# Patient Record
Sex: Male | Born: 1940 | Race: Black or African American | Hispanic: No | Marital: Married | Smoking: Former smoker
Health system: Southern US, Community
[De-identification: ages and names within clinical notes are randomized; demographics above are authoritative.]

## PROBLEM LIST (undated history)

## (undated) DIAGNOSIS — G629 Polyneuropathy, unspecified: Secondary | ICD-10-CM

## (undated) DIAGNOSIS — K253 Acute gastric ulcer without hemorrhage or perforation: Secondary | ICD-10-CM

## (undated) DIAGNOSIS — I214 Non-ST elevation (NSTEMI) myocardial infarction: Secondary | ICD-10-CM

## (undated) DIAGNOSIS — I471 Supraventricular tachycardia, unspecified: Secondary | ICD-10-CM

## (undated) DIAGNOSIS — I428 Other cardiomyopathies: Secondary | ICD-10-CM

## (undated) DIAGNOSIS — I639 Cerebral infarction, unspecified: Secondary | ICD-10-CM

## (undated) DIAGNOSIS — K2960 Other gastritis without bleeding: Secondary | ICD-10-CM

## (undated) DIAGNOSIS — B1920 Unspecified viral hepatitis C without hepatic coma: Secondary | ICD-10-CM

## (undated) DIAGNOSIS — I10 Essential (primary) hypertension: Secondary | ICD-10-CM

## (undated) DIAGNOSIS — R7989 Other specified abnormal findings of blood chemistry: Secondary | ICD-10-CM

## (undated) DIAGNOSIS — K922 Gastrointestinal hemorrhage, unspecified: Secondary | ICD-10-CM

## (undated) DIAGNOSIS — K219 Gastro-esophageal reflux disease without esophagitis: Secondary | ICD-10-CM

## (undated) DIAGNOSIS — F1020 Alcohol dependence, uncomplicated: Secondary | ICD-10-CM

## (undated) DIAGNOSIS — R42 Dizziness and giddiness: Secondary | ICD-10-CM

## (undated) DIAGNOSIS — B370 Candidal stomatitis: Secondary | ICD-10-CM

## (undated) DIAGNOSIS — F329 Major depressive disorder, single episode, unspecified: Secondary | ICD-10-CM

## (undated) DIAGNOSIS — D649 Anemia, unspecified: Secondary | ICD-10-CM

## (undated) DIAGNOSIS — F1110 Opioid abuse, uncomplicated: Secondary | ICD-10-CM

## (undated) DIAGNOSIS — D696 Thrombocytopenia, unspecified: Secondary | ICD-10-CM

## (undated) DIAGNOSIS — C220 Liver cell carcinoma: Secondary | ICD-10-CM

## (undated) DIAGNOSIS — K760 Fatty (change of) liver, not elsewhere classified: Secondary | ICD-10-CM

## (undated) DIAGNOSIS — I452 Bifascicular block: Secondary | ICD-10-CM

## (undated) DIAGNOSIS — K7460 Unspecified cirrhosis of liver: Secondary | ICD-10-CM

## (undated) DIAGNOSIS — T401X1A Poisoning by heroin, accidental (unintentional), initial encounter: Secondary | ICD-10-CM

## (undated) DIAGNOSIS — K2970 Gastritis, unspecified, without bleeding: Secondary | ICD-10-CM

## (undated) DIAGNOSIS — I82409 Acute embolism and thrombosis of unspecified deep veins of unspecified lower extremity: Secondary | ICD-10-CM

## (undated) DIAGNOSIS — R319 Hematuria, unspecified: Secondary | ICD-10-CM

## (undated) DIAGNOSIS — Z09 Encounter for follow-up examination after completed treatment for conditions other than malignant neoplasm: Secondary | ICD-10-CM

## (undated) DIAGNOSIS — R9431 Abnormal electrocardiogram [ECG] [EKG]: Secondary | ICD-10-CM

## (undated) DIAGNOSIS — R634 Abnormal weight loss: Secondary | ICD-10-CM

## (undated) DIAGNOSIS — M25561 Pain in right knee: Secondary | ICD-10-CM

## (undated) DIAGNOSIS — F1010 Alcohol abuse, uncomplicated: Secondary | ICD-10-CM

## (undated) DIAGNOSIS — K2901 Acute gastritis with bleeding: Secondary | ICD-10-CM

## (undated) DIAGNOSIS — T39395A Adverse effect of other nonsteroidal anti-inflammatory drugs [NSAID], initial encounter: Secondary | ICD-10-CM

## (undated) DIAGNOSIS — F1910 Other psychoactive substance abuse, uncomplicated: Secondary | ICD-10-CM

## (undated) DIAGNOSIS — K8020 Calculus of gallbladder without cholecystitis without obstruction: Secondary | ICD-10-CM

## (undated) HISTORY — DX: Calculus of gallbladder without cholecystitis without obstruction: K80.20

## (undated) HISTORY — PX: TONSILLECTOMY: SUR1361

## (undated) HISTORY — DX: Acute gastric ulcer without hemorrhage or perforation: K25.3

## (undated) HISTORY — DX: Poisoning by heroin, accidental (unintentional), initial encounter: T40.1X1A

## (undated) HISTORY — DX: Abnormal weight loss: R63.4

## (undated) HISTORY — DX: Opioid abuse, uncomplicated: F11.10

## (undated) HISTORY — DX: Gastritis, unspecified, without bleeding: K29.70

## (undated) HISTORY — DX: Candidal stomatitis: B37.0

## (undated) HISTORY — DX: Other cardiomyopathies: I42.8

## (undated) HISTORY — DX: Pain in right knee: M25.561

## (undated) HISTORY — DX: Gastrointestinal hemorrhage, unspecified: K92.2

## (undated) HISTORY — DX: Acute gastritis with bleeding: K29.01

## (undated) HISTORY — DX: Major depressive disorder, single episode, unspecified: F32.9

## (undated) HISTORY — DX: Cerebral infarction, unspecified: I63.9

## (undated) HISTORY — PX: CIRCUMCISION: SUR203

## (undated) HISTORY — DX: Hematuria, unspecified: R31.9

## (undated) HISTORY — DX: Bifascicular block: I45.2

## (undated) HISTORY — DX: Encounter for follow-up examination after completed treatment for conditions other than malignant neoplasm: Z09

## (undated) HISTORY — DX: Dizziness and giddiness: R42

## (undated) HISTORY — DX: Fatty (change of) liver, not elsewhere classified: K76.0

## (undated) HISTORY — DX: Other gastritis without bleeding: K29.60

## (undated) HISTORY — DX: Alcohol dependence, uncomplicated: F10.20

## (undated) HISTORY — DX: Acute embolism and thrombosis of unspecified deep veins of unspecified lower extremity: I82.409

---

## 2008-11-02 ENCOUNTER — Emergency Department (HOSPITAL_COMMUNITY): Admission: EM | Admit: 2008-11-02 | Discharge: 2008-11-02 | Payer: Self-pay | Admitting: Emergency Medicine

## 2011-10-20 ENCOUNTER — Emergency Department (HOSPITAL_COMMUNITY): Payer: Medicare Other

## 2011-10-20 ENCOUNTER — Encounter (HOSPITAL_COMMUNITY): Payer: Self-pay | Admitting: *Deleted

## 2011-10-20 ENCOUNTER — Emergency Department (HOSPITAL_COMMUNITY)
Admission: EM | Admit: 2011-10-20 | Discharge: 2011-10-20 | Disposition: A | Discharge status: Home / Self Care | Payer: Medicare Other | Attending: Emergency Medicine | Admitting: Emergency Medicine

## 2011-10-20 DIAGNOSIS — S0990XA Unspecified injury of head, initial encounter: Secondary | ICD-10-CM

## 2011-10-20 DIAGNOSIS — Y998 Other external cause status: Secondary | ICD-10-CM | POA: Insufficient documentation

## 2011-10-20 DIAGNOSIS — Y92009 Unspecified place in unspecified non-institutional (private) residence as the place of occurrence of the external cause: Secondary | ICD-10-CM | POA: Insufficient documentation

## 2011-10-20 DIAGNOSIS — S0180XA Unspecified open wound of other part of head, initial encounter: Secondary | ICD-10-CM | POA: Insufficient documentation

## 2011-10-20 DIAGNOSIS — S62609A Fracture of unspecified phalanx of unspecified finger, initial encounter for closed fracture: Secondary | ICD-10-CM

## 2011-10-20 DIAGNOSIS — R930 Abnormal findings on diagnostic imaging of skull and head, not elsewhere classified: Secondary | ICD-10-CM | POA: Insufficient documentation

## 2011-10-20 DIAGNOSIS — S0181XA Laceration without foreign body of other part of head, initial encounter: Secondary | ICD-10-CM

## 2011-10-20 DIAGNOSIS — S62639A Displaced fracture of distal phalanx of unspecified finger, initial encounter for closed fracture: Secondary | ICD-10-CM | POA: Insufficient documentation

## 2011-10-20 DIAGNOSIS — I10 Essential (primary) hypertension: Secondary | ICD-10-CM | POA: Insufficient documentation

## 2011-10-20 DIAGNOSIS — R079 Chest pain, unspecified: Secondary | ICD-10-CM | POA: Insufficient documentation

## 2011-10-20 DIAGNOSIS — W010XXA Fall on same level from slipping, tripping and stumbling without subsequent striking against object, initial encounter: Secondary | ICD-10-CM | POA: Insufficient documentation

## 2011-10-20 DIAGNOSIS — W19XXXA Unspecified fall, initial encounter: Secondary | ICD-10-CM

## 2011-10-20 DIAGNOSIS — G589 Mononeuropathy, unspecified: Secondary | ICD-10-CM | POA: Insufficient documentation

## 2011-10-20 DIAGNOSIS — IMO0002 Reserved for concepts with insufficient information to code with codable children: Secondary | ICD-10-CM | POA: Insufficient documentation

## 2011-10-20 DIAGNOSIS — T07XXXA Unspecified multiple injuries, initial encounter: Secondary | ICD-10-CM

## 2011-10-20 HISTORY — DX: Polyneuropathy, unspecified: G62.9

## 2011-10-20 MED ORDER — OXYCODONE HCL 5 MG PO TABS: 5.0000 mg | ORAL_TABLET | ORAL | Status: AC | PRN

## 2011-10-20 NOTE — ED Provider Notes (Signed)
71 year old male states that he lost his balance at home and fell hitting his head. He is also complaining of pain in his left thumb. He is neurologically intact, but CT does show evidence of facial fractures and a small subdural hematoma. As we discussed with neurosurgery, but I suspect that he will be able to be followed as an outpatient with repeat scan to ensure no expansion of bleed.  Dione Booze, MD 10/20/11 1356

## 2011-10-20 NOTE — ED Notes (Signed)
GNF:[id number]<YQ> Expected date:10/20/11<BR> Expected time:11:53 AM<BR> Means of arrival:Ambulance<BR> Comments:<BR> EMS 11 GC, 88 yom fall w lac to head Hemorrhage controlled

## 2011-10-20 NOTE — ED Notes (Signed)
etoh on board per ems.

## 2011-10-20 NOTE — ED Notes (Signed)
Res was noted to have ETOH on board and states he had one beer.  He was taken to Xray for films and the CT for Head CT

## 2011-10-20 NOTE — ED Provider Notes (Signed)
Medical screening examination/treatment/procedure(s) were conducted as a shared visit with non-physician practitioner(s) and myself.  I personally evaluated the patient during the encounter   Dione Booze, MD 10/20/11 [phone]

## 2011-10-20 NOTE — ED Notes (Signed)
Pt arrived fully immobilized by ems. Unable to perform ekg at this time due to being strapped on LSB. Will perform when removed from immobilization.

## 2011-10-20 NOTE — ED Provider Notes (Addendum)
History     CSN: [id number]  Arrival date & time 10/20/11  1204   First MD Initiated Contact with Patient 10/20/11 1219      Chief Complaint  Patient presents with  . Fall  . Head Laceration    (Consider location/radiation/quality/duration/timing/severity/associated sxs/prior treatment) HPI Comments: Patient with fall from standing while he was opening a door at home today.  Patient fell and hit left frontal part of forehead. He sustained a laceration to that area. Patient denies feeling dizzy, having chest pain/palpitations prior to falling. He denies loss of consciousness. Patient admits to drinking one beer earlier today. Denies blurred vision, vomiting, weakness in the extremities. Patient also fell onto left side and had some pain over left lateral ribs. No shortness of breath or difficulty breathing. Has abrasions to bilateral palms sustained trying to break his fall. Denies blood thinners.   Patient is a 71 y.o. male presenting with fall and scalp laceration. The history is provided by the patient.  Fall The accident occurred 1 to 2 hours ago. The fall occurred while walking. He landed on concrete. The volume of blood lost was minimal. The point of impact was the head. There was alcohol use involved in the accident. Pertinent negatives include no numbness, no nausea, no vomiting and no headaches. Treatment on scene includes a c-collar and a backboard. He has tried nothing for the symptoms.  Head Laceration Pertinent negatives include no arthralgias, headaches, myalgias, nausea, neck pain, numbness, rash, sore throat or vomiting.    Past Medical History  Diagnosis Date  . Neuropathy   . Hypertension     History reviewed. No pertinent past surgical history.  No family history on file.  History  Substance Use Topics  . Smoking status: Never Smoker   . Smokeless tobacco: Not on file  . Alcohol Use: Yes     "couple beers a day"      Review of Systems  Constitutional:  Negative for activity change.  HENT: Negative for hearing loss, nosebleeds, sore throat, rhinorrhea and neck pain.   Eyes: Negative for photophobia, pain, redness and visual disturbance.  Respiratory: Negative for shortness of breath.   Cardiovascular: Negative for palpitations.       Positive for rib pain  Gastrointestinal: Negative for nausea and vomiting.  Genitourinary: Negative for dysuria.  Musculoskeletal: Negative for myalgias, back pain and arthralgias.  Skin: Positive for wound. Negative for rash.  Neurological: Negative for dizziness, syncope, numbness and headaches.  Psychiatric/Behavioral: Negative for confusion.    Allergies  Review of patient's allergies indicates no known allergies.  Home Medications  No current outpatient prescriptions on file.  BP 125/74  Pulse 70  Temp(Src) 97.7 F (36.5 C) (Oral)  Resp 20  SpO2 95%  Physical Exam  Nursing note and vitals reviewed. Constitutional: He is oriented to person, place, and time. He appears well-developed and well-nourished.  HENT:  Head: Normocephalic. Head is with abrasion, with contusion and with laceration. Head is without raccoon's eyes and without Battle's sign.    Right Ear: Tympanic membrane, external ear and ear canal normal. No hemotympanum.  Left Ear: Tympanic membrane, external ear and ear canal normal. No hemotympanum.  Nose: No nasal deformity, septal deviation or nasal septal hematoma.  Mouth/Throat: Uvula is midline, oropharynx is clear and moist and mucous membranes are normal.       No point tenderness with palpation over the face or cheeks except in the area of left temple where there is an abrasion, no  depressions.  Eyes: Conjunctivae and EOM are normal. Pupils are equal, round, and reactive to light. Right eye exhibits no discharge. Left eye exhibits no discharge.       The proptosis, no hyphema. Full range of motion of both eyes in all directions. No pain with movement of eyes.  Neck: Normal  range of motion. Neck supple.       No cervical spine tenderness.  Cardiovascular: Normal rate, regular rhythm and normal heart sounds.   Pulmonary/Chest: Effort normal and breath sounds normal. He exhibits tenderness.       Left lateral rib tenderness to palpation. No skin signs of trauma.   Abdominal: Soft. Bowel sounds are normal. There is no tenderness. There is no rebound and no guarding.  Musculoskeletal: He exhibits no edema.       Bilateral abrasions to palms. No lower back or middle back tenderness to palpation.  Neurological: He is alert and oriented to person, place, and time. He has normal strength and normal reflexes. He displays no tremor. No cranial nerve deficit or sensory deficit. He exhibits normal muscle tone. Coordination normal. GCS eye subscore is 4. GCS verbal subscore is 5. GCS motor subscore is 6.  Skin: Skin is warm and dry.  Psychiatric: He has a normal mood and affect.    ED Course  Procedures (including critical care time)  Labs Reviewed - No data to display Dg Ribs Unilateral W/chest Left  10/20/2011  *RADIOLOGY REPORT*  Clinical Data: Fall today with pain on left  LEFT RIBS AND CHEST - 3+ VIEW  Comparison: None.  Findings: The cardiac silhouette, mediastinum, pulmonary vasculature are within normal limits.  Both lungs are clear.  There is also a subacute fracture of the posterior left eighth rib. No pneumothorax.  IMPRESSION: There is no evidence of acute rib fracture or pneumothorax.  Original Report Authenticated By: Brandon Melnick, M.D.   Dg Cervical Spine Complete  10/20/2011  *RADIOLOGY REPORT*  Clinical Data: Pain after fall  CERVICAL SPINE - COMPLETE 4+ VIEW  Comparison: None.  Findings: The odontoid is intact and the lateral masses are well- aligned.  The AP and lateral cervical alignment are normal. Prevertebral soft tissue stripe is within normal limits.  There is no evidence of fracture.  The oblique views reveal no evidence of facet joint dislocation  bilaterally.  IMPRESSION: There is no evidence of cervical spine fracture or dislocation.  Original Report Authenticated By: Brandon Melnick, M.D.   Ct Head Wo Contrast  10/20/2011  *RADIOLOGY REPORT*  Clinical Data: Head trauma and scalp laceration secondary to a fall.  CT HEAD WITHOUT CONTRAST  Technique:  Contiguous axial images were obtained from the base of the skull through the vertex without contrast.  Comparison: 11/02/2008  Findings: There is a tiny amount of subdural blood adjacent to the anterior inferior aspect of the left frontal lobe. Brain parenchyma is normal.  No mass lesion or acute infarction.  There is an air-fluid level in the left maxillary sinus and I suspect there are fractures of the lateral and anterior walls of the left maxillary sinus although the sinus is not completely visualized.  Suggestion of a hairline fracture of the left zygomatic arch.  The remainder of the skull is intact.   IMPRESSION: 1.  Small acute subdural hemorrhage adjacent to the anterior inferior aspect of the left frontal lobe. 2.  Probable fractures of the left maxillary sinus with an air- fluid level.  Probable hairline fracture of the left zygomatic  arch.  Original Report Authenticated By: Gwynn Burly, M.D.   Dg Hand Complete Left  10/20/2011  *RADIOLOGY REPORT*  Clinical Data: Pain, fall  LEFT HAND - COMPLETE 3+ VIEW  Comparison: None  Findings: Osseous demineralization. Joint spaces preserved. Minimally displaced fracture distal phalanx left middle finger with extension into DIP joint. Nondisplaced fracture mid aspect of distal phalanx left ring finger. No additional fracture, dislocation or bone destruction.  IMPRESSION: Fractures of the distal phalanges of the left ring and middle fingers as above.  Original Report Authenticated By: Lollie Marrow, M.D.   Dg Hand Complete Right  10/20/2011  *RADIOLOGY REPORT*  Clinical Data: Fall, injury  RIGHT HAND - COMPLETE 3+ VIEW  Comparison: None  Findings:  Osseous demineralization. Deformity distal fifth metacarpal likely old healed fracture. Joint spaces preserved. Mildly displaced fracture through mid aspect of distal phalanx right thumb. No articular extension. No additional fracture, dislocation or bone destruction.  IMPRESSION: Minimally displaced fracture through the mid aspect of distal phalanx right thumb. Likely old post-traumatic deformity of distal right fifth metacarpal.  Original Report Authenticated By: Lollie Marrow, M.D.     1. Head injury   2. Multiple closed fractures of fingers   3. Abrasions of multiple sites   4. Laceration of face   5. Fall     1:49 PM Patient seen and examine, removed from LSB with help of nurses.   1:49 PM Patient was discussed with Dione Booze, MD  4:17 PM patient is stable and is appropriate for discharge to home at this time. Patient's exam has remained unchanged during this time. Neurologically he continues to be intact without any deficits or changes.  C-collar removed, no neck pain. Full ROM in neck.   During the visit I spoke with Dr. Venetia Maxon of neurosurgery who stated no followup would be needed for patient's possible subdural bleed. I informed patient that he should be rechecked by his primary care doctor in 2 days and may need a repeat CT scan. I told the patient that if he could not see his primary care physician in 2 days he should return to the emergency department for repeat scan. Patient verbalized understanding and agreed with plan.  Patient was counseled on head injury precautions and symptoms that should indicate their return to the ED.  These include severe worsening headache, vision changes, confusion, loss of consciousness, trouble walking, nausea & vomiting, or weakness/tingling in extremities.    Patient counseled on use of narcotic pain medications. Counseled not to combine these medications with others containing tylenol. Urged not to drink alcohol, drive, or perform any other  activities that requires focus while taking these medications. The patient verbalizes understanding and agrees with the plan.  Patient was informed of possible facial fractures. ENT followup was given.  Patient was informed of finger fractures and splinting and buddy tape was performed orthopedic. Orthopedic hand followup given.  Patient counseled on wound care. Small laceration to left temporal area was prepared with tissue adhesive. Patient tolerated procedure well.  LACERATION REPAIR Performed by: Carolee Rota Authorized by: Carolee Rota Consent: Verbal consent obtained. Risks and benefits: risks, benefits and alternatives were discussed Consent given by: patient Patient identity confirmed: provided demographic data Wound explored, superficial and shallow.   Laceration Location: left temple  Laceration Length: 0.5 cm  No Foreign Bodies seen or palpated  Anesthesia: none   Amount of cleaning: standard using skin cleanser and gauze scrub.   Skin closure: tissue adhesive  Patient tolerance:  Patient tolerated the procedure well with no immediate complications.   MDM  Patient with mechanical fall. No concern for cardiogenic etiology.  CT scan shows possible subdural hematoma. Patient given instructions to return to the emergency department or followup with primary care physician in the next 48 hours. Patient is neurologically intact and has remained stable throughout his stay in the emergency department.  The patient is possible facial fractures. There is no orbital blowout or proptosis/vision change. There is no hyphema. Pain control and ENT followup indicated.  Patient with multiple finger fractures. Fingers immobilized. Orthopedic followup given. Pain control given.  Laceration repaired.  Patient counseled on signs and symptoms to return. Patient verbalizes understanding and agrees with plan.  Patient had been drinking alcohol but has not been clinically  intoxicated in any time.        Carolee Rota, PA 10/20/11 1627  Carolee Rota, PA 10/20/11 1641  Carolee Rota, [place] 10/20/11 1642

## 2011-10-20 NOTE — ED Notes (Signed)
ED PA in room and assessed patient and removed spine board with neck precautions and three other people one holding his head.  His back was checked and no obvious inj noted.  His head was cleaned as much as he tolerated and wet rags were applied to try and soften the blood on his head.

## 2011-10-20 NOTE — ED Provider Notes (Signed)
Medical screening examination/treatment/procedure(s) were conducted as a shared visit with non-physician practitioner(s) and myself.  I personally evaluated the patient during the encounter   Dione Booze, MD 10/20/11 [phone]

## 2011-10-20 NOTE — ED Notes (Signed)
Dermabond placed at the bedside. 

## 2011-10-20 NOTE — ED Notes (Addendum)
Pt was walking out of house, tripped and fell approx 2.5 feet onto concrete carport. Lac noted to L eyebrow. Possible lac to L head, unable to visualize at this time due to blood. Reported by ems that pt lost approx 50cc blood. Son witnessed and both son and pt denies LOC. ems cbg 78.

## 2012-06-04 ENCOUNTER — Encounter (HOSPITAL_COMMUNITY): Payer: Self-pay | Admitting: *Deleted

## 2012-06-04 ENCOUNTER — Inpatient Hospital Stay (HOSPITAL_COMMUNITY)
Admission: EM | Admit: 2012-06-04 | Discharge: 2012-06-11 | DRG: 602 | Disposition: A | Discharge status: Skilled Nursing Facility | Payer: Medicare Other | Attending: Internal Medicine | Admitting: Internal Medicine

## 2012-06-04 DIAGNOSIS — R3129 Other microscopic hematuria: Secondary | ICD-10-CM | POA: Diagnosis present

## 2012-06-04 DIAGNOSIS — J90 Pleural effusion, not elsewhere classified: Secondary | ICD-10-CM | POA: Diagnosis not present

## 2012-06-04 DIAGNOSIS — L0390 Cellulitis, unspecified: Secondary | ICD-10-CM | POA: Diagnosis present

## 2012-06-04 DIAGNOSIS — K769 Liver disease, unspecified: Secondary | ICD-10-CM

## 2012-06-04 DIAGNOSIS — K7460 Unspecified cirrhosis of liver: Secondary | ICD-10-CM

## 2012-06-04 DIAGNOSIS — B370 Candidal stomatitis: Secondary | ICD-10-CM | POA: Diagnosis present

## 2012-06-04 DIAGNOSIS — R319 Hematuria, unspecified: Secondary | ICD-10-CM

## 2012-06-04 DIAGNOSIS — I82409 Acute embolism and thrombosis of unspecified deep veins of unspecified lower extremity: Secondary | ICD-10-CM | POA: Diagnosis present

## 2012-06-04 DIAGNOSIS — K8020 Calculus of gallbladder without cholecystitis without obstruction: Secondary | ICD-10-CM | POA: Diagnosis present

## 2012-06-04 DIAGNOSIS — E43 Unspecified severe protein-calorie malnutrition: Secondary | ICD-10-CM | POA: Diagnosis present

## 2012-06-04 DIAGNOSIS — R933 Abnormal findings on diagnostic imaging of other parts of digestive tract: Secondary | ICD-10-CM | POA: Diagnosis present

## 2012-06-04 DIAGNOSIS — K7030 Alcoholic cirrhosis of liver without ascites: Secondary | ICD-10-CM | POA: Diagnosis present

## 2012-06-04 DIAGNOSIS — K2970 Gastritis, unspecified, without bleeding: Secondary | ICD-10-CM | POA: Diagnosis not present

## 2012-06-04 DIAGNOSIS — R634 Abnormal weight loss: Secondary | ICD-10-CM

## 2012-06-04 DIAGNOSIS — L02419 Cutaneous abscess of limb, unspecified: Principal | ICD-10-CM | POA: Diagnosis present

## 2012-06-04 DIAGNOSIS — B1920 Unspecified viral hepatitis C without hepatic coma: Secondary | ICD-10-CM

## 2012-06-04 DIAGNOSIS — Z79899 Other long term (current) drug therapy: Secondary | ICD-10-CM

## 2012-06-04 DIAGNOSIS — G589 Mononeuropathy, unspecified: Secondary | ICD-10-CM | POA: Diagnosis present

## 2012-06-04 DIAGNOSIS — J189 Pneumonia, unspecified organism: Secondary | ICD-10-CM | POA: Diagnosis not present

## 2012-06-04 DIAGNOSIS — D696 Thrombocytopenia, unspecified: Secondary | ICD-10-CM | POA: Diagnosis not present

## 2012-06-04 DIAGNOSIS — B1710 Acute hepatitis C without hepatic coma: Secondary | ICD-10-CM | POA: Diagnosis present

## 2012-06-04 DIAGNOSIS — L0291 Cutaneous abscess, unspecified: Secondary | ICD-10-CM

## 2012-06-04 DIAGNOSIS — I824Y9 Acute embolism and thrombosis of unspecified deep veins of unspecified proximal lower extremity: Secondary | ICD-10-CM | POA: Diagnosis not present

## 2012-06-04 DIAGNOSIS — D649 Anemia, unspecified: Secondary | ICD-10-CM | POA: Diagnosis not present

## 2012-06-04 DIAGNOSIS — F1010 Alcohol abuse, uncomplicated: Secondary | ICD-10-CM

## 2012-06-04 DIAGNOSIS — E876 Hypokalemia: Secondary | ICD-10-CM | POA: Diagnosis not present

## 2012-06-04 DIAGNOSIS — I10 Essential (primary) hypertension: Secondary | ICD-10-CM | POA: Diagnosis present

## 2012-06-04 DIAGNOSIS — K2990 Gastroduodenitis, unspecified, without bleeding: Secondary | ICD-10-CM | POA: Diagnosis not present

## 2012-06-04 DIAGNOSIS — F1020 Alcohol dependence, uncomplicated: Secondary | ICD-10-CM | POA: Diagnosis present

## 2012-06-04 DIAGNOSIS — D684 Acquired coagulation factor deficiency: Secondary | ICD-10-CM | POA: Diagnosis not present

## 2012-06-04 HISTORY — DX: Unspecified cirrhosis of liver: K74.60

## 2012-06-04 HISTORY — DX: Hematuria, unspecified: R31.9

## 2012-06-04 HISTORY — DX: Unspecified viral hepatitis C without hepatic coma: B19.20

## 2012-06-04 HISTORY — DX: Abnormal weight loss: R63.4

## 2012-06-04 LAB — CBC WITH DIFFERENTIAL/PLATELET
Basophils Absolute: 0.1 10*3/uL (ref 0.0–0.1)
Eosinophils Relative: 2 % (ref 0–5)
Lymphocytes Relative: 19 % (ref 12–46)
Lymphs Abs: 1.5 10*3/uL (ref 0.7–4.0)
MCV: 88.6 fL (ref 78.0–100.0)
Neutro Abs: 5.8 10*3/uL (ref 1.7–7.7)
Neutrophils Relative %: 72 % (ref 43–77)
Platelets: 152 10*3/uL (ref 150–400)
RBC: 4.3 MIL/uL (ref 4.22–5.81)
RDW: 14.5 % (ref 11.5–15.5)
WBC: 8.1 10*3/uL (ref 4.0–10.5)

## 2012-06-04 LAB — URINALYSIS, ROUTINE W REFLEX MICROSCOPIC
Ketones, ur: 15 mg/dL — AB
Specific Gravity, Urine: 1.022 (ref 1.005–1.030)
pH: 5.5 (ref 5.0–8.0)

## 2012-06-04 LAB — COMPREHENSIVE METABOLIC PANEL
ALT: 15 U/L (ref 0–53)
AST: 37 U/L (ref 0–37)
Alkaline Phosphatase: 61 U/L (ref 39–117)
CO2: 19 mEq/L (ref 19–32)
Calcium: 8.5 mg/dL (ref 8.4–10.5)
GFR calc non Af Amer: >90 mL/min (ref >90)
Potassium: 3.7 mEq/L (ref 3.5–5.1)
Sodium: 133 mEq/L — ABNORMAL LOW (ref 135–145)

## 2012-06-04 LAB — PROTIME-INR
INR: 1.37 (ref 0.00–1.49)
Prothrombin Time: 17.1 seconds — ABNORMAL HIGH (ref 11.6–15.2)

## 2012-06-04 MED ORDER — VANCOMYCIN HCL IN DEXTROSE 1-5 GM/200ML-% IV SOLN
1000.0000 mg | Freq: Once | INTRAVENOUS | Status: AC
  Administered 2012-06-04: 1000 mg via INTRAVENOUS
  Filled 2012-06-04: qty 200

## 2012-06-04 NOTE — ED Notes (Signed)
Patient transported to CT 

## 2012-06-04 NOTE — ED Provider Notes (Signed)
History     CSN: [id number]  Arrival date & time 06/04/12  1353   First MD Initiated Contact with Patient 06/04/12 2045      Chief Complaint  Patient presents with  . Leg Swelling    (Consider location/radiation/quality/duration/timing/severity/associated sxs/prior treatment) The history is provided by the patient and a friend.   patient here with worsening right lower extremity swelling and redness without fever or chills at home. Patient notes difficulty and bleeding secondary to the side of his leg. Notes decreased ability to take care of himself. Was discharged from the [facility] a week ago after admission for weakness without diagnosis. He denies any cough or shortness of breath. Denies any orthopnea. Symptoms are worse today relating and better with remaining still. No medications used for this prior to arrival  Past Medical History  Diagnosis Date  . Neuropathy   . Hypertension     History reviewed. No pertinent past surgical history.  No family history on file.  History  Substance Use Topics  . Smoking status: Never Smoker   . Smokeless tobacco: Not on file  . Alcohol Use: Yes     "couple beers a day"      Review of Systems  All other systems reviewed and are negative.    Allergies  Review of patient's allergies indicates no known allergies.  Home Medications   Current Outpatient Rx  Name Route Sig Dispense Refill  . FUROSEMIDE 20 MG PO TABS Oral Take 20 mg by mouth daily.    Marland Kitchen GABAPENTIN PO Oral Take 1 capsule by mouth 3 (three) times daily. Pt does not know the dosage of medication. Patient uses the [place] [facility]. Cant confirm dosage.    Marland Kitchen SYNTHROID PO Oral Take 1 tablet by mouth daily. Pt doesn't know dose of medication. He uses the [place] pharmacy so pharmacy cant confirm dosage.    Marland Kitchen OMEPRAZOLE 20 MG PO CPDR Oral Take 20 mg by mouth daily.    . TRAMADOL HCL 50 MG PO TABS Oral Take 50 mg by mouth every 6 (six) hours as needed. pain    . TRAZODONE HCL 100  MG PO TABS Oral Take 100 mg by mouth at bedtime.      BP 113/73  Pulse 92  Temp 97.5 F (36.4 C) (Oral)  Resp 16  SpO2 100%  Physical Exam  Nursing note and vitals reviewed. Constitutional: He is oriented to person, place, and time. He appears well-developed and well-nourished.  Non-toxic appearance. No distress.  HENT:  Head: Normocephalic and atraumatic.  Eyes: Conjunctivae, EOM and lids are normal. Pupils are equal, round, and reactive to light.  Neck: Normal range of motion. Neck supple. No tracheal deviation present. No mass present.  Cardiovascular: Normal rate, regular rhythm and normal heart sounds.  Exam reveals no gallop.   No murmur heard. Pulmonary/Chest: Effort normal and breath sounds normal. No stridor. No respiratory distress. He has no decreased breath sounds. He has no wheezes. He has no rhonchi. He has no rales.  Abdominal: Soft. Normal appearance and bowel sounds are normal. He exhibits no distension. There is no tenderness. There is no rebound and no CVA tenderness.  Musculoskeletal: Normal range of motion. He exhibits no edema and no tenderness.       Right lower extremity with 3+ edema with erythema from the knee down to the ankle. Some weeping noted. Warm to the touch. No crepitus. Dorsalis pedis pulse palpable.  Neurological: He is alert and oriented to person,  place, and time. He has normal strength. No cranial nerve deficit or sensory deficit. GCS eye subscore is 4. GCS verbal subscore is 5. GCS motor subscore is 6.  Skin: Skin is warm and dry. No abrasion and no rash noted.  Psychiatric: He has a normal mood and affect. His speech is normal and behavior is normal.    ED Course  Procedures (including critical care time)  Labs Reviewed  CBC WITH DIFFERENTIAL - Abnormal; Notable for the following:    HCT 38.1 (*)     All other components within normal limits  COMPREHENSIVE METABOLIC PANEL - Abnormal; Notable for the following:    Sodium 133 (*)      Glucose, Bld 110 (*)     BUN 4 (*)     Albumin 2.4 (*)     All other components within normal limits  URINALYSIS, ROUTINE W REFLEX MICROSCOPIC   No results found.   No diagnosis found.    MDM  Patient's right lower extremity with cellulitis. Will start patient on vancomycin and will be admitted to the hospital        Toy Baker, MD 06/04/12 2138

## 2012-06-04 NOTE — H&P (Signed)
PCP:  Nathan Frank   Chief Complaint:   Leg swelling  HPI: Nathan Frank is a 71 y.o. male   has a past medical history of Neuropathy; Hypertension; and Hepatitis C.   Presented with history of hepatitis C. with liver disease as well as alcohol abuse followed by Nathan Frank For the past 2 days his right leg has been swollen severely with skin broken and leaking fluid. NO fever or chills.NO shortness of breath. He has hx of hep C and has been an alcoholic for decades. HE wants to quit drinking denies hx of DT's. He notes easy bruising. NO blood in stools no hematemesis.  HE had EGD in the past negative for varices per patient. He have had leg swelling off and on for long time. Reports at some point he used to be on spironolactone but not sure if he is now. HE has been losing a lot of weight and has no appetite. His last servalence [place] has been a year ago.   Review of Systems:    Pertinent positives include: weight loss  loss of appetite,  Bilateral lower extremity swelling   Constitutional:  No weight loss, night sweats, Fevers, chills, fatigue, HEENT:  No headaches, Difficulty swallowing,Tooth/dental problems,Sore throat,  No sneezing, itching, ear ache, nasal congestion, post nasal drip,  Cardio-vascular:  No chest pain, Orthopnea, PND, anasarca, dizziness, palpitations.no GI:  No heartburn, indigestion, abdominal pain, nausea, vomiting, diarrhea, change in bowel habits,melena, blood in stool, hematemesis Resp:  no shortness of breath at rest. No dyspnea on exertion, No excess mucus, no productive cough, No non-productive cough, No coughing up of blood.No change in color of mucus.No wheezing. Skin:  no rash or lesions. No jaundice GU:  no dysuria, change in color of urine, no urgency or frequency. No straining to urinate.  No flank pain.  Musculoskeletal:  No joint pain or no joint swelling. No decreased range of motion. No back pain.  Psych:  No change in mood or  affect. No depression or anxiety. No memory loss.  Neuro: no localizing neurological complaints, no tingling, no weakness, no double vision, no gait abnormality, no slurred speech, no confusion  Otherwise ROS are negative except for above, 10 systems were reviewed  Past Medical History: Past Medical History  Diagnosis Date  . Neuropathy   . Hypertension   . Hepatitis C    History reviewed. No pertinent past surgical history.   Medications: Prior to Admission medications   Medication Sig Start Date End Date Taking? Authorizing Provider  furosemide (LASIX) 20 MG tablet Take 20 mg by mouth daily.   Yes Historical Provider, MD  GABAPENTIN PO Take 1 capsule by mouth 3 (three) times daily. Pt does not know the dosage of medication. Patient uses the [place] pharmacy. Cant confirm dosage.   Yes Historical Provider, MD  Levothyroxine Sodium (SYNTHROID PO) Take 1 tablet by mouth daily. Pt doesn't know dose of medication. He uses the [place] pharmacy so pharmacy cant confirm dosage.   Yes Historical Provider, MD  omeprazole (PRILOSEC) 20 MG capsule Take 20 mg by mouth daily.   Yes Historical Provider, MD  traMADol (ULTRAM) 50 MG tablet Take 50 mg by mouth every 6 (six) hours as needed. pain   Yes Historical Provider, MD  traZODone (DESYREL) 100 MG tablet Take 100 mg by mouth at bedtime.   Yes Historical Provider, MD    Allergies:  No Known Allergies  Social History:  Ambulatory walker cane Lives at   [facility]  alone   reports that he has never smoked. He does not have any smokeless tobacco history on file. He reports that he drinks alcohol. He reports that he does not use illicit drugs.   Family History: family history includes Prostate cancer in his brother and Stroke in his father.    Physical Exam: Patient Vitals for the past 24 hrs:  BP Temp Temp src Pulse Resp SpO2  06/04/12 2120 119/75 mmHg 98.3 F (36.8 C) Oral - 16  100 %  06/04/12 1923 113/73 mmHg 97.5 F (36.4 C) Oral 92  16  100 %    06/04/12 1400 108/82 mmHg 98.1 F (36.7 C) Oral 100  18  100 %    1. General:  in No Acute distress cachectic appearing 2. Psychological: Alert and  Oriented 3. Head/ENT:    Dry Mucous Membranes                          Head Non traumatic, neck supple                         Poor Dentition 4. SKIN: decreased Skin turgor,  Skin clean Dry over right tibial area skin is broken due to tense edema, warmth present serosanguineous discharge present 5. Heart: Regular rate and rhythm no Murmur, Rub or gallop 6. Lungs: Clear to auscultation bilaterally, no wheezes or crackles   7. Abdomen: Soft, non-tender, slightly distended 8. Lower extremities: no clubbing, cyanosis, right 3+ left 1+ edema 9. Neurologically Grossly intact, moving all 4 extremities equally very mild tremor no asterixis 10. MSK: Normal range of motion  body mass index is unknown because there is no height or weight on file.   Labs on Admission:   [facility] 06/04/12 1455  NA 133*  K 3.7  CL 102  CO2 19  GLUCOSE 110*  BUN 4*  CREATININE 0.61  CALCIUM 8.5  MG --  PHOS --    Basename 06/04/12 1455  AST 37  ALT 15  ALKPHOS 61  BILITOT 0.8  PROT 7.1  ALBUMIN 2.4*   No results found for this basename: LIPASE:2,AMYLASE:2 in the last 72 hours  Basename 06/04/12 1455  WBC 8.1  NEUTROABS 5.8  HGB 13.1  HCT 38.1*  MCV 88.6  PLT 152   No results found for this basename: CKTOTAL:3,CKMB:3,CKMBINDEX:3,TROPONINI:3 in the last 72 hours No results found for this basename: TSH,T4TOTAL,FREET3,T3FREE,THYROIDAB in the last 72 hours No results found for this basename: VITAMINB12:2,FOLATE:2,FERRITIN:2,TIBC:2,IRON:2,RETICCTPCT:2 in the last 72 hours No results found for this basename: HGBA1C    CrCl is unknown because there is no height on file for the current visit. ABG No results found for this basename: phart, pco2, po2, hco3, tco2, acidbasedef, o2sat     No results found for this basename: DDIMER     ST&T Change:    UA no evidence of infection, hematuria present   Cultures: No results found for this basename: sdes, specrequest, cult, reptstatus       Radiological Exams on Admission: No results found.  Chart has been reviewed  Assessment/Plan  This is 71 year old gentleman a history of liver disease due to hepatitis C and alcohol abuse here for progressive low extremity edema with right lower extremity likely affected by cellulitis  Present on Admission:  .Cellulitis - in the setting of severe low extremity edema likely secondary to hypoalbuminemia. But will treat with vancomycin given his likelihood of secondary infection. Given  that there is a disparity in size of low extremities to obtain Dopplers to rule out DVT  .Alcohol abuse patient is interested in quitting the rectocele protocol  -  .Weight loss   -  patient is at high risk for malignancy we'll obtain ultrasound of the liver to evaluate for hepatocellular cancer and carcinoma given history of hepatitis C, also he has hematurea should be further evaluated .Hepatitis C - patient is currently not being treated second alcohol abuse  .Liver disease - MELD score of 4, is reassuring, have spoken to him about importance of quitting drinking.  .Hematuria - will repeat UA if persists he may benefit from and and urology work up, ordered US of the abdomen  to start with but further imaging may be needed    Prophylaxis: hep [place], Protonix  CODE STATUS: FULL CODE  Other plan as per orders.  I have spent a total of 60 min on this admission  Ranesha Val 06/04/2012, 10:36 PM

## 2012-06-04 NOTE — ED Notes (Signed)
[facility] with bilateral leg swelling and worse to right LEG with redness and draining.  Pt is pale and inner eyelids pale, denies blood loss anywhere.  Pt knows year but not month

## 2012-06-05 ENCOUNTER — Inpatient Hospital Stay (HOSPITAL_COMMUNITY): Payer: Medicare Other

## 2012-06-05 DIAGNOSIS — M79609 Pain in unspecified limb: Secondary | ICD-10-CM

## 2012-06-05 DIAGNOSIS — B370 Candidal stomatitis: Secondary | ICD-10-CM

## 2012-06-05 DIAGNOSIS — M7989 Other specified soft tissue disorders: Secondary | ICD-10-CM

## 2012-06-05 HISTORY — DX: Candidal stomatitis: B37.0

## 2012-06-05 LAB — CREATININE, URINE, RANDOM: Creatinine, Urine: 197.98 mg/dL

## 2012-06-05 LAB — HIV ANTIBODY (ROUTINE TESTING W REFLEX): HIV: NONREACTIVE

## 2012-06-05 LAB — COMPREHENSIVE METABOLIC PANEL
BUN: 4 mg/dL — ABNORMAL LOW (ref 6–23)
CO2: 23 mEq/L (ref 19–32)
Calcium: 8.1 mg/dL — ABNORMAL LOW (ref 8.4–10.5)
Creatinine, Ser: 0.57 mg/dL (ref 0.50–1.35)
GFR calc Af Amer: >90 mL/min (ref >90)
GFR calc non Af Amer: >90 mL/min (ref >90)
Glucose, Bld: 91 mg/dL (ref 70–99)
Sodium: 137 mEq/L (ref 135–145)
Total Protein: 5.7 g/dL — ABNORMAL LOW (ref 6.0–8.3)

## 2012-06-05 LAB — CBC
HCT: 23.1 % — ABNORMAL LOW (ref 39.0–52.0)
Hemoglobin: 8.2 g/dL — ABNORMAL LOW (ref 13.0–17.0)
WBC: 3.9 10*3/uL — ABNORMAL LOW (ref 4.0–10.5)

## 2012-06-05 LAB — MAGNESIUM: Magnesium: 1.1 mg/dL — ABNORMAL LOW (ref 1.5–2.5)

## 2012-06-05 LAB — PREALBUMIN: Prealbumin: 4.9 mg/dL — ABNORMAL LOW (ref 17.0–34.0)

## 2012-06-05 LAB — AFP TUMOR MARKER: AFP-Tumor Marker: 3.5 ng/mL (ref 0.0–8.0)

## 2012-06-05 LAB — NA AND K (SODIUM & POTASSIUM), RAND UR: Sodium, Ur: <10 mEq/L

## 2012-06-05 MED ORDER — IOHEXOL 300 MG/ML  SOLN
80.0000 mL | Freq: Once | INTRAMUSCULAR | Status: AC | PRN
  Administered 2012-06-05: 80 mL via INTRAVENOUS

## 2012-06-05 MED ORDER — LEVOTHYROXINE SODIUM 50 MCG PO TABS
50.0000 ug | ORAL_TABLET | Freq: Every day | ORAL | Status: DC
  Administered 2012-06-05 – 2012-06-11 (×7): 50 ug via ORAL
  Filled 2012-06-05 (×9): qty 1

## 2012-06-05 MED ORDER — SODIUM CHLORIDE 0.9 % IJ SOLN
3.0000 mL | Freq: Two times a day (BID) | INTRAMUSCULAR | Status: DC
  Administered 2012-06-07 – 2012-06-10 (×5): 3 mL via INTRAVENOUS

## 2012-06-05 MED ORDER — MORPHINE SULFATE 2 MG/ML IJ SOLN
2.0000 mg | INTRAMUSCULAR | Status: DC | PRN
  Administered 2012-06-09: 2 mg via INTRAVENOUS
  Filled 2012-06-05: qty 1

## 2012-06-05 MED ORDER — SODIUM CHLORIDE 0.9 % IV SOLN: 250.0000 mL | INTRAVENOUS | Status: DC | PRN

## 2012-06-05 MED ORDER — LORAZEPAM 1 MG PO TABS
1.0000 mg | ORAL_TABLET | Freq: Four times a day (QID) | ORAL | Status: AC | PRN
  Administered 2012-06-05 – 2012-06-07 (×2): 1 mg via ORAL
  Filled 2012-06-05 (×4): qty 1

## 2012-06-05 MED ORDER — TRAZODONE HCL 100 MG PO TABS
100.0000 mg | ORAL_TABLET | Freq: Every day | ORAL | Status: DC
Start: 2012-06-05 — End: 2012-06-11
  Administered 2012-06-05 – 2012-06-10 (×7): 100 mg via ORAL
  Filled 2012-06-05 (×8): qty 1

## 2012-06-05 MED ORDER — HEPARIN BOLUS VIA INFUSION
2500.0000 [IU] | Freq: Once | INTRAVENOUS | Status: DC
  Filled 2012-06-05: qty 2500

## 2012-06-05 MED ORDER — FUROSEMIDE 20 MG PO TABS
20.0000 mg | ORAL_TABLET | Freq: Two times a day (BID) | ORAL | Status: DC
  Administered 2012-06-05 – 2012-06-10 (×3): 20 mg via ORAL
  Filled 2012-06-05 (×16): qty 1

## 2012-06-05 MED ORDER — PIPERACILLIN-TAZOBACTAM 3.375 G IVPB
3.3750 g | Freq: Three times a day (TID) | INTRAVENOUS | Status: DC
  Administered 2012-06-05 – 2012-06-09 (×12): 3.375 g via INTRAVENOUS
  Filled 2012-06-05 (×15): qty 50

## 2012-06-05 MED ORDER — ONDANSETRON HCL 4 MG PO TABS: 4.0000 mg | ORAL_TABLET | Freq: Four times a day (QID) | ORAL | Status: DC | PRN

## 2012-06-05 MED ORDER — HEPARIN BOLUS VIA INFUSION
3000.0000 [IU] | Freq: Once | INTRAVENOUS | Status: DC
  Filled 2012-06-05: qty 3000

## 2012-06-05 MED ORDER — THIAMINE HCL 100 MG/ML IJ SOLN
100.0000 mg | Freq: Every day | INTRAMUSCULAR | Status: DC
  Filled 2012-06-05 (×7): qty 1

## 2012-06-05 MED ORDER — LORAZEPAM 1 MG PO TABS
0.0000 mg | ORAL_TABLET | Freq: Four times a day (QID) | ORAL | Status: AC
  Administered 2012-06-05: 1 mg via ORAL

## 2012-06-05 MED ORDER — FOLIC ACID 1 MG PO TABS
1.0000 mg | ORAL_TABLET | Freq: Every day | ORAL | Status: DC
  Administered 2012-06-05 – 2012-06-11 (×7): 1 mg via ORAL
  Filled 2012-06-05 (×8): qty 1

## 2012-06-05 MED ORDER — HEPARIN (PORCINE) IN NACL 100-0.45 UNIT/ML-% IJ SOLN
1250.0000 [IU]/h | INTRAMUSCULAR | Status: DC
  Administered 2012-06-05: 1000 [IU]/h via INTRAVENOUS
  Administered 2012-06-06: 1250 [IU]/h via INTRAVENOUS
  Filled 2012-06-05 (×3): qty 250

## 2012-06-05 MED ORDER — VITAMIN B-1 100 MG PO TABS
100.0000 mg | ORAL_TABLET | Freq: Every day | ORAL | Status: DC
  Administered 2012-06-05 – 2012-06-11 (×7): 100 mg via ORAL
  Filled 2012-06-05 (×7): qty 1

## 2012-06-05 MED ORDER — GUAIFENESIN-DM 100-10 MG/5ML PO SYRP: 5.0000 mL | ORAL_SOLUTION | ORAL | Status: DC | PRN

## 2012-06-05 MED ORDER — VANCOMYCIN HCL 1000 MG IV SOLR
750.0000 mg | Freq: Two times a day (BID) | INTRAVENOUS | Status: DC
  Administered 2012-06-05 – 2012-06-08 (×8): 750 mg via INTRAVENOUS
  Filled 2012-06-05 (×10): qty 750

## 2012-06-05 MED ORDER — SODIUM CHLORIDE 0.9 % IJ SOLN: 3.0000 mL | INTRAMUSCULAR | Status: DC | PRN

## 2012-06-05 MED ORDER — DOCUSATE SODIUM 100 MG PO CAPS
100.0000 mg | ORAL_CAPSULE | Freq: Two times a day (BID) | ORAL | Status: DC
  Administered 2012-06-05 – 2012-06-11 (×14): 100 mg via ORAL
  Filled 2012-06-05 (×14): qty 1

## 2012-06-05 MED ORDER — HEPARIN SODIUM (PORCINE) 5000 UNIT/ML IJ SOLN
5000.0000 [IU] | Freq: Three times a day (TID) | INTRAMUSCULAR | Status: DC
  Administered 2012-06-05 (×2): 5000 [IU] via SUBCUTANEOUS
  Filled 2012-06-05 (×4): qty 1

## 2012-06-05 MED ORDER — PANTOPRAZOLE SODIUM 40 MG PO TBEC
40.0000 mg | DELAYED_RELEASE_TABLET | Freq: Every day | ORAL | Status: DC
  Administered 2012-06-05 – 2012-06-11 (×7): 40 mg via ORAL
  Filled 2012-06-05 (×7): qty 1

## 2012-06-05 MED ORDER — ADULT MULTIVITAMIN W/MINERALS CH
1.0000 | ORAL_TABLET | Freq: Every day | ORAL | Status: DC
Start: 2012-06-05 — End: 2012-06-11
  Administered 2012-06-05 – 2012-06-11 (×7): 1 via ORAL
  Filled 2012-06-05 (×7): qty 1

## 2012-06-05 MED ORDER — ALBUTEROL SULFATE (5 MG/ML) 0.5% IN NEBU: 2.5000 mg | INHALATION_SOLUTION | RESPIRATORY_TRACT | Status: DC | PRN

## 2012-06-05 MED ORDER — FLUCONAZOLE 100 MG PO TABS
100.0000 mg | ORAL_TABLET | Freq: Every day | ORAL | Status: AC
  Administered 2012-06-05 – 2012-06-11 (×7): 100 mg via ORAL
  Filled 2012-06-05 (×8): qty 1

## 2012-06-05 MED ORDER — IOHEXOL 300 MG/ML  SOLN
20.0000 mL | INTRAMUSCULAR | Status: AC
  Administered 2012-06-05 (×2): 20 mL via ORAL

## 2012-06-05 MED ORDER — LORAZEPAM 1 MG PO TABS: 0.0000 mg | ORAL_TABLET | Freq: Two times a day (BID) | ORAL | Status: AC

## 2012-06-05 MED ORDER — LORAZEPAM 2 MG/ML IJ SOLN: 1.0000 mg | Freq: Four times a day (QID) | INTRAMUSCULAR | Status: AC | PRN

## 2012-06-05 MED ORDER — SPIRONOLACTONE 12.5 MG HALF TABLET
12.5000 mg | ORAL_TABLET | Freq: Every day | ORAL | Status: DC
  Administered 2012-06-05 – 2012-06-11 (×7): 12.5 mg via ORAL
  Filled 2012-06-05 (×7): qty 1

## 2012-06-05 MED ORDER — ALUM & MAG HYDROXIDE-SIMETH 200-200-20 MG/5ML PO SUSP: 30.0000 mL | Freq: Four times a day (QID) | ORAL | Status: DC | PRN

## 2012-06-05 MED ORDER — ONDANSETRON HCL 4 MG/2ML IJ SOLN: 4.0000 mg | Freq: Four times a day (QID) | INTRAMUSCULAR | Status: DC | PRN

## 2012-06-05 MED ORDER — TRAMADOL HCL 50 MG PO TABS
50.0000 mg | ORAL_TABLET | Freq: Four times a day (QID) | ORAL | Status: DC | PRN
  Administered 2012-06-06 – 2012-06-11 (×6): 50 mg via ORAL
  Filled 2012-06-05 (×6): qty 1

## 2012-06-05 NOTE — Progress Notes (Signed)
ANTIBIOTIC CONSULT NOTE - INITIAL  Pharmacy Consult for vancomycin Indication: cellulitis  No Known Allergies  Patient Measurements: Weight: 156 lb (70.761 kg)  Vital Signs: Temp: 98.7 F (37.1 C) (09/01 0010) Temp src: Oral (09/01 0010) BP: 117/70 mmHg (09/01 0010) Pulse Rate: 90  (09/01 0010)  Labs:  Basename 06/04/12 1455  WBC 8.1  HGB 13.1  PLT 152  LABCREA --  CREATININE 0.61    Microbiology: No results found for this or any previous visit (from the past 720 hour(s)).  Medical History: Past Medical History  Diagnosis Date  . Neuropathy   . Hypertension   . Hepatitis C     Medications:  Prescriptions prior to admission  Medication Sig Dispense Refill  . furosemide (LASIX) 20 MG tablet Take 20 mg by mouth daily.      Marland Kitchen GABAPENTIN PO Take 1 capsule by mouth 3 (three) times daily. Pt does not know the dosage of medication. Patient uses the [place] pharmacy. Cant confirm dosage.      . Levothyroxine Sodium (SYNTHROID PO) Take 1 tablet by mouth daily. Pt doesn't know dose of medication. He uses the [place] pharmacy so pharmacy cant confirm dosage.      Marland Kitchen omeprazole (PRILOSEC) 20 MG capsule Take 20 mg by mouth daily.      . traMADol (ULTRAM) 50 MG tablet Take 50 mg by mouth every 6 (six) hours as needed. pain      . traZODone (DESYREL) 100 MG tablet Take 100 mg by mouth at bedtime.       Scheduled:    . docusate sodium  100 mg Oral BID  . folic acid  1 mg Oral Daily  . furosemide  20 mg Oral BID  . heparin  5,000 Units Subcutaneous Q8H  . levothyroxine  50 mcg Oral QAC breakfast  . LORazepam  0-4 mg Oral Q6H   Followed by  . LORazepam  0-4 mg Oral Q12H  . multivitamin with minerals  1 tablet Oral Daily  . pantoprazole  40 mg Oral Q1200  . sodium chloride  3 mL Intravenous Q12H  . sodium chloride  3 mL Intravenous Q12H  . spironolactone  12.5 mg Oral Daily  . thiamine  100 mg Oral Daily   Or  . thiamine  100 mg Intravenous Daily  . traZODone  100 mg Oral QHS  .  vancomycin  750 mg Intravenous Q12H  . vancomycin  1,000 mg Intravenous Once    Assessment: 71yo male c/o LE swelling likely due to hypoabuminemia, to be tx'd w/ ABX in case infection present.  Goal of Therapy:  Vancomycin trough level 10-15 mcg/ml  Plan:  Rec'd vanc 1g in ED; will continue with vancomycin 750mg  IV Q12H and monitor CBC, Cx, levels prn.  Colleen Can PharmD BCPS 06/05/2012,12:32 AM

## 2012-06-05 NOTE — Progress Notes (Signed)
[facility] PROGRESS NOTE  Nathan Frank ZOX:[id number] DOB: 06-21-1941 DOA: 06/04/2012 PCP: Pcp Not In System  Assessment/Plan: Active Problems:  Cellulitis  Alcohol abuse  Weight loss  Hepatitis C  Liver disease  Hematuria    1. RLE Cellulitis: Patient presented with increased swelling, pain and redness of RLE, in the setting of severe low extremity edema, likely secondary to hypoalbuminemia. Commenced on iv Vancomycin, now day#2. Given disparity in ciorcumference of lower extremities, have ordered venous doppler to rule out DVT. For broader coverage, have added iv Zosyn to antibiotic regimen.   2. Alcohol abuse: Patient is a chronic alcoholic, and has expressed interest in quitting. He has been placed on CIWA protocol, vitamin supplements and counseled.  3. Weight loss: Patient has lost about 20 pounds in the last one month. He has a known history of chronic hepatitis C/chronic liver disease. At this point, it is not clear whether he is actually cirrhotic. He is certainly at risk for malignancy this will be evaluated with ultrasound of the liver and AFP. Tumor marker. Will likely benefit from abdominal CT scan. Per patient, he has had EGD in the past, which was negative for varices.  4. Hepatitis C: Patient is currently not being treated secondary to alcohol abuse.  5. Hematuria: Urinalysis revealed microhematuria. Will repeat urinalysis, and if persists he may benefit from urology work up. Abdominal ultrasound has been ordered.  6. Oral thrush: This was an incidental finding on physical examination. Will treat with a 7-day course of Diflucan.  7. Severe protein-calorie malnutrition: Patient appears underweight, and has albumin of 2.4, as well as peripheral edema, consistent with malnutrition. Will request nutritionist consultation.    Code Status: Full Code.  Family Communication:  Disposition Plan: To be determined.    Brief narrative:  71 y.o. male with history of  Neuropathy, Hypertension, Hepatitis C and chronic alcoholism, presenting with intermittent bilateral lower extremity swelling, much worse in right leg over the past 2 days with broken skin and oozing fluid, but no fever, chills or dyspnea. He has lost a lot of weight and has no appetite.  Consultants:  N/A.  Procedures:  Pending.   Antibiotics:  Vancomycin started 06/04/12.   Zosyn started 06/05/12.  HPI/Subjective: No new issues.   Objective: Vital signs in last 24 hours: Temp:  [97.5 F (36.4 C)-98.7 F (37.1 C)] 98.2 F (36.8 C) (09/01 0414) Pulse Rate:  [90-100] 91  (09/01 0414) Resp:  [15-18] 15  (09/01 0414) BP: (93-119)/(58-82) 93/58 mmHg (09/01 0414) SpO2:  [100 %] 100 % (09/01 0414) Weight:  [70.761 kg (156 lb)] 70.761 kg (156 lb) (09/01 0010) Weight change:  Last BM Date: 06/03/12  Intake/Output from previous day:   Total I/O In: -  Out: 200 [Urine:200]   Physical Exam: General: Comfortable, alert, communicative, fully oriented, not short of breath at rest.  HEENT:  No clinical pallor, no jaundice, no conjunctival injection or discharge. Hydration appear fair. Patient has oral thrush.  NECK:  Supple, JVP not seen, no carotid bruits, no palpable lymphadenopathy, no palpable goiter. CHEST:  Clinically clear to auscultation, no wheezes, no crackles. HEART:  Sounds 1 and 2 heard, normal, regular, no murmurs. ABDOMEN:  Full, soft, no scars, non-tender, no palpable organomegaly, no palpable masses, normal bowel sounds. No clinical ascites.  GENITALIA:  Not examined. LOWER EXTREMITIES:  Moderate pitting edema, palpable peripheral pulses, RLE is more swollen than left, has excoriated/denuded skin, with oozing fluid and erythema from just below knee to just above  ankle. MUSCULOSKELETAL SYSTEM:  Generalized osteoarthritic changes, otherwise, normal. CENTRAL NERVOUS SYSTEM:  No focal neurologic deficit on gross examination.  Lab Results:  Basename 06/04/12 1455  WBC  8.1  HGB 13.1  HCT 38.1*  PLT 152    Basename 06/04/12 1455  NA 133*  K 3.7  CL 102  CO2 19  GLUCOSE 110*  BUN 4*  CREATININE 0.61  CALCIUM 8.5   No results found for this or any previous visit (from the past 240 hour(s)).   Studies/Results: No results found.  Medications: Scheduled Meds:   . docusate sodium  100 mg Oral BID  . folic acid  1 mg Oral Daily  . furosemide  20 mg Oral BID  . heparin  5,000 Units Subcutaneous Q8H  . levothyroxine  50 mcg Oral QAC breakfast  . LORazepam  0-4 mg Oral Q6H   Followed by  . LORazepam  0-4 mg Oral Q12H  . multivitamin with minerals  1 tablet Oral Daily  . pantoprazole  40 mg Oral Q1200  . sodium chloride  3 mL Intravenous Q12H  . sodium chloride  3 mL Intravenous Q12H  . spironolactone  12.5 mg Oral Daily  . thiamine  100 mg Oral Daily   Or  . thiamine  100 mg Intravenous Daily  . traZODone  100 mg Oral QHS  . vancomycin  750 mg Intravenous Q12H  . vancomycin  1,000 mg Intravenous Once   Continuous Infusions:  PRN Meds:.sodium chloride, albuterol, alum & mag hydroxide-simeth, guaiFENesin-dextromethorphan, LORazepam, LORazepam, morphine injection, ondansetron (ZOFRAN) IV, ondansetron, sodium chloride, traMADol    LOS: 1 day   Nathan Frank,CHRISTOPHER  [facility] Pager [phone]. If 8PM-8AM, please contact night-coverage at [email or web address], password [facility] 06/05/2012, 8:59 AM  LOS: 1 day

## 2012-06-05 NOTE — Progress Notes (Signed)
Pharmacy: To add zosyn to vancomycin for RLE cellulitis.  Wt 70.8 kg. Creat cl 85 ml/min (from admission labs. Today's not yet drawn).  Plan 1. Zosyn 3.375 gm IV q8h infuse each dose over 4 hours 2. Continue vancomycin 750 mg IV q 12h 3. Monitor CBC. Creat, cx, clinical response Herby Abraham, Pharm.D. [phone] 06/05/2012 10:09 AM

## 2012-06-05 NOTE — Progress Notes (Addendum)
ANTICOAGULATION CONSULT NOTE - Initial Consult  Pharmacy Consult for Heparin IV Indication: DVT RLE  No Known Allergies  Patient Measurements: Height: 6' (182.9 cm) Weight: 156 lb (70.761 kg) IBW/kg (Calculated) : 77.6  Heparin Dosing Weight: 70.8 kg  Vital Signs: Temp: 98.8 F (37.1 C) (09/01 1449) BP: 99/64 mmHg (09/01 1449) Pulse Rate: 99  (09/01 1449)  Labs:  Basename 06/05/12 0936 06/04/12 2251 06/04/12 1455  HGB 8.2* -- 13.1  HCT 23.1* -- 38.1*  PLT 140* -- 152  APTT -- -- --  LABPROT -- 17.1* --  INR -- 1.37 --  HEPARINUNFRC -- -- --  CREATININE 0.57 -- 0.61  CKTOTAL -- -- --  CKMB -- -- --  TROPONINI -- -- --    Estimated Creatinine Clearance: 84.8 ml/min (by C-G formula based on Cr of 0.57).   Medical History: Past Medical History  Diagnosis Date  . Neuropathy   . Hypertension   . Hepatitis C     Medications:  Prescriptions prior to admission  Medication Sig Dispense Refill  . furosemide (LASIX) 20 MG tablet Take 20 mg by mouth daily.      Marland Kitchen GABAPENTIN PO Take 1 capsule by mouth 3 (three) times daily. Pt does not know the dosage of medication. Patient uses the [place] pharmacy. Cant confirm dosage.      . Levothyroxine Sodium (SYNTHROID PO) Take 1 tablet by mouth daily. Pt doesn't know dose of medication. He uses the [place] pharmacy so pharmacy cant confirm dosage.      Marland Kitchen omeprazole (PRILOSEC) 20 MG capsule Take 20 mg by mouth daily.      . traMADol (ULTRAM) 50 MG tablet Take 50 mg by mouth every 6 (six) hours as needed. pain      . traZODone (DESYREL) 100 MG tablet Take 100 mg by mouth at bedtime.       Scheduled:    . docusate sodium  100 mg Oral BID  . fluconazole  100 mg Oral Daily  . folic acid  1 mg Oral Daily  . furosemide  20 mg Oral BID  . heparin  5,000 Units Subcutaneous Q8H  . iohexol  20 mL Oral Q1 Hr x 2  . levothyroxine  50 mcg Oral QAC breakfast  . LORazepam  0-4 mg Oral Q6H   Followed by  . LORazepam  0-4 mg Oral Q12H  .  multivitamin with minerals  1 tablet Oral Daily  . pantoprazole  40 mg Oral Q1200  . piperacillin-tazobactam (ZOSYN)  IV  3.375 g Intravenous Q8H  . sodium chloride  3 mL Intravenous Q12H  . sodium chloride  3 mL Intravenous Q12H  . spironolactone  12.5 mg Oral Daily  . thiamine  100 mg Oral Daily   Or  . thiamine  100 mg Intravenous Daily  . traZODone  100 mg Oral QHS  . vancomycin  750 mg Intravenous Q12H  . vancomycin  1,000 mg Intravenous Once    Assessment: 71 yo male with RLE cellulits. Bilateral lower extremity venous Dopplers + RLE DVT. Past medical history of hepatitis C with liver disease and alcohol abuse. Abdominal/pelvic CT showed moderate cirrhosis. Baseline INR 1.37,  H/H 8.2/23.1, PLTC 140K;  AST/ALT within normal.  SQ Heparin 5000 units given @14 :58 today.   Goal of Therapy:  Heparin level 0.3-0.7 units/ml Monitor platelets by anticoagulation protocol: Yes   Plan:  Heparin 3000 units IV bolus x 1  Heparin IV drip 1000 units/hr Heparin level in 6 hours Daily  heparin level and CBC.     Noah Delaine, RPh Clinical Pharmacist Pager: [phone] 06/05/2012,4:54 PM  Addendum:  Canceled heparin bolus. Will start heparin drip without bolus due to Hgb dropped to 8.2, hematuria in UA, h/o alcohol abuse, moderate cirrhosis per CT, INR 1.37.  Urine amber colored; No blood seen in urine per RN.  Noah Delaine, RPh Clinical Pharmacist Pager: [phone] 06/05/2012 18:29 PM

## 2012-06-05 NOTE — Progress Notes (Signed)
[place] will be performed in the AM related to patient having contrast for CT scan.  Dr. Brien Few notified.

## 2012-06-05 NOTE — Progress Notes (Signed)
  Comment: LE venous dopplers on 06/05/12, demonstrated an acute DVT noted in the right femoral and popliteal veins. 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  Plan: Anticoagulation will be instituted, but will favor iv Heparin at this time, given imaging study results, confirming cirrhosis and portal HTN. Perhaps endoscopic evaluation by GI will be appropriate, before proceeding with Lovenox/Coumadin therapy.   C. Tyrell Seifer. MD, FACP.

## 2012-06-05 NOTE — Progress Notes (Signed)
Only one set of blood cultures obtained related to patient refusing to have anymore labs drawn.  Patient was instructed the importance of having labs drawn, however, continued to refuse labs at this time.   Nathan Frank

## 2012-06-05 NOTE — Progress Notes (Signed)
VASCULAR LAB PRELIMINARY  PRELIMINARY  PRELIMINARY  PRELIMINARY  Bilateral lower extremity venous Dopplers completed.    Preliminary report:  There is acute DVT noted in the right femoral and popliteal veins.  All other veins appear thrombus free.  Nathan Frank, 06/05/2012, 4:15 PM

## 2012-06-06 ENCOUNTER — Inpatient Hospital Stay (HOSPITAL_COMMUNITY): Payer: Medicare Other

## 2012-06-06 ENCOUNTER — Encounter (HOSPITAL_COMMUNITY): Payer: Self-pay | Admitting: Internal Medicine

## 2012-06-06 DIAGNOSIS — I82409 Acute embolism and thrombosis of unspecified deep veins of unspecified lower extremity: Secondary | ICD-10-CM

## 2012-06-06 DIAGNOSIS — D649 Anemia, unspecified: Secondary | ICD-10-CM

## 2012-06-06 DIAGNOSIS — B1920 Unspecified viral hepatitis C without hepatic coma: Secondary | ICD-10-CM

## 2012-06-06 DIAGNOSIS — K7460 Unspecified cirrhosis of liver: Secondary | ICD-10-CM

## 2012-06-06 HISTORY — DX: Acute embolism and thrombosis of unspecified deep veins of unspecified lower extremity: I82.409

## 2012-06-06 LAB — CBC
HCT: 20.9 % — ABNORMAL LOW (ref 39.0–52.0)
HCT: 22.4 % — ABNORMAL LOW (ref 39.0–52.0)
Platelets: 120 10*3/uL — ABNORMAL LOW (ref 150–400)
Platelets: 133 10*3/uL — ABNORMAL LOW (ref 150–400)
RDW: 14.2 % (ref 11.5–15.5)
RDW: 14.5 % (ref 11.5–15.5)
WBC: 5.9 10*3/uL (ref 4.0–10.5)
WBC: 4.3 10*3/uL (ref 4.0–10.5)

## 2012-06-06 LAB — COMPREHENSIVE METABOLIC PANEL
ALT: 10 U/L (ref 0–53)
AST: 25 U/L (ref 0–37)
Albumin: 1.7 g/dL — ABNORMAL LOW (ref 3.5–5.2)
Alkaline Phosphatase: 58 U/L (ref 39–117)
BUN: 5 mg/dL — ABNORMAL LOW (ref 6–23)
Chloride: 105 mEq/L (ref 96–112)
Potassium: 3.2 mEq/L — ABNORMAL LOW (ref 3.5–5.1)
Sodium: 135 mEq/L (ref 135–145)
Total Bilirubin: 0.9 mg/dL (ref 0.3–1.2)

## 2012-06-06 LAB — URINE CULTURE

## 2012-06-06 LAB — HEPARIN LEVEL (UNFRACTIONATED): Heparin Unfractionated: 0.21 IU/mL — ABNORMAL LOW (ref 0.30–0.70)

## 2012-06-06 LAB — ABO/RH: ABO/RH(D): O POS

## 2012-06-06 LAB — PREPARE RBC (CROSSMATCH)

## 2012-06-06 MED ORDER — SODIUM CHLORIDE 0.9 % IJ SOLN
10.0000 mL | INTRAMUSCULAR | Status: DC | PRN
  Administered 2012-06-07 – 2012-06-09 (×6): 10 mL

## 2012-06-06 MED ORDER — FUROSEMIDE 10 MG/ML IJ SOLN
20.0000 mg | Freq: Once | INTRAMUSCULAR | Status: AC
  Administered 2012-06-07: 20 mg via INTRAVENOUS
  Filled 2012-06-06: qty 2

## 2012-06-06 MED ORDER — POTASSIUM CHLORIDE CRYS ER 20 MEQ PO TBCR
40.0000 meq | EXTENDED_RELEASE_TABLET | Freq: Every day | ORAL | Status: DC
  Administered 2012-06-07 – 2012-06-11 (×5): 40 meq via ORAL
  Filled 2012-06-06 (×7): qty 2

## 2012-06-06 MED ORDER — ENSURE COMPLETE PO LIQD
237.0000 mL | Freq: Two times a day (BID) | ORAL | Status: DC
  Administered 2012-06-06 – 2012-06-11 (×10): 237 mL via ORAL

## 2012-06-06 MED ORDER — HEPARIN (PORCINE) IN NACL 100-0.45 UNIT/ML-% IJ SOLN
1250.0000 [IU]/h | INTRAMUSCULAR | Status: DC
  Administered 2012-06-07: 1250 [IU]/h via INTRAVENOUS
  Filled 2012-06-06: qty 250

## 2012-06-06 MED ORDER — AZITHROMYCIN 250 MG PO TABS
250.0000 mg | ORAL_TABLET | Freq: Every day | ORAL | Status: DC
Start: 2012-06-07 — End: 2012-06-09
  Administered 2012-06-07 – 2012-06-08 (×2): 250 mg via ORAL
  Filled 2012-06-06 (×3): qty 1

## 2012-06-06 MED ORDER — AZITHROMYCIN 500 MG PO TABS
500.0000 mg | ORAL_TABLET | Freq: Every day | ORAL | Status: AC
  Administered 2012-06-06: 500 mg via ORAL
  Filled 2012-06-06 (×2): qty 1

## 2012-06-06 MED ORDER — POTASSIUM CHLORIDE CRYS ER 20 MEQ PO TBCR
40.0000 meq | EXTENDED_RELEASE_TABLET | Freq: Once | ORAL | Status: AC
  Administered 2012-06-06: 40 meq via ORAL
  Filled 2012-06-06: qty 2

## 2012-06-06 MED ORDER — MAGNESIUM SULFATE 40 MG/ML IJ SOLN
2.0000 g | Freq: Once | INTRAMUSCULAR | Status: AC
  Administered 2012-06-06: 2 g via INTRAVENOUS
  Filled 2012-06-06: qty 50

## 2012-06-06 NOTE — Progress Notes (Addendum)
[facility] PROGRESS NOTE  Cayson Kalb ZOX:[id number] DOB: [birth year] DOA: 06/04/2012 PCP: Pcp Not In System  Assessment/Plan: Active Problems:  Cellulitis  Alcohol abuse  Weight loss  Hepatitis C  Liver disease  Hematuria  Oral thrush    1. RLE Cellulitis: Patient presented with increased swelling, pain and redness of RLE, in the setting of severe lower extremity edema, likely secondary to hypoalbuminemia. On iv Vancomycin, day#3/Zosyn, day#2. Inflammatory phenomena have improved, wcc is normal, and he has had no pyrexia so far.  2. RLE DVT: Given disparity in circumference of lower extremities, Venous doppler was done on 06/05/12, and revealed an acute DVT noted in the right femoral and popliteal veins. He has been commenced on iv Heparin infusion.  3. Alcohol abuse: Patient is a chronic alcoholic, and has expressed interest in quitting. He has been placed on CIWA protocol, vitamin supplements and counseled. So far, he has no clinical features of alcohol withdrawal.  4. Weight loss/Liver cirrhosis: Patient has lost about 20 pounds in the last one month. He has a known history of chronic hepatitis C/chronic liver disease. 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  AFP tumor marker is negative at 3.5. Per patient, he has had EGD about a year ago, which was negative for varices. Have consulted Dr Stan Head, gastroenterologist, as it appears endoscopic evaluation is indicated, particularly as patient is anemic and this may also, have implications for on-going anticoagulation.  5. Hepatitis C: Patient is currently not being treated secondary to alcohol abuse.  6. Anemia: Patient had a hemoglobin of 13.1 at presentation, with MCV of 88.5. His hemoglobin has since dropped to 7.5 on 06/06/12. He has no hematemesis or melena, stool guaiac is negative (Done by GI today), and abdominal imaging did not show retroperitoneal bleed. Will transfuse 2 unite PRBC.  6. Hematuria: Urinalysis  revealed microhematuria. Will repeat urinalysis, and if persists he may benefit from urology work up. Abdominal ultrasound has been done, and report is pending.  6. Oral thrush: This was an incidental finding on physical examination. Treating with a 7-day course of Diflucan. Now day#2.  7. Severe protein-calorie malnutrition: Patient appears underweight, and has albumin of 2.4, as well as peripheral edema, consistent with malnutrition. Have requested nutritionist consultation.  8. Hypokalemia/Hypomagnesemia: Repleting as indicated.  9. CAP: CXR of 06/05/12, demonstrated left pleural effusion and airspace disease is compatible with Pneumonia. Patient has no cough, and is already on broad spectrum antibiotics, as described above. Will add oral Azithromycin, to cover atypicals.      Code Status: Full Code.  Family Communication:  Disposition Plan: To be determined.    Brief narrative:  71 y.o. male with history of Neuropathy, Hypertension, Hepatitis C and chronic alcoholism, presenting with intermittent bilateral lower extremity swelling, much worse in right leg over the past 2 days with broken skin and oozing fluid, but no fever, chills or dyspnea. He has lost a lot of weight and has no appetite.  Consultants:  N/A.  Procedures:  Pending.   Antibiotics:  Vancomycin started 06/04/12.   Zosyn started 06/05/12.  Azithromycin 06/06/12>>>  HPI/Subjective: No new issues.   Objective: Vital signs in last 24 hours: Temp:  [98.4 F (36.9 C)-98.8 F (37.1 C)] 98.4 F (36.9 C) (09/01 2137) Pulse Rate:  [99-126] 115  (09/01 2137) Resp:  [18] 18  (09/01 2137) BP: (95-99)/(49-64) 95/49 mmHg (09/01 2137) SpO2:  [100 %] 100 % (09/01 2137) Weight change:  Last BM Date: 06/05/12  Intake/Output from previous  day: 09/01 0701 - 09/02 0700 In: 448.3 [I.V.:48.3; IV Piggyback:400] Out: 500 [Urine:500]     Physical Exam: General: Comfortable, alert, communicative, fully oriented, not short  of breath at rest.  HEENT:  No clinical pallor, no jaundice, no conjunctival injection or discharge. Hydration appear fair. Patient has oral thrush.  NECK:  Supple, JVP not seen, no carotid bruits, no palpable lymphadenopathy, no palpable goiter. CHEST:  Clinically clear to auscultation, no wheezes, no crackles. HEART:  Sounds 1 and 2 heard, normal, regular, no murmurs. ABDOMEN:  Full, soft, no scars, non-tender, no palpable organomegaly, no palpable masses, normal bowel sounds. No clinical ascites.  GENITALIA:  Not examined. LOWER EXTREMITIES:  Moderate pitting edema, palpable peripheral pulses, RLE is more swollen than left, has excoriated/denuded skin, with oozing fluid and erythema from just below knee to just above ankle. MUSCULOSKELETAL SYSTEM:  Generalized osteoarthritic changes, otherwise, normal. CENTRAL NERVOUS SYSTEM:  No focal neurologic deficit on gross examination.  Lab Results:  [facility] 06/06/12 0547 06/05/12 0936  WBC 5.9 3.9*  HGB 7.5* 8.2*  HCT 20.9* 23.1*  PLT 120* 140*    Basename 06/06/12 0547 06/05/12 0936  NA 135 137  K 3.2* 3.9  CL 105 106  CO2 23 23  GLUCOSE 104* 91  BUN 5* 4*  CREATININE 0.70 0.57  CALCIUM 7.5* 8.1*   Recent Results (from the past 240 hour(s))  CULTURE, BLOOD (ROUTINE X 2)     Status: Normal (Preliminary result)   Collection Time   06/05/12  9:50 AM      Component Value Range Status Comment   Specimen Description BLOOD ARM LEFT   Final    Special Requests BOTTLES DRAWN AEROBIC ONLY 5CC   Final    Culture  Setup Time 06/05/2012 15:02   Final    Culture     Final    Value:        BLOOD CULTURE RECEIVED NO GROWTH TO DATE CULTURE WILL BE HELD FOR 5 DAYS BEFORE ISSUING A FINAL NEGATIVE REPORT   Report Status PENDING   Incomplete      Studies/Results: Dg Chest 2 View  06/05/2012  *RADIOLOGY REPORT*  Clinical Data: Weight loss.  Weakness.  CHEST - 2 VIEW  Comparison: Chest and rib films 10/20/2011.  Findings: The heart is mildly enlarged.  No significant edema is present to suggest failure.  Emphysematous changes are present. Left lower lobe airspace disease is concerning for pneumonia.  A left pleural effusion is present. Minimal airspace disease is present at the right lung base.  The visualized soft tissues and bony thorax are unremarkable.  IMPRESSION:  1.  Left pleural effusion and airspace disease is compatible with pneumonia. 2.  Mild cardiomegaly without failure. 3.  Minimal atelectasis at the right lung base.   Original Report Authenticated By: Jamesetta Orleans. MATTERN, M.D.    Ct Abdomen Pelvis W Contrast  06/05/2012  *RADIOLOGY REPORT*  Clinical Data: Chronic liver disease.  Weight loss in the past month.  Generalized abdominal pain.  Hepatitis C.  Hypertension. Alcohol abuse.  Hematuria.  CT ABDOMEN AND PELVIS WITH CONTRAST  Technique:  Multidetector CT imaging of the abdomen and pelvis was performed following the standard protocol during bolus administration of intravenous contrast.  Contrast: 80mL OMNIPAQUE IOHEXOL 300 MG/ML  SOLN, 1 OMNIPAQUE IOHEXOL 300 MG/ML  SOLN  Comparison: None.  Findings: Mild subsegmental atelectasis at the right lung base. Patchy left base air space disease.  Mild cardiomegaly.  Bilateral gynecomastia.  Small bilateral pleural  effusions.  Right cardiophrenic angle adenopathy is mild and likely reactive at 9 mm on image 12.  Moderate cirrhosis.  A lateral segment left liver lobe 9 mm lesion is well-circumscribed and likely a small cyst.  Portal vein and branches patent.  Hepatic veins not well evaluated secondary to technique and bolus timing.  Normal spleen.  There are paraesophageal collaterals suspicious for varices on image 13.  The proximal stomach is underdistended.  Mild pancreatic atrophy.  The gallbladder is mildly prominent.  Suspicion of at least one stone within.  Wall thickness of up to 7 mm.  No gross surrounding inflammatory changes.  The common duct measures 1.0 cm on coronal image 54, slightly  dilated for patient age.  There is artifact in the region of the distal common duct on images 32 and 33.  This makes evaluation for choledocholithiasis difficult.  No splenic artery aneurysm.  Normal adrenal glands.  Mild renal cortical thinning bilaterally.  No retroperitoneal or retrocrural adenopathy.  Normal colon and terminal ileum.  The appendix is likely identified on image 59 and within normal limits.  Normal caliber of small bowel loops. No pneumatosis or free intraperitoneal air.  Trace perihepatic and perisplenic ascites.  No pelvic adenopathy.    Normal urinary bladder and prostate. Probable injection induced gas within the anterior abdominal wall. No acute osseous abnormality.  Marked lower lumbar spondylosis.  IMPRESSION:  1.  Moderate cirrhosis and portal venous hypertension. 2.  Gallbladder wall thickening and probable stone or stones. Nonspecific in the setting of cirrhosis/portal venous hypertension. Could be secondary.  However, acute cholecystitis cannot be excluded.  If there are right upper quadrant symptoms, consider dedicated ultrasound. 3.  Small bilateral pleural effusions with left base pneumonia. 4.  Bilateral gynecomastia. 5.  Mild biliary ductal dilatation.  Artifact degradation in the region of the distal common duct.  Cannot exclude choledocholithiasis. If further imaging characterization is desired, consider MRCP.   Original Report Authenticated By: Consuello Bossier, M.D.     Medications: Scheduled Meds:    . docusate sodium  100 mg Oral BID  . fluconazole  100 mg Oral Daily  . folic acid  1 mg Oral Daily  . furosemide  20 mg Oral BID  . iohexol  20 mL Oral Q1 Hr x 2  . levothyroxine  50 mcg Oral QAC breakfast  . LORazepam  0-4 mg Oral Q6H   Followed by  . LORazepam  0-4 mg Oral Q12H  . multivitamin with minerals  1 tablet Oral Daily  . pantoprazole  40 mg Oral Q1200  . piperacillin-tazobactam (ZOSYN)  IV  3.375 g Intravenous Q8H  . sodium chloride  3 mL Intravenous  Q12H  . sodium chloride  3 mL Intravenous Q12H  . spironolactone  12.5 mg Oral Daily  . thiamine  100 mg Oral Daily   Or  . thiamine  100 mg Intravenous Daily  . traZODone  100 mg Oral QHS  . vancomycin  750 mg Intravenous Q12H  . DISCONTD: heparin  2,500 Units Intravenous Once  . DISCONTD: heparin  3,000 Units Intravenous Once  . DISCONTD: heparin  5,000 Units Subcutaneous Q8H   Continuous Infusions:    . heparin 1,250 Units/hr (06/06/12 0140)   PRN Meds:.sodium chloride, albuterol, alum & mag hydroxide-simeth, guaiFENesin-dextromethorphan, iohexol, LORazepam, LORazepam, morphine injection, ondansetron (ZOFRAN) IV, ondansetron, sodium chloride, traMADol    LOS: 2 days   Maron Stanzione,CHRISTOPHER  [facility] Pager [phone]. If 8PM-8AM, please contact night-coverage at [email or web address], password [facility]  06/06/2012, 10:37 AM  LOS: 2 days

## 2012-06-06 NOTE — Consult Note (Addendum)
Referring Provider: Dr. Brien Few Primary Care Physician:  Pcp Not In System  thru [facility]   Primary Gastroenterologist:  none.  Reason for Consultation:  R/O esophageal varices  HPI: Nathan Frank is a 71 y.o. male with history of chronic alcohol abuse, and hepatitis C. He has received care through the [place] in [place], and outpt clinic in [place] (Dr. Marny Lowenstein). Patient was admitted here on 06/04/2012 with complaints of swelling in his right lower extremity and weeping of fluid. Is felt to have a cellulitis and has been started on vancomycin and Zosyn. He underwent lower extremity Dopplers which showed acute DVT in the right femoral and right popliteal veins. He is now on IV heparin.  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 Common duct is slightly dilated at 1 cm he has probably 1 gallstone. We are asked to evaluate to consider upper endoscopy to assess for esophageal varices and help in decision-making regarding safety of anticoagulation. Patient states he has [facility]. He is unaware of any findings in his esophagus says he was told that he had colon polyps Patient has been actively drinking for many years, says he has not had history of DT'Snd feels fine today. The patient had a normal hemoglobin of 13.1 on admission, hemoglobin 8.2 yesterday and 7.5 today. He has not had any evidence of GI bleeding has no complaints of abdominal discomfort.   Past Medical History  Diagnosis Date  . Neuropathy   . Hypertension   . Hepatitis C     History reviewed. No pertinent past surgical history.  Prior to Admission medications   Medication Sig Start Date End Date Taking? Authorizing Provider  furosemide (LASIX) 20 MG tablet Take 20 mg by mouth daily.   Yes Historical Provider, MD  GABAPENTIN  PO Take 1 capsule by mouth 3 (three) times daily. Pt does not know the dosage of medication. Patient uses the [place] pharmacy. Cant confirm dosage.   Yes Historical Provider, MD  Levothyroxine Sodium (SYNTHROID PO) Take 1 tablet by mouth daily. Pt doesn't know dose of medication. He uses the [place] pharmacy so pharmacy cant confirm dosage.   Yes Historical Provider, MD  omeprazole (PRILOSEC) 20 MG capsule Take 20 mg by mouth daily.   Yes Historical Provider, MD  traMADol (ULTRAM) 50 MG tablet Take 50 mg by mouth every 6 (six) hours as needed. pain   Yes Historical Provider, MD  traZODone (DESYREL) 100 MG tablet Take 100 mg by mouth at bedtime.   Yes Historical Provider, MD    Current Facility-Administered Medications  Medication Dose Route Frequency Provider Last Rate Last Dose  . 0.9 %  sodium chloride infusion  250 mL Intravenous PRN Therisa Doyne, MD      . albuterol (PROVENTIL) (5 MG/ML) 0.5% nebulizer solution 2.5 mg  2.5 mg Nebulization Q2H PRN Therisa Doyne, MD      . alum & mag hydroxide-simeth (MAALOX/MYLANTA) 200-200-20 MG/5ML suspension 30 mL  30 mL Oral Q6H PRN Therisa Doyne, MD      . docusate sodium (COLACE) capsule 100 mg  100 mg Oral BID Therisa Doyne, MD   100 mg at 06/05/12 2142  . fluconazole (DIFLUCAN) tablet 100 mg  100 mg Oral Daily Laveda Norman, MD   100 mg at 06/05/12  1458  . folic acid (FOLVITE) tablet 1 mg  1 mg Oral Daily Therisa Doyne, MD   1 mg at 06/05/12 1001  . furosemide (LASIX) tablet 20 mg  20 mg Oral BID Therisa Doyne, MD   20 mg at 06/05/12 1810  . guaiFENesin-dextromethorphan (ROBITUSSIN DM) 100-10 MG/5ML syrup 5 mL  5 mL Oral Q4H PRN Therisa Doyne, MD      . heparin ADULT infusion 100 units/mL (25000 units/250 mL)  1,250 Units/hr Intravenous Continuous Colleen Can, PHARMD 12.5 mL/hr at 06/06/12 0140 1,250 Units/hr at 06/06/12 0140  . iohexol (OMNIPAQUE) 300 MG/ML solution 20 mL  20 mL Oral Q1 Hr x 2 Medication Radiologist, MD    20 mL at 06/05/12 1130  . iohexol (OMNIPAQUE) 300 MG/ML solution 80 mL  80 mL Intravenous Once PRN Medication Radiologist, MD   80 mL at 06/05/12 1442  . levothyroxine (SYNTHROID, LEVOTHROID) tablet 50 mcg  50 mcg Oral QAC breakfast Therisa Doyne, MD   50 mcg at 06/05/12 1000  . LORazepam (ATIVAN) tablet 1 mg  1 mg Oral Q6H PRN Therisa Doyne, MD   1 mg at 06/05/12 1612   Or  . LORazepam (ATIVAN) injection 1 mg  1 mg Intravenous Q6H PRN Therisa Doyne, MD      . LORazepam (ATIVAN) tablet 0-4 mg  0-4 mg Oral Q6H Therisa Doyne, MD   1 mg at 06/05/12 1247   Followed by  . LORazepam (ATIVAN) tablet 0-4 mg  0-4 mg Oral Q12H Therisa Doyne, MD      . morphine 2 MG/ML injection 2 mg  2 mg Intravenous Q4H PRN Therisa Doyne, MD      . multivitamin with minerals tablet 1 tablet  1 tablet Oral Daily Therisa Doyne, MD   1 tablet at 06/05/12 1001  . ondansetron (ZOFRAN) tablet 4 mg  4 mg Oral Q6H PRN Therisa Doyne, MD       Or  . ondansetron (ZOFRAN) injection 4 mg  4 mg Intravenous Q6H PRN Therisa Doyne, MD      . pantoprazole (PROTONIX) EC tablet 40 mg  40 mg Oral Q1200 Therisa Doyne, MD   40 mg at 06/05/12 1247  . piperacillin-tazobactam (ZOSYN) IVPB 3.375 g  3.375 g Intravenous Q8H Herby Abraham, PHARMD   3.375 g at 06/06/12 0541  . sodium chloride 0.9 % injection 3 mL  3 mL Intravenous Q12H Anastassia Doutova, MD      . sodium chloride 0.9 % injection 3 mL  3 mL Intravenous Q12H Anastassia Doutova, MD      . sodium chloride 0.9 % injection 3 mL  3 mL Intravenous PRN Therisa Doyne, MD      . spironolactone (ALDACTONE) tablet 12.5 mg  12.5 mg Oral Daily Therisa Doyne, MD   12.5 mg at 06/05/12 1000  . thiamine (VITAMIN B-1) tablet 100 mg  100 mg Oral Daily Therisa Doyne, MD   100 mg at 06/05/12 1001   Or  . thiamine (B-1) injection 100 mg  100 mg Intravenous Daily Therisa Doyne, MD      . traMADol (ULTRAM) tablet 50 mg  50 mg Oral Q6H  PRN Therisa Doyne, MD      . traZODone (DESYREL) tablet 100 mg  100 mg Oral QHS Therisa Doyne, MD   100 mg at 06/05/12 2142  . vancomycin (VANCOCIN) 750 mg in sodium chloride 0.9 % 150 mL IVPB  750 mg Intravenous Q12H Colleen Can, PHARMD   750 mg at  06/05/12 2142  . DISCONTD: heparin bolus via infusion 2,500 Units  2,500 Units Intravenous Once Arman Filter, [facility]      . DISCONTD: heparin bolus via infusion 3,000 Units  3,000 Units Intravenous Once Arman Filter, [facility]      . DISCONTD: heparin injection 5,000 Units  5,000 Units Subcutaneous Q8H Therisa Doyne, MD   5,000 Units at 06/05/12 1458    Allergies as of 06/04/2012  . (No Known Allergies)    Family History  Problem Relation Age of Onset  . Stroke Father   . Prostate cancer Brother     History   Social History  . Marital Status: Single                       Social History Main Topics  . Smoking status: Never Smoker   . Smokeless tobacco: Not on file  . Alcohol Use: Yes     half a pint of vine a day  . Drug Use: No     Review of Systems: Pertinent positive and negative review of systems were noted in the above HPI section.  All other review of systems was otherwise negative.  Hx same Iva Boop, MD, [facility]  Physical Exam: Vital signs in last 24 hours: Temp:  [98.4 F (36.9 C)-98.8 F (37.1 C)] 98.4 F (36.9 C) (09/01 2137) Pulse Rate:  [99-126] 115  (09/01 2137) Resp:  [18] 18  (09/01 2137) BP: (95-99)/(49-64) 95/49 mmHg (09/01 2137) SpO2:  [100 %] 100 % (09/01 2137) Last BM Date: 06/05/12 General:   Alert,  Well-developed,thin, pleasant and cooperative in NAD Head:  Normocephalic and atraumatic. Eyes:  Sclera clear, no icterus.   Conjunctiva pale Ears:  Normal auditory acuity. Nose:  No deformity, discharge,  or lesions. Mouth:  No deformity or lesions,dentition poor   Neck:  Supple; no masses or thyromegaly. Breasts: : gynecomastia bilateral Lungs:  Clear  throughout to auscultation.   No wheezes, crackles, or rhonchi . Heart:  Regular rate and rhythm; no murmurs, clicks, rubs,  or gallops. Abdomen:  Soft,nontender, BS active,nonpalp mass or hsm.   Rectal: brown, hemocult negative stool Msk:  Symmetrical without gross deformities. . Pulses:  Normal pulses noted. Extremities: Right lower extremity edema, and cellulitis with some skin breakdown. Neurologic:  Alert and  oriented x4;  grossly normal neurologically.,no asterixis Skin:  Intact without significant lesions or rashes.. Psych:  Alert and cooperative. Normal mood and affect.   Exam same but rectal not repeated Iva Boop, MD, [facility]  Lab Results:  [facility] 06/06/12 1610 06/05/12 0936 06/04/12 1455  WBC 5.9 3.9* 8.1  HGB 7.5* 8.2* 13.1  HCT 20.9* 23.1* 38.1*  PLT 120* 140* 152   BMET  Basename 06/06/12 0547 06/05/12 0936 06/04/12 1455  NA 135 137 133*  K 3.2* 3.9 3.7  CL 105 106 102  CO2 23 23 19   GLUCOSE 104* 91 110*  BUN 5* 4* 4*  CREATININE 0.70 0.57 0.61  CALCIUM 7.5* 8.1* 8.5   LFT  Basename 06/06/12 0547  PROT 5.2*  ALBUMIN 1.7*  AST 25  ALT 10  ALKPHOS 58  BILITOT 0.9  BILIDIR --  IBILI --   PT/INR  Basename 06/04/12 2251  LABPROT 17.1*  INR 1.37       Studies/Results: Dg Chest 2 View  06/05/2012  *RADIOLOGY REPORT*  Clinical Data: Weight loss.  Weakness.  CHEST - 2 VIEW  Comparison: Chest and rib films 10/20/2011.  Findings: The heart is mildly enlarged. No significant edema is present to suggest failure.  Emphysematous changes are present. Left lower lobe airspace disease is concerning for pneumonia.  A left pleural effusion is present. Minimal airspace disease is present at the right lung base.  The visualized soft tissues and bony thorax are unremarkable.  IMPRESSION:  1.  Left pleural effusion and airspace disease is compatible with pneumonia. 2.  Mild cardiomegaly without failure. 3.  Minimal atelectasis at the right lung base.   Original  Report Authenticated By: Jamesetta Orleans. MATTERN, M.D.    Ct Abdomen Pelvis W Contrast  06/05/2012  *RADIOLOGY REPORT*  Clinical Data: Chronic liver disease.  Weight loss in the past month.  Generalized abdominal pain.  Hepatitis C.  Hypertension. Alcohol abuse.  Hematuria.  CT ABDOMEN AND PELVIS WITH CONTRAST  Technique:  Multidetector CT imaging of the abdomen and pelvis was performed following the standard protocol during bolus administration of intravenous contrast.  Contrast: 80mL OMNIPAQUE IOHEXOL 300 MG/ML  SOLN, 1 OMNIPAQUE IOHEXOL 300 MG/ML  SOLN  Comparison: None.  Findings: Mild subsegmental atelectasis at the right lung base. Patchy left base air space disease.  Mild cardiomegaly.  Bilateral gynecomastia.  Small bilateral pleural effusions.  Right cardiophrenic angle adenopathy is mild and likely reactive at 9 mm on image 12.  Moderate cirrhosis.  A lateral segment left liver lobe 9 mm lesion is well-circumscribed and likely a small cyst.  Portal vein and branches patent.  Hepatic veins not well evaluated secondary to technique and bolus timing.  Normal spleen.  There are paraesophageal collaterals suspicious for varices on image 13.  The proximal stomach is underdistended.  Mild pancreatic atrophy.  The gallbladder is mildly prominent.  Suspicion of at least one stone within.  Wall thickness of up to 7 mm.  No gross surrounding inflammatory changes.  The common duct measures 1.0 cm on coronal image 54, slightly dilated for patient age.  There is artifact in the region of the distal common duct on images 32 and 33.  This makes evaluation for choledocholithiasis difficult.  No splenic artery aneurysm.  Normal adrenal glands.  Mild renal cortical thinning bilaterally.  No retroperitoneal or retrocrural adenopathy.  Normal colon and terminal ileum.  The appendix is likely identified on image 59 and within normal limits.  Normal caliber of small bowel loops. No pneumatosis or free intraperitoneal air.   Trace perihepatic and perisplenic ascites.  No pelvic adenopathy.    Normal urinary bladder and prostate. Probable injection induced gas within the anterior abdominal wall. No acute osseous abnormality.  Marked lower lumbar spondylosis.  IMPRESSION:  1.  Moderate cirrhosis and portal venous hypertension. 2.  Gallbladder wall thickening and probable stone or stones. Nonspecific in the setting of cirrhosis/portal venous hypertension. Could be secondary.  However, acute cholecystitis cannot be excluded.  If there are right upper quadrant symptoms, consider dedicated ultrasound. 3.  Small bilateral pleural effusions with left base pneumonia. 4.  Bilateral gynecomastia. 5.  Mild biliary ductal dilatation.  Artifact degradation in the region of the distal common duct.  Cannot exclude choledocholithiasis. If further imaging characterization is desired, consider MRCP.   Original Report Authenticated By: Consuello Bossier, M.D.     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  Suspect he does have esophageal varices and will need further  evaluation with EGD. If he does have esophageal varices, will treat. He may benefit from IVC filter, rather than outpatient anticoagulation in this situation. #2 normocytic anemia. Hemoccult-negative today, with no evidence for active blood loss area suspect he was hemoconcentrated on admission and currnet values are closer to his baseline #3 mild biliary ductal dilation, gallstones - does not have clinical scenario consistent with CBD obstruction #4 left pleural effusion and "air space disease" by xray #5 mild thrombocytopenia and coagulopathy #6 hypokalemia #7 history of hepatitis C    PLAN: #1 patient is to be transfused today and #2 will schedule for upper endoscopy tomorrow with Dr.  Christella Hartigan, procedure discussed with the patient in detail and he is agreeable to proceed. Further recommendations pending findings on endoscopy #3 stop heparin 2 hours prior to procedure   Amy Esterwood  06/06/2012, 10:35 AM   [facility] Attending  I have also seen and assessed the patient and agree with the above note. I have made additions and changes above also.  Will ask for egd and colonoscopy records from [place] also - though still think EGD here is sensible to help sort out treatment course. The risks and benefits as well as alternatives of endoscopic procedure(s) have been discussed and reviewed. All questions answered. The patient agrees to proceed.  Getting blood so too late to check ferritin but I will check B12 level.  Iva Boop, MD, [facility] [phone] (pager) 06/06/2012 1:33 PM

## 2012-06-06 NOTE — Progress Notes (Signed)
ANTICOAGULATION CONSULT NOTE - Follow Up Consult  Pharmacy Consult for heparin Indication: DVT  Labs:  Basename 06/06/12 1942 06/06/12 0547 06/06/12 0038 06/05/12 0936 06/04/12 2251 06/04/12 1455  HGB -- 7.5* -- 8.2* -- --  HCT -- 20.9* -- 23.1* -- 38.1*  PLT -- 120* -- 140* -- 152  APTT -- -- -- -- -- --  LABPROT -- -- -- -- 17.1* --  INR -- -- -- -- 1.37 --  HEPARINUNFRC 0.46 0.27* 0.21* -- -- --  CREATININE -- 0.70 -- 0.57 -- 0.61  CKTOTAL -- -- -- -- -- --  CKMB -- -- -- -- -- --  TROPONINI -- -- -- -- -- --    Assessment: 71yo male on heparin for RLE DVT. Heparin level (0.46) is at-goal on 1250 units/hr.   Goal of Therapy:  Heparin level 0.3-0.7 units/ml   Plan:  1. Continue IV heparin at 1250 units/hr 2. Heparin level, CBC with AM labs on 06/07/2012 3. Stop heparin 2 hours prior to upper endoscopy on 06/07/12 per Dr. Leone Payor verbal order.   Lorre Munroe, PharmD 06/06/2012 8:52 PM

## 2012-06-06 NOTE — Progress Notes (Addendum)
ANTICOAGULATION CONSULT NOTE - Follow Up Consult  Pharmacy Consult for heparin Indication: DVT  Labs:  Basename 06/06/12 0547 06/06/12 0038 06/05/12 0936 06/04/12 2251 06/04/12 1455  HGB 7.5* -- 8.2* -- --  HCT 20.9* -- 23.1* -- 38.1*  PLT 120* -- 140* -- 152  APTT -- -- -- -- --  LABPROT -- -- -- 17.1* --  INR -- -- -- 1.37 --  HEPARINUNFRC 0.27* 0.21* -- -- --  CREATININE 0.70 -- 0.57 -- 0.61  CKTOTAL -- -- -- -- --  CKMB -- -- -- -- --  TROPONINI -- -- -- -- --    Assessment: 71yo male on heparin for RLE DVT.  Heparin level 4 hours after rate increased from 1000 units/hr to 1250 units/hr = 0.27.  This is slightly lower than 0.3 - 0.7 goal.  This is not steady state as level was ordered to be drawn 6 hours after rate change.  I am very concerned about Hg drop.  Admit Hg 13.1, yest 8.2, now 7.5.  No overt bleeding reported.  He did have hematuria in UA on admission. He also has cirrhosis and portal venous hypertension.    Goal of Therapy:  Heparin level 0.3-0.7 units/ml   Plan:  1. Keep heparin at 1250 units/hr 2. Repeat heparin level at steady-state and repeat CBC to f/u Hg. Herby Abraham, Pharm.D. [phone] 06/06/2012 8:33 AM

## 2012-06-06 NOTE — Progress Notes (Signed)
ANTICOAGULATION CONSULT NOTE - Follow Up Consult  Pharmacy Consult for heparin Indication: DVT  Labs:  Basename 06/06/12 0038 06/05/12 0936 06/04/12 2251 06/04/12 1455  HGB -- 8.2* -- 13.1  HCT -- 23.1* -- 38.1*  PLT -- 140* -- 152  APTT -- -- -- --  LABPROT -- -- 17.1* --  INR -- -- 1.37 --  HEPARINUNFRC 0.21* -- -- --  CREATININE -- 0.57 -- 0.61  CKTOTAL -- -- -- --  CKMB -- -- -- --  TROPONINI -- -- -- --    Assessment: 70yo male subtherapeutic on heparin with initial dosing for DVT.  Goal of Therapy:  Heparin level 0.3-0.7 units/ml   Plan:  Will increase heparin by ~3 units/kg/hr to 1250 units/hr and check level in 6hr.  Colleen Can PharmD BCPS 06/06/2012,1:40 AM

## 2012-06-06 NOTE — Progress Notes (Signed)
INITIAL ADULT NUTRITION ASSESSMENT Date: 06/06/2012   Time: 12:24 PM  Reason for Assessment: MD Consult  INTERVENTION: 1. Monitor magnesium, potassium, and phosphorus daily for at least 3 days, MD to replete as needed, as pt is at risk for refeeding syndrome given dx of acute severe malnutrition. 2. Downgrade diet to Dysphagia 3 given lack of teeth on upper portion of mouth and pt's report of difficulty chewing tougher cuts of meat 3. Ensure Complete po BID, each supplement provides 350 kcal and 13 grams of protein. 4. RD to continue to follow nutrition care plan   DOCUMENTATION CODES Per approved criteria  -Severe malnutrition in the context of acute illness or injury   ASSESSMENT: Male 71 y.o.  Dx: cellulitis  Hx:  Past Medical History  Diagnosis Date  . Neuropathy   . Hypertension   . Hepatitis C    History reviewed. No pertinent past surgical history.  Related Meds:     . azithromycin  500 mg Oral Daily   Followed by  . azithromycin  250 mg Oral Daily  . docusate sodium  100 mg Oral BID  . fluconazole  100 mg Oral Daily  . folic acid  1 mg Oral Daily  . furosemide  20 mg Oral BID  . levothyroxine  50 mcg Oral QAC breakfast  . LORazepam  0-4 mg Oral Q6H   Followed by  . LORazepam  0-4 mg Oral Q12H  . magnesium sulfate 1 - 4 g bolus IVPB  2 g Intravenous Once  . multivitamin with minerals  1 tablet Oral Daily  . pantoprazole  40 mg Oral Q1200  . piperacillin-tazobactam (ZOSYN)  IV  3.375 g Intravenous Q8H  . potassium chloride  40 mEq Oral Once  . potassium chloride  40 mEq Oral Daily  . sodium chloride  3 mL Intravenous Q12H  . sodium chloride  3 mL Intravenous Q12H  . spironolactone  12.5 mg Oral Daily  . thiamine  100 mg Oral Daily   Or  . thiamine  100 mg Intravenous Daily  . traZODone  100 mg Oral QHS  . vancomycin  750 mg Intravenous Q12H  . DISCONTD: heparin  2,500 Units Intravenous Once  . DISCONTD: heparin  3,000 Units Intravenous Once  . DISCONTD:  heparin  5,000 Units Subcutaneous Q8H   Ht: 6' (182.9 cm)  Wt: 156 lb (70.761 kg)  Ideal Wt: 80.9 kg % Ideal Wt: 88%  Wt Readings from Last 15 Encounters:  06/05/12 156 lb (70.761 kg)  Usual Wt: 175 lb - per patient % Usual Wt: 89%  Body mass index is 21.16 kg/(m^2). Weight is WNL  Food/Nutrition Related Hx: poor appetite x 1 month  Labs:  CMP     Component Value Date/Time   NA 135 06/06/2012 0547   K 3.2* 06/06/2012 0547   CL 105 06/06/2012 0547   CO2 23 06/06/2012 0547   GLUCOSE 104* 06/06/2012 0547   BUN 5* 06/06/2012 0547   CREATININE 0.70 06/06/2012 0547   CALCIUM 7.5* 06/06/2012 0547   PROT 5.2* 06/06/2012 0547   ALBUMIN 1.7* 06/06/2012 0547   AST 25 06/06/2012 0547   ALT 10 06/06/2012 0547   ALKPHOS 58 06/06/2012 0547   BILITOT 0.9 06/06/2012 0547   GFRNONAA >90 06/06/2012 0547   GFRAA >90 06/06/2012 0547   Sodium  Date/Time Value Range Status  06/06/2012  5:47 AM 135  135 - 145 mEq/L Final  06/05/2012  9:36 AM 137  135 - 145 mEq/L  Final  06/04/2012  2:55 PM 133* 135 - 145 mEq/L Final    Potassium  Date/Time Value Range Status  06/06/2012  5:47 AM 3.2* 3.5 - 5.1 mEq/L Final     DELTA CHECK NOTED  06/05/2012  9:36 AM 3.9  3.5 - 5.1 mEq/L Final  06/04/2012  2:55 PM 3.7  3.5 - 5.1 mEq/L Final    Phosphorus  Date/Time Value Range Status  06/05/2012  9:36 AM 3.7  2.3 - 4.6 mg/dL Final    Magnesium  Date/Time Value Range Status  06/05/2012  9:36 AM 1.1* 1.5 - 2.5 mg/dL Final    Intake/Output Summary (Last 24 hours) at 06/06/12 1226 Last data filed at 06/05/12 2300  Gross per 24 hour  Intake 298.33 ml  Output    300 ml  Net  -1.67 ml   Diet Order: General  Supplements/Tube Feeding: none  IVF:    heparin Last Rate: 1,250 Units/hr (06/06/12 0140)    Estimated Nutritional Needs:   Kcal: 1750 - 2000 kcal Protein: 70 - 84 grams Fluid: 1.7 - 2 liters daily  Pt with hx of Hep C and alcoholism. States intake has been very poor x 1 month. Says he hasn't had any appetite for solid foods  and has only been able to drink water, gingerale, juices. Pt states he tries to drink 2 Ensures daily at home. Would like to continue them while here.  Pt states usual weight is 175 lb and confirms at 20 lb wt loss x 1 month. This an 11% wt loss x 1 month. Pt meets criteria for severe malnutrition in the context of acute illness as evidenced by estimated energy intake of <50% of needs and wt loss x 11% x 1 month.  Pt without any teeth on top of mouth, states that he cannot afford a partial denture. Pt consumed approximately 50% of lunch today.  Reviewed labs. Pt with low magnesium and potassium - both currently being repleted at this time. Will continue to monitor these labs as pt is at risk for refeeding syndrome given dx of severe acute malnutrition.  NUTRITION DIAGNOSIS: -Inadequate oral intake (NI-2.1).  Status: Ongoing  RELATED TO: poor appetite  AS EVIDENCE BY: pt report of 1 month hx of very poor oral intake and 20 lb wt loss  MONITORING/EVALUATION(Goals): Goal: Pt to meet >/= 90% of their estimated nutrition needs Monitor: weight trends, lab trends, I/O's, PO intake, supplement tolerance  EDUCATION NEEDS: -Education not appropriate at this time  Jarold Motto MS, RD, LDN Pager: [phone] After-hours pager: [phone]

## 2012-06-06 NOTE — Progress Notes (Signed)
Peripherally Inserted Central Catheter/Midline Placement  The IV Nurse has discussed with the patient and/or persons authorized to consent for the patient, the purpose of this procedure and the potential benefits and risks involved with this procedure.  The benefits include less needle sticks, lab draws from the catheter and patient may be discharged home with the catheter.  Risks include, but not limited to, infection, bleeding, blood clot (thrombus formation), and puncture of an artery; nerve damage and irregular heat beat.  Alternatives to this procedure were also discussed.  PICC/Midline Placement Documentation        Nathan Frank 06/06/2012, 6:29 PM

## 2012-06-06 NOTE — Progress Notes (Signed)
Pt is refusing any further lab draws.  Lab tech has been by 3 times today to take blood work and Mr. Sapia is refusing lab sticks.  Dr. Brien Few was made aware, orders received for a picc line.  Nsg to continue to monitor.

## 2012-06-07 ENCOUNTER — Encounter (HOSPITAL_COMMUNITY)
Admission: EM | Disposition: A | Discharge status: Skilled Nursing Facility | Payer: Self-pay | Source: Home / Self Care | Attending: Internal Medicine

## 2012-06-07 DIAGNOSIS — R933 Abnormal findings on diagnostic imaging of other parts of digestive tract: Secondary | ICD-10-CM | POA: Diagnosis present

## 2012-06-07 DIAGNOSIS — K2970 Gastritis, unspecified, without bleeding: Secondary | ICD-10-CM

## 2012-06-07 DIAGNOSIS — K2990 Gastroduodenitis, unspecified, without bleeding: Secondary | ICD-10-CM

## 2012-06-07 DIAGNOSIS — J189 Pneumonia, unspecified organism: Secondary | ICD-10-CM

## 2012-06-07 HISTORY — PX: ESOPHAGOGASTRODUODENOSCOPY: SHX5428

## 2012-06-07 HISTORY — DX: Gastritis, unspecified, without bleeding: K29.70

## 2012-06-07 LAB — COMPREHENSIVE METABOLIC PANEL
AST: 22 U/L (ref 0–37)
Albumin: 1.8 g/dL — ABNORMAL LOW (ref 3.5–5.2)
Calcium: 7.6 mg/dL — ABNORMAL LOW (ref 8.4–10.5)
Creatinine, Ser: 0.7 mg/dL (ref 0.50–1.35)
GFR calc non Af Amer: >90 mL/min (ref >90)
Sodium: 138 mEq/L (ref 135–145)
Total Protein: 5.5 g/dL — ABNORMAL LOW (ref 6.0–8.3)

## 2012-06-07 LAB — PROTEIN S ACTIVITY: Protein S Activity: 72 % (ref 69–129)

## 2012-06-07 LAB — PROTEIN C ACTIVITY: Protein C Activity: 24 % — ABNORMAL LOW (ref 75–133)

## 2012-06-07 LAB — BETA-2-GLYCOPROTEIN I ABS, IGG/M/A
Beta-2 Glyco I IgG: 0 G Units (ref <20)
Beta-2-Glycoprotein I IgA: 8 A Units (ref <20)
Beta-2-Glycoprotein I IgM: 4 M Units (ref <20)

## 2012-06-07 LAB — POTASSIUM: Potassium: 3.9 mEq/L (ref 3.5–5.1)

## 2012-06-07 LAB — HEPARIN LEVEL (UNFRACTIONATED)
Heparin Unfractionated: 0.27 IU/mL — ABNORMAL LOW (ref 0.30–0.70)
Heparin Unfractionated: 0.17 IU/mL — ABNORMAL LOW (ref 0.30–0.70)

## 2012-06-07 LAB — CBC
MCHC: 35.6 g/dL (ref 30.0–36.0)
Platelets: 129 10*3/uL — ABNORMAL LOW (ref 150–400)
RDW: 14.6 % (ref 11.5–15.5)
WBC: 4.5 10*3/uL (ref 4.0–10.5)

## 2012-06-07 LAB — LUPUS ANTICOAGULANT PANEL
PTT Lupus Anticoagulant: 174.6 secs — ABNORMAL HIGH (ref 28.0–43.0)
PTTLA Confirmation: 7.8 secs (ref <8.0)

## 2012-06-07 LAB — CARDIOLIPIN ANTIBODIES, IGG, IGM, IGA: Anticardiolipin IgG: 5 GPL U/mL — ABNORMAL LOW (ref <23)

## 2012-06-07 LAB — MAGNESIUM: Magnesium: 1.2 mg/dL — ABNORMAL LOW (ref 1.5–2.5)

## 2012-06-07 SURGERY — EGD (ESOPHAGOGASTRODUODENOSCOPY)
Anesthesia: Moderate Sedation

## 2012-06-07 MED ORDER — FENTANYL CITRATE 0.05 MG/ML IJ SOLN
INTRAMUSCULAR | Status: AC
  Filled 2012-06-07: qty 2

## 2012-06-07 MED ORDER — POTASSIUM CHLORIDE CRYS ER 20 MEQ PO TBCR
40.0000 meq | EXTENDED_RELEASE_TABLET | Freq: Once | ORAL | Status: AC
  Administered 2012-06-07: 40 meq via ORAL

## 2012-06-07 MED ORDER — HEPARIN (PORCINE) IN NACL 100-0.45 UNIT/ML-% IJ SOLN
1450.0000 [IU]/h | INTRAMUSCULAR | Status: DC
  Administered 2012-06-07 – 2012-06-09 (×3): 1450 [IU]/h via INTRAVENOUS
  Filled 2012-06-07 (×4): qty 250

## 2012-06-07 MED ORDER — FENTANYL CITRATE 0.05 MG/ML IJ SOLN
INTRAMUSCULAR | Status: DC | PRN
  Administered 2012-06-07 (×2): 25 ug via INTRAVENOUS

## 2012-06-07 MED ORDER — MIDAZOLAM HCL 5 MG/ML IJ SOLN
INTRAMUSCULAR | Status: AC
  Filled 2012-06-07: qty 2

## 2012-06-07 MED ORDER — BUTAMBEN-TETRACAINE-BENZOCAINE 2-2-14 % EX AERO
INHALATION_SPRAY | CUTANEOUS | Status: DC | PRN
  Administered 2012-06-07: 2 via TOPICAL

## 2012-06-07 MED ORDER — DIPHENHYDRAMINE HCL 50 MG/ML IJ SOLN
INTRAMUSCULAR | Status: DC | PRN
  Administered 2012-06-07: 25 mg via INTRAVENOUS

## 2012-06-07 MED ORDER — DIPHENHYDRAMINE HCL 50 MG/ML IJ SOLN
INTRAMUSCULAR | Status: AC
  Filled 2012-06-07: qty 1

## 2012-06-07 MED ORDER — MIDAZOLAM HCL 10 MG/2ML IJ SOLN
INTRAMUSCULAR | Status: DC | PRN
  Administered 2012-06-07 (×2): 2 mg via INTRAVENOUS
  Administered 2012-06-07: 1 mg via INTRAVENOUS

## 2012-06-07 MED ORDER — MAGNESIUM SULFATE 40 MG/ML IJ SOLN
2.0000 g | Freq: Once | INTRAMUSCULAR | Status: AC
  Administered 2012-06-07: 2 g via INTRAVENOUS
  Filled 2012-06-07: qty 50

## 2012-06-07 NOTE — Interval H&P Note (Signed)
History and Physical Interval Note:  06/07/2012 9:51 AM  Nathan Frank  has presented today for surgery, with the diagnosis of cirrhosis-screen and treat for varices  The various methods of treatment have been discussed with the patient and family. After consideration of risks, benefits and other options for treatment, the patient has consented to  Procedure(s) (LRB): ESOPHAGOGASTRODUODENOSCOPY (EGD) (N/A) as a surgical intervention .  The patient's history has been reviewed, patient examined, no change in status, stable for surgery.  I have reviewed the patient's chart and labs.  Questions were answered to the patient's satisfaction.     Rob Bunting

## 2012-06-07 NOTE — Progress Notes (Signed)
[facility] Daily Rounding Note 06/07/2012, 8:42 AM  SUBJECTIVE:       Denies abd pain, n/v, sob, cough.  Sleeping well  OBJECTIVE:         Vital signs in last 24 hours:    Temp:  [98 F (36.7 C)-99.2 F (37.3 C)] 98.4 F (36.9 C) (09/03 0530) Pulse Rate:  [78-99] 88  (09/03 0530) Resp:  [18-20] 18  (09/03 0530) BP: (82-109)/(43-71) 91/61 mmHg (09/03 0530) SpO2:  [96 %-100 %] 99 % (09/03 0500) Last BM Date: 06/07/12 General: thin, not acutely ill-looking   Heart: RRR Chest: some soft ronchi in left base.  No dyspnea.   Abdomen: soft, NT, ND.  Active BS  Extremities: edema right > Left LE Neuro/Psych:  Pleasant, appropriate, no confusion.   Intake/Output from previous day: 09/02 0701 - 09/03 0700 In: 1280 [P.O.:480; Blood:350; IV Piggyback:450] Out: 2150 [Urine:2150]  Intake/Output this shift:    Lab Results:  Basename 06/06/12 2150 06/06/12 0547 06-18-2012 0936  WBC 4.3 5.9 3.9*  HGB 8.1* 7.5* 8.2*  HCT 22.4* 20.9* 23.1*  PLT 133* 120* 140*   BMET  Basename 06/06/12 0547 06-18-2012 0936 06/04/12 1455  NA 135 137 133*  K 3.2* 3.9 3.7  CL 105 106 102  CO2 23 23 19   GLUCOSE 104* 91 110*  BUN 5* 4* 4*  CREATININE 0.70 0.57 0.61  CALCIUM 7.5* 8.1* 8.5   LFT  Basename 06/06/12 0547 Jun 18, 2012 0936 06/04/12 1455  PROT 5.2* 5.7* 7.1  ALBUMIN 1.7* 2.0* 2.4*  AST 25 33 37  ALT 10 13 15   ALKPHOS 58 50 61  BILITOT 0.9 1.5* 0.8  BILIDIR -- -- --  IBILI -- -- --   PT/INR  Basename 06/04/12 2251  LABPROT 17.1*  INR 1.37   Hepatitis Panel No results found for this basename: HEPBSAG,HCVAB,HEPAIGM,HEPBIGM in the last 72 hours  Studies/Results: Dg Chest 2 View 18-Jun-2012  *RADIOLOGY REPORT*  Clinical Data: Weight loss.  Weakness.  CHEST - 2 VIEW  Comparison: Chest and rib films 10/20/2011.  Findings: The heart is mildly enlarged. No significant edema is present to suggest failure.  Emphysematous changes are present. Left lower lobe airspace disease is concerning for  pneumonia.  A left pleural effusion is present. Minimal airspace disease is present at the right lung base.  The visualized soft tissues and bony thorax are unremarkable.  IMPRESSION:  1.  Left pleural effusion and airspace disease is compatible with pneumonia. 2.  Mild cardiomegaly without failure. 3.  Minimal atelectasis at the right lung base.   Original Report Authenticated By: Jamesetta Orleans. MATTERN, M.D.    US Abdomen Complete 06/06/2012  *RADIOLOGY REPORT*  Clinical Data:  Abdominal pain  COMPLETE ABDOMINAL ULTRASOUND  Comparison:  CT 2012/06/18  Findings:  Gallbladder:  3 mm nonshadowing calculus.  No gallbladder wall thickening.  There is a small amount of pericholecystic fluid.  Common bile duct:  10 mm diameter without evident choledocholithiasis.  Liver:  Nodular contour without focal lesion or intrahepatic biliary ductal dilatation.  IVC:  Appears normal.  Pancreas:  Unremarkable, segments obscured by overlying bowel gas.  Spleen:  6.9 cm craniocaudal length, unremarkable.  Right Kidney:  10.2 cm. No hydronephrosis.  Well-preserved cortex. Normal size and parenchymal echotexture without focal abnormalities.  Left Kidney:  9.9 cm. No hydronephrosis.  Well-preserved cortex. Normal size and parenchymal echotexture without focal abnormalities.  Abdominal aorta:  Unremarkable, segments obscured by overlying bowel gas.  Note made of a  small amount of perihepatic and perisplenic ascites.  IMPRESSION:  1.  Cholelithiasis without other ultrasound evidence of cholecystitis. 2.  Small amount of perihepatic and perisplenic ascites. 3.  Nodular liver contour suggesting cirrhosis.   Original Report Authenticated By: Osa Craver, M.D.    Ct Abdomen Pelvis W Contrast 06/05/2012  *RADIOLOGY REPORT*  Clinical Data: Chronic liver disease.  Weight loss in the past month.  Generalized abdominal pain.  Hepatitis C.  Hypertension. Alcohol abuse.  Hematuria.  CT ABDOMEN AND PELVIS WITH CONTRAST  Technique:   Multidetector CT imaging of the abdomen and pelvis was performed following the standard protocol during bolus administration of intravenous contrast.  Contrast: 80mL OMNIPAQUE IOHEXOL 300 MG/ML  SOLN, 1 OMNIPAQUE IOHEXOL 300 MG/ML  SOLN  Comparison: None.  Findings: Mild subsegmental atelectasis at the right lung base. Patchy left base air space disease.  Mild cardiomegaly.  Bilateral gynecomastia.  Small bilateral pleural effusions.  Right cardiophrenic angle adenopathy is mild and likely reactive at 9 mm on image 12.  Moderate cirrhosis.  A lateral segment left liver lobe 9 mm lesion is well-circumscribed and likely a small cyst.  Portal vein and branches patent.  Hepatic veins not well evaluated secondary to technique and bolus timing.  Normal spleen.  There are paraesophageal collaterals suspicious for varices on image 13.  The proximal stomach is underdistended.  Mild pancreatic atrophy.  The gallbladder is mildly prominent.  Suspicion of at least one stone within.  Wall thickness of up to 7 mm.  No gross surrounding inflammatory changes.  The common duct measures 1.0 cm on coronal image 54, slightly dilated for patient age.  There is artifact in the region of the distal common duct on images 32 and 33.  This makes evaluation for choledocholithiasis difficult.  No splenic artery aneurysm.  Normal adrenal glands.  Mild renal cortical thinning bilaterally.  No retroperitoneal or retrocrural adenopathy.  Normal colon and terminal ileum.  The appendix is likely identified on image 59 and within normal limits.  Normal caliber of small bowel loops. No pneumatosis or free intraperitoneal air.  Trace perihepatic and perisplenic ascites.  No pelvic adenopathy.    Normal urinary bladder and prostate. Probable injection induced gas within the anterior abdominal wall. No acute osseous abnormality.  Marked lower lumbar spondylosis.  IMPRESSION:  1.  Moderate cirrhosis and portal venous hypertension. 2.  Gallbladder wall  thickening and probable stone or stones. Nonspecific in the setting of cirrhosis/portal venous hypertension. Could be secondary.  However, acute cholecystitis cannot be excluded.  If there are right upper quadrant symptoms, consider dedicated ultrasound. 3.  Small bilateral pleural effusions with left base pneumonia. 4.  Bilateral gynecomastia. 5.  Mild biliary ductal dilatation.  Artifact degradation in the region of the distal common duct.  Cannot exclude choledocholithiasis. If further imaging characterization is desired, consider MRCP.   Original Report Authenticated By: Consuello Bossier, M.D.    Dg Chest Port 1 View 06/06/2012  *RADIOLOGY REPORT*  Clinical Data: 71 year old male line placement.  PORTABLE CHEST - 1 VIEW  Comparison: 06/05/2012 and earlier.  Findings: Portable upright AP view 1854 hours.  Right side PICC line placed.  Tip at the level of the lower SVC.   Cardiac size and mediastinal contours are within normal limits. Visualized tracheal air column is within normal limits.  No pneumothorax or pulmonary edema.  Continued basilar predominant patchy opacity.  No large effusion.  Chronic left fourth rib fracture.  IMPRESSION: 1.  Right PICC  line, tip at the lower SVC level. 2.  Continued bibasilar opacity.   Original Report Authenticated By: Harley Hallmark, M.D.     ASSESMENT: *  Cirrhosis of liver.  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 Dr Marny Lowenstein at [facility] is the GI MD *  New DX DVT in setting of LE cellulitis, heparin turned off at 0800.  *  Alcoholism, active up to time of admission.  *  Hepatitis C  *  9 mm CBD with probable gallstone. *  Pneumonia and pleural effusions.  *  Anemia with FOB negative rectal exam 06/06/12. *  S/P PICC line *  Hypoalbuminemia.  *  Hypokalemia.    PLAN: *  EGD at 10 AM today.  *  Will let hospitalist address potassium supplementation     LOS: 3 days   Jennye Moccasin  06/07/2012, 8:42 AM Pager: [phone]

## 2012-06-07 NOTE — Progress Notes (Signed)
D/c'd Heparin drip at 0800 per MD order, 2 hours prior to Endo.  Spoke with Barth Kirks in Endo, patient will travel in bed with IV.  Patient may travel off telemetry per MD.  Will continue to monitor.

## 2012-06-07 NOTE — H&P (View-Only) (Signed)
   Nathan Frank Daily Rounding Note 06/07/2012, 8:42 AM  SUBJECTIVE:       Denies abd pain, n/v, sob, cough.  Sleeping well  OBJECTIVE:         Vital signs in last 24 hours:    Temp:  [98 F (36.7 C)-99.2 F (37.3 C)] 98.4 F (36.9 C) (09/03 0530) Pulse Rate:  [78-99] 88  (09/03 0530) Resp:  [18-20] 18  (09/03 0530) BP: (82-109)/(43-71) 91/61 mmHg (09/03 0530) SpO2:  [96 %-100 %] 99 % (09/03 0500) Last BM Date: 06/07/12 General: thin, not acutely ill-looking   Heart: RRR Chest: some soft ronchi in left base.  No dyspnea.   Abdomen: soft, NT, ND.  Active BS  Extremities: edema right > Left LE Neuro/Psych:  Pleasant, appropriate, no confusion.   Intake/Output from previous day: 09/02 0701 - 09/03 0700 In: 1280 [P.O.:480; Blood:350; IV Piggyback:450] Out: 2150 [Urine:2150]  Intake/Output this shift:    Lab Results:  Basename 06/06/12 2150 06/06/12 0547 06/05/12 0936  WBC 4.3 5.9 3.9*  HGB 8.1* 7.5* 8.2*  HCT 22.4* 20.9* 23.1*  PLT 133* 120* 140*   BMET  Basename 06/06/12 0547 06/05/12 0936 06/04/12 1455  NA 135 137 133*  K 3.2* 3.9 3.7  CL 105 106 102  CO2 23 23 19  GLUCOSE 104* 91 110*  BUN 5* 4* 4*  CREATININE 0.70 0.57 0.61  CALCIUM 7.5* 8.1* 8.5   LFT  Basename 06/06/12 0547 06/05/12 0936 06/04/12 1455  PROT 5.2* 5.7* 7.1  ALBUMIN 1.7* 2.0* 2.4*  AST 25 33 37  ALT 10 13 15  ALKPHOS 58 50 61  BILITOT 0.9 1.5* 0.8  BILIDIR -- -- --  IBILI -- -- --   PT/INR  Basename 06/04/12 2251  LABPROT 17.1*  INR 1.37   Hepatitis Panel No results found for this basename: HEPBSAG,HCVAB,HEPAIGM,HEPBIGM in the last 72 hours  Studies/Results: Dg Chest 2 View 06/05/2012  *RADIOLOGY REPORT*  Clinical Data: Weight loss.  Weakness.  CHEST - 2 VIEW  Comparison: Chest and rib films 10/20/2011.  Findings: The heart is mildly enlarged. No significant edema is present to suggest failure.  Emphysematous changes are present. Left lower lobe airspace disease is concerning for  pneumonia.  A left pleural effusion is present. Minimal airspace disease is present at the right lung base.  The visualized soft tissues and bony thorax are unremarkable.  IMPRESSION:  1.  Left pleural effusion and airspace disease is compatible with pneumonia. 2.  Mild cardiomegaly without failure. 3.  Minimal atelectasis at the right lung base.   Original Report Authenticated By: CHRISTOPHER W. MATTERN, M.D.    Us Abdomen Complete 06/06/2012  *RADIOLOGY REPORT*  Clinical Data:  Abdominal pain  COMPLETE ABDOMINAL ULTRASOUND  Comparison:  CT 06/05/2012  Findings:  Gallbladder:  3 mm nonshadowing calculus.  No gallbladder wall thickening.  There is a small amount of pericholecystic fluid.  Common bile duct:  10 mm diameter without evident choledocholithiasis.  Liver:  Nodular contour without focal lesion or intrahepatic biliary ductal dilatation.  IVC:  Appears normal.  Pancreas:  Unremarkable, segments obscured by overlying bowel gas.  Spleen:  6.9 cm craniocaudal length, unremarkable.  Right Kidney:  10.2 cm. No hydronephrosis.  Well-preserved cortex. Normal size and parenchymal echotexture without focal abnormalities.  Left Kidney:  9.9 cm. No hydronephrosis.  Well-preserved cortex. Normal size and parenchymal echotexture without focal abnormalities.  Abdominal aorta:  Unremarkable, segments obscured by overlying bowel gas.  Note made of a   small amount of perihepatic and perisplenic ascites.  IMPRESSION:  1.  Cholelithiasis without other ultrasound evidence of cholecystitis. 2.  Small amount of perihepatic and perisplenic ascites. 3.  Nodular liver contour suggesting cirrhosis.   Original Report Authenticated By: D. Benjamin Merrihew HASSELL III, M.D.    Ct Abdomen Pelvis W Contrast 06/05/2012  *RADIOLOGY REPORT*  Clinical Data: Chronic liver disease.  Weight loss in the past month.  Generalized abdominal pain.  Hepatitis C.  Hypertension. Alcohol abuse.  Hematuria.  CT ABDOMEN AND PELVIS WITH CONTRAST  Technique:   Multidetector CT imaging of the abdomen and pelvis was performed following the standard protocol during bolus administration of intravenous contrast.  Contrast: 80mL OMNIPAQUE IOHEXOL 300 MG/ML  SOLN, 1 OMNIPAQUE IOHEXOL 300 MG/ML  SOLN  Comparison: None.  Findings: Mild subsegmental atelectasis at the right lung base. Patchy left base air space disease.  Mild cardiomegaly.  Bilateral gynecomastia.  Small bilateral pleural effusions.  Right cardiophrenic angle adenopathy is mild and likely reactive at 9 mm on image 12.  Moderate cirrhosis.  A lateral segment left liver lobe 9 mm lesion is well-circumscribed and likely a small cyst.  Portal vein and branches patent.  Hepatic veins not well evaluated secondary to technique and bolus timing.  Normal spleen.  There are paraesophageal collaterals suspicious for varices on image 13.  The proximal stomach is underdistended.  Mild pancreatic atrophy.  The gallbladder is mildly prominent.  Suspicion of at least one stone within.  Wall thickness of up to 7 mm.  No gross surrounding inflammatory changes.  The common duct measures 1.0 cm on coronal image 54, slightly dilated for patient age.  There is artifact in the region of the distal common duct on images 32 and 33.  This makes evaluation for choledocholithiasis difficult.  No splenic artery aneurysm.  Normal adrenal glands.  Mild renal cortical thinning bilaterally.  No retroperitoneal or retrocrural adenopathy.  Normal colon and terminal ileum.  The appendix is likely identified on image 59 and within normal limits.  Normal caliber of small bowel loops. No pneumatosis or free intraperitoneal air.  Trace perihepatic and perisplenic ascites.  No pelvic adenopathy.    Normal urinary bladder and prostate. Probable injection induced gas within the anterior abdominal wall. No acute osseous abnormality.  Marked lower lumbar spondylosis.  IMPRESSION:  1.  Moderate cirrhosis and portal venous hypertension. 2.  Gallbladder wall  thickening and probable stone or stones. Nonspecific in the setting of cirrhosis/portal venous hypertension. Could be secondary.  However, acute cholecystitis cannot be excluded.  If there are right upper quadrant symptoms, consider dedicated ultrasound. 3.  Small bilateral pleural effusions with left base pneumonia. 4.  Bilateral gynecomastia. 5.  Mild biliary ductal dilatation.  Artifact degradation in the region of the distal common duct.  Cannot exclude choledocholithiasis. If further imaging characterization is desired, consider MRCP.   Original Report Authenticated By: KYLE D. TALBOT, M.D.    Dg Chest Port 1 View 06/06/2012  *RADIOLOGY REPORT*  Clinical Data: 70-year-old male line placement.  PORTABLE CHEST - 1 VIEW  Comparison: 06/05/2012 and earlier.  Findings: Portable upright AP view 1854 hours.  Right side PICC line placed.  Tip at the level of the lower SVC.   Cardiac size and mediastinal contours are within normal limits. Visualized tracheal air column is within normal limits.  No pneumothorax or pulmonary edema.  Continued basilar predominant patchy opacity.  No large effusion.  Chronic left fourth rib fracture.  IMPRESSION: 1.  Right PICC   line, tip at the lower SVC level. 2.  Continued bibasilar opacity.   Original Report Authenticated By: H.LEE HALL III, M.D.     ASSESMENT: *  Cirrhosis of liver.  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 Dr Ghandi at [facility] is the GI MD *  New DX DVT in setting of LE cellulitis, heparin turned off at 0800.  *  Alcoholism, active up to time of admission.  *  Hepatitis C  *  9 mm CBD with probable gallstone. *  Pneumonia and pleural effusions.  *  Anemia with FOB negative rectal exam 06/06/12. *  S/P PICC line *  Hypoalbuminemia.  *  Hypokalemia.    PLAN: *  EGD at 10 AM today.  *  Will let hospitalist address potassium supplementation     LOS: 3 days   Sarah Gribbin  06/07/2012, 8:42 AM Pager: [phone]    

## 2012-06-07 NOTE — Op Note (Signed)
[facility] [address] [place], [address]   ENDOSCOPY PROCEDURE REPORT  PATIENT: Nathan Frank, Nathan Frank  MR#: [id number] BIRTHDATE: January 26, 1941 , 70  yrs. old GENDER: Male ENDOSCOPIST: Rachael Fee, MD PROCEDURE DATE:  06/07/2012 PROCEDURE:  EGD, diagnostic ASA CLASS:     Class III INDICATIONS:  known EtOH cirrhosis, now with DVT and signs of portal hypertension on imaging; [facility]enterologist at [facility] [place] has done EGD and colonoscopy in past 1-2 years, current FOB negative and no overt bleeding. MEDICATIONS: Benadryl 25 mg IV, Fentanyl 50 mcg IV, and Versed 5 mg IV TOPICAL ANESTHETIC: Cetacaine Spray  DESCRIPTION OF PROCEDURE: After the risks benefits and alternatives of the procedure were thoroughly explained, informed consent was obtained.  The Pentax Gastroscope [id number] endoscope was introduced through the mouth and advanced to the second portion of the duodenum. Without limitations.  The instrument was slowly withdrawn as the mucosa was fully examined.    Mild gastritis (inflammation) was found in the entire examined stomach.   The remainder of the upper endoscopy exam was otherwise normal.  Retroflexed views revealed no abnormalities.     The scope was then withdrawn from the patient and the procedure completed. COMPLICATIONS: There were no complications.  ENDOSCOPIC IMPRESSION: 1.   Mild gastritis was found in the entire examined stomach, but there were no signs of portal hypertension 2.   The remainder of the upper endoscopy exam was otherwise normal  RECOMMENDATIONS: OK to anticoagulate however is needed from GI perspective.  He should follow up with his [facility] gastroenterologist as needed.  Obviously he should stop drinking alcohol and if he does not, his cirrhosis will likely worsen, leading to liver failure and this can potentially be fatal.    eSigned:  Rachael Fee, MD 06/07/2012 10:19 AM   XB:JYNW Leone Payor,  MD

## 2012-06-07 NOTE — Progress Notes (Signed)
ANTICOAGULATION CONSULT NOTE - Follow Up Consult  Pharmacy Consult for heparin Indication: DVT  Labs:  Basename 06/07/12 2129 06/07/12 0835 06/06/12 2150 06/06/12 1942 06/06/12 0547 06/05/12 0936 06/04/12 2251  HGB -- 10.4* 8.1* -- -- -- --  HCT -- 29.2* 22.4* -- 20.9* -- --  PLT -- 129* 133* -- 120* -- --  APTT -- -- -- -- -- -- --  LABPROT -- -- -- -- -- -- 17.1*  INR -- -- -- -- -- -- 1.37  HEPARINUNFRC 0.17* 0.27* -- 0.46 -- -- --  CREATININE -- 0.70 -- -- 0.70 0.57 --  CKTOTAL -- -- -- -- -- -- --  CKMB -- -- -- -- -- -- --  TROPONINI -- -- -- -- -- -- --    Assessment: 70yo male on heparin for RLE DVT. Heparin level is now subtherapeutic at 0.17. Noted previous hematuria but no further bleeding noted. Hemoglobin much improved s/p PRBC on 9/2.   Goal of Therapy:  Heparin level 0.3-0.7 units/ml   Plan:  1. Increase heparin to 1450 units/hr 2. Check an 8 hour heparin level  Lysle Pearl, PharmD, BCPS Pager # [phone] 06/07/2012 10:15 PM

## 2012-06-07 NOTE — Progress Notes (Signed)
[facility] PROGRESS NOTE  Nathan Frank ION:[id number] DOB: 1941/07/23 DOA: 06/04/2012 PCP: Pcp Not In System  Assessment/Plan: Active Problems:  Cellulitis  Alcohol abuse  Weight loss  Hepatitis C  Cirrhosis  Hematuria  Oral thrush  DVT of lower limb, acute  Anemia  Nonspecific (abnormal) findings on radiological and other examination of gastrointestinal tract  Gastritis    1. RLE Cellulitis: Patient presented with increased swelling, pain and redness of RLE, in the setting of severe lower extremity edema, likely secondary to hypoalbuminemia. On iv Vancomycin, day#4/Zosyn, day#3. Inflammatory phenomena have improved, wcc is normal, and he has had no pyrexia so far.  2. RLE DVT: Given disparity in circumference of lower extremities, Venous doppler was done on 06/05/12, and revealed an acute DVT noted in the right femoral and popliteal veins. He has been commenced on iv Heparin infusion, from 06/05/12.  3. Alcohol abuse: Patient is a chronic alcoholic, and has expressed interest in quitting. He has been placed on CIWA protocol, vitamin supplements and counseled. So far, he has no clinical features of alcohol withdrawal.  4. Weight loss/Liver cirrhosis: Patient has lost about 20 pounds in the last one month. He has a known history of chronic hepatitis C/chronic liver disease. 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  AFP tumor marker is negative at 3.5. Per patient, he has had EGD about a year ago, which was negative for varices. Dr Stan Head provided gastroenterologist consultation. Patient is s/p EGD on 06/07/12 bu Dr Rob Bunting, which revealed mild gastritis, but no evidence of portal HTN. Will await recommendation, with regards to possible colonoscopy, in view of anemia, and need for anticoagulation.  5. Hepatitis C: Patient is currently not being treated secondary to alcohol abuse.  6. Anemia: Patient had a hemoglobin of 13.1 at presentation, with MCV of 88.5. His  hemoglobin has since dropped to 7.5 on 06/06/12. He has no hematemesis or melena, stool guaiac is negative (Done by GI on 06/06/12), and abdominal imaging did not show retroperitoneal bleed. Transfused 2 units PRBC on 06/06/12, with satisfactory bump in hemoglobin to 10.4.  7. Hematuria: Urinalysis revealed microhematuria. Abdominal ultrasound has been done, and showed no urinary tract abnormalities. .  8. Oral thrush: This was an incidental finding on physical examination. Treating with a 7-day course of Diflucan. Now day#3.  9. Severe protein-calorie malnutrition: Patient appears underweight, and has albumin of 2.4, as well as peripheral edema, consistent with malnutrition. Have requested nutritionist consultation.  10. Hypokalemia/Hypomagnesemia: Repleting as indicated.  11. CAP: CXR of 06/05/12, demonstrated left pleural effusion and airspace disease is compatible with Pneumonia. Patient has no cough, and is already on broad spectrum antibiotics, as described above. Added oral Azithromycin on 06/06/12, to cover atypicals.      Code Status: Full Code.  Family Communication:  Disposition Plan: To be determined.    Brief narrative:  71 y.o. male with history of Neuropathy, Hypertension, Hepatitis C and chronic alcoholism, presenting with intermittent bilateral lower extremity swelling, much worse in right leg over the past 2 days with broken skin and oozing fluid, but no fever, chills or dyspnea. He has lost a lot of weight and has no appetite.  Consultants:  N/A.  Procedures:  Pending.   Antibiotics:  Vancomycin started 06/04/12.   Zosyn started 06/05/12.  Azithromycin 06/06/12>>>  HPI/Subjective: Feels better.  Objective: Vital signs in last 24 hours: Temp:  [98 F (36.7 C)-99.3 F (37.4 C)] 99.3 F (37.4 C) (09/03 0935) Pulse Rate:  [78-99]  94  (09/03 0935) Resp:  [11-25] 25  (09/03 1100) BP: (82-123)/(43-79) 111/72 mmHg (09/03 1100) SpO2:  [95 %-100 %] 95 % (09/03 1045) Weight  change:  Last BM Date: 06/07/12  Intake/Output from previous day: 09/02 0701 - 09/03 0700 In: 1280 [P.O.:480; Blood:350; IV Piggyback:450] Out: 2150 [Urine:2150]     Physical Exam: General: Comfortable, alert, communicative, fully oriented, not short of breath at rest.  HEENT:  No clinical pallor, no jaundice, no conjunctival injection or discharge. Hydration appear fair. Patient has oral thrush.  NECK:  Supple, JVP not seen, no carotid bruits, no palpable lymphadenopathy, no palpable goiter. CHEST:  Clinically clear to auscultation, no wheezes, no crackles. HEART:  Sounds 1 and 2 heard, normal, regular, no murmurs. ABDOMEN:  Full, soft, no scars, non-tender, no palpable organomegaly, no palpable masses, normal bowel sounds. No clinical ascites.  GENITALIA:  Not examined. LOWER EXTREMITIES:  Moderate pitting edema, palpable peripheral pulses, RLE swelling is less and excoriated/denuded skin, is less erythematous.  MUSCULOSKELETAL SYSTEM:  Generalized osteoarthritic changes, otherwise, normal. CENTRAL NERVOUS SYSTEM:  No focal neurologic deficit on gross examination.  Lab Results:  Basename 06/07/12 0835 06/06/12 2150  WBC 4.5 4.3  HGB 10.4* 8.1*  HCT 29.2* 22.4*  PLT 129* 133*    Basename 06/07/12 0835 06/06/12 0547  NA 138 135  K 3.0* 3.2*  CL 104 105  CO2 25 23  GLUCOSE 84 104*  BUN 5* 5*  CREATININE 0.70 0.70  CALCIUM 7.6* 7.5*   Recent Results (from the past 240 hour(s))  URINE CULTURE     Status: Normal   Collection Time   06/04/12  9:14 PM      Component Value Range Status Comment   Specimen Description URINE, CLEAN CATCH   Final    Special Requests NONE   Final    Culture  Setup Time 06/05/2012 03:24   Final    Colony Count 10,000 COLONIES/ML   Final    Culture     Final    Value: Multiple bacterial morphotypes present, none predominant. Suggest appropriate recollection if clinically indicated.   Report Status 06/06/2012 FINAL   Final   CULTURE, BLOOD  (ROUTINE X 2)     Status: Normal (Preliminary result)   Collection Time   06/05/12  9:50 AM      Component Value Range Status Comment   Specimen Description BLOOD ARM LEFT   Final    Special Requests BOTTLES DRAWN AEROBIC ONLY 5CC   Final    Culture  Setup Time 06/05/2012 15:02   Final    Culture     Final    Value:        BLOOD CULTURE RECEIVED NO GROWTH TO DATE CULTURE WILL BE HELD FOR 5 DAYS BEFORE ISSUING A FINAL NEGATIVE REPORT   Report Status PENDING   Incomplete      Studies/Results: Dg Chest 2 View  06/05/2012  *RADIOLOGY REPORT*  Clinical Data: Weight loss.  Weakness.  CHEST - 2 VIEW  Comparison: Chest and rib films 10/20/2011.  Findings: The heart is mildly enlarged. No significant edema is present to suggest failure.  Emphysematous changes are present. Left lower lobe airspace disease is concerning for pneumonia.  A left pleural effusion is present. Minimal airspace disease is present at the right lung base.  The visualized soft tissues and bony thorax are unremarkable.  IMPRESSION:  1.  Left pleural effusion and airspace disease is compatible with pneumonia. 2.  Mild cardiomegaly without failure. 3.  Minimal atelectasis at the right lung base.   Original Report Authenticated By: Jamesetta Orleans. MATTERN, M.D.    US Abdomen Complete  06/06/2012  *RADIOLOGY REPORT*  Clinical Data:  Abdominal pain  COMPLETE ABDOMINAL ULTRASOUND  Comparison:  CT 06/05/2012  Findings:  Gallbladder:  3 mm nonshadowing calculus.  No gallbladder wall thickening.  There is a small amount of pericholecystic fluid.  Common bile duct:  10 mm diameter without evident choledocholithiasis.  Liver:  Nodular contour without focal lesion or intrahepatic biliary ductal dilatation.  IVC:  Appears normal.  Pancreas:  Unremarkable, segments obscured by overlying bowel gas.  Spleen:  6.9 cm craniocaudal length, unremarkable.  Right Kidney:  10.2 cm. No hydronephrosis.  Well-preserved cortex. Normal size and parenchymal echotexture  without focal abnormalities.  Left Kidney:  9.9 cm. No hydronephrosis.  Well-preserved cortex. Normal size and parenchymal echotexture without focal abnormalities.  Abdominal aorta:  Unremarkable, segments obscured by overlying bowel gas.  Note made of a small amount of perihepatic and perisplenic ascites.  IMPRESSION:  1.  Cholelithiasis without other ultrasound evidence of cholecystitis. 2.  Small amount of perihepatic and perisplenic ascites. 3.  Nodular liver contour suggesting cirrhosis.   Original Report Authenticated By: Osa Craver, M.D.    Ct Abdomen Pelvis W Contrast  06/05/2012  *RADIOLOGY REPORT*  Clinical Data: Chronic liver disease.  Weight loss in the past month.  Generalized abdominal pain.  Hepatitis C.  Hypertension. Alcohol abuse.  Hematuria.  CT ABDOMEN AND PELVIS WITH CONTRAST  Technique:  Multidetector CT imaging of the abdomen and pelvis was performed following the standard protocol during bolus administration of intravenous contrast.  Contrast: 80mL OMNIPAQUE IOHEXOL 300 MG/ML  SOLN, 1 OMNIPAQUE IOHEXOL 300 MG/ML  SOLN  Comparison: None.  Findings: Mild subsegmental atelectasis at the right lung base. Patchy left base air space disease.  Mild cardiomegaly.  Bilateral gynecomastia.  Small bilateral pleural effusions.  Right cardiophrenic angle adenopathy is mild and likely reactive at 9 mm on image 12.  Moderate cirrhosis.  A lateral segment left liver lobe 9 mm lesion is well-circumscribed and likely a small cyst.  Portal vein and branches patent.  Hepatic veins not well evaluated secondary to technique and bolus timing.  Normal spleen.  There are paraesophageal collaterals suspicious for varices on image 13.  The proximal stomach is underdistended.  Mild pancreatic atrophy.  The gallbladder is mildly prominent.  Suspicion of at least one stone within.  Wall thickness of up to 7 mm.  No gross surrounding inflammatory changes.  The common duct measures 1.0 cm on coronal image 54,  slightly dilated for patient age.  There is artifact in the region of the distal common duct on images 32 and 33.  This makes evaluation for choledocholithiasis difficult.  No splenic artery aneurysm.  Normal adrenal glands.  Mild renal cortical thinning bilaterally.  No retroperitoneal or retrocrural adenopathy.  Normal colon and terminal ileum.  The appendix is likely identified on image 59 and within normal limits.  Normal caliber of small bowel loops. No pneumatosis or free intraperitoneal air.  Trace perihepatic and perisplenic ascites.  No pelvic adenopathy.    Normal urinary bladder and prostate. Probable injection induced gas within the anterior abdominal wall. No acute osseous abnormality.  Marked lower lumbar spondylosis.  IMPRESSION:  1.  Moderate cirrhosis and portal venous hypertension. 2.  Gallbladder wall thickening and probable stone or stones. Nonspecific in the setting of cirrhosis/portal venous hypertension. Could be secondary.  However, acute  cholecystitis cannot be excluded.  If there are right upper quadrant symptoms, consider dedicated ultrasound. 3.  Small bilateral pleural effusions with left base pneumonia. 4.  Bilateral gynecomastia. 5.  Mild biliary ductal dilatation.  Artifact degradation in the region of the distal common duct.  Cannot exclude choledocholithiasis. If further imaging characterization is desired, consider MRCP.   Original Report Authenticated By: Consuello Bossier, M.D.    Dg Chest Port 1 View  06/06/2012  *RADIOLOGY REPORT*  Clinical Data: 71 year old male line placement.  PORTABLE CHEST - 1 VIEW  Comparison: 06/05/2012 and earlier.  Findings: Portable upright AP view 1854 hours.  Right side PICC line placed.  Tip at the level of the lower SVC.   Cardiac size and mediastinal contours are within normal limits. Visualized tracheal air column is within normal limits.  No pneumothorax or pulmonary edema.  Continued basilar predominant patchy opacity.  No large effusion.   Chronic left fourth rib fracture.  IMPRESSION: 1.  Right PICC line, tip at the lower SVC level. 2.  Continued bibasilar opacity.   Original Report Authenticated By: Harley Hallmark, M.D.     Medications: Scheduled Meds:    . azithromycin  500 mg Oral Daily   Followed by  . azithromycin  250 mg Oral Daily  . docusate sodium  100 mg Oral BID  . feeding supplement  237 mL Oral BID BM  . fluconazole  100 mg Oral Daily  . folic acid  1 mg Oral Daily  . furosemide  20 mg Intravenous Once  . furosemide  20 mg Intravenous Once  . furosemide  20 mg Oral BID  . levothyroxine  50 mcg Oral QAC breakfast  . LORazepam  0-4 mg Oral Q6H   Followed by  . LORazepam  0-4 mg Oral Q12H  . magnesium sulfate 1 - 4 g bolus IVPB  2 g Intravenous Once  . multivitamin with minerals  1 tablet Oral Daily  . pantoprazole  40 mg Oral Q1200  . piperacillin-tazobactam (ZOSYN)  IV  3.375 g Intravenous Q8H  . potassium chloride  40 mEq Oral Once  . potassium chloride  40 mEq Oral Daily  . sodium chloride  3 mL Intravenous Q12H  . sodium chloride  3 mL Intravenous Q12H  . spironolactone  12.5 mg Oral Daily  . thiamine  100 mg Oral Daily   Or  . thiamine  100 mg Intravenous Daily  . traZODone  100 mg Oral QHS  . vancomycin  750 mg Intravenous Q12H   Continuous Infusions:    . heparin    . DISCONTD: heparin 1,250 Units/hr (06/06/12 1400)   PRN Meds:.sodium chloride, albuterol, alum & mag hydroxide-simeth, butamben-tetracaine-benzocaine, diphenhydrAMINE, fentaNYL, guaiFENesin-dextromethorphan, LORazepam, LORazepam, midazolam, morphine injection, ondansetron (ZOFRAN) IV, ondansetron, sodium chloride, sodium chloride, traMADol    LOS: 3 days   Jearldean Gutt,CHRISTOPHER  [facility] Pager [phone]. If 8PM-8AM, please contact night-coverage at [email or web address], password [facility] 06/07/2012, 11:28 AM  LOS: 3 days

## 2012-06-07 NOTE — Progress Notes (Addendum)
ANTICOAGULATION CONSULT NOTE - Follow Up Consult  Pharmacy Consult for heparin Indication: DVT  Labs:  Basename 06/07/12 0835 06/06/12 2150 06/06/12 1942 06/06/12 0547 06/05/12 0936 06/04/12 2251  HGB 10.4* 8.1* -- -- -- --  HCT 29.2* 22.4* -- 20.9* -- --  PLT 129* 133* -- 120* -- --  APTT -- -- -- -- -- --  LABPROT -- -- -- -- -- 17.1*  INR -- -- -- -- -- 1.37  HEPARINUNFRC 0.27* -- 0.46 0.27* -- --  CREATININE 0.70 -- -- 0.70 0.57 --  CKTOTAL -- -- -- -- -- --  CKMB -- -- -- -- -- --  TROPONINI -- -- -- -- -- --    Assessment: 71yo male on heparin for RLE DVT. Heparin level (0.46) was at-goal on 1250 units/hr.  Heparin to be resumed post endoscopy.  Endoscopy revealed gastritis otherwise normal.  Goal of Therapy:  Heparin level 0.3-0.7 units/ml   Plan:  1. Continue IV heparin at 1250 units/hr 2. Heparin level in 6 hours.   Talbert Cage, PharmD 06/07/2012 2:27 PM

## 2012-06-08 ENCOUNTER — Encounter (HOSPITAL_COMMUNITY): Payer: Self-pay

## 2012-06-08 ENCOUNTER — Encounter (HOSPITAL_COMMUNITY): Payer: Self-pay | Admitting: Gastroenterology

## 2012-06-08 LAB — TYPE AND SCREEN
ABO/RH(D): O POS
Antibody Screen: NEGATIVE
Unit division: 0
Unit division: 0

## 2012-06-08 LAB — MAGNESIUM: Magnesium: 1.6 mg/dL (ref 1.5–2.5)

## 2012-06-08 LAB — PROTEIN C, TOTAL: Protein C, Total: 22 % — ABNORMAL LOW (ref 72–160)

## 2012-06-08 LAB — CBC
HCT: 29.5 % — ABNORMAL LOW (ref 39.0–52.0)
MCH: 31.7 pg (ref 26.0–34.0)
MCV: 89.9 fL (ref 78.0–100.0)
RBC: 3.28 MIL/uL — ABNORMAL LOW (ref 4.22–5.81)
RDW: 15.3 % (ref 11.5–15.5)
WBC: 4.9 10*3/uL (ref 4.0–10.5)

## 2012-06-08 LAB — BASIC METABOLIC PANEL
BUN: 6 mg/dL (ref 6–23)
CO2: 25 mEq/L (ref 19–32)
Calcium: 7.8 mg/dL — ABNORMAL LOW (ref 8.4–10.5)
Chloride: 109 mEq/L (ref 96–112)
Creatinine, Ser: 0.68 mg/dL (ref 0.50–1.35)

## 2012-06-08 LAB — FACTOR 5 LEIDEN

## 2012-06-08 MED ORDER — GABAPENTIN 100 MG PO CAPS
100.0000 mg | ORAL_CAPSULE | Freq: Three times a day (TID) | ORAL | Status: DC
  Administered 2012-06-08 – 2012-06-11 (×9): 100 mg via ORAL
  Filled 2012-06-08 (×12): qty 1

## 2012-06-08 NOTE — Progress Notes (Signed)
ANTICOAGULATION CONSULT NOTE - Follow Up Consult  Pharmacy Consult for heparin Indication: DVT  Labs:  Basename 06/08/12 0600 06/08/12 0500 06/07/12 2129 06/07/12 0835 06/06/12 2150 06/06/12 0547 06/05/12 0936  HGB -- 10.4* -- 10.4* -- -- --  HCT -- 29.5* -- 29.2* 22.4* -- --  PLT -- 122* -- 129* 133* -- --  APTT -- -- -- -- -- -- --  LABPROT -- -- -- -- -- -- --  INR -- -- -- -- -- -- --  HEPARINUNFRC 0.41 -- 0.17* 0.27* -- -- --  CREATININE -- -- -- 0.70 -- 0.70 0.57  CKTOTAL -- -- -- -- -- -- --  CKMB -- -- -- -- -- -- --  TROPONINI -- -- -- -- -- -- --    Assessment/Plan:  71yo male now therapeutic on heparin for DVT.  Will continue gtt at current rate and confirm stable with additional level.  Colleen Can PharmD BCPS 06/08/2012,7:15 AM

## 2012-06-08 NOTE — Progress Notes (Addendum)
Nutrition Follow-up  Intervention:   1.  Brief education; provided.  Left handouts re:  "Low Sodium Nutrition Therapy" for pt to review.  RD contact information provided on handout.  Will continue to follow.  Assessment:   Pt resting comfortably at time of visit. Pt states he is eating well and his appetite is improving.  Pt states he would like more information on what to eat at home.  Pt is currently on Heart Healthy diet. RD returned to provide pt with printed materials and review Low Sodium eating principles, however pt had fallen soundly asleep.  RD left materials with contact information for pt to review.   RD will continue to follow along and assist with intake and education.  Consuming Ensure Complete BID.  Diet Order:  Heart healthy  Meds: Scheduled Meds:   . azithromycin  250 mg Oral Daily  . docusate sodium  100 mg Oral BID  . feeding supplement  237 mL Oral BID BM  . fluconazole  100 mg Oral Daily  . folic acid  1 mg Oral Daily  . furosemide  20 mg Oral BID  . levothyroxine  50 mcg Oral QAC breakfast  . LORazepam  0-4 mg Oral Q12H  . multivitamin with minerals  1 tablet Oral Daily  . pantoprazole  40 mg Oral Q1200  . piperacillin-tazobactam (ZOSYN)  IV  3.375 g Intravenous Q8H  . potassium chloride  40 mEq Oral Daily  . potassium chloride  40 mEq Oral Once  . sodium chloride  3 mL Intravenous Q12H  . sodium chloride  3 mL Intravenous Q12H  . spironolactone  12.5 mg Oral Daily  . thiamine  100 mg Oral Daily   Or  . thiamine  100 mg Intravenous Daily  . traZODone  100 mg Oral QHS  . vancomycin  750 mg Intravenous Q12H   Continuous Infusions:   . heparin 1,450 Units/hr (06/07/12 2245)  . DISCONTD: heparin 1,250 Units/hr (06/07/12 1259)   PRN Meds:.sodium chloride, albuterol, alum & mag hydroxide-simeth, guaiFENesin-dextromethorphan, LORazepam, LORazepam, morphine injection, ondansetron (ZOFRAN) IV, ondansetron, sodium chloride, sodium chloride, traMADol  Labs:   CMP     Component Value Date/Time   NA 140 06/08/2012 0500   K 4.3 06/08/2012 0500   CL 109 06/08/2012 0500   CO2 25 06/08/2012 0500   GLUCOSE 96 06/08/2012 0500   BUN 6 06/08/2012 0500   CREATININE 0.68 06/08/2012 0500   CALCIUM 7.8* 06/08/2012 0500   PROT 5.5* 06/07/2012 0835   ALBUMIN 1.8* 06/07/2012 0835   AST 22 06/07/2012 0835   ALT 10 06/07/2012 0835   ALKPHOS 60 06/07/2012 0835   BILITOT 0.8 06/07/2012 0835   GFRNONAA >90 06/08/2012 0500   GFRAA >90 06/08/2012 0500   Sodium  Date/Time Value Range Status  06/08/2012  5:00 AM 140  135 - 145 mEq/L Final  06/07/2012  8:35 AM 138  135 - 145 mEq/L Final  06/06/2012  5:47 AM 135  135 - 145 mEq/L Final    Potassium  Date/Time Value Range Status  06/08/2012  5:00 AM 4.3  3.5 - 5.1 mEq/L Final  06/07/2012  5:53 PM 3.9  3.5 - 5.1 mEq/L Final     DELTA CHECK NOTED  06/07/2012  8:35 AM 3.0* 3.5 - 5.1 mEq/L Final    Phosphorus  Date/Time Value Range Status  06/05/2012  9:36 AM 3.7  2.3 - 4.6 mg/dL Final    Magnesium  Date/Time Value Range Status  06/08/2012  5:00 AM  1.6  1.5 - 2.5 mg/dL Final  4/0/9811  9:14 AM 1.2* 1.5 - 2.5 mg/dL Final  04/12/2955  2:13 AM 1.1* 1.5 - 2.5 mg/dL Final    Intake/Output Summary (Last 24 hours) at 06/08/12 1458 Last data filed at 06/08/12 1300  Gross per 24 hour  Intake      0 ml  Output    200 ml  Net   -200 ml    Weight Status:  Now new wt  Re-estimated needs:  1750-2000 kcal, 70-84g protein  Nutrition Dx:  Inadequate oral intake, ongoing with marked improvement. New Nutrition Dx:  Nutrition-related knowledge deficit r/t Low sodium nutrition therapy AEB pt with questions.  Monitor:   1.  Food/Beverage; improvement in intake to >/=75% of meals, continued supplements.  Met, ongoing. 2.  Wt/wt change; deter loss. Not met, no new wt. Continue 3.  Nutrition-related labs; monitor for refeeding risk.  Ongoing.  Continued replacements. 4.  Knowledge; for questions.  Ongoing.   Loyce Dys, MS RD LDN Clinical Inpatient  Dietitian Pager: [phone] Weekend/After hours pager: [phone]

## 2012-06-08 NOTE — Progress Notes (Signed)
Received call from 4N monitor tech that patient had 30 second run of SVT. MD was notified and is aware.

## 2012-06-08 NOTE — Progress Notes (Signed)
0130 Monitor tech called to notify that patient was in Nonsustained V-tach. Then she called back and stated he was in [place]. Told the monitor tech that I was in the room with patient and he was sleeping with eyes closed. Denies SOB or chest pain. Looked at the script and verified with another RN, that the script was not true V-tach. Was showing some artifact. HR was stable at 84 unchanged. VS: 97.9, 84, 18, 103/72, 99% on RA. Will continue to monitor.

## 2012-06-08 NOTE — Progress Notes (Signed)
[facility] PROGRESS NOTE  Nathan Frank NFA:[id number] DOB: 1940-10-15 DOA: 06/04/2012 PCP: Pcp Not In System  Assessment/Plan: Active Problems:  Cellulitis  Alcohol abuse  Weight loss  Hepatitis C  Cirrhosis  Hematuria  Oral thrush  DVT of lower limb, acute  Anemia  Nonspecific (abnormal) findings on radiological and other examination of gastrointestinal tract  Gastritis  CAP (community acquired pneumonia)    1. RLE Cellulitis: Patient presented with increased swelling, pain and redness of RLE, in the setting of severe lower extremity edema, likely secondary to hypoalbuminemia. On iv Vancomycin, day#5/Zosyn, day#4. Inflammatory phenomena have improved, wbc is normal, and he has had no pyrexia so far.  2. RLE DVT: Given disparity in circumference of lower extremities, Venous doppler was done on 06/05/12, and revealed an acute DVT noted in the right femoral and popliteal veins. He has been commenced on iv Heparin infusion, from 06/05/12.  3. Alcohol abuse: Patient is a chronic alcoholic, and has expressed interest in quitting. He has been placed on CIWA protocol, vitamin supplements and counseled. So far, he has no clinical features of alcohol withdrawal.  4. Weight loss/Liver cirrhosis: Patient has lost about 20 pounds in the last one month. He has a known history of chronic hepatitis C/chronic liver disease. 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  AFP tumor marker is negative at 3.5. Per patient, he has had EGD about a year ago, which was negative for varices. Dr Stan Head provided gastroenterologist consultation. Patient is s/p EGD on 06/07/12 bu Dr Rob Bunting, which revealed mild gastritis, but no evidence of portal HTN. Will await recommendation, with regards to possible colonoscopy, in view of anemia, and need for anticoagulation.  5. Hepatitis C: Patient is currently not being treated secondary to alcohol abuse.  6. Anemia: Patient had a hemoglobin of 13.1 at  presentation, with MCV of 88.5. His hemoglobin has since dropped to 7.5 on 06/06/12. He has no hematemesis or melena, stool guaiac is negative (Done by GI on 06/06/12), and abdominal imaging did not show retroperitoneal bleed. Transfused 2 units PRBC on 06/06/12, with satisfactory bump in hemoglobin to 10.4.  7. Hematuria: Urinalysis revealed microhematuria. Abdominal ultrasound has been done, and showed no urinary tract abnormalities. .  8. Oral thrush: This was an incidental finding on physical examination. Treating with a 7-day course of Diflucan. Now day#4.  9. Severe protein-calorie malnutrition: Patient appears underweight, and has albumin of 2.4, as well as peripheral edema, consistent with malnutrition. Have requested nutritionist consultation.  10. Hypokalemia/Hypomagnesemia: Repleting as indicated.  11. CAP: CXR of 06/05/12, demonstrated left pleural effusion and airspace disease is compatible with Pneumonia. Patient has no cough, and is already on broad spectrum antibiotics, as described above. Added oral Azithromycin on 06/06/12, to cover atypicals.      Code Status: Full Code.  Family Communication:  Disposition Plan: To be determined.    Brief narrative:  71 y.o. male with history of Neuropathy, Hypertension, Hepatitis C and chronic alcoholism, presenting with intermittent bilateral lower extremity swelling, much worse in right leg over the past 2 days with broken skin and oozing fluid, but no fever, chills or dyspnea. He has lost a lot of weight and has no appetite.  Consultants:  N/A.  Procedures:  Venous dopplers  Antibiotics:  Vancomycin started 06/04/12.   Zosyn started 06/05/12.  Azithromycin 06/06/12>>>  HPI/Subjective: Feels better.  Objective: Vital signs in last 24 hours: Temp:  [97.9 F (36.6 C)-99.3 F (37.4 C)] 97.9 F (36.6 C) (09/04  0546) Pulse Rate:  [84-94] 90  (09/04 0546) Resp:  [11-25] 20  (09/04 0546) BP: (85-123)/(60-86) 110/70 mmHg (09/04  0546) SpO2:  [95 %-100 %] 99 % (09/04 0546) Weight change:  Last BM Date: 06/07/12  Intake/Output from previous day: 09/03 0701 - 09/04 0700 In: 240 [P.O.:240] Out: 250 [Urine:250]     Physical Exam: General: Comfortable, alert, communicative, fully oriented, not short of breath at rest.  HEENT:  No clinical pallor, no jaundice, no conjunctival injection or discharge. Hydration appear fair. Patient has oral thrush.  NECK:  Supple, JVP not seen, no carotid bruits, no palpable lymphadenopathy, no palpable goiter. CHEST:  Clinically clear to auscultation, no wheezes, no crackles. HEART:  Sounds 1 and 2 heard, normal, regular, no murmurs. ABDOMEN:  Full, soft, no scars, non-tender, no palpable organomegaly, no palpable masses, normal bowel sounds. No clinical ascites.  GENITALIA:  Not examined. LOWER EXTREMITIES:  Moderate pitting edema, palpable peripheral pulses, RLE swelling is less and excoriated/denuded skin, is less erythematous.  MUSCULOSKELETAL SYSTEM:  Generalized osteoarthritic changes, otherwise, normal. CENTRAL NERVOUS SYSTEM:  No focal neurologic deficit on gross examination.  Lab Results:  Basename 06/08/12 0500 06/07/12 0835  WBC 4.9 4.5  HGB 10.4* 10.4*  HCT 29.5* 29.2*  PLT 122* 129*    Basename 06/08/12 0500 06/07/12 1753 06/07/12 0835  NA 140 -- 138  K 4.3 3.9 --  CL 109 -- 104  CO2 25 -- 25  GLUCOSE 96 -- 84  BUN 6 -- 5*  CREATININE 0.68 -- 0.70  CALCIUM 7.8* -- 7.6*   Recent Results (from the past 240 hour(s))  URINE CULTURE     Status: Normal   Collection Time   06/04/12  9:14 PM      Component Value Range Status Comment   Specimen Description URINE, CLEAN CATCH   Final    Special Requests NONE   Final    Culture  Setup Time 06/05/2012 03:24   Final    Colony Count 10,000 COLONIES/ML   Final    Culture     Final    Value: Multiple bacterial morphotypes present, none predominant. Suggest appropriate recollection if clinically indicated.   Report  Status 06/06/2012 FINAL   Final   CULTURE, BLOOD (ROUTINE X 2)     Status: Normal (Preliminary result)   Collection Time   06/05/12  9:50 AM      Component Value Range Status Comment   Specimen Description BLOOD ARM LEFT   Final    Special Requests BOTTLES DRAWN AEROBIC ONLY 5CC   Final    Culture  Setup Time 06/05/2012 15:02   Final    Culture     Final    Value:        BLOOD CULTURE RECEIVED NO GROWTH TO DATE CULTURE WILL BE HELD FOR 5 DAYS BEFORE ISSUING A FINAL NEGATIVE REPORT   Report Status PENDING   Incomplete      Studies/Results: US Abdomen Complete  06/06/2012  *RADIOLOGY REPORT*  Clinical Data:  Abdominal pain  COMPLETE ABDOMINAL ULTRASOUND  Comparison:  CT 06/05/2012  Findings:  Gallbladder:  3 mm nonshadowing calculus.  No gallbladder wall thickening.  There is a small amount of pericholecystic fluid.  Common bile duct:  10 mm diameter without evident choledocholithiasis.  Liver:  Nodular contour without focal lesion or intrahepatic biliary ductal dilatation.  IVC:  Appears normal.  Pancreas:  Unremarkable, segments obscured by overlying bowel gas.  Spleen:  6.9 cm craniocaudal length, unremarkable.  Right Kidney:  10.2 cm. No hydronephrosis.  Well-preserved cortex. Normal size and parenchymal echotexture without focal abnormalities.  Left Kidney:  9.9 cm. No hydronephrosis.  Well-preserved cortex. Normal size and parenchymal echotexture without focal abnormalities.  Abdominal aorta:  Unremarkable, segments obscured by overlying bowel gas.  Note made of a small amount of perihepatic and perisplenic ascites.  IMPRESSION:  1.  Cholelithiasis without other ultrasound evidence of cholecystitis. 2.  Small amount of perihepatic and perisplenic ascites. 3.  Nodular liver contour suggesting cirrhosis.   Original Report Authenticated By: Osa Craver, M.D.    Dg Chest Port 1 View  06/06/2012  *RADIOLOGY REPORT*  Clinical Data: 71 year old male line placement.  PORTABLE CHEST - 1 VIEW   Comparison: 06/05/2012 and earlier.  Findings: Portable upright AP view 1854 hours.  Right side PICC line placed.  Tip at the level of the lower SVC.   Cardiac size and mediastinal contours are within normal limits. Visualized tracheal air column is within normal limits.  No pneumothorax or pulmonary edema.  Continued basilar predominant patchy opacity.  No large effusion.  Chronic left fourth rib fracture.  IMPRESSION: 1.  Right PICC line, tip at the lower SVC level. 2.  Continued bibasilar opacity.   Original Report Authenticated By: Harley Hallmark, M.D.     Medications: Scheduled Meds:    . azithromycin  250 mg Oral Daily  . docusate sodium  100 mg Oral BID  . feeding supplement  237 mL Oral BID BM  . fluconazole  100 mg Oral Daily  . folic acid  1 mg Oral Daily  . furosemide  20 mg Oral BID  . levothyroxine  50 mcg Oral QAC breakfast  . LORazepam  0-4 mg Oral Q12H  . magnesium sulfate 1 - 4 g bolus IVPB  2 g Intravenous Once  . multivitamin with minerals  1 tablet Oral Daily  . pantoprazole  40 mg Oral Q1200  . piperacillin-tazobactam (ZOSYN)  IV  3.375 g Intravenous Q8H  . potassium chloride  40 mEq Oral Daily  . potassium chloride  40 mEq Oral Once   Followed by  . potassium chloride  40 mEq Oral Once  . sodium chloride  3 mL Intravenous Q12H  . sodium chloride  3 mL Intravenous Q12H  . spironolactone  12.5 mg Oral Daily  . thiamine  100 mg Oral Daily   Or  . thiamine  100 mg Intravenous Daily  . traZODone  100 mg Oral QHS  . vancomycin  750 mg Intravenous Q12H   Continuous Infusions:    . heparin 1,450 Units/hr (06/07/12 2245)  . DISCONTD: heparin 1,250 Units/hr (06/07/12 1259)   PRN Meds:.sodium chloride, albuterol, alum & mag hydroxide-simeth, guaiFENesin-dextromethorphan, LORazepam, LORazepam, morphine injection, ondansetron (ZOFRAN) IV, ondansetron, sodium chloride, sodium chloride, traMADol, DISCONTD: butamben-tetracaine-benzocaine, DISCONTD: diphenhydrAMINE,  DISCONTD: fentaNYL, DISCONTD: midazolam    LOS: 4 days   [facility]  [facility] Pager [phone]. If 8PM-8AM, please contact night-coverage at [email or web address], password [facility] 06/08/2012, 8:53 AM  LOS: 4 days

## 2012-06-08 NOTE — Progress Notes (Signed)
CONSULT NOTE - Follow Up Consult  Pharmacy Consult for heparin, vancomycin, zosyn Indication: DVT, cellulitis  Labs:  Basename 06/08/12 1215 06/08/12 0600 06/08/12 0500 06/07/12 2129 06/07/12 0835 06/06/12 2150 06/06/12 0547  HGB -- -- 10.4* -- 10.4* -- --  HCT -- -- 29.5* -- 29.2* 22.4* --  PLT -- -- 122* -- 129* 133* --  APTT -- -- -- -- -- -- --  LABPROT -- -- -- -- -- -- --  INR -- -- -- -- -- -- --  HEPARINUNFRC 0.34 0.41 -- 0.17* -- -- --  CREATININE -- -- 0.68 -- 0.70 -- 0.70  CKTOTAL -- -- -- -- -- -- --  CKMB -- -- -- -- -- -- --  TROPONINI -- -- -- -- -- -- --    Assessment/Plan:  71yo male remains therapeutic on heparin for DVT.  Will continue gtt at current rate.  Continues on vanc and zosyn for cellulitis.  BC ngtd.  Will check vanc trough prior to dose tomorrow am.    Woodfin Ganja PharmD  06/08/2012,1:22 PM

## 2012-06-09 LAB — CBC
HCT: 28.7 % — ABNORMAL LOW (ref 39.0–52.0)
MCH: 32.2 pg (ref 26.0–34.0)
MCV: 91.4 fL (ref 78.0–100.0)
RBC: 3.14 MIL/uL — ABNORMAL LOW (ref 4.22–5.81)
WBC: 3.6 10*3/uL — ABNORMAL LOW (ref 4.0–10.5)

## 2012-06-09 MED ORDER — WARFARIN VIDEO
Freq: Once | Status: AC
  Administered 2012-06-09: 13:00:00

## 2012-06-09 MED ORDER — WARFARIN - PHARMACIST DOSING INPATIENT: Freq: Every day | Status: DC

## 2012-06-09 MED ORDER — MOXIFLOXACIN HCL 400 MG PO TABS
400.0000 mg | ORAL_TABLET | Freq: Every day | ORAL | Status: DC
  Administered 2012-06-09 – 2012-06-10 (×2): 400 mg via ORAL
  Filled 2012-06-09 (×3): qty 1

## 2012-06-09 MED ORDER — WARFARIN SODIUM 5 MG PO TABS
5.0000 mg | ORAL_TABLET | Freq: Once | ORAL | Status: AC
  Administered 2012-06-09: 5 mg via ORAL
  Filled 2012-06-09: qty 1

## 2012-06-09 MED ORDER — COUMADIN BOOK
Freq: Once | Status: AC
  Administered 2012-06-09: 13:00:00
  Filled 2012-06-09: qty 1

## 2012-06-09 NOTE — Progress Notes (Signed)
[facility] PROGRESS NOTE  Tyrrell Stephens NWG:[id number] DOB: December 23, 1940 DOA: 06/04/2012 PCP: Pcp Not In System  Assessment/Plan: Active Problems:  Cellulitis  Alcohol abuse  Weight loss  Hepatitis C  Cirrhosis  Hematuria  Oral thrush  DVT of lower limb, acute  Anemia  Nonspecific (abnormal) findings on radiological and other examination of gastrointestinal tract  Gastritis  CAP (community acquired pneumonia)    1. RLE Cellulitis: Patient presented with increased swelling, pain and redness of RLE, in the setting of severe lower extremity edema, likely secondary to hypoalbuminemia. Completed 5  Days of IV Vancomycin and IV zosyn.  Inflammatory phenomena have improved, wbc is normal, and he has had no pyrexia so far. We will change the antibiotics to po avelox to cover cellulitis, and the pneumonia.   2. RLE DVT: Given disparity in circumference of lower extremities, Venous doppler was done on 06/05/12, and revealed an acute DVT noted in the right femoral and popliteal veins. He has been commenced on iv Heparin infusion, from 06/05/12. As no contraindication to coumadin from GI perspective, will start him on coumadin, with assistance from the pharmacy.   3. Alcohol abuse: Patient is a chronic alcoholic, and has expressed interest in quitting. He has been placed on CIWA protocol, vitamin supplements and counseled. So far, he has no clinical features of alcohol withdrawal.   4. Weight loss/Liver cirrhosis: Patient has lost about 20 pounds in the last one month. He has a known history of chronic hepatitis C/chronic liver disease. 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  AFP tumor marker is negative at 3.5. Per patient, he has had EGD about a year ago, which was negative for varices. Dr Stan Head provided gastroenterologist consultation. Patient is s/p EGD on 06/07/12 bu Dr Rob Bunting, which revealed mild gastritis, but no evidence of portal HTN. Will start the patient on  coumadin as no contraindications to anticoagulation from GI perspective.   5. Hepatitis C: Patient is currently not being treated secondary to alcohol abuse.   6. Anemia: Patient had a hemoglobin of 13.1 at presentation, with MCV of 88.5. His hemoglobin has since dropped to 7.5 on 06/06/12. He has no hematemesis or melena, stool guaiac is negative (Done by GI on 06/06/12), and abdominal imaging did not show retroperitoneal bleed. Transfused 2 units PRBC on 06/06/12, with satisfactory bump in hemoglobin to 10.4. His hemoglobin has been stable so far around, without any evidence of bleeding despite being on IV heparin.   7. Hematuria: Urinalysis revealed microhematuria. Abdominal ultrasound has been done, and showed no urinary tract abnormalities.   8. Oral thrush: This was an incidental finding on physical examination. Treating with a 7-day course of Diflucan. Now day#4.   9. Severe protein-calorie malnutrition: Patient appears underweight, and has albumin of 2.4, as well as peripheral edema, consistent with malnutrition. Have requested nutritionist consultation with appropriate recommendations.   10. Hypokalemia/Hypomagnesemia: Repleting as indicated.   11. CAP: CXR of 06/05/12, demonstrated left pleural effusion and airspace disease is compatible with Pneumonia. Patient has no cough, and is already on broad spectrum antibiotics, as described above. We will down grade the antibiotics to avelox to cover pneumonia and cellulitis.    Code Status: Full Code.  Family Communication: none at bedside Disposition Plan: pending PT/OT evaluation.    Brief narrative:  71 y.o. male with history of Neuropathy, Hypertension, Hepatitis C and chronic alcoholism, presenting with intermittent bilateral lower extremity swelling, much worse in right leg over the past 2 days with  broken skin and oozing fluid, but no fever, chills or dyspnea. He has lost a lot of weight and has no  appetite.  Consultants:  N/A.  Procedures:  Venous dopplers: positve for DVT.   Antibiotics:  Vancomycin started 06/04/12. -06/09/12  Zosyn started 06/05/12. -06/09/12  Azithromycin 06/06/12>>>-06/09/12  AVELOX -06/09/12  HPI/Subjective: Feels better. NO NEW COMPLAINTS.   Objective: Vital signs in last 24 hours: Temp:  [98 F (36.7 C)-98.6 F (37 C)] 98.6 F (37 C) (09/05 0612) Pulse Rate:  [86-103] 86  (09/05 0612) Resp:  [18-20] 18  (09/05 0612) BP: (95-100)/(60-72) 95/66 mmHg (09/05 0612) SpO2:  [97 %-99 %] 98 % (09/05 0612) Weight change:  Last BM Date: 06/07/12  Intake/Output from previous day: 09/04 0701 - 09/05 0700 In: 450 [IV Piggyback:450] Out: 200 [Urine:200] Total I/O In: 240 [P.O.:240] Out: -    Physical Exam: General: Comfortable, alert, communicative, fully oriented, not short of breath at rest.  HEENT:  No clinical pallor, no jaundice, no conjunctival injection or discharge. Hydration appear fair. Patient has oral thrush.  NECK:  Supple, JVP not seen, no carotid bruits, no palpable lymphadenopathy, no palpable goiter. CHEST:  Clinically clear to auscultation, no wheezes, no crackles. HEART:  Sounds 1 and 2 heard, normal, regular, no murmurs. ABDOMEN:  Full, soft, no scars, non-tender, no palpable organomegaly, no palpable masses, normal bowel sounds. No clinical ascites.  GENITALIA:  Not examined. LOWER EXTREMITIES:  Moderate pitting edema, palpable peripheral pulses, RLE swelling is less and excoriated/denuded skin, is less erythematous.  MUSCULOSKELETAL SYSTEM:  Generalized osteoarthritic changes, otherwise, normal. CENTRAL NERVOUS SYSTEM:  No focal neurologic deficit on gross examination.  Lab Results:  Basename 06/09/12 0537 06/08/12 0500  WBC 3.6* 4.9  HGB 10.1* 10.4*  HCT 28.7* 29.5*  PLT 135* 122*    Basename 06/08/12 0500 06/07/12 1753 06/07/12 0835  NA 140 -- 138  K 4.3 3.9 --  CL 109 -- 104  CO2 25 -- 25  GLUCOSE 96 -- 84  BUN 6 --  5*  CREATININE 0.68 -- 0.70  CALCIUM 7.8* -- 7.6*   Recent Results (from the past 240 hour(s))  URINE CULTURE     Status: Normal   Collection Time   06/04/12  9:14 PM      Component Value Range Status Comment   Specimen Description URINE, CLEAN CATCH   Final    Special Requests NONE   Final    Culture  Setup Time 06/05/2012 03:24   Final    Colony Count 10,000 COLONIES/ML   Final    Culture     Final    Value: Multiple bacterial morphotypes present, none predominant. Suggest appropriate recollection if clinically indicated.   Report Status 06/06/2012 FINAL   Final   CULTURE, BLOOD (ROUTINE X 2)     Status: Normal (Preliminary result)   Collection Time   06/05/12  9:50 AM      Component Value Range Status Comment   Specimen Description BLOOD ARM LEFT   Final    Special Requests BOTTLES DRAWN AEROBIC ONLY 5CC   Final    Culture  Setup Time 06/05/2012 15:02   Final    Culture     Final    Value:        BLOOD CULTURE RECEIVED NO GROWTH TO DATE CULTURE WILL BE HELD FOR 5 DAYS BEFORE ISSUING A FINAL NEGATIVE REPORT   Report Status PENDING   Incomplete      Studies/Results: No results found.  Medications: Scheduled Meds:    . azithromycin  250 mg Oral Daily  . docusate sodium  100 mg Oral BID  . feeding supplement  237 mL Oral BID BM  . fluconazole  100 mg Oral Daily  . folic acid  1 mg Oral Daily  . furosemide  20 mg Oral BID  . gabapentin  100 mg Oral TID  . levothyroxine  50 mcg Oral QAC breakfast  . LORazepam  0-4 mg Oral Q12H  . multivitamin with minerals  1 tablet Oral Daily  . pantoprazole  40 mg Oral Q1200  . piperacillin-tazobactam (ZOSYN)  IV  3.375 g Intravenous Q8H  . potassium chloride  40 mEq Oral Daily  . sodium chloride  3 mL Intravenous Q12H  . sodium chloride  3 mL Intravenous Q12H  . spironolactone  12.5 mg Oral Daily  . thiamine  100 mg Oral Daily   Or  . thiamine  100 mg Intravenous Daily  . traZODone  100 mg Oral QHS  . vancomycin  750 mg Intravenous  Q12H   Continuous Infusions:    . heparin 1,450 Units/hr (06/09/12 0051)   PRN Meds:.sodium chloride, albuterol, alum & mag hydroxide-simeth, guaiFENesin-dextromethorphan, morphine injection, ondansetron (ZOFRAN) IV, ondansetron, sodium chloride, sodium chloride, traMADol    LOS: 5 days   [facility]  [facility] Pager [phone]. If 8PM-8AM, please contact night-coverage at [email or web address], password [facility] 06/09/2012, 10:25 AM  LOS: 5 days

## 2012-06-09 NOTE — Progress Notes (Signed)
CONSULT NOTE - Follow Up Consult  Pharmacy Consult for heparin, coumadin Indication: DVT  Labs:  Basename 06/09/12 0537 06/08/12 1215 06/08/12 0600 06/08/12 0500 06/07/12 0835  HGB 10.1* -- -- 10.4* --  HCT 28.7* -- -- 29.5* 29.2*  PLT 135* -- -- 122* 129*  APTT -- -- -- -- --  LABPROT -- -- -- -- --  INR -- -- -- -- --  HEPARINUNFRC 0.41 0.34 0.41 -- --  CREATININE -- -- -- 0.68 0.70  CKTOTAL -- -- -- -- --  CKMB -- -- -- -- --  TROPONINI -- -- -- -- --    Assessment/Plan:  71yo male remains therapeutic on heparin for DVT.  Will continue gtt at current rate.  Coumadin to start today.Day1/5 overlap  His baseline INR is elevated due to liver disease so will give 5 mg today and proceed cautiously.Vanc and zosyn changed to avelox for cellulitis.  BC ngtd.     Woodfin Ganja PharmD  06/09/2012,12:31 PM

## 2012-06-09 NOTE — Evaluation (Deleted)
Physical Therapy Evaluation Patient Details Name: Nathan Frank MRN: [id number] DOB: 1941-09-26 Today's Date: 06/09/2012 Time: 4098-1191 PT Time Calculation (min): 17 min  PT Assessment / Plan / Recommendation Clinical Impression  Pt is a 71 y/o male who lives alone and has been caring for his 96 y/o mother who is in a SNF.  Pt admitted for cellulitis in R LE. Pt requesting Home Health Aid for safe return to home.  Pt has no caregiver at present but did report that his 'wife" who lives in [place] may be able to come done and stay with him for a while.      PT Assessment       Follow Up Recommendations  Home health PT;Supervision for mobility/OOB    Barriers to Discharge        Equipment Recommendations  3 in 1 bedside comode    Recommendations for Other Services     Frequency      Precautions / Restrictions     Pertinent Vitals/Pain 8/10 pain in R LE.  Pt premedicated prior to session.       Mobility  Bed Mobility Bed Mobility: Supine to Sit Supine to Sit: HOB flat;6: Modified independent (Device/Increase time) Details for Bed Mobility Assistance: extra time to complete task no assistance or cueing required.  Transfers Transfers: Sit to Stand;Stand to Sit Sit to Stand: 5: Supervision Stand to Sit: 5: Supervision Details for Transfer Assistance: Supervision for safety.  Pt initially having difficulty standing.  Imprroved on 2nd and 3rd trials.  Ambulation/Gait Ambulation/Gait Assistance: 5: Supervision Ambulation Distance (Feet): 25 Feet Assistive device: Rolling walker Ambulation/Gait Assistance Details: Supervision for safety.  Pt able to tolerate WB on R LE  Gait Pattern: Step-to pattern;Decreased stride length;Decreased stance time - right Stairs: No Wheelchair Mobility Wheelchair Mobility: No    Exercises     PT Diagnosis:    PT Problem List:   PT Treatment Interventions:     PT Goals Acute Rehab PT Goals PT Goal Formulation: With patient Time For  Goal Achievement: 06/16/12 Potential to Achieve Goals: Good Pt will Transfer Bed to Chair/Chair to Bed: with modified independence PT Transfer Goal: Bed to Chair/Chair to Bed - Progress: Goal set today Pt will Ambulate: 51 - 150 feet;with modified independence;with least restrictive assistive device PT Goal: Ambulate - Progress: Goal set today Pt will Go Up / Down Stairs: 3-5 stairs;with modified independence;with rail(s) PT Goal: Up/Down Stairs - Progress: Goal set today  Visit Information  Last PT Received On: 06/09/12 Assistance Needed: +1    Subjective Data      Prior Functioning  Home Living Lives With: Alone Available Help at Discharge: Family (Wife possibly coming from [place]. ) Type of Home: House Home Access: Stairs to enter Secretary/administrator of Steps: 3 Entrance Stairs-Rails: Right;Left;Can reach both Home Layout: One level Bathroom Shower/Tub: Forensic scientist: Standard Bathroom Accessibility: Yes How Accessible: Accessible via wheelchair;Accessible via walker Home Adaptive Equipment: Dan Humphreys - four wheeled;Straight cane Prior Function Level of Independence: Independent with assistive device(s) Able to Take Stairs?: Yes Driving: Yes Communication Communication: No difficulties    Cognition  Overall Cognitive Status: Appears within functional limits for tasks assessed/performed Arousal/Alertness: Awake/alert Orientation Level: Oriented X4 / Intact Behavior During Session: [facility] for tasks performed    Extremity/Trunk Assessment Right Upper Extremity Assessment RUE ROM/Strength/Tone: Within functional levels Left Upper Extremity Assessment LUE ROM/Strength/Tone: Within functional levels Right Lower Extremity Assessment RLE ROM/Strength/Tone: Unable to fully assess;Due to pain Left Lower Extremity Assessment  LLE ROM/Strength/Tone: Within functional levels Trunk Assessment Trunk Assessment: Normal   Balance Balance Balance  Assessed: No  End of Session PT - End of Session Equipment Utilized During Treatment: Gait belt Activity Tolerance: Patient tolerated treatment well Patient left: in chair;with call bell/phone within reach Nurse Communication: Mobility status  GP     Effie Wahlert 06/09/2012, 11:08 AM Theron Arista L. Vihaan Gloss DPT [phone]

## 2012-06-09 NOTE — Evaluation (Signed)
Physical Therapy Evaluation Patient Details Name: Nathan Frank MRN: [id number] DOB: Jul 07, 1941 Today's Date: 06/09/2012 Time: 4098-1191 PT Time Calculation (min): 17 min  PT Assessment / Plan / Recommendation Clinical Impression  Pt is a 71 y/o male who lives alone and has been caring for his 22 y/o mother who is in a SNF.  Pt admitted for cellulitis in R LE. Pt requesting Home Health Aid for safe return to home.  Pt has no caregiver at present but did report that his 'wife" who lives in [place] may be able to come done and stay with him for a while.      PT Assessment       Follow Up Recommendations  Home health PT;Supervision for mobility/OOB;Other (comment) (Home health aid. )    Barriers to Discharge        Equipment Recommendations  3 in 1 bedside comode    Recommendations for Other Services OT consult;Other (comment)   Frequency      Precautions / Restrictions     Pertinent Vitals/Pain Pt reporting 8/10 pain in R LE.  Medicated prior to session.       Mobility  Bed Mobility Bed Mobility: Supine to Sit Supine to Sit: HOB flat;6: Modified independent (Device/Increase time) Details for Bed Mobility Assistance: extra time to complete task no assistance or cueing required.  Transfers Transfers: Sit to Stand;Stand to Sit Sit to Stand: 5: Supervision Stand to Sit: 5: Supervision Details for Transfer Assistance: Supervision for safety.  Pt initially having difficulty standing.  Imprroved on 2nd and 3rd trials.  Ambulation/Gait Ambulation/Gait Assistance: 5: Supervision Ambulation Distance (Feet): 25 Feet Assistive device: Rolling walker Ambulation/Gait Assistance Details: Supervision for safety.  Pt able to tolerate WB on R LE  Gait Pattern: Step-to pattern;Decreased stride length;Decreased stance time - right Stairs: No Wheelchair Mobility Wheelchair Mobility: No    Exercises     PT Diagnosis:    PT Problem List:   PT Treatment Interventions:     PT  Goals Acute Rehab PT Goals PT Goal Formulation: With patient Time For Goal Achievement: 06/16/12 Potential to Achieve Goals: Good Pt will Transfer Bed to Chair/Chair to Bed: with modified independence PT Transfer Goal: Bed to Chair/Chair to Bed - Progress: Goal set today Pt will Ambulate: 51 - 150 feet;with modified independence;with least restrictive assistive device PT Goal: Ambulate - Progress: Goal set today Pt will Go Up / Down Stairs: 3-5 stairs;with modified independence;with rail(s) PT Goal: Up/Down Stairs - Progress: Goal set today  Visit Information  Last PT Received On: 06/09/12 Assistance Needed: +1    Subjective Data      Prior Functioning  Home Living Lives With: Alone Available Help at Discharge: Family (Wife possibly coming from [place]. ) Type of Home: House Home Access: Stairs to enter Secretary/administrator of Steps: 3 Entrance Stairs-Rails: Right;Left;Can reach both Home Layout: One level Bathroom Shower/Tub: Forensic scientist: Standard Bathroom Accessibility: Yes How Accessible: Accessible via wheelchair;Accessible via walker Home Adaptive Equipment: Dan Humphreys - four wheeled;Straight cane Prior Function Level of Independence: Independent with assistive device(s) Able to Take Stairs?: Yes Driving: Yes Communication Communication: No difficulties    Cognition  Overall Cognitive Status: Appears within functional limits for tasks assessed/performed Arousal/Alertness: Awake/alert Orientation Level: Oriented X4 / Intact Behavior During Session: [facility] for tasks performed    Extremity/Trunk Assessment Right Upper Extremity Assessment RUE ROM/Strength/Tone: Within functional levels Left Upper Extremity Assessment LUE ROM/Strength/Tone: Within functional levels Right Lower Extremity Assessment RLE ROM/Strength/Tone: Unable to fully  assess;Due to pain Left Lower Extremity Assessment LLE ROM/Strength/Tone: Within functional  levels Trunk Assessment Trunk Assessment: Normal   Balance Balance Balance Assessed: No  End of Session PT - End of Session Equipment Utilized During Treatment: Gait belt Activity Tolerance: Patient tolerated treatment well Patient left: in chair;with call bell/phone within reach Nurse Communication: Mobility status  GP     Nathan Frank 06/09/2012, 11:11 AM Nathan Frank DPT [phone]

## 2012-06-10 LAB — CBC
HCT: 29.3 % — ABNORMAL LOW (ref 39.0–52.0)
Hemoglobin: 10.3 g/dL — ABNORMAL LOW (ref 13.0–17.0)
MCH: 32.2 pg (ref 26.0–34.0)
MCV: 91.6 fL (ref 78.0–100.0)
RBC: 3.2 MIL/uL — ABNORMAL LOW (ref 4.22–5.81)

## 2012-06-10 LAB — URINALYSIS, ROUTINE W REFLEX MICROSCOPIC
Bilirubin Urine: NEGATIVE
Nitrite: NEGATIVE
Protein, ur: NEGATIVE mg/dL
Urobilinogen, UA: 2 mg/dL — ABNORMAL HIGH (ref 0.0–1.0)

## 2012-06-10 MED ORDER — MOXIFLOXACIN HCL 400 MG PO TABS: 400.0000 mg | ORAL_TABLET | Freq: Every day | ORAL | Status: AC

## 2012-06-10 MED ORDER — ENSURE COMPLETE PO LIQD: 237.0000 mL | Freq: Two times a day (BID) | ORAL | Status: DC

## 2012-06-10 MED ORDER — ALUM & MAG HYDROXIDE-SIMETH 200-200-20 MG/5ML PO SUSP: 30.0000 mL | Freq: Four times a day (QID) | ORAL | Status: AC | PRN

## 2012-06-10 MED ORDER — SPIRONOLACTONE 12.5 MG HALF TABLET: 12.5000 mg | ORAL_TABLET | Freq: Every day | ORAL | Status: DC

## 2012-06-10 MED ORDER — WARFARIN SODIUM 5 MG PO TABS
5.0000 mg | ORAL_TABLET | Freq: Once | ORAL | Status: AC
  Administered 2012-06-10: 5 mg via ORAL
  Filled 2012-06-10: qty 1

## 2012-06-10 MED ORDER — THIAMINE HCL 100 MG PO TABS: 100.0000 mg | ORAL_TABLET | Freq: Every day | ORAL | Status: AC

## 2012-06-10 MED ORDER — POTASSIUM CHLORIDE CRYS ER 20 MEQ PO TBCR: 20.0000 meq | EXTENDED_RELEASE_TABLET | Freq: Every day | ORAL | Status: DC

## 2012-06-10 MED ORDER — FOLIC ACID 1 MG PO TABS: 1.0000 mg | ORAL_TABLET | Freq: Every day | ORAL | Status: AC

## 2012-06-10 MED ORDER — GABAPENTIN 100 MG PO CAPS: 100.0000 mg | ORAL_CAPSULE | Freq: Three times a day (TID) | ORAL | Status: DC

## 2012-06-10 MED ORDER — DSS 100 MG PO CAPS: 100.0000 mg | ORAL_CAPSULE | Freq: Two times a day (BID) | ORAL | Status: AC

## 2012-06-10 MED ORDER — ENOXAPARIN SODIUM 80 MG/0.8ML ~~LOC~~ SOLN
70.0000 mg | Freq: Two times a day (BID) | SUBCUTANEOUS | Status: DC
  Administered 2012-06-10: 14:00:00 via SUBCUTANEOUS
  Administered 2012-06-11 (×2): 70 mg via SUBCUTANEOUS
  Filled 2012-06-10 (×6): qty 0.8

## 2012-06-10 MED ORDER — ADULT MULTIVITAMIN W/MINERALS CH: 1.0000 | ORAL_TABLET | Freq: Every day | ORAL | Status: DC

## 2012-06-10 MED ORDER — WARFARIN SODIUM 5 MG PO TABS: 5.0000 mg | ORAL_TABLET | Freq: Once | ORAL | Status: DC

## 2012-06-10 MED ORDER — FLUCONAZOLE 100 MG PO TABS: 100.0000 mg | ORAL_TABLET | Freq: Every day | ORAL | Status: AC

## 2012-06-10 MED ORDER — ENOXAPARIN SODIUM 80 MG/0.8ML ~~LOC~~ SOLN: 70.0000 mg | Freq: Two times a day (BID) | SUBCUTANEOUS | Status: DC

## 2012-06-10 NOTE — Progress Notes (Signed)
Per Dr. Blake Divine, patient's d/c will be delayed today due to a bleeding issue from his penis. She will need to reevaluate in the a.m.  Notified SNF and they will accept this weekend if d/c is ordered. Will ask weekend CSW to folllow up. Patient and nursing are aware of d/c delay.  Lorri Frederick. West Pugh  [phone]

## 2012-06-10 NOTE — Progress Notes (Signed)
PT CONTACT NOTE:  06/10/2012  Pt would benefit from short term rehabilitation in SNF to maximize functional mobility and pt safety. Pt present with significant LE weakness and presents as significant falls risk.  Nathan Frank L. Grayce Budden DPT [phone]

## 2012-06-10 NOTE — Progress Notes (Addendum)
CONSULT NOTE - Follow Up Consult  Pharmacy Consult for heparin, coumadin Indication: DVT  Labs:  Basename 06/10/12 0550 06/09/12 0537 06/08/12 1215 06/08/12 0500  HGB 10.3* 10.1* -- --  HCT 29.3* 28.7* -- 29.5*  PLT 137* 135* -- 122*  APTT -- -- -- --  LABPROT 16.7* -- -- --  INR 1.33 -- -- --  HEPARINUNFRC 0.46 0.41 0.34 --  CREATININE -- -- -- 0.68  CKTOTAL -- -- -- --  CKMB -- -- -- --  TROPONINI -- -- -- --    Assessment/Plan:  71yo male remains therapeutic on heparin for DVT.  Will continue gtt at current rate.  Coumadin day 2/5 overlap  His baseline INR is elevated due to liver disease. INR today 1.33. Will repeat 5 mg coumadin today .    Woodfin Ganja PharmD  06/10/2012,10:36 AM  Addum:  Change heparin to lovenox 70 mg sq q12 hours.

## 2012-06-10 NOTE — Progress Notes (Signed)
Physical Therapy Treatment Patient Details Name: Jovin Fester MRN: [id number] DOB: 1941-03-04 Today's Date: 06/10/2012 Time: 4696-2952 PT Time Calculation (min): 46 min  PT Assessment / Plan / Recommendation Comments on Treatment Session  Pt continues to require assistance with mobility to prevent falls.  Spoke with pt about short term SNF placement for rehab to address generalized weakness and balance. Pt agreeable to discharge plan.  Provided pt with theraband s (yellow and Jash Wahlen) and HEP hand out.  Will continue to follow pt.      Follow Up Recommendations  Skilled nursing facility;Supervision/Assistance - 24 hour    Barriers to Discharge        Equipment Recommendations  Defer to next venue    Recommendations for Other Services OT consult;Other (comment)  Frequency Min 3X/week   Plan Discharge plan needs to be updated;Frequency remains appropriate    Precautions / Restrictions Precautions Precautions: None Restrictions Weight Bearing Restrictions: No   Pertinent Vitals/Pain Pt reporting pain at 7/10 in R LE. Pt medicated prior to session.  Pain worse in standing.      Mobility  Bed Mobility Bed Mobility: Supine to Sit Supine to Sit: 6: Modified independent (Device/Increase time) Transfers Transfers: Sit to Stand;Stand to Sit Sit to Stand: 5: Supervision Stand to Sit: 5: Supervision Details for Transfer Assistance: Supervision for safety.  Pt initially having difficulty standing.  Imprroved on 2nd and 3rd trials.  Ambulation/Gait Ambulation/Gait Assistance: 4: Min assist;4: Min Government social research officer (Feet): 100 Feet Assistive device: Rolling walker Ambulation/Gait Assistance Details: Supervision for safety, repeated cueing for proper use of RW.  Pt with 2 LOB episodes while changing direction. Required min assist to recover balance.   Gait Pattern: Step-to pattern;Decreased stride length;Decreased stance time - right Gait velocity: 1.4 ft/sec Stairs: No Wheelchair  Mobility Wheelchair Mobility: No    Exercises General Exercises - Upper Extremity Shoulder Horizontal ABduction: Both;5 reps;Seated;Theraband Theraband Level (Shoulder Horizontal Abduction): Level 1 (Yellow) General Exercises - Lower Extremity Mini-Sqauts: Strengthening;Both;10 reps;Standing Shoulder Exercises Shoulder Flexion: Strengthening;10 reps;Seated;Theraband Theraband Level (Shoulder Flexion): Level 1 (Yellow) Shoulder ABduction: 5 reps;Both;Strengthening;Seated;Theraband Elbow Flexion: 10 reps;Both;Strengthening;Seated;Theraband Theraband Level (Elbow Flexion): Level 1 (Yellow) Elbow Extension: 5 reps;Both;Seated;Theraband Theraband Level (Elbow Extension): Level 1 (Yellow)   PT Diagnosis:    PT Problem List:   PT Treatment Interventions:     PT Goals Acute Rehab PT Goals PT Goal Formulation: With patient Time For Goal Achievement: 06/16/12 Potential to Achieve Goals: Good Pt will Transfer Bed to Chair/Chair to Bed: with modified independence PT Transfer Goal: Bed to Chair/Chair to Bed - Progress: Progressing toward goal Pt will Ambulate: 51 - 150 feet;with modified independence;with least restrictive assistive device PT Goal: Ambulate - Progress: Progressing toward goal Pt will Go Up / Down Stairs: 3-5 stairs;with modified independence;with rail(s) Pt will Perform Home Exercise Program: Independently PT Goal: Perform Home Exercise Program - Progress: Goal set today Additional Goals Additional Goal #1: Perform standardized balance test within safe limits.  PT Goal: Additional Goal #1 - Progress: Goal set today  Visit Information  Last PT Received On: 06/10/12 Assistance Needed: +1    Subjective Data  Subjective: I feel better Patient Stated Goal: return to home   Cognition  Overall Cognitive Status: Appears within functional limits for tasks assessed/performed Arousal/Alertness: Awake/alert Orientation Level: Oriented X4 / Intact Behavior During Session: [facility]  for tasks performed    Balance  Balance Balance Assessed: Yes High Level Balance High Level Balance Activites: Side stepping;Backward walking;Direction changes;Turns High Level Balance Comments:  LOB requiring min assist to correct with side stepping, backward walking, and change in direction.    End of Session PT - End of Session Equipment Utilized During Treatment: Gait belt Activity Tolerance: Patient tolerated treatment well Patient left: in chair;with call bell/phone within reach Nurse Communication: Mobility status   GP     Shed Nixon 06/10/2012, 12:32 PM Nyazia Canevari L. Barbee Mamula DPT [phone]

## 2012-06-10 NOTE — Clinical Social Work Psychosocial (Addendum)
    Clinical Social Work Department BRIEF PSYCHOSOCIAL ASSESSMENT 06/10/2012  Patient:  Nathan Frank,Nathan Frank     Account Number:  [id number]     Admit date:  06/04/2012  Clinical Social Worker:  Burnard Hawthorne  Date/Time:  06/09/2012 02:30 PM  Referred by:  Physician  Date Referred:  06/09/2012 Referred for  SNF Placement   Other Referral:   Interview type:  Patient Other interview type:    PSYCHOSOCIAL DATA Living Status:  ALONE Admitted from facility:   Level of care:   Primary support name:  Trellis Moment Primary support relationship to patient:  SIBLING Degree of support available:   Very strong support    CURRENT CONCERNS Current Concerns  Post-Acute Placement   Other Concerns:    SOCIAL WORK ASSESSMENT / PLAN Met with paient- he had planned to return home but had to reconsider after MD related that he would require Lovenox injects for several weeks. Patient lives alone and states that he does not have anyone to help him with this. He also states he feels week and would benefit from the therapy. Bed search process discussed; patient stated that his mother is currently at [facility] and he woud like ot go there if possible.  If not-  he woudl accept whatever bed is available.   Assessment/plan status:  Psychosocial Support/Ongoing Assessment of Needs Other assessment/ plan:   Information/referral to community resources:   SNF Bed list    PATIENT'S/FAMILY'S RESPONSE TO PLAN OF CARE: Patient is alert, oriented and very pleasant. He is wanting to seek short term SNF.  He stated that he will return to his home after rehab and that his sister woudl be supportive.

## 2012-06-10 NOTE — Clinical Social Work Placement (Addendum)
    Clinical Social Work Department CLINICAL SOCIAL WORK PLACEMENT NOTE 06/10/2012  Patient:  Nathan Frank,Nathan Frank  Account Number:  [id number] Admit date:  06/04/2012  Clinical Social Worker:  Lupita Leash Zainah Steven, BSW  Date/time:  06/09/2012 02:30 PM  Clinical Social Work is seeking post-discharge placement for this patient at the following level of care:   SKILLED NURSING   (*CSW will update this form in Epic as items are completed)   06/09/2012  Patient/family provided with [facility] Department of Clinical Social Work's list of facilities offering this level of care within the geographic area requested by the patient (or if unable, by the patient's family).  06/09/2012  Patient/family informed of their freedom to choose among providers that offer the needed level of care, that participate in Medicare, Medicaid or managed care program needed by the patient, have an available bed and are willing to accept the patient.  06/09/2012  Patient/family informed of [facility]' ownership interest in [facility], as well as of the fact that they are under no obligation to receive care at this facility.  PASARR submitted to [employer] on 06/10/2012 PASARR number received from [employer] on 06/10/2012  FL2 transmitted to all facilities in geographic area requested by pt/family on  06/10/2012 FL2 transmitted to all facilities within larger geographic area on   Patient informed that his/her managed care company has contracts with or will negotiate with  certain facilities, including the following:   [name]     Patient/family informed of bed offers received:  06/10/12 Patient chooses bed at [facility] Physician recommends and patient chooses bed at    Patient to be transferred to [facility]  on  06/11/12 (JB) Patient to be transferred to facility by Ambulance  The following physician request were entered in Epic:   Additional Comments: 06/10/12. Patient was scheduled for d/c to SNF today but d/c delayed due to  bleeding issues.  MD will reconsider tomorrow. SNF will accept when medically stable.  Weekend CSW will follow up.

## 2012-06-10 NOTE — Discharge Summary (Addendum)
Physician Discharge Summary  Juwon Scripter ZOX:[id number] DOB: 1941/04/10 DOA: 06/04/2012  PCP: Pcp Not In System  Admit date: 06/04/2012 Discharge date: 06/10/2012  Recommendations for Outpatient Follow-up:  1. Follow up with P[facility]/ [facility] for INR check after he is discharged from the rehabilitation. 2. Will need referral to [facility] for INR CHECK  After he is discharged from rehab.  3. Follow up with Gastroenterology on discharge in 4 weeks  4. Check cbc on Monday and INR tomorrow and dose coumadin . 5. Please stop the lovenox after the INR is therapeutic and continue with coumadin to keep INR between 2 to 3.  Discharge Diagnoses:  Active Problems:  Cellulitis  Alcohol abuse  Weight loss  Hepatitis C  Cirrhosis  Hematuria  Oral thrush  DVT of lower limb, acute  Anemia  Nonspecific (abnormal) findings on radiological and other examination of gastrointestinal tract  Gastritis  CAP (community acquired pneumonia)   Discharge Condition: stable  Diet recommendation: low sodium diet  Filed Weights   06/05/12 0010  Weight: 70.761 kg (156 lb)    History of present illness:  Presented with history of hepatitis C. with liver disease as well as alcohol abuse followed by [facility]  For the past 2 days his right leg has been swollen severely with skin broken and leaking fluid. NO fever or chills.NO shortness of breath. He has hx of hep C and has been an alcoholic for decades. HE wants to quit drinking denies hx of DT's. He notes easy bruising. NO blood in stools no hematemesis. HE had EGD in the past negative for varices per patient. He have had leg swelling off and on for long time. Reports at some point he used to be on spironolactone but not sure if he is now.  HE has been losing a lot of weight and has no appetite. His last servalence [place] has been a year ago.    Hospital Course:  1. RLE Cellulitis: Patient presented with increased swelling, pain and redness of  RLE, in the setting of severe lower extremity edema, likely secondary to hypoalbuminemia. Completed 5 Days of IV Vancomycin and IV zosyn. Inflammatory phenomena have improved, wbc is normal, and he has had no pyrexia so far. We will change the antibiotics to po avelox to cover cellulitis, and the pneumonia.  2. RLE DVT: Given disparity in circumference of lower extremities, Venous doppler was done on 06/05/12, and revealed an acute DVT noted in the right femoral and popliteal veins. He has been commenced on iv Heparin infusion, from 06/05/12.  We have changed it to lovenox for few more days till the INR is therapeutic. As no contraindication to coumadin from GI perspective, will start him on coumadin, with assistance from the pharmacy.   3. Alcohol abuse: Patient is a chronic alcoholic, and has expressed interest in quitting. He has been placed on CIWA protocol, vitamin supplements and counseled. So far, he has no clinical features of alcohol withdrawal.   4. Weight loss/Liver cirrhosis: Patient has lost about 20 pounds in the last one month. He has a known history of chronic hepatitis C/chronic liver disease. 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 AFP tumor marker is negative at 3.5. Per patient, he has had EGD about a year ago, which was negative for varices. Dr Stan Head provided gastroenterologist consultation. Patient is s/p EGD on 06/07/12 bu Dr Rob Bunting, which revealed mild gastritis, but no evidence of portal HTN.  Will start  the patient on coumadin as no contraindications to anticoagulation from GI perspective.   5. Hepatitis C: Patient is currently not being treated secondary to alcohol abuse.   6. Anemia: Patient had a hemoglobin of 13.1 at presentation, with MCV of 88.5. His hemoglobin has since dropped to 7.5 on 06/06/12. He has no hematemesis or melena, stool guaiac is negative (Done by GI on 06/06/12), and abdominal imaging did not show retroperitoneal bleed. Transfused 2  units PRBC on 06/06/12, with satisfactory bump in hemoglobin to 10.4. His hemoglobin has been stable so far around, without any evidence of bleeding despite being on IV heparin and lovenox.  7. Hematuria: Urinalysis revealed microhematuria. Abdominal ultrasound has been done, and showed no urinary tract abnormalities.  8. Oral thrush: This was an incidental finding on physical examination. Treating with a 7-day course of Diflucan. Now day#5. 9. Severe protein-calorie malnutrition: Patient appears underweight, and has albumin of 2.4, as well as peripheral edema, consistent with malnutrition. Have requested nutritionist consultation with appropriate recommendations.   10. Hypokalemia/Hypomagnesemia: Repleting as indicated.  11. CAP: CXR of 06/05/12, demonstrated left pleural effusion and airspace disease is compatible with Pneumonia. Patient has no cough, and is already on broad spectrum antibiotics, as described above. We will down grade the antibiotics to avelox to cover pneumonia and cellulitis.      Procedures:  EGD  Consultations:  Gastroenterology consult  Discharge Exam: Filed Vitals:   06/10/12 0639  BP: 102/70  Pulse: 94  Temp: 98.1 F (36.7 C)  Resp: 16   Filed Vitals:   06/09/12 1300 06/09/12 2247 06/10/12 0035 06/10/12 0639  BP: 99/68 100/69 93/70 102/70  Pulse: 98 102 91 94  Temp: 98.1 F (36.7 C) 98 F (36.7 C) 97.7 F (36.5 C) 98.1 F (36.7 C)  TempSrc:      Resp: 20 18 18 16   Height:      Weight:      SpO2: 98% 98% 100% 96%    Physical Exam:  General: Comfortable, alert, communicative, fully oriented, not short of breath at rest.  HEENT: No clinical pallor, no jaundice, no conjunctival injection or discharge. Hydration appear fair. Patient has oral thrush.  NECK: Supple, JVP not seen, no carotid bruits, no palpable lymphadenopathy, no palpable goiter.  CHEST: Clinically clear to auscultation, no wheezes, no crackles.  HEART: Sounds 1 and 2 heard, normal,  regular, no murmurs.  ABDOMEN: Full, soft, no scars, non-tender, no palpable organomegaly, no palpable masses, normal bowel sounds. No clinical ascites.  GENITALIA: Not examined.  LOWER EXTREMITIES: Moderate pitting edema, palpable peripheral pulses, RLE swelling is less and excoriated/denuded skin, is less erythematous.  MUSCULOSKELETAL SYSTEM: Generalized osteoarthritic changes, otherwise, normal.  CENTRAL NERVOUS SYSTEM: No focal neurologic deficit on gross examination.   Discharge Instructions   Medication List  As of 06/10/2012 12:43 PM   ASK your doctor about these medications         furosemide 20 MG tablet   Commonly known as: LASIX   Take 20 mg by mouth daily.      GABAPENTIN PO   Take 1 capsule by mouth 3 (three) times daily. Pt does not know the dosage of medication. Patient uses the [place] pharmacy. Cant confirm dosage.      omeprazole 20 MG capsule   Commonly known as: PRILOSEC   Take 20 mg by mouth daily.      SYNTHROID PO   Take 1 tablet by mouth daily. Pt doesn't know dose of medication. [facility] cant confirm dosage.      traMADol 50 MG tablet   Commonly known as: ULTRAM   Take 50 mg by mouth every 6 (six) hours as needed. pain      traZODone 100 MG tablet   Commonly known as: DESYREL   Take 100 mg by mouth at bedtime.              The results of significant diagnostics from this hospitalization (including imaging, microbiology, ancillary and laboratory) are listed below for reference.    Significant Diagnostic Studies: Dg Chest 2 View  06/05/2012  *RADIOLOGY REPORT*  Clinical Data: Weight loss.  Weakness.  CHEST - 2 VIEW  Comparison: Chest and rib films 10/20/2011.  Findings: The heart is mildly enlarged. No significant edema is present to suggest failure.  Emphysematous changes are present. Left lower lobe airspace disease is concerning for pneumonia.  A left pleural effusion is present. Minimal airspace disease is present at the  right lung base.  The visualized soft tissues and bony thorax are unremarkable.  IMPRESSION:  1.  Left pleural effusion and airspace disease is compatible with pneumonia. 2.  Mild cardiomegaly without failure. 3.  Minimal atelectasis at the right lung base.   Original Report Authenticated By: Jamesetta Orleans. MATTERN, M.D.    US Abdomen Complete  06/06/2012  *RADIOLOGY REPORT*  Clinical Data:  Abdominal pain  COMPLETE ABDOMINAL ULTRASOUND  Comparison:  CT 06/05/2012  Findings:  Gallbladder:  3 mm nonshadowing calculus.  No gallbladder wall thickening.  There is a small amount of pericholecystic fluid.  Common bile duct:  10 mm diameter without evident choledocholithiasis.  Liver:  Nodular contour without focal lesion or intrahepatic biliary ductal dilatation.  IVC:  Appears normal.  Pancreas:  Unremarkable, segments obscured by overlying bowel gas.  Spleen:  6.9 cm craniocaudal length, unremarkable.  Right Kidney:  10.2 cm. No hydronephrosis.  Well-preserved cortex. Normal size and parenchymal echotexture without focal abnormalities.  Left Kidney:  9.9 cm. No hydronephrosis.  Well-preserved cortex. Normal size and parenchymal echotexture without focal abnormalities.  Abdominal aorta:  Unremarkable, segments obscured by overlying bowel gas.  Note made of a small amount of perihepatic and perisplenic ascites.  IMPRESSION:  1.  Cholelithiasis without other ultrasound evidence of cholecystitis. 2.  Small amount of perihepatic and perisplenic ascites. 3.  Nodular liver contour suggesting cirrhosis.   Original Report Authenticated By: Osa Craver, M.D.    Ct Abdomen Pelvis W Contrast  06/05/2012  *RADIOLOGY REPORT*  Clinical Data: Chronic liver disease.  Weight loss in the past month.  Generalized abdominal pain.  Hepatitis C.  Hypertension. Alcohol abuse.  Hematuria.  CT ABDOMEN AND PELVIS WITH CONTRAST  Technique:  Multidetector CT imaging of the abdomen and pelvis was performed following the standard  protocol during bolus administration of intravenous contrast.  Contrast: 80mL OMNIPAQUE IOHEXOL 300 MG/ML  SOLN, 1 OMNIPAQUE IOHEXOL 300 MG/ML  SOLN  Comparison: None.  Findings: Mild subsegmental atelectasis at the right lung base. Patchy left base air space disease.  Mild cardiomegaly.  Bilateral gynecomastia.  Small bilateral pleural effusions.  Right cardiophrenic angle adenopathy is mild and likely reactive at 9 mm on image 12.  Moderate cirrhosis.  A lateral segment left liver lobe 9 mm lesion is well-circumscribed and likely a small cyst.  Portal vein and branches patent.  Hepatic veins not well evaluated secondary to technique and bolus timing.  Normal spleen.  There are paraesophageal collaterals suspicious for varices  on image 13.  The proximal stomach is underdistended.  Mild pancreatic atrophy.  The gallbladder is mildly prominent.  Suspicion of at least one stone within.  Wall thickness of up to 7 mm.  No gross surrounding inflammatory changes.  The common duct measures 1.0 cm on coronal image 54, slightly dilated for patient age.  There is artifact in the region of the distal common duct on images 32 and 33.  This makes evaluation for choledocholithiasis difficult.  No splenic artery aneurysm.  Normal adrenal glands.  Mild renal cortical thinning bilaterally.  No retroperitoneal or retrocrural adenopathy.  Normal colon and terminal ileum.  The appendix is likely identified on image 59 and within normal limits.  Normal caliber of small bowel loops. No pneumatosis or free intraperitoneal air.  Trace perihepatic and perisplenic ascites.  No pelvic adenopathy.    Normal urinary bladder and prostate. Probable injection induced gas within the anterior abdominal wall. No acute osseous abnormality.  Marked lower lumbar spondylosis.  IMPRESSION:  1.  Moderate cirrhosis and portal venous hypertension. 2.  Gallbladder wall thickening and probable stone or stones. Nonspecific in the setting of cirrhosis/portal  venous hypertension. Could be secondary.  However, acute cholecystitis cannot be excluded.  If there are right upper quadrant symptoms, consider dedicated ultrasound. 3.  Small bilateral pleural effusions with left base pneumonia. 4.  Bilateral gynecomastia. 5.  Mild biliary ductal dilatation.  Artifact degradation in the region of the distal common duct.  Cannot exclude choledocholithiasis. If further imaging characterization is desired, consider MRCP.   Original Report Authenticated By: Consuello Bossier, M.D.    Dg Chest Port 1 View  06/06/2012  *RADIOLOGY REPORT*  Clinical Data: 71 year old male line placement.  PORTABLE CHEST - 1 VIEW  Comparison: 06/05/2012 and earlier.  Findings: Portable upright AP view 1854 hours.  Right side PICC line placed.  Tip at the level of the lower SVC.   Cardiac size and mediastinal contours are within normal limits. Visualized tracheal air column is within normal limits.  No pneumothorax or pulmonary edema.  Continued basilar predominant patchy opacity.  No large effusion.  Chronic left fourth rib fracture.  IMPRESSION: 1.  Right PICC line, tip at the lower SVC level. 2.  Continued bibasilar opacity.   Original Report Authenticated By: Harley Hallmark, M.D.     Microbiology: Recent Results (from the past 240 hour(s))  URINE CULTURE     Status: Normal   Collection Time   06/04/12  9:14 PM      Component Value Range Status Comment   Specimen Description URINE, CLEAN CATCH   Final    Special Requests NONE   Final    Culture  Setup Time 06/05/2012 03:24   Final    Colony Count 10,000 COLONIES/ML   Final    Culture     Final    Value: Multiple bacterial morphotypes present, none predominant. Suggest appropriate recollection if clinically indicated.   Report Status 06/06/2012 FINAL   Final   CULTURE, BLOOD (ROUTINE X 2)     Status: Normal (Preliminary result)   Collection Time   06/05/12  9:50 AM      Component Value Range Status Comment   Specimen Description BLOOD ARM  LEFT   Final    Special Requests BOTTLES DRAWN AEROBIC ONLY 5CC   Final    Culture  Setup Time 06/05/2012 15:02   Final    Culture     Final    Value:  BLOOD CULTURE RECEIVED NO GROWTH TO DATE CULTURE WILL BE HELD FOR 5 DAYS BEFORE ISSUING A FINAL NEGATIVE REPORT   Report Status PENDING   Incomplete      Labs: Basic Metabolic Panel:  Lab 06/08/12 1610 06/07/12 1753 06/07/12 0835 06/06/12 0547 06/05/12 0936 06/04/12 1455  NA 140 -- 138 135 137 133*  K 4.3 3.9 3.0* 3.2* 3.9 --  CL 109 -- 104 105 106 102  CO2 25 -- 25 23 23 19   GLUCOSE 96 -- 84 104* 91 110*  BUN 6 -- 5* 5* 4* 4*  CREATININE 0.68 -- 0.70 0.70 0.57 0.61  CALCIUM 7.8* -- 7.6* 7.5* 8.1* 8.5  MG 1.6 -- 1.2* -- 1.1* --  PHOS -- -- -- -- 3.7 --   Liver Function Tests:  Lab 06/07/12 0835 06/06/12 0547 06/05/12 0936 06/04/12 1455  AST 22 25 33 37  ALT 10 10 13 15   ALKPHOS 60 58 50 61  BILITOT 0.8 0.9 1.5* 0.8  PROT 5.5* 5.2* 5.7* 7.1  ALBUMIN 1.8* 1.7* 2.0* 2.4*   No results found for this basename: LIPASE:5,AMYLASE:5 in the last 168 hours No results found for this basename: AMMONIA:5 in the last 168 hours CBC:  Lab 06/10/12 0550 06/09/12 0537 06/08/12 0500 06/07/12 0835 06/06/12 2150 06/04/12 1455  WBC 3.8* 3.6* 4.9 4.5 4.3 --  NEUTROABS -- -- -- -- -- 5.8  HGB 10.3* 10.1* 10.4* 10.4* 8.1* --  HCT 29.3* 28.7* 29.5* 29.2* 22.4* --  MCV 91.6 91.4 89.9 88.5 92.2 --  PLT 137* 135* 122* 129* 133* --   Cardiac Enzymes: No results found for this basename: CKTOTAL:5,CKMB:5,CKMBINDEX:5,TROPONINI:5 in the last 168 hours BNP: BNP (last 3 results) No results found for this basename: PROBNP:3 in the last 8760 hours CBG: No results found for this basename: GLUCAP:5 in the last 168 hours  Time coordinating discharge: 55 minutes  Signed:  Nyree Yonker  [facility] 06/10/2012, 12:43 PM   Addendum: Patient's discharge was held yesterday as he had some pink tinged urine. His repeat UA cleared up, and his  hemoglobin has been stable so far. Continue with same treatment. He is stable for discharge to SNF.

## 2012-06-11 LAB — URINALYSIS, ROUTINE W REFLEX MICROSCOPIC
Glucose, UA: NEGATIVE mg/dL
Specific Gravity, Urine: 1.009 (ref 1.005–1.030)
Urobilinogen, UA: 2 mg/dL — ABNORMAL HIGH (ref 0.0–1.0)
pH: 8 (ref 5.0–8.0)

## 2012-06-11 LAB — CBC
HCT: 30.7 % — ABNORMAL LOW (ref 39.0–52.0)
Hemoglobin: 10.7 g/dL — ABNORMAL LOW (ref 13.0–17.0)
MCV: 93.9 fL (ref 78.0–100.0)
RBC: 3.27 MIL/uL — ABNORMAL LOW (ref 4.22–5.81)
WBC: 3.1 10*3/uL — ABNORMAL LOW (ref 4.0–10.5)

## 2012-06-11 LAB — PROTIME-INR: INR: 1.55 — ABNORMAL HIGH (ref 0.00–1.49)

## 2012-06-11 LAB — CULTURE, BLOOD (ROUTINE X 2): Culture: NO GROWTH

## 2012-06-11 LAB — URINE MICROSCOPIC-ADD ON

## 2012-06-11 NOTE — Progress Notes (Signed)
Pt d/c via EMS in stable condition to [place].  All belongings sent with patient.

## 2012-06-11 NOTE — Progress Notes (Signed)
Pt to transfer to [facility] today via [facility]. Per MD, medical issues have resolved and pt stable for d/c.  D/C packet complete with chart copy and signed FL2. Updated d/c summ faxed to SNF. CSW signing off as no other CSW needs identified.  Dellie Burns, MSW, LCSWA [phone] (Weekends 8:00am-4:30pm)

## 2012-06-15 NOTE — Progress Notes (Signed)
Utilization review completed. Elvan Ebron, RN, BSN. 

## 2013-08-26 ENCOUNTER — Emergency Department (HOSPITAL_COMMUNITY): Payer: Medicare Other

## 2013-08-26 ENCOUNTER — Emergency Department (INDEPENDENT_AMBULATORY_CARE_PROVIDER_SITE_OTHER)
Admission: EM | Admit: 2013-08-26 | Discharge: 2013-08-26 | Disposition: A | Discharge status: Nursing Home (Includes ICF) | Payer: Medicare Other | Source: Home / Self Care | Attending: Family Medicine | Admitting: Family Medicine

## 2013-08-26 ENCOUNTER — Encounter (HOSPITAL_COMMUNITY): Payer: Self-pay | Admitting: Emergency Medicine

## 2013-08-26 ENCOUNTER — Emergency Department (HOSPITAL_COMMUNITY)
Admission: EM | Admit: 2013-08-26 | Discharge: 2013-08-26 | Disposition: A | Discharge status: Home / Self Care | Payer: Medicare Other | Attending: Emergency Medicine | Admitting: Emergency Medicine

## 2013-08-26 DIAGNOSIS — Z79899 Other long term (current) drug therapy: Secondary | ICD-10-CM | POA: Insufficient documentation

## 2013-08-26 DIAGNOSIS — R29898 Other symptoms and signs involving the musculoskeletal system: Secondary | ICD-10-CM

## 2013-08-26 DIAGNOSIS — W19XXXA Unspecified fall, initial encounter: Secondary | ICD-10-CM

## 2013-08-26 DIAGNOSIS — W010XXA Fall on same level from slipping, tripping and stumbling without subsequent striking against object, initial encounter: Secondary | ICD-10-CM | POA: Insufficient documentation

## 2013-08-26 DIAGNOSIS — M6281 Muscle weakness (generalized): Secondary | ICD-10-CM | POA: Insufficient documentation

## 2013-08-26 DIAGNOSIS — Y9301 Activity, walking, marching and hiking: Secondary | ICD-10-CM | POA: Insufficient documentation

## 2013-08-26 DIAGNOSIS — M542 Cervicalgia: Secondary | ICD-10-CM

## 2013-08-26 DIAGNOSIS — S0993XA Unspecified injury of face, initial encounter: Secondary | ICD-10-CM | POA: Insufficient documentation

## 2013-08-26 DIAGNOSIS — W1809XA Striking against other object with subsequent fall, initial encounter: Secondary | ICD-10-CM | POA: Insufficient documentation

## 2013-08-26 DIAGNOSIS — Z8619 Personal history of other infectious and parasitic diseases: Secondary | ICD-10-CM | POA: Insufficient documentation

## 2013-08-26 DIAGNOSIS — G589 Mononeuropathy, unspecified: Secondary | ICD-10-CM | POA: Insufficient documentation

## 2013-08-26 DIAGNOSIS — Z8719 Personal history of other diseases of the digestive system: Secondary | ICD-10-CM | POA: Insufficient documentation

## 2013-08-26 DIAGNOSIS — I10 Essential (primary) hypertension: Secondary | ICD-10-CM | POA: Insufficient documentation

## 2013-08-26 DIAGNOSIS — S199XXA Unspecified injury of neck, initial encounter: Secondary | ICD-10-CM

## 2013-08-26 DIAGNOSIS — Z7901 Long term (current) use of anticoagulants: Secondary | ICD-10-CM | POA: Insufficient documentation

## 2013-08-26 DIAGNOSIS — S0990XA Unspecified injury of head, initial encounter: Secondary | ICD-10-CM | POA: Insufficient documentation

## 2013-08-26 DIAGNOSIS — Y929 Unspecified place or not applicable: Secondary | ICD-10-CM | POA: Insufficient documentation

## 2013-08-26 MED ORDER — HYDROMORPHONE HCL PF 1 MG/ML IJ SOLN
1.0000 mg | Freq: Once | INTRAMUSCULAR | Status: AC
  Administered 2013-08-26: 1 mg via INTRAMUSCULAR
  Filled 2013-08-26: qty 1

## 2013-08-26 MED ORDER — IBUPROFEN 800 MG PO TABS: 800.0000 mg | ORAL_TABLET | Freq: Three times a day (TID) | ORAL | Status: DC

## 2013-08-26 MED ORDER — DIAZEPAM 5 MG/ML IJ SOLN
5.0000 mg | Freq: Once | INTRAMUSCULAR | Status: AC
  Administered 2013-08-26: 5 mg via INTRAMUSCULAR
  Filled 2013-08-26: qty 2

## 2013-08-26 MED ORDER — OXYCODONE-ACETAMINOPHEN 5-325 MG PO TABS: 2.0000 | ORAL_TABLET | Freq: Four times a day (QID) | ORAL | Status: DC | PRN

## 2013-08-26 MED ORDER — DIAZEPAM 5 MG PO TABS: 5.0000 mg | ORAL_TABLET | Freq: Four times a day (QID) | ORAL | Status: DC | PRN

## 2013-08-26 NOTE — ED Notes (Addendum)
To room via [facility].  Onset yesterday pt tripped and fell and hit head and neck on side of bed.  Was found 4 hours later by friend in floor.  Went to [facility] today to be seen for right sided weakness, neck and head pain.

## 2013-08-26 NOTE — ED Provider Notes (Signed)
Nathan Frank is a 72 y.o. male who presents to Urgent Care today for neck pain and right upper extremity weakness. Patient fell yesterday evening landing against the side of his bed with his head and neck. He notes moderate to severe neck pain  and mild weakness of the right arm. He is right-handed. He has not tried any medications yet. He has a history of DVT and has orders for warfarin but is not currently taking any anticoagulation. He notes headache as well. He denies any numbness fevers or chills.   Past Medical History  Diagnosis Date  . Neuropathy   . Hypertension   . Hepatitis C   . Cirrhosis 06/04/2012   History  Substance Use Topics  . Smoking status: Never Smoker   . Smokeless tobacco: Not on file  . Alcohol Use: Yes     Comment: half a pint of vine a day   ROS as above Medications reviewed. No current facility-administered medications for this encounter.   Current Outpatient Prescriptions  Medication Sig Dispense Refill  . enoxaparin (LOVENOX) 80 MG/0.8ML injection Inject 0.7 mLs (70 mg total) into the skin every 12 (twelve) hours.  22.4 Syringe  0  . feeding supplement (ENSURE COMPLETE) LIQD Take 237 mLs by mouth 2 (two) times daily between meals.      . furosemide (LASIX) 20 MG tablet Take 20 mg by mouth daily.      Marland Kitchen gabapentin (NEURONTIN) 100 MG capsule Take 1 capsule (100 mg total) by mouth 3 (three) times daily.      . Levothyroxine Sodium (SYNTHROID PO) Take 1 tablet by mouth daily. Pt doesn't know dose of medication. He uses the [place] pharmacy so pharmacy cant confirm dosage.      . Multiple Vitamin (MULTIVITAMIN WITH MINERALS) TABS Take 1 tablet by mouth daily.      Marland Kitchen omeprazole (PRILOSEC) 20 MG capsule Take 20 mg by mouth daily.      . potassium chloride SA (K-DUR,KLOR-CON) 20 MEQ tablet Take 1 tablet (20 mEq total) by mouth daily.      Marland Kitchen spironolactone (ALDACTONE) 12.5 mg TABS Take 0.5 tablets (12.5 mg total) by mouth daily.      . traMADol (ULTRAM) 50 MG tablet  Take 50 mg by mouth every 6 (six) hours as needed. pain      . traZODone (DESYREL) 100 MG tablet Take 100 mg by mouth at bedtime.      Marland Kitchen warfarin (COUMADIN) 5 MG tablet Take 1 tablet (5 mg total) by mouth one time only at 6 PM.        Exam:  BP 154/100  Pulse 95  Temp(Src) 98.7 F (37.1 C) (Oral)  Resp 20  SpO2 98% Gen: Well NAD NECK: Mildly tender to the spinal midline of the C2/C3 C. area. More tender on the right cervical paraspinals. Range of motion was not tested Upper extremity strength testing: Grip strength is slightly weak on the right side compared to the left side. Sensation is intact throughout.  Alert and oriented x3.   Assessment and Plan: 72 y.o. male with neck pain, right upper extremity strength and headache following a fall. This is concerning for C-spine injury +/- intercranial bleed.  The medical team carefully place patient into a rigid c-collar in place him on to a backboard. Transfer to the emergency room via EMS.       Rodolph Bong, MD 08/26/13 [phone]

## 2013-08-26 NOTE — ED Notes (Signed)
Patient requesting sister Trellis Moment [phone]) be called and made aware of patient's condition and transfer.

## 2013-08-26 NOTE — ED Notes (Signed)
Pt is being discharged will try to call someone to pick him up since he received IM meds at around 5pm, otherwise pt will sit in lobby until 9pm per charge nurse.

## 2013-08-26 NOTE — ED Notes (Signed)
Pt fell yesterday in his bedroom landed on right side and hit the bed. Has weakness in his left side and neck is sensitive to ROM. Numbness and tingling in left side arm. Pt is alert and oriented.

## 2013-08-26 NOTE — ED Notes (Signed)
Patient has hat and jacket in patient belonging bag.

## 2013-08-26 NOTE — ED Notes (Signed)
Pt placed on spinal board resting comfortably.

## 2013-08-26 NOTE — ED Provider Notes (Signed)
CSN: [id number]     Arrival date & time 08/26/13  1456 History   First MD Initiated Contact with Patient 08/26/13 1457     Chief Complaint  Patient presents with  . Neck Pain  . Weakness   (Consider location/radiation/quality/duration/timing/severity/associated sxs/prior Treatment) The history is provided by the patient.  Nathan Frank is a 72 y.o. male history of hypertension, hepatitis C, neuropathy here presenting with fall and right-sided weakness. He was walking yesterday and fell and hit his head on the bed. He then had some right-sided neck pain as well as increasing weakness and headaches. Denies any other injuries. Not taking any anticoagulants. Went to the urgent care sent here for CT head and neck.    Past Medical History  Diagnosis Date  . Neuropathy   . Hypertension   . Hepatitis C   . Cirrhosis 06/04/2012   Past Surgical History  Procedure Laterality Date  . Esophagogastroduodenoscopy  06/07/2012    Procedure: ESOPHAGOGASTRODUODENOSCOPY (EGD);  Surgeon: Rachael Fee, MD;  Location: [facility] ENDOSCOPY;  Service: Endoscopy;  Laterality: N/A;  may need treatment of varices   Family History  Problem Relation Age of Onset  . Stroke Father   . Prostate cancer Brother    History  Substance Use Topics  . Smoking status: Never Smoker   . Smokeless tobacco: Not on file  . Alcohol Use: Yes     Comment: half a pint of vine a day    Review of Systems  Musculoskeletal: Positive for neck pain.  Neurological: Positive for weakness.  All other systems reviewed and are negative.    Allergies  Review of patient's allergies indicates no known allergies.  Home Medications   Current Outpatient Rx  Name  Route  Sig  Dispense  Refill  . Multiple Vitamin (MULTIVITAMIN WITH MINERALS) TABS   Oral   Take 1 tablet by mouth daily.         . traZODone (DESYREL) 100 MG tablet   Oral   Take 100 mg by mouth at bedtime.         Marland Kitchen EXPIRED: gabapentin (NEURONTIN) 100 MG capsule  Oral   Take 1 capsule (100 mg total) by mouth 3 (three) times daily.         Marland Kitchen EXPIRED: potassium chloride SA (K-DUR,KLOR-CON) 20 MEQ tablet   Oral   Take 1 tablet (20 mEq total) by mouth daily.         Marland Kitchen EXPIRED: warfarin (COUMADIN) 5 MG tablet   Oral   Take 1 tablet (5 mg total) by mouth one time only at 6 PM.           Check INR tomorrow and keep it between 2 to 3.    BP 147/89  Pulse 82  Temp(Src) 98.2 F (36.8 C) (Oral)  Resp 20  SpO2 96% Physical Exam  Nursing note and vitals reviewed. Constitutional: He is oriented to person, place, and time. He appears well-nourished.  Boarded and collared, uncomfortable   HENT:  Head: Normocephalic.  Mouth/Throat: Oropharynx is clear and moist.  Eyes: Conjunctivae are normal. Pupils are equal, round, and reactive to light.  Neck:  C collar in place   Cardiovascular: Normal rate, regular rhythm and normal heart sounds.   Pulmonary/Chest: Effort normal and breath sounds normal. No respiratory distress. He has no wheezes. He has no rales.  Abdominal: Soft. Bowel sounds are normal. He exhibits no distension. There is no tenderness. There is no rebound and no guarding.  Musculoskeletal:  Normal range of motion.  Extremity nl ROM. ? Midline cervical spine tenderness but otherwise no midline tenderness   Neurological: He is alert and oriented to person, place, and time.  Slightly dec R arm grip but nl R arm flexion, extension, and R leg flexion and extension compared to L side   Skin: Skin is warm and dry.  Psychiatric: He has a normal mood and affect. His behavior is normal. Judgment and thought content normal.    ED Course  Procedures (including critical care time) Labs Review Labs Reviewed - No data to display Imaging Review Ct Head Wo Contrast  08/26/2013   CLINICAL DATA:  Larey Seat and hit head yesterday with head and neck pain today, right arm weakness  EXAM: CT HEAD WITHOUT CONTRAST  CT CERVICAL SPINE WITHOUT CONTRAST   TECHNIQUE: Multidetector CT imaging of the head and cervical spine was performed following the standard protocol without intravenous contrast. Multiplanar CT image reconstructions of the cervical spine were also generated.  COMPARISON:  10/20/2011  FINDINGS: CT HEAD FINDINGS  Mild age-related atrophy and low attenuation in the deep white matter. No evidence of hemorrhage, infarct, or extra-axial fluid. No mass, hydrocephalus, or skull fracture.  CT CERVICAL SPINE FINDINGS  Normal alignment with no significant degenerative change. No fracture or prevertebral soft tissue swelling.  IMPRESSION: No acute traumatic injury involving the cervical spine. No acute intracranial abnormality.   Electronically Signed   By: Esperanza Heir M.D.   On: 08/26/2013 16:40   Ct Cervical Spine Wo Contrast  08/26/2013   CLINICAL DATA:  Larey Seat and hit head yesterday with head and neck pain today, right arm weakness  EXAM: CT HEAD WITHOUT CONTRAST  CT CERVICAL SPINE WITHOUT CONTRAST  TECHNIQUE: Multidetector CT imaging of the head and cervical spine was performed following the standard protocol without intravenous contrast. Multiplanar CT image reconstructions of the cervical spine were also generated.  COMPARISON:  10/20/2011  FINDINGS: CT HEAD FINDINGS  Mild age-related atrophy and low attenuation in the deep white matter. No evidence of hemorrhage, infarct, or extra-axial fluid. No mass, hydrocephalus, or skull fracture.  CT CERVICAL SPINE FINDINGS  Normal alignment with no significant degenerative change. No fracture or prevertebral soft tissue swelling.  IMPRESSION: No acute traumatic injury involving the cervical spine. No acute intracranial abnormality.   Electronically Signed   By: Esperanza Heir M.D.   On: 08/26/2013 16:40    EKG Interpretation   None       MDM  No diagnosis found. Nathan Frank is a 72 y.o. male here s/p fall. Will get CT head/neck to r/o bleed vs fracture. Will give pain meds.   5:16 PM CT  head/neck showed no bleed or fracture. He has R sided muscle spasms, likely contributing to his symptoms. Given dilaudid and valium, felt better. Stable for d/c.    Richardean Canal, MD 08/26/13 [phone]

## 2013-12-12 ENCOUNTER — Emergency Department (HOSPITAL_COMMUNITY): Payer: Medicare Other

## 2013-12-12 ENCOUNTER — Encounter (HOSPITAL_COMMUNITY): Payer: Self-pay | Admitting: Emergency Medicine

## 2013-12-12 ENCOUNTER — Emergency Department (HOSPITAL_COMMUNITY)
Admission: EM | Admit: 2013-12-12 | Discharge: 2013-12-12 | Disposition: A | Discharge status: Home / Self Care | Payer: Medicare Other | Attending: Emergency Medicine | Admitting: Emergency Medicine

## 2013-12-12 DIAGNOSIS — R079 Chest pain, unspecified: Secondary | ICD-10-CM | POA: Insufficient documentation

## 2013-12-12 DIAGNOSIS — Z8669 Personal history of other diseases of the nervous system and sense organs: Secondary | ICD-10-CM | POA: Insufficient documentation

## 2013-12-12 DIAGNOSIS — I10 Essential (primary) hypertension: Secondary | ICD-10-CM | POA: Insufficient documentation

## 2013-12-12 DIAGNOSIS — Z8619 Personal history of other infectious and parasitic diseases: Secondary | ICD-10-CM | POA: Insufficient documentation

## 2013-12-12 DIAGNOSIS — R109 Unspecified abdominal pain: Secondary | ICD-10-CM

## 2013-12-12 DIAGNOSIS — K8020 Calculus of gallbladder without cholecystitis without obstruction: Secondary | ICD-10-CM | POA: Insufficient documentation

## 2013-12-12 DIAGNOSIS — Z791 Long term (current) use of non-steroidal anti-inflammatories (NSAID): Secondary | ICD-10-CM | POA: Insufficient documentation

## 2013-12-12 LAB — LIPASE, BLOOD: Lipase: 42 U/L (ref 11–59)

## 2013-12-12 LAB — URINALYSIS, ROUTINE W REFLEX MICROSCOPIC
Glucose, UA: NEGATIVE mg/dL
HGB URINE DIPSTICK: NEGATIVE
Ketones, ur: 15 mg/dL — AB
NITRITE: POSITIVE — AB
Protein, ur: 30 mg/dL — AB
Urobilinogen, UA: 1 mg/dL (ref 0.0–1.0)
pH: 6 (ref 5.0–8.0)

## 2013-12-12 LAB — URINE MICROSCOPIC-ADD ON

## 2013-12-12 LAB — CBC
HCT: 43.3 % (ref 39.0–52.0)
HEMOGLOBIN: 15.8 g/dL (ref 13.0–17.0)
MCH: 33.5 pg (ref 26.0–34.0)
MCHC: 36.5 g/dL — AB (ref 30.0–36.0)
MCV: 91.9 fL (ref 78.0–100.0)
PLATELETS: 131 10*3/uL — AB (ref 150–400)
RBC: 4.71 MIL/uL (ref 4.22–5.81)
RDW: 14.8 % (ref 11.5–15.5)
WBC: 4.4 10*3/uL (ref 4.0–10.5)

## 2013-12-12 LAB — COMPREHENSIVE METABOLIC PANEL
ALBUMIN: 3.5 g/dL (ref 3.5–5.2)
ALK PHOS: 89 U/L (ref 39–117)
ALT: 45 U/L (ref 0–53)
AST: 97 U/L — ABNORMAL HIGH (ref 0–37)
BUN: 18 mg/dL (ref 6–23)
CALCIUM: 9.2 mg/dL (ref 8.4–10.5)
CO2: 24 mEq/L (ref 19–32)
CREATININE: 0.82 mg/dL (ref 0.50–1.35)
Chloride: 98 mEq/L (ref 96–112)
GFR calc non Af Amer: 86 mL/min — ABNORMAL LOW (ref >90)
GLUCOSE: 113 mg/dL — AB (ref 70–99)
POTASSIUM: 3.4 meq/L — AB (ref 3.7–5.3)
Sodium: 138 mEq/L (ref 137–147)
Total Bilirubin: 1.1 mg/dL (ref 0.3–1.2)
Total Protein: 7.5 g/dL (ref 6.0–8.3)

## 2013-12-12 LAB — I-STAT CG4 LACTIC ACID, ED: LACTIC ACID, VENOUS: 1.72 mmol/L (ref 0.5–2.2)

## 2013-12-12 LAB — PROTIME-INR
INR: 1.15 (ref 0.00–1.49)
PROTHROMBIN TIME: 14.5 s (ref 11.6–15.2)

## 2013-12-12 LAB — I-STAT TROPONIN, ED: TROPONIN I, POC: 0.04 ng/mL (ref 0.00–0.08)

## 2013-12-12 MED ORDER — HYDROCODONE-ACETAMINOPHEN 5-325 MG PO TABS: 2.0000 | ORAL_TABLET | ORAL | Status: DC | PRN

## 2013-12-12 MED ORDER — HYDROMORPHONE HCL PF 1 MG/ML IJ SOLN
1.0000 mg | Freq: Once | INTRAMUSCULAR | Status: AC
  Administered 2013-12-12: 1 mg via INTRAVENOUS
  Filled 2013-12-12: qty 1

## 2013-12-12 MED ORDER — ONDANSETRON HCL 4 MG/2ML IJ SOLN
4.0000 mg | Freq: Once | INTRAMUSCULAR | Status: AC
  Administered 2013-12-12: 4 mg via INTRAVENOUS
  Filled 2013-12-12: qty 2

## 2013-12-12 MED ORDER — SODIUM CHLORIDE 0.9 % IV SOLN
Freq: Once | INTRAVENOUS | Status: AC
  Administered 2013-12-12: 06:00:00 via INTRAVENOUS

## 2013-12-12 MED ORDER — IOHEXOL 300 MG/ML  SOLN
25.0000 mL | INTRAMUSCULAR | Status: AC
  Administered 2013-12-12: 25 mL via ORAL

## 2013-12-12 MED ORDER — IOHEXOL 300 MG/ML  SOLN
80.0000 mL | Freq: Once | INTRAMUSCULAR | Status: AC | PRN
  Administered 2013-12-12: 80 mL via INTRAVENOUS

## 2013-12-12 MED ORDER — ONDANSETRON 4 MG PO TBDP: 4.0000 mg | ORAL_TABLET | Freq: Three times a day (TID) | ORAL | Status: DC | PRN

## 2013-12-12 MED ORDER — OMEPRAZOLE 20 MG PO CPDR: 20.0000 mg | DELAYED_RELEASE_CAPSULE | Freq: Two times a day (BID) | ORAL | Status: DC

## 2013-12-12 NOTE — ED Provider Notes (Signed)
11:23:  I reexamined Mr. Bowland several times. He is in initial report to me was diffuse periumbilical and infraumbilical pain. Display no peritoneal irritation. He has not have reproducible tenderness in his right upper abdomen. CT scan suggests gallbladder wall thickening and pericholecystic fluid without diffuse ascites. Also multiple calcified stones. Ultrasound obtained. No reproducible Murphy's sign sonographically. His hepatobiliary or pancreatic enzymes are not elevated or concerning.   I think his graphic findings are likely related to his cirrhosis. His symptoms and findings are not consistent with acute cholecystitis. I discussed with him hospitalization and hydroscan. He states fairly adamantly that he does not want to stay in the hospital. He states he has pets at home and bills to pay. He states he feels well. He politely declines admission.  I do not think this is unreasonable at this time. The symptoms are controlled. I've given him carefully structures return if fever, nausea, vomiting, dark urine, light stools, blood in stool or emesis, or any worsening at home or intolerance of symptoms.  He expresses understanding and agrees to return if any changes. If he does return ask him to make arrangements to be able to stay i.e. to pay his bills and arrange for pet care.  I offered to have our care manager and social worker visit with him about some other arrangements for at home and he again politely declined declines. He is awake alert lucid and oriented and I feel he is making an informed decision.  Tanna Furry, MD 12/12/13 1126

## 2013-12-12 NOTE — ED Notes (Signed)
Patient presents stating has had some chest pain to the left chest but hurts mostly in his stomach and right side.  States he has cirrohsis and hep C

## 2013-12-12 NOTE — ED Notes (Signed)
Patient to room from xray.  Patient placed on cardiac monitor.

## 2013-12-12 NOTE — ED Provider Notes (Signed)
CSN: [id number]     Arrival date & time 12/12/13  0408 History   First MD Initiated Contact with Patient 12/12/13 [phone]     Chief Complaint  Patient presents with  . Chest Pain  . Abdominal Pain     (Consider location/radiation/quality/duration/timing/severity/associated sxs/prior Treatment) HPI Comments: 73 year old male with a history of hypertension, hepatitis C and cirrhosis who had an EGD performed in 2013 at which time showed no signs of portal hypertension or varices but did show mild gastritis. He states that he drinks daily, he usually drinks 2 glasses of wine a day but has not had any alcohol in 3 days. He reports abdominal pain for the last 2 days, located in the suprapubic and upper abdomen, right and left lower quadrants. This does not radiate, it is associated with multiple episodes of nausea and vomiting of yellowish material and diarrhea which has the appearance of mucousy yellow stool. He denies blood in his stools. He has had an unintentional weight loss which he has noticed because his pants are far too large on him and now.  There has been no fevers chills coughing shortness of breath or peripheral edema.  Patient is a 73 y.o. male presenting with chest pain and abdominal pain. The history is provided by the patient.  Chest Pain Associated symptoms: abdominal pain   Abdominal Pain Associated symptoms: chest pain     Past Medical History  Diagnosis Date  . Neuropathy   . Hypertension   . Hepatitis C   . Cirrhosis 06/04/2012   Past Surgical History  Procedure Laterality Date  . Esophagogastroduodenoscopy  06/07/2012    Procedure: ESOPHAGOGASTRODUODENOSCOPY (EGD);  Surgeon: Milus Banister, MD;  Location: [facility];  Service: Endoscopy;  Laterality: N/A;  may need treatment of varices   Family History  Problem Relation Age of Onset  . Stroke Father   . Prostate cancer Brother    History  Substance Use Topics  . Smoking status: Never Smoker   . Smokeless  tobacco: Not on file  . Alcohol Use: Yes     Comment: half a pint of vine a day    Review of Systems  Cardiovascular: Positive for chest pain.  Gastrointestinal: Positive for abdominal pain.  All other systems reviewed and are negative.      Allergies  Review of patient's allergies indicates no known allergies.  Home Medications   Current Outpatient Rx  Name  Route  Sig  Dispense  Refill  . diazepam (VALIUM) 5 MG tablet   Oral   Take 1 tablet (5 mg total) by mouth every 6 (six) hours as needed for muscle spasms (spasms).   10 tablet   0   . EXPIRED: gabapentin (NEURONTIN) 100 MG capsule   Oral   Take 1 capsule (100 mg total) by mouth 3 (three) times daily.         Marland Kitchen ibuprofen (ADVIL,MOTRIN) 800 MG tablet   Oral   Take 1 tablet (800 mg total) by mouth 3 (three) times daily.   21 tablet   0   . Multiple Vitamin (MULTIVITAMIN WITH MINERALS) TABS   Oral   Take 1 tablet by mouth daily.         Marland Kitchen oxyCODONE-acetaminophen (PERCOCET) 5-325 MG per tablet   Oral   Take 2 tablets by mouth every 6 (six) hours as needed.   10 tablet   0   . EXPIRED: potassium chloride SA (K-DUR,KLOR-CON) 20 MEQ tablet   Oral   Take  1 tablet (20 mEq total) by mouth daily.         . traZODone (DESYREL) 100 MG tablet   Oral   Take 100 mg by mouth at bedtime.         Marland Kitchen EXPIRED: warfarin (COUMADIN) 5 MG tablet   Oral   Take 1 tablet (5 mg total) by mouth one time only at 6 PM.           Check INR tomorrow and keep it between 2 to 3.    BP 141/90  Pulse 85  Temp(Src) 97.7 F (36.5 C) (Oral)  Resp 16  Ht 6' (1.829 m)  Wt 170 lb (77.111 kg)  BMI 23.05 kg/m2  SpO2 98% Physical Exam  Nursing note and vitals reviewed. Constitutional: He appears well-developed and well-nourished. No distress.  HENT:  Head: Normocephalic and atraumatic.  Mouth/Throat: Oropharynx is clear and moist. No oropharyngeal exudate.  Eyes: Conjunctivae and EOM are normal. Pupils are equal, round,  and reactive to light. Right eye exhibits no discharge. Left eye exhibits no discharge. No scleral icterus.  Neck: Normal range of motion. Neck supple. No JVD present. No thyromegaly present.  Cardiovascular: Normal rate, regular rhythm, normal heart sounds and intact distal pulses.  Exam reveals no gallop and no friction rub.   No murmur heard. Pulmonary/Chest: Effort normal and breath sounds normal. No respiratory distress. He has no wheezes. He has no rales.  Abdominal: Soft. Bowel sounds are normal. He exhibits no distension and no mass. There is tenderness ( Focal tenderness to palpation in the suprapubic and lower abdomen, this is nonfocal, nonspecific to the right or lower left quadrants. Epigastric pain present). There is no rebound and no guarding.  Musculoskeletal: Normal range of motion. He exhibits no edema and no tenderness.  Lymphadenopathy:    He has no cervical adenopathy.  Neurological: He is alert. Coordination normal.  Skin: Skin is warm and dry. No rash noted. No erythema.  Psychiatric: He has a normal mood and affect. His behavior is normal.    ED Course  Procedures (including critical care time) Labs Review Labs Reviewed  CBC - Abnormal; Notable for the following:    MCHC 36.5 (*)    Platelets 131 (*)    All other components within normal limits  COMPREHENSIVE METABOLIC PANEL  LIPASE, BLOOD  URINALYSIS, ROUTINE W REFLEX MICROSCOPIC  PROTIME-INR  I-STAT TROPOININ, ED  I-STAT CG4 LACTIC ACID, ED   Imaging Review Dg Chest 2 View  12/12/2013   CLINICAL DATA:  Chest and abdominal pain.  EXAM: CHEST  2 VIEW  COMPARISON:  DG CHEST 1V PORT dated 06/06/2012  FINDINGS: Cardiomediastinal silhouette is unremarkable. The lungs are clear without pleural effusions or focal consolidations. Trachea projects midline and there is no pneumothorax. Soft tissue planes and included osseous structures are non-suspicious. Interval removal of PICC line.  IMPRESSION: No acute cardiopulmonary  process.   Electronically Signed   By: Elon Alas   On: 12/12/2013 06:05     EKG Interpretation  Date/Time:  Tuesday December 12 2013 04:14:28 EDT Ventricular Rate:  114 PR Interval:  100 QRS Duration: 118 QT Interval:  364 QTC Calculation: 501 R Axis:   -86 Text Interpretation:  Sinus tachycardia with short PR with occasional Premature ventricular complexes and Fusion complexes Right bundle branch block Left anterior fascicular block Abnormal ECG No old tracing to compare Reconfirmed by Sharod Petsch  MD, Sabrinia Prien ([id number]) on 12/12/2013 7:40:34 AM        MDM  Final diagnoses:  None    The patient has no guarding, no peritoneal or surgical signs, definitely has abdominal pain and a history of weight loss which is concerning for cancer especially given his history of cirrhosis and hepatitis C. CT scan of the abdomen and pelvis is pending, pain medication and fluids given, will also add coag as he has a history of Coumadin.  Change of shift - care signed out to Dr. Blima Ledger, MD 12/12/13 [phone]

## 2013-12-12 NOTE — ED Notes (Signed)
EDP at the bedside.  ?

## 2013-12-12 NOTE — ED Notes (Signed)
Patient states he is depressed now after asking all the questions regarding depression and anxiety

## 2013-12-12 NOTE — Discharge Instructions (Signed)
Abdominal Pain, Adult °Many things can cause abdominal pain. Usually, abdominal pain is not caused by a disease and will improve without treatment. It can often be observed and treated at home. Your health care provider will do a physical exam and possibly order blood tests and X-rays to help determine the seriousness of your pain. However, in many cases, more time must pass before a clear cause of the pain can be found. Before that point, your health care provider may not know if you need more testing or further treatment. °HOME CARE INSTRUCTIONS  °Monitor your abdominal pain for any changes. The following actions may help to alleviate any discomfort you are experiencing: °· Only take over-the-counter or prescription medicines as directed by your health care provider. °· Do not take laxatives unless directed to do so by your health care provider. °· Try a clear liquid diet (broth, tea, or water) as directed by your health care provider. Slowly move to a bland diet as tolerated. °SEEK MEDICAL CARE IF: °· You have unexplained abdominal pain. °· You have abdominal pain associated with nausea or diarrhea. °· You have pain when you urinate or have a bowel movement. °· You experience abdominal pain that wakes you in the night. °· You have abdominal pain that is worsened or improved by eating food. °· You have abdominal pain that is worsened with eating fatty foods. °SEEK IMMEDIATE MEDICAL CARE IF:  °· Your pain does not go away within 2 hours. °· You have a fever. °· You keep throwing up (vomiting). °· Your pain is felt only in portions of the abdomen, such as the right side or the left lower portion of the abdomen. °· You pass bloody or black tarry stools. °MAKE SURE YOU: °· Understand these instructions.   °· Will watch your condition.   °· Will get help right away if you are not doing well or get worse.   °Document Released: 07/01/2005 Document Revised: 07/12/2013 Document Reviewed: 05/31/2013 °[employer]® Patient  Information ©2014 [employer]. ° °

## 2013-12-14 ENCOUNTER — Inpatient Hospital Stay (HOSPITAL_COMMUNITY): Payer: Medicare Other

## 2013-12-14 ENCOUNTER — Encounter (HOSPITAL_COMMUNITY): Payer: Self-pay | Admitting: Emergency Medicine

## 2013-12-14 ENCOUNTER — Observation Stay (HOSPITAL_COMMUNITY)
Admission: EM | Admit: 2013-12-14 | Discharge: 2013-12-15 | Disposition: A | Discharge status: Home / Self Care | Payer: Medicare Other | Attending: Internal Medicine | Admitting: Internal Medicine

## 2013-12-14 DIAGNOSIS — I10 Essential (primary) hypertension: Secondary | ICD-10-CM | POA: Insufficient documentation

## 2013-12-14 DIAGNOSIS — B1920 Unspecified viral hepatitis C without hepatic coma: Secondary | ICD-10-CM | POA: Insufficient documentation

## 2013-12-14 DIAGNOSIS — R109 Unspecified abdominal pain: Secondary | ICD-10-CM

## 2013-12-14 DIAGNOSIS — R000 Tachycardia, unspecified: Secondary | ICD-10-CM

## 2013-12-14 DIAGNOSIS — F1010 Alcohol abuse, uncomplicated: Secondary | ICD-10-CM | POA: Diagnosis present

## 2013-12-14 DIAGNOSIS — K8020 Calculus of gallbladder without cholecystitis without obstruction: Secondary | ICD-10-CM | POA: Diagnosis present

## 2013-12-14 DIAGNOSIS — K7460 Unspecified cirrhosis of liver: Secondary | ICD-10-CM | POA: Diagnosis present

## 2013-12-14 DIAGNOSIS — B1710 Acute hepatitis C without hepatic coma: Secondary | ICD-10-CM | POA: Diagnosis present

## 2013-12-14 DIAGNOSIS — Z79899 Other long term (current) drug therapy: Secondary | ICD-10-CM | POA: Insufficient documentation

## 2013-12-14 DIAGNOSIS — K2970 Gastritis, unspecified, without bleeding: Secondary | ICD-10-CM | POA: Diagnosis present

## 2013-12-14 DIAGNOSIS — K810 Acute cholecystitis: Secondary | ICD-10-CM | POA: Diagnosis present

## 2013-12-14 HISTORY — DX: Alcohol abuse, uncomplicated: F10.10

## 2013-12-14 LAB — CBC
HEMATOCRIT: 40 % (ref 39.0–52.0)
HEMOGLOBIN: 14.5 g/dL (ref 13.0–17.0)
MCH: 33.4 pg (ref 26.0–34.0)
MCHC: 36.3 g/dL — AB (ref 30.0–36.0)
MCV: 92.2 fL (ref 78.0–100.0)
Platelets: 126 10*3/uL — ABNORMAL LOW (ref 150–400)
RBC: 4.34 MIL/uL (ref 4.22–5.81)
RDW: 15 % (ref 11.5–15.5)
WBC: 3.5 10*3/uL — AB (ref 4.0–10.5)

## 2013-12-14 LAB — BASIC METABOLIC PANEL
BUN: 22 mg/dL (ref 6–23)
CO2: 22 mEq/L (ref 19–32)
Calcium: 8.4 mg/dL (ref 8.4–10.5)
Chloride: 103 mEq/L (ref 96–112)
Creatinine, Ser: 0.92 mg/dL (ref 0.50–1.35)
GFR, EST NON AFRICAN AMERICAN: 82 mL/min — AB (ref >90)
GLUCOSE: 113 mg/dL — AB (ref 70–99)
POTASSIUM: 3.3 meq/L — AB (ref 3.7–5.3)
SODIUM: 141 meq/L (ref 137–147)

## 2013-12-14 LAB — I-STAT CG4 LACTIC ACID, ED: Lactic Acid, Venous: 2.16 mmol/L (ref 0.5–2.2)

## 2013-12-14 LAB — PROTIME-INR
INR: 1.2 (ref 0.00–1.49)
Prothrombin Time: 14.9 seconds (ref 11.6–15.2)

## 2013-12-14 LAB — LIPASE, BLOOD: Lipase: 67 U/L — ABNORMAL HIGH (ref 11–59)

## 2013-12-14 LAB — HEPATIC FUNCTION PANEL
ALK PHOS: 115 U/L (ref 39–117)
ALT: 40 U/L (ref 0–53)
AST: 81 U/L — AB (ref 0–37)
Albumin: 3.2 g/dL — ABNORMAL LOW (ref 3.5–5.2)
BILIRUBIN TOTAL: 0.4 mg/dL (ref 0.3–1.2)
Bilirubin, Direct: <0.2 mg/dL (ref 0.0–0.3)
Total Protein: 6.5 g/dL (ref 6.0–8.3)

## 2013-12-14 LAB — I-STAT TROPONIN, ED: Troponin i, poc: 0.01 ng/mL (ref 0.00–0.08)

## 2013-12-14 MED ORDER — TECHNETIUM TC 99M MEBROFENIN IV KIT
5.0000 | PACK | Freq: Once | INTRAVENOUS | Status: AC | PRN
  Administered 2013-12-14: 5 via INTRAVENOUS

## 2013-12-14 MED ORDER — PIPERACILLIN-TAZOBACTAM 4.5 G IVPB: 4.5000 g | Freq: Once | INTRAVENOUS | Status: DC

## 2013-12-14 MED ORDER — LORAZEPAM 2 MG/ML IJ SOLN: 0.0000 mg | Freq: Two times a day (BID) | INTRAMUSCULAR | Status: DC

## 2013-12-14 MED ORDER — SODIUM CHLORIDE 0.9 % IV BOLUS (SEPSIS)
1000.0000 mL | Freq: Once | INTRAVENOUS | Status: AC
  Administered 2013-12-14: 1000 mL via INTRAVENOUS

## 2013-12-14 MED ORDER — VITAMIN B-1 100 MG PO TABS: 100.0000 mg | ORAL_TABLET | Freq: Every day | ORAL | Status: DC

## 2013-12-14 MED ORDER — LORAZEPAM 2 MG/ML IJ SOLN
1.0000 mg | Freq: Once | INTRAMUSCULAR | Status: AC
  Administered 2013-12-14: 1 mg via INTRAVENOUS
  Filled 2013-12-14: qty 1

## 2013-12-14 MED ORDER — PIPERACILLIN-TAZOBACTAM 3.375 G IVPB 30 MIN
3.3750 g | Freq: Once | INTRAVENOUS | Status: AC
Start: 2013-12-14 — End: 2013-12-14
  Administered 2013-12-14: 3.375 g via INTRAVENOUS
  Filled 2013-12-14: qty 50

## 2013-12-14 MED ORDER — LORAZEPAM 2 MG/ML IJ SOLN: 0.0000 mg | Freq: Four times a day (QID) | INTRAMUSCULAR | Status: DC

## 2013-12-14 MED ORDER — THIAMINE HCL 100 MG/ML IJ SOLN: 100.0000 mg | Freq: Every day | INTRAMUSCULAR | Status: DC

## 2013-12-14 MED ORDER — PIPERACILLIN-TAZOBACTAM 3.375 G IVPB 30 MIN
4.5000 g | Freq: Once | INTRAVENOUS | Status: DC
  Filled 2013-12-14: qty 100

## 2013-12-14 MED ORDER — LORAZEPAM 2 MG/ML IJ SOLN
0.0000 mg | Freq: Four times a day (QID) | INTRAMUSCULAR | Status: DC
  Filled 2013-12-14: qty 1

## 2013-12-14 MED ORDER — MORPHINE SULFATE 4 MG/ML IJ SOLN
4.0000 mg | INTRAMUSCULAR | Status: DC | PRN
  Administered 2013-12-14 – 2013-12-15 (×3): 4 mg via INTRAVENOUS
  Filled 2013-12-14 (×3): qty 1

## 2013-12-14 MED ORDER — LORAZEPAM 1 MG PO TABS
1.0000 mg | ORAL_TABLET | Freq: Four times a day (QID) | ORAL | Status: DC | PRN
  Administered 2013-12-15: 1 mg via ORAL
  Filled 2013-12-14: qty 1

## 2013-12-14 MED ORDER — POTASSIUM CHLORIDE 10 MEQ/100ML IV SOLN
10.0000 meq | INTRAVENOUS | Status: AC
  Administered 2013-12-14 (×4): 10 meq via INTRAVENOUS
  Filled 2013-12-14 (×4): qty 100

## 2013-12-14 MED ORDER — LORAZEPAM 1 MG PO TABS: 0.0000 mg | ORAL_TABLET | Freq: Four times a day (QID) | ORAL | Status: DC

## 2013-12-14 MED ORDER — HYDROMORPHONE HCL PF 1 MG/ML IJ SOLN
1.0000 mg | Freq: Once | INTRAMUSCULAR | Status: AC
  Administered 2013-12-14: 1 mg via INTRAVENOUS
  Filled 2013-12-14: qty 1

## 2013-12-14 MED ORDER — PIPERACILLIN-TAZOBACTAM 3.375 G IVPB: 3.3750 g | Freq: Once | INTRAVENOUS | Status: DC

## 2013-12-14 MED ORDER — VITAMIN B-1 100 MG PO TABS
100.0000 mg | ORAL_TABLET | Freq: Every day | ORAL | Status: DC
  Administered 2013-12-14 – 2013-12-15 (×2): 100 mg via ORAL
  Filled 2013-12-14 (×2): qty 1

## 2013-12-14 MED ORDER — LORAZEPAM 1 MG PO TABS: 0.0000 mg | ORAL_TABLET | Freq: Two times a day (BID) | ORAL | Status: DC

## 2013-12-14 MED ORDER — LORAZEPAM 2 MG/ML IJ SOLN
1.0000 mg | Freq: Four times a day (QID) | INTRAMUSCULAR | Status: DC | PRN
  Administered 2013-12-14 – 2013-12-15 (×2): 1 mg via INTRAVENOUS
  Filled 2013-12-14: qty 1

## 2013-12-14 MED ORDER — ONDANSETRON HCL 4 MG/2ML IJ SOLN
4.0000 mg | Freq: Once | INTRAMUSCULAR | Status: AC
  Administered 2013-12-14: 4 mg via INTRAVENOUS
  Filled 2013-12-14: qty 2

## 2013-12-14 MED ORDER — PANTOPRAZOLE SODIUM 40 MG PO TBEC
40.0000 mg | DELAYED_RELEASE_TABLET | Freq: Every day | ORAL | Status: DC
  Administered 2013-12-14 – 2013-12-15 (×2): 40 mg via ORAL
  Filled 2013-12-14 (×2): qty 1

## 2013-12-14 MED ORDER — THIAMINE HCL 100 MG/ML IJ SOLN
100.0000 mg | Freq: Every day | INTRAMUSCULAR | Status: DC
  Filled 2013-12-14 (×2): qty 1

## 2013-12-14 MED ORDER — ONDANSETRON HCL 4 MG/2ML IJ SOLN: 4.0000 mg | Freq: Three times a day (TID) | INTRAMUSCULAR | Status: AC | PRN

## 2013-12-14 MED ORDER — FOLIC ACID 1 MG PO TABS
1.0000 mg | ORAL_TABLET | Freq: Every day | ORAL | Status: DC
  Administered 2013-12-14 – 2013-12-15 (×2): 1 mg via ORAL
  Filled 2013-12-14 (×2): qty 1

## 2013-12-14 MED ORDER — DEXTROSE-NACL 5-0.9 % IV SOLN
INTRAVENOUS | Status: DC
  Administered 2013-12-14 (×2): via INTRAVENOUS

## 2013-12-14 MED ORDER — ADULT MULTIVITAMIN W/MINERALS CH
1.0000 | ORAL_TABLET | Freq: Every day | ORAL | Status: DC
  Administered 2013-12-14 – 2013-12-15 (×2): 1 via ORAL
  Filled 2013-12-14 (×2): qty 1

## 2013-12-14 NOTE — ED Notes (Addendum)
Spoke with nuclear med, cannot do the Hepatobiliary Liver Function study until six hours after the dilaudid was given.  She advised not to give him anymore opiate based pain meds until after the study.

## 2013-12-14 NOTE — ED Notes (Signed)
PT c/o mid epigastric pain, right upper quadrant pain and chest pain since last week.

## 2013-12-14 NOTE — H&P (Signed)
Internal Medicine Attending Admission Note Date: 12/14/2013  Patient name: Nathan Frank Medical record number: [id number] Date of birth: Aug 11, 1941 Age: 73 y.o. Gender: male  I saw and evaluated the patient. I reviewed the resident's note and I agree with the resident's findings and plan as documented in the resident's note, with the following additional comments.  Chief Complaint(s): Abdominal pain  History - key components related to admission: Patient is a 73 year old man with history of alcohol abuse, cirrhosis, hepatitis C, hypertension, and other problems as outlined in medical history, admitted with complaint of upper abdominal pain which started about 5 days ago.  He reports some associated nausea and vomiting which have now; he denies hematemesis.   Physical Exam - key components related to admission:  Filed Vitals:   12/14/13 0900 12/14/13 1049 12/14/13 1207 12/14/13 1411  BP: 114/77 106/71 118/79 117/75  Pulse: 82 75 72 69  Temp:    98 F (36.7 C)  TempSrc:    Oral  Resp: 13 15 22 18   SpO2: 97% 99% 98% 100%   General: Alert, oriented, no distress Lungs: Clear Back: No CVA tenderness Heart: Regular; no extra sounds or murmurs Abdomen: Bowel sounds present, soft; moderate upper abdominal tenderness, right upper quadrant and epigastric Extremities: No edema  Lab results:   Basic Metabolic Panel:  Recent Labs  12/12/13 0520 12/14/13 0645  NA 138 141  K 3.4* 3.3*  CL 98 103  CO2 24 22  GLUCOSE 113* 113*  BUN 18 22  CREATININE 0.82 0.92  CALCIUM 9.2 8.4    Liver Function Tests:  Recent Labs  12/12/13 0520 12/14/13 0645  AST 97* 81*  ALT 45 40  ALKPHOS 89 115  BILITOT 1.1 0.4  PROT 7.5 6.5  ALBUMIN 3.5 3.2*    Recent Labs  12/12/13 0520 12/14/13 0645  LIPASE 42 67*     CBC:  Recent Labs  12/12/13 0520 12/14/13 0645  WBC 4.4 3.5*  HGB 15.8 14.5  HCT 43.3 40.0  MCV 91.9 92.2  PLT 131* 126*    Coagulation:  Recent Labs   12/12/13 0520 12/14/13 0645  INR 1.15 1.20    Urinalysis    Component Value Date/Time   COLORURINE ORANGE* 12/12/2013 0848   APPEARANCEUR CLEAR 12/12/2013 0848   LABSPEC >1.046* 12/12/2013 0848   PHURINE 6.0 12/12/2013 0848   GLUCOSEU NEGATIVE 12/12/2013 0848   HGBUR NEGATIVE 12/12/2013 0848   BILIRUBINUR MODERATE* 12/12/2013 0848   KETONESUR 15* 12/12/2013 0848   PROTEINUR 30* 12/12/2013 0848   UROBILINOGEN 1.0 12/12/2013 0848   NITRITE POSITIVE* 12/12/2013 0848   LEUKOCYTESUR TRACE* 12/12/2013 0848    Urine microscopic:  Recent Labs  12/12/13 0848  EPIU RARE  WBCU 0-2  RBCU 3-6  BACTERIA RARE  LABCAST HYALINE CASTS*  OTHERU MUCOUS PRESENT    Imaging results:   Dg Chest 2 View  12/12/2013   CLINICAL DATA:  Chest and abdominal pain.  EXAM: CHEST  2 VIEW  COMPARISON:  DG CHEST 1V PORT dated 06/06/2012  FINDINGS: Cardiomediastinal silhouette is unremarkable. The lungs are clear without pleural effusions or focal consolidations. Trachea projects midline and there is no pneumothorax. Soft tissue planes and included osseous structures are non-suspicious. Interval removal of PICC line.  IMPRESSION: No acute cardiopulmonary process.   Electronically Signed   By: Elon Alas   On: 12/12/2013 06:05   US Abdomen Complete  12/12/2013   CLINICAL DATA:  Pain.  EXAM: ULTRASOUND ABDOMEN COMPLETE  COMPARISON:  CT ABD/PELVIS  W CM dated 12/12/2013  FINDINGS: Gallbladder:  Gallstones and sludge noted. Gallbladder wall is thickened to 4 mm. Cholecystitis cannot be excluded. Negative Murphy's sign.  Common bile duct:  Diameter: 6 mm.  Liver:  Liver is echodense. Fatty infiltration could present in this fashion. Hepatocellular disease could present in this fashion.  IVC:  No abnormality visualized.  Pancreas:  Visualized portion unremarkable.  Spleen:  Size and appearance within normal limits.  Right Kidney:  Length: 9.6 cm. Echogenicity within normal limits. No mass or hydronephrosis visualized.  Left  Kidney:  Length: 9.8 cm. Echogenicity within normal limits. No mass or hydronephrosis visualized.  Abdominal aorta:  No aneurysm visualized.  Other findings:  None.  IMPRESSION: 1. Gallstones and sludge. Gallbladder wall is thickened. Cholecystitis cannot be excluded. No biliary obstruction. 2. Echodense liver suggesting fatty infiltration and/or hepatocellular disease.   Electronically Signed   By: Marcello Moores  Register   On: 12/12/2013 10:35   Ct Abdomen Pelvis W Contrast  12/12/2013   CLINICAL DATA:  History of hepatitis-C and cirrhosis now complaining of right-sided abdominal pain.  EXAM: CT ABDOMEN AND PELVIS WITH CONTRAST  TECHNIQUE: Multidetector CT imaging of the abdomen and pelvis was performed using the standard protocol following bolus administration of intravenous contrast.  CONTRAST:  78mL OMNIPAQUE IOHEXOL 300 MG/ML SOLN intravenously ; the patient also received oral contrast material  COMPARISON:  US ABDOMEN COMPLETE dated 06/06/2012  FINDINGS: The liver demonstrates heterogeneous low density consistent with known cirrhosis and likely an element of fatty infiltration. Its surface is nodular. No intrahepatic ductal dilation is demonstrated. A subcentimeter hypodensity in the right lobe is most compatible with a cyst. Increased venous collaterals in the porta hepatis and splenic hilum are not demonstrated.  The gallbladder is adequately distended. There is mild wall thickening and pericholecystic fluid. Multiple calcified stones are present in the fundus. The pancreas exhibits no focal mass. There is no peripancreatic inflammatory change. The spleen is not enlarged. The stomach is nondistended. There is a small hiatal hernia. The duodenum exhibits no inflammatory changes. The adrenal glands and kidneys are normal in appearance. The abdominal aorta exhibits mild failure to taper but no evidence of an aneurysm. There is no periaortic or pericaval lymphadenopathy.  The orally administered contrast has  traversed the normal appearing small bowel and reached as far distally as the splenic flexure. There is no evidence of a small or large bowel obstruction. There are no findings to suggest colitis or enteritis. The appendix is not discretely demonstrated. There is no pericecal inflammatory change. Within the pelvis there is no free fluid. The urinary bladder, prostate gland, and seminal vesicles are normal in appearance. There is no inguinal nor significant umbilical hernia.  The lumbar vertebral bodies are preserved in height. Degenerative disc change is noted at multiple levels. The bony pelvis exhibits no acute abnormalities. The lung bases exhibit no acute abnormalities.  IMPRESSION: 1. The appearance of the gallbladder is worrisome for acute cholecystitis with cholelithiasis. Gallbladder ultrasound may be useful. 2. There are changes within the liver consistent with known cirrhosis. No suspicious masses are demonstrated. No portosystemic venous collaterals are demonstrated. 3. There is no acute bowel abnormality nor acute urinary tract abnormality.   Electronically Signed   By: David  Martinique   On: 12/12/2013 08:48    Nm Hepatobiliary Liver Func  12/14/2013   CLINICAL DATA:  Upper abdominal pain  EXAM: NUCLEAR MEDICINE HEPATOBILIARY IMAGING  Views: Anterior, right lateral right upper quadrant  Radionuclide: Technetium 74m Choletec  Dose:  5.0 mCi  Route of administration: Intravenous  COMPARISON:  None.  FINDINGS: Liver uptake of radiotracer is within normal limits. There is prompt visualization of gallbladder and small bowel, indicating patency of the cystic and common bile ducts.  IMPRESSION: Study within normal limits. Cystic and common bile ducts are patent.   Electronically Signed   By: Lowella Grip M.D.   On: 12/14/2013 16:20    Other results: EKG: Tachycardia, rate 153; right bundle branch block; left anterior fascicular block   Assessment & Plan by Problem:   1.  Upper abdominal pain.   Patient presents with five-day history of upper abdominal pain with some associated nausea and vomiting which have now resolved.  The cause of his pain is not clear.  He has gallstones by CT of abdomen and ultrasound, and the question of cholecystitis was raised by thickened gallbladder wall, and a general surgery consult was obtained.  However, his HIDA scan was normal which rules out acute cholecystitis.  Although he drinks alcohol on a daily basis by his report, his liver enzymes do not suggest acute alcoholic hepatitis.  His lipase is mildly elevated, and there may be a component of pancreatitis.  Other possibilities include gastritis or other upper GI problem.  Plan is start PPI; pain control; supportive care; if his pain persists, GI consult for possible EGD.    2.  Tachycardia.  Patient was tachycardic with rates in the 150s in the ED, and EKG raises a question of a supraventricular tachycardia or atrial flutter.  He is now in sinus rhythm.  The plan is monitor; try to obtain outside records; further workup if tachycardia recurs  3.  Alcohol abuse.  Patient reports daily alcohol consumption of at least one fifth of wine per day.  He reports that he has been in rehabilitation in the past, and he says that he understands that he needs to quit drinking.  Plan is thiamine/folate; CIWA protocol; counseled regarding the importance of abstinence from alcohol.  4.  Other problems and plans as per the resident physician's note.

## 2013-12-14 NOTE — Consult Note (Signed)
Willey Due 11/23/1940  [id number].   Primary Care MD: none Requesting MD: Dr. Marinda Elk Chief Complaint/Reason for Consult: abdominal pain HPI: This is a 73 yo black male who has a hx of Hep C and cirrhosis who began having nausea and vomiting on Friday, last week.  This stopped on Monday.  He has had no other nausea since then.  He then developed some diarrhea on Sunday.  He has had a couple of more episodes since then, but not consistently.  He started having LUQ abdominal pain on Friday and it eventually migrated throughout his entire abdomen.  It is worse now in the RUQ, but still diffusely hurts.  He has been eating and states eating does not make his pain worse.  He admits to reflux and fas pains.  He states he had some left sided chest pain yesterday.  He had palpitations in the ED today, at which time, he was noted to have a HR of 157 and a wide QRS complex and RBBB.  The patient did come to the ED several days ago with a work up suggestive of gallstones and possibly some gb wall thickening.  The patient had business to attend to and left.  He represented today.  He has a normal WBC and normal LFTs from 2 days ago.  LFTs are pending today, but WBC still normal.  We have been asked to see him for further evaluation.  ROS: Please see HPI, otherwise all other systems are negative  Family History  Problem Relation Age of Onset  . Stroke Father   . Prostate cancer Brother     Past Medical History  Diagnosis Date  . Neuropathy   . Hypertension   . Hepatitis C   . 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 Alcohol abuse     Past Surgical History  Procedure Laterality Date  . Esophagogastroduodenoscopy  06/07/2012    Procedure: ESOPHAGOGASTRODUODENOSCOPY (EGD);  Surgeon: Milus Banister, MD;  Location: [facility];  Service: Endoscopy;  Laterality: N/A;  may need treatment of varices    Social History:  reports that he has quit smoking. He does not have any smokeless tobacco history on file. He reports  that he drinks alcohol. He reports that he does not use illicit drugs.  Allergies: No Known Allergies  Medications Prior to Admission  Medication Sig Dispense Refill  . HYDROcodone-acetaminophen (NORCO/VICODIN) 5-325 MG per tablet Take 2 tablets by mouth every 4 (four) hours as needed.  10 tablet  0  . omeprazole (PRILOSEC) 20 MG capsule Take 1 capsule (20 mg total) by mouth 2 (two) times daily.  60 capsule  0  . ondansetron (ZOFRAN ODT) 4 MG disintegrating tablet Take 1 tablet (4 mg total) by mouth every 8 (eight) hours as needed for nausea.  6 tablet  0    Blood pressure 106/71, pulse 75, temperature 98 F (36.7 C), temperature source Oral, resp. rate 15, SpO2 99.00%. Physical Exam: General: WD, WN black male who is laying in bed in NAD HEENT: head is normocephalic, atraumatic.  Sclera are noninjected.  PERRL.  Ears and nose without any masses or lesions.  Mouth is pink and moist Heart: regular, rate, and rhythm.  Normal s1,s2. No obvious murmurs, gallops, or rubs noted.  Palpable radial and pedal pulses bilaterally Lungs: CTAB, no wheezes, rhonchi, or rales noted.  Respiratory effort nonlabored Abd: soft, diffusely tender, but more so in the RUQ, ND, +BS, no masses, hernias, or organomegaly MS: all 4 extremities are symmetrical with no  cyanosis, clubbing, or edema. Skin: warm and dry with no masses, lesions, or rashes Psych: A&Ox3 with an appropriate affect.    Results for orders placed during the hospital encounter of 12/14/13 (from the past 48 hour(s))  CBC     Status: Abnormal   Collection Time    12/14/13  6:45 AM      Result Value Ref Range   WBC 3.5 (*) 4.0 - 10.5 K/uL   RBC 4.34  4.22 - 5.81 MIL/uL   Hemoglobin 14.5  13.0 - 17.0 g/dL   HCT 40.0  39.0 - 52.0 %   MCV 92.2  78.0 - 100.0 fL   MCH 33.4  26.0 - 34.0 pg   MCHC 36.3 (*) 30.0 - 36.0 g/dL   RDW 15.0  11.5 - 15.5 %   Platelets 126 (*) 150 - 400 K/uL  BASIC METABOLIC PANEL     Status: Abnormal   Collection Time     12/14/13  6:45 AM      Result Value Ref Range   Sodium 141  137 - 147 mEq/L   Potassium 3.3 (*) 3.7 - 5.3 mEq/L   Chloride 103  96 - 112 mEq/L   CO2 22  19 - 32 mEq/L   Glucose, Bld 113 (*) 70 - 99 mg/dL   BUN 22  6 - 23 mg/dL   Creatinine, Ser 0.92  0.50 - 1.35 mg/dL   Calcium 8.4  8.4 - 10.5 mg/dL   GFR calc non Af Amer 82 (*) >90 mL/min   GFR calc Af Amer >90  >90 mL/min   Comment: (NOTE)     The eGFR has been calculated using the CKD EPI equation.     This calculation has not been validated in all clinical situations.     eGFR's persistently <90 mL/min signify possible Chronic Kidney     Disease.  PROTIME-INR     Status: None   Collection Time    12/14/13  6:45 AM      Result Value Ref Range   Prothrombin Time 14.9  11.6 - 15.2 seconds   INR 1.20  0.00 - 1.49  I-STAT TROPOININ, ED     Status: None   Collection Time    12/14/13  6:53 AM      Result Value Ref Range   Troponin i, poc 0.01  0.00 - 0.08 ng/mL   Comment 3            Comment: Due to the release kinetics of cTnI,     a negative result within the first hours     of the onset of symptoms does not rule out     myocardial infarction with certainty.     If myocardial infarction is still suspected,     repeat the test at appropriate intervals.  I-STAT CG4 LACTIC ACID, ED     Status: None   Collection Time    12/14/13  6:55 AM      Result Value Ref Range   Lactic Acid, Venous 2.16  0.5 - 2.2 mmol/L   No results found.     Assessment/Plan 1. Abdominal pain 2. Cholelithiasis 3. Hepatitis C 4. Cirrhosis 5. Tachycardia with wide complex QRS and RBBB, converted to NSR  Plan: 1. The patient's store is not 100% consistent with cholecystitis.  He has no WBC or fever.  He does have gallstones and mild wall thickening.  However, he does have cirrhosis which can sometimes this the appearance of cholecystitis  on CT and ultrasound.  To better determine whether his symptoms are truly biliary in nature we will obtain a  HIDA scan.  If this is negative, he may need a GI evaluation.  If this is positive, then we would consider a lap chole with the understanding he is at higher risk for complications and bleeding given his cirrhosis, but the patient tells [place] "ain't nobody cuttin' on me."  We will follow up on HIDA scan.  , E 12/14/2013, 11:53 AM Pager: [phone]

## 2013-12-14 NOTE — ED Notes (Signed)
Lactic Acid reported to [facility]

## 2013-12-14 NOTE — ED Notes (Signed)
Patient is resting comfortably. 

## 2013-12-14 NOTE — H&P (Signed)
Date: 12/14/2013               Patient Name:  Nathan Frank MRN: [id number]  DOB: [birth year] Age / Sex: 73 y.o., male   [facility]         Medical Service: Internal Medicine Teaching Service         Attending Physician: Dr. Axel Filler, MD    First Contact: Dr. Stann Mainland Pager: [phone]  Second Contact: Dr. Algis Liming Pager: [phone]       After Hours (After 5p/  First Contact Pager: [phone]  weekends / holidays): Second Contact Pager: [phone]   Chief Complaint: abdominal pain  History of Present Illness: The patient is a 73 yo man, history of cirrhosis, HCV, alcohol abuse, HTN, presenting with abdominal pain.  The patient notes a 5-day history of RUQ abdominal pain, described as a constant, sharp pain, without radiation, not worsened by eating.  6 days ago, the patient notes eating a bowel of soup that "tasted funny", followed by the development of nausea, non-bloody vomiting, and 3-4 loose stools/day with no BRB or Melena.  The nausea and vomiting resolved 4 days ago, though the patient continues to note 2-3 loose stools/day.  The patient notes no fevers, chills, abdominal distension, or chest pain, though he does note some palpitations on the morning of admission.  The patient was seen in the ED 2 days prior to admission, and CT abdomen showed cholelithiasis with suspicion for cholecystitis, though the patient refused admission at that time.  The patient presented today because the pain had not improved.  Of note, on arrival the patient's HR was in the 150's, then abruptly transitioned to the 80's after IV fluids, with resolution of the patient's palpitations.  Meds: Current Facility-Administered Medications  Medication Dose Route Frequency Provider Last Rate Last Dose  . LORazepam (ATIVAN) injection 0-4 mg  0-4 mg Intravenous 4 times per day Carlisle Cater, PA-C      . LORazepam (ATIVAN) injection 0-4 mg  0-4 mg Intravenous Q12H Carlisle Cater, PA-C      . LORazepam (ATIVAN) tablet 0-4  mg  0-4 mg Oral 4 times per day Carlisle Cater, PA-C      . LORazepam (ATIVAN) tablet 0-4 mg  0-4 mg Oral Q12H Carlisle Cater, PA-C      . thiamine (B-1) injection 100 mg  100 mg Intravenous Daily Carlisle Cater, PA-C      . thiamine (VITAMIN B-1) tablet 100 mg  100 mg Oral Daily Carlisle Cater, PA-C       Current Outpatient Prescriptions  Medication Sig Dispense Refill  . HYDROcodone-acetaminophen (NORCO/VICODIN) 5-325 MG per tablet Take 2 tablets by mouth every 4 (four) hours as needed.  10 tablet  0  . omeprazole (PRILOSEC) 20 MG capsule Take 1 capsule (20 mg total) by mouth 2 (two) times daily.  60 capsule  0  . ondansetron (ZOFRAN ODT) 4 MG disintegrating tablet Take 1 tablet (4 mg total) by mouth every 8 (eight) hours as needed for nausea.  6 tablet  0    Allergies: Allergies as of 12/14/2013  . (No Known Allergies)   Past Medical History  Diagnosis Date  . Neuropathy   . Hypertension   . Hepatitis C   . 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 Alcohol abuse    Past Surgical History  Procedure Laterality Date  . Esophagogastroduodenoscopy  06/07/2012    Procedure: ESOPHAGOGASTRODUODENOSCOPY (EGD);  Surgeon: Milus Banister, MD;  Location: [facility];  Service: Endoscopy;  Laterality: N/A;  may need treatment of varices   Family History  Problem Relation Age of Onset  . Stroke Father   . Prostate cancer Brother    History   Social History  . Marital Status: Single    Spouse Name: N/A    Number of Children: N/A  . Years of Education: N/A   Occupational History  . Not on file.   Social History Main Topics  . Smoking status: Never Smoker   . Smokeless tobacco: Not on file  . Alcohol Use: Yes     Comment: half a pint of vine a day  . Drug Use: No  . Sexual Activity:    Other Topics Concern  . Not on file   Social History Narrative  . No narrative on file    Review of Systems: General: no fevers, chills Skin: no rash HEENT: no blurry vision, hearing changes, sore  throat Pulm: no dyspnea, coughing, wheezing CV: no chest pain, shortness of breath Abd: no abdominal pain, nausea/vomiting, diarrhea/constipation GU: no dysuria, hematuria, polyuria Ext: no arthralgias, myalgias Neuro: no weakness, numbness, or tingling   Physical Exam: Blood pressure 114/77, pulse 82, temperature 98 F (36.7 C), temperature source Oral, resp. rate 13, SpO2 97.00%. General: alert, cooperative, and in no apparent distress HEENT: pupils equal round and reactive to light, vision grossly intact, oropharynx clear and non-erythematous  Neck: supple Lungs: clear to ascultation bilaterally, normal work of respiration, no wheezes, rales, ronchi Heart: regular rate and rhythm, no murmurs, gallops, or rubs Abdomen: soft, mild-moderate RUQ tenderness with +Murphey's sign, non-distended, no ascites appreciated, normoactive bowel sounds, no guarding or rebound tenderness Extremities: no cyanosis, clubbing, or edema Neurologic: alert & oriented X3, cranial nerves II-XII intact, strength grossly intact, sensation intact to light touch   Lab results: Basic Metabolic Panel:  Recent Labs  12/12/13 0520 12/14/13 0645  NA 138 141  K 3.4* 3.3*  CL 98 103  CO2 24 22  GLUCOSE 113* 113*  BUN 18 22  CREATININE 0.82 0.92  CALCIUM 9.2 8.4   Liver Function Tests:  Recent Labs  12/12/13 0520  AST 97*  ALT 45  ALKPHOS 89  BILITOT 1.1  PROT 7.5  ALBUMIN 3.5    Recent Labs  12/12/13 0520  LIPASE 42   CBC:  Recent Labs  12/12/13 0520 12/14/13 0645  WBC 4.4 3.5*  HGB 15.8 14.5  HCT 43.3 40.0  MCV 91.9 92.2  PLT 131* 126*   Coagulation:  Recent Labs  12/12/13 0520 12/14/13 0645  LABPROT 14.5 14.9  INR 1.15 1.20   Urinalysis:  Recent Labs  12/12/13 0848  COLORURINE ORANGE*  LABSPEC >1.046*  PHURINE 6.0  GLUCOSEU NEGATIVE  HGBUR NEGATIVE  BILIRUBINUR MODERATE*  KETONESUR 15*  PROTEINUR 30*  UROBILINOGEN 1.0  NITRITE POSITIVE*  LEUKOCYTESUR TRACE*     Imaging results:  US Abdomen Complete  12/12/2013   CLINICAL DATA:  Pain.  EXAM: ULTRASOUND ABDOMEN COMPLETE  COMPARISON:  CT ABD/PELVIS W CM dated 12/12/2013  FINDINGS: Gallbladder:  Gallstones and sludge noted. Gallbladder wall is thickened to 4 mm. Cholecystitis cannot be excluded. Negative Murphy's sign.  Common bile duct:  Diameter: 6 mm.  Liver:  Liver is echodense. Fatty infiltration could present in this fashion. Hepatocellular disease could present in this fashion.  IVC:  No abnormality visualized.  Pancreas:  Visualized portion unremarkable.  Spleen:  Size and appearance within normal limits.  Right Kidney:  Length: 9.6 cm. Echogenicity within normal limits. No  mass or hydronephrosis visualized.  Left Kidney:  Length: 9.8 cm. Echogenicity within normal limits. No mass or hydronephrosis visualized.  Abdominal aorta:  No aneurysm visualized.  Other findings:  None.  IMPRESSION: 1. Gallstones and sludge. Gallbladder wall is thickened. Cholecystitis cannot be excluded. No biliary obstruction. 2. Echodense liver suggesting fatty infiltration and/or hepatocellular disease.   Electronically Signed   By: Marcello Moores  Register   On: 12/12/2013 10:35    Other results: EKG: tachycardia, appears regular, borderline widened QRS, wandering baseline with ?mild ST depression in lead V2  Assessment & Plan by Problem:  # Abdominal pain - The patient presents with RUQ pain, with CT showing cholelithiasis and suspicion for cholecystitis, making cholecystitis vs symptomatic cholelithiasis the most likely cause of the patient's pain.  No evidence of pancreatitis (negative CT, negative lipase) vs SBP (no significant ascites on imaging or PEX, no fever or hypotension).  CT negative for other intra-abdominal pathology. -surgery consulted by EDP -per surgery recs, HIDA scan ordered (pt cannot have pain medications for 6 hours preceding HIDA) -restart morphine prn after HIDA -IVF -zofran prn  # Tachycardia - The  patient presented with tachycardia to the 150's, with abrupt transition to the 80's, along with symptoms of palpitations.  The patient notes similar palpitations 2 years ago, but none since that time.  The patient has no known history of afib. -repeat EKG now -monitor on telemetry  # Alcohol abuse - last drink 3/11.  The patient does note a history of withdrawal symptoms, but no seizures in the past. -CIWA  # Elevated AG without acidosis - The patient presents with an AG = 16, unchanged from BMET two days ago, with a normal bicarb = 22.  Lactic acid and glucose are normal, though ketones are seen on UA two days ago.  This likely represents alcoholic ketoacidosis. -D5NS at 100 cc/hr  # Prophy  - SCD's for now pending surgery.  Dispo: Disposition is deferred at this time, awaiting improvement of current medical problems. Anticipated discharge in approximately 1-2 day(s).   The patient does have a current PCP (Pcp Not In System) and does not need an [facility] hospital follow-up appointment after discharge.  Signed: Hester Mates, MD 12/14/2013, 9:57 AM

## 2013-12-14 NOTE — Consult Note (Signed)
I have seen and examined the patient and agree with the assessment and plans. Will check a HIDA scan, but he currently refuses any surgery.  Will try to reason with him later  [facility] A. Ninfa Linden  MD, FACS

## 2013-12-14 NOTE — ED Provider Notes (Signed)
CSN: [id number]     Arrival date & time 12/14/13  [id number] History   First MD Initiated Contact with Patient 12/14/13 [id number]     Chief Complaint  Patient presents with  . Abdominal Pain  . Chest Pain     (Consider location/radiation/quality/duration/timing/severity/associated sxs/prior Treatment) HPI Comments: Patient with history of hepatitis C and cirrhosis presents with complaint of right upper quadrant pain. This pain began 4-5 days ago, is worse with palpation and movement, is nonradiating. Patient had vomiting 3 days ago but has been able to tolerate fluids and orals since that time. Patient was seen in emergency department on 12/12/2013. He had CT and ultrasound which could not rule out cholecystitis. Unfortunately, the patient had business to attend to at home and refused admission. Patient returns today with symptoms that are not improved. He denies fever. The onset of this condition was acute. The course is constant. Alleviating factors: none.    Patient is a 73 y.o. male presenting with abdominal pain and chest pain. The history is provided by the patient and medical records.  Abdominal Pain Associated symptoms: no chest pain, no cough, no diarrhea, no dysuria, no fever, no nausea, no shortness of breath, no sore throat and no vomiting   Chest Pain Associated symptoms: abdominal pain   Associated symptoms: no cough, no fever, no headache, no nausea, no shortness of breath and not vomiting     Past Medical History  Diagnosis Date  . Neuropathy   . Hypertension   . Hepatitis C   . Cirrhosis 06/04/2012   Past Surgical History  Procedure Laterality Date  . Esophagogastroduodenoscopy  06/07/2012    Procedure: ESOPHAGOGASTRODUODENOSCOPY (EGD);  Surgeon: Milus Banister, MD;  Location: [facility];  Service: Endoscopy;  Laterality: N/A;  may need treatment of varices   Family History  Problem Relation Age of Onset  . Stroke Father   . Prostate cancer Brother    History  Substance  Use Topics  . Smoking status: Never Smoker   . Smokeless tobacco: Not on file  . Alcohol Use: Yes     Comment: half a pint of vine a day    Review of Systems  Constitutional: Negative for fever.  HENT: Negative for rhinorrhea and sore throat.   Eyes: Negative for redness.  Respiratory: Negative for cough and shortness of breath.   Cardiovascular: Negative for chest pain.  Gastrointestinal: Positive for abdominal pain. Negative for nausea, vomiting and diarrhea.  Genitourinary: Negative for dysuria.  Musculoskeletal: Negative for myalgias.  Skin: Negative for rash.  Neurological: Negative for headaches.   Allergies  Review of patient's allergies indicates no known allergies.  Home Medications   Current Outpatient Rx  Name  Route  Sig  Dispense  Refill  . diazepam (VALIUM) 5 MG tablet   Oral   Take 1 tablet (5 mg total) by mouth every 6 (six) hours as needed for muscle spasms (spasms).   10 tablet   0   . EXPIRED: gabapentin (NEURONTIN) 100 MG capsule   Oral   Take 1 capsule (100 mg total) by mouth 3 (three) times daily.         Marland Kitchen HYDROcodone-acetaminophen (NORCO/VICODIN) 5-325 MG per tablet   Oral   Take 2 tablets by mouth every 4 (four) hours as needed.   10 tablet   0   . ibuprofen (ADVIL,MOTRIN) 800 MG tablet   Oral   Take 1 tablet (800 mg total) by mouth 3 (three) times daily.   21  tablet   0   . Multiple Vitamin (MULTIVITAMIN WITH MINERALS) TABS   Oral   Take 1 tablet by mouth daily.         Marland Kitchen omeprazole (PRILOSEC) 20 MG capsule   Oral   Take 1 capsule (20 mg total) by mouth 2 (two) times daily.   60 capsule   0   . ondansetron (ZOFRAN ODT) 4 MG disintegrating tablet   Oral   Take 1 tablet (4 mg total) by mouth every 8 (eight) hours as needed for nausea.   6 tablet   0   . oxyCODONE-acetaminophen (PERCOCET) 5-325 MG per tablet   Oral   Take 2 tablets by mouth every 6 (six) hours as needed.   10 tablet   0   . EXPIRED: potassium chloride SA  (K-DUR,KLOR-CON) 20 MEQ tablet   Oral   Take 1 tablet (20 mEq total) by mouth daily.         . traZODone (DESYREL) 100 MG tablet   Oral   Take 100 mg by mouth at bedtime.         Marland Kitchen EXPIRED: warfarin (COUMADIN) 5 MG tablet   Oral   Take 1 tablet (5 mg total) by mouth one time only at 6 PM.           Check INR tomorrow and keep it between 2 to 3.    BP 126/89  Pulse 157  Temp(Src) 98 F (36.7 C) (Oral)  Resp 18  SpO2 100%  Physical Exam  Nursing note and vitals reviewed. Constitutional: He appears well-developed and well-nourished.  HENT:  Head: Normocephalic and atraumatic.  Eyes: Conjunctivae are normal. Right eye exhibits no discharge. Left eye exhibits no discharge.  Neck: Normal range of motion. Neck supple.  Cardiovascular: Regular rhythm and normal heart sounds.  Tachycardia present.   Pulses:      Radial pulses are 2+ on the right side, and 2+ on the left side.  Pulmonary/Chest: Effort normal and breath sounds normal.  Abdominal: Soft. Bowel sounds are decreased. There is tenderness in the right upper quadrant, epigastric area and periumbilical area. There is no rigidity, no rebound, no guarding, no CVA tenderness and no tenderness at McBurney's point.  Neurological: He is alert.  Skin: Skin is warm and dry.  Psychiatric: He has a normal mood and affect.    ED Course  Procedures (including critical care time) Labs Review Labs Reviewed  CBC - Abnormal; Notable for the following:    WBC 3.5 (*)    MCHC 36.3 (*)    Platelets 126 (*)    All other components within normal limits  BASIC METABOLIC PANEL - Abnormal; Notable for the following:    Potassium 3.3 (*)    Glucose, Bld 113 (*)    GFR calc non Af Amer 82 (*)    All other components within normal limits  PROTIME-INR  HEPATIC FUNCTION PANEL  LIPASE, BLOOD  I-STAT TROPOININ, ED  I-STAT CG4 LACTIC ACID, ED   Imaging Review US Abdomen Complete  12/12/2013   CLINICAL DATA:  Pain.  EXAM: ULTRASOUND  ABDOMEN COMPLETE  COMPARISON:  CT ABD/PELVIS W CM dated 12/12/2013  FINDINGS: Gallbladder:  Gallstones and sludge noted. Gallbladder wall is thickened to 4 mm. Cholecystitis cannot be excluded. Negative Murphy's sign.  Common bile duct:  Diameter: 6 mm.  Liver:  Liver is echodense. Fatty infiltration could present in this fashion. Hepatocellular disease could present in this fashion.  IVC:  No abnormality visualized.  Pancreas:  Visualized portion unremarkable.  Spleen:  Size and appearance within normal limits.  Right Kidney:  Length: 9.6 cm. Echogenicity within normal limits. No mass or hydronephrosis visualized.  Left Kidney:  Length: 9.8 cm. Echogenicity within normal limits. No mass or hydronephrosis visualized.  Abdominal aorta:  No aneurysm visualized.  Other findings:  None.  IMPRESSION: 1. Gallstones and sludge. Gallbladder wall is thickened. Cholecystitis cannot be excluded. No biliary obstruction. 2. Echodense liver suggesting fatty infiltration and/or hepatocellular disease.   Electronically Signed   By: Marcello Moores  Register   On: 12/12/2013 10:35   Ct Abdomen Pelvis W Contrast  12/12/2013   CLINICAL DATA:  History of hepatitis-C and cirrhosis now complaining of right-sided abdominal pain.  EXAM: CT ABDOMEN AND PELVIS WITH CONTRAST  TECHNIQUE: Multidetector CT imaging of the abdomen and pelvis was performed using the standard protocol following bolus administration of intravenous contrast.  CONTRAST:  73mL OMNIPAQUE IOHEXOL 300 MG/ML SOLN intravenously ; the patient also received oral contrast material  COMPARISON:  US ABDOMEN COMPLETE dated 06/06/2012  FINDINGS: The liver demonstrates heterogeneous low density consistent with known cirrhosis and likely an element of fatty infiltration. Its surface is nodular. No intrahepatic ductal dilation is demonstrated. A subcentimeter hypodensity in the right lobe is most compatible with a cyst. Increased venous collaterals in the porta hepatis and splenic hilum are  not demonstrated.  The gallbladder is adequately distended. There is mild wall thickening and pericholecystic fluid. Multiple calcified stones are present in the fundus. The pancreas exhibits no focal mass. There is no peripancreatic inflammatory change. The spleen is not enlarged. The stomach is nondistended. There is a small hiatal hernia. The duodenum exhibits no inflammatory changes. The adrenal glands and kidneys are normal in appearance. The abdominal aorta exhibits mild failure to taper but no evidence of an aneurysm. There is no periaortic or pericaval lymphadenopathy.  The orally administered contrast has traversed the normal appearing small bowel and reached as far distally as the splenic flexure. There is no evidence of a small or large bowel obstruction. There are no findings to suggest colitis or enteritis. The appendix is not discretely demonstrated. There is no pericecal inflammatory change. Within the pelvis there is no free fluid. The urinary bladder, prostate gland, and seminal vesicles are normal in appearance. There is no inguinal nor significant umbilical hernia.  The lumbar vertebral bodies are preserved in height. Degenerative disc change is noted at multiple levels. The bony pelvis exhibits no acute abnormalities. The lung bases exhibit no acute abnormalities.  IMPRESSION: 1. The appearance of the gallbladder is worrisome for acute cholecystitis with cholelithiasis. Gallbladder ultrasound may be useful. 2. There are changes within the liver consistent with known cirrhosis. No suspicious masses are demonstrated. No portosystemic venous collaterals are demonstrated. 3. There is no acute bowel abnormality nor acute urinary tract abnormality.   Electronically Signed   By: David  Martinique   On: 12/12/2013 08:48     EKG Interpretation   Date/Time:  Thursday December 14 2013 06:31:31 EDT Ventricular Rate:  153 PR Interval:    QRS Duration: 125 QT Interval:  350 QTC Calculation: 558 R Axis:    -42 Text Interpretation:  Wide-QRS tachycardia RBBB and LAFB Baseline wander  in lead(s) V3 No significant change since last tracing Confirmed by WARD,   DO, KRISTEN ([id number]) on 12/14/2013 8:39:10 AM      Patient seen and examined. Work-up initiated. Medications ordered. D/w Dr. Venora Maples. Regarding HR will check response to NS bolus and  pain medicine. Will give IV ativan as well 2/2 h/o alcohol abuse. No known history of afib.   Vital signs reviewed and are as follows: Filed Vitals:   12/14/13 0629  BP: 126/89  Pulse: 157  Temp: 98 F (36.7 C)  Resp: 18   7:53 AM Patient seen by Dr. Venora Maples.   8:19 AM HR now in 80's. EKG is now clearly NSR. ?Afib/aflutter. No previous diagnosis of this in our records. When asked about abnormal heartbeat, patient states he was hospitalized at the [facility] for something similar to this 2 years ago. He is unable to give further details.   8:51 AM HIDA will take > 6 hrs. D/w Dr. Leonides Schanz. Will admit.   9:24 AM IMTS to admit. Tele bed due to concern regarding HR.   10:51 AM Patient to floor. Pt refusing CXR.   MDM   Final diagnoses:  Abdominal pain  Tachycardia   Admit for deliniation of abd pain, heart rate monitoring. Surgery to consult.   Concern for underlying afib/aflutter due to abrupt change in HR from 150 to 80's.     Carlisle Cater, PA-C 12/14/13 [place], PA-C 12/14/13 1051

## 2013-12-15 DIAGNOSIS — K8020 Calculus of gallbladder without cholecystitis without obstruction: Secondary | ICD-10-CM

## 2013-12-15 HISTORY — DX: Calculus of gallbladder without cholecystitis without obstruction: K80.20

## 2013-12-15 LAB — BASIC METABOLIC PANEL
BUN: 21 mg/dL (ref 6–23)
CO2: 24 meq/L (ref 19–32)
Calcium: 8.2 mg/dL — ABNORMAL LOW (ref 8.4–10.5)
Chloride: 109 mEq/L (ref 96–112)
Creatinine, Ser: 0.79 mg/dL (ref 0.50–1.35)
GFR calc Af Amer: >90 mL/min (ref >90)
GFR, EST NON AFRICAN AMERICAN: 87 mL/min — AB (ref >90)
GLUCOSE: 94 mg/dL (ref 70–99)
Potassium: 3.8 mEq/L (ref 3.7–5.3)
SODIUM: 144 meq/L (ref 137–147)

## 2013-12-15 MED ORDER — OMEPRAZOLE 20 MG PO CPDR: 20.0000 mg | DELAYED_RELEASE_CAPSULE | Freq: Every day | ORAL | Status: DC

## 2013-12-15 MED ORDER — MORPHINE SULFATE 2 MG/ML IJ SOLN: 2.0000 mg | INTRAMUSCULAR | Status: DC | PRN

## 2013-12-15 NOTE — Progress Notes (Signed)
Nathan Frank to be D/C'd Home per MD order.  Discussed with the patient and all questions fully answered.    Medication List    STOP taking these medications       HYDROcodone-acetaminophen 5-325 MG per tablet  Commonly known as:  NORCO/VICODIN     ondansetron 4 MG disintegrating tablet  Commonly known as:  ZOFRAN ODT      TAKE these medications       omeprazole 20 MG capsule  Commonly known as:  PRILOSEC  Take 1 capsule (20 mg total) by mouth daily.        VVS, Skin clean, dry and intact without evidence of skin break down, no evidence of skin tears noted. IV catheter discontinued intact. Site without signs and symptoms of complications. Dressing and pressure applied.  An After Visit Summary was printed and given to the patient. Patient escorted via [place], and D/C home via private auto.  Driggers, Allene Pyo 12/15/2013 2:32 PM

## 2013-12-15 NOTE — Progress Notes (Signed)
Patient ID: Nathan Frank, male   DOB: 1941-06-17, 73 y.o.   MRN: [id number]    Subjective: Pt says he feels about the same.  Tolerating a diet with no issues  Objective: Vital signs in last 24 hours: Temp:  [98 F (36.7 C)-98.3 F (36.8 C)] 98.2 F (36.8 C) (03/13 0557) Pulse Rate:  [69-80] 73 (03/13 0557) Resp:  [15-22] 18 (03/13 0557) BP: (106-163)/(71-106) 163/106 mmHg (03/13 0557) SpO2:  [96 %-100 %] 96 % (03/13 0557) Last BM Date: 12/13/13  Intake/Output from previous day: 03/12 0701 - 03/13 0700 In: 533.3 [I.V.:533.3] Out: 100 [Urine:100] Intake/Output this shift:    PE: Abd: soft, seems less tender today, +BS, ND  Lab Results:   Recent Labs  12/14/13 0645  WBC 3.5*  HGB 14.5  HCT 40.0  PLT 126*   BMET  Recent Labs  12/14/13 0645 12/15/13 0724  NA 141 144  K 3.3* 3.8  CL 103 109  CO2 22 24  GLUCOSE 113* 94  BUN 22 21  CREATININE 0.92 0.79  CALCIUM 8.4 8.2*   PT/INR  Recent Labs  12/14/13 0645  LABPROT 14.9  INR 1.20   CMP     Component Value Date/Time   NA 144 12/15/2013 0724   K 3.8 12/15/2013 0724   CL 109 12/15/2013 0724   CO2 24 12/15/2013 0724   GLUCOSE 94 12/15/2013 0724   BUN 21 12/15/2013 0724   CREATININE 0.79 12/15/2013 0724   CALCIUM 8.2* 12/15/2013 0724   PROT 6.5 12/14/2013 0645   ALBUMIN 3.2* 12/14/2013 0645   AST 81* 12/14/2013 0645   ALT 40 12/14/2013 0645   ALKPHOS 115 12/14/2013 0645   BILITOT 0.4 12/14/2013 0645   GFRNONAA 87* 12/15/2013 0724   GFRAA >90 12/15/2013 0724   Lipase     Component Value Date/Time   LIPASE 67* 12/14/2013 0645       Studies/Results: Nm Hepatobiliary Liver Func  12/14/2013   CLINICAL DATA:  Upper abdominal pain  EXAM: NUCLEAR MEDICINE HEPATOBILIARY IMAGING  Views: Anterior, right lateral right upper quadrant  Radionuclide: Technetium 71m Choletec  Dose:  5.0 mCi  Route of administration: Intravenous  COMPARISON:  None.  FINDINGS: Liver uptake of radiotracer is within normal limits. There is  prompt visualization of gallbladder and small bowel, indicating patency of the cystic and common bile ducts.  IMPRESSION: Study within normal limits. Cystic and common bile ducts are patent.   Electronically Signed   By: Lowella Grip M.D.   On: 12/14/2013 16:20    Anti-infectives: Anti-infectives   Start     Dose/Rate Route Frequency Ordered Stop   12/14/13 0800  piperacillin-tazobactam (ZOSYN) IVPB 3.375 g  Status:  Discontinued     3.375 g 12.5 mL/hr over 240 Minutes Intravenous  Once 12/14/13 0747 12/14/13 0751   12/14/13 0800  piperacillin-tazobactam (ZOSYN) IVPB 4.5 g  Status:  Discontinued     4.5 g 200 mL/hr over 30 Minutes Intravenous  Once 12/14/13 0751 12/14/13 0751   12/14/13 0800  piperacillin-tazobactam (ZOSYN) IVPB 3.375 g     3.375 g 100 mL/hr over 30 Minutes Intravenous  Once 12/14/13 0752 12/14/13 1012   12/14/13 0700  piperacillin-tazobactam (ZOSYN) IVPB 4.5 g  Status:  Discontinued     4.5 g 133.3 mL/hr over 30 Minutes Intravenous  Once 12/14/13 0651 12/14/13 0746       Assessment/Plan  1. Abdominal pain 2. Hep C 3. Cirrhosis   Plan: 1. His HIDA scan was negative for  cholecystitis.  He does not need an immediate operation for his gallbladder.  He still has stones that could cause some problems, but his symptoms are not very consistent with this.   2. Agree with medicine's plan to treat with PPI etc and possible GI consult if patient does not improve.  We will sign off.  Call us as needed.   LOS: 1 day    Ranulfo Kall E 12/15/2013, 10:18 AM Pager: [phone]

## 2013-12-15 NOTE — Discharge Summary (Signed)
Name: Nathan Frank MRN: [id number] DOB: 1941/02/21 73 y.o. PCP: Pcp Not In System- Dr. Susie Cassette  Date of Admission: 12/14/2013  6:23 AM Date of Discharge: 12/15/2013 Attending Physician: Farley Ly, MD  Discharge Diagnosis: Principal Problem:   Abdominal pain Active Problems:   Alcohol abuse   Hepatitis C   Cirrhosis   Gastritis   Symptomatic cholelithiasis  Discharge Medications:   Medication List    STOP taking these medications       HYDROcodone-acetaminophen 5-325 MG per tablet  Commonly known as:  NORCO/VICODIN     ondansetron 4 MG disintegrating tablet  Commonly known as:  ZOFRAN ODT      TAKE these medications       omeprazole 20 MG capsule  Commonly known as:  PRILOSEC  Take 1 capsule (20 mg total) by mouth daily.        Disposition and follow-up:   Mr.Trask Yuill was discharged from [facility] in Stable condition.  At the hospital follow up visit please address:  1.  Resolution of abdominal pain, compliance with PPI; if abdominal pain still uncontrolled after 8 week trial of PPI, consider referring to GI (patient has seen Dr. Christella Hartigan in past)  2.  Labs / imaging needed at time of follow-up: EKG  3.  Pending labs/ test needing follow-up: none  Follow-up Appointments: Follow-up Information   Follow up with Richarda Overlie, MD On 12/22/2013. (12pm)    Specialty:  Internal Medicine   Contact information:   [address] [place] [address] [phone]       Discharge Instructions: Discharge Orders   Future Appointments Provider Department Dept Phone   12/22/2013 12:00 PM [facility] Covering Provider [facility] [phone]   Future Orders Complete By Expires   Call MD for:  persistant dizziness or light-headedness  As directed    Call MD for:  persistant nausea and vomiting  As directed    Diet - low sodium heart healthy  As directed    Increase activity slowly  As directed        Consultations:  none  Procedures Performed:  Dg Chest 2 View  12/12/2013   CLINICAL DATA:  Chest and abdominal pain.  EXAM: CHEST  2 VIEW  COMPARISON:  DG CHEST 1V PORT dated 06/06/2012  FINDINGS: Cardiomediastinal silhouette is unremarkable. The lungs are clear without pleural effusions or focal consolidations. Trachea projects midline and there is no pneumothorax. Soft tissue planes and included osseous structures are non-suspicious. Interval removal of PICC line.  IMPRESSION: No acute cardiopulmonary process.   Electronically Signed   By: Awilda Metro   On: 12/12/2013 06:05   Nm Hepatobiliary Liver Func  12/14/2013   CLINICAL DATA:  Upper abdominal pain  EXAM: NUCLEAR MEDICINE HEPATOBILIARY IMAGING  Views: Anterior, right lateral right upper quadrant  Radionuclide: Technetium 42m Choletec  Dose:  5.0 mCi  Route of administration: Intravenous  COMPARISON:  None.  FINDINGS: Liver uptake of radiotracer is within normal limits. There is prompt visualization of gallbladder and small bowel, indicating patency of the cystic and common bile ducts.  IMPRESSION: Study within normal limits. Cystic and common bile ducts are patent.   Electronically Signed   By: Bretta Bang M.D.   On: 12/14/2013 16:20   US Abdomen Complete  12/12/2013   CLINICAL DATA:  Pain.  EXAM: ULTRASOUND ABDOMEN COMPLETE  COMPARISON:  CT ABD/PELVIS W CM dated 12/12/2013  FINDINGS: Gallbladder:  Gallstones and sludge noted. Gallbladder  wall is thickened to 4 mm. Cholecystitis cannot be excluded. Negative Murphy's sign.  Common bile duct:  Diameter: 6 mm.  Liver:  Liver is echodense. Fatty infiltration could present in this fashion. Hepatocellular disease could present in this fashion.  IVC:  No abnormality visualized.  Pancreas:  Visualized portion unremarkable.  Spleen:  Size and appearance within normal limits.  Right Kidney:  Length: 9.6 cm. Echogenicity within normal limits. No mass or hydronephrosis visualized.  Left Kidney:   Length: 9.8 cm. Echogenicity within normal limits. No mass or hydronephrosis visualized.  Abdominal aorta:  No aneurysm visualized.  Other findings:  None.  IMPRESSION: 1. Gallstones and sludge. Gallbladder wall is thickened. Cholecystitis cannot be excluded. No biliary obstruction. 2. Echodense liver suggesting fatty infiltration and/or hepatocellular disease.   Electronically Signed   By: Marcello Moores  Register   On: 12/12/2013 10:35   Ct Abdomen Pelvis W Contrast  12/12/2013   CLINICAL DATA:  History of hepatitis-C and cirrhosis now complaining of right-sided abdominal pain.  EXAM: CT ABDOMEN AND PELVIS WITH CONTRAST  TECHNIQUE: Multidetector CT imaging of the abdomen and pelvis was performed using the standard protocol following bolus administration of intravenous contrast.  CONTRAST:  37mL OMNIPAQUE IOHEXOL 300 MG/ML SOLN intravenously ; the patient also received oral contrast material  COMPARISON:  US ABDOMEN COMPLETE dated 06/06/2012  FINDINGS: The liver demonstrates heterogeneous low density consistent with known cirrhosis and likely an element of fatty infiltration. Its surface is nodular. No intrahepatic ductal dilation is demonstrated. A subcentimeter hypodensity in the right lobe is most compatible with a cyst. Increased venous collaterals in the porta hepatis and splenic hilum are not demonstrated.  The gallbladder is adequately distended. There is mild wall thickening and pericholecystic fluid. Multiple calcified stones are present in the fundus. The pancreas exhibits no focal mass. There is no peripancreatic inflammatory change. The spleen is not enlarged. The stomach is nondistended. There is a small hiatal hernia. The duodenum exhibits no inflammatory changes. The adrenal glands and kidneys are normal in appearance. The abdominal aorta exhibits mild failure to taper but no evidence of an aneurysm. There is no periaortic or pericaval lymphadenopathy.  The orally administered contrast has traversed the  normal appearing small bowel and reached as far distally as the splenic flexure. There is no evidence of a small or large bowel obstruction. There are no findings to suggest colitis or enteritis. The appendix is not discretely demonstrated. There is no pericecal inflammatory change. Within the pelvis there is no free fluid. The urinary bladder, prostate gland, and seminal vesicles are normal in appearance. There is no inguinal nor significant umbilical hernia.  The lumbar vertebral bodies are preserved in height. Degenerative disc change is noted at multiple levels. The bony pelvis exhibits no acute abnormalities. The lung bases exhibit no acute abnormalities.  IMPRESSION: 1. The appearance of the gallbladder is worrisome for acute cholecystitis with cholelithiasis. Gallbladder ultrasound may be useful. 2. There are changes within the liver consistent with known cirrhosis. No suspicious masses are demonstrated. No portosystemic venous collaterals are demonstrated. 3. There is no acute bowel abnormality nor acute urinary tract abnormality.   Electronically Signed   By: David  Martinique   On: 12/12/2013 08:48    Admission HPI:  The patient is a 73 yo man, history of cirrhosis, HCV, alcohol abuse, HTN, presenting with abdominal pain. The patient notes a 5-day history of RUQ abdominal pain, described as a constant, sharp pain, without radiation, not worsened by eating. 6 days ago,  the patient notes eating a bowel of soup that "tasted funny", followed by the development of nausea, non-bloody vomiting, and 3-4 loose stools/day with no BRB or Melena. The nausea and vomiting resolved 4 days ago, though the patient continues to note 2-3 loose stools/day. The patient notes no fevers, chills, abdominal distension, or chest pain, though he does note some palpitations on the morning of admission. The patient was seen in the ED 2 days prior to admission, and CT abdomen showed cholelithiasis with suspicion for cholecystitis,  though the patient refused admission at that time. The patient presented today because the pain had not improved. Of note, on arrival the patient's HR was in the 150's, then abruptly transitioned to the 80's after IV fluids, with resolution of the patient's palpitations.   Hospital Course by problem list: 1. Abdominal pain, likely gastritis vs. symptomatic cholelithiasis- Patient presented with RUQ pain with abdominal CT two days prior showing cholelithiasis and suspicion for cholecystitis, making cholecystitis vs symptomatic cholelithiasis most likely diagnosis. Surgery was consulted in ED, ordered HIDA scan which was negative.  SBP also considered but no significant ascites on imaging or exam, no fever or hypotension.  CT negative for other intra-abdominal pathology.  Started pantoprazole 40 mg daily, continued PPI at discharge.  Morphine prn was restarted after HIDA, patient requested one dose on day of admission, one on day of discharge; did not feel comfortable discharging patient with narcotics given ongoing alcohol abuse.  Encouraged cessation of alcohol use.  Per patient request, set up appt with new local PCP.   2. Elevated lipase- Lipase on admission 67. Little to no concern for pancreatitis given no evidence on imaging, patient eating well without nausea/vomiting or abdominal pain.   3. Tachycardia, resolved- Patient presented with tachycardia to the 150s with palpitations (?atrial flutter on EKG), with abrupt transition to the 80's and resolution of palpitations after NS bolus. Patient has no known history of atrial fibrillation. No events overnight on telemetry.  Continue to monitor outpatient, consider repeat EKG at follow-up appointment.   4. Alcohol abuse- last drink 3/11. Patient does note a history of withdrawal symptoms, but no seizures in the past.  CIWA protocol initiated, patient required two doses of Ativan on day of admission, does not appear to be in withdrawal at this time.  5.  Elevated AG without acidosis, resolved- Likely due to alcoholic ketoacidosis. Patient presented with an AG = 16 (unchanged from BMP two days prior) with normal bicarb --> 11.  Lactic acid and glucose within normal limits.  Patient received D5NS at 100 cc/hr through day of discharge with resolution of anion gap per above.     Discharge Vitals:   BP 163/106  Pulse 73  Temp(Src) 98.2 F (36.8 C) (Oral)  Resp 18  Ht 6' (1.829 m)  Wt 168 lb 6.4 oz (76.386 kg)  BMI 22.83 kg/m2  SpO2 96%  Discharge Labs:  Results for orders placed during the hospital encounter of 12/14/13 (from the past 24 hour(s))  BASIC METABOLIC PANEL     Status: Abnormal   Collection Time    12/15/13  7:24 AM      Result Value Ref Range   Sodium 144  137 - 147 mEq/L   Potassium 3.8  3.7 - 5.3 mEq/L   Chloride 109  96 - 112 mEq/L   CO2 24  19 - 32 mEq/L   Glucose, Bld 94  70 - 99 mg/dL   BUN 21  6 - 23 mg/dL  Creatinine, Ser 0.79  0.50 - 1.35 mg/dL   Calcium 8.2 (*) 8.4 - 10.5 mg/dL   GFR calc non Af Amer 87 (*) >90 mL/min   GFR calc Af Amer >90  >90 mL/min    Signed: Ivin Poot, MD 12/15/2013, 2:05 PM   Time Spent on Discharge: 35 minutes Services Ordered on Discharge: none Equipment Ordered on Discharge: none

## 2013-12-15 NOTE — Progress Notes (Signed)
I have seen and examined the patient and agree with the assessment and plans. He also still refuses any surgery  Maneh Sieben A. Ninfa Linden  MD, FACS

## 2013-12-15 NOTE — Care Management Note (Signed)
    Page 1 of 1   12/15/2013     4:55:17 PM   CARE MANAGEMENT NOTE 12/15/2013  Patient:  Nathan Frank,Nathan Frank   Account Number:  [id number]  Date Initiated:  12/15/2013  Documentation initiated by:  Tomi Bamberger  Subjective/Objective Assessment:   dx abd pain  admit     Action/Plan:   Anticipated DC Date:  12/15/2013   Anticipated DC Plan:  [place]  CM consult      Choice offered to / List presented to:             Status of service:  Completed, signed off Medicare Important Message given?   (If response is "NO", the following Medicare IM given date fields will be blank) Date Medicare IM given:   Date Additional Medicare IM given:    Discharge Disposition:  HOME/SELF CARE  Per UR Regulation:    If discussed at Long Length of Stay Meetings, dates discussed:    Comments:

## 2013-12-15 NOTE — Progress Notes (Signed)
Internal Medicine Attending  Date: 12/15/2013  Patient name: Nathan Frank Medical record number: [id number] Date of birth: 1941-01-08 Age: 73 y.o. Gender: male  I saw and evaluated the patient on A.M rounds, and discussed his care with housestaff.  I reviewed the resident's note by Dr. Stann Mainland and I agree with the resident's findings and plans as documented in her note.

## 2013-12-15 NOTE — Progress Notes (Signed)
Subjective: Nathan Frank is doing better this morning.  Ate his dinner last night and entire breakfast this morning without pain, no nausea/vomiting.  He continues to complain of mild abdominal pain.  Requesting new PCP (used to follow at [facility]), willing to take PPI outpatient. Seems pre-complative about quitting drinking.   Objective: Vital signs in last 24 hours: Filed Vitals:   12/14/13 1411 12/14/13 2350 12/15/13 0557 12/15/13 0900  BP: 117/75 116/81 163/106   Pulse: 69 80 73   Temp: 98 F (36.7 C) 98.3 F (36.8 C) 98.2 F (36.8 C)   TempSrc: Oral Oral Oral   Resp: 18 18 18    Height:    6' (1.829 m)  Weight:    168 lb 6.4 oz (76.386 kg)  SpO2: 100% 97% 96%    Weight change:   Intake/Output Summary (Last 24 hours) at 12/15/13 1345 Last data filed at 12/15/13 0900  Gross per 24 hour  Intake 773.33 ml  Output    100 ml  Net 673.33 ml   PEX General: alert, cooperative, and in no apparent distress HEENT: NCAT, vision grossly intact, oropharynx clear and non-erythematous  Neck: supple Lungs: clear to ascultation bilaterally, normal work of respiration, no wheezes, rales, ronchi Heart: regular rate and rhythm, no murmurs, gallops, or rubs Abdomen: soft, very mild diffuse TTP, non-distended, no ascites appreciated, normoactive bowel sounds, no guarding or rebound Extremities: no cyanosis, clubbing, or edema Neurologic: alert & oriented X3, cranial nerves II-XII intact, strength grossly intact, sensation intact to light touch   Lab Results: Basic Metabolic Panel:  Recent Labs Lab 12/14/13 0645 12/15/13 0724  NA 141 144  K 3.3* 3.8  CL 103 109  CO2 22 24  GLUCOSE 113* 94  BUN 22 21  CREATININE 0.92 0.79  CALCIUM 8.4 8.2*   Liver Function Tests:  Recent Labs Lab 12/12/13 0520 12/14/13 0645  AST 97* 81*  ALT 45 40  ALKPHOS 89 115  BILITOT 1.1 0.4  PROT 7.5 6.5  ALBUMIN 3.5 3.2*    Recent Labs Lab 12/12/13 0520 12/14/13 0645  LIPASE 42 67*    CBC:  Recent Labs Lab 12/12/13 0520 12/14/13 0645  WBC 4.4 3.5*  HGB 15.8 14.5  HCT 43.3 40.0  MCV 91.9 92.2  PLT 131* 126*   Coagulation:  Recent Labs Lab 12/12/13 0520 12/14/13 0645  LABPROT 14.5 14.9  INR 1.15 1.20   Urinalysis:  Recent Labs Lab 12/12/13 0848  COLORURINE ORANGE*  LABSPEC >1.046*  PHURINE 6.0  GLUCOSEU NEGATIVE  HGBUR NEGATIVE  BILIRUBINUR MODERATE*  KETONESUR 15*  PROTEINUR 30*  UROBILINOGEN 1.0  NITRITE POSITIVE*  LEUKOCYTESUR TRACE*    Studies/Results: Nm Hepatobiliary Liver Func  12/14/2013   CLINICAL DATA:  Upper abdominal pain  EXAM: NUCLEAR MEDICINE HEPATOBILIARY IMAGING  Views: Anterior, right lateral right upper quadrant  Radionuclide: Technetium 36m Choletec  Dose:  5.0 mCi  Route of administration: Intravenous  COMPARISON:  None.  FINDINGS: Liver uptake of radiotracer is within normal limits. There is prompt visualization of gallbladder and small bowel, indicating patency of the cystic and common bile ducts.  IMPRESSION: Study within normal limits. Cystic and common bile ducts are patent.   Electronically Signed   By: Lowella Grip M.D.   On: 12/14/2013 16:20   Medications: I have reviewed the patient's current medications. Scheduled Meds: . folic acid  1 mg Oral Daily  . LORazepam  0-4 mg Intravenous Q6H   Followed by  . [START ON 12/16/2013]  LORazepam  0-4 mg Intravenous Q12H  . multivitamin with minerals  1 tablet Oral Daily  . pantoprazole  40 mg Oral Daily  . thiamine  100 mg Oral Daily   Or  . thiamine  100 mg Intravenous Daily   Continuous Infusions: . dextrose 5 % and 0.9% NaCl 100 mL/hr at 12/14/13 2146   PRN Meds:.LORazepam, LORazepam Assessment/Plan: #Abdominal pain, likely gastritis vs. symptomatic cholelithiasis- Patient presented with RUQ pain with abdominal CT two days prior showing cholelithiasis and suspicion for cholecystitis, making cholecystitis vs symptomatic cholelithiasis most likely diagnosis.   Surgery was consulted in ED, ordered HIDA scan which was negative.  SBP also considered but no significant ascites on imaging or exam, no fever or hypotension. CT negative for other intra-abdominal pathology.   -surgery consulted, signed off today given negative HIDA -morphine prn was restarted after HIDA, patient requested one dose yesterday, one today -pantoprazole 40 mg daily, will continue PPI at discharge -encouraged cessation of alcohol use -per patient request, have set up appt with new local PCP  #Elevated lipase- Lipase on admission 67.  Little to no concern for pancreatitis given no evidence on imaging, patient eating well without nausea/vomiting.   #Tachycardia, resolved- Patient presented with tachycardia to the 150s with palpitations (?atrial flutter on EKG), with abrupt transition to the 80's and resolution of palpitations after NS bolus.  Patient has no known history of afib.  No events overnight on telemetry.   -continue to monitor outpatient  #Alcohol abuse- last drink 3/11. Patient does note a history of withdrawal symptoms, but no seizures in the past.  -CIWA- patient required two doses of Ativan yesterday, does not appear to be in withdrawal at this time  #Elevated AG without acidosis, resolved- Likely due to alcoholic ketoacidosis.  Patient presented with an AG = 16 --> 11, unchanged from BMP two days prior, with a normal bicarb = 22.  Lactic acid and glucose within normal limits.  -D5NS at 100 cc/hr through discharge   Dispo:  Anticipated discharge today.   The patient does not have a current PCP (Pcp Not In System) and does need an [facility] hospital follow-up appointment after discharge.   .Services Needed at time of discharge: Y = Yes, Blank = No PT:   OT:   RN:   Equipment:   Other:     LOS: 1 day   Ivin Poot, MD 12/15/2013, 1:45 PM

## 2013-12-15 NOTE — Discharge Instructions (Signed)
Please take your stomach acid medicine called omeprazole every day.  This is very important to help prevent your abdominal pain.   Don't forget your internal medicine follow-up appointment!  Gastritis, Adult Gastritis is soreness and puffiness (inflammation) of the lining of the stomach. If you do not get help, gastritis can cause bleeding and sores (ulcers) in the stomach. HOME CARE   Only take medicine as told by your doctor.  If you were given antibiotic medicines, take them as told. Finish the medicines even if you start to feel better.  Drink enough fluids to keep your pee (urine) clear or pale yellow.  Avoid foods and drinks that make your problems worse. Foods you may want to avoid include:  Caffeine or alcohol.  Chocolate.  Mint.  Garlic and onions.  Spicy foods.  Citrus fruits, including oranges, lemons, or limes.  Food containing tomatoes, including sauce, chili, salsa, and pizza.  Fried and fatty foods.  Eat small meals throughout the day instead of large meals. GET HELP RIGHT AWAY IF:   You have black or dark red poop (stools).  You throw up (vomit) blood. It may look like coffee grounds.  You cannot keep fluids down.  Your belly (abdominal) pain gets worse.  You have a fever.  You do not feel better after 1 week.  You have any other questions or concerns. MAKE SURE YOU:   Understand these instructions.  Will watch your condition.  Will get help right away if you are not doing well or get worse. Document Released: 03/09/2008 Document Revised: 12/14/2011 Document Reviewed: 11/04/2011 [facility] Patient Information 2014 [place].   Alcohol and Nutrition Nutrition serves two purposes. It provides energy. It also maintains body structure and function. Food supplies energy. It also provides the building blocks needed to replace worn or damaged cells. Alcoholics often eat poorly. This limits their supply of essential nutrients. This affects  energy supply and structure maintenance. Alcohol also affects the body's nutrients in:  Digestion.  Storage.  Using and getting rid of waste products. IMPAIRMENT OF NUTRIENT DIGESTION AND UTILIZATION   Once ingested, food must be broken down into small components (digested). Then it is available for energy. It helps maintain body structure and function. Digestion begins in the mouth. It continues in the stomach and intestines, with help from the pancreas. The nutrients from digested food are absorbed from the intestines into the blood. Then they are carried to the liver. The liver prepares nutrients for:  Immediate use.  Storage and future use.  Alcohol inhibits the breakdown of nutrients into usable molecules.  It decreases secretion of digestive enzymes from the pancreas.  Alcohol impairs nutrient absorption by damaging the cells lining the stomach and intestines.  It also interferes with moving some nutrients into the blood.  In addition, nutritional deficiencies themselves may lead to further absorption problems.  For example, folate deficiency changes the cells that line the small intestine. This impairs how water is absorbed. It also affects absorbed nutrients. These include glucose, sodium, and additional folate.  Even if nutrients are digested and absorbed, alcohol can prevent them from being fully used. It changes their transport, storage, and excretion. Impaired utilization of nutrients by alcoholics is indicated by:  Decreased liver stores of vitamins, such as vitamin A.  Increased excretion of nutrients such as fat. ALCOHOL AND ENERGY SUPPLY   Three basic nutritional components found in food are:  Carbohydrates.  Proteins.  Fats.  These are used as energy. Some alcoholics take  in as much as 50% of their total daily calories from alcohol. They often neglect important foods.  Even when enough food is eaten, alcohol can impair the ways the body controls blood  sugar (glucose) levels. It may either increase or decrease blood sugar.  In non-diabetic alcoholics, increased blood sugar (hyperglycemia) is caused by poor insulin secretion. It is usually temporary.  Decreased blood sugar (hypoglycemia) can cause serious injury even if this condition is short-lived. Low blood sugar can happen when a fasting or malnourished person drinks alcohol. When there is no food to supply energy, stored sugar is used up. The products of alcohol inhibit forming glucose from other compounds such as amino acids. As a result, alcohol causes the brain and other body tissue to lack glucose. It is needed for energy and function.  Alcohol is an energy source. But how the body processes and uses the energy from alcohol is complex. Also, when alcohol is substituted for carbohydrates, subjects tend to lose weight. This indicates that they get less energy from alcohol than from food. ALCOHOL - MAINTAINING CELL STRUCTURE AND FUNCTION  Structure Cells are made mostly of protein. So an adequate protein diet is important for maintaining cell structure. This is especially true if cells are being damaged. Research indicates that alcohol affects protein nutrition by causing impaired:  Digestion of proteins to amino acids.  Processing of amino acids by the small intestine and liver.  Synthesis of proteins from amino acids.  Protein secretion by the liver. Function Nutrients are essential for the body to function well. They provide the tools that the body needs to work well:   Proteins.  Vitamins.  Minerals. Alcohol can disrupt body function. It may cause nutrient deficiencies. And it may interfere with the way nutrients are processed. Vitamins  Vitamins are essential to maintain growth and normal metabolism. They regulate many of the body`s processes. Chronic heavy drinking causes deficiencies in many vitamins. This is caused by eating less. And, in some cases, vitamins may be poorly  absorbed. For example, alcohol inhibits fat absorption. It impairs how the vitamins A, E, and D are normally absorbed along with dietary fats. Not enough vitamin A may cause night blindness. Not enough vitamin D may cause softening of the bones.  Some alcoholics lack vitamins A, C, D, E, K, and the B vitamins. These are all involved in wound healing and cell maintenance. In particular, because vitamin K is necessary for blood clotting, lacking that vitamin can cause delayed clotting. The result is excess bleeding. Lacking other vitamins involved in brain function may cause severe neurological damage. Minerals Deficiencies of minerals such as calcium, magnesium, iron, and zinc are common in alcoholics. The alcohol itself does not seem to affect how these minerals are absorbed. Rather, they seem to occur secondary to other alcohol-related problems, such as:  Less calcium absorbed.  Not enough magnesium.  More urinary excretion.  Vomiting.  Diarrhea.  Not enough iron due to gastrointestinal bleeding.  Not enough zinc or losses related to other nutrient deficiencies.  Mineral deficiencies can cause a variety of medical consequences. These range from calcium-related bone disease to zinc-related night blindness and skin lesions. ALCOHOL, MALNUTRITION, AND MEDICAL COMPLICATIONS  Liver Disease   Alcoholic liver damage is caused primarily by alcohol itself. But poor nutrition may increase the risk of alcohol-related liver damage. For example, nutrients normally found in the liver are known to be affected by drinking alcohol. These include carotenoids, which are the major sources of vitamin  A, and vitamin E compounds. Decreases in such nutrients may play some role in alcohol-related liver damage. Pancreatitis  Research suggests that malnutrition may increase the risk of developing alcoholic pancreatitis. Research suggests that a diet lacking in protein may increase alcohol's damaging effect on the  pancreas. Brain  Nutritional deficiencies may have severe effects on brain function. These may be permanent. Specifically, thiamine deficiencies are often seen in alcoholics. They can cause severe neurological problems. These include:  Impaired movement.  Memory loss seen in Wernicke-Korsakoff syndrome. Pregnancy  Alcohol has toxic effects on fetal development. It causes alcohol-related birth defects. They include fetal alcohol syndrome. Alcohol itself is toxic to the fetus. Also, the nutritional deficiency can affect how the fetus develops. That may compound the risk of developmental damage.  Nutritional needs during pregnancy are 10% to 30% greater than normal. Food intake can increase by as much as 140% to cover the needs of both mother and fetus. An alcoholic mother`s nutritional problems may adversely affect the nutrition of the fetus. And alcohol itself can also restrict nutrition flow to the fetus. NUTRITIONAL STATUS OF ALCOHOLICS  Techniques for assessing nutritional status include:  Taking body measurements to estimate fat reserves. They include:  Weight.  Height.  Mass.  Skin fold thickness.  Performing blood analysis to provide measurements of circulating:  Proteins.  Vitamins.  Minerals.  These techniques tend to be imprecise. For many nutrients, there is no clear "cut-off" point that would allow an accurate definition of deficiency. So assessing the nutritional status of alcoholics is limited by these techniques. Dietary status may provide information about the risk of developing nutritional problems. Dietary status is assessed by:  Taking patients' dietary histories.  Evaluating the amount and types of food they are eating.  It is difficult to determine what exact amount of alcohol begins to have damaging effects on nutrition. In general, moderate drinkers have 2 drinks or less per day. They seem to be at little risk for nutritional problems. Various medical  disorders begin to appear at greater levels.  Research indicates that the majority of even the heaviest drinkers have few obvious nutritional deficiencies. Many alcoholics who are hospitalized for medical complications of their disease do have severe malnutrition. Alcoholics tend to eat poorly. Often they eat less than the amounts of food necessary to provide enough:  Carbohydrates.  Protein.  Fat.  Vitamins A and C.  B vitamins.  Minerals like calcium and iron. Of major concern is alcohol's effect on digesting food and use of nutrients. It may shift a mildly malnourished person toward severe malnutrition. Document Released: 07/16/2005 Document Revised: 12/14/2011 Document Reviewed: 12/30/2005 [facility] Patient Information 2014 [place].   Alcohol Intoxication Alcohol intoxication occurs when you drink enough alcohol that it affects your ability to function. It can be mild or very severe. Drinking a lot of alcohol in a short time is called binge drinking. This can be very harmful. Drinking alcohol can also be more dangerous if you are taking medicines or other drugs. Some of the effects caused by alcohol may include:  Loss of coordination.  Changes in mood and behavior.  Unclear thinking.  Trouble talking (slurred speech).  Throwing up (vomiting).  Confusion.  Slowed breathing.  Twitching and shaking (seizures).  Loss of consciousness. HOME CARE  Do not drive after drinking alcohol.  Drink enough water and fluids to keep your pee (urine) clear or pale yellow. Avoid caffeine.  Only take medicine as told by your doctor. GET HELP IF:  You throw up (vomit) many times.  You do not feel better after a few days.  You frequently have alcohol intoxication. Your doctor can help decide if you should see a substance use treatment counselor. GET HELP RIGHT AWAY IF:  You become shaky when you stop drinking.  You have twitching and shaking.  You throw up blood. It  may look bright red or like coffee grounds.  You notice blood in your poop (bowel movements).  You become lightheaded or pass out (faint). MAKE SURE YOU:   Understand these instructions.  Will watch your condition.  Will get help right away if you are not doing well or get worse. Document Released: 03/09/2008 Document Revised: 05/24/2013 Document Reviewed: 02/24/2013 [facility] Patient Information 2014 [place].

## 2013-12-18 NOTE — ED Provider Notes (Signed)
Medical screening examination/treatment/procedure(s) were conducted as a shared visit with non-physician practitioner(s) and myself.  I personally evaluated the patient during the encounter.   EKG Interpretation   Date/Time:  Thursday December 14 2013 06:31:31 EDT Ventricular Rate:  153 PR Interval:    QRS Duration: 125 QT Interval:  350 QTC Calculation: 558 R Axis:   -42 Text Interpretation:  Wide-QRS tachycardia RBBB and LAFB Baseline wander  in lead(s) V3 No significant change since last tracing Confirmed by WARD,   DO, KRISTEN ([id number]) on 12/14/2013 8:39:10 AM      Concerning for developing cholecystitis with RUQ tenderness. Recent US with stones and sludge and thickened GB wall at that time. IV zosyn now. Surgery consultation. Labs. May benefit from HIDA.   Hoy Morn, MD 12/18/13 [phone]

## 2013-12-22 ENCOUNTER — Inpatient Hospital Stay: Payer: Medicare Other

## 2014-01-31 ENCOUNTER — Emergency Department (HOSPITAL_COMMUNITY)
Admission: EM | Admit: 2014-01-31 | Discharge: 2014-02-01 | Disposition: A | Discharge status: Other Acute Inpatient Hospital | Payer: Medicare Other | Attending: Emergency Medicine | Admitting: Emergency Medicine

## 2014-01-31 ENCOUNTER — Encounter (HOSPITAL_COMMUNITY): Payer: Self-pay | Admitting: Emergency Medicine

## 2014-01-31 DIAGNOSIS — F329 Major depressive disorder, single episode, unspecified: Secondary | ICD-10-CM | POA: Diagnosis present

## 2014-01-31 DIAGNOSIS — F1020 Alcohol dependence, uncomplicated: Secondary | ICD-10-CM

## 2014-01-31 DIAGNOSIS — Z79899 Other long term (current) drug therapy: Secondary | ICD-10-CM | POA: Insufficient documentation

## 2014-01-31 DIAGNOSIS — Z8669 Personal history of other diseases of the nervous system and sense organs: Secondary | ICD-10-CM | POA: Insufficient documentation

## 2014-01-31 DIAGNOSIS — F32A Depression, unspecified: Secondary | ICD-10-CM | POA: Diagnosis present

## 2014-01-31 DIAGNOSIS — F1010 Alcohol abuse, uncomplicated: Secondary | ICD-10-CM | POA: Insufficient documentation

## 2014-01-31 DIAGNOSIS — F1910 Other psychoactive substance abuse, uncomplicated: Secondary | ICD-10-CM

## 2014-01-31 DIAGNOSIS — Z87891 Personal history of nicotine dependence: Secondary | ICD-10-CM | POA: Insufficient documentation

## 2014-01-31 DIAGNOSIS — M549 Dorsalgia, unspecified: Secondary | ICD-10-CM | POA: Insufficient documentation

## 2014-01-31 DIAGNOSIS — Z8619 Personal history of other infectious and parasitic diseases: Secondary | ICD-10-CM | POA: Insufficient documentation

## 2014-01-31 DIAGNOSIS — Z8719 Personal history of other diseases of the digestive system: Secondary | ICD-10-CM | POA: Insufficient documentation

## 2014-01-31 DIAGNOSIS — I10 Essential (primary) hypertension: Secondary | ICD-10-CM | POA: Insufficient documentation

## 2014-01-31 DIAGNOSIS — R42 Dizziness and giddiness: Secondary | ICD-10-CM | POA: Insufficient documentation

## 2014-01-31 DIAGNOSIS — F1110 Opioid abuse, uncomplicated: Secondary | ICD-10-CM | POA: Insufficient documentation

## 2014-01-31 LAB — COMPREHENSIVE METABOLIC PANEL
ALK PHOS: 86 U/L (ref 39–117)
ALT: 51 U/L (ref 0–53)
AST: 125 U/L — AB (ref 0–37)
Albumin: 3.2 g/dL — ABNORMAL LOW (ref 3.5–5.2)
BUN: 7 mg/dL (ref 6–23)
CHLORIDE: 103 meq/L (ref 96–112)
CO2: 20 mEq/L (ref 19–32)
Calcium: 8.4 mg/dL (ref 8.4–10.5)
Creatinine, Ser: 0.66 mg/dL (ref 0.50–1.35)
GFR calc Af Amer: >90 mL/min (ref >90)
GFR calc non Af Amer: >90 mL/min (ref >90)
Glucose, Bld: 120 mg/dL — ABNORMAL HIGH (ref 70–99)
POTASSIUM: 3.2 meq/L — AB (ref 3.7–5.3)
SODIUM: 141 meq/L (ref 137–147)
TOTAL PROTEIN: 7.3 g/dL (ref 6.0–8.3)
Total Bilirubin: 0.9 mg/dL (ref 0.3–1.2)

## 2014-01-31 LAB — SALICYLATE LEVEL: Salicylate Lvl: <2 mg/dL — ABNORMAL LOW (ref 2.8–20.0)

## 2014-01-31 LAB — URINALYSIS, ROUTINE W REFLEX MICROSCOPIC
BILIRUBIN URINE: NEGATIVE
Glucose, UA: NEGATIVE mg/dL
Hgb urine dipstick: NEGATIVE
KETONES UR: NEGATIVE mg/dL
Leukocytes, UA: NEGATIVE
Nitrite: NEGATIVE
PH: 6 (ref 5.0–8.0)
Protein, ur: NEGATIVE mg/dL
Specific Gravity, Urine: 1.018 (ref 1.005–1.030)
Urobilinogen, UA: 1 mg/dL (ref 0.0–1.0)

## 2014-01-31 LAB — CBC
HCT: 36.7 % — ABNORMAL LOW (ref 39.0–52.0)
Hemoglobin: 13 g/dL (ref 13.0–17.0)
MCH: 34.9 pg — ABNORMAL HIGH (ref 26.0–34.0)
MCHC: 35.4 g/dL (ref 30.0–36.0)
MCV: 98.7 fL (ref 78.0–100.0)
Platelets: 120 10*3/uL — ABNORMAL LOW (ref 150–400)
RBC: 3.72 MIL/uL — ABNORMAL LOW (ref 4.22–5.81)
RDW: 16.5 % — AB (ref 11.5–15.5)
WBC: 3.5 10*3/uL — AB (ref 4.0–10.5)

## 2014-01-31 LAB — ACETAMINOPHEN LEVEL

## 2014-01-31 LAB — RAPID URINE DRUG SCREEN, HOSP PERFORMED
Amphetamines: NOT DETECTED
Barbiturates: NOT DETECTED
Benzodiazepines: NOT DETECTED
COCAINE: NOT DETECTED
Opiates: NOT DETECTED
TETRAHYDROCANNABINOL: NOT DETECTED

## 2014-01-31 LAB — ETHANOL: ALCOHOL ETHYL (B): 300 mg/dL — AB (ref 0–11)

## 2014-01-31 MED ORDER — ONDANSETRON HCL 4 MG PO TABS: 4.0000 mg | ORAL_TABLET | Freq: Three times a day (TID) | ORAL | Status: DC | PRN

## 2014-01-31 MED ORDER — VITAMIN B-1 100 MG PO TABS
100.0000 mg | ORAL_TABLET | Freq: Every day | ORAL | Status: DC
  Administered 2014-02-01: 100 mg via ORAL
  Filled 2014-01-31: qty 1

## 2014-01-31 MED ORDER — THIAMINE HCL 100 MG/ML IJ SOLN: 100.0000 mg | Freq: Every day | INTRAMUSCULAR | Status: DC

## 2014-01-31 MED ORDER — IBUPROFEN 200 MG PO TABS
600.0000 mg | ORAL_TABLET | Freq: Three times a day (TID) | ORAL | Status: DC | PRN
  Administered 2014-02-01: 600 mg via ORAL
  Filled 2014-01-31: qty 3

## 2014-01-31 MED ORDER — ALUM & MAG HYDROXIDE-SIMETH 200-200-20 MG/5ML PO SUSP: 30.0000 mL | ORAL | Status: DC | PRN

## 2014-01-31 MED ORDER — NICOTINE 21 MG/24HR TD PT24: 21.0000 mg | MEDICATED_PATCH | Freq: Every day | TRANSDERMAL | Status: DC

## 2014-01-31 MED ORDER — LORAZEPAM 1 MG PO TABS
0.0000 mg | ORAL_TABLET | Freq: Four times a day (QID) | ORAL | Status: DC
  Administered 2014-02-01: 1 mg via ORAL
  Filled 2014-01-31: qty 1

## 2014-01-31 MED ORDER — LORAZEPAM 1 MG PO TABS: 0.0000 mg | ORAL_TABLET | Freq: Two times a day (BID) | ORAL | Status: DC

## 2014-01-31 NOTE — ED Notes (Signed)
Pt states he is here for detox from alcohol and drugs  Pt states he drinks 2 fifths of wine a day  Last drank today  Pt states he does heroin  Last time he used was last week  Pt states he is tired of living this way and needs to change  Pt states he is just sick and tired  Pt states he has been having dizziness and fainted yesterday and today  Pt states he is having back pain  Pt states it is his lower back that hurts him  Pt states his urine has a bad odor to it

## 2014-01-31 NOTE — ED Provider Notes (Signed)
CSN: [id number]     Arrival date & time 01/31/14  1856 History  This chart was scribed for non-physician practitioner, Remer Macho, PA-C, working with Kathalene Frames, MD, by Sydell Axon, ED Scribe. This patient was seen in room WTR2/WLPT2 and the patient's care was started at 9:56 PM.  The history is provided by the patient. No language interpreter was used.   HPI Comments: Nathan Frank is a 73 y.o. male who presents to the Emergency Department for medical clearance. Patient reports he is here for detox from alcohol and drugs. Patient reports he drinks approximately 2-3 fifths of wine per day and last drank today PTA. He also confirms a history of heroin use. He states that he has not used heroin today. Patient reports he usually injects heroin intramuscularly. He denies any pain in the injection sites near the abdomen and arms. Patient reports "he is tired of living this away" and that he would like to change his behaviors but has not known about a good way of doing so. He states he has never tried this process through [employer]. He denies any HI or SI. Patient reports he has had shakes consistently and DTs in the past. Patient states he has been hospitalized for heroin but not for DTs.   Patient reports that has been feeling dizzy lately and nearly fainted yesterday and today. He reports that he was walking to the restroom when he had a pre-syncopal episode and fell down to the floor. He states that this is a new problem. Patient states these were un witnessed falls. He characterizes his dizziness as a light-headedness. Patient denies any complete LOC during any of the dizzy episodes. He denies any injuries from the falls.  Additionally, he states he has been having chronic lower back pain that has been ongoing for several years.  Past Medical History  Diagnosis Date  . Neuropathy   . Hypertension   . Hepatitis C   . 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 Alcohol abuse    Past Surgical History  Procedure  Laterality Date  . Esophagogastroduodenoscopy  06/07/2012    Procedure: ESOPHAGOGASTRODUODENOSCOPY (EGD);  Surgeon: Milus Banister, MD;  Location: [facility];  Service: Endoscopy;  Laterality: N/A;  may need treatment of varices   Family History  Problem Relation Age of Onset  . Stroke Father   . Prostate cancer Brother    History  Substance Use Topics  . Smoking status: Former Research scientist (life sciences)  . Smokeless tobacco: Not on file  . Alcohol Use: Yes     Comment:  two fifths of wine per day    Review of Systems  Musculoskeletal: Positive for back pain.  Neurological: Positive for dizziness and light-headedness. Negative for syncope, weakness and headaches.  Psychiatric/Behavioral: Negative for suicidal ideas and self-injury.  All other systems reviewed and are negative.  Allergies  Review of patient's allergies indicates no known allergies.  Home Medications   Prior to Admission medications   Medication Sig Start Date End Date Taking? Authorizing Provider  omeprazole (PRILOSEC) 20 MG capsule Take 1 capsule (20 mg total) by mouth daily. 12/15/13   Ivin Poot, MD   Triage Vitals: BP 121/85  Pulse 85  Temp(Src) 97.5 F (36.4 C) (Oral)  Resp 16  SpO2 97%  Physical Exam  Nursing note and vitals reviewed. Constitutional: He is oriented to person, place, and time. He appears well-developed and well-nourished. No distress.  HENT:  Head: Normocephalic and atraumatic.  Mouth/Throat: Oropharynx is clear and moist.  Eyes: Pupils are equal, round, and reactive to light.  Normal appearance  Neck: Normal range of motion.  Pulmonary/Chest: Effort normal.  Musculoskeletal: Normal range of motion.  Entire spine non-tender.  Ambulates w/out difficulty.  Neurological: He is alert and oriented to person, place, and time.  No tremors  Psychiatric: He has a normal mood and affect. His behavior is normal.    ED Course  Procedures (including critical care time)  COORDINATION OF CARE: 10:02  PM-Will be having psychiatric team f/u with patient to see if there is a bed [facility]. Treatment plan discussed with patient and patient agrees.  Labs Review Labs Reviewed  CBC - Abnormal; Notable for the following:    WBC 3.5 (*)    RBC 3.72 (*)    HCT 36.7 (*)    MCH 34.9 (*)    RDW 16.5 (*)    Platelets 120 (*)    All other components within normal limits  COMPREHENSIVE METABOLIC PANEL - Abnormal; Notable for the following:    Potassium 3.2 (*)    Glucose, Bld 120 (*)    Albumin 3.2 (*)    AST 125 (*)    All other components within normal limits  ETHANOL - Abnormal; Notable for the following:    Alcohol, Ethyl (B) 300 (*)    All other components within normal limits  SALICYLATE LEVEL - Abnormal; Notable for the following:    Salicylate Lvl <9.7 (*)    All other components within normal limits  ACETAMINOPHEN LEVEL  URINE RAPID DRUG SCREEN (HOSP PERFORMED)  URINALYSIS, ROUTINE W REFLEX MICROSCOPIC    Imaging Review No results found.   EKG Interpretation None      MDM   Final diagnoses:  Polysubstance abuse    73yo M presents w/ request for heroine and alcohol detox.  Denies SI/HI.  Also c/o presyncope yesterday.  Pt is walking around hallway outside of his room yelling that he is lightheaded, I didn't address his lightheadedness and he hasn't even seen a doctor.  When I recommended that he sit down to avoid a fall, he responded by saying, "you haven't even treated my back pain yet".  Back pain chronic x several years and currently stable.  Medically clear; VS w/in nml range, EKG and labs non-contributory. CIWA and psych holding orders written.    I personally performed the services described in this documentation, which was scribed in my presence. The recorded information has been reviewed and is accurate.    Remer Macho, PA-C 02/01/14 [place], PA-C 02/01/14 [phone]

## 2014-02-01 ENCOUNTER — Encounter (HOSPITAL_COMMUNITY): Payer: Self-pay | Admitting: Registered Nurse

## 2014-02-01 DIAGNOSIS — F1020 Alcohol dependence, uncomplicated: Secondary | ICD-10-CM

## 2014-02-01 DIAGNOSIS — F32A Depression, unspecified: Secondary | ICD-10-CM | POA: Diagnosis present

## 2014-02-01 DIAGNOSIS — F10239 Alcohol dependence with withdrawal, unspecified: Secondary | ICD-10-CM

## 2014-02-01 DIAGNOSIS — F329 Major depressive disorder, single episode, unspecified: Secondary | ICD-10-CM

## 2014-02-01 DIAGNOSIS — F10939 Alcohol use, unspecified with withdrawal, unspecified: Secondary | ICD-10-CM

## 2014-02-01 HISTORY — DX: Depression, unspecified: F32.A

## 2014-02-01 HISTORY — DX: Major depressive disorder, single episode, unspecified: F32.9

## 2014-02-01 MED ORDER — CHLORDIAZEPOXIDE HCL 25 MG PO CAPS
25.0000 mg | ORAL_CAPSULE | Freq: Four times a day (QID) | ORAL | Status: DC
  Administered 2014-02-01 (×3): 25 mg via ORAL
  Filled 2014-02-01 (×3): qty 1

## 2014-02-01 MED ORDER — CHLORDIAZEPOXIDE HCL 25 MG PO CAPS
25.0000 mg | ORAL_CAPSULE | Freq: Once | ORAL | Status: DC
  Administered 2014-02-01: 25 mg via ORAL
  Filled 2014-02-01: qty 1

## 2014-02-01 MED ORDER — ONDANSETRON 4 MG PO TBDP: 4.0000 mg | ORAL_TABLET | Freq: Four times a day (QID) | ORAL | Status: DC | PRN

## 2014-02-01 MED ORDER — CHLORDIAZEPOXIDE HCL 25 MG PO CAPS: 25.0000 mg | ORAL_CAPSULE | Freq: Three times a day (TID) | ORAL | Status: DC

## 2014-02-01 MED ORDER — TRAZODONE HCL 100 MG PO TABS
100.0000 mg | ORAL_TABLET | Freq: Every evening | ORAL | Status: DC | PRN
  Administered 2014-02-01: 100 mg via ORAL
  Filled 2014-02-01: qty 1

## 2014-02-01 MED ORDER — LOPERAMIDE HCL 2 MG PO CAPS
2.0000 mg | ORAL_CAPSULE | ORAL | Status: DC | PRN
  Administered 2014-02-01: 2 mg via ORAL
  Filled 2014-02-01: qty 1

## 2014-02-01 MED ORDER — CHLORDIAZEPOXIDE HCL 25 MG PO CAPS: 25.0000 mg | ORAL_CAPSULE | ORAL | Status: DC

## 2014-02-01 MED ORDER — CLONIDINE HCL 0.1 MG PO TABS
0.1000 mg | ORAL_TABLET | Freq: Once | ORAL | Status: AC
  Administered 2014-02-01: 0.1 mg via ORAL
  Filled 2014-02-01: qty 1

## 2014-02-01 MED ORDER — ADULT MULTIVITAMIN W/MINERALS CH
1.0000 | ORAL_TABLET | Freq: Every day | ORAL | Status: DC
  Administered 2014-02-01: 1 via ORAL
  Filled 2014-02-01: qty 1

## 2014-02-01 MED ORDER — TRAMADOL HCL 50 MG PO TABS
50.0000 mg | ORAL_TABLET | Freq: Three times a day (TID) | ORAL | Status: DC | PRN
  Administered 2014-02-01 (×3): 50 mg via ORAL
  Filled 2014-02-01 (×3): qty 1

## 2014-02-01 MED ORDER — PANTOPRAZOLE SODIUM 40 MG PO TBEC
40.0000 mg | DELAYED_RELEASE_TABLET | Freq: Every day | ORAL | Status: DC
  Administered 2014-02-01: 40 mg via ORAL
  Filled 2014-02-01: qty 1

## 2014-02-01 MED ORDER — POTASSIUM CHLORIDE CRYS ER 20 MEQ PO TBCR
20.0000 meq | EXTENDED_RELEASE_TABLET | Freq: Every day | ORAL | Status: DC
Start: 2014-02-01 — End: 2014-02-02
  Administered 2014-02-01: 20 meq via ORAL
  Filled 2014-02-01: qty 1

## 2014-02-01 MED ORDER — HYDROXYZINE HCL 25 MG PO TABS: 25.0000 mg | ORAL_TABLET | Freq: Four times a day (QID) | ORAL | Status: DC | PRN

## 2014-02-01 MED ORDER — CHLORDIAZEPOXIDE HCL 25 MG PO CAPS: 25.0000 mg | ORAL_CAPSULE | Freq: Every day | ORAL | Status: DC

## 2014-02-01 MED ORDER — CHLORDIAZEPOXIDE HCL 25 MG PO CAPS: 25.0000 mg | ORAL_CAPSULE | Freq: Four times a day (QID) | ORAL | Status: DC | PRN

## 2014-02-01 NOTE — BH Assessment (Signed)
Referral faxed to [facility].  Boyce Medici, MA  Disposition MHT

## 2014-02-01 NOTE — BH Assessment (Signed)
02/01/2014  Referrals Faxed:  [facility]  At capacity:  [facility] Per [facility] Per [facility] No ans   [facility] per [facility]ry has requested referral be faxed over and will be reviewed this am after d/c's. Sande Rives Disposition MHT/NS

## 2014-02-01 NOTE — ED Notes (Signed)
Previous 2 notes on wrong patient

## 2014-02-01 NOTE — ED Notes (Signed)
Patient reports that he trying to get himself together because his wife is moving back with him. She is currently residing in [place] waiting on their son to get out of prison on June 5. 2015. Patient reports that he is tired of drinking he ready to give it a rest.

## 2014-02-01 NOTE — Consult Note (Signed)
  Patient states over the past several months his alcohol consumption has increased to where he is drinking two fifths of wine daily.  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  Patient states that his longest period sober was 12 yrs.  Patient states that he did use heroin 2 weeks ago "and I'm upset with myself for that. I don't want to start that again."     Will Start Librium Protocol for alcohol withdrawal.  Will also give K-Dur 20 MEq for 2 dose.    Agree with TTS assessment for inpatient detox treatment.  Patient has been accepted to [facility].  Letitia Libra, RN [facility]) will call with room/bed number when ready for transfer.  Ginnette Gates B. Marcele Kosta FNP-BC

## 2014-02-01 NOTE — BH Assessment (Signed)
Tele Assessment Note   Nathan Frank is an 73 y.o. male.  -Clinician talked to Gertha Calkin, [place] about need for TTS.  Pt is requesting detox from ETOH.  Drinking two 5th bottles daily of wine.  Some recent heroin use and marijuana use.  Patient is requesting detox from ETOH.  Currently drinking two fifths of wine daily for the last 6 months.  Patient last drank about 17:00 on 04/29.  Pt has been to detox at [facility] in [place] 5 months ago for his heroin abuse.  Pt does admit to using heroin two weeks ago.  First time since that detox and he says, "I'm really disgusted with myself for doing it."  Patient says that he uses marijuana if someone has it, does not go out looking for it.  Patient denies any SI, HI or A/V hallucinations.  Patient has no psychiatrist or therapist.  Some stressors include being tired of drinking all the time.  Also he is anticipating his wife coming home from [place] after their son is released from prison in June.  Pt also cites his mother and his uncle passing away about 18 months ago.  -clinician talked with Patriciaann Clan, [place] who accepted patient to a 300 hall bed once one is open today.  Referrals were also sent out to [facility] [place], [place].  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 Neuropathy   . Hypertension   . Hepatitis C   . 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 Alcohol abuse    Axis IV: occupational problems and other psychosocial or environmental problems Axis V: 31-40 impairment in reality testing  Past Medical History:  Past Medical History  Diagnosis Date  . Neuropathy   . Hypertension   . Hepatitis C   . 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 Alcohol abuse     Past Surgical History  Procedure Laterality Date  . Esophagogastroduodenoscopy  06/07/2012    Procedure: ESOPHAGOGASTRODUODENOSCOPY (EGD);  Surgeon: Milus Banister, MD;  Location: [facility];  Service: Endoscopy;  Laterality:  N/A;  may need treatment of varices    Family History:  Family History  Problem Relation Age of Onset  . Stroke Father   . Prostate cancer Brother     Social History:  reports that he has quit smoking. He does not have any smokeless tobacco history on file. He reports that he drinks alcohol. He reports that he uses illicit drugs (Marijuana).  Additional Social History:  Alcohol / Drug Use Pain Medications: None Prescriptions: Trazadone (needs to get prescription refilled) Over the Counter: N/A History of alcohol / drug use?: Yes Longest period of sobriety (when/how long): "For a few years in the '70's Negative Consequences of Use: Personal relationships Withdrawal Symptoms: Diarrhea;Weakness;Sweats Substance #1 Name of Substance 1: ETOH 1 - Age of First Use: "Before I was even a teenager." 1 - Amount (size/oz): Two fifths of wine daily. 1 - Frequency: Daily use 1 - Duration: Last 6 months at that rate 1 - Last Use / Amount: 04/29 around 17:00  CIWA: CIWA-Ar BP: 166/97 mmHg Pulse Rate: 78 Nausea and Vomiting: no nausea and no vomiting Tactile Disturbances: none Tremor: no tremor Auditory Disturbances: very mild harshness or ability to frighten Paroxysmal Sweats: no sweat visible Visual Disturbances: mild sensitivity Anxiety: no anxiety, at ease Headache, Fullness in Head: mild Agitation: normal activity Orientation and Clouding of Sensorium: oriented and can do serial additions CIWA-Ar Total:  5 COWS:    Allergies: No Known Allergies  Home Medications:  (Not in a hospital admission)  OB/GYN Status:  No LMP for male patient.  General Assessment Data [facility] of Assessment: [facility] Is this a Tele or Face-to-Face Assessment?: Tele Assessment Is this an Initial Assessment or a Re-assessment for this encounter?: Initial Assessment Living Arrangements: Alone Can pt return to current living arrangement?: Yes Admission Status: Voluntary Is patient capable of signing  voluntary admission?: Yes Transfer from: [facility] Referral Source: Self/Family/Friend     [place] Living Arrangements: Alone Name of Psychiatrist: None Name of Therapist: None     Risk to self Suicidal Ideation: No Suicidal Intent: No Is patient at risk for suicide?: No Suicidal Plan?: No Access to Means: No What has been your use of drugs/alcohol within the last 12 months?: None Previous Attempts/Gestures: No How many times?: 0 Other Self Harm Risks: None Triggers for Past Attempts: None known Intentional Self Injurious Behavior: None Family Suicide History: No Recent stressful life event(s): Loss (Comment);Other (Comment) (Tired of drinking; Mother & an uncle died 17 mo ago) Persecutory voices/beliefs?: No Depression: Yes Depression Symptoms: Despondent;Isolating;Insomnia;Guilt;Loss of interest in usual pleasures Substance abuse history and/or treatment for substance abuse?: Yes Suicide prevention information given to non-admitted patients: Not applicable  Risk to Others Homicidal Ideation: No Thoughts of Harm to Others: No Current Homicidal Intent: No Current Homicidal Plan: No Access to Homicidal Means: No Identified Victim: No one History of harm to others?: No Assessment of Violence: None Noted Violent Behavior Description: None reported Does patient have access to weapons?: No Criminal Charges Pending?: No Does patient have a court date: No  Psychosis Hallucinations: None noted Delusions: None noted  Mental Status Report Appear/Hygiene: Disheveled Eye Contact: Good Motor Activity: Freedom of movement;Restlessness Speech: Logical/coherent Level of Consciousness: Quiet/awake Mood: Sad Affect: Depressed Anxiety Level: Minimal Thought Processes: Coherent;Relevant Judgement: Impaired Orientation: Person;Place;Time;Situation Obsessive Compulsive Thoughts/Behaviors: None  Cognitive Functioning Concentration: Normal Memory: Recent  Impaired;Remote Intact IQ: Average Insight: Fair Impulse Control: Poor Appetite: Fair Weight Loss: 0 Weight Gain: 0 Sleep: Decreased Total Hours of Sleep:  (<5H/D) Vegetative Symptoms: None  ADLScreening [facility] Assessment Services) Patient's cognitive ability adequate to safely complete daily activities?: Yes Patient able to express need for assistance with ADLs?: No Independently performs ADLs?: Yes (appropriate for developmental age)  Prior Inpatient Therapy Prior Inpatient Therapy: Yes Prior Therapy Dates: 5 months ago [facility] [place] Reason for Treatment: [facility]  Prior Outpatient Therapy Prior Outpatient Therapy: No Prior Therapy Dates: None Prior Therapy Facilty/Provider(s): None Reason for Treatment: None  ADL Screening (condition at time of admission) Patient's cognitive ability adequate to safely complete daily activities?: Yes Is the patient deaf or have difficulty hearing?: No Does the patient have difficulty seeing, even when wearing glasses/contacts?: No Does the patient have difficulty concentrating, remembering, or making decisions?: No Patient able to express need for assistance with ADLs?: No Does the patient have difficulty dressing or bathing?: No Independently performs ADLs?: Yes (appropriate for developmental age) Does the patient have difficulty walking or climbing stairs?: No Weakness of Legs: None Weakness of Arms/Hands: None       Abuse/Neglect Assessment (Assessment to be complete while patient is alone) Physical Abuse: Denies Verbal Abuse: Denies Sexual Abuse: Denies Exploitation of patient/patient's resources: Denies Self-Neglect: Denies Values / Beliefs Cultural Requests During Hospitalization: None Spiritual Requests During Hospitalization: None   Advance Directives (For Healthcare) Advance Directive: Patient does not have advance directive;Patient would  not like information Pre-existing out of  facility DNR order (yellow form or pink MOST form): No    Additional Information 1:1 In Past 12 Months?: No CIRT Risk: No Elopement Risk: No Does patient have medical clearance?: Yes     Disposition:  Disposition Initial Assessment Completed for this Encounter: Yes Disposition of Patient: Inpatient treatment program;Referred to Type of inpatient treatment program: Adult Patient referred to:  [facility] accepted pending a 300 hall bed.  Refer out also.)  [name] 02/01/2014 6:29 AM

## 2014-02-01 NOTE — Consult Note (Signed)
Face to face evaluation and I agree with this note 

## 2014-02-01 NOTE — ED Notes (Signed)
12 step meeting schedule given and medication education completed.

## 2014-02-01 NOTE — Progress Notes (Signed)
Patient has been accepted to [facility] Bed 306-1.  Support paperwork has been faxed to [facility].

## 2014-02-01 NOTE — Progress Notes (Addendum)
Accepted to a [place] at [facility], Nathan Frank will call with a bed assignment.  Per, dr. Lovena Le patient meets criteria for inpatient hospitalization.

## 2014-02-02 ENCOUNTER — Inpatient Hospital Stay (HOSPITAL_COMMUNITY)
Admission: AD | Admit: 2014-02-02 | Discharge: 2014-02-05 | DRG: 897 | Disposition: A | Discharge status: Home / Self Care | Payer: 59 | Source: Intra-hospital | Attending: Psychiatry | Admitting: Psychiatry

## 2014-02-02 ENCOUNTER — Encounter (HOSPITAL_COMMUNITY): Payer: Self-pay

## 2014-02-02 DIAGNOSIS — Z09 Encounter for follow-up examination after completed treatment for conditions other than malignant neoplasm: Secondary | ICD-10-CM

## 2014-02-02 DIAGNOSIS — G4700 Insomnia, unspecified: Secondary | ICD-10-CM | POA: Diagnosis present

## 2014-02-02 DIAGNOSIS — F411 Generalized anxiety disorder: Secondary | ICD-10-CM | POA: Diagnosis present

## 2014-02-02 DIAGNOSIS — I10 Essential (primary) hypertension: Secondary | ICD-10-CM | POA: Diagnosis present

## 2014-02-02 DIAGNOSIS — F1020 Alcohol dependence, uncomplicated: Secondary | ICD-10-CM

## 2014-02-02 DIAGNOSIS — Z823 Family history of stroke: Secondary | ICD-10-CM

## 2014-02-02 DIAGNOSIS — F332 Major depressive disorder, recurrent severe without psychotic features: Secondary | ICD-10-CM | POA: Diagnosis present

## 2014-02-02 DIAGNOSIS — F1994 Other psychoactive substance use, unspecified with psychoactive substance-induced mood disorder: Secondary | ICD-10-CM | POA: Diagnosis present

## 2014-02-02 DIAGNOSIS — Z9289 Personal history of other medical treatment: Secondary | ICD-10-CM

## 2014-02-02 DIAGNOSIS — F1210 Cannabis abuse, uncomplicated: Secondary | ICD-10-CM | POA: Diagnosis present

## 2014-02-02 DIAGNOSIS — K7460 Unspecified cirrhosis of liver: Secondary | ICD-10-CM | POA: Diagnosis present

## 2014-02-02 DIAGNOSIS — F32A Depression, unspecified: Secondary | ICD-10-CM

## 2014-02-02 DIAGNOSIS — Z8042 Family history of malignant neoplasm of prostate: Secondary | ICD-10-CM

## 2014-02-02 DIAGNOSIS — F329 Major depressive disorder, single episode, unspecified: Secondary | ICD-10-CM

## 2014-02-02 DIAGNOSIS — K219 Gastro-esophageal reflux disease without esophagitis: Secondary | ICD-10-CM | POA: Diagnosis present

## 2014-02-02 DIAGNOSIS — Z87891 Personal history of nicotine dependence: Secondary | ICD-10-CM

## 2014-02-02 HISTORY — DX: Alcohol dependence, uncomplicated: F10.20

## 2014-02-02 HISTORY — DX: Personal history of other medical treatment: Z92.89

## 2014-02-02 MED ORDER — IBUPROFEN 600 MG PO TABS
600.0000 mg | ORAL_TABLET | Freq: Four times a day (QID) | ORAL | Status: DC | PRN
  Administered 2014-02-02 – 2014-02-03 (×3): 600 mg via ORAL
  Filled 2014-02-02 (×3): qty 1

## 2014-02-02 MED ORDER — LOPERAMIDE HCL 2 MG PO CAPS: 2.0000 mg | ORAL_CAPSULE | ORAL | Status: AC | PRN

## 2014-02-02 MED ORDER — ACETAMINOPHEN 325 MG PO TABS: 650.0000 mg | ORAL_TABLET | Freq: Four times a day (QID) | ORAL | Status: DC | PRN

## 2014-02-02 MED ORDER — TRAZODONE HCL 100 MG PO TABS
100.0000 mg | ORAL_TABLET | Freq: Every evening | ORAL | Status: DC | PRN
  Administered 2014-02-02 – 2014-02-03 (×3): 100 mg via ORAL
  Filled 2014-02-02: qty 4
  Filled 2014-02-02 (×4): qty 1

## 2014-02-02 MED ORDER — CHLORDIAZEPOXIDE HCL 25 MG PO CAPS
25.0000 mg | ORAL_CAPSULE | Freq: Once | ORAL | Status: AC
  Administered 2014-02-02: 25 mg via ORAL
  Filled 2014-02-02: qty 1

## 2014-02-02 MED ORDER — CHLORDIAZEPOXIDE HCL 25 MG PO CAPS
25.0000 mg | ORAL_CAPSULE | Freq: Three times a day (TID) | ORAL | Status: AC
  Administered 2014-02-03 (×3): 25 mg via ORAL
  Filled 2014-02-02 (×3): qty 1

## 2014-02-02 MED ORDER — CHLORDIAZEPOXIDE HCL 25 MG PO CAPS
25.0000 mg | ORAL_CAPSULE | Freq: Four times a day (QID) | ORAL | Status: AC | PRN
  Administered 2014-02-03 – 2014-02-04 (×2): 25 mg via ORAL
  Filled 2014-02-02: qty 1

## 2014-02-02 MED ORDER — CHLORDIAZEPOXIDE HCL 25 MG PO CAPS
25.0000 mg | ORAL_CAPSULE | Freq: Every day | ORAL | Status: AC
  Administered 2014-02-05: 25 mg via ORAL
  Filled 2014-02-02: qty 1

## 2014-02-02 MED ORDER — ONDANSETRON 4 MG PO TBDP: 4.0000 mg | ORAL_TABLET | Freq: Four times a day (QID) | ORAL | Status: AC | PRN

## 2014-02-02 MED ORDER — CHLORDIAZEPOXIDE HCL 25 MG PO CAPS
25.0000 mg | ORAL_CAPSULE | ORAL | Status: AC
  Filled 2014-02-02 (×2): qty 1

## 2014-02-02 MED ORDER — MAGNESIUM HYDROXIDE 400 MG/5ML PO SUSP: 30.0000 mL | Freq: Every day | ORAL | Status: DC | PRN

## 2014-02-02 MED ORDER — CHLORDIAZEPOXIDE HCL 25 MG PO CAPS
25.0000 mg | ORAL_CAPSULE | Freq: Four times a day (QID) | ORAL | Status: AC
  Administered 2014-02-02 (×4): 25 mg via ORAL
  Filled 2014-02-02 (×4): qty 1

## 2014-02-02 MED ORDER — PANTOPRAZOLE SODIUM 40 MG PO TBEC
40.0000 mg | DELAYED_RELEASE_TABLET | Freq: Every day | ORAL | Status: DC
  Administered 2014-02-02 – 2014-02-05 (×3): 40 mg via ORAL
  Filled 2014-02-02 (×7): qty 1

## 2014-02-02 MED ORDER — ADULT MULTIVITAMIN W/MINERALS CH
1.0000 | ORAL_TABLET | Freq: Every day | ORAL | Status: DC
  Administered 2014-02-02 – 2014-02-05 (×3): 1 via ORAL
  Filled 2014-02-02 (×7): qty 1

## 2014-02-02 MED ORDER — TRAMADOL HCL 50 MG PO TABS
50.0000 mg | ORAL_TABLET | Freq: Three times a day (TID) | ORAL | Status: DC | PRN
  Administered 2014-02-02 – 2014-02-05 (×6): 50 mg via ORAL
  Filled 2014-02-02 (×6): qty 1

## 2014-02-02 MED ORDER — POTASSIUM CHLORIDE CRYS ER 20 MEQ PO TBCR
20.0000 meq | EXTENDED_RELEASE_TABLET | Freq: Every day | ORAL | Status: AC
  Administered 2014-02-02 – 2014-02-03 (×2): 20 meq via ORAL
  Filled 2014-02-02: qty 1
  Filled 2014-02-02: qty 2
  Filled 2014-02-02: qty 1

## 2014-02-02 MED ORDER — HYDROXYZINE HCL 25 MG PO TABS
25.0000 mg | ORAL_TABLET | Freq: Four times a day (QID) | ORAL | Status: AC | PRN
  Administered 2014-02-02 – 2014-02-04 (×3): 25 mg via ORAL
  Filled 2014-02-02 (×4): qty 1

## 2014-02-02 NOTE — BHH Suicide Risk Assessment (Signed)
[facility] INPATIENT: Family/Significant Other Suicide Prevention Education  Suicide Prevention Education:  Education Completed; No one has been identified by the patient as the family member/significant other with whom the patient will be residing, and identified as the person(s) who will aid the patient in the event of a mental health crisis (suicidal ideations/suicide attempt).   Pt did not c/o SI at admission, nor have they endorsed SI during their stay here. SPE not required. Pt provided with SPI pamphlet and encouraged to share with support network, ask questions, and talk about any concerns relating to SPE.  [place], [place] 02/02/2014 3:22 PM

## 2014-02-02 NOTE — H&P (Signed)
Psychiatric Admission Assessment Adult  Patient Identification:  Nathan Frank Date of Evaluation:  02/02/2014 Chief Complaint:  ALCOHOL USE DISORDER,SEVERE History of Present Illness:: Nathan Frank is an 73 y.o. male. -Clinician talked to Gertha Calkin, PA about need for TTS. Pt is requesting detox from ETOH. Drinking two 5th bottles daily of wine. Some recent heroin use and marijuana use. Patient is requesting detox from ETOH. Currently drinking two fifths of wine daily for the last 6 months. Patient last drank about 17:00 on 04/29. Pt has been to detox at [facility] in [place] 5 months ago for his heroin abuse. Pt does admit to using heroin two weeks ago. First time since that detox and he says, "I'm really disgusted with myself for doing it." Patient says that he uses marijuana if someone has it, does not go out looking for it. Patient denies any SI, HI or A/V hallucinations. Patient has no psychiatrist or therapist. Some stressors include being tired of drinking all the time. Also he is anticipating his wife coming home from [place] after their son is released from prison in June. Pt also cites his mother and his uncle passing away about 18 months ago.    Subjective: Pt seen and chart reviewed. During admission assessment, pt denies SI, HI, and AVH, contracts for safety. Pt rates anxiety and depression as minimal. 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 Pt also reports that he was an IV drug user "for about 40 years". Pt reports that he drinks approximately "1-2 5ths per day of the 14% wine, not the stronger stuff" and that he has been doing this for years and wants to get clean. Pt reports that he moved from [place], where his wife lives now (she is moving here soon) so that he could take care of his mother, who recently passed away. Pt does report smoking marijuana, but "only about once a month of my buddy comes over for [place] to watch the game".      Elements:  Location:   Generalized, Inpatient BHH. Quality:  Stable. Severity:  Severe. Timing:  Constant. Duration:  Chronic x years. Associated Signs/Synptoms: Depression Symptoms:  depressed mood, anhedonia, insomnia, psychomotor retardation, fatigue, feelings of worthlessness/guilt, hopelessness, insomnia, loss of energy/fatigue, disturbed sleep, (Hypo) Manic Symptoms:  Irritable Mood, Anxiety Symptoms:  Excessive Worry, Psychotic Symptoms:  Denies PTSD Symptoms: Denies Total Time spent with patient: Greater than 30 minutes  Psychiatric Specialty Exam: Physical Exam Full Physical Exam performed in ED; reviewed, stable, and I concur with this assessment.   Review of Systems  Constitutional: Negative.   HENT: Negative.   Eyes: Negative.   Respiratory: Negative.   Cardiovascular: Negative.   Gastrointestinal: Negative.   Genitourinary: Negative.   Musculoskeletal: Negative.   Skin: Negative.   Neurological: Negative.   Endo/Heme/Allergies: Negative.   Psychiatric/Behavioral: Positive for depression and substance abuse. The patient is nervous/anxious.     Blood pressure 156/95, pulse 96, temperature 98.3 F (36.8 C), temperature source Oral, resp. rate 18, height 6' (1.829 m), weight 80.74 kg (178 lb).Body mass index is 24.14 kg/(m^2).  General Appearance: Fairly Groomed  Engineer, water::  Good  Speech:  Clear and Coherent  Volume:  Normal  Mood:  Depressed  Affect:  Depressed  Thought Process:  Coherent and Goal Directed  Orientation:  Full (Time, Place, and Person)  Thought Content:  WDL  Suicidal Thoughts:  No  Homicidal Thoughts:  No  Memory:  Immediate;   Good Recent;  Good Remote;   Good  Judgement:  Fair  Insight:  Fair  Psychomotor Activity:  Normal  Concentration:  Good  Recall:  [place] of Knowledge:Good  Language: Fair  Akathisia:  NA  Handed:    AIMS (if indicated):     Assets:  Communication Skills Desire for Improvement Resilience  Sleep:  Number of Hours:  3.75    Musculoskeletal: Strength & Muscle Tone: within normal limits Gait & Station: normal Patient leans: N/A  Past Psychiatric History: Diagnosis: MDD, Alcohol Abuse, Substance Abuse, Substance induced mood disorder  Hospitalizations: Denies  Outpatient Care: Denies  Substance Abuse Care: In [place]  Self-Mutilation: Denies  Suicidal Attempts: Denies  Violent Behaviors: Denies   Past Medical History:   Past Medical History  Diagnosis Date  . Neuropathy   . Hypertension   . Hepatitis C   . 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 Alcohol abuse    None. Allergies:  No Known Allergies PTA Medications: Prescriptions prior to admission  Medication Sig Dispense Refill  . omeprazole (PRILOSEC) 20 MG capsule Take 1 capsule (20 mg total) by mouth daily.  60 capsule  0  . traMADol (ULTRAM) 50 MG tablet Take 50 mg by mouth every 8 (eight) hours as needed for moderate pain.      . traZODone (DESYREL) 100 MG tablet Take 100 mg by mouth at bedtime as needed for sleep.        Previous Psychotropic Medications:  Medication/Dose  SEE MAR               Substance Abuse History in the last 12 months:  yes  Consequences of Substance Abuse: Medical Consequences:  Hospitalization  Social History:  reports that he has quit smoking. He does not have any smokeless tobacco history on file. He reports that he drinks alcohol. He reports that he uses illicit drugs (Marijuana). Additional Social History:                      Current Place of Residence:  [place] of Birth:  [place] Family Members: Wife Marital Status:  Married Children:  Sons: 1  Daughters: 2 Relationships: Married Education:  1 yr grad school Educational Problems/Performance: Denies Religious Beliefs/Practices: History of Abuse (Emotional/Phsycial/Sexual) Denies Pensions consultant; Social Music therapist History:  [employer] Legal History: Denies Hobbies/Interests: Reading, TV  Family History:   Family History  Problem  Relation Age of Onset  . Stroke Father   . Prostate cancer Brother     Results for orders placed during the hospital encounter of 01/31/14 (from the past 72 hour(s))  ACETAMINOPHEN LEVEL     Status: None   Collection Time    01/31/14  8:02 PM      Result Value Ref Range   Acetaminophen (Tylenol), Serum <15.0  10 - 30 ug/mL   Comment:            THERAPEUTIC CONCENTRATIONS VARY     SIGNIFICANTLY. A RANGE OF 10-30     ug/mL MAY BE AN EFFECTIVE     CONCENTRATION FOR MANY PATIENTS.     HOWEVER, SOME ARE BEST TREATED     AT CONCENTRATIONS OUTSIDE THIS     RANGE.     ACETAMINOPHEN CONCENTRATIONS     >150 ug/mL AT 4 HOURS AFTER     INGESTION AND >50 ug/mL AT 12     HOURS AFTER INGESTION ARE     OFTEN ASSOCIATED WITH TOXIC     REACTIONS.  CBC  Status: Abnormal   Collection Time    01/31/14  8:02 PM      Result Value Ref Range   WBC 3.5 (*) 4.0 - 10.5 K/uL   RBC 3.72 (*) 4.22 - 5.81 MIL/uL   Hemoglobin 13.0  13.0 - 17.0 g/dL   HCT 36.7 (*) 39.0 - 52.0 %   MCV 98.7  78.0 - 100.0 fL   MCH 34.9 (*) 26.0 - 34.0 pg   MCHC 35.4  30.0 - 36.0 g/dL   RDW 16.5 (*) 11.5 - 15.5 %   Platelets 120 (*) 150 - 400 K/uL  COMPREHENSIVE METABOLIC PANEL     Status: Abnormal   Collection Time    01/31/14  8:02 PM      Result Value Ref Range   Sodium 141  137 - 147 mEq/L   Potassium 3.2 (*) 3.7 - 5.3 mEq/L   Chloride 103  96 - 112 mEq/L   CO2 20  19 - 32 mEq/L   Glucose, Bld 120 (*) 70 - 99 mg/dL   BUN 7  6 - 23 mg/dL   Creatinine, Ser 0.66  0.50 - 1.35 mg/dL   Calcium 8.4  8.4 - 10.5 mg/dL   Total Protein 7.3  6.0 - 8.3 g/dL   Albumin 3.2 (*) 3.5 - 5.2 g/dL   AST 125 (*) 0 - 37 U/L   ALT 51  0 - 53 U/L   Alkaline Phosphatase 86  39 - 117 U/L   Total Bilirubin 0.9  0.3 - 1.2 mg/dL   GFR calc non Af Amer >90  >90 mL/min   GFR calc Af Amer >90  >90 mL/min   Comment: (NOTE)     The eGFR has been calculated using the CKD EPI equation.     This calculation has not been validated in all  clinical situations.     eGFR's persistently <90 mL/min signify possible Chronic Kidney     Disease.  ETHANOL     Status: Abnormal   Collection Time    01/31/14  8:02 PM      Result Value Ref Range   Alcohol, Ethyl (B) 300 (*) 0 - 11 mg/dL   Comment:            LOWEST DETECTABLE LIMIT FOR     SERUM ALCOHOL IS 11 mg/dL     FOR MEDICAL PURPOSES ONLY  SALICYLATE LEVEL     Status: Abnormal   Collection Time    01/31/14  8:02 PM      Result Value Ref Range   Salicylate Lvl <5.6 (*) 2.8 - 20.0 mg/dL  URINE RAPID DRUG SCREEN (HOSP PERFORMED)     Status: None   Collection Time    01/31/14  8:09 PM      Result Value Ref Range   Opiates NONE DETECTED  NONE DETECTED   Cocaine NONE DETECTED  NONE DETECTED   Benzodiazepines NONE DETECTED  NONE DETECTED   Amphetamines NONE DETECTED  NONE DETECTED   Tetrahydrocannabinol NONE DETECTED  NONE DETECTED   Barbiturates NONE DETECTED  NONE DETECTED   Comment:            DRUG SCREEN FOR MEDICAL PURPOSES     ONLY.  IF CONFIRMATION IS NEEDED     FOR ANY PURPOSE, NOTIFY LAB     WITHIN 5 DAYS.                LOWEST DETECTABLE LIMITS     FOR URINE DRUG SCREEN  Drug Class       Cutoff (ng/mL)     Amphetamine      1000     Barbiturate      200     Benzodiazepine   299     Tricyclics       371     Opiates          300     Cocaine          300     THC              50  URINALYSIS, ROUTINE W REFLEX MICROSCOPIC     Status: None   Collection Time    01/31/14  8:09 PM      Result Value Ref Range   Color, Urine YELLOW  YELLOW   APPearance CLEAR  CLEAR   Specific Gravity, Urine 1.018  1.005 - 1.030   pH 6.0  5.0 - 8.0   Glucose, UA NEGATIVE  NEGATIVE mg/dL   Hgb urine dipstick NEGATIVE  NEGATIVE   Bilirubin Urine NEGATIVE  NEGATIVE   Ketones, ur NEGATIVE  NEGATIVE mg/dL   Protein, ur NEGATIVE  NEGATIVE mg/dL   Urobilinogen, UA 1.0  0.0 - 1.0 mg/dL   Nitrite NEGATIVE  NEGATIVE   Leukocytes, UA NEGATIVE  NEGATIVE   Comment: MICROSCOPIC NOT DONE  ON URINES WITH NEGATIVE PROTEIN, BLOOD, LEUKOCYTES, NITRITE, OR GLUCOSE <1000 mg/dL.   Psychological Evaluations:  Assessment:   DSM5:  Substance/Addictive Disorders:  Alcohol Related Disorder - Severe (303.90) Depressive Disorders:  Major Depressive Disorder - Severe (296.23)  AXIS I:  Alcohol Abuse, Major Depression, Recurrent severe, Substance Abuse and Substance Induced Mood Disorder AXIS II:  Deferred AXIS III:   Past Medical History  Diagnosis Date  . Neuropathy   . Hypertension   . Hepatitis C   . 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 Alcohol abuse    AXIS IV:  other psychosocial or environmental problems and problems related to social environment AXIS V:  41-50 serious symptoms  Treatment Plan/Recommendations:   Review of chart, vital signs, medications, and notes.  1-Individual and group therapy  2-Medication management for depression and anxiety: Medications reviewed with the patient and she stated no untoward effects, unchanged. 3-Coping skills for depression, anxiety  4-Continue crisis stabilization and management  5-Address health issues--monitoring vital signs, stable  6-Treatment plan in progress to prevent relapse of depression and anxiety  Treatment Plan Summary: Daily contact with patient to assess and evaluate symptoms and progress in treatment Medication management Current Medications:  Current Facility-Administered Medications  Medication Dose Route Frequency Provider Last Rate Last Dose  . acetaminophen (TYLENOL) tablet 650 mg  650 mg Oral Q6H PRN Shuvon Rankin, NP      . chlordiazePOXIDE (LIBRIUM) capsule 25 mg  25 mg Oral Q6H PRN Shuvon Rankin, NP      . chlordiazePOXIDE (LIBRIUM) capsule 25 mg  25 mg Oral QID Shuvon Rankin, NP   25 mg at 02/02/14 1158   Followed by  . [START ON 02/03/2014] chlordiazePOXIDE (LIBRIUM) capsule 25 mg  25 mg Oral TID Shuvon Rankin, NP       Followed by  . [START ON 02/04/2014] chlordiazePOXIDE (LIBRIUM) capsule 25 mg  25 mg Oral BH-qamhs  Shuvon Rankin, NP       Followed by  . [START ON 02/05/2014] chlordiazePOXIDE (LIBRIUM) capsule 25 mg  25 mg Oral Daily Shuvon Rankin, NP      . hydrOXYzine (ATARAX/VISTARIL) tablet 25 mg  25  mg Oral Q6H PRN Shuvon Rankin, NP      . loperamide (IMODIUM) capsule 2-4 mg  2-4 mg Oral PRN Shuvon Rankin, NP      . magnesium hydroxide (MILK OF MAGNESIA) suspension 30 mL  30 mL Oral Daily PRN Shuvon Rankin, NP      . multivitamin with minerals tablet 1 tablet  1 tablet Oral Daily Shuvon Rankin, NP   1 tablet at 02/02/14 0842  . ondansetron (ZOFRAN-ODT) disintegrating tablet 4 mg  4 mg Oral Q6H PRN Shuvon Rankin, NP      . pantoprazole (PROTONIX) EC tablet 40 mg  40 mg Oral Daily Shuvon Rankin, NP   40 mg at 02/02/14 0841  . potassium chloride SA (K-DUR,KLOR-CON) CR tablet 20 mEq  20 mEq Oral Daily Shuvon Rankin, NP   20 mEq at 02/02/14 0843  . traMADol (ULTRAM) tablet 50 mg  50 mg Oral Q8H PRN Shuvon Rankin, NP   50 mg at 02/02/14 0841  . traZODone (DESYREL) tablet 100 mg  100 mg Oral QHS PRN Shuvon Rankin, NP   100 mg at 02/02/14 0117    Observation Level/Precautions:  15 minute checks  Laboratory:  Labs resulted, reviewed, and stable at this time.   Psychotherapy:  Group therapy, individual therapy, psychoeducation  Medications:  See MAR above  Consultations: None    Discharge Concerns: None    Estimated LOS: 5-7 days  Other:  N/A    I certify that inpatient services furnished can reasonably be expected to improve the patient's condition.   Benjamine Mola, FNP-BC 5/1/201512:10 PM Personally evaluated the patient, reviewed the physical exam and labs, formulated the treatment plan Geralyn Flash A. Lawrance Wiedemann,M.D.

## 2014-02-02 NOTE — Progress Notes (Signed)
D: Patient denies SI/HI and A/V hallucinations; patient reports sleep is fair; reports appetite is improving; reports energy level is low ; reports ability to pay attention is improving; patient reports being fatigued and having tremors  A: Monitored q 15 minutes; patient encouraged to attend groups; patient educated about medications; patient given medications per physician orders; patient encouraged to express feelings and/or concerns  R: Patient is appropriate to circumstances and cooperative; patient has been in the bed most of the day; patient did go to lunch and ambulating with his cane and is steady with the cane; patient has not had any complaints this evening; patient's interaction with staff and peers is appropriate; patient was able to set goal to talk with staff 1:1 when having feelings of SI; patient is taking medications as prescribed and tolerating medications

## 2014-02-02 NOTE — ED Provider Notes (Signed)
Medical screening examination/treatment/procedure(s) were performed by non-physician practitioner and as supervising physician I was immediately available for consultation/collaboration.   EKG Interpretation   Date/Time:  Wednesday January 31 2014 19:52:18 EDT Ventricular Rate:  76 PR Interval:  139 QRS Duration: 139 QT Interval:  444 QTC Calculation: 499 R Axis:   87 Text Interpretation:  Sinus rhythm Right bundle branch block Baseline  wander Since last tracing rate slower Reconfirmed by Koury Roddy  MD-J, Adaisha Campise  ([id number]) on 02/02/2014 3:45:24 PM      Kathalene Frames, MD 02/02/14 [phone]

## 2014-02-02 NOTE — Clinical Social Work Note (Signed)
CSW attempted to meet with pt both this morning after d/c planning group and after afternoon therapy group to complete PSA. Pt was sleeping heavily during both of these attempts. CSW unable to complete PSA today. Per MD, pt is wanting to return home and does not want inpatient treatment referral. CSW will meet with pt on Monday morning to discuss aftercare plan (outpatient options).   [place], [place]  02/02/2014 3:21 PM

## 2014-02-02 NOTE — BHH Group Notes (Signed)
Adult Psychoeducational Group Note  Date:  02/02/2014 Time:  10:51 PM  Group Topic/Focus:  AA Meeting  Participation Level:  Active  Participation Quality:  Appropriate and Attentive  Affect:  Flat  Cognitive:  Appropriate  Insight: Appropriate  Engagement in Group:  Engaged  Modes of Intervention:  Discussion and Education  Additional Comments:  Jabarri attended group.  Jessia Kief A Ria Comment 02/02/2014, 10:51 PM

## 2014-02-02 NOTE — BHH Group Notes (Signed)
[facility] LCSW Group Therapy  02/02/2014 3:08 PM  Type of Therapy:  Group Therapy  Participation Level:  Did Not Attend-pt sleeping in room during afternoon therapy group.   Avabella Wailes Smart LCSWA  02/02/2014, 3:08 PM

## 2014-02-02 NOTE — BHH Suicide Risk Assessment (Signed)
Suicide Risk Assessment  Admission Assessment     Nursing information obtained from:    Demographic factors:    Current Mental Status:    Loss Factors:    Historical Factors:    Risk Reduction Factors:    Total Time spent with patient: 45 minutes  CLINICAL FACTORS:   Alcohol/Substance Abuse/Dependencies  Psychiatric Specialty Exam:     Blood pressure 135/90, pulse 102, temperature 98.3 F (36.8 C), temperature source Oral, resp. rate 18, height 6' (1.829 m), weight 80.74 kg (178 lb).Body mass index is 24.14 kg/(m^2).  General Appearance: Disheveled  Eye Sport and exercise psychologist::  Fair  Speech:  Clear and Coherent  Volume:  fluctuates  Mood:  Anxious, Depressed and tired  Affect:  Restricted  Thought Process:  Coherent and Goal Directed  Orientation:  Full (Time, Place, and Person)  Thought Content:  symptoms, worries, concerns  Suicidal Thoughts:  No  Homicidal Thoughts:  No  Memory:  Immediate;   Fair Recent;   Fair Remote;   Fair  Judgement:  Fair  Insight:  Present  Psychomotor Activity:  Decreased  Concentration:  Fair  Recall:  Poor  Fund of Knowledge:NA  Language: Poor  Akathisia:  No  Handed:    AIMS (if indicated):     Assets:  Desire for Improvement Housing  Sleep:  Number of Hours: 3.75   Musculoskeletal: Strength & Muscle Tone: decreased Gait & Station: unsteady, uses cane Patient leans: N/A  COGNITIVE FEATURES THAT CONTRIBUTE TO RISK:  Closed-mindedness Polarized thinking Thought constriction (tunnel vision)    SUICIDE RISK:   Moderate:  Frequent suicidal ideation with limited intensity, and duration, some specificity in terms of plans, no associated intent, good self-control, limited dysphoria/symptomatology, some risk factors present, and identifiable protective factors, including available and accessible social support.  PLAN OF CARE: Supportive approach/coping skills/relapse prevention                               [facility] detox protocol               Reassess and address the co morbidities  I certify that inpatient services furnished can reasonably be expected to improve the patient's condition.  Nathan Frank 02/02/2014, 4:15 PM

## 2014-02-02 NOTE — BHH Group Notes (Signed)
[facility] LCSW Aftercare Discharge Planning Group Note   02/02/2014 10:46 AM  Participation Quality:  DID NOT ATTEND-pt sleeping in room during d/c planning group.   White

## 2014-02-02 NOTE — Tx Team (Signed)
Interdisciplinary Treatment Plan Update (Adult)  Date: 02/02/2014   Time Reviewed: 11:45 AM  Progress in Treatment:  Attending groups: No.  Participating in groups:  No.  Taking medication as prescribed: Yes  Tolerating medication: Yes  Family/Significant othe contact made: No. SPE not required for this pt.   Patient understands diagnosis: Yes, AEB seeking treatment for ETOH detox and mood stabilization.  Discussing patient identified problems/goals with staff: Yes  Medical problems stabilized or resolved: Yes  Denies suicidal/homicidal ideation: Yes during admission/self report.  Patient has not harmed self or Others: Yes  New problem(s) identified:  Discharge Plan or Barriers: Pt not attending d/c planning group at this time. CSW assessing for appropriate referrals.  Additional comments: :73 yr male who presents VC in no acute distress for the treatment of ETOH Detox. Pt appears flat and depressed. Pt was calm and cooperative with admission process. Pt stated he's been drinking about 1/5 a day . Pt has a son and 2 daughters. The other son was killed 58 yrs ago. Pt wife lives in [place] now, because pt moved back to [place] to take care of his mother. Pt other son is in jail, but gets released June 4th, and pt wife "still babies him even though he is 79 yrs old, he's a grown man"   Reason for Continuation of Hospitalization: [facility] taper-withdrawals Mood stabilization  Estimated length of stay: 3-5 days  For review of initial/current patient goals, please see plan of care.  Attendees:  Patient:    Family:    Physician: Carlton Adam MD 02/02/2014 11:44 AM   Nursing: Rolena Infante RN  02/02/2014 11:44 AM   Clinical Social Worker [place], [place]  02/02/2014 11:44 AM   Other: Karsten Fells RN 02/02/2014 11:44 AM   Other: Gerline Legacy Nurse CM 02/02/2014 11:44 AM   Other:    Other:    Scribe for Treatment Team:  [place] LCSWA 02/02/2014 11:44 AM

## 2014-02-02 NOTE — Progress Notes (Signed)
Pt was noted to be a little un-steady on admission, so order for cane and pt to wear shoes with shoestrings was obtained from NP on call.   Admission Note:  D:72 yr male who presents VC in no acute distress for the treatment of ETOH Detox. Pt appears flat and depressed. Pt was calm and cooperative with admission process. Pt stated he's been drinking about 1/5 a day . Pt has a son and 2 daughters. The other son was killed 85 yrs ago. Pt wife lives in [place] now, because pt moved back to [place] to take care of his mother. Pt other son is in jail, but gets released June 4th, and pt wife "still babies him even though he is 56 yrs old, he's a grown man"   A:Skin was assessed and found to be clear of any abnormal marks apart from a scars on L-abdomen, L-hip/buttocks, bruises on L knee, pt has scratches over whole body from scratching self. POC and unit policies explained and understanding verbalized. Consents obtained. Food and fluids offered, and  Accepted.  R: Pt had no additional questions or concerns.

## 2014-02-02 NOTE — Tx Team (Signed)
Initial Interdisciplinary Treatment Plan  PATIENT STRENGTHS: (choose at least two) Capable of independent living General fund of knowledge Supportive family/friends  PATIENT STRESSORS: Health problems Marital or family conflict Substance abuse   PROBLEM LIST: Problem List/Patient Goals Date to be addressed Date deferred Reason deferred Estimated date of resolution  SA 02/02/14     Marital/ Family Problems 02/02/14                                                DISCHARGE CRITERIA:  Improved stabilization in mood, thinking, and/or behavior Verbal commitment to aftercare and medication compliance Withdrawal symptoms are absent or subacute and managed without 24-hour nursing intervention  PRELIMINARY DISCHARGE PLAN: Attend aftercare/continuing care group Attend PHP/IOP  PATIENT/FAMIILY INVOLVEMENT: This treatment plan has been presented to and reviewed with the patient, Nathan Frank.  The patient and family have been given the opportunity to ask questions and make suggestions.  Providence Crosby 02/02/2014, 1:46 AM

## 2014-02-02 NOTE — Progress Notes (Signed)
Adult Psychoeducational Group Note  Date:  02/02/2014 Time:  1:43 PM  Group Topic/Focus:  Early Warning Signs:   The focus of this group is to help patients identify signs or symptoms they exhibit before slipping into an unhealthy state or crisis.  Participation Level:  Did Not Attend  Additional Comments:  Pt was in bed not feeling well at the time of group.  Clint Bolder 02/02/2014, 1:43 PM

## 2014-02-03 DIAGNOSIS — F332 Major depressive disorder, recurrent severe without psychotic features: Secondary | ICD-10-CM

## 2014-02-03 DIAGNOSIS — F1910 Other psychoactive substance abuse, uncomplicated: Secondary | ICD-10-CM

## 2014-02-03 DIAGNOSIS — F1994 Other psychoactive substance use, unspecified with psychoactive substance-induced mood disorder: Secondary | ICD-10-CM

## 2014-02-03 DIAGNOSIS — F1010 Alcohol abuse, uncomplicated: Secondary | ICD-10-CM

## 2014-02-03 NOTE — Progress Notes (Signed)
Adult Psychoeducational Group Note  Date:  02/03/2014 Time:  5:30 PM  Group Topic/Focus:  Therapeutic Activity   Participation Level:  Did Not Attend   Elisha Headland 02/03/2014, 5:30 PM

## 2014-02-03 NOTE — BHH Group Notes (Signed)
[facility] Group Notes:  (Nursing/MHT/Case Management/Adjunct)  Date:  02/03/2014  Time: 0900 am  Type of Therapy:  Self Inventory Group  Participation Level:  Minimal  Participation Quality:  Inattentive  Affect:  Blunted and Flat  Cognitive:  Lacking  Insight:  Lacking  Engagement in Group:  Lacking  Modes of Intervention:  Socialization  Summary of Progress/Problems: Nathan Frank states he is "coming around.  He plans on attending AA/NA groups.  "I plan to meet new friends."  [name] [facility] 02/03/2014, 11:24 AM

## 2014-02-03 NOTE — Progress Notes (Signed)
D: Pt denies SI/HI/AVH. Pt is pleasant and cooperative. Pt felt better today he got to alk to his wife. Pt says he felt better because she knows he is safe now.   A: Pt was offered support and encouragement. Pt was given scheduled medications. Pt was encourage to attend groups. Q 15 minute checks were done for safety.   R:Pt attends groups and interacts well with peers and staff. Pt is taking medication. Pt has no complaints at this time.Pt receptive to treatment and safety maintained on unit.

## 2014-02-03 NOTE — BHH Group Notes (Signed)
[facility] LCSW Group Therapy  02/03/2014 12:25 PM  Type of Therapy:  Group Therapy  Participation Level:  Active  Participation Quality:  Appropriate, Sharing and Supportive  Affect:  Appropriate  Cognitive:  Alert, Appropriate and Oriented  Insight:  Developing/Improving, Engaged and Supportive  Engagement in Therapy:  Developing/Improving, Engaged and Supportive  Modes of Intervention:  Discussion, Education, Exploration, Rapport Building and Support  Summary of Progress/Problems: Pt was engaged in group discussion and was able to share unhealthy and healthy supports.  Pt was also able to identify an effective coping skills.  Pt was supportive of other's and engaged in other's sharing their experiences.   Katha Hamming 02/03/2014, 12:25 PM

## 2014-02-03 NOTE — Progress Notes (Signed)
[facility] MD Progress Note  02/03/2014 8:02 PM Nathan Frank  MRN:  [id number] Subjective:  Pt seen and chart reviewed. Pt denies SI, HI, and AVH, contracts for safety. Pt reports that he is doing much better and that his appetite and sleep are also doing well. 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 Pt reports pain in his left leg while trying to sleep. Ibuprofen ordered.     Diagnosis:   DSM5:  Substance/Addictive Disorders:  Alcohol Related Disorder - Severe (303.90) Depressive Disorders:  Major Depressive Disorder - Severe (296.23) Total Time spent with patient: 30 minutes  Axis I: Alcohol Abuse, Major Depression, Recurrent severe, Substance Abuse and Substance Induced Mood Disorder Axis II: Deferred Axis III:  Past Medical History  Diagnosis Date  . Neuropathy   . Hypertension   . Hepatitis C   . 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 Alcohol abuse    Axis IV: other psychosocial or environmental problems and problems related to social environment Axis V: 41-50 serious symptoms  ADL's:  Intact  Sleep: Good  Appetite:  Good  Suicidal Ideation:  Denies Homicidal Ideation:  Denies AEB (as evidenced by):  Psychiatric Specialty Exam: Physical Exam  Review of Systems  Constitutional: Negative.   HENT: Negative.   Eyes: Negative.   Respiratory: Negative.   Cardiovascular: Negative.   Gastrointestinal: Negative.   Genitourinary: Negative.   Musculoskeletal: Positive for joint pain and myalgias.  Skin: Negative.   Neurological: Negative.   Endo/Heme/Allergies: Negative.   Psychiatric/Behavioral: Positive for depression and substance abuse. The patient is nervous/anxious and has insomnia.     Blood pressure 122/82, pulse 96, temperature 98.2 F (36.8 C), temperature source Oral, resp. rate 19, height 6' (1.829 m), weight 80.74 kg (178 lb).Body mass index is 24.14 kg/(m^2).  General Appearance: Casual  Eye Contact::  Good  Speech:   Clear and Coherent  Volume:  Normal  Mood:  Anxious and Depressed  Affect:  Appropriate and Depressed  Thought Process:  Coherent and Goal Directed  Orientation:  Full (Time, Place, and Person)  Thought Content:  WDL  Suicidal Thoughts:  No  Homicidal Thoughts:  No  Memory:  Immediate;   Good Recent;   Good Remote;   Good  Judgement:  Fair  Insight:  Fair  Psychomotor Activity:  Normal  Concentration:  Good  Recall:  [place] of Knowledge:Good  Language: Good  Akathisia:  NA  Handed:  Right  AIMS (if indicated):     Assets:  Communication Skills Desire for Improvement Resilience  Sleep:  Number of Hours: 6.75   Musculoskeletal: Strength & Muscle Tone: within normal limits Gait & Station: normal Patient leans: N/A  Current Medications: Current Facility-Administered Medications  Medication Dose Route Frequency Provider Last Rate Last Dose  . acetaminophen (TYLENOL) tablet 650 mg  650 mg Oral Q6H PRN Shuvon Rankin, NP      . chlordiazePOXIDE (LIBRIUM) capsule 25 mg  25 mg Oral Q6H PRN Shuvon Rankin, NP      . [START ON 02/04/2014] chlordiazePOXIDE (LIBRIUM) capsule 25 mg  25 mg Oral BH-qamhs Shuvon Rankin, NP       Followed by  . [START ON 02/05/2014] chlordiazePOXIDE (LIBRIUM) capsule 25 mg  25 mg Oral Daily Shuvon Rankin, NP      . hydrOXYzine (ATARAX/VISTARIL) tablet 25 mg  25 mg Oral Q6H PRN Shuvon Rankin, NP   25 mg at 02/02/14 2125  . ibuprofen (  ADVIL,MOTRIN) tablet 600 mg  600 mg Oral Q6H PRN Lurena Nida, NP   600 mg at 02/03/14 1322  . loperamide (IMODIUM) capsule 2-4 mg  2-4 mg Oral PRN Shuvon Rankin, NP      . magnesium hydroxide (MILK OF MAGNESIA) suspension 30 mL  30 mL Oral Daily PRN Shuvon Rankin, NP      . multivitamin with minerals tablet 1 tablet  1 tablet Oral Daily Shuvon Rankin, NP   1 tablet at 02/03/14 0837  . ondansetron (ZOFRAN-ODT) disintegrating tablet 4 mg  4 mg Oral Q6H PRN Shuvon Rankin, NP      . pantoprazole (PROTONIX) EC tablet 40 mg  40 mg  Oral Daily Shuvon Rankin, NP   40 mg at 02/03/14 0837  . traMADol (ULTRAM) tablet 50 mg  50 mg Oral Q8H PRN Shuvon Rankin, NP   50 mg at 02/03/14 1617  . traZODone (DESYREL) tablet 100 mg  100 mg Oral QHS PRN Shuvon Rankin, NP   100 mg at 02/02/14 2125    Lab Results: No results found for this or any previous visit (from the past 48 hour(s)).  Physical Findings: AIMS: Facial and Oral Movements Muscles of Facial Expression: None, normal Lips and Perioral Area: None, normal Jaw: None, normal Tongue: None, normal,Extremity Movements Upper (arms, wrists, hands, fingers): None, normal Lower (legs, knees, ankles, toes): None, normal, Trunk Movements Neck, shoulders, hips: None, normal, Overall Severity Severity of abnormal movements (highest score from questions above): None, normal Incapacitation due to abnormal movements: None, normal Patient's awareness of abnormal movements (rate only patient's report): No Awareness, Dental Status Current problems with teeth and/or dentures?: Yes Does patient usually wear dentures?: Yes (top/ partial bottoms)  CIWA:  CIWA-Ar Total: 4 COWS:  COWS Total Score: 1  Treatment Plan Summary: Daily contact with patient to assess and evaluate symptoms and progress in treatment Medication management  Plan: Review of chart, vital signs, medications, and notes.  1-Individual and group therapy  2-Medication management for depression and anxiety: Medications reviewed with the patient and she stated no untoward effects, unchanged. 3-Coping skills for depression, anxiety  4-Continue crisis stabilization and management  5-Address health issues--monitoring vital signs, stable  6-Treatment plan in progress to prevent relapse of depression and anxiety  Medical Decision Making Problem Points:  Established problem, stable/improving (1), Review of last therapy session (1) and Review of psycho-social stressors (1) Data Points:  Review or order clinical lab tests  (1) Review or order medicine tests (1) Review of medication regiment & side effects (2) Review of new medications or change in dosage (2)  I certify that inpatient services furnished can reasonably be expected to improve the patient's condition.   Elyse Jarvis Withrow,FNP-BC 02/03/2014, 8:02 PM  Patient seen, evaluated and I agree with notes by Nurse Practitioner. Corena Pilgrim, MD

## 2014-02-03 NOTE — Progress Notes (Signed)
D: pt is calm and cooperative this morning. Pt c/o of knee pain. Pt stated he slept well last night. Pt rates pain 8/10. Pt no c/o any withdrawal symptoms.  A: tramadol and Advil given for pain. Pt stated he had no relief.PA notified of no change in pain. q 15 min safety checks. Support and encouragement offered R: pt remains safe on unit.no signs of distress noted

## 2014-02-03 NOTE — Progress Notes (Signed)
Pt did not attend group this evening.  

## 2014-02-03 NOTE — BHH Counselor (Signed)
Adult Comprehensive Assessment  Patient ID: Nathan Frank, male   DOB: Jun 04, 1941, 73 y.o.   MRN: [id number]  Information Source: Information source: Patient  Current Stressors:  Social relationships: Pt wife is currently in [place] awaiting sons release from prison in June.  She is currently living in [place] taking care of grandson. Substance abuse: ETOH abuse pt reports that he relapsed 2 years ago after being clean for 12 years  Living/Environment/Situation:  Living Arrangements: Non-relatives/Friends Living conditions (as described by patient or guardian): Pt reports that he lives in home alone How long has patient lived in current situation?: Pt has been living in his home for 5 years What is atmosphere in current home: Comfortable  Family History:  Marital status: Married Number of Years Married: 74 What types of issues is patient dealing with in the relationship?: Pt maintains loving and supportive relationship with wife Does patient have children?: Yes How many children?: 3 How is patient's relationship with their children?: Pt reports that he maintains loving and supportive relationships with all of his children  Childhood History:  By whom was/is the patient raised?: Both parents Description of patient's relationship with caregiver when they were a child: Pt reports that he maintained loving and supportive relationships with his parents during his childhood Patient's description of current relationship with people who raised him/her: Both parents are deceased Does patient have siblings?: Yes Number of Siblings: 2 Description of patient's current relationship with siblings: Pt report that he maintains a close relationship with one of his sisters and a distant relationship with the other. Did patient suffer any verbal/emotional/physical/sexual abuse as a child?: No Did patient suffer from severe childhood neglect?: No Has patient ever been sexually  abused/assaulted/raped as an adolescent or adult?: No Was the patient ever a victim of a crime or a disaster?: No Witnessed domestic violence?: No Has patient been effected by domestic violence as an adult?: No  Education:  Highest grade of school patient has completed: Pt he a Haematologist in Social Work Currently a Ship broker?: No Learning disability?: No  Employment/Work Situation:   Employment situation: Product manager job has been impacted by current illness: No What is the longest time patient has a held a job?: 6 years  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  Pt reports that he has not used heroin in 1 month. Pt reports that prior to this month he was using 1 bag daily. If attempted suicide, did drugs/alcohol play a role in this?: No Alcohol/Substance Abuse Treatment Hx: Past Tx, Inpatient If yes, describe treatment: Pt had inpatient treatment in the [facility]  September 2014 for 2 weeks. Has alcohol/substance abuse ever caused legal problems?: Yes  Social Support System:   Patient's Community Support System: Fair Describe Community Support System: Pt has positive familial supports in his siblings, children, and spouse. Type of faith/religion: Pt reports that he practices both Islam and Christianity How does patient's faith help to cope with current illness?: Prayer.  Leisure/Recreation:   Leisure and Hobbies: Pt enjoys listening to jazz, watching television, and reading  Strengths/Needs:  What things does the patient do well?: "I  try to tell the truth and I can easily make friends" In what areas does patient struggle / problems for patient: "Getting up and cooking"  Discharge Plan:   Does patient have access to transportation?: No Plan for no access to transportation at discharge: Pt does not drive. He will be assisted with transportation at DC. Will patient be returning to same living situation after discharge?: Yes Currently receiving community mental health services: No If no, would patient like referral for services when discharged?: Yes (What county[place] Sports coach) Does patient have financial barriers related to discharge medications?: No  Summary/Recommendations:   Summary and Recommendations (to be completed by the evaluator): Nathan Frank is an 73 y.o. male.  Patient is requesting detox from ETOH.  Currently drinking two fifths of wine daily for the last 6 months.  Patient last drank about 17:00 on 04/29.  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  Pt does admit to using heroin two weeks ago.  First time since that detox and he says, "I'm really disgusted with myself for doing it."  Patient says that he uses marijuana if someone has it, does not go out looking for it.  Recommendations for pt include: crisis stabilization, therapeutic milieu, encourage group attendance and participation, medication management for mood stabilization, and development of comprehensive mental wellness/sobriety plan.  Nathan Frank. 02/03/2014

## 2014-02-03 NOTE — Progress Notes (Signed)
D: Pt denies SI/HI/AVH. Pt is pleasant and cooperative. Pt stated everything ok. Pt only complaint is pain in legs, probably due to "a lot of walking".   A: Pt was offered support and encouragement. Pt was given scheduled medications. Pt was encourage to attend groups. Q 15 minute checks were done for safety.   R:Pt attends groups and interacts well with peers and staff. Pt is taking medication. Pt has no complaints.Pt receptive to treatment and safety maintained on unit.

## 2014-02-04 NOTE — Progress Notes (Signed)
D: Pt denies SI/HI/AVH. Pt is pleasant and cooperative. Pt stated he was feeling tired today, but felt better later. Pt said his wife will be moving down with him in [place] soon, so he was happy about that.  A: Pt was offered support and encouragement. Pt was given scheduled medications. Pt was encourage to attend groups. Q 15 minute checks were done for safety.   R:Pt attends groups and interacts well with peers and staff. Pt is taking medication. Pt has no complaints at this time.Pt receptive to treatment and safety maintained on unit.

## 2014-02-04 NOTE — BHH Group Notes (Signed)
[place] Group Notes: (Clinical Social Work)   02/04/2014      Type of Therapy:  Group Therapy   Participation Level:  Did Not Attend    Selmer Dominion, LCSW 02/04/2014, 12:50 PM

## 2014-02-04 NOTE — Progress Notes (Signed)
Patient ID: Nathan Frank, male   DOB: 29-Nov-1940, 73 y.o.   MRN: [id number]  D: Pt has been very flat and depressed on the unit today, he has been very isolative and has been in the bed all day. Pt did not attend any groups and has not engaged in treatment. Pt reported being negative SI/HI, no AH/VH noted. A: 15 min checks continued for patient safety. R: Pt safety maintained.

## 2014-02-04 NOTE — BHH Group Notes (Signed)
[facility] [place] [facility] Group Notes:  (Nursing/MHT/Case Management/Adjunct)  Date:  02/04/2014  Time:  11:32 AM  Type of Therapy:  Psychoeducational Skills  Participation Level:  Did Not Attend  Nathan Frank 02/04/2014, 11:32 AM

## 2014-02-04 NOTE — Progress Notes (Signed)
Patient ID: Nathan Frank, male   DOB: 14-Sep-1941, 73 y.o.   MRN: [id number] [facility] MD Progress Note  02/04/2014 6:32 PM Nathan Frank  MRN:  [id number] Subjective:  Pt seen and chart reviewed. Pt denies SI, HI, and AVH, contracts for safety. Pt reports that his left leg pain has improved, his sleep has improved, and that his appetite is doing well. Pt continues to be active in group sessions and is an advocate for others as well. Pt appears to be more positive today and states that he feels he has improved emotionally. Pt denies any physical and emotional complaints at this time.     Diagnosis:   DSM5:  Substance/Addictive Disorders:  Alcohol Related Disorder - Severe (303.90) Depressive Disorders:  Major Depressive Disorder - Severe (296.23) Total Time spent with patient: 30 minutes  Axis I: Alcohol Abuse, Major Depression, Recurrent severe, Substance Abuse and Substance Induced Mood Disorder Axis II: Deferred Axis III:  Past Medical History  Diagnosis Date  . Neuropathy   . Hypertension   . Hepatitis C   . 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 Alcohol abuse    Axis IV: other psychosocial or environmental problems and problems related to social environment Axis V: 41-50 serious symptoms  ADL's:  Intact  Sleep: Good  Appetite:  Good  Suicidal Ideation:  Denies Homicidal Ideation:  Denies AEB (as evidenced by):  Psychiatric Specialty Exam: Physical Exam  Review of Systems  Constitutional: Negative.   HENT: Negative.   Eyes: Negative.   Respiratory: Negative.   Cardiovascular: Negative.   Gastrointestinal: Negative.   Genitourinary: Negative.   Musculoskeletal: Positive for joint pain and myalgias.  Skin: Negative.   Neurological: Negative.   Endo/Heme/Allergies: Negative.   Psychiatric/Behavioral: Positive for depression and substance abuse. The patient is nervous/anxious and has insomnia.     Blood pressure 135/85, pulse 74, temperature 97.3 F (36.3 C), temperature source Oral,  resp. rate 18, height 6' (1.829 m), weight 80.74 kg (178 lb).Body mass index is 24.14 kg/(m^2).  General Appearance: Casual  Eye Contact::  Good  Speech:  Clear and Coherent  Volume:  Normal  Mood:  Anxious and Depressed  Affect:  Appropriate and Depressed  Thought Process:  Coherent and Goal Directed  Orientation:  Full (Time, Place, and Person)  Thought Content:  WDL  Suicidal Thoughts:  No  Homicidal Thoughts:  No  Memory:  Immediate;   Good Recent;   Good Remote;   Good  Judgement:  Fair  Insight:  Fair  Psychomotor Activity:  Normal  Concentration:  Good  Recall:  [place] of Knowledge:Good  Language: Good  Akathisia:  NA  Handed:  Right  AIMS (if indicated):     Assets:  Communication Skills Desire for Improvement Resilience  Sleep:  Number of Hours: 6.75   Musculoskeletal: Strength & Muscle Tone: within normal limits Gait & Station: normal Patient leans: N/A  Current Medications: Current Facility-Administered Medications  Medication Dose Route Frequency Provider Last Rate Last Dose  . acetaminophen (TYLENOL) tablet 650 mg  650 mg Oral Q6H PRN Shuvon Rankin, NP      . chlordiazePOXIDE (LIBRIUM) capsule 25 mg  25 mg Oral Q6H PRN Shuvon Rankin, NP   25 mg at 02/04/14 1118  . chlordiazePOXIDE (LIBRIUM) capsule 25 mg  25 mg Oral BH-qamhs Shuvon Rankin, NP       Followed by  . [START ON 02/05/2014] chlordiazePOXIDE (LIBRIUM) capsule 25 mg  25 mg Oral Daily Shuvon Rankin, NP      .  hydrOXYzine (ATARAX/VISTARIL) tablet 25 mg  25 mg Oral Q6H PRN Shuvon Rankin, NP   25 mg at 02/04/14 1117  . ibuprofen (ADVIL,MOTRIN) tablet 600 mg  600 mg Oral Q6H PRN Lurena Nida, NP   600 mg at 02/03/14 2126  . loperamide (IMODIUM) capsule 2-4 mg  2-4 mg Oral PRN Shuvon Rankin, NP      . magnesium hydroxide (MILK OF MAGNESIA) suspension 30 mL  30 mL Oral Daily PRN Shuvon Rankin, NP      . multivitamin with minerals tablet 1 tablet  1 tablet Oral Daily Shuvon Rankin, NP   1 tablet at  02/03/14 0837  . ondansetron (ZOFRAN-ODT) disintegrating tablet 4 mg  4 mg Oral Q6H PRN Shuvon Rankin, NP      . pantoprazole (PROTONIX) EC tablet 40 mg  40 mg Oral Daily Shuvon Rankin, NP   40 mg at 02/03/14 0837  . traMADol (ULTRAM) tablet 50 mg  50 mg Oral Q8H PRN Shuvon Rankin, NP   50 mg at 02/04/14 1117  . traZODone (DESYREL) tablet 100 mg  100 mg Oral QHS PRN Shuvon Rankin, NP   100 mg at 02/03/14 2127    Lab Results: No results found for this or any previous visit (from the past 48 hour(s)).  Physical Findings: AIMS: Facial and Oral Movements Muscles of Facial Expression: None, normal Lips and Perioral Area: None, normal Jaw: None, normal Tongue: None, normal,Extremity Movements Upper (arms, wrists, hands, fingers): None, normal Lower (legs, knees, ankles, toes): None, normal, Trunk Movements Neck, shoulders, hips: None, normal, Overall Severity Severity of abnormal movements (highest score from questions above): None, normal Incapacitation due to abnormal movements: None, normal Patient's awareness of abnormal movements (rate only patient's report): No Awareness, Dental Status Current problems with teeth and/or dentures?: Yes Does patient usually wear dentures?: Yes (top/ partial bottoms)  CIWA:  CIWA-Ar Total: 1 COWS:  COWS Total Score: 1  Treatment Plan Summary: Daily contact with patient to assess and evaluate symptoms and progress in treatment Medication management  Plan: Review of chart, vital signs, medications, and notes.  1-Individual and group therapy  2-Medication management for depression and anxiety: Medications reviewed with the patient and she stated no untoward effects, unchanged. 3-Coping skills for depression, anxiety  4-Continue crisis stabilization and management  5-Address health issues--monitoring vital signs, stable  6-Treatment plan in progress to prevent relapse of depression and anxiety  Medical Decision Making Problem Points:  Established  problem, stable/improving (1), Review of last therapy session (1) and Review of psycho-social stressors (1) Data Points:  Review or order clinical lab tests (1) Review or order medicine tests (1) Review of medication regiment & side effects (2) Review of new medications or change in dosage (2)  I certify that inpatient services furnished can reasonably be expected to improve the patient's condition.   Elyse Jarvis [place],FNP-BC 02/04/2014, 6:32 PM   Patient seen, evaluated and I agree with notes by Nurse Practitioner. Corena Pilgrim, MD

## 2014-02-04 NOTE — BHH Group Notes (Signed)
[facility] Group Notes:  (Nursing/MHT/Case Management/Adjunct)  Date:  02/04/2014  Time:  3:24 PM  Type of Therapy:  Psychoeducational Skills  Participation Level:  Did Not Attend  Quentin Angst Nathan Frank 02/04/2014, 3:24 PM

## 2014-02-05 DIAGNOSIS — F1020 Alcohol dependence, uncomplicated: Principal | ICD-10-CM

## 2014-02-05 MED ORDER — TRAMADOL HCL 50 MG PO TABS: 50.0000 mg | ORAL_TABLET | Freq: Three times a day (TID) | ORAL | Status: DC | PRN

## 2014-02-05 MED ORDER — TRAZODONE HCL 100 MG PO TABS: 100.0000 mg | ORAL_TABLET | Freq: Every evening | ORAL | Status: DC | PRN

## 2014-02-05 MED ORDER — OMEPRAZOLE 20 MG PO CPDR: 20.0000 mg | DELAYED_RELEASE_CAPSULE | Freq: Every day | ORAL | Status: DC

## 2014-02-05 NOTE — Progress Notes (Signed)
D:Pt presents with a mild tremor and anxiety. Pt c/o lt knee pain that he relates to arthritis. He is using a can on the unit. Pt has been out of his room interacting on the unit. A:Offered support. Encouraged fluids and gave prn medication for pain as ordered. Monitored q 15 minute checks.  R:Pt denies si and hi. Safety maintained on the unit.

## 2014-02-05 NOTE — Progress Notes (Signed)
Pt d/c from the hospital with a friend. All items returned. D/C instructions given, prescription given and samples given. Pt denies si and hi.

## 2014-02-05 NOTE — Tx Team (Signed)
Interdisciplinary Treatment Plan Update (Adult)  Date: 02/05/2014  Time Reviewed:  9:45 AM  Progress in Treatment: Attending groups: Yes Participating in groups:  Yes Taking medication as prescribed:  Yes Tolerating medication:  Yes Family/Significant othe contact made: No, N/A Patient understands diagnosis:  Yes Discussing patient identified problems/goals with staff:  Yes Medical problems stabilized or resolved:  Yes Denies suicidal/homicidal ideation: Yes Issues/concerns per patient self-inventory:  Yes Other:  New problem(s) identified: N/A  Discharge Plan or Barriers: Pt will follow up at the [facility] for medication management.    Reason for Continuation of Hospitalization: Stable to d/c today  Comments: N/A  Estimated length of stay: D/C today   For review of initial/current patient goals, please see plan of care.  Attendees: Patient:     Family:     Physician:  Dr. Sabra Heck 02/05/2014 10:13 AM   Nursing:   Maureen Chatters, RN 02/05/2014 10:13 AM   Clinical Social Worker:  Regan Lemming, LCSW 02/05/2014 10:13 AM   Other: Lars Pinks, RN case manager 02/05/2014 10:13 AM   Other:  Maxie Better, [place] 02/05/2014 10:13 AM   Other:  Desma Paganini, RN 02/05/2014 10:13 AM   Other:     Other:    Other:    Other:    Other:    Other:    Other:     Scribe for Treatment Team:   Ane Payment, 02/05/2014 , 10:13 AM

## 2014-02-05 NOTE — BHH Group Notes (Signed)
[place] LCSW Group Therapy  02/05/2014 1:15 PM   Type of Therapy:  Group Therapy  Participation Level:  Did Not Attend - pt getting ready in his room to d/c  Regan Lemming, LCSW 02/05/2014 2:45 PM

## 2014-02-05 NOTE — Progress Notes (Signed)
Adult Psychoeducational Group Note  Date:  02/05/2014 Time:  10:00 am  Group Topic/Focus:  Wellness Toolbox:   The focus of this group is to discuss various aspects of wellness, balancing those aspects and exploring ways to increase the ability to experience wellness.  Patients will create a wellness toolbox for use upon discharge.  Participation Level:  Did Not Attend   Nathan Frank 02/05/2014, 11:10 AM

## 2014-02-05 NOTE — Progress Notes (Signed)
[facility] Adult Case Management Discharge Plan :  Will you be returning to the same living situation after discharge: Yes,  returning home At discharge, do you have transportation home?:Yes,  friend will pick pt up Do you have the ability to pay for your medications:Yes,  provided pt with prescriptions and pt verbalizes ability to afford meds  Release of information consent forms completed and in the chart;  Patient's signature needed at discharge.  Patient to Follow up at: Follow-up Information   [facility] (The nurse will call you on this date between 8 am - 4 pm for a wellness check and to schedule your follow up appointment.)    Contact information:   [place], [place] [address] Phone: [phone] Fax: [phone]      Patient denies SI/HI:   Yes,  denies SI/HI    Safety Planning and Suicide Prevention discussed:  Yes,  discussed with pt.  N/A to contact family/friend due to no SI on admission. See suicide prevention education note.   Nathan Frank N Nathan Frank 02/05/2014, 11:01 AM

## 2014-02-05 NOTE — Discharge Summary (Signed)
Physician Discharge Summary Note  Patient:  Nathan Frank is an 73 y.o., male MRN:  [id number] DOB:  1941-01-25 Patient phone:  [phone] (home)  Patient address:   [address] [address],   Total Time spent with patient: Greater than 30 minutes  Date of Admission:  02/02/2014 Date of Discharge: 02/05/14  Reason for Admission:  Alcohol Frank  Discharge Diagnoses: Active Problems:   S/P alcohol detoxification   Alcohol dependence   Psychiatric Specialty Exam: Physical Exam  Psychiatric: His speech is normal and behavior is normal. Judgment and thought content normal. His mood appears not anxious. His affect is not angry, not blunt, not labile and not inappropriate. Cognition and memory are normal. He does not exhibit a depressed mood.    Review of Systems  Constitutional: Negative.   HENT: Negative.   Eyes: Negative.   Respiratory: Negative.   Cardiovascular: Negative.   Gastrointestinal: Negative.   Genitourinary: Negative.   Musculoskeletal: Negative.   Skin: Negative.   Neurological: Negative.   Endo/Heme/Allergies: Negative.   Psychiatric/Behavioral: Positive for substance abuse (Alcoholism, chronic). Negative for depression, suicidal ideas, hallucinations and memory loss. The patient has insomnia (Stabilized with medication prior to discharge). The patient is not nervous/anxious.     Blood pressure 123/80, pulse 89, temperature 98.4 F (36.9 C), temperature source Oral, resp. rate 18, height 6' (1.829 m), weight 80.74 kg (178 lb).Body mass index is 24.14 kg/(m^2).   General Appearance: Fairly Groomed   Engineer, water:: Fair   Speech: Clear and Coherent   Volume: Normal   Mood: Euthymic and ready to be D/C   Affect: Appropriate   Thought Process: Coherent and Goal Directed   Orientation: Full (Time, Place, and Person)   Thought Content: Relapse prevention plan   Suicidal Thoughts: No   Homicidal Thoughts: No   Memory: Immediate; Fair  Recent;  Fair  Remote; Fair   Judgement: Fair   Insight: Present   Psychomotor Activity: Restlessness   Concentration: Fair   Recall: Nathan Frank:NA   Language: Fair   Akathisia: No   Handed:   AIMS (if indicated):   Assets: Desire for Improvement  Social Support   Sleep: Number of Hours: 6.75    Past Psychiatric History: Diagnosis: Alcohol dependence  [facility]  [facility]  [facility]  Self-Mutilation: NA  Suicidal Attempts: NA  Violent Behaviors: NA   Musculoskeletal: Strength & Muscle Tone: within normal limits Gait & Station: unsteady Patient leans: unsteady balance, uses a cane  DSM5: Schizophrenia Disorders:  NA Obsessive-Compulsive Disorders:  NA Trauma-Stressor Disorders:  NA Substance/Addictive Disorders:  Alcohol Related Disorder - Severe (303.90) Depressive Disorders:  NA  Axis Diagnosis:  AXIS I:  Alcohol dependence AXIS II:  Deferred AXIS III:   Past Medical History  Diagnosis Date  . Neuropathy   . Hypertension   . Hepatitis C   . 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 Alcohol abuse    AXIS IV:  other psychosocial or environmental problems and Alcoholism, chronic AXIS V:  62  Level of Care:  OP  Hospital Course:  Pt is requesting Frank from ETOH. Drinking two 5th bottles daily of wine. Some recent heroin use and marijuana use.   Nathan Frank was admitted to the hospital with his toxicology reports showing blood alcohol level of 300. He was intoxicated requiring detoxification treatment. To achieve this, Nathan Frank was ordered and received Librium Frank protocols. He was also  medicated with Trazodone 100 mg Q bedtime for sleep. He was enrolled in the group counseling sessions and AA/NA meetings being offered on this unit. He learned coping skills. Nathan Frank was resumed in all his other pertinent home medications for his other medical issues. He tolerated his treatment regimen without any  significant adverse effects and or reactions reported.  Nathan Frank and his mood is stable. He is currently being discharged to continue substance abuse treatment/routine psychiatric care at the [facility] in [place], [place]. He is provided with all the pertinent information required to make this appointment without problems. Upon discharge, Nathan Frank adamantly denies any SIHI, AVH, delusional thoughts, paranoia and or withdrawal symptoms. He was provided with a 4 days worth, supply samples of his Nathan Frank discharge medications. He left Nathan Frank with all personal belongings in no apparent distress. Transportation per friend.  Consults:  psychiatry  Significant Diagnostic Studies:  labs: CBC with diff, CMP, UDS, toxicology tests, U/A  Discharge Vitals:   Blood pressure 123/80, pulse 89, temperature 98.4 F (36.9 C), temperature source Oral, resp. rate 18, height 6' (1.829 m), weight 80.74 kg (178 lb). Body mass index is 24.14 kg/(m^2). Lab Results:   No results found for this or any previous visit (from the past 72 hour(s)).  Physical Findings: AIMS: Facial and Oral Movements Muscles of Facial Expression: None, normal Lips and Perioral Area: None, normal Jaw: None, normal Tongue: None, normal,Extremity Movements Upper (arms, wrists, hands, fingers): None, normal Lower (legs, knees, ankles, toes): None, normal, Trunk Movements Neck, shoulders, hips: None, normal, Overall Severity Severity of abnormal movements (highest score from questions above): None, normal Incapacitation due to abnormal movements: None, normal Patient's awareness of abnormal movements (rate only patient's report): No Awareness, Dental Status Current problems with teeth and/or dentures?: Yes Does patient usually wear dentures?: Yes (top/ partial bottoms)  CIWA:  CIWA-Ar Total: 2 COWS:  COWS Total Score: 1  Psychiatric Specialty Exam: See Psychiatric Specialty Exam and Suicide Risk Assessment completed by  Attending Physician prior to discharge.  Discharge destination:  Home  Is patient on multiple antipsychotic therapies at discharge:  No   Has Patient had three or more failed trials of antipsychotic monotherapy by history:  No  Recommended Plan for Multiple Antipsychotic Therapies: NA     Medication List       Indication   omeprazole 20 MG capsule  Commonly known as:  PRILOSEC  Take 1 capsule (20 mg total) by mouth daily. For acid reflux   Indication:  Gastroesophageal Reflux Disease with Current Symptoms     traMADol 50 MG tablet  Commonly known as:  ULTRAM  Take 1 tablet (50 mg total) by mouth every 8 (eight) hours as needed for moderate pain.   Indication:  Moderate to Moderately Severe Pain     traZODone 100 MG tablet  Commonly known as:  DESYREL  Take 1 tablet (100 mg total) by mouth at bedtime as needed for sleep.   Indication:  Trouble Sleeping       Follow-up Information   [facility] (The nurse will call you on this date between 8 am - 4 pm for a wellness check and to schedule your follow up appointment.)    Contact information:   [place], [place] [address] Phone: [phone] Fax: [phone]     Follow-up recommendations:  Activity:  As tolerated Diet: As recommended by your primary care doctor. Keep all scheduled follow-up appointments as recommended.  Comments:  Take all your medications as prescribed by your mental healthcare provider. Report any adverse effects and or reactions from your medicines to your outpatient provider promptly. Patient is instructed and cautioned to not engage in alcohol and or illegal drug use while on prescription medicines. In the event of worsening symptoms, patient is instructed to call the crisis hotline, 911 and or go to the nearest ED for appropriate evaluation and treatment of symptoms. Follow-up with your primary care provider for your other medical issues, concerns and or health  care needs.   Total Discharge Time:  Greater than 30 minutes.  Signed: Encarnacion Slates, PMHNP, FNP-BC 02/05/2014, 3:39 PM Personally evaluated the patient and agree with assessment and plan Geralyn Flash A. [place].D.

## 2014-02-05 NOTE — BHH Suicide Risk Assessment (Signed)
Suicide Risk Assessment  Discharge Assessment     Demographic Factors:  Male and Age 74 or older  Total Time spent with patient: 45 minutes  Psychiatric Specialty Exam:     Blood pressure 123/80, pulse 89, temperature 98.4 F (36.9 C), temperature source Oral, resp. rate 18, height 6' (1.829 m), weight 80.74 kg (178 lb).Body mass index is 24.14 kg/(m^2).  General Appearance: Fairly Groomed  Engineer, water::  Fair  Speech:  Clear and Coherent  Volume:  Normal  Mood:  Euthymic and ready to be D/C  Affect:  Appropriate  Thought Process:  Coherent and Goal Directed  Orientation:  Full (Time, Place, and Person)  Thought Content:  Relapse prevention plan  Suicidal Thoughts:  No  Homicidal Thoughts:  No  Memory:  Immediate;   Fair Recent;   Fair Remote;   Fair  Judgement:  Fair  Insight:  Present  Psychomotor Activity:  Restlessness  Concentration:  Fair  Recall:  [employer] of Knowledge:NA  Language: Fair  Akathisia:  No  Handed:    AIMS (if indicated):     Assets:  Desire for Improvement Social Support  Sleep:  Number of Hours: 6.75    Musculoskeletal: Strength & Muscle Tone: within normal limits Gait & Station: uses cane Patient leans: N/A   Mental Status Per Nursing Assessment::   On Admission:     Current Mental Status by Physician: In full contact with reality. There are no active S/S of withdrawal. There are no active SI plans or intent   Loss Factors: Decline in physical health  Historical Factors: NA  Risk Reduction Factors:   Sense of responsibility to family, Living with another person, especially a relative and Positive social support  Continued Clinical Symptoms:  Alcohol/Substance Abuse/Dependencies  Cognitive Features That Contribute To Risk:  Closed-mindedness Polarized thinking Thought constriction (tunnel vision)    Suicide Risk:  Minimal: No identifiable suicidal ideation.  Patients presenting with no risk factors but with morbid  ruminations; may be classified as minimal risk based on the severity of the depressive symptoms  Discharge Diagnoses:   AXIS I:  Alcohol Dependence, Substance Induced Mood Disorder AXIS II:  No diagnosis AXIS III:   Past Medical History  Diagnosis Date  . Neuropathy   . Hypertension   . Hepatitis C   . 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 Alcohol abuse    AXIS IV:  other psychosocial or environmental problems AXIS V:  61-70 mild symptoms  Plan Of Care/Follow-up recommendations:  Activity:  as tolerated Diet:  regular Follow up outpatient basis Is patient on multiple antipsychotic therapies at discharge:  No   Has Patient had three or more failed trials of antipsychotic monotherapy by history:  No  Recommended Plan for Multiple Antipsychotic Therapies: NA    Nicholaus Bloom 02/05/2014, 1:48 PM

## 2014-02-05 NOTE — BHH Group Notes (Signed)
[facility] LCSW Aftercare Discharge Planning Group Note   02/05/2014  8:45 AM  Participation Quality:  Did Not Attend - pt sleeping in his room  Regan Lemming, [place] 02/05/2014 9:40 AM

## 2014-02-09 NOTE — Progress Notes (Signed)
Patient Discharge Instructions:  After Visit Summary (AVS):   Faxed to:  02/09/14 Discharge Summary Note:   Faxed to:  02/09/14 Psychiatric Admission Assessment Note:   Faxed to:  02/09/14 Suicide Risk Assessment - Discharge Assessment:   Faxed to:  02/09/14 Faxed/Sent to the Next Level Care provider:  02/09/14 Faxed to [facility] @ [phone]  Patsey Berthold, 02/09/2014, 2:04 PM

## 2014-02-13 ENCOUNTER — Emergency Department (HOSPITAL_COMMUNITY): Payer: Medicare Other

## 2014-02-13 ENCOUNTER — Emergency Department (HOSPITAL_COMMUNITY)
Admission: EM | Admit: 2014-02-13 | Discharge: 2014-02-13 | Disposition: A | Discharge status: Home / Self Care | Payer: Medicare Other | Attending: Emergency Medicine | Admitting: Emergency Medicine

## 2014-02-13 ENCOUNTER — Encounter (HOSPITAL_COMMUNITY): Payer: Self-pay | Admitting: Emergency Medicine

## 2014-02-13 DIAGNOSIS — R1110 Vomiting, unspecified: Secondary | ICD-10-CM | POA: Insufficient documentation

## 2014-02-13 DIAGNOSIS — F1020 Alcohol dependence, uncomplicated: Secondary | ICD-10-CM

## 2014-02-13 DIAGNOSIS — Z8669 Personal history of other diseases of the nervous system and sense organs: Secondary | ICD-10-CM | POA: Insufficient documentation

## 2014-02-13 DIAGNOSIS — M542 Cervicalgia: Secondary | ICD-10-CM | POA: Insufficient documentation

## 2014-02-13 DIAGNOSIS — Z79899 Other long term (current) drug therapy: Secondary | ICD-10-CM | POA: Insufficient documentation

## 2014-02-13 DIAGNOSIS — R5381 Other malaise: Secondary | ICD-10-CM | POA: Insufficient documentation

## 2014-02-13 DIAGNOSIS — Z8619 Personal history of other infectious and parasitic diseases: Secondary | ICD-10-CM | POA: Insufficient documentation

## 2014-02-13 DIAGNOSIS — I10 Essential (primary) hypertension: Secondary | ICD-10-CM | POA: Insufficient documentation

## 2014-02-13 DIAGNOSIS — R5383 Other fatigue: Secondary | ICD-10-CM

## 2014-02-13 DIAGNOSIS — Z8719 Personal history of other diseases of the digestive system: Secondary | ICD-10-CM | POA: Insufficient documentation

## 2014-02-13 DIAGNOSIS — Z87891 Personal history of nicotine dependence: Secondary | ICD-10-CM | POA: Insufficient documentation

## 2014-02-13 LAB — CBC WITH DIFFERENTIAL/PLATELET
Basophils Absolute: 0 10*3/uL (ref 0.0–0.1)
Basophils Relative: 0 % (ref 0–1)
Eosinophils Absolute: 0 10*3/uL (ref 0.0–0.7)
Eosinophils Relative: 0 % (ref 0–5)
HEMATOCRIT: 32.5 % — AB (ref 39.0–52.0)
HEMOGLOBIN: 11.3 g/dL — AB (ref 13.0–17.0)
Lymphocytes Relative: 28 % (ref 12–46)
Lymphs Abs: 1 10*3/uL (ref 0.7–4.0)
MCH: 34 pg (ref 26.0–34.0)
MCHC: 34.8 g/dL (ref 30.0–36.0)
MCV: 97.9 fL (ref 78.0–100.0)
MONO ABS: 0.5 10*3/uL (ref 0.1–1.0)
MONOS PCT: 16 % — AB (ref 3–12)
NEUTROS ABS: 1.9 10*3/uL (ref 1.7–7.7)
Neutrophils Relative %: 55 % (ref 43–77)
Platelets: 123 10*3/uL — ABNORMAL LOW (ref 150–400)
RBC: 3.32 MIL/uL — ABNORMAL LOW (ref 4.22–5.81)
RDW: 16.3 % — ABNORMAL HIGH (ref 11.5–15.5)
WBC: 3.4 10*3/uL — ABNORMAL LOW (ref 4.0–10.5)

## 2014-02-13 LAB — COMPREHENSIVE METABOLIC PANEL
ALT: 17 U/L (ref 0–53)
AST: 45 U/L — ABNORMAL HIGH (ref 0–37)
Albumin: 2.7 g/dL — ABNORMAL LOW (ref 3.5–5.2)
Alkaline Phosphatase: 46 U/L (ref 39–117)
BUN: 5 mg/dL — AB (ref 6–23)
CHLORIDE: 107 meq/L (ref 96–112)
CO2: 21 mEq/L (ref 19–32)
CREATININE: 0.58 mg/dL (ref 0.50–1.35)
Calcium: 7.4 mg/dL — ABNORMAL LOW (ref 8.4–10.5)
GFR calc Af Amer: >90 mL/min (ref >90)
GFR calc non Af Amer: >90 mL/min (ref >90)
Glucose, Bld: 100 mg/dL — ABNORMAL HIGH (ref 70–99)
Potassium: 3.5 mEq/L — ABNORMAL LOW (ref 3.7–5.3)
Sodium: 141 mEq/L (ref 137–147)
Total Bilirubin: 0.8 mg/dL (ref 0.3–1.2)
Total Protein: 6.2 g/dL (ref 6.0–8.3)

## 2014-02-13 LAB — URINALYSIS, ROUTINE W REFLEX MICROSCOPIC
BILIRUBIN URINE: NEGATIVE
Glucose, UA: NEGATIVE mg/dL
HGB URINE DIPSTICK: NEGATIVE
Ketones, ur: NEGATIVE mg/dL
Leukocytes, UA: NEGATIVE
Nitrite: NEGATIVE
PH: 7 (ref 5.0–8.0)
Protein, ur: NEGATIVE mg/dL
Specific Gravity, Urine: 1.015 (ref 1.005–1.030)
Urobilinogen, UA: 0.2 mg/dL (ref 0.0–1.0)

## 2014-02-13 LAB — ETHANOL: Alcohol, Ethyl (B): <11 mg/dL (ref 0–11)

## 2014-02-13 MED ORDER — ONDANSETRON HCL 4 MG/2ML IJ SOLN
4.0000 mg | Freq: Once | INTRAMUSCULAR | Status: AC
  Administered 2014-02-13: 4 mg via INTRAVENOUS
  Filled 2014-02-13: qty 2

## 2014-02-13 MED ORDER — CHLORDIAZEPOXIDE HCL 10 MG PO CAPS: 10.0000 mg | ORAL_CAPSULE | Freq: Three times a day (TID) | ORAL | Status: DC | PRN

## 2014-02-13 MED ORDER — SODIUM CHLORIDE 0.9 % IV SOLN
INTRAVENOUS | Status: DC
  Administered 2014-02-13: 18:00:00 via INTRAVENOUS

## 2014-02-13 MED ORDER — SODIUM CHLORIDE 0.9 % IV BOLUS (SEPSIS)
1000.0000 mL | Freq: Once | INTRAVENOUS | Status: AC
Start: 2014-02-13 — End: 2014-02-13
  Administered 2014-02-13: 1000 mL via INTRAVENOUS

## 2014-02-13 MED ORDER — SODIUM CHLORIDE 0.9 % IV SOLN: INTRAVENOUS | Status: DC

## 2014-02-13 NOTE — ED Notes (Addendum)
Per patient-c/o HA, neck pain, and generalized right side pain x2 days. Has fallen x5 in the last 6 months but denies that these falls are related to alcohol use.  Emesis x3 today-denies blood. Reports diarrhea today x3. Denies blood. Endorses chills but no fever. Per patient-nothing makes pain better or worse. Reports being here for alcohol detox last week. Drank last drink yesterday. Averages a pint of wine a day. Tried to drink juice this morning and resulted in vomiting. Hasn't been eating or drinking like normal. No other complaints at this time. In NAD. Awaiting MD.

## 2014-02-13 NOTE — ED Provider Notes (Signed)
CSN: [id number]     Arrival date & time 02/13/14  1624 History   First MD Initiated Contact with Patient 02/13/14 1656     Chief Complaint  Patient presents with  . Headache     (Consider location/radiation/quality/duration/timing/severity/associated sxs/prior Treatment) Patient is a 73 y.o. male presenting with headaches. The history is provided by the patient.  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 Neuropathy   . Hypertension   . Hepatitis C   . 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 Alcohol abuse    Past Surgical History  Procedure Laterality Date  . Esophagogastroduodenoscopy  06/07/2012    Procedure: ESOPHAGOGASTRODUODENOSCOPY (EGD);  Surgeon: Milus Banister, MD;  Location: [place];  Service: Endoscopy;  Laterality: N/A;  may need treatment of varices   Family History  Problem Relation Age of Onset  . Stroke Father   . Prostate cancer Brother    History  Substance Use Topics  . Smoking status: Former Research scientist (life sciences)  . Smokeless tobacco: Not on file  . Alcohol Use: Yes     Comment:  two fifths of wine per day    Review of Systems  Neurological: Positive for headaches.  All other systems reviewed and are negative.     Allergies  Review of patient's allergies indicates no known allergies.  Home Medications   Prior to Admission medications   Medication Sig Start Date End Date Taking? Authorizing Provider  omeprazole (PRILOSEC) 20 MG capsule Take 1 capsule (20 mg total) by mouth daily. For acid reflux 02/05/14   Encarnacion Slates, NP  traMADol (ULTRAM) 50 MG tablet Take 1 tablet (50 mg total) by  mouth every 8 (eight) hours as needed for moderate pain. 02/05/14   Encarnacion Slates, NP  traZODone (DESYREL) 100 MG tablet Take 1 tablet (100 mg total) by mouth at bedtime as needed for sleep. 02/05/14   Encarnacion Slates, NP   BP 163/69  Pulse 72  Temp(Src) 98.9 F (37.2 C) (Oral)  Resp 16  SpO2 99% Physical Exam  Nursing note and vitals reviewed. Constitutional: He is oriented to person, place, and time. He appears well-developed and well-nourished.  Non-toxic appearance. No distress.  HENT:  Head: Normocephalic and atraumatic.  Eyes: Conjunctivae, EOM and lids are normal. Pupils are equal, round, and reactive to light.  Neck: Normal range of motion. Neck supple. No tracheal deviation present. No mass present.    Cardiovascular: Normal rate, regular rhythm and normal heart sounds.  Exam reveals no gallop.   No murmur heard. Pulmonary/Chest: Effort normal and breath sounds normal. No stridor. No respiratory distress. He has no decreased breath sounds. He has no wheezes. He has no rhonchi. He has no rales.    Abdominal: Soft. Normal appearance and bowel sounds are normal. He exhibits no distension. There is no tenderness. There is no rebound and no CVA tenderness.  Musculoskeletal: Normal range of motion. He exhibits no edema and no tenderness.  Neurological: He is alert and oriented to person, place, and time. He has normal strength. No cranial nerve deficit or sensory deficit. GCS eye subscore is 4. GCS verbal subscore is  5. GCS motor subscore is 6.  Skin: Skin is warm and dry. No abrasion and no rash noted.  Psychiatric: He has a normal mood and affect. His speech is normal and behavior is normal.    ED Course  Procedures (including critical care time) Labs Review Labs Reviewed  CBC WITH DIFFERENTIAL  COMPREHENSIVE METABOLIC PANEL    Imaging Review No results found.   EKG Interpretation   Date/Time:  Tuesday Feb 13 2014 17:41:04 EDT Ventricular Rate:  70 PR Interval:  125 QRS  Duration: 150 QT Interval:  480 QTC Calculation: 518 R Axis:   61 Text Interpretation:  Sinus rhythm Atrial premature complexes in couplets  Right bundle branch block No significant change since last tracing  Confirmed by [facility]  MD, Maryem Shuffler ([id number]) on 02/13/2014 6:56:35 PM      MDM   Final diagnoses:  None   Patient without evidence of acute injury here. He has refused his rib x-rays. He has no crepitus on exam. No bruising noted. Patient be given 2 days with medications for alcohol detox. He'll followup with AA     Leota Jacobsen, MD 02/13/14 1905

## 2014-02-13 NOTE — ED Notes (Signed)
Per EMS: Pt c/o headache x 2 days.  Noncompliant w/ htn meds x several months.  NV this morning.  Not currently nauseated or vomiting.  Now c/o rt sided pain d/t a fall several days ago.  Also having neck pain from same fall.

## 2014-02-13 NOTE — ED Notes (Signed)
Pt not in room to obtain blood 

## 2014-02-13 NOTE — ED Notes (Signed)
Bed: WA17 Expected date:  Expected time:  Means of arrival:  Comments: headache

## 2014-02-13 NOTE — Discharge Instructions (Signed)
Alcohol Problems °Most adults who drink alcohol drink in moderation (not a lot) are at low risk for developing problems related to their drinking. However, all drinkers, including low-risk drinkers, should know about the health risks connected with drinking alcohol. °RECOMMENDATIONS FOR LOW-RISK DRINKING  °Drink in moderation. Moderate drinking is defined as follows:  °· Men - no more than 2 drinks per day. °· Nonpregnant women - no more than 1 drink per day. °· Over age 65 - no more than 1 drink per day. °A standard drink is 12 grams of pure alcohol, which is equal to a 12 ounce bottle of beer or wine cooler, a 5 ounce glass of wine, or 1.5 ounces of distilled spirits (such as whiskey, brandy, vodka, or rum).  °ABSTAIN FROM (DO NOT DRINK) ALCOHOL: °· When pregnant or considering pregnancy. °· When taking a medication that interacts with alcohol. °· If you are alcohol dependent. °· A medical condition that prohibits drinking alcohol (such as ulcer, liver disease, or heart disease). °DISCUSS WITH YOUR CAREGIVER: °· If you are at risk for coronary heart disease, discuss the potential benefits and risks of alcohol use: Light to moderate drinking is associated with lower rates of coronary heart disease in certain populations (for example, men over age 45 and postmenopausal women). Infrequent or nondrinkers are advised not to begin light to moderate drinking to reduce the risk of coronary heart disease so as to avoid creating an alcohol-related problem. Similar protective effects can likely be gained through proper diet and exercise. °· Women and the elderly have smaller amounts of body water than men. As a result women and the elderly achieve a higher blood alcohol concentration after drinking the same amount of alcohol. °· Exposing a fetus to alcohol can cause a broad range of birth defects referred to as Fetal Alcohol Syndrome (FAS) or Alcohol-Related Birth Defects (ARBD). Although FAS/ARBD is connected with excessive  alcohol consumption during pregnancy, studies also have reported neurobehavioral problems in infants born to mothers reporting drinking an average of 1 drink per day during pregnancy. °· Heavier drinking (the consumption of more than 4 drinks per occasion by men and more than 3 drinks per occasion by women) impairs learning (cognitive) and psychomotor functions and increases the risk of alcohol-related problems, including accidents and injuries. °CAGE QUESTIONS:  °· Have you ever felt that you should Cut down on your drinking? °· Have people Annoyed you by criticizing your drinking? °· Have you ever felt bad or Guilty about your drinking? °· Have you ever had a drink first thing in the morning to steady your nerves or get rid of a hangover (Eye opener)? °If you answered positively to any of these questions: You may be at risk for alcohol-related problems if alcohol consumption is:  °· Men: Greater than 14 drinks per week or more than 4 drinks per occasion. °· Women: Greater than 7 drinks per week or more than 3 drinks per occasion. °Do you or your family have a medical history of alcohol-related problems, such as: °· Blackouts. °· Sexual dysfunction. °· Depression. °· Trauma. °· Liver dysfunction. °· Sleep disorders. °· Hypertension. °· Chronic abdominal pain. °· Has your drinking ever caused you problems, such as problems with your family, problems with your work (or school) performance, or accidents/injuries? °· Do you have a compulsion to drink or a preoccupation with drinking? °· Do you have poor control or are you unable to stop drinking once you have started? °· Do you have to drink to   avoid withdrawal symptoms? °· Do you have problems with withdrawal such as tremors, nausea, sweats, or mood disturbances? °· Does it take more alcohol than in the past to get you high? °· Do you feel a strong urge to drink? °· Do you change your plans so that you can have a drink? °· Do you ever drink in the morning to relieve  the shakes or a hangover? °If you have answered a number of the previous questions positively, it may be time for you to talk to your caregivers, family, and friends and see if they think you have a problem. Alcoholism is a chemical dependency that keeps getting worse and will eventually destroy your health and relationships. Many alcoholics end up dead, impoverished, or in prison. This is often the end result of all chemical dependency. °· Do not be discouraged if you are not ready to take action immediately. °· Decisions to change behavior often involve up and down desires to change and feeling like you cannot decide. °· Try to think more seriously about your drinking behavior. °· Think of the reasons to quit. °WHERE TO GO FOR ADDITIONAL INFORMATION  °· The National Institute on Alcohol Abuse and Alcoholism (NIAAA) °www.niaaa.nih.gov °· National Council on Alcoholism and Drug Dependence (NCADD) °www.ncadd.org °· American Society of Addiction Medicine (ASAM) °www.asam.org  °Document Released: 09/21/2005 Document Revised: 12/14/2011 Document Reviewed: 05/09/2008 °ExitCare® Patient Information ©2014 [employer]. ° °

## 2014-02-20 ENCOUNTER — Inpatient Hospital Stay (HOSPITAL_COMMUNITY)
Admission: EM | Admit: 2014-02-20 | Discharge: 2014-02-22 | DRG: 918 | Disposition: A | Discharge status: Home / Self Care | Payer: Medicare Other | Attending: Internal Medicine | Admitting: Internal Medicine

## 2014-02-20 ENCOUNTER — Emergency Department (HOSPITAL_COMMUNITY): Payer: Medicare Other

## 2014-02-20 ENCOUNTER — Encounter (HOSPITAL_COMMUNITY): Payer: Self-pay | Admitting: Emergency Medicine

## 2014-02-20 DIAGNOSIS — B370 Candidal stomatitis: Secondary | ICD-10-CM

## 2014-02-20 DIAGNOSIS — Z9289 Personal history of other medical treatment: Secondary | ICD-10-CM

## 2014-02-20 DIAGNOSIS — R634 Abnormal weight loss: Secondary | ICD-10-CM

## 2014-02-20 DIAGNOSIS — T401X1A Poisoning by heroin, accidental (unintentional), initial encounter: Secondary | ICD-10-CM | POA: Diagnosis present

## 2014-02-20 DIAGNOSIS — K8020 Calculus of gallbladder without cholecystitis without obstruction: Secondary | ICD-10-CM

## 2014-02-20 DIAGNOSIS — R109 Unspecified abdominal pain: Secondary | ICD-10-CM

## 2014-02-20 DIAGNOSIS — F1210 Cannabis abuse, uncomplicated: Secondary | ICD-10-CM | POA: Diagnosis present

## 2014-02-20 DIAGNOSIS — I82409 Acute embolism and thrombosis of unspecified deep veins of unspecified lower extremity: Secondary | ICD-10-CM

## 2014-02-20 DIAGNOSIS — F329 Major depressive disorder, single episode, unspecified: Secondary | ICD-10-CM

## 2014-02-20 DIAGNOSIS — F1020 Alcohol dependence, uncomplicated: Secondary | ICD-10-CM

## 2014-02-20 DIAGNOSIS — K7030 Alcoholic cirrhosis of liver without ascites: Secondary | ICD-10-CM | POA: Diagnosis present

## 2014-02-20 DIAGNOSIS — I4891 Unspecified atrial fibrillation: Secondary | ICD-10-CM | POA: Diagnosis present

## 2014-02-20 DIAGNOSIS — I472 Ventricular tachycardia, unspecified: Secondary | ICD-10-CM | POA: Diagnosis present

## 2014-02-20 DIAGNOSIS — I10 Essential (primary) hypertension: Secondary | ICD-10-CM | POA: Diagnosis present

## 2014-02-20 DIAGNOSIS — B1920 Unspecified viral hepatitis C without hepatic coma: Secondary | ICD-10-CM

## 2014-02-20 DIAGNOSIS — K2970 Gastritis, unspecified, without bleeding: Secondary | ICD-10-CM

## 2014-02-20 DIAGNOSIS — F1010 Alcohol abuse, uncomplicated: Secondary | ICD-10-CM | POA: Diagnosis present

## 2014-02-20 DIAGNOSIS — D649 Anemia, unspecified: Secondary | ICD-10-CM

## 2014-02-20 DIAGNOSIS — R319 Hematuria, unspecified: Secondary | ICD-10-CM

## 2014-02-20 DIAGNOSIS — F32A Depression, unspecified: Secondary | ICD-10-CM

## 2014-02-20 DIAGNOSIS — Z09 Encounter for follow-up examination after completed treatment for conditions other than malignant neoplasm: Secondary | ICD-10-CM

## 2014-02-20 DIAGNOSIS — T401X4A Poisoning by heroin, undetermined, initial encounter: Principal | ICD-10-CM | POA: Diagnosis present

## 2014-02-20 DIAGNOSIS — I4729 Other ventricular tachycardia: Secondary | ICD-10-CM | POA: Diagnosis present

## 2014-02-20 DIAGNOSIS — K7460 Unspecified cirrhosis of liver: Secondary | ICD-10-CM

## 2014-02-20 HISTORY — DX: Poisoning by heroin, accidental (unintentional), initial encounter: T40.1X1A

## 2014-02-20 LAB — CBC WITH DIFFERENTIAL/PLATELET
BASOS ABS: 0 10*3/uL (ref 0.0–0.1)
Basophils Relative: 1 % (ref 0–1)
Eosinophils Absolute: 0 10*3/uL (ref 0.0–0.7)
Eosinophils Relative: 1 % (ref 0–5)
HEMATOCRIT: 40.2 % (ref 39.0–52.0)
Hemoglobin: 13.8 g/dL (ref 13.0–17.0)
Lymphocytes Relative: 36 % (ref 12–46)
Lymphs Abs: 1.3 10*3/uL (ref 0.7–4.0)
MCH: 34.3 pg — ABNORMAL HIGH (ref 26.0–34.0)
MCHC: 34.3 g/dL (ref 30.0–36.0)
MCV: 100 fL (ref 78.0–100.0)
MONOS PCT: 15 % — AB (ref 3–12)
Monocytes Absolute: 0.6 10*3/uL (ref 0.1–1.0)
NEUTROS ABS: 1.7 10*3/uL (ref 1.7–7.7)
Neutrophils Relative %: 47 % (ref 43–77)
Platelets: 146 10*3/uL — ABNORMAL LOW (ref 150–400)
RBC: 4.02 MIL/uL — AB (ref 4.22–5.81)
RDW: 16.1 % — AB (ref 11.5–15.5)
WBC: 3.6 10*3/uL — ABNORMAL LOW (ref 4.0–10.5)

## 2014-02-20 LAB — COMPREHENSIVE METABOLIC PANEL
ALT: 17 U/L (ref 0–53)
AST: 55 U/L — AB (ref 0–37)
Albumin: 3.3 g/dL — ABNORMAL LOW (ref 3.5–5.2)
Alkaline Phosphatase: 68 U/L (ref 39–117)
BILIRUBIN TOTAL: 0.6 mg/dL (ref 0.3–1.2)
BUN: 10 mg/dL (ref 6–23)
CHLORIDE: 105 meq/L (ref 96–112)
CO2: 24 meq/L (ref 19–32)
CREATININE: 0.99 mg/dL (ref 0.50–1.35)
Calcium: 8.8 mg/dL (ref 8.4–10.5)
GFR, EST NON AFRICAN AMERICAN: 79 mL/min — AB (ref >90)
Glucose, Bld: 109 mg/dL — ABNORMAL HIGH (ref 70–99)
Potassium: 3.8 mEq/L (ref 3.7–5.3)
Sodium: 142 mEq/L (ref 137–147)
Total Protein: 7.5 g/dL (ref 6.0–8.3)

## 2014-02-20 LAB — URINALYSIS, ROUTINE W REFLEX MICROSCOPIC
Glucose, UA: NEGATIVE mg/dL
Ketones, ur: NEGATIVE mg/dL
Leukocytes, UA: NEGATIVE
Nitrite: NEGATIVE
PROTEIN: 30 mg/dL — AB
Specific Gravity, Urine: 1.022 (ref 1.005–1.030)
UROBILINOGEN UA: 0.2 mg/dL (ref 0.0–1.0)
pH: 5.5 (ref 5.0–8.0)

## 2014-02-20 LAB — URINE MICROSCOPIC-ADD ON

## 2014-02-20 LAB — RAPID URINE DRUG SCREEN, HOSP PERFORMED
Amphetamines: NOT DETECTED
Barbiturates: NOT DETECTED
Benzodiazepines: POSITIVE — AB
Cocaine: NOT DETECTED
Opiates: POSITIVE — AB
TETRAHYDROCANNABINOL: NOT DETECTED

## 2014-02-20 LAB — ACETAMINOPHEN LEVEL: Acetaminophen (Tylenol), Serum: <15 ug/mL (ref 10–30)

## 2014-02-20 LAB — CBG MONITORING, ED: GLUCOSE-CAPILLARY: 103 mg/dL — AB (ref 70–99)

## 2014-02-20 LAB — ETHANOL

## 2014-02-20 MED ORDER — THIAMINE HCL 100 MG/ML IJ SOLN
Freq: Once | INTRAVENOUS | Status: AC
  Administered 2014-02-20: via INTRAVENOUS
  Filled 2014-02-20: qty 1000

## 2014-02-20 MED ORDER — CHLORDIAZEPOXIDE HCL 5 MG PO CAPS: 10.0000 mg | ORAL_CAPSULE | Freq: Every day | ORAL | Status: DC

## 2014-02-20 MED ORDER — PANTOPRAZOLE SODIUM 40 MG PO TBEC
80.0000 mg | DELAYED_RELEASE_TABLET | Freq: Every day | ORAL | Status: DC
  Administered 2014-02-20 – 2014-02-21 (×2): 80 mg via ORAL
  Filled 2014-02-20 (×2): qty 2

## 2014-02-20 MED ORDER — NALOXONE HCL 1 MG/ML IJ SOLN
0.2500 mg/h | INTRAVENOUS | Status: DC
  Filled 2014-02-20: qty 4

## 2014-02-20 MED ORDER — NALOXONE HCL 0.4 MG/ML IJ SOLN
0.4000 mg | Freq: Once | INTRAMUSCULAR | Status: AC
  Administered 2014-02-20: 0.4 mg via INTRAVENOUS
  Filled 2014-02-20: qty 1

## 2014-02-20 MED ORDER — ONDANSETRON HCL 4 MG/2ML IJ SOLN: 4.0000 mg | Freq: Four times a day (QID) | INTRAMUSCULAR | Status: DC | PRN

## 2014-02-20 MED ORDER — ONDANSETRON HCL 4 MG PO TABS: 4.0000 mg | ORAL_TABLET | Freq: Four times a day (QID) | ORAL | Status: DC | PRN

## 2014-02-20 MED ORDER — SODIUM CHLORIDE 0.9 % IJ SOLN
3.0000 mL | Freq: Two times a day (BID) | INTRAMUSCULAR | Status: DC
  Administered 2014-02-21 (×2): 3 mL via INTRAVENOUS

## 2014-02-20 MED ORDER — TRAZODONE HCL 100 MG PO TABS
100.0000 mg | ORAL_TABLET | Freq: Every evening | ORAL | Status: DC | PRN
  Filled 2014-02-20: qty 1

## 2014-02-20 NOTE — H&P (Signed)
Hospitalist Admission History and Physical  Patient name: Nathan Frank Medical record number: [id number] Date of birth: 1941-06-12 Age: 73 y.o. Gender: male  Primary Care Provider: Pcp Not In System  Chief Complaint: heroin overdose  History of Present Illness:This is a 73 y.o. year old male with prior hx/o ETOH, polysubstance abuse, alcoholic cirrhosis, hepatitis C presenting with heroin overdose. Pt reports that he uses heroin about once a month. States that he usually snorts a small bag of heroin. Patient to start it today to inject the back of heroin subcutaneously in his abdomen. Patient is from his family unresponsive at home. Was brought to the ER for further evaluation. Was minimally responsive on arrival. Was given IV Narcan x1 week resolution of somnolence. Patient then became progressively more somnolent over the course of one to 2 hours. Start on a Narcan drip. Workup including ETOH, acetaminophen levels within normal limits. CBC and CMET essentially WNL. UDS pending. BPs [id number] systolic. No hypoxia. Tmax 99.2. Asked for admission for persistent somnolence and need for narcan gtt.  Pt repors that he drinks 1/5th of wine daily.    Patient Active Problem List   Diagnosis Date Noted  . Heroin overdose 02/20/2014  . S/P alcohol detoxification 02/02/2014  . Alcohol dependence 02/02/2014  . Depressive disorder 02/01/2014  . Symptomatic cholelithiasis 12/15/2013  . Abdominal pain 12/14/2013  . Gastritis 06/07/2012  . DVT of lower limb, acute 06/06/2012  . Anemia 06/06/2012  . Oral thrush 06/05/2012  . Alcohol abuse 06/04/2012  . Weight loss 06/04/2012  . Hepatitis C 06/04/2012  . 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 Hematuria 06/04/2012   Past Medical History: Past Medical History  Diagnosis Date  . Neuropathy   . Hypertension   . Hepatitis C   . 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 Alcohol abuse     Past Surgical History: Past Surgical History  Procedure Laterality Date  .  Esophagogastroduodenoscopy  06/07/2012    Procedure: ESOPHAGOGASTRODUODENOSCOPY (EGD);  Surgeon: Milus Banister, MD;  Location: [place];  Service: Endoscopy;  Laterality: N/A;  may need treatment of varices    Social History: History   Social History  . Marital Status: Single    Spouse Name: N/A    Number of Children: N/A  . Years of Education: N/A   Social History Main Topics  . Smoking status: Former Research scientist (life sciences)  . Smokeless tobacco: None  . Alcohol Use: Yes     Comment:  two fifths of wine per day  . Drug Use: Yes    Special: Marijuana     Comment: heroin  . Sexual Activity: Yes    Birth Control/ Protection: Condom   Other Topics Concern  . None   Social History Narrative  . None    Family History: Family History  Problem Relation Age of Onset  . Stroke Father   . Prostate cancer Brother     Allergies: No Known Allergies  Current Facility-Administered Medications  Medication Dose Route Frequency Provider Last Rate Last Dose  . chlordiazePOXIDE (LIBRIUM) capsule 10 mg  10 mg Oral Daily Shanda Howells, MD      . naloxone [facility]) 4 mg in dextrose 5 % 250 mL infusion  0.25 mg/hr Intravenous Continuous Leo Grosser, MD      . naloxone [facility]) 4 mg in dextrose 5 % 250 mL infusion  0.25 mg/hr Intravenous Continuous Shanda Howells, MD      . ondansetron [facility]) tablet 4 mg  4 mg Oral Q6H PRN Shanda Howells, MD  Or  . ondansetron (ZOFRAN) injection 4 mg  4 mg Intravenous Q6H PRN Shanda Howells, MD      . pantoprazole (PROTONIX) EC tablet 80 mg  80 mg Oral Daily Shanda Howells, MD      . sodium chloride 0.9 % 1,000 mL with thiamine 242 mg, folic acid 1 mg, multivitamins adult 10 mL infusion   Intravenous Once Shanda Howells, MD      . sodium chloride 0.9 % injection 3 mL  3 mL Intravenous Q12H Shanda Howells, MD      . traZODone (DESYREL) tablet 100 mg  100 mg Oral QHS PRN Shanda Howells, MD       Current Outpatient Prescriptions  Medication Sig Dispense Refill  .  chlordiazePOXIDE (LIBRIUM) 10 MG capsule Take 10 mg by mouth daily.      Marland Kitchen omeprazole (PRILOSEC) 20 MG capsule Take 1 capsule (20 mg total) by mouth daily. For acid reflux  60 capsule  0  . PRESCRIPTION MEDICATION Take 1 tablet by mouth. Blood pressure medication      . traMADol (ULTRAM) 50 MG tablet Take 1 tablet (50 mg total) by mouth every 8 (eight) hours as needed for moderate pain.  30 tablet    . traZODone (DESYREL) 100 MG tablet Take 1 tablet (100 mg total) by mouth at bedtime as needed for sleep.  30 tablet  0   Review Of Systems: 12 point ROS negative except as noted above in HPI.  Physical Exam: Filed Vitals:   02/20/14 2031  BP: 97/69  Pulse: 89  Temp:   Resp:     General: alert and cooperative HEENT: PERRLA, extra ocular movement intact and poor dentition  Heart: S1, S2 normal, no murmur, rub or gallop, regular rate and rhythm Lungs: clear to auscultation and no wheezes or rales Abdomen: abdomen is soft without significant tenderness, masses, organomegaly or guarding Extremities: extremities normal, atraumatic, no cyanosis or edema Skin:no rashes, no jaundice Neurology: normal without focal findings and no asterixis   Labs and Imaging: Lab Results  Component Value Date/Time   NA 142 02/20/2014  6:00 PM   K 3.8 02/20/2014  6:00 PM   CL 105 02/20/2014  6:00 PM   CO2 24 02/20/2014  6:00 PM   BUN 10 02/20/2014  6:00 PM   CREATININE 0.99 02/20/2014  6:00 PM   GLUCOSE 109* 02/20/2014  6:00 PM   Lab Results  Component Value Date   WBC 3.6* 02/20/2014   HGB 13.8 02/20/2014   HCT 40.2 02/20/2014   MCV 100.0 02/20/2014   PLT 146* 02/20/2014    Dg Chest Port 1 View  02/20/2014   CLINICAL DATA:  Altered mental status, reported heroin use, history hypertension, cirrhosis, hepatitis-C  EXAM: PORTABLE CHEST - 1 VIEW  COMPARISON:  Portable exam 1825 hr compared to 12/12/2013  FINDINGS: Minimal enlargement of cardiac silhouette.  Mediastinal contours and pulmonary vascularity normal.   Lungs clear.  No pleural effusion or pneumothorax.  No acute osseous findings.  IMPRESSION: Minimal enlargement of cardiac silhouette.  No acute abnormalities.   Electronically Signed   By: Lavonia Dana M.D.   On: 02/20/2014 18:57     Assessment and Plan: Auren Valdes is a 73 y.o. year old male presenting with heroin overdose   Heroin Overdose: Continue with narcan gtt. Stepdown bed. CIWA protocol in setting of polysubstance abuse. Check ammonia level. Continue outpt librium. If pt spikes fever, will need sepsis work up including TEE and broad spectrum abx in  setting of IH/subQ/? IV heroin use. Continue to follow closely.   Hep C/Cirrhosis: no jaundice, reports of hematemesis. Has had some intermittent mild diarrhea. Check C diff and hemoccult. Continue outpt regimen.   FEN/GI: heart healthy diet. High dose PPI. Banana bag. Multivitamin. Social work consult.   Prophylaxis: SCDs. Check INR.  Disposition: pending further evaluation  Code Status:Full Code.        Shanda Howells MD  Pager: [phone]

## 2014-02-20 NOTE — ED Notes (Signed)
Pt to ED via [facility] who st's pt has been using heroin.  St's pt's nephew called EMS due to pt yelling and not acting appropriately.  St's this is how he acts after using heroin.  On arrival pt. Hollering and kicking his legs then will become quiet.

## 2014-02-20 NOTE — ED Notes (Signed)
Pt remains alert and oriented x's 3, watching television

## 2014-02-20 NOTE — ED Provider Notes (Signed)
CSN: [id number]     Arrival date & time 02/20/14  1733 History   First MD Initiated Contact with Patient 02/20/14 1752     Chief Complaint  Patient presents with  . Drug Overdose     (Consider location/radiation/quality/duration/timing/severity/associated sxs/prior Treatment) Patient is a 73 y.o. male presenting with altered mental status. The history is provided by the patient.  Altered Mental Status Presenting symptoms: behavior changes, lethargy and partial responsiveness   Severity:  Severe Most recent episode:  Today Episode history:  Single Duration:  30 minutes Timing:  Constant Progression:  Unchanged Chronicity:  New Context: alcohol use and drug use   Associated symptoms: slurred speech and weakness   Associated symptoms: no difficulty breathing, no fever and no vomiting     Past Medical History  Diagnosis Date  . Neuropathy   . Hypertension   . Hepatitis C   . 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 Alcohol abuse    Past Surgical History  Procedure Laterality Date  . Esophagogastroduodenoscopy  06/07/2012    Procedure: ESOPHAGOGASTRODUODENOSCOPY (EGD);  Surgeon: Milus Banister, MD;  Location: [facility];  Service: Endoscopy;  Laterality: N/A;  may need treatment of varices   Family History  Problem Relation Age of Onset  . Stroke Father   . Prostate cancer Brother    History  Substance Use Topics  . Smoking status: Former Research scientist (life sciences)  . Smokeless tobacco: Not on file  . Alcohol Use: Yes     Comment:  two fifths of wine per day    Review of Systems  Constitutional: Negative for fever.  HENT: Positive for drooling.   Eyes: Negative for visual disturbance.  Gastrointestinal: Negative for vomiting.  Neurological: Positive for weakness.  Psychiatric/Behavioral: Negative for self-injury (not purposefully intending to harm himself).  All other systems reviewed and are negative.     Allergies  Review of patient's allergies indicates no known allergies.  Home  Medications   Prior to Admission medications   Medication Sig Start Date End Date Taking? Authorizing Provider  chlordiazePOXIDE (LIBRIUM) 10 MG capsule Take 10 mg by mouth daily.   Yes Historical Provider, MD  omeprazole (PRILOSEC) 20 MG capsule Take 1 capsule (20 mg total) by mouth daily. For acid reflux 02/05/14  Yes Encarnacion Slates, NP  PRESCRIPTION MEDICATION Take 1 tablet by mouth. Blood pressure medication   Yes Historical Provider, MD  traMADol (ULTRAM) 50 MG tablet Take 1 tablet (50 mg total) by mouth every 8 (eight) hours as needed for moderate pain. 02/05/14  Yes Encarnacion Slates, NP  traZODone (DESYREL) 100 MG tablet Take 1 tablet (100 mg total) by mouth at bedtime as needed for sleep. 02/05/14  Yes Encarnacion Slates, NP   BP 123/78  Pulse 104  Temp(Src) 99.2 F (37.3 C) (Oral)  Resp 18  SpO2 93% Physical Exam  Constitutional: He appears well-developed and well-nourished. He appears lethargic. No distress.  HENT:  Head: Normocephalic and atraumatic.  Eyes: Conjunctivae are normal.  Pinpoint pupils bilaterally  Neck: Neck supple. No tracheal deviation present.  Cardiovascular: Regular rhythm.   No murmur heard. Pulmonary/Chest: Effort normal. No respiratory distress. He has no wheezes. He has no rales.  Upper airway congestion sounds relieved by suctioning  Abdominal: Soft. He exhibits no distension. There is no tenderness.  Neurological: He appears lethargic. He is disoriented. No cranial nerve deficit. GCS eye subscore is 4. GCS verbal subscore is 2. GCS motor subscore is 4.  Skin: Skin is warm and dry.  Psychiatric: His affect is labile. His speech is delayed and slurred. He is slowed.    ED Course  Procedures (including critical care time) Labs Review Labs Reviewed  CBC WITH DIFFERENTIAL - Abnormal; Notable for the following:    WBC 3.6 (*)    RBC 4.02 (*)    MCH 34.3 (*)    RDW 16.1 (*)    Platelets 146 (*)    Monocytes Relative 15 (*)    All other components within  normal limits  COMPREHENSIVE METABOLIC PANEL - Abnormal; Notable for the following:    Glucose, Bld 109 (*)    Albumin 3.3 (*)    AST 55 (*)    GFR calc non Af Amer 79 (*)    All other components within normal limits  URINALYSIS, ROUTINE W REFLEX MICROSCOPIC - Abnormal; Notable for the following:    Color, Urine AMBER (*)    APPearance CLOUDY (*)    Hgb urine dipstick MODERATE (*)    Bilirubin Urine SMALL (*)    Protein, ur 30 (*)    All other components within normal limits  URINE RAPID DRUG SCREEN (HOSP PERFORMED) - Abnormal; Notable for the following:    Opiates POSITIVE (*)    Benzodiazepines POSITIVE (*)    All other components within normal limits  URINE MICROSCOPIC-ADD ON - Abnormal; Notable for the following:    Casts HYALINE CASTS (*)    Crystals CA OXALATE CRYSTALS (*)    All other components within normal limits  CBG MONITORING, ED - Abnormal; Notable for the following:    Glucose-Capillary 103 (*)    All other components within normal limits  ACETAMINOPHEN LEVEL  ETHANOL  COMPREHENSIVE METABOLIC PANEL  CBC WITH DIFFERENTIAL    Imaging Review Dg Chest Port 1 View  02/20/2014   CLINICAL DATA:  Altered mental status, reported heroin use, history hypertension, cirrhosis, hepatitis-C  EXAM: PORTABLE CHEST - 1 VIEW  COMPARISON:  Portable exam 1825 hr compared to 12/12/2013  FINDINGS: Minimal enlargement of cardiac silhouette.  Mediastinal contours and pulmonary vascularity normal.  Lungs clear.  No pleural effusion or pneumothorax.  No acute osseous findings.  IMPRESSION: Minimal enlargement of cardiac silhouette.  No acute abnormalities.   Electronically Signed   By: Lavonia Dana M.D.   On: 02/20/2014 18:57     EKG Interpretation None      MDM   Final diagnoses:  Heroin overdose   73 y.o. male presents with acute intoxication on her when as reported by his family who was on the scene with EMS. He is wailing and gargling secretions and is slowed, unable to respond  to staff on arrival with pinpoint pupils. Labs were drawn and the patient was suctioned with improved breathing but was still minimally responsive so was given 0.4 mg of Narcan and had resolution of his symptoms. He was able to speak at that point and admitted to skin popping a bag of heroin today into the right portion of his abdomen. He normally snorts the baggy of heroin when he gets it but decided on a different route today. He normally drinks a pint of wine daily as well and he admits to doing that today. Initially head CT was ordered but since the patient's symptoms resolved with narcotic reversal we will defer that imaging. There is no evidence of aspiration on the chest x-ray and his metabolic profile appears to be at baseline. No evidence of co-ingestion. We discussed the dangers of drug abuse and alcohol abuse  to his immediate well-being and long-term health.   3 hours after arrival the patient had recurrent symptoms of euphoria, altered mental status, and depressed breathing so was given a second dose of Narcan and placed on an infusion. He will need admission to high level of care in the hospital for monitoring but remained hemodynamically stable throughout his emergency department course.   Leo Grosser, MD 02/21/14 [phone]

## 2014-02-20 NOTE — ED Notes (Signed)
Pt eating [place] sandwich and drinking a drink at this time.

## 2014-02-20 NOTE — ED Notes (Signed)
Pt admits to 1 pint of wine and 1 bag of heroin.  Pt alert and oriented after Narcan 0.4mg  IV.

## 2014-02-20 NOTE — ED Notes (Signed)
Paged admitting regarding bed request, patient needs to be upgraded to ICU due to Narcan drip

## 2014-02-21 DIAGNOSIS — F1020 Alcohol dependence, uncomplicated: Secondary | ICD-10-CM

## 2014-02-21 DIAGNOSIS — F1010 Alcohol abuse, uncomplicated: Secondary | ICD-10-CM

## 2014-02-21 DIAGNOSIS — T401X4A Poisoning by heroin, undetermined, initial encounter: Principal | ICD-10-CM

## 2014-02-21 LAB — CBC WITH DIFFERENTIAL/PLATELET
BASOS ABS: 0 10*3/uL (ref 0.0–0.1)
Basophils Relative: 0 % (ref 0–1)
EOS PCT: 1 % (ref 0–5)
Eosinophils Absolute: 0 10*3/uL (ref 0.0–0.7)
HCT: 34.5 % — ABNORMAL LOW (ref 39.0–52.0)
Hemoglobin: 11.8 g/dL — ABNORMAL LOW (ref 13.0–17.0)
Lymphocytes Relative: 22 % (ref 12–46)
Lymphs Abs: 1 10*3/uL (ref 0.7–4.0)
MCH: 34 pg (ref 26.0–34.0)
MCHC: 34.2 g/dL (ref 30.0–36.0)
MCV: 99.4 fL (ref 78.0–100.0)
MONO ABS: 0.5 10*3/uL (ref 0.1–1.0)
Monocytes Relative: 13 % — ABNORMAL HIGH (ref 3–12)
Neutro Abs: 2.7 10*3/uL (ref 1.7–7.7)
Neutrophils Relative %: 64 % (ref 43–77)
Platelets: 125 10*3/uL — ABNORMAL LOW (ref 150–400)
RBC: 3.47 MIL/uL — ABNORMAL LOW (ref 4.22–5.81)
RDW: 16.3 % — AB (ref 11.5–15.5)
WBC: 4.3 10*3/uL (ref 4.0–10.5)

## 2014-02-21 LAB — COMPREHENSIVE METABOLIC PANEL
ALK PHOS: 54 U/L (ref 39–117)
ALT: 23 U/L (ref 0–53)
AST: 106 U/L — ABNORMAL HIGH (ref 0–37)
Albumin: 2.7 g/dL — ABNORMAL LOW (ref 3.5–5.2)
BUN: 11 mg/dL (ref 6–23)
CO2: 23 mEq/L (ref 19–32)
Calcium: 7.8 mg/dL — ABNORMAL LOW (ref 8.4–10.5)
Chloride: 107 mEq/L (ref 96–112)
Creatinine, Ser: 0.92 mg/dL (ref 0.50–1.35)
GFR calc Af Amer: >90 mL/min (ref >90)
GFR calc non Af Amer: 82 mL/min — ABNORMAL LOW (ref >90)
Glucose, Bld: 101 mg/dL — ABNORMAL HIGH (ref 70–99)
Potassium: 3.4 mEq/L — ABNORMAL LOW (ref 3.7–5.3)
SODIUM: 141 meq/L (ref 137–147)
Total Bilirubin: 0.6 mg/dL (ref 0.3–1.2)
Total Protein: 6.3 g/dL (ref 6.0–8.3)

## 2014-02-21 LAB — MAGNESIUM: Magnesium: 1.3 mg/dL — ABNORMAL LOW (ref 1.5–2.5)

## 2014-02-21 LAB — MRSA PCR SCREENING: MRSA BY PCR: NEGATIVE

## 2014-02-21 MED ORDER — CLONIDINE HCL 0.1 MG PO TABS
0.1000 mg | ORAL_TABLET | Freq: Three times a day (TID) | ORAL | Status: DC
  Administered 2014-02-21 – 2014-02-22 (×3): 0.1 mg via ORAL
  Filled 2014-02-21 (×8): qty 1

## 2014-02-21 MED ORDER — SENNOSIDES-DOCUSATE SODIUM 8.6-50 MG PO TABS
1.0000 | ORAL_TABLET | Freq: Two times a day (BID) | ORAL | Status: DC | PRN
  Administered 2014-02-21: 1 via ORAL
  Filled 2014-02-21: qty 2

## 2014-02-21 MED ORDER — PNEUMOCOCCAL VAC POLYVALENT 25 MCG/0.5ML IJ INJ: 0.5000 mL | INJECTION | INTRAMUSCULAR | Status: DC | PRN

## 2014-02-21 MED ORDER — VITAMIN B-1 100 MG PO TABS
100.0000 mg | ORAL_TABLET | Freq: Every day | ORAL | Status: DC
  Administered 2014-02-21: 100 mg via ORAL
  Filled 2014-02-21 (×3): qty 1

## 2014-02-21 MED ORDER — POTASSIUM CHLORIDE CRYS ER 20 MEQ PO TBCR
40.0000 meq | EXTENDED_RELEASE_TABLET | Freq: Once | ORAL | Status: AC
  Administered 2014-02-21: 40 meq via ORAL
  Filled 2014-02-21: qty 2

## 2014-02-21 MED ORDER — MAGNESIUM SULFATE 40 MG/ML IJ SOLN
2.0000 g | Freq: Once | INTRAMUSCULAR | Status: AC
  Administered 2014-02-21: 2 g via INTRAVENOUS
  Filled 2014-02-21: qty 50

## 2014-02-21 MED ORDER — FOLIC ACID 1 MG PO TABS
1.0000 mg | ORAL_TABLET | Freq: Every day | ORAL | Status: DC
  Administered 2014-02-21: 1 mg via ORAL
  Filled 2014-02-21 (×3): qty 1

## 2014-02-21 MED ORDER — NALOXONE HCL 0.4 MG/ML IJ SOLN: 0.4000 mg | INTRAMUSCULAR | Status: DC | PRN

## 2014-02-21 NOTE — ED Notes (Signed)
Pt resting quietly at this time watching televison

## 2014-02-21 NOTE — ED Provider Notes (Signed)
I saw and evaluated the patient, reviewed the resident's note and I agree with the findings and plan.   EKG Interpretation None      Nathan Frank is a 73 y.o. male hx of drug use here with heroin intoxication. Drank wine and injected heroin today. He was noted to be altered with gargling secretions so EMS was called and he was given narcan en route. He is awake on my exam and very verbose. Observed for 3 hrs, then started becoming tired with depressed breathing. Given narcan and started narcan drip for heroin overdose. Will admit to stepdown.   CRITICAL CARE Performed by: Wandra Arthurs   Total critical care time: 30 min   Critical care time was exclusive of separately billable procedures and treating other patients.  Critical care was necessary to treat or prevent imminent or life-threatening deterioration.  Critical care was time spent personally by me on the following activities: development of treatment plan with patient and/or surrogate as well as nursing, discussions with consultants, evaluation of patient's response to treatment, examination of patient, obtaining history from patient or surrogate, ordering and performing treatments and interventions, ordering and review of laboratory studies, ordering and review of radiographic studies, pulse oximetry and re-evaluation of patient's condition.   Level V caveat- AMS    Wandra Arthurs, MD 02/21/14 [phone]

## 2014-02-21 NOTE — Progress Notes (Addendum)
Pt with HR in 140's pt states he has a history of afib with RVR. Pt states he used to be on "heart medication", MD notified.

## 2014-02-21 NOTE — Progress Notes (Signed)
Pt transferred to 2W35. Report called to RN and bedside report given to receiving RN. Pt stable, AAOX3, Vitals WNL, GCS 15. Pt belongings at bedside, family updated. Pt safety maintained.

## 2014-02-21 NOTE — ED Notes (Addendum)
Consulting provider [place] NP) at bedside

## 2014-02-21 NOTE — Progress Notes (Signed)
Utilization Review Completed.  

## 2014-02-21 NOTE — ED Notes (Signed)
Pt eating [place] sandwich and watching television.  Pt remains alert and oriented x's 3.

## 2014-02-21 NOTE — Consult Note (Signed)
Name: Nathan Frank MRN: [id number] DOB: 03/07/1941    ADMISSION DATE:  02/20/2014 CONSULTATION DATE:  02/20/2014  REFERRING MD : [facility] PRIMARY SERVICE:  [facility]  CHIEF COMPLAINT:  opiate overdose  BRIEF PATIENT DESCRIPTION: 73 year old male with history of hep C, alcoholic cirrhosis, and HTN presented to ED via EMS 5/19 after herion overdose. Started on narcan gtt in ED. PCCM asked to see.   SIGNIFICANT EVENTS / STUDIES:  5/19 admitted  LINES / TUBES: PIV  CULTURES: none  ANTIBIOTICS: none  HISTORY OF PRESENT ILLNESS:  73 year old male with history of hep C, alcoholic cirrhosis, and HTN presented to ED via EMS 5/19 after herion overdose. Was given narcan and he responded well. Level of consciousness began to deteriorate again, so he was started on narcan drip. Currently he voices no complaints. States that he uses heroin once every couple months. Usually snorts it, but today injected into abdomen, "I should've only taken half as much as I did". Denies suicidal ideation. Denies use of other drugs. One glass of wine 5/19. PCCM asked to see.    PAST MEDICAL HISTORY :  Past Medical History  Diagnosis Date  . Neuropathy   . Hypertension   . Hepatitis C   . 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 Alcohol abuse    Past Surgical History  Procedure Laterality Date  . Esophagogastroduodenoscopy  06/07/2012    Procedure: ESOPHAGOGASTRODUODENOSCOPY (EGD);  Surgeon: Milus Banister, MD;  Location: [facility];  Service: Endoscopy;  Laterality: N/A;  may need treatment of varices   Prior to Admission medications   Medication Sig Start Date End Date Taking? Authorizing Provider  chlordiazePOXIDE (LIBRIUM) 10 MG capsule Take 10 mg by mouth daily.   Yes Historical Provider, MD  omeprazole (PRILOSEC) 20 MG capsule Take 1 capsule (20 mg total) by mouth daily. For acid reflux 02/05/14  Yes Encarnacion Slates, NP  PRESCRIPTION MEDICATION Take 1 tablet by mouth. Blood pressure medication   Yes Historical Provider, MD    traMADol (ULTRAM) 50 MG tablet Take 1 tablet (50 mg total) by mouth every 8 (eight) hours as needed for moderate pain. 02/05/14  Yes Encarnacion Slates, NP  traZODone (DESYREL) 100 MG tablet Take 1 tablet (100 mg total) by mouth at bedtime as needed for sleep. 02/05/14  Yes Encarnacion Slates, NP   No Known Allergies  FAMILY HISTORY:  Family History  Problem Relation Age of Onset  . Stroke Father   . Prostate cancer Brother    SOCIAL HISTORY:  reports that he has quit smoking. He does not have any smokeless tobacco history on file. He reports that he drinks alcohol. He reports that he uses illicit drugs (Marijuana).  REVIEW OF SYSTEMS:   Bolds are positive  Constitutional: weight loss, gain, night sweats, fevers, chills, fatigue .  HEENT: headaches, Sore throat, sneezing, nasal congestion, post nasal drip, Difficulty swallowing, Tooth/dental problems, visual complaints visual changes, ear ache CV:  chest pain, radiates: ,Orthopnea, PND, swelling in lower extremities, dizziness, palpitations, syncope.  GI  heartburn, indigestion, abdominal pain, nausea, vomiting, diarrhea, change in bowel habits, loss of appetite, bloody stools.  Resp: cough, productive: , hemoptysis, dyspnea, chest pain, pleuritic.  Skin: rash or itching or icterus GU: dysuria, change in color of urine, urgency or frequency. flank pain, hematuria  MS: joint pain or swelling. decreased range of motion  Psych: change in mood or affect. depression or anxiety.  Neuro: difficulty with speech, weakness, numbness, ataxia  SUBJECTIVE:   VITAL SIGNS: Temp:  [99.2 F (37.3 C)] 99.2 F (37.3 C) (05/19 1744) Pulse Rate:  [72-154] 77 (05/20 0210) Resp:  [13-29] 19 (05/20 0210) BP: (93-147)/(42-110) 134/75 mmHg (05/20 0210) SpO2:  [93 %-100 %] 100 % (05/20 0210)  PHYSICAL EXAMINATION: General: Male of normal body habitus in no acute distress. Neuro:  Awake, alert, oriented. No deficits HEENT:  [place]/AT, PERRL Neck:  Supple, No  JVD Cardiovascular:  RRR, no peripheral edema Lungs:  Clear anterior, Resp's even unlabored.  Abdomen:  Soft, non-tender Musculoskeletal:  No acute deformity or ROM limitation Skin:  Intact   Recent Labs Lab 02/20/14 1800 02/21/14 0158  NA 142 141  K 3.8 3.4*  CL 105 107  CO2 24 23  BUN 10 11  CREATININE 0.99 0.92  GLUCOSE 109* 101*    Recent Labs Lab 02/20/14 1800 02/21/14 0158  HGB 13.8 11.8*  HCT 40.2 34.5*  WBC 3.6* 4.3  PLT 146* 125*   Dg Chest Port 1 View  02/20/2014   CLINICAL DATA:  Altered mental status, reported heroin use, history hypertension, cirrhosis, hepatitis-C  EXAM: PORTABLE CHEST - 1 VIEW  COMPARISON:  Portable exam 1825 hr compared to 12/12/2013  FINDINGS: Minimal enlargement of cardiac silhouette.  Mediastinal contours and pulmonary vascularity normal.  Lungs clear.  No pleural effusion or pneumothorax.  No acute osseous findings.  IMPRESSION: Minimal enlargement of cardiac silhouette.  No acute abnormalities.   Electronically Signed   By: Lavonia Dana M.D.   On: 02/20/2014 18:57    ASSESSMENT / PLAN:   Heroin Overdose testing for other concontaminants has been negative Alcoholic Cirrhosis Hepatitis C  Rec's Supplemental O2 as needed to keep SpO2 greater than 92% Continue Narcan gtt overnight, can likely d/c in am.  Hold sedating meds tramadol and trazadone  Primary team to admit to ICU for management of Narcan gtt.  Management of hepatitis and cirrhosis per primary team.   Georgann Housekeeper, ACNP [facility]/Critical Care Pager [phone] or [phone]  Patient used an entire bag of heroin for the first time, requires a narcan drip for overdose.  Appears much more alert and interactive now.  Will d/c narcan, continue banana bag, transfer to med-surg, PCCM will sign off, please call back if neeeded.  Patient seen and examined, agree with above note.  I dictated the care and orders written for this patient under my direction.  Rush Farmer, MD [phone]

## 2014-02-21 NOTE — Progress Notes (Signed)
[facility] TEAM 1 - Stepdown/ICU TEAM Progress Note  Nathan Frank KGU:[id number] DOB: [birth year] DOA: 02/20/2014 PCP: Pcp Not In System  Admit HPI / Brief Narrative: 73 y.o. year old male with prior hx/o ETOH, polysubstance abuse, alcoholic cirrhosis, and hepatitis C who presented with a heroin overdose. Pt reported that he uses heroin about once a month. States that he usually snorts a "small bag" of heroin. This time however the patient decided instead to inject the bag of heroin subcutaneously in his abdomen. Patient was discovered by his family unresponsive at home, and was brought to the ER for further evaluation. Was minimally responsive on arrival. Was given IV Narcan with improvement therefore was subsequently placed on a Narcan drip. Workup included ETOH and acetaminophen levels within normal limits. CBC and CMET essentially WNL. No hypoxia. Tmax 99.2.  HPI/Subjective: Pt is alert and conversant.  He has not complaints at this time.  He expresses remorse for his drug and EtOH abuse and states he is motivated to stop both.  He denies cp, sob, f/c, or abdom pain.    Assessment/Plan:  Narcotic abuse with OD Appears stable at this time - monitor over night on tele - no evidence of severe withdrawal at this time  EtOH abuse  Counseled on the absolute need to stop drinking - CSW to assist with community resource identification - no evidence of signif withdrawal sx at this time   Tachycardia Pt having bouts of tachycardia c/w afib at times, and some v tach - is clinically stable and asymptomatic - likely related to narcotic withdrawal - watch on tele - add clonidine - could use BB if continues as UDS negative for cocaine    Cirrhosis  LFTs elevated in pattern c/w EtOH use - albumin low but now severely so yet - check coags in AM - no evidence clinicaly of encephalopathy  Hepatitis C Would not be a candidate for tx w/ ongoing drug/EtOH abuse   Code Status: FULL Family Communication: no  family present at time of exam Disposition Plan: transfer to tele bed - possible d/c in AM if remains stable over night   Consultants: PCCM  Procedures: none  Antibiotics: none  DVT prophylaxis: SCDs  Objective: Blood pressure 122/79, pulse 81, temperature 98.2 F (36.8 C), temperature source Oral, resp. rate 19, height 6' (1.829 m), weight 82.6 kg (182 lb 1.6 oz), SpO2 97.00%.  Intake/Output Summary (Last 24 hours) at 02/21/14 1128 Last data filed at 02/21/14 1000  Gross per 24 hour  Intake   96.8 ml  Output    100 ml  Net   -3.2 ml   Exam: General: No acute respiratory distress - alert and oriented and quite pleasant  Lungs: Clear to auscultation bilaterally without wheezes or crackles Cardiovascular: Regular rate and rhythm without murmur gallop or rub normal S1 and S2 Abdomen: Nontender, nondistended, soft, bowel sounds positive, no rebound, no ascites, no appreciable mass Extremities: No significant cyanosis, clubbing, or edema bilateral lower extremities  Data Reviewed: Basic Metabolic Panel:  Recent Labs Lab 02/20/14 1800 02/21/14 0158  NA 142 141  K 3.8 3.4*  CL 105 107  CO2 24 23  GLUCOSE 109* 101*  BUN 10 11  CREATININE 0.99 0.92  CALCIUM 8.8 7.8*   Liver Function Tests:  Recent Labs Lab 02/20/14 1800 02/21/14 0158  AST 55* 106*  ALT 17 23  ALKPHOS 68 54  BILITOT 0.6 0.6  PROT 7.5 6.3  ALBUMIN 3.3* 2.7*   CBC:  Recent  Labs Lab 02/20/14 1800 02/21/14 0158  WBC 3.6* 4.3  NEUTROABS 1.7 2.7  HGB 13.8 11.8*  HCT 40.2 34.5*  MCV 100.0 99.4  PLT 146* 125*   CBG:  Recent Labs Lab 02/20/14 2032  GLUCAP 103*    Recent Results (from the past 240 hour(s))  MRSA PCR SCREENING     Status: None   Collection Time    02/21/14  4:54 AM      Result Value Ref Range Status   MRSA by PCR NEGATIVE  NEGATIVE Final   Comment:            The GeneXpert MRSA Assay (FDA     approved for NASAL specimens     only), is one component of a      comprehensive MRSA colonization     surveillance program. It is not     intended to diagnose MRSA     infection nor to guide or     monitor treatment for     MRSA infections.    Studies:  Recent x-ray studies have been reviewed in detail by the Attending Physician  Scheduled Meds:  Scheduled Meds: . chlordiazePOXIDE  10 mg Oral Daily  . pantoprazole  80 mg Oral Daily  . sodium chloride  3 mL Intravenous Q12H    Time spent on care of this patient: 35 mins  Cherene Altes, MD [facility] For Consults/Admissions - Flow Manager - [phone] Office  [phone] Pager [phone]  On-Call/Text Page:      [email or web address]      password [facility]  02/21/2014, 11:28 AM   LOS: 1 day

## 2014-02-22 LAB — COMPREHENSIVE METABOLIC PANEL
ALBUMIN: 2.5 g/dL — AB (ref 3.5–5.2)
ALT: 22 U/L (ref 0–53)
AST: 95 U/L — ABNORMAL HIGH (ref 0–37)
Alkaline Phosphatase: 79 U/L (ref 39–117)
BUN: 14 mg/dL (ref 6–23)
CALCIUM: 8.1 mg/dL — AB (ref 8.4–10.5)
CO2: 20 mEq/L (ref 19–32)
CREATININE: 0.8 mg/dL (ref 0.50–1.35)
Chloride: 109 mEq/L (ref 96–112)
GFR calc non Af Amer: 87 mL/min — ABNORMAL LOW (ref >90)
GLUCOSE: 100 mg/dL — AB (ref 70–99)
POTASSIUM: 4.7 meq/L (ref 3.7–5.3)
Sodium: 141 mEq/L (ref 137–147)
Total Bilirubin: 0.4 mg/dL (ref 0.3–1.2)
Total Protein: 6.1 g/dL (ref 6.0–8.3)

## 2014-02-22 LAB — CBC
HEMATOCRIT: 32.9 % — AB (ref 39.0–52.0)
HEMOGLOBIN: 11.1 g/dL — AB (ref 13.0–17.0)
MCH: 33.8 pg (ref 26.0–34.0)
MCHC: 33.7 g/dL (ref 30.0–36.0)
MCV: 100.3 fL — AB (ref 78.0–100.0)
Platelets: 123 10*3/uL — ABNORMAL LOW (ref 150–400)
RBC: 3.28 MIL/uL — AB (ref 4.22–5.81)
RDW: 16.1 % — AB (ref 11.5–15.5)
WBC: 2.8 10*3/uL — ABNORMAL LOW (ref 4.0–10.5)

## 2014-02-22 LAB — PROTIME-INR
INR: 1.17 (ref 0.00–1.49)
Prothrombin Time: 14.7 seconds (ref 11.6–15.2)

## 2014-02-22 LAB — APTT: APTT: 24 s (ref 24–37)

## 2014-02-22 LAB — PHOSPHORUS: Phosphorus: 3.2 mg/dL (ref 2.3–4.6)

## 2014-02-22 NOTE — Progress Notes (Signed)
CSW met with patient to discuss current drug use.Patient states he is going to stop. Patient stated it was months ago since he's done heroin and only did it because him and his wife were having problems. Patient states he is going to follow up with outpatient treatment programs and states he needs a bus pass to go home. CSW provided substance use list and bus pass. CSW signing off at this time. Please re consult if social work needs arise.  Jeanette Caprice, MSW, [place]

## 2014-02-22 NOTE — Discharge Summary (Signed)
Physician Discharge Summary  Nathan Frank HCW:[id number] DOB: 1941-02-11 DOA: 02/20/2014  PCP: Pcp Not In System  Admit date: 02/20/2014 Discharge date: 02/22/2014  Time spent: greater than 30 minutes   Discharge Diagnoses:  Active Problems:   Heroin overdose alcohol abuse  Discharge Condition: stable  Filed Weights   02/21/14 0445  Weight: 82.6 kg (182 lb 1.6 oz)    History of present illness:   73 y.o. year old male with prior hx/o ETOH, polysubstance abuse, alcoholic cirrhosis, hepatitis C presenting with heroin overdose. Pt reports that he uses heroin about once a month. States that he usually snorts a small bag of heroin. Patient to start it today to inject the back of heroin subcutaneously in his abdomen. Patient is from his family unresponsive at home. Was brought to the ER for further evaluation. Was minimally responsive on arrival. Was given IV Narcan x1 week resolution of somnolence. Patient then became progressively more somnolent over the course of one to 2 hours. Start on a Narcan drip. Workup including ETOH, acetaminophen levels within normal limits. CBC and CMET essentially WNL. UDS pending. BPs [id number] systolic. No hypoxia. Tmax 99.2. Asked for admission for persistent somnolence and need for narcan gtt. Pt repors that he drinks 1/5th of wine daily.    Hospital Course:  Admitted to ICU on narcan drip. PCCM consulted. Narcan stopped. Patient wishes to quit using drugs and alcohol. Consulted social work. Stable for discharge  Procedures:  none  Consultations:  PCCM  Discharge Exam: Filed Vitals:   02/22/14 0619  BP: 142/87  Pulse: 64  Temp: 98.4 F (36.9 C)  Resp: 18    General: alert, oriented. Calm and cooperative Cardiovascular: RRR without MGR Respiratory: CTA without WRR  Discharge Instructions You were cared for by a hospitalist during your hospital stay. If you have any questions about your discharge medications or the care you received  while you were in the hospital after you are discharged, you can call the unit and asked to speak with the hospitalist on call if the hospitalist that took care of you is not available. Once you are discharged, your primary care physician will handle any further medical issues. Please note that NO REFILLS for any discharge medications will be authorized once you are discharged, as it is imperative that you return to your primary care physician (or establish a relationship with a primary care physician if you do not have one) for your aftercare needs so that they can reassess your need for medications and monitor your lab values.  Discharge Instructions   Activity as tolerated - No restrictions    Complete by:  As directed      Diet - low sodium heart healthy    Complete by:  As directed             Medication List    STOP taking these medications       traMADol 50 MG tablet  Commonly known as:  ULTRAM      TAKE these medications       chlordiazePOXIDE 10 MG capsule  Commonly known as:  LIBRIUM  Take 10 mg by mouth daily.     omeprazole 20 MG capsule  Commonly known as:  PRILOSEC  Take 1 capsule (20 mg total) by mouth daily. For acid reflux     PRESCRIPTION MEDICATION  Take 1 tablet by mouth. Blood pressure medication     traZODone 100 MG tablet  Commonly known as:  DESYREL  Take 1  tablet (100 mg total) by mouth at bedtime as needed for sleep.       No Known Allergies     Follow-up Information   Follow up with primary care provider of choice.       The results of significant diagnostics from this hospitalization (including imaging, microbiology, ancillary and laboratory) are listed below for reference.    Significant Diagnostic Studies: Ct Head Wo Contrast  02/13/2014   CLINICAL DATA:  pain head pain. Fall in 5 times in the last 6 months. Right-sided head pain.  EXAM: CT HEAD WITHOUT CONTRAST  CT CERVICAL SPINE WITHOUT CONTRAST  TECHNIQUE: Multidetector CT imaging of  the head and cervical spine was performed following the standard protocol without intravenous contrast. Multiplanar CT image reconstructions of the cervical spine were also generated.  COMPARISON:  CT HEAD W/O CM dated 08/26/2013  FINDINGS: CT HEAD FINDINGS  No mass lesion, mass effect, midline shift, hydrocephalus, hemorrhage. No acute territorial cortical ischemia/infarct. Atrophy and chronic ischemic white matter disease is present. Calvarium intact. Paranasal sinuses appear within normal limits.  CT CERVICAL SPINE FINDINGS  Mild cervical degenerative disc and facet disease is present. 1 mm anterolisthesis of C4 on C5 and C5 on C6 appears degenerative and probably is facet mediated. Prevertebral soft tissues are normal. Lung apices appear normal. Odontoid day and occipital condyles appear intact.  IMPRESSION: 1. Atrophy and chronic ischemic white matter disease without acute intracranial abnormality. 2. Mild cervical spine degenerative disease. No cervical spine fracture or dislocation.   Electronically Signed   By: Dereck Ligas M.D.   On: 02/13/2014 17:54   Ct Cervical Spine Wo Contrast  02/13/2014   CLINICAL DATA:  pain head pain. Fall in 5 times in the last 6 months. Right-sided head pain.  EXAM: CT HEAD WITHOUT CONTRAST  CT CERVICAL SPINE WITHOUT CONTRAST  TECHNIQUE: Multidetector CT imaging of the head and cervical spine was performed following the standard protocol without intravenous contrast. Multiplanar CT image reconstructions of the cervical spine were also generated.  COMPARISON:  CT HEAD W/O CM dated 08/26/2013  FINDINGS: CT HEAD FINDINGS  No mass lesion, mass effect, midline shift, hydrocephalus, hemorrhage. No acute territorial cortical ischemia/infarct. Atrophy and chronic ischemic white matter disease is present. Calvarium intact. Paranasal sinuses appear within normal limits.  CT CERVICAL SPINE FINDINGS  Mild cervical degenerative disc and facet disease is present. 1 mm anterolisthesis of  C4 on C5 and C5 on C6 appears degenerative and probably is facet mediated. Prevertebral soft tissues are normal. Lung apices appear normal. Odontoid day and occipital condyles appear intact.  IMPRESSION: 1. Atrophy and chronic ischemic white matter disease without acute intracranial abnormality. 2. Mild cervical spine degenerative disease. No cervical spine fracture or dislocation.   Electronically Signed   By: Dereck Ligas M.D.   On: 02/13/2014 17:54   Dg Chest Port 1 View  02/20/2014   CLINICAL DATA:  Altered mental status, reported heroin use, history hypertension, cirrhosis, hepatitis-C  EXAM: PORTABLE CHEST - 1 VIEW  COMPARISON:  Portable exam 1825 hr compared to 12/12/2013  FINDINGS: Minimal enlargement of cardiac silhouette.  Mediastinal contours and pulmonary vascularity normal.  Lungs clear.  No pleural effusion or pneumothorax.  No acute osseous findings.  IMPRESSION: Minimal enlargement of cardiac silhouette.  No acute abnormalities.   Electronically Signed   By: Lavonia Dana M.D.   On: 02/20/2014 18:57    Microbiology: Recent Results (from the past 240 hour(s))  MRSA PCR SCREENING  Status: None   Collection Time    02/21/14  4:54 AM      Result Value Ref Range Status   MRSA by PCR NEGATIVE  NEGATIVE Final   Comment:            The GeneXpert MRSA Assay (FDA     approved for NASAL specimens     only), is one component of a     comprehensive MRSA colonization     surveillance program. It is not     intended to diagnose MRSA     infection nor to guide or     monitor treatment for     MRSA infections.     Labs: Basic Metabolic Panel:  Recent Labs Lab 02/20/14 1800 02/21/14 0158 02/22/14 0310  NA 142 141 141  K 3.8 3.4* 4.7  CL 105 107 109  CO2 24 23 20   GLUCOSE 109* 101* 100*  BUN 10 11 14   CREATININE 0.99 0.92 0.80  CALCIUM 8.8 7.8* 8.1*  MG  --  1.3*  --   PHOS  --   --  3.2   Liver Function Tests:  Recent Labs Lab 02/20/14 1800 02/21/14 0158  02/22/14 0310  AST 55* 106* 95*  ALT 17 23 22   ALKPHOS 68 54 79  BILITOT 0.6 0.6 0.4  PROT 7.5 6.3 6.1  ALBUMIN 3.3* 2.7* 2.5*   No results found for this basename: LIPASE, AMYLASE,  in the last 168 hours No results found for this basename: AMMONIA,  in the last 168 hours CBC:  Recent Labs Lab 02/20/14 1800 02/21/14 0158 02/22/14 0310  WBC 3.6* 4.3 2.8*  NEUTROABS 1.7 2.7  --   HGB 13.8 11.8* 11.1*  HCT 40.2 34.5* 32.9*  MCV 100.0 99.4 100.3*  PLT 146* 125* 123*   Cardiac Enzymes: No results found for this basename: CKTOTAL, CKMB, CKMBINDEX, TROPONINI,  in the last 168 hours BNP: BNP (last 3 results) No results found for this basename: PROBNP,  in the last 8760 hours CBG:  Recent Labs Lab 02/20/14 2032  GLUCAP 103*       Signed:  Delfina Redwood  [facility] 02/22/2014, 10:17 AM

## 2014-12-05 DIAGNOSIS — E785 Hyperlipidemia, unspecified: Secondary | ICD-10-CM | POA: Diagnosis not present

## 2014-12-05 DIAGNOSIS — Z23 Encounter for immunization: Secondary | ICD-10-CM | POA: Diagnosis not present

## 2014-12-05 DIAGNOSIS — Z125 Encounter for screening for malignant neoplasm of prostate: Secondary | ICD-10-CM | POA: Diagnosis not present

## 2014-12-05 DIAGNOSIS — Z0000 Encounter for general adult medical examination without abnormal findings: Secondary | ICD-10-CM | POA: Diagnosis not present

## 2014-12-13 ENCOUNTER — Encounter (HOSPITAL_COMMUNITY): Payer: Self-pay | Admitting: Emergency Medicine

## 2014-12-13 ENCOUNTER — Other Ambulatory Visit (HOSPITAL_COMMUNITY): Payer: Self-pay

## 2014-12-13 ENCOUNTER — Emergency Department (HOSPITAL_COMMUNITY): Payer: Medicare Other

## 2014-12-13 ENCOUNTER — Observation Stay (HOSPITAL_COMMUNITY)
Admission: EM | Admit: 2014-12-13 | Discharge: 2014-12-15 | Disposition: A | Discharge status: Home / Self Care | Payer: Medicare Other | Attending: Internal Medicine | Admitting: Internal Medicine

## 2014-12-13 DIAGNOSIS — R42 Dizziness and giddiness: Secondary | ICD-10-CM

## 2014-12-13 DIAGNOSIS — K7460 Unspecified cirrhosis of liver: Secondary | ICD-10-CM | POA: Diagnosis present

## 2014-12-13 DIAGNOSIS — K2970 Gastritis, unspecified, without bleeding: Secondary | ICD-10-CM | POA: Diagnosis present

## 2014-12-13 DIAGNOSIS — K7030 Alcoholic cirrhosis of liver without ascites: Secondary | ICD-10-CM | POA: Diagnosis not present

## 2014-12-13 DIAGNOSIS — M25561 Pain in right knee: Secondary | ICD-10-CM

## 2014-12-13 DIAGNOSIS — Z79899 Other long term (current) drug therapy: Secondary | ICD-10-CM | POA: Diagnosis not present

## 2014-12-13 DIAGNOSIS — Z87891 Personal history of nicotine dependence: Secondary | ICD-10-CM | POA: Diagnosis not present

## 2014-12-13 DIAGNOSIS — K253 Acute gastric ulcer without hemorrhage or perforation: Secondary | ICD-10-CM | POA: Insufficient documentation

## 2014-12-13 DIAGNOSIS — E860 Dehydration: Secondary | ICD-10-CM | POA: Insufficient documentation

## 2014-12-13 DIAGNOSIS — I517 Cardiomegaly: Secondary | ICD-10-CM

## 2014-12-13 DIAGNOSIS — R404 Transient alteration of awareness: Secondary | ICD-10-CM | POA: Diagnosis not present

## 2014-12-13 DIAGNOSIS — F1020 Alcohol dependence, uncomplicated: Secondary | ICD-10-CM | POA: Diagnosis not present

## 2014-12-13 DIAGNOSIS — K922 Gastrointestinal hemorrhage, unspecified: Secondary | ICD-10-CM

## 2014-12-13 DIAGNOSIS — K219 Gastro-esophageal reflux disease without esophagitis: Secondary | ICD-10-CM | POA: Insufficient documentation

## 2014-12-13 DIAGNOSIS — M25461 Effusion, right knee: Secondary | ICD-10-CM | POA: Diagnosis not present

## 2014-12-13 DIAGNOSIS — T39395A Adverse effect of other nonsteroidal anti-inflammatory drugs [NSAID], initial encounter: Secondary | ICD-10-CM | POA: Diagnosis not present

## 2014-12-13 DIAGNOSIS — D696 Thrombocytopenia, unspecified: Secondary | ICD-10-CM | POA: Insufficient documentation

## 2014-12-13 DIAGNOSIS — B1710 Acute hepatitis C without hepatic coma: Secondary | ICD-10-CM | POA: Diagnosis present

## 2014-12-13 DIAGNOSIS — F1010 Alcohol abuse, uncomplicated: Secondary | ICD-10-CM | POA: Diagnosis not present

## 2014-12-13 DIAGNOSIS — D72819 Decreased white blood cell count, unspecified: Secondary | ICD-10-CM | POA: Diagnosis not present

## 2014-12-13 DIAGNOSIS — I471 Supraventricular tachycardia: Secondary | ICD-10-CM | POA: Diagnosis not present

## 2014-12-13 DIAGNOSIS — S8991XA Unspecified injury of right lower leg, initial encounter: Secondary | ICD-10-CM | POA: Diagnosis not present

## 2014-12-13 DIAGNOSIS — B1920 Unspecified viral hepatitis C without hepatic coma: Secondary | ICD-10-CM | POA: Insufficient documentation

## 2014-12-13 DIAGNOSIS — G629 Polyneuropathy, unspecified: Secondary | ICD-10-CM | POA: Diagnosis not present

## 2014-12-13 DIAGNOSIS — X58XXXA Exposure to other specified factors, initial encounter: Secondary | ICD-10-CM | POA: Insufficient documentation

## 2014-12-13 DIAGNOSIS — I10 Essential (primary) hypertension: Secondary | ICD-10-CM | POA: Insufficient documentation

## 2014-12-13 DIAGNOSIS — K2901 Acute gastritis with bleeding: Secondary | ICD-10-CM | POA: Diagnosis not present

## 2014-12-13 DIAGNOSIS — I4581 Long QT syndrome: Secondary | ICD-10-CM | POA: Insufficient documentation

## 2014-12-13 DIAGNOSIS — M25569 Pain in unspecified knee: Secondary | ICD-10-CM

## 2014-12-13 DIAGNOSIS — B182 Chronic viral hepatitis C: Secondary | ICD-10-CM

## 2014-12-13 DIAGNOSIS — R531 Weakness: Secondary | ICD-10-CM | POA: Diagnosis not present

## 2014-12-13 HISTORY — DX: Pain in right knee: M25.561

## 2014-12-13 HISTORY — DX: Dizziness and giddiness: R42

## 2014-12-13 HISTORY — DX: Gastrointestinal hemorrhage, unspecified: K92.2

## 2014-12-13 HISTORY — DX: Gastro-esophageal reflux disease without esophagitis: K21.9

## 2014-12-13 HISTORY — DX: Adverse effect of other nonsteroidal anti-inflammatory drugs (NSAID), initial encounter: T39.395A

## 2014-12-13 HISTORY — DX: Essential (primary) hypertension: I10

## 2014-12-13 LAB — COMPREHENSIVE METABOLIC PANEL
ALK PHOS: 106 U/L (ref 39–117)
ALT: 44 U/L (ref 0–53)
AST: 139 U/L — AB (ref 0–37)
Albumin: 3.4 g/dL — ABNORMAL LOW (ref 3.5–5.2)
Anion gap: 12 (ref 5–15)
BUN: 9 mg/dL (ref 6–23)
CALCIUM: 9.1 mg/dL (ref 8.4–10.5)
CHLORIDE: 106 mmol/L (ref 96–112)
CO2: 22 mmol/L (ref 19–32)
CREATININE: 0.8 mg/dL (ref 0.50–1.35)
GFR, EST NON AFRICAN AMERICAN: 86 mL/min — AB (ref >90)
Glucose, Bld: 126 mg/dL — ABNORMAL HIGH (ref 70–99)
Potassium: 4.1 mmol/L (ref 3.5–5.1)
SODIUM: 140 mmol/L (ref 135–145)
Total Bilirubin: 1.9 mg/dL — ABNORMAL HIGH (ref 0.3–1.2)
Total Protein: 7.5 g/dL (ref 6.0–8.3)

## 2014-12-13 LAB — CBC WITH DIFFERENTIAL/PLATELET
BASOS PCT: 1 % (ref 0–1)
Basophils Absolute: 0 10*3/uL (ref 0.0–0.1)
Eosinophils Absolute: 0 10*3/uL (ref 0.0–0.7)
Eosinophils Relative: 0 % (ref 0–5)
HEMATOCRIT: 40.1 % (ref 39.0–52.0)
Hemoglobin: 14.2 g/dL (ref 13.0–17.0)
LYMPHS PCT: 21 % (ref 12–46)
Lymphs Abs: 0.6 10*3/uL — ABNORMAL LOW (ref 0.7–4.0)
MCH: 34.5 pg — ABNORMAL HIGH (ref 26.0–34.0)
MCHC: 35.4 g/dL (ref 30.0–36.0)
MCV: 97.3 fL (ref 78.0–100.0)
Monocytes Absolute: 0.5 10*3/uL (ref 0.1–1.0)
Monocytes Relative: 18 % — ABNORMAL HIGH (ref 3–12)
NEUTROS PCT: 60 % (ref 43–77)
Neutro Abs: 1.7 10*3/uL (ref 1.7–7.7)
Platelets: DECREASED 10*3/uL (ref 150–400)
RBC: 4.12 MIL/uL — AB (ref 4.22–5.81)
RDW: 16.6 % — ABNORMAL HIGH (ref 11.5–15.5)
Smear Review: DECREASED
WBC: 2.8 10*3/uL — ABNORMAL LOW (ref 4.0–10.5)

## 2014-12-13 LAB — TYPE AND SCREEN
ABO/RH(D): O POS
Antibody Screen: NEGATIVE

## 2014-12-13 LAB — URINALYSIS, ROUTINE W REFLEX MICROSCOPIC
GLUCOSE, UA: NEGATIVE mg/dL
KETONES UR: 15 mg/dL — AB
Nitrite: NEGATIVE
Protein, ur: 30 mg/dL — AB
Specific Gravity, Urine: 1.022 (ref 1.005–1.030)
Urobilinogen, UA: 2 mg/dL — ABNORMAL HIGH (ref 0.0–1.0)
pH: 7.5 (ref 5.0–8.0)

## 2014-12-13 LAB — I-STAT TROPONIN, ED: TROPONIN I, POC: 0.02 ng/mL (ref 0.00–0.08)

## 2014-12-13 LAB — CBC
HEMATOCRIT: 37.1 % — AB (ref 39.0–52.0)
HEMOGLOBIN: 13.5 g/dL (ref 13.0–17.0)
MCH: 35.2 pg — AB (ref 26.0–34.0)
MCHC: 36.4 g/dL — AB (ref 30.0–36.0)
MCV: 96.9 fL (ref 78.0–100.0)
Platelets: 77 10*3/uL — ABNORMAL LOW (ref 150–400)
RBC: 3.83 MIL/uL — AB (ref 4.22–5.81)
RDW: 16.5 % — ABNORMAL HIGH (ref 11.5–15.5)
WBC: 2.3 10*3/uL — ABNORMAL LOW (ref 4.0–10.5)

## 2014-12-13 LAB — URINE MICROSCOPIC-ADD ON

## 2014-12-13 LAB — POC OCCULT BLOOD, ED: Fecal Occult Bld: POSITIVE — AB

## 2014-12-13 LAB — I-STAT CG4 LACTIC ACID, ED
LACTIC ACID, VENOUS: 2.67 mmol/L — AB (ref 0.5–2.0)
Lactic Acid, Venous: 1.82 mmol/L (ref 0.5–2.0)

## 2014-12-13 LAB — PROTIME-INR
INR: 1.2 (ref 0.00–1.49)
Prothrombin Time: 15.3 seconds — ABNORMAL HIGH (ref 11.6–15.2)

## 2014-12-13 MED ORDER — LORAZEPAM 1 MG PO TABS: 1.0000 mg | ORAL_TABLET | Freq: Four times a day (QID) | ORAL | Status: DC | PRN

## 2014-12-13 MED ORDER — SODIUM CHLORIDE 0.9 % IV BOLUS (SEPSIS)
1000.0000 mL | INTRAVENOUS | Status: AC
  Administered 2014-12-13: 1000 mL via INTRAVENOUS

## 2014-12-13 MED ORDER — BOOST / RESOURCE BREEZE PO LIQD
1.0000 | Freq: Three times a day (TID) | ORAL | Status: DC
  Administered 2014-12-13 – 2014-12-15 (×4): 1 via ORAL

## 2014-12-13 MED ORDER — FOLIC ACID 1 MG PO TABS
1.0000 mg | ORAL_TABLET | Freq: Every day | ORAL | Status: DC
  Administered 2014-12-13 – 2014-12-15 (×2): 1 mg via ORAL
  Filled 2014-12-13 (×4): qty 1

## 2014-12-13 MED ORDER — SODIUM CHLORIDE 0.9 % IV SOLN
INTRAVENOUS | Status: AC
  Administered 2014-12-13 – 2014-12-14 (×2): via INTRAVENOUS
  Administered 2014-12-14: 1000 mL via INTRAVENOUS

## 2014-12-13 MED ORDER — PANTOPRAZOLE SODIUM 40 MG IV SOLR
40.0000 mg | Freq: Once | INTRAVENOUS | Status: AC
  Administered 2014-12-13: 40 mg via INTRAVENOUS
  Filled 2014-12-13: qty 40

## 2014-12-13 MED ORDER — ACETAMINOPHEN 325 MG PO TABS
650.0000 mg | ORAL_TABLET | Freq: Four times a day (QID) | ORAL | Status: DC | PRN
Start: 2014-12-13 — End: 2014-12-15

## 2014-12-13 MED ORDER — LORAZEPAM 2 MG/ML IJ SOLN
0.5000 mg | Freq: Once | INTRAMUSCULAR | Status: AC
  Administered 2014-12-13: 0.5 mg via INTRAVENOUS
  Filled 2014-12-13: qty 1

## 2014-12-13 MED ORDER — ONDANSETRON HCL 4 MG PO TABS: 4.0000 mg | ORAL_TABLET | Freq: Four times a day (QID) | ORAL | Status: DC | PRN

## 2014-12-13 MED ORDER — PANTOPRAZOLE SODIUM 40 MG IV SOLR
40.0000 mg | Freq: Two times a day (BID) | INTRAVENOUS | Status: DC
  Filled 2014-12-13 (×2): qty 40

## 2014-12-13 MED ORDER — ADENOSINE 6 MG/2ML IV SOLN
6.0000 mg | Freq: Once | INTRAVENOUS | Status: DC
  Filled 2014-12-13: qty 2

## 2014-12-13 MED ORDER — LORAZEPAM 1 MG PO TABS: 0.0000 mg | ORAL_TABLET | Freq: Four times a day (QID) | ORAL | Status: DC

## 2014-12-13 MED ORDER — ACETAMINOPHEN 650 MG RE SUPP: 650.0000 mg | Freq: Four times a day (QID) | RECTAL | Status: DC | PRN

## 2014-12-13 MED ORDER — ALBUTEROL SULFATE (2.5 MG/3ML) 0.083% IN NEBU: 2.5000 mg | INHALATION_SOLUTION | RESPIRATORY_TRACT | Status: DC | PRN

## 2014-12-13 MED ORDER — LORAZEPAM 1 MG PO TABS: 0.0000 mg | ORAL_TABLET | Freq: Two times a day (BID) | ORAL | Status: DC

## 2014-12-13 MED ORDER — PANTOPRAZOLE SODIUM 40 MG PO TBEC
40.0000 mg | DELAYED_RELEASE_TABLET | Freq: Two times a day (BID) | ORAL | Status: DC
  Administered 2014-12-13 – 2014-12-15 (×4): 40 mg via ORAL
  Filled 2014-12-13 (×4): qty 1

## 2014-12-13 MED ORDER — LORAZEPAM 2 MG/ML IJ SOLN: 1.0000 mg | Freq: Four times a day (QID) | INTRAMUSCULAR | Status: DC | PRN

## 2014-12-13 MED ORDER — ADULT MULTIVITAMIN W/MINERALS CH
1.0000 | ORAL_TABLET | Freq: Every day | ORAL | Status: DC
  Administered 2014-12-13 – 2014-12-15 (×2): 1 via ORAL
  Filled 2014-12-13 (×3): qty 1

## 2014-12-13 MED ORDER — VITAMIN B-1 100 MG PO TABS
100.0000 mg | ORAL_TABLET | Freq: Every day | ORAL | Status: DC
  Administered 2014-12-13 – 2014-12-15 (×2): 100 mg via ORAL
  Filled 2014-12-13 (×3): qty 1

## 2014-12-13 MED ORDER — ONDANSETRON HCL 4 MG/2ML IJ SOLN: 4.0000 mg | Freq: Four times a day (QID) | INTRAMUSCULAR | Status: DC | PRN

## 2014-12-13 MED ORDER — THIAMINE HCL 100 MG/ML IJ SOLN
100.0000 mg | Freq: Every day | INTRAMUSCULAR | Status: DC
  Filled 2014-12-13 (×3): qty 1

## 2014-12-13 MED ORDER — OXYCODONE HCL 5 MG PO TABS
5.0000 mg | ORAL_TABLET | ORAL | Status: DC | PRN
  Administered 2014-12-14 – 2014-12-15 (×2): 5 mg via ORAL
  Filled 2014-12-13 (×2): qty 1

## 2014-12-13 MED ORDER — ZOLPIDEM TARTRATE 5 MG PO TABS
5.0000 mg | ORAL_TABLET | Freq: Every evening | ORAL | Status: DC | PRN
  Administered 2014-12-13 – 2014-12-14 (×2): 5 mg via ORAL
  Filled 2014-12-13 (×2): qty 1

## 2014-12-13 MED ORDER — SODIUM CHLORIDE 0.9 % IJ SOLN: 3.0000 mL | Freq: Two times a day (BID) | INTRAMUSCULAR | Status: DC

## 2014-12-13 NOTE — ED Provider Notes (Signed)
CSN: [id number]     Arrival date & time 12/13/14  1020 History   First MD Initiated Contact with Patient 12/13/14 1105     Chief Complaint  Patient presents with  . Dizziness  . Fatigue  . Rectal Bleeding     (Consider location/radiation/quality/duration/timing/severity/associated sxs/prior Treatment) HPI   74 year old male with history of alcohol abuse, hepatitis C, liver cirrhosis who is brought here via EMS for evaluation of generalized weakness and dizziness. Patient reports he may have bumped his right knee over a week ago and has had pain to his right knee since. Describe pain as a sharp aching sensation worsening with movement. He has been taken Aleve and Motrin for his pain which has not helped. He has not noticed having abdominal pain or back pain but does report increased bowel movement. States he has noticed 5-6 bouts of dark black stools daily, and ongoing for the past week. Reports feeling weak, tired, with lightheadedness sensation. He denies having headache, nausea, vomiting, chest pain, shortness of breath, productive cough, hemoptysis, hematemesis, hematochezia, dysuria. He does admits to being easily bruised. Report prior history of IV drug use. Also reported his last alcohol use was a week ago.    Past Medical History  Diagnosis Date  . Neuropathy   . Hypertension   . Hepatitis C   . 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 Alcohol abuse    Past Surgical History  Procedure Laterality Date  . Esophagogastroduodenoscopy  06/07/2012    Procedure: ESOPHAGOGASTRODUODENOSCOPY (EGD);  Surgeon: Milus Banister, MD;  Location: [place];  Service: Endoscopy;  Laterality: N/A;  may need treatment of varices   Family History  Problem Relation Age of Onset  . Stroke Father   . Prostate cancer Brother    History  Substance Use Topics  . Smoking status: Former Research scientist (life sciences)  . Smokeless tobacco: Not on file  . Alcohol Use: No     Comment: per pt he quit drinking 1 month ago    Review of  Systems  All other systems reviewed and are negative.     Allergies  Review of patient's allergies indicates no known allergies.  Home Medications   Prior to Admission medications   Medication Sig Start Date End Date Taking? Authorizing Provider  chlordiazePOXIDE (LIBRIUM) 10 MG capsule Take 10 mg by mouth daily.    Historical Provider, MD  omeprazole (PRILOSEC) 20 MG capsule Take 1 capsule (20 mg total) by mouth daily. For acid reflux 02/05/14   Encarnacion Slates, NP  PRESCRIPTION MEDICATION Take 1 tablet by mouth. Blood pressure medication    Historical Provider, MD  traZODone (DESYREL) 100 MG tablet Take 1 tablet (100 mg total) by mouth at bedtime as needed for sleep. 02/05/14   Encarnacion Slates, NP   BP 150/99 mmHg  Pulse 129  Resp 18  SpO2 97% Physical Exam  Constitutional: He is oriented to person, place, and time. He appears well-developed and well-nourished.  Chronically ill-appearing African-American male laying in bed.  HENT:  Head: Atraumatic.  Patient is edentulous  Eyes: Conjunctivae are normal.  Neck: Normal range of motion. Neck supple.  Cardiovascular:  Tachycardia without murmurs rubs or gallops  Pulmonary/Chest: Effort normal and breath sounds normal. He exhibits no tenderness.  Abdominal: Soft. Bowel sounds are normal. He exhibits no distension. There is no tenderness. There is no rebound and no guarding.  Genitourinary:  Chaperone present during exam. Normal rectal tone, no obvious mass, brown color stool, hemoccult positive.  Musculoskeletal: He exhibits  tenderness (right knee: Knee is mildly swollen without warmth or erythema. Tenderness noted to lateral joint line. Negative anterior posterior drawer test. Mild pain with valgus maneuver.).  Neurological: He is alert and oriented to person, place, and time.  Skin: No rash noted.  Psychiatric: He has a normal mood and affect.  Nursing note and vitals reviewed.   ED Course  Procedures (including critical care  time)  Patient here with rectal bleeding concerning for upper GI bleed. Report generalized fatigue and lightheadedness. He is tachycardic, hemodynamically stable. Suspect upper GI bleed likely secondary to NSAID use. He has a benign abdominal exam. He is not actively bleeding. He has had a prior EGD without any significant evidence of esophageal varices.  He reports stopping drinking alcohol a week ago.  Work up initiated.  Care discussed with Dr. Aline Brochure.   1:39 PM Pt has brown color stool, but hemoccult positive.  He was tachycardic but improves with IVF.  Hgb 14.2.  Elevated lactic acid of 2.67. Elevated AST of 139, total bili 1.9.  Does not take iron supplementation.  Xray of R knee without acute finding.    Appreciated consultation with [facility], Dr. Algis Liming who agrees to admits to obs, tele for further care.    2:24 PM Nurse notified me that pt is tachycardic in the 150s on monitor.  ECG obtained showing wide complex tachycardia with RBBB.  Pt is not symptomatic at this time.  We performed vasovagal maneuver without relief. Pt sts if he can talk to his wife it will get better.  Wife did call and as pt talk on the phone his HR improves to the 110s.  Suspect transient SVT.  Dr. Algis Liming made aware.  Will repeat ECG.   BP 147/89 mmHg  Pulse 100  Temp(Src) 98.5 F (36.9 C) (Oral)  Resp 23  SpO2 99%   Labs Review Labs Reviewed  CBC WITH DIFFERENTIAL/PLATELET - Abnormal; Notable for the following:    WBC 2.8 (*)    RBC 4.12 (*)    MCH 34.5 (*)    RDW 16.6 (*)    Monocytes Relative 18 (*)    Lymphs Abs 0.6 (*)    All other components within normal limits  COMPREHENSIVE METABOLIC PANEL - Abnormal; Notable for the following:    Glucose, Bld 126 (*)    Albumin 3.4 (*)    AST 139 (*)    Total Bilirubin 1.9 (*)    GFR calc non Af Amer 86 (*)    All other components within normal limits  PROTIME-INR - Abnormal; Notable for the following:    Prothrombin Time 15.3 (*)    All  other components within normal limits  I-STAT CG4 LACTIC ACID, ED - Abnormal; Notable for the following:    Lactic Acid, Venous 2.67 (*)    All other components within normal limits  POC OCCULT BLOOD, ED - Abnormal; Notable for the following:    Fecal Occult Bld POSITIVE (*)    All other components within normal limits  URINALYSIS, ROUTINE W REFLEX MICROSCOPIC  TYPE AND SCREEN    Imaging Review Dg Knee Complete 4 Views Right  12/13/2014   CLINICAL DATA:  Fall 1 week ago, right knee pain  EXAM: RIGHT KNEE - COMPLETE 4+ VIEW  COMPARISON:  None.  FINDINGS: Four views of the right knee submitted. No acute fracture or subluxation. There is diffuse osteopenia. Minimal narrowing of medial joint compartment. Mild narrowing of patellofemoral joint space. Small joint effusion.  IMPRESSION:  No acute fracture or subluxation. Mild degenerate changes. Diffuse osteopenia.   Electronically Signed   By: Lahoma Crocker M.D.   On: 12/13/2014 13:16     EKG Interpretation   Date/Time:  Thursday December 13 2014 10:42:47 EST Ventricular Rate:  129 PR Interval:  114 QRS Duration: 115 QT Interval:  339 QTC Calculation: 497 R Axis:   -157 Text Interpretation:  Sinus tachycardia Incomplete right bundle branch  block No significant change since last tracing Confirmed by HARRISON  MD,  FORREST ([id number]) on 12/13/2014 11:04:03 AM      MDM   Final diagnoses:  Knee pain  Dehydration  GI bleed due to NSAIDs   BP 147/89 mmHg  Pulse 100  Temp(Src) 98.5 F (36.9 C) (Oral)  Resp 23  SpO2 99%  I have reviewed nursing notes and vital signs. I personally reviewed the imaging tests through PACS system  I reviewed available ER/hospitalization records thought the EMR   BP 147/89 mmHg  Pulse 100  Temp(Src) 98.5 F (36.9 C) (Oral)  Resp 23  SpO2 99%     Domenic Moras, PA-C 12/13/14 [place], PA-C 12/13/14 1720  Pamella Pert, MD 12/13/14 1723

## 2014-12-13 NOTE — H&P (Signed)
History and Physical  Nathan Frank VQM:[id number] DOB: 1941/01/16 DOA: 12/13/2014  Referring physician: Domenic Moras, ED PA-C PCP: Pcp Not In System  Outpatient Specialists:  1. None  Chief Complaint: Dizziness, loose dark stools and right knee pain.  HPI: Nathan Frank is a 74 y.o. male with history of alcohol dependence, polysubstance abuse/history of heroin OD, alcoholic and hepatitis C cirrhosis, lacks PCP, presented to ED with above complaints. He gets one week history of right knee pain. He indicates that he might have fallen while under the influence of alcohol but cannot be sure if he sustained injury to the right knee. Pain is intermittent, severe, nonradiating, worse with weightbearing and movements, mild swelling but no redness, fever or chills. Patient has been using liberal ibuprofen/Naprosyn for pain with some relief. He also gives 4 days history of dark loose stools up to 4-5 times daily but no bright red blood. He states that this has started to clear up in the ED. Denies nausea, vomiting or abdominal pain. Appetite has decreased. He also complains of feeling dizzy and lightheaded but denies passing out. He is unable to sleep at night because "I think I'm having withdrawal". In the ED, a rectal exam by ED PA showed brown stools and FOBT +. Hemoglobin 14.2 and WBC 2.8. As per ED PA, patient transiently was in SVT 150s which was asymptomatic and he reverted spontaneously to sinus tachycardia in the 110s. Hospitalist admission requested.   Review of Systems: All systems reviewed and apart from history of presenting illness, are negative.  Past Medical History  Diagnosis Date  . Neuropathy   . Hypertension   . Hepatitis C   . 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 Alcohol abuse    Past Surgical History  Procedure Laterality Date  . Esophagogastroduodenoscopy  06/07/2012    Procedure: ESOPHAGOGASTRODUODENOSCOPY (EGD);  Surgeon: Milus Banister, MD;  Location: [place];  Service: Endoscopy;   Laterality: N/A;  may need treatment of varices   Social History:  reports that he has quit smoking. He does not have any smokeless tobacco history on file. He reports that he does not drink alcohol or use illicit drugs. Married. Independent of activities of daily living.  No Known Allergies  Family History  Problem Relation Age of Onset  . Stroke Father   . Prostate cancer Brother     Prior to Admission medications   Medication Sig Start Date End Date Taking? Authorizing Provider  chlordiazePOXIDE (LIBRIUM) 5 MG capsule Take 5 mg by mouth daily as needed for anxiety.  09/20/14  Yes Historical Provider, MD  ibuprofen (ADVIL,MOTRIN) 200 MG tablet Take 200 mg by mouth every 6 (six) hours as needed for mild pain or moderate pain.   Yes Historical Provider, MD  Multiple Vitamin (ONE-A-DAY MENS PO) Take 1 tablet by mouth daily.   Yes Historical Provider, MD  naproxen sodium (ANAPROX) 220 MG tablet Take 440 mg by mouth daily as needed (pain).   Yes Historical Provider, MD  omeprazole (PRILOSEC) 20 MG capsule Take 1 capsule (20 mg total) by mouth daily. For acid reflux Patient taking differently: Take 20 mg by mouth daily as needed (acid reflux). For acid reflux 02/05/14  Yes Encarnacion Slates, NP  traZODone (DESYREL) 100 MG tablet Take 1 tablet (100 mg total) by mouth at bedtime as needed for sleep. 02/05/14  Yes Encarnacion Slates, NP   Physical Exam: Filed Vitals:   12/13/14 1315 12/13/14 1330 12/13/14 1345 12/13/14 1430  BP: 160/89 153/86  150/96 145/88  Pulse: 96 96 101 107  Temp:      TempSrc:      Resp: 24 17 24 14   SpO2: 96% 96% 100% 99%   temperature 98.21F.   General exam: Moderately built and nourished pleasant elderly male patient, lying comfortably supine on the gurney in no obvious distress.  Head, eyes and ENT: Nontraumatic and normocephalic. Pupils equally reacting to light and accommodation. Oral mucosa moist.  Neck: Supple. No JVD, carotid bruit or thyromegaly.  Lymphatics: No  lymphadenopathy.  Respiratory system: Clear to auscultation. No increased work of breathing.  Cardiovascular system: S1 and S2 heard, RRR. No JVD, murmurs, gallops, clicks or pedal edema.  Gastrointestinal system: Abdomen is nondistended, soft and nontender. Normal bowel sounds heard. No organomegaly or masses appreciated.  Central nervous system: Alert and oriented. No focal neurological deficits.  Extremities: Symmetric 5 x 5 power. Peripheral pulses symmetrically felt. Right knee mildly swollen and warm without erythema. Mild tenderness and painful range of movements. No tremors.  Skin: No rashes or acute findings.  Musculoskeletal system: Negative exam.  Psychiatry: Pleasant and cooperative.   Labs on Admission:  Basic Metabolic Panel:  Recent Labs Lab 12/13/14 1126  NA 140  K 4.1  CL 106  CO2 22  GLUCOSE 126*  BUN 9  CREATININE 0.80  CALCIUM 9.1   Liver Function Tests:  Recent Labs Lab 12/13/14 1126  AST 139*  ALT 44  ALKPHOS 106  BILITOT 1.9*  PROT 7.5  ALBUMIN 3.4*   No results for input(s): LIPASE, AMYLASE in the last 168 hours. No results for input(s): AMMONIA in the last 168 hours. CBC:  Recent Labs Lab 12/13/14 1126  WBC 2.8*  NEUTROABS 1.7  HGB 14.2  HCT 40.1  MCV 97.3  PLT PLATELETS APPEAR DECREASED   Cardiac Enzymes: No results for input(s): CKTOTAL, CKMB, CKMBINDEX, TROPONINI in the last 168 hours.  BNP (last 3 results) No results for input(s): PROBNP in the last 8760 hours. CBG: No results for input(s): GLUCAP in the last 168 hours.  Radiological Exams on Admission: Dg Knee Complete 4 Views Right  12/13/2014   CLINICAL DATA:  Fall 1 week ago, right knee pain  EXAM: RIGHT KNEE - COMPLETE 4+ VIEW  COMPARISON:  None.  FINDINGS: Four views of the right knee submitted. No acute fracture or subluxation. There is diffuse osteopenia. Minimal narrowing of medial joint compartment. Mild narrowing of patellofemoral joint space. Small joint  effusion.  IMPRESSION: No acute fracture or subluxation. Mild degenerate changes. Diffuse osteopenia.   Electronically Signed   By: Lahoma Crocker M.D.   On: 12/13/2014 13:16    EKG: Independently reviewed. Wide complex tachycardia at 155 bpm, RBBB (old) and prolonged QTC 551 ms.  Assessment/Plan Principal Problem:   GI bleed due to NSAIDs Active Problems:   Alcohol abuse   Hepatitis C   Cirrhosis   Gastritis   Alcohol dependence   Right knee pain   Dizziness and giddiness   Upper GI bleeding   1. Upper GI bleed: Reported history of black loose stools in the context of alcohol dependence and NSAID use. Rectal exam positive for brown stools but FOBT +. Hemoglobin normal but may be hemoconcentrated from some dehydration. Avoid NSAIDs. IV PPI. Clear liquid diet. EGD 06/07/12 showed mild gastritis without evidence of portal hypertension. [facility] consulted. Monitor CBCs closely. 2. Alcohol dependence: Possible early withdrawal. Place on Ativan protocol and monitor closely. 3. Right knee pain, acute: No definitive history of  trauma. X-ray without acute findings. DD-OA, gout, pseudogout. Treat symptomatically/supportively and monitor. 4. Transient SVT: Asymptomatic. Monitor on telemetry. Check TSH and 2-D echo given history of alcohol abuse. Check magnesium. Potassium normal. 5. History of hepatitis C: Outpatient follow-up. Currently not on treatment. 6. Leukopenia and thrombocytopenia: Likely secondary to alcohol dependence. Follow CBCs. 7. Dizziness and giddiness:? Related to problem #1. Orthostatic blood pressures. IV fluids and monitor. 8. Prolonged QTC: Monitor on telemetry. Check and replace magnesium as needed. Check 2-D echocardiogram. 9. History of hepatitis C and alcoholic cirrhosis: Alcohol cessation counseled. Outpatient follow-up regarding hepatitis C.     Code Status: Full  Family Communication: None at bedside  Disposition Plan: Home when medically stable   Time spent: 65  minutes.  Nathan Leep, MD, FACP, FHM. [facility] Pager [phone]  If 7PM-7AM, please contact night-coverage [email or web address] Password [email or web address] 12/13/2014, 2:40 PM

## 2014-12-13 NOTE — ED Notes (Signed)
Pt brought here to Ed via EMS for dizziness and weakness that has been occurring for 1 week. Pt reports 5-6 dark black stools for 1 week also. Hr at 127 at this time. A/Ox4. No neuro deficits noted.

## 2014-12-13 NOTE — Evaluation (Signed)
Physical Therapy Evaluation Patient Details Name: Nathan Frank MRN: [id number] DOB: [birth year] Today's Date: 12/13/2014   History of Present Illness  73 y.o. male admitted to [facility] on 12/13/14 for dizziness, loose dark stools, and right knee pain.  Pt dx with upper GIB, right knee pain, and transient SVT.  Pt with significant PMHx of neuropathy, HTN, Hepatitis C, cirrhosis, alcohol abuse, and poly substance abuse.  Clinical Impression  Pt is mobilizing close to his baseline with gait deviations due to right knee pain.  We simulated cane use and pt is able to safely maneuver with very little assist from the PT. HEP provided for LE strength and mobility training.  We will follow acutely for further assessment of gait with an actual cane, exercise progression, modalities for pain, but he will not need f/u PT or any equipment after d/c from the acute hospital setting.  PT to follow acutely for deficits listed below.       Follow Up Recommendations No PT follow up    Equipment Recommendations  None recommended by PT    Recommendations for Other Services   NA    Precautions / Restrictions Precautions Precautions: Fall Precaution Comments: due to right knee pain, antalgic gait pattern.       Mobility  Bed Mobility Overal bed mobility: Modified Independent             General bed mobility comments: pt needs extra time and uses railing for leverage to get to sitting EOB.   Transfers Overall transfer level: Needs assistance Equipment used: 1 person hand held assist Transfers: Sit to/from Stand Sit to Stand: Min guard         General transfer comment: min guard assist to steady pt for balance during transitions.   Ambulation/Gait Ambulation/Gait assistance: Min guard Ambulation Distance (Feet): 150 Feet Assistive device: 1 person hand held assist Gait Pattern/deviations: Step-through pattern;Antalgic Gait velocity: decreased Gait velocity interpretation: Below normal speed for  age/gender General Gait Details: min guard assist simulating cane on his right side (per pt report this is where he carries his cane at home-he is right handed).  At times pt reaching for railing, but generally slow, steady gait with minimal assist.          Balance Overall balance assessment: Needs assistance Sitting-balance support: No upper extremity supported;Feet supported Sitting balance-Leahy Scale: Good     Standing balance support: Single extremity supported;Bilateral upper extremity supported;No upper extremity supported Standing balance-Leahy Scale: Good                               Pertinent Vitals/Pain Pain Assessment: 0-10 Pain Score: 8  Pain Location: right knee Pain Descriptors / Indicators: Aching;Burning Pain Intervention(s): Limited activity within patient's tolerance;Monitored during session;Repositioned (pain mostly with mobility and gait, improved with rest)    Home Living Family/patient expects to be discharged to:: Private residence Living Arrangements: Spouse/significant other Available Help at Discharge: Friend(s);Available 24 hours/day Type of Home: House Home Access: Stairs to enter   [employer] of Steps: 3 Home Layout: One level Home Equipment: Cane - single point      Prior Function Level of Independence: Independent with assistive device(s)         Comments: Pt ambulated with cane PTA        Extremity/Trunk Assessment   Upper Extremity Assessment: Overall WFL for tasks assessed           Lower Extremity Assessment:  RLE deficits/detail RLE Deficits / Details: right leg with decreased full knee extension by ~8 degrees and decreased strength functionally 4/5 with visble muscle fatigue during seated exercises on right>left    Cervical / Trunk Assessment: Normal  Communication   Communication: No difficulties;Other (comment) (doesn't say  much. )  Cognition Arousal/Alertness: Awake/alert Behavior During  Therapy: Flat affect Overall Cognitive Status: Within Functional Limits for tasks assessed                         Exercises General Exercises - Lower Extremity Long Arc Quad: AROM;Both;10 reps;Seated Hip ABduction/ADduction: AROM;Both;10 reps;Seated (adduction only against pillow for resistance) Hip Flexion/Marching: AROM;Both;10 reps;Seated Toe Raises: AROM;Both;10 reps;Seated Heel Raises: AROM;Both;10 reps;Seated      Assessment/Plan    PT Assessment Patient needs continued PT services  PT Diagnosis Difficulty walking;Generalized weakness;Abnormality of gait;Acute pain   PT Problem List Decreased strength;Decreased range of motion;Decreased activity tolerance;Decreased balance;Decreased mobility;Decreased knowledge of use of DME;Pain  PT Treatment Interventions DME instruction;Gait training;Stair training;Functional mobility training;Therapeutic activities;Therapeutic exercise;Neuromuscular re-education;Balance training;Patient/family education;Modalities   PT Goals (Current goals can be found in the Care Plan section) Acute Rehab PT Goals Patient Stated Goal: to go home, decrease right knee pain PT Goal Formulation: With patient Time For Goal Achievement: 12/20/14 Potential to Achieve Goals: Good    Frequency Min 3X/week           End of Session   Activity Tolerance: Patient limited by pain;Patient limited by fatigue Patient left: in bed;with bed alarm set;with call bell/phone within reach Nurse Communication: Mobility status;Other (comment) (orthostatics taken, O2 left off due to good sats)    Functional Assessment Tool Used: assist level Functional Limitation: Mobility: Walking and moving around Mobility: Walking and Moving Around Current Status (Z5638): At least 1 percent but less than 20 percent impaired, limited or restricted Mobility: Walking and Moving Around Goal Status [phone]): 0 percent impaired, limited or restricted    Time: 1630-1700 PT Time  Calculation (min) (ACUTE ONLY): 30 min   Charges:   PT Evaluation $Initial PT Evaluation Tier I: 1 Procedure PT Treatments $Gait Training: 8-22 mins   PT G Codes:   PT G-Codes **NOT FOR INPATIENT CLASS** Functional Assessment Tool Used: assist level Functional Limitation: Mobility: Walking and moving around Mobility: Walking and Moving Around Current Status (P2951): At least 1 percent but less than 20 percent impaired, limited or restricted Mobility: Walking and Moving Around Goal Status [phone]): 0 percent impaired, limited or restricted    Kristinia Leavy B. [place], [place], DPT [phone]   12/13/2014, 5:23 PM

## 2014-12-13 NOTE — Consult Note (Signed)
[facility] Consult: 2:34 PM 12/13/2014     Referring Provider: Dr Algis Liming  Primary Care Physician:  Has been seen at [facility] in [place] and [place].  It is difficult for him to get to the [place] because he doesn't have transportation. More recently he has been followed by Dr. Thressa Sheller.   Primary Gastroenterologist:  Althia Forts.     Reason for Consultation:  Black stools.   HPI: Nathan Frank is a 74 y.o. male.  Patient has a history of alcoholism, cirrhosis, hepatitis C. In 06/2012 he underwent inpatient EGD for variceal screening in light of an acute DVT which would require future anticoagulation.Marland Kitchen He was not actively bleeding and he was FOBT negative at the time but did have a drop in his hemoglobin to 7.5.  EGD confirmed mild gastritis, no evidence for portal hypertension or varices. During that 06/2012 admission he received 2 units of PRBC. Severe protein calorie malnutrition was also noted. In around 2012 the patient had undergone EGD and colonoscopy through the [place]. Agent no longer uses anticoagulants. On an admission in 12/2013 with abdominal pain. He had reconfirmation of gallstones on imaging. However his HIDA scan was normal.  Psych notes from before of 2015 note that he was drinking 2 bottles of wine daily, had recently used heroin. He had and are gone detox at the [place] in late 2014.  He was admitted for 3 days in May 2015 to [facility] for alcohol detox.  Within a couple of weeks he was admitted to [facility] with heroin overdose.  This was treated with Narcan. Labs in 02/2014 showed a hemoglobin of about 11-1/2. Platelets were reduced to the 120s.  AST was up to 106.  Patient presents to the ED today.  He's been having weakness and dizziness for about one week. He's had 5 or 6  black stools daily during the past week. He has been taking Aleve 4 times daily for the last 2 days. In addition he took 2 ibuprofen yesterday. These meds are for right knee pain, which occurred after he bumped it.  X-rays in the ER were negative for any trauma. Hemoglobin is 14.2, MCV 97. Platelets are clumped but appeared decreased.  AST/ALT 139/44. Total bilirubin 1.9. Alkaline phosphatase 106. PT 15.3, INR 1.2. On past assays coags have been normal.  FOBT positive on a brown stool. He says that his most recent bowel movement has been brown and is no longer dark..   As far as alcohol intake goes, the patient drinks 1-2 pints of fortified wine daily. He stopped drinking about a week ago, says he just got tired of it. He hasn't had any nausea or vomiting. No abdominal pain. No increase reflux disease. No dysphagia.  Appetite has been decreased for about a week and he may have lost a couple of pounds but overall he feels like his weight has gone up within the last year, previously he was quite thin and malnourished.     Past Medical History  Diagnosis Date  . Neuropathy   . Hypertension   .  Hepatitis C   . 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 Alcohol abuse     Past Surgical History  Procedure Laterality Date  . Esophagogastroduodenoscopy  06/07/2012    Procedure: ESOPHAGOGASTRODUODENOSCOPY (EGD);  Surgeon: Milus Banister, MD;  Location: [facility];  Service: Endoscopy;  Laterality: N/A;  may need treatment of varices    Prior to Admission medications   Medication Sig Start Date End Date Taking? Authorizing Provider  chlordiazePOXIDE (LIBRIUM) 5 MG capsule Take 5 mg by mouth daily as needed for anxiety.  09/20/14  Yes Historical Provider, MD  ibuprofen (ADVIL,MOTRIN) 200 MG tablet Take 200 mg by mouth every 6 (six) hours as needed for mild pain or moderate pain.   Yes Historical Provider, MD  Multiple Vitamin (ONE-A-DAY MENS PO) Take 1 tablet by mouth daily.   Yes Historical Provider, MD  naproxen  sodium (ANAPROX) 220 MG tablet Take 440 mg by mouth daily as needed (pain).   Yes Historical Provider, MD  omeprazole (PRILOSEC) 20 MG capsule Take 1 capsule (20 mg total) by mouth daily. For acid reflux Patient taking differently: Take 20 mg by mouth daily as needed (acid reflux). For acid reflux 02/05/14  Yes Encarnacion Slates, NP  traZODone (DESYREL) 100 MG tablet Take 1 tablet (100 mg total) by mouth at bedtime as needed for sleep. 02/05/14  Yes Encarnacion Slates, NP    Scheduled Meds:  Infusions:  PRN Meds:    Allergies as of 12/13/2014  . (No Known Allergies)    Family History  Problem Relation Age of Onset  . Stroke Father   . Prostate cancer Brother     History   Social History  . Marital Status: Single    Spouse Name: N/A  . Number of Children: N/A  . Years of Education: N/A   Occupational History  . Not on file.   Social History Main Topics  . Smoking status: Former Research scientist (life sciences)  . Smokeless tobacco: Not on file  . Alcohol Use: No     Comment: per pt he quit drinking 1 month ago  . Drug Use: No     Comment: heroin  . Sexual Activity: Yes    Birth Control/ Protection: Condom   Other Topics Concern  . Not on file   Social History Narrative    REVIEW OF SYSTEMS: Constitutional:  Some weakness, no significant fatigue. ENT:  No nose bleeds Pulm:  No cough, no DOE. CV:  No palpitations, no LE edema. No chest pain  GU:  No hematuria, no frequency GI:  Per HPI Heme:  Per HPI.  No unusual bruising or bleeding.   Transfusions:  Per HPI  Neuro:  No headaches, no peripheral tingling or numbness.  No seizures. No blackout Derm:  No itching, no rash or sores.  Endocrine:  No sweats or chills.  No polyuria or dysuria Immunization:  Not queried. Travel:  None beyond local counties in last few months.    PHYSICAL EXAM: Vital signs in last 24 hours: Filed Vitals:   12/13/14 1430  BP: 145/88  Pulse: 107  Temp:   Resp: 14   Wt Readings from Last 3 Encounters:    02/21/14 182 lb 1.6 oz (82.6 kg)  02/02/14 178 lb (80.74 kg)  12/15/13 168 lb 6.4 oz (76.386 kg)    General: Pleasant, comfortable, AAM. He looks his age. He doesn't look acutely ill Head:  No signs of head trauma. No facial asymmetry or edema.  Eyes:  No scleral icterus. No conjunctival  pallor. Ears:  No obvious hearing loss.  Nose:  No congestion or discharge. Mouth:  Only lower front incisors remaining. No dentures. Gums are pink, clear and moist. Neck:  No JVD, no TMG. No masses. Lungs:  CTA bil.  No cough, no dyspnea. Heart: RRR. No MRG. S1/S2 audible. Abdomen:  Soft,NT, ND.  No masses. No HSM. No bruits. No hernias. Bowel sounds active..   Rectal: Stool was brown but FOBT positive in the emergency room. Did not repeat study.   Musc/Skeltl: No joint swelling or redness. No contracture deformities. Extremities:  No CCE. Feet are warm  Neurologic:  Oriented 3. Appropriate. Alert.  Limb strength full. Skin:  No telangiectasia, no rashes, no sores Tattoos:  None seen Nodes:  No cervical adenopathy.   Psych:  Not agitated. Does not appear depressed. Pleasant and cooperative.  Intake/Output from previous day:   Intake/Output this shift: Total I/O In: 250 [I.V.:250] Out: -   LAB RESULTS:  Recent Labs  12/13/14 1126  WBC 2.8*  HGB 14.2  HCT 40.1  PLT PLATELETS APPEAR DECREASED   BMET Lab Results  Component Value Date   NA 140 12/13/2014   NA 141 02/22/2014   NA 141 02/21/2014   K 4.1 12/13/2014   K 4.7 02/22/2014   K 3.4* 02/21/2014   CL 106 12/13/2014   CL 109 02/22/2014   CL 107 02/21/2014   CO2 22 12/13/2014   CO2 20 02/22/2014   CO2 23 02/21/2014   GLUCOSE 126* 12/13/2014   GLUCOSE 100* 02/22/2014   GLUCOSE 101* 02/21/2014   BUN 9 12/13/2014   BUN 14 02/22/2014   BUN 11 02/21/2014   CREATININE 0.80 12/13/2014   CREATININE 0.80 02/22/2014   CREATININE 0.92 02/21/2014   CALCIUM 9.1 12/13/2014   CALCIUM 8.1* 02/22/2014   CALCIUM 7.8* 02/21/2014    LFT  Recent Labs  12/13/14 1126  PROT 7.5  ALBUMIN 3.4*  AST 139*  ALT 44  ALKPHOS 106  BILITOT 1.9*   PT/INR Lab Results  Component Value Date   INR 1.20 12/13/2014   INR 1.17 02/22/2014   INR 1.20 12/14/2013   Hepatitis Panel No results for input(s): HEPBSAG, HCVAB, HEPAIGM, HEPBIGM in the last 72 hours. C-Diff No components found for: CDIFF Lipase     Component Value Date/Time   LIPASE 67* 12/14/2013 0645    Drugs of Abuse     Component Value Date/Time   LABOPIA POSITIVE* 02/20/2014 2227   COCAINSCRNUR NONE DETECTED 02/20/2014 2227   LABBENZ POSITIVE* 02/20/2014 2227   AMPHETMU NONE DETECTED 02/20/2014 2227   THCU NONE DETECTED 02/20/2014 2227   LABBARB NONE DETECTED 02/20/2014 2227     RADIOLOGY STUDIES: Dg Knee Complete 4 Views Right  12/13/2014   CLINICAL DATA:  Fall 1 week ago, right knee pain  EXAM: RIGHT KNEE - COMPLETE 4+ VIEW  COMPARISON:  None.  FINDINGS: Four views of the right knee submitted. No acute fracture or subluxation. There is diffuse osteopenia. Minimal narrowing of medial joint compartment. Mild narrowing of patellofemoral joint space. Small joint effusion.  IMPRESSION: No acute fracture or subluxation. Mild degenerate changes. Diffuse osteopenia.   Electronically Signed   By: Lahoma Crocker M.D.   On: 12/13/2014 13:16    ENDOSCOPIC STUDIES: Per HPI  IMPRESSION:   *  GI bleed.  Rule out NSAID ulcer.  R/o varicieal/portal hypertensive bleeding.   *  Alcoholism and heroin abuse.  Relapsed post detoxing in 2014 and 2015. Heroin OD in 02/2014.  Patient claims no opiate/heroin use for several months. Normally he drinks one to 2 pints of fortified wine daily but none for the last week.  *  Right knee pain post banging injury.  Using NSAIDs.    *  Thrombocytopenia.   No coagulopathy.   *  Cirrhosis.  Transaminases with ETOH type pattern. T bili slightly elevated.  Active alcohol consumption.  Hepatitis C, has never been treated.  *   Elevation Lipase, may have element of mild ETOH pancreatitis.   *  Leukopenia.     PLAN:     *  Urine tox screen  *  PPI. BID oral is sufficient.  Clears ok. Follow CBC.  *  Can eat today, ordered full liquid diet. Keep him nothing by mouth after midnight for possible EGD tomorrow at 1330.   Need Dr Hilarie Fredrickson to confirm that he is actually going to have an EGD.    Azucena Freed  12/13/2014, 2:34 PM Pager: [phone]

## 2014-12-13 NOTE — ED Notes (Signed)
Pt's heart rate at 150-- sinus tach-- ER dr notified. EKG done

## 2014-12-13 NOTE — Progress Notes (Signed)
NURSING PROGRESS NOTE  Nathan Frank [id number] Admission Data: 12/13/2014 7:37 PM Attending Provider: Modena Jansky, MD PCP:Pcp Not In System Code Status: Full  Nathan Frank is a 74 y.o. male patient admitted from ED:  -No acute distress noted.  -No complaints of shortness of breath.  -No complaints of chest pain.   Cardiac Monitoring: Box # 26 in place. Cardiac monitor yields:normal sinus rhythm, sinus tachycardia.  Blood pressure 145/88, pulse 107, temperature 98.5 F (36.9 C), temperature source Oral, resp. rate 14, height 6' (1.829 m), weight 87.2 kg (192 lb 3.9 oz), SpO2 97 %.   IV Fluids:  IV in place, occlusive dsg intact without redness, IV cath wrist right, condition patent and no redness normal saline.   Allergies:  Review of patient's allergies indicates no known allergies.  Past Medical History:   has a past medical history of Neuropathy; Hypertension; Hepatitis C; Cirrhosis (06/04/2012); Alcohol abuse; GERD (gastroesophageal reflux disease); and GI bleed due to NSAIDs (12/13/2014).  Past Surgical History:   has past surgical history that includes Esophagogastroduodenoscopy (06/07/2012) and Tonsillectomy.  Social History:   reports that he has quit smoking. His smoking use included Cigarettes. He has a 2.5 pack-year smoking history. He has never used smokeless tobacco. He reports that he drinks about 16.8 oz of alcohol per week. He reports that he uses illicit drugs (Heroin).  Skin: Intact  Patient/Family orientated to room. Information packet given to patient/family. Admission inpatient armband information verified with patient/family to include name and date of birth and placed on patient arm. Side rails up x 2, fall assessment and education completed with patient/family. Patient/family able to verbalize understanding of risk associated with falls and verbalized understanding to call for assistance before getting out of bed. Call light within reach. Patient/family able  to voice and demonstrate understanding of unit orientation instructions.    Will continue to evaluate and treat per MD orders.

## 2014-12-13 NOTE — ED Notes (Signed)
NOTIFIED DR. HARRISON FOR PATIENTS LAB RESULTS OF CG4+LACTIC ACID ,@11 :53AM, 12/13/2014.

## 2014-12-14 ENCOUNTER — Encounter (HOSPITAL_COMMUNITY)
Admission: EM | Disposition: A | Discharge status: Home / Self Care | Payer: Self-pay | Source: Home / Self Care | Attending: Emergency Medicine

## 2014-12-14 ENCOUNTER — Observation Stay (HOSPITAL_COMMUNITY): Payer: Medicare Other | Admitting: Anesthesiology

## 2014-12-14 ENCOUNTER — Encounter (HOSPITAL_COMMUNITY): Payer: Self-pay | Admitting: *Deleted

## 2014-12-14 DIAGNOSIS — K2960 Other gastritis without bleeding: Secondary | ICD-10-CM | POA: Diagnosis not present

## 2014-12-14 DIAGNOSIS — K209 Esophagitis, unspecified: Secondary | ICD-10-CM | POA: Diagnosis not present

## 2014-12-14 DIAGNOSIS — D72819 Decreased white blood cell count, unspecified: Secondary | ICD-10-CM | POA: Diagnosis not present

## 2014-12-14 DIAGNOSIS — M1711 Unilateral primary osteoarthritis, right knee: Secondary | ICD-10-CM | POA: Diagnosis not present

## 2014-12-14 DIAGNOSIS — D696 Thrombocytopenia, unspecified: Secondary | ICD-10-CM | POA: Diagnosis not present

## 2014-12-14 DIAGNOSIS — K7030 Alcoholic cirrhosis of liver without ascites: Secondary | ICD-10-CM | POA: Diagnosis not present

## 2014-12-14 DIAGNOSIS — K922 Gastrointestinal hemorrhage, unspecified: Secondary | ICD-10-CM | POA: Diagnosis not present

## 2014-12-14 DIAGNOSIS — K253 Acute gastric ulcer without hemorrhage or perforation: Secondary | ICD-10-CM

## 2014-12-14 DIAGNOSIS — K2901 Acute gastritis with bleeding: Secondary | ICD-10-CM | POA: Insufficient documentation

## 2014-12-14 DIAGNOSIS — M25561 Pain in right knee: Secondary | ICD-10-CM | POA: Diagnosis not present

## 2014-12-14 DIAGNOSIS — K921 Melena: Secondary | ICD-10-CM | POA: Diagnosis not present

## 2014-12-14 DIAGNOSIS — T39395A Adverse effect of other nonsteroidal anti-inflammatory drugs [NSAID], initial encounter: Secondary | ICD-10-CM | POA: Diagnosis not present

## 2014-12-14 HISTORY — PX: ESOPHAGOGASTRODUODENOSCOPY: SHX5428

## 2014-12-14 LAB — COMPREHENSIVE METABOLIC PANEL
ALT: 34 U/L (ref 0–53)
AST: 91 U/L — AB (ref 0–37)
Albumin: 2.9 g/dL — ABNORMAL LOW (ref 3.5–5.2)
Alkaline Phosphatase: 71 U/L (ref 39–117)
Anion gap: 9 (ref 5–15)
BUN: 7 mg/dL (ref 6–23)
CALCIUM: 8.1 mg/dL — AB (ref 8.4–10.5)
CO2: 23 mmol/L (ref 19–32)
Chloride: 107 mmol/L (ref 96–112)
Creatinine, Ser: 0.66 mg/dL (ref 0.50–1.35)
GFR calc non Af Amer: >90 mL/min (ref >90)
Glucose, Bld: 101 mg/dL — ABNORMAL HIGH (ref 70–99)
Potassium: 2.9 mmol/L — ABNORMAL LOW (ref 3.5–5.1)
SODIUM: 139 mmol/L (ref 135–145)
TOTAL PROTEIN: 6.4 g/dL (ref 6.0–8.3)
Total Bilirubin: 1.8 mg/dL — ABNORMAL HIGH (ref 0.3–1.2)

## 2014-12-14 LAB — URIC ACID: Uric Acid, Serum: 4.9 mg/dL (ref 4.0–7.8)

## 2014-12-14 LAB — CBC
HCT: 36.1 % — ABNORMAL LOW (ref 39.0–52.0)
HEMATOCRIT: 35.4 % — AB (ref 39.0–52.0)
HEMOGLOBIN: 12.4 g/dL — AB (ref 13.0–17.0)
Hemoglobin: 12.6 g/dL — ABNORMAL LOW (ref 13.0–17.0)
MCH: 34.3 pg — AB (ref 26.0–34.0)
MCH: 33.7 pg (ref 26.0–34.0)
MCHC: 35 g/dL (ref 30.0–36.0)
MCHC: 34.9 g/dL (ref 30.0–36.0)
MCV: 97.8 fL (ref 78.0–100.0)
MCV: 96.5 fL (ref 78.0–100.0)
PLATELETS: 68 10*3/uL — AB (ref 150–400)
Platelets: 55 10*3/uL — ABNORMAL LOW (ref 150–400)
RBC: 3.62 MIL/uL — AB (ref 4.22–5.81)
RBC: 3.74 MIL/uL — ABNORMAL LOW (ref 4.22–5.81)
RDW: 16.4 % — ABNORMAL HIGH (ref 11.5–15.5)
RDW: 16.3 % — AB (ref 11.5–15.5)
WBC: 1.8 10*3/uL — ABNORMAL LOW (ref 4.0–10.5)
WBC: 2.6 10*3/uL — AB (ref 4.0–10.5)

## 2014-12-14 LAB — MAGNESIUM: MAGNESIUM: 1.3 mg/dL — AB (ref 1.5–2.5)

## 2014-12-14 LAB — TSH: TSH: 5.41 u[IU]/mL — ABNORMAL HIGH (ref 0.350–4.500)

## 2014-12-14 SURGERY — EGD (ESOPHAGOGASTRODUODENOSCOPY)
Anesthesia: Monitor Anesthesia Care

## 2014-12-14 MED ORDER — SODIUM CHLORIDE 0.9 % IV SOLN: INTRAVENOUS | Status: DC

## 2014-12-14 MED ORDER — ADENOSINE 6 MG/2ML IV SOLN
INTRAVENOUS | Status: AC
  Filled 2014-12-14: qty 2

## 2014-12-14 MED ORDER — POTASSIUM CHLORIDE CRYS ER 20 MEQ PO TBCR
40.0000 meq | EXTENDED_RELEASE_TABLET | ORAL | Status: AC
  Administered 2014-12-14: 40 meq via ORAL
  Filled 2014-12-14: qty 2

## 2014-12-14 MED ORDER — SODIUM CHLORIDE 0.9 % IV BOLUS (SEPSIS)
500.0000 mL | Freq: Once | INTRAVENOUS | Status: DC
Start: 2014-12-14 — End: 2014-12-15

## 2014-12-14 MED ORDER — CHLORDIAZEPOXIDE HCL 5 MG PO CAPS
10.0000 mg | ORAL_CAPSULE | Freq: Three times a day (TID) | ORAL | Status: DC
  Administered 2014-12-14 – 2014-12-15 (×4): 10 mg via ORAL
  Filled 2014-12-14 (×4): qty 2

## 2014-12-14 MED ORDER — POTASSIUM CHLORIDE IN NACL 40-0.9 MEQ/L-% IV SOLN
INTRAVENOUS | Status: DC
  Administered 2014-12-14 – 2014-12-15 (×2): 75 mL/h via INTRAVENOUS
  Filled 2014-12-14 (×2): qty 1000

## 2014-12-14 MED ORDER — DILTIAZEM HCL 25 MG/5ML IV SOLN
10.0000 mg | Freq: Once | INTRAVENOUS | Status: AC
  Administered 2014-12-14: 10 mg via INTRAVENOUS
  Filled 2014-12-14: qty 5

## 2014-12-14 MED ORDER — LACTATED RINGERS IV SOLN
INTRAVENOUS | Status: DC | PRN
  Administered 2014-12-14: 13:00:00 via INTRAVENOUS

## 2014-12-14 MED ORDER — PROPOFOL INFUSION 10 MG/ML OPTIME
INTRAVENOUS | Status: DC | PRN
  Administered 2014-12-14: 75 ug/kg/min via INTRAVENOUS

## 2014-12-14 MED ORDER — POTASSIUM CHLORIDE 10 MEQ/100ML IV SOLN
10.0000 meq | INTRAVENOUS | Status: AC
  Administered 2014-12-14 (×4): 10 meq via INTRAVENOUS
  Filled 2014-12-14 (×4): qty 100

## 2014-12-14 MED ORDER — DILTIAZEM HCL 25 MG/5ML IV SOLN
20.0000 mg | Freq: Once | INTRAVENOUS | Status: AC
  Administered 2014-12-14: 10 mg via INTRAVENOUS
  Filled 2014-12-14: qty 5

## 2014-12-14 MED ORDER — METOPROLOL TARTRATE 1 MG/ML IV SOLN
INTRAVENOUS | Status: AC
  Filled 2014-12-14: qty 5

## 2014-12-14 MED ORDER — ADENOSINE 6 MG/2ML IV SOLN
6.0000 mg | Freq: Once | INTRAVENOUS | Status: AC
  Administered 2014-12-14: 6 mg via INTRAVENOUS

## 2014-12-14 MED ORDER — MAGNESIUM SULFATE IN D5W 10-5 MG/ML-% IV SOLN
1.0000 g | Freq: Once | INTRAVENOUS | Status: AC
  Administered 2014-12-14: 1 g via INTRAVENOUS
  Filled 2014-12-14: qty 100

## 2014-12-14 MED ORDER — POTASSIUM CHLORIDE CRYS ER 20 MEQ PO TBCR
40.0000 meq | EXTENDED_RELEASE_TABLET | ORAL | Status: DC
  Administered 2014-12-14: 40 meq via ORAL
  Filled 2014-12-14: qty 2

## 2014-12-14 MED ORDER — METOPROLOL TARTRATE 50 MG PO TABS
50.0000 mg | ORAL_TABLET | Freq: Two times a day (BID) | ORAL | Status: DC
  Administered 2014-12-14 (×2): 50 mg via ORAL
  Filled 2014-12-14 (×4): qty 1

## 2014-12-14 MED ORDER — LACTATED RINGERS IV SOLN: INTRAVENOUS | Status: DC

## 2014-12-14 NOTE — Anesthesia Preprocedure Evaluation (Addendum)
Anesthesia Evaluation  Patient identified by MRN, date of birth, ID band Patient awake    Reviewed: Allergy & Precautions, NPO status , Patient's Chart, lab work & pertinent test results  Airway Mallampati: III  TM Distance: >3 FB Neck ROM: Full    Dental   Pulmonary former smoker,  breath sounds clear to auscultation        Cardiovascular hypertension, Pt. on medications + Peripheral Vascular Disease Rhythm:Regular Rate:Normal     Neuro/Psych Depression negative neurological ROS     GI/Hepatic GERD-  ,(+) Cirrhosis -      , Hepatitis -, C  Endo/Other  negative endocrine ROS  Renal/GU negative Renal ROS     Musculoskeletal   Abdominal   Peds  Hematology  (+) anemia , Thrombocytopenia   Anesthesia Other Findings   Reproductive/Obstetrics                            Anesthesia Physical Anesthesia Plan  ASA: III  Anesthesia Plan: MAC   Post-op Pain Management:    Induction: Intravenous  Airway Management Planned: Natural Airway and Nasal Cannula  Additional Equipment:   Intra-op Plan:   Post-operative Plan:   Informed Consent: I have reviewed the patients History and Physical, chart, labs and discussed the procedure including the risks, benefits and alternatives for the proposed anesthesia with the patient or authorized representative who has indicated his/her understanding and acceptance.     Plan Discussed with: CRNA  Anesthesia Plan Comments:         Anesthesia Quick Evaluation

## 2014-12-14 NOTE — Progress Notes (Signed)
Patient Demographics  Nathan Frank, is a 74 y.o. male, DOB - 1941/03/03, ONG:[id number]  Admit date - 12/13/2014   Admitting Physician Modena Jansky, MD  Outpatient Primary MD for the patient is Pcp Not In System  LOS -    Chief Complaint  Patient presents with  . Dizziness  . Fatigue  . Rectal Bleeding        Subjective:   Reynold Mantell today has, No headache, No chest pain, No abdominal pain - No Nausea, No new weakness tingling or numbness, No Cough - SOB. Mild R Knee pain.  Assessment & Plan    1. Upper GI bleed: Reported history of black loose stools in the context of alcohol dependence and NSAID use. He was positive for occult blood, continue PPI, H&H monitor, GI consulted.   2. Alcohol dependence: Possible early withdrawal. Placed on scheduled Librium plus CIWA protocol.   3. Right knee pain, acute: Gives history of fall a few days ago. Exam not consistent with septic arthritis, question fall related injury, x-ray stable uric acid stable, supportive care, requested or throat evaluate one time likely will require knee brace along with supportive care..   4. Transient SVT: Asymptomatic. Monitor on telemetry. Table TSH, pending 2-D echo, replace low potassium. He is on low-dose oral beta blocker and monitor on telemetry.   5. History of hepatitis C: Outpatient follow-up. Currently not on treatment.   6. Leukopenia and thrombocytopenia: Likely secondary to alcohol dependence. Follow CBCs.   7. Dizziness and giddiness:? Related to problem #1. Orthostatic blood pressures. IV fluids and m continue onitor.   8. Prolonged QTC: Monitor on telemetry. Check and replace magnesium as needed. Check 2-D echocardiogram. Placed on low-dose beta blocker.   9. History of hepatitis C and  alcoholic cirrhosis: Alcohol cessation counseled. Outpatient follow-up with ID and GI regarding hepatitis C     Code Status: Full  Family Communication: none  Disposition Plan: Home   Procedures     Consults  Ortho, GI   Medications  Scheduled Meds: . chlordiazePOXIDE  10 mg Oral TID  . feeding supplement (RESOURCE BREEZE)  1 Container Oral TID BM  . folic acid  1 mg Oral Daily  . LORazepam  0-4 mg Oral Q6H   Followed by  . [START ON 12/15/2014] LORazepam  0-4 mg Oral Q12H  . magnesium sulfate 1 - 4 g bolus IVPB  1 g Intravenous Once  . multivitamin with minerals  1 tablet Oral Daily  . pantoprazole  40 mg Oral BID  . potassium chloride  10 mEq Intravenous Q1 Hr x 4  . potassium chloride  40 mEq Oral Q4H  . sodium chloride  3 mL Intravenous Q12H  . thiamine  100 mg Oral Daily   Or  . thiamine  100 mg Intravenous Daily   Continuous Infusions: . sodium chloride     PRN Meds:.acetaminophen **OR** acetaminophen, albuterol, LORazepam **OR** LORazepam, ondansetron **OR** ondansetron (ZOFRAN) IV, oxyCODONE, zolpidem  DVT Prophylaxis    SCDs    Lab Results  Component Value Date   PLT 55* 12/14/2014    Antibiotics     Anti-infectives    None          Objective:   Filed  Vitals:   12/13/14 1900 12/13/14 2233 12/13/14 2349 12/14/14 0529  BP:  158/93 155/90   Pulse:  107 130   Temp:  98.9 F (37.2 C)  98.4 F (36.9 C)  TempSrc:  Oral  Oral  Resp:  18    Height: 6' (1.829 m)     Weight: 87.2 kg (192 lb 3.9 oz)     SpO2:  94% 98% 98%    Wt Readings from Last 3 Encounters:  12/13/14 87.2 kg (192 lb 3.9 oz)  02/21/14 82.6 kg (182 lb 1.6 oz)  02/02/14 80.74 kg (178 lb)     Intake/Output Summary (Last 24 hours) at 12/14/14 1123 Last data filed at 12/14/14 1041  Gross per 24 hour  Intake 1954.17 ml  Output   1176 ml  Net 778.17 ml     Physical Exam  Awake Alert, Oriented X 3, No new F.N deficits, Normal affect [place].AT,PERRAL Supple Neck,No JVD,  No cervical lymphadenopathy appriciated.  Symmetrical Chest wall movement, Good air movement bilaterally, CTAB RRR,No Gallops,Rubs or new Murmurs, No Parasternal Heave +ve B.Sounds, Abd Soft, No tenderness, No organomegaly appriciated, No rebound - guarding or rigidity. 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  Radiology Reports Dg Knee Complete 4 Views Right  12/13/2014   CLINICAL DATA:  Fall 1 week ago, right knee pain  EXAM: RIGHT KNEE - COMPLETE 4+ VIEW  COMPARISON:  None.  FINDINGS: Four views of the right knee submitted. No acute fracture or subluxation. There is diffuse osteopenia. Minimal narrowing of medial joint compartment. Mild narrowing of patellofemoral joint space. Small joint effusion.  IMPRESSION: No acute fracture or subluxation. Mild degenerate changes. Diffuse osteopenia.   Electronically Signed   By: Lahoma Crocker M.D.   On: 12/13/2014 13:16     CBC  Recent Labs Lab 12/13/14 1126 12/13/14 1839 12/14/14 0552  WBC 2.8* 2.3* 1.8*  HGB 14.2 13.5 12.4*  HCT 40.1 37.1* 35.4*  PLT PLATELETS APPEAR DECREASED 77* 55*  MCV 97.3 96.9 97.8  MCH 34.5* 35.2* 34.3*  MCHC 35.4 36.4* 35.0  RDW 16.6* 16.5* 16.4*  LYMPHSABS 0.6*  --   --   MONOABS 0.5  --   --   EOSABS 0.0  --   --   BASOSABS 0.0  --   --     Chemistries   Recent Labs Lab 12/13/14 1126 12/13/14 1145 12/14/14 0552  NA 140  --  139  K 4.1  --  2.9*  CL 106  --  107  CO2 22  --  23  GLUCOSE 126*  --  101*  BUN 9  --  7  CREATININE 0.80  --  0.66  CALCIUM 9.1  --  8.1*  MG  --  1.3*  --   AST 139*  --  91*  ALT 44  --  34  ALKPHOS 106  --  71  BILITOT 1.9*  --  1.8*   ------------------------------------------------------------------------------------------------------------------ estimated creatinine clearance is  88.9 mL/min (by C-G formula based on Cr of 0.66). ------------------------------------------------------------------------------------------------------------------ No results for input(s): HGBA1C in the last 72 hours. ------------------------------------------------------------------------------------------------------------------ No results for input(s): CHOL, HDL, LDLCALC, TRIG, CHOLHDL, LDLDIRECT in the last 72 hours. ------------------------------------------------------------------------------------------------------------------  Recent Labs  12/14/14 0552  TSH 5.410*   ------------------------------------------------------------------------------------------------------------------ No results for  input(s): VITAMINB12, FOLATE, FERRITIN, TIBC, IRON, RETICCTPCT in the last 72 hours.  Coagulation profile  Recent Labs Lab 12/13/14 1126  INR 1.20    No results for input(s): DDIMER in the last 72 hours.  Cardiac Enzymes No results for input(s): CKMB, TROPONINI, MYOGLOBIN in the last 168 hours.  Invalid input(s): CK ------------------------------------------------------------------------------------------------------------------ Invalid input(s): POCBNP     Time Spent in minutes   35   Lala Lund K M.D on 12/14/2014 at 11:23 AM  Between 7am to 7pm - Pager - [phone]  After 7pm go to [email or web address] - password [facility]  [facility]   Office  [phone]

## 2014-12-14 NOTE — Progress Notes (Signed)
OT Cancellation Note  Patient Details Name: Nathan Frank MRN: [id number] DOB: 12-12-1940   Cancelled Treatment:    Reason Eval/Treat Not Completed: Patient at procedure or test/ unavailable  Juluis Rainier 12/14/2014, 2:00 PM

## 2014-12-14 NOTE — Progress Notes (Signed)
INITIAL NUTRITION ASSESSMENT  DOCUMENTATION CODES Per approved criteria  -Not Applicable   INTERVENTION: -RD to follow for diet advancement -Supplement diet as appropriate  NUTRITION DIAGNOSIS: Inadequate oral intake related to altered GI function as evidenced by NPO.   Goal: Pt will meet >90% of estimated nutritional needs  Monitor:  Diet advancement, PO intake, labs, weight changes, I/O's  Reason for Assessment: MST=3  74 y.o. male  Admitting Dx: GI bleed due to NSAIDs  Nathan Frank is a 74 y.o. male with history of alcohol dependence, polysubstance abuse/history of heroin OD, alcoholic and hepatitis C cirrhosis, lacks PCP, presented to ED with above complaints. He gets one week history of right knee pain  ASSESSMENT: Pt admitted with GI bleed. EKG being performed at time of visit. Nutrition-focused physical exam deferred at this time. Pt is currently NPO. Plan is for upper endoscopy later today.  Wt hx reveals wt gain trend over the past year. Pt is at risk for malnutrition given hx of polysubstance abuse and ETOH dependence.  Labs reviewed. K: 2.9 (on supplement), Calcium: 8.1, Mg: 1.3, Glucose: 101.   Height: Ht Readings from Last 1 Encounters:  12/13/14 6' (1.829 m)    Weight: Wt Readings from Last 1 Encounters:  12/13/14 192 lb 3.9 oz (87.2 kg)    Ideal Body Weight: 178#  % Ideal Body Weight: 108%  Wt Readings from Last 10 Encounters:  12/13/14 192 lb 3.9 oz (87.2 kg)  02/21/14 182 lb 1.6 oz (82.6 kg)  02/02/14 178 lb (80.74 kg)  12/15/13 168 lb 6.4 oz (76.386 kg)  12/12/13 170 lb (77.111 kg)  06/05/12 156 lb (70.761 kg)    Usual Body Weight: 156#  % Usual Body Weight: 123%  BMI:  Body mass index is 26.07 kg/(m^2). Overweight  Estimated Nutritional Needs: Kcal: 1900-2100 Protein: 90-100 grams Fluid: 1.9-2.1 L  Skin: ecchymosis  Diet Order: Diet NPO time specified Except for: Sips with Meds  EDUCATION NEEDS: -Education not appropriate  at this time   Intake/Output Summary (Last 24 hours) at 12/14/14 1149 Last data filed at 12/14/14 1041  Gross per 24 hour  Intake 1954.17 ml  Output   1176 ml  Net 778.17 ml    Last BM: 12/13/14  Labs:   Recent Labs Lab 12/13/14 1126 12/13/14 1145 12/14/14 0552  NA 140  --  139  K 4.1  --  2.9*  CL 106  --  107  CO2 22  --  23  BUN 9  --  7  CREATININE 0.80  --  0.66  CALCIUM 9.1  --  8.1*  MG  --  1.3*  --   GLUCOSE 126*  --  101*    CBG (last 3)  No results for input(s): GLUCAP in the last 72 hours.  Scheduled Meds: . chlordiazePOXIDE  10 mg Oral TID  . feeding supplement (RESOURCE BREEZE)  1 Container Oral TID BM  . folic acid  1 mg Oral Daily  . LORazepam  0-4 mg Oral Q6H   Followed by  . [START ON 12/15/2014] LORazepam  0-4 mg Oral Q12H  . magnesium sulfate 1 - 4 g bolus IVPB  1 g Intravenous Once  . metoprolol tartrate  50 mg Oral BID  . multivitamin with minerals  1 tablet Oral Daily  . pantoprazole  40 mg Oral BID  . potassium chloride  10 mEq Intravenous Q1 Hr x 4  . potassium chloride  40 mEq Oral Q4H  . sodium chloride  3 mL Intravenous Q12H  . thiamine  100 mg Oral Daily   Or  . thiamine  100 mg Intravenous Daily    Continuous Infusions: . sodium chloride 1,000 mL (12/14/14 1124)    Past Medical History  Diagnosis Date  . Neuropathy   . Hypertension   . Hepatitis C   . 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 Alcohol abuse   . GERD (gastroesophageal reflux disease)   . GI bleed due to NSAIDs 12/13/2014    Past Surgical History  Procedure Laterality Date  . Esophagogastroduodenoscopy  06/07/2012    Procedure: ESOPHAGOGASTRODUODENOSCOPY (EGD);  Surgeon: Milus Banister, MD;  Location: [place];  Service: Endoscopy;  Laterality: N/A;  may need treatment of varices  . Tonsillectomy      Nathan Frank, RD, LDN, CDE Pager: [phone] After hours Pager: [phone]

## 2014-12-14 NOTE — Consult Note (Signed)
ORTHOPAEDIC CONSULTATION  REQUESTING PHYSICIAN: Thurnell Lose, MD  Chief Complaint: right knee pain  HPI: Nathan Frank is a 74 y.o. male who complains of right knee pain since admission.  Denies f/c or constitutional sxs.  Patient denies recent illnesses, trauma, infections.  Ortho consulted for right knee pain.  Past Medical History  Diagnosis Date  . Neuropathy   . Hypertension   . Hepatitis C   . 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 Alcohol abuse   . GERD (gastroesophageal reflux disease)   . GI bleed due to NSAIDs 12/13/2014   Past Surgical History  Procedure Laterality Date  . Esophagogastroduodenoscopy  06/07/2012    Procedure: ESOPHAGOGASTRODUODENOSCOPY (EGD);  Surgeon: Milus Banister, MD;  Location: [place];  Service: Endoscopy;  Laterality: N/A;  may need treatment of varices  . Tonsillectomy     History   Social History  . Marital Status: Married    Spouse Name: N/A  . Number of Children: N/A  . Years of Education: N/A   Social History Main Topics  . Smoking status: Former Smoker -- 0.50 packs/day for 5 years    Types: Cigarettes  . Smokeless tobacco: Never Used     Comment: "quit smoking cigarettes in the 1970's"  . Alcohol Use: 16.8 oz/week    28 Glasses of wine per week     Comment: 12/13/2014 "drank 1 pint of wine/day til I stopped last Saturday"  . Drug Use: Yes    Special: Heroin     Comment: 12/13/2014 "been clean for over a year"  . Sexual Activity: Not Currently    Birth Control/ Protection: Condom   Other Topics Concern  . None   Social History Narrative   Family History  Problem Relation Age of Onset  . Stroke Father   . Prostate cancer Brother    No Known Allergies Prior to Admission medications   Medication Sig Start Date End Date Taking? Authorizing Provider  chlordiazePOXIDE (LIBRIUM) 5 MG capsule Take 5 mg by mouth daily as needed for anxiety.  09/20/14  Yes Historical Provider, MD  ibuprofen (ADVIL,MOTRIN) 200 MG tablet Take 200 mg by  mouth every 6 (six) hours as needed for mild pain or moderate pain.   Yes Historical Provider, MD  Multiple Vitamin (ONE-A-DAY MENS PO) Take 1 tablet by mouth daily.   Yes Historical Provider, MD  naproxen sodium (ANAPROX) 220 MG tablet Take 440 mg by mouth daily as needed (pain).   Yes Historical Provider, MD  omeprazole (PRILOSEC) 20 MG capsule Take 1 capsule (20 mg total) by mouth daily. For acid reflux Patient taking differently: Take 20 mg by mouth daily as needed (acid reflux). For acid reflux 02/05/14  Yes Encarnacion Slates, NP  traZODone (DESYREL) 100 MG tablet Take 1 tablet (100 mg total) by mouth at bedtime as needed for sleep. 02/05/14  Yes Encarnacion Slates, NP   Dg Knee Complete 4 Views Right  12/13/2014   CLINICAL DATA:  Fall 1 week ago, right knee pain  EXAM: RIGHT KNEE - COMPLETE 4+ VIEW  COMPARISON:  None.  FINDINGS: Four views of the right knee submitted. No acute fracture or subluxation. There is diffuse osteopenia. Minimal narrowing of medial joint compartment. Mild narrowing of patellofemoral joint space. Small joint effusion.  IMPRESSION: No acute fracture or subluxation. Mild degenerate changes. Diffuse osteopenia.   Electronically Signed   By: Lahoma Crocker M.D.   On: 12/13/2014 13:16    Positive ROS: All other systems have been  reviewed and were otherwise negative with the exception of those mentioned in the HPI and as above.  Physical Exam: General: Alert, no acute distress Cardiovascular: No pedal edema Respiratory: No cyanosis, no use of accessory musculature GI: No organomegaly, abdomen is soft and non-tender Skin: No lesions in the area of chief complaint Neurologic: Sensation intact distally Psychiatric: Patient is competent for consent with normal mood and affect Lymphatic: No axillary or cervical lymphadenopathy  MUSCULOSKELETAL:  - mild pain with full ROM of knee - trace effusion - no warmth, erythema, cellulitis, open lesions  Assessment: Right knee OA flare  up  Plan: - recommend outpt follow up for steroid injection - WBAT - activity as tolerated - avoid NSAIDs 2/2 GI bleed - f/u in office in 1-2 weeks  Thank you for the consult and the opportunity to see Nathan Frank Eduard Roux, MD [place] 3:22 PM

## 2014-12-14 NOTE — Op Note (Signed)
[facility] [place], [address]   ENDOSCOPY PROCEDURE REPORT  PATIENT: Nathan Frank, Nathan Frank  MR#: [id number] BIRTHDATE: [birth year] , 2  yrs. old GENDER: male ENDOSCOPIST: Jerene Bears, MD REFERRED BY:  [facility] PROCEDURE DATE:  12/14/2014 PROCEDURE:  EGD w/ biopsy ASA CLASS:     Class III INDICATIONS:  melena. MEDICATIONS: Monitored anesthesia care and Per Anesthesia TOPICAL ANESTHETIC: none  DESCRIPTION OF PROCEDURE: After the risks benefits and alternatives of the procedure were thoroughly explained, informed consent was obtained.  The Pentax Gastroscope [id number] endoscope was introduced through the mouth and advanced to the second portion of the duodenum , Without limitations.  The instrument was slowly withdrawn as the mucosa was fully examined.  ESOPHAGUS: The mucosa of the esophagus appeared normal.   No evidence of varices.  STOMACH: Ulcerated and severe erosive gastritis (inflammation) was found in the gastric antrum and at the incisura.  Multiple biopsies were performed from gastric body, antrum and incisura using cold forceps.  DUODENUM: The duodenal mucosa showed no abnormalities in the bulb and 2nd part of the duodenum.  Retroflexed views revealed no abnormalities.     The scope was then withdrawn from the patient and the procedure completed.  COMPLICATIONS: There were no immediate complications.  ENDOSCOPIC IMPRESSION: 1.   The mucosa of the esophagus appeared normal 2.   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  This is felt to explain melena. 3.   The duodenal mucosa showed no abnormalities in the bulb and 2nd part of the duodenum  RECOMMENDATIONS: 1.  Await biopsy results 2.  Follow-up of helicobacter pylori status, treat if indicated 3.  BID PPI for at least 8 weeks 4.  No NSAIDs or alcohol 5.  Monitor Hgb, replace  iron as needed  eSigned:  Jerene Bears, MD 12/14/2014 2:01 PM  CC: the patient

## 2014-12-14 NOTE — Progress Notes (Signed)
CRITICAL VALUE ALERT  Critical value received:  K=2.9   Date of notification:  12/14/2014  Time of notification:  2957  Critical value read back:Yes.    Nurse who received alert:  Alphonsus Sias  MD notified (1st page):  Dr. Candiss Norse  Time of first page:  [phone]  MD notified (2nd page):  Time of second page:  Responding MD: Dr. Candiss Norse  Time MD responded:  [phone]

## 2014-12-14 NOTE — Progress Notes (Signed)
UR completed 

## 2014-12-14 NOTE — Progress Notes (Signed)
Patient went in to a tachy rhythm in the 150s.  Patient was instructed in vagal maneuvers without results.  Anesthesiologist was notified.  EKG was ordered and MD arrived.  Patient's primary MD arrived and adenosine was given with no result.  Cardizem was then order and given once with no result.  Additional dose of cardizem was given and patient gradually dropped his rate back to a sinus rhythm in the 90s.  Patient was monitored and then transported on telemetry to his room and his nurse was given report.

## 2014-12-14 NOTE — Progress Notes (Signed)
EKG CRITICAL VALUE     12 lead EKG performed.  Critical value noted DONNA, PICKERING, RN notified.   Yehuda Mao, [place] 12/14/2014 2:51 PM

## 2014-12-14 NOTE — Transfer of Care (Signed)
Immediate Anesthesia Transfer of Care Note  Patient: Nathan Frank  Procedure(s) Performed: Procedure(s): ESOPHAGOGASTRODUODENOSCOPY (EGD) (N/A)  Patient Location: PACU  Anesthesia Type:MAC  Level of Consciousness: awake and alert   Airway & Oxygen Therapy: Patient Spontanous Breathing  Post-op Assessment: Report given to RN  Post vital signs: Reviewed  Last Vitals:  Filed Vitals:   12/14/14 1400  BP: 132/96  Pulse: 96  Temp: 36.9 C  Resp: 19    Complications: No apparent anesthesia complications

## 2014-12-14 NOTE — Progress Notes (Signed)
  Echocardiogram 2D Echocardiogram has been performed.  Nathan Frank 12/14/2014, 12:06 PM

## 2014-12-15 ENCOUNTER — Encounter (HOSPITAL_COMMUNITY): Payer: Self-pay | Admitting: Cardiology

## 2014-12-15 DIAGNOSIS — F1020 Alcohol dependence, uncomplicated: Secondary | ICD-10-CM | POA: Diagnosis not present

## 2014-12-15 DIAGNOSIS — I471 Supraventricular tachycardia: Secondary | ICD-10-CM

## 2014-12-15 DIAGNOSIS — T39395A Adverse effect of other nonsteroidal anti-inflammatory drugs [NSAID], initial encounter: Secondary | ICD-10-CM | POA: Diagnosis not present

## 2014-12-15 DIAGNOSIS — K922 Gastrointestinal hemorrhage, unspecified: Secondary | ICD-10-CM | POA: Diagnosis not present

## 2014-12-15 LAB — CBC
HEMATOCRIT: 34.1 % — AB (ref 39.0–52.0)
Hemoglobin: 12 g/dL — ABNORMAL LOW (ref 13.0–17.0)
MCH: 34.2 pg — ABNORMAL HIGH (ref 26.0–34.0)
MCHC: 35.2 g/dL (ref 30.0–36.0)
MCV: 97.2 fL (ref 78.0–100.0)
Platelets: 55 10*3/uL — ABNORMAL LOW (ref 150–400)
RBC: 3.51 MIL/uL — ABNORMAL LOW (ref 4.22–5.81)
RDW: 16.7 % — AB (ref 11.5–15.5)
WBC: 3.8 10*3/uL — ABNORMAL LOW (ref 4.0–10.5)

## 2014-12-15 LAB — BASIC METABOLIC PANEL
Anion gap: 6 (ref 5–15)
BUN: 10 mg/dL (ref 6–23)
CO2: 22 mmol/L (ref 19–32)
Calcium: 8 mg/dL — ABNORMAL LOW (ref 8.4–10.5)
Chloride: 112 mmol/L (ref 96–112)
Creatinine, Ser: 0.71 mg/dL (ref 0.50–1.35)
GFR calc Af Amer: >90 mL/min (ref >90)
Glucose, Bld: 104 mg/dL — ABNORMAL HIGH (ref 70–99)
Potassium: 4.3 mmol/L (ref 3.5–5.1)
SODIUM: 140 mmol/L (ref 135–145)

## 2014-12-15 LAB — DRUGS OF ABUSE SCREEN W/O ALC, ROUTINE URINE
Amphetamine Screen, Ur: NEGATIVE
Barbiturate Quant, Ur: NEGATIVE
Benzodiazepines.: POSITIVE — AB
CREATININE, U: 75.6 mg/dL
Cocaine Metabolites: NEGATIVE
Marijuana Metabolite: NEGATIVE
Methadone: NEGATIVE
Opiate Screen, Urine: NEGATIVE
Phencyclidine (PCP): NEGATIVE
Propoxyphene: NEGATIVE

## 2014-12-15 LAB — MAGNESIUM: Magnesium: 1.3 mg/dL — ABNORMAL LOW (ref 1.5–2.5)

## 2014-12-15 MED ORDER — THIAMINE HCL 100 MG PO TABS: 100.0000 mg | ORAL_TABLET | Freq: Every day | ORAL | Status: DC

## 2014-12-15 MED ORDER — OMEPRAZOLE 40 MG PO CPDR: 40.0000 mg | DELAYED_RELEASE_CAPSULE | Freq: Every day | ORAL | Status: DC

## 2014-12-15 MED ORDER — MAGNESIUM SULFATE 2 GM/50ML IV SOLN
2.0000 g | Freq: Once | INTRAVENOUS | Status: AC
  Administered 2014-12-15: 2 g via INTRAVENOUS
  Filled 2014-12-15: qty 50

## 2014-12-15 MED ORDER — METOPROLOL TARTRATE 25 MG PO TABS: 75.0000 mg | ORAL_TABLET | Freq: Two times a day (BID) | ORAL | Status: DC

## 2014-12-15 MED ORDER — FOLIC ACID 1 MG PO TABS: 1.0000 mg | ORAL_TABLET | Freq: Every day | ORAL | Status: DC

## 2014-12-15 MED ORDER — METOPROLOL TARTRATE 25 MG PO TABS
75.0000 mg | ORAL_TABLET | Freq: Two times a day (BID) | ORAL | Status: DC
  Administered 2014-12-15: 75 mg via ORAL
  Filled 2014-12-15 (×2): qty 1

## 2014-12-15 NOTE — Consult Note (Signed)
Consulting cardiologist: Dr. Satira Sark  Reason for consultation: PSVT  Clinical Summary [name] is a 74 y.o.male with past medical history outlined below, currently admitted to the hospital with an upper GI bleed in the context of alcohol abuse and regular NSAID use. During hospitalization he has had episodic SVT (wide complex with right bundle branch block at baseline). Episode reported yesterday, chart reviewed, although not able to review all telemetry strips. Heart rate was in the 150s, reportedly did not respond to vagal maneuvers or adenosine, however ultimately slowed with Cardizem. He states that he thinks he may have had an episode like this in the past, although does not describe frequent sense of palpitations or chest pain. Echocardiogram outlined below reveals LVEF approximately 50%. He has been placed on Lopressor. Currently in sinus rhythm today.  At baseline ECG shows normal sinus rhythm with right bundle branch block. Episode of tachycardia does not look to be 2:1 atrial flutter necessarily, more likely an atrial tachycardia or reentrant mechanism.  No Known Allergies  Medications Scheduled Medications: . chlordiazePOXIDE  10 mg Oral TID  . feeding supplement (RESOURCE BREEZE)  1 Container Oral TID BM  . folic acid  1 mg Oral Daily  . LORazepam  0-4 mg Oral Q6H   Followed by  . LORazepam  0-4 mg Oral Q12H  . magnesium sulfate 1 - 4 g bolus IVPB  2 g Intravenous Once  . metoprolol tartrate  50 mg Oral BID  . multivitamin with minerals  1 tablet Oral Daily  . pantoprazole  40 mg Oral BID  . sodium chloride  500 mL Intravenous Once  . sodium chloride  3 mL Intravenous Q12H  . thiamine  100 mg Oral Daily   Or  . thiamine  100 mg Intravenous Daily    Infusions: . lactated ringers      PRN Medications: acetaminophen **OR** acetaminophen, albuterol, LORazepam **OR** LORazepam, ondansetron **OR** ondansetron (ZOFRAN) IV, oxyCODONE, zolpidem   Past  Medical History  Diagnosis Date  . Neuropathy   . Essential hypertension   . Hepatitis C   . 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 Alcohol abuse   . GERD (gastroesophageal reflux disease)   . GI bleed due to NSAIDs 12/13/2014    Past Surgical History  Procedure Laterality Date  . Esophagogastroduodenoscopy  06/07/2012    Procedure: ESOPHAGOGASTRODUODENOSCOPY (EGD);  Surgeon: Milus Banister, MD;  Location: [facility];  Service: Endoscopy;  Laterality: N/A;  may need treatment of varices  . Tonsillectomy      Family History  Problem Relation Age of Onset  . Stroke Father   . Prostate cancer Brother     Social History [name] reports that he has quit smoking. His smoking use included Cigarettes. He has a 2.5 pack-year smoking history. He has never used smokeless tobacco. [name] reports that he drinks about 16.8 oz of alcohol per week.  Review of Systems Complete review of systems negative except as otherwise outlined in the clinical summary and also the following. Recently problems with fatigue and dizziness in association with GI bleed. Notes chronic arthritic pains, right knee pain most recently.  Physical Examination Blood pressure 133/79, pulse 96, temperature 98.4 F (36.9 C), temperature source Oral, resp. rate 20, height 6' (1.829 m), weight 192 lb 3.9 oz (87.2 kg), SpO2 96 %.  Intake/Output Summary (Last 24 hours) at 12/15/14 0941 Last data filed at 12/15/14 0653  Gross per 24 hour  Intake 3246.25 ml  Output  1050 ml  Net 2196.25 ml   Telemetry: Currently normal sinus rhythm.  Chronically ill-appearing male, appears comfortable at rest. HEENT: Conjunctiva and lids normal, oropharynx clear with poor dentition. Neck: Supple, no elevated JVP or carotid bruits, no thyromegaly. Lungs: Clear to auscultation, nonlabored breathing at rest. Cardiac: Regular rate and rhythm with occasional ectopic beat, no S3 or significant systolic murmur, no pericardial rub. Abdomen: Soft,  nontender, bowel sounds present, no guarding or rebound. Extremities: Trace ankle edema, distal pulses 2+. Skin: Warm and dry. Musculoskeletal: No kyphosis. Neuropsychiatric: Alert and oriented x3, affect grossly appropriate.   Lab Results  Basic Metabolic Panel:  Recent Labs Lab 12/13/14 1126 12/13/14 1145 12/14/14 0552 12/15/14 0528  NA 140  --  139 140  K 4.1  --  2.9* 4.3  CL 106  --  107 112  CO2 22  --  23 22  GLUCOSE 126*  --  101* 104*  BUN 9  --  7 10  CREATININE 0.80  --  0.66 0.71  CALCIUM 9.1  --  8.1* 8.0*  MG  --  1.3*  --  1.3*    Liver Function Tests:  Recent Labs Lab 12/13/14 1126 12/14/14 0552  AST 139* 91*  ALT 44 34  ALKPHOS 106 71  BILITOT 1.9* 1.8*  PROT 7.5 6.4  ALBUMIN 3.4* 2.9*    CBC:  Recent Labs Lab 12/13/14 1126 12/13/14 1839 12/14/14 0552 12/14/14 1834 12/15/14 0528  WBC 2.8* 2.3* 1.8* 2.6* 3.8*  NEUTROABS 1.7  --   --   --   --   HGB 14.2 13.5 12.4* 12.6* 12.0*  HCT 40.1 37.1* 35.4* 36.1* 34.1*  MCV 97.3 96.9 97.8 96.5 97.2  PLT PLATELETS APPEAR DECREASED 77* 55* 68* 55*    Imaging  Echocardiogram 12/14/2014: Study Conclusions  - Left ventricle: The cavity size was normal. Wall thickness was normal. The estimated ejection fraction was 50%. Diffuse hypokinesis. Features are consistent with a pseudonormal left ventricular filling pattern, with concomitant abnormal relaxation and increased filling pressure (grade 2 diastolic dysfunction). - Aortic valve: There was no stenosis. - Mitral valve: There was trivial regurgitation. - Left atrium: The atrium was mildly to moderately dilated. - Right ventricle: The cavity size was normal. Systolic function was normal. - Tricuspid valve: Peak RV-RA gradient (S): 32 mm Hg. - Pulmonary arteries: PA peak pressure: 35 mm Hg (S). - Inferior vena cava: The vessel was normal in size. The respirophasic diameter changes were in the normal range (>= 50%), consistent  with normal central venous pressure.  Impressions:  - Technically difficult study with poor acoustic windows. Normal LV size with EF 50%, diffuse mild hypokinesis. Moderate diastolic dysfunction. Normal RV size and systolic function. No significant valvular abnormalities.   Impression  1. 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 No clear flutter waves to suspect 2:1 atrial flutter although heart rate in the 150s would be consistent. Currently in sinus rhythm. He is been placed on Lopressor. LVEF 50% by echocardiogram without major valvular abnormalities.  2. Upper GI bleed, gastroenterology evaluating, status post endoscopy. He did have severe ulcerative gastritis noted. Has had regular NSAID use.  3. 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   Recommendations  Agree with current treatment, continue Lopressor although increase to 75 mg twice daily. Follow on telemetry.  Satira Sark, M.D., F.A.C.C.

## 2014-12-15 NOTE — Progress Notes (Signed)
Utilization Review completed.  

## 2014-12-15 NOTE — Discharge Instructions (Signed)

## 2014-12-15 NOTE — Discharge Summary (Signed)
Nathan Frank, is a 74 y.o. male  DOB [birth year]  MRN [id number].  Admission date:  12/13/2014  Admitting Physician  Modena Jansky, MD  Discharge Date:  12/15/2014   Primary MD  Pcp Not In System  Recommendations for primary care physician for things to follow:   Check TSH, CBC and CMP in a week. Needs close outpatient follow-up with cardiology-GI and orthopedic.   Admission Diagnosis  Dehydration [E86.0] Knee pain [M25.569] GI bleed due to NSAIDs [K92.2, T39.395A]   Discharge Diagnosis  Dehydration [E86.0] Knee pain [M25.569] GI bleed due to NSAIDs [K92.2, T39.395A]    Principal Problem:   GI bleed due to NSAIDs Active Problems:   Alcohol abuse   Hepatitis C   Cirrhosis   Gastritis   Alcohol dependence   Right knee pain   Dizziness and giddiness   Upper GI bleeding   Acute gastric ulcer   Acute gastritis with hemorrhage      Past Medical History  Diagnosis Date  . Neuropathy   . Essential hypertension   . Hepatitis C   . 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 Alcohol abuse   . GERD (gastroesophageal reflux disease)   . GI bleed due to NSAIDs 12/13/2014    Past Surgical History  Procedure Laterality Date  . Esophagogastroduodenoscopy  06/07/2012    Procedure: ESOPHAGOGASTRODUODENOSCOPY (EGD);  Surgeon: Milus Banister, MD;  Location: [facility];  Service: Endoscopy;  Laterality: N/A;  may need treatment of varices  . Tonsillectomy         History of present illness and  Hospital Course:     Kindly see H&P for history of present illness and admission details, please review complete Labs, Consult reports and Test reports for all details in brief  HPI  from the history and physical done on the day of admission  Nathan Frank is a 74 y.o. male with history of alcohol dependence, polysubstance  abuse/history of heroin OD, alcoholic and hepatitis C cirrhosis, lacks PCP, presented to ED with above complaints. He gets one week history of right knee pain. He indicates that he might have fallen while under the influence of alcohol but cannot be sure if he sustained injury to the right knee. Pain is intermittent, severe, nonradiating, worse with weightbearing and movements, mild swelling but no redness, fever or chills. Patient has been using liberal ibuprofen/Naprosyn for pain with some relief. He also gives 4 days history of dark loose stools up to 4-5 times daily but no bright red blood. He states that this has started to clear up in the ED. Denies nausea, vomiting or abdominal pain. Appetite has decreased. He also complains of feeling dizzy and lightheaded but denies passing out. He is unable to sleep at night because "I think I'm having withdrawal". In the ED, a rectal exam by ED PA showed brown stools and FOBT +. Hemoglobin 14.2 and WBC 2.8. As per ED PA, patient transiently was in SVT 150s which was asymptomatic and he reverted spontaneously to sinus tachycardia in the 110s.  Hospitalist admission requested.    Hospital Course      1. Upper GI bleed: Reported history of black loose stools in the context of alcohol dependence and NSAID use. He was positive for occult blood, seen by GI underwent EGD showing gastritis, he be placed on PPI requested to stop taking NSAIDs, H&H stable did not require transfusion will follow with GI physician Dr. Elmo Putt.   2. Alcohol dependence: A to quit remained stable under CIWA protocol, she will be discharged on home dose Librium.   3. Right knee pain, acute: Gives history of fall a few days ago. Exam not consistent with septic arthritis, question fall related injury, x-ray stable uric acid stable, supportive care, was seen by orthopedic surgeon Dr. Erlinda Hong supportive care, will follow with him in the office for a steroid injection likely has flareup of  osteoarthritis.   4. Transient SVT: Asymptomatic. Seen by cardiology, TSH was slightly high, echogram stable with EF 50%, placed on beta blocker, to follow with cardiology in the office.   5. History of hepatitis C: Outpatient follow-up. With ID and GI recommended.   6. Leukopenia and thrombocytopenia: Likely secondary to alcohol dependence. Could have underlying cirrhosis will follow with GI outpatient.   7. Dizziness and giddiness: Due to dehydration resolved with hydration.   8. Prolonged QTC: 2 g of IV magnesium given, placed on Lopressor, EF over 50%, no symptoms.      Discharge Condition: Stable   Follow UP  Follow-up Information    Follow up with Marianna Payment, MD In 1 week.   Specialty:  Orthopedic Surgery   Why:  As needed   Contact information:   [place] [address] [phone]       Follow up with [facility]. Schedule an appointment as soon as possible for a visit in 1 week.   Why:  SVT   Contact information:   [place] [phone]       Follow up with Jerene Bears, MD. Schedule an appointment as soon as possible for a visit in 1 week.   Specialty:  Gastroenterology   Why:  GI Bleed   Contact information:   [address] [place] [address] [phone]       Follow up with [facility]    . Schedule an appointment as soon as possible for a visit in 1 week.   Contact information:   [address] [place] [address] [phone]      Follow up with [facility]             . Schedule an appointment as soon as possible for a visit in 1 week.   Why:  Hep C   Contact information:   [address] [place] [address] [place] [address]         Discharge Instructions  and  Discharge Medications          Discharge Instructions    Diet - low sodium heart  healthy    Complete by:  As directed      Discharge instructions    Complete by:  As directed   Follow with Primary MD  in 7 days   Get CBC, CMP, 2 view Chest X ray checked  by Primary MD next visit.    Activity: As tolerated with Full fall precautions use walker/cane & assistance as  needed   Disposition Home     Diet: Heart Healthy    For Heart failure patients - Check your Weight same time everyday, if you gain over 2 pounds, or you develop in leg swelling, experience more shortness of breath or chest pain, call your Primary MD immediately. Follow Cardiac Low Salt Diet and 1.5 lit/day fluid restriction.   On your next visit with your primary care physician please Get Medicines reviewed and adjusted.   Please request your Prim.MD to go over all Hospital Tests and Procedure/Radiological results at the follow up, please get all Hospital records sent to your Prim MD by signing hospital release before you go home.   If you experience worsening of your admission symptoms, develop shortness of breath, life threatening emergency, suicidal or homicidal thoughts you must seek medical attention immediately by calling 911 or calling your MD immediately  if symptoms less severe.  You Must read complete instructions/literature along with all the possible adverse reactions/side effects for all the Medicines you take and that have been prescribed to you. Take any new Medicines after you have completely understood and accpet all the possible adverse reactions/side effects.   Do not drive, operating heavy machinery, perform activities at heights, swimming or participation in water activities or provide baby sitting services if your were admitted for syncope or siezures until you have seen by Primary MD or a Neurologist and advised to do so again.  Do not drive when taking Pain medications.    Do not take more than prescribed Pain, Sleep and Anxiety Medications  Special Instructions: If you have  smoked or chewed Tobacco  in the last 2 yrs please stop smoking, stop any regular Alcohol  and or any Recreational drug use.  Wear Seat belts while driving.   Please note  You were cared for by a hospitalist during your hospital stay. If you have any questions about your discharge medications or the care you received while you were in the hospital after you are discharged, you can call the unit and asked to speak with the hospitalist on call if the hospitalist that took care of you is not available. Once you are discharged, your primary care physician will handle any further medical issues. Please note that NO REFILLS for any discharge medications will be authorized once you are discharged, as it is imperative that you return to your primary care physician (or establish a relationship with a primary care physician if you do not have one) for your aftercare needs so that they can reassess your need for medications and monitor your lab values.     Increase activity slowly    Complete by:  As directed             Medication List    STOP taking these medications        ibuprofen 200 MG tablet  Commonly known as:  ADVIL,MOTRIN     naproxen sodium 220 MG tablet  Commonly known as:  ANAPROX      TAKE these medications        chlordiazePOXIDE 5 MG capsule  Commonly known as:  LIBRIUM  Take 5 mg by mouth daily as needed for anxiety.     folic acid 1 MG tablet  Commonly known as:  FOLVITE  Take 1 tablet (1 mg total) by mouth daily.     metoprolol tartrate 25 MG tablet  Commonly known as:  LOPRESSOR  Take 3 tablets (75 mg total) by mouth 2 (two)  times daily.     omeprazole 40 MG capsule  Commonly known as:  PRILOSEC  Take 1 capsule (40 mg total) by mouth daily. For acid reflux     ONE-A-DAY MENS PO  Take 1 tablet by mouth daily.     thiamine 100 MG tablet  Take 1 tablet (100 mg total) by mouth daily.     traZODone 100 MG tablet  Commonly known as:  DESYREL  Take 1 tablet (100  mg total) by mouth at bedtime as needed for sleep.          Diet and Activity recommendation: See Discharge Instructions above   Consults obtained - Cards, GI, Ortho   Major procedures and Radiology Reports - PLEASE review detailed and final reports for all details, in brief -    TTE  Technically difficult study with poor acoustic windows. Normal LV size with EF 50%, diffuse mild hypokinesis. Moderate diastolic dysfunction. Normal RV size and systolic function. No significant valvular abnormalities.   Dg Knee Complete 4 Views Right  12/13/2014   CLINICAL DATA:  Fall 1 week ago, right knee pain  EXAM: RIGHT KNEE - COMPLETE 4+ VIEW  COMPARISON:  None.  FINDINGS: Four views of the right knee submitted. No acute fracture or subluxation. There is diffuse osteopenia. Minimal narrowing of medial joint compartment. Mild narrowing of patellofemoral joint space. Small joint effusion.  IMPRESSION: No acute fracture or subluxation. Mild degenerate changes. Diffuse osteopenia.   Electronically Signed   By: Lahoma Crocker M.D.   On: 12/13/2014 13:16    Micro Results      No results found for this or any previous visit (from the past 240 hour(s)).     Today   Subjective:   Nathan Frank today has no headache,no chest abdominal pain,no new weakness tingling or numbness, feels much better wants to go home today. Improved knee pain.  Objective:   Blood pressure 133/79, pulse 96, temperature 98.4 F (36.9 C), temperature source Oral, resp. rate 20, height 6' (1.829 m), weight 87.2 kg (192 lb 3.9 oz), SpO2 96 %.   Intake/Output Summary (Last 24 hours) at 12/15/14 1014 Last data filed at 12/15/14 0653  Gross per 24 hour  Intake 3246.25 ml  Output   1050 ml  Net 2196.25 ml    Exam Awake Alert, Oriented x 3, No new F.N deficits, Normal affect [place].AT,PERRAL Supple Neck,No JVD, No cervical lymphadenopathy appriciated.  Symmetrical Chest wall movement, Good air movement  bilaterally, CTAB RRR,No Gallops,Rubs or new Murmurs, No Parasternal Heave +ve B.Sounds, Abd Soft, Non tender, No organomegaly appriciated, No rebound -guarding or rigidity. No Cyanosis, Clubbing or edema, No new Rash or bruise  Data Review   CBC w Diff:  Lab Results  Component Value Date   WBC 3.8* 12/15/2014   HGB 12.0* 12/15/2014   HCT 34.1* 12/15/2014   PLT 55* 12/15/2014   LYMPHOPCT 21 12/13/2014   MONOPCT 18* 12/13/2014   EOSPCT 0 12/13/2014   BASOPCT 1 12/13/2014    CMP:  Lab Results  Component Value Date   NA 140 12/15/2014   K 4.3 12/15/2014   CL 112 12/15/2014   CO2 22 12/15/2014   BUN 10 12/15/2014   CREATININE 0.71 12/15/2014   PROT 6.4 12/14/2014   ALBUMIN 2.9* 12/14/2014   BILITOT 1.8* 12/14/2014   ALKPHOS 71 12/14/2014   AST 91* 12/14/2014   ALT 34 12/14/2014  .   Total Time in preparing paper work, data evaluation and todays exam -  35 minutes  Thurnell Lose M.D on 12/15/2014 at 10:14 AM  [facility]   Office  [phone]

## 2014-12-17 ENCOUNTER — Encounter (HOSPITAL_COMMUNITY): Payer: Self-pay | Admitting: Internal Medicine

## 2014-12-17 NOTE — Anesthesia Postprocedure Evaluation (Signed)
  Anesthesia Post-op Note  Patient: Nathan Frank  Procedure(s) Performed: Procedure(s): ESOPHAGOGASTRODUODENOSCOPY (EGD) (N/A)  Patient Location: PACU  Anesthesia Type:MAC  Level of Consciousness: awake and alert   Airway and Oxygen Therapy: Patient Spontanous Breathing  Post-op Pain: none  Post-op Assessment: Patient's Cardiovascular Status Stable  Post-op Vital Signs: Reviewed  Last Vitals:  Filed Vitals:   12/15/14 0616  BP:   Pulse:   Temp: 36.9 C  Resp: 20    Complications: No apparent anesthesia complications

## 2014-12-21 LAB — BENZODIAZEPINE, QUANTITATIVE, URINE
Alprazolam metabolite (GC/LC/MS), ur confirm: NEGATIVE ng/mL (ref <25)
CLONAZEPAU: NEGATIVE ng/mL (ref <25)
FLURAZEPAM GC/MS CONF: NEGATIVE ng/mL (ref <50)
Lorazepam (GC/LC/MS), ur confirm: 87 ng/mL — ABNORMAL HIGH (ref <50)
Midazolam (GC/LC/MS), ur confirm: NEGATIVE ng/mL (ref <50)
Nordiazepam GC/MS Conf: NEGATIVE ng/mL (ref <50)
Oxazepam GC/MS Conf: NEGATIVE ng/mL (ref <50)
TEMAZEPAM GC/MS CONF: NEGATIVE ng/mL (ref <50)
Triazolam metabolite (GC/LC/MS), ur confirm: NEGATIVE ng/mL (ref <50)

## 2015-01-31 ENCOUNTER — Encounter: Payer: Self-pay | Admitting: *Deleted

## 2015-02-15 ENCOUNTER — Emergency Department (HOSPITAL_COMMUNITY)
Admission: EM | Admit: 2015-02-15 | Discharge: 2015-02-15 | Disposition: A | Discharge status: Home / Self Care | Payer: Medicare Other | Attending: Emergency Medicine | Admitting: Emergency Medicine

## 2015-02-15 ENCOUNTER — Encounter (HOSPITAL_COMMUNITY): Payer: Self-pay | Admitting: Emergency Medicine

## 2015-02-15 DIAGNOSIS — F1010 Alcohol abuse, uncomplicated: Secondary | ICD-10-CM | POA: Diagnosis not present

## 2015-02-15 DIAGNOSIS — K219 Gastro-esophageal reflux disease without esophagitis: Secondary | ICD-10-CM | POA: Insufficient documentation

## 2015-02-15 DIAGNOSIS — Z8669 Personal history of other diseases of the nervous system and sense organs: Secondary | ICD-10-CM | POA: Insufficient documentation

## 2015-02-15 DIAGNOSIS — Z87891 Personal history of nicotine dependence: Secondary | ICD-10-CM | POA: Insufficient documentation

## 2015-02-15 DIAGNOSIS — Z8619 Personal history of other infectious and parasitic diseases: Secondary | ICD-10-CM | POA: Diagnosis not present

## 2015-02-15 DIAGNOSIS — F1020 Alcohol dependence, uncomplicated: Secondary | ICD-10-CM | POA: Diagnosis not present

## 2015-02-15 DIAGNOSIS — Z79899 Other long term (current) drug therapy: Secondary | ICD-10-CM | POA: Insufficient documentation

## 2015-02-15 DIAGNOSIS — I10 Essential (primary) hypertension: Secondary | ICD-10-CM | POA: Insufficient documentation

## 2015-02-15 HISTORY — DX: Other psychoactive substance abuse, uncomplicated: F19.10

## 2015-02-15 NOTE — ED Notes (Signed)
MD requesting to hold labs at this time.

## 2015-02-15 NOTE — Discharge Instructions (Signed)
°Emergency Department Resource Guide °1) Find a Doctor and Pay Out of Pocket °Although you won't have to find out who is covered by your insurance plan, it is a good idea to ask around and get recommendations. You will then need to call the office and see if the doctor you have chosen will accept you as a new patient and what types of options they offer for patients who are self-pay. Some doctors offer discounts or will set up payment plans for their patients who do not have insurance, but you will need to ask so you aren't surprised when you get to your appointment. ° °2) Contact Your Local Health Department °Not all health departments have doctors that can see patients for sick visits, but many do, so it is worth a call to see if yours does. If you don't know where your local health department is, you can check in your phone book. The CDC also has a tool to help you locate your state's health department, and many state websites also have listings of all of their local health departments. ° °3) Find a Walk-in Clinic °If your illness is not likely to be very severe or complicated, you may want to try a walk in clinic. These are popping up all over the country in pharmacies, drugstores, and shopping centers. They're usually staffed by nurse practitioners or physician assistants that have been trained to treat common illnesses and complaints. They're usually fairly quick and inexpensive. However, if you have serious medical issues or chronic medical problems, these are probably not your best option. ° °No Primary Care Doctor: °- Call [facility] at  832-8000 - they can help you locate a primary care doctor that  accepts your insurance, provides certain services, etc. °- Physician Referral Service- 1-800-533-3463 ° °Chronic Pain Problems: °Organization         Address  Phone   Notes  ° [facility]  (336) 297-2271 Patients need to be referred by their primary care doctor.  ° °Medication  Assistance: °Organization         Address  Phone   Notes  °[place] [facility] [address] °[place], [place] [address] (336) 641-8030 --Must be a resident of [place] °-- Must have NO insurance coverage whatsoever (no Medicaid/ Medicare, etc.) °-- The pt. MUST have a primary care doctor that directs their care regularly and follows them in the community °  °[facility]  (866) 331-1348   °[employer]  (888) 892-1162   ° °Agencies that provide inexpensive medical care: °Organization         Address  Phone   Notes  °[facility]  (336) 832-8035   °[facility]    (336) 832-7272   °[facility] [address] °[place], [place] [address] (336) 832-4777   °[facility] [address], °[place] (336) 271-4999   °[facility]    (336) 373-0678   °[facility]    (336) 272-1050   °[facility] ° [address], [place] Phone:  (336) 832-4444, Fax:  (336) 832-4440 Hours of Operation:  9 am - 6 pm, M-F.  Also accepts Medicaid/Medicare and self-pay.  °[facility] ° [address],  Phone: (336) 832-3150, Fax: (336) 832-3151. Hours of Operation:  8:30 am - 5:30 pm, M-F.  Also accepts Medicaid and self-pay.  °[facility] [place] [address], [place] Phone: (336) 878-6027   °[facility] [address], [place], [place] (336)723-1848, Ext. 123 Mondays & Thursdays: 7-9 AM.  First 15 patients are seen on a first come, first serve basis. °  ° °Medicaid-accepting [place] Providers: ° °Organization         Address  Phone   Notes  °[facility] [address], [place] (336) 641-2100 Also accepts self-pay patients.  °[facility] [address], [place] ° (336) 856-9996   °[facility] [address], [place]  (336) 288-8857   °[facility] [address] [place] [address], [place] (336) 299-7000   °Veita Bland [address], [place]  ° (336) 373-1557 Only accepts [place] Access Medicaid patients after they have their name applied to their card.  ° °Self-Pay (no insurance) in [place]: ° °Organization         Address  Phone   Notes  °Sickle Cell Patients, [facility] [address], [place] (336) 832-1970   °[facility] [address], [place] (336) 832-4400   ° [facility] ° [address], [place] (336) 992-4800   °[facility]/Dr. Osei-Bonsu ° [address] [place] [address], [place] or [address], [place] (336) 841-8500 Phone number for both [place] and [place] locations is the same.  °[facility] [address] [place] [address], [place] (336) 299-0000   °[facility] [address] [place] [address], [place] or [address] (336) 852-7530 °(336) 878-2260   °[facility] [address], [place] (336) 350-1642, phone; (336) 294-5005, fax Sees patients 1st and 3rd Saturday of every month.  Must not qualify for public or private insurance (i.e. Medicaid, Medicare, [place] Health Choice, Veterans' Benefits) • Household income should be no more than 200% of the poverty level •The clinic cannot treat you if you are pregnant or think you are pregnant • Sexually transmitted diseases are not treated at the clinic.  ° ° °Dental Care: °Organization         Address  Phone  Notes  °[place] [facility] [address], [place] (336) 641-6152 Accepts children up to age 21 who are enrolled in Medicaid or [place] Health Choice; pregnant women with a Medicaid card; and children who have applied for Medicaid or [place] Health Choice, but were declined, whose parents can pay a reduced fee at time of service.  °[place]  [facility] [place]  [address], [place] (336) 641-7733 Accepts children up to age 21 who are enrolled in Medicaid or [place] Health Choice; pregnant women with a Medicaid card; and children who have applied for Medicaid or [place] Health Choice, but were declined, whose parents can pay a reduced fee at time of service.  °[facility] ° [address],  (336) 641-4533 Patients are seen by appointment only. Walk-ins are not accepted. [facility] will see patients 18 years of age and older. °Monday - Tuesday (8am-5pm) °Most Wednesdays (8:30-5pm) °$30 per visit, cash only  °[facility] ° [address], [place] (336) 641-4533 Patients are seen by appointment only. Walk-ins are not accepted. [facility] will see patients 18 years of age and older. °One   Wednesday Evening (Monthly: Volunteer Based).  $30 per visit, cash only  °[facility]  (919) 537-3737 for adults; Children under age 4, call Graduate Pediatric Dentistry at (919) [phone]. Children aged 4-14, please call (919) [phone] to request a pediatric application. ° Dental services are provided in all areas of dental care including fillings, crowns and bridges, complete and partial dentures, implants, gum treatment, root canals, and extractions. Preventive care is also provided. Treatment is provided to both adults and children. °Patients are selected via a lottery and there is often a waiting list. °  °[facility] [address], °[place] ° (336) 763-8833 [email or web address].[email or web address] °  °[facility] [address], [place], [place] (336)723-1848, Ext. 123 Second and Fourth Thursday of each month, opens at 6:30 AM; Clinic ends at 9 AM.  Patients are seen on a first-come first-served basis, and a limited number are seen during each clinic.  ° °[facility] ° [address], [place], [place] (336) 723-7904    Eligibility Requirements °You must have lived in [place], [place], or [place] counties for at least the last three months. °  You cannot be eligible for state or federal sponsored healthcare insurance, including Veterans Administration, Medicaid, or Medicare. °  You generally cannot be eligible for healthcare insurance through your employer.  °  How to apply: °Eligibility screenings are held every Tuesday and Wednesday afternoon from 1:00 pm until 4:00 pm. You do not need an appointment for the interview!  °[facility] [address], [place], [place] 336-631-2330   °[facility]  336-342-8273   °[facility]  336-703-3100   °[facility]  336-570-6415   ° °Behavioral Health Resources in the Community: °Intensive Outpatient Programs °Organization         Address  Phone  Notes  °[facility] [address], [place], [place] 336-878-6098   °[facility] [address], [place], [place] 336-832-9800   °[facility] [address], [place], [place] ° 336-882-2125   °[facility] [address],  °[place], [place] 1-800-853-5163 or 336-641-4981   °Substance Abuse Resources °Organization         Address  Phone  Notes  °[facility]  336-882-2125   °[facility]  336-784-9470   °[facility]  336-285-9073   °[facility]  336-845-3988   °Residential & Outpatient Substance Abuse Program  1-800-659-3381   °Psychological Services °Organization         Address  Phone  Notes  °[facility]  336- 832-9600   °[facility]  336- 378-7881   °[facility] [address], [place] 1-800-853-5163 or 336-641-4981   ° °Mobile Crisis Teams °Organization         Address  Phone  Notes  °[facility], Mobile Crisis Care Unit  1-877-626-1772   °Assertive °[facility] ° [address]  [place], [place] 336-834-9664   °Sharon DeEsch [address] °[place] 336-554-5454   ° °Self-Help/Support Groups °Organization         Address  Phone             Notes  °[facility] - variety of support groups  336- 373-1402 Call for more information  °Narcotics Anonymous (NA), [facility] [address], °[place]  2 meetings at this location  ° °  Residential Treatment Programs Organization         Address  Phone  Notes  [facility] [address]    [place]  1-3867627644   [facility]  [address], [place] [phone], [place], [place]   [place], [place] [phone] Admissions: 8am-3pm M-F  [facility] [place] [address]    [place], [place] [phone]   [facility] [address] [place], [place], [place]   [facility] [address]  [place], [place]   [facility] - Intensive Outpatient [address], [place], [place]   [facility] [address]  [place], [place] 1-(562)267-5412 or 539-523-5725   [facility] [address] [place], [place] Accepts Medicaid  [facility] [address],  [place] 1-(229)381-3197 Substance Abuse/Addiction Treatment   [facility] Organization         Address  Phone  Notes  [facility]  551-715-7476   Domenic Schwab, PhD [address] [place] [place], [place]   732-035-7835 or 573-062-3876   [place], [place] [phone]   [facility] [address] [place], [place] [phone] Insurance/Medicaid/sponsorship through [facility] [address] [place]                                    [place], [place] [phone] [facility] [address], [place] [phone]    Dr. Adele Schilder  [phone]   [facility] [place] Dept. 1) [address] [place] 2) [place] 3)  [address], [place] [phone]  [phone]   [place] (203)649-5187 or [phone] (After Hours)      Take your usual prescriptions as previously directed.  Call your regular medical doctor today to schedule a follow up appointment within the next 2 days. Call the detox resources given to you today to assist you with your alcohol detox.  Return to the Emergency Department immediately sooner if worsening.

## 2015-02-15 NOTE — Progress Notes (Signed)
CSW met with Dr. McManus who has agreed to cancel this TTS order.  We met with the patient at bedside to present with outpatient resources.      , MSW, LCSW, LCAS Clinical Social Worker [phone] 

## 2015-02-15 NOTE — ED Provider Notes (Signed)
CSN: [id number]     Arrival date & time 02/15/15  1115 History   First MD Initiated Contact with Patient 02/15/15 [phone]     Chief Complaint  Patient presents with  . ETOH Detox       HPI Pt was seen at 0945. Per pt, c/o gradual onset and persistence of constant etoh abuse for "a long time now." Pt states his last detox was last year. Pt states he "wants to go back again." Pt states [facility], and was then sent to the ED "to get my labs checked so I can go back there for 5 days." Pt denies any complaints currently. Denies withdrawal symptoms, with LD etoh this morning approximately 1 hour PTA. Denies SI, no SA, no HI, no hallucinations.    Past Medical History  Diagnosis Date  . Neuropathy   . Essential hypertension   . Hepatitis C   . 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 Alcohol abuse   . GERD (gastroesophageal reflux disease)   . GI bleed due to NSAIDs 12/13/2014  . Gallstones   . Fatty liver   . Heroin abuse   . Granulomatous gastritis   . Polysubstance abuse     etoh, heroin, marijuana   Past Surgical History  Procedure Laterality Date  . Esophagogastroduodenoscopy  06/07/2012    Procedure: ESOPHAGOGASTRODUODENOSCOPY (EGD);  Surgeon: Milus Banister, MD;  Location: [facility];  Service: Endoscopy;  Laterality: N/A;  may need treatment of varices  . Tonsillectomy    . Esophagogastroduodenoscopy N/A 12/14/2014    Procedure: ESOPHAGOGASTRODUODENOSCOPY (EGD);  Surgeon: Jerene Bears, MD;  Location[facility] END[facility]COPY;  Service: Endoscopy;  Laterality: N/A;   Family History  Problem Relation Age of Onset  . Stroke Father   . Prostate cancer Brother    History  Substance Use Topics  . Smoking status: Former Smoker -- 0.50 packs/day for 5 years    Types: Cigarettes  . Smokeless tobacco: Never Used     Comment: "quit smoking cigarettes in the 1970's"  . Alcohol Use: 16.8 oz/week    28 Glasses of wine per week     Comment: 12/13/2014 "drank 1 pint of wine/day til I stopped last Saturday"     Review of Systems ROS: Statement: All systems negative except as marked or noted in the HPI; Constitutional: Negative for fever and chills. ; ; Eyes: Negative for eye pain, redness and discharge. ; ; ENMT: Negative for ear pain, hoarseness, nasal congestion, sinus pressure and sore throat. ; ; Cardiovascular: Negative for chest pain, palpitations, diaphoresis, dyspnea and peripheral edema. ; ; Respiratory: Negative for cough, wheezing and stridor. ; ; Gastrointestinal: Negative for nausea, vomiting, diarrhea, abdominal pain, blood in stool, hematemesis, jaundice and rectal bleeding. . ; ; Genitourinary: Negative for dysuria, flank pain and hematuria. ; ; Musculoskeletal: Negative for back pain and neck pain. Negative for swelling and trauma.; ; Skin: Negative for pruritus, rash, abrasions, blisters, bruising and skin lesion.; ; Neuro: Negative for headache, lightheadedness and neck stiffness. Negative for weakness, altered level of consciousness , altered mental status, extremity weakness, paresthesias, involuntary movement, seizure and syncope.; Psych:  No SI, no SA, no HI, no hallucinations.    Allergies  Review of patient's allergies indicates no known allergies.  Home Medications   Prior to Admission medications   Medication Sig Start Date End Date Taking? Authorizing Provider  chlordiazePOXIDE (LIBRIUM) 5 MG capsule Take 5 mg by mouth daily as needed for anxiety.  09/20/14  Yes Historical  Provider, MD  folic acid (FOLVITE) 1 MG tablet Take 1 tablet (1 mg total) by mouth daily. 12/15/14  Yes Thurnell Lose, MD  metoprolol tartrate (LOPRESSOR) 25 MG tablet Take 3 tablets (75 mg total) by mouth 2 (two) times daily. 12/15/14  Yes Thurnell Lose, MD  Multiple Vitamin (ONE-A-DAY MENS PO) Take 1 tablet by mouth daily.   Yes Historical Provider, MD  omeprazole (PRILOSEC) 40 MG capsule Take 1 capsule (40 mg total) by mouth daily. For acid reflux 12/15/14  Yes Thurnell Lose, MD  traZODone  (DESYREL) 100 MG tablet Take 1 tablet (100 mg total) by mouth at bedtime as needed for sleep. 02/05/14  Yes Encarnacion Slates, NP  thiamine 100 MG tablet Take 1 tablet (100 mg total) by mouth daily. Patient not taking: Reported on 02/15/2015 12/15/14   Thurnell Lose, MD   BP 142/81 mmHg  Pulse 109  Temp(Src) 98 F (36.7 C) (Oral)  Resp 18  SpO2 100% Physical Exam  0950: Physical examination:  Nursing notes reviewed; Vital signs and O2 SAT reviewed;  Constitutional: Well developed, Well nourished, Well hydrated, In no acute distress; Head:  Normocephalic, atraumatic; Eyes: EOMI, PERRL, No scleral icterus; ENMT: Mouth and pharynx normal, Mucous membranes moist; Neck: Supple, Full range of motion, No lymphadenopathy; Cardiovascular: Regular rate and rhythm, No gallop; Respiratory: Breath sounds clear & equal bilaterally, No wheezes.  Speaking full sentences with ease, Normal respiratory effort/excursion; Chest: Nontender, Movement normal; Abdomen: Soft, Nontender, Nondistended, Normal bowel sounds; Genitourinary: No CVA tenderness; Extremities: Pulses normal, No tenderness, No edema. No obvious tremor.; Neuro: AA&Ox3, Major CN grossly intact.  Speech clear. Moves all extremities spontaneously. Climbs on and off stretcher easily by himself. Gait steady.; Skin: Color normal, Warm, Dry.; Psych:  Full affect, denies SI, no hallucinations.     ED Course  Procedures     EKG Interpretation None      MDM  MDM Reviewed: nursing note, previous chart and vitals Reviewed previous: labs     1015:  Social Worker called [facility]: she has confirmed that pt/pt's daughter was not instructed to come to the ED for labs to go back to [facility] for 5 days of detox, but rather pt's daughter was given a packet of outpatient resources. Pt given outpt resources now by Education officer, museum. Pt appears clinically sober, walking around ED exam room with steady upright and gait, resps easy, awake/alert, NAD. Pt is agreeable with this plan  and is calling his family to come pick him up.   1030:  Pt was seen walking out of the ED before receiving his d/c papers.       Francine Graven, DO 02/17/15 1555

## 2015-02-15 NOTE — ED Notes (Signed)
TTS called [facility] and verified protocol for detox. Myself, TTS, and the MD instructed patient on resources, inpatient and outpatient, in the community for him to contact after discharge. MD went to get discharge papers. Pt called his daughter. After coming out of another room a few minutes later patient had left with detox referral sheet. Did not sign for discharge.

## 2015-02-15 NOTE — ED Notes (Signed)
Pt requests ETOH detox from wine, pt states last drink was 1 pint of wine this morning. Pt states he normally drinks approximately 1.5 "fifths" of wine per day.

## 2015-02-20 ENCOUNTER — Ambulatory Visit: Payer: Self-pay | Admitting: Internal Medicine

## 2015-03-25 DIAGNOSIS — H40023 Open angle with borderline findings, high risk, bilateral: Secondary | ICD-10-CM | POA: Diagnosis not present

## 2015-03-25 DIAGNOSIS — H18413 Arcus senilis, bilateral: Secondary | ICD-10-CM | POA: Diagnosis not present

## 2015-03-25 DIAGNOSIS — H5212 Myopia, left eye: Secondary | ICD-10-CM | POA: Diagnosis not present

## 2015-03-26 ENCOUNTER — Other Ambulatory Visit: Payer: Self-pay | Admitting: Internal Medicine

## 2015-04-09 DIAGNOSIS — R350 Frequency of micturition: Secondary | ICD-10-CM | POA: Diagnosis not present

## 2015-04-09 DIAGNOSIS — E785 Hyperlipidemia, unspecified: Secondary | ICD-10-CM | POA: Diagnosis not present

## 2015-04-09 DIAGNOSIS — E039 Hypothyroidism, unspecified: Secondary | ICD-10-CM | POA: Diagnosis not present

## 2015-04-12 ENCOUNTER — Emergency Department (HOSPITAL_COMMUNITY): Payer: Medicare Other

## 2015-04-12 ENCOUNTER — Encounter (HOSPITAL_COMMUNITY): Payer: Self-pay

## 2015-04-12 ENCOUNTER — Inpatient Hospital Stay (HOSPITAL_COMMUNITY)
Admission: EM | Admit: 2015-04-12 | Discharge: 2015-04-16 | DRG: 282 | Disposition: A | Discharge status: Home / Self Care | Payer: Medicare Other | Attending: Cardiology | Admitting: Cardiology

## 2015-04-12 DIAGNOSIS — K7460 Unspecified cirrhosis of liver: Secondary | ICD-10-CM | POA: Diagnosis not present

## 2015-04-12 DIAGNOSIS — J449 Chronic obstructive pulmonary disease, unspecified: Secondary | ICD-10-CM | POA: Diagnosis not present

## 2015-04-12 DIAGNOSIS — G629 Polyneuropathy, unspecified: Secondary | ICD-10-CM | POA: Diagnosis not present

## 2015-04-12 DIAGNOSIS — B1920 Unspecified viral hepatitis C without hepatic coma: Secondary | ICD-10-CM | POA: Diagnosis present

## 2015-04-12 DIAGNOSIS — D649 Anemia, unspecified: Secondary | ICD-10-CM | POA: Diagnosis not present

## 2015-04-12 DIAGNOSIS — K760 Fatty (change of) liver, not elsewhere classified: Secondary | ICD-10-CM | POA: Diagnosis present

## 2015-04-12 DIAGNOSIS — B1710 Acute hepatitis C without hepatic coma: Secondary | ICD-10-CM | POA: Diagnosis present

## 2015-04-12 DIAGNOSIS — I214 Non-ST elevation (NSTEMI) myocardial infarction: Principal | ICD-10-CM | POA: Diagnosis present

## 2015-04-12 DIAGNOSIS — Z8249 Family history of ischemic heart disease and other diseases of the circulatory system: Secondary | ICD-10-CM | POA: Diagnosis not present

## 2015-04-12 DIAGNOSIS — Z79899 Other long term (current) drug therapy: Secondary | ICD-10-CM

## 2015-04-12 DIAGNOSIS — B182 Chronic viral hepatitis C: Secondary | ICD-10-CM | POA: Diagnosis not present

## 2015-04-12 DIAGNOSIS — R079 Chest pain, unspecified: Secondary | ICD-10-CM | POA: Diagnosis not present

## 2015-04-12 DIAGNOSIS — I10 Essential (primary) hypertension: Secondary | ICD-10-CM | POA: Diagnosis not present

## 2015-04-12 DIAGNOSIS — Z87891 Personal history of nicotine dependence: Secondary | ICD-10-CM

## 2015-04-12 DIAGNOSIS — R7989 Other specified abnormal findings of blood chemistry: Secondary | ICD-10-CM

## 2015-04-12 DIAGNOSIS — D696 Thrombocytopenia, unspecified: Secondary | ICD-10-CM | POA: Diagnosis not present

## 2015-04-12 DIAGNOSIS — I200 Unstable angina: Secondary | ICD-10-CM | POA: Diagnosis not present

## 2015-04-12 DIAGNOSIS — K219 Gastro-esophageal reflux disease without esophagitis: Secondary | ICD-10-CM | POA: Diagnosis present

## 2015-04-12 HISTORY — DX: Other specified abnormal findings of blood chemistry: R79.89

## 2015-04-12 HISTORY — DX: Anemia, unspecified: D64.9

## 2015-04-12 HISTORY — DX: Supraventricular tachycardia, unspecified: I47.10

## 2015-04-12 HISTORY — DX: Non-ST elevation (NSTEMI) myocardial infarction: I21.4

## 2015-04-12 HISTORY — DX: Abnormal electrocardiogram (ECG) (EKG): R94.31

## 2015-04-12 HISTORY — DX: Supraventricular tachycardia: I47.1

## 2015-04-12 HISTORY — DX: Thrombocytopenia, unspecified: D69.6

## 2015-04-12 MED ORDER — NITROGLYCERIN 0.4 MG SL SUBL: 0.4000 mg | SUBLINGUAL_TABLET | SUBLINGUAL | Status: DC | PRN

## 2015-04-12 NOTE — ED Provider Notes (Signed)
CSN: [id number]     Arrival date & time 04/12/15  2244 History   This chart was scribed for Varney Biles, MD by Evelene Croon, ED Scribe. This patient was seen in room B16C/B16C and the patient's care was started 11:41 PM.    Chief Complaint  Patient presents with  . Chest Pain    The history is provided by the patient and medical records. No language interpreter was used.     HPI Comments:  Nathan Frank is a 74 y.o. male brought in by ambulance, who presents to the Emergency Department complaining of dull constant central CP onset ~2100 while watching TV. He reports associated LUE pain. Pt was given nitro PTA without relief. He also complains of dark stools today; reports  4-5 BMs with dark stools. He denies nausea, SOB, dizziness and diaphoresis. He also denies h/o similar CP, h/o MI and illicit drug use. He is a former smoker. No ETOH in about  2 months.  In March 2016 he was admitted for black tarry stools and weakness, had an upper Gi which found stomach gastritis and stomach ulcers PCP: Noah Delaine  Past Medical History  Diagnosis Date  . Neuropathy   . Essential hypertension   . Hepatitis C   . 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 Alcohol abuse   . GERD (gastroesophageal reflux disease)   . GI bleed due to NSAIDs 12/13/2014  . Gallstones   . Fatty liver   . Heroin abuse     "I haven't done that since I don't know when."  . Granulomatous gastritis   . Polysubstance abuse     Rare marijuana.  No EtOH x 2 months.    Past Surgical History  Procedure Laterality Date  . Esophagogastroduodenoscopy  06/07/2012    Procedure: ESOPHAGOGASTRODUODENOSCOPY (EGD);  Surgeon: Milus Banister, MD;  Location: [place];  Service: Endoscopy;  Laterality: N/A;  may need treatment of varices  . Tonsillectomy    . Esophagogastroduodenoscopy N/A 12/14/2014    Procedure: ESOPHAGOGASTRODUODENOSCOPY (EGD);  Surgeon: Jerene Bears, MD;  Location: [facility] ENDOSCOPY;  Service: Endoscopy;  Laterality: N/A;  .  Circumcision     Family History  Problem Relation Age of Onset  . Stroke Father   . Prostate cancer Brother   . Heart disease Mother     Pacemaker   History  Substance Use Topics  . Smoking status: Former Smoker -- 0.50 packs/day for 5 years    Types: Cigarettes  . Smokeless tobacco: Never Used     Comment: "quit smoking cigarettes in the 1970's"  . Alcohol Use: 16.8 oz/week    28 Glasses of wine per week     Comment: 12/13/2014 "drank 1 pint of wine/day til I stopped last Saturday"    Review of Systems  Constitutional: Negative for fever, chills and diaphoresis.  Respiratory: Negative for shortness of breath.   Cardiovascular: Positive for chest pain.  Gastrointestinal: Negative for nausea.       + Dark Stools  Neurological: Negative for dizziness.  All other systems reviewed and are negative.     Allergies  Review of patient's allergies indicates no known allergies.  Home Medications   Prior to Admission medications   Medication Sig Start Date End Date Taking? Authorizing Provider  metoprolol tartrate (LOPRESSOR) 25 MG tablet Take 3 tablets (75 mg total) by mouth 2 (two) times daily. 12/15/14  Yes Thurnell Lose, MD  Multiple Vitamin (ONE-A-DAY MENS PO) Take 1 tablet by mouth daily.   Yes Historical  Provider, MD  omeprazole (PRILOSEC) 40 MG capsule Take 1 capsule (40 mg total) by mouth daily. For acid reflux 12/15/14  Yes Thurnell Lose, MD  traMADol (ULTRAM) 50 MG tablet Take 50 mg by mouth every 6 (six) hours as needed for moderate pain.   Yes Historical Provider, MD  traZODone (DESYREL) 100 MG tablet Take 1 tablet (100 mg total) by mouth at bedtime as needed for sleep. 02/05/14  Yes Encarnacion Slates, NP   BP 166/99 mmHg  Pulse 64  Temp(Src) 98.6 F (37 C) (Oral)  Resp 22  Ht 6' (1.829 m)  Wt 185 lb (83.915 kg)  BMI 25.08 kg/m2  SpO2 97% Physical Exam  Constitutional: He is oriented to person, place, and time. He appears well-developed and well-nourished. No  distress.  HENT:  Head: Normocephalic and atraumatic.  Eyes: Conjunctivae are normal.  Neck: Normal range of motion. JVD present.  Cardiovascular: Normal rate and regular rhythm.   Murmur (Diastolic) heard. Radial pulses equal bilaterally  Pulmonary/Chest: Effort normal and breath sounds normal. No respiratory distress.  Abdominal: There is tenderness. There is no rebound and no guarding.  Diffuse upper qradrant and RLQ tenderness without peritoneal signs  Genitourinary: Guaiac negative stool.  No external hemorrhoids seen No bloody stool appreciated Guaiac negative  Musculoskeletal: Normal range of motion.  Neurological: He is alert and oriented to person, place, and time.  Skin: Skin is warm and dry.  Psychiatric: He has a normal mood and affect.  Nursing note and vitals reviewed.   ED Course  Procedures   DIAGNOSTIC STUDIES:  Oxygen Saturation is 98% on RA, normal by my interpretation.    COORDINATION OF CARE:  11:55 PM Discussed treatment plan with pt at bedside and pt agreed to plan.  Labs Review Labs Reviewed  CBC - Abnormal; Notable for the following:    RBC 4.21 (*)    HCT 38.2 (*)    RDW 15.8 (*)    Platelets 149 (*)    All other components within normal limits  BASIC METABOLIC PANEL - Abnormal; Notable for the following:    CO2 21 (*)    Glucose, Bld 123 (*)    Calcium 8.5 (*)    All other components within normal limits  TROPONIN I - Abnormal; Notable for the following:    Troponin I 0.06 (*)    All other components within normal limits  PROTIME-INR - Abnormal; Notable for the following:    Prothrombin Time 17.4 (*)    All other components within normal limits  APTT  URINE RAPID DRUG SCREEN, HOSP PERFORMED  POC OCCULT BLOOD, ED  TYPE AND SCREEN    Imaging Review Dg Chest 2 View  04/12/2015   CLINICAL DATA:  Left-sided chest pain radiating down the left arm  EXAM: CHEST  2 VIEW  COMPARISON:  02/20/2014  FINDINGS: Trace bilateral pleural effusion.  No edema or pneumonia. Hyperinflation.  Chronic mild cardiomegaly.  Stable aortic and hilar contours.  IMPRESSION: 1. Trace pleural effusions. 2. Hyperinflation, likely from COPD.   Electronically Signed   By: Monte Fantasia M.D.   On: 04/12/2015 23:45     EKG Interpretation   Date/Time:  Friday April 12 2015 22:50:48 EDT Ventricular Rate:  66 PR Interval:  150 QRS Duration: 143 QT Interval:  493 QTC Calculation: 517 R Axis:   -128 Text Interpretation:  Sinus or ectopic atrial rhythm No significant change  since last tracing Confirmed by Kathrynn Humble, MD, Thelma Comp [phone]) on 04/12/2015  11:09:09 PM      MDM   Final diagnoses:  Chest pain  NSTEMI (non-ST elevated myocardial infarction)    I personally performed the services described in this documentation, which was scribed in my presence. The recorded information has been reviewed and is accurate.  Pt comes in with cc of chest pain. Differential diagnosis includes: ACS syndrome CHF exacerbation Valvular disorder Myocarditis Pericarditis Pericardial effusion Pneumonia Pleural effusion Pulmonary edema PE Musculoskeletal pain  Pt's chest pain has typical features - described as heaviness, moderately severe with L arm radiation. Pt has hx of PUD/Gastritis as well, and complained of dark tarry stools as well - but he has no gross melena on rectal exam, and his DRE is neg for bloody stools. He has slight abd pain as well. Will give him protonic bolus for possible gastritis.  Pt has no CAD. He has HTN, and substance abuse hx - but he reports no cocaine use and he doesn't drink alcohol anymore either. Troponin came in slightly elevated, and with some typical feature of this chest pain that i can't explain to be from any other source besides heart, i consulted Cards and Dr. Jenkins Rouge will see the patient.  ASA given. Holding anticoagulants. Nitro paste ordered. Vascular exam is normal - and we dont think pt has dissection.  CRITICAL  CARE Performed by: Varney Biles   Total critical care time: 35 min  Critical care time was exclusive of separately billable procedures and treating other patients.  Critical care was necessary to treat or prevent imminent or life-threatening deterioration.  Critical care was time spent personally by me on the following activities: development of treatment plan with patient and/or surrogate as well as nursing, discussions with consultants, evaluation of patient's response to treatment, examination of patient, obtaining history from patient or surrogate, ordering and performing treatments and interventions, ordering and review of laboratory studies, ordering and review of radiographic studies, pulse oximetry and re-evaluation of patient's condition.    Varney Biles, MD 04/13/15 0201

## 2015-04-12 NOTE — ED Notes (Signed)
Pt arrived via EMS from home c/o centralized chest pain starting around 2130 and radiating down left arm.  EMS gave 2 nitro in route pain came down to 5/10.  Pt c/o black tarry stools x1 day, no aspirin given.

## 2015-04-13 ENCOUNTER — Encounter (HOSPITAL_COMMUNITY): Payer: Self-pay | Admitting: Cardiology

## 2015-04-13 DIAGNOSIS — B1920 Unspecified viral hepatitis C without hepatic coma: Secondary | ICD-10-CM | POA: Diagnosis present

## 2015-04-13 DIAGNOSIS — Z8249 Family history of ischemic heart disease and other diseases of the circulatory system: Secondary | ICD-10-CM | POA: Diagnosis not present

## 2015-04-13 DIAGNOSIS — I214 Non-ST elevation (NSTEMI) myocardial infarction: Secondary | ICD-10-CM | POA: Diagnosis not present

## 2015-04-13 DIAGNOSIS — I10 Essential (primary) hypertension: Secondary | ICD-10-CM | POA: Diagnosis not present

## 2015-04-13 DIAGNOSIS — K219 Gastro-esophageal reflux disease without esophagitis: Secondary | ICD-10-CM | POA: Diagnosis present

## 2015-04-13 DIAGNOSIS — K760 Fatty (change of) liver, not elsewhere classified: Secondary | ICD-10-CM | POA: Diagnosis present

## 2015-04-13 DIAGNOSIS — I200 Unstable angina: Secondary | ICD-10-CM | POA: Diagnosis not present

## 2015-04-13 DIAGNOSIS — B182 Chronic viral hepatitis C: Secondary | ICD-10-CM | POA: Diagnosis not present

## 2015-04-13 DIAGNOSIS — D696 Thrombocytopenia, unspecified: Secondary | ICD-10-CM | POA: Diagnosis not present

## 2015-04-13 DIAGNOSIS — Z79899 Other long term (current) drug therapy: Secondary | ICD-10-CM | POA: Diagnosis not present

## 2015-04-13 DIAGNOSIS — K7460 Unspecified cirrhosis of liver: Secondary | ICD-10-CM | POA: Diagnosis not present

## 2015-04-13 DIAGNOSIS — G629 Polyneuropathy, unspecified: Secondary | ICD-10-CM | POA: Diagnosis not present

## 2015-04-13 DIAGNOSIS — Z87891 Personal history of nicotine dependence: Secondary | ICD-10-CM | POA: Diagnosis not present

## 2015-04-13 DIAGNOSIS — D649 Anemia, unspecified: Secondary | ICD-10-CM | POA: Diagnosis present

## 2015-04-13 LAB — BASIC METABOLIC PANEL
Anion gap: 10 (ref 5–15)
Anion gap: 9 (ref 5–15)
BUN: 9 mg/dL (ref 6–20)
BUN: 11 mg/dL (ref 6–20)
CALCIUM: 8.2 mg/dL — AB (ref 8.9–10.3)
CO2: 21 mmol/L — ABNORMAL LOW (ref 22–32)
CO2: 24 mmol/L (ref 22–32)
CREATININE: 1.06 mg/dL (ref 0.61–1.24)
CREATININE: 0.94 mg/dL (ref 0.61–1.24)
Calcium: 8.5 mg/dL — ABNORMAL LOW (ref 8.9–10.3)
Chloride: 107 mmol/L (ref 101–111)
Chloride: 110 mmol/L (ref 101–111)
GFR calc Af Amer: >60 mL/min (ref >60)
GFR calc Af Amer: >60 mL/min (ref >60)
GFR calc non Af Amer: >60 mL/min (ref >60)
GFR calc non Af Amer: >60 mL/min (ref >60)
GLUCOSE: 114 mg/dL — AB (ref 65–99)
Glucose, Bld: 123 mg/dL — ABNORMAL HIGH (ref 65–99)
Potassium: 3.6 mmol/L (ref 3.5–5.1)
Potassium: 3.9 mmol/L (ref 3.5–5.1)
SODIUM: 140 mmol/L (ref 135–145)
Sodium: 141 mmol/L (ref 135–145)

## 2015-04-13 LAB — TYPE AND SCREEN
ABO/RH(D): O POS
Antibody Screen: NEGATIVE

## 2015-04-13 LAB — CBC
HCT: 38.2 % — ABNORMAL LOW (ref 39.0–52.0)
HEMATOCRIT: 37.4 % — AB (ref 39.0–52.0)
HEMOGLOBIN: 13 g/dL (ref 13.0–17.0)
Hemoglobin: 13.3 g/dL (ref 13.0–17.0)
MCH: 31.6 pg (ref 26.0–34.0)
MCH: 31.6 pg (ref 26.0–34.0)
MCHC: 34.8 g/dL (ref 30.0–36.0)
MCHC: 34.8 g/dL (ref 30.0–36.0)
MCV: 91 fL (ref 78.0–100.0)
MCV: 90.7 fL (ref 78.0–100.0)
PLATELETS: 149 10*3/uL — AB (ref 150–400)
Platelets: 159 10*3/uL (ref 150–400)
RBC: 4.11 MIL/uL — ABNORMAL LOW (ref 4.22–5.81)
RBC: 4.21 MIL/uL — ABNORMAL LOW (ref 4.22–5.81)
RDW: 15.9 % — AB (ref 11.5–15.5)
RDW: 15.8 % — AB (ref 11.5–15.5)
WBC: 4.7 10*3/uL (ref 4.0–10.5)
WBC: 5.5 10*3/uL (ref 4.0–10.5)

## 2015-04-13 LAB — PROTIME-INR
INR: 1.41 (ref 0.00–1.49)
PROTHROMBIN TIME: 17.4 s — AB (ref 11.6–15.2)

## 2015-04-13 LAB — BRAIN NATRIURETIC PEPTIDE: B NATRIURETIC PEPTIDE 5: 1029.2 pg/mL — AB (ref 0.0–100.0)

## 2015-04-13 LAB — RAPID URINE DRUG SCREEN, HOSP PERFORMED
AMPHETAMINES: NOT DETECTED
BARBITURATES: NOT DETECTED
BENZODIAZEPINES: NOT DETECTED
Cocaine: NOT DETECTED
Opiates: NOT DETECTED
Tetrahydrocannabinol: NOT DETECTED

## 2015-04-13 LAB — HEPATIC FUNCTION PANEL
ALT: 29 U/L (ref 17–63)
AST: 71 U/L — ABNORMAL HIGH (ref 15–41)
Albumin: 2.8 g/dL — ABNORMAL LOW (ref 3.5–5.0)
Alkaline Phosphatase: 74 U/L (ref 38–126)
Bilirubin, Direct: 0.3 mg/dL (ref 0.1–0.5)
Indirect Bilirubin: 0.7 mg/dL (ref 0.3–0.9)
Total Bilirubin: 1 mg/dL (ref 0.3–1.2)
Total Protein: 6.7 g/dL (ref 6.5–8.1)

## 2015-04-13 LAB — TROPONIN I
TROPONIN I: 2.79 ng/mL — AB (ref <0.031)
TROPONIN I: 0.06 ng/mL — AB (ref <0.031)
Troponin I: 3.98 ng/mL (ref <0.031)
Troponin I: 7.72 ng/mL (ref <0.031)
Troponin I: 3.97 ng/mL (ref <0.031)

## 2015-04-13 LAB — TSH: TSH: 6.051 u[IU]/mL — AB (ref 0.350–4.500)

## 2015-04-13 LAB — HEPARIN LEVEL (UNFRACTIONATED)
Heparin Unfractionated: 0.13 IU/mL — ABNORMAL LOW (ref 0.30–0.70)
Heparin Unfractionated: 0.13 IU/mL — ABNORMAL LOW (ref 0.30–0.70)

## 2015-04-13 LAB — POC OCCULT BLOOD, ED: Fecal Occult Bld: NEGATIVE

## 2015-04-13 LAB — APTT: APTT: 30 s (ref 24–37)

## 2015-04-13 MED ORDER — LISINOPRIL 5 MG PO TABS
5.0000 mg | ORAL_TABLET | Freq: Every day | ORAL | Status: DC
  Administered 2015-04-13 – 2015-04-16 (×4): 5 mg via ORAL
  Filled 2015-04-13 (×4): qty 1

## 2015-04-13 MED ORDER — ACETAMINOPHEN 325 MG PO TABS: 650.0000 mg | ORAL_TABLET | ORAL | Status: DC | PRN

## 2015-04-13 MED ORDER — CLOPIDOGREL BISULFATE 75 MG PO TABS
75.0000 mg | ORAL_TABLET | Freq: Every day | ORAL | Status: DC
  Administered 2015-04-14 – 2015-04-15 (×2): 75 mg via ORAL
  Filled 2015-04-13 (×3): qty 1

## 2015-04-13 MED ORDER — CLOPIDOGREL BISULFATE 300 MG PO TABS: 600.0000 mg | ORAL_TABLET | Freq: Every day | ORAL | Status: DC

## 2015-04-13 MED ORDER — TRAMADOL HCL 50 MG PO TABS: 50.0000 mg | ORAL_TABLET | Freq: Four times a day (QID) | ORAL | Status: DC | PRN

## 2015-04-13 MED ORDER — NITROGLYCERIN 2 % TD OINT
1.0000 [in_us] | TOPICAL_OINTMENT | Freq: Four times a day (QID) | TRANSDERMAL | Status: DC
  Filled 2015-04-13: qty 30

## 2015-04-13 MED ORDER — MORPHINE SULFATE 4 MG/ML IJ SOLN
4.0000 mg | Freq: Once | INTRAMUSCULAR | Status: AC
  Administered 2015-04-13: 4 mg via INTRAVENOUS
  Filled 2015-04-13: qty 1

## 2015-04-13 MED ORDER — ASPIRIN 81 MG PO CHEW: 324.0000 mg | CHEWABLE_TABLET | Freq: Once | ORAL | Status: DC

## 2015-04-13 MED ORDER — ASPIRIN 300 MG RE SUPP: 300.0000 mg | RECTAL | Status: DC

## 2015-04-13 MED ORDER — NITROGLYCERIN 2 % TD OINT
1.0000 [in_us] | TOPICAL_OINTMENT | Freq: Once | TRANSDERMAL | Status: AC
  Administered 2015-04-13: 1 [in_us] via TOPICAL
  Filled 2015-04-13: qty 1

## 2015-04-13 MED ORDER — ASPIRIN 81 MG PO CHEW
324.0000 mg | CHEWABLE_TABLET | Freq: Once | ORAL | Status: AC
  Administered 2015-04-13: 324 mg via ORAL
  Filled 2015-04-13: qty 4

## 2015-04-13 MED ORDER — ASPIRIN 81 MG PO CHEW: 324.0000 mg | CHEWABLE_TABLET | ORAL | Status: DC

## 2015-04-13 MED ORDER — MORPHINE SULFATE 2 MG/ML IJ SOLN
INTRAMUSCULAR | Status: AC
  Filled 2015-04-13: qty 1

## 2015-04-13 MED ORDER — CLOPIDOGREL BISULFATE 300 MG PO TABS
600.0000 mg | ORAL_TABLET | ORAL | Status: AC
  Administered 2015-04-13: 600 mg via ORAL
  Filled 2015-04-13: qty 2

## 2015-04-13 MED ORDER — FENTANYL CITRATE (PF) 100 MCG/2ML IJ SOLN
50.0000 ug | Freq: Once | INTRAMUSCULAR | Status: AC
  Administered 2015-04-13: 50 ug via INTRAVENOUS
  Filled 2015-04-13: qty 2

## 2015-04-13 MED ORDER — ATORVASTATIN CALCIUM 80 MG PO TABS
80.0000 mg | ORAL_TABLET | Freq: Every day | ORAL | Status: DC
  Administered 2015-04-13 – 2015-04-15 (×3): 80 mg via ORAL
  Filled 2015-04-13 (×4): qty 1

## 2015-04-13 MED ORDER — NITROGLYCERIN IN D5W 200-5 MCG/ML-% IV SOLN
0.0000 ug/min | INTRAVENOUS | Status: DC
  Administered 2015-04-13: 10 ug/min via INTRAVENOUS
  Administered 2015-04-13: 5 ug/min via INTRAVENOUS
  Administered 2015-04-14: 20 ug/min via INTRAVENOUS
  Administered 2015-04-14: 15 ug/min via INTRAVENOUS
  Administered 2015-04-15: 20 ug/min via INTRAVENOUS
  Filled 2015-04-13 (×2): qty 250

## 2015-04-13 MED ORDER — ONDANSETRON HCL 4 MG/2ML IJ SOLN: 4.0000 mg | Freq: Four times a day (QID) | INTRAMUSCULAR | Status: DC | PRN

## 2015-04-13 MED ORDER — MORPHINE SULFATE 2 MG/ML IJ SOLN
2.0000 mg | Freq: Once | INTRAMUSCULAR | Status: AC
  Administered 2015-04-13: 2 mg via INTRAVENOUS

## 2015-04-13 MED ORDER — TRAZODONE HCL 100 MG PO TABS
100.0000 mg | ORAL_TABLET | Freq: Every evening | ORAL | Status: DC | PRN
  Administered 2015-04-13 – 2015-04-16 (×4): 100 mg via ORAL
  Filled 2015-04-13 (×6): qty 1

## 2015-04-13 MED ORDER — PANTOPRAZOLE SODIUM 40 MG PO TBEC
40.0000 mg | DELAYED_RELEASE_TABLET | Freq: Every day | ORAL | Status: DC
  Administered 2015-04-13 – 2015-04-16 (×4): 40 mg via ORAL
  Filled 2015-04-13 (×4): qty 1

## 2015-04-13 MED ORDER — MORPHINE SULFATE 2 MG/ML IJ SOLN
2.0000 mg | INTRAMUSCULAR | Status: DC | PRN
  Administered 2015-04-13: 2 mg via INTRAVENOUS
  Filled 2015-04-13: qty 1

## 2015-04-13 MED ORDER — METOPROLOL TARTRATE 50 MG PO TABS
75.0000 mg | ORAL_TABLET | Freq: Two times a day (BID) | ORAL | Status: DC
  Administered 2015-04-13 – 2015-04-16 (×7): 75 mg via ORAL
  Filled 2015-04-13 (×8): qty 1

## 2015-04-13 MED ORDER — ASPIRIN EC 81 MG PO TBEC
81.0000 mg | DELAYED_RELEASE_TABLET | Freq: Every day | ORAL | Status: DC
  Administered 2015-04-14 – 2015-04-15 (×2): 81 mg via ORAL
  Filled 2015-04-13 (×3): qty 1

## 2015-04-13 MED ORDER — HEPARIN (PORCINE) IN NACL 100-0.45 UNIT/ML-% IJ SOLN
1600.0000 [IU]/h | INTRAMUSCULAR | Status: DC
  Administered 2015-04-13: 1100 [IU]/h via INTRAVENOUS
  Administered 2015-04-14 – 2015-04-15 (×2): 1600 [IU]/h via INTRAVENOUS
  Filled 2015-04-13 (×5): qty 250

## 2015-04-13 NOTE — Plan of Care (Signed)
Problem: Phase III Progression Outcomes Goal: No anginal pain Outcome: Progressing Patient states his pain is 3 out of 10 after starting Nitroglycerin.

## 2015-04-13 NOTE — Plan of Care (Signed)
Cardiology On Call  Patient is a 74 yo man with history of ETOH, polysubstance abuse, alcoholic cirrhosis, hepatitis C, melena in 12/2014 and EGD showed gastritis felt to be due to NSAIDS, admitted last night with NSTEMI. Being treated with with ASA, high potency statin, beta blocker, heparin and NTG infusions. Has received Morphine 4 mg at 125 am, Fentanyl 50 mcg IV at 0237 am, Morphine 2 mg at 6 am, Morphine 2 mg at 1748. At this time of my evaluation, CP resolved. No distress. Awake and alert. Appears comfortable in bed. BP stable. No signs or symptoms to suggest HF. No murmur on exam. Extremities warm and perfused with strong pulses.   P:  Will start Plavix (600 mg po x1 now and then 75 mg po qd from tomorrow) Stop all narcotics to better assess angina.  Continue other meds Already on protonix given history of gastritis  Repeat trop x1  Check hepatic function and BNP  Will closely monitor for recurrence of CP, vitals signs and signs and symptoms of HF.    Wandra Mannan, MD

## 2015-04-13 NOTE — Progress Notes (Signed)
ANTICOAGULATION CONSULT NOTE - Initial Consult  Pharmacy Consult for Heparin  Indication: chest pain/ACS, rising troponin  No Known Allergies  Patient Measurements: Height: 6' (182.9 cm) Weight: 199 lb 4.8 oz (90.402 kg) IBW/kg (Calculated) : 77.6  Vital Signs: Temp: 98.7 F (37.1 C) (07/09 0319) Temp Source: Oral (07/09 0319) BP: 150/84 mmHg (07/09 0545) Pulse Rate: 58 (07/09 0545)  Labs:  Recent Labs  04/13/15 0012 04/13/15 0355  HGB 13.3 13.0  HCT 38.2* 37.4*  PLT 149* 159  APTT 30  --   LABPROT 17.4*  --   INR 1.41  --   CREATININE 0.94 1.06  TROPONINI 0.06* 3.98*    Estimated Creatinine Clearance: 67.1 mL/min (by C-G formula based on Cr of 1.06).   Medical History: Past Medical History  Diagnosis Date  . Neuropathy   . Essential hypertension   . Hepatitis C   . 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 Alcohol abuse   . GERD (gastroesophageal reflux disease)   . GI bleed due to NSAIDs 12/13/2014  . Gallstones   . Fatty liver   . Heroin abuse     "I haven't done that since I don't know when."  . Granulomatous gastritis   . Polysubstance abuse     Rare marijuana.  No EtOH x 2 months.    Assessment: Starting heparin per pharmacy for rising troponin. Hgb stable, renal function good. Noted that patient has recent history of GI bleed.   Goal of Therapy:  Heparin level 0.3-0.7 units/ml Monitor platelets by anticoagulation protocol: Yes   Plan:  -Will avoid bolus with recent GIB -Start heparin at 1100 units/hr -1400 HL -Daily CBC/HL -Monitor for bleeding  Narda Bonds 04/13/2015,6:21 AM

## 2015-04-13 NOTE — Progress Notes (Signed)
ANTICOAGULATION CONSULT NOTE - Follow Up Consult  Pharmacy Consult for Heparin Indication: chest pain/ACS  No Known Allergies  Patient Measurements: Height: 6' (182.9 cm) Weight: 199 lb 4.8 oz (90.402 kg) IBW/kg (Calculated) : 77.6 Heparin Dosing Weight:  90.4 kg  Vital Signs: Temp: 97.8 F (36.6 C) (07/09 1604) Temp Source: Oral (07/09 1604) BP: 121/51 mmHg (07/09 1604) Pulse Rate: 82 (07/09 1604)  Labs:  Recent Labs  04/13/15 0012 04/13/15 0355 04/13/15 1053 04/13/15 1530  HGB 13.3 13.0  --   --   HCT 38.2* 37.4*  --   --   PLT 149* 159  --   --   APTT 30  --   --   --   LABPROT 17.4*  --   --   --   INR 1.41  --   --   --   HEPARINUNFRC  --   --   --  0.13*  CREATININE 0.94 1.06  --   --   TROPONINI 0.06* 3.98* 7.72*  --     Estimated Creatinine Clearance: 67.1 mL/min (by C-G formula based on Cr of 1.06).   Assessment: Starting heparin per pharmacy for rising troponin. Hgb stable, renal function good. Noted that patient has recent history of GI bleed.   Anticoagulation: NSTEMI. CBC WNL. Heparin level 0.13 less than goal.  Goal of Therapy:  Heparin level 0.3-0.7 units/ml Monitor platelets by anticoagulation protocol: Yes   Plan:  Increase IV heparin to 1350 units/hr. Recheck level 6 hrs after rate is changed.   Daden Mahany S. Alford Highland, PharmD, BCPS Clinical Staff Pharmacist Pager [phone]  Eilene Ghazi Stillinger 04/13/2015,4:25 PM

## 2015-04-13 NOTE — Progress Notes (Signed)
The patient has had some continued pain.  EKG without acute changes.  I will start heparin.  His Hgb this AM was unchanged from the previous.

## 2015-04-13 NOTE — Progress Notes (Signed)
Third troponin level resulted at 7.72.  Rhonda Barrett, PA-C has been notified.

## 2015-04-13 NOTE — Progress Notes (Signed)
Report given to oncoming RN and she is aware of pt's condition. Pt rated pain 3/10. Pt is resting watching television. No further complaints.   Alexia Freestone , RN

## 2015-04-13 NOTE — Progress Notes (Signed)
CRITICAL VALUE ALERT  Critical value received:  Troponin 3.98   Date of notification:  04/13/2015  Time of notification:  0526   Critical value read back:Yes.    Nurse who received alert:  Alexia Freestone  MD notified (1st page):  M. Lynch  Time of first page:  0533  MD notified (2nd page):   Time of second page:  Responding MD:  M. Donnal Debar  Time MD responded:  [phone]

## 2015-04-13 NOTE — Progress Notes (Signed)
Heparin drip and IV nitro is infusing. Pt rates chest pain 4/10. Will continue to monitor.   Alexia Freestone, RN

## 2015-04-13 NOTE — H&P (Signed)
CARDIOLOGY ADMISSION NOTE  Patient ID: Nathan Frank MRN: [id number] DOB/AGE: 1941-04-02 74 y.o.  Admit date: 04/12/2015 Primary Physician   Thressa Sheller, MD Primary Cardiologist   None Chief Complaint    Chest pain  HPI:  The patient has a history of SVT.  We last saw him with this in 3/16 when he was admitted with GI bleed related to ETOH, NSAIDs and gastritis.  He has a history of polysubstance abuse and hep C cirrhosis.  He was managed medically for his transient SVT.  He did at that time have a prolonged QT that was treated with mag.  Echo demonstrated an EF of 50% without other acute abnormalities.   He presents now with chest pain.  He reports that this started at rest about 3 hours ago.  It is between sharp and dull.  It reached 8/10 in intensity.  He did not have associated symptoms such as N/V or diaphoresis.  He had no neck pain but he did have left arm discomfort. There has been no new shortness of breath, PND or orthopnea. There has reported presyncope or syncope.  In the ED he had no acute EKG changes.  However, he did have a very slight troponin elevation.   Of note the patient does report recent diarrhea that has been black.   Past Medical History  Diagnosis Date  . Neuropathy   . Essential hypertension   . Hepatitis C   . 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 Alcohol abuse   . GERD (gastroesophageal reflux disease)   . GI bleed due to NSAIDs 12/13/2014  . Gallstones   . Fatty liver   . Heroin abuse     "I haven't done that since I don't know when."  . Granulomatous gastritis   . Polysubstance abuse     Rare marijuana.  No EtOH x 2 months.     Past Surgical History  Procedure Laterality Date  . Esophagogastroduodenoscopy  06/07/2012    Procedure: ESOPHAGOGASTRODUODENOSCOPY (EGD);  Surgeon: Milus Banister, MD;  Location: [place];  Service: Endoscopy;  Laterality: N/A;  may need treatment of varices  . Tonsillectomy    . Esophagogastroduodenoscopy N/A 12/14/2014   Procedure: ESOPHAGOGASTRODUODENOSCOPY (EGD);  Surgeon: Jerene Bears, MD;  Location: [facility] ENDOSCOPY;  Service: Endoscopy;  Laterality: N/A;  . Circumcision      No Known Allergies No current facility-administered medications on file prior to encounter.   Current Outpatient Prescriptions on File Prior to Encounter  Medication Sig Dispense Refill  . metoprolol tartrate (LOPRESSOR) 25 MG tablet Take 3 tablets (75 mg total) by mouth 2 (two) times daily. 180 tablet 2  . Multiple Vitamin (ONE-A-DAY MENS PO) Take 1 tablet by mouth daily.    Marland Kitchen omeprazole (PRILOSEC) 40 MG capsule Take 1 capsule (40 mg total) by mouth daily. For acid reflux 60 capsule 2  . traZODone (DESYREL) 100 MG tablet Take 1 tablet (100 mg total) by mouth at bedtime as needed for sleep. 30 tablet 0   History   Social History  . Marital Status: Married    Spouse Name: N/A  . Number of Children: 3  . Years of Education: N/A   Occupational History  . Social worker    Social History Main Topics  . Smoking status: Former Smoker -- 0.50 packs/day for 5 years    Types: Cigarettes  . Smokeless tobacco: Never Used     Comment: "quit smoking cigarettes in the 1970's"  . Alcohol Use: 16.8 oz/week  28 Glasses of wine per week     Comment: 12/13/2014 "drank 1 pint of wine/day til I stopped last Saturday"  . Drug Use: Yes    Special: Heroin  . Sexual Activity: Not Currently    Birth Control/ Protection: Condom   Other Topics Concern  . Not on file   Social History Narrative   Lost one son to a gunshot.  Lives with wife.      Family History  Problem Relation Age of Onset  . Stroke Father   . Prostate cancer Brother   . Heart disease Mother     Pacemaker     ROS:  As stated in the HPI and negative for all other systems.   Physical Exam: Blood pressure 166/99, pulse 64, temperature 98.6 F (37 C), temperature source Oral, resp. rate 22, height 6' (1.829 m), weight 185 lb (83.915 kg), SpO2 97 %.  GENERAL:  Well  appearing HEENT:  Pupils equal round and reactive, fundi not visualized, oral mucosa unremarkable NECK:  No jugular venous distention, waveform within normal limits, carotid upstroke brisk and symmetric, no bruits, no thyromegaly LYMPHATICS:  No cervical, inguinal adenopathy LUNGS:  Clear to auscultation bilaterally BACK:  No CVA tenderness CHEST:  Unremarkable HEART:  PMI not displaced or sustained,S1 and S2 within normal limits, no S3, no S4, no clicks, no rubs, no murmurs ABD:  Flat, positive bowel sounds normal in frequency in pitch, no bruits, no rebound, no guarding, no midline pulsatile mass, no hepatomegaly, no splenomegaly EXT:  2 plus pulses throughout, no edema, no cyanosis no clubbing SKIN:  No rashes no nodules NEURO:  Cranial nerves II through XII grossly intact, motor grossly intact throughout PSYCH:  Cognitively intact, oriented to person place and time  Labs: Lab Results  Component Value Date   BUN 11 04/13/2015   Lab Results  Component Value Date   CREATININE 0.94 04/13/2015   Lab Results  Component Value Date   NA 141 04/13/2015   K 3.6 04/13/2015   CL 110 04/13/2015   CO2 21* 04/13/2015   Lab Results  Component Value Date   TROPONINI 0.06* 04/13/2015   Lab Results  Component Value Date   WBC 4.7 04/13/2015   HGB 13.3 04/13/2015   HCT 38.2* 04/13/2015   MCV 90.7 04/13/2015   PLT 149* 04/13/2015     Radiology:  CXR:  IMPRESSION: 1. Trace pleural effusions. 2. Hyperinflation, likely from COPD.  EKG:  NSR, rate 64, RBBB, QTc prolonged.  No acute ST T wave changes.  04/13/2015   ASSESSMENT AND PLAN:    CHEST PAIN:   Pain is suspicious for unstable angina.  However, the case is compounded by possible GI bleed.  For now he will remain on NTG paste.  I will hold off on heparin pending stool guaiac and follow up Hbg.  If he has no further pain and enzymes do not trend upward I would favor Lexiscan Myoview.  Otherwise, he would need cath if he is guaiac  negative.  HTN:  I will add ACE inhibitor to the beta blocker.   SUBSTANCE ABUSE:  He is no longer drinking or using other substances.  HISTORY OF GI BLEED:  As above.   SignedMinus Breeding 04/13/2015, 2:02 AM

## 2015-04-13 NOTE — Progress Notes (Signed)
ANTICOAGULATION CONSULT NOTE   Pharmacy Consult for Heparin  Indication: chest pain/ACS, rising troponin  No Known Allergies  Patient Measurements: Height: 6' (182.9 cm) Weight: 199 lb 4.8 oz (90.402 kg) IBW/kg (Calculated) : 77.6  Vital Signs: Temp: 98.5 F (36.9 C) (07/09 2023) Temp Source: Oral (07/09 2023) BP: 145/85 mmHg (07/09 2023) Pulse Rate: 63 (07/09 2023)  Labs:  Recent Labs  04/13/15 0012 04/13/15 0355 04/13/15 1053 04/13/15 1530 04/13/15 2034 04/13/15 2252  HGB 13.3 13.0  --   --   --   --   HCT 38.2* 37.4*  --   --   --   --   PLT 149* 159  --   --   --   --   APTT 30  --   --   --   --   --   LABPROT 17.4*  --   --   --   --   --   INR 1.41  --   --   --   --   --   HEPARINUNFRC  --   --   --  0.13*  --  0.13*  CREATININE 0.94 1.06  --   --   --   --   TROPONINI 0.06* 3.98* 7.72* 3.97* 2.79*  --     Estimated Creatinine Clearance: 67.1 mL/min (by C-G formula based on Cr of 1.06).   Medical History: Past Medical History  Diagnosis Date  . Neuropathy   . Essential hypertension   . Hepatitis C   . 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 Alcohol abuse   . GERD (gastroesophageal reflux disease)   . GI bleed due to NSAIDs 12/13/2014  . Gallstones   . Fatty liver   . Heroin abuse     "I haven't done that since I don't know when."  . Granulomatous gastritis   . Polysubstance abuse     Rare marijuana.  No EtOH x 2 months.    Assessment: Heparin per pharmacy for elevated troponin. Hgb stable, renal function good. Noted that patient has recent history of GI bleed.   Goal of Therapy:  Heparin level 0.3-0.7 units/ml Monitor platelets by anticoagulation protocol: Yes   Plan:  -Will avoid bolus with recent GIB -Increase heparin to 1600 units/hr -0800 HL -Daily CBC/HL -Monitor for bleeding  Narda Bonds 04/13/2015,11:35 PM

## 2015-04-13 NOTE — Progress Notes (Signed)
Pt complained of chest pain rate of 6/10. Nasal cannula oxygen 2L was placed and EKG was done. Pt already had a 1 in nitro paste on the upper right chest that was put on in ED prior to arrival to the unit. Paged MD M. Lynch and was advised to page MD Hochrein and notify him of the critical troponin lab value and chest pain. MD Hochrein was paged at [phone] and I received a call at [phone]. Hochrein made aware of pt's chest pain, troponin lab value and EKG results.  Hochrein ordered 2mg  Morphine and IV nitro. Awaiting for meds to give to patient.

## 2015-04-13 NOTE — Progress Notes (Signed)
Second troponin reviewed and was elevated at 3.98.   Patient is currently pain-free after being started on IV nitroglycerin and heparin.  Have ordered morphine when necessary.  Continue current care, since he is pain-free, he can eat now.  Have written precath orders for Monday, will put on board.  Rosaria Ferries, PA-C 04/13/2015 11:12 AM Beeper [phone]

## 2015-04-13 NOTE — Progress Notes (Signed)
Nitro paste removed and cleaned from pt's chest and 2mg  morphine given IV. Pt does not complain of worsening chest pain and still rate pain 6/10. Will continue to monitor.   Alexia Freestone, RN

## 2015-04-14 DIAGNOSIS — I214 Non-ST elevation (NSTEMI) myocardial infarction: Secondary | ICD-10-CM

## 2015-04-14 LAB — HEPARIN LEVEL (UNFRACTIONATED)
HEPARIN UNFRACTIONATED: 0.55 [IU]/mL (ref 0.30–0.70)
Heparin Unfractionated: 0.67 IU/mL (ref 0.30–0.70)

## 2015-04-14 LAB — T4, FREE: FREE T4: 0.94 ng/dL (ref 0.61–1.12)

## 2015-04-14 MED ORDER — SODIUM CHLORIDE 0.9 % WEIGHT BASED INFUSION
1.0000 mL/kg/h | INTRAVENOUS | Status: DC
  Administered 2015-04-15: 1 mL/kg/h via INTRAVENOUS

## 2015-04-14 MED ORDER — SODIUM CHLORIDE 0.9 % IV SOLN: 250.0000 mL | INTRAVENOUS | Status: DC | PRN

## 2015-04-14 MED ORDER — SODIUM CHLORIDE 0.9 % IJ SOLN: 3.0000 mL | Freq: Two times a day (BID) | INTRAMUSCULAR | Status: DC

## 2015-04-14 MED ORDER — SODIUM CHLORIDE 0.9 % IJ SOLN: 3.0000 mL | INTRAMUSCULAR | Status: DC | PRN

## 2015-04-14 MED ORDER — ASPIRIN 81 MG PO CHEW
81.0000 mg | CHEWABLE_TABLET | ORAL | Status: AC
  Administered 2015-04-15: 81 mg via ORAL
  Filled 2015-04-14 (×2): qty 1

## 2015-04-14 MED ORDER — MORPHINE SULFATE 2 MG/ML IJ SOLN
2.0000 mg | INTRAMUSCULAR | Status: DC | PRN
  Administered 2015-04-14 (×4): 2 mg via INTRAVENOUS
  Administered 2015-04-15 (×3): 4 mg via INTRAVENOUS
  Administered 2015-04-15: 2 mg via INTRAVENOUS
  Administered 2015-04-15: 4 mg via INTRAVENOUS
  Filled 2015-04-14: qty 1
  Filled 2015-04-14 (×2): qty 2
  Filled 2015-04-14: qty 1
  Filled 2015-04-14: qty 2
  Filled 2015-04-14 (×3): qty 1
  Filled 2015-04-14: qty 2

## 2015-04-14 MED ORDER — SODIUM CHLORIDE 0.9 % WEIGHT BASED INFUSION
3.0000 mL/kg/h | INTRAVENOUS | Status: DC
  Administered 2015-04-15: 3 mL/kg/h via INTRAVENOUS

## 2015-04-14 MED ORDER — ASPIRIN 81 MG PO CHEW: 81.0000 mg | CHEWABLE_TABLET | ORAL | Status: DC

## 2015-04-14 NOTE — Progress Notes (Addendum)
ANTICOAGULATION CONSULT NOTE - Follow Up Consult  Pharmacy Consult for heparin Indication: chest pain/ACS  No Known Allergies  Patient Measurements: Height: 6' (182.9 cm) Weight: 199 lb 4.8 oz (90.402 kg) IBW/kg (Calculated) : 77.6 Heparin Dosing Weight:   Vital Signs: Temp: 98.5 F (36.9 C) (07/10 1428) Temp Source: Oral (07/10 1428) BP: 145/88 mmHg (07/10 1428) Pulse Rate: 64 (07/10 1428)  Labs:  Recent Labs  04/13/15 0012 04/13/15 0355 04/13/15 1053  04/13/15 1530 04/13/15 2034 04/13/15 2252 04/14/15 0800 04/14/15 1601  HGB 13.3 13.0  --   --   --   --   --   --   --   HCT 38.2* 37.4*  --   --   --   --   --   --   --   PLT 149* 159  --   --   --   --   --   --   --   APTT 30  --   --   --   --   --   --   --   --   LABPROT 17.4*  --   --   --   --   --   --   --   --   INR 1.41  --   --   --   --   --   --   --   --   HEPARINUNFRC  --   --   --   < > 0.13*  --  0.13* 0.67 0.55  CREATININE 0.94 1.06  --   --   --   --   --   --   --   TROPONINI 0.06* 3.98* 7.72*  --  3.97* 2.79*  --   --   --   < > = values in this interval not displayed.  Estimated Creatinine Clearance: 67.1 mL/min (by C-G formula based on Cr of 1.06).   Medications:  Scheduled:  . aspirin EC  81 mg Oral Daily  . atorvastatin  80 mg Oral q1800  . clopidogrel  75 mg Oral Daily  . lisinopril  5 mg Oral Daily  . metoprolol tartrate  75 mg Oral BID  . pantoprazole  40 mg Oral Daily   Infusions:  . heparin 1,600 Units/hr (04/14/15 0542)  . nitroGLYCERIN 20 mcg/min (04/14/15 1458)    Assessment: 74 yo male with NSTEMI is currently on therapeutic heparin. Heparin level is 0.55 Goal of Therapy:  Heparin level 0.3-0.7 units/ml Monitor platelets by anticoagulation protocol: Yes   Plan:  - continue heparin at 1600 units/hr - heparin level in am  Jakyia Gaccione, Tsz-Yin 04/14/2015,4:26 PM

## 2015-04-14 NOTE — Progress Notes (Signed)
Patient ID: Nathan Frank, male   DOB: [birth year], 74 y.o.   MRN: [id number]    Patient Name: Nathan Frank Date of Encounter: 04/14/2015     Active Problems:   Unstable angina    SUBJECTIVE  No chest pain or sob.   CURRENT MEDS . aspirin EC  81 mg Oral Daily  . atorvastatin  80 mg Oral q1800  . clopidogrel  75 mg Oral Daily  . lisinopril  5 mg Oral Daily  . metoprolol tartrate  75 mg Oral BID  . pantoprazole  40 mg Oral Daily    OBJECTIVE  Filed Vitals:   04/13/15 1604 04/13/15 1825 04/13/15 2023 04/14/15 0420  BP: 121/51 120/51 145/85 126/85  Pulse: 82 89 63 59  Temp: 97.8 F (36.6 C) 98.3 F (36.8 C) 98.5 F (36.9 C) 98.2 F (36.8 C)  TempSrc: Oral Oral Oral Oral  Resp: 16 16 18 18   Height:      Weight:      SpO2: 100% 100% 100% 100%    Intake/Output Summary (Last 24 hours) at 04/14/15 1137 Last data filed at 04/14/15 0754  Gross per 24 hour  Intake    360 ml  Output    225 ml  Net    135 ml   Filed Weights   04/12/15 2306 04/13/15 0319  Weight: 185 lb (83.915 kg) 199 lb 4.8 oz (90.402 kg)    PHYSICAL EXAM  General: Pleasant, 74 yo man, NAD. Neuro: Alert and oriented X 3. Moves all extremities spontaneously. Psych: Normal affect. HEENT:  Normal  Neck: Supple without bruits or JVD. Lungs:  Resp regular and unlabored, CTA. Heart: RRR no s3, s4, or murmurs. Abdomen: Soft, non-tender, non-distended, BS + x 4.  Extremities: No clubbing, cyanosis or edema. DP/PT/Radials 2+ and equal bilaterally.  Accessory Clinical Findings  CBC  Recent Labs  04/13/15 0012 04/13/15 0355  WBC 4.7 5.5  HGB 13.3 13.0  HCT 38.2* 37.4*  MCV 90.7 91.0  PLT 149* 756   Basic Metabolic Panel  Recent Labs  04/13/15 0012 04/13/15 0355  NA 141 140  K 3.6 3.9  CL 110 107  CO2 21* 24  GLUCOSE 123* 114*  BUN 11 9  CREATININE 0.94 1.06  CALCIUM 8.5* 8.2*   Liver Function Tests  Recent Labs  04/13/15 2034  AST 71*  ALT 29  ALKPHOS 74  BILITOT 1.0    PROT 6.7  ALBUMIN 2.8*   No results for input(s): LIPASE, AMYLASE in the last 72 hours. Cardiac Enzymes  Recent Labs  04/13/15 1053 04/13/15 1530 04/13/15 2034  TROPONINI 7.72* 3.97* 2.79*   BNP Invalid input(s): POCBNP D-Dimer No results for input(s): DDIMER in the last 72 hours. Hemoglobin A1C No results for input(s): HGBA1C in the last 72 hours. Fasting Lipid Panel No results for input(s): CHOL, HDL, LDLCALC, TRIG, CHOLHDL, LDLDIRECT in the last 72 hours. Thyroid Function Tests  Recent Labs  04/13/15 0355  TSH 6.051*    TELE  nsr  Radiology/Studies  Dg Chest 2 View  04/12/2015   CLINICAL DATA:  Left-sided chest pain radiating down the left arm  EXAM: CHEST  2 VIEW  COMPARISON:  02/20/2014  FINDINGS: Trace bilateral pleural effusion. No edema or pneumonia. Hyperinflation.  Chronic mild cardiomegaly.  Stable aortic and hilar contours.  IMPRESSION: 1. Trace pleural effusions. 2. Hyperinflation, likely from COPD.   Electronically Signed   By: Monte Fantasia M.D.   On: 04/12/2015 23:45    ASSESSMENT AND  PLAN  1. NSTEMI 2. HTN Rec: will plan left heart cath tomorrow. Orders written  Gregg Taylor,M.D.  04/14/2015 11:37 AM

## 2015-04-14 NOTE — Progress Notes (Signed)
ANTICOAGULATION CONSULT NOTE - Follow Up Consult  Pharmacy Consult for heparin Indication: chest pain/ACS  No Known Allergies  Patient Measurements: Height: 6' (182.9 cm) Weight: 199 lb 4.8 oz (90.402 kg) IBW/kg (Calculated) : 77.6   Vital Signs: Temp: 98.2 F (36.8 C) (07/10 0420) Temp Source: Oral (07/10 0420) BP: 126/85 mmHg (07/10 0420) Pulse Rate: 59 (07/10 0420)  Labs:  Recent Labs  04/13/15 0012 04/13/15 0355 04/13/15 1053 04/13/15 1530 04/13/15 2034 04/13/15 2252 04/14/15 0800  HGB 13.3 13.0  --   --   --   --   --   HCT 38.2* 37.4*  --   --   --   --   --   PLT 149* 159  --   --   --   --   --   APTT 30  --   --   --   --   --   --   LABPROT 17.4*  --   --   --   --   --   --   INR 1.41  --   --   --   --   --   --   HEPARINUNFRC  --   --   --  0.13*  --  0.13* 0.67  CREATININE 0.94 1.06  --   --   --   --   --   TROPONINI 0.06* 3.98* 7.72* 3.97* 2.79*  --   --     Estimated Creatinine Clearance: 67.1 mL/min (by C-G formula based on Cr of 1.06).   Medications:  Scheduled:  . aspirin EC  81 mg Oral Daily  . atorvastatin  80 mg Oral q1800  . clopidogrel  75 mg Oral Daily  . lisinopril  5 mg Oral Daily  . metoprolol tartrate  75 mg Oral BID  . pantoprazole  40 mg Oral Daily   Infusions:  . heparin 1,600 Units/hr (04/14/15 0542)  . nitroGLYCERIN 10 mcg/min (04/13/15 1749)    Assessment: 74 yo male with NSTEMI on heparin and noted at goal (HL= 0.67) after increase to 1350 units/hr. Plans noted for cath in am.  Goal of Therapy:  Heparin level 0.3-0.7 units/ml Monitor platelets by anticoagulation protocol: Yes   Plan:  -No heparin changes needed -Will recheck a heparin level later today -Daily heparin level and CBC  Hildred Laser, Pharm D 04/14/2015 9:26 AM

## 2015-04-14 NOTE — Progress Notes (Addendum)
Change in patient status.  Patient is having chest pain at a 4 out of 10 pain level without activity.  Pain level is increasing.  No additional signs and symptoms.  Nitro drip was increased to 4.5 and vitals taken.  BP elevated 145/88 and all other vitals within normal range.  Normal sinus rhythm on the monitor and a HR of 64.  Rosaria Ferries, PA-C notified.  Orders placed to  Increase nitro to 6.0, check EKG and give morphine.

## 2015-04-15 ENCOUNTER — Encounter (HOSPITAL_COMMUNITY)
Admission: EM | Disposition: A | Discharge status: Home / Self Care | Payer: Medicare Other | Source: Home / Self Care | Attending: Cardiology

## 2015-04-15 HISTORY — PX: CARDIAC CATHETERIZATION: SHX172

## 2015-04-15 LAB — CBC
HCT: 34.6 % — ABNORMAL LOW (ref 39.0–52.0)
HEMATOCRIT: 35 % — AB (ref 39.0–52.0)
Hemoglobin: 11.8 g/dL — ABNORMAL LOW (ref 13.0–17.0)
Hemoglobin: 12.1 g/dL — ABNORMAL LOW (ref 13.0–17.0)
MCH: 31.4 pg (ref 26.0–34.0)
MCH: 32.1 pg (ref 26.0–34.0)
MCHC: 33.7 g/dL (ref 30.0–36.0)
MCHC: 35 g/dL (ref 30.0–36.0)
MCV: 93.1 fL (ref 78.0–100.0)
MCV: 91.8 fL (ref 78.0–100.0)
PLATELETS: 95 10*3/uL — AB (ref 150–400)
PLATELETS: 99 10*3/uL — AB (ref 150–400)
RBC: 3.76 MIL/uL — ABNORMAL LOW (ref 4.22–5.81)
RBC: 3.77 MIL/uL — ABNORMAL LOW (ref 4.22–5.81)
RDW: 16.2 % — ABNORMAL HIGH (ref 11.5–15.5)
RDW: 16.2 % — ABNORMAL HIGH (ref 11.5–15.5)
WBC: 3.5 10*3/uL — ABNORMAL LOW (ref 4.0–10.5)
WBC: 3.6 10*3/uL — AB (ref 4.0–10.5)

## 2015-04-15 LAB — CREATININE, SERUM
CREATININE: 0.82 mg/dL (ref 0.61–1.24)
GFR calc Af Amer: >60 mL/min (ref >60)
GFR calc non Af Amer: >60 mL/min (ref >60)

## 2015-04-15 SURGERY — LEFT HEART CATH AND CORONARY ANGIOGRAPHY
Anesthesia: LOCAL

## 2015-04-15 MED ORDER — LIDOCAINE HCL (PF) 1 % IJ SOLN
INTRAMUSCULAR | Status: AC
  Filled 2015-04-15: qty 30

## 2015-04-15 MED ORDER — VERAPAMIL HCL 2.5 MG/ML IV SOLN
INTRAVENOUS | Status: AC
  Filled 2015-04-15: qty 2

## 2015-04-15 MED ORDER — MIDAZOLAM HCL 2 MG/2ML IJ SOLN
INTRAMUSCULAR | Status: AC
  Filled 2015-04-15: qty 2

## 2015-04-15 MED ORDER — LIDOCAINE HCL (PF) 1 % IJ SOLN
INTRAMUSCULAR | Status: DC | PRN
  Administered 2015-04-15: 1 mL

## 2015-04-15 MED ORDER — SODIUM CHLORIDE 0.9 % IJ SOLN
3.0000 mL | INTRAMUSCULAR | Status: DC | PRN
  Administered 2015-04-16 (×2): 3 mL via INTRAVENOUS
  Filled 2015-04-15 (×2): qty 3

## 2015-04-15 MED ORDER — HEPARIN (PORCINE) IN NACL 2-0.9 UNIT/ML-% IJ SOLN
INTRAMUSCULAR | Status: AC
  Filled 2015-04-15: qty 1500

## 2015-04-15 MED ORDER — SODIUM CHLORIDE 0.9 % IV SOLN: 250.0000 mL | INTRAVENOUS | Status: DC | PRN

## 2015-04-15 MED ORDER — HEPARIN SODIUM (PORCINE) 5000 UNIT/ML IJ SOLN
5000.0000 [IU] | Freq: Three times a day (TID) | INTRAMUSCULAR | Status: DC
  Administered 2015-04-15 – 2015-04-16 (×2): 5000 [IU] via SUBCUTANEOUS
  Filled 2015-04-15 (×3): qty 1

## 2015-04-15 MED ORDER — SODIUM CHLORIDE 0.9 % WEIGHT BASED INFUSION
1.0000 mL/kg/h | INTRAVENOUS | Status: AC
Start: 2015-04-15 — End: 2015-04-15
  Administered 2015-04-15: 1 mL/kg/h via INTRAVENOUS

## 2015-04-15 MED ORDER — MIDAZOLAM HCL 2 MG/2ML IJ SOLN
INTRAMUSCULAR | Status: DC | PRN
  Administered 2015-04-15: 1 mg via INTRAVENOUS

## 2015-04-15 MED ORDER — FENTANYL CITRATE (PF) 100 MCG/2ML IJ SOLN
INTRAMUSCULAR | Status: DC | PRN
  Administered 2015-04-15: 50 ug via INTRAVENOUS

## 2015-04-15 MED ORDER — HEPARIN SODIUM (PORCINE) 1000 UNIT/ML IJ SOLN
INTRAMUSCULAR | Status: AC
  Filled 2015-04-15: qty 1

## 2015-04-15 MED ORDER — HEPARIN SODIUM (PORCINE) 1000 UNIT/ML IJ SOLN
INTRAMUSCULAR | Status: DC | PRN
  Administered 2015-04-15: 4500 [IU] via INTRAVENOUS

## 2015-04-15 MED ORDER — FENTANYL CITRATE (PF) 100 MCG/2ML IJ SOLN
INTRAMUSCULAR | Status: AC
  Filled 2015-04-15: qty 2

## 2015-04-15 MED ORDER — IOHEXOL 350 MG/ML SOLN
INTRAVENOUS | Status: DC | PRN
  Administered 2015-04-15: 100 mL via INTRAVENOUS

## 2015-04-15 MED ORDER — SODIUM CHLORIDE 0.9 % IJ SOLN: 3.0000 mL | Freq: Two times a day (BID) | INTRAMUSCULAR | Status: DC

## 2015-04-15 MED ORDER — NITROGLYCERIN 1 MG/10 ML FOR IR/CATH LAB
INTRA_ARTERIAL | Status: AC
  Filled 2015-04-15: qty 10

## 2015-04-15 MED ORDER — OXYCODONE-ACETAMINOPHEN 5-325 MG PO TABS: 1.0000 | ORAL_TABLET | ORAL | Status: DC | PRN

## 2015-04-15 MED ORDER — VERAPAMIL HCL 2.5 MG/ML IV SOLN
INTRAVENOUS | Status: DC | PRN
  Administered 2015-04-15: 14:00:00 via INTRA_ARTERIAL

## 2015-04-15 SURGICAL SUPPLY — 13 items
CATH INFINITI 5 FR JL3.5 (CATHETERS) ×2 IMPLANT
CATH INFINITI 5FR ANG PIGTAIL (CATHETERS) ×2 IMPLANT
CATH INFINITI JR4 5F (CATHETERS) ×2 IMPLANT
CATH SITESEER 5F MULTI A 2 (CATHETERS) IMPLANT
DEVICE RAD COMP TR BAND LRG (VASCULAR PRODUCTS) ×2 IMPLANT
GLIDESHEATH SLEND A-KIT 6F 22G (SHEATH) ×2 IMPLANT
KIT HEART LEFT (KITS) ×2 IMPLANT
PACK CARDIAC CATHETERIZATION (CUSTOM PROCEDURE TRAY) ×2 IMPLANT
SHEATH PINNACLE 5F 10CM (SHEATH) IMPLANT
TRANSDUCER W/STOPCOCK (MISCELLANEOUS) ×2 IMPLANT
TUBING CIL FLEX 10 FLL-RA (TUBING) ×2 IMPLANT
WIRE EMERALD 3MM-J .035X150CM (WIRE) IMPLANT
WIRE SAFE-T 1.5MM-J .035X260CM (WIRE) ×2 IMPLANT

## 2015-04-15 NOTE — Progress Notes (Addendum)
ANTICOAGULATION CONSULT NOTE   Pharmacy Consult for heparin Indication: chest pain/ACS  No Known Allergies  Patient Measurements: Height: 6' (182.9 cm) Weight: 199 lb 4.8 oz (90.402 kg) IBW/kg (Calculated) : 77.6 Heparin Dosing Weight: 90kg  Vital Signs: Temp: 98.2 F (36.8 C) (07/11 0450) Temp Source: Oral (07/11 0450) BP: 140/81 mmHg (07/11 0450) Pulse Rate: 63 (07/11 0450)  Labs:  Recent Labs  04/13/15 0012 04/13/15 0355 04/13/15 1053  04/13/15 1530 04/13/15 2034 04/13/15 2252 04/14/15 0800 04/14/15 1601 04/15/15 0407  HGB 13.3 13.0  --   --   --   --   --   --   --  11.8*  HCT 38.2* 37.4*  --   --   --   --   --   --   --  35.0*  PLT 149* 159  --   --   --   --   --   --   --  95*  APTT 30  --   --   --   --   --   --   --   --   --   LABPROT 17.4*  --   --   --   --   --   --   --   --   --   INR 1.41  --   --   --   --   --   --   --   --   --   HEPARINUNFRC  --   --   --   < > 0.13*  --  0.13* 0.67 0.55  --   CREATININE 0.94 1.06  --   --   --   --   --   --   --   --   TROPONINI 0.06* 3.98* 7.72*  --  3.97* 2.79*  --   --   --   --   < > = values in this interval not displayed.  Estimated Creatinine Clearance: 67.1 mL/min (by C-G formula based on Cr of 1.06).   Assessment: 74 yo male with NSTEMI is currently on therapeutic heparin. Heparin level missed this am but at goal yesterday, for cath this afternoon. No bleeding issues noted but hgb and pltc are down this morning  Goal of Therapy:  Heparin level 0.3-0.7 units/ml Monitor platelets by anticoagulation protocol: Yes   Plan:  - continue heparin at 1600 units/hr - Follow up after cath  Erin Hearing PharmD., BCPS Clinical Pharmacist Pager [phone] 04/15/2015 12:00 PM

## 2015-04-15 NOTE — Interval H&P Note (Signed)
Cath Lab Visit (complete for each Cath Lab visit)  Clinical Evaluation Leading to the Procedure:   ACS: Yes.    Non-ACS:    Anginal Classification: CCS III  Anti-ischemic medical therapy: Maximal Therapy (2 or more classes of medications)  Non-Invasive Test Results: No non-invasive testing performed  Prior CABG: No previous CABG      History and Physical Interval Note:  04/15/2015 7:22 AM  Nathan Frank  has presented today for surgery, with the diagnosis of NSTEMI  The various methods of treatment have been discussed with the patient and family. After consideration of risks, benefits and other options for treatment, the patient has consented to  Procedure(s): Left Heart Cath and Coronary Angiography (N/A) as a surgical intervention .  The patient's history has been reviewed, patient examined, no change in status, stable for surgery.  I have reviewed the patient's chart and labs.  Questions were answered to the patient's satisfaction.     Nathan Frank

## 2015-04-15 NOTE — H&P (View-Only) (Signed)
Patient ID: Nathan Frank, male   DOB: 1940/12/16, 74 y.o.   MRN: [id number]    Patient Name: Nathan Frank Date of Encounter: 04/14/2015     Active Problems:   Unstable angina    SUBJECTIVE  No chest pain or sob.   CURRENT MEDS . aspirin EC  81 mg Oral Daily  . atorvastatin  80 mg Oral q1800  . clopidogrel  75 mg Oral Daily  . lisinopril  5 mg Oral Daily  . metoprolol tartrate  75 mg Oral BID  . pantoprazole  40 mg Oral Daily    OBJECTIVE  Filed Vitals:   04/13/15 1604 04/13/15 1825 04/13/15 2023 04/14/15 0420  BP: 121/51 120/51 145/85 126/85  Pulse: 82 89 63 59  Temp: 97.8 F (36.6 C) 98.3 F (36.8 C) 98.5 F (36.9 C) 98.2 F (36.8 C)  TempSrc: Oral Oral Oral Oral  Resp: 16 16 18 18   Height:      Weight:      SpO2: 100% 100% 100% 100%    Intake/Output Summary (Last 24 hours) at 04/14/15 1137 Last data filed at 04/14/15 0754  Gross per 24 hour  Intake    360 ml  Output    225 ml  Net    135 ml   Filed Weights   04/12/15 2306 04/13/15 0319  Weight: 185 lb (83.915 kg) 199 lb 4.8 oz (90.402 kg)    PHYSICAL EXAM  General: Pleasant, 74 yo man, NAD. Neuro: Alert and oriented X 3. Moves all extremities spontaneously. Psych: Normal affect. HEENT:  Normal  Neck: Supple without bruits or JVD. Lungs:  Resp regular and unlabored, CTA. Heart: RRR no s3, s4, or murmurs. Abdomen: Soft, non-tender, non-distended, BS + x 4.  Extremities: No clubbing, cyanosis or edema. DP/PT/Radials 2+ and equal bilaterally.  Accessory Clinical Findings  CBC  Recent Labs  04/13/15 0012 04/13/15 0355  WBC 4.7 5.5  HGB 13.3 13.0  HCT 38.2* 37.4*  MCV 90.7 91.0  PLT 149* 376   Basic Metabolic Panel  Recent Labs  04/13/15 0012 04/13/15 0355  NA 141 140  K 3.6 3.9  CL 110 107  CO2 21* 24  GLUCOSE 123* 114*  BUN 11 9  CREATININE 0.94 1.06  CALCIUM 8.5* 8.2*   Liver Function Tests  Recent Labs  04/13/15 2034  AST 71*  ALT 29  ALKPHOS 74  BILITOT 1.0    PROT 6.7  ALBUMIN 2.8*   No results for input(s): LIPASE, AMYLASE in the last 72 hours. Cardiac Enzymes  Recent Labs  04/13/15 1053 04/13/15 1530 04/13/15 2034  TROPONINI 7.72* 3.97* 2.79*   BNP Invalid input(s): POCBNP D-Dimer No results for input(s): DDIMER in the last 72 hours. Hemoglobin A1C No results for input(s): HGBA1C in the last 72 hours. Fasting Lipid Panel No results for input(s): CHOL, HDL, LDLCALC, TRIG, CHOLHDL, LDLDIRECT in the last 72 hours. Thyroid Function Tests  Recent Labs  04/13/15 0355  TSH 6.051*    TELE  nsr  Radiology/Studies  Dg Chest 2 View  04/12/2015   CLINICAL DATA:  Left-sided chest pain radiating down the left arm  EXAM: CHEST  2 VIEW  COMPARISON:  02/20/2014  FINDINGS: Trace bilateral pleural effusion. No edema or pneumonia. Hyperinflation.  Chronic mild cardiomegaly.  Stable aortic and hilar contours.  IMPRESSION: 1. Trace pleural effusions. 2. Hyperinflation, likely from COPD.   Electronically Signed   By: Monte Fantasia M.D.   On: 04/12/2015 23:45    ASSESSMENT AND  PLAN  1. NSTEMI 2. HTN Rec: will plan left heart cath tomorrow. Orders written  Bronx Brogden,M.D.  04/14/2015 11:37 AM

## 2015-04-15 NOTE — Care Management (Signed)
Important Message  Patient Details  Name: Nathan Frank MRN: [id number] Date of Birth: 1941-08-26   Medicare Important Message Given:  Yes-second notification given    [name] May 04/15/2015, 2:08 PM

## 2015-04-15 NOTE — Progress Notes (Signed)
UR Completed. Naika Noto, RN, BSN.  [phone] 

## 2015-04-15 NOTE — Progress Notes (Signed)
Pt back from cathlab; no c/o pain at this time.  Frequent vs initiated

## 2015-04-16 ENCOUNTER — Telehealth: Payer: Self-pay | Admitting: Interventional Cardiology

## 2015-04-16 ENCOUNTER — Encounter (HOSPITAL_COMMUNITY): Payer: Self-pay | Admitting: Interventional Cardiology

## 2015-04-16 DIAGNOSIS — B182 Chronic viral hepatitis C: Secondary | ICD-10-CM

## 2015-04-16 DIAGNOSIS — D696 Thrombocytopenia, unspecified: Secondary | ICD-10-CM

## 2015-04-16 DIAGNOSIS — I10 Essential (primary) hypertension: Secondary | ICD-10-CM

## 2015-04-16 DIAGNOSIS — R7989 Other specified abnormal findings of blood chemistry: Secondary | ICD-10-CM

## 2015-04-16 MED ORDER — AMLODIPINE BESYLATE 2.5 MG PO TABS
2.5000 mg | ORAL_TABLET | Freq: Every day | ORAL | Status: DC
  Administered 2015-04-16: 2.5 mg via ORAL
  Filled 2015-04-16: qty 1

## 2015-04-16 MED ORDER — LISINOPRIL 5 MG PO TABS
5.0000 mg | ORAL_TABLET | Freq: Every day | ORAL | Status: DC
Start: 2015-04-16 — End: 2015-05-16

## 2015-04-16 MED ORDER — NITROGLYCERIN 0.4 MG SL SUBL: 0.4000 mg | SUBLINGUAL_TABLET | SUBLINGUAL | Status: DC | PRN

## 2015-04-16 MED ORDER — AMLODIPINE BESYLATE 2.5 MG PO TABS
2.5000 mg | ORAL_TABLET | Freq: Every day | ORAL | Status: DC
Start: 2015-04-16 — End: 2015-05-16

## 2015-04-16 MED FILL — Nitroglycerin IV Soln 100 MCG/ML in D5W: INTRA_ARTERIAL | Qty: 10 | Status: AC

## 2015-04-16 MED FILL — Heparin Sodium (Porcine) 2 Unit/ML in Sodium Chloride 0.9%: INTRAMUSCULAR | Qty: 1500 | Status: AC

## 2015-04-16 NOTE — Telephone Encounter (Signed)
TCM per Melina Copa  Appt scheduled with Truitt Merle on 04/24/2015 @ 2p

## 2015-04-16 NOTE — Discharge Summary (Signed)
Discharge Summary   Patient ID: Nathan Frank MRN: [id number], DOB/AGE: 12-15-1940 74 y.o. Admit date: 04/12/2015 D/C date:     04/16/2015  Primary Care Provider: Thressa Sheller, MD Primary Cardiologist: Hochrein  Primary Discharge Diagnoses:  1. NSTEMI possibly due to coronary vasospasm versus embolus or aborted infarct with lysis 2. HTN 3. Hepatitis C with cirrhosis and GIB history 4. H/o polysubstance abuse (h/o EtOH, heroin, THC) 5. Anemia/thrombocytopenia 6. 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 Neuropathy   . Essential hypertension   . Hepatitis C   . 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 Alcohol abuse   . GERD (gastroesophageal reflux disease)   . GI bleed due to NSAIDs 12/13/2014  . Gallstones   . Fatty liver   . Heroin abuse     "I haven't done that since I don't know when."  . Granulomatous gastritis   . Polysubstance abuse     Rare marijuana.  No EtOH x 2 months.   . SVT (supraventricular tachycardia)     a. 12/2014 in setting of GIB, ETOH, NSAIDS, gastritis.  . Prolonged Q-T interval on ECG     a. 12/2014 - treated with magnesium.  . NSTEMI (non-ST elevated myocardial infarction)     a. 04/2015 - patent coronaries. Etiology possibly due to coronary spasm versus embolus, stress cardiomyopathy (atypical), and aborted infarction related to plaque rupture with thrombosis and dissolution. Amlodipine started. Not on antiplatelets due to GIB/cirrhosis history.  . Abnormal TSH   . LV dysfunction     a. 04/2015: EF 45-50% by cath.  . Anemia   . Thrombocytopenia      Hospital Course: Nathan Frank is a 74 y/o M with history of SVT, HTN, hepatitis C, cirrhosis, GIB, alcohol abuse, heroin abuse, marijuana use who presented to [facility] with chest pain. We last saw him in 3/16 when he was admitted with GI bleed related to ETOH, NSAIDs and gastritis and had SVT which was managed medically. He did at that time have  a prolonged QT that was treated with mag. Echo demonstrated an EF of 50% without other acute abnormalities. He presented this admission with chest pain, starting at rest, with both sharp and dull components. EKG in the ER did not show any acute changes but his initial troponin was slightly elevated. The patient was also reporting black diarrhea thus he was initially not started on heparin. However, enzymes began to trend up and he had continued chest pain therefore he was started on IV heparin and NTG gtt. Lisinopril was started for BP control. The plan was for cath on Monday. Peak troponin 7.72. UDS was negative. He also required intermittent morphine and fentanyl. On 7/9 the cardiology fellow loaded the patient on Plavix. He remained CP free through the end of the weekend. He underwent cath 04/15/15 showing widely patent coronaries. The right coronary, LAD, ramus, and circumflex are very tortuous. He had low normal to mildly depressed LV systolic function with an estimated EF of 45-50% with mildly elevated LVEDP. It was felt that his NSTEMI was possibly due to coronary spasm versus embolus, stress cardiomyopathy (atypical), and aborted infarction related to plaque rupture with thrombosis and dissolution. Dr. Harrington Challenger started low dose amlodipine to help with possible spasm, which should also help with somewhat elevated BP this AM. (NTG gave him a headache). BP was 179/94 before AM meds - has since gotten amlodipine, Lasix, and lisinopril so we  anticipate further reduction in BP. Have asked him to follow BP at home and call if running >130/80 (appears to have been labile in the hospital, but yesterday was generally running [id number] systolic). Dr. Harrington Challenger recommended no ASA or Plavix due to cirrhosis and GIB history. His statin was also stopped due to lack of CAD on cath and risks given underlying liver disease. During this admission Hgb went from the 13 range to 11.8, platelets 150s->95. These values were stable on  recheck with Hgb 12.1 and platelets 99. (Prior values in 12/2014 showed Hgb 12.0, platelets 55.) Note baseline PT is 17.4 and INR 1.4. Also relevant this admission was TSH 6.051 with normal free T4. He was instructed to f/u PCP for blood count monitoring and GI issues as well as thyroid dysfunction. He is hemodynamically stable today and is feeling better. Dr. Harrington Challenger has seen and examined the patient today and feels he is stable for discharge.  Discharge Vitals: Blood pressure 158/84, pulse 63, temperature 98.4 F (36.9 C), temperature source Oral, resp. rate 18, height 6' (1.829 m), weight 199 lb 4.8 oz (90.402 kg), SpO2 99 %.  Labs: Lab Results  Component Value Date   WBC 3.6* 04/15/2015   HGB 12.1* 04/15/2015   HCT 34.6* 04/15/2015   MCV 91.8 04/15/2015   PLT 99* 04/15/2015    Recent Labs Lab 04/13/15 0355 04/13/15 2034 04/15/15 1513  NA 140  --   --   K 3.9  --   --   CL 107  --   --   CO2 24  --   --   BUN 9  --   --   CREATININE 1.06  --  0.82  CALCIUM 8.2*  --   --   PROT  --  6.7  --   BILITOT  --  1.0  --   ALKPHOS  --  74  --   ALT  --  29  --   AST  --  71*  --   GLUCOSE 114*  --   --     Recent Labs  04/13/15 1053 04/13/15 1530 04/13/15 2034  TROPONINI 7.72* 3.97* 2.79*    Diagnostic Studies/Procedures   Dg Chest 2 View  04/12/2015   CLINICAL DATA:  Left-sided chest pain radiating down the left arm  EXAM: CHEST  2 VIEW  COMPARISON:  02/20/2014  FINDINGS: Trace bilateral pleural effusion. No edema or pneumonia. Hyperinflation.  Chronic mild cardiomegaly.  Stable aortic and hilar contours.  IMPRESSION: 1. Trace pleural effusions. 2. Hyperinflation, likely from COPD.   Electronically Signed   By: Monte Fantasia M.D.   On: 04/12/2015 23:45    Cardiac catheterization this admission, please see full report and above for summary.   Discharge Medications   Current Discharge Medication List    START taking these medications   Details  amLODipine (NORVASC) 2.5  MG tablet Take 1 tablet (2.5 mg total) by mouth daily. Qty: 30 tablet, Refills: 3    lisinopril (PRINIVIL,ZESTRIL) 5 MG tablet Take 1 tablet (5 mg total) by mouth daily. Qty: 30 tablet, Refills: 3    nitroGLYCERIN (NITROSTAT) 0.4 MG SL tablet Place 1 tablet (0.4 mg total) under the tongue every 5 (five) minutes as needed for chest pain (up to 3 doses). Qty: 25 tablet, Refills: 3      CONTINUE these medications which have NOT CHANGED   Details  metoprolol tartrate (LOPRESSOR) 25 MG tablet Take 3 tablets (75 mg total) by mouth 2 (  two) times daily.     Multiple Vitamin (ONE-A-DAY MENS PO) Take 1 tablet by mouth daily.    omeprazole (PRILOSEC) 40 MG capsule Take 1 capsule (40 mg total) by mouth daily. For acid reflux     traMADol (ULTRAM) 50 MG tablet Take 50 mg by mouth every 6 (six) hours as needed for moderate pain.    traZODone (DESYREL) 100 MG tablet Take 1 tablet (100 mg total) by mouth at bedtime as needed for sleep.         Disposition   The patient will be discharged in stable condition to home. Discharge Instructions    Diet - low sodium heart healthy    Complete by:  As directed      Increase activity slowly    Complete by:  As directed   No driving for 1 week. No lifting over 10 lbs for 2 weeks. No sexual activity for 2 weeks. Keep procedure site clean & dry. If you notice increased pain, swelling, bleeding or pus, call/return!  You may shower, but no soaking baths/hot tubs/pools for 1 week.  Please monitor your blood pressure occasionally at home. Call your doctor if you tend to get readings of greater than 130 on the top number or 80 on the bottom number.          Follow-up Information    Follow up with Thressa Sheller, MD.   Specialty:  Internal Medicine   Why:  Follow up with your primary doctor 1) to discuss further monitoring of your blood and platelet counts and 2) to monitor your thyroid function which was slightly abnormal.   Contact information:   [facility], [place] [address] [phone]       Follow up with Truitt Merle, NP.   Specialties:  Nurse Practitioner, Interventional Cardiology, Cardiology, Radiology   Why:  [place] Office - 04/24/15 at 2pm for cardiology follow-up appointment   Contact information:   [place] [place] [address] [phone]         Duration of Discharge Encounter: Greater than 30 minutes including physician and PA time.  Raechel Ache PA-C 04/16/2015, 9:37 AM

## 2015-04-16 NOTE — Progress Notes (Signed)
   Subjective: Minimal chest discomfort  No SOB   Objective: Filed Vitals:   04/15/15 1700 04/15/15 1838 04/15/15 2145 04/16/15 0626  BP: 144/85  160/90 158/84  Pulse: 64 129 73 63  Temp:   99.5 F (37.5 C) 98.4 F (36.9 C)  TempSrc:   Oral Oral  Resp:   18 18  Height:      Weight:      SpO2: 98%  94% 99%   Weight change:   Intake/Output Summary (Last 24 hours) at 04/16/15 0758 Last data filed at 04/15/15 2151  Gross per 24 hour  Intake 888.95 ml  Output    775 ml  Net 113.95 ml    General: Alert, awake, oriented x3, in no acute distress Neck:  JVP is normal Heart: Regular rate and rhythm, without murmurs, rubs, gallops.  Lungs: Clear to auscultation.  No rales or wheezes. Exemities:  No edema.   Neuro: Grossly intact, nonfocal.  Tele:  SR   Lab Results: Results for orders placed or performed during the hospital encounter of 04/12/15 (from the past 24 hour(s))  CBC     Status: Abnormal   Collection Time: 04/15/15  3:13 PM  Result Value Ref Range   WBC 3.6 (L) 4.0 - 10.5 K/uL   RBC 3.77 (L) 4.22 - 5.81 MIL/uL   Hemoglobin 12.1 (L) 13.0 - 17.0 g/dL   HCT 34.6 (L) 39.0 - 52.0 %   MCV 91.8 78.0 - 100.0 fL   MCH 32.1 26.0 - 34.0 pg   MCHC 35.0 30.0 - 36.0 g/dL   RDW 16.2 (H) 11.5 - 15.5 %   Platelets 99 (L) 150 - 400 K/uL  Creatinine, serum     Status: None   Collection Time: 04/15/15  3:13 PM  Result Value Ref Range   Creatinine, Ser 0.82 0.61 - 1.24 mg/dL   GFR calc non Af Amer >60 >60 mL/min   GFR calc Af Amer >60 >60 mL/min    Studies/Results: No results found.  Medications: Reivewed  @PROBHOSP @  1  NSTEMI  Cath yesterday showed normal coronary arteries  LVEF 45 to 59%  Mild elevated EDP  May be due to spasm, embolus, aborted infarct with lysis   Minimal chest discomfot this am  Will add amlodipine to regimen  2.5 mg   (NTG gave him HA)  This should help with BP as well.  NO ASA with cirrhosis/gi bleed hx.    Ambulate  Poss D/C later this AM with  outpt f/u  .    2  HTN  As noted above  3.  Hep C  Follow counts    LOS: 3 days   [name] 04/16/2015, 7:58 AM

## 2015-04-16 NOTE — Progress Notes (Signed)
Pt ambulating independently in hall. Denies chest pain, dyspnea or dizziness.

## 2015-04-17 ENCOUNTER — Telehealth: Payer: Self-pay | Admitting: *Deleted

## 2015-04-17 NOTE — Telephone Encounter (Signed)
Patient contacted regarding discharge from [facility] on 04/16/2015.  Patient understands to follow up with provider Truitt Merle, PA on 04/24/2015 at 2pm at [facility]. Patient understands discharge instructions?  yes Patient understands medications and regiment? yes Patient understands to bring all medications to this visit? yes

## 2015-04-17 NOTE — Telephone Encounter (Signed)
DR. Hochrein's pt. TCM for 7/13

## 2015-04-17 NOTE — Telephone Encounter (Signed)
Patient contacted regarding discharge from [facility] on 04/16/2015.  Patient understands to follow up with provider Truitt Merle, PA on 04/24/2015 at 2pm at [facility]. Patient understands discharge instructions?  yes Patient understands medications and regiment? yes Patient understands to bring all medications to this visit? yes

## 2015-04-18 DIAGNOSIS — G4700 Insomnia, unspecified: Secondary | ICD-10-CM | POA: Diagnosis not present

## 2015-04-18 DIAGNOSIS — D709 Neutropenia, unspecified: Secondary | ICD-10-CM | POA: Diagnosis not present

## 2015-04-18 DIAGNOSIS — B182 Chronic viral hepatitis C: Secondary | ICD-10-CM | POA: Diagnosis not present

## 2015-04-18 DIAGNOSIS — F1021 Alcohol dependence, in remission: Secondary | ICD-10-CM | POA: Diagnosis not present

## 2015-04-24 ENCOUNTER — Encounter: Payer: Self-pay | Admitting: Nurse Practitioner

## 2015-04-24 ENCOUNTER — Ambulatory Visit (INDEPENDENT_AMBULATORY_CARE_PROVIDER_SITE_OTHER): Payer: Medicare Other | Admitting: Nurse Practitioner

## 2015-04-24 VITALS — BP 134/90 | HR 66 | Ht 72.0 in | Wt 197.1 lb

## 2015-04-24 DIAGNOSIS — I214 Non-ST elevation (NSTEMI) myocardial infarction: Secondary | ICD-10-CM

## 2015-04-24 MED ORDER — COLCHICINE 0.6 MG PO TABS: 0.6000 mg | ORAL_TABLET | Freq: Two times a day (BID) | ORAL | Status: DC | PRN

## 2015-04-24 NOTE — Progress Notes (Signed)
CARDIOLOGY OFFICE NOTE  Date:  04/24/2015    [name] Date of Birth: 01-05-1941 Medical Record #[id number]  PCP:  Thressa Sheller, MD  Cardiologist:  Discharge summary notes Hochrein - but he has never seen - patient identifies Dr. Harrington Challenger as his doctor.   Chief Complaint  Patient presents with  . Coronary Artery Disease    Post hospital - seen for Dr. Percival Spanish    History of Present Illness: Nathan Frank is a 74 y.o. male who presents today for a post hospital visit. Seen for Dr. Percival Spanish. He has a history of SVT, HTN, hepatitis C, cirrhosis, GIB, alcohol abuse, heroin abuse, & marijuana use.   He presented to [facility] earlier this month with chest pain. We last saw him in 3/16 when he was admitted with GI bleed related to ETOH, NSAIDs and gastritis and had SVT which was managed medically. He did at that time have a prolonged QT that was treated with mag. Echo demonstrated an EF of 50% without other acute abnormalities. He presented this admission with chest pain, starting at rest, with both sharp and dull components. EKG in the ER did not show any acute changes but his initial troponin was slightly elevated. The patient was also reporting black diarrhea thus he was initially not started on heparin. However, enzymes began to trend up and he had continued chest pain therefore he was started on IV heparin and NTG gtt. Lisinopril was started for BP control.  Peak troponin 7.72. UDS was negative. He also required intermittent morphine and fentanyl. On 7/9 the cardiology fellow loaded the patient on Plavix. He remained CP free through the end of the weekend. He underwent cath 04/15/15 showing widely patent coronaries. The right coronary, LAD, ramus, and circumflex are very tortuous. He had low normal to mildly depressed LV systolic function with an estimated EF of 45-50% with mildly elevated LVEDP. It was felt that his NSTEMI was possibly due to coronary spasm versus embolus,  stress cardiomyopathy (atypical), and aborted infarction related to plaque rupture with thrombosis and dissolution. He was started low dose amlodipine to help with possible spasm.  Dr. Harrington Challenger recommended no ASA or Plavix due to cirrhosis and GIB history. His statin was also stopped due to lack of CAD on cath and risks given underlying liver disease.   Comes in today. Here alone. Says he is doing good but "the gout is killing me". Can hardly walk because of it. Wanting colchicine. No more chest pain. Breathing is good. Has some swelling - seems to go down overnight - has support stockings - he is thinking about wearing them. Tries to watch his salt. He is happy with how he is doing. Says he "wants to live".   Past Medical History  Diagnosis Date  . Neuropathy   . Essential hypertension   . Hepatitis C   . 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 Alcohol abuse   . GERD (gastroesophageal reflux disease)   . GI bleed due to NSAIDs 12/13/2014  . Gallstones   . Fatty liver   . Heroin abuse     "I haven't done that since I don't know when."  . Granulomatous gastritis   . Polysubstance abuse     Rare marijuana.  No EtOH x 2 months.   . SVT (supraventricular tachycardia)     a. 12/2014 in setting of GIB, ETOH, NSAIDS, gastritis.  . Prolonged Q-T interval on ECG     a. 12/2014 - treated with magnesium.  Marland Kitchen  NSTEMI (non-ST elevated myocardial infarction)     a. 04/2015 - patent coronaries. Etiology possibly due to coronary spasm versus embolus, stress cardiomyopathy (atypical), and aborted infarction related to plaque rupture with thrombosis and dissolution. Amlodipine started. Not on antiplatelets due to GIB/cirrhosis history.  . Abnormal TSH   . LV dysfunction     a. 04/2015: EF 45-50% by cath.  . Anemia   . Thrombocytopenia     Past Surgical History  Procedure Laterality Date  . Esophagogastroduodenoscopy  06/07/2012    Procedure: ESOPHAGOGASTRODUODENOSCOPY (EGD);  Surgeon: Milus Banister, MD;  Location: [place];  Service: Endoscopy;  Laterality: N/A;  may need treatment of varices  . Tonsillectomy    . Esophagogastroduodenoscopy N/A 12/14/2014    Procedure: ESOPHAGOGASTRODUODENOSCOPY (EGD);  Surgeon: Jerene Bears, MD;  Location: [facility] ENDOSCOPY;  Service: Endoscopy;  Laterality: N/A;  . Circumcision    . Cardiac catheterization N/A 04/15/2015    Procedure: Left Heart Cath and Coronary Angiography;  Surgeon: Belva Crome, MD;  Location: [place] CV LAB;  Service: Cardiovascular;  Laterality: N/A;     Medications: Current Outpatient Prescriptions  Medication Sig Dispense Refill  . amLODipine (NORVASC) 2.5 MG tablet Take 1 tablet (2.5 mg total) by mouth daily. 30 tablet 3  . furosemide (LASIX) 20 MG tablet Take 20 mg by mouth daily.  1  . lisinopril (PRINIVIL,ZESTRIL) 5 MG tablet Take 1 tablet (5 mg total) by mouth daily. 30 tablet 3  . metoprolol tartrate (LOPRESSOR) 25 MG tablet Take 3 tablets (75 mg total) by mouth 2 (two) times daily. 180 tablet 2  . Multiple Vitamin (ONE-A-DAY MENS PO) Take 1 tablet by mouth daily.    . nitroGLYCERIN (NITROSTAT) 0.4 MG SL tablet Place 1 tablet (0.4 mg total) under the tongue every 5 (five) minutes as needed for chest pain (up to 3 doses). 25 tablet 3  . omeprazole (PRILOSEC) 40 MG capsule Take 1 capsule (40 mg total) by mouth daily. For acid reflux 60 capsule 2  . traMADol (ULTRAM) 50 MG tablet Take 50 mg by mouth every 6 (six) hours as needed for moderate pain.    Marland Kitchen zolpidem (AMBIEN) 10 MG tablet Take 10 mg by mouth at bedtime.  0   No current facility-administered medications for this visit.    Allergies: No Known Allergies  Social History: The patient  reports that he has quit smoking. His smoking use included Cigarettes. He has a 2.5 pack-year smoking history. He has never used smokeless tobacco. He reports that he drinks about 16.8 oz of alcohol per week. He reports that he uses illicit drugs (Heroin).   Family History: The patient's family  history includes Heart disease in his mother; Prostate cancer in his brother; Stroke in his father.   Review of Systems: Please see the history of present illness.   Otherwise, the review of systems is positive for none.   All other systems are reviewed and negative.   Physical Exam: VS:  BP 134/90 mmHg  Pulse 66  Ht 6' (1.829 m)  Wt 197 lb 1.9 oz (89.413 kg)  BMI 26.73 kg/m2  SpO2 97% .  BMI Body mass index is 26.73 kg/(m^2).  Wt Readings from Last 3 Encounters:  04/24/15 197 lb 1.9 oz (89.413 kg)  04/13/15 199 lb 4.8 oz (90.402 kg)  12/13/14 192 lb 3.9 oz (87.2 kg)   BP is 120/80 by me.   General: Pleasant. Looks chronically ill but in no acute distress.  HEENT:  Normal. Neck: Supple, no JVD, carotid bruits, or masses noted.  Cardiac: Regular rate and rhythm. No murmurs, rubs, or gallops. +1 edema.  Respiratory:  Lungs are clear to auscultation bilaterally with normal work of breathing.  GI: Soft and nontender.  MS: No deformity or atrophy. Gait and ROM intact. Skin: Warm and dry. Color is normal. Lots of bruising.  Neuro:  Strength and sensation are intact and no gross focal deficits noted.  Psych: Alert, appropriate and with normal affect.   LABORATORY DATA:  EKG:  EKG is not ordered today.   Lab Results  Component Value Date   WBC 3.6* 04/15/2015   HGB 12.1* 04/15/2015   HCT 34.6* 04/15/2015   PLT 99* 04/15/2015   GLUCOSE 114* 04/13/2015   ALT 29 04/13/2015   AST 71* 04/13/2015   NA 140 04/13/2015   K 3.9 04/13/2015   CL 107 04/13/2015   CREATININE 0.82 04/15/2015   BUN 9 04/13/2015   CO2 24 04/13/2015   TSH 6.051* 04/13/2015   INR 1.41 04/13/2015    BNP (last 3 results)  Recent Labs  04/13/15 2034  BNP 1029.2*    ProBNP (last 3 results) No results for input(s): PROBNP in the last 8760 hours.   Other Studies Reviewed Today: Cardiac Cath Conclusion     Coronary arteries are widely patent. The right coronary, LAD, ramus, and circumflex are very  tortuous.  Low normal to mildly depressed LV systolic function with an estimated EF of 45-50%. No regional wall motion abnormalities are noted.  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  Elevated myocardial markers for injury raises the possibility of coronary spasm, embolus, stress cardiomyopathy (atypical), and aborted infarction related to plaque rupture with thrombosis and dissolution.  RECOMMENDATION:  Would discontinue IV nitroglycerin.  Further management per primary team.     Assessment/Plan: 1. NSTEMI from vasospasm - would keep on his current regimen.  2. HTN - BP ok by me on current regimen.  3. History of multisubstance abuse  4. Elevated TSH - tells me he used to be on thyroid medicine - followed by PCP  5. Swelling - he will use his support stockings.   Current medicines are reviewed with the patient today.  The patient does not have concerns regarding medicines other than what has been noted above.  The following changes have been made:  See above.  Labs/ tests ordered today include:   No orders of the defined types were placed in this encounter.     Disposition:   FU with Dr. Harrington Challenger in 3 months.   Patient is agreeable to this plan and will call if any problems develop in the interim.   Signed: Burtis Junes, RN, ANP-C 04/24/2015 2:22 PM  [facility] [address] [place], [place]  [address] Phone: [phone] Fax: [phone]

## 2015-04-24 NOTE — Patient Instructions (Addendum)
We will be checking the following labs today - NONE   Medication Instructions:    Continue with your current medicines.   I sent in a RX for Colchicine for you to use twice a day as needed for the gout    Testing/Procedures To Be Arranged:  N/A  Follow-Up:   See Dr. Harrington Challenger in 3 months    Other Special Instructions:   Wear your support stockings.   Call the [place] office at [phone] if you have any questions, problems or concerns.

## 2015-05-16 ENCOUNTER — Other Ambulatory Visit: Payer: Self-pay | Admitting: Physician Assistant

## 2015-05-16 MED ORDER — LISINOPRIL 5 MG PO TABS: 5.0000 mg | ORAL_TABLET | Freq: Every day | ORAL | Status: DC

## 2015-05-16 MED ORDER — AMLODIPINE BESYLATE 2.5 MG PO TABS: 2.5000 mg | ORAL_TABLET | Freq: Every day | ORAL | Status: DC

## 2015-08-26 DIAGNOSIS — I130 Hypertensive heart and chronic kidney disease with heart failure and stage 1 through stage 4 chronic kidney disease, or unspecified chronic kidney disease: Secondary | ICD-10-CM | POA: Diagnosis not present

## 2015-08-26 DIAGNOSIS — E785 Hyperlipidemia, unspecified: Secondary | ICD-10-CM | POA: Diagnosis not present

## 2015-08-26 DIAGNOSIS — F3340 Major depressive disorder, recurrent, in remission, unspecified: Secondary | ICD-10-CM | POA: Diagnosis not present

## 2015-08-26 DIAGNOSIS — Z23 Encounter for immunization: Secondary | ICD-10-CM | POA: Diagnosis not present

## 2015-08-26 DIAGNOSIS — F17211 Nicotine dependence, cigarettes, in remission: Secondary | ICD-10-CM | POA: Diagnosis not present

## 2015-08-26 DIAGNOSIS — E039 Hypothyroidism, unspecified: Secondary | ICD-10-CM | POA: Diagnosis not present

## 2015-09-06 DIAGNOSIS — R9431 Abnormal electrocardiogram [ECG] [EKG]: Secondary | ICD-10-CM | POA: Insufficient documentation

## 2015-09-06 DIAGNOSIS — G629 Polyneuropathy, unspecified: Secondary | ICD-10-CM | POA: Insufficient documentation

## 2015-09-06 DIAGNOSIS — I10 Essential (primary) hypertension: Secondary | ICD-10-CM | POA: Insufficient documentation

## 2015-09-09 ENCOUNTER — Ambulatory Visit: Payer: Medicare Other | Admitting: Internal Medicine

## 2015-09-11 DIAGNOSIS — B182 Chronic viral hepatitis C: Secondary | ICD-10-CM | POA: Diagnosis not present

## 2015-11-24 NOTE — Progress Notes (Signed)
error    This encounter was created in error - please disregard.

## 2015-11-25 ENCOUNTER — Encounter: Payer: Medicare Other | Admitting: Internal Medicine

## 2015-12-02 ENCOUNTER — Encounter (HOSPITAL_COMMUNITY): Payer: Self-pay | Admitting: *Deleted

## 2015-12-02 ENCOUNTER — Emergency Department (HOSPITAL_COMMUNITY): Payer: Medicare Other

## 2015-12-02 ENCOUNTER — Emergency Department (HOSPITAL_COMMUNITY)
Admission: EM | Admit: 2015-12-02 | Discharge: 2015-12-02 | Disposition: A | Discharge status: Home / Self Care | Payer: Medicare Other | Attending: Emergency Medicine | Admitting: Emergency Medicine

## 2015-12-02 DIAGNOSIS — R9431 Abnormal electrocardiogram [ECG] [EKG]: Secondary | ICD-10-CM | POA: Diagnosis present

## 2015-12-02 DIAGNOSIS — F1010 Alcohol abuse, uncomplicated: Secondary | ICD-10-CM | POA: Diagnosis present

## 2015-12-02 DIAGNOSIS — K219 Gastro-esophageal reflux disease without esophagitis: Secondary | ICD-10-CM | POA: Insufficient documentation

## 2015-12-02 DIAGNOSIS — Z87891 Personal history of nicotine dependence: Secondary | ICD-10-CM | POA: Diagnosis not present

## 2015-12-02 DIAGNOSIS — R7989 Other specified abnormal findings of blood chemistry: Secondary | ICD-10-CM | POA: Diagnosis present

## 2015-12-02 DIAGNOSIS — Z862 Personal history of diseases of the blood and blood-forming organs and certain disorders involving the immune mechanism: Secondary | ICD-10-CM | POA: Diagnosis not present

## 2015-12-02 DIAGNOSIS — R000 Tachycardia, unspecified: Secondary | ICD-10-CM | POA: Diagnosis present

## 2015-12-02 DIAGNOSIS — I471 Supraventricular tachycardia, unspecified: Secondary | ICD-10-CM | POA: Diagnosis present

## 2015-12-02 DIAGNOSIS — Z8619 Personal history of other infectious and parasitic diseases: Secondary | ICD-10-CM | POA: Insufficient documentation

## 2015-12-02 DIAGNOSIS — I252 Old myocardial infarction: Secondary | ICD-10-CM | POA: Diagnosis not present

## 2015-12-02 DIAGNOSIS — I10 Essential (primary) hypertension: Secondary | ICD-10-CM | POA: Insufficient documentation

## 2015-12-02 DIAGNOSIS — I4891 Unspecified atrial fibrillation: Secondary | ICD-10-CM | POA: Diagnosis not present

## 2015-12-02 DIAGNOSIS — K7460 Unspecified cirrhosis of liver: Secondary | ICD-10-CM | POA: Diagnosis not present

## 2015-12-02 DIAGNOSIS — Z86718 Personal history of other venous thrombosis and embolism: Secondary | ICD-10-CM | POA: Insufficient documentation

## 2015-12-02 LAB — COMPREHENSIVE METABOLIC PANEL
ALBUMIN: 2.9 g/dL — AB (ref 3.5–5.0)
ALT: 33 U/L (ref 17–63)
AST: 76 U/L — AB (ref 15–41)
Alkaline Phosphatase: 76 U/L (ref 38–126)
Anion gap: 11 (ref 5–15)
BUN: 9 mg/dL (ref 6–20)
CO2: 20 mmol/L — ABNORMAL LOW (ref 22–32)
CREATININE: 0.85 mg/dL (ref 0.61–1.24)
Calcium: 8.2 mg/dL — ABNORMAL LOW (ref 8.9–10.3)
Chloride: 113 mmol/L — ABNORMAL HIGH (ref 101–111)
GFR calc Af Amer: >60 mL/min (ref >60)
Glucose, Bld: 121 mg/dL — ABNORMAL HIGH (ref 65–99)
Potassium: 3.6 mmol/L (ref 3.5–5.1)
Sodium: 144 mmol/L (ref 135–145)
Total Bilirubin: 1.5 mg/dL — ABNORMAL HIGH (ref 0.3–1.2)
Total Protein: 6.8 g/dL (ref 6.5–8.1)

## 2015-12-02 LAB — PROTIME-INR
INR: 1.34 (ref 0.00–1.49)
PROTHROMBIN TIME: 16.7 s — AB (ref 11.6–15.2)

## 2015-12-02 LAB — CBC
HEMATOCRIT: 39.5 % (ref 39.0–52.0)
Hemoglobin: 13.7 g/dL (ref 13.0–17.0)
MCH: 34.6 pg — ABNORMAL HIGH (ref 26.0–34.0)
MCHC: 34.7 g/dL (ref 30.0–36.0)
MCV: 99.7 fL (ref 78.0–100.0)
PLATELETS: 106 10*3/uL — AB (ref 150–400)
RBC: 3.96 MIL/uL — AB (ref 4.22–5.81)
RDW: 16.5 % — ABNORMAL HIGH (ref 11.5–15.5)
WBC: 4.3 10*3/uL (ref 4.0–10.5)

## 2015-12-02 LAB — TSH: TSH: 4.385 u[IU]/mL (ref 0.350–4.500)

## 2015-12-02 LAB — APTT: aPTT: 26 seconds (ref 24–37)

## 2015-12-02 LAB — I-STAT TROPONIN, ED: Troponin i, poc: 0.02 ng/mL (ref 0.00–0.08)

## 2015-12-02 LAB — T4, FREE: FREE T4: 0.69 ng/dL (ref 0.61–1.12)

## 2015-12-02 LAB — MAGNESIUM: Magnesium: 1.3 mg/dL — ABNORMAL LOW (ref 1.7–2.4)

## 2015-12-02 MED ORDER — METOPROLOL TARTRATE 25 MG PO TABS: 75.0000 mg | ORAL_TABLET | Freq: Two times a day (BID) | ORAL | Status: DC

## 2015-12-02 MED ORDER — METOPROLOL TARTRATE 75 MG PO TABS: 75.0000 mg | ORAL_TABLET | Freq: Two times a day (BID) | ORAL | Status: DC

## 2015-12-02 MED ORDER — ADENOSINE 6 MG/2ML IV SOLN
INTRAVENOUS | Status: AC | PRN
  Administered 2015-12-02: 12 mg via INTRAVENOUS
  Administered 2015-12-02: 6 mg via INTRAVENOUS
  Administered 2015-12-02 (×2): 12 mg via INTRAVENOUS

## 2015-12-02 MED ORDER — ADENOSINE 6 MG/2ML IV SOLN
INTRAVENOUS | Status: AC
  Filled 2015-12-02: qty 8

## 2015-12-02 MED ORDER — MAGNESIUM SULFATE 2 GM/50ML IV SOLN
2.0000 g | Freq: Once | INTRAVENOUS | Status: AC
  Administered 2015-12-02: 2 g via INTRAVENOUS
  Filled 2015-12-02: qty 50

## 2015-12-02 MED ORDER — METOPROLOL TARTRATE 1 MG/ML IV SOLN
5.0000 mg | Freq: Once | INTRAVENOUS | Status: AC
  Administered 2015-12-02: 5 mg via INTRAVENOUS
  Filled 2015-12-02: qty 5

## 2015-12-02 MED ORDER — ADENOSINE 6 MG/2ML IV SOLN
INTRAVENOUS | Status: AC
  Filled 2015-12-02: qty 4

## 2015-12-02 MED ORDER — ADENOSINE 6 MG/2ML IV SOLN
INTRAVENOUS | Status: AC
  Filled 2015-12-02: qty 2

## 2015-12-02 MED ORDER — DILTIAZEM HCL 25 MG/5ML IV SOLN
5.0000 mg | INTRAVENOUS | Status: DC | PRN
  Administered 2015-12-02 (×2): 5 mg via INTRAVENOUS
  Filled 2015-12-02: qty 5

## 2015-12-02 MED ORDER — METOPROLOL TARTRATE 1 MG/ML IV SOLN: 2.5000 mg | INTRAVENOUS | Status: DC | PRN

## 2015-12-02 NOTE — ED Provider Notes (Signed)
CSN: [id number]     Arrival date & time 12/02/15  1149 History   First MD Initiated Contact with Patient 12/02/15 1158     Chief Complaint  Patient presents with  . Atrial Fibrillation   Patient is a 75 y.o. male presenting with general illness. The history is provided by the patient. No language interpreter was used.  Illness Quality:  Tachycardia Severity:  Severe Onset quality:  Unable to specify Timing:  Constant Progression:  Unchanged Chronicity:  New Context:  Sent from GI office Relieved by:  None Worsened by:  None Ineffective treatments:  None Associated symptoms: no abdominal pain, no chest pain, no congestion, no cough, no diarrhea, no ear pain, no fever, no headaches, no nausea, no rash, no rhinorrhea, no shortness of breath, no sore throat, no vomiting and no wheezing     Past Medical History  Diagnosis Date  . Neuropathy ([place])   . Essential hypertension   . Hepatitis C   . Cirrhosis ([place]) 06/04/2012  . Alcohol abuse   . GERD (gastroesophageal reflux disease)   . GI bleed due to NSAIDs 12/13/2014  . Gallstones   . Fatty liver   . Heroin abuse     "I haven't done that since I don't know when."  . Granulomatous gastritis   . Polysubstance abuse     Rare marijuana.  No EtOH x 2 months.   . SVT (supraventricular tachycardia) ([place])     a. 12/2014 in setting of GIB, ETOH, NSAIDS, gastritis.  . Prolonged Q-T interval on ECG     a. 12/2014 - treated with magnesium.  . NSTEMI (non-ST elevated myocardial infarction) ([place])     a. 04/2015 - patent coronaries. Etiology possibly due to coronary spasm versus embolus, stress cardiomyopathy (atypical), and aborted infarction related to plaque rupture with thrombosis and dissolution. Amlodipine started. Not on antiplatelets due to GIB/cirrhosis history.  . Abnormal TSH   . LV dysfunction     a. 04/2015: EF 45-50% by cath.  . Anemia   . Thrombocytopenia ([place])   . DVT of lower limb, acute ([place]) 06/06/2012  . Dizziness and giddiness  12/13/2014  . Acute gastric ulcer   . Acute gastritis with hemorrhage   . Alcohol dependence ([place]) 02/02/2014  . Depressive disorder 02/01/2014  . Gastritis 06/07/2012  . Hematuria 06/04/2012  . Heroin overdose 02/20/2014  . Oral thrush 06/05/2012  . Right knee pain 12/13/2014  . S/P alcohol detoxification 02/02/2014  . Symptomatic cholelithiasis 12/15/2013  . Upper GI bleeding 12/13/2014  . Weight loss 06/04/2012   Past Surgical History  Procedure Laterality Date  . Esophagogastroduodenoscopy  06/07/2012    Procedure: ESOPHAGOGASTRODUODENOSCOPY (EGD);  Surgeon: Milus Banister, MD;  Location: [place];  Service: Endoscopy;  Laterality: N/A;  may need treatment of varices  . Tonsillectomy    . Esophagogastroduodenoscopy N/A 12/14/2014    Procedure: ESOPHAGOGASTRODUODENOSCOPY (EGD);  Surgeon: Jerene Bears, MD;  Location: [facility] ENDOSCOPY;  Service: Endoscopy;  Laterality: N/A;  . Circumcision    . Cardiac catheterization N/A 04/15/2015    Procedure: Left Heart Cath and Coronary Angiography;  Surgeon: Belva Crome, MD;  Location: [place] CV LAB;  Service: Cardiovascular;  Laterality: N/A;   Family History  Problem Relation Age of Onset  . Stroke Father   . Prostate cancer Brother   . Heart disease Mother     Pacemaker   Social History  Substance Use Topics  . Smoking status: Former Smoker -- 0.50 packs/day for  5 years    Types: Cigarettes  . Smokeless tobacco: Never Used     Comment: "quit smoking cigarettes in the 1970's"  . Alcohol Use: 16.8 oz/week    28 Glasses of wine per week     Comment: drinks 1 pint of wine/day     Review of Systems  Constitutional: Negative for fever, chills, activity change and appetite change.  HENT: Negative for congestion, dental problem, ear pain, facial swelling, hearing loss, rhinorrhea, sneezing, sore throat, trouble swallowing and voice change.   Eyes: Negative for photophobia, pain, redness and visual disturbance.  Respiratory: Negative for apnea,  cough, chest tightness, shortness of breath, wheezing and stridor.   Cardiovascular: Negative for chest pain, palpitations and leg swelling.  Gastrointestinal: Negative for nausea, vomiting, abdominal pain, diarrhea, constipation, blood in stool and abdominal distention.  Endocrine: Negative for polydipsia and polyuria.  Genitourinary: Negative for frequency, hematuria, flank pain, decreased urine volume and difficulty urinating.  Musculoskeletal: Negative for back pain, joint swelling, gait problem, neck pain and neck stiffness.  Skin: Negative for rash and wound.  Allergic/Immunologic: Negative for immunocompromised state.  Neurological: Negative for dizziness, syncope, facial asymmetry, speech difficulty, weakness, light-headedness, numbness and headaches.  Hematological: Negative for adenopathy.  Psychiatric/Behavioral: Negative for suicidal ideas, behavioral problems, confusion, sleep disturbance and agitation. The patient is not nervous/anxious.   All other systems reviewed and are negative.     Allergies  Review of patient's allergies indicates no known allergies.  Home Medications   Prior to Admission medications   Medication Sig Start Date End Date Taking? Authorizing Provider  nitroGLYCERIN (NITROSTAT) 0.4 MG SL tablet Place 1 tablet (0.4 mg total) under the tongue every 5 (five) minutes as needed for chest pain (up to 3 doses). 04/16/15  Yes Dayna N Dunn, PA-C  amLODipine (NORVASC) 2.5 MG tablet Take 1 tablet (2.5 mg total) by mouth daily. Patient not taking: Reported on 12/02/2015 05/16/15   Dayna N Dunn, PA-C  colchicine 0.6 MG tablet Take 1 tablet (0.6 mg total) by mouth 2 (two) times daily as needed (for gout). Patient not taking: Reported on 12/02/2015 04/24/15   Burtis Junes, NP  lisinopril (PRINIVIL,ZESTRIL) 5 MG tablet Take 1 tablet (5 mg total) by mouth daily. Patient not taking: Reported on 12/02/2015 05/16/15   Dayna N Dunn, PA-C  metoprolol tartrate 75 MG TABS Take 75  mg by mouth 2 (two) times daily. 12/02/15   Vira Blanco, MD  omeprazole (PRILOSEC) 40 MG capsule Take 1 capsule (40 mg total) by mouth daily. For acid reflux Patient not taking: Reported on 12/02/2015 12/15/14   Thurnell Lose, MD   BP 124/82 mmHg  Pulse 81  Temp(Src) 98.4 F (36.9 C) (Oral)  Resp 15  SpO2 99% Physical Exam  Constitutional: He is oriented to person, place, and time. He appears well-developed and well-nourished. No distress.  HENT:  Head: Normocephalic and atraumatic.  Right Ear: External ear normal.  Left Ear: External ear normal.  Eyes: Pupils are equal, round, and reactive to light. Right eye exhibits no discharge. Left eye exhibits no discharge.  Neck: Normal range of motion. No JVD present. No tracheal deviation present.  Cardiovascular: Regular rhythm and normal heart sounds.  Tachycardia present.  Exam reveals no friction rub.   No murmur heard. Pulmonary/Chest: Effort normal and breath sounds normal. No stridor. No respiratory distress. He has no wheezes.  Abdominal: Soft. Bowel sounds are normal. He exhibits no distension. There is no rebound and no guarding.  Musculoskeletal: Normal range of motion. He exhibits no edema or tenderness.  Lymphadenopathy:    He has no cervical adenopathy.  Neurological: He is alert and oriented to person, place, and time. No cranial nerve deficit. Coordination normal.  Skin: Skin is warm and dry. No rash noted. No pallor.  Psychiatric: He has a normal mood and affect. His behavior is normal. Judgment and thought content normal.  Nursing note and vitals reviewed.   ED Course  Procedures (including critical care time) Labs Review Labs Reviewed  CBC - Abnormal; Notable for the following:    RBC 3.96 (*)    MCH 34.6 (*)    RDW 16.5 (*)    Platelets 106 (*)    All other components within normal limits  COMPREHENSIVE METABOLIC PANEL - Abnormal; Notable for the following:    Chloride 113 (*)    CO2 20 (*)    Glucose, Bld  121 (*)    Calcium 8.2 (*)    Albumin 2.9 (*)    AST 76 (*)    Total Bilirubin 1.5 (*)    All other components within normal limits  PROTIME-INR - Abnormal; Notable for the following:    Prothrombin Time 16.7 (*)    All other components within normal limits  MAGNESIUM - Abnormal; Notable for the following:    Magnesium 1.3 (*)    All other components within normal limits  APTT  T4, FREE  TSH  I-STAT TROPOININ, ED  I-STAT TROPOININ, ED    Imaging Review Dg Chest Portable 1 View  12/02/2015  CLINICAL DATA:  Atrial fibrillation, high heart rate EXAM: PORTABLE CHEST 1 VIEW COMPARISON:  Portable exam 1234 hours compared to 04/12/2015 FINDINGS: External pacing leads in EKG leads present. Normal heart size, mediastinal contours and pulmonary vascularity for technique. Lungs clear. No pleural effusion or pneumothorax. Bones unremarkable. IMPRESSION: No acute abnormalities. Electronically Signed   By: Lavonia Dana M.D.   On: 12/02/2015 13:10   I have personally reviewed and evaluated these images and lab results as part of my medical decision-making.   EKG Interpretation   Date/Time:  Monday December 02 2015 14:15:32 EST Ventricular Rate:  82 PR Interval:  114 QRS Duration: 136 QT Interval:  413 QTC Calculation: 482 R Axis:   -5 Text Interpretation:  Sinus rhythm Borderline short PR interval Right  bundle branch block ST elevation, consider inferior injury Since last  tracing rate slower svt resolved Confirmed by KNAPP  MD-J, JON [id number]) on  12/02/2015 2:19:38 PM      MDM   Final diagnoses:  SVT (supraventricular tachycardia) ([place])    Patient with history of hepatitis C with cirrhosis, sent from GI office for evaluation of tachycardia. Patient 170-180 upon arrival. EKG and monitor consistent with supraventricular tachycardia. Mental status normal, no chest pain, no hypotension.  Attempted vagal maneuvers without success. Adenosine 6, 12, 12 given. IV found to be infiltrated  after last dose given.  I inserted a new IV under ultrasound guidance and attempted adenosine 12 mg again. No change to rate. Lopressor 5 mg given.  Chest x-ray normal, troponin 0.02, CBC unremarkable, CMP unremarkable. Magnesium low 1.3, replaced in ED.  1 L normal saline bolus given, cardiology consulted for evaluation recommendations for SVT resistant to maneuvers, adenosine. No emergent indication for cardioversion prior to cardiology consultation.  Patient given diltiazem by cardiology with improvement in heart rate into the 80s. Repeat EKG shows normal sinus rhythm.  4:21 PM: Discuss with cardiology attending Dr. Tamala Julian  who review case and saw patient. Recommends restarting 75 mg metoprolol twice a day and follow-up with cardiology in early March. Patient has an appointment with Dr. Harrington Challenger on 12/13/15.    Discussed with Dr. Tomi Bamberger.    Vira Blanco, MD 12/02/15 [id number]  Dorie Rank, MD 12/02/15 2127

## 2015-12-02 NOTE — ED Notes (Signed)
Pt reports going to dr office today for chronic hep c, pts only complaint is fatigue but pt was sent here today due to tachycardia.

## 2015-12-02 NOTE — Discharge Instructions (Signed)
It was our pleasure to provide your ER care today - we hope that you feel better.  Take your metoprolol as prescribed by your cardiologist.  Follow up with your cardiologist as planned.  Return to ER if worse, new symptoms, persistent fast heart beat, weak/fainting, fevers, chest pain, trouble breathing, other concern.          Paroxysmal Supraventricular Tachycardia  Paroxysmal supraventricular tachycardia (PSVT) is a type of abnormal heart rhythm. It causes your heart to beat very quickly and then suddenly stop beating so quickly. A normal heart rate is 60-100 beats per minute. During an episode of PSVT, your heart rate may be 150-250 beats per minute. This can make you feel light-headed and short of breath. An episode of PSVT can be frightening. It is usually not dangerous.    The heart has four chambers. All chambers need to work together for the heart to beat effectively. A normal heartbeat usually starts in the right upper chamber of the heart (atrium) when an area (sinoatrial node) puts out an electrical signal that spreads to the other chambers. People with PSVT may have abnormal electrical pathways, or they may have other areas in the upper chambers that send out electrical signals. The result is a very rapid heartbeat.  When your heart beats very quickly, it does not have time to fill completely with blood. When PSVT happens often or it lasts for long periods, it can lead to heart weakness and failure. Most people with PSVT do not have any other heart disease.  CAUSES  Abnormal electrical activity in the heart causes PSVT. It is not known why some people get PSVT and others do not.  RISK FACTORS  You may be more likely to have PSVT if:  You are 42-20 years old.  You are a woman. Other factors that may increase your chances of an attack include:  Stress.  Being tired.  Smoking.  Stimulant drugs.  Alcoholic drinks.  Caffeine.  Pregnancy. SIGNS AND SYMPTOMS  A mild episode  of PSVT may cause no symptoms. If you do have signs and symptoms, they may include:  A pounding heart.  Feeling of skipped heartbeats (palpitations).  Weakness.  Shortness of breath.  Tightness or pain in your chest.  Light-headedness.  Anxiety.  Dizziness.  Sweating.  Nausea.  A fainting spell. DIAGNOSIS  Your health care provider may suspect PSVT if you have symptoms that come and go. The health care provider will do a physical exam. If you are having an episode during the exam, the health care provider may be able to diagnose PSVT by listening to your heart and feeling your pulse. Tests may also be done, including:  An electrical study of your heart (electrocardiogram, or ECG).  A test in which you wear a portable ECG monitor all day (Holter monitor) or for several days (event monitor).  A test that involves taking an image of your heart using sound waves (echocardiogram) to rule out other causes of a fast heart rate. TREATMENT  You may not need treatment if episodes of PSVT do not happen often or if they do not cause symptoms. If PSVT episodes do cause symptoms, your health care provider may first suggest trying a self-treatment called vagus nerve stimulation. The vagus nerve extends down from the brain. It regulates certain body functions. Stimulating this nerve can slow down the heart. Your health care provider can teach you ways to do this. You may need to try a few ways to  find what works best for you. Options include:  Holding your breath and pushing, as though you are having a bowel movement.  Massaging an area on one side of your neck below your jaw.  Bending forward with your head between your legs.  Bending forward with your head between your legs and coughing.  Massaging your eyeballs with your eyes closed. If vagus nerve stimulation does not work, other treatment options include:  Medicines to prevent an attack.  Being treated in the hospital with medicine or electric shock  to stop an attack (cardioversion). This treatment can include:  Getting medicine through an IV line.  Having a small electric shock delivered to your heart. You will be given medicine to make you sleep through this procedure. If you have frequent episodes with symptoms, you may need a procedure to get rid of the faulty areas of your heart (radiofrequency ablation) and end the episodes of PSVT. In this procedure:  A long, thin tube (catheter) is passed through one of your veins into your heart.  Energy directed through the catheter eliminates the areas of your heart that are causing abnormal electric stimulation. HOME CARE INSTRUCTIONS  Take medicines only as directed by your health care provider.  Do not use caffeine in any form if caffeine triggers episodes of PSVT. Otherwise, consume caffeine in moderation. This means no more than a few cups of coffee or the equivalent each day.  Do not drink alcohol if alcohol triggers episodes of PSVT. Otherwise, limit alcohol intake to no more than 1 drink per day for nonpregnant women and 2 drinks per day for men. One drink equals 12 ounces of beer, 5 ounces of wine, or 1 ounces of hard liquor.  Do not use any tobacco products, including cigarettes, chewing tobacco, or electronic cigarettes. If you need help quitting, ask your health care provider.  Try to get at least 7 hours of sleep each night.  Find healthy ways to manage stress.  Perform vagus nerve stimulation as directed by your health care provider.  Maintain a healthy weight.  Get some exercise on most days. Ask your health care provider to suggest some good activities for you. SEEK MEDICAL CARE IF:  You are having episodes of PSVT more often, or they are lasting longer.  Vagus nerve stimulation is no longer helping.  You have new symptoms during an episode. SEEK IMMEDIATE MEDICAL CARE IF:  You have chest pain or trouble breathing.  You have an episode of PSVT that has lasted longer than 20  minutes.  You have passed out from an episode of PSVT. These symptoms may represent a serious problem that is an emergency. Do not wait to see if the symptoms will go away. Get medical help right away. Call your local emergency services (911 in the [place] Do not drive yourself to the hospital.  This information is not intended to replace advice given to you by your health care provider. Make sure you discuss any questions you have with your health care provider.  Document Released: 09/21/2005 Document Revised: 10/12/2014 Document Reviewed: 03/01/2014  Elsevier Interactive Patient Education [employer].

## 2015-12-02 NOTE — Code Documentation (Signed)
Suanne Marker, cardiology PA at bedside.

## 2015-12-02 NOTE — ED Provider Notes (Signed)
Pt presented to the ED for evaluation of tachycardia incidentally noted on a visit to the doctor for his chronic hep c.  Pt denies chest pain or shortness of breath Physical Exam  BP 126/89 mmHg  Pulse 77  Temp(Src) 98.4 F (36.9 C) (Oral)  Resp 22  SpO2 98%  Physical Exam  Constitutional: He appears well-developed and well-nourished. No distress.  HENT:  Head: Normocephalic and atraumatic.  Right Ear: External ear normal.  Left Ear: External ear normal.  Eyes: Conjunctivae are normal. Right eye exhibits no discharge. Left eye exhibits no discharge. No scleral icterus.  Neck: Neck supple. No tracheal deviation present.  Cardiovascular: Tachycardia present.   Pulmonary/Chest: Effort normal. No stridor. No respiratory distress. He exhibits no tenderness.  Bruising noted on the anterior chest wall  Musculoskeletal: He exhibits edema.  Neurological: He is alert. Cranial nerve deficit: no gross deficits.  Skin: Skin is warm and dry. No rash noted.  Psychiatric: He has a normal mood and affect.  Nursing note and vitals reviewed.   ED Course  Procedures CRITICAL CARE Performed by: [id number] Total critical care time: 35 minutes Critical care time was exclusive of separately billable procedures and treating other patients. Critical care was necessary to treat or prevent imminent or life-threatening deterioration. Critical care was time spent personally by me on the following activities: development of treatment plan with patient and/or surrogate as well as nursing, discussions with consultants, evaluation of patient's response to treatment, examination of patient, obtaining history from patient or surrogate, ordering and performing treatments and interventions, ordering and review of laboratory studies, ordering and review of radiographic studies, pulse oximetry and re-evaluation of patient's condition.   Pt was given several doses of adenosine after vagal manuevers with no resolution of  his narrow complex tachycardia.  Remained stable with normal BP and no complaints of CP or SOB.  Given a does of metoprolol and rate decreased to the 130.  Cardiology consulted.  MDM Pt was given a dose of cardizem by cardiology.  Converted back to NSR.   Pt preferred to go home.  Stable for dc by cardiology.  Pt encouraged to take his beta blocker as prescribed.      Dorie Rank, MD 12/02/15 2127

## 2015-12-02 NOTE — ED Provider Notes (Signed)
Signed out by Dr Tomi Bamberger that cardiology was coming to see, and that pt dispo per cardiology team.  Cardiology indicates pt ready for d/c to home - they have arranged follow up plan.  Pt alert, content, vitals normal. No pain, no increased wob.     See cardiology note below:  Patient Information    Patient Name Sex DOB SSN   Nathan Frank, Nathan Frank Male 29-Nov-1940 [id number]    H&P by Lonn Georgia, PA-C at 12/02/2015 1:05 PM    Author: Lonn Georgia, PA-C Service: Cardiology Author Type: Physician Assistant Certified   Filed: [id number] 3:31 PM Note Time: 12/02/2015 1:05 PM Status: Attested   Editor: Lonn Georgia, PA-C (Physician Assistant Certified)     Related Notes: Original Note by Lonn Georgia, PA-C (Physician Assistant Certified) filed at [id number] 1:40 PM   Cosigner: Belva Crome, MD at 12/02/2015 4:10 PM       Attestation signed by Belva Crome, MD at 12/02/2015 4:10 PM  The patient has been seen in conjunction with Rosaria Ferries, PA-C. All aspects of care have been considered and discussed. The patient has been personally interviewed, examined, and all clinical data has been reviewed.   75 year old gentleman with history of SVT and recent coronary angiography demonstrating widely patent vessels. He has mildly depressed LV systolic function with an EF in the 40-50% range. He came to the ER today after being found to have elevated heart rate at another appointment for evaluation of his liver. Other than feeling somewhat sluggish, he states there were no other symptoms despite a heart rate of 170. Should be on 75 mg of metoprolol twice a day for arrhythmia suppression but states that he is not using the medication currently.  Other medical problems include alcohol abuse, prolonged QT syndrome, hepatitis C, cirrhosis, DVT, depressive disorder, prior history of heroin use, and essential hypertension.  Laboratory data base today is unremarkable with creatinine of  1.34, chest x-ray without acute congestion, and postconversion EKG demonstrating right bundle branch block as previously documented in July 2016.  Episode of prolonged SVT in a patient without history of coronary disease. He is converted in the emergency room. IV beta blocker therapy and metoprolol were used to facilitate conversion.  Plan resume metoprolol tartrate 75 mg twice a day and follow-up with Dr. [name] at an already scheduled appointment in early March.       Lajean Saver, MD 12/02/15 [phone]

## 2015-12-02 NOTE — H&P (Signed)
History and Physical   Patient ID: Nathan Frank MRN: [id number], DOB/AGE: July 03, 1941 75 y.o. Date of Encounter: 12/02/2015  Primary Physician: Thressa Sheller, MD Primary Cardiologist: Dr Harrington Challenger  Chief Complaint:  Tachycardia, fatigue  HPI: Nathan Frank is a 75 y.o. male with a history of HTN, cirrhosis, Hep C, SVT, prolonged QT, GERD, GIB 2nd NSAIDs, NSTEMI 2016 w/ cors [place], ?spasm vs embolus. EF 45-50%.  Pt has been tired lately, says he has been lazy. Some increased DOE but cannot say how long that has been going on. No palpitations or awareness of rapid HR. No presyncope or syncope. [place] about a week ago but is clear this was a mechanical fall. No chest pain. Occasional daytime LE edema, but socks help. Is not taking medications consistently.   Today had a medical appointment with the [facility]. They detected a rapid HR and told him to come directly to the ER. In the ER, his HR was 170 at times. He was given Adenosine 6>12>6 with no response. HR did not change at all. Concern for technical problems>> new IV and pt given another 12 mg Adenosine with no response. He has had 5 mg Lopressor. HR had dropped from 168>>130s, gradual decrease.  With HR 130s, pt feels a little better, not as tired. He drinks a pint of wine daily, denies tobacco or drug use.    Past Medical History  Diagnosis Date  . Neuropathy ([place])   . Essential hypertension   . Hepatitis C   . Cirrhosis ([place]) 06/04/2012  . Alcohol abuse   . GERD (gastroesophageal reflux disease)   . GI bleed due to NSAIDs 12/13/2014  . Gallstones   . Fatty liver   . Heroin abuse     "I haven't done that since I don't know when."  . Granulomatous gastritis   . Polysubstance abuse     Rare marijuana.  No EtOH x 2 months.   . SVT (supraventricular tachycardia) ([place])     a. 12/2014 in setting of GIB, ETOH, NSAIDS, gastritis.  . Prolonged Q-T interval on ECG     a. 12/2014 - treated with magnesium.  .  NSTEMI (non-ST elevated myocardial infarction) ([place])     a. 04/2015 - patent coronaries. Etiology possibly due to coronary spasm versus embolus, stress cardiomyopathy (atypical), and aborted infarction related to plaque rupture with thrombosis and dissolution. Amlodipine started. Not on antiplatelets due to GIB/cirrhosis history.  . Abnormal TSH   . LV dysfunction     a. 04/2015: EF 45-50% by cath.  . Anemia   . Thrombocytopenia ([place])   . DVT of lower limb, acute ([place]) 06/06/2012  . Dizziness and giddiness 12/13/2014  . Acute gastric ulcer   . Acute gastritis with hemorrhage   . Alcohol dependence ([place]) 02/02/2014  . Depressive disorder 02/01/2014  . Gastritis 06/07/2012  . Hematuria 06/04/2012  . Heroin overdose 02/20/2014  . Oral thrush 06/05/2012  . Right knee pain 12/13/2014  . S/P alcohol detoxification 02/02/2014  . Symptomatic cholelithiasis 12/15/2013  . Upper GI bleeding 12/13/2014  . Weight loss 06/04/2012    Surgical History:  Past Surgical History  Procedure Laterality Date  . Esophagogastroduodenoscopy  06/07/2012    Procedure: ESOPHAGOGASTRODUODENOSCOPY (EGD);  Surgeon: Milus Banister, MD;  Location: [facility];  Service: Endoscopy;  Laterality: N/A;  may need treatment of varices  . Tonsillectomy    . Esophagogastroduodenoscopy N/A 12/14/2014    Procedure: ESOPHAGOGASTRODUODENOSCOPY (EGD);  Surgeon: Ulice Dash  Everitt Amber, MD;  Location: [facility];  Service: Endoscopy;  Laterality: N/A;  . Circumcision    . Cardiac catheterization N/A 04/15/2015    Procedure: Left Heart Cath and Coronary Angiography;  Surgeon: Belva Crome, MD;  Location: [facility];  Service: Cardiovascular;  Laterality: N/A;     I have reviewed the patient's current medications.  HE IS NOT TAKING ANY RX OR OTC MEDS RIGHT NOW Prior to Admission medications   Medication Sig Start Date End Date Taking? Authorizing Provider  amLODipine (NORVASC) 2.5 MG tablet Take 1 tablet (2.5 mg total) by mouth daily. 05/16/15    Dayna N Dunn, PA-C  colchicine 0.6 MG tablet Take 1 tablet (0.6 mg total) by mouth 2 (two) times daily as needed (for gout). 04/24/15   Burtis Junes, NP  furosemide (LASIX) 20 MG tablet Take 20 mg by mouth daily. 04/18/15   Historical Provider, MD  lisinopril (PRINIVIL,ZESTRIL) 5 MG tablet Take 1 tablet (5 mg total) by mouth daily. 05/16/15   Dayna N Dunn, PA-C  metoprolol tartrate (LOPRESSOR) 25 MG tablet Take 3 tablets (75 mg total) by mouth 2 (two) times daily. 12/15/14   Thurnell Lose, MD  Multiple Vitamin (ONE-A-DAY MENS PO) Take 1 tablet by mouth daily.    Historical Provider, MD  nitroGLYCERIN (NITROSTAT) 0.4 MG SL tablet Place 1 tablet (0.4 mg total) under the tongue every 5 (five) minutes as needed for chest pain (up to 3 doses). 04/16/15   Dayna N Dunn, PA-C  omeprazole (PRILOSEC) 40 MG capsule Take 1 capsule (40 mg total) by mouth daily. For acid reflux 12/15/14   Thurnell Lose, MD  traMADol (ULTRAM) 50 MG tablet Take 50 mg by mouth every 6 (six) hours as needed for moderate pain.    Historical Provider, MD  zolpidem (AMBIEN) 10 MG tablet Take 10 mg by mouth at bedtime. 04/18/15   Historical Provider, MD   Scheduled Meds: . adenosine      . adenosine       Continuous Infusions:   PRN Meds:.  Allergies: No Known Allergies  Social History   Social History  . Marital Status: Married    Spouse Name: N/A  . Number of Children: 3  . Years of Education: N/A   Occupational History  . Retired Education officer, museum    Social History Main Topics  . Smoking status: Former Smoker -- 0.50 packs/day for 5 years    Types: Cigarettes  . Smokeless tobacco: Never Used     Comment: "quit smoking cigarettes in the 1970's"  . Alcohol Use: 16.8 oz/week    28 Glasses of wine per week     Comment: drinks 1 pint of wine/day   . Drug Use: Yes    Special: Heroin  . Sexual Activity: Not Currently    Birth Control/ Protection: Condom   Other Topics Concern  . Not on file   Social History  Narrative   Lost one son to a gunshot.  Lives with wife.      Family History  Problem Relation Age of Onset  . Stroke Father   . Prostate cancer Brother   . Heart disease Mother     Pacemaker   Family Status  Relation Status Death Age  . Father Deceased   . Brother Deceased   . Maternal Grandfather Deceased   . Maternal Grandmother Deceased   . Paternal Grandmother Deceased   . Paternal Grandfather Deceased     Review of  Systems:   Full 14-point review of systems otherwise negative except as noted above.  Physical Exam: Blood pressure 118/86, pulse 138, temperature 98.4 F (36.9 C), temperature source Oral, resp. rate 16, SpO2 97 %. General: Well developed, well nourished,male in no acute distress. Head: Normocephalic, atraumatic, sclera non-icteric, no xanthomas, nares are without discharge. Dentition: poor Neck: No carotid bruits. JVD not elevated. No thyromegally Lungs: Good expansion bilaterally. without wheezes or rhonchi.  Heart: Rapid Regular rate and rhythm with S1 S2.  No S3 or S4.  No murmur, no rubs, or gallops appreciated. Abdomen: Soft, non-tender, non-distended with normoactive bowel sounds. + hepatomegaly. No rebound/guarding. No obvious abdominal masses. Msk:  Strength and tone appear normal for age. No joint deformities or effusions, no spine or costo-vertebral angle tenderness. Extremities: No clubbing or cyanosis. No edema.  Distal pedal pulses are 2+ in 4 extrem Neuro: Alert and oriented X 3. Moves all extremities spontaneously. No focal deficits noted. Psych:  Responds to questions appropriately with a normal affect. Skin: No rashes or lesions noted  Labs:   Lab Results  Component Value Date   WBC 4.3 12/02/2015   HGB 13.7 12/02/2015   HCT 39.5 12/02/2015   MCV 99.7 12/02/2015   PLT 106* 12/02/2015    Recent Labs  12/02/15 1250  INR 1.34     Recent Labs Lab 12/02/15 1250  NA 144  K 3.6  CL 113*  CO2 20*  BUN 9  CREATININE 0.85   CALCIUM 8.2*  PROT 6.8  BILITOT 1.5*  ALKPHOS 76  ALT 33  AST 76*  GLUCOSE 121*   MAGNESIUM  Date Value Ref Range Status  12/02/2015 1.3* 1.7 - 2.4 mg/dL Final    Recent Labs  12/02/15 1301  TROPIPOC 0.02   Lab Results  Component Value Date   TSH 4.385 12/02/2015     Radiology/Studies: Dg Chest Portable 1 View  12/02/2015  CLINICAL DATA:  Atrial fibrillation, high heart rate EXAM: PORTABLE CHEST 1 VIEW COMPARISON:  Portable exam 1234 hours compared to 04/12/2015 FINDINGS: External pacing leads in EKG leads present. Normal heart size, mediastinal contours and pulmonary vascularity for technique. Lungs clear. No pleural effusion or pneumothorax. Bones unremarkable. IMPRESSION: No acute abnormalities. Electronically Signed   By: Lavonia Dana M.D.   On: 12/02/2015 13:10     Cardiac Cath: 04/15/2015  Coronary arteries are widely patent. The right coronary, LAD, ramus, and circumflex are very tortuous.  Low normal to mildly depressed LV systolic function with an estimated EF of 45-50%. No regional wall motion abnormalities are noted.  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  Elevated myocardial markers for injury raises the possibility of coronary spasm, embolus, stress cardiomyopathy (atypical), and aborted infarction related to plaque rupture with thrombosis and dissolution. RECOMMENDATION:  Would discontinue IV nitroglycerin.  Further management per primary team  Echo: 12/14/2014 - Left ventricle: The cavity size was normal. Wall thickness was normal. The estimated ejection fraction was 50%. Diffuse hypokinesis. Features are consistent with a pseudonormal left ventricular filling pattern, with concomitant abnormal relaxation and increased filling pressure (grade 2 diastolic dysfunction). - Aortic valve: There was no stenosis. - Mitral valve: There was trivial  regurgitation. - Left atrium: The atrium was mildly to moderately dilated. - Right ventricle: The cavity size was normal. Systolic function was normal. - Tricuspid valve: Peak RV-RA gradient (S): 32 mm Hg. - Pulmonary arteries: PA peak pressure: 35 mm Hg (S). -  Inferior vena cava: The vessel was normal in size. The respirophasic diameter changes were in the normal range (>= 50%), consistent with normal central venous pressure. Impressions: - Technically difficult study with poor acoustic windows. Normal LV size with EF 50%, diffuse mild hypokinesis. Moderate diastolic dysfunction. Normal RV size and systolic function. No significant valvular abnormalities.  ECG: 12/02/2015 SVT, HR 168 RBBB is old  ASSESSMENT AND PLAN:  Principal Problem:   SVT (supraventricular tachycardia) ([place]) - pt had gradual slowing with Lopressor - Adenosine did not work at all - possibly atrial tach - try Cardizem, which has helped before. - use Lopressor or Cardizem, whichever works the best  Active Problems:   Alcohol abuse - ck LFTs    Abnormal TSH -TSH has been up, with normal Free T4 in the past. - Recheck both    Prolonged Q-T interval on ECG - QTc was 525 ms 04/14/2015 - it is generally at/over 500   Signed, Rosaria Ferries, PA-C 12/02/2015 3:30 PM Beeper [id number]  ADDENDUM: Pt rec'd 5 mg IV Lopressor, HR not much slower. MgSO4 2 Gm started IV. Pt given Cardizem 5 mg x 2 doses. After the 2nd dose of Cardizem, pt converted to SR, HR 70s. Currently resting comfortably. Lenoard Aden 12/02/2015 3:31 PM

## 2015-12-02 NOTE — ED Notes (Signed)
CARDS at bedside

## 2015-12-02 NOTE — ED Notes (Signed)
Dr. Smith, Cardiology at bedside.  

## 2015-12-02 NOTE — ED Notes (Signed)
Adenosine given at 6mg , 12mg , and 12mg  with no response. IV site infiltrated during 3rd dose. Ultrasound IV started by MD and will attempted 12 mg adenosine again.

## 2015-12-06 ENCOUNTER — Other Ambulatory Visit (HOSPITAL_COMMUNITY): Payer: Self-pay | Admitting: Physician Assistant

## 2015-12-06 DIAGNOSIS — B182 Chronic viral hepatitis C: Secondary | ICD-10-CM

## 2015-12-13 ENCOUNTER — Ambulatory Visit (INDEPENDENT_AMBULATORY_CARE_PROVIDER_SITE_OTHER): Payer: Medicare Other | Admitting: Internal Medicine

## 2015-12-13 ENCOUNTER — Encounter: Payer: Self-pay | Admitting: Internal Medicine

## 2015-12-13 VITALS — BP 134/88 | HR 78 | Ht 72.0 in | Wt 214.7 lb

## 2015-12-13 DIAGNOSIS — I214 Non-ST elevation (NSTEMI) myocardial infarction: Secondary | ICD-10-CM

## 2015-12-13 DIAGNOSIS — I471 Supraventricular tachycardia: Secondary | ICD-10-CM

## 2015-12-13 DIAGNOSIS — I10 Essential (primary) hypertension: Secondary | ICD-10-CM | POA: Diagnosis not present

## 2015-12-13 MED ORDER — LISINOPRIL 2.5 MG PO TABS: 2.5000 mg | ORAL_TABLET | Freq: Every day | ORAL | Status: DC

## 2015-12-13 NOTE — Progress Notes (Signed)
Cardiology Office Note   Date:  12/13/2015   ID:  JONPAUL Frank, DOB [birth year], MRN [id number]  PCP:  Thressa Sheller, MD  Cardiologist:   [name], MD    F/U of HTN and SVT     History of Present Illness: Nathan Frank is a 75 y.o. male with a history of SVT, HTN, hep C, cirrhosis, substanse abuse.   He was seen in ER on Feb 27  Was in liver clinic an pulse noted to be fast 170 bpm  Was not taking hs metoprolol  He denied palpitations.  [place] a little tired that day  In ER given IV b blocker  And po restarted for SVT    Tired occasionally  Doesn't take pulse  Has new BP maching   Feeling pretty good  No CP  Breathing is OK    Outpatient Prescriptions Prior to Visit  Medication Sig Dispense Refill  . metoprolol tartrate 75 MG TABS Take 75 mg by mouth 2 (two) times daily. 28 tablet 0  . omeprazole (PRILOSEC) 40 MG capsule Take 1 capsule (40 mg total) by mouth daily. For acid reflux 60 capsule 2  . amLODipine (NORVASC) 2.5 MG tablet Take 1 tablet (2.5 mg total) by mouth daily. (Patient not taking: Reported on 12/02/2015) 90 tablet 0  . colchicine 0.6 MG tablet Take 1 tablet (0.6 mg total) by mouth 2 (two) times daily as needed (for gout). (Patient not taking: Reported on 12/02/2015) 30 tablet 0  . lisinopril (PRINIVIL,ZESTRIL) 5 MG tablet Take 1 tablet (5 mg total) by mouth daily. (Patient not taking: Reported on 12/02/2015) 90 tablet 0  . nitroGLYCERIN (NITROSTAT) 0.4 MG SL tablet Place 1 tablet (0.4 mg total) under the tongue every 5 (five) minutes as needed for chest pain (up to 3 doses). (Patient not taking: Reported on 12/13/2015) 25 tablet 3   No facility-administered medications prior to visit.     Allergies:   Review of patient's allergies indicates no known allergies.   Past Medical History  Diagnosis Date  . Neuropathy ([place])   . Essential hypertension   . Hepatitis C   . Cirrhosis ([place]) 06/04/2012  . Alcohol abuse   . GERD (gastroesophageal reflux disease)   .  GI bleed due to NSAIDs 12/13/2014  . Gallstones   . Fatty liver   . Heroin abuse     "I haven't done that since I don't know when."  . Granulomatous gastritis   . Polysubstance abuse     Rare marijuana.  No EtOH x 2 months.   . SVT (supraventricular tachycardia) ([place])     a. 12/2014 in setting of GIB, ETOH, NSAIDS, gastritis.  . Prolonged Q-T interval on ECG     a. 12/2014 - treated with magnesium.  . NSTEMI (non-ST elevated myocardial infarction) ([place])     a. 04/2015 - patent coronaries. Etiology possibly due to coronary spasm versus embolus, stress cardiomyopathy (atypical), and aborted infarction related to plaque rupture with thrombosis and dissolution. Amlodipine started. Not on antiplatelets due to GIB/cirrhosis history.  . Abnormal TSH   . LV dysfunction     a. 04/2015: EF 45-50% by cath.  . Anemia   . Thrombocytopenia ([place])   . DVT of lower limb, acute ([place]) 06/06/2012  . Dizziness and giddiness 12/13/2014  . Acute gastric ulcer   . Acute gastritis with hemorrhage   . Alcohol dependence ([place]) 02/02/2014  . Depressive disorder 02/01/2014  . Gastritis 06/07/2012  . Hematuria 06/04/2012  .  Heroin overdose 02/20/2014  . Oral thrush 06/05/2012  . Right knee pain 12/13/2014  . S/P alcohol detoxification 02/02/2014  . Symptomatic cholelithiasis 12/15/2013  . Upper GI bleeding 12/13/2014  . Weight loss 06/04/2012    Past Surgical History  Procedure Laterality Date  . Esophagogastroduodenoscopy  06/07/2012    Procedure: ESOPHAGOGASTRODUODENOSCOPY (EGD);  Surgeon: Milus Banister, MD;  Location: [facility];  Service: Endoscopy;  Laterality: N/A;  may need treatment of varices  . Tonsillectomy    . Esophagogastroduodenoscopy N/A 12/14/2014    Procedure: ESOPHAGOGASTRODUODENOSCOPY (EGD);  Surgeon: Jerene Bears, MD;  Location: [facility] ENDOSCOPY;  Service: Endoscopy;  Laterality: N/A;  . Circumcision    . Cardiac catheterization N/A 04/15/2015    Procedure: Left Heart Cath and Coronary Angiography;   Surgeon: Belva Crome, MD;  Location: [facility] CV LAB;  Service: Cardiovascular;  Laterality: N/A;     Social History:  The patient  reports that he has quit smoking. His smoking use included Cigarettes. He has a 2.5 pack-year smoking history. He has never used smokeless tobacco. He reports that he drinks about 16.8 oz of alcohol per week. He reports that he uses illicit drugs (Heroin).   Family History:  The patient's family history includes Heart disease in his mother; Prostate cancer in his brother; Stroke in his father.    ROS:  Please see the history of present illness. All other systems are reviewed and  Negative to the above problem except as noted.    PHYSICAL EXAM: VS:  BP 134/88 mmHg  Pulse 78  Ht 6' (1.829 m)  Wt 214 lb 11.2 oz (97.387 kg)  BMI 29.11 kg/m2  GEN: Well nourished, well developed, in no acute distress HEENT: normal Neck: Sl increase in JVP No, carotid bruits, or masses Cardiac: RRR; no murmurs, rubs, or gallops,no edema  Respiratory:  clear to auscultation bilaterally, normal work of breathing GI: Abdomen is slightly distended   MS: no deformity Moving all extremities   Skin: warm and dry, no rash Neuro:  Strength and sensation are intact Psych: euthymic mood, full affect   EKG:  EKG is not  ordered today.   Lipid Panel No results found for: CHOL, TRIG, HDL, CHOLHDL, VLDL, LDLCALC, LDLDIRECT    Wt Readings from Last 3 Encounters:  12/13/15 214 lb 11.2 oz (97.387 kg)  04/24/15 197 lb 1.9 oz (89.413 kg)  04/13/15 199 lb 4.8 oz (90.402 kg)      ASSESSMENT AND PLAN:  1. SVT  Amazing that pt does not sense.  Would keep on metoprolol  COntinue to follow  I owuld recomm an echo to eval LVEF given tachycardai    2.  Chornic systolic CHF  Cath in July 2016 showed normal coronary arteries  LVEF was 45 to 50%  I would restart ACE I 2.5 mg   Volume may be up sl  WIll check labs  May be related to hep C  Repeat echo in June    3.  Hep C     Signed, [name], MD  12/13/2015 6:19 PM    [address], [place], [place]  [id number] Phone: [phone]; Fax: [phone]

## 2015-12-13 NOTE — Patient Instructions (Addendum)
Your physician has recommended you make the following change in your medication:  1.) start lisinopril 2.5 mg once daily  Your physician recommends that you return for lab work today (BMET, BNP)  Your physician has requested that you have an echocardiogram. Echocardiography is a painless test that uses sound waves to create images of your heart. It provides your doctor with information about the size and shape of your heart and how well your heart's chambers and valves are working. This procedure takes approximately one hour. There are no restrictions for this procedure. (schedule same day as Dr. Harrington Challenger appointment)  Your physician recommends that you schedule a follow-up appointment in: June with Dr. Harrington Challenger.  Please have echo same day, before appointment.

## 2015-12-14 LAB — BRAIN NATRIURETIC PEPTIDE: Brain Natriuretic Peptide: 256.6 pg/mL — ABNORMAL HIGH (ref <100)

## 2015-12-14 LAB — BASIC METABOLIC PANEL
BUN: 11 mg/dL (ref 7–25)
CALCIUM: 8.2 mg/dL — AB (ref 8.6–10.3)
CO2: 18 mmol/L — AB (ref 20–31)
CREATININE: 0.72 mg/dL (ref 0.70–1.18)
Chloride: 110 mmol/L (ref 98–110)
Glucose, Bld: 96 mg/dL (ref 65–99)
Potassium: 3.8 mmol/L (ref 3.5–5.3)
Sodium: 141 mmol/L (ref 135–146)

## 2015-12-16 DIAGNOSIS — R188 Other ascites: Secondary | ICD-10-CM | POA: Diagnosis not present

## 2015-12-18 ENCOUNTER — Ambulatory Visit (HOSPITAL_COMMUNITY): Payer: Medicare Other

## 2015-12-24 ENCOUNTER — Telehealth: Payer: Self-pay

## 2015-12-24 MED ORDER — SPIRONOLACTONE 25 MG PO TABS: 25.0000 mg | ORAL_TABLET | Freq: Every day | ORAL | Status: DC

## 2015-12-24 NOTE — Telephone Encounter (Signed)
-----   Message from Fay Records, MD sent at 12/17/2015  2:25 PM EDT ----- Fluid is up mildly Could try 25 mg aldactone.  Check BMET and BNP in 1 week

## 2015-12-24 NOTE — Telephone Encounter (Signed)
Informed patient of results and verbal understanding expressed.  Aldactone 25 mg daily called in to [facility] [address] Patient to have BMET and BNP drawn at Dr. Doristine Devoid office 3/28 - order faxed to [phone]. Patient agrees with treatment plan.

## 2015-12-31 DIAGNOSIS — B182 Chronic viral hepatitis C: Secondary | ICD-10-CM | POA: Diagnosis not present

## 2015-12-31 DIAGNOSIS — K7011 Alcoholic hepatitis with ascites: Secondary | ICD-10-CM | POA: Diagnosis not present

## 2015-12-31 DIAGNOSIS — I509 Heart failure, unspecified: Secondary | ICD-10-CM | POA: Diagnosis not present

## 2015-12-31 DIAGNOSIS — N182 Chronic kidney disease, stage 2 (mild): Secondary | ICD-10-CM | POA: Diagnosis not present

## 2015-12-31 DIAGNOSIS — I2510 Atherosclerotic heart disease of native coronary artery without angina pectoris: Secondary | ICD-10-CM | POA: Diagnosis not present

## 2015-12-31 LAB — PROTIME-INR: INR: 1.1 (ref 0.9–1.1)

## 2016-01-01 ENCOUNTER — Encounter: Payer: Self-pay | Admitting: Internal Medicine

## 2016-01-07 ENCOUNTER — Ambulatory Visit (HOSPITAL_COMMUNITY)
Admission: RE | Admit: 2016-01-07 | Discharge: 2016-01-07 | Disposition: A | Discharge status: Home / Self Care | Payer: Medicare Other | Source: Ambulatory Visit | Attending: Physician Assistant | Admitting: Physician Assistant

## 2016-01-07 DIAGNOSIS — K8020 Calculus of gallbladder without cholecystitis without obstruction: Secondary | ICD-10-CM | POA: Insufficient documentation

## 2016-01-07 DIAGNOSIS — K7460 Unspecified cirrhosis of liver: Secondary | ICD-10-CM | POA: Diagnosis not present

## 2016-01-07 DIAGNOSIS — B182 Chronic viral hepatitis C: Secondary | ICD-10-CM | POA: Diagnosis not present

## 2016-01-22 DIAGNOSIS — K7011 Alcoholic hepatitis with ascites: Secondary | ICD-10-CM | POA: Diagnosis not present

## 2016-01-22 DIAGNOSIS — M545 Low back pain: Secondary | ICD-10-CM | POA: Diagnosis not present

## 2016-01-23 DIAGNOSIS — K7460 Unspecified cirrhosis of liver: Secondary | ICD-10-CM | POA: Diagnosis not present

## 2016-02-18 DIAGNOSIS — Z1389 Encounter for screening for other disorder: Secondary | ICD-10-CM | POA: Diagnosis not present

## 2016-02-18 DIAGNOSIS — Z0000 Encounter for general adult medical examination without abnormal findings: Secondary | ICD-10-CM | POA: Diagnosis not present

## 2016-02-18 DIAGNOSIS — Z23 Encounter for immunization: Secondary | ICD-10-CM | POA: Diagnosis not present

## 2016-02-20 ENCOUNTER — Encounter: Payer: Self-pay | Admitting: Gastroenterology

## 2016-02-25 DIAGNOSIS — F17211 Nicotine dependence, cigarettes, in remission: Secondary | ICD-10-CM | POA: Diagnosis not present

## 2016-02-25 DIAGNOSIS — Z125 Encounter for screening for malignant neoplasm of prostate: Secondary | ICD-10-CM | POA: Diagnosis not present

## 2016-02-25 DIAGNOSIS — I509 Heart failure, unspecified: Secondary | ICD-10-CM | POA: Diagnosis not present

## 2016-02-25 DIAGNOSIS — D709 Neutropenia, unspecified: Secondary | ICD-10-CM | POA: Diagnosis not present

## 2016-03-13 ENCOUNTER — Ambulatory Visit: Payer: Self-pay | Admitting: Internal Medicine

## 2016-03-13 ENCOUNTER — Other Ambulatory Visit: Payer: Self-pay | Admitting: Physician Assistant

## 2016-03-13 ENCOUNTER — Other Ambulatory Visit (HOSPITAL_COMMUNITY): Payer: Self-pay

## 2016-03-18 ENCOUNTER — Encounter: Payer: Self-pay | Admitting: Internal Medicine

## 2016-03-31 ENCOUNTER — Other Ambulatory Visit: Payer: Self-pay | Admitting: Physician Assistant

## 2016-04-18 ENCOUNTER — Encounter (HOSPITAL_COMMUNITY): Payer: Self-pay

## 2016-04-18 ENCOUNTER — Inpatient Hospital Stay (HOSPITAL_COMMUNITY)
Admission: EM | Admit: 2016-04-18 | Discharge: 2016-04-22 | DRG: 309 | Disposition: A | Discharge status: Home / Self Care | Payer: Medicare Other | Attending: Internal Medicine | Admitting: Internal Medicine

## 2016-04-18 ENCOUNTER — Emergency Department (HOSPITAL_COMMUNITY): Payer: Medicare Other

## 2016-04-18 ENCOUNTER — Inpatient Hospital Stay (HOSPITAL_COMMUNITY): Payer: Medicare Other

## 2016-04-18 DIAGNOSIS — Z8042 Family history of malignant neoplasm of prostate: Secondary | ICD-10-CM | POA: Diagnosis not present

## 2016-04-18 DIAGNOSIS — R079 Chest pain, unspecified: Secondary | ICD-10-CM

## 2016-04-18 DIAGNOSIS — I252 Old myocardial infarction: Secondary | ICD-10-CM

## 2016-04-18 DIAGNOSIS — E872 Acidosis, unspecified: Secondary | ICD-10-CM

## 2016-04-18 DIAGNOSIS — I471 Supraventricular tachycardia: Principal | ICD-10-CM | POA: Diagnosis present

## 2016-04-18 DIAGNOSIS — K7030 Alcoholic cirrhosis of liver without ascites: Secondary | ICD-10-CM | POA: Diagnosis present

## 2016-04-18 DIAGNOSIS — I4581 Long QT syndrome: Secondary | ICD-10-CM

## 2016-04-18 DIAGNOSIS — K766 Portal hypertension: Secondary | ICD-10-CM | POA: Diagnosis present

## 2016-04-18 DIAGNOSIS — B1920 Unspecified viral hepatitis C without hepatic coma: Secondary | ICD-10-CM | POA: Diagnosis present

## 2016-04-18 DIAGNOSIS — K219 Gastro-esophageal reflux disease without esophagitis: Secondary | ICD-10-CM | POA: Diagnosis not present

## 2016-04-18 DIAGNOSIS — Z8711 Personal history of peptic ulcer disease: Secondary | ICD-10-CM

## 2016-04-18 DIAGNOSIS — G629 Polyneuropathy, unspecified: Secondary | ICD-10-CM | POA: Diagnosis present

## 2016-04-18 DIAGNOSIS — I110 Hypertensive heart disease with heart failure: Secondary | ICD-10-CM | POA: Diagnosis not present

## 2016-04-18 DIAGNOSIS — K7460 Unspecified cirrhosis of liver: Secondary | ICD-10-CM | POA: Diagnosis present

## 2016-04-18 DIAGNOSIS — I482 Chronic atrial fibrillation, unspecified: Secondary | ICD-10-CM | POA: Diagnosis present

## 2016-04-18 DIAGNOSIS — D696 Thrombocytopenia, unspecified: Secondary | ICD-10-CM | POA: Diagnosis not present

## 2016-04-18 DIAGNOSIS — E876 Hypokalemia: Secondary | ICD-10-CM | POA: Diagnosis present

## 2016-04-18 DIAGNOSIS — I959 Hypotension, unspecified: Secondary | ICD-10-CM | POA: Diagnosis present

## 2016-04-18 DIAGNOSIS — F1020 Alcohol dependence, uncomplicated: Secondary | ICD-10-CM

## 2016-04-18 DIAGNOSIS — R1084 Generalized abdominal pain: Secondary | ICD-10-CM

## 2016-04-18 DIAGNOSIS — I4510 Unspecified right bundle-branch block: Secondary | ICD-10-CM | POA: Diagnosis present

## 2016-04-18 DIAGNOSIS — R946 Abnormal results of thyroid function studies: Secondary | ICD-10-CM | POA: Diagnosis present

## 2016-04-18 DIAGNOSIS — K8020 Calculus of gallbladder without cholecystitis without obstruction: Secondary | ICD-10-CM | POA: Diagnosis not present

## 2016-04-18 DIAGNOSIS — R000 Tachycardia, unspecified: Secondary | ICD-10-CM | POA: Diagnosis not present

## 2016-04-18 DIAGNOSIS — B182 Chronic viral hepatitis C: Secondary | ICD-10-CM

## 2016-04-18 DIAGNOSIS — K922 Gastrointestinal hemorrhage, unspecified: Secondary | ICD-10-CM | POA: Diagnosis not present

## 2016-04-18 DIAGNOSIS — Z86718 Personal history of other venous thrombosis and embolism: Secondary | ICD-10-CM | POA: Diagnosis not present

## 2016-04-18 DIAGNOSIS — I4892 Unspecified atrial flutter: Secondary | ICD-10-CM | POA: Diagnosis not present

## 2016-04-18 DIAGNOSIS — K760 Fatty (change of) liver, not elsewhere classified: Secondary | ICD-10-CM | POA: Diagnosis not present

## 2016-04-18 DIAGNOSIS — Z9114 Patient's other noncompliance with medication regimen: Secondary | ICD-10-CM | POA: Diagnosis not present

## 2016-04-18 DIAGNOSIS — R7989 Other specified abnormal findings of blood chemistry: Secondary | ICD-10-CM | POA: Diagnosis present

## 2016-04-18 DIAGNOSIS — I10 Essential (primary) hypertension: Secondary | ICD-10-CM | POA: Diagnosis present

## 2016-04-18 DIAGNOSIS — K253 Acute gastric ulcer without hemorrhage or perforation: Secondary | ICD-10-CM | POA: Diagnosis not present

## 2016-04-18 DIAGNOSIS — I5022 Chronic systolic (congestive) heart failure: Secondary | ICD-10-CM | POA: Diagnosis not present

## 2016-04-18 DIAGNOSIS — I9589 Other hypotension: Secondary | ICD-10-CM | POA: Diagnosis not present

## 2016-04-18 DIAGNOSIS — R6510 Systemic inflammatory response syndrome (SIRS) of non-infectious origin without acute organ dysfunction: Secondary | ICD-10-CM

## 2016-04-18 DIAGNOSIS — Z79899 Other long term (current) drug therapy: Secondary | ICD-10-CM

## 2016-04-18 DIAGNOSIS — B1710 Acute hepatitis C without hepatic coma: Secondary | ICD-10-CM | POA: Diagnosis present

## 2016-04-18 DIAGNOSIS — I4891 Unspecified atrial fibrillation: Secondary | ICD-10-CM | POA: Insufficient documentation

## 2016-04-18 DIAGNOSIS — R109 Unspecified abdominal pain: Secondary | ICD-10-CM | POA: Diagnosis present

## 2016-04-18 DIAGNOSIS — K559 Vascular disorder of intestine, unspecified: Secondary | ICD-10-CM

## 2016-04-18 DIAGNOSIS — R9431 Abnormal electrocardiogram [ECG] [EKG]: Secondary | ICD-10-CM | POA: Diagnosis present

## 2016-04-18 LAB — ETHANOL: Alcohol, Ethyl (B): 24 mg/dL — ABNORMAL HIGH (ref <5)

## 2016-04-18 LAB — POC OCCULT BLOOD, ED: FECAL OCCULT BLD: NEGATIVE

## 2016-04-18 LAB — COMPREHENSIVE METABOLIC PANEL
ALBUMIN: 2.1 g/dL — AB (ref 3.5–5.0)
ALT: 77 U/L — ABNORMAL HIGH (ref 17–63)
ANION GAP: 15 (ref 5–15)
AST: 252 U/L — ABNORMAL HIGH (ref 15–41)
Alkaline Phosphatase: 118 U/L (ref 38–126)
BUN: <5 mg/dL — ABNORMAL LOW (ref 6–20)
CO2: 20 mmol/L — AB (ref 22–32)
Calcium: 8.1 mg/dL — ABNORMAL LOW (ref 8.9–10.3)
Chloride: 102 mmol/L (ref 101–111)
Creatinine, Ser: 0.86 mg/dL (ref 0.61–1.24)
GFR calc non Af Amer: >60 mL/min (ref >60)
GLUCOSE: 114 mg/dL — AB (ref 65–99)
POTASSIUM: 4.3 mmol/L (ref 3.5–5.1)
SODIUM: 137 mmol/L (ref 135–145)
Total Bilirubin: 2.1 mg/dL — ABNORMAL HIGH (ref 0.3–1.2)
Total Protein: 6.2 g/dL — ABNORMAL LOW (ref 6.5–8.1)

## 2016-04-18 LAB — CBC WITH DIFFERENTIAL/PLATELET
BASOS ABS: 0 10*3/uL (ref 0.0–0.1)
Basophils Relative: 0 %
EOS PCT: 0 %
Eosinophils Absolute: 0 10*3/uL (ref 0.0–0.7)
HEMATOCRIT: 41.7 % (ref 39.0–52.0)
Hemoglobin: 14.5 g/dL (ref 13.0–17.0)
LYMPHS ABS: 1.3 10*3/uL (ref 0.7–4.0)
LYMPHS PCT: 22 %
MCH: 36 pg — AB (ref 26.0–34.0)
MCHC: 34.8 g/dL (ref 30.0–36.0)
MCV: 103.5 fL — AB (ref 78.0–100.0)
MONO ABS: 0.9 10*3/uL (ref 0.1–1.0)
Monocytes Relative: 15 %
NEUTROS ABS: 3.8 10*3/uL (ref 1.7–7.7)
Neutrophils Relative %: 63 %
PLATELETS: 142 10*3/uL — AB (ref 150–400)
RBC: 4.03 MIL/uL — AB (ref 4.22–5.81)
RDW: 17.9 % — AB (ref 11.5–15.5)
WBC: 6 10*3/uL (ref 4.0–10.5)

## 2016-04-18 LAB — I-STAT CG4 LACTIC ACID, ED
LACTIC ACID, VENOUS: 7.52 mmol/L — AB (ref 0.5–1.9)
Lactic Acid, Venous: 7.42 mmol/L (ref 0.5–1.9)

## 2016-04-18 LAB — I-STAT TROPONIN, ED: TROPONIN I, POC: 0.04 ng/mL (ref 0.00–0.08)

## 2016-04-18 LAB — LIPASE, BLOOD: LIPASE: 21 U/L (ref 11–51)

## 2016-04-18 MED ORDER — THIAMINE HCL 100 MG/ML IJ SOLN
100.0000 mg | Freq: Once | INTRAMUSCULAR | Status: AC
  Administered 2016-04-18: 100 mg via INTRAVENOUS
  Filled 2016-04-18: qty 2

## 2016-04-18 MED ORDER — SODIUM CHLORIDE 0.9 % IV BOLUS (SEPSIS)
1000.0000 mL | Freq: Once | INTRAVENOUS | Status: AC
  Administered 2016-04-18: 1000 mL via INTRAVENOUS

## 2016-04-18 MED ORDER — METOPROLOL TARTRATE 5 MG/5ML IV SOLN
2.5000 mg | Freq: Once | INTRAVENOUS | Status: AC
  Administered 2016-04-18: 2.5 mg via INTRAVENOUS
  Filled 2016-04-18: qty 5

## 2016-04-18 MED ORDER — DILTIAZEM HCL 100 MG IV SOLR
5.0000 mg/h | Freq: Once | INTRAVENOUS | Status: AC
  Administered 2016-04-18: 5 mg/h via INTRAVENOUS
  Filled 2016-04-18: qty 100

## 2016-04-18 MED ORDER — IOPAMIDOL (ISOVUE-370) INJECTION 76%
INTRAVENOUS | Status: AC
  Administered 2016-04-18: 100 mL
  Filled 2016-04-18: qty 100

## 2016-04-18 MED ORDER — ASPIRIN 325 MG PO TABS
325.0000 mg | ORAL_TABLET | Freq: Once | ORAL | Status: AC
  Administered 2016-04-18: 325 mg via ORAL
  Filled 2016-04-18: qty 1

## 2016-04-18 NOTE — H&P (Addendum)
History and Physical    Nathan Frank [id number] DOB: July 22, 1941 DOA: 04/18/2016  Referring MD/NP/PA:   PCP: Thressa Sheller, MD   Patient coming from:  The patient is coming from home.  At baseline, pt is independent for most of ADL.       Chief Complaint: Chest pain, nausea, vomiting, diarrhea, abdominal pain, black stool and generalized weakness  HPI: Nathan Frank is a 75 y.o. male with medical history significant of atrial fibrillation not on anticoagulation due to hx of GI bleeding, liver cirrhosis, HCV, alcohol abuse, heroin abuse, DVT, gastric ulcer, GI bleeding, CAD, medication noncompliance, who presents with chest pain, nausea, vomiting, diarrhea, abdominal pain, black stool and generalized weakness  Patient reports that he has been having generalized weeks for 3 days. He has chest pain. It is located in the substernal area, constant, 8 out of 10 in severity, nonradiating. It is not aggravated or deviated any known factors. He does not have cough, shortness of breath fever or chills.  He also reports nausea, vomiting, diarrhea, abdominal pain and black stool. He states that he vomited twice yesterday, no vomiting today. He has watery bowel movement, 5-6 times each day in the past 2 days. He is not taking laxatives. He has diffused and constant abdominal pain, which is 8 out of 10 in severity, nonradiating. It is not aggravated or alleviated by any known factors. He reports that his stool is black. He is not taking iron supplements.  Patient states that he has dysuria sometimes, no burning on urination or urinary frequency. He does not have unilateral weakness.  ED Course: pt was found to have elevated lactate of 7.42-->7.52, hypotension with blood pressure 84/68, which improved to 106/66 after 2 L normal saline bolus, tachycardia with heart rate 170, troponin negative, lipase 21, WBC 6.0, temperature normal, tachypnea, electrolytes and renal function okay, alcohol level 24,  negative FOBT. Pending UA.  CTA of abdomen/pelvis showed no evidence of bowel ischemia, advanced cirrhosis with its type chronic changes of the liver and fatty infiltration, evidence of portal hypertension with gastric and paraesophageal varices prone to upper GI bleed, cholelithiasis, no evidence of bowel obstruction or active inflammation. Abdominal ultrasound was ordered by EDP (pending). Pt is admitted to stepdown bed for further eval is in treatment.  Review of Systems:   General: no fevers, chills, no changes in body weight, has poor appetite, has fatigue HEENT: no blurry vision, hearing changes or sore throat Pulm: no dyspnea, coughing, wheezing CV: has chest pain, no palpitations Abd: has nausea, vomiting, abdominal pain, diarrhea, no constipation GU: has dysuria, no burning on urination, increased urinary frequency, hematuria  Ext: trace leg edema Neuro: no unilateral weakness, numbness, or tingling, no vision change or hearing loss Skin: no rash MSK: No muscle spasm, no deformity, no limitation of range of movement in spin Heme: No easy bruising.  Travel history: No recent long distant travel.  Allergy: No Known Allergies  Past Medical History  Diagnosis Date  . Neuropathy ([place])   . Essential hypertension   . Hepatitis C   . Cirrhosis ([place]) 06/04/2012  . Alcohol abuse   . GERD (gastroesophageal reflux disease)   . GI bleed due to NSAIDs 12/13/2014  . Gallstones   . Fatty liver   . Heroin abuse     "I haven't done that since I don't know when."  . Granulomatous gastritis   . Polysubstance abuse     Rare marijuana.  No EtOH x 2 months.   Marland Kitchen  SVT (supraventricular tachycardia) ([place])     a. 12/2014 in setting of GIB, ETOH, NSAIDS, gastritis.  . Prolonged Q-T interval on ECG     a. 12/2014 - treated with magnesium.  . NSTEMI (non-ST elevated myocardial infarction) ([place])     a. 04/2015 - patent coronaries. Etiology possibly due to coronary spasm versus embolus, stress  cardiomyopathy (atypical), and aborted infarction related to plaque rupture with thrombosis and dissolution. Amlodipine started. Not on antiplatelets due to GIB/cirrhosis history.  . Abnormal TSH   . LV dysfunction     a. 04/2015: EF 45-50% by cath.  . Anemia   . Thrombocytopenia ([place])   . DVT of lower limb, acute ([place]) 06/06/2012  . Dizziness and giddiness 12/13/2014  . Acute gastric ulcer   . Acute gastritis with hemorrhage   . Alcohol dependence ([place]) 02/02/2014  . Depressive disorder 02/01/2014  . Gastritis 06/07/2012  . Hematuria 06/04/2012  . Heroin overdose 02/20/2014  . Oral thrush 06/05/2012  . Right knee pain 12/13/2014  . S/P alcohol detoxification 02/02/2014  . Symptomatic cholelithiasis 12/15/2013  . Upper GI bleeding 12/13/2014  . Weight loss 06/04/2012    Past Surgical History  Procedure Laterality Date  . Esophagogastroduodenoscopy  06/07/2012    Procedure: ESOPHAGOGASTRODUODENOSCOPY (EGD);  Surgeon: Milus Banister, MD;  Location: [facility];  Service: Endoscopy;  Laterality: N/A;  may need treatment of varices  . Tonsillectomy    . Esophagogastroduodenoscopy N/A 12/14/2014    Procedure: ESOPHAGOGASTRODUODENOSCOPY (EGD);  Surgeon: Jerene Bears, MD;  Location: [facility] ENDOSCOPY;  Service: Endoscopy;  Laterality: N/A;  . Circumcision    . Cardiac catheterization N/A 04/15/2015    Procedure: Left Heart Cath and Coronary Angiography;  Surgeon: Belva Crome, MD;  Location: [facility] CV LAB;  Service: Cardiovascular;  Laterality: N/A;    Social History:  reports that he has quit smoking. His smoking use included Cigarettes. He has a 2.5 pack-year smoking history. He has never used smokeless tobacco. He reports that he drinks about 16.8 oz of alcohol per week. He reports that he uses illicit drugs (Heroin).  Family History:  Family History  Problem Relation Age of Onset  . Stroke Father   . Prostate cancer Brother   . Heart disease Mother     Pacemaker     Prior to Admission  medications   Medication Sig Start Date End Date Taking? Authorizing Provider  lisinopril (PRINIVIL,ZESTRIL) 2.5 MG tablet Take 1 tablet (2.5 mg total) by mouth daily. 12/13/15   Fay Records, MD  metoprolol tartrate (LOPRESSOR) 25 MG tablet TAKE 3 TABLETS BY MOUTH TWICE A DAY 03/31/16   Rhonda G Barrett, PA-C  metoprolol tartrate (LOPRESSOR) 25 MG tablet Take 75 mg by mouth 2 (two) times daily. 03/03/16   Historical Provider, MD  omeprazole (PRILOSEC) 40 MG capsule Take 1 capsule (40 mg total) by mouth daily. For acid reflux 12/15/14   Thurnell Lose, MD  spironolactone (ALDACTONE) 25 MG tablet Take 1 tablet (25 mg total) by mouth daily. 12/24/15   Fay Records, MD    Physical Exam: Filed Vitals:   04/18/16 2300 04/18/16 2315 04/18/16 2340 04/19/16 0000  BP: 116/76 114/72 107/69 103/64  Pulse: 84 88 85 84  Temp:      TempSrc:      Resp: 17   15  Height:      Weight:      SpO2: 100% 99% 100% 98%   General: Not in acute distress HEENT:  Eyes: PERRL, EOMI, no scleral icterus.       ENT: No discharge from the ears and nose, no pharynx injection, no tonsillar enlargement.        Neck: No JVD, no bruit, no mass felt. Heme: No neck lymph node enlargement. Cardiac: S1/S2, RRR, No murmurs, No gallops or rubs. Pulm: No rales, wheezing, rhonchi or rubs. Abd: Soft, nondistended, diffused tenderness, no rebound pain, no organomegaly, BS present. GU: No hematuria Ext: Trace leg edema bilaterally. 2+DP/PT pulse bilaterally. Musculoskeletal: No joint deformities, No joint redness or warmth, no limitation of ROM in spin. Skin: No rashes.  Neuro: Alert, oriented X3, cranial nerves II-XII grossly intact, moves all extremities normally.  Psych: Patient is not psychotic, no suicidal or hemocidal ideation.  Labs on Admission: I have personally reviewed following labs and imaging studies  CBC:  Recent Labs Lab 04/18/16 1855  WBC 6.0  NEUTROABS 3.8  HGB 14.5  HCT 41.7  MCV 103.5*  PLT  [id number]*   Basic Metabolic Panel:  Recent Labs Lab 04/18/16 1855  NA 137  K 4.3  CL 102  CO2 20*  GLUCOSE 114*  BUN <5*  CREATININE 0.86  CALCIUM 8.1*   GFR: Estimated Creatinine Clearance: 81.5 mL/min (by C-G formula based on Cr of 0.86). Liver Function Tests:  Recent Labs Lab 04/18/16 1855  AST 252*  ALT 77*  ALKPHOS 118  BILITOT 2.1*  PROT 6.2*  ALBUMIN 2.1*    Recent Labs Lab 04/18/16 1855  LIPASE 21   No results for input(s): AMMONIA in the last 168 hours. Coagulation Profile: No results for input(s): INR, PROTIME in the last 168 hours. Cardiac Enzymes: No results for input(s): CKTOTAL, CKMB, CKMBINDEX, TROPONINI in the last 168 hours. BNP (last 3 results) No results for input(s): PROBNP in the last 8760 hours. HbA1C: No results for input(s): HGBA1C in the last 72 hours. CBG: No results for input(s): GLUCAP in the last 168 hours. Lipid Profile: No results for input(s): CHOL, HDL, LDLCALC, TRIG, CHOLHDL, LDLDIRECT in the last 72 hours. Thyroid Function Tests: No results for input(s): TSH, T4TOTAL, FREET4, T3FREE, THYROIDAB in the last 72 hours. Anemia Panel: No results for input(s): VITAMINB12, FOLATE, FERRITIN, TIBC, IRON, RETICCTPCT in the last 72 hours. Urine analysis:    Component Value Date/Time   COLORURINE AMBER* 12/13/2014 1256   APPEARANCEUR HAZY* 12/13/2014 1256   LABSPEC 1.022 12/13/2014 1256   PHURINE 7.5 12/13/2014 1256   GLUCOSEU NEGATIVE 12/13/2014 1256   HGBUR TRACE* 12/13/2014 1256   BILIRUBINUR SMALL* 12/13/2014 1256   KETONESUR 15* 12/13/2014 1256   PROTEINUR 30* 12/13/2014 1256   UROBILINOGEN 2.0* 12/13/2014 1256   NITRITE NEGATIVE 12/13/2014 1256   LEUKOCYTESUR TRACE* 12/13/2014 1256   Sepsis Labs: @LABRCNTIP (procalcitonin:4,lacticidven:4) )No results found for this or any previous visit (from the past 240 hour(s)).   Radiological Exams on Admission: Dg Chest Port 1 View  04/18/2016  CLINICAL DATA:  75 year old male with  chest pain and vomiting for 3 days. EXAM: PORTABLE CHEST 1 VIEW COMPARISON:  12/02/2015 and prior radiographs FINDINGS: Cardiomegaly again noted. There is no evidence of focal airspace disease, pulmonary edema, suspicious pulmonary nodule/mass, pleural effusion, or pneumothorax. No acute bony abnormalities are identified. Remote left rib fractures are noted. IMPRESSION: Cardiomegaly without evidence of acute cardiopulmonary disease. Electronically Signed   By: Margarette Canada M.D.   On: 04/18/2016 19:55   Ct Angio Abd/pel W/ And/or W/o  04/18/2016  CLINICAL DATA:  75 year old male with upper GI bleed concern for  mesenteric ischemia. EXAM: CTA ABDOMEN AND PELVIS wITHOUT AND WITH CONTRAST TECHNIQUE: Multidetector CT imaging of the abdomen and pelvis was performed using the standard protocol during bolus administration of intravenous contrast. Multiplanar reconstructed images and MIPs were obtained and reviewed to evaluate the vascular anatomy. CONTRAST:  100 cc Isovue 370 COMPARISON:  Abdominal CT dated 12/12/2013 and ultrasound dated 12/12/2013 and 01/07/2016. FINDINGS: The visualized lung bases are clear. No intra-abdominal free air or free fluid. There is fatty infiltration of the liver with advanced cirrhosis and fibrotic changes. Focal lesion or focal enhancement identified. There multiple stones within the gallbladder. No pericholecystic fluid. Ultrasound may provide better evaluation of the gallbladder if clinically indicated. The pancreas, spleen, adrenal glands appear unremarkable. There is no hydronephrosis on either side. The visualized ureters and urinary bladder appear unremarkable. The prostate and seminal vesicles are grossly unremarkable. Evaluation of the bowel is limited in the absence of oral contrast. There is no evidence of bowel obstruction or active inflammation. There is minimal prominence of the tip of the appendix. The appendix is otherwise unremarkable without inflammatory changes. There is  mild aortoiliac atherosclerotic disease. There is no aortic aneurysm or evidence of dissection. The origins of the celiac axis, SMA, IMA as well as the origins of the renal arteries appear patent. There is a classic celiac axis branching anatomy. No portal venous gas identified. First the SMV is, splenic vein, and main portal vein are patent. 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 There is no adenopathy. The abdominal wall soft tissues appear unremarkable. There is degenerative changes of the spine. No acute fracture. Review of the MIP images confirms the above findings. IMPRESSION: No CT evidence of bowel ischemia as clinically concerned. Advanced cirrhosis with its type chronic changes of the liver and fatty infiltration. No discrete focal mass identified. 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 Cholelithiasis. No evidence of bowel obstruction or active inflammation. Normal appendix. Electronically Signed   By: Anner Crete M.D.   On: 04/18/2016 20:53     EKG: Independently reviewed. SVT vs. ? atrial flutter with RVR, prolonged QTC 560, tachycardia with heart rate 172, right bundle blockage.  Assessment/Plan Principal Problem:   Atrial flutter with rapid ventricular response (HCC) Active Problems:   Hepatitis C   Cirrhosis (HCC)   Abdominal pain   Alcohol dependence (HCC)   Upper GI bleeding   Acute gastric ulcer   Prolonged Q-T interval on ECG   Essential hypertension   Chest pain   Hypotension   Elevated lactic acid level   Chronic atrial fibrillation (HCC)   SIRS (systemic inflammatory response syndrome) (HCC)   Hx of A fib, SVT and CP: pt has tachycardia with heart rates up to 170, seems to be atrial flutter with 2:1 block. HR was slowed down to 148 with IV cardizem gtt. Pt had hypotension with Bp 84/68 which responded to IVF, and improved to 106/66 after 2L NS bolus. Pt has ongoing chest  pain, possibly due to demanding ischemia. Initial troponin is negative. Pt may have alcoholic gastritis, which may have contributed to his chest pain. Patient has prolonged QTC 560, cannot use pepcid. Due to diarrhea, need to rule out c diff colitis before starting protonix.  -will admit to SDU -continue IV cardizem gtt -check TSH -continue metoprolol - cycle CE q6 x3 - prn Nitroglycerin, oxycodone - pt received 325 mg of ASA in ED, will hold ASA since pt has high risk  of GI bleeding. CT scan showed evidence of portal hypertension with gastric and paraesophageal varices prone to upper GI bleed. - Risk factor stratification: will check FLP and A1C  - 2d echo - please call Card in AM  Nausea, vomiting, diarrhea, abdominal pain: CT abdomen/pelvis did not show ischemic bowel. No significant ascites. Patient does not have fever, leukocytosis, less likely to have SBP. Lipase is normal. Likely due to viral gastritis, but need to rule out C. difficile colitis. -check C diff prn and Gi path panel -When necessary hydroxyzine for nausea -IV fluid: 3L NS and then 75 cc/h  Elevated lactic acid level: Etiology is not clear. Differential diagnosis include SIRS vs.sepsis, hypoperfusion secondary to hypotension and starvation due to alcohol abuse. -will get Procalcitonin and trend lactic acid levels per sepsis protocol. -IVF: 3L of NS bolus in ED, followed by 75 cc/h of D5-1/2NS  -will get  Blood culture if pt develops fever (not ordered yet)  Hypotension: blood pressure 84/68, which improved to 106/66 after 2 L normal saline bolus. This is likely due to combination of hypovolemic volume status secondary to diarrhea and severe tachycardia. Currently hemodynamically stable. -IV fluid as above  Sepsis -re-Assessment   Performed at:    1:31 AM   Last Vitals:    Blood pressure 103/64, pulse 84, temperature 99.7 F (37.6 C), temperature source Rectal, resp. rate 15, height 6' (1.829 m), weight 81.647 kg  (180 lb), SpO2 98 %.  Heart:      Regular rhythm  Lungs:     Clear to auscultation  Capillary Refill:   <2 seconds    Peripheral Pulse (include location): Brachial pulse palpable   Skin (include color):              Normal   Hx of gastric ulcer and upper GI bleeding:  Pt reports dark stool, but FOBT is negative. Hgb is stable at 14.5. Doubt that pt has active GIB, but he is at high risk of bleeding -can not use pepecid or protonix now as stated above -will state Carafate  Hepatitis C and Cirrhosis: Mental status is normal. No sense of hepatic encephalopathy. No significant ascites on CT scan. -Avoid medications with liver toxicity, such as atenolol  Alcohol abuse: -Did counseling about the importance of quitting drinking -CIWA protocol   DVT ppx: SCD Code Status: Full code Family Communication: None at bed side.  Disposition Plan:  Anticipate discharge back to previous home environment Consults called:  none Admission status:  SDU/inpation       Date of Service 04/19/2016    Ivor Costa [facility] Pager [phone]  If 7PM-7AM, please contact night-coverage [email or web address] Password [email or web address] 04/19/2016, 12:12 AM

## 2016-04-18 NOTE — ED Provider Notes (Signed)
CSN: [id number]     Arrival date & time 04/18/16  [id number] History   First MD Initiated Contact with Patient 04/18/16 1825     Chief Complaint  Patient presents with  . Chest Pain  . Abdominal Pain     (Consider location/radiation/quality/duration/timing/severity/associated sxs/prior Treatment) Patient is a 75 y.o. male presenting with chest pain and abdominal pain.  Chest Pain Pain location:  Substernal area Pain quality: burning and pressure   Pain radiates to:  Does not radiate Pain severity:  Moderate Onset quality:  Gradual Duration:  1 day Timing:  Constant Progression:  Waxing and waning Chronicity:  Recurrent Associated symptoms: abdominal pain, anorexia, fatigue, vomiting and weakness   Associated symptoms: no back pain, no cough, no diaphoresis, no fever, no headache, no lower extremity edema, no nausea, no numbness, no orthopnea, no palpitations, no shortness of breath and no syncope   Abdominal Pain Pain location:  Epigastric and RLQ Pain quality: aching and gnawing   Pain radiates to:  Does not radiate Pain severity:  Moderate Duration:  1 week Context: alcohol use (most recently yesterday)   Associated symptoms: anorexia, belching, chest pain, fatigue, melena and vomiting   Associated symptoms: no chills, no constipation, no cough, no diarrhea, no dysuria, no fever, no hematemesis, no nausea and no shortness of breath  Vaginal discharge: x3 yesterday.     Past Medical History  Diagnosis Date  . Neuropathy ([place])   . Essential hypertension   . Hepatitis C   . 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 Alcohol abuse   . GERD (gastroesophageal reflux disease)   . GI bleed due to NSAIDs 12/13/2014  . Gallstones   . Fatty liver   . Heroin abuse     "I haven't done that since I don't know when."  . Granulomatous gastritis   . Polysubstance abuse     Rare marijuana.  No EtOH x 2 months.   . SVT (supraventricular tachycardia) ([place])     a. 12/2014 in setting of GIB, ETOH, NSAIDS,  gastritis.  . Prolonged Q-T interval on ECG     a. 12/2014 - treated with magnesium.  . NSTEMI (non-ST elevated myocardial infarction) ([place])     a. 04/2015 - patent coronaries. Etiology possibly due to coronary spasm versus embolus, stress cardiomyopathy (atypical), and aborted infarction related to plaque rupture with thrombosis and dissolution. Amlodipine started. Not on antiplatelets due to GIB/cirrhosis history.  . Abnormal TSH   . LV dysfunction     a. 04/2015: EF 45-50% by cath.  . Anemia   . Thrombocytopenia ([place])   . DVT of lower limb, acute ([place]) 06/06/2012  . Dizziness and giddiness 12/13/2014  . Acute gastric ulcer   . Acute gastritis with hemorrhage   . Alcohol dependence ([place]) 02/02/2014  . Depressive disorder 02/01/2014  . Gastritis 06/07/2012  . Hematuria 06/04/2012  . Heroin overdose 02/20/2014  . Oral thrush 06/05/2012  . Right knee pain 12/13/2014  . S/P alcohol detoxification 02/02/2014  . Symptomatic cholelithiasis 12/15/2013  . Upper GI bleeding 12/13/2014  . Weight loss 06/04/2012   Past Surgical History  Procedure Laterality Date  . Esophagogastroduodenoscopy  06/07/2012    Procedure: ESOPHAGOGASTRODUODENOSCOPY (EGD);  Surgeon: Milus Banister, MD;  Location: [place];  Service: Endoscopy;  Laterality: N/A;  may need treatment of varices  . Tonsillectomy    . Esophagogastroduodenoscopy N/A 12/14/2014    Procedure: ESOPHAGOGASTRODUODENOSCOPY (EGD);  Surgeon: Jerene Bears, MD;  Location[facility] ENDOSCOPY;  Service: Endoscopy;  Laterality:  N/A;  . Circumcision    . Cardiac catheterization N/A 04/15/2015    Procedure: Left Heart Cath and Coronary Angiography;  Surgeon: Belva Crome, MD;  Location: [place] CV LAB;  Service: Cardiovascular;  Laterality: N/A;   Family History  Problem Relation Age of Onset  . Stroke Father   . Prostate cancer Brother   . Heart disease Mother     Pacemaker   Social History  Substance Use Topics  . Smoking status: Former Smoker -- 0.50  packs/day for 5 years    Types: Cigarettes  . Smokeless tobacco: Never Used     Comment: "quit smoking cigarettes in the 1970's"  . Alcohol Use: 16.8 oz/week    28 Glasses of wine per week     Comment: drinks 1 pint of wine/day     Review of Systems  Constitutional: Positive for fatigue. Negative for fever, chills and diaphoresis.  HENT: Negative for congestion.   Eyes: Negative for visual disturbance.  Respiratory: Negative for cough, chest tightness and shortness of breath.   Cardiovascular: Positive for chest pain. Negative for palpitations, orthopnea and syncope.  Gastrointestinal: Positive for vomiting, abdominal pain, blood in stool (black stools), melena, abdominal distention and anorexia. Negative for nausea, diarrhea, constipation and hematemesis.  Genitourinary: Negative for dysuria and flank pain. Vaginal discharge: x3 yesterday.  Musculoskeletal: Negative for back pain and neck pain.  Skin: Negative for rash.  Neurological: Positive for weakness. Negative for numbness and headaches.  Psychiatric/Behavioral: Negative for confusion and agitation.      Allergies  Review of patient's allergies indicates no known allergies.  Home Medications   Prior to Admission medications   Medication Sig Start Date End Date Taking? Authorizing Provider  lisinopril (PRINIVIL,ZESTRIL) 2.5 MG tablet Take 1 tablet (2.5 mg total) by mouth daily. 12/13/15   Fay Records, MD  metoprolol tartrate (LOPRESSOR) 25 MG tablet TAKE 3 TABLETS BY MOUTH TWICE A DAY 03/31/16   Rhonda G Barrett, PA-C  metoprolol tartrate (LOPRESSOR) 25 MG tablet Take 75 mg by mouth 2 (two) times daily. 03/03/16   Historical Provider, MD  omeprazole (PRILOSEC) 40 MG capsule Take 1 capsule (40 mg total) by mouth daily. For acid reflux 12/15/14   Thurnell Lose, MD  spironolactone (ALDACTONE) 25 MG tablet Take 1 tablet (25 mg total) by mouth daily. 12/24/15   Fay Records, MD   BP 105/67 mmHg  Pulse 92  Temp(Src) 98.1 F  (36.7 C) (Oral)  Resp 16  Ht 6' (1.829 m)  Wt 93.4 kg  BMI 27.92 kg/m2  SpO2 98% Physical Exam  Constitutional: He is oriented to person, place, and time. He appears well-developed and well-nourished. No distress.  HENT:  Head: Normocephalic and atraumatic.  Eyes: Conjunctivae are normal.  Cardiovascular: Normal heart sounds and intact distal pulses.   No murmur heard. tachycardic  Pulmonary/Chest: Effort normal and breath sounds normal. No respiratory distress. He has no wheezes.  Abdominal: Soft. He exhibits no distension. There is tenderness (mild diffuse abdominal tenderness). There is no rebound and no guarding.  Musculoskeletal: He exhibits no edema.  Neurological: He is alert and oriented to person, place, and time. No cranial nerve deficit.  Skin: Skin is warm. No rash noted. He is not diaphoretic.  Psychiatric: He has a normal mood and affect. His behavior is normal.  Nursing note and vitals reviewed.   ED Course  Procedures (including critical care time) Labs Review Labs Reviewed  COMPREHENSIVE METABOLIC PANEL - Abnormal; Notable for  the following:    CO2 20 (*)    Glucose, Bld 114 (*)    BUN <5 (*)    Calcium 8.1 (*)    Total Protein 6.2 (*)    Albumin 2.1 (*)    AST 252 (*)    ALT 77 (*)    Total Bilirubin 2.1 (*)    All other components within normal limits  ETHANOL - Abnormal; Notable for the following:    Alcohol, Ethyl (B) 24 (*)    All other components within normal limits  CBC WITH DIFFERENTIAL/PLATELET - Abnormal; Notable for the following:    RBC 4.03 (*)    MCV 103.5 (*)    MCH 36.0 (*)    RDW 17.9 (*)    Platelets 142 (*)    All other components within normal limits  BRAIN NATRIURETIC PEPTIDE - Abnormal; Notable for the following:    B Natriuretic Peptide 375.8 (*)    All other components within normal limits  I-STAT CG4 LACTIC ACID, ED - Abnormal; Notable for the following:    Lactic Acid, Venous 7.42 (*)    All other components within  normal limits  I-STAT CG4 LACTIC ACID, ED - Abnormal; Notable for the following:    Lactic Acid, Venous 7.52 (*)    All other components within normal limits  C DIFFICILE QUICK SCREEN W PCR REFLEX  GASTROINTESTINAL PANEL BY PCR, STOOL (REPLACES STOOL CULTURE)  MRSA PCR SCREENING  LIPASE, BLOOD  URINALYSIS, ROUTINE W REFLEX MICROSCOPIC (NOT AT [facility])  TSH  LACTIC ACID, PLASMA  LACTIC ACID, PLASMA  PROCALCITONIN  PROTIME-INR  APTT  TROPONIN I  TROPONIN I  TROPONIN I  HEMOGLOBIN A1C  LIPID PANEL  URINE RAPID DRUG SCREEN, HOSP PERFORMED  I-STAT TROPOININ, ED  POC OCCULT BLOOD, ED  Randolm Idol, ED    Imaging Review Dg Chest Port 1 View  04/18/2016  CLINICAL DATA:  75 year old male with chest pain and vomiting for 3 days. EXAM: PORTABLE CHEST 1 VIEW COMPARISON:  12/02/2015 and prior radiographs FINDINGS: Cardiomegaly again noted. There is no evidence of focal airspace disease, pulmonary edema, suspicious pulmonary nodule/mass, pleural effusion, or pneumothorax. No acute bony abnormalities are identified. Remote left rib fractures are noted. IMPRESSION: Cardiomegaly without evidence of acute cardiopulmonary disease. Electronically Signed   By: Margarette Canada M.D.   On: 04/18/2016 19:55   Ct Angio Abd/pel W/ And/or W/o  04/18/2016  CLINICAL DATA:  75 year old male with upper GI bleed concern for mesenteric ischemia. EXAM: CTA ABDOMEN AND PELVIS wITHOUT AND WITH CONTRAST TECHNIQUE: Multidetector CT imaging of the abdomen and pelvis was performed using the standard protocol during bolus administration of intravenous contrast. Multiplanar reconstructed images and MIPs were obtained and reviewed to evaluate the vascular anatomy. CONTRAST:  100 cc Isovue 370 COMPARISON:  Abdominal CT dated 12/12/2013 and ultrasound dated 12/12/2013 and 01/07/2016. FINDINGS: The visualized lung bases are clear. No intra-abdominal free air or free fluid. There is fatty infiltration of the liver with advanced  cirrhosis and fibrotic changes. Focal lesion or focal enhancement identified. There multiple stones within the gallbladder. No pericholecystic fluid. Ultrasound may provide better evaluation of the gallbladder if clinically indicated. The pancreas, spleen, adrenal glands appear unremarkable. There is no hydronephrosis on either side. The visualized ureters and urinary bladder appear unremarkable. The prostate and seminal vesicles are grossly unremarkable. Evaluation of the bowel is limited in the absence of oral contrast. There is no evidence of bowel obstruction or active inflammation. There is minimal prominence  of the tip of the appendix. The appendix is otherwise unremarkable without inflammatory changes. There is mild aortoiliac atherosclerotic disease. There is no aortic aneurysm or evidence of dissection. The origins of the celiac axis, SMA, IMA as well as the origins of the renal arteries appear patent. There is a classic celiac axis branching anatomy. No portal venous gas identified. First the SMV is, splenic vein, and main portal vein are patent. 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 There is no adenopathy. The abdominal wall soft tissues appear unremarkable. There is degenerative changes of the spine. No acute fracture. Review of the MIP images confirms the above findings. IMPRESSION: No CT evidence of bowel ischemia as clinically concerned. Advanced cirrhosis with its type chronic changes of the liver and fatty infiltration. No discrete focal mass identified. 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 Cholelithiasis. No evidence of bowel obstruction or active inflammation. Normal appendix. Electronically Signed   By: Anner Crete M.D.   On: 04/18/2016 20:53   US Abdomen Limited Ruq  04/19/2016  CLINICAL DATA:  Initial evaluation for acute abdominal pain. Gallstones on prior CT. EXAM: US ABDOMEN LIMITED -  RIGHT UPPER QUADRANT COMPARISON:  Prior CT from 04/18/2016. FINDINGS: Gallbladder: Multiple echogenic stones present within the gallbladder lumen. Gallbladder wall measured within normal limits at 2.4 mm. No free pericholecystic fluid. Sonographic Percell Miller sign was elicited on exam. Common bile duct: Diameter: 4.4 mm Liver: Extremely limited evaluation on this exam. Diffuse hepatic steatosis suspected. Known cirrhotic changes present. No focal mass. IMPRESSION: 1. Cholelithiasis with positive sonographic Murphy's sign. No other sonographic features indicative of acute cholecystitis. No biliary dilatation. 2. Cirrhosis with hepatic steatosis, better evaluated on recent CT. Electronically Signed   By: Jeannine Boga M.D.   On: 04/19/2016 00:56   I have personally reviewed and evaluated these images and lab results as part of my medical decision-making.   EKG Interpretation   Date/Time:  Saturday April 18 2016 18:31:12 EDT Ventricular Rate:  172 PR Interval:    QRS Duration: 124 QT Interval:  331 QTC Calculation: 560 R Axis:   131 Text Interpretation:  Supraventricular tachycardia RBBB and LPFB Confirmed  by Ashok Cordia  MD, Lennette Bihari ([id number]) on 04/18/2016 6:50:50 PM      MDM   Final diagnoses:  Lactic acidosis  Atrial fibrillation, unspecified type (HCC)  Tachycardia  Alcohol use disorder, moderate, in controlled environment, dependence (HCC)  Generalized abdominal pain  Atrial fibrillation with RVR ([place])    Patient's a 75 year old male with history alcohol-induced cirrhosis, atrial fibrillation, SVT, gastritis with upper GI bleed, hepatitis C, heroin use, and depression presenting today for generalized malaise over the last 2 days with associated chest pain and abdominal pain. He also reported multiple episodes of black stools for the last 2 days..  On arrival he was witnessed to have an sudden increase in heart rate from the 90s to 160s. He denied any new or worsened chest pain or shortness  of breath and was mentating clearly. Blood pressure was 105/67. It was difficult to distinguish on the monitor whether this was A. fib with RVR or SVT. 2.5 mg of Lopressor slowed the rate to 150s. A modified Valsalva maneuver was performed briefly breaking the tachyarrhythmia. He showed both a sinus rhythm with P waves and an irregular rhythm with no P waves in short succession after the Valsalva maneuver.  He shortly thereafter went back into his tachyarrhythmia and was also given 20  mg of Cardizem with conversion to normal sinus rhythm. Approximately 45 minutes after the bolus he was started on an infusion of 5 mg diltiazem when his heart rate again rose to the 140s. He was titrated up to 10 mg of Cardizem and was stable throughout the rest of his stay in the emergency department.  EKG after conversion showed normal sinus rhythm with no evidence of ischemia, abnormal intervals, or dysrhythmia.  Initial lactate was 7.4. There was concern given his generalized abdominal pain and this lactic acidosis for mesenteric ischemia. CT angiogram of the abdomen showed no evidence of of bowel ischemia. There was visualized advanced cirrhosis with no evidence of ascites.  After 2 L fluid administration a lactate was not improved and was in fact slightly higher at 7.5. His labs demonstrate a total bilirubin of 2.1 and AST in the 200s. Hemoccult test was negative. Hemoglobin was 14.5 with BUN less than 5 giving low concern for upper GI bleed. Troponin was negative. He was given an aspirin for the chest pain with history of an STEMI last year.  Chest x-ray showed cardiomegaly without evidence of acute cardiac or pulmonary disease.  Unsure the etiology of the patient's elevated lactic acid. It's possible the patient was globally underperfused during his tachyarrhythmia with soft pressures. It is also possible the patient has difficulty clearing her lactic acid given his known hepatic cirrhosis. Thiamine deficiency could  also be playing a role in the lactic acidosis. The patient was given 100 mg of supplemental IV thiamine. I do not see a source or evidence for infection at this time. Spontaneous bacterial peritonitis is unlikely given no evidence of ascites on CT, normal white blood cell count, and no fever.   The patient was admitted to the step down unit on 10 mg infusion of Cardizem for further workup and care.    Allie Bossier, MD 04/19/16 [id number]  Lajean Saver, MD 04/19/16 [phone]

## 2016-04-18 NOTE — ED Notes (Addendum)
BIB with [facility] C/O weakness, chest pain of an 8  And a rate of 177 upon arrival, with stomach and flank pain. Pt. sts he has had multiple black stools over the last 3 days. History of A-fib and non compliance with medication and chirrosis of the liver. No ASA or nitro given in route.

## 2016-04-19 DIAGNOSIS — R000 Tachycardia, unspecified: Secondary | ICD-10-CM | POA: Insufficient documentation

## 2016-04-19 DIAGNOSIS — I471 Supraventricular tachycardia: Principal | ICD-10-CM

## 2016-04-19 DIAGNOSIS — K253 Acute gastric ulcer without hemorrhage or perforation: Secondary | ICD-10-CM

## 2016-04-19 DIAGNOSIS — F1020 Alcohol dependence, uncomplicated: Secondary | ICD-10-CM | POA: Insufficient documentation

## 2016-04-19 DIAGNOSIS — E872 Acidosis, unspecified: Secondary | ICD-10-CM | POA: Insufficient documentation

## 2016-04-19 DIAGNOSIS — K922 Gastrointestinal hemorrhage, unspecified: Secondary | ICD-10-CM

## 2016-04-19 DIAGNOSIS — A419 Sepsis, unspecified organism: Secondary | ICD-10-CM | POA: Insufficient documentation

## 2016-04-19 DIAGNOSIS — I959 Hypotension, unspecified: Secondary | ICD-10-CM

## 2016-04-19 LAB — TROPONIN I
TROPONIN I: 0.06 ng/mL — AB (ref <0.03)
Troponin I: 0.05 ng/mL (ref <0.03)
Troponin I: 0.08 ng/mL (ref <0.03)

## 2016-04-19 LAB — URINALYSIS, ROUTINE W REFLEX MICROSCOPIC
GLUCOSE, UA: NEGATIVE mg/dL
HGB URINE DIPSTICK: NEGATIVE
Ketones, ur: NEGATIVE mg/dL
LEUKOCYTES UA: NEGATIVE
Nitrite: NEGATIVE
PROTEIN: NEGATIVE mg/dL
pH: 6.5 (ref 5.0–8.0)

## 2016-04-19 LAB — GASTROINTESTINAL PANEL BY PCR, STOOL (REPLACES STOOL CULTURE)
ASTROVIRUS: NOT DETECTED
Adenovirus F40/41: NOT DETECTED
CAMPYLOBACTER SPECIES: NOT DETECTED
Cryptosporidium: NOT DETECTED
Cyclospora cayetanensis: NOT DETECTED
E. coli O157: NOT DETECTED
ENTAMOEBA HISTOLYTICA: NOT DETECTED
ENTEROAGGREGATIVE E COLI (EAEC): NOT DETECTED
ENTEROPATHOGENIC E COLI (EPEC): NOT DETECTED
ENTEROTOXIGENIC E COLI (ETEC): NOT DETECTED
GIARDIA LAMBLIA: NOT DETECTED
NOROVIRUS GI/GII: NOT DETECTED
PLESIMONAS SHIGELLOIDES: NOT DETECTED
Rotavirus A: NOT DETECTED
Salmonella species: NOT DETECTED
Sapovirus (I, II, IV, and V): NOT DETECTED
Shiga like toxin producing E coli (STEC): NOT DETECTED
Shigella/Enteroinvasive E coli (EIEC): NOT DETECTED
VIBRIO CHOLERAE: NOT DETECTED
Vibrio species: NOT DETECTED
Yersinia enterocolitica: NOT DETECTED

## 2016-04-19 LAB — LIPID PANEL
CHOL/HDL RATIO: 7 ratio
Cholesterol: 77 mg/dL (ref 0–200)
HDL: 11 mg/dL — AB (ref >40)
LDL CALC: 36 mg/dL (ref 0–99)
Triglycerides: 151 mg/dL — ABNORMAL HIGH (ref <150)
VLDL: 30 mg/dL (ref 0–40)

## 2016-04-19 LAB — PROTIME-INR
INR: 1.89 — AB (ref 0.00–1.49)
Prothrombin Time: 21.6 seconds — ABNORMAL HIGH (ref 11.6–15.2)

## 2016-04-19 LAB — C DIFFICILE QUICK SCREEN W PCR REFLEX
C DIFFICILE (CDIFF) INTERP: NOT DETECTED
C DIFFICILE (CDIFF) TOXIN: NEGATIVE
C Diff antigen: NEGATIVE

## 2016-04-19 LAB — RAPID URINE DRUG SCREEN, HOSP PERFORMED
AMPHETAMINES: NOT DETECTED
BARBITURATES: NOT DETECTED
Benzodiazepines: POSITIVE — AB
Cocaine: NOT DETECTED
OPIATES: NOT DETECTED
TETRAHYDROCANNABINOL: NOT DETECTED

## 2016-04-19 LAB — PROCALCITONIN: Procalcitonin: <0.1 ng/mL

## 2016-04-19 LAB — LACTIC ACID, PLASMA: LACTIC ACID, VENOUS: 3.7 mmol/L — AB (ref 0.5–1.9)

## 2016-04-19 LAB — APTT: aPTT: 33 seconds (ref 24–37)

## 2016-04-19 LAB — BRAIN NATRIURETIC PEPTIDE: B NATRIURETIC PEPTIDE 5: 375.8 pg/mL — AB (ref 0.0–100.0)

## 2016-04-19 LAB — MRSA PCR SCREENING: MRSA by PCR: NEGATIVE

## 2016-04-19 LAB — TSH: TSH: 4.63 u[IU]/mL — AB (ref 0.350–4.500)

## 2016-04-19 MED ORDER — THIAMINE HCL 100 MG/ML IJ SOLN: 100.0000 mg | Freq: Every day | INTRAMUSCULAR | Status: DC

## 2016-04-19 MED ORDER — LORAZEPAM 1 MG PO TABS: 1.0000 mg | ORAL_TABLET | Freq: Four times a day (QID) | ORAL | Status: AC | PRN

## 2016-04-19 MED ORDER — FOLIC ACID 1 MG PO TABS
1.0000 mg | ORAL_TABLET | Freq: Every day | ORAL | Status: DC
  Administered 2016-04-19 – 2016-04-22 (×4): 1 mg via ORAL
  Filled 2016-04-19 (×4): qty 1

## 2016-04-19 MED ORDER — ONDANSETRON HCL 4 MG/2ML IJ SOLN: 4.0000 mg | Freq: Four times a day (QID) | INTRAMUSCULAR | Status: DC | PRN

## 2016-04-19 MED ORDER — VITAMIN B-1 100 MG PO TABS
100.0000 mg | ORAL_TABLET | Freq: Every day | ORAL | Status: DC
  Administered 2016-04-19 – 2016-04-22 (×4): 100 mg via ORAL
  Filled 2016-04-19 (×4): qty 1

## 2016-04-19 MED ORDER — LOPERAMIDE HCL 2 MG PO CAPS
2.0000 mg | ORAL_CAPSULE | Freq: Once | ORAL | Status: AC
  Administered 2016-04-19: 2 mg via ORAL
  Filled 2016-04-19: qty 1

## 2016-04-19 MED ORDER — SODIUM CHLORIDE 0.9 % IV SOLN: INTRAVENOUS | Status: DC

## 2016-04-19 MED ORDER — FAMOTIDINE IN NACL 20-0.9 MG/50ML-% IV SOLN: 20.0000 mg | Freq: Two times a day (BID) | INTRAVENOUS | Status: DC

## 2016-04-19 MED ORDER — ADULT MULTIVITAMIN W/MINERALS CH
1.0000 | ORAL_TABLET | Freq: Every day | ORAL | Status: DC
  Administered 2016-04-19 – 2016-04-22 (×4): 1 via ORAL
  Filled 2016-04-19 (×4): qty 1

## 2016-04-19 MED ORDER — SUCRALFATE 1 GM/10ML PO SUSP
1.0000 g | Freq: Three times a day (TID) | ORAL | Status: DC
  Administered 2016-04-19 – 2016-04-22 (×13): 1 g via ORAL
  Filled 2016-04-19 (×15): qty 10

## 2016-04-19 MED ORDER — DEXTROSE-NACL 5-0.45 % IV SOLN
INTRAVENOUS | Status: DC
  Administered 2016-04-19: 09:00:00 via INTRAVENOUS
  Administered 2016-04-19 (×2): 75 mL via INTRAVENOUS
  Administered 2016-04-20 – 2016-04-22 (×3): via INTRAVENOUS

## 2016-04-19 MED ORDER — SODIUM CHLORIDE 0.9 % IV BOLUS (SEPSIS)
1000.0000 mL | Freq: Once | INTRAVENOUS | Status: AC
  Administered 2016-04-19: 1000 mL via INTRAVENOUS

## 2016-04-19 MED ORDER — DILTIAZEM HCL 100 MG IV SOLR
5.0000 mg/h | INTRAVENOUS | Status: DC
  Administered 2016-04-19 (×2): 10 mg/h via INTRAVENOUS
  Filled 2016-04-19: qty 100

## 2016-04-19 MED ORDER — METOPROLOL TARTRATE 25 MG PO TABS
25.0000 mg | ORAL_TABLET | Freq: Three times a day (TID) | ORAL | Status: DC
  Administered 2016-04-19 – 2016-04-22 (×10): 25 mg via ORAL
  Filled 2016-04-19 (×10): qty 1

## 2016-04-19 MED ORDER — LORAZEPAM 2 MG/ML IJ SOLN: 0.0000 mg | Freq: Two times a day (BID) | INTRAMUSCULAR | Status: DC

## 2016-04-19 MED ORDER — ZOLPIDEM TARTRATE 5 MG PO TABS
5.0000 mg | ORAL_TABLET | Freq: Every evening | ORAL | Status: DC | PRN
  Administered 2016-04-19 – 2016-04-21 (×4): 5 mg via ORAL
  Filled 2016-04-19 (×4): qty 1

## 2016-04-19 MED ORDER — ALUM & MAG HYDROXIDE-SIMETH 200-200-20 MG/5ML PO SUSP
30.0000 mL | Freq: Four times a day (QID) | ORAL | Status: DC | PRN
  Administered 2016-04-19: 30 mL via ORAL
  Filled 2016-04-19: qty 30

## 2016-04-19 MED ORDER — OXYCODONE HCL 5 MG PO TABS
5.0000 mg | ORAL_TABLET | Freq: Four times a day (QID) | ORAL | Status: DC | PRN
  Administered 2016-04-19 – 2016-04-22 (×7): 5 mg via ORAL
  Filled 2016-04-19 (×7): qty 1

## 2016-04-19 MED ORDER — LORAZEPAM 2 MG/ML IJ SOLN
1.0000 mg | Freq: Four times a day (QID) | INTRAMUSCULAR | Status: AC | PRN
  Administered 2016-04-19: 1 mg via INTRAVENOUS

## 2016-04-19 MED ORDER — HYDROXYZINE HCL 50 MG/ML IM SOLN
25.0000 mg | Freq: Four times a day (QID) | INTRAMUSCULAR | Status: DC | PRN
  Filled 2016-04-19: qty 0.5

## 2016-04-19 MED ORDER — LORAZEPAM 2 MG/ML IJ SOLN
0.0000 mg | Freq: Four times a day (QID) | INTRAMUSCULAR | Status: AC
  Administered 2016-04-20: 1 mg via INTRAVENOUS
  Filled 2016-04-19 (×2): qty 1

## 2016-04-19 MED ORDER — METOPROLOL TARTRATE 50 MG PO TABS
75.0000 mg | ORAL_TABLET | Freq: Two times a day (BID) | ORAL | Status: DC
  Administered 2016-04-19: 75 mg via ORAL
  Filled 2016-04-19: qty 1

## 2016-04-19 MED ORDER — NITROGLYCERIN 0.4 MG SL SUBL: 0.4000 mg | SUBLINGUAL_TABLET | SUBLINGUAL | Status: DC | PRN

## 2016-04-19 NOTE — Progress Notes (Signed)
Md made aware of Lactic acid 3.7, trop 0.05, PT/INR 21.6/1.89. UDS + Benzo. Will continue to monitor. Saunders Revel T

## 2016-04-19 NOTE — Consult Note (Addendum)
CARDIOLOGY CONSULT NOTE   Patient ID: Nathan Frank MRN: [id number] DOB/AGE: May 08, 1941 75 y.o.  Admit date: 04/18/2016  Primary Physician   Thressa Sheller, MD Primary Cardiologist   Dr. Harrington Challenger Reason for Consultation   SVT/Hypotension Requesting Physician  Dr. Ree Kida  HPI: Nathan Frank is a 74 y.o. male with a history of SVT, HTN, prolonged QT, RBBB, hepatitis C, cirrhosis, GIB, ulcerative gastritis, alcohol abuse, prior hx of heroin abuse and marijuana abuse and medication non compliance who presented to ER 04/18/16 for evaluation of nausea, vomiting and diarrhea along with chest pain.  12/2014 admitted with GI bleed related to ETOH, NSAIDs and gastritis and had SVT which was managed medically. He did at that time have a prolonged QT that was treated with mag. Echo demonstrated an EF of 50% without other acute abnormalities.  04/2015 admitted with chest pain/NSTEMI. Peak troponin 7.72. UDS was negative. He underwent cath 04/15/15 showing widely patent coronaries. The right coronary, LAD, ramus, and circumflex are very tortuous. He had low normal to mildly depressed LV systolic function with an estimated EF of 45-50% with mildly elevated LVEDP. It was felt that his NSTEMI was possibly due to coronary spasm versus embolus, stress cardiomyopathy (atypical), and aborted infarction related to plaque rupture with thrombosis and dissolution. He was started low dose amlodipine to help with possible spasm. Dr. Harrington Challenger recommended no ASA or Plavix due to cirrhosis and GIB history. His statin was also stopped due to lack of CAD on cath and risks given underlying liver disease.   12/02/15 seen in ER for SVT. No response with adenosine x 4. However converted on IV beta blocker therapy. He was not taking his metoprolol at that time.  He was presented to [facility] ER [facility]/18/69 with a 3 day history of diarrhea, black stool and abdominal pain. 2 day history of nausea and vomiting. He describes  vomiting as a brown color. Also admits to having generalized weakness for the past 3 days. Yesterday all day patient felt heart racing and had sharp substernal chest pain. No associated shortness of breath or diaphoresis. The patient denies orthopnea, PND, syncope, dizziness, lower extremity edema. He states that he is not compliant with his medication. Hasn't to beta blocker for many weeks.  In ER patient was found to have a lactic acid elevated to 7.52 with blood pressure of 84/68. Improved with 2 L normal saline. Stool occult blood negative. TSH 4.6. RUQ US showed cholelithiasis and cirrhosis with hepatic steatosis.  CT angiography abdomen showed no evidence of bile ischemia, and evidence of  cirrhosis and portal hypertension. EKG shows SVT/aflutter at rate of 172 bpm. He was started on IV Cardizem and IV Lopressor. Going in and out of sinus rhythm overnight. Maintaining sinus rhythm since midnight. Rate controlled to 60-70s. Prolonged QT.   Past Medical History  Diagnosis Date  . Neuropathy ([place])   . Essential hypertension   . Hepatitis C   . Cirrhosis ([place]) 06/04/2012  . Alcohol abuse   . GERD (gastroesophageal reflux disease)   . GI bleed due to NSAIDs 12/13/2014  . Gallstones   . Fatty liver   . Heroin abuse     "I haven't done that since I don't know when."  . Granulomatous gastritis   . Polysubstance abuse     Rare marijuana.  No EtOH x 2 months.   . SVT (supraventricular tachycardia) ([place])     a. 12/2014 in setting of GIB, ETOH, NSAIDS, gastritis.  . Prolonged  Q-T interval on ECG     a. 12/2014 - treated with magnesium.  . NSTEMI (non-ST elevated myocardial infarction) ([place])     a. 04/2015 - patent coronaries. Etiology possibly due to coronary spasm versus embolus, stress cardiomyopathy (atypical), and aborted infarction related to plaque rupture with thrombosis and dissolution. Amlodipine started. Not on antiplatelets due to GIB/cirrhosis history.  . Abnormal TSH   . LV dysfunction       a. 04/2015: EF 45-50% by cath.  . Anemia   . Thrombocytopenia ([place])   . DVT of lower limb, acute ([place]) 06/06/2012  . Dizziness and giddiness 12/13/2014  . Acute gastric ulcer   . Acute gastritis with hemorrhage   . Alcohol dependence ([place]) 02/02/2014  . Depressive disorder 02/01/2014  . Gastritis 06/07/2012  . Hematuria 06/04/2012  . Heroin overdose 02/20/2014  . Oral thrush 06/05/2012  . Right knee pain 12/13/2014  . S/P alcohol detoxification 02/02/2014  . Symptomatic cholelithiasis 12/15/2013  . Upper GI bleeding 12/13/2014  . Weight loss 06/04/2012     Past Surgical History  Procedure Laterality Date  . Esophagogastroduodenoscopy  06/07/2012    Procedure: ESOPHAGOGASTRODUODENOSCOPY (EGD);  Surgeon: Milus Banister, MD;  Location: [place];  Service: Endoscopy;  Laterality: N/A;  may need treatment of varices  . Tonsillectomy    . Esophagogastroduodenoscopy N/A 12/14/2014    Procedure: ESOPHAGOGASTRODUODENOSCOPY (EGD);  Surgeon: Jerene Bears, MD;  Location: [facility] ENDOSCOPY;  Service: Endoscopy;  Laterality: N/A;  . Circumcision    . Cardiac catheterization N/A 04/15/2015    Procedure: Left Heart Cath and Coronary Angiography;  Surgeon: Belva Crome, MD;  Location: [place] CV LAB;  Service: Cardiovascular;  Laterality: N/A;    No Known Allergies  I have reviewed the patient's current medications . folic acid  1 mg Oral Daily  . LORazepam  0-4 mg Intravenous Q6H   Followed by  . [START ON 04/21/2016] LORazepam  0-4 mg Intravenous Q12H  . metoprolol tartrate  75 mg Oral BID  . multivitamin with minerals  1 tablet Oral Daily  . sucralfate  1 g Oral TID WC & HS  . thiamine  100 mg Oral Daily   Or  . thiamine  100 mg Intravenous Daily   . dextrose 5 % and 0.45% NaCl 75 mL (04/19/16 0135)  . diltiazem (CARDIZEM) infusion 10 mg/hr (04/19/16 0136)   alum & mag hydroxide-simeth, hydrOXYzine, LORazepam **OR** LORazepam, nitroGLYCERIN, oxyCODONE, zolpidem  Prior to Admission medications    Medication Sig Start Date End Date Taking? Authorizing Provider  lisinopril (PRINIVIL,ZESTRIL) 2.5 MG tablet Take 1 tablet (2.5 mg total) by mouth daily. 12/13/15   Fay Records, MD  metoprolol tartrate (LOPRESSOR) 25 MG tablet TAKE 3 TABLETS BY MOUTH TWICE A DAY 03/31/16   Rhonda G Barrett, PA-C  metoprolol tartrate (LOPRESSOR) 25 MG tablet Take 75 mg by mouth 2 (two) times daily. 03/03/16   Historical Provider, MD  omeprazole (PRILOSEC) 40 MG capsule Take 1 capsule (40 mg total) by mouth daily. For acid reflux 12/15/14   Thurnell Lose, MD  spironolactone (ALDACTONE) 25 MG tablet Take 1 tablet (25 mg total) by mouth daily. 12/24/15   Fay Records, MD     Social History   Social History  . Marital Status: Married    Spouse Name: N/A  . Number of Children: 3  . Years of Education: N/A   Occupational History  . Retired Education officer, [employer]    Social History Main Topics  .  Smoking status: Former Smoker -- 0.50 packs/day for 5 years    Types: Cigarettes  . Smokeless tobacco: Never Used     Comment: "quit smoking cigarettes in the 1970's"  . Alcohol Use: 16.8 oz/week    28 Glasses of wine per week     Comment: drinks 1 pint of wine/day   . Drug Use: Yes    Special: Heroin  . Sexual Activity: Not Currently    Birth Control/ Protection: Condom   Other Topics Concern  . Not on file   Social History Narrative   Lost one son to a gunshot.  Lives with wife.      Family Status  Relation Status Death Age  . Father Deceased   . Brother Deceased   . Maternal Grandfather Deceased   . Maternal Grandmother Deceased   . Paternal Grandmother Deceased   . Paternal Grandfather Deceased    Family History  Problem Relation Age of Onset  . Stroke Father   . Prostate cancer Brother   . Heart disease Mother     Pacemaker     ROS:  Full 14 point review of systems complete and found to be negative unless listed above.  Physical Exam: Blood pressure 100/64, pulse 65, temperature 97.6 F (36.4  C), temperature source Oral, resp. rate 17, height 6' (1.829 m), weight 205 lb 14.6 oz (93.4 kg), SpO2 100 %.  General: Well developed, well nourished, male in no acute distress Head: Eyes PERRLA, No xanthomas. Normocephalic and atraumatic, oropharynx without edema or exudate.  Lungs: Resp regular and unlabored, CTA. Heart: RRR no s3, s4, or murmurs..   Neck: No carotid bruits. No lymphadenopathy. No  JVD. Abdomen: Bowel sounds present, abdomen soft and non-tender without masses or hernias noted. Msk:  No spine or cva tenderness. No weakness, no joint deformities or effusions. Extremities: No clubbing, cyanosis or edema. DP/PT/Radials 2+ and equal bilaterally. Neuro: Alert and oriented X 3. No focal deficits noted. Psych:  Good affect, responds appropriately Skin: No rashes or lesions noted.  Labs:   Lab Results  Component Value Date   WBC 6.0 04/18/2016   HGB 14.5 04/18/2016   HCT 41.7 04/18/2016   MCV 103.5* 04/18/2016   PLT 142* 04/18/2016    Recent Labs  04/19/16 0212  INR 1.89*    Recent Labs Lab 04/18/16 1855  NA 137  K 4.3  CL 102  CO2 20*  BUN <5*  CREATININE 0.86  CALCIUM 8.1*  PROT 6.2*  BILITOT 2.1*  ALKPHOS 118  ALT 77*  AST 252*  GLUCOSE 114*  ALBUMIN 2.1*    Recent Labs  04/19/16 0212 04/19/16 0548  TROPONINI 0.05* 0.06*    Recent Labs  04/18/16 1906  TROPIPOC 0.04   No results found for: PROBNP Lab Results  Component Value Date   CHOL 77 04/19/2016   HDL 11* 04/19/2016   LDLCALC 36 04/19/2016   TRIG 151* 04/19/2016   No results found for: [facility] LIPASE  Date/Time Value Ref Range Status  04/18/2016 06:55 PM 21 11 - 51 U/L Final   TSH  Date/Time Value Ref Range Status  04/19/2016 02:12 AM 4.630* 0.350 - 4.500 uIU/mL Final  12/14/2014 05:52 AM 5.410* 0.350 - 4.500 uIU/mL Final    Echo: 12/14/2014 LV EF: 50%  ------------------------------------------------------------------- History:  PMH: SVT. Risk factors: Alcohol  abuse. Cirrhosis. Heroin use. Hepatitis C.  ------------------------------------------------------------------- Study Conclusions  - Left ventricle: The cavity size was normal. Wall thickness was normal. The estimated ejection  fraction was 50%. Diffuse hypokinesis. Features are consistent with a pseudonormal left ventricular filling pattern, with concomitant abnormal relaxation and increased filling pressure (grade 2 diastolic dysfunction). - Aortic valve: There was no stenosis. - Mitral valve: There was trivial regurgitation. - Left atrium: The atrium was mildly to moderately dilated. - Right ventricle: The cavity size was normal. Systolic function was normal. - Tricuspid valve: Peak RV-RA gradient (S): 32 mm Hg. - Pulmonary arteries: PA peak pressure: 35 mm Hg (S). - Inferior vena cava: The vessel was normal in size. The respirophasic diameter changes were in the normal range (>= 50%), consistent with normal central venous pressure.  Impressions:  - Technically difficult study with poor acoustic windows. Normal LV size with EF 50%, diffuse mild hypokinesis. Moderate diastolic dysfunction. Normal RV size and systolic function. No significant valvular abnormalities.  ECG: Vent. rate 90 BPM PR interval 112 ms QRS duration 116 ms QT/QTc 414/506 ms P-R-T axes -31 -38 -33  Radiology:  Dg Chest Port 1 View  04/18/2016  CLINICAL DATA:  75 year old male with chest pain and vomiting for 3 days. EXAM: PORTABLE CHEST 1 VIEW COMPARISON:  12/02/2015 and prior radiographs FINDINGS: Cardiomegaly again noted. There is no evidence of focal airspace disease, pulmonary edema, suspicious pulmonary nodule/mass, pleural effusion, or pneumothorax. No acute bony abnormalities are identified. Remote left rib fractures are noted. IMPRESSION: Cardiomegaly without evidence of acute cardiopulmonary disease. Electronically Signed   By: Margarette Canada M.D.   On: 04/18/2016 19:55   Ct Angio  Abd/pel W/ And/or W/o  04/18/2016  CLINICAL DATA:  75 year old male with upper GI bleed concern for mesenteric ischemia. EXAM: CTA ABDOMEN AND PELVIS wITHOUT AND WITH CONTRAST TECHNIQUE: Multidetector CT imaging of the abdomen and pelvis was performed using the standard protocol during bolus administration of intravenous contrast. Multiplanar reconstructed images and MIPs were obtained and reviewed to evaluate the vascular anatomy. CONTRAST:  100 cc Isovue 370 COMPARISON:  Abdominal CT dated 12/12/2013 and ultrasound dated 12/12/2013 and 01/07/2016. FINDINGS: The visualized lung bases are clear. No intra-abdominal free air or free fluid. There is fatty infiltration of the liver with advanced cirrhosis and fibrotic changes. Focal lesion or focal enhancement identified. There multiple stones within the gallbladder. No pericholecystic fluid. Ultrasound may provide better evaluation of the gallbladder if clinically indicated. The pancreas, spleen, adrenal glands appear unremarkable. There is no hydronephrosis on either side. The visualized ureters and urinary bladder appear unremarkable. The prostate and seminal vesicles are grossly unremarkable. Evaluation of the bowel is limited in the absence of oral contrast. There is no evidence of bowel obstruction or active inflammation. There is minimal prominence of the tip of the appendix. The appendix is otherwise unremarkable without inflammatory changes. There is mild aortoiliac atherosclerotic disease. There is no aortic aneurysm or evidence of dissection. The origins of the celiac axis, SMA, IMA as well as the origins of the renal arteries appear patent. There is a classic celiac axis branching anatomy. No portal venous gas identified. First the SMV is, splenic vein, and main portal vein are patent. 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 There is no adenopathy. The abdominal wall soft tissues appear unremarkable. There is degenerative  changes of the spine. No acute fracture. Review of the MIP images confirms the above findings. IMPRESSION: No CT evidence of bowel ischemia as clinically concerned. Advanced cirrhosis with its type chronic changes of the liver and fatty infiltration. No discrete focal mass identified. Evidence of portal hypertension with gastric and paraesophageal  varices prone to upper GI bleed. Cholelithiasis. No evidence of bowel obstruction or active inflammation. Normal appendix. Electronically Signed   By: Anner Crete M.D.   On: 04/18/2016 20:53   US Abdomen Limited Ruq  04/19/2016  CLINICAL DATA:  Initial evaluation for acute abdominal pain. Gallstones on prior CT. EXAM: US ABDOMEN LIMITED - RIGHT UPPER QUADRANT COMPARISON:  Prior CT from 04/18/2016. FINDINGS: Gallbladder: Multiple echogenic stones present within the gallbladder lumen. Gallbladder wall measured within normal limits at 2.4 mm. No free pericholecystic fluid. Sonographic Percell Miller sign was elicited on exam. Common bile duct: Diameter: 4.4 mm Liver: Extremely limited evaluation on this exam. Diffuse hepatic steatosis suspected. Known cirrhotic changes present. No focal mass. IMPRESSION: 1. Cholelithiasis with positive sonographic Murphy's sign. No other sonographic features indicative of acute cholecystitis. No biliary dilatation. 2. Cirrhosis with hepatic steatosis, better evaluated on recent CT. Electronically Signed   By: Jeannine Boga M.D.   On: 04/19/2016 00:56    ASSESSMENT AND PLAN:     1. Sinus arrhythmia - Patient has a history of SVT. Noncompliant with beta blocker. Upon presentation his heart rate was 172 with hypotension. EKG shows SVT, however started on IV Cardizem and IV beta blocker. Going in and out of sinus rhythm overnight. Currently maintaining sinus rhythm at a controlled rate. Will  discontinuation of IV Cardizem. Continue by PO metoprolol --> change to 25mg  TID.  He is not a good candidate for anticoagulation given history  of GI bleed, ongoing alcohol abuse and liver cirrhosis with evidence of portal hypertension on CT angiogram during this admission.  2. [place] systolic CHF  - Cath in July 2016 showed normal coronary arteries,  LVEF was 45 to 50%. Pending echocardiogram this admission.  Likely tachycardia mediated cardiomyopathy. Continue BB. Resume lisinopril and spironolace once improve BP.   3. Chest pain/ minimally elevated troponin  - Likely tachycardia mediated. Avoid aspirin.  4. Ongoing tobacco abuse - Encouraged cessation.    SignedLeanor Kail, PA 04/19/2016, 8:17 AM Pager [phone]  Co-Sign MD  Patient seen with PA, agree with the above note.  He has history of SVT and HCV/ETOH cirrhosis.  He was admitted by the hospitalist service for diarrhea, nausea, vomiting, abdominal pain.  Workup is ongoing.  He was noted to be in SVT with HR in 170s and hypotensive initially.  He had stopped his home metoprolol a while back.  He was started on metoprolol again along with diltiazem gtt. He is currently back in NSR.  1. SVT: He has baseline RBBB, noted to have SVT with RBBB morphology with rate around 170.  It does not look atrial flutter.  Has had this in the past.  Likely triggered by medical illness this time.  Now back in what appears to be an ectopic atrial rhythm at good rate.   - Can taper off diltiazem gtt.  - Given soft BP, will start back on metoprolol 25 mg every 8 hrs for now.  Transition over to Toprol XL if BP remains stable.  - Would consider SVT ablation given concern that he will stop his meds in the future.  2. Elevated troponin: Minimal. Suspect demand ischemia with hypotension and SVT.  Would get echocardiogram.   Loralie Champagne 04/19/2016 9:07 AM

## 2016-04-19 NOTE — ED Notes (Signed)
Attempted report x1. 

## 2016-04-19 NOTE — Progress Notes (Addendum)
PROGRESS NOTE    Nathan Frank  [id number] DOB: 09/16/1941 DOA: 04/18/2016 PCP: Thressa Sheller, MD   Brief Narrative:  On 04/18/2016 by Dr. Ivor Costa Nathan Frank is a 74 y.o. male with medical history significant of atrial fibrillation not on anticoagulation due to hx of GI bleeding, liver cirrhosis, HCV, alcohol abuse, heroin abuse, DVT, gastric ulcer, GI bleeding, CAD, medication noncompliance, who presents with chest pain, nausea, vomiting, diarrhea, abdominal pain, black stool and generalized weakness Patient reports that he has been having generalized weeks for 3 days. He has chest pain. It is located in the substernal area, constant, 8 out of 10 in severity, nonradiating. It is not aggravated or deviated any known factors. He does not have cough, shortness of breath fever or chills. He also reports nausea, vomiting, diarrhea, abdominal pain and black stool. He states that he vomited twice yesterday, no vomiting today. He has watery bowel movement, 5-6 times each day in the past 2 days. He is not taking laxatives. He has diffused and constant abdominal pain, which is 8 out of 10 in severity, nonradiating. It is not aggravated or alleviated by any known factors. He reports that his stool is black. He is not taking iron supplements. Patient states that he has dysuria sometimes, no burning on urination or urinary frequency. He does not have unilateral weakness. Assessment & Plan   Sinus arrhythmia/SVT -Upon presentation, HR was in the 170s, ?Atrial flutter 2:1. Placed on IV cardizem and metoprolol -Appears to be in sinus rhythm at this time -Patient stopped taking his metoprolol approximately one week ago -Suspect this is secondary to nausea/vomiting/diarrhea -Cardiology consulted and appreciated -Wean off of cardizem, started on metoprolol 25mg  TID -May need ablation in the future as patient may stop taking his meds in the future -TSH 4.63, obtain FT4 -Patient not a candidate for  anticoagulation given history of GI bleed and ongoing alcohol use with liver cirrhosis  Possible sepsis -?secondary to GI source -Upon admission, patient was tachypneic, tachycardic, with hypotension. Lactic acid 7.52 -GI pathogen panel pending -C. difficile negative -CT abdomen/pelvis: no ischemic bowel, no asicites -CXR and UA unremarkable for for infection -Continue IV fluids -Currently patient is afebrile with no leukocytosis. Will hold off on starting antibiotics  Nausea, vomiting, diarrhea, abdominal pain -Treatment plan as above. Continue supportive care and antiemetics -Question whether this is due to alcohol withdrawal  Lactic acidosis  -Etiology is not clear. Differential diagnosis include SIRS vs.sepsis, hypoperfusion secondary to hypotension and starvation due to alcohol abuse. -Improving, continue IV fluids  Hypotension -blood pressure 84/68 upon admission. Improving with IV fluids -Continue to monitor closely  History of gastric ulcer and upper GI bleeding  -FOBT negative -Hemoglobin is stable at 14.5.  -Continue to monitor CBC -Suspect hemoglobin will drop given the patient was hypotensive and is currently on IV fluids  Hepatitis C and Cirrhosis -Mental status is normal.  -Avoid medications with liver toxicity, such as atenolol -Cirrhosis evident on CT abdomen pelvis  Alcohol abuse -Did counseling about the importance of quitting drinking -Patient quit drinking 2 days ago -CIWA protocol  Abnormal TSH -TSH 4.63, obtain FT4  DVT Prophylaxis  SCD  Code Status: Full  Family Communication: None at bedside  Disposition Plan: Admitted to stepdown.  Consultants Cardiology  Procedures  None  Antibiotics   Anti-infectives    None      Subjective:   Nathan Frank seen and examined today.  Complains of abdominal pain, nausea and vomiting with diarrhea. Does  feel hungry. Denies any current chest pain or shortness of breath.  Objective:   Filed  Vitals:   04/19/16 0742 04/19/16 0800 04/19/16 0826 04/19/16 0900  BP: 100/64 90/64 90/62  99/70  Pulse: 65 66 69 68  Temp: 97.6 F (36.4 C)     TempSrc: Oral     Resp: 17 12 21 13   Height:      Weight:      SpO2: 100% 99% 99% 99%    Intake/Output Summary (Last 24 hours) at 04/19/16 1037 Last data filed at 04/19/16 0900  Gross per 24 hour  Intake 1305.25 ml  Output    160 ml  Net 1145.25 ml   Filed Weights   04/18/16 1827 04/19/16 0139  Weight: 81.647 kg (180 lb) 93.4 kg (205 lb 14.6 oz)    Exam  General: Well developed, well nourished, NAD, appears stated age  HEENT: NCAT, , mucous membranes moist.   Neck: Supple, no JVD, no masses  Cardiovascular: S1 S2 auscultated, no rubs, murmurs or gallops. Regular rate and rhythm.  Respiratory: Clear to auscultation bilaterally with equal chest rise  Abdomen: Soft, nontender, nondistended, + bowel sounds  Extremities: warm dry without cyanosis clubbing or edema  Neuro: AAOx3, nonfocal  Psych: Normal affect and demeanor   Data Reviewed: I have personally reviewed following labs and imaging studies  CBC:  Recent Labs Lab 04/18/16 1855  WBC 6.0  NEUTROABS 3.8  HGB 14.5  HCT 41.7  MCV 103.5*  PLT [id number]*   Basic Metabolic Panel:  Recent Labs Lab 04/18/16 1855  NA 137  K 4.3  CL 102  CO2 20*  GLUCOSE 114*  BUN <5*  CREATININE 0.86  CALCIUM 8.1*   GFR: Estimated Creatinine Clearance: 88.1 mL/min (by C-G formula based on Cr of 0.86). Liver Function Tests:  Recent Labs Lab 04/18/16 1855  AST 252*  ALT 77*  ALKPHOS 118  BILITOT 2.1*  PROT 6.2*  ALBUMIN 2.1*    Recent Labs Lab 04/18/16 1855  LIPASE 21   No results for input(s): AMMONIA in the last 168 hours. Coagulation Profile:  Recent Labs Lab 04/19/16 0212  INR 1.89*   Cardiac Enzymes:  Recent Labs Lab 04/19/16 0212 04/19/16 0548  TROPONINI 0.05* 0.06*   BNP (last 3 results) No results for input(s): PROBNP in the last 8760  hours. HbA1C: No results for input(s): HGBA1C in the last 72 hours. CBG: No results for input(s): GLUCAP in the last 168 hours. Lipid Profile:  Recent Labs  04/19/16 0212  CHOL 77  HDL 11*  LDLCALC 36  TRIG 151*  CHOLHDL 7.0   Thyroid Function Tests:  Recent Labs  04/19/16 0212  TSH 4.630*   Anemia Panel: No results for input(s): VITAMINB12, FOLATE, FERRITIN, TIBC, IRON, RETICCTPCT in the last 72 hours. Urine analysis:    Component Value Date/Time   COLORURINE AMBER* 04/19/2016 0237   APPEARANCEUR CLEAR 04/19/2016 0237   LABSPEC <1.005* 04/19/2016 0237   PHURINE 6.5 04/19/2016 0237   GLUCOSEU NEGATIVE 04/19/2016 0237   HGBUR NEGATIVE 04/19/2016 0237   BILIRUBINUR SMALL* 04/19/2016 0237   KETONESUR NEGATIVE 04/19/2016 0237   PROTEINUR NEGATIVE 04/19/2016 0237   UROBILINOGEN 2.0* 12/13/2014 1256   NITRITE NEGATIVE 04/19/2016 0237   LEUKOCYTESUR NEGATIVE 04/19/2016 0237   Sepsis Labs: @LABRCNTIP (procalcitonin:4,lacticidven:4)  ) Recent Results (from the past 240 hour(s))  MRSA PCR Screening     Status: None   Collection Time: 04/19/16  1:25 AM  Result Value Ref Range Status  MRSA by PCR NEGATIVE NEGATIVE Final    Comment:        The GeneXpert MRSA Assay (FDA approved for NASAL specimens only), is one component of a comprehensive MRSA colonization surveillance program. It is not intended to diagnose MRSA infection nor to guide or monitor treatment for MRSA infections.   C difficile quick scan w PCR reflex     Status: None   Collection Time: 04/19/16  5:32 AM  Result Value Ref Range Status   C Diff antigen NEGATIVE NEGATIVE Final   C Diff toxin NEGATIVE NEGATIVE Final   C Diff interpretation No C. difficile detected.  Final      Radiology Studies: Dg Chest Port 1 View  04/18/2016  CLINICAL DATA:  75 year old male with chest pain and vomiting for 3 days. EXAM: PORTABLE CHEST 1 VIEW COMPARISON:  12/02/2015 and prior radiographs FINDINGS: Cardiomegaly  again noted. There is no evidence of focal airspace disease, pulmonary edema, suspicious pulmonary nodule/mass, pleural effusion, or pneumothorax. No acute bony abnormalities are identified. Remote left rib fractures are noted. IMPRESSION: Cardiomegaly without evidence of acute cardiopulmonary disease. Electronically Signed   By: Margarette Canada M.D.   On: 04/18/2016 19:55   Ct Angio Abd/pel W/ And/or W/o  04/18/2016  CLINICAL DATA:  75 year old male with upper GI bleed concern for mesenteric ischemia. EXAM: CTA ABDOMEN AND PELVIS wITHOUT AND WITH CONTRAST TECHNIQUE: Multidetector CT imaging of the abdomen and pelvis was performed using the standard protocol during bolus administration of intravenous contrast. Multiplanar reconstructed images and MIPs were obtained and reviewed to evaluate the vascular anatomy. CONTRAST:  100 cc Isovue 370 COMPARISON:  Abdominal CT dated 12/12/2013 and ultrasound dated 12/12/2013 and 01/07/2016. FINDINGS: The visualized lung bases are clear. No intra-abdominal free air or free fluid. There is fatty infiltration of the liver with advanced cirrhosis and fibrotic changes. Focal lesion or focal enhancement identified. There multiple stones within the gallbladder. No pericholecystic fluid. Ultrasound may provide better evaluation of the gallbladder if clinically indicated. The pancreas, spleen, adrenal glands appear unremarkable. There is no hydronephrosis on either side. The visualized ureters and urinary bladder appear unremarkable. The prostate and seminal vesicles are grossly unremarkable. Evaluation of the bowel is limited in the absence of oral contrast. There is no evidence of bowel obstruction or active inflammation. There is minimal prominence of the tip of the appendix. The appendix is otherwise unremarkable without inflammatory changes. There is mild aortoiliac atherosclerotic disease. There is no aortic aneurysm or evidence of dissection. The origins of the celiac axis, SMA,  IMA as well as the origins of the renal arteries appear patent. There is a classic celiac axis branching anatomy. No portal venous gas identified. First the SMV is, splenic vein, and main portal vein are patent. 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 There is no adenopathy. The abdominal wall soft tissues appear unremarkable. There is degenerative changes of the spine. No acute fracture. Review of the MIP images confirms the above findings. IMPRESSION: No CT evidence of bowel ischemia as clinically concerned. Advanced cirrhosis with its type chronic changes of the liver and fatty infiltration. No discrete focal mass identified. 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 Cholelithiasis. No evidence of bowel obstruction or active inflammation. Normal appendix. Electronically Signed   By: Anner Crete M.D.   On: 04/18/2016 20:53   US Abdomen Limited Ruq  04/19/2016  CLINICAL DATA:  Initial evaluation for acute abdominal pain. Gallstones on  prior CT. EXAM: US ABDOMEN LIMITED - RIGHT UPPER QUADRANT COMPARISON:  Prior CT from 04/18/2016. FINDINGS: Gallbladder: Multiple echogenic stones present within the gallbladder lumen. Gallbladder wall measured within normal limits at 2.4 mm. No free pericholecystic fluid. Sonographic Percell Miller sign was elicited on exam. Common bile duct: Diameter: 4.4 mm Liver: Extremely limited evaluation on this exam. Diffuse hepatic steatosis suspected. Known cirrhotic changes present. No focal mass. IMPRESSION: 1. Cholelithiasis with positive sonographic Murphy's sign. No other sonographic features indicative of acute cholecystitis. No biliary dilatation. 2. Cirrhosis with hepatic steatosis, better evaluated on recent CT. Electronically Signed   By: Jeannine Boga M.D.   On: 04/19/2016 00:56     Scheduled Meds: . folic acid  1 mg Oral Daily  . LORazepam  0-4 mg Intravenous Q6H   Followed by   . [START ON 04/21/2016] LORazepam  0-4 mg Intravenous Q12H  . metoprolol tartrate  25 mg Oral TID  . multivitamin with minerals  1 tablet Oral Daily  . sucralfate  1 g Oral TID WC & HS  . thiamine  100 mg Oral Daily   Or  . thiamine  100 mg Intravenous Daily   Continuous Infusions: . dextrose 5 % and 0.45% NaCl 75 mL/hr at 04/19/16 0830     LOS: 1 day   Time Spent in minutes   40 minutes  Deion Swift D.O. on 04/19/2016 at 10:37 AM  Between 7am to 7pm - Pager - [phone]  After 7pm go to [email or web address] - password [email or web address]  And look for the night coverage person covering for me after hours  [facility] Office  [phone]

## 2016-04-20 ENCOUNTER — Inpatient Hospital Stay (HOSPITAL_COMMUNITY): Payer: Medicare Other

## 2016-04-20 DIAGNOSIS — R000 Tachycardia, unspecified: Secondary | ICD-10-CM

## 2016-04-20 DIAGNOSIS — R079 Chest pain, unspecified: Secondary | ICD-10-CM

## 2016-04-20 LAB — BASIC METABOLIC PANEL
ANION GAP: 5 (ref 5–15)
BUN: 5 mg/dL — AB (ref 6–20)
CHLORIDE: 108 mmol/L (ref 101–111)
CO2: 21 mmol/L — ABNORMAL LOW (ref 22–32)
Calcium: 7.1 mg/dL — ABNORMAL LOW (ref 8.9–10.3)
Creatinine, Ser: 0.85 mg/dL (ref 0.61–1.24)
GFR calc Af Amer: >60 mL/min (ref >60)
Glucose, Bld: 106 mg/dL — ABNORMAL HIGH (ref 65–99)
POTASSIUM: 2.8 mmol/L — AB (ref 3.5–5.1)
SODIUM: 134 mmol/L — AB (ref 135–145)

## 2016-04-20 LAB — CBC
HCT: 36.2 % — ABNORMAL LOW (ref 39.0–52.0)
HEMOGLOBIN: 12.5 g/dL — AB (ref 13.0–17.0)
MCH: 36.4 pg — AB (ref 26.0–34.0)
MCHC: 34.5 g/dL (ref 30.0–36.0)
MCV: 105.5 fL — AB (ref 78.0–100.0)
PLATELETS: 100 10*3/uL — AB (ref 150–400)
RBC: 3.43 MIL/uL — AB (ref 4.22–5.81)
RDW: 18.1 % — ABNORMAL HIGH (ref 11.5–15.5)
WBC: 5.2 10*3/uL (ref 4.0–10.5)

## 2016-04-20 LAB — ECHOCARDIOGRAM COMPLETE
Height: 72 in
Weight: 3389.79 oz

## 2016-04-20 LAB — LACTIC ACID, PLASMA: Lactic Acid, Venous: 1.7 mmol/L (ref 0.5–1.9)

## 2016-04-20 LAB — HEMOGLOBIN A1C
Hgb A1c MFr Bld: 4.6 % — ABNORMAL LOW (ref 4.8–5.6)
Mean Plasma Glucose: 85 mg/dL

## 2016-04-20 LAB — MAGNESIUM: MAGNESIUM: 1.2 mg/dL — AB (ref 1.7–2.4)

## 2016-04-20 LAB — T4, FREE: FREE T4: 1.09 ng/dL (ref 0.61–1.12)

## 2016-04-20 MED ORDER — LOPERAMIDE HCL 2 MG PO CAPS
2.0000 mg | ORAL_CAPSULE | ORAL | Status: DC | PRN
  Administered 2016-04-21 – 2016-04-22 (×2): 2 mg via ORAL
  Filled 2016-04-20 (×2): qty 1

## 2016-04-20 MED ORDER — POTASSIUM CHLORIDE CRYS ER 20 MEQ PO TBCR
40.0000 meq | EXTENDED_RELEASE_TABLET | Freq: Two times a day (BID) | ORAL | Status: AC
  Administered 2016-04-20 (×2): 40 meq via ORAL
  Filled 2016-04-20 (×2): qty 2

## 2016-04-20 MED ORDER — MAGNESIUM SULFATE 2 GM/50ML IV SOLN
2.0000 g | Freq: Once | INTRAVENOUS | Status: AC
  Administered 2016-04-20: 2 g via INTRAVENOUS
  Filled 2016-04-20: qty 50

## 2016-04-20 NOTE — Progress Notes (Signed)
  Echocardiogram 2D Echocardiogram has been performed.  Nathan Frank 04/20/2016, 9:38 AM

## 2016-04-20 NOTE — Progress Notes (Signed)
Md notified of potassium of 2.8 Saunders Revel T

## 2016-04-20 NOTE — Progress Notes (Signed)
Subjective: Breathing is OK  No CP  Still with abdominal discomfort   Objective: Filed Vitals:   04/19/16 2300 04/20/16 0300 04/20/16 0513 04/20/16 0818  BP: 97/66 106/66  121/96  Pulse:  65 72 85  Temp: 98.2 F (36.8 C) 97.6 F (36.4 C)  98 F (36.7 C)  TempSrc: Oral Oral  Oral  Resp: 24 25  17   Height:      Weight:  211 lb 13.8 oz (96.1 kg)    SpO2: 98% 96%  100%   Weight change: 31 lb 13.8 oz (14.452 kg)  Intake/Output Summary (Last 24 hours) at 04/20/16 0858 Last data filed at 04/20/16 0600  Gross per 24 hour  Intake 1896.25 ml  Output    600 ml  Net 1296.25 ml   I/O  NEt 2.6 L    General: Alert, awake, oriented x3, in no acute distress Neck:  JVP is normal Heart: Regular rate and rhythm, without murmurs, rubs, gallops.  Lungs: Clear to auscultation.  No rales or wheezes. Abdomen  Mild diffuse tenderness  Exemities:  No edema.   Neuro: Grossly intact, nonfocal.  Tele:  SR   Lab Results: Results for orders placed or performed during the hospital encounter of 04/18/16 (from the past 24 hour(s))  Troponin I (q 6hr x 3)     Status: Abnormal   Collection Time: 04/19/16 12:16 PM  Result Value Ref Range   Troponin I 0.08 (HH) <0.03 ng/mL  CBC     Status: Abnormal   Collection Time: 04/20/16  3:53 AM  Result Value Ref Range   WBC 5.2 4.0 - 10.5 K/uL   RBC 3.43 (L) 4.22 - 5.81 MIL/uL   Hemoglobin 12.5 (L) 13.0 - 17.0 g/dL   HCT 36.2 (L) 39.0 - 52.0 %   MCV 105.5 (H) 78.0 - 100.0 fL   MCH 36.4 (H) 26.0 - 34.0 pg   MCHC 34.5 30.0 - 36.0 g/dL   RDW 18.1 (H) 11.5 - 15.5 %   Platelets 100 (L) 150 - 400 K/uL  Basic metabolic panel     Status: Abnormal   Collection Time: 04/20/16  3:53 AM  Result Value Ref Range   Sodium 134 (L) 135 - 145 mmol/L   Potassium 2.8 (L) 3.5 - 5.1 mmol/L   Chloride 108 101 - 111 mmol/L   CO2 21 (L) 22 - 32 mmol/L   Glucose, Bld 106 (H) 65 - 99 mg/dL   BUN 5 (L) 6 - 20 mg/dL   Creatinine, Ser 0.85 0.61 - 1.24 mg/dL   Calcium 7.1 (L)  8.9 - 10.3 mg/dL   GFR calc non Af Amer >60 >60 mL/min   GFR calc Af Amer >60 >60 mL/min   Anion gap 5 5 - 15  Magnesium     Status: Abnormal   Collection Time: 04/20/16  7:58 AM  Result Value Ref Range   Magnesium 1.2 (L) 1.7 - 2.4 mg/dL  Lactic acid, plasma     Status: None   Collection Time: 04/20/16  7:58 AM  Result Value Ref Range   Lactic Acid, Venous 1.7 0.5 - 1.9 mmol/L    Studies/Results: No results found.  Medications: Reviewed   @PROBHOSP @  1  SVT  Tele With short bursts of PAT but no sustatined recurrence  Would keep on metoprolol  IV fluids for now.  Replete K and MG  2  Chronic systolic CHF   Cath last July Normal arteries (in setting of peak trop 7.72)  LVEF 45 to 50%  Echo this am just being done  OVerall, think LV function looks a little better     No new recomm.  Keep on IV fluids for now until taking po.     LOS: 2 days   [name] 04/20/2016, 8:58 AM

## 2016-04-20 NOTE — Progress Notes (Signed)
Sent text page to Dr. Ree Kida with magnesium result - 1.2

## 2016-04-20 NOTE — Progress Notes (Addendum)
PROGRESS NOTE    [name]  [id number] DOB: [birth year] DOA: 04/18/2016 PCP: Thressa Sheller, MD   Brief Narrative:  On 04/18/2016 by Dr. Ivor Costa Nathan Frank is a 75 y.o. male with medical history significant of atrial fibrillation not on anticoagulation due to hx of GI bleeding, liver cirrhosis, HCV, alcohol abuse, heroin abuse, DVT, gastric ulcer, GI bleeding, CAD, medication noncompliance, who presents with chest pain, nausea, vomiting, diarrhea, abdominal pain, black stool and generalized weakness Patient reports that he has been having generalized weeks for 3 days. He has chest pain. It is located in the substernal area, constant, 8 out of 10 in severity, nonradiating. It is not aggravated or deviated any known factors. He does not have cough, shortness of breath fever or chills. He also reports nausea, vomiting, diarrhea, abdominal pain and black stool. He states that he vomited twice yesterday, no vomiting today. He has watery bowel movement, 5-6 times each day in the past 2 days. He is not taking laxatives. He has diffused and constant abdominal pain, which is 8 out of 10 in severity, nonradiating. It is not aggravated or alleviated by any known factors. He reports that his stool is black. He is not taking iron supplements. Patient states that he has dysuria sometimes, no burning on urination or urinary frequency. He does not have unilateral weakness. Assessment & Plan   Sinus arrhythmia/SVT -Upon presentation, HR was in the 170s, ?Atrial flutter 2:1. Placed on IV cardizem and metoprolol -Appears to be in sinus rhythm at this time -Patient stopped taking his metoprolol approximately one week ago -Suspect this is secondary to nausea/vomiting/diarrhea -Cardiology consulted and appreciated -Wean off of cardizem, started on metoprolol 25mg  TID -May need ablation in the future as patient may stop taking his meds in the future -TSH 4.63, obtain FT4 -Patient not a candidate for  anticoagulation given history of GI bleed and ongoing alcohol use with liver cirrhosis -Echocardiogram pending  Possible sepsis -?secondary to GI source -Upon admission, patient was tachypneic, tachycardic, with hypotension. Lactic acid 7.52 -GI pathogen and C. difficile negative -CT abdomen/pelvis: no ischemic bowel, no asicites -CXR and UA unremarkable for for infection -Continue IV fluids -Currently patient is afebrile with no leukocytosis. Will hold off on starting antibiotics  Nausea, vomiting, diarrhea, abdominal pain -Treatment plan as above. Continue supportive care and antiemetics -Question whether this is due to alcohol withdrawal -Per patient, improving mildly. Wishes to eat.  Lactic acidosis  -Resolved, Etiology is not clear. Differential diagnosis include SIRS vs.sepsis, hypoperfusion secondary to hypotension and starvation due to alcohol abuse.  Hypotension -blood pressure 84/68 upon admission. Improving with IV fluids -Continue to monitor closely  History of gastric ulcer and upper GI bleeding  -FOBT negative -Hemoglobin is stable at 12.5.  -Continue to monitor CBC -Suspect hemoglobin will drop given the patient was hypotensive and is currently on IV fluids  Hepatitis C and Cirrhosis -Mental status is normal.  -Avoid medications with liver toxicity, such as atenolol -Cirrhosis evident on CT abdomen pelvis  Alcohol abuse -Did counseling about the importance of quitting drinking -Patient quit drinking prior to admission -CIWA protocol  Abnormal TSH -TSH 4.63, FT4 pending  Hypokalemia and hypomagnesemia -Possibly due to GI losses -We will replace and continue to monitor  DVT Prophylaxis  SCD  Code Status: Full  Family Communication: None at bedside  Disposition Plan: Admitted to stepdown.  Consultants Cardiology  Procedures  None  Antibiotics   Anti-infectives    None  Subjective:   Nathan Frank seen and examined today.  He is to  have the abdominal discomfort. Feels his nausea and vomiting and diarrhea have improved. Patient wishes T food. Currently denies any chest pain feels her shortness of breath has improved as well.  Objective:   Filed Vitals:   04/20/16 0800 04/20/16 0818 04/20/16 0900 04/20/16 1000  BP:  121/96    Pulse: 81 85 78 75  Temp:  98 F (36.7 C)    TempSrc:  Oral    Resp: 25 17 17 20   Height:      Weight:      SpO2: 97% 100% 94% 100%    Intake/Output Summary (Last 24 hours) at 04/20/16 1210 Last data filed at 04/20/16 1000  Gross per 24 hour  Intake   2130 ml  Output    850 ml  Net   1280 ml   Filed Weights   04/18/16 1827 04/19/16 0139 04/20/16 0300  Weight: 81.647 kg (180 lb) 93.4 kg (205 lb 14.6 oz) 96.1 kg (211 lb 13.8 oz)    Exam  General: Well developed, well nourished, NAD  HEENT: NCAT, , mucous membranes moist.   Cardiovascular: S1 S2 auscultated, no rubs, murmurs or gallops. Regular rate and rhythm.  Respiratory: Clear to auscultation bilaterally with equal chest rise  Abdomen: Soft, nontender, nondistended, + bowel sounds  Extremities: warm dry without cyanosis clubbing or edema  Neuro: AAOx3, nonfocal  Psych: Propria-affect  Data Reviewed: I have personally reviewed following labs and imaging studies  CBC:  Recent Labs Lab 04/18/16 1855 04/20/16 0353  WBC 6.0 5.2  NEUTROABS 3.8  --   HGB 14.5 12.5*  HCT 41.7 36.2*  MCV 103.5* 105.5*  PLT 142* 123XX123*   Basic Metabolic Panel:  Recent Labs Lab 04/18/16 1855 04/20/16 0353 04/20/16 0758  NA 137 134*  --   K 4.3 2.8*  --   CL 102 108  --   CO2 20* 21*  --   GLUCOSE 114* 106*  --   BUN <5* 5*  --   CREATININE 0.86 0.85  --   CALCIUM 8.1* 7.1*  --   MG  --   --  1.2*   GFR: Estimated Creatinine Clearance: 90.3 mL/min (by C-G formula based on Cr of 0.85). Liver Function Tests:  Recent Labs Lab 04/18/16 1855  AST 252*  ALT 77*  ALKPHOS 118  BILITOT 2.1*  PROT 6.2*  ALBUMIN 2.1*     Recent Labs Lab 04/18/16 1855  LIPASE 21   No results for input(s): AMMONIA in the last 168 hours. Coagulation Profile:  Recent Labs Lab 04/19/16 0212  INR 1.89*   Cardiac Enzymes:  Recent Labs Lab 04/19/16 0212 04/19/16 0548 04/19/16 1216  TROPONINI 0.05* 0.06* 0.08*   BNP (last 3 results) No results for input(s): PROBNP in the last 8760 hours. HbA1C:  Recent Labs  04/19/16 0212  HGBA1C 4.6*   CBG: No results for input(s): GLUCAP in the last 168 hours. Lipid Profile:  Recent Labs  04/19/16 0212  CHOL 77  HDL 11*  LDLCALC 36  TRIG 151*  CHOLHDL 7.0   Thyroid Function Tests:  Recent Labs  04/19/16 0212  TSH 4.630*   Anemia Panel: No results for input(s): VITAMINB12, FOLATE, FERRITIN, TIBC, IRON, RETICCTPCT in the last 72 hours. Urine analysis:    Component Value Date/Time   COLORURINE AMBER* 04/19/2016 0237   APPEARANCEUR CLEAR 04/19/2016 0237   LABSPEC <1.005* 04/19/2016 0237   PHURINE 6.5  04/19/2016 [place] 04/19/2016 0237   HGBUR NEGATIVE 04/19/2016 0237   BILIRUBINUR SMALL* 04/19/2016 0237   KETONESUR NEGATIVE 04/19/2016 0237   PROTEINUR NEGATIVE 04/19/2016 0237   UROBILINOGEN 2.0* 12/13/2014 1256   NITRITE NEGATIVE 04/19/2016 0237   LEUKOCYTESUR NEGATIVE 04/19/2016 0237   Sepsis Labs: @LABRCNTIP (procalcitonin:4,lacticidven:4)  ) Recent Results (from the past 240 hour(s))  MRSA PCR Screening     Status: None   Collection Time: 04/19/16  1:25 AM  Result Value Ref Range Status   MRSA by PCR NEGATIVE NEGATIVE Final    Comment:        The GeneXpert MRSA Assay (FDA approved for NASAL specimens only), is one component of a comprehensive MRSA colonization surveillance program. It is not intended to diagnose MRSA infection nor to guide or monitor treatment for MRSA infections.   C difficile quick scan w PCR reflex     Status: None   Collection Time: 04/19/16  5:32 AM  Result Value Ref Range Status   C Diff  antigen NEGATIVE NEGATIVE Final   C Diff toxin NEGATIVE NEGATIVE Final   C Diff interpretation No C. difficile detected.  Final  Gastrointestinal Panel by PCR , Stool     Status: None   Collection Time: 04/19/16  5:32 AM  Result Value Ref Range Status   Campylobacter species NOT DETECTED NOT DETECTED Final   Plesimonas shigelloides NOT DETECTED NOT DETECTED Final   Salmonella species NOT DETECTED NOT DETECTED Final   Yersinia enterocolitica NOT DETECTED NOT DETECTED Final   Vibrio species NOT DETECTED NOT DETECTED Final   Vibrio cholerae NOT DETECTED NOT DETECTED Final   Enteroaggregative E coli (EAEC) NOT DETECTED NOT DETECTED Final   Enteropathogenic E coli (EPEC) NOT DETECTED NOT DETECTED Final   Enterotoxigenic E coli (ETEC) NOT DETECTED NOT DETECTED Final   Shiga like toxin producing E coli (STEC) NOT DETECTED NOT DETECTED Final   E. coli O157 NOT DETECTED NOT DETECTED Final   Shigella/Enteroinvasive E coli (EIEC) NOT DETECTED NOT DETECTED Final   Cryptosporidium NOT DETECTED NOT DETECTED Final   Cyclospora cayetanensis NOT DETECTED NOT DETECTED Final   Entamoeba histolytica NOT DETECTED NOT DETECTED Final   Giardia lamblia NOT DETECTED NOT DETECTED Final   Adenovirus F40/41 NOT DETECTED NOT DETECTED Final   Astrovirus NOT DETECTED NOT DETECTED Final   Norovirus GI/GII NOT DETECTED NOT DETECTED Final   Rotavirus A NOT DETECTED NOT DETECTED Final   Sapovirus (I, II, IV, and V) NOT DETECTED NOT DETECTED Final      Radiology Studies: Dg Chest Port 1 View  04/18/2016  CLINICAL DATA:  75 year old male with chest pain and vomiting for 3 days. EXAM: PORTABLE CHEST 1 VIEW COMPARISON:  12/02/2015 and prior radiographs FINDINGS: Cardiomegaly again noted. There is no evidence of focal airspace disease, pulmonary edema, suspicious pulmonary nodule/mass, pleural effusion, or pneumothorax. No acute bony abnormalities are identified. Remote left rib fractures are noted. IMPRESSION: Cardiomegaly  without evidence of acute cardiopulmonary disease. Electronically Signed   By: Margarette Canada M.D.   On: 04/18/2016 19:55   Ct Angio Abd/pel W/ And/or W/o  04/18/2016  CLINICAL DATA:  75 year old male with upper GI bleed concern for mesenteric ischemia. EXAM: CTA ABDOMEN AND PELVIS wITHOUT AND WITH CONTRAST TECHNIQUE: Multidetector CT imaging of the abdomen and pelvis was performed using the standard protocol during bolus administration of intravenous contrast. Multiplanar reconstructed images and MIPs were obtained and reviewed to evaluate the vascular anatomy. CONTRAST:  100 cc  Isovue 370 COMPARISON:  Abdominal CT dated 12/12/2013 and ultrasound dated 12/12/2013 and 01/07/2016. FINDINGS: The visualized lung bases are clear. No intra-abdominal free air or free fluid. There is fatty infiltration of the liver with advanced cirrhosis and fibrotic changes. Focal lesion or focal enhancement identified. There multiple stones within the gallbladder. No pericholecystic fluid. Ultrasound may provide better evaluation of the gallbladder if clinically indicated. The pancreas, spleen, adrenal glands appear unremarkable. There is no hydronephrosis on either side. The visualized ureters and urinary bladder appear unremarkable. The prostate and seminal vesicles are grossly unremarkable. Evaluation of the bowel is limited in the absence of oral contrast. There is no evidence of bowel obstruction or active inflammation. There is minimal prominence of the tip of the appendix. The appendix is otherwise unremarkable without inflammatory changes. There is mild aortoiliac atherosclerotic disease. There is no aortic aneurysm or evidence of dissection. The origins of the celiac axis, SMA, IMA as well as the origins of the renal arteries appear patent. There is a classic celiac axis branching anatomy. No portal venous gas identified. First the SMV is, splenic vein, and main portal vein are patent. Upper abdominal varices including gastric  and paraesophageal varices noted. There is no adenopathy. The abdominal wall soft tissues appear unremarkable. There is degenerative changes of the spine. No acute fracture. Review of the MIP images confirms the above findings. IMPRESSION: No CT evidence of bowel ischemia as clinically concerned. Advanced cirrhosis with its type chronic changes of the liver and fatty infiltration. No discrete focal mass identified. 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 Cholelithiasis. No evidence of bowel obstruction or active inflammation. Normal appendix. Electronically Signed   By: Anner Crete M.D.   On: 04/18/2016 20:53   US Abdomen Limited Ruq  04/19/2016  CLINICAL DATA:  Initial evaluation for acute abdominal pain. Gallstones on prior CT. EXAM: US ABDOMEN LIMITED - RIGHT UPPER QUADRANT COMPARISON:  Prior CT from 04/18/2016. FINDINGS: Gallbladder: Multiple echogenic stones present within the gallbladder lumen. Gallbladder wall measured within normal limits at 2.4 mm. No free pericholecystic fluid. Sonographic Percell Miller sign was elicited on exam. Common bile duct: Diameter: 4.4 mm Liver: Extremely limited evaluation on this exam. Diffuse hepatic steatosis suspected. Known cirrhotic changes present. No focal mass. IMPRESSION: 1. Cholelithiasis with positive sonographic Murphy's sign. No other sonographic features indicative of acute cholecystitis. No biliary dilatation. 2. Cirrhosis with hepatic steatosis, better evaluated on recent CT. Electronically Signed   By: Jeannine Boga M.D.   On: 04/19/2016 00:56     Scheduled Meds: . folic acid  1 mg Oral Daily  . LORazepam  0-4 mg Intravenous Q6H   Followed by  . [START ON 04/21/2016] LORazepam  0-4 mg Intravenous Q12H  . metoprolol tartrate  25 mg Oral TID  . multivitamin with minerals  1 tablet Oral Daily  . potassium chloride  40 mEq Oral BID  . sucralfate  1 g Oral TID WC & HS  . thiamine  100 mg  Oral Daily   Or  . thiamine  100 mg Intravenous Daily   Continuous Infusions: . dextrose 5 % and 0.45% NaCl 75 mL/hr at 04/20/16 1100     LOS: 2 days   Time Spent in minutes   40 minutes  Lesli Issa D.O. on 04/20/2016 at 12:10 PM  Between 7am to 7pm - Pager - [phone]  After 7pm go to [email or web address] - password [email or web address]  And look for the night coverage person  covering for me after hours  [facility] Office  [phone]

## 2016-04-20 NOTE — Care Management Important Message (Signed)
Important Message  Patient Details  Name: Nathan Frank MRN: [id number] Date of Birth: 03/05/1941   Medicare Important Message Given:  Yes    Loann Quill 04/20/2016, 2:52 PM

## 2016-04-21 DIAGNOSIS — I4892 Unspecified atrial flutter: Secondary | ICD-10-CM

## 2016-04-21 LAB — BASIC METABOLIC PANEL
ANION GAP: 9 (ref 5–15)
BUN: <5 mg/dL — ABNORMAL LOW (ref 6–20)
CO2: 18 mmol/L — AB (ref 22–32)
Calcium: 7.3 mg/dL — ABNORMAL LOW (ref 8.9–10.3)
Chloride: 107 mmol/L (ref 101–111)
Creatinine, Ser: 0.83 mg/dL (ref 0.61–1.24)
GFR calc Af Amer: >60 mL/min (ref >60)
GLUCOSE: 117 mg/dL — AB (ref 65–99)
POTASSIUM: 4.1 mmol/L (ref 3.5–5.1)
Sodium: 134 mmol/L — ABNORMAL LOW (ref 135–145)

## 2016-04-21 LAB — CBC
HEMATOCRIT: 36.9 % — AB (ref 39.0–52.0)
Hemoglobin: 12.9 g/dL — ABNORMAL LOW (ref 13.0–17.0)
MCH: 37.4 pg — ABNORMAL HIGH (ref 26.0–34.0)
MCHC: 35 g/dL (ref 30.0–36.0)
MCV: 107 fL — AB (ref 78.0–100.0)
Platelets: 119 10*3/uL — ABNORMAL LOW (ref 150–400)
RBC: 3.45 MIL/uL — AB (ref 4.22–5.81)
RDW: 18.5 % — ABNORMAL HIGH (ref 11.5–15.5)
WBC: 5.8 10*3/uL (ref 4.0–10.5)

## 2016-04-21 MED ORDER — THIAMINE HCL 100 MG PO TABS: 100.0000 mg | ORAL_TABLET | Freq: Every day | ORAL | Status: DC

## 2016-04-21 MED ORDER — ADULT MULTIVITAMIN W/MINERALS CH: 1.0000 | ORAL_TABLET | Freq: Every day | ORAL | Status: DC

## 2016-04-21 MED ORDER — FOLIC ACID 1 MG PO TABS: 1.0000 mg | ORAL_TABLET | Freq: Every day | ORAL | Status: DC

## 2016-04-21 MED ORDER — METOPROLOL TARTRATE 25 MG PO TABS: 25.0000 mg | ORAL_TABLET | Freq: Three times a day (TID) | ORAL | Status: DC

## 2016-04-21 MED ORDER — AMIODARONE HCL 200 MG PO TABS
200.0000 mg | ORAL_TABLET | Freq: Two times a day (BID) | ORAL | Status: DC
  Administered 2016-04-21 – 2016-04-22 (×3): 200 mg via ORAL
  Filled 2016-04-21 (×3): qty 1

## 2016-04-21 NOTE — Consult Note (Signed)
Reason for Consult: recurrent SVT  Referring Physician: Dr. Chevis Pretty is an 75 y.o. male.   HPI: the patient is a 75 yo man who was admitted with a diarrheal illness and found to have recurrent episodes of SVT. He does not have palpitations and cannot tell that he is in SVT. The episodes start and stop suddenly. He has not had syncope. He has been difficult to treat with AV nodal blocking drugs because he has chronically low blood pressure. Review of his tele strips demonstrates that he has both SVT and atrial tachy vs atrial fib. 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 At other times, he has an irregular tachycardia, c/w atrial fib. No anginal symptoms. He is not thought to be an anti-coagulation candidate.  PMH: Past Medical History  Diagnosis Date  . Neuropathy ([place])   . Essential hypertension   . Hepatitis C   . Cirrhosis ([place]) 06/04/2012  . Alcohol abuse   . GERD (gastroesophageal reflux disease)   . GI bleed due to NSAIDs 12/13/2014  . Gallstones   . Fatty liver   . Heroin abuse     "I haven't done that since I don't know when."  . Granulomatous gastritis   . Polysubstance abuse     Rare marijuana.  No EtOH x 2 months.   . SVT (supraventricular tachycardia) ([place])     a. 12/2014 in setting of GIB, ETOH, NSAIDS, gastritis.  . Prolonged Q-T interval on ECG     a. 12/2014 - treated with magnesium.  . NSTEMI (non-ST elevated myocardial infarction) ([place])     a. 04/2015 - patent coronaries. Etiology possibly due to coronary spasm versus embolus, stress cardiomyopathy (atypical), and aborted infarction related to plaque rupture with thrombosis and dissolution. Amlodipine started. Not on antiplatelets due to GIB/cirrhosis history.  . Abnormal TSH   . LV dysfunction     a. 04/2015: EF 45-50% by cath.  . Anemia   . Thrombocytopenia ([place])   . DVT of lower limb, acute ([place]) 06/06/2012  . Dizziness and giddiness 12/13/2014  . Acute gastric ulcer     . Acute gastritis with hemorrhage   . Alcohol dependence ([place]) 02/02/2014  . Depressive disorder 02/01/2014  . Gastritis 06/07/2012  . Hematuria 06/04/2012  . Heroin overdose 02/20/2014  . Oral thrush 06/05/2012  . Right knee pain 12/13/2014  . S/P alcohol detoxification 02/02/2014  . Symptomatic cholelithiasis 12/15/2013  . Upper GI bleeding 12/13/2014  . Weight loss 06/04/2012    PSHX: Past Surgical History  Procedure Laterality Date  . Esophagogastroduodenoscopy  06/07/2012    Procedure: ESOPHAGOGASTRODUODENOSCOPY (EGD);  Surgeon: Milus Banister, MD;  Location: [facility];  Service: Endoscopy;  Laterality: N/A;  may need treatment of varices  . Tonsillectomy    . Esophagogastroduodenoscopy N/A 12/14/2014    Procedure: ESOPHAGOGASTRODUODENOSCOPY (EGD);  Surgeon: Jerene Bears, MD;  Location: [facility] ENDOSCOPY;  Service: Endoscopy;  Laterality: N/A;  . Circumcision    . Cardiac catheterization N/A 04/15/2015    Procedure: Left Heart Cath and Coronary Angiography;  Surgeon: Belva Crome, MD;  Location: [facility] CV LAB;  Service: Cardiovascular;  Laterality: N/A;    FAMHX: Family History  Problem Relation Age of Onset  . Stroke Father   . Prostate cancer Brother   . Heart disease Mother     Pacemaker    Social History:  reports that he has quit smoking. His smoking use  included Cigarettes. He has a 2.5 pack-year smoking history. He has never used smokeless tobacco. He reports that he drinks about 16.8 oz of alcohol per week. He reports that he uses illicit drugs (Heroin).  Allergies: No Known Allergies  Medications: I have reviewed the patient's current medications.  No results found.  ROS  As stated in the HPI and negative for all other systems. No palpitations. No chest pain. Dyspnea is minimal.  Physical Exam  Vitals:Blood pressure 81/62, pulse 80, temperature 97.5 F (36.4 C), temperature source Oral, resp. rate 15, height 6' (1.829 m), weight 211 lb 13.8 oz (96.1 kg), SpO2 100  %.  diskempt appearing NAD HEENT: Unremarkable Neck:  6 cm JVD, no thyromegally Lymphatics:  No adenopathy Back:  No CVA tenderness Lungs:  Clear with no weezes HEART:  Regular rate rhythm, no murmurs, no rubs, no clicks Abd:  soft, positive bowel sounds, no organomegally, no rebound, no guarding Ext:  2 plus pulses, no edema, no cyanosis, no clubbing Skin:  No rashes no nodules Neuro:  CN II through XII intact, motor grossly intact  Tele - NSR alternating with SVT and atrial fib  cxr - reviewed  Assessment/Plan: 1. 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 Probable atrial fib - he is not a candidate for anti-coagulation.  3. Probable atrial tachycardia - with all of above, I think amiodarone, best option. He is non-compliant. Hopefully he will take the amio and followup in clinic. 4. Hypotension - hopefully this will improve once he is on amiodarone and HR better and diarrhea resolved.  Cristopher Peru, M.D.  Carleene Overlie TaylorMD 04/21/2016, 1:47 PM

## 2016-04-21 NOTE — Progress Notes (Signed)
PROGRESS NOTE    Nathan Frank  [id number] DOB: 1941/09/25 DOA: 04/18/2016 PCP: Thressa Sheller, MD   Brief Narrative:  On 04/18/2016 by Dr. Ivor Costa Nathan Frank is a 75 y.o. male with medical history significant of atrial fibrillation not on anticoagulation due to hx of GI bleeding, liver cirrhosis, HCV, alcohol abuse, heroin abuse, DVT, gastric ulcer, GI bleeding, CAD, medication noncompliance, who presents with chest pain, nausea, vomiting, diarrhea, abdominal pain, black stool and generalized weakness Patient reports that he has been having generalized weeks for 3 days. He has chest pain. It is located in the substernal area, constant, 8 out of 10 in severity, nonradiating. It is not aggravated or deviated any known factors. He does not have cough, shortness of breath fever or chills. He also reports nausea, vomiting, diarrhea, abdominal pain and black stool. He states that he vomited twice yesterday, no vomiting today. He has watery bowel movement, 5-6 times each day in the past 2 days. He is not taking laxatives. He has diffused and constant abdominal pain, which is 8 out of 10 in severity, nonradiating. It is not aggravated or alleviated by any known factors. He reports that his stool is black. He is not taking iron supplements. Patient states that he has dysuria sometimes, no burning on urination or urinary frequency. He does not have unilateral weakness.  Found to have SVT. Cardio/EP consulted. Has has very soft BP.   Assessment & Plan   Sinus arrhythmia/SVT -Upon presentation, HR was in the 170s, ?Atrial flutter 2:1. Placed on IV cardizem and metoprolol -Appears to be in sinus rhythm at this time -Patient stopped taking his metoprolol approximately one week ago -Suspect this is secondary to nausea/vomiting/diarrhea -Cardiology consulted and appreciated -Wean off of cardizem, started on metoprolol 25mg  TID -May need ablation in the future as patient may stop taking his meds  in the future -TSH 4.63, FT4 1.09 -Patient not a candidate for anticoagulation given history of GI bleed and ongoing alcohol use with liver cirrhosis -Echocardiogram 0000000, grade 2 diastolic dysfunction  Possible sepsis vs SIRS -?secondary to GI source -Upon admission, patient was tachypneic, tachycardic, with hypotension. Lactic acid 7.52, improved to 1.7 -GI pathogen and C. difficile negative -CT abdomen/pelvis: no ischemic bowel, no asicites -CXR and UA unremarkable for for infection -Continue IV fluids -Currently patient is afebrile with no leukocytosis. Will hold off on starting antibiotics  Nausea, vomiting, diarrhea, abdominal pain -Resolved -Treatment plan as above. Continue supportive care and antiemetics -Question whether this is due to alcohol withdrawal  Lactic acidosis  -Resolved, Etiology is not clear. Differential diagnosis include SIRS vs.sepsis, hypoperfusion secondary to hypotension and starvation due to alcohol abuse.  Hypotension -blood pressure 84/68 upon admission. Improving with IV fluids -Continue to monitor closely  History of gastric ulcer and upper GI bleeding  -FOBT negative -Hemoglobin is stable at 12.9.  -Continue to monitor CBC  Hepatitis C and Cirrhosis -Mental status is normal.  -Avoid medications with liver toxicity, such as atenolol -Cirrhosis evident on CT abdomen pelvis  Alcohol abuse -Did counseling about the importance of quitting drinking -Patient quit drinking prior to admission -CIWA protocol  Abnormal TSH -TSH 4.63, FT4 1.09  Hypokalemia and hypomagnesemia -Possibly due to GI losses -Replaced. Continue to monitor and replace as needed  DVT Prophylaxis  SCD  Code Status: Full  Family Communication: None at bedside  Disposition Plan: Admitted to stepdown. Pending EP consult  Consultants Cardiology/EP  Procedures  Echocardiogram  Antibiotics   Anti-infectives  None      Subjective:   Nathan Frank seen  and examined today.  Patient states abdominal pain is better, has less diarrhea.  Denies nausea or vomiting.  States he needs to go home as he has bills to pay. Currently denies any chest pain feels her shortness of breath has improved as well.  Objective:   Filed Vitals:   04/21/16 0738 04/21/16 0922 04/21/16 1133 04/21/16 1210  BP: 98/80 94/68 82/60  81/62  Pulse: 102 112 80 80  Temp: 98.3 F (36.8 C)  97.5 F (36.4 C)   TempSrc: Oral  Oral   Resp: 19  19 15   Height:      Weight:      SpO2: 100%  100% 100%    Intake/Output Summary (Last 24 hours) at 04/21/16 1232 Last data filed at 04/21/16 1148  Gross per 24 hour  Intake   2050 ml  Output    600 ml  Net   1450 ml   Filed Weights   04/18/16 1827 04/19/16 0139 04/20/16 0300  Weight: 81.647 kg (180 lb) 93.4 kg (205 lb 14.6 oz) 96.1 kg (211 lb 13.8 oz)    Exam  General: Well developed, well nourished, NAD  HEENT: NCAT, , mucous membranes moist.   Cardiovascular: S1 S2 auscultated, no murmurs, RRR  Respiratory: Clear to auscultation bilaterally with equal chest rise  Abdomen: Soft, nontender, nondistended, + bowel sounds  Extremities: warm dry without cyanosis clubbing. Trace LE edema.  Neuro: AAOx3, nonfocal  Psych: Appropriate mood and affect  Data Reviewed: I have personally reviewed following labs and imaging studies  CBC:  Recent Labs Lab 04/18/16 1855 04/20/16 0353 04/21/16 0320  WBC 6.0 5.2 5.8  NEUTROABS 3.8  --   --   HGB 14.5 12.5* 12.9*  HCT 41.7 36.2* 36.9*  MCV 103.5* 105.5* 107.0*  PLT 142* 100* 123456*   Basic Metabolic Panel:  Recent Labs Lab 04/18/16 1855 04/20/16 0353 04/20/16 0758 04/21/16 0320  NA 137 134*  --  134*  K 4.3 2.8*  --  4.1  CL 102 108  --  107  CO2 20* 21*  --  18*  GLUCOSE 114* 106*  --  117*  BUN <5* 5*  --  <5*  CREATININE 0.86 0.85  --  0.83  CALCIUM 8.1* 7.1*  --  7.3*  MG  --   --  1.2*  --    GFR: Estimated Creatinine Clearance: 92.5 mL/min (by C-G  formula based on Cr of 0.83). Liver Function Tests:  Recent Labs Lab 04/18/16 1855  AST 252*  ALT 77*  ALKPHOS 118  BILITOT 2.1*  PROT 6.2*  ALBUMIN 2.1*    Recent Labs Lab 04/18/16 1855  LIPASE 21   No results for input(s): AMMONIA in the last 168 hours. Coagulation Profile:  Recent Labs Lab 04/19/16 0212  INR 1.89*   Cardiac Enzymes:  Recent Labs Lab 04/19/16 0212 04/19/16 0548 04/19/16 1216  TROPONINI 0.05* 0.06* 0.08*   BNP (last 3 results) No results for input(s): PROBNP in the last 8760 hours. HbA1C:  Recent Labs  04/19/16 0212  HGBA1C 4.6*   CBG: No results for input(s): GLUCAP in the last 168 hours. Lipid Profile:  Recent Labs  04/19/16 0212  CHOL 77  HDL 11*  LDLCALC 36  TRIG 151*  CHOLHDL 7.0   Thyroid Function Tests:  Recent Labs  04/19/16 0212 04/20/16 1438  TSH 4.630*  --   FREET4  --  1.09  Anemia Panel: No results for input(s): VITAMINB12, FOLATE, FERRITIN, TIBC, IRON, RETICCTPCT in the last 72 hours. Urine analysis:    Component Value Date/Time   COLORURINE AMBER* 04/19/2016 0237   APPEARANCEUR CLEAR 04/19/2016 0237   LABSPEC <1.005* 04/19/2016 0237   PHURINE 6.5 04/19/2016 0237   GLUCOSEU NEGATIVE 04/19/2016 0237   HGBUR NEGATIVE 04/19/2016 0237   BILIRUBINUR SMALL* 04/19/2016 0237   KETONESUR NEGATIVE 04/19/2016 0237   PROTEINUR NEGATIVE 04/19/2016 0237   UROBILINOGEN 2.0* 12/13/2014 1256   NITRITE NEGATIVE 04/19/2016 0237   LEUKOCYTESUR NEGATIVE 04/19/2016 0237   Sepsis Labs: @LABRCNTIP (procalcitonin:4,lacticidven:4)  ) Recent Results (from the past 240 hour(s))  MRSA PCR Screening     Status: None   Collection Time: 04/19/16  1:25 AM  Result Value Ref Range Status   MRSA by PCR NEGATIVE NEGATIVE Final    Comment:        The GeneXpert MRSA Assay (FDA approved for NASAL specimens only), is one component of a comprehensive MRSA colonization surveillance program. It is not intended to diagnose  MRSA infection nor to guide or monitor treatment for MRSA infections.   C difficile quick scan w PCR reflex     Status: None   Collection Time: 04/19/16  5:32 AM  Result Value Ref Range Status   C Diff antigen NEGATIVE NEGATIVE Final   C Diff toxin NEGATIVE NEGATIVE Final   C Diff interpretation No C. difficile detected.  Final  Gastrointestinal Panel by PCR , Stool     Status: None   Collection Time: 04/19/16  5:32 AM  Result Value Ref Range Status   Campylobacter species NOT DETECTED NOT DETECTED Final   Plesimonas shigelloides NOT DETECTED NOT DETECTED Final   Salmonella species NOT DETECTED NOT DETECTED Final   Yersinia enterocolitica NOT DETECTED NOT DETECTED Final   Vibrio species NOT DETECTED NOT DETECTED Final   Vibrio cholerae NOT DETECTED NOT DETECTED Final   Enteroaggregative E coli (EAEC) NOT DETECTED NOT DETECTED Final   Enteropathogenic E coli (EPEC) NOT DETECTED NOT DETECTED Final   Enterotoxigenic E coli (ETEC) NOT DETECTED NOT DETECTED Final   Shiga like toxin producing E coli (STEC) NOT DETECTED NOT DETECTED Final   E. coli O157 NOT DETECTED NOT DETECTED Final   Shigella/Enteroinvasive E coli (EIEC) NOT DETECTED NOT DETECTED Final   Cryptosporidium NOT DETECTED NOT DETECTED Final   Cyclospora cayetanensis NOT DETECTED NOT DETECTED Final   Entamoeba histolytica NOT DETECTED NOT DETECTED Final   Giardia lamblia NOT DETECTED NOT DETECTED Final   Adenovirus F40/41 NOT DETECTED NOT DETECTED Final   Astrovirus NOT DETECTED NOT DETECTED Final   Norovirus GI/GII NOT DETECTED NOT DETECTED Final   Rotavirus A NOT DETECTED NOT DETECTED Final   Sapovirus (I, II, IV, and V) NOT DETECTED NOT DETECTED Final      Radiology Studies: No results found.   Scheduled Meds: . folic acid  1 mg Oral Daily  . LORazepam  0-4 mg Intravenous Q12H  . metoprolol tartrate  25 mg Oral TID  . multivitamin with minerals  1 tablet Oral Daily  . sucralfate  1 g Oral TID WC & HS  .  thiamine  100 mg Oral Daily   Continuous Infusions: . dextrose 5 % and 0.45% NaCl 75 mL/hr at 04/21/16 1100     LOS: 3 days   Time Spent in minutes   40 minutes  Tenna Lacko D.O. on 04/21/2016 at 12:32 PM  Between 7am to 7pm - Pager - [phone]  After 7pm go to [email or web address] - password [email or web address]  And look for the night coverage person covering for me after hours  [facility] Office  [phone]

## 2016-04-21 NOTE — Progress Notes (Signed)
   Subjective: Diarrhea improved  Denies dizzienss in bed  Breathiing is OK  " I need to pay my bills" Objective: Filed Vitals:   04/21/16 0412 04/21/16 0500 04/21/16 0738 04/21/16 0922  BP:   98/80 94/68  Pulse: 121 71 102 112  Temp: 98.2 F (36.8 C)  98.3 F (36.8 C)   TempSrc: Oral  Oral   Resp: 17 20 19    Height:      Weight:      SpO2: 100% 100% 100%    Weight change:   Intake/Output Summary (Last 24 hours) at 04/21/16 0938 Last data filed at 04/21/16 0600  Gross per 24 hour  Intake   1750 ml  Output    400 ml  Net   1350 ml    General: Alert, awake, oriented x3, in no acute distress Neck:  JVP is normal Heart: Regular rate and rhythm, without murmurs, rubs, gallops.  Lungs: Clear to auscultation.  No rales or wheezes. Exemities:  Tr edema.   Neuro: Grossly intact, nonfocal.  Tele:  SR and SVT  (120s ) Lab Results: Results for orders placed or performed during the hospital encounter of 04/18/16 (from the past 24 hour(s))  T4, free     Status: None   Collection Time: 04/20/16  2:38 PM  Result Value Ref Range   Free T4 1.09 0.61 - 1.12 ng/dL  CBC     Status: Abnormal   Collection Time: 04/21/16  3:20 AM  Result Value Ref Range   WBC 5.8 4.0 - 10.5 K/uL   RBC 3.45 (L) 4.22 - 5.81 MIL/uL   Hemoglobin 12.9 (L) 13.0 - 17.0 g/dL   HCT 36.9 (L) 39.0 - 52.0 %   MCV 107.0 (H) 78.0 - 100.0 fL   MCH 37.4 (H) 26.0 - 34.0 pg   MCHC 35.0 30.0 - 36.0 g/dL   RDW 18.5 (H) 11.5 - 15.5 %   Platelets 119 (L) 150 - 400 K/uL  Basic metabolic panel     Status: Abnormal   Collection Time: 04/21/16  3:20 AM  Result Value Ref Range   Sodium 134 (L) 135 - 145 mmol/L   Potassium 4.1 3.5 - 5.1 mmol/L   Chloride 107 101 - 111 mmol/L   CO2 18 (L) 22 - 32 mmol/L   Glucose, Bld 117 (H) 65 - 99 mg/dL   BUN <5 (L) 6 - 20 mg/dL   Creatinine, Ser 0.83 0.61 - 1.24 mg/dL   Calcium 7.3 (L) 8.9 - 10.3 mg/dL   GFR calc non Af Amer >60 >60 mL/min   GFR calc Af Amer >60 >60 mL/min   Anion  gap 9 5 - 15    Studies/Results: No results found.  Medications: REviewed    @PROBHOSP @  1  SVT  Pt has had a couple episodes of SVT that have lasted a few hours  BP is marginal   I have asked EP to see pt    2  Chronic systolic CHF  Echo yesterday LVEF normalized    LOS: 3 days   [name] 04/21/2016, 9:38 AM

## 2016-04-22 DIAGNOSIS — E872 Acidosis: Secondary | ICD-10-CM

## 2016-04-22 DIAGNOSIS — I9589 Other hypotension: Secondary | ICD-10-CM

## 2016-04-22 LAB — BASIC METABOLIC PANEL
ANION GAP: 6 (ref 5–15)
CO2: 20 mmol/L — AB (ref 22–32)
Calcium: 7.6 mg/dL — ABNORMAL LOW (ref 8.9–10.3)
Chloride: 110 mmol/L (ref 101–111)
Creatinine, Ser: 0.87 mg/dL (ref 0.61–1.24)
GFR calc Af Amer: >60 mL/min (ref >60)
GFR calc non Af Amer: >60 mL/min (ref >60)
GLUCOSE: 116 mg/dL — AB (ref 65–99)
POTASSIUM: 4.7 mmol/L (ref 3.5–5.1)
Sodium: 136 mmol/L (ref 135–145)

## 2016-04-22 LAB — CBC
HEMATOCRIT: 37.1 % — AB (ref 39.0–52.0)
Hemoglobin: 13 g/dL (ref 13.0–17.0)
MCH: 37.5 pg — AB (ref 26.0–34.0)
MCHC: 35 g/dL (ref 30.0–36.0)
MCV: 106.9 fL — AB (ref 78.0–100.0)
PLATELETS: 103 10*3/uL — AB (ref 150–400)
RBC: 3.47 MIL/uL — ABNORMAL LOW (ref 4.22–5.81)
RDW: 18.8 % — AB (ref 11.5–15.5)
WBC: 4.1 10*3/uL (ref 4.0–10.5)

## 2016-04-22 LAB — MAGNESIUM: Magnesium: 1.7 mg/dL (ref 1.7–2.4)

## 2016-04-22 MED ORDER — METOPROLOL TARTRATE 25 MG PO TABS: 25.0000 mg | ORAL_TABLET | Freq: Two times a day (BID) | ORAL | Status: DC

## 2016-04-22 MED ORDER — SUCRALFATE 1 G PO TABS: 1.0000 g | ORAL_TABLET | Freq: Three times a day (TID) | ORAL | Status: DC

## 2016-04-22 MED ORDER — AMIODARONE HCL 200 MG PO TABS: 200.0000 mg | ORAL_TABLET | Freq: Two times a day (BID) | ORAL | Status: DC

## 2016-04-22 NOTE — Care Management Important Message (Signed)
Important Message  Patient Details  Name: Nathan Frank MRN: [id number] Date of Birth: 09-13-1941   Medicare Important Message Given:  Yes    Loann Quill 04/22/2016, 10:02 AM

## 2016-04-22 NOTE — Progress Notes (Signed)
Patient has been discharged to home with family. All instructions have been reviewed and appointment has been highlighted and reviewed. Phone numbers are available for assistance. IV has been removed and patient was assisted in getting dressed. Will be transported to main entrance for grandson to pick up. All paperwork and home medications have been given to patient as well as cell phone. All belongings are accounted for.

## 2016-04-22 NOTE — Progress Notes (Signed)
Patient has been assigned a bed for transfer, awaiting call back from Dr. Harrington Challenger who has stated that she may discharge patient home instead. Have notified charge nurse.

## 2016-04-22 NOTE — Discharge Summary (Signed)
Physician Discharge Summary  LOMAX CENTOLA [id number] DOB: [birth year] DOA: 04/18/2016  PCP: Thressa Sheller, MD  Admit date: 04/18/2016 Discharge date: 04/22/2016  Time spent: 35 minutes  Recommendations for Outpatient Follow-up:  1. Please follow-up on blood pressures, he presented with issues with hypotension, metoprolol decreased to 25 mg by mouth twice a day rather than 3 times a day dosing 2. For SVT he was started on amiodarone 200 mg by mouth twice a day, will follow-up with cardiology on 05/05/2016    Discharge Diagnoses:  Principal Problem:   Atrial flutter with rapid ventricular response (HCC) Active Problems:   Hepatitis C   Cirrhosis (HCC)   Abdominal pain   Alcohol dependence (HCC)   Upper GI bleeding   Acute gastric ulcer   Prolonged Q-T interval on ECG   Essential hypertension   Chest pain   Hypotension   Elevated lactic acid level   Chronic atrial fibrillation ([place])   Discharge Condition: Stable/improved  Diet recommendation: Heart healthy  Filed Weights   04/18/16 1827 04/19/16 0139 04/20/16 0300  Weight: 81.647 kg (180 lb) 93.4 kg (205 lb 14.6 oz) 96.1 kg (211 lb 13.8 oz)    History of present illness:  Nathan Frank is a 75 y.o. male with medical history significant of atrial fibrillation not on anticoagulation due to hx of GI bleeding, liver cirrhosis, HCV, alcohol abuse, heroin abuse, DVT, gastric ulcer, GI bleeding, CAD, medication noncompliance, who presents with chest pain, nausea, vomiting, diarrhea, abdominal pain, black stool and generalized weakness  Patient reports that he has been having generalized weeks for 3 days. He has chest pain. It is located in the substernal area, constant, 8 out of 10 in severity, nonradiating. It is not aggravated or deviated any known factors. He does not have cough, shortness of breath fever or chills.  He also reports nausea, vomiting, diarrhea, abdominal pain and black stool. He states that he vomited  twice yesterday, no vomiting today. He has watery bowel movement, 5-6 times each day in the past 2 days. He is not taking laxatives. He has diffused and constant abdominal pain, which is 8 out of 10 in severity, nonradiating. It is not aggravated or alleviated by any known factors. He reports that his stool is black. He is not taking iron supplements. Patient states that he has dysuria sometimes, no burning on urination or urinary frequency. He does not have unilateral weakness.  Hospital Course:  [name] is a 75 year old gentleman with a past medical history of atrial fibrillation, history of alcohol abuse, cirrhosis, admitted to the medicine service on 04/18/2016 when he presented with complaints of nausea, vomiting, diarrhea, chest pain. On presentation he was found to be hypotensive with blood pressure of 84/68 improving with IV fluid challenge. He was admitted to the stepdown unit for close monitoring. Cardiology was consulted as he was found to be in supraventricular tachycardia. Initially he had been treated with IV Cardizem and metoprolol, converting to sinus rhythm. He admitted to not taking his metoprolol. On 04/22/2016 he reported feeling much better and asked to go home. Cardiology recommended amiodarone 200 mg by mouth twice a day with a decrease in his metoprolol dose to 25 mg by mouth twice a day. He was discharged in stable condition on 04/22/2016.  Sinus arrhythmia/SVT -Upon presentation, HR was in the 170s, ?Atrial flutter 2:1. Placed on IV cardizem and metoprolol -Appears to be in sinus rhythm at this time -Patient stopped taking his metoprolol approximately one week ago -Suspect this  is secondary to nausea/vomiting/diarrhea -Cardiology consulted and appreciated -TSH 4.63, FT4 1.09 -Patient not a candidate for anticoagulation given history of GI bleed and ongoing alcohol use with liver cirrhosis -Echocardiogram 0000000, grade 2 diastolic dysfunction -Cardiology recommended  amiodarone 200 mg by mouth twice a day with decrease in metoprolol to 25 mg twice a day dosing  Nausea, vomiting, diarrhea, abdominal pain -Resolved -Treatment plan as above. Continue supportive care and antiemetics -Question whether this is due to alcohol withdrawal  Lactic acidosis  -Resolved, Etiology is not clear. Differential diagnosis include SIRS vs.sepsis, hypoperfusion secondary to hypotension and starvation due to alcohol abuse.  Hypotension -blood pressure 84/68 upon admission. Improving with IV fluids  History of gastric ulcer and upper GI bleeding  -FOBT negative -Hemoglobin is stable at 12.9.  -Continue to monitor CBC  Procedures:  Transthoracic echocardiogram performed on 04/20/2016 impression: - Left ventricle: The cavity size was normal. Systolic function was  normal. The estimated ejection fraction was in the range of 55%  to 60%. Wall motion was normal; there were no regional wall  motion abnormalities. Features are consistent with a pseudonormal  left ventricular filling pattern, with concomitant abnormal  relaxation and increased filling pressure (grade 2 diastolic  dysfunction). Doppler parameters are consistent with elevated  ventricular end-diastolic filling pressure. - Aortic valve: Trileaflet; normal thickness leaflets. There was no  regurgitation. - Aortic root: The aortic root was normal in size. - Mitral valve: Structurally normal valve. There was mild  regurgitation. - Left atrium: The atrium was moderately dilated. - Right ventricle: The cavity size was normal. Wall thickness was  normal. Systolic function was normal. - Right atrium: The atrium was mildly dilated. - Tricuspid valve: There was moderate regurgitation. - Pulmonary arteries: Systolic pressure was mildly increased. PA  peak pressure: 33 mm Hg (S). - Inferior vena cava: The vessel was normal in size. - Pericardium, extracardiac: There was no pericardial  effusion  Consultations:  Cardiology  Discharge Exam: Filed Vitals:   04/22/16 0900 04/22/16 1000  BP:    Pulse: 124 118  Temp:    Resp: 14 18    General: Well developed, well nourished, NAD  HEENT: NCAT, , mucous membranes moist.   Cardiovascular: S1 S2 auscultated, no murmurs, RRR  Respiratory: Clear to auscultation bilaterally with equal chest rise  Abdomen: Soft, nontender, nondistended, + bowel sounds  Extremities: warm dry without cyanosis clubbing  Neuro: AAOx3, nonfocal  Psych: Appropriate mood and affect  Discharge Instructions   Discharge Instructions    Call MD for:  difficulty breathing, headache or visual disturbances    Complete by:  As directed      Call MD for:  extreme fatigue    Complete by:  As directed      Call MD for:  hives    Complete by:  As directed      Call MD for:  persistant dizziness or light-headedness    Complete by:  As directed      Call MD for:  persistant nausea and vomiting    Complete by:  As directed      Call MD for:  redness, tenderness, or signs of infection (pain, swelling, redness, odor or green/yellow discharge around incision site)    Complete by:  As directed      Call MD for:  severe uncontrolled pain    Complete by:  As directed      Call MD for:  temperature >100.4    Complete by:  As directed  Call MD for:    Complete by:  As directed      Diet - low sodium heart healthy    Complete by:  As directed      Increase activity slowly    Complete by:  As directed           Current Discharge Medication List    START taking these medications   Details  amiodarone (PACERONE) 200 MG tablet Take 1 tablet (200 mg total) by mouth 2 (two) times daily. Qty: 60 tablet, Refills: 1    folic acid (FOLVITE) 1 MG tablet Take 1 tablet (1 mg total) by mouth daily. Qty: 30 tablet, Refills: 0    Multiple Vitamin (MULTIVITAMIN WITH MINERALS) TABS tablet Take 1 tablet by mouth daily. Qty: 30 tablet, Refills: 0     sucralfate (CARAFATE) 1 g tablet Take 1 tablet (1 g total) by mouth 4 (four) times daily -  with meals and at bedtime. Qty: 30 tablet, Refills: 0    thiamine 100 MG tablet Take 1 tablet (100 mg total) by mouth daily. Qty: 30 tablet, Refills: 0      CONTINUE these medications which have CHANGED   Details  metoprolol tartrate (LOPRESSOR) 25 MG tablet Take 1 tablet (25 mg total) by mouth 2 (two) times daily. Qty: 60 tablet, Refills: 1      CONTINUE these medications which have NOT CHANGED   Details  omeprazole (PRILOSEC) 40 MG capsule Take 1 capsule (40 mg total) by mouth daily. For acid reflux Qty: 60 capsule, Refills: 2    traMADol (ULTRAM) 50 MG tablet Take 50 mg by mouth 3 (three) times daily. Refills: 0    traZODone (DESYREL) 100 MG tablet Take 100 mg by mouth at bedtime. Refills: 1      STOP taking these medications     amLODipine (NORVASC) 2.5 MG tablet      furosemide (LASIX) 40 MG tablet      spironolactone (ALDACTONE) 100 MG tablet        No Known Allergies Follow-up Information    Follow up with Thressa Sheller, MD In 1 week.   Specialty:  Internal Medicine   Contact information:   [address] [place], [place] [address] [phone]       Follow up with Baldwin Jamaica, PA-C On 05/05/2016.   Specialty:  Cardiology   Contact information:   [place] [address] [phone]        The results of significant diagnostics from this hospitalization (including imaging, microbiology, ancillary and laboratory) are listed below for reference.    Significant Diagnostic Studies: Dg Chest Port 1 View  04/18/2016  CLINICAL DATA:  75 year old male with chest pain and vomiting for 3 days. EXAM: PORTABLE CHEST 1 VIEW COMPARISON:  12/02/2015 and prior radiographs FINDINGS: Cardiomegaly again noted. There is no evidence of focal airspace disease, pulmonary edema, suspicious pulmonary nodule/mass, pleural effusion, or  pneumothorax. No acute bony abnormalities are identified. Remote left rib fractures are noted. IMPRESSION: Cardiomegaly without evidence of acute cardiopulmonary disease. Electronically Signed   By: Margarette Canada M.D.   On: 04/18/2016 19:55   Ct Angio Abd/pel W/ And/or W/o  04/18/2016  CLINICAL DATA:  75 year old male with upper GI bleed concern for mesenteric ischemia. EXAM: CTA ABDOMEN AND PELVIS wITHOUT AND WITH CONTRAST TECHNIQUE: Multidetector CT imaging of the abdomen and pelvis was performed using the standard protocol during bolus administration of intravenous contrast. Multiplanar reconstructed images and MIPs  were obtained and reviewed to evaluate the vascular anatomy. CONTRAST:  100 cc Isovue 370 COMPARISON:  Abdominal CT dated 12/12/2013 and ultrasound dated 12/12/2013 and 01/07/2016. FINDINGS: The visualized lung bases are clear. No intra-abdominal free air or free fluid. There is fatty infiltration of the liver with advanced cirrhosis and fibrotic changes. Focal lesion or focal enhancement identified. There multiple stones within the gallbladder. No pericholecystic fluid. Ultrasound may provide better evaluation of the gallbladder if clinically indicated. The pancreas, spleen, adrenal glands appear unremarkable. There is no hydronephrosis on either side. The visualized ureters and urinary bladder appear unremarkable. The prostate and seminal vesicles are grossly unremarkable. Evaluation of the bowel is limited in the absence of oral contrast. There is no evidence of bowel obstruction or active inflammation. There is minimal prominence of the tip of the appendix. The appendix is otherwise unremarkable without inflammatory changes. There is mild aortoiliac atherosclerotic disease. There is no aortic aneurysm or evidence of dissection. The origins of the celiac axis, SMA, IMA as well as the origins of the renal arteries appear patent. There is a classic celiac axis branching anatomy. No portal venous gas  identified. First the SMV is, splenic vein, and main portal vein are patent. 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 There is no adenopathy. The abdominal wall soft tissues appear unremarkable. There is degenerative changes of the spine. No acute fracture. Review of the MIP images confirms the above findings. IMPRESSION: No CT evidence of bowel ischemia as clinically concerned. Advanced cirrhosis with its type chronic changes of the liver and fatty infiltration. No discrete focal mass identified. 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 Cholelithiasis. No evidence of bowel obstruction or active inflammation. Normal appendix. Electronically Signed   By: Anner Crete M.D.   On: 04/18/2016 20:53   US Abdomen Limited Ruq  04/19/2016  CLINICAL DATA:  Initial evaluation for acute abdominal pain. Gallstones on prior CT. EXAM: US ABDOMEN LIMITED - RIGHT UPPER QUADRANT COMPARISON:  Prior CT from 04/18/2016. FINDINGS: Gallbladder: Multiple echogenic stones present within the gallbladder lumen. Gallbladder wall measured within normal limits at 2.4 mm. No free pericholecystic fluid. Sonographic Percell Miller sign was elicited on exam. Common bile duct: Diameter: 4.4 mm Liver: Extremely limited evaluation on this exam. Diffuse hepatic steatosis suspected. Known cirrhotic changes present. No focal mass. IMPRESSION: 1. Cholelithiasis with positive sonographic Murphy's sign. No other sonographic features indicative of acute cholecystitis. No biliary dilatation. 2. Cirrhosis with hepatic steatosis, better evaluated on recent CT. Electronically Signed   By: Jeannine Boga M.D.   On: 04/19/2016 00:56    Microbiology: Recent Results (from the past 240 hour(s))  MRSA PCR Screening     Status: None   Collection Time: 04/19/16  1:25 AM  Result Value Ref Range Status   MRSA by PCR NEGATIVE NEGATIVE Final    Comment:        The  GeneXpert MRSA Assay (FDA approved for NASAL specimens only), is one component of a comprehensive MRSA colonization surveillance program. It is not intended to diagnose MRSA infection nor to guide or monitor treatment for MRSA infections.   C difficile quick scan w PCR reflex     Status: None   Collection Time: 04/19/16  5:32 AM  Result Value Ref Range Status   C Diff antigen NEGATIVE NEGATIVE Final   C Diff toxin NEGATIVE NEGATIVE Final   C Diff interpretation No C. difficile detected.  Final  Gastrointestinal Panel  by PCR , Stool     Status: None   Collection Time: 04/19/16  5:32 AM  Result Value Ref Range Status   Campylobacter species NOT DETECTED NOT DETECTED Final   Plesimonas shigelloides NOT DETECTED NOT DETECTED Final   Salmonella species NOT DETECTED NOT DETECTED Final   Yersinia enterocolitica NOT DETECTED NOT DETECTED Final   Vibrio species NOT DETECTED NOT DETECTED Final   Vibrio cholerae NOT DETECTED NOT DETECTED Final   Enteroaggregative E coli (EAEC) NOT DETECTED NOT DETECTED Final   Enteropathogenic E coli (EPEC) NOT DETECTED NOT DETECTED Final   Enterotoxigenic E coli (ETEC) NOT DETECTED NOT DETECTED Final   Shiga like toxin producing E coli (STEC) NOT DETECTED NOT DETECTED Final   E. coli O157 NOT DETECTED NOT DETECTED Final   Shigella/Enteroinvasive E coli (EIEC) NOT DETECTED NOT DETECTED Final   Cryptosporidium NOT DETECTED NOT DETECTED Final   Cyclospora cayetanensis NOT DETECTED NOT DETECTED Final   Entamoeba histolytica NOT DETECTED NOT DETECTED Final   Giardia lamblia NOT DETECTED NOT DETECTED Final   Adenovirus F40/41 NOT DETECTED NOT DETECTED Final   Astrovirus NOT DETECTED NOT DETECTED Final   Norovirus GI/GII NOT DETECTED NOT DETECTED Final   Rotavirus A NOT DETECTED NOT DETECTED Final   Sapovirus (I, II, IV, and V) NOT DETECTED NOT DETECTED Final     Labs: Basic Metabolic Panel:  Recent Labs Lab 04/18/16 1855 04/20/16 0353 04/20/16 0758  04/21/16 0320 04/22/16 0230  NA 137 134*  --  134* 136  K 4.3 2.8*  --  4.1 4.7  CL 102 108  --  107 110  CO2 20* 21*  --  18* 20*  GLUCOSE 114* 106*  --  117* 116*  BUN <5* 5*  --  <5* <5*  CREATININE 0.86 0.85  --  0.83 0.87  CALCIUM 8.1* 7.1*  --  7.3* 7.6*  MG  --   --  1.2*  --  1.7   Liver Function Tests:  Recent Labs Lab 04/18/16 1855  AST 252*  ALT 77*  ALKPHOS 118  BILITOT 2.1*  PROT 6.2*  ALBUMIN 2.1*    Recent Labs Lab 04/18/16 1855  LIPASE 21   No results for input(s): AMMONIA in the last 168 hours. CBC:  Recent Labs Lab 04/18/16 1855 04/20/16 0353 04/21/16 0320 04/22/16 0230  WBC 6.0 5.2 5.8 4.1  NEUTROABS 3.8  --   --   --   HGB 14.5 12.5* 12.9* 13.0  HCT 41.7 36.2* 36.9* 37.1*  MCV 103.5* 105.5* 107.0* 106.9*  PLT 142* 100* 119* 103*   Cardiac Enzymes:  Recent Labs Lab 04/19/16 0212 04/19/16 0548 04/19/16 1216  TROPONINI 0.05* 0.06* 0.08*   BNP: BNP (last 3 results)  Recent Labs  12/13/15 1226 04/18/16 1855  BNP 256.6* 375.8*    ProBNP (last 3 results) No results for input(s): PROBNP in the last 8760 hours.  CBG: No results for input(s): GLUCAP in the last 168 hours.     Signed:  Kelvin Cellar MD.  [facility] 04/22/2016, 10:59 AM

## 2016-04-22 NOTE — Progress Notes (Signed)
Patient ambulated with assistance, now up to chair. BP remaining steady at 107/84, no complaint of dizzyness. Have paged Dr. Coralyn Pear to determine if he is to be discharged or transferred to another unit. Awaiting orders at this time.

## 2016-04-22 NOTE — Progress Notes (Signed)
   Subjective: [name]  Breathing is OK  Wants to go home   Objective: Filed Vitals:   04/22/16 0311 04/22/16 0400 04/22/16 0700 04/22/16 0753  BP: 92/73   109/80  Pulse: 115  72 115  Temp:  98.2 F (36.8 C)  97.8 F (36.6 C)  TempSrc:  Oral  Oral  Resp: 20  16 14   Height:      Weight:      SpO2: 100%  100% 100%   Weight change:   Intake/Output Summary (Last 24 hours) at 04/22/16 0845 Last data filed at 04/22/16 0600  Gross per 24 hour  Intake   2564 ml  Output    850 ml  Net   1714 ml    General: Alert, awake, oriented x3, in no acute distress Neck:  JVP is normal Heart: Regular rate and rhythm, without murmurs, rubs, gallops.  Lungs: Clear to auscultation.  No rales or wheezes. Exemities:  No edema.   Neuro: Grossly intact, nonfocal.  Tele:  SR and SVT (120s) Lab Results: Results for orders placed or performed during the hospital encounter of 04/18/16 (from the past 24 hour(s))  CBC     Status: Abnormal   Collection Time: 04/22/16  2:30 AM  Result Value Ref Range   WBC 4.1 4.0 - 10.5 K/uL   RBC 3.47 (L) 4.22 - 5.81 MIL/uL   Hemoglobin 13.0 13.0 - 17.0 g/dL   HCT 37.1 (L) 39.0 - 52.0 %   MCV 106.9 (H) 78.0 - 100.0 fL   MCH 37.5 (H) 26.0 - 34.0 pg   MCHC 35.0 30.0 - 36.0 g/dL   RDW 18.8 (H) 11.5 - 15.5 %   Platelets 103 (L) 150 - 400 K/uL  Basic metabolic panel     Status: Abnormal   Collection Time: 04/22/16  2:30 AM  Result Value Ref Range   Sodium 136 135 - 145 mmol/L   Potassium 4.7 3.5 - 5.1 mmol/L   Chloride 110 101 - 111 mmol/L   CO2 20 (L) 22 - 32 mmol/L   Glucose, Bld 116 (H) 65 - 99 mg/dL   BUN <5 (L) 6 - 20 mg/dL   Creatinine, Ser 0.87 0.61 - 1.24 mg/dL   Calcium 7.6 (L) 8.9 - 10.3 mg/dL   GFR calc non Af Amer >60 >60 mL/min   GFR calc Af Amer >60 >60 mL/min   Anion gap 6 5 - 15  Magnesium     Status: None   Collection Time: 04/22/16  2:30 AM  Result Value Ref Range   Magnesium 1.7 1.7 - 2.4 mg/dL    Studies/Results: No results  found.  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

## 2016-04-22 NOTE — Progress Notes (Signed)
spoke with Dr. Harrington Challenger, If patient is able to ambulate and has no dizzyness she feels that discharge would be appropriate. Will ambulate and evaluate for any dizzyness and then contact physician about discharge instructions if appropriate.

## 2016-04-28 ENCOUNTER — Observation Stay (HOSPITAL_COMMUNITY)
Admission: EM | Admit: 2016-04-28 | Discharge: 2016-05-01 | Disposition: A | Discharge status: Home / Self Care | Payer: Medicare Other | Attending: Internal Medicine | Admitting: Internal Medicine

## 2016-04-28 ENCOUNTER — Emergency Department (HOSPITAL_COMMUNITY): Payer: Medicare Other

## 2016-04-28 ENCOUNTER — Encounter (HOSPITAL_COMMUNITY): Payer: Self-pay | Admitting: Emergency Medicine

## 2016-04-28 ENCOUNTER — Ambulatory Visit: Payer: Self-pay | Admitting: Gastroenterology

## 2016-04-28 DIAGNOSIS — Z8719 Personal history of other diseases of the digestive system: Secondary | ICD-10-CM | POA: Insufficient documentation

## 2016-04-28 DIAGNOSIS — R740 Nonspecific elevation of levels of transaminase and lactic acid dehydrogenase [LDH]: Secondary | ICD-10-CM | POA: Insufficient documentation

## 2016-04-28 DIAGNOSIS — K7460 Unspecified cirrhosis of liver: Secondary | ICD-10-CM | POA: Diagnosis not present

## 2016-04-28 DIAGNOSIS — I471 Supraventricular tachycardia: Secondary | ICD-10-CM | POA: Insufficient documentation

## 2016-04-28 DIAGNOSIS — E876 Hypokalemia: Secondary | ICD-10-CM | POA: Insufficient documentation

## 2016-04-28 DIAGNOSIS — I252 Old myocardial infarction: Secondary | ICD-10-CM | POA: Diagnosis not present

## 2016-04-28 DIAGNOSIS — R52 Pain, unspecified: Secondary | ICD-10-CM

## 2016-04-28 DIAGNOSIS — R1030 Lower abdominal pain, unspecified: Secondary | ICD-10-CM | POA: Insufficient documentation

## 2016-04-28 DIAGNOSIS — I441 Atrioventricular block, second degree: Secondary | ICD-10-CM | POA: Insufficient documentation

## 2016-04-28 DIAGNOSIS — R109 Unspecified abdominal pain: Secondary | ICD-10-CM | POA: Diagnosis not present

## 2016-04-28 DIAGNOSIS — I517 Cardiomegaly: Secondary | ICD-10-CM | POA: Diagnosis not present

## 2016-04-28 DIAGNOSIS — I4891 Unspecified atrial fibrillation: Secondary | ICD-10-CM

## 2016-04-28 DIAGNOSIS — I119 Hypertensive heart disease without heart failure: Secondary | ICD-10-CM | POA: Insufficient documentation

## 2016-04-28 DIAGNOSIS — K760 Fatty (change of) liver, not elsewhere classified: Secondary | ICD-10-CM | POA: Insufficient documentation

## 2016-04-28 DIAGNOSIS — I959 Hypotension, unspecified: Secondary | ICD-10-CM | POA: Insufficient documentation

## 2016-04-28 DIAGNOSIS — R079 Chest pain, unspecified: Secondary | ICD-10-CM | POA: Diagnosis not present

## 2016-04-28 DIAGNOSIS — B1710 Acute hepatitis C without hepatic coma: Secondary | ICD-10-CM | POA: Diagnosis present

## 2016-04-28 DIAGNOSIS — I4510 Unspecified right bundle-branch block: Secondary | ICD-10-CM | POA: Diagnosis not present

## 2016-04-28 DIAGNOSIS — R7989 Other specified abnormal findings of blood chemistry: Secondary | ICD-10-CM | POA: Diagnosis present

## 2016-04-28 DIAGNOSIS — Z9114 Patient's other noncompliance with medication regimen: Secondary | ICD-10-CM | POA: Insufficient documentation

## 2016-04-28 DIAGNOSIS — K219 Gastro-esophageal reflux disease without esophagitis: Secondary | ICD-10-CM | POA: Insufficient documentation

## 2016-04-28 DIAGNOSIS — F1110 Opioid abuse, uncomplicated: Secondary | ICD-10-CM | POA: Diagnosis not present

## 2016-04-28 DIAGNOSIS — R6510 Systemic inflammatory response syndrome (SIRS) of non-infectious origin without acute organ dysfunction: Secondary | ICD-10-CM | POA: Diagnosis not present

## 2016-04-28 DIAGNOSIS — F1210 Cannabis abuse, uncomplicated: Secondary | ICD-10-CM | POA: Insufficient documentation

## 2016-04-28 DIAGNOSIS — R197 Diarrhea, unspecified: Secondary | ICD-10-CM | POA: Insufficient documentation

## 2016-04-28 DIAGNOSIS — I2510 Atherosclerotic heart disease of native coronary artery without angina pectoris: Secondary | ICD-10-CM | POA: Insufficient documentation

## 2016-04-28 DIAGNOSIS — K766 Portal hypertension: Secondary | ICD-10-CM | POA: Insufficient documentation

## 2016-04-28 DIAGNOSIS — A419 Sepsis, unspecified organism: Secondary | ICD-10-CM | POA: Diagnosis present

## 2016-04-28 DIAGNOSIS — I9589 Other hypotension: Secondary | ICD-10-CM | POA: Diagnosis not present

## 2016-04-28 DIAGNOSIS — B1920 Unspecified viral hepatitis C without hepatic coma: Secondary | ICD-10-CM | POA: Diagnosis not present

## 2016-04-28 DIAGNOSIS — F1010 Alcohol abuse, uncomplicated: Secondary | ICD-10-CM | POA: Insufficient documentation

## 2016-04-28 DIAGNOSIS — Z8711 Personal history of peptic ulcer disease: Secondary | ICD-10-CM | POA: Insufficient documentation

## 2016-04-28 DIAGNOSIS — I482 Chronic atrial fibrillation, unspecified: Secondary | ICD-10-CM | POA: Diagnosis present

## 2016-04-28 DIAGNOSIS — R1084 Generalized abdominal pain: Secondary | ICD-10-CM | POA: Diagnosis not present

## 2016-04-28 DIAGNOSIS — R112 Nausea with vomiting, unspecified: Secondary | ICD-10-CM | POA: Diagnosis not present

## 2016-04-28 DIAGNOSIS — I10 Essential (primary) hypertension: Secondary | ICD-10-CM | POA: Diagnosis present

## 2016-04-28 DIAGNOSIS — Z86718 Personal history of other venous thrombosis and embolism: Secondary | ICD-10-CM | POA: Insufficient documentation

## 2016-04-28 DIAGNOSIS — K922 Gastrointestinal hemorrhage, unspecified: Secondary | ICD-10-CM | POA: Diagnosis present

## 2016-04-28 LAB — COMPREHENSIVE METABOLIC PANEL
ALT: 64 U/L — AB (ref 17–63)
AST: 166 U/L — ABNORMAL HIGH (ref 15–41)
Albumin: 2.2 g/dL — ABNORMAL LOW (ref 3.5–5.0)
Alkaline Phosphatase: 100 U/L (ref 38–126)
Anion gap: 9 (ref 5–15)
BUN: <5 mg/dL — ABNORMAL LOW (ref 6–20)
CHLORIDE: 107 mmol/L (ref 101–111)
CO2: 24 mmol/L (ref 22–32)
CREATININE: 1.09 mg/dL (ref 0.61–1.24)
Calcium: 7.9 mg/dL — ABNORMAL LOW (ref 8.9–10.3)
Glucose, Bld: 110 mg/dL — ABNORMAL HIGH (ref 65–99)
POTASSIUM: 2.9 mmol/L — AB (ref 3.5–5.1)
SODIUM: 140 mmol/L (ref 135–145)
Total Bilirubin: 2.7 mg/dL — ABNORMAL HIGH (ref 0.3–1.2)
Total Protein: 6.2 g/dL — ABNORMAL LOW (ref 6.5–8.1)

## 2016-04-28 LAB — CBC WITH DIFFERENTIAL/PLATELET
BASOS ABS: 0 10*3/uL (ref 0.0–0.1)
Basophils Relative: 1 %
EOS ABS: 0 10*3/uL (ref 0.0–0.7)
EOS PCT: 1 %
HCT: 41.3 % (ref 39.0–52.0)
Hemoglobin: 14.4 g/dL (ref 13.0–17.0)
LYMPHS ABS: 1.1 10*3/uL (ref 0.7–4.0)
Lymphocytes Relative: 22 %
MCH: 36.7 pg — AB (ref 26.0–34.0)
MCHC: 34.9 g/dL (ref 30.0–36.0)
MCV: 105.4 fL — ABNORMAL HIGH (ref 78.0–100.0)
MONO ABS: 0.7 10*3/uL (ref 0.1–1.0)
Monocytes Relative: 13 %
Neutro Abs: 3.3 10*3/uL (ref 1.7–7.7)
Neutrophils Relative %: 63 %
PLATELETS: 154 10*3/uL (ref 150–400)
RBC: 3.92 MIL/uL — AB (ref 4.22–5.81)
RDW: 17.1 % — AB (ref 11.5–15.5)
WBC: 5.2 10*3/uL (ref 4.0–10.5)

## 2016-04-28 LAB — I-STAT CHEM 8, ED
BUN: <3 mg/dL — ABNORMAL LOW (ref 6–20)
CREATININE: 0.9 mg/dL (ref 0.61–1.24)
Calcium, Ion: 0.99 mmol/L — ABNORMAL LOW (ref 1.12–1.23)
Chloride: 103 mmol/L (ref 101–111)
GLUCOSE: 106 mg/dL — AB (ref 65–99)
HCT: 47 % (ref 39.0–52.0)
HEMOGLOBIN: 16 g/dL (ref 13.0–17.0)
POTASSIUM: 2.9 mmol/L — AB (ref 3.5–5.1)
Sodium: 145 mmol/L (ref 135–145)
TCO2: 23 mmol/L (ref 0–100)

## 2016-04-28 LAB — PROTIME-INR
INR: 1.65 — AB (ref 0.00–1.49)
PROTHROMBIN TIME: 19.5 s — AB (ref 11.6–15.2)

## 2016-04-28 LAB — I-STAT CG4 LACTIC ACID, ED
LACTIC ACID, VENOUS: 3.85 mmol/L — AB (ref 0.5–1.9)
LACTIC ACID, VENOUS: 3.46 mmol/L — AB (ref 0.5–1.9)

## 2016-04-28 LAB — AMMONIA: AMMONIA: 75 umol/L — AB (ref 9–35)

## 2016-04-28 LAB — LACTIC ACID, PLASMA
Lactic Acid, Venous: 2.8 mmol/L (ref 0.5–1.9)
Lactic Acid, Venous: 2 mmol/L (ref 0.5–1.9)

## 2016-04-28 LAB — LIPASE, BLOOD: LIPASE: 30 U/L (ref 11–51)

## 2016-04-28 LAB — TROPONIN I
TROPONIN I: 0.04 ng/mL — AB (ref <0.03)
Troponin I: 0.05 ng/mL (ref <0.03)

## 2016-04-28 LAB — MRSA PCR SCREENING: MRSA by PCR: NEGATIVE

## 2016-04-28 LAB — POC OCCULT BLOOD, ED: Fecal Occult Bld: NEGATIVE

## 2016-04-28 LAB — PROCALCITONIN

## 2016-04-28 LAB — PREPARE RBC (CROSSMATCH)

## 2016-04-28 LAB — I-STAT TROPONIN, ED: TROPONIN I, POC: 0.03 ng/mL (ref 0.00–0.08)

## 2016-04-28 LAB — APTT: aPTT: 31 seconds (ref 24–37)

## 2016-04-28 MED ORDER — ADULT MULTIVITAMIN W/MINERALS CH
1.0000 | ORAL_TABLET | Freq: Every day | ORAL | Status: DC
  Filled 2016-04-28 (×2): qty 1

## 2016-04-28 MED ORDER — LORAZEPAM 1 MG PO TABS
1.0000 mg | ORAL_TABLET | Freq: Four times a day (QID) | ORAL | Status: DC | PRN
  Administered 2016-04-29 (×2): 1 mg via ORAL
  Filled 2016-04-28 (×2): qty 1

## 2016-04-28 MED ORDER — DILTIAZEM LOAD VIA INFUSION: 20.0000 mg | Freq: Once | INTRAVENOUS | Status: DC

## 2016-04-28 MED ORDER — PANTOPRAZOLE SODIUM 40 MG PO TBEC
40.0000 mg | DELAYED_RELEASE_TABLET | Freq: Every day | ORAL | Status: DC
  Administered 2016-04-29 – 2016-05-01 (×3): 40 mg via ORAL
  Filled 2016-04-28 (×3): qty 1

## 2016-04-28 MED ORDER — LORAZEPAM 2 MG/ML IJ SOLN
0.0000 mg | Freq: Two times a day (BID) | INTRAMUSCULAR | Status: DC
  Administered 2016-04-30: 2 mg via INTRAVENOUS
  Filled 2016-04-28: qty 1

## 2016-04-28 MED ORDER — DEXTROSE-NACL 5-0.45 % IV SOLN
INTRAVENOUS | Status: DC
  Administered 2016-04-28 – 2016-04-29 (×2): via INTRAVENOUS

## 2016-04-28 MED ORDER — MAGNESIUM SULFATE 2 GM/50ML IV SOLN
2.0000 g | Freq: Once | INTRAVENOUS | Status: AC
  Administered 2016-04-28: 2 g via INTRAVENOUS
  Filled 2016-04-28: qty 50

## 2016-04-28 MED ORDER — DILTIAZEM HCL 100 MG IV SOLR: 5.0000 mg/h | INTRAVENOUS | Status: DC

## 2016-04-28 MED ORDER — FOLIC ACID 1 MG PO TABS
1.0000 mg | ORAL_TABLET | Freq: Every day | ORAL | Status: DC
  Administered 2016-04-29 – 2016-05-01 (×3): 1 mg via ORAL
  Filled 2016-04-28 (×3): qty 1

## 2016-04-28 MED ORDER — LORAZEPAM 2 MG/ML IJ SOLN: 1.0000 mg | Freq: Four times a day (QID) | INTRAMUSCULAR | Status: DC | PRN

## 2016-04-28 MED ORDER — OXYCODONE HCL 5 MG PO TABS
10.0000 mg | ORAL_TABLET | Freq: Four times a day (QID) | ORAL | Status: DC | PRN
  Administered 2016-04-30 (×2): 10 mg via ORAL
  Filled 2016-04-28 (×2): qty 2

## 2016-04-28 MED ORDER — POTASSIUM CHLORIDE 10 MEQ/100ML IV SOLN
10.0000 meq | Freq: Once | INTRAVENOUS | Status: AC
  Administered 2016-04-28: 10 meq via INTRAVENOUS
  Filled 2016-04-28: qty 100

## 2016-04-28 MED ORDER — THIAMINE HCL 100 MG/ML IJ SOLN
100.0000 mg | Freq: Once | INTRAMUSCULAR | Status: AC
  Administered 2016-04-28: 100 mg via INTRAVENOUS
  Filled 2016-04-28: qty 2

## 2016-04-28 MED ORDER — POTASSIUM CHLORIDE 20 MEQ/15ML (10%) PO SOLN
40.0000 meq | Freq: Once | ORAL | Status: AC
  Administered 2016-04-28: 40 meq via ORAL
  Filled 2016-04-28: qty 30

## 2016-04-28 MED ORDER — SUCRALFATE 1 G PO TABS
1.0000 g | ORAL_TABLET | Freq: Three times a day (TID) | ORAL | Status: DC
  Administered 2016-04-28 – 2016-05-01 (×10): 1 g via ORAL
  Filled 2016-04-28 (×10): qty 1

## 2016-04-28 MED ORDER — FAMOTIDINE 20 MG PO TABS: 20.0000 mg | ORAL_TABLET | Freq: Every day | ORAL | Status: DC

## 2016-04-28 MED ORDER — SODIUM CHLORIDE 0.9 % IV SOLN
10.0000 mL/h | Freq: Once | INTRAVENOUS | Status: AC
  Administered 2016-04-28: 10 mL/h via INTRAVENOUS

## 2016-04-28 MED ORDER — AMIODARONE HCL 200 MG PO TABS
200.0000 mg | ORAL_TABLET | Freq: Two times a day (BID) | ORAL | Status: DC
  Administered 2016-04-28 – 2016-05-01 (×6): 200 mg via ORAL
  Filled 2016-04-28 (×6): qty 1

## 2016-04-28 MED ORDER — LORAZEPAM 2 MG/ML IJ SOLN: 0.0000 mg | Freq: Four times a day (QID) | INTRAMUSCULAR | Status: AC

## 2016-04-28 MED ORDER — METOPROLOL TARTRATE 5 MG/5ML IV SOLN
2.5000 mg | Freq: Once | INTRAVENOUS | Status: AC
  Administered 2016-04-28: 2.5 mg via INTRAVENOUS
  Filled 2016-04-28: qty 5

## 2016-04-28 MED ORDER — SODIUM CHLORIDE 0.9 % IV SOLN: INTRAVENOUS | Status: DC

## 2016-04-28 MED ORDER — ADULT MULTIVITAMIN W/MINERALS CH
1.0000 | ORAL_TABLET | Freq: Every day | ORAL | Status: DC
  Administered 2016-04-29 – 2016-05-01 (×3): 1 via ORAL
  Filled 2016-04-28 (×3): qty 1

## 2016-04-28 MED ORDER — SODIUM CHLORIDE 0.9% FLUSH
3.0000 mL | Freq: Two times a day (BID) | INTRAVENOUS | Status: DC
  Administered 2016-04-28 – 2016-05-01 (×5): 3 mL via INTRAVENOUS

## 2016-04-28 MED ORDER — VITAMIN B-1 100 MG PO TABS
100.0000 mg | ORAL_TABLET | Freq: Every day | ORAL | Status: DC
  Administered 2016-04-29 – 2016-05-01 (×3): 100 mg via ORAL
  Filled 2016-04-28 (×3): qty 1

## 2016-04-28 MED ORDER — SODIUM CHLORIDE 0.9 % IV BOLUS (SEPSIS)
1000.0000 mL | Freq: Once | INTRAVENOUS | Status: AC
  Administered 2016-04-28: 1000 mL via INTRAVENOUS

## 2016-04-28 MED ORDER — SODIUM CHLORIDE 0.9 % IV BOLUS (SEPSIS)
500.0000 mL | Freq: Once | INTRAVENOUS | Status: AC
  Administered 2016-04-28: 500 mL via INTRAVENOUS

## 2016-04-28 MED ORDER — TRAZODONE HCL 100 MG PO TABS
100.0000 mg | ORAL_TABLET | Freq: Every day | ORAL | Status: DC
  Administered 2016-04-28 – 2016-04-30 (×3): 100 mg via ORAL
  Filled 2016-04-28 (×3): qty 1

## 2016-04-28 MED ORDER — NITROGLYCERIN 0.4 MG SL SUBL: 0.4000 mg | SUBLINGUAL_TABLET | SUBLINGUAL | Status: DC | PRN

## 2016-04-28 MED ORDER — SODIUM CHLORIDE 0.9 % IV SOLN
Freq: Once | INTRAVENOUS | Status: AC
  Administered 2016-04-28: 16:00:00 via INTRAVENOUS

## 2016-04-28 MED ORDER — METOPROLOL TARTRATE 25 MG PO TABS
25.0000 mg | ORAL_TABLET | Freq: Two times a day (BID) | ORAL | Status: DC
  Administered 2016-04-28 – 2016-04-29 (×3): 25 mg via ORAL
  Filled 2016-04-28 (×4): qty 1

## 2016-04-28 NOTE — ED Notes (Signed)
Dr. Doree Albee advised patient may eat. Patient provided sandwich and ginger ale per patient's request

## 2016-04-28 NOTE — ED Notes (Signed)
Upon this RN assessing patient's iv site in left arm, patient's iv had infiltrated with 0.9% normal saline that was running. Fluids stopped, IV removed with catheter intact and heat applied to patient's arm.

## 2016-04-28 NOTE — H&P (Signed)
History and Physical    Nathan Frank [id number] DOB: 1941-02-22 DOA: 04/28/2016  Referring MD/NP/PA:   PCP: Thressa Sheller, MD   Patient coming from:  The patient is coming from home.  At baseline, pt is independent for most of ADL.        Chief Complaint: diarrhea, abdominal pain, chest pain,   HPI: Nathan Frank is a 75 y.o. male with medical history significant of atrial fibrillation not on anticoagulation due to hx of GI bleeding, liver cirrhosis, HCV, alcohol abuse, heroin abuse, DVT, gastric ulcer, GI bleeding, CAD, medication noncompliance, who presents with diarrhea, abdominal pain, chest pain.  Patient reports that he has been having intermittent abdominal pain for more than one week. It is located in the lower abdomen, worse on the right lower quadrant, 8 out of 10 in severity, nonradiating. Patient states that he has diarrhea in the past 2 days. He has 5-6 watery bowel movement each day, with black colored stool. He denies nausea, vomiting, fever or chills. He also reports chest pain which has been going on intermittently for 3 days. His chest pain is located in the left chest, 6 out of 10 in severity, nonradiating, non-pleuritic. He does not have shortness of breath, cough. No tenderness over calf areas. He states that he has dysuria and burning on urination sometimes. No rashes or unilateral weakness. Pt was initially hypotensive with Bp 89/67 which improved to 103/83 after 2 L normal saline bolus in ED. He was in A fib with RVR with HR 123 in ED.  ED Course: pt was found to have hemoglobin 14.4, negative FOBT, WBC 5.2, temperature normal, oxygen saturation 97% on room air, potassium 2.9, creatinine 1.09, negative chest x-ray, lactate is 3.85, INR 1.65, troponin 0.05. Pending UA. Patient is admitted to stepdown bed as inpt for further eval and treatment.  Review of Systems:   General: no fevers, chills, no changes in body weight, has poor appetite, has fatigue HEENT: no  blurry vision, hearing changes or sore throat Pulm: no dyspnea, coughing, wheezing CV: has chest pain, no palpitations Abd: no nausea, vomiting, has abdominal pain, diarrhea, no constipation GU: has dysuria, burning on urination, no increased urinary frequency, hematuria  Ext: no leg edema Neuro: no unilateral weakness, numbness, or tingling, no vision change or hearing loss Skin: no rash MSK: No muscle spasm, no deformity, no limitation of range of movement in spin Heme: No easy bruising.  Travel history: No recent long distant travel.  Allergy: No Known Allergies  Past Medical History:  Diagnosis Date  . Abnormal TSH   . Acute gastric ulcer   . Acute gastritis with hemorrhage   . Alcohol abuse   . Alcohol dependence ([place]) 02/02/2014  . Anemia   . Cirrhosis ([place]) 06/04/2012  . Depressive disorder 02/01/2014  . Dizziness and giddiness 12/13/2014  . DVT of lower limb, acute ([place]) 06/06/2012  . Essential hypertension   . Fatty liver   . Gallstones   . Gastritis 06/07/2012  . GERD (gastroesophageal reflux disease)   . GI bleed due to NSAIDs 12/13/2014  . Granulomatous gastritis   . Hematuria 06/04/2012  . Hepatitis C   . Heroin abuse    "I haven't done that since I don't know when."  . Heroin overdose 02/20/2014  . LV dysfunction    a. 04/2015: EF 45-50% by cath.  . Neuropathy ([place])   . NSTEMI (non-ST elevated myocardial infarction) ([place])    a. 04/2015 - patent coronaries. Etiology possibly due  to coronary spasm versus embolus, stress cardiomyopathy (atypical), and aborted infarction related to plaque rupture with thrombosis and dissolution. Amlodipine started. Not on antiplatelets due to GIB/cirrhosis history.  . Oral thrush 06/05/2012  . Polysubstance abuse    Rare marijuana.  No EtOH x 2 months.   . Prolonged Q-T interval on ECG    a. 12/2014 - treated with magnesium.  . Right knee pain 12/13/2014  . S/P alcohol detoxification 02/02/2014  . SVT (supraventricular tachycardia) ([place])     a. 12/2014 in setting of GIB, ETOH, NSAIDS, gastritis.  . Symptomatic cholelithiasis 12/15/2013  . Thrombocytopenia ([place])   . Upper GI bleeding 12/13/2014  . Weight loss 06/04/2012    Past Surgical History:  Procedure Laterality Date  . CARDIAC CATHETERIZATION N/A 04/15/2015   Procedure: Left Heart Cath and Coronary Angiography;  Surgeon: Belva Crome, MD;  Location: [facility];  Service: Cardiovascular;  Laterality: N/A;  . CIRCUMCISION    . ESOPHAGOGASTRODUODENOSCOPY  06/07/2012   Procedure: ESOPHAGOGASTRODUODENOSCOPY (EGD);  Surgeon: Milus Banister, MD;  Location: [facility];  Service: Endoscopy;  Laterality: N/A;  may need treatment of varices  . ESOPHAGOGASTRODUODENOSCOPY N/A 12/14/2014   Procedure: ESOPHAGOGASTRODUODENOSCOPY (EGD);  Surgeon: Jerene Bears, MD;  Location: [facility];  Service: Endoscopy;  Laterality: N/A;  . TONSILLECTOMY      Social History:  reports that he has quit smoking. His smoking use included Cigarettes. He has a 2.50 pack-year smoking history. He has never used smokeless tobacco. He reports that he drinks about 16.8 oz of alcohol per week . He reports that he uses drugs, including Heroin.  Family History:  Family History  Problem Relation Age of Onset  . Stroke Father   . Prostate cancer Brother   . Heart disease Mother     Pacemaker     Prior to Admission medications   Medication Sig Start Date End Date Taking? Authorizing Provider  amiodarone (PACERONE) 200 MG tablet Take 1 tablet (200 mg total) by mouth 2 (two) times daily. 04/22/16  Yes Kelvin Cellar, MD  folic acid (FOLVITE) 1 MG tablet Take 1 tablet (1 mg total) by mouth daily. 04/21/16  Yes Maryann Mikhail, DO  furosemide (LASIX) 40 MG tablet Take 40 mg by mouth daily. 04/18/16  Yes Historical Provider, MD  metoprolol tartrate (LOPRESSOR) 25 MG tablet Take 1 tablet (25 mg total) by mouth 2 (two) times daily. 04/22/16  Yes Kelvin Cellar, MD  Multiple Vitamin (MULTIVITAMIN WITH MINERALS)  TABS tablet Take 1 tablet by mouth daily. 04/21/16  Yes Maryann Mikhail, DO  omeprazole (PRILOSEC) 40 MG capsule Take 1 capsule (40 mg total) by mouth daily. For acid reflux 12/15/14  Yes Thurnell Lose, MD  spironolactone (ALDACTONE) 25 MG tablet Take 1 tablet by mouth daily. 02/18/16  Yes Historical Provider, MD  sucralfate (CARAFATE) 1 g tablet Take 1 tablet (1 g total) by mouth 4 (four) times daily -  with meals and at bedtime. 04/22/16  Yes Kelvin Cellar, MD  thiamine 100 MG tablet Take 1 tablet (100 mg total) by mouth daily. 04/21/16  Yes Maryann Mikhail, DO  traMADol (ULTRAM) 50 MG tablet Take 50 mg by mouth 3 (three) times daily. 03/04/16  Yes Historical Provider, MD  traZODone (DESYREL) 100 MG tablet Take 100 mg by mouth at bedtime. 02/20/16  Yes Historical Provider, MD    Physical Exam: Vitals:   04/28/16 1900 04/28/16 1923 04/28/16 1930 04/28/16 2024  BP: 103/83  113/85 110/74  Pulse:  91  Resp: 20  25   Temp:      SpO2:  98%     General: Not in acute distress HEENT:       Eyes: PERRL, EOMI, no scleral icterus.       ENT: No discharge from the ears and nose, no pharynx injection, no tonsillar enlargement.        Neck: No JVD, no bruit, no mass felt. Heme: No neck lymph node enlargement. Cardiac: S1/S2, RRR, No murmurs, No gallops or rubs. Pulm: No rales, wheezing, rhonchi or rubs. Abd: Soft, nondistended, tenderness over lower abdomen, no rebound pain, no organomegaly, BS present. GU: No hematuria Ext: No pitting leg edema bilaterally. 2+DP/PT pulse bilaterally. Musculoskeletal: No joint deformities, No joint redness or warmth, no limitation of ROM in spin. Skin: No rashes.  Neuro: Alert, oriented X3, cranial nerves II-XII grossly intact, moves all extremities normally.  Psych: Patient is not psychotic, no suicidal or hemocidal ideation.  Labs on Admission: I have personally reviewed following labs and imaging studies  CBC:  Recent Labs Lab 04/22/16 0230  04/28/16 1536 04/28/16 1552  WBC 4.1 5.2  --   NEUTROABS  --  3.3  --   HGB 13.0 14.4 16.0  HCT 37.1* 41.3 47.0  MCV 106.9* 105.4*  --   PLT 103* 154  --    Basic Metabolic Panel:  Recent Labs Lab 04/22/16 0230 04/28/16 1536 04/28/16 1552  NA 136 140 145  K 4.7 2.9* 2.9*  CL 110 107 103  CO2 20* 24  --   GLUCOSE 116* 110* 106*  BUN <5* <5* <3*  CREATININE 0.87 1.09 0.90  CALCIUM 7.6* 7.9*  --   MG 1.7  --   --    GFR: Estimated Creatinine Clearance: 85.3 mL/min (by C-G formula based on SCr of 0.9 mg/dL). Liver Function Tests:  Recent Labs Lab 04/28/16 1536  AST 166*  ALT 64*  ALKPHOS 100  BILITOT 2.7*  PROT 6.2*  ALBUMIN 2.2*   No results for input(s): LIPASE, AMYLASE in the last 168 hours.  Recent Labs Lab 04/28/16 1536  AMMONIA 75*   Coagulation Profile:  Recent Labs Lab 04/28/16 1536  INR 1.65*   Cardiac Enzymes:  Recent Labs Lab 04/28/16 1536  TROPONINI 0.05*   BNP (last 3 results) No results for input(s): PROBNP in the last 8760 hours. HbA1C: No results for input(s): HGBA1C in the last 72 hours. CBG: No results for input(s): GLUCAP in the last 168 hours. Lipid Profile: No results for input(s): CHOL, HDL, LDLCALC, TRIG, CHOLHDL, LDLDIRECT in the last 72 hours. Thyroid Function Tests: No results for input(s): TSH, T4TOTAL, FREET4, T3FREE, THYROIDAB in the last 72 hours. Anemia Panel: No results for input(s): VITAMINB12, FOLATE, FERRITIN, TIBC, IRON, RETICCTPCT in the last 72 hours. Urine analysis:    Component Value Date/Time   COLORURINE AMBER (A) 04/19/2016 0237   APPEARANCEUR CLEAR 04/19/2016 0237   LABSPEC <1.005 (L) 04/19/2016 0237   PHURINE 6.5 04/19/2016 0237   GLUCOSEU NEGATIVE 04/19/2016 0237   HGBUR NEGATIVE 04/19/2016 0237   BILIRUBINUR SMALL (A) 04/19/2016 0237   KETONESUR NEGATIVE 04/19/2016 0237   PROTEINUR NEGATIVE 04/19/2016 0237   UROBILINOGEN 2.0 (H) 12/13/2014 1256   NITRITE NEGATIVE 04/19/2016 0237    LEUKOCYTESUR NEGATIVE 04/19/2016 0237   Sepsis Labs: @LABRCNTIP (procalcitonin:4,lacticidven:4) ) Recent Results (from the past 240 hour(s))  MRSA PCR Screening     Status: None   Collection Time: 04/19/16  1:25 AM  Result Value Ref  Range Status   MRSA by PCR NEGATIVE NEGATIVE Final    Comment:        The GeneXpert MRSA Assay (FDA approved for NASAL specimens only), is one component of a comprehensive MRSA colonization surveillance program. It is not intended to diagnose MRSA infection nor to guide or monitor treatment for MRSA infections.   C difficile quick scan w PCR reflex     Status: None   Collection Time: 04/19/16  5:32 AM  Result Value Ref Range Status   C Diff antigen NEGATIVE NEGATIVE Final   C Diff toxin NEGATIVE NEGATIVE Final   C Diff interpretation No C. difficile detected.  Final  Gastrointestinal Panel by PCR , Stool     Status: None   Collection Time: 04/19/16  5:32 AM  Result Value Ref Range Status   Campylobacter species NOT DETECTED NOT DETECTED Final   Plesimonas shigelloides NOT DETECTED NOT DETECTED Final   Salmonella species NOT DETECTED NOT DETECTED Final   Yersinia enterocolitica NOT DETECTED NOT DETECTED Final   Vibrio species NOT DETECTED NOT DETECTED Final   Vibrio cholerae NOT DETECTED NOT DETECTED Final   Enteroaggregative E coli (EAEC) NOT DETECTED NOT DETECTED Final   Enteropathogenic E coli (EPEC) NOT DETECTED NOT DETECTED Final   Enterotoxigenic E coli (ETEC) NOT DETECTED NOT DETECTED Final   Shiga like toxin producing E coli (STEC) NOT DETECTED NOT DETECTED Final   E. coli O157 NOT DETECTED NOT DETECTED Final   Shigella/Enteroinvasive E coli (EIEC) NOT DETECTED NOT DETECTED Final   Cryptosporidium NOT DETECTED NOT DETECTED Final   Cyclospora cayetanensis NOT DETECTED NOT DETECTED Final   Entamoeba histolytica NOT DETECTED NOT DETECTED Final   Giardia lamblia NOT DETECTED NOT DETECTED Final   Adenovirus F40/41 NOT DETECTED NOT DETECTED  Final   Astrovirus NOT DETECTED NOT DETECTED Final   Norovirus GI/GII NOT DETECTED NOT DETECTED Final   Rotavirus A NOT DETECTED NOT DETECTED Final   Sapovirus (I, II, IV, and V) NOT DETECTED NOT DETECTED Final     Radiological Exams on Admission: Dg Chest Portable 1 View  Result Date: 04/28/2016 CLINICAL DATA:  Midsternal on left-sided chest pain. Tightness and pressure. EXAM: PORTABLE CHEST 1 VIEW COMPARISON:  One-view chest x-ray 04/18/2016 FINDINGS: Heart size normal. Lungs are clear. The visualized soft tissues and bony thorax are unremarkable. IMPRESSION: Negative one-view chest x-ray Electronically Signed   By: San Morelle M.D.   On: 04/28/2016 15:58    EKG: Independently reviewed.  Sinus rhythm, QTC 517, right bundle blockage, T-wave inversion in V2-V4.  Assessment/Plan Principal Problem:   Chest pain Active Problems:   Alcohol abuse   Hepatitis C   Cirrhosis (HCC)   Abdominal pain   Upper GI bleeding   Essential hypertension   Atrial fibrillation with RVR (HCC)   Hypotension   Elevated lactic acid level   Chronic atrial fibrillation (HCC)   SIRS (systemic inflammatory response syndrome) (HCC)   Atrial fibrillation with rapid ventricular response (HCC)   Diarrhea  CP: pt initially had A fib with RVR, with heart rates up to 123. Trop is positive at 0.05. Pt had hypotension with Bp 89/67 which responded to IVF, and improved to 103/83 after 2L NS bolus. Pt has ongoing chest pain, possibly due to demanding ischemia.   - will admit to SDU - continue home metoprolol and amiodarone - if HR is not controled, will start IV cardizem gtt - cycle CE q6 x3 - prn Nitroglycerin, oxycodone - will hold  off ASA since pt has high risk of GI bleeding. Previous CT scan on 04/18/16 showed evidence of portal hypertension with gastric and paraesophageal varices prone to upper GI bleed. - Risk factor stratification: his recent FLP showed LDL 36  and A1C 4.6 on 04/19/16 - He had 2d  echo on 04/20/16, which showed EF of 55-60% with grade 2 diastolic dysfunction. - please call Card in AM  A fib with RVR: HR is up to 123 on admission, which lowered to 110s with IV fluid. recent tsh 4.63 and Free T4 normal at 1.09 on 7/171/7. CHA2DS2-VASc Score is 4. Pt is not a candidate for [facility] due to high risk of GI bleeding. -continue home metoprolol and amiodarone -if HR is not controled, will start IV cardizem gtt   Diarrhea and abdominal pain: unclear etiology. Pt had CTA of abdomen/pelvis on 04/18/16, which did not show ischemic bowel. No significant ascites. Patient does not have fever, leukocytosis, less likely to have SBP. Likely due to viral gastritis, but need to rule out C. difficile colitis.  -check C diff prn and Gi path panel -When necessary hydroxyzine for nausea -IV fluid: 2.5L NS and then 100 cc/h -check lipase  Elevated lactic acid level: Etiology is not clear. Differential diagnosis include SIRS vs.sepsis, hypoperfusion secondary to hypotension and starvation due to alcohol abuse. -will get Procalcitonin and trend lactic acid levels per sepsis protocol. -IVF: 2.5 L of NS bolus in ED, followed by 100 cc/h of D5-1/2NS  -will get  Blood culture if pt develops fever  Hypotension: This is likely due to combination of hypovolemic volume status secondary to diarrhea and severe tachycardia. Currently hemodynamically stable. -IV fluid as above  SIRS or Sepsis - Assessment   Performed at:    9:02 PM   Last Vitals:    Blood pressure 110/74, pulse 91, temperature 98.3 F (36.8 C), resp. rate 25, SpO2 98 %.  Heart:                  Regular rhythm Tachchycarida  Lungs:     Clear to auscultation  Capillary Refill:   < seconds    Peripheral Pulse (include location): Brachial pulse palpable   Skin (include color):              Normal   Hx of gastric ulcer and upper GI bleeding:  Pt reports dark stool, but FOBT is negative. Hgb is stable at 14.4. Doubt that pt has active  GIB, but he is at high risk of bleeding -continue protonix. If his c diff pcr comes back positive, will need to stop protonix  Hepatitis C and Cirrhosis: Mental status is normal. No signs of hepatic encephalopathy. No significant ascites on previous CT scan. -Avoid medications with liver toxicity, such as atenolol  Alcohol abuse: -Did counseling about the importance of quitting drinking -CIWA protocol  DVT ppx: SCD Code Status: Full code Family Communication: None at bed side.  Disposition Plan:  Anticipate discharge back to previous home environment Consults called:  none Admission status: SDU/inpation       Date of Service 04/28/2016    Ivor Costa [facility] Pager [phone]  If 7PM-7AM, please contact night-coverage [email or web address] Password [email or web address] 04/28/2016, 9:01 PM

## 2016-04-28 NOTE — ED Provider Notes (Signed)
[facility] DEPT Provider Note   CSN: [id number] Arrival date & time: 04/28/16  1508  First Provider Contact:  First MD Initiated Contact with Patient 04/28/16 1540        History   Chief Complaint Chief Complaint  Patient presents with  . Chest Pain  . Rectal Bleeding    HPI Nathan Frank is a 75 y.o. male.   Chest Pain   This is a recurrent problem. The current episode started more than 2 days ago. The problem occurs every several days. The problem has not changed since onset.The pain is present in the substernal region. The pain is at a severity of 6/10. The pain is moderate. The pain does not radiate. Pertinent negatives include no abdominal pain, no back pain, no cough, no diaphoresis, no fever, no nausea, no palpitations, no shortness of breath and no vomiting.  Rectal Bleeding  Quality:  Black and tarry Amount:  Moderate Duration:  6 days Timing:  Intermittent Chronicity:  Recurrent Similar prior episodes: yes   Relieved by:  None tried Associated symptoms: no abdominal pain, no epistaxis, no fever, no hematemesis, no light-headedness, no loss of consciousness, no recent illness and no vomiting   Risk factors: liver disease     Past Medical History:  Diagnosis Date  . Abnormal TSH   . Acute gastric ulcer   . Acute gastritis with hemorrhage   . Alcohol abuse   . Alcohol dependence ([place]) 02/02/2014  . Anemia   . Cirrhosis ([place]) 06/04/2012  . Depressive disorder 02/01/2014  . Dizziness and giddiness 12/13/2014  . DVT of lower limb, acute ([place]) 06/06/2012  . Essential hypertension   . Fatty liver   . Gallstones   . Gastritis 06/07/2012  . GERD (gastroesophageal reflux disease)   . GI bleed due to NSAIDs 12/13/2014  . Granulomatous gastritis   . Hematuria 06/04/2012  . Hepatitis C   . Heroin abuse    "I haven't done that since I don't know when."  . Heroin overdose 02/20/2014  . LV dysfunction    a. 04/2015: EF 45-50% by cath.  . Neuropathy ([place])   . NSTEMI  (non-ST elevated myocardial infarction) ([place])    a. 04/2015 - patent coronaries. Etiology possibly due to coronary spasm versus embolus, stress cardiomyopathy (atypical), and aborted infarction related to plaque rupture with thrombosis and dissolution. Amlodipine started. Not on antiplatelets due to GIB/cirrhosis history.  . Oral thrush 06/05/2012  . Polysubstance abuse    Rare marijuana.  No EtOH x 2 months.   . Prolonged Q-T interval on ECG    a. 12/2014 - treated with magnesium.  . Right knee pain 12/13/2014  . S/P alcohol detoxification 02/02/2014  . SVT (supraventricular tachycardia) ([place])    a. 12/2014 in setting of GIB, ETOH, NSAIDS, gastritis.  . Symptomatic cholelithiasis 12/15/2013  . Thrombocytopenia ([place])   . Upper GI bleeding 12/13/2014  . Weight loss 06/04/2012    Patient Active Problem List   Diagnosis Date Noted  . Atrial fibrillation with rapid ventricular response ([place]) 04/28/2016  . Diarrhea 04/28/2016  . SIRS (systemic inflammatory response syndrome) ([place]) 04/19/2016  . Alcohol use disorder, moderate, in controlled environment, dependence ([place])   . Lactic acidosis   . Tachycardia   . Atrial fibrillation with RVR ([place]) 04/18/2016  . Chest pain 04/18/2016  . Hypotension 04/18/2016  . Elevated lactic acid level 04/18/2016  . Atrial flutter with rapid ventricular response ([place]) 04/18/2016  . Chronic atrial fibrillation ([place]) 04/18/2016  .  SVT (supraventricular tachycardia) ([place])   . Prolonged Q-T interval on ECG   . Neuropathy ([place])   . Essential hypertension   . Thrombocytopenia ([place]) 04/16/2015  . Abnormal TSH 04/16/2015  . NSTEMI (non-ST elevated myocardial infarction) ([place])   . Acute gastric ulcer   . Acute gastritis with hemorrhage   . GI bleed due to NSAIDs 12/13/2014  . Right knee pain 12/13/2014  . Dizziness and giddiness 12/13/2014  . Upper GI bleeding 12/13/2014  . Heroin overdose 02/20/2014  . S/P alcohol detoxification 02/02/2014  . Alcohol dependence  ([place]) 02/02/2014  . Depressive disorder 02/01/2014  . Symptomatic cholelithiasis 12/15/2013  . Abdominal pain 12/14/2013  . Gastritis 06/07/2012  . DVT of lower limb, acute ([place]) 06/06/2012  . Anemia 06/06/2012  . Oral thrush 06/05/2012  . Alcohol abuse 06/04/2012  . Weight loss 06/04/2012  . Hepatitis C 06/04/2012  . Cirrhosis ([place]) 06/04/2012  . Hematuria 06/04/2012    Past Surgical History:  Procedure Laterality Date  . CARDIAC CATHETERIZATION N/A 04/15/2015   Procedure: Left Heart Cath and Coronary Angiography;  Surgeon: Belva Crome, MD;  Location: [facility];  Service: Cardiovascular;  Laterality: N/A;  . CIRCUMCISION    . ESOPHAGOGASTRODUODENOSCOPY  06/07/2012   Procedure: ESOPHAGOGASTRODUODENOSCOPY (EGD);  Surgeon: Milus Banister, MD;  Location: [facility];  Service: Endoscopy;  Laterality: N/A;  may need treatment of varices  . ESOPHAGOGASTRODUODENOSCOPY N/A 12/14/2014   Procedure: ESOPHAGOGASTRODUODENOSCOPY (EGD);  Surgeon: Jerene Bears, MD;  Location: [facility] ENDOSCOPY;  Service: Endoscopy;  Laterality: N/A;  . TONSILLECTOMY         Home Medications    Prior to Admission medications   Medication Sig Start Date End Date Taking? Authorizing Provider  amiodarone (PACERONE) 200 MG tablet Take 1 tablet (200 mg total) by mouth 2 (two) times daily. 04/22/16  Yes Kelvin Cellar, MD  folic acid (FOLVITE) 1 MG tablet Take 1 tablet (1 mg total) by mouth daily. 04/21/16  Yes Maryann Mikhail, DO  furosemide (LASIX) 40 MG tablet Take 40 mg by mouth daily. 04/18/16  Yes Historical Provider, MD  metoprolol tartrate (LOPRESSOR) 25 MG tablet Take 1 tablet (25 mg total) by mouth 2 (two) times daily. 04/22/16  Yes Kelvin Cellar, MD  Multiple Vitamin (MULTIVITAMIN WITH MINERALS) TABS tablet Take 1 tablet by mouth daily. 04/21/16  Yes Maryann Mikhail, DO  omeprazole (PRILOSEC) 40 MG capsule Take 1 capsule (40 mg total) by mouth daily. For acid reflux 12/15/14  Yes Thurnell Lose, MD    spironolactone (ALDACTONE) 25 MG tablet Take 1 tablet by mouth daily. 02/18/16  Yes Historical Provider, MD  sucralfate (CARAFATE) 1 g tablet Take 1 tablet (1 g total) by mouth 4 (four) times daily -  with meals and at bedtime. 04/22/16  Yes Kelvin Cellar, MD  thiamine 100 MG tablet Take 1 tablet (100 mg total) by mouth daily. 04/21/16  Yes Maryann Mikhail, DO  traMADol (ULTRAM) 50 MG tablet Take 50 mg by mouth 3 (three) times daily. 03/04/16  Yes Historical Provider, MD  traZODone (DESYREL) 100 MG tablet Take 100 mg by mouth at bedtime. 02/20/16  Yes Historical Provider, MD    Family History Family History  Problem Relation Age of Onset  . Stroke Father   . Prostate cancer Brother   . Heart disease Mother     Pacemaker    Social History Social History  Substance Use Topics  . Smoking status: Former Smoker    Packs/day: 0.50  Years: 5.00    Types: Cigarettes  . Smokeless tobacco: Never Used     Comment: "quit smoking cigarettes in the 1970's"  . Alcohol use 16.8 oz/week    28 Glasses of wine per week     Comment: drinks 1 pint of wine/day      Allergies   Review of patient's allergies indicates no known allergies.   Review of Systems Review of Systems  Constitutional: Positive for fatigue. Negative for chills, diaphoresis and fever.  HENT: Negative for nosebleeds and sore throat.   Eyes: Negative for visual disturbance.  Respiratory: Negative for cough and shortness of breath.   Cardiovascular: Positive for chest pain. Negative for palpitations.  Gastrointestinal: Positive for blood in stool, diarrhea and hematochezia. Negative for abdominal pain, constipation, hematemesis, nausea and vomiting.  Genitourinary: Negative for dysuria and hematuria.  Musculoskeletal: Negative for arthralgias and back pain.  Skin: Negative for color change and rash.  Neurological: Negative for loss of consciousness, syncope and light-headedness.  Psychiatric/Behavioral: Negative for  agitation and confusion.  All other systems reviewed and are negative.    Physical Exam Updated Vital Signs BP 105/74   Pulse (!) 108   Temp 97.9 F (36.6 C) (Oral)   Resp (!) 21   Ht 6' (1.829 m)   Wt 95.8 kg   SpO2 100%   BMI 28.64 kg/m   Physical Exam  Constitutional: He is oriented to person, place, and time. He appears well-developed and well-nourished.  HENT:  Head: Normocephalic and atraumatic.  Eyes: Conjunctivae are normal.  Neck: Neck supple.  Cardiovascular: Regular rhythm.   No murmur heard. tachycardic  Pulmonary/Chest: Effort normal and breath sounds normal. No respiratory distress.  Abdominal: Soft. He exhibits no mass. There is tenderness (tenderness is distractible). There is no rebound and no guarding.  Musculoskeletal: He exhibits no edema.  Neurological: He is alert and oriented to person, place, and time. No cranial nerve deficit.  Skin: Skin is warm and dry.  Psychiatric: He has a normal mood and affect. His behavior is normal.  Nursing note and vitals reviewed.    ED Treatments / Results  Labs (all labs ordered are listed, but only abnormal results are displayed) Labs Reviewed  CBC WITH DIFFERENTIAL/PLATELET - Abnormal; Notable for the following:       Result Value   RBC 3.92 (*)    MCV 105.4 (*)    MCH 36.7 (*)    RDW 17.1 (*)    All other components within normal limits  COMPREHENSIVE METABOLIC PANEL - Abnormal; Notable for the following:    Potassium 2.9 (*)    Glucose, Bld 110 (*)    BUN <5 (*)    Calcium 7.9 (*)    Total Protein 6.2 (*)    Albumin 2.2 (*)    AST 166 (*)    ALT 64 (*)    Total Bilirubin 2.7 (*)    All other components within normal limits  PROTIME-INR - Abnormal; Notable for the following:    Prothrombin Time 19.5 (*)    INR 1.65 (*)    All other components within normal limits  AMMONIA - Abnormal; Notable for the following:    Ammonia 75 (*)    All other components within normal limits  TROPONIN I -  Abnormal; Notable for the following:    Troponin I 0.05 (*)    All other components within normal limits  TROPONIN I - Abnormal; Notable for the following:    Troponin I 0.04 (*)  All other components within normal limits  LACTIC ACID, PLASMA - Abnormal; Notable for the following:    Lactic Acid, Venous 2.8 (*)    All other components within normal limits  LACTIC ACID, PLASMA - Abnormal; Notable for the following:    Lactic Acid, Venous 2.0 (*)    All other components within normal limits  I-STAT CG4 LACTIC ACID, ED - Abnormal; Notable for the following:    Lactic Acid, Venous 3.85 (*)    All other components within normal limits  I-STAT CHEM 8, ED - Abnormal; Notable for the following:    Potassium 2.9 (*)    BUN <3 (*)    Glucose, Bld 106 (*)    Calcium, Ion 0.99 (*)    All other components within normal limits  I-STAT CG4 LACTIC ACID, ED - Abnormal; Notable for the following:    Lactic Acid, Venous 3.46 (*)    All other components within normal limits  MRSA PCR SCREENING  C DIFFICILE QUICK SCREEN W PCR REFLEX  GASTROINTESTINAL PANEL BY PCR, STOOL (REPLACES STOOL CULTURE)  CULTURE, BLOOD (ROUTINE X 2)  CULTURE, BLOOD (ROUTINE X 2)  LIPASE, BLOOD  PROCALCITONIN  APTT  MAGNESIUM  BRAIN NATRIURETIC PEPTIDE  TROPONIN I  TROPONIN I  URINALYSIS, ROUTINE W REFLEX MICROSCOPIC (NOT AT [facility])  BASIC METABOLIC PANEL  CBC  I-STAT TROPOININ, ED  POC OCCULT BLOOD, ED  TYPE AND SCREEN  PREPARE RBC (CROSSMATCH)    EKG  EKG Interpretation  Date/Time:  Tuesday April 28 2016 15:13:31 EDT Ventricular Rate:  130 PR Interval:    QRS Duration: 130 QT Interval:  356 QTC Calculation: 524 R Axis:   -134 Text Interpretation:  Atrial fibrillation Right bundle branch block irregular tachycardia intermittent P waves Confirmed by Wyvonnia Dusky  MD, STEPHEN [phone]) on 04/28/2016 3:20:49 PM       Radiology Dg Chest Portable 1 View  Result Date: 04/28/2016 CLINICAL DATA:  Midsternal on  left-sided chest pain. Tightness and pressure. EXAM: PORTABLE CHEST 1 VIEW COMPARISON:  One-view chest x-ray 04/18/2016 FINDINGS: Heart size normal. Lungs are clear. The visualized soft tissues and bony thorax are unremarkable. IMPRESSION: Negative one-view chest x-ray Electronically Signed   By: San Morelle M.D.   On: 04/28/2016 15:58   Procedures Procedures (including critical care time)  Medications Ordered in ED Medications  amiodarone (PACERONE) tablet 200 mg (200 mg Oral Given [id number])  folic acid (FOLVITE) tablet 1 mg (0 mg Oral Duplicate [id number])  metoprolol tartrate (LOPRESSOR) tablet 25 mg (25 mg Oral Given 04/28/16 2257)  multivitamin with minerals tablet 1 tablet (0 tablets Oral Duplicate [id number] [id number])  sucralfate (CARAFATE) tablet 1 g (1 g Oral Given 04/28/16 2257)  thiamine (VITAMIN B-1) tablet 100 mg (0 mg Oral Duplicate [id number] [id number])  traZODone (DESYREL) tablet 100 mg (100 mg Oral Given 04/28/16 2257)  oxyCODONE (Oxy IR/ROXICODONE) immediate release tablet 10 mg (not administered)  sodium chloride flush (NS) 0.9 % injection 3 mL (3 mLs Intravenous Given 04/28/16 2200)  LORazepam (ATIVAN) tablet 1 mg (1 mg Oral Given 04/29/16 0026)    Or  LORazepam (ATIVAN) injection 1 mg ( Intravenous See Alternative 04/29/16 0026)  multivitamin with minerals tablet 1 tablet (0 tablets Oral Duplicate [id number] [id number])  LORazepam (ATIVAN) injection 0-4 mg (0 mg Intravenous Not Given 04/28/16 2030)    Followed by  LORazepam (ATIVAN) injection 0-4 mg (not administered)  pantoprazole (PROTONIX) EC tablet 40 mg (not administered)  nitroGLYCERIN (NITROSTAT) SL tablet 0.4 mg (not  administered)  dextrose 5 %-0.45 % sodium chloride infusion ( Intravenous New Bag/Given 04/28/16 2130)  sodium chloride 0.9 % bolus 1,000 mL (0 mLs Intravenous Stopped 04/28/16 1926)  0.9 %  sodium chloride infusion (0 mL/hr Intravenous Stopped 04/28/16 1925)  metoprolol (LOPRESSOR) injection 2.5 mg (2.5 mg  Intravenous Given 04/28/16 1639)  0.9 %  sodium chloride infusion ( Intravenous New Bag/Given 04/28/16 1628)  potassium chloride 10 mEq in 100 mL IVPB (0 mEq Intravenous Stopped 04/28/16 1815)  magnesium sulfate IVPB 2 g 50 mL (0 g Intravenous Stopped 04/28/16 1836)  thiamine (B-1) injection 100 mg (100 mg Intravenous Given 04/28/16 1704)  potassium chloride 20 MEQ/15ML (10%) solution 40 mEq (40 mEq Oral Given 04/28/16 2257)  sodium chloride 0.9 % bolus 500 mL (500 mLs Intravenous Given 04/28/16 2145)     Initial Impression / Assessment and Plan / ED Course  I have reviewed the triage vital signs and the nursing notes.  Pertinent labs & imaging results that were available during my care of the patient were reviewed by me and considered in my medical decision making (see chart for details).  Clinical Course   75 year old male presenting for generalized weakness with complaints of black tarry prescription stools over the last 3-5 days. He has a history of cirrhosis and was admitted recently for similar weakness and complaints of black tarry stools. Point care fecal occult negative. Abdominal pain is distractible.  When he arrived he was in atrial fibrillation with rapid ventricular response. His pressure was 80s over 50s with heart rate in the 130s. He is given a bolus of normal saline and 2.5 of Lopressor with improvement of heart rate to the 110s.  Patient was given 10 mEq of potassium and 2 g of magnesium in the emergency department. He was given IV thiamine.  Laboratory evaluation significant for hypokalemia, lactic acidosis. Blood pressure and heart rate improved with fluid resuscitation and Lopressor. Patient admitted to hospitalist service for further evaluation and care.  Final Clinical Impressions(s) / ED Diagnoses   Final diagnoses:  Atrial fibrillation with rapid ventricular response [facility])    New Prescriptions Current Discharge Medication List       Allie Bossier, MD 04/29/16  [id number]    Ezequiel Essex, MD 04/29/16 [phone]

## 2016-04-28 NOTE — ED Triage Notes (Signed)
Pt arrives via [facility] for c/o abdominal pain x2 weeks, pt has been seen by pcp and in ed with no diagnosis. Pt reports 6 days ago he began having dark tarry stools and chest pain that began 3 days ago. Ems reports pt is in afib at a rate of 130 with hx of same. Pt takes amiodarone for afib

## 2016-04-28 NOTE — ED Notes (Signed)
Dr. Doree Albee made aware that patient is now sinus tach on the monitor, ekg captured. MD advised patient may get another liter of normal saline with lopressor that he is ordering

## 2016-04-28 NOTE — ED Notes (Signed)
MD at bedside. 

## 2016-04-29 ENCOUNTER — Inpatient Hospital Stay (HOSPITAL_COMMUNITY): Payer: Medicare Other

## 2016-04-29 DIAGNOSIS — I10 Essential (primary) hypertension: Secondary | ICD-10-CM | POA: Diagnosis not present

## 2016-04-29 DIAGNOSIS — R079 Chest pain, unspecified: Secondary | ICD-10-CM | POA: Diagnosis not present

## 2016-04-29 DIAGNOSIS — I4891 Unspecified atrial fibrillation: Secondary | ICD-10-CM

## 2016-04-29 DIAGNOSIS — R1030 Lower abdominal pain, unspecified: Secondary | ICD-10-CM | POA: Diagnosis not present

## 2016-04-29 DIAGNOSIS — F1010 Alcohol abuse, uncomplicated: Secondary | ICD-10-CM | POA: Diagnosis not present

## 2016-04-29 DIAGNOSIS — I9589 Other hypotension: Secondary | ICD-10-CM

## 2016-04-29 DIAGNOSIS — R109 Unspecified abdominal pain: Secondary | ICD-10-CM | POA: Diagnosis not present

## 2016-04-29 LAB — URINALYSIS, ROUTINE W REFLEX MICROSCOPIC
Glucose, UA: NEGATIVE mg/dL
Hgb urine dipstick: NEGATIVE
KETONES UR: 15 mg/dL — AB
NITRITE: NEGATIVE
PROTEIN: NEGATIVE mg/dL
Specific Gravity, Urine: 1.023 (ref 1.005–1.030)
pH: 6 (ref 5.0–8.0)

## 2016-04-29 LAB — BASIC METABOLIC PANEL
Anion gap: 5 (ref 5–15)
BUN: <5 mg/dL — ABNORMAL LOW (ref 6–20)
CHLORIDE: 116 mmol/L — AB (ref 101–111)
CO2: 18 mmol/L — AB (ref 22–32)
CREATININE: 0.83 mg/dL (ref 0.61–1.24)
Calcium: 7.1 mg/dL — ABNORMAL LOW (ref 8.9–10.3)
GFR calc non Af Amer: >60 mL/min (ref >60)
GLUCOSE: 159 mg/dL — AB (ref 65–99)
Potassium: 3.1 mmol/L — ABNORMAL LOW (ref 3.5–5.1)
Sodium: 139 mmol/L (ref 135–145)

## 2016-04-29 LAB — CBC
HCT: 35.1 % — ABNORMAL LOW (ref 39.0–52.0)
HEMOGLOBIN: 12 g/dL — AB (ref 13.0–17.0)
MCH: 36.3 pg — AB (ref 26.0–34.0)
MCHC: 34.2 g/dL (ref 30.0–36.0)
MCV: 106 fL — AB (ref 78.0–100.0)
Platelets: 101 10*3/uL — ABNORMAL LOW (ref 150–400)
RBC: 3.31 MIL/uL — AB (ref 4.22–5.81)
RDW: 17 % — ABNORMAL HIGH (ref 11.5–15.5)
WBC: 4 10*3/uL (ref 4.0–10.5)

## 2016-04-29 LAB — GASTROINTESTINAL PANEL BY PCR, STOOL (REPLACES STOOL CULTURE)
ADENOVIRUS F40/41: NOT DETECTED
ASTROVIRUS: NOT DETECTED
CAMPYLOBACTER SPECIES: NOT DETECTED
Cryptosporidium: NOT DETECTED
Cyclospora cayetanensis: NOT DETECTED
E. COLI O157: NOT DETECTED
ENTEROPATHOGENIC E COLI (EPEC): NOT DETECTED
ENTEROTOXIGENIC E COLI (ETEC): NOT DETECTED
Entamoeba histolytica: NOT DETECTED
Enteroaggregative E coli (EAEC): NOT DETECTED
Giardia lamblia: NOT DETECTED
NOROVIRUS GI/GII: NOT DETECTED
PLESIMONAS SHIGELLOIDES: NOT DETECTED
ROTAVIRUS A: NOT DETECTED
SAPOVIRUS (I, II, IV, AND V): NOT DETECTED
SHIGA LIKE TOXIN PRODUCING E COLI (STEC): NOT DETECTED
Salmonella species: NOT DETECTED
Shigella/Enteroinvasive E coli (EIEC): NOT DETECTED
Vibrio cholerae: NOT DETECTED
Vibrio species: NOT DETECTED
Yersinia enterocolitica: NOT DETECTED

## 2016-04-29 LAB — URINE MICROSCOPIC-ADD ON: RBC / HPF: NONE SEEN RBC/hpf (ref 0–5)

## 2016-04-29 LAB — TROPONIN I
TROPONIN I: 0.04 ng/mL — AB (ref <0.03)
Troponin I: 0.05 ng/mL (ref <0.03)

## 2016-04-29 LAB — C DIFFICILE QUICK SCREEN W PCR REFLEX
C DIFFICILE (CDIFF) INTERP: NOT DETECTED
C DIFFICILE (CDIFF) TOXIN: NEGATIVE
C DIFFICLE (CDIFF) ANTIGEN: NEGATIVE

## 2016-04-29 LAB — MAGNESIUM: Magnesium: 1.6 mg/dL — ABNORMAL LOW (ref 1.7–2.4)

## 2016-04-29 LAB — BRAIN NATRIURETIC PEPTIDE: B NATRIURETIC PEPTIDE 5: 413.5 pg/mL — AB (ref 0.0–100.0)

## 2016-04-29 MED ORDER — SIMETHICONE 80 MG PO CHEW
160.0000 mg | CHEWABLE_TABLET | Freq: Four times a day (QID) | ORAL | Status: DC
  Administered 2016-04-29 – 2016-05-01 (×10): 160 mg via ORAL
  Filled 2016-04-29 (×10): qty 2

## 2016-04-29 MED ORDER — MAGNESIUM SULFATE 4 GM/100ML IV SOLN
4.0000 g | Freq: Once | INTRAVENOUS | Status: AC
  Administered 2016-04-29: 4 g via INTRAVENOUS
  Filled 2016-04-29: qty 100

## 2016-04-29 NOTE — Evaluation (Signed)
Physical Therapy Evaluation Patient Details Name: Nathan Frank MRN: [id number] DOB: 13-Dec-1940 Today's Date: 04/29/2016   History of Present Illness  75 y.o. male admitted for intermittent abdominal pain, dark tarry stool, and chest pain. Pt dx with A-fib with RVR rates in the 120s, N/V/D and abdominal pain GI pannel pending, on c-diff precautions, elevated lactic acid level, hypotension, and hypokalemia/hypomgnesemia.  PMH significant for HTN, MI, anemia, hepatitis C, poly substance abuse, neuropathy, upper GI bleeding, GERD, and hx of DVT.   Clinical Impression  Pt is likely generally weak from acute illness.  He was supervision to walk to the bathroom with RW to have a BM.  He reports that he does have a RW to use at home if he still feels weaker when he leaves the hospital vs his baseline device of a SPC.   PT to follow acutely for deficits listed below.      Follow Up Recommendations Supervision for mobility/OOB;No PT follow up    Equipment Recommendations  None recommended by PT    Recommendations for Other Services   NA    Precautions / Restrictions Precautions Precautions: Fall Restrictions Weight Bearing Restrictions: No      Mobility  Bed Mobility Overal bed mobility: Needs Assistance Bed Mobility: Rolling;Supine to Sit;Sit to Supine Rolling: Min assist   Supine to sit: Min assist Sit to supine: Min assist   General bed mobility comments: Min hand held assist to pull to sitting, min assist to help pt lift legs back into bed.    Transfers Overall transfer level: Needs assistance Equipment used: Rolling walker (2 wheeled) Transfers: Sit to/from Stand Sit to Stand: Min guard         General transfer comment: Min guard assist to support trunk   Ambulation/Gait Ambulation/Gait assistance: Supervision Ambulation Distance (Feet): 20 Feet Assistive device: Rolling walker (2 wheeled) Gait Pattern/deviations: Step-through pattern;Trunk flexed Gait velocity:  decreased   General Gait Details: pt with slow, shuffling gait pattern with increased trunk flexion (improved with adjustment of RW height up to his height).  Pt reports he does have a RW at home if needed at discharge if he feels too weak to be safe with the cane.          Balance Overall balance assessment: Needs assistance Sitting-balance support: Feet supported;No upper extremity supported Sitting balance-Leahy Scale: Good     Standing balance support: Bilateral upper extremity supported Standing balance-Leahy Scale: Fair                               Pertinent Vitals/Pain Pain Assessment: No/denies pain Faces Pain Scale: No hurt Pain Location: stomach Pain Descriptors / Indicators: Aching Pain Intervention(s): Monitored during session;Repositioned    Home Living Family/patient expects to be discharged to:: Private residence Living Arrangements: Spouse/significant other Available Help at Discharge: Family;Available 24 hours/day Type of Home: House Home Access: Stairs to enter Entrance Stairs-Rails: Right Entrance Stairs-Number of Steps: 4 Home Layout: One level Home Equipment: Cane - single point;Grab bars - tub/shower;Hand held shower head;Walker - 2 wheels      Prior Function Level of Independence: Independent with assistive device(s)         Comments: Uses SPC for community mobility     Hand Dominance   Dominant Hand: Right    Extremity/Trunk Assessment   Upper Extremity Assessment: Defer to OT evaluation           Lower Extremity Assessment: Generalized  weakness      Cervical / Trunk Assessment: Normal  Communication   Communication: No difficulties  Cognition Arousal/Alertness: Awake/alert Behavior During Therapy: Flat affect Overall Cognitive Status: Within Functional Limits for tasks assessed (not specifically tested)                      General Comments General comments (skin integrity, edema, etc.): HR stayed  in 80s-low 100s during gait          Assessment/Plan    PT Assessment Patient needs continued PT services  PT Diagnosis Difficulty walking;Abnormality of gait;Generalized weakness   PT Problem List Decreased strength;Decreased activity tolerance;Decreased balance;Decreased mobility;Decreased knowledge of use of DME;Cardiopulmonary status limiting activity  PT Treatment Interventions DME instruction;Stair training;Gait training;Functional mobility training;Therapeutic activities;Therapeutic exercise;Balance training;Patient/family education   PT Goals (Current goals can be found in the Care Plan section) Acute Rehab PT Goals Patient Stated Goal: to get some rest PT Goal Formulation: With patient Time For Goal Achievement: 05/13/16 Potential to Achieve Goals: Good    Frequency Min 3X/week           End of Session   Activity Tolerance: Patient limited by fatigue (pt reports no sleep in the last 24 hours) Patient left: in bed;with call bell/phone within reach           Time: 1356-1420 PT Time Calculation (min) (ACUTE ONLY): 24 min   Charges:   PT Evaluation $PT Eval Moderate Complexity: 1 Procedure PT Treatments $Therapeutic Activity: 8-22 mins        Izen Petz B. [place], [place], DPT [phone]   04/29/2016, 2:25 PM

## 2016-04-29 NOTE — Progress Notes (Signed)
Occupational Therapy Evaluation Patient Details Name: Nathan Frank MRN: [id number] DOB: 1941-04-18 Today's Date: 04/29/2016    History of Present Illness 75 y.o. male admitted for intermittent abdominal pain, dark tarry stool, and chest pain. PMH significant for HTN, MI, anemia, hepatitis C, poly substance abuse, neuropathy, upper GI bleeding, GERD, and hx of DVT.    Clinical Impression   PTA, pt reports he was independent with all ADLs and used [facility] for community mobility. Evaluation limited as pt refused to mobilize further than sitting EOB with no specific reason given. Pt able to complete bed mobility with min assist and seated ADLs with setup provided. Pt's HR increased to 123 sitting EOB, pt asymptomatic (see vitals section for more details). Pt plans to d/c home with 24/7 assistance from his wife. Pt will benefit from continued acute OT to increase independence and safety with ADLs and mobility to allow for safe discharge home. No OT follow up or DME recommendations at this time. Will continue to follow acutely.    Follow Up Recommendations  No OT follow up;Supervision/Assistance - 24 hour    Equipment Recommendations  None recommended by OT    Recommendations for Other Services       Precautions / Restrictions Precautions Precautions: Fall Restrictions Weight Bearing Restrictions: No      Mobility Bed Mobility Overal bed mobility: Needs Assistance Bed Mobility: Rolling;Supine to Sit;Sit to Supine Rolling: Min guard   Supine to sit: Min guard Sit to supine: Min assist   General bed mobility comments: Assist provided to pull up in bed, otherwise no physical assistance needed - min guard assist for safety.  Transfers                 General transfer comment: Pt refused to mobilize further than EOB with no specific reason given despite discussion on importance of mobilizing when in the hospital.    Balance Overall balance assessment: Needs  assistance Sitting-balance support: No upper extremity supported;Feet supported Sitting balance-Leahy Scale: Good                                      ADL Overall ADL's : Needs assistance/impaired     Grooming: Wash/dry hands;Wash/dry face;Set up;Sitting   Upper Body Bathing: Set up;Sitting   Lower Body Bathing: Set up;Sitting/lateral leans Lower Body Bathing Details (indicate cue type and reason): able to reach feet Upper Body Dressing : Set up;Sitting   Lower Body Dressing: Set up;Sitting/lateral leans Lower Body Dressing Details (indicate cue type and reason): able to reach feet               General ADL Comments: Pt refused to mobilize further than EOB with no specific reason given     Vision Vision Assessment?: No apparent visual deficits   Perception     Praxis      Pertinent Vitals/Pain Pain Assessment: Faces Faces Pain Scale: Hurts a little bit Pain Location: stomach Pain Descriptors / Indicators: Aching Pain Intervention(s): Monitored during session;Repositioned  Supine (beginning of session): HR 100, SpO2 100% on RA Sitting EOB: HR 110-123, SpO2 100% on RA Supine (end of session): HR 120; SpO2 100% on RA     Hand Dominance Right   Extremity/Trunk Assessment Upper Extremity Assessment Upper Extremity Assessment: Overall WFL for tasks assessed   Lower Extremity Assessment Lower Extremity Assessment: Defer to PT evaluation   Cervical / Trunk Assessment Cervical /  Trunk Assessment: Normal   Communication Communication Communication: No difficulties   Cognition Arousal/Alertness: Awake/alert Behavior During Therapy: Flat affect Overall Cognitive Status: Within Functional Limits for tasks assessed                     General Comments       Exercises       Shoulder Instructions      Home Living Family/patient expects to be discharged to:: Private residence Living Arrangements: Spouse/significant other Available  Help at Discharge: Family;Available 24 hours/day Type of Home: House Home Access: Stairs to enter [employer] of Steps: 4 Entrance Stairs-Rails: Right Home Layout: One level     Bathroom Shower/Tub: Occupational psychologist: Handicapped height     Home Equipment: [place] - single point;Grab bars - tub/shower;Hand held shower head          Prior Functioning/Environment Level of Independence: Independent with assistive device(s)        Comments: Uses [facility] for community mobility    OT Diagnosis: Generalized weakness;Acute pain   OT Problem List: Decreased strength;Decreased activity tolerance;Impaired balance (sitting and/or standing);Decreased safety awareness;Decreased knowledge of use of DME or AE;Pain   OT Treatment/Interventions: Self-care/ADL training;Therapeutic exercise;DME and/or AE instruction;Cognitive remediation/compensation;Patient/family education;Balance training    OT Goals(Current goals can be found in the care plan section) Acute Rehab OT Goals Patient Stated Goal: to be left alone OT Goal Formulation: With patient Time For Goal Achievement: 05/13/16 Potential to Achieve Goals: Good ADL Goals Pt Will Perform Grooming: (P) with modified independence;standing Pt Will Perform Upper Body Bathing: (P) with modified independence;sitting Pt Will Perform Lower Body Bathing: (P) with modified independence;sit to/from stand Pt Will Transfer to Toilet: (P) with modified independence;ambulating;regular height toilet Pt Will Perform Toileting - Clothing Manipulation and hygiene: (P) with modified independence;sit to/from stand  OT Frequency: Min 2X/week   Barriers to D/C:            Co-evaluation              End of Session Nurse Communication: Mobility status  Activity Tolerance: Patient tolerated treatment well Patient left: in bed;with call bell/phone within reach;with bed alarm set;with SCD's reapplied   Time: [id number] OT Time  Calculation (min): 18 min Charges:  OT General Charges $OT Visit: 1 Procedure OT Evaluation $OT Eval Moderate Complexity: 1 Procedure G-Codes:    Redmond Baseman, OTR/L [phone] 04/29/2016, 11:56 AM

## 2016-04-29 NOTE — Progress Notes (Signed)
Pt refusing morning labs. Pt educated on importance of labs. Will cont to monitor pt.

## 2016-04-29 NOTE — Progress Notes (Signed)
PROGRESS NOTE    Nathan Frank  [id number] DOB: 1941-08-21 DOA: 04/28/2016 PCP: Thressa Sheller, MD    Brief Narrative:  Nathan Frank is a 75 y.o. male with medical history significant of atrial fibrillation not on anticoagulation due to hx of GI bleeding, liver cirrhosis, HCV, alcohol abuse, heroin abuse, DVT, gastric ulcer, GI bleeding, CAD, medication noncompliance, who presents with diarrhea, abdominal pain, chest pain.  Patient reports that he has been having intermittent abdominal pain for more than one week. It is located in the lower abdomen, worse on the right lower quadrant, 8 out of 10 in severity, nonradiating. Patient states that he has diarrhea in the past 2 days. He has 5-6 watery bowel movement each day, with black colored stool. He denies nausea, vomiting, fever or chills. He also reports chest pain which has been going on intermittently for 3 days. His chest pain is located in the left chest, 6 out of 10 in severity, nonradiating, non-pleuritic. He does not have shortness of breath, cough. No tenderness over calf areas. He states that he has dysuria and burning on urination sometimes. No rashes or unilateral weakness. Pt was initially hypotensive with Bp 89/67 which improved to 103/83 after 2 L normal saline bolus in ED. He was in A fib with RVR with HR 123 in ED.  ED Course: pt was found to have hemoglobin 14.4, negative FOBT, WBC 5.2, temperature normal, oxygen saturation 97% on room air, potassium 2.9, creatinine 1.09, negative chest x-ray, lactate is 3.85, INR 1.65, troponin 0.05. Pending UA. Patient is admitted to stepdown bed as inpt for further eval and treatment.    Assessment & Plan:   Principal Problem:   Chest pain Active Problems:   Alcohol abuse   Hepatitis C   Cirrhosis (HCC)   Abdominal pain   Upper GI bleeding   Essential hypertension   Atrial fibrillation with RVR (HCC)   Hypotension   Elevated lactic acid level   Chronic atrial  fibrillation (HCC)   SIRS (systemic inflammatory response syndrome) (HCC)   Atrial fibrillation with rapid ventricular response (HCC)   Diarrhea   #1 chest pain Patient presented with chest pain noted to have A. fib with RVR with heart rates no 123 6 on admission and slightly elevated troponin of 0.05. Patient was also noted to be hypotensive with blood pressure of 89/67 which responded to IV fluids. Patient denies any ongoing chest pain. Cardiac enzymes minimally elevated. 2-D echo from 04/20/2016 with a EF of 55-60% with no wall motion abnormalities grade 2 diastolic dysfunction. Patient noted to have a previous non-STEMI 2016 were troponins were as high as 7.72. Patient with normal coronaries. Patient has been seen in consultation by cardiology who recommend resumption of amiodarone and replacement of electrolytes with no further cardiac workup needed at this time. Follow.  #2 nausea vomiting diarrhea/abdominal pain Questionable etiology. Patient underwent recent workup during last hospitalization. Patient complaining of black stools however FOBT is negative. Patient with prior history of cirrhosis. Patient with some lower abdominal pain to palpation. Urinalysis small leukocytes nitrite -0-5 WBCs. Will get a KUB. Replete electrolytes. GI pathogen panel pending. Place on scheduled simethicone. Follow.  #3 A. fib with RVR CHA2DS2VASC 4 Heart rate in the 123's on admission which improved to 110 with IV fluids. Recent TSH within normal limits as well as free T4. Amiodarone has been resumed per cardiology. Continue metoprolol. Patient not on anticoagulation due to high risk for GI bleed.  #4 elevated lactic acid level  Differential includes systemic inflammatory response versus sepsis versus hypoperfusion secondary to hypotension and starvation due to alcohol abuse.  #5 hypotension Likely secondary to hypovolemic hypovolemia secondary to diarrhea and severe tachycardia. Improved with hydration.  Follow.  #6 hypokalemia/hypomagnesemia Likely secondary to GI losses and chronic alcohol use. Patient refused labs this morning. Replete.  #7 history of gastric ulcer and upper GI bleed Vision complaining of black stools however FOBT is negative. H&H is stable. Continue PPI.  #8 hepatitis C and cirrhosis Stable. No signs of hepatic encephalopathy. Continue Lasix and spironolactone.  #9 alcohol abuse  Ativan CIWA protocol     DVT prophylaxis: SCDs Code Status: Full Family Communication: Updated patient and mother at bedside. Disposition Plan: Home when medically stable and diarrhea has improved with no further nausea or emesis.   Consultants:   Cardiology: Dr. Sallyanne Kuster 04/29/2016  Procedures:   Chest x-ray 04/28/2016    Antimicrobials:   None   Subjective: Patient denies any current chest pain. Patient denies any shortness of breath. Patient complaining of lower abdominal pain as well as black loose stools. Patient also complaining of belching.  Objective: Vitals:   04/29/16 0013 04/29/16 0100 04/29/16 0559 04/29/16 0739  BP: (!) 89/64 105/74 110/81 94/74  Pulse: (!) 119 (!) 108 69 91  Resp: (!) 21 (!) 21 17 20   Temp: 97.9 F (36.6 C)  97.6 F (36.4 C) 97.8 F (36.6 C)  TempSrc: Oral  Oral Oral  SpO2: 97% 100% 99% 98%  Weight:   96.8 kg (213 lb 8 oz)   Height:        Intake/Output Summary (Last 24 hours) at 04/29/16 1037 Last data filed at 04/29/16 0645  Gross per 24 hour  Intake             2970 ml  Output              170 ml  Net             2800 ml   Filed Weights   04/28/16 2100 04/29/16 0559  Weight: 95.8 kg (211 lb 3.2 oz) 96.8 kg (213 lb 8 oz)    Examination:  General exam: Appears calm and comfortable  Respiratory system: Clear to auscultation. Respiratory effort normal. Cardiovascular system: S1 & S2 heard, RRR. No JVD, murmurs, rubs, gallops or clicks. 1-2+ bilateral lower extremity edema Gastrointestinal system: Abdomen is  nondistended, soft and Tender to palpation in the suprapubic region and lower abdomen. No organomegaly or masses felt. Normal bowel sounds heard. Central nervous system: Alert and oriented. No focal neurological deficits. Extremities: Symmetric 5 x 5 power. Skin: No rashes, lesions or ulcers Psychiatry: Judgement and insight appear normal. Mood & affect appropriate.     Data Reviewed: I have personally reviewed following labs and imaging studies  CBC:  Recent Labs Lab 04/28/16 1536 04/28/16 1552  WBC 5.2  --   NEUTROABS 3.3  --   HGB 14.4 16.0  HCT 41.3 47.0  MCV 105.4*  --   PLT 154  --    Basic Metabolic Panel:  Recent Labs Lab 04/28/16 1536 04/28/16 1552 04/29/16 0305  NA 140 145  --   K 2.9* 2.9*  --   CL 107 103  --   CO2 24  --   --   GLUCOSE 110* 106*  --   BUN <5* <3*  --   CREATININE 1.09 0.90  --   CALCIUM 7.9*  --   --   MG  --   --  1.6*   GFR: Estimated Creatinine Clearance: 85.6 mL/min (by C-G formula based on SCr of 0.9 mg/dL). Liver Function Tests:  Recent Labs Lab 04/28/16 1536  AST 166*  ALT 64*  ALKPHOS 100  BILITOT 2.7*  PROT 6.2*  ALBUMIN 2.2*    Recent Labs Lab 04/28/16 2040  LIPASE 30    Recent Labs Lab 04/28/16 1536  AMMONIA 75*   Coagulation Profile:  Recent Labs Lab 04/28/16 1536  INR 1.65*   Cardiac Enzymes:  Recent Labs Lab 04/28/16 1536 04/28/16 2040 04/29/16 0305  TROPONINI 0.05* 0.04* 0.05*   BNP (last 3 results) No results for input(s): PROBNP in the last 8760 hours. HbA1C: No results for input(s): HGBA1C in the last 72 hours. CBG: No results for input(s): GLUCAP in the last 168 hours. Lipid Profile: No results for input(s): CHOL, HDL, LDLCALC, TRIG, CHOLHDL, LDLDIRECT in the last 72 hours. Thyroid Function Tests: No results for input(s): TSH, T4TOTAL, FREET4, T3FREE, THYROIDAB in the last 72 hours. Anemia Panel: No results for input(s): VITAMINB12, FOLATE, FERRITIN, TIBC, IRON, RETICCTPCT in  the last 72 hours. Sepsis Labs:  Recent Labs Lab 04/28/16 1552 04/28/16 1819 04/28/16 2040 04/28/16 2046 04/28/16 2258  PROCALCITON  --   --  <0.10  --   --   LATICACIDVEN 3.85* 3.46*  --  2.8* 2.0*    Recent Results (from the past 240 hour(s))  MRSA PCR Screening     Status: None   Collection Time: 04/28/16  9:16 PM  Result Value Ref Range Status   MRSA by PCR NEGATIVE NEGATIVE Final    Comment:        The GeneXpert MRSA Assay (FDA approved for NASAL specimens only), is one component of a comprehensive MRSA colonization surveillance program. It is not intended to diagnose MRSA infection nor to guide or monitor treatment for MRSA infections.   C difficile quick scan w PCR reflex     Status: None   Collection Time: 04/29/16  1:06 AM  Result Value Ref Range Status   C Diff antigen NEGATIVE NEGATIVE Final   C Diff toxin NEGATIVE NEGATIVE Final   C Diff interpretation No C. difficile detected.  Final         Radiology Studies: Dg Chest Portable 1 View  Result Date: 04/28/2016 CLINICAL DATA:  Midsternal on left-sided chest pain. Tightness and pressure. EXAM: PORTABLE CHEST 1 VIEW COMPARISON:  One-view chest x-ray 04/18/2016 FINDINGS: Heart size normal. Lungs are clear. The visualized soft tissues and bony thorax are unremarkable. IMPRESSION: Negative one-view chest x-ray Electronically Signed   By: San Morelle M.D.   On: 04/28/2016 15:58       Scheduled Meds: . amiodarone  200 mg Oral BID  . folic acid  1 mg Oral Daily  . LORazepam  0-4 mg Intravenous Q6H   Followed by  . [START ON 04/30/2016] LORazepam  0-4 mg Intravenous Q12H  . magnesium sulfate 1 - 4 g bolus IVPB  4 g Intravenous Once  . metoprolol tartrate  25 mg Oral BID  . multivitamin with minerals  1 tablet Oral Daily  . multivitamin with minerals  1 tablet Oral Daily  . pantoprazole  40 mg Oral Q1200  . sodium chloride flush  3 mL Intravenous Q12H  . sucralfate  1 g Oral TID WC & HS  .  thiamine  100 mg Oral Daily  . traZODone  100 mg Oral QHS   Continuous Infusions: . dextrose 5 % and 0.45% NaCl 100  mL/hr at 04/28/16 2130     LOS: 1 day    Time spent: 21 minutes    Talan Gildner, MD [facility] Pager [phone]  If 7PM-7AM, please contact night-coverage [email or web address] Password [facility] 04/29/2016, 10:37 AM

## 2016-04-29 NOTE — Consult Note (Signed)
CARDIOLOGY CONSULT NOTE   Patient ID: Nathan Frank MRN: [id number] DOB/AGE: [birth year] 76 y.o.  Admit date: 04/28/2016  Primary Physician   Thressa Sheller, MD Primary Cardiologist   Dr Harrington Challenger, Dr Lovena Le Reason for Consultation   Chest pain Requesting MD: Blaine Hamper  Nathan Frank is a 75 y.o. year old male with a history of atrial fibrillation not on anticoagulation due to hx of GI bleeding, RBBB, SVT, liver cirrhosis, HCV, alcohol abuse, heroin abuse, marijuana abuse, DVT, gastric ulcer, medication noncompliance.   NSTEMI 04/2015 w/ peak troponin 7.72, nl cors at cath with Dr Tamala Julian suggesting "Elevated myocardial markers for injury raises the possibility of coronary spasm, embolus, stress cardiomyopathy (atypical), and aborted infarction related to plaque rupture with thrombosis and dissolution." EF 45-50% w/ diffuse HK not involving the apex and mildly elevated LVEDP.   Admit 07/15-07/19 for hypotension, diarrhea. Had SVT, paroxysmal atrial fib/flutter and atrial tachycardia, started on amiodarone by Dr Lovena Le. F/u arranged 08/01 w/ EP.   Admitted 07/25 with diarrhea, abdominal pain and chest pain, cards asked to see.   Nathan Frank has been having chest pain for the last few months. He may get it every day one week, but not that often the next week.  It is a pressure, reaching a 7/10. It does not wake him. He says it is not exertional but if he got on a treadmill, he would probably get it. It is not related to meals. It will last hours. He has not tried any medications at home for this pain, has never had SL NTG. He did not mention that it reminded him of his heart attack.   He was having this pain in the ER last night. He got MgSO4, Lopressor 2.5 mg IV, KCl IV, and Carafate, but does not know of anything that helped, except maybe the Carafate. He did not get any pain medications or nitro. The pain finally resolved. It has not returned.   He has occasional palpitations, but cannot  say when or how long they lasted. He does not mention having palpitations during the chest pain. He remembers coming to the hospital because his HR was so high, but the notes at that time state he did not feel the elevated HR. He is not aware that his heart rate is irregular now.   He has not had ETOH since d/c from the hospital 07/19. No drug use. He says a medication from the last d/c made him urinate all the time, but was no aware he was supposed to stop taking the Lasix or the spiro. He says he got his medicine filled, is not clear on what he was taking (may not have been taking the amiodarone). He has been having multiple black and tarry stools daily with abdominal pain. No fevers.    Past Medical History:  Diagnosis Date  . Abnormal TSH   . Acute gastric ulcer   . Acute gastritis with hemorrhage   . Alcohol abuse   . Alcohol dependence ([place]) 02/02/2014  . Anemia   . Cirrhosis ([place]) 06/04/2012  . Depressive disorder 02/01/2014  . Dizziness and giddiness 12/13/2014  . DVT of lower limb, acute ([place]) 06/06/2012  . Essential hypertension   . Fatty liver   . Gallstones   . Gastritis 06/07/2012  . GERD (gastroesophageal reflux disease)   . GI bleed due to NSAIDs 12/13/2014  . Granulomatous gastritis   . Hematuria 06/04/2012  . Hepatitis C   . Heroin abuse    "  I haven't done that since I don't know when."  . Heroin overdose 02/20/2014  . LV dysfunction    a. 04/2015: EF 45-50% by cath.  . Neuropathy ([place])   . NSTEMI (non-ST elevated myocardial infarction) ([place])    a. 04/2015 - patent coronaries. Etiology possibly due to coronary spasm versus embolus, stress cardiomyopathy (atypical), and aborted infarction related to plaque rupture with thrombosis and dissolution. Amlodipine started. Not on antiplatelets due to GIB/cirrhosis history.  . Oral thrush 06/05/2012  . Polysubstance abuse    Rare marijuana.  No EtOH x 2 months.   . Prolonged Q-T interval on ECG    a. 12/2014 - treated with magnesium.  .  Right knee pain 12/13/2014  . S/P alcohol detoxification 02/02/2014  . SVT (supraventricular tachycardia) ([place])    a. 12/2014 in setting of GIB, ETOH, NSAIDS, gastritis.  . Symptomatic cholelithiasis 12/15/2013  . Thrombocytopenia ([place])   . Upper GI bleeding 12/13/2014  . Weight loss 06/04/2012     Past Surgical History:  Procedure Laterality Date  . CARDIAC CATHETERIZATION N/A 04/15/2015   Procedure: Left Heart Cath and Coronary Angiography;  Surgeon: Belva Crome, MD;  Location: [facility];  Service: Cardiovascular;  Laterality: N/A;  . CIRCUMCISION    . ESOPHAGOGASTRODUODENOSCOPY  06/07/2012   Procedure: ESOPHAGOGASTRODUODENOSCOPY (EGD);  Surgeon: Milus Banister, MD;  Location: [facility];  Service: Endoscopy;  Laterality: N/A;  may need treatment of varices  . ESOPHAGOGASTRODUODENOSCOPY N/A 12/14/2014   Procedure: ESOPHAGOGASTRODUODENOSCOPY (EGD);  Surgeon: Jerene Bears, MD;  Location[facility] ENDOSCOPY;  Service: Endoscopy;  Laterality: N/A;  . TONSILLECTOMY      No Known Allergies  I have reviewed the patient's current medications . amiodarone  200 mg Oral BID  . folic acid  1 mg Oral Daily  . LORazepam  0-4 mg Intravenous Q6H   Followed by  . [START ON 04/30/2016] LORazepam  0-4 mg Intravenous Q12H  . metoprolol tartrate  25 mg Oral BID  . multivitamin with minerals  1 tablet Oral Daily  . multivitamin with minerals  1 tablet Oral Daily  . pantoprazole  40 mg Oral Q1200  . sodium chloride flush  3 mL Intravenous Q12H  . sucralfate  1 g Oral TID WC & HS  . thiamine  100 mg Oral Daily  . traZODone  100 mg Oral QHS   . dextrose 5 % and 0.45% NaCl 100 mL/hr at 04/28/16 2130   LORazepam **OR** LORazepam, nitroGLYCERIN, oxyCODONE  Prior to Admission medications   Medication Sig Start Date End Date Taking? Authorizing Provider  amiodarone (PACERONE) 200 MG tablet Take 1 tablet (200 mg total) by mouth 2 (two) times daily. 04/22/16  Yes Kelvin Cellar, MD  folic acid (FOLVITE)  1 MG tablet Take 1 tablet (1 mg total) by mouth daily. 04/21/16  Yes Maryann Mikhail, DO  furosemide (LASIX) 40 MG tablet Take 40 mg by mouth daily. 04/18/16  Yes Historical Provider, MD  metoprolol tartrate (LOPRESSOR) 25 MG tablet Take 1 tablet (25 mg total) by mouth 2 (two) times daily. 04/22/16  Yes Kelvin Cellar, MD  Multiple Vitamin (MULTIVITAMIN WITH MINERALS) TABS tablet Take 1 tablet by mouth daily. 04/21/16  Yes Maryann Mikhail, DO  omeprazole (PRILOSEC) 40 MG capsule Take 1 capsule (40 mg total) by mouth daily. For acid reflux 12/15/14  Yes Thurnell Lose, MD  spironolactone (ALDACTONE) 25 MG tablet Take 1 tablet by mouth daily. 02/18/16  Yes Historical Provider, MD  sucralfate ([place])  1 g tablet Take 1 tablet (1 g total) by mouth 4 (four) times daily -  with meals and at bedtime. 04/22/16  Yes Kelvin Cellar, MD  thiamine 100 MG tablet Take 1 tablet (100 mg total) by mouth daily. 04/21/16  Yes Maryann Mikhail, DO  traMADol (ULTRAM) 50 MG tablet Take 50 mg by mouth 3 (three) times daily. 03/04/16  Yes Historical Provider, MD  traZODone (DESYREL) 100 MG tablet Take 100 mg by mouth at bedtime. 02/20/16  Yes Historical Provider, MD     Social History   Social History  . Marital status: Married    Spouse name: N/A  . Number of children: 3  . Years of education: N/A   Occupational History  . Retired Education officer, [employer]    Social History Main Topics  . Smoking status: Former Smoker    Packs/day: 0.50    Years: 5.00    Types: Cigarettes  . Smokeless tobacco: Never Used     Comment: "quit smoking cigarettes in the 1970's"  . Alcohol use 16.8 oz/week    28 Glasses of wine per week     Comment: drinks 1 pint of wine/day   . Drug use:     Types: Heroin  . Sexual activity: Not Currently    Birth control/ protection: Condom   Other Topics Concern  . Not on file   Social History Narrative   Lost one son to a gunshot.  Lives with wife.      Family Status  Relation Status  . Father  Deceased  . Brother Deceased  . Maternal Grandfather Deceased  . Maternal Grandmother Deceased  . Paternal Grandmother Deceased  . Paternal Grandfather Deceased  . Mother    Family History  Problem Relation Age of Onset  . Stroke Father   . Prostate cancer Brother   . Heart disease Mother     Pacemaker     ROS:  Full 14 point review of systems complete and found to be negative unless listed above.  Physical Exam: Blood pressure 110/81, pulse 69, temperature 97.6 F (36.4 C), temperature source Oral, resp. rate 17, height 6' (1.829 m), weight 213 lb 8 oz (96.8 kg), SpO2 99 %.  General: Well developed, well nourished, male in no acute distress Head: Eyes PERRLA, No xanthomas.   Normocephalic and atraumatic, oropharynx without edema or exudate. Dentition: poor Lungs: clear bilaterally; chest wall is very tender upper chest just L of sternum. Heart: Heart irregular rate and rhythm with S1, S2  murmur. pulses are 2+ all 4 extrem.   Neck: No carotid bruits. No lymphadenopathy.  JVD not elevated Abdomen: Bowel sounds present, abdomen soft and non-tender without masses or hernias noted. Msk:  No spine or cva tenderness. No weakness, no joint deformities or effusions. Extremities: No clubbing or cyanosis. No edema.  Neuro: Alert and oriented X 3. No focal deficits noted. Psych:  Good affect, responds appropriately Skin: No rashes or lesions noted.  Labs:   Lab Results  Component Value Date   WBC 5.2 04/28/2016   HGB 16.0 04/28/2016   HCT 47.0 04/28/2016   MCV 105.4 (H) 04/28/2016   PLT 154 04/28/2016    Recent Labs  04/28/16 1536  INR 1.65*    Recent Labs Lab 04/28/16 1536 04/28/16 1552  NA 140 145  K 2.9* 2.9*  CL 107 103  CO2 24  --   BUN <5* <3*  CREATININE 1.09 0.90  CALCIUM 7.9*  --   PROT 6.2*  --  BILITOT 2.7*  --   ALKPHOS 100  --   ALT 64*  --   AST 166*  --   GLUCOSE 110* 106*  ALBUMIN 2.2*  --    Magnesium  Date Value Ref Range Status    04/29/2016 1.6 (L) 1.7 - 2.4 mg/dL Final    Recent Labs  04/28/16 1536 04/28/16 2040 04/29/16 0305  TROPONINI 0.05* 0.04* 0.05*    Recent Labs  04/28/16 1550  TROPIPOC 0.03   B Natriuretic Peptide  Date/Time Value Ref Range Status  04/29/2016 03:05 AM 413.5 (H) 0.0 - 100.0 pg/mL Final  04/18/2016 06:55 PM 375.8 (H) 0.0 - 100.0 pg/mL Final   Lab Results  Component Value Date   CHOL 77 04/19/2016   HDL 11 (L) 04/19/2016   LDLCALC 36 04/19/2016   TRIG 151 (H) 04/19/2016   Lipase  Date/Time Value Ref Range Status  04/28/2016 08:40 PM 30 11 - 51 U/L Final   TSH  Date/Time Value Ref Range Status  04/19/2016 02:12 AM 4.630 (H) 0.350 - 4.500 uIU/mL Final  12/14/2014 05:52 AM 5.410 (H) 0.350 - 4.500 uIU/mL Final   Echo: 04/20/2016 - Left ventricle: The cavity size was normal. Systolic function was   normal. The estimated ejection fraction was in the range of 55%   to 60%. Wall motion was normal; there were no regional wall   motion abnormalities. Features are consistent with a pseudonormal   left ventricular filling pattern, with concomitant abnormal   relaxation and increased filling pressure (grade 2 diastolic   dysfunction). Doppler parameters are consistent with elevated   ventricular end-diastolic filling pressure. - Aortic valve: Trileaflet; normal thickness leaflets. There was no   regurgitation. - Aortic root: The aortic root was normal in size. - Mitral valve: Structurally normal valve. There was mild   regurgitation. - Left atrium: The atrium was moderately dilated. - Right ventricle: The cavity size was normal. Wall thickness was   normal. Systolic function was normal. - Right atrium: The atrium was mildly dilated. - Tricuspid valve: There was moderate regurgitation. - Pulmonary arteries: Systolic pressure was mildly increased. PA   peak pressure: 33 mm Hg (S). - Inferior vena cava: The vessel was normal in size. - Pericardium, extracardiac: There was no  pericardial effusion.  ECG:  07/25 @ 15:13 is atrial fib 07/25 16:13 is ST RBBB is old  Cath: 04/15/2015  Coronary arteries are widely patent. The right coronary, LAD, ramus, and circumflex are very tortuous.  Low normal to mildly depressed LV systolic function with an estimated EF of 45-50%. No regional wall motion abnormalities are noted.  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  Elevated myocardial markers for injury raises the possibility of coronary spasm, embolus, stress cardiomyopathy (atypical), and aborted infarction related to plaque rupture with thrombosis and dissolution.  RECOMMENDATION:  Would discontinue IV nitroglycerin.  Further management per primary team.     Radiology:  Dg Chest Portable 1 View  Result Date: 04/28/2016 CLINICAL DATA:  Midsternal on left-sided chest pain. Tightness and pressure. EXAM: PORTABLE CHEST 1 VIEW COMPARISON:  One-view chest x-ray 04/18/2016 FINDINGS: Heart size normal. Lungs are clear. The visualized soft tissues and bony thorax are unremarkable. IMPRESSION: Negative one-view chest x-ray Electronically Signed   By: San Morelle M.D.   On: 04/28/2016 15:58   ASSESSMENT AND PLAN:   The patient was seen today by Dr Sallyanne Kuster, the patient evaluated and the data reviewed.  Principal Problem:   Chest pain - minimal elevation in troponin in the setting of abnl LFTs and electrolyte imbalance is not clearly cardiac. - There is a pleuritic component, possibly MS pain. - at previous NSTEMI in 2016, troponin was as high as 7.72, nl cors - MD advise on further evaluation. EF nl on recent echo    Chronic atrial fibrillation ([place]) - he was in SR at d/c - he also had SVT and atrial tachycardia - was prescribed amio, do not believe he was taking it after d/c - with abnl LFTs, will have to watch closely although AST and ALT are lower than previous  values, TSH only mildly elevated - follow, MD advise on increasing dose for a load - This patients CHA2DS2-VASc Score and unadjusted Ischemic Stroke Rate (% per year) is equal to 4.8 % stroke rate/year from a score of 4 Above score calculated as 1 point each if present [HTN, Vascular=MI], 2 points each if present [Age > 75]  Otherwise, per IM  Active Problems:   Alcohol abuse   Hepatitis C   Cirrhosis (HCC)   Abdominal pain   Upper GI bleeding   Essential hypertension   Atrial fibrillation with RVR (H[facility])   Hypotension   Elevated lactic acid level   SIRS (systemic inflammatory response syndrome) ([place])   Atrial fibrillation with rapid ventricular response ([place])   Diarrhea   Signed: Lenoard Aden 04/29/2016 6:38 AM Bee[place] [id number]  Co-Sign MD  I have seen and examined the patient along with Barrett, Rhonda, PA-C.  I have reviewed the chart, notes and new data.  I agree with PA's note.  Key new complaints: chest pain is highly atypical Key examination changes: no overt CHF, currently irregular rhythm with borderline rate control at rest Key new findings / data: marginal abnormality in troponin, without rise and fall pattern, is not consistent with a coronary event. The ED ECG shows sinus rhythm with incessant bursts of ectopic atrial tachycardia or atrial flutter, no clear atrial fibrillation. Severe hypokalemia. Spontaneously elevate PT/INR, low albumin, undetectable BUN all suggest severe parenchymal liver insufficiency.  PLAN: Not a candidate for anticoagulation. Restart amiodarone as per Dr. Tanna Furry consult note (despite liver toxicity potential, there are no other good rate control or antiarrhythmic options). Replace K. No plans for additional CV workup on this admission.  Sanda Klein, MD, [place] [phone] 04/29/2016, 8:46 AM

## 2016-04-30 DIAGNOSIS — B182 Chronic viral hepatitis C: Secondary | ICD-10-CM

## 2016-04-30 DIAGNOSIS — R079 Chest pain, unspecified: Secondary | ICD-10-CM | POA: Diagnosis not present

## 2016-04-30 DIAGNOSIS — K7030 Alcoholic cirrhosis of liver without ascites: Secondary | ICD-10-CM

## 2016-04-30 DIAGNOSIS — F1010 Alcohol abuse, uncomplicated: Secondary | ICD-10-CM | POA: Diagnosis not present

## 2016-04-30 DIAGNOSIS — R1030 Lower abdominal pain, unspecified: Secondary | ICD-10-CM | POA: Diagnosis not present

## 2016-04-30 DIAGNOSIS — I4891 Unspecified atrial fibrillation: Secondary | ICD-10-CM | POA: Diagnosis not present

## 2016-04-30 DIAGNOSIS — I10 Essential (primary) hypertension: Secondary | ICD-10-CM | POA: Diagnosis not present

## 2016-04-30 LAB — COMPREHENSIVE METABOLIC PANEL
ALBUMIN: 1.8 g/dL — AB (ref 3.5–5.0)
ALT: 47 U/L (ref 17–63)
AST: 130 U/L — AB (ref 15–41)
Alkaline Phosphatase: 97 U/L (ref 38–126)
Anion gap: 6 (ref 5–15)
CHLORIDE: 112 mmol/L — AB (ref 101–111)
CO2: 21 mmol/L — AB (ref 22–32)
CREATININE: 0.8 mg/dL (ref 0.61–1.24)
Calcium: 7.3 mg/dL — ABNORMAL LOW (ref 8.9–10.3)
GFR calc non Af Amer: >60 mL/min (ref >60)
GLUCOSE: 123 mg/dL — AB (ref 65–99)
Potassium: 3.6 mmol/L (ref 3.5–5.1)
SODIUM: 139 mmol/L (ref 135–145)
Total Bilirubin: 1.6 mg/dL — ABNORMAL HIGH (ref 0.3–1.2)
Total Protein: 5.3 g/dL — ABNORMAL LOW (ref 6.5–8.1)

## 2016-04-30 LAB — CBC
HCT: 35.1 % — ABNORMAL LOW (ref 39.0–52.0)
Hemoglobin: 12.2 g/dL — ABNORMAL LOW (ref 13.0–17.0)
MCH: 36.7 pg — AB (ref 26.0–34.0)
MCHC: 34.8 g/dL (ref 30.0–36.0)
MCV: 105.7 fL — AB (ref 78.0–100.0)
Platelets: 118 10*3/uL — ABNORMAL LOW (ref 150–400)
RBC: 3.32 MIL/uL — AB (ref 4.22–5.81)
RDW: 17.2 % — AB (ref 11.5–15.5)
WBC: 4 10*3/uL (ref 4.0–10.5)

## 2016-04-30 LAB — MAGNESIUM: MAGNESIUM: 1.8 mg/dL (ref 1.7–2.4)

## 2016-04-30 MED ORDER — CHOLESTYRAMINE LIGHT 4 G PO PACK
4.0000 g | PACK | Freq: Every day | ORAL | Status: DC
  Administered 2016-04-30: 4 g via ORAL
  Filled 2016-04-30 (×3): qty 1

## 2016-04-30 MED ORDER — POTASSIUM CHLORIDE CRYS ER 20 MEQ PO TBCR
20.0000 meq | EXTENDED_RELEASE_TABLET | Freq: Once | ORAL | Status: AC
  Administered 2016-04-30: 20 meq via ORAL
  Filled 2016-04-30: qty 1

## 2016-04-30 MED ORDER — MAGNESIUM SULFATE 2 GM/50ML IV SOLN
2.0000 g | Freq: Once | INTRAVENOUS | Status: AC
  Administered 2016-04-30: 2 g via INTRAVENOUS
  Filled 2016-04-30: qty 50

## 2016-04-30 MED ORDER — METOPROLOL TARTRATE 12.5 MG HALF TABLET
12.5000 mg | ORAL_TABLET | Freq: Two times a day (BID) | ORAL | Status: DC
  Administered 2016-04-30 – 2016-05-01 (×2): 12.5 mg via ORAL
  Filled 2016-04-30 (×2): qty 1

## 2016-04-30 NOTE — Progress Notes (Signed)
PROGRESS NOTE    Nathan Frank  [id number] DOB: Apr 04, 1941 DOA: 04/28/2016 PCP: Thressa Sheller, MD    Brief Narrative:  Nathan Frank is a 75 y.o. male with medical history significant of atrial fibrillation not on anticoagulation due to hx of GI bleeding, liver cirrhosis, HCV, alcohol abuse, heroin abuse, DVT, gastric ulcer, GI bleeding, CAD, medication noncompliance, who presents with diarrhea, abdominal pain, chest pain.  Patient reports that he has been having intermittent abdominal pain for more than one week. It is located in the lower abdomen, worse on the right lower quadrant, 8 out of 10 in severity, nonradiating. Patient states that he has diarrhea in the past 2 days. He has 5-6 watery bowel movement each day, with black colored stool. He denies nausea, vomiting, fever or chills. He also reports chest pain which has been going on intermittently for 3 days. His chest pain is located in the left chest, 6 out of 10 in severity, nonradiating, non-pleuritic. He does not have shortness of breath, cough. No tenderness over calf areas. He states that he has dysuria and burning on urination sometimes. No rashes or unilateral weakness. Pt was initially hypotensive with Bp 89/67 which improved to 103/83 after 2 L normal saline bolus in ED. He was in A fib with RVR with HR 123 in ED.  ED Course: pt was found to have hemoglobin 14.4, negative FOBT, WBC 5.2, temperature normal, oxygen saturation 97% on room air, potassium 2.9, creatinine 1.09, negative chest x-ray, lactate is 3.85, INR 1.65, troponin 0.05. Pending UA. Patient is admitted to stepdown bed as inpt for further eval and treatment.    Assessment & Plan:   Principal Problem:   Chest pain Active Problems:   Alcohol abuse   Hepatitis C   Cirrhosis (HCC)   Abdominal pain   Upper GI bleeding   Essential hypertension   Hypotension   Elevated lactic acid level   Chronic atrial fibrillation (HCC)   SIRS (systemic inflammatory  response syndrome) (HCC)   Atrial fibrillation with rapid ventricular response (HCC)   Diarrhea   #1 chest pain Patient presented with chest pain noted to have A. fib with RVR with heart rates no 123 6 on admission and slightly elevated troponin of 0.05. Patient was also noted to be hypotensive with blood pressure of 89/67 which responded to IV fluids. Patient denies any ongoing chest pain. Cardiac enzymes minimally elevated. 2-D echo from 04/20/2016 with a EF of 55-60% with no wall motion abnormalities grade 2 diastolic dysfunction. Patient noted to have a previous non-STEMI 2016 were troponins were as high as 7.72. Patient with normal coronaries. Patient has been seen in consultation by cardiology who recommend resumption of amiodarone and replacement of electrolytes with no further cardiac workup needed at this time. Follow.  #2 nausea vomiting diarrhea/abdominal pain Questionable etiology. Patient underwent recent workup during last hospitalization. Patient complaining of black stools however FOBT is negative. Patient with prior history of cirrhosis. Patient with some lower abdominal pain to palpation which has improved. Urinalysis small leukocytes nitrite -0-5 WBCs. KUB unremarkable. Replete electrolytes. GI pathogen panel negative. Continue scheduled simethicone. Place on cholestyramine daily. Follow.  #3 A. fib with RVR CHA2DS2VASC 4 Heart rate in the 123's on admission which improved to 110 with IV fluids. Recent TSH within normal limits as well as free T4. Amiodarone has been resumed per cardiology. Metoprolol dose has been decreased per cardiology. Patient not on anticoagulation due to high risk for GI bleed.  #4 elevated lactic  acid level Differential includes systemic inflammatory response versus sepsis versus hypoperfusion secondary to hypotension and starvation due to alcohol abuse.  #5 hypotension Likely secondary to hypovolemic hypovolemia secondary to diarrhea and severe  tachycardia. Improved with hydration. Beta blocker dose has been decreased per cardiology for better perfusion and blood pressure. Follow.  #6 hypokalemia/hypomagnesemia Likely secondary to GI losses and chronic alcohol use. Patient's potassium was repleted and currently at 3.6. Magnesium at 1.8.   #7 history of gastric ulcer and upper GI bleed Patient complaining of black stools however FOBT is negative. H&H is stable. Continue PPI.  #8 hepatitis C and cirrhosis Stable. No signs of hepatic encephalopathy. Lasix and spironolactone on hold secondary to borderline blood pressure.   #9 alcohol abuse  Ativan CIWA protocol     DVT prophylaxis: SCDs Code Status: Full Family Communication: Updated patient. No family at bedside. Disposition Plan: Home when medically stable and diarrhea has improved with no further nausea or emesis.   Consultants:   Cardiology: Dr. Sallyanne Kuster 04/29/2016  Procedures:   Chest x-ray 04/28/2016  Abdominal x-ray 04/29/2016  Antimicrobials:   None   Subjective: Patient denies any current chest pain. Patient denies any shortness of breath. Patient states some improvement with lower abdominal pain. Patient still with loose stools, of which he had 3 yesterday.  Objective: Vitals:   04/29/16 1741 04/29/16 2112 04/30/16 0417 04/30/16 0745  BP: 122/86 109/83 (!) 82/61 96/80  Pulse: 99 (!) 104 88 89  Resp: 19 17 19 18   Temp: 98 F (36.7 C) 98.1 F (36.7 C) 98.7 F (37.1 C) 98 F (36.7 C)  TempSrc: Oral Oral Oral Oral  SpO2:  98% 97% 100%  Weight:   101.7 kg (224 lb 1.6 oz)   Height:        Intake/Output Summary (Last 24 hours) at 04/30/16 0958 Last data filed at 04/30/16 0900  Gross per 24 hour  Intake             1960 ml  Output              500 ml  Net             1460 ml   Filed Weights   04/28/16 2100 04/29/16 0559 04/30/16 0417  Weight: 95.8 kg (211 lb 3.2 oz) 96.8 kg (213 lb 8 oz) 101.7 kg (224 lb 1.6 oz)    Examination:  General  exam: Appears calm and comfortable  Respiratory system: Clear to auscultation. Respiratory effort normal. Cardiovascular system: S1 & S2 heard, RRR. No JVD, murmurs, rubs, gallops or clicks. 1-2+ bilateral lower extremity edema Gastrointestinal system: Abdomen is nondistended, soft and Tender to palpation in the suprapubic region and lower abdomen. No organomegaly or masses felt. Normal bowel sounds heard. Central nervous system: Alert and oriented. No focal neurological deficits. Extremities: Symmetric 5 x 5 power. Skin: No rashes, lesions or ulcers Psychiatry: Judgement and insight appear normal. Mood & affect appropriate.     Data Reviewed: I have personally reviewed following labs and imaging studies  CBC:  Recent Labs Lab 04/28/16 1536 04/28/16 1552 04/29/16 1248  WBC 5.2  --  4.0  NEUTROABS 3.3  --   --   HGB 14.4 16.0 12.0*  HCT 41.3 47.0 35.1*  MCV 105.4*  --  106.0*  PLT 154  --  99991111*   Basic Metabolic Panel:  Recent Labs Lab 04/28/16 1536 04/28/16 1552 04/29/16 0305 04/29/16 1248  NA 140 145  --  139  K  2.9* 2.9*  --  3.1*  CL 107 103  --  116*  CO2 24  --   --  18*  GLUCOSE 110* 106*  --  159*  BUN <5* <3*  --  <5*  CREATININE 1.09 0.90  --  0.83  CALCIUM 7.9*  --   --  7.1*  MG  --   --  1.6*  --    GFR: Estimated Creatinine Clearance: 94.8 mL/min (by C-G formula based on SCr of 0.83 mg/dL). Liver Function Tests:  Recent Labs Lab 04/28/16 1536  AST 166*  ALT 64*  ALKPHOS 100  BILITOT 2.7*  PROT 6.2*  ALBUMIN 2.2*    Recent Labs Lab 04/28/16 2040  LIPASE 30    Recent Labs Lab 04/28/16 1536  AMMONIA 75*   Coagulation Profile:  Recent Labs Lab 04/28/16 1536  INR 1.65*   Cardiac Enzymes:  Recent Labs Lab 04/28/16 1536 04/28/16 2040 04/29/16 0305 04/29/16 1248  TROPONINI 0.05* 0.04* 0.05* 0.04*   BNP (last 3 results) No results for input(s): PROBNP in the last 8760 hours. HbA1C: No results for input(s): HGBA1C in the  last 72 hours. CBG: No results for input(s): GLUCAP in the last 168 hours. Lipid Profile: No results for input(s): CHOL, HDL, LDLCALC, TRIG, CHOLHDL, LDLDIRECT in the last 72 hours. Thyroid Function Tests: No results for input(s): TSH, T4TOTAL, FREET4, T3FREE, THYROIDAB in the last 72 hours. Anemia Panel: No results for input(s): VITAMINB12, FOLATE, FERRITIN, TIBC, IRON, RETICCTPCT in the last 72 hours. Sepsis Labs:  Recent Labs Lab 04/28/16 1552 04/28/16 1819 04/28/16 2040 04/28/16 2046 04/28/16 2258  PROCALCITON  --   --  <0.10  --   --   LATICACIDVEN 3.85* 3.46*  --  2.8* 2.0*    Recent Results (from the past 240 hour(s))  Culture, blood (x 2)     Status: None (Preliminary result)   Collection Time: 04/28/16  8:15 PM  Result Value Ref Range Status   Specimen Description BLOOD LEFT ARM  Final   Special Requests IN PEDIATRIC BOTTLE 3CC  Final   Culture NO GROWTH < 24 HOURS  Final   Report Status PENDING  Incomplete  Culture, blood (x 2)     Status: None (Preliminary result)   Collection Time: 04/28/16  8:40 PM  Result Value Ref Range Status   Specimen Description BLOOD RIGHT ARM  Final   Special Requests BOTTLES DRAWN AEROBIC ONLY 5CC  Final   Culture NO GROWTH < 24 HOURS  Final   Report Status PENDING  Incomplete  MRSA PCR Screening     Status: None   Collection Time: 04/28/16  9:16 PM  Result Value Ref Range Status   MRSA by PCR NEGATIVE NEGATIVE Final    Comment:        The GeneXpert MRSA Assay (FDA approved for NASAL specimens only), is one component of a comprehensive MRSA colonization surveillance program. It is not intended to diagnose MRSA infection nor to guide or monitor treatment for MRSA infections.   C difficile quick scan w PCR reflex     Status: None   Collection Time: 04/29/16  1:06 AM  Result Value Ref Range Status   C Diff antigen NEGATIVE NEGATIVE Final   C Diff toxin NEGATIVE NEGATIVE Final   C Diff interpretation No C. difficile detected.   Final  Gastrointestinal Panel by PCR , Stool     Status: None   Collection Time: 04/29/16  1:06 AM  Result Value Ref  Range Status   Campylobacter species NOT DETECTED NOT DETECTED Final   Plesimonas shigelloides NOT DETECTED NOT DETECTED Final   Salmonella species NOT DETECTED NOT DETECTED Final   Yersinia enterocolitica NOT DETECTED NOT DETECTED Final   Vibrio species NOT DETECTED NOT DETECTED Final   Vibrio cholerae NOT DETECTED NOT DETECTED Final   Enteroaggregative E coli (EAEC) NOT DETECTED NOT DETECTED Final   Enteropathogenic E coli (EPEC) NOT DETECTED NOT DETECTED Final   Enterotoxigenic E coli (ETEC) NOT DETECTED NOT DETECTED Final   Shiga like toxin producing E coli (STEC) NOT DETECTED NOT DETECTED Final   E. coli O157 NOT DETECTED NOT DETECTED Final   Shigella/Enteroinvasive E coli (EIEC) NOT DETECTED NOT DETECTED Final   Cryptosporidium NOT DETECTED NOT DETECTED Final   Cyclospora cayetanensis NOT DETECTED NOT DETECTED Final   Entamoeba histolytica NOT DETECTED NOT DETECTED Final   Giardia lamblia NOT DETECTED NOT DETECTED Final   Adenovirus F40/41 NOT DETECTED NOT DETECTED Final   Astrovirus NOT DETECTED NOT DETECTED Final   Norovirus GI/GII NOT DETECTED NOT DETECTED Final   Rotavirus A NOT DETECTED NOT DETECTED Final   Sapovirus (I, II, IV, and V) NOT DETECTED NOT DETECTED Final         Radiology Studies: Dg Chest Portable 1 View  Result Date: 04/28/2016 CLINICAL DATA:  Midsternal on left-sided chest pain. Tightness and pressure. EXAM: PORTABLE CHEST 1 VIEW COMPARISON:  One-view chest x-ray 04/18/2016 FINDINGS: Heart size normal. Lungs are clear. The visualized soft tissues and bony thorax are unremarkable. IMPRESSION: Negative one-view chest x-ray Electronically Signed   By: San Morelle M.D.   On: 04/28/2016 15:58  Dg Abd 2 Views  Result Date: 04/29/2016 CLINICAL DATA:  Pt c/o general abdominal pain. Hx of acute gastric ulcer, gastritis with hemorrhage,  cirrhosis, gallstones, AND GERD. EXAM: ABDOMEN - 2 VIEW COMPARISON:  CT, 04/18/2016 FINDINGS: Normal bowel gas pattern.  No free air. No convincing renal or ureteral stones. IMPRESSION: 1. No free air. No evidence of bowel obstruction or generalized adynamic ileus. No acute findings. Electronically Signed   By: Lajean Manes M.D.   On: 04/29/2016 15:20       Scheduled Meds: . amiodarone  200 mg Oral BID  . cholestyramine light  4 g Oral Daily  . folic acid  1 mg Oral Daily  . LORazepam  0-4 mg Intravenous Q6H   Followed by  . LORazepam  0-4 mg Intravenous Q12H  . metoprolol tartrate  25 mg Oral BID  . multivitamin with minerals  1 tablet Oral Daily  . multivitamin with minerals  1 tablet Oral Daily  . pantoprazole  40 mg Oral Q1200  . simethicone  160 mg Oral QID  . sodium chloride flush  3 mL Intravenous Q12H  . sucralfate  1 g Oral TID WC & HS  . thiamine  100 mg Oral Daily  . traZODone  100 mg Oral QHS   Continuous Infusions: . dextrose 5 % and 0.45% NaCl Stopped (04/30/16 0738)     LOS: 2 days    Time spent: 59 minutes    THOMPSON,DANIEL, MD [facility] Pager [phone]  If 7PM-7AM, please contact night-coverage [email or web address] Password [facility] 04/30/2016, 9:58 AM

## 2016-04-30 NOTE — Progress Notes (Signed)
Patient Name: Nathan Frank Date of Encounter: 04/30/2016  Principal Problem:   Chest pain Active Problems:   Alcohol abuse   Hepatitis C   Cirrhosis (HCC)   Abdominal pain   Upper GI bleeding   Essential hypertension   Hypotension   Elevated lactic acid level   Chronic atrial fibrillation (HCC)   SIRS (systemic inflammatory response syndrome) (HCC)   Atrial fibrillation with rapid ventricular response ([place])   Diarrhea   Length of Stay: 2  SUBJECTIVE  Sleepy, no complaints.  CURRENT MEDS . amiodarone  200 mg Oral BID  . cholestyramine light  4 g Oral Daily  . folic acid  1 mg Oral Daily  . LORazepam  0-4 mg Intravenous Q6H   Followed by  . LORazepam  0-4 mg Intravenous Q12H  . metoprolol tartrate  12.5 mg Oral BID  . multivitamin with minerals  1 tablet Oral Daily  . multivitamin with minerals  1 tablet Oral Daily  . pantoprazole  40 mg Oral Q1200  . simethicone  160 mg Oral QID  . sodium chloride flush  3 mL Intravenous Q12H  . sucralfate  1 g Oral TID WC & HS  . thiamine  100 mg Oral Daily  . traZODone  100 mg Oral QHS    OBJECTIVE   Intake/Output Summary (Last 24 hours) at 04/30/16 1046 Last data filed at 04/30/16 0900  Gross per 24 hour  Intake             1960 ml  Output              500 ml  Net             1460 ml   Filed Weights   04/28/16 2100 04/29/16 0559 04/30/16 0417  Weight: 95.8 kg (211 lb 3.2 oz) 96.8 kg (213 lb 8 oz) 101.7 kg (224 lb 1.6 oz)    PHYSICAL EXAM Vitals:   04/29/16 1741 04/29/16 2112 04/30/16 0417 04/30/16 0745  BP: 122/86 109/83 (!) 82/61 96/80  Pulse: 99 (!) 104 88 89  Resp: 19 17 19 18   Temp: 98 F (36.7 C) 98.1 F (36.7 C) 98.7 F (37.1 C) 98 F (36.7 C)  TempSrc: Oral Oral Oral Oral  SpO2:  98% 97% 100%  Weight:   101.7 kg (224 lb 1.6 oz)   Height:       General: sleepy, no distress Head: no evidence of trauma, PERRL, EOMI, no exophtalmos or lid lag, no myxedema, no xanthelasma; normal ears, nose and  oropharynx Neck: normal jugular venous pulsations and no hepatojugular reflux; brisk carotid pulses without delay and no carotid bruits Chest: clear to auscultation, no signs of consolidation by percussion or palpation, normal fremitus, symmetrical and full respiratory excursions Cardiovascular: normal position and quality of the apical impulse, irregular rhythm, normal first and second heart sounds, no rubs or gallops, no murmur Abdomen: no tenderness or distention, no masses by palpation, no abnormal pulsatility or arterial bruits, normal bowel sounds, no hepatosplenomegaly Extremities: no clubbing, cyanosis or edema; 2+ radial, ulnar and brachial pulses bilaterally; 2+ right femoral, posterior tibial and dorsalis pedis pulses; 2+ left femoral, posterior tibial and dorsalis pedis pulses; no subclavian or femoral bruits Neurological: grossly nonfocal  LABS  CBC  Recent Labs  04/28/16 1536 04/28/16 1552 04/29/16 1248  WBC 5.2  --  4.0  NEUTROABS 3.3  --   --   HGB 14.4 16.0 12.0*  HCT 41.3 47.0 35.1*  MCV 105.4*  --  106.0*  PLT 154  --  99991111*   Basic Metabolic Panel  Recent Labs  04/29/16 0305 04/29/16 1248 04/30/16 0915  NA  --  139 139  K  --  3.1* 3.6  CL  --  116* 112*  CO2  --  18* 21*  GLUCOSE  --  159* 123*  BUN  --  <5* <5*  CREATININE  --  0.83 0.80  CALCIUM  --  7.1* 7.3*  MG 1.6*  --  1.8   Liver Function Tests  Recent Labs  04/28/16 1536 04/30/16 0915  AST 166* 130*  ALT 64* 47  ALKPHOS 100 97  BILITOT 2.7* 1.6*  PROT 6.2* 5.3*  ALBUMIN 2.2* 1.8*    Recent Labs  04/28/16 2040  LIPASE 30   Cardiac Enzymes  Recent Labs  04/28/16 2040 04/29/16 0305 04/29/16 1248  TROPONINI 0.04* 0.05* 0.04*   BNP Radiology Studies Imaging results have been reviewed and Dg Chest Portable 1 View  Result Date: 04/28/2016 CLINICAL DATA:  Midsternal on left-sided chest pain. Tightness and pressure. EXAM: PORTABLE CHEST 1 VIEW COMPARISON:  One-view chest  x-ray 04/18/2016 FINDINGS: Heart size normal. Lungs are clear. The visualized soft tissues and bony thorax are unremarkable. IMPRESSION: Negative one-view chest x-ray Electronically Signed   By: San Morelle M.D.   On: 04/28/2016 15:58  Dg Abd 2 Views  Result Date: 04/29/2016 CLINICAL DATA:  Pt c/o general abdominal pain. Hx of acute gastric ulcer, gastritis with hemorrhage, cirrhosis, gallstones, AND GERD. EXAM: ABDOMEN - 2 VIEW COMPARISON:  CT, 04/18/2016 FINDINGS: Normal bowel gas pattern.  No free air. No convincing renal or ureteral stones. IMPRESSION: 1. No free air. No evidence of bowel obstruction or generalized adynamic ileus. No acute findings. Electronically Signed   By: Lajean Manes M.D.   On: 04/29/2016 15:20   TELE Afib, ventricular rates 70s-90s   ASSESSMENT AND PLAN No cardiac symptoms. K is now in normal range. Good heart rate control. BP relatively low. Continue amiodarone and reduce metoprolol dose. He may yet convert to normal rhythm. Not a candidate for anticoagulation.   Sanda Klein, MD, [facility] [phone] office [phone] pager 04/30/2016 10:46 AM

## 2016-04-30 NOTE — Care Management Obs Status (Signed)
[place] NOTIFICATION   Patient Details  Name: YOEL GILCHREST MRN: [id number] Date of Birth: [birth year]   Medicare Observation Status Notification Given:  Yes    Bethena Roys, RN 04/30/2016, 11:06 AM

## 2016-04-30 NOTE — Progress Notes (Signed)
PT Cancellation Note  Patient Details Name: Nathan Frank MRN: [id number] DOB: [birth year]   Cancelled Treatment:    Reason Eval/Treat Not Completed: Fatigue/lethargy limiting ability to participate. Pt would not stay awake for RN or PT. Pt not alert enough to participate with therapy. Will check back another day.    Weston Anna, MPT Pager: [phone]

## 2016-04-30 NOTE — Progress Notes (Signed)
Patient Name: Nathan Frank Date of Encounter: 04/30/2016  Principal Problem:   Chest pain Active Problems:   Alcohol abuse   Hepatitis C   Cirrhosis (HCC)   Abdominal pain   Upper GI bleeding   Essential hypertension   Atrial fibrillation with RVR (HCC)   Hypotension   Elevated lactic acid level   Chronic atrial fibrillation (HCC)   SIRS (systemic inflammatory response syndrome) (HCC)   Atrial fibrillation with rapid ventricular response ([place])   Diarrhea   Primary Cardiologist: Dr Harrington Challenger, Dr Lovena Le Patient Profile: 75 y.o. year old male with a history of atrial fibrillation not on anticoagulation due to hx of GI bleeding, RBBB, SVT, liver cirrhosis, HCV, alcohol abuse, heroin abuse, marijuana abuse, DVT, gastric ulcer, medication noncompliance. D/c 07/19 on amio for atrial arrhythmia (did not take). Admitted 07/25 w/ diarrhea, abd pain and rapid afib.  SUBJECTIVE: Still w/ diarrhea (food goes right through me), able to eat, not aware of irregular HR, denies chest pain or SOB  OBJECTIVE Vitals:   04/29/16 1741 04/29/16 2112 04/30/16 0417 04/30/16 0745  BP: 122/86 109/83 (!) 82/61 96/80  Pulse: 99 (!) 104 88 89  Resp: 19 17 19 18   Temp: 98 F (36.7 C) 98.1 F (36.7 C) 98.7 F (37.1 C) 98 F (36.7 C)  TempSrc: Oral Oral Oral Oral  SpO2:  98% 97% 100%  Weight:   224 lb 1.6 oz (101.7 kg)   Height:        Intake/Output Summary (Last 24 hours) at 04/30/16 0912 Last data filed at 04/30/16 0422  Gross per 24 hour  Intake             1720 ml  Output              500 ml  Net             1220 ml   Filed Weights   04/28/16 2100 04/29/16 0559 04/30/16 0417  Weight: 211 lb 3.2 oz (95.8 kg) 213 lb 8 oz (96.8 kg) 224 lb 1.6 oz (101.7 kg)    PHYSICAL EXAM General: Well developed, well nourished, male in no acute distress. Head: Normocephalic, atraumatic.  Neck: Supple without bruits, JVD not elevated. Lungs:  Resp regular and unlabored, CTA. Heart: Irreg R&R, S1, S2,  no S3, S4, or murmur; no rub. Abdomen: Soft, tender, non-distended, BS + x 4.  Extremities: No clubbing, cyanosis, 1+ edema.  Neuro: Alert and oriented X 3. Moves all extremities spontaneously. Psych: Normal affect.  LABS: CBC: Recent Labs  04/28/16 1536 04/28/16 1552 04/29/16 1248  WBC 5.2  --  4.0  NEUTROABS 3.3  --   --   HGB 14.4 16.0 12.0*  HCT 41.3 47.0 35.1*  MCV 105.4*  --  106.0*  PLT 154  --  101*   INR: Recent Labs  04/28/16 1536  INR [id number]*   Basic Metabolic Panel: Recent Labs  04/28/16 1536 04/28/16 1552 04/29/16 0305 04/29/16 1248  NA 140 145  --  139  K 2.9* 2.9*  --  3.1*  CL 107 103  --  116*  CO2 24  --   --  18*  GLUCOSE 110* 106*  --  159*  BUN <5* <3*  --  <5*  CREATININE 1.09 0.90  --  0.83  CALCIUM 7.9*  --   --  7.1*  MG  --   --  1.6*  --    Liver Function Tests: Recent Labs  04/28/16 1536  AST 166*  ALT 64*  ALKPHOS 100  BILITOT 2.7*  PROT 6.2*  ALBUMIN 2.2*   Cardiac Enzymes: Recent Labs  04/28/16 2040 04/29/16 0305 04/29/16 1248  TROPONINI 0.04* 0.05* 0.04*    Recent Labs  04/28/16 1550  TROPIPOC 0.03   BNP:  B Natriuretic Peptide  Date/Time Value Ref Range Status  04/29/2016 03:05 AM 413.5 (H) 0.0 - 100.0 pg/mL Final  04/18/2016 06:55 PM 375.8 (H) 0.0 - 100.0 pg/mL Final   TELE:   Atrial fib, RVR at times, occ PVCs     Radiology/Studies: Dg Chest Portable 1 View Result Date: 04/28/2016 CLINICAL DATA:  Midsternal on left-sided chest pain. Tightness and pressure. EXAM: PORTABLE CHEST 1 VIEW COMPARISON:  One-view chest x-ray 04/18/2016 FINDINGS: Heart size normal. Lungs are clear. The visualized soft tissues and bony thorax are unremarkable. IMPRESSION: Negative one-view chest x-ray Electronically Signed   By: San Morelle M.D.   On: 04/28/2016 15:58  Dg Abd 2 Views Result Date: 04/29/2016 CLINICAL DATA:  Pt c/o general abdominal pain. Hx of acute gastric ulcer, gastritis with hemorrhage, cirrhosis,  gallstones, AND GERD. EXAM: ABDOMEN - 2 VIEW COMPARISON:  CT, 04/18/2016 FINDINGS: Normal bowel gas pattern.  No free air. No convincing renal or ureteral stones. IMPRESSION: 1. No free air. No evidence of bowel obstruction or generalized adynamic ileus. No acute findings. Electronically Signed   By: Lajean Manes M.D.   On: 04/29/2016 15:20    Current Medications:  . amiodarone  200 mg Oral BID  . cholestyramine light  4 g Oral Daily  . folic acid  1 mg Oral Daily  . LORazepam  0-4 mg Intravenous Q6H   Followed by  . LORazepam  0-4 mg Intravenous Q12H  . metoprolol tartrate  25 mg Oral BID  . multivitamin with minerals  1 tablet Oral Daily  . multivitamin with minerals  1 tablet Oral Daily  . pantoprazole  40 mg Oral Q1200  . simethicone  160 mg Oral QID  . sodium chloride flush  3 mL Intravenous Q12H  . sucralfate  1 g Oral TID WC & HS  . thiamine  100 mg Oral Daily  . traZODone  100 mg Oral QHS   . dextrose 5 % and 0.45% NaCl Stopped (04/30/16 0738)    ASSESSMENT AND PLAN: Principal Problem:   Chest pain - atypical and ez not consistent w/ ACS - no further workup    Chronic atrial fibrillation (HCC)   Atrial fibrillation with rapid ventricular response (HCC) - rate a little better on BB but BP is borderline - amio restarted, will have to watch LFTs but no other options for rhythm/rate control   Otherwise, per IM, Dr Grandville Silos to f/u on today's labs to decide on K+ supplement and management of his issues. Active Problems:   Alcohol abuse   Hepatitis C   Cirrhosis (HCC)   Abdominal pain   Upper GI bleeding   Essential hypertension   Hypotension   Elevated lactic acid level   SIRS (systemic inflammatory response syndrome) (HCC)    Diarrhea   Signed, Rosaria Ferries , PA-C 9:12 AM 04/30/2016  Agree. See also my note. Will reduce metoprolol dose. Sanda Klein, MD, [facility] [phone] office [phone] pager

## 2016-04-30 NOTE — Care Management Note (Addendum)
Case Management Note  Patient Details  Name: Nathan Frank MRN: [id number] Date of Birth: 05/19/1941  Subjective/Objective: Pt in for Chest pain with n/v abdominal pain. Pt is from home with wife.                     Action/Plan: CM did speak with pt in regards to disposition needs. Pt states he will not need any HH Services once stable for d/c. CM did suggest HHRN for Medication and  Disease management-pt refusing. Pt states he uses dme: cane at home occasionally. No further needs from CM at this time.    Expected Discharge Date:                  Expected Discharge Plan:     In-House Referral:  NA  Discharge planning Services  CM Consult  Post Acute Care Choice: Home with [place] Choice offered to: Patient  DME Arranged:  N/A DME Agency:  NA  HH Arranged:  RN,PT, OT HH Agency: [facility] Status of Service:  Completed, signed off  If discussed at [place] of Stay Meetings, dates discussed:    Additional Comments: 1556 05-01-16 Jacqlyn Krauss, RN,BSN [phone] CM did discuss with pt in reference to disposition needs. Pt is agreeable to [facility] if no co pay. CM did make referral with [facility] for Services. SOC to begin within 24-48 hours post d/c. No further needs from CM at this time.  Bethena Roys, RN 04/30/2016, 11:10 AM

## 2016-04-30 NOTE — Care Management CC44 (Signed)
Condition Code 44 Documentation Completed  Patient Details  Name: Nathan Frank MRN: [id number] Date of Birth: [birth year]   Condition Code 44 given:  Yes Patient signature on Condition Code 44 notice:  Yes Documentation of 2 MD's agreement:  Yes Code 44 added to claim:  Yes    Bethena Roys, RN 04/30/2016, 11:07 AM

## 2016-05-01 DIAGNOSIS — R1030 Lower abdominal pain, unspecified: Secondary | ICD-10-CM | POA: Diagnosis not present

## 2016-05-01 DIAGNOSIS — I4891 Unspecified atrial fibrillation: Secondary | ICD-10-CM | POA: Diagnosis not present

## 2016-05-01 DIAGNOSIS — I10 Essential (primary) hypertension: Secondary | ICD-10-CM | POA: Diagnosis not present

## 2016-05-01 DIAGNOSIS — R079 Chest pain, unspecified: Secondary | ICD-10-CM | POA: Diagnosis not present

## 2016-05-01 DIAGNOSIS — F1010 Alcohol abuse, uncomplicated: Secondary | ICD-10-CM | POA: Diagnosis not present

## 2016-05-01 LAB — COMPREHENSIVE METABOLIC PANEL
ALT: 51 U/L (ref 17–63)
ANION GAP: 5 (ref 5–15)
AST: 127 U/L — ABNORMAL HIGH (ref 15–41)
Albumin: 2.2 g/dL — ABNORMAL LOW (ref 3.5–5.0)
Alkaline Phosphatase: 117 U/L (ref 38–126)
BUN: <5 mg/dL — ABNORMAL LOW (ref 6–20)
CHLORIDE: 113 mmol/L — AB (ref 101–111)
CO2: 21 mmol/L — AB (ref 22–32)
Calcium: 8.2 mg/dL — ABNORMAL LOW (ref 8.9–10.3)
Creatinine, Ser: 0.96 mg/dL (ref 0.61–1.24)
Glucose, Bld: 111 mg/dL — ABNORMAL HIGH (ref 65–99)
POTASSIUM: 3.7 mmol/L (ref 3.5–5.1)
SODIUM: 139 mmol/L (ref 135–145)
Total Bilirubin: 1.7 mg/dL — ABNORMAL HIGH (ref 0.3–1.2)
Total Protein: 6.7 g/dL (ref 6.5–8.1)

## 2016-05-01 LAB — CBC
HCT: 42.9 % (ref 39.0–52.0)
Hemoglobin: 14.6 g/dL (ref 13.0–17.0)
MCH: 36.7 pg — AB (ref 26.0–34.0)
MCHC: 34 g/dL (ref 30.0–36.0)
MCV: 107.8 fL — AB (ref 78.0–100.0)
PLATELETS: 135 10*3/uL — AB (ref 150–400)
RBC: 3.98 MIL/uL — AB (ref 4.22–5.81)
RDW: 17.6 % — AB (ref 11.5–15.5)
WBC: 6.1 10*3/uL (ref 4.0–10.5)

## 2016-05-01 LAB — MAGNESIUM: MAGNESIUM: 1.9 mg/dL (ref 1.7–2.4)

## 2016-05-01 MED ORDER — SPIRONOLACTONE 25 MG PO TABS: 12.5000 mg | ORAL_TABLET | Freq: Every day | ORAL | 11 refills | Status: DC

## 2016-05-01 MED ORDER — FUROSEMIDE 40 MG PO TABS: 20.0000 mg | ORAL_TABLET | Freq: Every day | ORAL | 0 refills | Status: DC

## 2016-05-01 MED ORDER — SIMETHICONE 80 MG PO CHEW: 160.0000 mg | CHEWABLE_TABLET | Freq: Four times a day (QID) | ORAL | 0 refills | Status: DC | PRN

## 2016-05-01 MED ORDER — METOPROLOL TARTRATE 25 MG PO TABS: 12.5000 mg | ORAL_TABLET | Freq: Two times a day (BID) | ORAL | 0 refills | Status: DC

## 2016-05-01 NOTE — Progress Notes (Signed)
Patient Name: Nathan Frank Date of Encounter: 05/01/2016  Principal Problem:   Chest pain Active Problems:   Alcohol abuse   Hepatitis C   Cirrhosis (HCC)   Abdominal pain   Upper GI bleeding   Essential hypertension   Hypotension   Elevated lactic acid level   Chronic atrial fibrillation (HCC)   SIRS (systemic inflammatory response syndrome) (HCC)   Atrial fibrillation with rapid ventricular response ([place])   Diarrhea   Primary Cardiologist: Dr Harrington Challenger, Dr Lovena Le  Patient Profile: 75 y.o.year old malewith a history of atrial fibrillation not on anticoagulation due to hx of GI bleeding, RBBB, SVT,liver cirrhosis, HCV, alcohol abuse, heroin abuse, marijuana abuse,DVT, gastric ulcer, medication noncompliance. D/c 07/19 on amio for atrial arrhythmia (did not take). Admitted 07/25 w/ diarrhea, abd pain and rapid afib, back on amio   SUBJECTIVE: No chest pain, no SOB, c/o constipation  OBJECTIVE Vitals:   04/30/16 2315 05/01/16 0000 05/01/16 0408 05/01/16 0752  BP: 99/76  108/86 109/83  Pulse: (!) 110  (!) 113 (!) 113  Resp: 16  15 16   Temp:  98 F (36.7 C) 98 F (36.7 C)   TempSrc:  Oral Oral   SpO2: 96%  99% 96%  Weight:   229 lb 9.6 oz (104.1 kg)   Height:        Intake/Output Summary (Last 24 hours) at 05/01/16 0809 Last data filed at 05/01/16 0500  Gross per 24 hour  Intake              960 ml  Output             1100 ml  Net             -140 ml   Filed Weights   04/29/16 0559 04/30/16 0417 05/01/16 0408  Weight: 213 lb 8 oz (96.8 kg) 224 lb 1.6 oz (101.7 kg) 229 lb 9.6 oz (104.1 kg)    PHYSICAL EXAM General: Well developed, well nourished, male in no acute distress. Head: Normocephalic, atraumatic.  Neck: Supple without bruits, JVD not elevated. Lungs:  Resp regular and unlabored, CTA. Heart: Irreg R*R, S1, S2, no S3, S4, or murmur; no rub. Abdomen: Soft, non-tender, non-distended, BS + x 4.  Extremities: No clubbing, cyanosis, 1+ edema.    Neuro: Alert and oriented X 3. Moves all extremities spontaneously.  LABS: CBC: Recent Labs  04/28/16 1536  04/29/16 1248 04/30/16 1101  WBC 5.2  --  4.0 4.0  NEUTROABS 3.3  --   --   --   HGB 14.4  < > 12.0* 12.2*  HCT 41.3  < > 35.1* 35.1*  MCV 105.4*  --  106.0* 105.7*  PLT 154  --  101* 118*  < > = values in this interval not displayed. INR: Recent Labs  04/28/16 1536  INR [id number]*   Basic Metabolic Panel: Recent Labs  04/29/16 0305 04/29/16 1248 04/30/16 0915  NA  --  139 139  K  --  3.1* 3.6  CL  --  116* 112*  CO2  --  18* 21*  GLUCOSE  --  159* 123*  BUN  --  <5* <5*  CREATININE  --  0.83 0.80  CALCIUM  --  7.1* 7.3*  MG 1.6*  --  1.8   Liver Function Tests: Recent Labs  04/28/16 1536 04/30/16 0915  AST 166* 130*  ALT 64* 47  ALKPHOS 100 97  BILITOT 2.7* 1.6*  PROT 6.2* 5.3*  ALBUMIN 2.2* 1.8*   Cardiac Enzymes: Recent Labs  04/28/16 2040 04/29/16 0305 04/29/16 1248  TROPONINI 0.04* 0.05* 0.04*    Recent Labs  04/28/16 1550  TROPIPOC 0.03   BNP:  B Natriuretic Peptide  Date/Time Value Ref Range Status  04/29/2016 03:05 AM 413.5 (H) 0.0 - 100.0 pg/mL Final  04/18/2016 06:55 PM 375.8 (H) 0.0 - 100.0 pg/mL Final   TELE:   Atrial fib, rate higher after BB decreased, but BP better    Radiology/Studies: Dg Abd 2 Views  Result Date: 04/29/2016 CLINICAL DATA:  Pt c/o general abdominal pain. Hx of acute gastric ulcer, gastritis with hemorrhage, cirrhosis, gallstones, AND GERD. EXAM: ABDOMEN - 2 VIEW COMPARISON:  CT, 04/18/2016 FINDINGS: Normal bowel gas pattern.  No free air. No convincing renal or ureteral stones. IMPRESSION: 1. No free air. No evidence of bowel obstruction or generalized adynamic ileus. No acute findings. Electronically Signed   By: Lajean Manes M.D.   On: 04/29/2016 15:20    Current Medications:  . amiodarone  200 mg Oral BID  . cholestyramine light  4 g Oral Daily  . folic acid  1 mg Oral Daily  . LORazepam  0-4 mg  Intravenous Q12H  . metoprolol tartrate  12.5 mg Oral BID  . multivitamin with minerals  1 tablet Oral Daily  . multivitamin with minerals  1 tablet Oral Daily  . pantoprazole  40 mg Oral Q1200  . simethicone  160 mg Oral QID  . sodium chloride flush  3 mL Intravenous Q12H  . sucralfate  1 g Oral TID WC & HS  . thiamine  100 mg Oral Daily  . traZODone  100 mg Oral QHS      ASSESSMENT AND PLAN:     Chronic atrial fibrillation (HCC)     Atrial fibrillation with rapid ventricular response (HCC) Rate higher after BB decreased, but BP better - no sx No cardiac symptoms, no further eval for chest pain. K is now in normal range. Good heart rate control. BP relatively low with SBP 80s-100s Continue amiodarone, metoprolol dose decreased to 12.5 mg bid 07/27. He may yet convert to normal rhythm. Not a candidate for anticoagulation. - Cardiology will see over weekend prn  Otherwise, per IM Principal Problem:   Chest pain Active Problems:   Alcohol abuse   Hepatitis C   Cirrhosis (HCC)   Abdominal pain   Upper GI bleeding   Essential hypertension   Hypotension   Elevated lactic acid level   Chronic atrial fibrillation (HCC)   SIRS (systemic inflammatory response syndrome) (HCC)   Atrial fibrillation with rapid ventricular response ([place])   Diarrhea   Signed, Barrett, Rhonda , PA-C 8:09 AM 05/01/2016   I have seen and examined the patient along with Barrett, Rhonda , PA-C.  I have reviewed the chart, notes and new data.  I agree with PA/NP's note.  Periods of faster heart rate appear to be regular atrial tachycardia or atrial flutter with 2:1 AV block.   PLAN: Continue amiodarone. As this slowly "kicks in" expect he may convert to normal rhythm or at least have slower rates. Higher doses of bet blocker were associated with hypotension.  Sanda Klein, MD, [place] [phone] 05/01/2016, 9:32 AM

## 2016-05-01 NOTE — Progress Notes (Signed)
Occupational Therapy Treatment Patient Details Name: Nathan Frank MRN: [id number] DOB: [birth year] Today's Date: 05/01/2016    History of present illness 75 y.o. male admitted for intermittent abdominal pain, dark tarry stool, and chest pain. Pt dx with A-fib with RVR rates in the 120s, N/V/D and abdominal pain GI pannel pending, on c-diff precautions, elevated lactic acid level, hypotension, and hypokalemia/hypomgnesemia.  PMH significant for HTN, MI, anemia, hepatitis C, poly substance abuse, neuropathy, upper GI bleeding, GERD, and hx of DVT.    OT comments  Pt making gradual progress toward OT goals this session. Required min assist for sit to stand today with min guard assist for functional mobility. Pt able to complete grooming with min guard assist while standing at the sink. Per RN, pt required +2 assist this AM for stand pivot transfer to [facility]; therefore updated follow up recommendations to home with HHOT to maximize independence and safety with ADL and functional mobility upon return home. Will continue to follow acutely.   Follow Up Recommendations  Home health OT;Supervision/Assistance - 24 hour    Equipment Recommendations  None recommended by OT    Recommendations for Other Services      Precautions / Restrictions Precautions Precautions: Fall Restrictions Weight Bearing Restrictions: No       Mobility Bed Mobility Overal bed mobility: Needs Assistance Bed Mobility: Supine to Sit;Sit to Supine     Supine to sit: Min guard;HOB elevated Sit to supine: Min guard;HOB elevated   General bed mobility comments: Increased time required. HOB elevated with use of bed rails.  Transfers Overall transfer level: Needs assistance Equipment used: Rolling walker (2 wheeled) Transfers: Sit to/from Stand Sit to Stand: Min assist         General transfer comment: Min assist to boost up from EOB. Min guard provided during functional mobility.    Balance Overall balance  assessment: Needs assistance Sitting-balance support: Feet supported;No upper extremity supported Sitting balance-Leahy Scale: Good     Standing balance support: No upper extremity supported;During functional activity Standing balance-Leahy Scale: Fair Standing balance comment: Pt able to stand at sink and complete grooming activity without UE support.                    ADL Overall ADL's : Needs assistance/impaired     Grooming: Min guard;Standing;Oral care                   Toilet Transfer: Minimal assistance;Ambulation;BSC;RW Toilet Transfer Details (indicate cue type and reason): Simulated by sit to stand from EOB with functional mobility in room.         Functional mobility during ADLs: Min guard;Rolling walker General ADL Comments: Pt reports he feels weak but also feels he is safe to return home at this time. Educated pt on home safety and fall prevention strategies; pt verbalized understanding.      Vision                     Perception     Praxis      Cognition   Behavior During Therapy: Flat affect Overall Cognitive Status: Within Functional Limits for tasks assessed                       Extremity/Trunk Assessment               Exercises     Shoulder Instructions       General Comments  Pertinent Vitals/ Pain       Pain Assessment: 0-10 Pain Score: 2  Pain Location: stomach Pain Descriptors / Indicators: Sore Pain Intervention(s): Monitored during session  Home Living                                          Prior Functioning/Environment              Frequency Min 2X/week     Progress Toward Goals  OT Goals(current goals can now be found in the care plan section)  Progress towards OT goals: Progressing toward goals  Acute Rehab OT Goals Patient Stated Goal: home today OT Goal Formulation: With patient  Plan Discharge plan needs to be updated    Co-evaluation                  End of Session Equipment Utilized During Treatment: Gait belt;Rolling walker   Activity Tolerance Patient tolerated treatment well   Patient Left in bed;with call bell/phone within reach;with bed alarm set   Nurse Communication Mobility status        Time: 1111-1130 OT Time Calculation (min): 19 min  Charges: OT General Charges $OT Visit: 1 Procedure OT Treatments $Self Care/Home Management : 8-22 mins  Binnie Kand M.S., OTR/L Pager: [phone]  05/01/2016, 11:41 AM

## 2016-05-01 NOTE — Discharge Instructions (Signed)

## 2016-05-01 NOTE — Discharge Summary (Signed)
Physician Discharge Summary  Nathan Frank [id number] DOB: 20-Jan-1941 DOA: 04/28/2016  PCP: Thressa Sheller, MD  Admit date: 04/28/2016 Discharge date: 05/01/2016  Time spent: 65 minutes  Recommendations for Outpatient Follow-up:  1. Follow-up with Thressa Sheller, MD in 1-2 weeks. On follow-up patient will need a basic metabolic profile done to follow-up on electrolytes and renal function. Patient's Lasix and spironolactone dose was decreased to half his home dose secondary to borderline blood pressure and a such as blood pressure will need to be assessed. 2. Follow-up with Dr. Harrington Challenger of cardiology in 2 weeks for follow-up on A. fib as patient recently started on amiodarone.   Discharge Diagnoses:  Principal Problem:   Chest pain Active Problems:   Alcohol abuse   Hepatitis C   Cirrhosis (HCC)   Abdominal pain   Upper GI bleeding   Essential hypertension   Hypotension   Elevated lactic acid level   Chronic atrial fibrillation (HCC)   SIRS (systemic inflammatory response syndrome) (HCC)   Atrial fibrillation with rapid ventricular response (HCC)   Diarrhea   Discharge Condition: Stable and improved  Diet recommendation: Heart healthy  Filed Weights   04/29/16 0559 04/30/16 0417 05/01/16 0408  Weight: 96.8 kg (213 lb 8 oz) 101.7 kg (224 lb 1.6 oz) 104.1 kg (229 lb 9.6 oz)    History of present illness:  Per Dr Wyonia Hough is a 75 y.o. male with medical history significant of atrial fibrillation not on anticoagulation due to hx of GI bleeding, liver cirrhosis, HCV, alcohol abuse, heroin abuse, DVT, gastric ulcer, GI bleeding, CAD, medication noncompliance, who presented with diarrhea, abdominal pain, chest pain.  Patient reported that he had been having intermittent abdominal pain for more than one week. It was located in the lower abdomen, worse on the right lower quadrant, 8 out of 10 in severity, nonradiating. Patient stated that he had diarrhea in the past 2  days. He had 5-6 watery bowel movement each day, with black colored stool. He denied nausea, vomiting, fever or chills. He also reported chest pain which has been going on intermittently for 3 days. His chest pain is located in the left chest, 6 out of 10 in severity, nonradiating, non-pleuritic. He did not have shortness of breath, cough. No tenderness over calf areas. He states that he has dysuria and burning on urination sometimes. No rashes or unilateral weakness. Pt was initially hypotensive with Bp 89/67 which improved to 103/83 after 2 L normal saline bolus in ED. He was in A fib with RVR with HR 123 in ED.  ED Course: pt was found to have hemoglobin 14.4, negative FOBT, WBC 5.2, temperature normal, oxygen saturation 97% on room air, potassium 2.9, creatinine 1.09, negative chest x-ray, lactate is 3.85, INR 1.65, troponin 0.05. Pending UA. Patient was admitted to stepdown bed as inpt for further eval and treatment.  Hospital Course:  #1 chest pain Patient presented with chest pain noted to have A. fib with RVR with heart rates of 123 on admission and slightly elevated troponin of 0.05. Patient was also noted to be hypotensive with blood pressure of 89/67 which responded to IV fluids. Patient's chest pain had resolved by day of discharge. Cardiology consulted.  Cardiac enzymes minimally elevated. 2-D echo from 04/20/2016 with a EF of 55-60% with no wall motion abnormalities grade 2 diastolic dysfunction. Patient noted to have a previous non-STEMI 2016 were troponins were as high as 7.72. Patient with normal coronaries. Patient has been seen in consultation by  cardiology who recommend resumption of amiodarone and replacement of electrolytes with no further cardiac workup needed at this time. Follow.  #2 nausea, vomiting, diarrhea/abdominal pain Questionable etiology. Likely secondary to gastroenteritis. Patient underwent recent workup during last hospitalization. Patient complaining of black stools  however FOBT is negative. Patient with prior history of cirrhosis. Patient with some lower abdominal pain to palpation which improved and had resolved by day of discharge.. Urinalysis small leukocytes nitrite -0-5 WBCs. KUB unremarkable. Repleted electrolytes. GI pathogen panel negative. Patient was placed on scheduled simethicone. Patient was also given a dose of cholestyramine. Patient was tolerating a regular diet with no further nausea or emesis or diarrhea. Outpatient follow-up.   #3 A. fib with RVR CHA2DS2VASC 4 Heart rate in the 123's on admission which improved to 110 with IV fluids. Recent TSH within normal limits as well as free T4. Amiodarone was been resumed per cardiology. Metoprolol dose has been decreased per cardiology for improved blood pressure. Patient was noted to have a fast heart rate which appeared to be regular HR tachycardia or a flutter per cardiology. It was felt to continue patient's amiodarone and as his amiodarone slowly " kicks in" patient may convert to normal sinus rhythm always improves heart rates. Outpatient follow-up with patient's primary cardiologist, Dr. Harrington Challenger.  #4 elevated lactic acid level Differential includes systemic inflammatory response versus sepsis versus hypoperfusion secondary to hypotension and starvation due to alcohol abuse. Improved.  #5 hypotension Likely secondary to hypovolemic hypovolemia secondary to diarrhea and severe tachycardia. Improved with hydration. Beta blocker dose has been decreased per cardiology for better perfusion and blood pressure. Patient's hypotension had resolved by day of discharge. Outpatient follow-up.   #6 hypokalemia/hypomagnesemia Likely secondary to GI losses and chronic alcohol use. Patient's potassium was repleted and currently at 3.7. Magnesium at 1.9.   #7 history of gastric ulcer and upper GI bleed Patient complaining of black stools however FOBT was negative. H&H remained stable. Patient was maintained on  a PPI. Outpatient follow-up.   #8 hepatitis C and cirrhosis Stable. No signs of hepatic encephalopathy. Lasix and spironolactone were held secondary to borderline blood pressure. Patient remained stable. Patient be discharged home on half home dose of Lasix and spironolactone. Outpatient follow-up.  #9 alcohol abuse  Ativan CIWA protocol was placed. Patient had no signs or symptoms of withdrawal during the hospitalization.    Procedures:  Chest x-ray 04/28/2016  Abdominal x-ray 04/29/2016  Consultations:  Cardiology: Dr. Sallyanne Kuster 04/29/2016   Discharge Exam: Vitals:   05/01/16 1150 05/01/16 1208  BP:  97/81  Pulse:  (!) 115  Resp:  16  Temp: 97.9 F (36.6 C)     General: NAD Cardiovascular: RRR Respiratory: CTAB Abdomen: Soft, nontender, nondistended, positive bowel sounds.  Discharge Instructions   Discharge Instructions    Diet - low sodium heart healthy    Complete by:  As directed   Discharge instructions    Complete by:  As directed   Follow up with Thressa Sheller, MD in 1-2 weeks. Follow up Dr Harrington Challenger, in 2 weeks.   Increase activity slowly    Complete by:  As directed     Current Discharge Medication List    START taking these medications   Details  simethicone (MYLICON) 80 MG chewable tablet Chew 2 tablets (160 mg total) by mouth 4 (four) times daily as needed for flatulence. Qty: 30 tablet, Refills: 0      CONTINUE these medications which have CHANGED   Details  furosemide (LASIX) 40  MG tablet Take 0.5 tablets (20 mg total) by mouth daily. Qty: 30 tablet, Refills: 0    metoprolol tartrate (LOPRESSOR) 25 MG tablet Take 0.5 tablets (12.5 mg total) by mouth 2 (two) times daily. Qty: 60 tablet, Refills: 0    spironolactone (ALDACTONE) 25 MG tablet Take 0.5 tablets (12.5 mg total) by mouth daily. Refills: 11      CONTINUE these medications which have NOT CHANGED   Details  amiodarone (PACERONE) 200 MG tablet Take 1 tablet (200 mg total) by  mouth 2 (two) times daily. Qty: 60 tablet, Refills: 1    folic acid (FOLVITE) 1 MG tablet Take 1 tablet (1 mg total) by mouth daily. Qty: 30 tablet, Refills: 0    Multiple Vitamin (MULTIVITAMIN WITH MINERALS) TABS tablet Take 1 tablet by mouth daily. Qty: 30 tablet, Refills: 0    omeprazole (PRILOSEC) 40 MG capsule Take 1 capsule (40 mg total) by mouth daily. For acid reflux Qty: 60 capsule, Refills: 2    sucralfate (CARAFATE) 1 g tablet Take 1 tablet (1 g total) by mouth 4 (four) times daily -  with meals and at bedtime. Qty: 30 tablet, Refills: 0    thiamine 100 MG tablet Take 1 tablet (100 mg total) by mouth daily. Qty: 30 tablet, Refills: 0    traMADol (ULTRAM) 50 MG tablet Take 50 mg by mouth 3 (three) times daily. Refills: 0    traZODone (DESYREL) 100 MG tablet Take 100 mg by mouth at bedtime. Refills: 1       No Known Allergies Follow-up Information    MACKENZIE,BRIAN, MD. Schedule an appointment as soon as possible for a visit in 1 week(s).   Specialty:  Internal Medicine Why:  f/u in 1-2 weeks. Contact information: [place], [place] [address] [phone]        [name], MD. Schedule an appointment as soon as possible for a visit in 2 week(s).   Specialty:  Cardiology Contact information: [place] [address] [phone]            The results of significant diagnostics from this hospitalization (including imaging, microbiology, ancillary and laboratory) are listed below for reference.    Significant Diagnostic Studies: Dg Chest Portable 1 View  Result Date: 04/28/2016 CLINICAL DATA:  Midsternal on left-sided chest pain. Tightness and pressure. EXAM: PORTABLE CHEST 1 VIEW COMPARISON:  One-view chest x-ray 04/18/2016 FINDINGS: Heart size normal. Lungs are clear. The visualized soft tissues and bony thorax are unremarkable. IMPRESSION: Negative one-view chest x-ray Electronically Signed   By:  San Morelle M.D.   On: 04/28/2016 15:58  Dg Chest Port 1 View  Result Date: 04/18/2016 CLINICAL DATA:  75 year old male with chest pain and vomiting for 3 days. EXAM: PORTABLE CHEST 1 VIEW COMPARISON:  12/02/2015 and prior radiographs FINDINGS: Cardiomegaly again noted. There is no evidence of focal airspace disease, pulmonary edema, suspicious pulmonary nodule/mass, pleural effusion, or pneumothorax. No acute bony abnormalities are identified. Remote left rib fractures are noted. IMPRESSION: Cardiomegaly without evidence of acute cardiopulmonary disease. Electronically Signed   By: Margarette Canada M.D.   On: 04/18/2016 19:55   Dg Abd 2 Views  Result Date: 04/29/2016 CLINICAL DATA:  Pt c/o general abdominal pain. Hx of acute gastric ulcer, gastritis with hemorrhage, cirrhosis, gallstones, AND GERD. EXAM: ABDOMEN - 2 VIEW COMPARISON:  CT, 04/18/2016 FINDINGS: Normal bowel gas pattern.  No free air. No convincing renal or ureteral stones. IMPRESSION: 1. No free  air. No evidence of bowel obstruction or generalized adynamic ileus. No acute findings. Electronically Signed   By: Lajean Manes M.D.   On: 04/29/2016 15:20  Ct Angio Abd/pel W/ And/or W/o  Result Date: 04/18/2016 CLINICAL DATA:  75 year old male with upper GI bleed concern for mesenteric ischemia. EXAM: CTA ABDOMEN AND PELVIS wITHOUT AND WITH CONTRAST TECHNIQUE: Multidetector CT imaging of the abdomen and pelvis was performed using the standard protocol during bolus administration of intravenous contrast. Multiplanar reconstructed images and MIPs were obtained and reviewed to evaluate the vascular anatomy. CONTRAST:  100 cc Isovue 370 COMPARISON:  Abdominal CT dated 12/12/2013 and ultrasound dated 12/12/2013 and 01/07/2016. FINDINGS: The visualized lung bases are clear. No intra-abdominal free air or free fluid. There is fatty infiltration of the liver with advanced cirrhosis and fibrotic changes. Focal lesion or focal enhancement identified.  There multiple stones within the gallbladder. No pericholecystic fluid. Ultrasound may provide better evaluation of the gallbladder if clinically indicated. The pancreas, spleen, adrenal glands appear unremarkable. There is no hydronephrosis on either side. The visualized ureters and urinary bladder appear unremarkable. The prostate and seminal vesicles are grossly unremarkable. Evaluation of the bowel is limited in the absence of oral contrast. There is no evidence of bowel obstruction or active inflammation. There is minimal prominence of the tip of the appendix. The appendix is otherwise unremarkable without inflammatory changes. There is mild aortoiliac atherosclerotic disease. There is no aortic aneurysm or evidence of dissection. The origins of the celiac axis, SMA, IMA as well as the origins of the renal arteries appear patent. There is a classic celiac axis branching anatomy. No portal venous gas identified. First the SMV is, splenic vein, and main portal vein are patent. 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 There is no adenopathy. The abdominal wall soft tissues appear unremarkable. There is degenerative changes of the spine. No acute fracture. Review of the MIP images confirms the above findings. IMPRESSION: No CT evidence of bowel ischemia as clinically concerned. Advanced cirrhosis with its type chronic changes of the liver and fatty infiltration. No discrete focal mass identified. 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 Cholelithiasis. No evidence of bowel obstruction or active inflammation. Normal appendix. Electronically Signed   By: Anner Crete M.D.   On: 04/18/2016 20:53   US Abdomen Limited Ruq  Result Date: 04/19/2016 CLINICAL DATA:  Initial evaluation for acute abdominal pain. Gallstones on prior CT. EXAM: US ABDOMEN LIMITED - RIGHT UPPER QUADRANT COMPARISON:  Prior CT from 04/18/2016.  FINDINGS: Gallbladder: Multiple echogenic stones present within the gallbladder lumen. Gallbladder wall measured within normal limits at 2.4 mm. No free pericholecystic fluid. Sonographic Percell Miller sign was elicited on exam. Common bile duct: Diameter: 4.4 mm Liver: Extremely limited evaluation on this exam. Diffuse hepatic steatosis suspected. Known cirrhotic changes present. No focal mass. IMPRESSION: 1. Cholelithiasis with positive sonographic Murphy's sign. No other sonographic features indicative of acute cholecystitis. No biliary dilatation. 2. Cirrhosis with hepatic steatosis, better evaluated on recent CT. Electronically Signed   By: Jeannine Boga M.D.   On: 04/19/2016 00:56    Microbiology: Recent Results (from the past 240 hour(s))  Culture, blood (x 2)     Status: None (Preliminary result)   Collection Time: 04/28/16  8:15 PM  Result Value Ref Range Status   Specimen Description BLOOD LEFT ARM  Final   Special Requests IN PEDIATRIC BOTTLE 3CC  Final   Culture NO GROWTH 2  DAYS  Final   Report Status PENDING  Incomplete  Culture, blood (x 2)     Status: None (Preliminary result)   Collection Time: 04/28/16  8:40 PM  Result Value Ref Range Status   Specimen Description BLOOD RIGHT ARM  Final   Special Requests BOTTLES DRAWN AEROBIC ONLY 5CC  Final   Culture NO GROWTH 2 DAYS  Final   Report Status PENDING  Incomplete  MRSA PCR Screening     Status: None   Collection Time: 04/28/16  9:16 PM  Result Value Ref Range Status   MRSA by PCR NEGATIVE NEGATIVE Final    Comment:        The GeneXpert MRSA Assay (FDA approved for NASAL specimens only), is one component of a comprehensive MRSA colonization surveillance program. It is not intended to diagnose MRSA infection nor to guide or monitor treatment for MRSA infections.   C difficile quick scan w PCR reflex     Status: None   Collection Time: 04/29/16  1:06 AM  Result Value Ref Range Status   C Diff antigen NEGATIVE NEGATIVE  Final   C Diff toxin NEGATIVE NEGATIVE Final   C Diff interpretation No C. difficile detected.  Final  Gastrointestinal Panel by PCR , Stool     Status: None   Collection Time: 04/29/16  1:06 AM  Result Value Ref Range Status   Campylobacter species NOT DETECTED NOT DETECTED Final   Plesimonas shigelloides NOT DETECTED NOT DETECTED Final   Salmonella species NOT DETECTED NOT DETECTED Final   Yersinia enterocolitica NOT DETECTED NOT DETECTED Final   Vibrio species NOT DETECTED NOT DETECTED Final   Vibrio cholerae NOT DETECTED NOT DETECTED Final   Enteroaggregative E coli (EAEC) NOT DETECTED NOT DETECTED Final   Enteropathogenic E coli (EPEC) NOT DETECTED NOT DETECTED Final   Enterotoxigenic E coli (ETEC) NOT DETECTED NOT DETECTED Final   Shiga like toxin producing E coli (STEC) NOT DETECTED NOT DETECTED Final   E. coli O157 NOT DETECTED NOT DETECTED Final   Shigella/Enteroinvasive E coli (EIEC) NOT DETECTED NOT DETECTED Final   Cryptosporidium NOT DETECTED NOT DETECTED Final   Cyclospora cayetanensis NOT DETECTED NOT DETECTED Final   Entamoeba histolytica NOT DETECTED NOT DETECTED Final   Giardia lamblia NOT DETECTED NOT DETECTED Final   Adenovirus F40/41 NOT DETECTED NOT DETECTED Final   Astrovirus NOT DETECTED NOT DETECTED Final   Norovirus GI/GII NOT DETECTED NOT DETECTED Final   Rotavirus A NOT DETECTED NOT DETECTED Final   Sapovirus (I, II, IV, and V) NOT DETECTED NOT DETECTED Final     Labs: Basic Metabolic Panel:  Recent Labs Lab 04/28/16 1536 04/28/16 1552 04/29/16 0305 04/29/16 1248 04/30/16 0915 05/01/16 0827  NA 140 145  --  139 139 139  K 2.9* 2.9*  --  3.1* 3.6 3.7  CL 107 103  --  116* 112* 113*  CO2 24  --   --  18* 21* 21*  GLUCOSE 110* 106*  --  159* 123* 111*  BUN <5* <3*  --  <5* <5* <5*  CREATININE 1.09 0.90  --  0.83 0.80 0.96  CALCIUM 7.9*  --   --  7.1* 7.3* 8.2*  MG  --   --  1.6*  --  1.8 1.9   Liver Function Tests:  Recent Labs Lab  04/28/16 1536 04/30/16 0915 05/01/16 0827  AST 166* 130* 127*  ALT 64* 47 51  ALKPHOS 100 97 117  BILITOT 2.7* 1.6* 1.7*  PROT 6.2* 5.3* 6.7  ALBUMIN 2.2* 1.8* 2.2*    Recent Labs Lab 04/28/16 2040  LIPASE 30    Recent Labs Lab 04/28/16 1536  AMMONIA 75*   CBC:  Recent Labs Lab 04/28/16 1536 04/28/16 1552 04/29/16 1248 04/30/16 1101 05/01/16 0827  WBC 5.2  --  4.0 4.0 6.1  NEUTROABS 3.3  --   --   --   --   HGB 14.4 16.0 12.0* 12.2* 14.6  HCT 41.3 47.0 35.1* 35.1* 42.9  MCV 105.4*  --  106.0* 105.7* 107.8*  PLT 154  --  101* 118* 135*   Cardiac Enzymes:  Recent Labs Lab 04/28/16 1536 04/28/16 2040 04/29/16 0305 04/29/16 1248  TROPONINI 0.05* 0.04* 0.05* 0.04*   BNP: BNP (last 3 results)  Recent Labs  12/13/15 1226 04/18/16 1855 04/29/16 0305  BNP 256.6* 375.8* 413.5*    ProBNP (last 3 results) No results for input(s): PROBNP in the last 8760 hours.  CBG: No results for input(s): GLUCAP in the last 168 hours.     SignedIrine Seal MD.  [facility] 05/01/2016, 3:37 PM

## 2016-05-01 NOTE — Progress Notes (Signed)
I reviewed d/c instructions with patient. His grandson came to pick him up. He stated that Nathan Frank takes care of his own medications at home. We took him to his car via wheelchair in stable condition. No further questions

## 2016-05-01 NOTE — Progress Notes (Signed)
Physical Therapy Treatment Patient Details Name: Nathan Frank MRN: [id number] DOB: 1941/03/04 Today's Date: 05/01/2016    History of Present Illness 75 y.o. male admitted for intermittent abdominal pain, dark tarry stool, and chest pain. Pt dx with A-fib with RVR rates in the 120s, N/V/D and abdominal pain GI pannel pending, on c-diff precautions, elevated lactic acid level, hypotension, and hypokalemia/hypomgnesemia.  PMH significant for HTN, MI, anemia, hepatitis C, poly substance abuse, neuropathy, upper GI bleeding, GERD, and hx of DVT.     PT Comments    Pt is generally weak and deconditioned.  He reluctantly was agreeable to "prove" he could do what he needed to do physically to get into his house.  He needed most assistance on the stairs and to get up from the recliner chair (min assist overall) and definitely needed to use the RW (he would have been unable to use just a cane for gait right now).  He was agreeable to use the walker and to participate in HHPT at discharge.    Follow Up Recommendations  Home health PT;Supervision for mobility/OOB     Equipment Recommendations  None recommended by PT    Recommendations for Other Services   NA     Precautions / Restrictions Precautions Precautions: Fall Precaution Comments: pt is weak and shaky on his feet    Mobility  Bed Mobility Overal bed mobility: Needs Assistance Bed Mobility: Supine to Sit;Sit to Supine     Supine to sit: Supervision;HOB elevated     General bed mobility comments: Supervision for safety as it took significant extra time and effort to get to EOB by himself.  Using bedrail for leverage and [facility] was elevated.   Transfers Overall transfer level: Needs assistance Equipment used: Rolling walker (2 wheeled) Transfers: Sit to/from [facility] Sit to Stand: Min assist;Mod assist Stand pivot transfers: Min assist       General transfer comment: Up to mod assist to get out of low  recliner chair on first attempt.  Pt pulling on RW, once height of bed elevated, min assist to get to standing on second attempt.  Pt continued to do better (even from lower chair) with multiple attempts.   Ambulation/Gait Ambulation/Gait assistance: Supervision Ambulation Distance (Feet): 200 Feet Assistive device: Rolling walker (2 wheeled) Gait Pattern/deviations: Step-through pattern;Shuffle;Trunk flexed Gait velocity: decreased   General Gait Details: Verbal cues for upright posture and closer proximity to RW and to stay inside of RW while turning.    Stairs Stairs: Yes Stairs assistance: Min assist Stair Management: One rail Right;Two rails;Step to pattern;Forwards Number of Stairs: 4 General stair comments: Pt was able to ascend and descend 4 steps showing significant fatigue during attempt with min assist for trunk support and safety.  Pt denied the effort, but his legs were visibly shaking and close to buckling by the end.          Balance Overall balance assessment: Needs assistance Sitting-balance support: Feet supported;No upper extremity supported Sitting balance-Leahy Scale: Good     Standing balance support: Bilateral upper extremity supported;No upper extremity supported;Single extremity supported Standing balance-Leahy Scale: Fair                      Cognition Arousal/Alertness: Awake/alert Behavior During Therapy: WFL for tasks assessed/performed Overall Cognitive Status: Within Functional Limits for tasks assessed (not specifically tested)  Pertinent Vitals/Pain Pain Assessment: No/denies pain           PT Goals (current goals can now be found in the care plan section) Acute Rehab PT Goals Patient Stated Goal: home today Progress towards PT goals: Progressing toward goals    Frequency  Min 3X/week    PT Plan Current plan remains appropriate       End of Session Equipment Utilized During  Treatment: Gait belt Activity Tolerance: Patient limited by fatigue Patient left: in chair;with call bell/phone within reach     Time: 1510-1540 PT Time Calculation (min) (ACUTE ONLY): 30 min  Charges:  $Gait Training: 23-37 mins                      Katori Wirsing B. [place], [place], DPT [phone]   05/01/2016, 5:32 PM

## 2016-05-01 NOTE — Progress Notes (Signed)
Report received in patient's room via [facility] using [employer], reviewed orders, labs, VS, meds and patient's general condition, assumed care of patient.

## 2016-05-01 NOTE — Progress Notes (Signed)
Lab attempted to draw his AM labwork and he refused, charge RN made aware, will let AM nurse know that he will let them draw at [place], no other changes noted at this time.

## 2016-05-02 LAB — TYPE AND SCREEN
ABO/RH(D): O POS
Antibody Screen: NEGATIVE
Unit division: 0
Unit division: 0

## 2016-05-03 LAB — CULTURE, BLOOD (ROUTINE X 2)
CULTURE: NO GROWTH
Culture: NO GROWTH

## 2016-05-05 ENCOUNTER — Ambulatory Visit: Payer: Self-pay | Admitting: Physician Assistant

## 2016-05-05 DIAGNOSIS — R0989 Other specified symptoms and signs involving the circulatory and respiratory systems: Secondary | ICD-10-CM

## 2016-05-05 NOTE — Progress Notes (Deleted)
Cardiology Office Note Date:  05/05/2016  Patient ID:  Nathan, Frank [birth year], MRN [id number] PCP:  Thressa Sheller, MD  Cardiologist:  Dr. Harrington Challenger Electrophysiologist; Dr. Lovena Le  ***refresh   Chief Complaint: Hospital EP f/u, SVT/AT, ?AFib  History of Present Illness: Nathan Frank is a 75 y.o. male with history of atrial fibrillation not on anticoagulation due to hx of GI bleeding, RBBB, SVT, liver cirrhosis, HCV, alcohol abuse, heroin abuse, marijuana abuse, DVT, gastric ulcer, medication noncompliance.   NSTEMI 04/2015 w/ peak troponin 7.72, nl cors at cath with Dr Tamala Julian suggesting "Elevated myocardial markers for injury raises the possibility of coronary spasm, embolus, stress cardiomyopathy (atypical), and aborted infarction related to plaque rupture with thrombosis and dissolution." EF 45-50% w/ diffuse HK not involving the apex and mildly elevated LVEDP.   Admit 07/15-07/19 for hypotension, diarrhea. Had SVT, paroxysmal atrial fib/flutter and atrial tachycardia, started on amiodarone by Dr Lovena Le. F/u arranged 08/01 w/ EP.   Admitted 07/25 with diarrhea, abdominal pain and chest pain noting RAFib, cards asked to see. At that time, no further cardiac or ischemic evaluation was recommended, and did not appear to have started his amiodarone and was noted with RAFib low BP and meds adjusted, hydrated,  this was again rx. Dr. Loletha Grayer noted: PLAN: Not a candidate for anticoagulation. Restart amiodarone as per Dr. Tanna Furry consult note (despite liver toxicity potential, there are no other good rate control or antiarrhythmic options). Replace K. No plans for additional CV workup on this admission.  He was just discharged from this admission 05/01/16, comes in today to e seen for dr. Lovena Le, (from the prior admission) where he noted: episodes of SVT. He does not have palpitations and cannot tell that he is in SVT. The episodes start and stop suddenly. He has not had syncope. He has been  difficult to treat with AV nodal blocking drugs because he has chronically low blood pressure. Review of his tele strips demonstrates that he has both SVT and atrial tachy vs atrial fib. 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 At other times, he has an irregular tachycardia, c/w atrial fib. Recommendations were: 1. 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 Probable atrial fib - he is not a candidate for anti-coagulation.  3. Probable atrial tachycardia - with all of above, I think amiodarone, best option. He is non-compliant. Hopefully he will take the amio and followup in clinic.   *** taking amio?  labs *** see d/c summary f/u planned  Lengthy discussion   Past Medical History:  Diagnosis Date  . Abnormal TSH   . Acute gastric ulcer   . Acute gastritis with hemorrhage   . Alcohol abuse   . Alcohol dependence ([place]) 02/02/2014  . Anemia   . Cirrhosis ([place]) 06/04/2012  . Depressive disorder 02/01/2014  . Dizziness and giddiness 12/13/2014  . DVT of lower limb, acute ([place]) 06/06/2012  . Essential hypertension   . Fatty liver   . Gallstones   . Gastritis 06/07/2012  . GERD (gastroesophageal reflux disease)   . GI bleed due to NSAIDs 12/13/2014  . Granulomatous gastritis   . Hematuria 06/04/2012  . Hepatitis C   . Heroin abuse    "I haven't done that since I don't know when."  . Heroin overdose 02/20/2014  . LV dysfunction    a. 04/2015: EF 45-50% by cath.  . Neuropathy ([place])   . NSTEMI (  non-ST elevated myocardial infarction) ([place])    a. 04/2015 - patent coronaries. Etiology possibly due to coronary spasm versus embolus, stress cardiomyopathy (atypical), and aborted infarction related to plaque rupture with thrombosis and dissolution. Amlodipine started. Not on antiplatelets due to GIB/cirrhosis history.  . Oral thrush 06/05/2012  . Polysubstance abuse    Rare marijuana.  No EtOH x 2 months.   . Prolonged Q-T interval on ECG    a. 12/2014  - treated with magnesium.  . Right knee pain 12/13/2014  . S/P alcohol detoxification 02/02/2014  . SVT (supraventricular tachycardia) ([place])    a. 12/2014 in setting of GIB, ETOH, NSAIDS, gastritis.  . Symptomatic cholelithiasis 12/15/2013  . Thrombocytopenia ([place])   . Upper GI bleeding 12/13/2014  . Weight loss 06/04/2012    Past Surgical History:  Procedure Laterality Date  . CARDIAC CATHETERIZATION N/A 04/15/2015   Procedure: Left Heart Cath and Coronary Angiography;  Surgeon: Belva Crome, MD;  Location: [facility];  Service: Cardiovascular;  Laterality: N/A;  . CIRCUMCISION    . ESOPHAGOGASTRODUODENOSCOPY  06/07/2012   Procedure: ESOPHAGOGASTRODUODENOSCOPY (EGD);  Surgeon: Milus Banister, MD;  Location: [facility];  Service: Endoscopy;  Laterality: N/A;  may need treatment of varices  . ESOPHAGOGASTRODUODENOSCOPY N/A 12/14/2014   Procedure: ESOPHAGOGASTRODUODENOSCOPY (EGD);  Surgeon: Jerene Bears, MD;  Location: [facility] ENDOSCOPY;  Service: Endoscopy;  Laterality: N/A;  . TONSILLECTOMY      Current Outpatient Prescriptions  Medication Sig Dispense Refill  . amiodarone (PACERONE) 200 MG tablet Take 1 tablet (200 mg total) by mouth 2 (two) times daily. 60 tablet 1  . folic acid (FOLVITE) 1 MG tablet Take 1 tablet (1 mg total) by mouth daily. 30 tablet 0  . furosemide (LASIX) 40 MG tablet Take 0.5 tablets (20 mg total) by mouth daily. 30 tablet 0  . metoprolol tartrate (LOPRESSOR) 25 MG tablet Take 0.5 tablets (12.5 mg total) by mouth 2 (two) times daily. 60 tablet 0  . Multiple Vitamin (MULTIVITAMIN WITH MINERALS) TABS tablet Take 1 tablet by mouth daily. 30 tablet 0  . omeprazole (PRILOSEC) 40 MG capsule Take 1 capsule (40 mg total) by mouth daily. For acid reflux 60 capsule 2  . simethicone (MYLICON) 80 MG chewable tablet Chew 2 tablets (160 mg total) by mouth 4 (four) times daily as needed for flatulence. 30 tablet 0  . spironolactone (ALDACTONE) 25 MG tablet Take 0.5 tablets (12.5  mg total) by mouth daily.  11  . sucralfate (CARAFATE) 1 g tablet Take 1 tablet (1 g total) by mouth 4 (four) times daily -  with meals and at bedtime. 30 tablet 0  . thiamine 100 MG tablet Take 1 tablet (100 mg total) by mouth daily. 30 tablet 0  . traMADol (ULTRAM) 50 MG tablet Take 50 mg by mouth 3 (three) times daily.  0  . traZODone (DESYREL) 100 MG tablet Take 100 mg by mouth at bedtime.  1   No current facility-administered medications for this visit.     Allergies:   Review of patient's allergies indicates no known allergies.   Social History:  The patient  reports that he has quit smoking. His smoking use included Cigarettes. He has a 2.50 pack-year smoking history. He has never used smokeless tobacco. He reports that he drinks about 16.8 oz of alcohol per week . He reports that he uses drugs, including Heroin.   Family History:  The patient's family history includes Heart disease in his mother; Prostate cancer  in his brother; Stroke in his father.  ROS:  Please see the history of present illness.  All other systems are reviewed and otherwise negative.   PHYSICAL EXAM: *** VS:  There were no vitals taken for this visit. BMI: There is no height or weight on file to calculate BMI. Well nourished, well developed, in no acute distress  HEENT: normocephalic, atraumatic  Neck: no JVD, carotid bruits or masses Cardiac:  normal S1, S2; RRR; no significant murmurs, no rubs, or gallops Lungs:  clear to auscultation bilaterally, no wheezing, rhonchi or rales  Abd: soft, nontender,  + BS MS: no deformity or atrophy Ext: no edema  Skin: warm and dry, no rash Neuro:  No gross deficits appreciated Psych: euthymic mood, full affect    Echo: 04/20/2016 - Left ventricle: The cavity size was normal. Systolic function was normal. The estimated ejection fraction was in the range of 55% to 60%. Wall motion was normal; there were no regional wall motion abnormalities. Features are  consistent with a pseudonormal left ventricular filling pattern, with concomitant abnormal relaxation and increased filling pressure (grade 2 diastolic dysfunction). Doppler parameters are consistent with elevated ventricular end-diastolic filling pressure. - Aortic valve: Trileaflet; normal thickness leaflets. There was no regurgitation. - Aortic root: The aortic root was normal in size. - Mitral valve: Structurally normal valve. There was mild regurgitation. - Left atrium: The atrium was moderately dilated. - Right ventricle: The cavity size was normal. Wall thickness was normal. Systolic function was normal. - Right atrium: The atrium was mildly dilated. - Tricuspid valve: There was moderate regurgitation. - Pulmonary arteries: Systolic pressure was mildly increased. PA peak pressure: 33 mm Hg (S). - Inferior vena cava: The vessel was normal in size. - Pericardium, extracardiac: There was no pericardial effusion.  ECG:  07/25 @ 15:13 is atrial fib 07/25 16:13 is ST RBBB is old  Cath: 04/15/2015  Coronary arteries are widely patent. The right coronary, LAD, ramus, and circumflex are very tortuous.  Low normal to mildly depressed LV systolic function with an estimated EF of 45-50%. No regional wall motion abnormalities are noted.  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  Elevated myocardial markers for injury raises the possibility of coronary spasm, embolus, stress cardiomyopathy (atypical), and aborted infarction related to plaque rupture with thrombosis and dissolution.  RECOMMENDATION:  Would discontinue IV nitroglycerin.  Further management per primary team     Recent Labs: 04/19/2016: TSH 4.630 04/29/2016: B Natriuretic Peptide 413.5 05/01/2016: ALT 51; BUN <5; Creatinine, Ser 0.96; Hemoglobin 14.6; Magnesium 1.9; Platelets 135; Potassium 3.7; Sodium 139    04/19/2016: Cholesterol 77; HDL 11; LDL Cholesterol 36; Total CHOL/HDL Ratio 7.0; Triglycerides 151; VLDL 30   Estimated Creatinine Clearance: 82.9 mL/min (by C-G formula based on SCr of 0.96 mg/dL).   Wt Readings from Last 3 Encounters:  05/01/16 229 lb 9.6 oz (104.1 kg)  04/20/16 211 lb 13.8 oz (96.1 kg)  12/13/15 214 lb 11.2 oz (97.4 kg)     Other studies reviewed: Additional studies/records reviewed today include: summarized above  ASSESSMENT AND PLAN:  1. PSVT/ATach/PAfib    CHA2DS2Vasc is at least 3    Not felt to be an a/c candidate for reasons listed above    Started on amiodarone given baseline low BP      *** no symptoms     *** amio labs if taking   Disposition: F/u with ***  Current medicines are reviewed  at length with the patient today.  The patient did not have any concerns regarding medicines.***  Signed, Jennings Books, PA-C 05/05/2016 6:13 AM     [facility] [address] [place] [address] [phone] (office)  [phone] (fax)

## 2016-05-27 ENCOUNTER — Encounter (HOSPITAL_COMMUNITY): Payer: Self-pay | Admitting: Emergency Medicine

## 2016-05-27 ENCOUNTER — Inpatient Hospital Stay (HOSPITAL_COMMUNITY)
Admission: EM | Admit: 2016-05-27 | Discharge: 2016-06-03 | DRG: 641 | Disposition: A | Discharge status: Home Health | Payer: Medicare Other | Attending: Internal Medicine | Admitting: Internal Medicine

## 2016-05-27 DIAGNOSIS — I420 Dilated cardiomyopathy: Secondary | ICD-10-CM | POA: Diagnosis present

## 2016-05-27 DIAGNOSIS — I480 Paroxysmal atrial fibrillation: Secondary | ICD-10-CM | POA: Diagnosis present

## 2016-05-27 DIAGNOSIS — D6959 Other secondary thrombocytopenia: Secondary | ICD-10-CM | POA: Diagnosis not present

## 2016-05-27 DIAGNOSIS — K7031 Alcoholic cirrhosis of liver with ascites: Secondary | ICD-10-CM | POA: Diagnosis present

## 2016-05-27 DIAGNOSIS — C220 Liver cell carcinoma: Secondary | ICD-10-CM | POA: Diagnosis not present

## 2016-05-27 DIAGNOSIS — Z86718 Personal history of other venous thrombosis and embolism: Secondary | ICD-10-CM | POA: Diagnosis not present

## 2016-05-27 DIAGNOSIS — I4581 Long QT syndrome: Secondary | ICD-10-CM | POA: Diagnosis not present

## 2016-05-27 DIAGNOSIS — D7589 Other specified diseases of blood and blood-forming organs: Secondary | ICD-10-CM | POA: Diagnosis present

## 2016-05-27 DIAGNOSIS — E860 Dehydration: Secondary | ICD-10-CM | POA: Diagnosis present

## 2016-05-27 DIAGNOSIS — F10939 Alcohol use, unspecified with withdrawal, unspecified: Secondary | ICD-10-CM

## 2016-05-27 DIAGNOSIS — E039 Hypothyroidism, unspecified: Secondary | ICD-10-CM | POA: Diagnosis not present

## 2016-05-27 DIAGNOSIS — Z87891 Personal history of nicotine dependence: Secondary | ICD-10-CM

## 2016-05-27 DIAGNOSIS — F329 Major depressive disorder, single episode, unspecified: Secondary | ICD-10-CM | POA: Diagnosis present

## 2016-05-27 DIAGNOSIS — R531 Weakness: Secondary | ICD-10-CM

## 2016-05-27 DIAGNOSIS — K219 Gastro-esophageal reflux disease without esophagitis: Secondary | ICD-10-CM | POA: Diagnosis not present

## 2016-05-27 DIAGNOSIS — F10239 Alcohol dependence with withdrawal, unspecified: Secondary | ICD-10-CM | POA: Diagnosis not present

## 2016-05-27 DIAGNOSIS — N390 Urinary tract infection, site not specified: Secondary | ICD-10-CM | POA: Diagnosis not present

## 2016-05-27 DIAGNOSIS — R1011 Right upper quadrant pain: Secondary | ICD-10-CM | POA: Diagnosis not present

## 2016-05-27 DIAGNOSIS — R197 Diarrhea, unspecified: Secondary | ICD-10-CM

## 2016-05-27 DIAGNOSIS — I252 Old myocardial infarction: Secondary | ICD-10-CM

## 2016-05-27 DIAGNOSIS — I110 Hypertensive heart disease with heart failure: Secondary | ICD-10-CM | POA: Diagnosis present

## 2016-05-27 DIAGNOSIS — R000 Tachycardia, unspecified: Secondary | ICD-10-CM | POA: Diagnosis not present

## 2016-05-27 DIAGNOSIS — I5032 Chronic diastolic (congestive) heart failure: Secondary | ICD-10-CM | POA: Diagnosis present

## 2016-05-27 DIAGNOSIS — B1920 Unspecified viral hepatitis C without hepatic coma: Secondary | ICD-10-CM | POA: Diagnosis present

## 2016-05-27 DIAGNOSIS — K7030 Alcoholic cirrhosis of liver without ascites: Secondary | ICD-10-CM | POA: Diagnosis present

## 2016-05-27 DIAGNOSIS — E876 Hypokalemia: Secondary | ICD-10-CM | POA: Diagnosis not present

## 2016-05-27 DIAGNOSIS — E038 Other specified hypothyroidism: Secondary | ICD-10-CM | POA: Diagnosis present

## 2016-05-27 DIAGNOSIS — Z9119 Patient's noncompliance with other medical treatment and regimen: Secondary | ICD-10-CM | POA: Diagnosis not present

## 2016-05-27 DIAGNOSIS — R109 Unspecified abdominal pain: Secondary | ICD-10-CM

## 2016-05-27 DIAGNOSIS — Z79899 Other long term (current) drug therapy: Secondary | ICD-10-CM

## 2016-05-27 HISTORY — DX: Liver cell carcinoma: C22.0

## 2016-05-27 LAB — CBC WITH DIFFERENTIAL/PLATELET
Basophils Absolute: 0 K/uL (ref 0.0–0.1)
Basophils Relative: 0 %
Eosinophils Absolute: 0 K/uL (ref 0.0–0.7)
Eosinophils Relative: 1 %
HCT: 37.3 % — ABNORMAL LOW (ref 39.0–52.0)
Hemoglobin: 13.1 g/dL (ref 13.0–17.0)
Lymphocytes Relative: 18 %
Lymphs Abs: 0.7 K/uL (ref 0.7–4.0)
MCH: 35.5 pg — ABNORMAL HIGH (ref 26.0–34.0)
MCHC: 35.1 g/dL (ref 30.0–36.0)
MCV: 101.1 fL — ABNORMAL HIGH (ref 78.0–100.0)
Monocytes Absolute: 0.8 K/uL (ref 0.1–1.0)
Monocytes Relative: 19 %
Neutro Abs: 2.6 K/uL (ref 1.7–7.7)
Neutrophils Relative %: 62 %
Platelets: 98 K/uL — ABNORMAL LOW (ref 150–400)
RBC: 3.69 MIL/uL — ABNORMAL LOW (ref 4.22–5.81)
RDW: 17.2 % — ABNORMAL HIGH (ref 11.5–15.5)
WBC: 4.1 K/uL (ref 4.0–10.5)

## 2016-05-27 LAB — COMPREHENSIVE METABOLIC PANEL WITH GFR
ALT: 42 U/L (ref 17–63)
AST: 147 U/L — ABNORMAL HIGH (ref 15–41)
Albumin: 1.9 g/dL — ABNORMAL LOW (ref 3.5–5.0)
Alkaline Phosphatase: 113 U/L (ref 38–126)
Anion gap: 7 (ref 5–15)
BUN: <5 mg/dL — ABNORMAL LOW (ref 6–20)
CO2: 24 mmol/L (ref 22–32)
Calcium: 7.8 mg/dL — ABNORMAL LOW (ref 8.9–10.3)
Chloride: 107 mmol/L (ref 101–111)
Creatinine, Ser: 0.89 mg/dL (ref 0.61–1.24)
GFR calc Af Amer: >60 mL/min
GFR calc non Af Amer: >60 mL/min
Glucose, Bld: 114 mg/dL — ABNORMAL HIGH (ref 65–99)
Potassium: 2.8 mmol/L — ABNORMAL LOW (ref 3.5–5.1)
Sodium: 138 mmol/L (ref 135–145)
Total Bilirubin: 1.9 mg/dL — ABNORMAL HIGH (ref 0.3–1.2)
Total Protein: 5.9 g/dL — ABNORMAL LOW (ref 6.5–8.1)

## 2016-05-27 LAB — URINE MICROSCOPIC-ADD ON

## 2016-05-27 LAB — URINALYSIS, ROUTINE W REFLEX MICROSCOPIC
Glucose, UA: NEGATIVE mg/dL
Hgb urine dipstick: NEGATIVE
Ketones, ur: 15 mg/dL — AB
Nitrite: POSITIVE — AB
Protein, ur: 30 mg/dL — AB
Specific Gravity, Urine: 1.021 (ref 1.005–1.030)
pH: 6.5 (ref 5.0–8.0)

## 2016-05-27 LAB — MRSA PCR SCREENING: MRSA by PCR: NEGATIVE

## 2016-05-27 LAB — LIPASE, BLOOD: Lipase: 16 U/L (ref 11–51)

## 2016-05-27 MED ORDER — ONDANSETRON HCL 4 MG PO TABS: 4.0000 mg | ORAL_TABLET | Freq: Four times a day (QID) | ORAL | Status: DC | PRN

## 2016-05-27 MED ORDER — POTASSIUM CHLORIDE IN NACL 20-0.9 MEQ/L-% IV SOLN
INTRAVENOUS | Status: AC
  Administered 2016-05-27 – 2016-05-28 (×3): via INTRAVENOUS
  Filled 2016-05-27 (×3): qty 1000

## 2016-05-27 MED ORDER — SODIUM CHLORIDE 0.9 % IV BOLUS (SEPSIS)
1000.0000 mL | Freq: Once | INTRAVENOUS | Status: AC
  Administered 2016-05-27: 1000 mL via INTRAVENOUS

## 2016-05-27 MED ORDER — SUCRALFATE 1 G PO TABS
1.0000 g | ORAL_TABLET | Freq: Three times a day (TID) | ORAL | Status: DC
  Administered 2016-05-28 – 2016-06-03 (×25): 1 g via ORAL
  Filled 2016-05-27 (×26): qty 1

## 2016-05-27 MED ORDER — LORAZEPAM 1 MG PO TABS
0.0000 mg | ORAL_TABLET | Freq: Four times a day (QID) | ORAL | Status: AC
  Administered 2016-05-28: 2 mg via ORAL
  Filled 2016-05-27: qty 2

## 2016-05-27 MED ORDER — AMIODARONE HCL 200 MG PO TABS
200.0000 mg | ORAL_TABLET | Freq: Two times a day (BID) | ORAL | Status: DC
  Administered 2016-05-27: 200 mg via ORAL
  Filled 2016-05-27: qty 1

## 2016-05-27 MED ORDER — FOLIC ACID 1 MG PO TABS
1.0000 mg | ORAL_TABLET | Freq: Every day | ORAL | Status: DC
  Administered 2016-05-28 – 2016-06-03 (×7): 1 mg via ORAL
  Filled 2016-05-27 (×7): qty 1

## 2016-05-27 MED ORDER — ADULT MULTIVITAMIN W/MINERALS CH
1.0000 | ORAL_TABLET | Freq: Every day | ORAL | Status: DC
  Administered 2016-05-28 – 2016-06-03 (×7): 1 via ORAL
  Filled 2016-05-27 (×7): qty 1

## 2016-05-27 MED ORDER — SIMETHICONE 80 MG PO CHEW: 160.0000 mg | CHEWABLE_TABLET | Freq: Four times a day (QID) | ORAL | Status: DC | PRN

## 2016-05-27 MED ORDER — ACETAMINOPHEN 650 MG RE SUPP: 650.0000 mg | Freq: Four times a day (QID) | RECTAL | Status: DC | PRN

## 2016-05-27 MED ORDER — DIATRIZOATE MEGLUMINE & SODIUM 66-10 % PO SOLN
ORAL | Status: AC
  Administered 2016-05-28
  Filled 2016-05-27: qty 30

## 2016-05-27 MED ORDER — TRAZODONE HCL 100 MG PO TABS
100.0000 mg | ORAL_TABLET | Freq: Every day | ORAL | Status: DC
  Administered 2016-05-27 – 2016-06-01 (×6): 100 mg via ORAL
  Filled 2016-05-27 (×2): qty 1
  Filled 2016-05-27 (×5): qty 2

## 2016-05-27 MED ORDER — METOPROLOL TARTRATE 12.5 MG HALF TABLET
12.5000 mg | ORAL_TABLET | Freq: Two times a day (BID) | ORAL | Status: DC
  Administered 2016-05-28 – 2016-06-01 (×8): 12.5 mg via ORAL
  Filled 2016-05-27 (×9): qty 1

## 2016-05-27 MED ORDER — THIAMINE HCL 100 MG/ML IJ SOLN: 100.0000 mg | Freq: Every day | INTRAMUSCULAR | Status: DC

## 2016-05-27 MED ORDER — LORAZEPAM 1 MG PO TABS
1.0000 mg | ORAL_TABLET | Freq: Four times a day (QID) | ORAL | Status: AC | PRN
  Administered 2016-05-29 – 2016-05-30 (×2): 1 mg via ORAL
  Filled 2016-05-27: qty 1

## 2016-05-27 MED ORDER — LORAZEPAM 1 MG PO TABS
0.0000 mg | ORAL_TABLET | Freq: Two times a day (BID) | ORAL | Status: AC
  Administered 2016-05-31 (×2): 1 mg via ORAL
  Filled 2016-05-27 (×3): qty 1

## 2016-05-27 MED ORDER — PANTOPRAZOLE SODIUM 40 MG PO TBEC
40.0000 mg | DELAYED_RELEASE_TABLET | Freq: Every day | ORAL | Status: DC
  Administered 2016-05-28 – 2016-06-03 (×7): 40 mg via ORAL
  Filled 2016-05-27 (×7): qty 1

## 2016-05-27 MED ORDER — THIAMINE HCL 100 MG PO TABS: 100.0000 mg | ORAL_TABLET | Freq: Every day | ORAL | Status: DC

## 2016-05-27 MED ORDER — LORAZEPAM 2 MG/ML IJ SOLN: 1.0000 mg | Freq: Four times a day (QID) | INTRAMUSCULAR | Status: AC | PRN

## 2016-05-27 MED ORDER — MAGNESIUM SULFATE 2 GM/50ML IV SOLN
2.0000 g | Freq: Once | INTRAVENOUS | Status: AC
  Administered 2016-05-27: 2 g via INTRAVENOUS
  Filled 2016-05-27: qty 50

## 2016-05-27 MED ORDER — ADULT MULTIVITAMIN W/MINERALS CH: 1.0000 | ORAL_TABLET | Freq: Every day | ORAL | Status: DC

## 2016-05-27 MED ORDER — METOPROLOL TARTRATE 12.5 MG HALF TABLET
12.5000 mg | ORAL_TABLET | Freq: Every day | ORAL | Status: DC
  Administered 2016-05-27: 12.5 mg via ORAL
  Filled 2016-05-27: qty 1

## 2016-05-27 MED ORDER — TRAMADOL HCL 50 MG PO TABS
50.0000 mg | ORAL_TABLET | Freq: Three times a day (TID) | ORAL | Status: DC
  Administered 2016-05-27 – 2016-06-03 (×19): 50 mg via ORAL
  Filled 2016-05-27 (×20): qty 1

## 2016-05-27 MED ORDER — SODIUM CHLORIDE 0.9 % IV BOLUS (SEPSIS)
500.0000 mL | Freq: Once | INTRAVENOUS | Status: AC
  Administered 2016-05-27: 500 mL via INTRAVENOUS

## 2016-05-27 MED ORDER — VITAMIN B-1 100 MG PO TABS
100.0000 mg | ORAL_TABLET | Freq: Every day | ORAL | Status: DC
  Administered 2016-05-28 – 2016-06-03 (×7): 100 mg via ORAL
  Filled 2016-05-27 (×7): qty 1

## 2016-05-27 MED ORDER — DEXTROSE 5 % IV SOLN
2.0000 g | INTRAVENOUS | Status: AC
  Administered 2016-05-27 – 2016-06-01 (×6): 2 g via INTRAVENOUS
  Filled 2016-05-27 (×6): qty 2

## 2016-05-27 MED ORDER — POTASSIUM CHLORIDE CRYS ER 20 MEQ PO TBCR
40.0000 meq | EXTENDED_RELEASE_TABLET | Freq: Once | ORAL | Status: AC
  Administered 2016-05-27: 40 meq via ORAL
  Filled 2016-05-27: qty 2

## 2016-05-27 MED ORDER — FOLIC ACID 1 MG PO TABS: 1.0000 mg | ORAL_TABLET | Freq: Every day | ORAL | Status: DC

## 2016-05-27 MED ORDER — POTASSIUM CHLORIDE CRYS ER 20 MEQ PO TBCR
60.0000 meq | EXTENDED_RELEASE_TABLET | Freq: Once | ORAL | Status: AC
  Administered 2016-05-27: 60 meq via ORAL
  Filled 2016-05-27: qty 3

## 2016-05-27 MED ORDER — LORAZEPAM 2 MG/ML IJ SOLN
2.0000 mg | Freq: Once | INTRAMUSCULAR | Status: AC
  Administered 2016-05-27: 2 mg via INTRAVENOUS
  Filled 2016-05-27: qty 1

## 2016-05-27 MED ORDER — ONDANSETRON HCL 4 MG/2ML IJ SOLN: 4.0000 mg | Freq: Four times a day (QID) | INTRAMUSCULAR | Status: DC | PRN

## 2016-05-27 MED ORDER — CEFTRIAXONE SODIUM 1 G IJ SOLR
1.0000 g | Freq: Once | INTRAMUSCULAR | Status: AC
  Administered 2016-05-27: 1 g via INTRAVENOUS
  Filled 2016-05-27: qty 10

## 2016-05-27 MED ORDER — ACETAMINOPHEN 325 MG PO TABS: 650.0000 mg | ORAL_TABLET | Freq: Four times a day (QID) | ORAL | Status: DC | PRN

## 2016-05-27 MED ORDER — METRONIDAZOLE IN NACL 5-0.79 MG/ML-% IV SOLN
500.0000 mg | Freq: Three times a day (TID) | INTRAVENOUS | Status: DC
  Administered 2016-05-27 – 2016-05-30 (×8): 500 mg via INTRAVENOUS
  Filled 2016-05-27 (×10): qty 100

## 2016-05-27 NOTE — ED Provider Notes (Signed)
[facility] DEPT Provider Note   CSN: [id number] Arrival date & time: 05/27/16  1254     History   Chief Complaint Chief Complaint  Patient presents with  . Diarrhea    HPI Nathan Frank is a 75 y.o. male with a pmhx of alcohol abuse, cirrhosis, DVT, polysubstance abuse, thrombocytopenia, who presents to the ED today c/o diarrhea. Pt states that he has been having 5-6 episode of diarrhea for 1 week. He was sent to the ED today by his PCP with concern for dehydration. Pt states that he has occasional cramping abdominal pain that is relieved after passing gas or with a BM. He denies any melena or hematochezia. No N/V, fevers or chills. No recent abx use or sick contacts. Pt has not tried taking anything for his symptoms.   HPI  Past Medical History:  Diagnosis Date  . Abnormal TSH   . Acute gastric ulcer   . Acute gastritis with hemorrhage   . Alcohol abuse   . Alcohol dependence ([place]) 02/02/2014  . Anemia   . Cirrhosis ([place]) 06/04/2012  . Depressive disorder 02/01/2014  . Dizziness and giddiness 12/13/2014  . DVT of lower limb, acute ([place]) 06/06/2012  . Essential hypertension   . Fatty liver   . Gallstones   . Gastritis 06/07/2012  . GERD (gastroesophageal reflux disease)   . GI bleed due to NSAIDs 12/13/2014  . Granulomatous gastritis   . Hematuria 06/04/2012  . Hepatitis C   . Heroin abuse    "I haven't done that since I don't know when."  . Heroin overdose 02/20/2014  . LV dysfunction    a. 04/2015: EF 45-50% by cath.  . Neuropathy ([place])   . NSTEMI (non-ST elevated myocardial infarction) ([place])    a. 04/2015 - patent coronaries. Etiology possibly due to coronary spasm versus embolus, stress cardiomyopathy (atypical), and aborted infarction related to plaque rupture with thrombosis and dissolution. Amlodipine started. Not on antiplatelets due to GIB/cirrhosis history.  . Oral thrush 06/05/2012  . Polysubstance abuse    Rare marijuana.  No EtOH x 2 months.   . Prolonged Q-T  interval on ECG    a. 12/2014 - treated with magnesium.  . Right knee pain 12/13/2014  . S/P alcohol detoxification 02/02/2014  . SVT (supraventricular tachycardia) ([place])    a. 12/2014 in setting of GIB, ETOH, NSAIDS, gastritis.  . Symptomatic cholelithiasis 12/15/2013  . Thrombocytopenia ([place])   . Upper GI bleeding 12/13/2014  . Weight loss 06/04/2012    Patient Active Problem List   Diagnosis Date Noted  . Atrial fibrillation with rapid ventricular response ([place]) 04/28/2016  . Diarrhea 04/28/2016  . SIRS (systemic inflammatory response syndrome) ([place]) 04/19/2016  . Alcohol use disorder, moderate, in controlled environment, dependence ([place])   . Lactic acidosis   . Tachycardia   . Atrial fibrillation with RVR ([place]) 04/18/2016  . Chest pain 04/18/2016  . Hypotension 04/18/2016  . Elevated lactic acid level 04/18/2016  . Atrial flutter with rapid ventricular response ([place]) 04/18/2016  . Chronic atrial fibrillation ([place]) 04/18/2016  . SVT (supraventricular tachycardia) ([place])   . Prolonged Q-T interval on ECG   . Neuropathy ([place])   . Essential hypertension   . Thrombocytopenia ([place]) 04/16/2015  . Abnormal TSH 04/16/2015  . NSTEMI (non-ST elevated myocardial infarction) ([place])   . Acute gastric ulcer   . Acute gastritis with hemorrhage   . GI bleed due to NSAIDs 12/13/2014  . Right knee pain 12/13/2014  . Dizziness and  giddiness 12/13/2014  . Upper GI bleeding 12/13/2014  . Heroin overdose 02/20/2014  . S/P alcohol detoxification 02/02/2014  . Alcohol dependence ([place]) 02/02/2014  . Depressive disorder 02/01/2014  . Symptomatic cholelithiasis 12/15/2013  . Abdominal pain 12/14/2013  . Gastritis 06/07/2012  . DVT of lower limb, acute ([place]) 06/06/2012  . Anemia 06/06/2012  . Oral thrush 06/05/2012  . Alcohol abuse 06/04/2012  . Weight loss 06/04/2012  . Hepatitis C 06/04/2012  . Cirrhosis ([place]) 06/04/2012  . Hematuria 06/04/2012    Past Surgical History:  Procedure Laterality  Date  . CARDIAC CATHETERIZATION N/A 04/15/2015   Procedure: Left Heart Cath and Coronary Angiography;  Surgeon: Belva Crome, MD;  Location: [facility];  Service: Cardiovascular;  Laterality: N/A;  . CIRCUMCISION    . ESOPHAGOGASTRODUODENOSCOPY  06/07/2012   Procedure: ESOPHAGOGASTRODUODENOSCOPY (EGD);  Surgeon: Milus Banister, MD;  Location: [facility];  Service: Endoscopy;  Laterality: N/A;  may need treatment of varices  . ESOPHAGOGASTRODUODENOSCOPY N/A 12/14/2014   Procedure: ESOPHAGOGASTRODUODENOSCOPY (EGD);  Surgeon: Jerene Bears, MD;  [facility] ENDOSCOPY;  Service: Endoscopy;  Laterality: N/A;  . TONSILLECTOMY         Home Medications    Prior to Admission medications   Medication Sig Start Date End Date Taking? Authorizing Provider  amiodarone (PACERONE) 200 MG tablet Take 1 tablet (200 mg total) by mouth 2 (two) times daily. 04/22/16  Yes Kelvin Cellar, MD  metoprolol tartrate (LOPRESSOR) 25 MG tablet Take 0.5 tablets (12.5 mg total) by mouth 2 (two) times daily. Patient taking differently: Take 12.5 mg by mouth daily.  05/01/16  Yes Eugenie Filler, MD  Multiple Vitamin (MULTIVITAMIN WITH MINERALS) TABS tablet Take 1 tablet by mouth daily. 04/21/16  Yes Maryann Mikhail, DO  spironolactone (ALDACTONE) 25 MG tablet Take 0.5 tablets (12.5 mg total) by mouth daily. Patient taking differently: Take 25 mg by mouth daily.  05/01/16  Yes Eugenie Filler, MD  folic acid (FOLVITE) 1 MG tablet Take 1 tablet (1 mg total) by mouth daily. Patient not taking: Reported on 05/27/2016 04/21/16   Velta [place] Mikhail, DO  furosemide (LASIX) 40 MG tablet Take 0.5 tablets (20 mg total) by mouth daily. Patient not taking: Reported on 05/27/2016 05/01/16   Eugenie Filler, MD  omeprazole (PRILOSEC) 40 MG capsule Take 1 capsule (40 mg total) by mouth daily. For acid reflux Patient not taking: Reported on 05/27/2016 12/15/14   Thurnell Lose, MD  simethicone (MYLICON) 80 MG chewable tablet Chew  2 tablets (160 mg total) by mouth 4 (four) times daily as needed for flatulence. Patient not taking: Reported on 05/27/2016 05/01/16   Eugenie Filler, MD  sucralfate (CARAFATE) 1 g tablet Take 1 tablet (1 g total) by mouth 4 (four) times daily -  with meals and at bedtime. 04/22/16   Kelvin Cellar, MD  thiamine 100 MG tablet Take 1 tablet (100 mg total) by mouth daily. Patient not taking: Reported on 05/27/2016 04/21/16   Velta [place] Mikhail, DO  traMADol (ULTRAM) 50 MG tablet Take 50 mg by mouth 3 (three) times daily. 03/04/16   Historical Provider, MD  traZODone (DESYREL) 100 MG tablet Take 100 mg by mouth at bedtime. 02/20/16   Historical Provider, MD    Family History Family History  Problem Relation Age of Onset  . Stroke Father   . Prostate cancer Brother   . Heart disease Mother     Pacemaker    Social History Social History  Substance  Use Topics  . Smoking status: Former Smoker    Packs/day: 0.50    Years: 5.00    Types: Cigarettes  . Smokeless tobacco: Never Used     Comment: "quit smoking cigarettes in the 1970's"  . Alcohol use 16.8 oz/week    28 Glasses of wine per week     Comment: drinks 1 pint of wine/day      Allergies   Review of patient's allergies indicates no known allergies.   Review of Systems Review of Systems  All other systems reviewed and are negative.    Physical Exam Updated Vital Signs BP 106/84   Pulse (!) 128   Temp 98.3 F (36.8 C) (Oral)   Resp 23   Ht 6' (1.829 m)   Wt 102.1 kg   SpO2 98%   BMI 30.52 kg/m   Physical Exam  Constitutional: He is oriented to person, place, and time. No distress.  Elderly  HENT:  Head: Normocephalic and atraumatic.  Mouth/Throat: No oropharyngeal exudate.  Eyes: Conjunctivae and EOM are normal. Pupils are equal, round, and reactive to light. Right eye exhibits no discharge. Left eye exhibits no discharge. No scleral icterus.  Cardiovascular: Normal rate, regular rhythm, normal heart sounds and  intact distal pulses.  Exam reveals no gallop and no friction rub.   No murmur heard. Pulmonary/Chest: Effort normal and breath sounds normal. No respiratory distress. He has no wheezes. He has no rales. He exhibits no tenderness.  Abdominal: Soft. Bowel sounds are normal. He exhibits no distension. There is no tenderness. There is no guarding.  Musculoskeletal: Normal range of motion. He exhibits no edema.  Neurological: He is alert and oriented to person, place, and time.  Skin: Skin is warm and dry. No rash noted. He is not diaphoretic. No erythema. No pallor.  Psychiatric: He has a normal mood and affect. His behavior is normal.  Nursing note and vitals reviewed.    ED Treatments / Results  Labs (all labs ordered are listed, but only abnormal results are displayed) Labs Reviewed  COMPREHENSIVE METABOLIC PANEL - Abnormal; Notable for the following:       Result Value   Potassium 2.8 (*)    Glucose, Bld 114 (*)    BUN <5 (*)    Calcium 7.8 (*)    Total Protein 5.9 (*)    Albumin 1.9 (*)    AST 147 (*)    Total Bilirubin 1.9 (*)    All other components within normal limits  CBC WITH DIFFERENTIAL/PLATELET - Abnormal; Notable for the following:    RBC 3.69 (*)    HCT 37.3 (*)    MCV 101.1 (*)    MCH 35.5 (*)    RDW 17.2 (*)    Platelets 98 (*)    All other components within normal limits  LIPASE, BLOOD  URINALYSIS, ROUTINE W REFLEX MICROSCOPIC (NOT AT [facility])    EKG  EKG Interpretation None       Radiology No results found.  Procedures Procedures (including critical care time)  Medications Ordered in ED Medications  sodium chloride 0.9 % bolus 500 mL (not administered)  potassium chloride SA (K-DUR,KLOR-CON) CR tablet 60 mEq (not administered)  magnesium sulfate IVPB 2 g 50 mL (not administered)     Initial Impression / Assessment and Plan / ED Course  I have reviewed the triage vital signs and the nursing notes.  Pertinent labs & imaging results that were  available during my care of the patient were  reviewed by me and considered in my medical decision making (see chart for details).  Clinical Course    75 year old mal with puslike a history of polysubstance abuse presents to the ED today complaining of ongoing diarrhea for the last week. He was sent here by his PCP with concern for dehydration. Patient appears well in ED, in no apparent distress. Patient is tachycardic to 120 bpm. EKG is normal sinus rhythm. Patient appears to been tachycardic similar heart rate on previous visits to the ED. Labs were remarkable for hypokalemia at 2.8. Repleted in ED. Patient also given magnesium. Patient given 2 L IV fluids. Patient signed out to Delos Haring PA-C at shift change pending administration of fluids, medications and urinalysis. Anticipate discharge. If UA positive, treat accordingly. D/c with Rx for immodium.  Patient was discussed with and seen by Dr. Laneta Simmers who agrees with the treatment plan.    Final Clinical Impressions(s) / ED Diagnoses   Final diagnoses:  Diarrhea, unspecified type    New Prescriptions New Prescriptions   No medications on file     Carlos Levering, PA-C 05/27/16 1623    Leo Grosser, MD 05/27/16 1758

## 2016-05-27 NOTE — ED Notes (Signed)
Utilized [place] to start IV; cannulated artery right prox forearm.  Removed and held pressure for 5 min.

## 2016-05-27 NOTE — ED Triage Notes (Signed)
Pt reports diarrhea x one week.  Wife reports he is not drinking well.  Sent here after being seen at dr office.

## 2016-05-27 NOTE — H&P (Signed)
History and Physical    Nathan Frank [id number] DOB: January 29, 1941 DOA: 05/27/2016  PCP: Thressa Sheller, MD  Patient coming from: Home.  Chief Complaint: Weakness and diarrhea.  HPI: Nathan Frank is a 75 y.o. male with alcohol abuse, atrial fibrillation, cirrhosis of the liver, hepatitis C present to the ER because of weakness and diarrhea. Patient states he has been feeling fatigued and weak over the last 2 days with constant diarrhea over the last 1 week. Denies any nausea vomiting. Has been having crampy abdominal pain. Denies any sick contacts or recent travel. Patient is tachycardic in the 130s. Patient states he has not taken any of his medications last 1 week. Was recently admitted last month for similar complaints with patient be also in A. fib with RVR. At that time patient was placed on amiodarone. Last dose taken as per the patient was a week ago. Patient was given fluid bolus in the ER and admitted for further management. Patient states his diarrhea was watery denies any blood in the diarrhea. Denies any chest pain or shortness of breath.  ED Course: Patient was given fluid bolus in the ER.  Review of Systems: As per HPI, rest all negative.   Past Medical History:  Diagnosis Date  . Abnormal TSH   . Acute gastric ulcer   . Acute gastritis with hemorrhage   . Alcohol abuse   . Alcohol dependence ([place]) 02/02/2014  . Anemia   . Cirrhosis ([place]) 06/04/2012  . Depressive disorder 02/01/2014  . Dizziness and giddiness 12/13/2014  . DVT of lower limb, acute ([place]) 06/06/2012  . Essential hypertension   . Fatty liver   . Gallstones   . Gastritis 06/07/2012  . GERD (gastroesophageal reflux disease)   . GI bleed due to NSAIDs 12/13/2014  . Granulomatous gastritis   . Hematuria 06/04/2012  . Hepatitis C   . Heroin abuse    "I haven't done that since I don't know when."  . Heroin overdose 02/20/2014  . LV dysfunction    a. 04/2015: EF 45-50% by cath.  . Neuropathy ([place])   .  NSTEMI (non-ST elevated myocardial infarction) ([place])    a. 04/2015 - patent coronaries. Etiology possibly due to coronary spasm versus embolus, stress cardiomyopathy (atypical), and aborted infarction related to plaque rupture with thrombosis and dissolution. Amlodipine started. Not on antiplatelets due to GIB/cirrhosis history.  . Oral thrush 06/05/2012  . Polysubstance abuse    Rare marijuana.  No EtOH x 2 months.   . Prolonged Q-T interval on ECG    a. 12/2014 - treated with magnesium.  . Right knee pain 12/13/2014  . S/P alcohol detoxification 02/02/2014  . SVT (supraventricular tachycardia) ([place])    a. 12/2014 in setting of GIB, ETOH, NSAIDS, gastritis.  . Symptomatic cholelithiasis 12/15/2013  . Thrombocytopenia ([place])   . Upper GI bleeding 12/13/2014  . Weight loss 06/04/2012    Past Surgical History:  Procedure Laterality Date  . CARDIAC CATHETERIZATION N/A 04/15/2015   Procedure: Left Heart Cath and Coronary Angiography;  Surgeon: Belva Crome, MD;  Location: [facility];  Service: Cardiovascular;  Laterality: N/A;  . CIRCUMCISION    . ESOPHAGOGASTRODUODENOSCOPY  06/07/2012   Procedure: ESOPHAGOGASTRODUODENOSCOPY (EGD);  Surgeon: Milus Banister, MD;  Location: [facility];  Service: Endoscopy;  Laterality: N/A;  may need treatment of varices  . ESOPHAGOGASTRODUODENOSCOPY N/A 12/14/2014   Procedure: ESOPHAGOGASTRODUODENOSCOPY (EGD);  Surgeon: Jerene Bears, MD;  Location: [facility] END[facility];  Service: Endoscopy;  Laterality: N/A;  . TONSILLECTOMY       reports that he has quit smoking. His smoking use included Cigarettes. He has a 2.50 pack-year smoking history. He has never used smokeless tobacco. He reports that he drinks about 16.8 oz of alcohol per week . He reports that he does not use drugs.  No Known Allergies  Family History  Problem Relation Age of Onset  . Stroke Father   . Prostate cancer Brother   . Heart disease Mother     Pacemaker    Prior to Admission medications     Medication Sig Start Date End Date Taking? Authorizing Provider  amiodarone (PACERONE) 200 MG tablet Take 1 tablet (200 mg total) by mouth 2 (two) times daily. 04/22/16  Yes Kelvin Cellar, MD  metoprolol tartrate (LOPRESSOR) 25 MG tablet Take 0.5 tablets (12.5 mg total) by mouth 2 (two) times daily. Patient taking differently: Take 12.5 mg by mouth daily.  05/01/16  Yes Eugenie Filler, MD  Multiple Vitamin (MULTIVITAMIN WITH MINERALS) TABS tablet Take 1 tablet by mouth daily. 04/21/16  Yes Maryann Mikhail, DO  spironolactone (ALDACTONE) 25 MG tablet Take 0.5 tablets (12.5 mg total) by mouth daily. Patient taking differently: Take 25 mg by mouth daily.  05/01/16  Yes Eugenie Filler, MD  folic acid (FOLVITE) 1 MG tablet Take 1 tablet (1 mg total) by mouth daily. Patient not taking: Reported on 05/27/2016 04/21/16   Velta Addison Mikhail, DO  furosemide (LASIX) 40 MG tablet Take 0.5 tablets (20 mg total) by mouth daily. Patient not taking: Reported on 05/27/2016 05/01/16   Eugenie Filler, MD  omeprazole (PRILOSEC) 40 MG capsule Take 1 capsule (40 mg total) by mouth daily. For acid reflux Patient not taking: Reported on 05/27/2016 12/15/14   Thurnell Lose, MD  simethicone (MYLICON) 80 MG chewable tablet Chew 2 tablets (160 mg total) by mouth 4 (four) times daily as needed for flatulence. Patient not taking: Reported on 05/27/2016 05/01/16   Eugenie Filler, MD  sucralfate (CARAFATE) 1 g tablet Take 1 tablet (1 g total) by mouth 4 (four) times daily -  with meals and at bedtime. 04/22/16   Kelvin Cellar, MD  thiamine 100 MG tablet Take 1 tablet (100 mg total) by mouth daily. Patient not taking: Reported on 05/27/2016 04/21/16   Velta Addison Mikhail, DO  traMADol (ULTRAM) 50 MG tablet Take 50 mg by mouth 3 (three) times daily. 03/04/16   Historical Provider, MD  traZODone (DESYREL) 100 MG tablet Take 100 mg by mouth at bedtime. 02/20/16   Historical Provider, MD    Physical Exam: Vitals:   05/27/16 1903  05/27/16 1940 05/27/16 2000 05/27/16 2030  BP: 96/67 100/84 108/83 114/90  Pulse: (!) 125 (!) 129 (!) 127 (!) 103  Resp: 19 18 21 19   Temp:    98 F (36.7 C)  TempSrc:    Oral  SpO2: 91% 96% 94% 98%  Weight:    215 lb 13.3 oz (97.9 kg)  Height:    6' (1.829 m)      Constitutional: Not in distress. Vitals:   05/27/16 1903 05/27/16 1940 05/27/16 2000 05/27/16 2030  BP: 96/67 100/84 108/83 114/90  Pulse: (!) 125 (!) 129 (!) 127 (!) 103  Resp: 19 18 21 19   Temp:    98 F (36.7 C)  TempSrc:    Oral  SpO2: 91% 96% 94% 98%  Weight:    215 lb 13.3 oz (97.9 kg)  Height:  6' (1.829 m)   Eyes: Anicteric no pallor. ENMT: No discharge from the ears eyes nose or mouth. Neck: No mass felt. No JVD appreciated. Respiratory: No rhonchi or crepitations. Cardiovascular: S1 and S2 heard. Abdomen: Soft nontender bowel sounds present. No guarding or rigidity. Musculoskeletal: No edema. Skin: No rash. Neurologic: Alert awake oriented to time place and person. Moves all extremities. Psychiatric: Appears normal.   Labs on Admission: I have personally reviewed following labs and imaging studies  CBC:  Recent Labs Lab 05/27/16 1348  WBC 4.1  NEUTROABS 2.6  HGB 13.1  HCT 37.3*  MCV 101.1*  PLT 98*   Basic Metabolic Panel:  Recent Labs Lab 05/27/16 1348  NA 138  K 2.8*  CL 107  CO2 24  GLUCOSE 114*  BUN <5*  CREATININE 0.89  CALCIUM 7.8*   GFR: Estimated Creatinine Clearance: 86.9 mL/min (by C-G formula based on SCr of 0.89 mg/dL). Liver Function Tests:  Recent Labs Lab 05/27/16 1348  AST 147*  ALT 42  ALKPHOS 113  BILITOT 1.9*  PROT 5.9*  ALBUMIN 1.9*    Recent Labs Lab 05/27/16 1348  LIPASE 16   No results for input(s): AMMONIA in the last 168 hours. Coagulation Profile: No results for input(s): INR, PROTIME in the last 168 hours. Cardiac Enzymes: No results for input(s): CKTOTAL, CKMB, CKMBINDEX, TROPONINI in the last 168 hours. BNP (last 3  results) No results for input(s): PROBNP in the last 8760 hours. HbA1C: No results for input(s): HGBA1C in the last 72 hours. CBG: No results for input(s): GLUCAP in the last 168 hours. Lipid Profile: No results for input(s): CHOL, HDL, LDLCALC, TRIG, CHOLHDL, LDLDIRECT in the last 72 hours. Thyroid Function Tests: No results for input(s): TSH, T4TOTAL, FREET4, T3FREE, THYROIDAB in the last 72 hours. Anemia Panel: No results for input(s): VITAMINB12, FOLATE, FERRITIN, TIBC, IRON, RETICCTPCT in the last 72 hours. Urine analysis:    Component Value Date/Time   COLORURINE ORANGE (A) 05/27/2016 1609   APPEARANCEUR CLOUDY (A) 05/27/2016 1609   LABSPEC 1.021 05/27/2016 1609   PHURINE 6.5 05/27/2016 1609   GLUCOSEU NEGATIVE 05/27/2016 1609   HGBUR NEGATIVE 05/27/2016 1609   BILIRUBINUR MODERATE (A) 05/27/2016 1609   KETONESUR 15 (A) 05/27/2016 1609   PROTEINUR 30 (A) 05/27/2016 1609   UROBILINOGEN 2.0 (H) 12/13/2014 1256   NITRITE POSITIVE (A) 05/27/2016 1609   LEUKOCYTESUR SMALL (A) 05/27/2016 1609   Sepsis Labs: @LABRCNTIP (procalcitonin:4,lacticidven:4) )No results found for this or any previous visit (from the past 240 hour(s)).   Radiological Exams on Admission: No results found.  EKG: Independently reviewed. Sinus tachycardia.  Assessment/Plan Principal Problem:   Generalized weakness Active Problems:   Diarrhea   Alcohol withdrawal (HCC)   Hypokalemia   Dehydration    1. Persistent diarrhea - will check stool studies including C. difficile and GI pathogen panel. Check CT abdomen and pelvis. Continue with hydration. Have placed patient on ceftriaxone and Flagyl since patient has Abdominal pain. Check lactic acid levels. 2. Generalized weakness probably from diarrhea and dehydration - closely observe. Check TSH and troponin. 3. Hypokalemia and hypomagnesemia probably from diarrhea - replace and recheck. 4. Tachycardia with history of A. Fib - monitor and EKG showing  sinus tachycardia. But we'll closely monitor for any conversion to A. fib or atrial flutter. At this time will keep patient on metoprolol if blood pressure allows. May need to restart amiodarone. Tachycardia could also be from alcohol withdrawal 5. Alcohol abuse - states his last drink was  4 days ago. Patient is placed on CIWA protocol. 6. History of cirrhosis with history of hepatitis C - has not been taking his Lasix and spironolactone last 1 week. Presently receiving IV fluids. 7. Thrombocytopenia and macrocytosis probably from alcoholism - closely follow CBC. 8. History of A. Fib - not on anticoagulants secondary to history of GI bleed. Chads 2 vasc score is 4. Have placed patient on metoprolol and will restart amiodarone after discussing with cardiologist.   DVT prophylaxis: SCDs. Code Status: Full code.  Family Communication: Discussed with patient.  Disposition Plan: Home.  Consults called: None.  Admission status: Observation. Stepdown.    Rise Patience MD [facility] Pager [phone].  If 7PM-7AM, please contact night-coverage [email or web address] Password [facility]  05/27/2016, 10:09 PM

## 2016-05-27 NOTE — ED Provider Notes (Signed)
PATIENT SIGN OUT                                                           Patient acquired out from [facility], PA-C at end of shift.  Summary: Patient presented to the emergency department due to having dehydration because of diarrhea for 1 week. His past medical history of alcohol abuse, cirrhosis, DVT, polysubstance abuse. He was seen by my colleague and signed out at shift change. The plan was to wait for him to receive his fluids and for the urinalysis to resolve. The urine shows that he has a nitrite positive UTI, despite 2.5 L of fluids the patient continues to be tachycardic at 120 to 130. He reports being shaky and not feeling well. He was given 2 mg of IV Ativan with concerns of withdrawal, this did not improve his tachycardia.  At this time I will admit to medicine.  Possible etiologies are early sepsis due to UTI, tachycardia from dehydration or most likely ETOH withdrawal.   Pt awake and alert, asking to stay in the hospital and requests a sandwich per RN, which he ate with no difficulty.  Dr. Hal Hope has agreed for Obs, stepdown admission, [place], [facility].   Temp admit orders placed including CIWA .   Delos Haring, PA-C 05/27/16 [place], PA-C 05/27/16 [place] Yao, MD 05/30/16 2135

## 2016-05-27 NOTE — Progress Notes (Addendum)
Pharmacy Antibiotic Note  Nathan Frank is a 75 y.o. male with an intra-abdominal infection. Pharmacy has been consulted for Cipro  Dosing. Patient noted with QTc prolongation. Spoke with Dr. Hal Hope and to change to rocephin (with flagyl). Will also stop amiodarone -CrCl ~ 85  Plan: -Ceftriaxone 2gm IV q24h -Will sign off. Please contact pharmacy with any other needs.  Hildred Laser, Pharm D 05/27/2016 10:13 PM   Height: 6' (182.9 cm) Weight: 215 lb 13.3 oz (97.9 kg) IBW/kg (Calculated) : 77.6  Temp (24hrs), Avg:98.2 F (36.8 C), Min:98 F (36.7 C), Max:98.3 F (36.8 C)   Recent Labs Lab 05/27/16 1348  WBC 4.1  CREATININE 0.89    Estimated Creatinine Clearance: 86.9 mL/min (by C-G formula based on SCr of 0.89 mg/dL).    No Known Allergies  Antimicrobials this admission: 8/23 ceftriaxone 8/23 flagyl  Dose adjustments this admission: n/a

## 2016-05-28 ENCOUNTER — Encounter (HOSPITAL_COMMUNITY): Payer: Self-pay | Admitting: Radiology

## 2016-05-28 ENCOUNTER — Observation Stay (HOSPITAL_COMMUNITY): Payer: Medicare Other

## 2016-05-28 DIAGNOSIS — B1920 Unspecified viral hepatitis C without hepatic coma: Secondary | ICD-10-CM

## 2016-05-28 DIAGNOSIS — I4581 Long QT syndrome: Secondary | ICD-10-CM | POA: Diagnosis present

## 2016-05-28 DIAGNOSIS — D6959 Other secondary thrombocytopenia: Secondary | ICD-10-CM | POA: Diagnosis not present

## 2016-05-28 DIAGNOSIS — R531 Weakness: Secondary | ICD-10-CM | POA: Diagnosis not present

## 2016-05-28 DIAGNOSIS — I110 Hypertensive heart disease with heart failure: Secondary | ICD-10-CM | POA: Diagnosis present

## 2016-05-28 DIAGNOSIS — E876 Hypokalemia: Secondary | ICD-10-CM | POA: Diagnosis present

## 2016-05-28 DIAGNOSIS — I5032 Chronic diastolic (congestive) heart failure: Secondary | ICD-10-CM | POA: Diagnosis present

## 2016-05-28 DIAGNOSIS — R000 Tachycardia, unspecified: Secondary | ICD-10-CM | POA: Diagnosis present

## 2016-05-28 DIAGNOSIS — K7031 Alcoholic cirrhosis of liver with ascites: Secondary | ICD-10-CM | POA: Diagnosis present

## 2016-05-28 DIAGNOSIS — E039 Hypothyroidism, unspecified: Secondary | ICD-10-CM | POA: Diagnosis present

## 2016-05-28 DIAGNOSIS — I252 Old myocardial infarction: Secondary | ICD-10-CM | POA: Diagnosis not present

## 2016-05-28 DIAGNOSIS — K8020 Calculus of gallbladder without cholecystitis without obstruction: Secondary | ICD-10-CM | POA: Diagnosis not present

## 2016-05-28 DIAGNOSIS — I420 Dilated cardiomyopathy: Secondary | ICD-10-CM | POA: Diagnosis not present

## 2016-05-28 DIAGNOSIS — K760 Fatty (change of) liver, not elsewhere classified: Secondary | ICD-10-CM | POA: Diagnosis not present

## 2016-05-28 DIAGNOSIS — R197 Diarrhea, unspecified: Secondary | ICD-10-CM | POA: Diagnosis not present

## 2016-05-28 DIAGNOSIS — C220 Liver cell carcinoma: Secondary | ICD-10-CM | POA: Diagnosis not present

## 2016-05-28 DIAGNOSIS — R1011 Right upper quadrant pain: Secondary | ICD-10-CM | POA: Diagnosis not present

## 2016-05-28 DIAGNOSIS — Z87891 Personal history of nicotine dependence: Secondary | ICD-10-CM | POA: Diagnosis not present

## 2016-05-28 DIAGNOSIS — K219 Gastro-esophageal reflux disease without esophagitis: Secondary | ICD-10-CM | POA: Diagnosis present

## 2016-05-28 DIAGNOSIS — E860 Dehydration: Secondary | ICD-10-CM | POA: Diagnosis not present

## 2016-05-28 DIAGNOSIS — Z452 Encounter for adjustment and management of vascular access device: Secondary | ICD-10-CM | POA: Diagnosis not present

## 2016-05-28 DIAGNOSIS — Z9119 Patient's noncompliance with other medical treatment and regimen: Secondary | ICD-10-CM | POA: Diagnosis not present

## 2016-05-28 DIAGNOSIS — D7589 Other specified diseases of blood and blood-forming organs: Secondary | ICD-10-CM | POA: Diagnosis present

## 2016-05-28 DIAGNOSIS — I480 Paroxysmal atrial fibrillation: Secondary | ICD-10-CM | POA: Diagnosis present

## 2016-05-28 DIAGNOSIS — F10239 Alcohol dependence with withdrawal, unspecified: Secondary | ICD-10-CM | POA: Diagnosis present

## 2016-05-28 DIAGNOSIS — N390 Urinary tract infection, site not specified: Secondary | ICD-10-CM | POA: Diagnosis present

## 2016-05-28 DIAGNOSIS — Z86718 Personal history of other venous thrombosis and embolism: Secondary | ICD-10-CM | POA: Diagnosis not present

## 2016-05-28 LAB — CBC WITH DIFFERENTIAL/PLATELET
Basophils Absolute: 0 10*3/uL (ref 0.0–0.1)
Basophils Relative: 0 %
EOS ABS: 0.1 10*3/uL (ref 0.0–0.7)
Eosinophils Relative: 2 %
HCT: 38.1 % — ABNORMAL LOW (ref 39.0–52.0)
Hemoglobin: 13 g/dL (ref 13.0–17.0)
LYMPHS ABS: 0.8 10*3/uL (ref 0.7–4.0)
Lymphocytes Relative: 22 %
MCH: 35.5 pg — AB (ref 26.0–34.0)
MCHC: 34.1 g/dL (ref 30.0–36.0)
MCV: 104.1 fL — AB (ref 78.0–100.0)
Monocytes Absolute: 0.6 10*3/uL (ref 0.1–1.0)
Monocytes Relative: 16 %
NEUTROS PCT: 60 %
Neutro Abs: 2.1 10*3/uL (ref 1.7–7.7)
PLATELETS: 83 10*3/uL — AB (ref 150–400)
RBC: 3.66 MIL/uL — AB (ref 4.22–5.81)
RDW: 17.9 % — AB (ref 11.5–15.5)
WBC: 3.6 10*3/uL — AB (ref 4.0–10.5)

## 2016-05-28 LAB — TROPONIN I
Troponin I: 0.03 ng/mL (ref <0.03)
Troponin I: 0.03 ng/mL (ref <0.03)

## 2016-05-28 LAB — GASTROINTESTINAL PANEL BY PCR, STOOL (REPLACES STOOL CULTURE)
Adenovirus F40/41: NOT DETECTED
Astrovirus: NOT DETECTED
CAMPYLOBACTER SPECIES: NOT DETECTED
Cryptosporidium: NOT DETECTED
Cyclospora cayetanensis: NOT DETECTED
E. COLI O157: NOT DETECTED
ENTEROAGGREGATIVE E COLI (EAEC): NOT DETECTED
ENTEROTOXIGENIC E COLI (ETEC): NOT DETECTED
Entamoeba histolytica: NOT DETECTED
Enteropathogenic E coli (EPEC): NOT DETECTED
GIARDIA LAMBLIA: NOT DETECTED
NOROVIRUS GI/GII: NOT DETECTED
PLESIMONAS SHIGELLOIDES: NOT DETECTED
Rotavirus A: NOT DETECTED
SALMONELLA SPECIES: NOT DETECTED
SAPOVIRUS (I, II, IV, AND V): NOT DETECTED
SHIGELLA/ENTEROINVASIVE E COLI (EIEC): NOT DETECTED
Shiga like toxin producing E coli (STEC): NOT DETECTED
Vibrio cholerae: NOT DETECTED
Vibrio species: NOT DETECTED
Yersinia enterocolitica: NOT DETECTED

## 2016-05-28 LAB — C DIFFICILE QUICK SCREEN W PCR REFLEX
C DIFFICILE (CDIFF) TOXIN: NEGATIVE
C DIFFICLE (CDIFF) ANTIGEN: NEGATIVE
C Diff interpretation: NOT DETECTED

## 2016-05-28 LAB — RAPID URINE DRUG SCREEN, HOSP PERFORMED
Amphetamines: NOT DETECTED
BARBITURATES: NOT DETECTED
Benzodiazepines: POSITIVE — AB
COCAINE: NOT DETECTED
Opiates: NOT DETECTED
TETRAHYDROCANNABINOL: NOT DETECTED

## 2016-05-28 LAB — COMPREHENSIVE METABOLIC PANEL
ALK PHOS: 97 U/L (ref 38–126)
ALT: 36 U/L (ref 17–63)
ANION GAP: 4 — AB (ref 5–15)
AST: 126 U/L — ABNORMAL HIGH (ref 15–41)
Albumin: 1.9 g/dL — ABNORMAL LOW (ref 3.5–5.0)
BUN: <5 mg/dL — ABNORMAL LOW (ref 6–20)
CALCIUM: 7.1 mg/dL — AB (ref 8.9–10.3)
CO2: 22 mmol/L (ref 22–32)
Chloride: 112 mmol/L — ABNORMAL HIGH (ref 101–111)
Creatinine, Ser: 0.85 mg/dL (ref 0.61–1.24)
Glucose, Bld: 122 mg/dL — ABNORMAL HIGH (ref 65–99)
Potassium: 3.8 mmol/L (ref 3.5–5.1)
SODIUM: 138 mmol/L (ref 135–145)
TOTAL PROTEIN: 5.8 g/dL — AB (ref 6.5–8.1)
Total Bilirubin: 1.8 mg/dL — ABNORMAL HIGH (ref 0.3–1.2)

## 2016-05-28 LAB — LIPASE, BLOOD: LIPASE: 19 U/L (ref 11–51)

## 2016-05-28 LAB — PHOSPHORUS: PHOSPHORUS: 2.8 mg/dL (ref 2.5–4.6)

## 2016-05-28 LAB — MAGNESIUM
MAGNESIUM: 1.9 mg/dL (ref 1.7–2.4)
Magnesium: 1.6 mg/dL — ABNORMAL LOW (ref 1.7–2.4)

## 2016-05-28 LAB — RAPID HIV SCREEN (HIV 1/2 AB+AG)
HIV 1/2 Antibodies: NONREACTIVE
HIV-1 P24 ANTIGEN - HIV24: NONREACTIVE

## 2016-05-28 LAB — LACTIC ACID, PLASMA
LACTIC ACID, VENOUS: 2.9 mmol/L — AB (ref 0.5–1.9)
Lactic Acid, Venous: 2.2 mmol/L (ref 0.5–1.9)

## 2016-05-28 LAB — TSH: TSH: 29.993 u[IU]/mL — AB (ref 0.350–4.500)

## 2016-05-28 MED ORDER — ENOXAPARIN SODIUM 40 MG/0.4ML ~~LOC~~ SOLN
40.0000 mg | SUBCUTANEOUS | Status: DC
  Administered 2016-05-29 – 2016-06-02 (×5): 40 mg via SUBCUTANEOUS
  Filled 2016-05-28 (×6): qty 0.4

## 2016-05-28 MED ORDER — SODIUM CHLORIDE 0.9 % IV BOLUS (SEPSIS)
1000.0000 mL | Freq: Once | INTRAVENOUS | Status: AC
  Administered 2016-05-28: 1000 mL via INTRAVENOUS

## 2016-05-28 MED ORDER — POTASSIUM CHLORIDE IN NACL 20-0.9 MEQ/L-% IV SOLN
INTRAVENOUS | Status: DC
  Administered 2016-05-28 – 2016-05-29 (×2): via INTRAVENOUS
  Filled 2016-05-28: qty 1000

## 2016-05-28 MED ORDER — LEVOTHYROXINE SODIUM 100 MCG IV SOLR
25.0000 ug | Freq: Every day | INTRAVENOUS | Status: DC
  Administered 2016-05-28 – 2016-06-01 (×5): 25 ug via INTRAVENOUS
  Filled 2016-05-28 (×5): qty 5

## 2016-05-28 MED ORDER — ASPIRIN EC 81 MG PO TBEC
81.0000 mg | DELAYED_RELEASE_TABLET | Freq: Every day | ORAL | Status: DC
  Administered 2016-05-28 – 2016-06-03 (×7): 81 mg via ORAL
  Filled 2016-05-28 (×9): qty 1

## 2016-05-28 MED ORDER — IOPAMIDOL (ISOVUE-300) INJECTION 61%
INTRAVENOUS | Status: AC
  Administered 2016-05-28: 100 mL
  Filled 2016-05-28: qty 100

## 2016-05-28 MED ORDER — MAGNESIUM SULFATE 2 GM/50ML IV SOLN
2.0000 g | Freq: Once | INTRAVENOUS | Status: AC
  Administered 2016-05-28: 2 g via INTRAVENOUS
  Filled 2016-05-28: qty 50

## 2016-05-28 NOTE — Care Management Obs Status (Signed)
[place] NOTIFICATION   Patient Details  Name: Nathan Frank MRN: [id number] Date of Birth: [birth year]   Medicare Observation Status Notification Given:  Yes    [name], RN 05/28/2016, 2:49 PM

## 2016-05-28 NOTE — Progress Notes (Signed)
ANTICOAGULATION CONSULT NOTE - Initial Consult  Pharmacy Consult for Lovenox Indication: VTE prophylaxis  No Known Allergies  Patient Measurements: Height: 6' (182.9 cm) Weight: 222 lb 10.6 oz (101 kg) IBW/kg (Calculated) : 77.6 Heparin Dosing Weight: n/a  Labs:  Recent Labs  05/27/16 1348 05/27/16 2257 05/28/16 0953  HGB 13.1  --  13.0  HCT 37.3*  --  38.1*  PLT 98*  --  83*  CREATININE 0.89  --  0.85  TROPONINI  --  0.03* 0.03*    Estimated Creatinine Clearance: 92.4 mL/min (by C-G formula based on SCr of 0.85 mg/dL).  Assessment: 75 yo male with hx afib admitted with weakness and diarrhea.  Not on anticoagulation for afib prior to admission due to hx GIB.  Pharmacy asked to dose lovenox for DVT prophylaxis.  No current bleeding noted.  Given hx bleeding will avoid 0.5 mg/kg Lovenox dosing.  Goal of Therapy:  Monitor platelets by anticoagulation protocol: Yes   Plan:  1. Lovenox 40 mg sq daily. 2. Pharmacy will sign-off, please contact if questions.  Uvaldo Rising, BCPS  Clinical Pharmacist Pager [phone]  05/28/2016 9:22 PM

## 2016-05-28 NOTE — Progress Notes (Signed)
CRITICAL VALUE ALERT  Critical value received:  2.2  Date of notification:  05/28/2016 Time of notification: 11:56 Critical value read back: yes  Nurse who received alert: Sherri Mcarthy T. MD notified (1st page): dR. Dia Crawford Time of first page:  11:58 MD notified (2nd page): N/A  Time of second page: N/A  Responding MD:  Dr. Dia Crawford  Time MD responded: 1200

## 2016-05-28 NOTE — Progress Notes (Signed)
Pt refused morning labs.  

## 2016-05-28 NOTE — Progress Notes (Signed)
CRITICAL VALUE ALERT  Critical value received: Lactic acid 2.9  Date of notification: 05/28/16  Time of notification:  1815  Critical value read back:yes  Nurse who received alert: Latricia Heft RN   MD notified (1st page): [phone]  Time of first page: 1830  MD notified (2nd page):  Time of second page:  Responding MD: Dr Dia Crawford   Time MD responded: [phone]

## 2016-05-28 NOTE — Progress Notes (Addendum)
PROGRESS NOTE    Nathan Frank  [id number] DOB: 11/11/1940 DOA: 05/27/2016 PCP: Thressa Sheller, MD   Brief Narrative:  75 y.o. BM PMHx Depressive disorder, polysubstance abuse (Heroin overdose, Marijuana), EtOH abuse, liver cirrhosis, hepatitis C,NSTEMI, Atrial Fibrillation, Chronic Diastolic CHF, Prolonged Q-T interval ,Upper GI bleeding,DVT of lower limb  Present to the ER because of weakness and diarrhea. Patient states he has been feeling fatigued and weak over the last 2 days with constant diarrhea over the last 1 week. Denies any nausea vomiting. Has been having crampy abdominal pain. Denies any sick contacts or recent travel. Patient is tachycardic in the 130s. Patient states he has not taken any of his medications last 1 week. Was recently admitted last month for similar complaints with patient be also in A. fib with RVR. At that time patient was placed on amiodarone. Last dose taken as per the patient was a week ago. Patient was given fluid bolus in the ER and admitted for further management. Patient states his diarrhea was watery denies any blood in the diarrhea. Denies any chest pain or shortness of breath.   Subjective: 8/24 A/O 4, patient admits to not being compliant with medication. Never treated for hepatitis C though has been tried D into [place]:   Principal Problem:   Generalized weakness Active Problems:   Diarrhea   Alcohol withdrawal (HCC)   Hypokalemia   Dehydration   Persistent Diarrhea  - will check stool studies including C. difficile and GI pathogen panel. Check CT abdomen and pelvis. Continue with hydration. Have placed patient on ceftriaxone and Flagyl since patient has Abdominal pain. Check lactic acid levels. Alpha-fetoprotein, CA 19-9 pending  Generalized weakness probably from diarrhea and dehydration - closely observe. Check TSH and troponin.  Hypokalemia and hypomagnesemia probably from diarrhea - replace and  recheck.  Tachycardia with history of A. Fib - monitor and EKG showing sinus tachycardia. But we'll closely monitor for any conversion to A. fib or atrial flutter. At this time will keep patient on metoprolol if blood pressure allows. May need to restart amiodarone. Tachycardia could also be from alcohol withdrawal  Alcohol abuse  - states his last drink was 4 days ago. Patient is placed on CIWA protocol.  History of cirrhosis with history of hepatitis C  - has not been taking his Lasix and spironolactone last 1 week. Presently receiving IV fluids.  Thrombocytopenia and macrocytosis  -probably from alcoholism - closely follow CBC.  Paroxysmal A. Fib  - not on anticoagulants secondary to history of GI bleed. Chads 2 vasc score is 4. Have placed patient on metoprolol and will restart amiodarone after discussing with cardiologist.  Hypothyroidism -8/23 TSH= 29.9 -Start Synthroid IV 25 g daily     DVT prophylaxis: Lovenox Code Status: Full Family Communication: None Disposition Plan: ?   Consultants:  None  Procedures/Significant Events:  7/17 echocardiogram: LVEF = 55%-60%. -(grade 2 diastolic dysfunction). - Left atrium:  moderately dilated. - Right atrium: mildly dilated.- Tricuspid valve:  moderate regurgitation. - Pulmonary arteries:  PA  peak pressure: 33 mm Hg (S).   Cultures   Antimicrobials: Ceftriaxone 8/23>> Metronidazole 8/23>>   Devices    LINES / TUBES:      Continuous Infusions: . 0.9 % NaCl with KCl 20 mEq / L 75 mL/hr at 05/27/16 2247     Objective: Vitals:   05/28/16 0431 05/28/16 0700 05/28/16 0816 05/28/16 0901  BP:  (!) 75/63 95/76 (!) 78/68  Pulse:  (!) 115 (!) 115   Resp:  17 (!) 24   Temp:  97.7 F (36.5 C) 97.7 F (36.5 C)   TempSrc:  Oral Oral   SpO2:  95% 95%   Weight: 101 kg (222 lb 10.6 oz)     Height:        Intake/Output Summary (Last 24 hours) at 05/28/16 [id number] Last data filed at 05/28/16 [id number]  Gross per 24 hour    Intake           466.25 ml  Output               50 ml  Net           416.25 ml   Filed Weights   05/27/16 1258 05/27/16 2030 05/28/16 0431  Weight: 102.1 kg (225 lb) 97.9 kg (215 lb 13.3 oz) 101 kg (222 lb 10.6 oz)    Examination:  General: A/O 4, positive persistent diarrhea, No acute respiratory distress Eyes: negative scleral hemorrhage, negative anisocoria, negative icterus ENT: Negative Runny nose, negative gingival bleeding, Neck:  Negative scars, masses, torticollis, lymphadenopathy, JVD Lungs: Clear to auscultation bilaterally without wheezes or crackles Cardiovascular: Tachycardic, Regular rhythm without murmur gallop or rub normal S1 and S2 Abdomen: Positive RUQ/LUQ abdominal pain, positive enlarged liver, positive soft, bowel sounds, no rebound, no ascites, no appreciable mass Extremities: No significant cyanosis, clubbing, or edema bilateral lower extremities Skin: Negative rashes, lesions, ulcers Psychiatric:  Negative depression, negative anxiety, negative fatigue, negative mania Central nervous system:  Cranial nerves II through XII intact, tongue/uvula midline, all extremities muscle strength 5/5, sensation intact throughout,  negative dysarthria, negative expressive aphasia, negative receptive aphasia.  .     Data Reviewed: Care during the described time interval was provided by me .  I have reviewed this patient's available data, including medical history, events of note, physical examination, and all test results as part of my evaluation. I have personally reviewed and interpreted all radiology studies.  CBC:  Recent Labs Lab 05/27/16 1348  WBC 4.1  NEUTROABS 2.6  HGB 13.1  HCT 37.3*  MCV 101.1*  PLT 98*   Basic Metabolic Panel:  Recent Labs Lab 05/27/16 1348 05/27/16 2257  NA 138  --   K 2.8*  --   CL 107  --   CO2 24  --   GLUCOSE 114*  --   BUN <5*  --   CREATININE 0.89  --   CALCIUM 7.8*  --   MG  --  1.6*   GFR: Estimated  Creatinine Clearance: 88.2 mL/min (by C-G formula based on SCr of 0.89 mg/dL). Liver Function Tests:  Recent Labs Lab 05/27/16 1348  AST 147*  ALT 42  ALKPHOS 113  BILITOT 1.9*  PROT 5.9*  ALBUMIN 1.9*    Recent Labs Lab 05/27/16 1348  LIPASE 16   No results for input(s): AMMONIA in the last 168 hours. Coagulation Profile: No results for input(s): INR, PROTIME in the last 168 hours. Cardiac Enzymes:  Recent Labs Lab 05/27/16 2257  TROPONINI 0.03*   BNP (last 3 results) No results for input(s): PROBNP in the last 8760 hours. HbA1C: No results for input(s): HGBA1C in the last 72 hours. CBG: No results for input(s): GLUCAP in the last 168 hours. Lipid Profile: No results for input(s): CHOL, HDL, LDLCALC, TRIG, CHOLHDL, LDLDIRECT in the last 72 hours. Thyroid Function Tests:  Recent Labs  05/27/16 2257  TSH 29.993*   Anemia Panel: No results for  input(s): VITAMINB12, FOLATE, FERRITIN, TIBC, IRON, RETICCTPCT in the last 72 hours. Urine analysis:    Component Value Date/Time   COLORURINE ORANGE (A) 05/27/2016 1609   APPEARANCEUR CLOUDY (A) 05/27/2016 1609   LABSPEC 1.021 05/27/2016 1609   PHURINE 6.5 05/27/2016 1609   GLUCOSEU NEGATIVE 05/27/2016 1609   HGBUR NEGATIVE 05/27/2016 1609   BILIRUBINUR MODERATE (A) 05/27/2016 1609   KETONESUR 15 (A) 05/27/2016 1609   PROTEINUR 30 (A) 05/27/2016 1609   UROBILINOGEN 2.0 (H) 12/13/2014 1256   NITRITE POSITIVE (A) 05/27/2016 1609   LEUKOCYTESUR SMALL (A) 05/27/2016 1609   Sepsis Labs: @LABRCNTIP (procalcitonin:4,lacticidven:4)  ) Recent Results (from the past 240 hour(s))  MRSA PCR Screening     Status: None   Collection Time: 05/27/16  8:38 PM  Result Value Ref Range Status   MRSA by PCR NEGATIVE NEGATIVE Final    Comment:        The GeneXpert MRSA Assay (FDA approved for NASAL specimens only), is one component of a comprehensive MRSA colonization surveillance program. It is not intended to diagnose  MRSA infection nor to guide or monitor treatment for MRSA infections.   C difficile quick scan w PCR reflex     Status: None   Collection Time: 05/28/16  2:40 AM  Result Value Ref Range Status   C Diff antigen NEGATIVE NEGATIVE Final   C Diff toxin NEGATIVE NEGATIVE Final   C Diff interpretation No C. difficile detected.  Final         Radiology Studies: Ct Abdomen Pelvis W Contrast  Result Date: 05/28/2016 CLINICAL DATA:  Abdominal pain.  Diarrhea for 1 week. EXAM: CT ABDOMEN AND PELVIS WITH CONTRAST TECHNIQUE: Multidetector CT imaging of the abdomen and pelvis was performed using the standard protocol following bolus administration of intravenous contrast. CONTRAST:  138mL ISOVUE-300 IOPAMIDOL (ISOVUE-300) INJECTION 61% COMPARISON:  CT 04/18/2016 FINDINGS: Lower chest: Small pleural effusions, lost slightly larger than on prior CT. Adjacent atelectasis in the lower lobes. Liver: Diffusely decreased density consistent with steatosis. Liver parenchyma is diffusely heterogeneous. Probable fibrosis linear bands. Nodular hepatic contours consistent with cirrhosis. Small amount of perihepatic fluid. Hepatobiliary: Multiple gallstones within physiologically distended gallbladder. Prominent common bile duct measuring 8-9 mm distally. No calcified choledocholithiasis. Pancreas: Parenchymal atrophy. No ductal dilatation or inflammation. Spleen: Normal.  No splenomegaly. Adrenal glands: No nodule. Kidneys: Symmetric renal enhancement. No hydronephrosis. Listen about delayed excretion on delayed phase imaging. Stomach/Bowel: Paraesophageal and mild perigastric varices. Stomach physiologically distended. There are no dilated or thickened small bowel loops. Wall thickening of the cecum and proximal ascending colon without surrounding fat stranding. Stool contrast throughout the remainder the colon. The appendix is normal. Vascular/Lymphatic: Prominent porta hepatis nodes. No retroperitoneal adenopathy.  Abdominal aorta is normal in caliber. Atherosclerosis of the abdominal aorta without aneurysm. Reproductive: No acute abnormality. Bladder: Minimally distended without wall thickening. Other: Small volume intra-abdominal ascites tracking in the pelvis. No free air. No intra-abdominal abscess. Mild whole body wall edema, increased from prior CT. Musculoskeletal: There are no acute or suspicious osseous abnormalities. Degenerative change in the spine, stable. IMPRESSION: 1. Colonic wall thickening involving the cecum and proximal ascending colon. This may be colitis, infectious or inflammatory, however given cirrhosis, portal enteropathy could have this appearance. 2. Advanced cirrhosis with significant fatty infiltration of the liver. Peri-gastric and paraesophageal varices. Small volume of ascites. 3. Cholelithiasis without signs of gallbladder inflammation. 4. Small pleural effusions. Electronically Signed   By: Jeb Levering M.D.   On: 05/28/2016 02:53  Scheduled Meds: . aspirin EC  81 mg Oral Daily  . cefTRIAXone (ROCEPHIN)  IV  2 g Intravenous Q24H  . folic acid  1 mg Oral Daily  . LORazepam  0-4 mg Oral Q6H   Followed by  . [START ON 05/30/2016] LORazepam  0-4 mg Oral Q12H  . metoprolol tartrate  12.5 mg Oral BID  . metronidazole  500 mg Intravenous Q8H  . multivitamin with minerals  1 tablet Oral Daily  . pantoprazole  40 mg Oral Daily  . sucralfate  1 g Oral TID WC & HS  . thiamine  100 mg Oral Daily  . traMADol  50 mg Oral TID  . traZODone  100 mg Oral QHS   Continuous Infusions: . 0.9 % NaCl with KCl 20 mEq / L 75 mL/hr at 05/27/16 2247     LOS: 0 days    Time spent: 40 minutes    Taneesha Edgin, Geraldo Docker, MD [facility] Pager [phone]   If 7PM-7AM, please contact night-coverage [email or web address] Password [facility] 05/28/2016, 9:21 AM

## 2016-05-29 ENCOUNTER — Encounter (HOSPITAL_COMMUNITY): Payer: Self-pay | Admitting: Internal Medicine

## 2016-05-29 DIAGNOSIS — R000 Tachycardia, unspecified: Secondary | ICD-10-CM

## 2016-05-29 DIAGNOSIS — C220 Liver cell carcinoma: Secondary | ICD-10-CM | POA: Insufficient documentation

## 2016-05-29 LAB — COMPREHENSIVE METABOLIC PANEL
ALBUMIN: 1.6 g/dL — AB (ref 3.5–5.0)
ALT: 34 U/L (ref 17–63)
ANION GAP: 7 (ref 5–15)
AST: 116 U/L — ABNORMAL HIGH (ref 15–41)
Alkaline Phosphatase: 103 U/L (ref 38–126)
BILIRUBIN TOTAL: 0.5 mg/dL (ref 0.3–1.2)
CALCIUM: 7 mg/dL — AB (ref 8.9–10.3)
CO2: 17 mmol/L — ABNORMAL LOW (ref 22–32)
CREATININE: 0.82 mg/dL (ref 0.61–1.24)
Chloride: 114 mmol/L — ABNORMAL HIGH (ref 101–111)
GFR calc non Af Amer: >60 mL/min (ref >60)
GLUCOSE: 89 mg/dL (ref 65–99)
POTASSIUM: 4.7 mmol/L (ref 3.5–5.1)
Sodium: 138 mmol/L (ref 135–145)
TOTAL PROTEIN: 5.2 g/dL — AB (ref 6.5–8.1)

## 2016-05-29 LAB — CBC
HCT: 39.1 % (ref 39.0–52.0)
HEMOGLOBIN: 12.7 g/dL — AB (ref 13.0–17.0)
MCH: 35 pg — AB (ref 26.0–34.0)
MCHC: 32.5 g/dL (ref 30.0–36.0)
MCV: 107.7 fL — ABNORMAL HIGH (ref 78.0–100.0)
Platelets: 84 10*3/uL — ABNORMAL LOW (ref 150–400)
RBC: 3.63 MIL/uL — AB (ref 4.22–5.81)
RDW: 18.4 % — ABNORMAL HIGH (ref 11.5–15.5)
WBC: 3.6 10*3/uL — ABNORMAL LOW (ref 4.0–10.5)

## 2016-05-29 LAB — PROTIME-INR
INR: 1.76
PROTHROMBIN TIME: 20.7 s — AB (ref 11.4–15.2)

## 2016-05-29 LAB — CBC WITH DIFFERENTIAL/PLATELET
BASOS PCT: 0 %
Basophils Absolute: 0 10*3/uL (ref 0.0–0.1)
EOS ABS: 0.1 10*3/uL (ref 0.0–0.7)
Eosinophils Relative: 2 %
HEMATOCRIT: 37.5 % — AB (ref 39.0–52.0)
Hemoglobin: 12.5 g/dL — ABNORMAL LOW (ref 13.0–17.0)
Lymphocytes Relative: 31 %
Lymphs Abs: 0.9 10*3/uL (ref 0.7–4.0)
MCH: 35.6 pg — AB (ref 26.0–34.0)
MCHC: 33.3 g/dL (ref 30.0–36.0)
MCV: 106.8 fL — ABNORMAL HIGH (ref 78.0–100.0)
MONO ABS: 0.4 10*3/uL (ref 0.1–1.0)
MONOS PCT: 13 %
NEUTROS ABS: 1.5 10*3/uL — AB (ref 1.7–7.7)
Neutrophils Relative %: 54 %
Platelets: 9 10*3/uL — CL (ref 150–400)
RBC: 3.51 MIL/uL — ABNORMAL LOW (ref 4.22–5.81)
RDW: 18.6 % — AB (ref 11.5–15.5)
WBC: 2.8 10*3/uL — ABNORMAL LOW (ref 4.0–10.5)

## 2016-05-29 LAB — HEPATITIS PANEL, ACUTE
HEP A IGM: NEGATIVE
HEP B C IGM: NEGATIVE
Hepatitis B Surface Ag: NEGATIVE

## 2016-05-29 LAB — LACTIC ACID, PLASMA: LACTIC ACID, VENOUS: 1.9 mmol/L (ref 0.5–1.9)

## 2016-05-29 LAB — MAGNESIUM: Magnesium: 1.6 mg/dL — ABNORMAL LOW (ref 1.7–2.4)

## 2016-05-29 LAB — HCV RNA QUANT
HCV Quantitative Log: 6.408 log10 IU/mL (ref >1.70)
HCV Quantitative: 2560000 IU/mL (ref >50)

## 2016-05-29 LAB — LIPASE, BLOOD: Lipase: 18 U/L (ref 11–51)

## 2016-05-29 LAB — AMMONIA: Ammonia: 102 umol/L — ABNORMAL HIGH (ref 9–35)

## 2016-05-29 MED ORDER — MAGNESIUM SULFATE 2 GM/50ML IV SOLN
2.0000 g | Freq: Once | INTRAVENOUS | Status: AC
  Administered 2016-05-29: 2 g via INTRAVENOUS
  Filled 2016-05-29: qty 50

## 2016-05-29 MED ORDER — SODIUM CHLORIDE 0.9 % IV SOLN
INTRAVENOUS | Status: DC
  Administered 2016-05-29 – 2016-06-01 (×4): via INTRAVENOUS

## 2016-05-29 NOTE — Care Management Note (Signed)
Case Management Note  Patient Details  Name: Nathan Frank MRN: [id number] Date of Birth: 1941/05/10  Subjective/Objective: Pt presented for weakness, tachycardia and diarrhea. Pt is from home with wife. Pt has a hx of Afib,  ETOH abuse. Initiated on CIWA Protocol.  Pt continues on IVF and IV  Flagyl and Rocephin.             Action/Plan: CM will continue to monitor for additional needs. CM did make CSW aware of ETOH abuse and that he may benefit from resources.   Expected Discharge Date:                  Expected Discharge Plan:  [place]  In-House Referral:  Clinical Social Work  Discharge planning Services  CM Consult  Post Acute Care Choice:    Choice offered to:     DME Arranged:    DME Agency:     HH Arranged:    [place] Agency:     Status of Service:  In process, will continue to follow  If discussed at Long Length of Stay Meetings, dates discussed:    Additional Comments:  Bethena Roys, RN 05/29/2016, 3:22 PM

## 2016-05-29 NOTE — Progress Notes (Addendum)
PROGRESS NOTE    Nathan Frank  [id number] DOB: 1941-02-07 DOA: 05/27/2016 PCP: Thressa Sheller, MD   Brief Narrative:  75 y.o. BM PMHx Depressive disorder, polysubstance abuse (Heroin overdose, Marijuana), EtOH abuse, liver cirrhosis, hepatitis C,NSTEMI, Atrial Fibrillation, Chronic Diastolic CHF, Prolonged Q-T interval ,Upper GI bleeding,DVT of lower limb  Present to the ER because of weakness and diarrhea. Patient states he has been feeling fatigued and weak over the last 2 days with constant diarrhea over the last 1 week. Denies any nausea vomiting. Has been having crampy abdominal pain. Denies any sick contacts or recent travel. Patient is tachycardic in the 130s. Patient states he has not taken any of his medications last 1 week. Was recently admitted last month for similar complaints with patient be also in A. fib with RVR. At that time patient was placed on amiodarone. Last dose taken as per the patient was a week ago. Patient was given fluid bolus in the ER and admitted for further management. Patient states his diarrhea was watery denies any blood in the diarrhea. Denies any chest pain or shortness of breath.   Subjective: 8/25 A/O 4, patient admits to not being compliant with medication. Never treated for hepatitis C though has been tried D into [place]:   Principal Problem:   Generalized weakness Active Problems:   Diarrhea   Alcohol withdrawal (HCC)   Hypokalemia   Dehydration   Hepatocellular carcinoma (HCC)   Paroxysmal atrial fibrillation (HCC)   Persistent Diarrhea  - Negative C. difficile and GI pathogen panel. -CT abdomen and pelvis; liver cirrhosis, thickening of cecum and ascending colon see results below -Continue current antibiotics -Continue with hydration. -Alpha-fetoprotein, CA 19-9 pending -Chronic diarrhea labs pending -Requested PICC line placement secondary to chronic illness and requirement for extensive IV  medications and lab checks  Generalized weakness  -probably from diarrhea and dehydration - closely observe. .  Hypokalemia -Potassium goal> 4  Hypomagnesemia -Magnesium goal> 2 -Magnesium IV 2 g .  Tachycardia with history of A. Fib  - monitor and EKG showing sinus tachycardia. But we'll closely monitor for any conversion to A. fib or atrial flutter. At this time will keep patient on metoprolol if blood pressure allows. May need to restart amiodarone. Tachycardia could also be from alcohol withdrawal  Paroxysmal A. Fib( Chads 2 vasc score is 4). - not on anticoagulants secondary to history of GI bleed.  Have placed patient on metoprolol and will restart amiodarone after discussing with cardiologist.  Alcohol abuse  - states his last drink was 4 days ago. Patient is placed on CIWA protocol.  Hepatitis C  - has not been taking his Lasix and spironolactone last 1 week. Presently receiving IV fluids.  Thrombocytopenia and macrocytosis  -probably from alcoholism - closely follow CBC.  Hypothyroidism -8/23 TSH= 29.9 -Start Synthroid IV 25 g daily     DVT prophylaxis: Lovenox Code Status: Full Family Communication: None Disposition Plan: ?   Consultants:  None  Procedures/Significant Events:  7/17 echocardiogram: LVEF = 55%-60%. -(grade 2 diastolic dysfunction). - Left atrium:  moderately dilated. - Right atrium: mildly dilated.- Tricuspid valve:  moderate regurgitation. - Pulmonary arteries:  PA  peak pressure: 33 mm Hg (S). 8/24 CT abdomen pelvis:. -Colonic wall thickening involving the cecum and proximal ascending colon. Infectious colitis vs  Inflammatory, vs portal enteropathy. -Advanced cirrhosis with significant fatty infiltration liver.  -Peri-gastric and paraesophageal varices.  -Cholelithiasis without signs of gallbladder inflammation.  Cultures 8/23 MRSA by PCR negative 8/24 blood 2 pending 8/24 C. difficile negative 8/24 GI panel negative 8/24  positive Hepatitis C virus 2,560,000 copies 8/24 negative hepatitis a, negative hepatitis B 8/24 HIV 1/2 negative   Antimicrobials: Ceftriaxone 8/23>> Metronidazole 8/23>>   Devices    LINES / TUBES:      Continuous Infusions: . sodium chloride 125 mL/hr at 05/29/16 1200     Objective: Vitals:   05/29/16 0515 05/29/16 0700 05/29/16 0754 05/29/16 1200  BP: 93/76 104/85  94/65  Pulse:  (!) 108  (!) 109  Resp:  12  14  Temp:   97.7 F (36.5 C) 97.6 F (36.4 C)  TempSrc:   Oral Axillary  SpO2:  100%  100%  Weight:      Height:        Intake/Output Summary (Last 24 hours) at 05/29/16 1340 Last data filed at 05/29/16 1200  Gross per 24 hour  Intake          3810.41 ml  Output              300 ml  Net          3510.41 ml   Filed Weights   05/27/16 2030 05/28/16 0431 05/29/16 0500  Weight: 97.9 kg (215 lb 13.3 oz) 101 kg (222 lb 10.6 oz) 105.3 kg (232 lb 2.3 oz)    Examination:  General: A/O 4, positive persistent diarrhea, No acute respiratory distress Eyes: negative scleral hemorrhage, negative anisocoria, negative icterus ENT: Negative Runny nose, negative gingival bleeding, Neck:  Negative scars, masses, torticollis, lymphadenopathy, JVD Lungs: Clear to auscultation bilaterally without wheezes or crackles Cardiovascular: Tachycardic, Regular rhythm without murmur gallop or rub normal S1 and S2 Abdomen: Positive RUQ/LUQ abdominal pain, positive enlarged liver, positive soft, bowel sounds, no rebound, no ascites, no appreciable mass Extremities: No significant cyanosis, clubbing, or edema bilateral lower extremities Skin: Negative rashes, lesions, ulcers Psychiatric:  Negative depression, negative anxiety, negative fatigue, negative mania Central nervous system:  Cranial nerves II through XII intact, tongue/uvula midline, all extremities muscle strength 5/5, sensation intact throughout,  negative dysarthria, negative expressive aphasia, negative receptive  aphasia.  .     Data Reviewed: Care during the described time interval was provided by me .  I have reviewed this patient's available data, including medical history, events of note, physical examination, and all test results as part of my evaluation. I have personally reviewed and interpreted all radiology studies.  CBC:  Recent Labs Lab 05/27/16 1348 05/28/16 0953 05/29/16 0851  WBC 4.1 3.6* 2.8*  NEUTROABS 2.6 2.1 1.5*  HGB 13.1 13.0 12.5*  HCT 37.3* 38.1* 37.5*  MCV 101.1* 104.1* 106.8*  PLT 98* 83* 9*   Basic Metabolic Panel:  Recent Labs Lab 05/27/16 1348 05/27/16 2257 05/28/16 0953 05/29/16 0851  NA 138  --  138 138  K 2.8*  --  3.8 4.7  CL 107  --  112* 114*  CO2 24  --  22 17*  GLUCOSE 114*  --  122* 89  BUN <5*  --  <5* <5*  CREATININE 0.89  --  0.85 0.82  CALCIUM 7.8*  --  7.1* 7.0*  MG  --  1.6* 1.9  --   PHOS  --   --  2.8  --    GFR: Estimated Creatinine Clearance: 97.7 mL/min (by C-G formula based on SCr of 0.82 mg/dL). Liver Function Tests:  Recent Labs Lab 05/27/16 1348 05/28/16 [id number] 05/29/16 [id number]  AST 147* 126* 116*  ALT 42 36 34  ALKPHOS 113 97 103  BILITOT 1.9* 1.8* 0.5  PROT 5.9* 5.8* 5.2*  ALBUMIN 1.9* 1.9* 1.6*    Recent Labs Lab 05/27/16 1348 05/28/16 0953 05/29/16 0851  LIPASE 16 19 18     Recent Labs Lab 05/29/16 0643  AMMONIA 102*   Coagulation Profile:  Recent Labs Lab 05/29/16 0643  INR 1.76   Cardiac Enzymes:  Recent Labs Lab 05/27/16 2257 05/28/16 0953  TROPONINI 0.03* 0.03*   BNP (last 3 results) No results for input(s): PROBNP in the last 8760 hours. HbA1C: No results for input(s): HGBA1C in the last 72 hours. CBG: No results for input(s): GLUCAP in the last 168 hours. Lipid Profile: No results for input(s): CHOL, HDL, LDLCALC, TRIG, CHOLHDL, LDLDIRECT in the last 72 hours. Thyroid Function Tests:  Recent Labs  05/27/16 2257  TSH 29.993*   Anemia Panel: No results for input(s):  VITAMINB12, FOLATE, FERRITIN, TIBC, IRON, RETICCTPCT in the last 72 hours. Urine analysis:    Component Value Date/Time   COLORURINE ORANGE (A) 05/27/2016 1609   APPEARANCEUR CLOUDY (A) 05/27/2016 1609   LABSPEC 1.021 05/27/2016 1609   PHURINE 6.5 05/27/2016 1609   GLUCOSEU NEGATIVE 05/27/2016 1609   HGBUR NEGATIVE 05/27/2016 1609   BILIRUBINUR MODERATE (A) 05/27/2016 1609   KETONESUR 15 (A) 05/27/2016 1609   PROTEINUR 30 (A) 05/27/2016 1609   UROBILINOGEN 2.0 (H) 12/13/2014 1256   NITRITE POSITIVE (A) 05/27/2016 1609   LEUKOCYTESUR SMALL (A) 05/27/2016 1609   Sepsis Labs: @LABRCNTIP (procalcitonin:4,lacticidven:4)  ) Recent Results (from the past 240 hour(s))  MRSA PCR Screening     Status: None   Collection Time: 05/27/16  8:38 PM  Result Value Ref Range Status   MRSA by PCR NEGATIVE NEGATIVE Final    Comment:        The GeneXpert MRSA Assay (FDA approved for NASAL specimens only), is one component of a comprehensive MRSA colonization surveillance program. It is not intended to diagnose MRSA infection nor to guide or monitor treatment for MRSA infections.   Gastrointestinal Panel by PCR , Stool     Status: None   Collection Time: 05/28/16  2:40 AM  Result Value Ref Range Status   Campylobacter species NOT DETECTED NOT DETECTED Final   Plesimonas shigelloides NOT DETECTED NOT DETECTED Final   Salmonella species NOT DETECTED NOT DETECTED Final   Yersinia enterocolitica NOT DETECTED NOT DETECTED Final   Vibrio species NOT DETECTED NOT DETECTED Final   Vibrio cholerae NOT DETECTED NOT DETECTED Final   Enteroaggregative E coli (EAEC) NOT DETECTED NOT DETECTED Final   Enteropathogenic E coli (EPEC) NOT DETECTED NOT DETECTED Final   Enterotoxigenic E coli (ETEC) NOT DETECTED NOT DETECTED Final   Shiga like toxin producing E coli (STEC) NOT DETECTED NOT DETECTED Final   E. coli O157 NOT DETECTED NOT DETECTED Final   Shigella/Enteroinvasive E coli (EIEC) NOT DETECTED NOT  DETECTED Final   Cryptosporidium NOT DETECTED NOT DETECTED Final   Cyclospora cayetanensis NOT DETECTED NOT DETECTED Final   Entamoeba histolytica NOT DETECTED NOT DETECTED Final   Giardia lamblia NOT DETECTED NOT DETECTED Final   Adenovirus F40/41 NOT DETECTED NOT DETECTED Final   Astrovirus NOT DETECTED NOT DETECTED Final   Norovirus GI/GII NOT DETECTED NOT DETECTED Final   Rotavirus A NOT DETECTED NOT DETECTED Final   Sapovirus (I, II, IV, and V) NOT DETECTED NOT DETECTED Final  C difficile quick scan w PCR reflex  Status: None   Collection Time: 05/28/16  2:40 AM  Result Value Ref Range Status   C Diff antigen NEGATIVE NEGATIVE Final   C Diff toxin NEGATIVE NEGATIVE Final   C Diff interpretation No C. difficile detected.  Final         Radiology Studies: Ct Abdomen Pelvis W Contrast  Result Date: 05/28/2016 CLINICAL DATA:  Abdominal pain.  Diarrhea for 1 week. EXAM: CT ABDOMEN AND PELVIS WITH CONTRAST TECHNIQUE: Multidetector CT imaging of the abdomen and pelvis was performed using the standard protocol following bolus administration of intravenous contrast. CONTRAST:  161mL ISOVUE-300 IOPAMIDOL (ISOVUE-300) INJECTION 61% COMPARISON:  CT 04/18/2016 FINDINGS: Lower chest: Small pleural effusions, lost slightly larger than on prior CT. Adjacent atelectasis in the lower lobes. Liver: Diffusely decreased density consistent with steatosis. Liver parenchyma is diffusely heterogeneous. Probable fibrosis linear bands. Nodular hepatic contours consistent with cirrhosis. Small amount of perihepatic fluid. Hepatobiliary: Multiple gallstones within physiologically distended gallbladder. Prominent common bile duct measuring 8-9 mm distally. No calcified choledocholithiasis. Pancreas: Parenchymal atrophy. No ductal dilatation or inflammation. Spleen: Normal.  No splenomegaly. Adrenal glands: No nodule. Kidneys: Symmetric renal enhancement. No hydronephrosis. Listen about delayed excretion on  delayed phase imaging. Stomach/Bowel: Paraesophageal and mild perigastric varices. Stomach physiologically distended. There are no dilated or thickened small bowel loops. Wall thickening of the cecum and proximal ascending colon without surrounding fat stranding. Stool contrast throughout the remainder the colon. The appendix is normal. Vascular/Lymphatic: Prominent porta hepatis nodes. No retroperitoneal adenopathy. Abdominal aorta is normal in caliber. Atherosclerosis of the abdominal aorta without aneurysm. Reproductive: No acute abnormality. Bladder: Minimally distended without wall thickening. Other: Small volume intra-abdominal ascites tracking in the pelvis. No free air. No intra-abdominal abscess. Mild whole body wall edema, increased from prior CT. Musculoskeletal: There are no acute or suspicious osseous abnormalities. Degenerative change in the spine, stable. IMPRESSION: 1. Colonic wall thickening involving the cecum and proximal ascending colon. This may be colitis, infectious or inflammatory, however given cirrhosis, portal enteropathy could have this appearance. 2. Advanced cirrhosis with significant fatty infiltration of the liver. Peri-gastric and paraesophageal varices. Small volume of ascites. 3. Cholelithiasis without signs of gallbladder inflammation. 4. Small pleural effusions. Electronically Signed   By: Jeb Levering M.D.   On: 05/28/2016 02:53        Scheduled Meds: . aspirin EC  81 mg Oral Daily  . cefTRIAXone (ROCEPHIN)  IV  2 g Intravenous Q24H  . enoxaparin (LOVENOX) injection  40 mg Subcutaneous Q24H  . folic acid  1 mg Oral Daily  . levothyroxine  25 mcg Intravenous Daily  . LORazepam  0-4 mg Oral Q6H   Followed by  . [START ON 05/30/2016] LORazepam  0-4 mg Oral Q12H  . metoprolol tartrate  12.5 mg Oral BID  . metronidazole  500 mg Intravenous Q8H  . multivitamin with minerals  1 tablet Oral Daily  . pantoprazole  40 mg Oral Daily  . sucralfate  1 g Oral TID WC &  HS  . thiamine  100 mg Oral Daily  . traMADol  50 mg Oral TID  . traZODone  100 mg Oral QHS   Continuous Infusions: . sodium chloride 125 mL/hr at 05/29/16 1200     LOS: 1 day    Time spent: 40 minutes    Archit Leger, Geraldo Docker, MD [facility] Pager [phone]   If 7PM-7AM, please contact night-coverage [email or web address] Password [email or web address] 05/29/2016, 1:40 PM

## 2016-05-30 ENCOUNTER — Inpatient Hospital Stay (HOSPITAL_COMMUNITY): Payer: Medicare Other

## 2016-05-30 ENCOUNTER — Other Ambulatory Visit (HOSPITAL_COMMUNITY): Payer: Self-pay | Admitting: Radiology

## 2016-05-30 DIAGNOSIS — E038 Other specified hypothyroidism: Secondary | ICD-10-CM

## 2016-05-30 LAB — CBC WITH DIFFERENTIAL/PLATELET
BASOS PCT: 0 %
Basophils Absolute: 0 10*3/uL (ref 0.0–0.1)
EOS ABS: 0.3 10*3/uL (ref 0.0–0.7)
EOS PCT: 6 %
HCT: 35.3 % — ABNORMAL LOW (ref 39.0–52.0)
Hemoglobin: 12.2 g/dL — ABNORMAL LOW (ref 13.0–17.0)
LYMPHS ABS: 0.2 10*3/uL — AB (ref 0.7–4.0)
Lymphocytes Relative: 4 %
MCH: 37 pg — AB (ref 26.0–34.0)
MCHC: 34.6 g/dL (ref 30.0–36.0)
MCV: 107 fL — ABNORMAL HIGH (ref 78.0–100.0)
MONO ABS: 0.9 10*3/uL (ref 0.1–1.0)
Monocytes Relative: 20 %
NEUTROS ABS: 3.1 10*3/uL (ref 1.7–7.7)
Neutrophils Relative %: 70 %
PLATELETS: 137 10*3/uL — AB (ref 150–400)
RBC: 3.3 MIL/uL — ABNORMAL LOW (ref 4.22–5.81)
RDW: 20 % — AB (ref 11.5–15.5)
WBC: 4.5 10*3/uL (ref 4.0–10.5)

## 2016-05-30 LAB — AFP TUMOR MARKER: AFP TUMOR MARKER: 3.2 ng/mL (ref 0.0–8.3)

## 2016-05-30 LAB — MAGNESIUM
MAGNESIUM: 2 mg/dL (ref 1.7–2.4)
MAGNESIUM: 1.7 mg/dL (ref 1.7–2.4)

## 2016-05-30 LAB — COMPREHENSIVE METABOLIC PANEL
ALT: 33 U/L (ref 17–63)
ANION GAP: 4 — AB (ref 5–15)
AST: 118 U/L — ABNORMAL HIGH (ref 15–41)
Albumin: 1.6 g/dL — ABNORMAL LOW (ref 3.5–5.0)
Alkaline Phosphatase: 100 U/L (ref 38–126)
BUN: <5 mg/dL — ABNORMAL LOW (ref 6–20)
CHLORIDE: 118 mmol/L — AB (ref 101–111)
CO2: 17 mmol/L — ABNORMAL LOW (ref 22–32)
CREATININE: 0.79 mg/dL (ref 0.61–1.24)
Calcium: 7.1 mg/dL — ABNORMAL LOW (ref 8.9–10.3)
Glucose, Bld: 99 mg/dL (ref 65–99)
POTASSIUM: 5 mmol/L (ref 3.5–5.1)
SODIUM: 139 mmol/L (ref 135–145)
Total Bilirubin: 1.4 mg/dL — ABNORMAL HIGH (ref 0.3–1.2)
Total Protein: 5.3 g/dL — ABNORMAL LOW (ref 6.5–8.1)

## 2016-05-30 LAB — BASIC METABOLIC PANEL
ANION GAP: 0 — AB (ref 5–15)
BUN: <5 mg/dL — ABNORMAL LOW (ref 6–20)
CALCIUM: 6.6 mg/dL — AB (ref 8.9–10.3)
CHLORIDE: 111 mmol/L (ref 101–111)
CO2: 28 mmol/L (ref 22–32)
CREATININE: 0.79 mg/dL (ref 0.61–1.24)
GFR calc non Af Amer: >60 mL/min (ref >60)
Glucose, Bld: 99 mg/dL (ref 65–99)
Potassium: 3.9 mmol/L (ref 3.5–5.1)
SODIUM: 139 mmol/L (ref 135–145)

## 2016-05-30 LAB — CANCER ANTIGEN 19-9: CA 19-9: 45 U/mL — ABNORMAL HIGH (ref 0–35)

## 2016-05-30 MED ORDER — STERILE WATER FOR INJECTION IV SOLN
Freq: Once | INTRAVENOUS | Status: AC
  Administered 2016-05-30: 09:00:00 via INTRAVENOUS
  Filled 2016-05-30: qty 850

## 2016-05-30 MED ORDER — GADOBENATE DIMEGLUMINE 529 MG/ML IV SOLN
20.0000 mL | Freq: Once | INTRAVENOUS | Status: AC | PRN
  Administered 2016-05-30: 20 mL via INTRAVENOUS

## 2016-05-30 MED ORDER — METRONIDAZOLE 500 MG PO TABS
500.0000 mg | ORAL_TABLET | Freq: Three times a day (TID) | ORAL | Status: AC
Start: 2016-05-30 — End: 2016-06-01
  Administered 2016-05-30 – 2016-06-01 (×8): 500 mg via ORAL
  Filled 2016-05-30 (×8): qty 1

## 2016-05-30 MED ORDER — METRONIDAZOLE IN NACL 5-0.79 MG/ML-% IV SOLN: 500.0000 mg | Freq: Three times a day (TID) | INTRAVENOUS | Status: DC

## 2016-05-30 NOTE — Progress Notes (Signed)
Peripherally Inserted Central Catheter/Midline Placement  The IV Nurse has discussed with the patient and/or persons authorized to consent for the patient, the purpose of this procedure and the potential benefits and risks involved with this procedure.  The benefits include less needle sticks, lab draws from the catheter, ability to perform PICC exchange if ordered by the physician and patient may be discharged home with the catheter.  Risks include, but not limited to, infection, bleeding, blood clot (thrombus formation), and puncture of an artery; nerve damage and irregular heart beat.  Alternatives to this procedure were also discussed.  Bard educational information left with pt.  PICC/Midline Placement Documentation  PICC Double Lumen 05/30/16 PICC Right Brachial 38 cm 0 cm (Active)  Indication for Insertion or Continuance of Line Limited venous access - need for IV therapy >5 days (PICC only);Poor Vasculature-patient has had multiple peripheral attempts or PIVs lasting less than 24 hours 05/30/2016 10:23 AM  Exposed Catheter (cm) 0 cm 05/30/2016 10:23 AM  Site Assessment Clean;Dry;Intact 05/30/2016 10:23 AM  Lumen #1 Status Flushed;Saline locked;Blood return noted 05/30/2016 10:23 AM  Lumen #2 Status Flushed;Saline locked;Blood return noted 05/30/2016 10:23 AM  Dressing Type Transparent 05/30/2016 10:23 AM  Dressing Status Clean;Intact;Dry;Antimicrobial disc in place 05/30/2016 10:23 AM  Line Care Connections checked and tightened 05/30/2016 10:23 AM  Line Adjustment (NICU/IV Team Only) No 05/30/2016 10:23 AM  Dressing Intervention New dressing 05/30/2016 10:23 AM  Dressing Change Due 06/06/16 05/30/2016 10:23 AM       Rolena Infante 05/30/2016, 10:24 AM

## 2016-05-30 NOTE — Progress Notes (Signed)
PROGRESS NOTE    Nathan Frank  [id number] DOB: 1941-07-12 DOA: 05/27/2016 PCP: Thressa Sheller, MD   Brief Narrative:  75 y.o. BM PMHx Depressive disorder, polysubstance abuse (Heroin overdose, Marijuana), EtOH abuse, liver cirrhosis, hepatitis C,NSTEMI, Atrial Fibrillation, Chronic Diastolic CHF, Prolonged Q-T interval ,Upper GI bleeding,DVT of lower limb  Present to the ER because of weakness and diarrhea. Patient states he has been feeling fatigued and weak over the last 2 days with constant diarrhea over the last 1 week. Denies any nausea vomiting. Has been having crampy abdominal pain. Denies any sick contacts or recent travel. Patient is tachycardic in the 130s. Patient states he has not taken any of his medications last 1 week. Was recently admitted last month for similar complaints with patient be also in A. fib with RVR. At that time patient was placed on amiodarone. Last dose taken as per the patient was a week ago. Patient was given fluid bolus in the ER and admitted for further management. Patient states his diarrhea was watery denies any blood in the diarrhea. Denies any chest pain or shortness of breath.   Subjective: 8/26 A/O 4, patient admits to not being compliant with medication. Never treated for hepatitis C though has been tried D into [place]:   Principal Problem:   Generalized weakness Active Problems:   Diarrhea   Alcohol withdrawal (HCC)   Hypokalemia   Dehydration   Hepatocellular carcinoma (HCC)   Paroxysmal atrial fibrillation (HCC)   Other specified hypothyroidism   Persistent Diarrhea  - Negative C. difficile and GI pathogen panel. -CT abdomen and pelvis; liver cirrhosis, thickening of cecum and ascending colon see results below -Continue current antibiotics -Continue with hydration. -Alpha-fetoprotein, normal -CA 19-9 +45 -Chronic diarrhea labs pending -Requested PICC line placement secondary to chronic illness  and requirement for extensive IV medications and lab checks -After speaking with Dr. Fuller Plan from [place] felt it may be beneficial to at least obtain abdominal MRI to evaluate pancreas further. Unless significant finding or worsening symptoms, could complete GI work out as outpatient  Alcohol abuse  - states his last drink was 4 days ago. Patient is placed on CIWA protocol.  Hepatitis C  - has not been taking his Lasix and spironolactone last 1 week. Presently receiving IV fluids. -8/26 spoke with Dr. Bobby Rumpf ID states follow-up after patient discharged as outpatient  EtOH liver cirrhosis -8/25 NH4= 102: Asymptomatic  Generalized weakness  -probably from diarrhea and dehydration - closely observe. .  Hypokalemia -Potassium goal> 4  Hypomagnesemia -Magnesium goal> 2 -Magnesium IV 2 g .  Hypocalcemia -Corrected calcium= 9.1  Tachycardia with history of A. Fib secondary to EtOH withdrawal? -Metoprolol 12.5 mg BID  Paroxysmal A. Fib( Chads 2 vasc score is 4). - not on anticoagulants secondary to history of GI bleed.  Have placed patient on metoprolol and will restart amiodarone after discussing with cardiologist.  Chronic diastolic CHF/dilated cardiomyopathy -Strict I&O -Daily weight  Thrombocytopenia and macrocytosis  -probably from alcoholism - closely follow CBC.  Hypothyroidism -8/23 TSH= 29.9 -Start Synthroid IV 25 g daily     DVT prophylaxis: Lovenox Code Status: Full Family Communication: None Disposition Plan: ?   Consultants:  Dr. Fuller Plan GI phone consult   Procedures/Significant Events:  7/17 echocardiogram: LVEF = 55%-60%. -(grade 2 diastolic dysfunction). - Left atrium:  moderately dilated. - Right atrium: mildly dilated.- Tricuspid valve:  moderate regurgitation. - Pulmonary arteries:  PA  peak pressure: 33 mm  Hg (S). 8/24 CT abdomen pelvis:. -Colonic wall thickening involving the cecum and proximal ascending colon. Infectious colitis vs   Inflammatory, vs portal enteropathy. -Advanced cirrhosis with significant fatty infiltration liver.  -Peri-gastric and paraesophageal varices.  -Cholelithiasis without signs of gallbladder inflammation.     Cultures 8/23 MRSA by PCR negative 8/24 blood 2 pending 8/24 C. difficile negative 8/24 GI panel negative 8/24 positive Hepatitis C virus 2,560,000 copies 8/24 negative hepatitis a, negative hepatitis B 8/24 HIV 1/2 negative   Antimicrobials: Ceftriaxone 8/23>> Metronidazole 8/23>>   Devices    LINES / TUBES:      Continuous Infusions: . sodium chloride 125 mL/hr at 05/30/16 0340     Objective: Vitals:   05/30/16 0400 05/30/16 0500 05/30/16 0727 05/30/16 0926  BP: 93/69  94/72 106/84  Pulse: (!) 106  (!) 106 97  Resp: 14  12   Temp:   97.6 F (36.4 C)   TempSrc:   Oral   SpO2:   98%   Weight:  105.2 kg (231 lb 14.8 oz)    Height:        Intake/Output Summary (Last 24 hours) at 05/30/16 1154 Last data filed at 05/30/16 1000  Gross per 24 hour  Intake          3027.08 ml  Output              250 ml  Net          2777.08 ml   Filed Weights   05/28/16 0431 05/29/16 0500 05/30/16 0500  Weight: 101 kg (222 lb 10.6 oz) 105.3 kg (232 lb 2.3 oz) 105.2 kg (231 lb 14.8 oz)    Examination:  General: A/O 4, positive persistent diarrhea, No acute respiratory distress Eyes: negative scleral hemorrhage, negative anisocoria, negative icterus ENT: Negative Runny nose, negative gingival bleeding, Neck:  Negative scars, masses, torticollis, lymphadenopathy, JVD Lungs: Clear to auscultation bilaterally without wheezes or crackles Cardiovascular: Tachycardic, Regular rhythm without murmur gallop or rub normal S1 and S2 Abdomen: Positive RUQ/LUQ abdominal pain, positive enlarged liver, positive soft, bowel sounds, no rebound, no ascites, no appreciable mass Extremities: No significant cyanosis, clubbing, or edema bilateral lower extremities Skin: Negative  rashes, lesions, ulcers Psychiatric:  Negative depression, negative anxiety, negative fatigue, negative mania Central nervous system:  Cranial nerves II through XII intact, tongue/uvula midline, all extremities muscle strength 5/5, sensation intact throughout,  negative dysarthria, negative expressive aphasia, negative receptive aphasia.  .     Data Reviewed: Care during the described time interval was provided by me .  I have reviewed this patient's available data, including medical history, events of note, physical examination, and all test results as part of my evaluation. I have personally reviewed and interpreted all radiology studies.  CBC:  Recent Labs Lab 05/27/16 1348 05/28/16 0953 05/29/16 0851 05/29/16 1614 05/30/16 0255  WBC 4.1 3.6* 2.8* 3.6* 4.5  NEUTROABS 2.6 2.1 1.5*  --  3.1  HGB 13.1 13.0 12.5* 12.7* 12.2*  HCT 37.3* 38.1* 37.5* 39.1 35.3*  MCV 101.1* 104.1* 106.8* 107.7* 107.0*  PLT 98* 83* 9* 84* 0000000*   Basic Metabolic Panel:  Recent Labs Lab 05/27/16 1348 05/27/16 2257 05/28/16 0953 05/29/16 0851 05/29/16 1118 05/30/16 0255  NA 138  --  138 138  --  139  K 2.8*  --  3.8 4.7  --  5.0  CL 107  --  112* 114*  --  118*  CO2 24  --  22 17*  --  17*  GLUCOSE 114*  --  122* 89  --  99  BUN <5*  --  <5* <5*  --  <5*  CREATININE 0.89  --  0.85 0.82  --  0.79  CALCIUM 7.8*  --  7.1* 7.0*  --  7.1*  MG  --  1.6* 1.9  --  1.6* 2.0  PHOS  --   --  2.8  --   --   --    GFR: Estimated Creatinine Clearance: 100 mL/min (by C-G formula based on SCr of 0.8 mg/dL). Liver Function Tests:  Recent Labs Lab 05/27/16 1348 05/28/16 0953 05/29/16 0851 05/30/16 0255  AST 147* 126* 116* 118*  ALT 42 36 34 33  ALKPHOS 113 97 103 100  BILITOT 1.9* 1.8* 0.5 1.4*  PROT 5.9* 5.8* 5.2* 5.3*  ALBUMIN 1.9* 1.9* 1.6* 1.6*    Recent Labs Lab 05/27/16 1348 05/28/16 0953 05/29/16 0851  LIPASE 16 19 18     Recent Labs Lab 05/29/16 0643  AMMONIA 102*    Coagulation Profile:  Recent Labs Lab 05/29/16 0643  INR 1.76   Cardiac Enzymes:  Recent Labs Lab 05/27/16 2257 05/28/16 0953  TROPONINI 0.03* 0.03*   BNP (last 3 results) No results for input(s): PROBNP in the last 8760 hours. HbA1C: No results for input(s): HGBA1C in the last 72 hours. CBG: No results for input(s): GLUCAP in the last 168 hours. Lipid Profile: No results for input(s): CHOL, HDL, LDLCALC, TRIG, CHOLHDL, LDLDIRECT in the last 72 hours. Thyroid Function Tests:  Recent Labs  05/27/16 2257  TSH 29.993*   Anemia Panel: No results for input(s): VITAMINB12, FOLATE, FERRITIN, TIBC, IRON, RETICCTPCT in the last 72 hours. Urine analysis:    Component Value Date/Time   COLORURINE ORANGE (A) 05/27/2016 1609   APPEARANCEUR CLOUDY (A) 05/27/2016 1609   LABSPEC 1.021 05/27/2016 1609   PHURINE 6.5 05/27/2016 1609   GLUCOSEU NEGATIVE 05/27/2016 1609   HGBUR NEGATIVE 05/27/2016 1609   BILIRUBINUR MODERATE (A) 05/27/2016 1609   KETONESUR 15 (A) 05/27/2016 1609   PROTEINUR 30 (A) 05/27/2016 1609   UROBILINOGEN 2.0 (H) 12/13/2014 1256   NITRITE POSITIVE (A) 05/27/2016 1609   LEUKOCYTESUR SMALL (A) 05/27/2016 1609   Sepsis Labs: @LABRCNTIP (procalcitonin:4,lacticidven:4)  ) Recent Results (from the past 240 hour(s))  MRSA PCR Screening     Status: None   Collection Time: 05/27/16  8:38 PM  Result Value Ref Range Status   MRSA by PCR NEGATIVE NEGATIVE Final    Comment:        The GeneXpert MRSA Assay (FDA approved for NASAL specimens only), is one component of a comprehensive MRSA colonization surveillance program. It is not intended to diagnose MRSA infection nor to guide or monitor treatment for MRSA infections.   Gastrointestinal Panel by PCR , Stool     Status: None   Collection Time: 05/28/16  2:40 AM  Result Value Ref Range Status   Campylobacter species NOT DETECTED NOT DETECTED Final   Plesimonas shigelloides NOT DETECTED NOT DETECTED Final    Salmonella species NOT DETECTED NOT DETECTED Final   Yersinia enterocolitica NOT DETECTED NOT DETECTED Final   Vibrio species NOT DETECTED NOT DETECTED Final   Vibrio cholerae NOT DETECTED NOT DETECTED Final   Enteroaggregative E coli (EAEC) NOT DETECTED NOT DETECTED Final   Enteropathogenic E coli (EPEC) NOT DETECTED NOT DETECTED Final   Enterotoxigenic E coli (ETEC) NOT DETECTED NOT DETECTED Final   Shiga like toxin producing E coli (STEC) NOT DETECTED  NOT DETECTED Final   E. coli O157 NOT DETECTED NOT DETECTED Final   Shigella/Enteroinvasive E coli (EIEC) NOT DETECTED NOT DETECTED Final   Cryptosporidium NOT DETECTED NOT DETECTED Final   Cyclospora cayetanensis NOT DETECTED NOT DETECTED Final   Entamoeba histolytica NOT DETECTED NOT DETECTED Final   Giardia lamblia NOT DETECTED NOT DETECTED Final   Adenovirus F40/41 NOT DETECTED NOT DETECTED Final   Astrovirus NOT DETECTED NOT DETECTED Final   Norovirus GI/GII NOT DETECTED NOT DETECTED Final   Rotavirus A NOT DETECTED NOT DETECTED Final   Sapovirus (I, II, IV, and V) NOT DETECTED NOT DETECTED Final  C difficile quick scan w PCR reflex     Status: None   Collection Time: 05/28/16  2:40 AM  Result Value Ref Range Status   C Diff antigen NEGATIVE NEGATIVE Final   C Diff toxin NEGATIVE NEGATIVE Final   C Diff interpretation No C. difficile detected.  Final  Culture, blood (routine x 2)     Status: None (Preliminary result)   Collection Time: 05/28/16  5:05 PM  Result Value Ref Range Status   Specimen Description BLOOD LEFT HAND  Final   Special Requests IN PEDIATRIC BOTTLE 2CC  Final   Culture NO GROWTH < 24 HOURS  Final   Report Status PENDING  Incomplete  Culture, blood (routine x 2)     Status: None (Preliminary result)   Collection Time: 05/28/16  5:26 PM  Result Value Ref Range Status   Specimen Description BLOOD RIGHT HAND  Final   Special Requests IN PEDIATRIC BOTTLE 0.5CC  Final   Culture NO GROWTH < 24 HOURS  Final    Report Status PENDING  Incomplete         Radiology Studies: Dg Chest Port 1 View  Result Date: 05/30/2016 CLINICAL DATA:  Line placement EXAM: PORTABLE CHEST 1 VIEW COMPARISON:  04/28/2016 FINDINGS: Right PICC line is in place with the tip in the upper SVC. Heart is normal size. No confluent airspace opacities or effusions. No acute bony abnormality. IMPRESSION: Right PICC line tip in the upper SVC. No active disease. Electronically Signed   By: Rolm Baptise M.D.   On: 05/30/2016 11:14        Scheduled Meds: . aspirin EC  81 mg Oral Daily  . cefTRIAXone (ROCEPHIN)  IV  2 g Intravenous Q24H  . enoxaparin (LOVENOX) injection  40 mg Subcutaneous Q24H  . folic acid  1 mg Oral Daily  . levothyroxine  25 mcg Intravenous Daily  . LORazepam  0-4 mg Oral Q12H  . metoprolol tartrate  12.5 mg Oral BID  . metronidazole  500 mg Intravenous Q8H  . multivitamin with minerals  1 tablet Oral Daily  . pantoprazole  40 mg Oral Daily  . sucralfate  1 g Oral TID WC & HS  . thiamine  100 mg Oral Daily  . traMADol  50 mg Oral TID  . traZODone  100 mg Oral QHS   Continuous Infusions: . sodium chloride 125 mL/hr at 05/30/16 0340     LOS: 2 days    Time spent: 40 minutes    Charlii Yost, Geraldo Docker, MD [facility] Pager [phone]   If 7PM-7AM, please contact night-coverage [email or web address] Password [facility] 05/30/2016, 11:54 AM

## 2016-05-31 DIAGNOSIS — B1710 Acute hepatitis C without hepatic coma: Secondary | ICD-10-CM

## 2016-05-31 DIAGNOSIS — K7030 Alcoholic cirrhosis of liver without ascites: Secondary | ICD-10-CM | POA: Diagnosis present

## 2016-05-31 LAB — COMPREHENSIVE METABOLIC PANEL
ALK PHOS: 83 U/L (ref 38–126)
ALT: 30 U/L (ref 17–63)
AST: 102 U/L — AB (ref 15–41)
Albumin: 1.6 g/dL — ABNORMAL LOW (ref 3.5–5.0)
Anion gap: 1 — ABNORMAL LOW (ref 5–15)
CALCIUM: 7.5 mg/dL — AB (ref 8.9–10.3)
CO2: 24 mmol/L (ref 22–32)
CREATININE: 0.82 mg/dL (ref 0.61–1.24)
Chloride: 115 mmol/L — ABNORMAL HIGH (ref 101–111)
GFR calc non Af Amer: >60 mL/min (ref >60)
GLUCOSE: 98 mg/dL (ref 65–99)
Potassium: 4 mmol/L (ref 3.5–5.1)
SODIUM: 140 mmol/L (ref 135–145)
Total Bilirubin: 1.1 mg/dL (ref 0.3–1.2)
Total Protein: 4.8 g/dL — ABNORMAL LOW (ref 6.5–8.1)

## 2016-05-31 LAB — CBC WITH DIFFERENTIAL/PLATELET
BASOS ABS: 0 10*3/uL (ref 0.0–0.1)
Basophils Relative: 0 %
Eosinophils Absolute: 0.1 10*3/uL (ref 0.0–0.7)
Eosinophils Relative: 2 %
HEMATOCRIT: 35.2 % — AB (ref 39.0–52.0)
HEMOGLOBIN: 11.4 g/dL — AB (ref 13.0–17.0)
LYMPHS PCT: 25 %
Lymphs Abs: 0.8 10*3/uL (ref 0.7–4.0)
MCH: 35 pg — AB (ref 26.0–34.0)
MCHC: 32.4 g/dL (ref 30.0–36.0)
MCV: 108 fL — ABNORMAL HIGH (ref 78.0–100.0)
MONO ABS: 0.5 10*3/uL (ref 0.1–1.0)
Monocytes Relative: 15 %
Neutro Abs: 1.8 10*3/uL (ref 1.7–7.7)
Neutrophils Relative %: 58 %
Platelets: 83 10*3/uL — ABNORMAL LOW (ref 150–400)
RBC: 3.26 MIL/uL — ABNORMAL LOW (ref 4.22–5.81)
RDW: 18.2 % — ABNORMAL HIGH (ref 11.5–15.5)
WBC: 3.1 10*3/uL — AB (ref 4.0–10.5)

## 2016-05-31 LAB — MAGNESIUM: Magnesium: 1.7 mg/dL (ref 1.7–2.4)

## 2016-05-31 MED ORDER — FUROSEMIDE 20 MG PO TABS
20.0000 mg | ORAL_TABLET | Freq: Every day | ORAL | Status: DC
  Administered 2016-05-31 – 2016-06-03 (×4): 20 mg via ORAL
  Filled 2016-05-31 (×4): qty 1

## 2016-05-31 MED ORDER — DIGOXIN 0.25 MG/ML IJ SOLN
0.2500 mg | Freq: Once | INTRAMUSCULAR | Status: AC
  Administered 2016-05-31: 0.25 mg via INTRAVENOUS
  Filled 2016-05-31: qty 2

## 2016-05-31 NOTE — Progress Notes (Signed)
PROGRESS NOTE    Nathan Frank  [id number] DOB: Oct 23, 1940 DOA: 05/27/2016 PCP: Thressa Sheller, MD   Brief Narrative:  75 y.o. BM PMHx Depressive disorder, polysubstance abuse (Heroin overdose, Marijuana), EtOH abuse, liver cirrhosis, hepatitis C,NSTEMI, Atrial Fibrillation, Chronic Diastolic CHF, Prolonged Q-T interval ,Upper GI bleeding,DVT of lower limb  Present to the ER because of weakness and diarrhea. Patient states he has been feeling fatigued and weak over the last 2 days with constant diarrhea over the last 1 week. Denies any nausea vomiting. Has been having crampy abdominal pain. Denies any sick contacts or recent travel. Patient is tachycardic in the 130s. Patient states he has not taken any of his medications last 1 week. Was recently admitted last month for similar complaints with patient be also in A. fib with RVR. At that time patient was placed on amiodarone. Last dose taken as per the patient was a week ago. Patient was given fluid bolus in the ER and admitted for further management. Patient states his diarrhea was watery denies any blood in the diarrhea. Denies any chest pain or shortness of breath.   Subjective: 8/27  A/O 4, states has been up ambulating no further episodes of lightheadedness/weakness. Negative postprandial N/V/D. States does not know why he was not taking prescribed medication, but admits to being noncompliant.     Assessment & Plan:   Principal Problem:   Generalized weakness Active Problems:   Diarrhea   Alcohol withdrawal (HCC)   Hypokalemia   Dehydration   Hepatocellular carcinoma (HCC)   Paroxysmal atrial fibrillation (HCC)   Other specified hypothyroidism   Alcoholic cirrhosis of liver with ascites (HCC)   Persistent Diarrhea  - Negative C. difficile and GI pathogen panel. -CT abdomen and pelvis; liver cirrhosis, thickening of cecum and ascending colon see results below -Continue current antibiotics -Alpha-fetoprotein,  normal -CA 19-9 +45 -Chronic diarrhea labs pending -Requested PICC line placement secondary to chronic illness and requirement for extensive IV medications and lab checks -After speaking with Dr. Fuller Plan from [place] felt it may be beneficial to at least obtain abdominal MRI to evaluate pancreas further. -8/27 Abdominal MRI; nondiagnostic for any lesions concerning for cancer. Schedule follow-up in 1-2 weeks with Dr. Lucio Edward for completion of GI workup. Chronic diarrhea, liver cirrhosis.,   SBP?/Infective colitis? -Patient appears to be recovering nicely will DC anabiotic's on 8/28 for infective colitis. -May want to discharge patient on SBP prophylaxis vs wait for patient to follow-up with GI for decision to be made on chronic antibiotic use for prophylaxis.  Alcohol abuse  - states his last drink was 4 days ago. Patient is placed on CIWA protocol.  Hepatitis C  - has not been taking his Lasix and spironolactone last 1 week. Presently receiving IV fluids. -8/26 spoke with Dr. Bobby Rumpf ID states: Schedule a establish care appointment with Dr. Bobby Rumpf in 1-2 weeks for hepatitis C infection  EtOH liver cirrhosis -8/25 NH4= 102: Asymptomatic  Generalized weakness  -probably from diarrhea and dehydration - closely observe. .  Hypokalemia -Potassium goal> 4  Hypomagnesemia -Magnesium goal> 2 -Magnesium IV 2 g .  Hypocalcemia -Corrected calcium= 9.1  QT prolongation -Obtain repeat EKG prior to starting amiodarone  Tachycardia /Paroxysmal A. Fib( Chads 2 vasc score is 4). -secondary to EtOH withdrawal? -Metoprolol 12.5 mg BID -8/27 if no QT prolongation would start Amiodarone 100 mg BID (was supposed to be on 200 mg BID not taking) -8/27 start Lasix 20 mg daily (was not taking) -8/27  Digoxin 0.25 mg 1: If continued QT prolongation on repeat EKG would continue use digoxin instead of amiodarone - not on anticoagulants secondary to history of GI bleed.    Chronic diastolic CHF/dilated cardiomyopathy -Strict I&O since admission +9.3 L -Daily weight Filed Weights   05/30/16 0500 05/30/16 2254 05/31/16 0414  Weight: 105.2 kg (231 lb 14.8 oz) 109.9 kg (242 lb 4.6 oz) 109.9 kg (242 lb 4.6 oz)    Thrombocytopenia and macrocytosis  -probably from alcoholism - closely follow CBC.  Hypothyroidism -8/23 TSH= 29.9 -Start Synthroid IV 25 g daily     DVT prophylaxis: Lovenox Code Status: Full Family Communication: None Disposition Plan: ?   Consultants:  Dr. Fuller Plan GI phone consult   Procedures/Significant Events:  7/17 echocardiogram: LVEF = 55%-60%. -(grade 2 diastolic dysfunction). - Left atrium:  moderately dilated. - Right atrium: mildly dilated.- Tricuspid valve:  moderate regurgitation. - Pulmonary arteries:  PA  peak pressure: 33 mm Hg (S). 8/24 CT abdomen pelvis:. -Colonic wall thickening involving the cecum and proximal ascending colon. Infectious colitis vs  Inflammatory, vs portal enteropathy. -Advanced cirrhosis with significant fatty infiltration liver.  -Peri-gastric and paraesophageal varices.  -Cholelithiasis without signs of gallbladder inflammation. 8/27 MRI abdomen:-Mild dilatation of the common hepatic duct and common bile duct without choledocholithiasis or obstruction evident. - Morphologic changes in liver consistent cirrhosis. 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  -Small effusions and anasarca in the trunk.    Cultures 8/23 MRSA by PCR negative 8/24 blood 2 pending 8/24 C. difficile negative 8/24 GI panel negative 8/24 positive Hepatitis C virus 2,560,000 copies 8/24 negative hepatitis a, negative hepatitis B 8/24 HIV 1/2 negative   Antimicrobials: Ceftriaxone 8/23>> Metronidazole 8/23>>   Devices    LINES / TUBES:      Continuous Infusions: . sodium chloride 10 mL/hr at 05/31/16 1206     Objective: Vitals:   05/31/16 0811 05/31/16 0940 05/31/16 1200  05/31/16 1649  BP: 95/84 106/86 100/85 107/86  Pulse: (!) 107  (!) 111   Resp: 12     Temp: 97.5 F (36.4 C)   (!) 96.5 F (35.8 C)  TempSrc: Oral   Axillary  SpO2: 96%     Weight:      Height:        Intake/Output Summary (Last 24 hours) at 05/31/16 1958 Last data filed at 05/31/16 0400  Gross per 24 hour  Intake              500 ml  Output               75 ml  Net              425 ml   Filed Weights   05/30/16 0500 05/30/16 2254 05/31/16 0414  Weight: 105.2 kg (231 lb 14.8 oz) 109.9 kg (242 lb 4.6 oz) 109.9 kg (242 lb 4.6 oz)    Examination:  General: A/O 4, No acute respiratory distress Eyes: negative scleral hemorrhage, negative anisocoria, negative icterus ENT: Negative Runny nose, negative gingival bleeding, Neck:  Negative scars, masses, torticollis, lymphadenopathy, JVD Lungs: Clear to auscultation bilaterally without wheezes or crackles Cardiovascular: Irregular irregular rhythm and rate without murmur gallop or rub normal S1 and S2 Abdomen: Positive RUQ/LUQ abdominal pain, positive enlarged liver, positive soft, bowel sounds, no rebound, no ascites, no appreciable mass Extremities: positive bilateral lower extremity edema 2+ to mid thigh.  Skin: Negative rashes, lesions, ulcers Psychiatric:  Negative depression, negative anxiety, negative fatigue, negative mania  Central nervous system:  Cranial nerves II through XII intact, tongue/uvula midline, all extremities muscle strength 5/5, sensation intact throughout,  negative dysarthria, negative expressive aphasia, negative receptive aphasia.  .     Data Reviewed: Care during the described time interval was provided by me .  I have reviewed this patient's available data, including medical history, events of note, physical examination, and all test results as part of my evaluation. I have personally reviewed and interpreted all radiology studies.  CBC:  Recent Labs Lab 05/27/16 1348 05/28/16 0953 05/29/16 0851  05/29/16 1614 05/30/16 0255 05/31/16 0437  WBC 4.1 3.6* 2.8* 3.6* 4.5 3.1*  NEUTROABS 2.6 2.1 1.5*  --  3.1 1.8  HGB 13.1 13.0 12.5* 12.7* 12.2* 11.4*  HCT 37.3* 38.1* 37.5* 39.1 35.3* 35.2*  MCV 101.1* 104.1* 106.8* 107.7* 107.0* 108.0*  PLT 98* 83* 9* 84* 137* 83*   Basic Metabolic Panel:  Recent Labs Lab 05/28/16 0953 05/29/16 0851 05/29/16 1118 05/30/16 0255 05/30/16 1441 05/31/16 0437  NA 138 138  --  139 139 140  K 3.8 4.7  --  5.0 3.9 4.0  CL 112* 114*  --  118* 111 115*  CO2 22 17*  --  17* 28 24  GLUCOSE 122* 89  --  99 99 98  BUN <5* <5*  --  <5* <5* <5*  CREATININE 0.85 0.82  --  0.79 0.79 0.82  CALCIUM 7.1* 7.0*  --  7.1* 6.6* 7.5*  MG 1.9  --  1.6* 2.0 1.7 1.7  PHOS 2.8  --   --   --   --   --    GFR: Estimated Creatinine Clearance: 99.6 mL/min (by C-G formula based on SCr of 0.82 mg/dL). Liver Function Tests:  Recent Labs Lab 05/27/16 1348 05/28/16 0953 05/29/16 0851 05/30/16 0255 05/31/16 0437  AST 147* 126* 116* 118* 102*  ALT 42 36 34 33 30  ALKPHOS 113 97 103 100 83  BILITOT 1.9* 1.8* 0.5 1.4* 1.1  PROT 5.9* 5.8* 5.2* 5.3* 4.8*  ALBUMIN 1.9* 1.9* 1.6* 1.6* 1.6*    Recent Labs Lab 05/27/16 1348 05/28/16 0953 05/29/16 0851  LIPASE 16 19 18     Recent Labs Lab 05/29/16 0643  AMMONIA 102*   Coagulation Profile:  Recent Labs Lab 05/29/16 0643  INR 1.76   Cardiac Enzymes:  Recent Labs Lab 05/27/16 2257 05/28/16 0953  TROPONINI 0.03* 0.03*   BNP (last 3 results) No results for input(s): PROBNP in the last 8760 hours. HbA1C: No results for input(s): HGBA1C in the last 72 hours. CBG: No results for input(s): GLUCAP in the last 168 hours. Lipid Profile: No results for input(s): CHOL, HDL, LDLCALC, TRIG, CHOLHDL, LDLDIRECT in the last 72 hours. Thyroid Function Tests: No results for input(s): TSH, T4TOTAL, FREET4, T3FREE, THYROIDAB in the last 72 hours. Anemia Panel: No results for input(s): VITAMINB12, FOLATE, FERRITIN,  TIBC, IRON, RETICCTPCT in the last 72 hours. Urine analysis:    Component Value Date/Time   COLORURINE ORANGE (A) 05/27/2016 1609   APPEARANCEUR CLOUDY (A) 05/27/2016 1609   LABSPEC 1.021 05/27/2016 1609   PHURINE 6.5 05/27/2016 1609   GLUCOSEU NEGATIVE 05/27/2016 1609   HGBUR NEGATIVE 05/27/2016 1609   BILIRUBINUR MODERATE (A) 05/27/2016 1609   KETONESUR 15 (A) 05/27/2016 1609   PROTEINUR 30 (A) 05/27/2016 1609   UROBILINOGEN 2.0 (H) 12/13/2014 1256   NITRITE POSITIVE (A) 05/27/2016 1609   LEUKOCYTESUR SMALL (A) 05/27/2016 1609   Sepsis Labs: @LABRCNTIP (procalcitonin:4,lacticidven:4)  ) Recent Results (  from the past 240 hour(s))  MRSA PCR Screening     Status: None   Collection Time: 05/27/16  8:38 PM  Result Value Ref Range Status   MRSA by PCR NEGATIVE NEGATIVE Final    Comment:        The GeneXpert MRSA Assay (FDA approved for NASAL specimens only), is one component of a comprehensive MRSA colonization surveillance program. It is not intended to diagnose MRSA infection nor to guide or monitor treatment for MRSA infections.   Gastrointestinal Panel by PCR , Stool     Status: None   Collection Time: 05/28/16  2:40 AM  Result Value Ref Range Status   Campylobacter species NOT DETECTED NOT DETECTED Final   Plesimonas shigelloides NOT DETECTED NOT DETECTED Final   Salmonella species NOT DETECTED NOT DETECTED Final   Yersinia enterocolitica NOT DETECTED NOT DETECTED Final   Vibrio species NOT DETECTED NOT DETECTED Final   Vibrio cholerae NOT DETECTED NOT DETECTED Final   Enteroaggregative E coli (EAEC) NOT DETECTED NOT DETECTED Final   Enteropathogenic E coli (EPEC) NOT DETECTED NOT DETECTED Final   Enterotoxigenic E coli (ETEC) NOT DETECTED NOT DETECTED Final   Shiga like toxin producing E coli (STEC) NOT DETECTED NOT DETECTED Final   E. coli O157 NOT DETECTED NOT DETECTED Final   Shigella/Enteroinvasive E coli (EIEC) NOT DETECTED NOT DETECTED Final    Cryptosporidium NOT DETECTED NOT DETECTED Final   Cyclospora cayetanensis NOT DETECTED NOT DETECTED Final   Entamoeba histolytica NOT DETECTED NOT DETECTED Final   Giardia lamblia NOT DETECTED NOT DETECTED Final   Adenovirus F40/41 NOT DETECTED NOT DETECTED Final   Astrovirus NOT DETECTED NOT DETECTED Final   Norovirus GI/GII NOT DETECTED NOT DETECTED Final   Rotavirus A NOT DETECTED NOT DETECTED Final   Sapovirus (I, II, IV, and V) NOT DETECTED NOT DETECTED Final  C difficile quick scan w PCR reflex     Status: None   Collection Time: 05/28/16  2:40 AM  Result Value Ref Range Status   C Diff antigen NEGATIVE NEGATIVE Final   C Diff toxin NEGATIVE NEGATIVE Final   C Diff interpretation No C. difficile detected.  Final  Culture, blood (routine x 2)     Status: None (Preliminary result)   Collection Time: 05/28/16  5:05 PM  Result Value Ref Range Status   Specimen Description BLOOD LEFT HAND  Final   Special Requests IN PEDIATRIC BOTTLE 2CC  Final   Culture NO GROWTH 3 DAYS  Final   Report Status PENDING  Incomplete  Culture, blood (routine x 2)     Status: None (Preliminary result)   Collection Time: 05/28/16  5:26 PM  Result Value Ref Range Status   Specimen Description BLOOD RIGHT HAND  Final   Special Requests IN PEDIATRIC BOTTLE 0.5CC  Final   Culture NO GROWTH 3 DAYS  Final   Report Status PENDING  Incomplete         Radiology Studies: Mr Abdomen W Wo Contrast  Result Date: 05/31/2016 CLINICAL DATA:  Diarrhea, cirrhosis. EXAM: MRI ABDOMEN WITHOUT AND WITH CONTRAST TECHNIQUE: Multiplanar multisequence MR imaging of the abdomen was performed both before and after the administration of intravenous contrast. CONTRAST:  68mL MULTIHANCE GADOBENATE DIMEGLUMINE 529 MG/ML IV SOLN COMPARISON:  CT 05/28/2016 FINDINGS: Lower chest: Small bilateral pleural effusions. Anasarca soft tissues the trunk Hepatobiliary: Marked hepatic steatosis noted on opposed phase imaging (series 8). Liver  has a nodular contour and is shrunken. There is no focal  hepatic lesion. Mild gallbladder wall thickening. Common bile duct is upper limits of normal at 6 mm common hepatic duct similar 7 mm no filling defect within the common bile duct (series 4). Portal veins are patent. Splenic vein is patent. Venous collaterals the epigastric region along the esophagus. No ascites. Delayed contrast imaging demonstrates linear enhancement in the liver consistent with architectural distortion of fibrosis. No discrete enhancing lesion. Pancreas: No duct dilatation.  No abnormal enhancement Spleen: Normal volume. Adrenals/urinary tract: Adrenal glands and kidneys are normal. Stomach/Bowel: Epigastric varices. Stomach limited view of the bowel is unremarkable Vascular/Lymphatic: Abdominal aortic normal caliber. No retroperitoneal periportal lymphadenopathy. Musculoskeletal: No aggressive osseous lesion IMPRESSION: 1. Mild dilatation of the common hepatic duct and common bile duct without choledocholithiasis or obstruction evident. 2. No clear stones within the gallbladder. Sludge within the gallbladder. No gallbladder distension. 3. Morphologic changes in liver consistent cirrhosis. 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 No evidence hepatoma. No ascites. 4. Marked hepatic steatosis. 5. Small effusions and anasarca in the trunk. Electronically Signed   By: Suzy Bouchard M.D.   On: 05/31/2016 07:16   Dg Chest Port 1 View  Result Date: 05/30/2016 CLINICAL DATA:  Line placement EXAM: PORTABLE CHEST 1 VIEW COMPARISON:  04/28/2016 FINDINGS: Right PICC line is in place with the tip in the upper SVC. Heart is normal size. No confluent airspace opacities or effusions. No acute bony abnormality. IMPRESSION: Right PICC line tip in the upper SVC. No active disease. Electronically Signed   By: Rolm Baptise M.D.   On: 05/30/2016 11:14        Scheduled Meds: . aspirin EC  81 mg Oral Daily  . cefTRIAXone  (ROCEPHIN)  IV  2 g Intravenous Q24H  . enoxaparin (LOVENOX) injection  40 mg Subcutaneous Q24H  . folic acid  1 mg Oral Daily  . furosemide  20 mg Oral Daily  . levothyroxine  25 mcg Intravenous Daily  . LORazepam  0-4 mg Oral Q12H  . metoprolol tartrate  12.5 mg Oral BID  . metroNIDAZOLE  500 mg Oral Q8H  . multivitamin with minerals  1 tablet Oral Daily  . pantoprazole  40 mg Oral Daily  . sucralfate  1 g Oral TID WC & HS  . thiamine  100 mg Oral Daily  . traMADol  50 mg Oral TID  . traZODone  100 mg Oral QHS   Continuous Infusions: . sodium chloride 10 mL/hr at 05/31/16 1206     LOS: 3 days    Time spent: 40 minutes    Mame Twombly, Geraldo Docker, MD [facility] Pager [phone]   If 7PM-7AM, please contact night-coverage [email or web address] Password [facility] 05/31/2016, 7:58 PM

## 2016-06-01 LAB — PROTIME-INR
INR: 1.64
PROTHROMBIN TIME: 19.6 s — AB (ref 11.4–15.2)

## 2016-06-01 LAB — COMPREHENSIVE METABOLIC PANEL
ALT: 27 U/L (ref 17–63)
ANION GAP: 2 — AB (ref 5–15)
AST: 92 U/L — ABNORMAL HIGH (ref 15–41)
Albumin: 1.5 g/dL — ABNORMAL LOW (ref 3.5–5.0)
Alkaline Phosphatase: 92 U/L (ref 38–126)
BUN: <5 mg/dL — ABNORMAL LOW (ref 6–20)
CHLORIDE: 112 mmol/L — AB (ref 101–111)
CO2: 25 mmol/L (ref 22–32)
Calcium: 7.4 mg/dL — ABNORMAL LOW (ref 8.9–10.3)
Creatinine, Ser: 0.78 mg/dL (ref 0.61–1.24)
GFR calc non Af Amer: >60 mL/min (ref >60)
Glucose, Bld: 93 mg/dL (ref 65–99)
Potassium: 3.6 mmol/L (ref 3.5–5.1)
SODIUM: 139 mmol/L (ref 135–145)
Total Bilirubin: 0.9 mg/dL (ref 0.3–1.2)
Total Protein: 4.8 g/dL — ABNORMAL LOW (ref 6.5–8.1)

## 2016-06-01 LAB — AMMONIA: Ammonia: 60 umol/L — ABNORMAL HIGH (ref 9–35)

## 2016-06-01 LAB — CBC WITH DIFFERENTIAL/PLATELET
Basophils Absolute: 0 10*3/uL (ref 0.0–0.1)
Basophils Relative: 1 %
Eosinophils Absolute: 0.1 10*3/uL (ref 0.0–0.7)
Eosinophils Relative: 2 %
HEMATOCRIT: 34.7 % — AB (ref 39.0–52.0)
HEMOGLOBIN: 11.4 g/dL — AB (ref 13.0–17.0)
LYMPHS ABS: 0.8 10*3/uL (ref 0.7–4.0)
LYMPHS PCT: 30 %
MCH: 35.1 pg — AB (ref 26.0–34.0)
MCHC: 32.9 g/dL (ref 30.0–36.0)
MCV: 106.8 fL — ABNORMAL HIGH (ref 78.0–100.0)
MONOS PCT: 18 %
Monocytes Absolute: 0.5 10*3/uL (ref 0.1–1.0)
NEUTROS ABS: 1.3 10*3/uL — AB (ref 1.7–7.7)
NEUTROS PCT: 48 %
Platelets: 80 10*3/uL — ABNORMAL LOW (ref 150–400)
RBC: 3.25 MIL/uL — ABNORMAL LOW (ref 4.22–5.81)
RDW: 18.2 % — ABNORMAL HIGH (ref 11.5–15.5)
WBC: 2.8 10*3/uL — ABNORMAL LOW (ref 4.0–10.5)

## 2016-06-01 LAB — GLIADIN ANTIBODIES, SERUM
GLIADIN IGA: 10 U (ref 0–19)
GLIADIN IGG: 2 U (ref 0–19)

## 2016-06-01 LAB — MAGNESIUM: Magnesium: 1.5 mg/dL — ABNORMAL LOW (ref 1.7–2.4)

## 2016-06-01 MED ORDER — LEVOTHYROXINE SODIUM 50 MCG PO TABS
50.0000 ug | ORAL_TABLET | Freq: Every day | ORAL | Status: DC
  Administered 2016-06-02 – 2016-06-03 (×2): 50 ug via ORAL
  Filled 2016-06-01 (×2): qty 1

## 2016-06-01 MED ORDER — ENSURE ENLIVE PO LIQD
237.0000 mL | Freq: Two times a day (BID) | ORAL | Status: DC
  Administered 2016-06-02 – 2016-06-03 (×3): 237 mL via ORAL

## 2016-06-01 MED ORDER — SODIUM CHLORIDE 0.9% FLUSH
10.0000 mL | INTRAVENOUS | Status: DC | PRN
  Administered 2016-06-03: 10 mL
  Filled 2016-06-01: qty 40

## 2016-06-01 MED ORDER — ACETAMINOPHEN 325 MG RE SUPP: 325.0000 mg | Freq: Four times a day (QID) | RECTAL | Status: DC | PRN

## 2016-06-01 MED ORDER — METOPROLOL TARTRATE 25 MG PO TABS
25.0000 mg | ORAL_TABLET | Freq: Two times a day (BID) | ORAL | Status: DC
  Administered 2016-06-01 – 2016-06-02 (×2): 25 mg via ORAL
  Filled 2016-06-01 (×2): qty 1

## 2016-06-01 MED ORDER — MAGNESIUM SULFATE 2 GM/50ML IV SOLN
2.0000 g | Freq: Once | INTRAVENOUS | Status: AC
  Administered 2016-06-01: 2 g via INTRAVENOUS
  Filled 2016-06-01: qty 50

## 2016-06-01 MED ORDER — ACETAMINOPHEN 500 MG PO TABS: 500.0000 mg | ORAL_TABLET | Freq: Four times a day (QID) | ORAL | Status: DC | PRN

## 2016-06-01 NOTE — Care Management Important Message (Signed)
Important Message  Patient Details  Name: Nathan Frank MRN: [id number] Date of Birth: 04/24/1941   Medicare Important Message Given:  Yes    [name] May 06/01/2016, 11:46 AM

## 2016-06-01 NOTE — Progress Notes (Signed)
[place] TEAM 1 - Stepdown/ICU TEAM  Nathan Frank  [id number] DOB: June 18, 1941 DOA: 05/27/2016 PCP: Thressa Sheller, MD    Brief Narrative:  75 y.o. MHx Depressive disorder, polysubstance abuse (Heroin, Marijuana), EtOH abuse, liver cirrhosis, hepatitis C, NSTEMI, Atrial Fibrillation, Chronic Diastolic CHF, Prolonged QT, Upper GI bleeding, and DVT who presented to the ER w/ weakness and diarrhea. Patient stated he had been feeling fatigued and weak over 2 days with constant diarrhea over 1 week. Denied sick contacts or recent travel. Admitted he had not taken any meds for 1 week.  Patient was tachycardic in the 130s. Patient states he has not taken any of his medications last 1 week.   Subjective: The patient is resting quietly in bed.  He has not yet been up or made attempts to ambulate.  He has been resisting getting into a chair despite encouragement from his nurse today.  He denies abdominal pain chest pain shortness breath fevers chills.  Assessment & Plan:  Persistent Diarrhea- ?infective colitis  - Negative C. difficile and GI pathogen panel -CT abdomen and pelvis; liver cirrhosis, thickening of cecum and ascending colon  -to complete abx course today  -Alpha-fetoprotein, normal -CA 19-9 is 45 -Chronic diarrhea labs pending -Requested PICC line placement secondary to chronic illness and requirement for extensive IV medications and lab checks -After speaking with Dr. Fuller Plan from [facility] felt it may be beneficial to at least obtain abdominal MRI to evaluate pancreas further. -8/27 Abdominal MRI; nondiagnostic for any lesions concerning for cancer. Schedule follow-up in 1-2 weeks with Dr. Lucio Edward for completion of GI workup. Chronic diarrhea, liver cirrhosis.  Alcohol abuse - states his last drink was 4 days before admit - does not appear to be in florid withdrawal at this time   Hepatitis C -has not been taking his Lasix and spironolactone  -8/26 Dr. Sherral Hammers spoke  with Dr. Bobby Rumpf who suggested we schedule an establish care appointment with Dr. Bobby Rumpf in 1-2 weeks for hepatitis C infection  EtOH liver cirrhosis -pt has been counseled on the absolute need to comply w/ his med regimen, and to avoid EtOH - Tbil is normal - crt is normal   Generalized weakness -due to diarrhea and dehydration - PT/OT to eval   Hypokalemia -Due to GI losses and poor intake - supplement and continue to follow  Hypomagnesemia -Replace via IV to avoid worsening diarrhea - goal is 2.0  QT prolongation -Correct electrolytes and follow   Paroxysmal A. Fib -CHA2DS2 - VASc is 4 - not on anticoagulants secondary to history of GI bleed -Ongoing use of amiodarone in this alcoholic with liver disease is ill advised - patient is noncompliant with his medications regardless -Attempt to control rate with beta blocker titration  Chronic diastolic CHF/dilated cardiomyopathy -No evidence of gross volume overload at present  [facility] Weights   05/30/16 2254 05/31/16 0414 06/01/16 0339  Weight: 109.9 kg (242 lb 4.6 oz) 109.9 kg (242 lb 4.6 oz) 110 kg (242 lb 8.1 oz)    Thrombocytopeniaand macrocytosis  -due to EtOH abuse/cirrhosis - follow   Hypothyroidism -8/23 TSH 29.9 - synthroid has been started - exercise care in pt w/ afib    DVT prophylaxis: lovenox Code Status: FULL CODE Family Communication: no family present at time of exam  Disposition Plan: transfer to tele bed - PT/OT - follow rhythm - follow GI output   Consultants:  none  Procedures: none  Antimicrobials:  Ceftriaxone 8/23 > 8/28 Metronidazole 8/23 >  8/28  Objective: Blood pressure (!) 107/95, pulse 88, temperature 97.5 F (36.4 C), temperature source Axillary, resp. rate 16, height 6' (1.829 m), weight 110 kg (242 lb 8.1 oz), SpO2 99 %.  Intake/Output Summary (Last 24 hours) at 06/01/16 1537 Last data filed at 06/01/16 1409  Gross per 24 hour  Intake             1094 ml    Output              280 ml  Net              814 ml   Filed Weights   05/30/16 2254 05/31/16 0414 06/01/16 0339  Weight: 109.9 kg (242 lb 4.6 oz) 109.9 kg (242 lb 4.6 oz) 110 kg (242 lb 8.1 oz)    Examination: General: No acute respiratory distress Lungs: Clear to auscultation bilaterally without wheezes or crackles Cardiovascular: Regular rate and rhythm without murmur gallop or rub normal S1 and S2 Abdomen: Nontender, nondistended, soft, bowel sounds positive, no rebound, no ascites, no appreciable mass Extremities: No significant cyanosis, clubbing, or edema bilateral lower extremities  CBC:  Recent Labs Lab 05/28/16 0953 05/29/16 0851 05/29/16 1614 05/30/16 0255 05/31/16 0437 06/01/16 0338  WBC 3.6* 2.8* 3.6* 4.5 3.1* 2.8*  NEUTROABS 2.1 1.5*  --  3.1 1.8 1.3*  HGB 13.0 12.5* 12.7* 12.2* 11.4* 11.4*  HCT 38.1* 37.5* 39.1 35.3* 35.2* 34.7*  MCV 104.1* 106.8* 107.7* 107.0* 108.0* 106.8*  PLT 83* 9* 84* 137* 83* 80*   Basic Metabolic Panel:  Recent Labs Lab 05/28/16 0953 05/29/16 0851 05/29/16 1118 05/30/16 0255 05/30/16 1441 05/31/16 0437 06/01/16 0338  NA 138 138  --  139 139 140 139  K 3.8 4.7  --  5.0 3.9 4.0 3.6  CL 112* 114*  --  118* 111 115* 112*  CO2 22 17*  --  17* 28 24 25   GLUCOSE 122* 89  --  99 99 98 93  BUN <5* <5*  --  <5* <5* <5* <5*  CREATININE 0.85 0.82  --  0.79 0.79 0.82 0.78  CALCIUM 7.1* 7.0*  --  7.1* 6.6* 7.5* 7.4*  MG 1.9  --  1.6* 2.0 1.7 1.7 1.5*  PHOS 2.8  --   --   --   --   --   --    GFR: Estimated Creatinine Clearance: 102.2 mL/min (by C-G formula based on SCr of 0.8 mg/dL).  Liver Function Tests:  Recent Labs Lab 05/28/16 0953 05/29/16 0851 05/30/16 0255 05/31/16 0437 06/01/16 0338  AST 126* 116* 118* 102* 92*  ALT 36 34 33 30 27  ALKPHOS 97 103 100 83 92  BILITOT 1.8* 0.5 1.4* 1.1 0.9  PROT 5.8* 5.2* 5.3* 4.8* 4.8*  ALBUMIN 1.9* 1.6* 1.6* 1.6* 1.5*    Recent Labs Lab 05/27/16 1348 05/28/16 0953  05/29/16 0851  LIPASE 16 19 18     Recent Labs Lab 05/29/16 0643 06/01/16 0338  AMMONIA 102* 60*    Coagulation Profile:  Recent Labs Lab 05/29/16 0643 06/01/16 0338  INR 1.76 1.64    Cardiac Enzymes:  Recent Labs Lab 05/27/16 2257 05/28/16 0953  TROPONINI 0.03* 0.03*    HbA1C: Hgb A1c MFr Bld  Date/Time Value Ref Range Status  04/19/2016 02:12 AM 4.6 (L) 4.8 - 5.6 % Final    Comment:    (NOTE)         Pre-diabetes: 5.7 - 6.4  Diabetes: >6.4         Glycemic control for adults with diabetes: <7.0      Recent Results (from the past 240 hour(s))  MRSA PCR Screening     Status: None   Collection Time: 05/27/16  8:38 PM  Result Value Ref Range Status   MRSA by PCR NEGATIVE NEGATIVE Final    Comment:        The GeneXpert MRSA Assay (FDA approved for NASAL specimens only), is one component of a comprehensive MRSA colonization surveillance program. It is not intended to diagnose MRSA infection nor to guide or monitor treatment for MRSA infections.   Gastrointestinal Panel by PCR , Stool     Status: None   Collection Time: 05/28/16  2:40 AM  Result Value Ref Range Status   Campylobacter species NOT DETECTED NOT DETECTED Final   Plesimonas shigelloides NOT DETECTED NOT DETECTED Final   Salmonella species NOT DETECTED NOT DETECTED Final   Yersinia enterocolitica NOT DETECTED NOT DETECTED Final   Vibrio species NOT DETECTED NOT DETECTED Final   Vibrio cholerae NOT DETECTED NOT DETECTED Final   Enteroaggregative E coli (EAEC) NOT DETECTED NOT DETECTED Final   Enteropathogenic E coli (EPEC) NOT DETECTED NOT DETECTED Final   Enterotoxigenic E coli (ETEC) NOT DETECTED NOT DETECTED Final   Shiga like toxin producing E coli (STEC) NOT DETECTED NOT DETECTED Final   E. coli O157 NOT DETECTED NOT DETECTED Final   Shigella/Enteroinvasive E coli (EIEC) NOT DETECTED NOT DETECTED Final   Cryptosporidium NOT DETECTED NOT DETECTED Final   Cyclospora cayetanensis  NOT DETECTED NOT DETECTED Final   Entamoeba histolytica NOT DETECTED NOT DETECTED Final   Giardia lamblia NOT DETECTED NOT DETECTED Final   Adenovirus F40/41 NOT DETECTED NOT DETECTED Final   Astrovirus NOT DETECTED NOT DETECTED Final   Norovirus GI/GII NOT DETECTED NOT DETECTED Final   Rotavirus A NOT DETECTED NOT DETECTED Final   Sapovirus (I, II, IV, and V) NOT DETECTED NOT DETECTED Final  C difficile quick scan w PCR reflex     Status: None   Collection Time: 05/28/16  2:40 AM  Result Value Ref Range Status   C Diff antigen NEGATIVE NEGATIVE Final   C Diff toxin NEGATIVE NEGATIVE Final   C Diff interpretation No C. difficile detected.  Final  Culture, blood (routine x 2)     Status: None (Preliminary result)   Collection Time: 05/28/16  5:05 PM  Result Value Ref Range Status   Specimen Description BLOOD LEFT HAND  Final   Special Requests IN PEDIATRIC BOTTLE 2CC  Final   Culture NO GROWTH 3 DAYS  Final   Report Status PENDING  Incomplete  Culture, blood (routine x 2)     Status: None (Preliminary result)   Collection Time: 05/28/16  5:26 PM  Result Value Ref Range Status   Specimen Description BLOOD RIGHT HAND  Final   Special Requests IN PEDIATRIC BOTTLE 0.5CC  Final   Culture NO GROWTH 3 DAYS  Final   Report Status PENDING  Incomplete     Scheduled Meds: . aspirin EC  81 mg Oral Daily  . cefTRIAXone (ROCEPHIN)  IV  2 g Intravenous Q24H  . enoxaparin (LOVENOX) injection  40 mg Subcutaneous Q24H  . folic acid  1 mg Oral Daily  . furosemide  20 mg Oral Daily  . levothyroxine  25 mcg Intravenous Daily  . metoprolol tartrate  12.5 mg Oral BID  . metroNIDAZOLE  500 mg Oral  Q8H  . multivitamin with minerals  1 tablet Oral Daily  . pantoprazole  40 mg Oral Daily  . sucralfate  1 g Oral TID WC & HS  . thiamine  100 mg Oral Daily  . traMADol  50 mg Oral TID  . traZODone  100 mg Oral QHS   Continuous Infusions: . sodium chloride 10 mL/hr at 06/01/16 0330     LOS: 4 days    Cherene Altes, MD [facility] Office  [phone] Pager - Text Page per Shea Evans as per below:  On-Call/Text Page:      [email or web address]      password [email or web address]  If 7PM-7AM, please contact night-coverage [email or web address] Password [facility] 06/01/2016, 3:37 PM

## 2016-06-02 DIAGNOSIS — R5381 Other malaise: Secondary | ICD-10-CM

## 2016-06-02 DIAGNOSIS — F1010 Alcohol abuse, uncomplicated: Secondary | ICD-10-CM

## 2016-06-02 LAB — CBC
HCT: 36 % — ABNORMAL LOW (ref 39.0–52.0)
Hemoglobin: 11.8 g/dL — ABNORMAL LOW (ref 13.0–17.0)
MCH: 35 pg — ABNORMAL HIGH (ref 26.0–34.0)
MCHC: 32.8 g/dL (ref 30.0–36.0)
MCV: 106.8 fL — ABNORMAL HIGH (ref 78.0–100.0)
PLATELETS: 90 10*3/uL — AB (ref 150–400)
RBC: 3.37 MIL/uL — ABNORMAL LOW (ref 4.22–5.81)
RDW: 18 % — AB (ref 11.5–15.5)
WBC: 3.1 10*3/uL — AB (ref 4.0–10.5)

## 2016-06-02 LAB — COMPREHENSIVE METABOLIC PANEL
ALBUMIN: 1.5 g/dL — AB (ref 3.5–5.0)
ALT: 29 U/L (ref 17–63)
AST: 93 U/L — AB (ref 15–41)
Alkaline Phosphatase: 98 U/L (ref 38–126)
Anion gap: 4 — ABNORMAL LOW (ref 5–15)
CO2: 27 mmol/L (ref 22–32)
Calcium: 7.7 mg/dL — ABNORMAL LOW (ref 8.9–10.3)
Chloride: 109 mmol/L (ref 101–111)
Creatinine, Ser: 0.71 mg/dL (ref 0.61–1.24)
GFR calc Af Amer: >60 mL/min (ref >60)
GLUCOSE: 93 mg/dL (ref 65–99)
POTASSIUM: 3.4 mmol/L — AB (ref 3.5–5.1)
SODIUM: 140 mmol/L (ref 135–145)
Total Bilirubin: 0.9 mg/dL (ref 0.3–1.2)
Total Protein: 5.5 g/dL — ABNORMAL LOW (ref 6.5–8.1)

## 2016-06-02 LAB — MAGNESIUM: MAGNESIUM: 1.6 mg/dL — AB (ref 1.7–2.4)

## 2016-06-02 LAB — CULTURE, BLOOD (ROUTINE X 2)
CULTURE: NO GROWTH
Culture: NO GROWTH

## 2016-06-02 MED ORDER — VITAMIN B-12 1000 MCG PO TABS
1000.0000 ug | ORAL_TABLET | Freq: Every day | ORAL | Status: DC
  Administered 2016-06-02 – 2016-06-03 (×2): 1000 ug via ORAL
  Filled 2016-06-02 (×4): qty 1

## 2016-06-02 MED ORDER — SPIRONOLACTONE 25 MG PO TABS
12.5000 mg | ORAL_TABLET | Freq: Every day | ORAL | Status: DC
  Administered 2016-06-02: 12.5 mg via ORAL
  Filled 2016-06-02: qty 1

## 2016-06-02 MED ORDER — MAGNESIUM SULFATE 2 GM/50ML IV SOLN
2.0000 g | Freq: Once | INTRAVENOUS | Status: AC
  Administered 2016-06-02: 2 g via INTRAVENOUS
  Filled 2016-06-02: qty 50

## 2016-06-02 MED ORDER — METOPROLOL TARTRATE 12.5 MG HALF TABLET
12.5000 mg | ORAL_TABLET | Freq: Two times a day (BID) | ORAL | Status: DC
  Administered 2016-06-02 – 2016-06-03 (×2): 12.5 mg via ORAL
  Filled 2016-06-02 (×2): qty 1

## 2016-06-02 MED ORDER — POTASSIUM CHLORIDE CRYS ER 20 MEQ PO TBCR
40.0000 meq | EXTENDED_RELEASE_TABLET | Freq: Once | ORAL | Status: AC
  Administered 2016-06-02: 40 meq via ORAL
  Filled 2016-06-02: qty 2

## 2016-06-02 NOTE — Evaluation (Signed)
Physical Therapy Evaluation Patient Details Name: Nathan Frank MRN: [id number] DOB: 06-12-1941 Today's Date: 06/02/2016   History of Present Illness  Nathan Frank is a 75 y.o. male with alcohol abuse, atrial fibrillation, cirrhosis of the liver, hepatitis C present to the ER because of weakness and diarrhea  Clinical Impression  Patient presents with decreased independence with mobility due to swelling, weakness and poor endurance as well as other issues listed in PT problem list.  He will benefit from skilled PT in the acute setting to allow return home with family support and follow up HHPT.  Discussed SNF rehab, but pt not willing to go and reports son will also come to assist.    Follow Up Recommendations Home health PT;Supervision/Assistance - 24 hour    Equipment Recommendations  3in1 (PT)    Recommendations for Other Services       Precautions / Restrictions Precautions Precautions: Fall      Mobility  Bed Mobility Overal bed mobility: Needs Assistance Bed Mobility: Supine to Sit     Supine to sit: HOB elevated;Mod assist     General bed mobility comments: assisted to lift trunk and scoot forward on pad  Transfers Overall transfer level: Needs assistance Equipment used: Rolling walker (2 wheeled) Transfers: Sit to/from Stand Sit to Stand: From elevated surface;Max assist;Mod assist         General transfer comment: increased time, multiple attempts from EOB prior to achieving upright posture, still flexed, up from 3:1 over toilet with less assist, cues for hand placement  Ambulation/Gait Ambulation/Gait assistance: Min assist Ambulation Distance (Feet): 12 Feet (x2) Assistive device: Rolling walker (2 wheeled) Gait Pattern/deviations: Step-to pattern;Wide base of support;Shuffle;Decreased stride length     General Gait Details: very short steps with wide BOS due to swelling in LE's and scrotum; flexed and rotated trunk, min A for safety, for  maneuvering walker safely and for balance  Stairs            Wheelchair Mobility    Modified Rankin (Stroke Patients Only)       Balance Overall balance assessment: Needs assistance Sitting-balance support: Feet unsupported Sitting balance-Leahy Scale: Poor Sitting balance - Comments: leaning posteriorly and needing cues for safety   Standing balance support: Bilateral upper extremity supported Standing balance-Leahy Scale: Poor Standing balance comment: UE support for safety and need for min A for safety                             Pertinent Vitals/Pain Pain Assessment: Faces Faces Pain Scale: Hurts little more Pain Location: no pain at rest, but feet tender to touch Pain Descriptors / Indicators: Sore Pain Intervention(s): Monitored during session;Limited activity within patient's tolerance    Home Living Family/patient expects to be discharged to:: Private residence Living Arrangements: Spouse/significant other Available Help at Discharge: Family;Available 24 hours/day Type of Home: House Home Access: Stairs to enter Entrance Stairs-Rails: Right Entrance Stairs-Number of Steps: 4 Home Layout: One level Home Equipment: Cane - single point;Grab bars - tub/shower;Hand held shower head;Walker - 2 wheels Additional Comments: States his son coming from [place] to help out as well    Prior Function Level of Independence: Independent with assistive device(s)         Comments: Uses SPC for community mobility     Hand Dominance   Dominant Hand: Right    Extremity/Trunk Assessment   Upper Extremity Assessment: Defer to OT evaluation  Lower Extremity Assessment: RLE deficits/detail;LLE deficits/detail RLE Deficits / Details: AROM limited due to edema throughout LE's (especiallly knee flexion limited); strength hip flexion 4-/5, knee extension 4/5, ankle DF 4/5 LLE Deficits / Details: AROM limited due to edema throughout LE's  (especiallly knee flexion limited); strength hip flexion 4-/5, knee extension 4/5, ankle DF 4/5  Cervical / Trunk Assessment: Other exceptions  Communication   Communication: No difficulties  Cognition Arousal/Alertness: Awake/alert Behavior During Therapy: Flat affect Overall Cognitive Status: No family/caregiver present to determine baseline cognitive functioning                      General Comments General comments (skin integrity, edema, etc.): Patient toileted, +BM and needed assist for hygiene in standing and was soiled with urine in the bed    Exercises General Exercises - Lower Extremity Ankle Circles/Pumps: AROM;Both;5 reps;Supine Heel Slides: AAROM;Both;5 reps;Supine      Assessment/Plan    PT Assessment Patient needs continued PT services  PT Diagnosis Difficulty walking;Generalized weakness   PT Problem List Decreased mobility;Decreased activity tolerance;Decreased strength;Decreased balance;Decreased knowledge of use of DME;Decreased range of motion  PT Treatment Interventions DME instruction;Gait training;Stair training;Functional mobility training;Therapeutic exercise;Therapeutic activities;Balance training;Patient/family education   PT Goals (Current goals can be found in the Care Plan section) Acute Rehab PT Goals Patient Stated Goal: To go home PT Goal Formulation: With patient Time For Goal Achievement: 06/09/16 Potential to Achieve Goals: Good    Frequency Min 3X/week   Barriers to discharge        Co-evaluation               End of Session Equipment Utilized During Treatment: Gait belt Activity Tolerance: Patient limited by fatigue Patient left: in chair;with call bell/phone within reach;with chair alarm set           Time: [id number] PT Time Calculation (min) (ACUTE ONLY): 31 min   Charges:   PT Evaluation $PT Eval Moderate Complexity: 1 Procedure PT Treatments $Gait Training: 8-22 mins   PT G CodesReginia Naas 2016-06-06, 10:21 AM Magda Kiel, [place] 06/06/2016

## 2016-06-02 NOTE — Plan of Care (Signed)
Problem: Nutrition: Goal: Adequate nutrition will be maintained Outcome: Adequate for Discharge Ordered Ensure do to poor po intake of meals on 06/01/16

## 2016-06-02 NOTE — Progress Notes (Signed)
Progress Note   Nathan Frank  [id number] DOB: [birth year] DOA: 05/27/2016 PCP: Thressa Sheller, MD    Brief Narrative:  75 y.o. MHx Depressive disorder, polysubstance abuse (Heroin, Marijuana), EtOH abuse, liver cirrhosis, hepatitis C, NSTEMI, Atrial Fibrillation, Chronic Diastolic CHF, Prolonged QT, Upper GI bleeding, and DVT who presented to the ER w/ weakness and diarrhea. Patient stated he had been feeling fatigued and weak over 2 days with constant diarrhea over 1 week. Denied sick contacts or recent travel. Admitted he had not taken any meds for 1 week.  Patient was tachycardic in the 130s. Patient states he has not taken any of his medications last 1 week.   Subjective: The patient is resting quietly in bed.  Denies CP and SOB. reports loose stools episodes has improved significantly and is tolerating diet better. No fever. Patient endorses feeling weak.  Assessment & Plan:  Persistent Diarrhea- ?infective colitis  - Negative C. difficile and GI pathogen panel -CT abdomen and pelvis; liver cirrhosis, thickening of cecum and ascending colon  -completed antibiotic therapy on 06/01/16 -Alpha-fetoprotein, normal -CA 19-9 is 45 -Requested PICC line placement secondary to chronic illness and requirement for extensive IV medications and lab checks -After speaking with Dr. Fuller Plan from [place] felt it may be beneficial to at least obtain abdominal MRI to evaluate pancreas further. -8/27 Abdominal MRI; nondiagnostic for any lesions concerning for cancer. Schedule follow-up in 1-2 weeks after discharge with Dr. Lucio Edward for completion of GI workup of Chronic diarrhea and liver cirrhosis. -patient is stable currently and reports loose stools has slow down a lot.  Alcohol abuse -states his last drink was 4 days before admit  -does not appear to be in florid withdrawal at this time  -continue to monitor -continue folic acid and thiamine    Hepatitis C -advise to be compliant with medications and stop alcohol consumption  -8/26 Dr. Sherral Hammers spoke with Dr. Bobby Rumpf who suggested we schedule an establish care appointment with Dr. Bobby Rumpf in 1-2 weeks after discharge for hepatitis C infection  EtOH liver cirrhosis -pt has been counseled on the absolute need to comply w/ his med regimen, and to avoid EtOH consumption -Tbil is normal - crt is normal  -will continue b-blockers, resume spironolactone and continue lasix  Generalized weakness -due to diarrhea and dehydration - PT/OT has recommended SNF; patient not convinced but agreeable for short term -continue supportive care; hopefully discharge in 1-2 days.  Hypokalemia and hypomagnesemia  -Due to GI losses and poor intake -will continue repletion as needed -might need daily maintenance supplementation; will follow levels trend   QT prolongation -Correct electrolytes and follow on telemetry   Paroxysmal A. Fib -CHA2DS2 - VASc is 4 - not on anticoagulants secondary to history of GI bleed and alcohol abuse -Ongoing use of amiodarone in this alcoholic with liver disease is ill advised - patient is noncompliant with his medications regardless -will continue metoprolol for rate control  Chronic diastolic CHF/dilated cardiomyopathy -No evidence of gross volume overload at present  -will resume spironolactone and continue lasix for volume control -daily weights and strict intake/output  Filed Weights   05/31/16 0414 06/01/16 0339 06/02/16 0533  Weight: 109.9 kg (242 lb 4.6 oz) 110 kg (242 lb 8.1 oz) 115.3 kg (254 lb 3.1 oz)    Thrombocytopeniaand macrocytosis  -due to EtOH abuse/cirrhosis -no signs of bleeding currently -will continue folic acid and [id number]  Hypothyroidism -8/23 TSH 29.9 -started on synthroid  -will need thyroid profile follow up  in 4-6 weeks   DVT prophylaxis: lovenox Code Status: FULL CODE Family Communication: no family  present at time of exam; wife contacted (but didn't pick phone up). Disposition Plan: follow up with GI as an output; patient seen by PT/OT recommending SNF vs 24/7 care and [facility] services. Electrolytes still off  Consultants:  none  Procedures: none  Antimicrobials:  Ceftriaxone 8/23 > 8/28 Metronidazole 8/23 > 8/28  Objective: Blood pressure 122/85, pulse (!) 103, temperature 97.7 F (36.5 C), temperature source Oral, resp. rate 20, height 6' (1.829 m), weight 115.3 kg (254 lb 3.1 oz), SpO2 99 %.  Intake/Output Summary (Last 24 hours) at 06/02/16 1644 Last data filed at 06/02/16 1336  Gross per 24 hour  Intake              460 ml  Output              200 ml  Net              260 ml   Filed Weights   05/31/16 0414 06/01/16 0339 06/02/16 0533  Weight: 109.9 kg (242 lb 4.6 oz) 110 kg (242 lb 8.1 oz) 115.3 kg (254 lb 3.1 oz)    Examination: General: No acute respiratory distress; denies CP, palpitations and diaphoresis. Lungs: Clear to auscultation bilaterally without wheezes or crackles Cardiovascular: mild tachycardic, sinus currently; no rubs, no gallops.  Abdomen: Nontender, nondistended, soft, bowel sounds positive, no significant ascites, no appreciable mass Extremities: trace to 1 + edema bilaterally, no cyanosis   CBC:  Recent Labs Lab 05/28/16 0953 05/29/16 0851 05/29/16 1614 05/30/16 0255 05/31/16 0437 06/01/16 0338 06/02/16 0355  WBC 3.6* 2.8* 3.6* 4.5 3.1* 2.8* 3.1*  NEUTROABS 2.1 1.5*  --  3.1 1.8 1.3*  --   HGB 13.0 12.5* 12.7* 12.2* 11.4* 11.4* 11.8*  HCT 38.1* 37.5* 39.1 35.3* 35.2* 34.7* 36.0*  MCV 104.1* 106.8* 107.7* 107.0* 108.0* 106.8* 106.8*  PLT 83* 9* 84* 137* 83* 80* 90*   Basic Metabolic Panel:  Recent Labs Lab 05/28/16 0953  05/30/16 0255 05/30/16 1441 05/31/16 0437 06/01/16 0338 06/02/16 0355  NA 138  < > 139 139 140 139 140  K 3.8  < > 5.0 3.9 4.0 3.6 3.4*  CL 112*  < > 118* 111 115* 112* 109  CO2 22  < > 17* 28 24 25 27     GLUCOSE 122*  < > 99 99 98 93 93  BUN <5*  < > <5* <5* <5* <5* <5*  CREATININE 0.85  < > 0.79 0.79 0.82 0.78 0.71  CALCIUM 7.1*  < > 7.1* 6.6* 7.5* 7.4* 7.7*  MG 1.9  < > 2.0 1.7 1.7 1.5* 1.6*  PHOS 2.8  --   --   --   --   --   --   < > = values in this interval not displayed.   GFR: Estimated Creatinine Clearance: 104.6 mL/min (by C-G formula based on SCr of 0.8 mg/dL).  Liver Function Tests:  Recent Labs Lab 05/29/16 0851 05/30/16 0255 05/31/16 0437 06/01/16 0338 06/02/16 0355  AST 116* 118* 102* 92* 93*  ALT 34 33 30 27 29   ALKPHOS 103 100 83 92 98  BILITOT 0.5 1.4* 1.1 0.9 0.9  PROT 5.2* 5.3* 4.8* 4.8* 5.5*  ALBUMIN 1.6* 1.6* 1.6* 1.5* 1.5*    Recent Labs Lab 05/27/16 1348 05/28/16 0953 05/29/16 0851  LIPASE 16 19 18     Recent Labs Lab 05/29/16 [id number] 06/01/16 [id number]  AMMONIA 102* 60*    Coagulation Profile:  Recent Labs Lab 05/29/16 0643 06/01/16 0338  INR 1.76 1.64    Cardiac Enzymes:  Recent Labs Lab 05/27/16 2257 05/28/16 0953  TROPONINI 0.03* 0.03*    HbA1C: Hgb A1c MFr Bld  Date/Time Value Ref Range Status  04/19/2016 02:12 AM 4.6 (L) 4.8 - 5.6 % Final    Comment:    (NOTE)         Pre-diabetes: 5.7 - 6.4         Diabetes: >6.4         Glycemic control for adults with diabetes: <7.0      Recent Results (from the past 240 hour(s))  MRSA PCR Screening     Status: None   Collection Time: 05/27/16  8:38 PM  Result Value Ref Range Status   MRSA by PCR NEGATIVE NEGATIVE Final    Comment:        The GeneXpert MRSA Assay (FDA approved for NASAL specimens only), is one component of a comprehensive MRSA colonization surveillance program. It is not intended to diagnose MRSA infection nor to guide or monitor treatment for MRSA infections.   Gastrointestinal Panel by PCR , Stool     Status: None   Collection Time: 05/28/16  2:40 AM  Result Value Ref Range Status   Campylobacter species NOT DETECTED NOT DETECTED Final    Plesimonas shigelloides NOT DETECTED NOT DETECTED Final   Salmonella species NOT DETECTED NOT DETECTED Final   Yersinia enterocolitica NOT DETECTED NOT DETECTED Final   Vibrio species NOT DETECTED NOT DETECTED Final   Vibrio cholerae NOT DETECTED NOT DETECTED Final   Enteroaggregative E coli (EAEC) NOT DETECTED NOT DETECTED Final   Enteropathogenic E coli (EPEC) NOT DETECTED NOT DETECTED Final   Enterotoxigenic E coli (ETEC) NOT DETECTED NOT DETECTED Final   Shiga like toxin producing E coli (STEC) NOT DETECTED NOT DETECTED Final   E. coli O157 NOT DETECTED NOT DETECTED Final   Shigella/Enteroinvasive E coli (EIEC) NOT DETECTED NOT DETECTED Final   Cryptosporidium NOT DETECTED NOT DETECTED Final   Cyclospora cayetanensis NOT DETECTED NOT DETECTED Final   Entamoeba histolytica NOT DETECTED NOT DETECTED Final   Giardia lamblia NOT DETECTED NOT DETECTED Final   Adenovirus F40/41 NOT DETECTED NOT DETECTED Final   Astrovirus NOT DETECTED NOT DETECTED Final   Norovirus GI/GII NOT DETECTED NOT DETECTED Final   Rotavirus A NOT DETECTED NOT DETECTED Final   Sapovirus (I, II, IV, and V) NOT DETECTED NOT DETECTED Final  C difficile quick scan w PCR reflex     Status: None   Collection Time: 05/28/16  2:40 AM  Result Value Ref Range Status   C Diff antigen NEGATIVE NEGATIVE Final   C Diff toxin NEGATIVE NEGATIVE Final   C Diff interpretation No C. difficile detected.  Final  Culture, blood (routine x 2)     Status: None   Collection Time: 05/28/16  5:05 PM  Result Value Ref Range Status   Specimen Description BLOOD LEFT HAND  Final   Special Requests IN PEDIATRIC BOTTLE 2CC  Final   Culture NO GROWTH 5 DAYS  Final   Report Status 06/02/2016 FINAL  Final  Culture, blood (routine x 2)     Status: None   Collection Time: 05/28/16  5:26 PM  Result Value Ref Range Status   Specimen Description BLOOD RIGHT HAND  Final   Special Requests IN PEDIATRIC BOTTLE 0.5CC  Final   Culture NO GROWTH  5 DAYS   Final   Report Status 06/02/2016 FINAL  Final     Scheduled Meds: . aspirin EC  81 mg Oral Daily  . enoxaparin (LOVENOX) injection  40 mg Subcutaneous Q24H  . feeding supplement (ENSURE ENLIVE)  237 mL Oral BID BM  . folic acid  1 mg Oral Daily  . furosemide  20 mg Oral Daily  . levothyroxine  50 mcg Oral QAC breakfast  . metoprolol tartrate  12.5 mg Oral BID  . multivitamin with minerals  1 tablet Oral Daily  . pantoprazole  40 mg Oral Daily  . spironolactone  12.5 mg Oral Daily  . sucralfate  1 g Oral TID WC & HS  . thiamine  100 mg Oral Daily  . traMADol  50 mg Oral TID  . traZODone  100 mg Oral QHS  . vitamin B-12  1,000 mcg Oral Daily   Continuous Infusions: . sodium chloride 10 mL/hr at 06/01/16 0330     LOS: 5 days   Barton Dubois MD [phone] [facility]  06/02/2016, 4:44 PM

## 2016-06-02 NOTE — Evaluation (Signed)
Occupational Therapy Evaluation Patient Details Name: Nathan Frank MRN: [id number] DOB: 22-Dec-1940 Today's Date: 06/02/2016    History of Present Illness Nathan Frank is a 75 y.o. male with alcohol abuse, atrial fibrillation, cirrhosis of the liver, hepatitis C present to the ER because of weakness and diarrhea   Clinical Impression   PTA, pt reports he was independent with all basic ADLs and used [facility] for mobility. Pt currently presents with generalized weakness, balance deficits, and decreased safety awareness Pt required mod +2 assist for bed mobility, LB ADLs, and basic transfers. Pt plans to d/c home with 24/7 assistance from his wife, but unsure if pt's wife is able to provide necessary level of physical assistance that pt currently requires. Currently recommend SNF for post-acute rehab. Pt will benefit from continued acute OT to maximize independence and safety with ADLs and mobility.     Follow Up Recommendations  SNF;Supervision/Assistance - 24 hour - may progress to [place] Recommendations  Other (comment) (TBD at next venue of care)    Recommendations for Other Services       Precautions / Restrictions Precautions Precautions: Fall Restrictions Weight Bearing Restrictions: No      Mobility Bed Mobility Overal bed mobility: Needs Assistance Bed Mobility: Sit to Supine;Rolling Rolling: Mod assist     Sit to supine: Mod assist;+2 for physical assistance   General bed mobility comments: Assist for trunk support for controlled descent onto bed and to move bilateral LE onto bed. Pt with decreased initiation and required VCs for hand placement.   Transfers Overall transfer level: Needs assistance Equipment used: Rolling walker (2 wheeled) Transfers: Sit to/from [facility] Sit to Stand: Mod assist;+2 physical assistance Stand pivot transfers: Min assist;+2 physical assistance       General transfer comment: Heavy assistance required  for boost to stand from low-sitting recliner as pt unable to complete this transfer with assist of one person. VCs to bend knees, lean forward, and push from chair with BUE. Assist to bring hips forward once in standing and to stabilize balance.    Balance Overall balance assessment: Needs assistance Sitting-balance support: No upper extremity supported;Feet supported Sitting balance-Leahy Scale: Poor Sitting balance - Comments: leaning posteriorly and needing cues for safety Postural control: Posterior lean Standing balance support: Bilateral upper extremity supported;During functional activity Standing balance-Leahy Scale: Poor Standing balance comment: Reliant on BUE support to maintain balance at all times                            ADL Overall ADL's : Needs assistance/impaired Eating/Feeding: Set up;Sitting   Grooming: Wash/dry hands;Wash/dry face;Set up;Sitting   Upper Body Bathing: Minimal assitance;Sitting   Lower Body Bathing: Moderate assistance;+2 for physical assistance;Sit to/from stand   Upper Body Dressing : Minimal assistance;Sitting   Lower Body Dressing: Moderate assistance;+2 for physical assistance;Sit to/from stand   Toilet Transfer: Moderate assistance;+2 for physical assistance;Stand-pivot;RW Toilet Transfer Details (indicate cue type and reason): simulated from chair to bed Toileting- Clothing Manipulation and Hygiene: Maximal assistance;+2 for physical assistance;Sit to/from stand       Functional mobility during ADLs: Moderate assistance;+2 for physical assistance;Rolling walker General ADL Comments: Pt very deconditioned and unable to transfer from low-sitting surfaces without +2 assistance     Vision Vision Assessment?: No apparent visual deficits   Perception     Praxis      Pertinent Vitals/Pain Pain Assessment: Faces Faces Pain Scale: Hurts little  more Pain Location: R side of abdomen Pain Descriptors / Indicators: Sore Pain  Intervention(s): Limited activity within patient's tolerance;Repositioned;Monitored during session     Hand Dominance Right   Extremity/Trunk Assessment Upper Extremity Assessment Upper Extremity Assessment: Generalized weakness   Lower Extremity Assessment Lower Extremity Assessment: Defer to PT evaluation   Cervical / Trunk Assessment Cervical / Trunk Assessment: Other exceptions Cervical / Trunk Exceptions: trunk flexed with L hip higher and some rotation in lower spine   Communication Communication Communication: No difficulties   Cognition Arousal/Alertness: Awake/alert Behavior During Therapy: Flat affect Overall Cognitive Status: No family/caregiver present to determine baseline cognitive functioning                     General Comments       Exercises       Shoulder Instructions      Home Living Family/patient expects to be discharged to:: Private residence Living Arrangements: Spouse/significant other Available Help at Discharge: Family;Available 24 hours/day Type of Home: House Home Access: Stairs to enter [employer] of Steps: 4 Entrance Stairs-Rails: Right Home Layout: One level     Bathroom Shower/Tub: Occupational psychologist: Handicapped height     Home Equipment: [place] - single point;Grab bars - tub/shower;Hand held Tourist information centre manager - 2 wheels   Additional Comments: States his son coming from [place] to help out as well      Prior Functioning/Environment Level of Independence: Independent with assistive device(s)        Comments: Uses SPC for community mobility    OT Diagnosis: Generalized weakness;Acute pain   OT Problem List: Decreased strength;Decreased range of motion;Decreased activity tolerance;Impaired balance (sitting and/or standing);Decreased knowledge of use of DME or AE;Decreased safety awareness;Obesity;Pain;Cardiopulmonary status limiting activity   OT Treatment/Interventions: Self-care/ADL  training;Therapeutic exercise;DME and/or AE instruction;Therapeutic activities;Patient/family education;Balance training    OT Goals(Current goals can be found in the care plan section) Acute Rehab OT Goals Patient Stated Goal: To go home OT Goal Formulation: With patient Time For Goal Achievement: 06/16/16 Potential to Achieve Goals: Good ADL Goals Pt Will Perform Grooming: with supervision;standing Pt Will Perform Upper Body Bathing: with set-up;sitting Pt Will Perform Lower Body Bathing: with min guard assist;sit to/from stand Pt Will Transfer to Toilet: with supervision;ambulating;bedside commode (over toilet) Pt Will Perform Toileting - Clothing Manipulation and hygiene: sit to/from stand;with min guard assist  OT Frequency: Min 2X/week   Barriers to D/C: Decreased caregiver support  Unsure if pt's wife can provide necessary level of physical assistance       Co-evaluation              End of Session Equipment Utilized During Treatment: Gait belt;Rolling walker Nurse Communication: Mobility status  Activity Tolerance: Patient limited by fatigue Patient left: in bed;with call bell/phone within reach;with nursing/sitter in room   Time: [id number] OT Time Calculation (min): 32 min Charges:  OT General Charges $OT Visit: 1 Procedure OT Evaluation $OT Eval Moderate Complexity: 1 Procedure OT Treatments $Self Care/Home Management : 8-22 mins G-Codes:    Redmond Baseman, OTR/L [phone] 06/02/2016, 3:03 PM

## 2016-06-03 DIAGNOSIS — R197 Diarrhea, unspecified: Secondary | ICD-10-CM

## 2016-06-03 DIAGNOSIS — E860 Dehydration: Principal | ICD-10-CM

## 2016-06-03 DIAGNOSIS — F10239 Alcohol dependence with withdrawal, unspecified: Secondary | ICD-10-CM

## 2016-06-03 DIAGNOSIS — R531 Weakness: Secondary | ICD-10-CM

## 2016-06-03 DIAGNOSIS — E876 Hypokalemia: Secondary | ICD-10-CM

## 2016-06-03 DIAGNOSIS — K7031 Alcoholic cirrhosis of liver with ascites: Secondary | ICD-10-CM

## 2016-06-03 DIAGNOSIS — I480 Paroxysmal atrial fibrillation: Secondary | ICD-10-CM

## 2016-06-03 LAB — BASIC METABOLIC PANEL
Anion gap: 5 (ref 5–15)
BUN: <5 mg/dL — ABNORMAL LOW (ref 6–20)
CALCIUM: 8.1 mg/dL — AB (ref 8.9–10.3)
CHLORIDE: 107 mmol/L (ref 101–111)
CO2: 29 mmol/L (ref 22–32)
CREATININE: 0.63 mg/dL (ref 0.61–1.24)
GFR calc non Af Amer: >60 mL/min (ref >60)
GLUCOSE: 84 mg/dL (ref 65–99)
Potassium: 3.6 mmol/L (ref 3.5–5.1)
Sodium: 141 mmol/L (ref 135–145)

## 2016-06-03 LAB — MAGNESIUM: Magnesium: 1.6 mg/dL — ABNORMAL LOW (ref 1.7–2.4)

## 2016-06-03 MED ORDER — LEVOTHYROXINE SODIUM 50 MCG PO TABS: 50.0000 ug | ORAL_TABLET | Freq: Every day | ORAL | 0 refills | Status: DC

## 2016-06-03 MED ORDER — SACCHAROMYCES BOULARDII 250 MG PO CAPS: 250.0000 mg | ORAL_CAPSULE | Freq: Two times a day (BID) | ORAL | 0 refills | Status: DC

## 2016-06-03 MED ORDER — MAGNESIUM SULFATE 2 GM/50ML IV SOLN
2.0000 g | Freq: Three times a day (TID) | INTRAVENOUS | Status: DC
  Administered 2016-06-03: 2 g via INTRAVENOUS
  Filled 2016-06-03 (×2): qty 50

## 2016-06-03 MED ORDER — SPIRONOLACTONE 25 MG PO TABS
50.0000 mg | ORAL_TABLET | Freq: Every day | ORAL | Status: DC
  Administered 2016-06-03: 50 mg via ORAL
  Filled 2016-06-03: qty 2

## 2016-06-03 MED ORDER — SPIRONOLACTONE 50 MG PO TABS: 50.0000 mg | ORAL_TABLET | Freq: Every day | ORAL | 0 refills | Status: DC

## 2016-06-03 MED ORDER — POTASSIUM CHLORIDE CRYS ER 20 MEQ PO TBCR
40.0000 meq | EXTENDED_RELEASE_TABLET | Freq: Once | ORAL | Status: AC
  Administered 2016-06-03: 40 meq via ORAL
  Filled 2016-06-03: qty 2

## 2016-06-03 MED ORDER — MAGNESIUM OXIDE 400 (241.3 MG) MG PO TABS: 400.0000 mg | ORAL_TABLET | Freq: Every day | ORAL | 0 refills | Status: DC

## 2016-06-03 NOTE — Discharge Summary (Signed)
Physician Discharge Summary  Nathan Frank [id number] DOB: 08-24-1941 DOA: 05/27/2016  PCP: Thressa Sheller, MD  Admit date: 05/27/2016 Discharge date: 06/03/2016  Admitted From: Home Disposition: Home  Recommendations for Outpatient Follow-up:  1. Follow up with PCP in 1-2 weeks 2. Please obtain BMP/CBC in one week 3. Check TSH in 4 weeks, started on Synthroid  Home Health: Yes Equipment/Devices: PT/OT/3 N 1  Discharge Condition: Stable CODE STATUS: Full Diet recommendation: Heart Healthy  Brief/Interim Summary: 75 y.o. MHx Depressive disorder, polysubstance abuse (Heroin, Marijuana),EtOH abuse, liver cirrhosis, hepatitis C, NSTEMI, Atrial Fibrillation, Chronic Diastolic CHF, Prolonged QT, Upper GI bleeding, and DVT who presented to the ER w/ weakness and diarrhea. Patient stated he had been feeling fatigued and weak over 2 days with constant diarrhea over 1 week. Denied sick contacts or recent travel. Admitted he had not taken any meds for 1 week.  Patient was tachycardic in the 130s. Patient states he has not taken any of his medications last 1 week.   Discharge Diagnoses:  Principal Problem:   Generalized weakness Active Problems:   Diarrhea   Alcohol withdrawal (HCC)   Hypokalemia   Dehydration   Paroxysmal atrial fibrillation (HCC)   Other specified hypothyroidism   Alcoholic cirrhosis of liver with ascites (HCC)   Persistent Diarrhea -Negative C. difficile and GI pathogen panel -CT abdomen and pelvis; liver cirrhosis, thickening of cecum and ascending colon  -completed antibiotic therapy on 06/01/16 -Alpha-fetoprotein, normal -CA 19-9is 45 -Requested PICC line placement secondary to chronic illness and requirement for extensive IV medications and lab checks -After speaking with Dr. Sterling Big [facility] felt it may be beneficial to at least obtain abdominal MRI to evaluate pancreas further. -8/27 Abdominal MRI; nondiagnostic for any lesions concerning for  cancer. Schedule follow-up in 1-2 weeks after discharge with Dr. Shari Heritage completion of GI workup of Chronic diarrhea and liver cirrhosis. -Patient reported he had one bowel movement since yesterday, this is seems resolved. -Discharged home on probiotic for one more month. Follow-up with GI as outpatient.  Alcohol abuse -states his last drink was 4 days before admit  -does not appear to be in florid withdrawal at this time  -continue to monitor -continue folic acid and thiamine   Hepatitis C -advise to be compliant with medications and stop alcohol consumption  -8/26 Dr. Sherral Hammers spoke with Dr. Caroll Rancher suggested we schedule an establish care appointment with Dr. Bobby Rumpf in 1-2 weeks after discharge for hepatitis C infection  EtOH liver cirrhosis -pt has been counseled on the absolute need to comply w/ his med regimen, and to avoid EtOH consumption -Tbil is normal - crt is normal  -will continue b-blockers, Lasix continued, Aldactone dose adjusted to 50 mg daily.  Generalized weakness -due to diarrhea and dehydration -OT recommended SNF, PT recommended home with home health service, discharged home.  Hypokalemia and hypomagnesemia  -Due to GI losses and poor intake -will continue repletion as needed -Placed on daily magnesium supplementation, Aldactone dose adjusted, likely this is well prevent hypokalemia.  QT prolongation -Correct electrolytes and follow on telemetry   Paroxysmal A. Fib -CHA2DS2 - VASc is 4 - not on anticoagulants secondary to history of GI bleed and alcohol abuse -Ongoing use of amiodarone in this alcoholic with liver disease is ill advised - patient is noncompliant with his medications regardless -will continue metoprolol for rate control  Chronic diastolic CHF/dilated cardiomyopathy -No evidence of gross volume overload at present  -will resume spironolactone and continue lasix for volume  control -daily weights and strict  intake/output  Thrombocytopeniaand macrocytosis -due to EtOH abuse/cirrhosis -no signs of bleeding currently -will continue folic acid and [id number]  Hypothyroidism -8/23 TSH 29.9 -Started on 50 g of Synthroid, check TSH in 4 weeks.  UTI -Urinalysis consistent with UTI and the time of admission, cultures were not done. -Patient was on Rocephin for 5 days.   Discharge Instructions  Discharge Instructions    Diet - low sodium heart healthy    Complete by:  As directed   Increase activity slowly    Complete by:  As directed       Medication List    TAKE these medications   amiodarone 200 MG tablet Commonly known as:  PACERONE Take 1 tablet (200 mg total) by mouth 2 (two) times daily.   folic acid 1 MG tablet Commonly known as:  FOLVITE Take 1 tablet (1 mg total) by mouth daily.   furosemide 40 MG tablet Commonly known as:  LASIX Take 0.5 tablets (20 mg total) by mouth daily.   levothyroxine 50 MCG tablet Commonly known as:  SYNTHROID, LEVOTHROID Take 1 tablet (50 mcg total) by mouth daily before breakfast.   magnesium oxide 400 (241.3 Mg) MG tablet Commonly known as:  MAG-OX Take 1 tablet (400 mg total) by mouth daily.   metoprolol tartrate 25 MG tablet Commonly known as:  LOPRESSOR Take 0.5 tablets (12.5 mg total) by mouth 2 (two) times daily. What changed:  when to take this   multivitamin with minerals Tabs tablet Take 1 tablet by mouth daily.   omeprazole 40 MG capsule Commonly known as:  PRILOSEC Take 1 capsule (40 mg total) by mouth daily. For acid reflux   saccharomyces boulardii 250 MG capsule Commonly known as:  FLORASTOR Take 1 capsule (250 mg total) by mouth 2 (two) times daily.   simethicone 80 MG chewable tablet Commonly known as:  MYLICON Chew 2 tablets (160 mg total) by mouth 4 (four) times daily as needed for flatulence.   spironolactone 50 MG tablet Commonly known as:  ALDACTONE Take 1 tablet (50 mg total) by mouth daily. What  changed:  medication strength  how much to take   sucralfate 1 g tablet Commonly known as:  CARAFATE Take 1 tablet (1 g total) by mouth 4 (four) times daily -  with meals and at bedtime.   thiamine 100 MG tablet Take 1 tablet (100 mg total) by mouth daily.   traMADol 50 MG tablet Commonly known as:  ULTRAM Take 50 mg by mouth 3 (three) times daily.   traZODone 100 MG tablet Commonly known as:  DESYREL Take 100 mg by mouth at bedtime.      Follow-up Information    Bobby Rumpf, MD. Schedule an appointment as soon as possible for a visit in 2 week(s).   Specialty:  Infectious Diseases Why:  Schedule a establish care appointment with Dr. Bobby Rumpf in 1-2 weeks for hepatitis C infection Contact information: [place] [address] [phone]        Pricilla Riffle. Fuller Plan, MD. Schedule an appointment as soon as possible for a visit in 2 week(s).   Specialty:  Gastroenterology Why:   Schedule follow-up in 1-2 weeks with Dr. Fuller Plan for completion of GI workup. Chronic diarrhea, liver cirrhosis. Contact information: [address] [place] [address] [phone]          No Known Allergies  Consultations:  None   Procedures/Studies: Mr Abdomen W  Wo Contrast  Result Date: 05/31/2016 CLINICAL DATA:  Diarrhea, cirrhosis. EXAM: MRI ABDOMEN WITHOUT AND WITH CONTRAST TECHNIQUE: Multiplanar multisequence MR imaging of the abdomen was performed both before and after the administration of intravenous contrast. CONTRAST:  40mL MULTIHANCE GADOBENATE DIMEGLUMINE 529 MG/ML IV SOLN COMPARISON:  CT 05/28/2016 FINDINGS: Lower chest: Small bilateral pleural effusions. Anasarca soft tissues the trunk Hepatobiliary: Marked hepatic steatosis noted on opposed phase imaging (series 8). Liver has a nodular contour and is shrunken. There is no focal hepatic lesion. Mild gallbladder wall thickening. Common bile duct is upper limits of normal at 6 mm common  hepatic duct similar 7 mm no filling defect within the common bile duct (series 4). Portal veins are patent. Splenic vein is patent. Venous collaterals the epigastric region along the esophagus. No ascites. Delayed contrast imaging demonstrates linear enhancement in the liver consistent with architectural distortion of fibrosis. No discrete enhancing lesion. Pancreas: No duct dilatation.  No abnormal enhancement Spleen: Normal volume. Adrenals/urinary tract: Adrenal glands and kidneys are normal. Stomach/Bowel: Epigastric varices. Stomach limited view of the bowel is unremarkable Vascular/Lymphatic: Abdominal aortic normal caliber. No retroperitoneal periportal lymphadenopathy. Musculoskeletal: No aggressive osseous lesion IMPRESSION: 1. Mild dilatation of the common hepatic duct and common bile duct without choledocholithiasis or obstruction evident. 2. No clear stones within the gallbladder. Sludge within the gallbladder. No gallbladder distension. 3. Morphologic changes in liver consistent cirrhosis. 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 No evidence hepatoma. No ascites. 4. Marked hepatic steatosis. 5. Small effusions and anasarca in the trunk. Electronically Signed   By: Suzy Bouchard M.D.   On: 05/31/2016 07:16   Ct Abdomen Pelvis W Contrast  Result Date: 05/28/2016 CLINICAL DATA:  Abdominal pain.  Diarrhea for 1 week. EXAM: CT ABDOMEN AND PELVIS WITH CONTRAST TECHNIQUE: Multidetector CT imaging of the abdomen and pelvis was performed using the standard protocol following bolus administration of intravenous contrast. CONTRAST:  182mL ISOVUE-300 IOPAMIDOL (ISOVUE-300) INJECTION 61% COMPARISON:  CT 04/18/2016 FINDINGS: Lower chest: Small pleural effusions, lost slightly larger than on prior CT. Adjacent atelectasis in the lower lobes. Liver: Diffusely decreased density consistent with steatosis. Liver parenchyma is diffusely heterogeneous. Probable fibrosis linear bands. Nodular  hepatic contours consistent with cirrhosis. Small amount of perihepatic fluid. Hepatobiliary: Multiple gallstones within physiologically distended gallbladder. Prominent common bile duct measuring 8-9 mm distally. No calcified choledocholithiasis. Pancreas: Parenchymal atrophy. No ductal dilatation or inflammation. Spleen: Normal.  No splenomegaly. Adrenal glands: No nodule. Kidneys: Symmetric renal enhancement. No hydronephrosis. Listen about delayed excretion on delayed phase imaging. Stomach/Bowel: Paraesophageal and mild perigastric varices. Stomach physiologically distended. There are no dilated or thickened small bowel loops. Wall thickening of the cecum and proximal ascending colon without surrounding fat stranding. Stool contrast throughout the remainder the colon. The appendix is normal. Vascular/Lymphatic: Prominent porta hepatis nodes. No retroperitoneal adenopathy. Abdominal aorta is normal in caliber. Atherosclerosis of the abdominal aorta without aneurysm. Reproductive: No acute abnormality. Bladder: Minimally distended without wall thickening. Other: Small volume intra-abdominal ascites tracking in the pelvis. No free air. No intra-abdominal abscess. Mild whole body wall edema, increased from prior CT. Musculoskeletal: There are no acute or suspicious osseous abnormalities. Degenerative change in the spine, stable. IMPRESSION: 1. Colonic wall thickening involving the cecum and proximal ascending colon. This may be colitis, infectious or inflammatory, however given cirrhosis, portal enteropathy could have this appearance. 2. Advanced cirrhosis with significant fatty infiltration of the liver. Peri-gastric and paraesophageal varices. Small volume of ascites. 3. Cholelithiasis without signs of gallbladder inflammation.  4. Small pleural effusions. Electronically Signed   By: Jeb Levering M.D.   On: 05/28/2016 02:53   Dg Chest Port 1 View  Result Date: 05/30/2016 CLINICAL DATA:  Line placement  EXAM: PORTABLE CHEST 1 VIEW COMPARISON:  04/28/2016 FINDINGS: Right PICC line is in place with the tip in the upper SVC. Heart is normal size. No confluent airspace opacities or effusions. No acute bony abnormality. IMPRESSION: Right PICC line tip in the upper SVC. No active disease. Electronically Signed   By: Rolm Baptise M.D.   On: 05/30/2016 11:14    (Echo, Carotid, EGD, Colonoscopy, ERCP)    Subjective:   Discharge Exam: Vitals:   06/02/16 1900 06/03/16 0500  BP: 115/78 104/83  Pulse: 77 83  Resp: 18 18  Temp: 97.5 F (36.4 C) 97.8 F (36.6 C)   Vitals:   06/02/16 0533 06/02/16 1335 06/02/16 1900 06/03/16 0500  BP: (!) 128/98 122/85 115/78 104/83  Pulse: 81 (!) 103 77 83  Resp: 17 20 18 18   Temp: 97.5 F (36.4 C) 97.7 F (36.5 C) 97.5 F (36.4 C) 97.8 F (36.6 C)  TempSrc: Oral Oral Oral Oral  SpO2: 100% 99% 98% 96%  Weight: 115.3 kg (254 lb 3.1 oz)   112.6 kg (248 lb 3.8 oz)  Height:        General: Pt is alert, awake, not in acute distress Cardiovascular: RRR, S1/S2 +, no rubs, no gallops Respiratory: CTA bilaterally, no wheezing, no rhonchi Abdominal: Soft, NT, ND, bowel sounds + Extremities: no edema, no cyanosis    The results of significant diagnostics from this hospitalization (including imaging, microbiology, ancillary and laboratory) are listed below for reference.     Microbiology: Recent Results (from the past 240 hour(s))  MRSA PCR Screening     Status: None   Collection Time: 05/27/16  8:38 PM  Result Value Ref Range Status   MRSA by PCR NEGATIVE NEGATIVE Final    Comment:        The GeneXpert MRSA Assay (FDA approved for NASAL specimens only), is one component of a comprehensive MRSA colonization surveillance program. It is not intended to diagnose MRSA infection nor to guide or monitor treatment for MRSA infections.   Gastrointestinal Panel by PCR , Stool     Status: None   Collection Time: 05/28/16  2:40 AM  Result Value Ref Range  Status   Campylobacter species NOT DETECTED NOT DETECTED Final   Plesimonas shigelloides NOT DETECTED NOT DETECTED Final   Salmonella species NOT DETECTED NOT DETECTED Final   Yersinia enterocolitica NOT DETECTED NOT DETECTED Final   Vibrio species NOT DETECTED NOT DETECTED Final   Vibrio cholerae NOT DETECTED NOT DETECTED Final   Enteroaggregative E coli (EAEC) NOT DETECTED NOT DETECTED Final   Enteropathogenic E coli (EPEC) NOT DETECTED NOT DETECTED Final   Enterotoxigenic E coli (ETEC) NOT DETECTED NOT DETECTED Final   Shiga like toxin producing E coli (STEC) NOT DETECTED NOT DETECTED Final   E. coli O157 NOT DETECTED NOT DETECTED Final   Shigella/Enteroinvasive E coli (EIEC) NOT DETECTED NOT DETECTED Final   Cryptosporidium NOT DETECTED NOT DETECTED Final   Cyclospora cayetanensis NOT DETECTED NOT DETECTED Final   Entamoeba histolytica NOT DETECTED NOT DETECTED Final   Giardia lamblia NOT DETECTED NOT DETECTED Final   Adenovirus F40/41 NOT DETECTED NOT DETECTED Final   Astrovirus NOT DETECTED NOT DETECTED Final   Norovirus GI/GII NOT DETECTED NOT DETECTED Final   Rotavirus A NOT DETECTED NOT DETECTED  Final   Sapovirus (I, II, IV, and V) NOT DETECTED NOT DETECTED Final  C difficile quick scan w PCR reflex     Status: None   Collection Time: 05/28/16  2:40 AM  Result Value Ref Range Status   C Diff antigen NEGATIVE NEGATIVE Final   C Diff toxin NEGATIVE NEGATIVE Final   C Diff interpretation No C. difficile detected.  Final  Culture, blood (routine x 2)     Status: None   Collection Time: 05/28/16  5:05 PM  Result Value Ref Range Status   Specimen Description BLOOD LEFT HAND  Final   Special Requests IN PEDIATRIC BOTTLE 2CC  Final   Culture NO GROWTH 5 DAYS  Final   Report Status 06/02/2016 FINAL  Final  Culture, blood (routine x 2)     Status: None   Collection Time: 05/28/16  5:26 PM  Result Value Ref Range Status   Specimen Description BLOOD RIGHT HAND  Final   Special  Requests IN PEDIATRIC BOTTLE 0.5CC  Final   Culture NO GROWTH 5 DAYS  Final   Report Status 06/02/2016 FINAL  Final     Labs: BNP (last 3 results)  Recent Labs  12/13/15 1226 04/18/16 1855 04/29/16 0305  BNP 256.6* 375.8* [id number]   Basic Metabolic Panel:  Recent Labs Lab 05/28/16 0953  05/30/16 1441 05/31/16 0437 06/01/16 0338 06/02/16 0355 06/03/16 0435  NA 138  < > 139 140 139 140 141  K 3.8  < > 3.9 4.0 3.6 3.4* 3.6  CL 112*  < > 111 115* 112* 109 107  CO2 22  < > 28 24 25 27 29   GLUCOSE 122*  < > 99 98 93 93 84  BUN <5*  < > <5* <5* <5* <5* <5*  CREATININE 0.85  < > 0.79 0.82 0.78 0.71 0.63  CALCIUM 7.1*  < > 6.6* 7.5* 7.4* 7.7* 8.1*  MG 1.9  < > 1.7 1.7 1.5* 1.6* 1.6*  PHOS 2.8  --   --   --   --   --   --   < > = values in this interval not displayed. Liver Function Tests:  Recent Labs Lab 05/29/16 0851 05/30/16 0255 05/31/16 0437 06/01/16 0338 06/02/16 0355  AST 116* 118* 102* 92* 93*  ALT 34 33 30 27 29   ALKPHOS 103 100 83 92 98  BILITOT 0.5 1.4* 1.1 0.9 0.9  PROT 5.2* 5.3* 4.8* 4.8* 5.5*  ALBUMIN 1.6* 1.6* 1.6* 1.5* 1.5*    Recent Labs Lab 05/27/16 1348 05/28/16 0953 05/29/16 0851  LIPASE 16 19 18     Recent Labs Lab 05/29/16 0643 06/01/16 0338  AMMONIA 102* 60*   CBC:  Recent Labs Lab 05/28/16 0953 05/29/16 0851 05/29/16 1614 05/30/16 0255 05/31/16 0437 06/01/16 0338 06/02/16 0355  WBC 3.6* 2.8* 3.6* 4.5 3.1* 2.8* 3.1*  NEUTROABS 2.1 1.5*  --  3.1 1.8 1.3*  --   HGB 13.0 12.5* 12.7* 12.2* 11.4* 11.4* 11.8*  HCT 38.1* 37.5* 39.1 35.3* 35.2* 34.7* 36.0*  MCV 104.1* 106.8* 107.7* 107.0* 108.0* 106.8* 106.8*  PLT 83* 9* 84* 137* 83* 80* 90*   Cardiac Enzymes:  Recent Labs Lab 05/27/16 2257 05/28/16 0953  TROPONINI 0.03* 0.03*   BNP: Invalid input(s): POCBNP CBG: No results for input(s): GLUCAP in the last 168 hours. D-Dimer No results for input(s): DDIMER in the last 72 hours. Hgb A1c No results for input(s):  HGBA1C in the last 72 hours. Lipid Profile No results  for input(s): CHOL, HDL, LDLCALC, TRIG, CHOLHDL, LDLDIRECT in the last 72 hours. Thyroid function studies No results for input(s): TSH, T4TOTAL, T3FREE, THYROIDAB in the last 72 hours.  Invalid input(s): FREET3 Anemia work up No results for input(s): VITAMINB12, FOLATE, FERRITIN, TIBC, IRON, RETICCTPCT in the last 72 hours. Urinalysis    Component Value Date/Time   COLORURINE ORANGE (A) 05/27/2016 1609   APPEARANCEUR CLOUDY (A) 05/27/2016 1609   LABSPEC 1.021 05/27/2016 1609   PHURINE 6.5 05/27/2016 1609   GLUCOSEU NEGATIVE 05/27/2016 1609   HGBUR NEGATIVE 05/27/2016 1609   BILIRUBINUR MODERATE (A) 05/27/2016 1609   KETONESUR 15 (A) 05/27/2016 1609   PROTEINUR 30 (A) 05/27/2016 1609   UROBILINOGEN 2.0 (H) 12/13/2014 1256   NITRITE POSITIVE (A) 05/27/2016 1609   LEUKOCYTESUR SMALL (A) 05/27/2016 1609   Sepsis Labs Invalid input(s): PROCALCITONIN,  WBC,  LACTICIDVEN Microbiology Recent Results (from the past 240 hour(s))  MRSA PCR Screening     Status: None   Collection Time: 05/27/16  8:38 PM  Result Value Ref Range Status   MRSA by PCR NEGATIVE NEGATIVE Final    Comment:        The GeneXpert MRSA Assay (FDA approved for NASAL specimens only), is one component of a comprehensive MRSA colonization surveillance program. It is not intended to diagnose MRSA infection nor to guide or monitor treatment for MRSA infections.   Gastrointestinal Panel by PCR , Stool     Status: None   Collection Time: 05/28/16  2:40 AM  Result Value Ref Range Status   Campylobacter species NOT DETECTED NOT DETECTED Final   Plesimonas shigelloides NOT DETECTED NOT DETECTED Final   Salmonella species NOT DETECTED NOT DETECTED Final   Yersinia enterocolitica NOT DETECTED NOT DETECTED Final   Vibrio species NOT DETECTED NOT DETECTED Final   Vibrio cholerae NOT DETECTED NOT DETECTED Final   Enteroaggregative E coli (EAEC) NOT DETECTED NOT  DETECTED Final   Enteropathogenic E coli (EPEC) NOT DETECTED NOT DETECTED Final   Enterotoxigenic E coli (ETEC) NOT DETECTED NOT DETECTED Final   Shiga like toxin producing E coli (STEC) NOT DETECTED NOT DETECTED Final   E. coli O157 NOT DETECTED NOT DETECTED Final   Shigella/Enteroinvasive E coli (EIEC) NOT DETECTED NOT DETECTED Final   Cryptosporidium NOT DETECTED NOT DETECTED Final   Cyclospora cayetanensis NOT DETECTED NOT DETECTED Final   Entamoeba histolytica NOT DETECTED NOT DETECTED Final   Giardia lamblia NOT DETECTED NOT DETECTED Final   Adenovirus F40/41 NOT DETECTED NOT DETECTED Final   Astrovirus NOT DETECTED NOT DETECTED Final   Norovirus GI/GII NOT DETECTED NOT DETECTED Final   Rotavirus A NOT DETECTED NOT DETECTED Final   Sapovirus (I, II, IV, and V) NOT DETECTED NOT DETECTED Final  C difficile quick scan w PCR reflex     Status: None   Collection Time: 05/28/16  2:40 AM  Result Value Ref Range Status   C Diff antigen NEGATIVE NEGATIVE Final   C Diff toxin NEGATIVE NEGATIVE Final   C Diff interpretation No C. difficile detected.  Final  Culture, blood (routine x 2)     Status: None   Collection Time: 05/28/16  5:05 PM  Result Value Ref Range Status   Specimen Description BLOOD LEFT HAND  Final   Special Requests IN PEDIATRIC BOTTLE 2CC  Final   Culture NO GROWTH 5 DAYS  Final   Report Status 06/02/2016 FINAL  Final  Culture, blood (routine x 2)     Status: None  Collection Time: 05/28/16  5:26 PM  Result Value Ref Range Status   Specimen Description BLOOD RIGHT HAND  Final   Special Requests IN PEDIATRIC BOTTLE 0.5CC  Final   Culture NO GROWTH 5 DAYS  Final   Report Status 06/02/2016 FINAL  Final     Time coordinating discharge: Over 30 minutes  SIGNED:   Birdie Hopes, MD  [facility] 06/03/2016, 10:51 AM Pager   If 7PM-7AM, please contact night-coverage [email or web address] Password [email or web address]

## 2016-06-03 NOTE — Care Management Note (Signed)
Case Management Note Previous CM note initiated by Bethena Roys, RN 05/29/2016, 3:22 PM   Patient Details  Name: Nathan Frank MRN: [id number] Date of Birth: [birth year]  Subjective/Objective: Pt presented for weakness, tachycardia and diarrhea. Pt is from home with wife. Pt has a hx of Afib,  ETOH abuse. Initiated on CIWA Protocol.  Pt continues on IVF and IV  Flagyl and Rocephin.             Action/Plan: CM will continue to monitor for additional needs. CM did make CSW aware of ETOH abuse and that he may benefit from resources.   Expected Discharge Date:    06/03/16              Expected Discharge Plan:  [place]  In-House Referral:  Clinical Social Work  Discharge planning Services  CM Consult  Post Acute Care Choice:  Home Health, Durable Medical Equipment Choice offered to:  Patient, Spouse  DME Arranged:  3-N-1 DME Agency:  Other - Comment  HH Arranged:  PT, OT HH Agency:     Status of Service:  Completed, signed off  If discussed at [place] of Stay Meetings, dates discussed:    Additional Comments:  06/03/16- 1130- Nathan Gibbons RN, CM- pt for d/c home today- does not want SNF for rehab and wants to return home with [facility]- orders have been placed for HHPT/OT- and 3n1- spoke with pt at bedside- per pt he has RW, Cane and an elevated toilet at home already- does not want the 3n1 for home- pt states that he has had a [place] in the past- not sure what [facility] agency he has used in past- list provided for [employer] agencies for Choice- pt stated that he wanted this CM to call his wife to check with her regarding [place] services and agency of choice- call made to Nathan Frank pt's wife- spoke with her via TC regarding [place] orders- she does not remember the agency either- and request that list be sent home with pt and then she will look over and call this CM back regarding choice of agency to use- provided wife with phone # to call this CM back  to arrange [facility] and gave pt list of [place] agencies to take home. Will await return call to arrange [place] referral also made.   Nathan Patricia, RN 06/03/2016, 11:31 AM

## 2016-06-03 NOTE — Progress Notes (Signed)
Patient discharged home with his grandson who stayed downstairs. Discharge instructions were gone over with the patient and he stated that he understood. PICC was removed by the IV team. Patient tolerated it well.

## 2016-06-04 NOTE — Care Management Note (Signed)
Case Management Note Previous CM note initiated by Bethena Roys, RN 05/29/2016, 3:22 PM   Patient Details  Name: Nathan Frank MRN: [id number] Date of Birth: December 01, 1940  Subjective/Objective: Pt presented for weakness, tachycardia and diarrhea. Pt is from home with wife. Pt has a hx of Afib,  ETOH abuse. Initiated on CIWA Protocol.  Pt continues on IVF and IV  Flagyl and Rocephin.             Action/Plan: CM will continue to monitor for additional needs. CM did make CSW aware of ETOH abuse and that he may benefit from resources.   Expected Discharge Date:    06/03/16              Expected Discharge Plan:  [place]  In-House Referral:  Clinical Social Work  Discharge planning Services  CM Consult  Post Acute Care Choice:  Home Health, Durable Medical Equipment Choice offered to:  Patient, Spouse  DME Arranged:  3-N-1 DME Agency:  Other - Comment  HH Arranged:  PT, OT [place] Agency:  [place]  Status of Service:  Completed, signed off  If discussed at [place] of Stay Meetings, dates discussed:    Additional Comments:  06/04/16- 1145- Sukanya Goldblatt RN, CM- post discharge f/u for [facility] services- did not receive call from pt's wife yesterday - call made this am to pt and wife to f/u on which [facility] agency pt wanted to use for HHPT/OT- per conversation with pt and his wife- they would like to use [facility] for services- list had been sent home with pt and reviewed again today over phone- referral called to Santiago Glad with [facility] for [facility] services- on 8/31  06/03/16- 15- Marvetta Gibbons RN, CM- pt for d/c home today- does not want SNF for rehab and wants to return home with [facility]- orders have been placed for HHPT/OT- and 3n1- spoke with pt at bedside- per pt he has RW, Cane and an elevated toilet at home already- does not want the 3n1 for home- pt states that he has had a [place] in the past- not sure what [facility] agency he has used in past- list provided for  [employer] agencies for Choice- pt stated that he wanted this CM to call his wife to check with her regarding [facility] services and agency of choice- call made to Demariae Panagopoulos pt's wife- spoke with her via TC regarding [place] orders- she does not remember the agency either- and request that list be sent home with pt and then she will look over and call this CM back regarding choice of agency to use- provided wife with phone # to call this CM back to arrange [facility] and gave pt list of [place] agencies to take home. Will await return call to arrange [place] referral also made.   Dawayne Patricia, RN 06/04/2016, 11:50 AM

## 2016-06-05 DIAGNOSIS — Z8673 Personal history of transient ischemic attack (TIA), and cerebral infarction without residual deficits: Secondary | ICD-10-CM

## 2016-06-05 DIAGNOSIS — I639 Cerebral infarction, unspecified: Secondary | ICD-10-CM

## 2016-06-05 HISTORY — DX: Cerebral infarction, unspecified: I63.9

## 2016-06-09 ENCOUNTER — Encounter (HOSPITAL_COMMUNITY): Payer: Self-pay | Admitting: Emergency Medicine

## 2016-06-09 ENCOUNTER — Emergency Department (HOSPITAL_COMMUNITY): Payer: Medicare Other

## 2016-06-09 ENCOUNTER — Inpatient Hospital Stay (HOSPITAL_COMMUNITY)
Admission: EM | Admit: 2016-06-09 | Discharge: 2016-06-16 | DRG: 308 | Disposition: A | Discharge status: Home Health | Payer: Medicare Other | Attending: Internal Medicine | Admitting: Internal Medicine

## 2016-06-09 DIAGNOSIS — I482 Chronic atrial fibrillation, unspecified: Secondary | ICD-10-CM | POA: Diagnosis present

## 2016-06-09 DIAGNOSIS — I110 Hypertensive heart disease with heart failure: Secondary | ICD-10-CM | POA: Diagnosis not present

## 2016-06-09 DIAGNOSIS — R5381 Other malaise: Secondary | ICD-10-CM | POA: Diagnosis not present

## 2016-06-09 DIAGNOSIS — B1920 Unspecified viral hepatitis C without hepatic coma: Secondary | ICD-10-CM | POA: Diagnosis present

## 2016-06-09 DIAGNOSIS — E039 Hypothyroidism, unspecified: Secondary | ICD-10-CM | POA: Diagnosis not present

## 2016-06-09 DIAGNOSIS — E876 Hypokalemia: Secondary | ICD-10-CM | POA: Diagnosis present

## 2016-06-09 DIAGNOSIS — I4891 Unspecified atrial fibrillation: Secondary | ICD-10-CM | POA: Diagnosis present

## 2016-06-09 DIAGNOSIS — Z9114 Patient's other noncompliance with medication regimen: Secondary | ICD-10-CM

## 2016-06-09 DIAGNOSIS — I959 Hypotension, unspecified: Secondary | ICD-10-CM | POA: Diagnosis not present

## 2016-06-09 DIAGNOSIS — Z8505 Personal history of malignant neoplasm of liver: Secondary | ICD-10-CM

## 2016-06-09 DIAGNOSIS — I480 Paroxysmal atrial fibrillation: Secondary | ICD-10-CM | POA: Diagnosis not present

## 2016-06-09 DIAGNOSIS — Z79899 Other long term (current) drug therapy: Secondary | ICD-10-CM

## 2016-06-09 DIAGNOSIS — N5089 Other specified disorders of the male genital organs: Secondary | ICD-10-CM | POA: Diagnosis present

## 2016-06-09 DIAGNOSIS — Z9119 Patient's noncompliance with other medical treatment and regimen: Secondary | ICD-10-CM

## 2016-06-09 DIAGNOSIS — Z8711 Personal history of peptic ulcer disease: Secondary | ICD-10-CM | POA: Diagnosis not present

## 2016-06-09 DIAGNOSIS — R109 Unspecified abdominal pain: Secondary | ICD-10-CM

## 2016-06-09 DIAGNOSIS — K219 Gastro-esophageal reflux disease without esophagitis: Secondary | ICD-10-CM | POA: Diagnosis present

## 2016-06-09 DIAGNOSIS — F1020 Alcohol dependence, uncomplicated: Secondary | ICD-10-CM | POA: Diagnosis present

## 2016-06-09 DIAGNOSIS — I252 Old myocardial infarction: Secondary | ICD-10-CM

## 2016-06-09 DIAGNOSIS — I4510 Unspecified right bundle-branch block: Secondary | ICD-10-CM | POA: Diagnosis not present

## 2016-06-09 DIAGNOSIS — Z87891 Personal history of nicotine dependence: Secondary | ICD-10-CM

## 2016-06-09 DIAGNOSIS — R0789 Other chest pain: Secondary | ICD-10-CM | POA: Diagnosis not present

## 2016-06-09 DIAGNOSIS — K7031 Alcoholic cirrhosis of liver with ascites: Secondary | ICD-10-CM | POA: Diagnosis present

## 2016-06-09 DIAGNOSIS — I5033 Acute on chronic diastolic (congestive) heart failure: Secondary | ICD-10-CM | POA: Diagnosis present

## 2016-06-09 DIAGNOSIS — R079 Chest pain, unspecified: Secondary | ICD-10-CM | POA: Diagnosis present

## 2016-06-09 DIAGNOSIS — I420 Dilated cardiomyopathy: Secondary | ICD-10-CM | POA: Diagnosis present

## 2016-06-09 DIAGNOSIS — I509 Heart failure, unspecified: Secondary | ICD-10-CM | POA: Diagnosis not present

## 2016-06-09 DIAGNOSIS — R197 Diarrhea, unspecified: Secondary | ICD-10-CM | POA: Diagnosis present

## 2016-06-09 DIAGNOSIS — Z86718 Personal history of other venous thrombosis and embolism: Secondary | ICD-10-CM

## 2016-06-09 DIAGNOSIS — D696 Thrombocytopenia, unspecified: Secondary | ICD-10-CM | POA: Diagnosis present

## 2016-06-09 DIAGNOSIS — K8020 Calculus of gallbladder without cholecystitis without obstruction: Secondary | ICD-10-CM | POA: Diagnosis not present

## 2016-06-09 DIAGNOSIS — I10 Essential (primary) hypertension: Secondary | ICD-10-CM | POA: Diagnosis present

## 2016-06-09 DIAGNOSIS — M7989 Other specified soft tissue disorders: Secondary | ICD-10-CM | POA: Diagnosis not present

## 2016-06-09 DIAGNOSIS — R0602 Shortness of breath: Secondary | ICD-10-CM | POA: Diagnosis not present

## 2016-06-09 DIAGNOSIS — K7030 Alcoholic cirrhosis of liver without ascites: Secondary | ICD-10-CM | POA: Diagnosis present

## 2016-06-09 LAB — CBC WITH DIFFERENTIAL/PLATELET
BASOS PCT: 1 %
Basophils Absolute: 0 10*3/uL (ref 0.0–0.1)
EOS ABS: 0 10*3/uL (ref 0.0–0.7)
Eosinophils Relative: 0 %
HCT: 36.8 % — ABNORMAL LOW (ref 39.0–52.0)
HEMOGLOBIN: 12.2 g/dL — AB (ref 13.0–17.0)
Lymphocytes Relative: 23 %
Lymphs Abs: 0.9 10*3/uL (ref 0.7–4.0)
MCH: 33.8 pg (ref 26.0–34.0)
MCHC: 33.2 g/dL (ref 30.0–36.0)
MCV: 101.9 fL — ABNORMAL HIGH (ref 78.0–100.0)
Monocytes Absolute: 0.6 10*3/uL (ref 0.1–1.0)
Monocytes Relative: 15 %
NEUTROS PCT: 61 %
Neutro Abs: 2.5 10*3/uL (ref 1.7–7.7)
Platelets: 144 10*3/uL — ABNORMAL LOW (ref 150–400)
RBC: 3.61 MIL/uL — AB (ref 4.22–5.81)
RDW: 16.5 % — ABNORMAL HIGH (ref 11.5–15.5)
WBC: 4 10*3/uL (ref 4.0–10.5)

## 2016-06-09 LAB — COMPREHENSIVE METABOLIC PANEL
ALBUMIN: 2.1 g/dL — AB (ref 3.5–5.0)
ALK PHOS: 67 U/L (ref 38–126)
ALT: 26 U/L (ref 17–63)
ANION GAP: 7 (ref 5–15)
AST: 69 U/L — ABNORMAL HIGH (ref 15–41)
BUN: <5 mg/dL — ABNORMAL LOW (ref 6–20)
CALCIUM: 7.9 mg/dL — AB (ref 8.9–10.3)
CHLORIDE: 107 mmol/L (ref 101–111)
CO2: 26 mmol/L (ref 22–32)
CREATININE: 0.87 mg/dL (ref 0.61–1.24)
GFR calc Af Amer: >60 mL/min (ref >60)
GFR calc non Af Amer: >60 mL/min (ref >60)
GLUCOSE: 93 mg/dL (ref 65–99)
Potassium: 3.3 mmol/L — ABNORMAL LOW (ref 3.5–5.1)
SODIUM: 140 mmol/L (ref 135–145)
Total Bilirubin: 1.7 mg/dL — ABNORMAL HIGH (ref 0.3–1.2)
Total Protein: 5.9 g/dL — ABNORMAL LOW (ref 6.5–8.1)

## 2016-06-09 LAB — I-STAT CHEM 8, ED
CHLORIDE: 102 mmol/L (ref 101–111)
CREATININE: 0.7 mg/dL (ref 0.61–1.24)
Calcium, Ion: 0.99 mmol/L — ABNORMAL LOW (ref 1.15–1.40)
Glucose, Bld: 88 mg/dL (ref 65–99)
HEMATOCRIT: 40 % (ref 39.0–52.0)
Hemoglobin: 13.6 g/dL (ref 13.0–17.0)
POTASSIUM: 3.3 mmol/L — AB (ref 3.5–5.1)
SODIUM: 143 mmol/L (ref 135–145)
TCO2: 27 mmol/L (ref 0–100)

## 2016-06-09 LAB — BRAIN NATRIURETIC PEPTIDE: B NATRIURETIC PEPTIDE 5: 463.2 pg/mL — AB (ref 0.0–100.0)

## 2016-06-09 LAB — ETHANOL

## 2016-06-09 LAB — RAPID URINE DRUG SCREEN, HOSP PERFORMED
AMPHETAMINES: NOT DETECTED
BENZODIAZEPINES: POSITIVE — AB
Barbiturates: NOT DETECTED
COCAINE: NOT DETECTED
OPIATES: POSITIVE — AB
Tetrahydrocannabinol: NOT DETECTED

## 2016-06-09 LAB — URINALYSIS, ROUTINE W REFLEX MICROSCOPIC
GLUCOSE, UA: NEGATIVE mg/dL
Hgb urine dipstick: NEGATIVE
Ketones, ur: 15 mg/dL — AB
LEUKOCYTES UA: NEGATIVE
NITRITE: NEGATIVE
PROTEIN: NEGATIVE mg/dL
Specific Gravity, Urine: 1.005 (ref 1.005–1.030)
pH: 7.5 (ref 5.0–8.0)

## 2016-06-09 LAB — I-STAT TROPONIN, ED: TROPONIN I, POC: 0.02 ng/mL (ref 0.00–0.08)

## 2016-06-09 LAB — D-DIMER, QUANTITATIVE: D-Dimer, Quant: 2.97 ug/mL-FEU — ABNORMAL HIGH (ref 0.00–0.50)

## 2016-06-09 LAB — POC OCCULT BLOOD, ED: Fecal Occult Bld: NEGATIVE

## 2016-06-09 LAB — LIPASE, BLOOD: Lipase: 23 U/L (ref 11–51)

## 2016-06-09 LAB — MAGNESIUM: Magnesium: 1.3 mg/dL — ABNORMAL LOW (ref 1.7–2.4)

## 2016-06-09 MED ORDER — MAGNESIUM SULFATE 2 GM/50ML IV SOLN
2.0000 g | Freq: Once | INTRAVENOUS | Status: AC
  Administered 2016-06-10: 2 g via INTRAVENOUS
  Filled 2016-06-09: qty 50

## 2016-06-09 MED ORDER — GUAIFENESIN ER 600 MG PO TB12
600.0000 mg | ORAL_TABLET | Freq: Two times a day (BID) | ORAL | Status: DC
  Administered 2016-06-10 – 2016-06-16 (×12): 600 mg via ORAL
  Filled 2016-06-09 (×13): qty 1

## 2016-06-09 MED ORDER — POTASSIUM CHLORIDE 10 MEQ/100ML IV SOLN: 10.0000 meq | INTRAVENOUS | Status: AC

## 2016-06-09 MED ORDER — ONDANSETRON HCL 4 MG/2ML IJ SOLN
4.0000 mg | Freq: Four times a day (QID) | INTRAMUSCULAR | Status: DC | PRN
Start: 2016-06-09 — End: 2016-06-16

## 2016-06-09 MED ORDER — FUROSEMIDE 10 MG/ML IJ SOLN
40.0000 mg | Freq: Two times a day (BID) | INTRAMUSCULAR | Status: DC
  Administered 2016-06-10 – 2016-06-11 (×3): 40 mg via INTRAVENOUS
  Filled 2016-06-09 (×4): qty 4

## 2016-06-09 MED ORDER — TRAZODONE HCL 100 MG PO TABS: 100.0000 mg | ORAL_TABLET | Freq: Every day | ORAL | Status: DC

## 2016-06-09 MED ORDER — DILTIAZEM HCL 100 MG IV SOLR
5.0000 mg/h | Freq: Once | INTRAVENOUS | Status: AC
  Administered 2016-06-09: 5 mg/h via INTRAVENOUS

## 2016-06-09 MED ORDER — METOPROLOL TARTRATE 12.5 MG HALF TABLET
12.5000 mg | ORAL_TABLET | Freq: Two times a day (BID) | ORAL | Status: DC
  Administered 2016-06-10 – 2016-06-14 (×9): 12.5 mg via ORAL
  Filled 2016-06-09 (×9): qty 1

## 2016-06-09 MED ORDER — SUCRALFATE 1 G PO TABS
1.0000 g | ORAL_TABLET | Freq: Three times a day (TID) | ORAL | Status: DC
  Administered 2016-06-10 – 2016-06-16 (×25): 1 g via ORAL
  Filled 2016-06-09 (×26): qty 1

## 2016-06-09 MED ORDER — HYDROCODONE-ACETAMINOPHEN 5-325 MG PO TABS
1.0000 | ORAL_TABLET | ORAL | Status: DC | PRN
  Administered 2016-06-11: 2 via ORAL
  Administered 2016-06-11: 1 via ORAL
  Filled 2016-06-09: qty 2
  Filled 2016-06-09: qty 1

## 2016-06-09 MED ORDER — ACETAMINOPHEN 650 MG RE SUPP: 650.0000 mg | Freq: Four times a day (QID) | RECTAL | Status: DC | PRN

## 2016-06-09 MED ORDER — MAGNESIUM OXIDE 400 (241.3 MG) MG PO TABS
400.0000 mg | ORAL_TABLET | Freq: Every day | ORAL | Status: DC
  Administered 2016-06-10: 400 mg via ORAL
  Filled 2016-06-09: qty 1

## 2016-06-09 MED ORDER — LORAZEPAM 2 MG/ML IJ SOLN
1.0000 mg | Freq: Once | INTRAMUSCULAR | Status: AC
  Administered 2016-06-09: 1 mg via INTRAVENOUS
  Filled 2016-06-09: qty 1

## 2016-06-09 MED ORDER — ACETAMINOPHEN 325 MG PO TABS
650.0000 mg | ORAL_TABLET | Freq: Four times a day (QID) | ORAL | Status: DC | PRN
Start: 2016-06-09 — End: 2016-06-16

## 2016-06-09 MED ORDER — SACCHAROMYCES BOULARDII 250 MG PO CAPS
250.0000 mg | ORAL_CAPSULE | Freq: Two times a day (BID) | ORAL | Status: DC
  Administered 2016-06-10 – 2016-06-16 (×13): 250 mg via ORAL
  Filled 2016-06-09 (×16): qty 1

## 2016-06-09 MED ORDER — MAGNESIUM OXIDE 400 (241.3 MG) MG PO TABS
800.0000 mg | ORAL_TABLET | Freq: Once | ORAL | Status: DC
  Filled 2016-06-09: qty 2

## 2016-06-09 MED ORDER — LEVOTHYROXINE SODIUM 50 MCG PO TABS
50.0000 ug | ORAL_TABLET | Freq: Every day | ORAL | Status: DC
  Administered 2016-06-10 – 2016-06-16 (×8): 50 ug via ORAL
  Filled 2016-06-09 (×8): qty 1

## 2016-06-09 MED ORDER — LEVALBUTEROL HCL 0.63 MG/3ML IN NEBU: 0.6300 mg | INHALATION_SOLUTION | Freq: Four times a day (QID) | RESPIRATORY_TRACT | Status: DC | PRN

## 2016-06-09 MED ORDER — FOLIC ACID 1 MG PO TABS
1.0000 mg | ORAL_TABLET | Freq: Every day | ORAL | Status: DC
  Administered 2016-06-10 – 2016-06-16 (×7): 1 mg via ORAL
  Filled 2016-06-09 (×7): qty 1

## 2016-06-09 MED ORDER — SODIUM CHLORIDE 0.9% FLUSH
3.0000 mL | Freq: Two times a day (BID) | INTRAVENOUS | Status: DC
  Administered 2016-06-10 – 2016-06-16 (×12): 3 mL via INTRAVENOUS

## 2016-06-09 MED ORDER — ONDANSETRON HCL 4 MG PO TABS: 4.0000 mg | ORAL_TABLET | Freq: Four times a day (QID) | ORAL | Status: DC | PRN

## 2016-06-09 MED ORDER — MORPHINE SULFATE (PF) 4 MG/ML IV SOLN
4.0000 mg | Freq: Once | INTRAVENOUS | Status: AC
  Administered 2016-06-09: 4 mg via INTRAVENOUS
  Filled 2016-06-09: qty 1

## 2016-06-09 MED ORDER — FUROSEMIDE 10 MG/ML IJ SOLN
INTRAMUSCULAR | Status: AC
  Filled 2016-06-09: qty 4

## 2016-06-09 MED ORDER — DILTIAZEM HCL-DEXTROSE 100-5 MG/100ML-% IV SOLN (PREMIX): 5.0000 mg/h | INTRAVENOUS | Status: DC

## 2016-06-09 MED ORDER — MAGNESIUM SULFATE 50 % IJ SOLN: 2.0000 g | Freq: Once | INTRAMUSCULAR | Status: DC

## 2016-06-09 MED ORDER — IOPAMIDOL (ISOVUE-370) INJECTION 76%
INTRAVENOUS | Status: AC
  Administered 2016-06-09: 80 mL
  Filled 2016-06-09: qty 100

## 2016-06-09 MED ORDER — CHLORDIAZEPOXIDE HCL 5 MG PO CAPS
10.0000 mg | ORAL_CAPSULE | Freq: Once | ORAL | Status: AC
  Administered 2016-06-13: 10 mg via ORAL
  Filled 2016-06-09: qty 2

## 2016-06-09 MED ORDER — TRAMADOL HCL 50 MG PO TABS: 50.0000 mg | ORAL_TABLET | Freq: Three times a day (TID) | ORAL | Status: DC

## 2016-06-09 MED ORDER — ZOLPIDEM TARTRATE 5 MG PO TABS
5.0000 mg | ORAL_TABLET | Freq: Once | ORAL | Status: AC
  Administered 2016-06-10: 5 mg via ORAL
  Filled 2016-06-09: qty 1

## 2016-06-09 MED ORDER — VITAMIN B-1 100 MG PO TABS
100.0000 mg | ORAL_TABLET | Freq: Every day | ORAL | Status: DC
  Administered 2016-06-10 – 2016-06-16 (×7): 100 mg via ORAL
  Filled 2016-06-09 (×7): qty 1

## 2016-06-09 MED ORDER — ENOXAPARIN SODIUM 40 MG/0.4ML ~~LOC~~ SOLN
40.0000 mg | Freq: Every day | SUBCUTANEOUS | Status: DC
  Administered 2016-06-10 – 2016-06-16 (×7): 40 mg via SUBCUTANEOUS
  Filled 2016-06-09 (×7): qty 0.4

## 2016-06-09 MED ORDER — SPIRONOLACTONE 25 MG PO TABS
50.0000 mg | ORAL_TABLET | Freq: Every day | ORAL | Status: DC
  Administered 2016-06-10 – 2016-06-14 (×5): 50 mg via ORAL
  Filled 2016-06-09 (×6): qty 2

## 2016-06-09 MED ORDER — FUROSEMIDE 10 MG/ML IJ SOLN
40.0000 mg | Freq: Once | INTRAMUSCULAR | Status: AC
  Administered 2016-06-09: 40 mg via INTRAVENOUS
  Filled 2016-06-09: qty 4

## 2016-06-09 MED ORDER — SODIUM CHLORIDE 0.9 % IV BOLUS (SEPSIS)
1000.0000 mL | Freq: Once | INTRAVENOUS | Status: AC
  Administered 2016-06-09: 1000 mL via INTRAVENOUS

## 2016-06-09 MED ORDER — POTASSIUM CHLORIDE 10 MEQ/100ML IV SOLN
10.0000 meq | INTRAVENOUS | Status: AC
  Administered 2016-06-10 (×4): 10 meq via INTRAVENOUS
  Filled 2016-06-09 (×4): qty 100

## 2016-06-09 MED ORDER — ASPIRIN 81 MG PO CHEW
324.0000 mg | CHEWABLE_TABLET | Freq: Once | ORAL | Status: DC
  Filled 2016-06-09: qty 4

## 2016-06-09 NOTE — ED Triage Notes (Signed)
Per EMS- pt here from home with multiple complaints. Pt reports diarrhea for 2 weeks. Pt also having chest pain radiating to left arm and side, running in afib RVR 120s-170s. Pain 7/10. CBG 109. Afebrile. Pt refused IV access and refused Asprin. Pt states he only gets PICC lines.

## 2016-06-09 NOTE — ED Notes (Signed)
Pt refusing stick for IV/labs and requesting PICC line.

## 2016-06-09 NOTE — ED Provider Notes (Signed)
[facility] DEPT Provider Note   CSN: [id number] Arrival date & time: 06/09/16  1308     History   Chief Complaint Chief Complaint  Patient presents with  . Chest Pain  . Diarrhea    HPI HARLON WILLETTS is a 75 y.o. male.  HPI  JAKYLEN DION is a 75 y.o. male, with a history of Alcohol abuse, hepatic cirrhosis, anemia hepatitis C, LV dysfunction, and GI bleed, presenting to the ED with chest pain that began last night. Pain is 7/10, constant, sharp, left chest, radiates into the left arm.   Pt also complains of dark "almost black" colored diarrhea for the past 2-3 weeks. Endorses at least 7 BMs in last 24 hours. Also endorses intermittent abdominal cramping, relieved with BM.  Pt states he was drinking a pint of liquor a day prior to his admission to the hospital last week. States he hasn't had anything alcohol to drink since he was discharged.   Denies nausea/vomiting, fever/chills, shortness of breath, abdominal pain, or any other complaints.   Past Medical History:  Diagnosis Date  . Abnormal TSH   . Acute gastric ulcer   . Acute gastritis with hemorrhage   . Alcohol abuse   . Alcohol dependence ([place]) 02/02/2014  . Anemia   . Cirrhosis ([place]) 06/04/2012  . Depressive disorder 02/01/2014  . Dizziness and giddiness 12/13/2014  . DVT of lower limb, acute ([place]) 06/06/2012  . Essential hypertension   . Fatty liver   . Gallstones   . Gastritis 06/07/2012  . GERD (gastroesophageal reflux disease)   . GI bleed due to NSAIDs 12/13/2014  . Granulomatous gastritis   . Hematuria 06/04/2012  . Hepatitis C   . Hepatocellular carcinoma ([place])   . Heroin abuse    "I haven't done that since I don't know when."  . Heroin overdose 02/20/2014  . LV dysfunction    a. 04/2015: EF 45-50% by cath.  . Neuropathy ([place])   . NSTEMI (non-ST elevated myocardial infarction) ([place])    a. 04/2015 - patent coronaries. Etiology possibly due to coronary spasm versus embolus, stress cardiomyopathy  (atypical), and aborted infarction related to plaque rupture with thrombosis and dissolution. Amlodipine started. Not on antiplatelets due to GIB/cirrhosis history.  . Oral thrush 06/05/2012  . Polysubstance abuse    Rare marijuana.  No EtOH x 2 months.   . Prolonged Q-T interval on ECG    a. 12/2014 - treated with magnesium.  . Right knee pain 12/13/2014  . S/P alcohol detoxification 02/02/2014  . SVT (supraventricular tachycardia) ([place])    a. 12/2014 in setting of GIB, ETOH, NSAIDS, gastritis.  . Symptomatic cholelithiasis 12/15/2013  . Thrombocytopenia ([place])   . Upper GI bleeding 12/13/2014  . Weight loss 06/04/2012    Patient Active Problem List   Diagnosis Date Noted  . Hypomagnesemia 06/09/2016  . Alcoholic cirrhosis of liver with ascites ([place])   . Other specified hypothyroidism   . Hepatocellular carcinoma ([place])   . Paroxysmal atrial fibrillation (HCC)   . Alcohol withdrawal ([place]) 05/27/2016  . Generalized weakness 05/27/2016  . Hypokalemia 05/27/2016  . Dehydration 05/27/2016  . Atrial fibrillation with rapid ventricular response ([place]) 04/28/2016  . Diarrhea 04/28/2016  . SIRS (systemic inflammatory response syndrome) ([place]) 04/19/2016  . Alcohol use disorder, moderate, in controlled environment, dependence ([place])   . Lactic acidosis   . Tachycardia   . Atrial fibrillation with RVR ([place]) 04/18/2016  . Chest pain 04/18/2016  . Hypotension 04/18/2016  .  Elevated lactic acid level 04/18/2016  . Atrial flutter with rapid ventricular response ([place]) 04/18/2016  . Chronic atrial fibrillation ([place]) 04/18/2016  . SVT (supraventricular tachycardia) ([place])   . Prolonged Q-T interval on ECG   . Neuropathy ([place])   . Essential hypertension   . Thrombocytopenia ([place]) 04/16/2015  . Abnormal TSH 04/16/2015  . NSTEMI (non-ST elevated myocardial infarction) ([place])   . Acute gastric ulcer   . Acute gastritis with hemorrhage   . GI bleed due to NSAIDs 12/13/2014  . Right knee pain 12/13/2014    . Dizziness and giddiness 12/13/2014  . Upper GI bleeding 12/13/2014  . Heroin overdose 02/20/2014  . S/P alcohol detoxification 02/02/2014  . Alcohol dependence ([place]) 02/02/2014  . Depressive disorder 02/01/2014  . Symptomatic cholelithiasis 12/15/2013  . Abdominal pain 12/14/2013  . Gastritis 06/07/2012  . DVT of lower limb, acute ([place]) 06/06/2012  . Anemia 06/06/2012  . Oral thrush 06/05/2012  . Alcohol abuse 06/04/2012  . Weight loss 06/04/2012  . 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 Cirrhosis ([place]) 06/04/2012  . Hematuria 06/04/2012    Past Surgical History:  Procedure Laterality Date  . CARDIAC CATHETERIZATION N/A 04/15/2015   Procedure: Left Heart Cath and Coronary Angiography;  Surgeon: Belva Crome, MD;  Location: [facility];  Service: Cardiovascular;  Laterality: N/A;  . CIRCUMCISION    . ESOPHAGOGASTRODUODENOSCOPY  06/07/2012   Procedure: ESOPHAGOGASTRODUODENOSCOPY (EGD);  Surgeon: Milus Banister, MD;  Location: [facility];  Service: Endoscopy;  Laterality: N/A;  may need treatment of varices  . ESOPHAGOGASTRODUODENOSCOPY N/A 12/14/2014   Procedure: ESOPHAGOGASTRODUODENOSCOPY (EGD);  Surgeon: Jerene Bears, MD;  Location: [facility];  Service: Endoscopy;  Laterality: N/A;  . TONSILLECTOMY         Home Medications    Prior to Admission medications   Medication Sig Start Date End Date Taking? Authorizing Provider  amiodarone (PACERONE) 200 MG tablet Take 1 tablet (200 mg total) by mouth 2 (two) times daily. 04/22/16  Yes Kelvin Cellar, MD  Cholecalciferol (VITAMIN D PO) Take 1 tablet by mouth daily.   Yes Historical Provider, MD  furosemide (LASIX) 40 MG tablet Take 0.5 tablets (20 mg total) by mouth daily. Patient taking differently: Take 40 mg by mouth daily.  05/01/16  Yes Eugenie Filler, MD  levothyroxine (SYNTHROID, LEVOTHROID) 50 MCG tablet Take 1 tablet (50 mcg total) by mouth daily before breakfast. 06/03/16  Yes  Verlee Monte, MD  metoprolol tartrate (LOPRESSOR) 25 MG tablet Take 0.5 tablets (12.5 mg total) by mouth 2 (two) times daily. Patient taking differently: Take 12.5 mg by mouth daily.  05/01/16  Yes Eugenie Filler, MD  Multiple Vitamin (MULTIVITAMIN WITH MINERALS) TABS tablet Take 1 tablet by mouth daily. 04/21/16  Yes Maryann Mikhail, DO  omeprazole (PRILOSEC) 40 MG capsule Take 1 capsule (40 mg total) by mouth daily. For acid reflux 12/15/14  Yes Thurnell Lose, MD  saccharomyces boulardii (FLORASTOR) 250 MG capsule Take 1 capsule (250 mg total) by mouth 2 (two) times daily. 06/03/16  Yes Verlee Monte, MD  traMADol (ULTRAM) 50 MG tablet Take 50 mg by mouth 3 (three) times daily. 03/04/16  Yes Historical Provider, MD  traZODone (DESYREL) 100 MG tablet Take 100 mg by mouth at bedtime. 02/20/16  Yes Historical Provider, MD  folic acid (FOLVITE) 1 MG tablet Take 1 tablet (1 mg total) by mouth daily. Patient not taking: Reported on 05/27/2016 04/21/16   Cristal Ford, DO  magnesium oxide (MAG-OX) 400 (241.3 Mg) MG tablet Take 1 tablet (400 mg total) by mouth daily. 06/03/16   Verlee Monte, MD  simethicone (MYLICON) 80 MG chewable tablet Chew 2 tablets (160 mg total) by mouth 4 (four) times daily as needed for flatulence. Patient not taking: Reported on 05/27/2016 05/01/16   Eugenie Filler, MD  spironolactone (ALDACTONE) 50 MG tablet Take 1 tablet (50 mg total) by mouth daily. 06/03/16   Verlee Monte, MD  sucralfate (CARAFATE) 1 g tablet Take 1 tablet (1 g total) by mouth 4 (four) times daily -  with meals and at bedtime. Patient not taking: Reported on 06/09/2016 04/22/16   Kelvin Cellar, MD  thiamine 100 MG tablet Take 1 tablet (100 mg total) by mouth daily. Patient not taking: Reported on 05/27/2016 04/21/16   Cristal Ford, DO    Family History Family History  Problem Relation Age of Onset  . Stroke Father   . Prostate cancer Brother   . Heart disease Mother     Pacemaker    Social  History Social History  Substance Use Topics  . Smoking status: Former Smoker    Packs/day: 0.50    Years: 5.00    Types: Cigarettes  . Smokeless tobacco: Never Used     Comment: "quit smoking cigarettes in the 1970's"  . Alcohol use 16.8 oz/week    28 Glasses of wine per week     Comment: drinks 1 pint of wine/day      Allergies   Review of patient's allergies indicates no known allergies.   Review of Systems Review of Systems  Constitutional: Positive for chills. Negative for diaphoresis, fever and unexpected weight change.  Respiratory: Negative for cough, chest tightness and shortness of breath.   Cardiovascular: Positive for chest pain. Negative for palpitations and leg swelling.  Gastrointestinal: Positive for abdominal pain (intermittent cramping) and diarrhea. Negative for constipation, nausea and vomiting.  Genitourinary: Negative for dysuria and flank pain.  Musculoskeletal: Negative for back pain.  Skin: Negative for color change and pallor.  Neurological: Negative for dizziness, syncope, weakness and light-headedness.  All other systems reviewed and are negative.    Physical Exam Updated Vital Signs BP 110/88   Pulse (!) 130   Temp 98.6 F (37 C) (Oral)   Resp 17   Ht 5\' 11"  (1.803 m)   SpO2 96%   Physical Exam  Constitutional: He appears well-developed and well-nourished. No distress.  HENT:  Head: Normocephalic and atraumatic.  Eyes: Conjunctivae are normal. Pupils are equal, round, and reactive to light.  Neck: Normal range of motion. Neck supple.  Cardiovascular: Regular rhythm, normal heart sounds and intact distal pulses.  Tachycardia present.   Pulmonary/Chest: Effort normal and breath sounds normal. No respiratory distress. He exhibits tenderness.  Abdominal: Soft. There is no tenderness. There is no guarding.  Genitourinary:  Genitourinary Comments: No external hemorrhoids, fissures, or lesions noted. No gross blood or stool burden. No rectal  tenderness. Med Tech served as chaperone during the rectal exam.  Musculoskeletal: He exhibits edema and tenderness.  Bilateral pitting LE edema. Tenderness to the left arm and shoulder. Pain increases with range of motion. No discernible swelling, erythema, or deformity.  Lymphadenopathy:    He has no cervical adenopathy.  Neurological: He is alert.  Skin: Skin is warm and dry. He is not diaphoretic.  Psychiatric: He has a normal mood and affect. His behavior is normal.  Nursing note and vitals reviewed.    ED Treatments /  Results  Labs (all labs ordered are listed, but only abnormal results are displayed) Labs Reviewed  CBC WITH DIFFERENTIAL/PLATELET - Abnormal; Notable for the following:       Result Value   RBC 3.61 (*)    Hemoglobin 12.2 (*)    HCT 36.8 (*)    MCV 101.9 (*)    RDW 16.5 (*)    Platelets 144 (*)    All other components within normal limits  COMPREHENSIVE METABOLIC PANEL - Abnormal; Notable for the following:    Potassium 3.3 (*)    BUN <5 (*)    Calcium 7.9 (*)    Total Protein 5.9 (*)    Albumin 2.1 (*)    AST 69 (*)    Total Bilirubin 1.7 (*)    All other components within normal limits  MAGNESIUM - Abnormal; Notable for the following:    Magnesium 1.3 (*)    All other components within normal limits  BRAIN NATRIURETIC PEPTIDE - Abnormal; Notable for the following:    B Natriuretic Peptide 463.2 (*)    All other components within normal limits  D-DIMER, QUANTITATIVE (NOT AT [facility]) - Abnormal; Notable for the following:    D-Dimer, Quant 2.97 (*)    All other components within normal limits  I-STAT CHEM 8, ED - Abnormal; Notable for the following:    Potassium 3.3 (*)    BUN <3 (*)    Calcium, Ion 0.99 (*)    All other components within normal limits  URINE CULTURE  LIPASE, BLOOD  ETHANOL  URINALYSIS, ROUTINE W REFLEX MICROSCOPIC (NOT AT [facility])  URINE RAPID DRUG SCREEN, HOSP PERFORMED  TROPONIN I  I-STAT TROPOININ, ED  POC OCCULT BLOOD, ED     EKG  EKG Interpretation  Date/Time:  Tuesday June 09 2016 13:17:29 EDT Ventricular Rate:  120 PR Interval:    QRS Duration: 128 QT Interval:  326 QTC Calculation: 461 R Axis:   -157 Text Interpretation:  Atrial fibrillation Right bundle branch block Nonspecific T abnormalities, lateral leads Since last tracing rate faster Confirmed by FLOYD MD, DANIEL [id number]) on 06/09/2016 1:58:41 PM       Radiology Dg Chest 2 View  Result Date: 06/09/2016 CLINICAL DATA:  Chest pain, diarrhea and shortness of breath. History of hypertension, hepatitis and cirrhosis. EXAM: CHEST  2 VIEW COMPARISON:  05/30/2016 FINDINGS: Cardiac silhouette is mildly enlarged. No mediastinal or hilar masses or evidence of adenopathy. There is lung base opacity consistent with combination of small effusions and atelectasis. Infection is possible but felt less likely. Remainder of the lungs is clear. No pneumothorax. Bony thorax is demineralized but intact. IMPRESSION: 1. Mild lung base opacity consistent with small effusions and atelectasis. Consider pneumonia if there are consistent clinical symptoms. 2. Mild cardiomegaly. No evidence of pulmonary edema. No pneumothorax. Electronically Signed   By: Lajean Manes M.D.   On: 06/09/2016 16:41   Ct Angio Chest Pe W Or Wo Contrast  Result Date: 06/09/2016 CLINICAL DATA:  Chest pain radiating to LEFT arm. Shortness of breath. History of atrial fibrillation, hypertension, alcohol abuse, hepatitis-C and hepatic cellular carcinoma. EXAM: CT ANGIOGRAPHY CHEST WITH CONTRAST TECHNIQUE: Multidetector CT imaging of the chest was performed using the standard protocol during bolus administration of intravenous contrast. Multiplanar CT image reconstructions and MIPs were obtained to evaluate the vascular anatomy. CONTRAST:  80 cc Isovue 370 COMPARISON:  Chest radiograph June 09, 2016 at 1351 hours and CT abdomen and pelvis May 28, 2016 FINDINGS: PULMONARY ARTERY: Adequate  contrast  opacification of the pulmonary artery's. Main pulmonary artery is enlarged, 3.7 cm in transaxial dimension. No pulmonary arterial filling defects to the level of the subsegmental branches. MEDIASTINUM: The heart is moderately enlarged. No RIGHT heart strain. No pericardial effusions. Minimal coronary artery calcifications. Sub cm cardio phrenic angle lymph nodes. No mediastinal lymphadenopathy by CT size criteria. LUNGS: Small to moderate lobulated bilateral pleural effusions. Bronchial wall thickening, predominately in the lung bases. 4 mm ground-glass nodule RIGHT lower lobe, below size followup recommendations. Compressive atelectasis. Old RIGHT posterior rib fractures. SOFT TISSUES AND OSSEOUS STRUCTURES: Multiple tiny gallstones partially imaged in the abdomen. Small amount of ascites. Nodular liver compatible with history of cirrhosis ; not tailored for evaluation of patient's known hepatic cellular carcinoma. Mild gynecomastia. Sub cm sclerotic focus LEFT posterior T6 vertebral body favoring bone island. Review of the MIP images confirms the above findings. IMPRESSION: No acute pulmonary embolism. Moderate cardiomegaly and small to moderate pleural effusions new from May 28, 2016. Bibasilar bronchial wall thickening favoring pulmonary edema. Enlarged main pulmonary artery associated with pulmonary arterial hypertension. Cirrhosis and small amount of ascites. Electronically Signed   By: Elon Alas M.D.   On: 06/09/2016 16:45    Procedures Procedures (including critical care time)  Medications Ordered in ED Medications  aspirin chewable tablet 324 mg (324 mg Oral Refused 06/09/16 1530)  chlordiazePOXIDE (LIBRIUM) capsule 10 mg (not administered)  magnesium oxide (MAG-OX) tablet 800 mg (not administered)  furosemide (LASIX) injection 40 mg (not administered)  magnesium sulfate (IV Push/IM) injection 2 g (not administered)  potassium chloride 10 mEq in 100 mL IVPB (not administered)    diltiazem (CARDIZEM) 100 mg in dextrose 5 % 100 mL (1 mg/mL) infusion (not administered)  sodium chloride 0.9 % bolus 1,000 mL (1,000 mLs Intravenous New Bag/Given 06/09/16 1530)  morphine 4 MG/ML injection 4 mg (4 mg Intravenous Given 06/09/16 1529)  LORazepam (ATIVAN) injection 1 mg (1 mg Intravenous Given 06/09/16 1529)  iopamidol (ISOVUE-370) 76 % injection (80 mLs  Contrast Given 06/09/16 1620)     Initial Impression / Assessment and Plan / ED Course  I have reviewed the triage vital signs and the nursing notes.  Pertinent labs & imaging results that were available during my care of the patient were reviewed by me and considered in my medical decision making (see chart for details).  Clinical Course    ARMISTEAD HUNTING presents with chest pain, diarrhea, and tachycardia. Chest pain since last night.  Findings and plan of care discussed with Deno Etienne, DO.   HEART score is 6, indicating moderate risk for a cardiac event. Wells criteria score is 4.5, indicating moderate risk for PE. Hypomagnesemia addressed. Patient's BNP seems to be consistent with previous values. Patient will need to come in at the very least for chest pain observation. Repeat check of the patient's rhythm revealed that he was no longer showing signs of atrial fibrillation. 6:43 PM Spoke with Dr. Roel Cluck, hospitalist, who agreed to admit the patient to telemetry observation. Requested a regular troponin.  Chart review reveals patient was admitted on August 23 and discharged on August 30. Patient had similar complaints as he does today, most notably abdominal cramping with diarrhea and tachycardia. Patient was admitted for dehydration. During the patient's admission: Patient's labs were negative for C. Difficile, his GI panel was negative, and blood cultures were negative. Vital signs upon discharge were within normal limits and not tachycardic.   Vitals:   06/09/16 1314 06/09/16 1345 06/09/16  1600 06/09/16 1705  BP: 110/88   113/85 116/86  Pulse: (!) 130 (!) 125 113 103  Resp: 17 15 13 16   Temp: 98.6 F (37 C)     TempSrc: Oral     SpO2: 96% 97% 97% 97%  Height:       Vitals:   06/09/16 1705 06/09/16 1800 06/09/16 1900 06/09/16 1916  BP: 116/86 113/89 114/89   Pulse: 103 86    Resp: 16   18  Temp:      TempSrc:      SpO2: 97% 96% 97%   Height:         Final Clinical Impressions(s) / ED Diagnoses   Final diagnoses:  Chest pain, unspecified chest pain type    New Prescriptions New Prescriptions   No medications on file     Lorayne Bender, PA-C 06/09/16 [place], DO 06/10/16 [place], DO 06/10/16 1004

## 2016-06-09 NOTE — ED Notes (Signed)
ED MD requests hold lasix until admitting has seen pt.

## 2016-06-09 NOTE — H&P (Signed)
Nathan Frank [id number] DOB: 05/11/1941 DOA: 06/09/2016     PCP: Thressa Sheller, MD   Outpatient Specialists: [facility]enger Patient coming from: home Lives  With family    Chief Complaint: chest pain  HPI: Nathan Frank is a 75 y.o. male with medical history significant of NSTEMI 2016, a.fib w RVR, cirrhosis, depression polysubstance abuse, EtOH abuse,   hepatitis C, DVT, chronic diastolic CHF dilated cardiomyopathy  Presented with Chest pain since last night radiating to left arm  Patient continues to have ongoing diarrhea 7 bowel movements today  Denies drinking any more but states have not taken any of his medications since last week. Chest pain is ongoing currently 7/10 reports some chills. He has very limited mobility reports unable to walk up until today. Reports generalized swelling and abdominal discomfort unchanged from prior . Denies feeling light headed when he sits up.  No associated nausea vomiting fevers  shortness of breath or abdominal complaints  he may have had  diarrhea for past few weeks associated generalized fatigue he is being admitted for this earlier in the and of August no travel sick contacts patient has not been complaining his medications. During his admission C. difficile was negative GI pathogen was negative CT abdomen and pelvis showed liver cirrhosis with thickening of cecum and ascending colon he status post antibiotic treatment. 8/27 Abdominal MRI not showing any lesions concerning for cancer He was charged home with probiotics. Patient was diagnosed with hepatitis C in Dr. Johnnye Sima has been consulted plan to follow up as an outpatient. Patient had had troubles in the past with hypokalemia and hypomagnesemia which required replacement supposed to be on daily magnesium supplementation Re: History of Paroxysmal A. Fib -CHA2DS2 - VASc is 4 - not on anticoagulants secondary to history of GI bleed and alcohol abuse she has not been compliant with his  medication  IN ER:  Temp (24hrs), Avg:98.6 F (37 C), Min:98.6 F (37 C), Max:98.6 F (37 C)  Initially A. fib with RVR heart rate up to 120s to 170s refused aspirin or IV acces initially  still reporting pain 7 out of 10  Magnesium 1.3 potassium 3.3 of 2.1 creatinine 0.87 BNP 463 which is up from prior Dimer 2.97  T scan showing no PE but moderate cardiomegaly with small to moderate pleural effusions and pulmonary edema evidence of pulmonary arterial hypertension cirrhosis and small amount of ascites Following Medications were ordered in ER: Medications  aspirin chewable tablet 324 mg (324 mg Oral Refused 06/09/16 1530)  chlordiazePOXIDE (LIBRIUM) capsule 10 mg (not administered)  magnesium oxide (MAG-OX) tablet 800 mg (not administered)  furosemide (LASIX) injection 40 mg (not administered)  sodium chloride 0.9 % bolus 1,000 mL (1,000 mLs Intravenous New Bag/Given 06/09/16 1530)  morphine 4 MG/ML injection 4 mg (4 mg Intravenous Given 06/09/16 1529)  LORazepam (ATIVAN) injection 1 mg (1 mg Intravenous Given 06/09/16 1529)  iopamidol (ISOVUE-370) 76 % injection (80 mLs  Contrast Given 06/09/16 1620)     Hospitalist was called for admission for Chest Pain  Review of Systems:    Pertinent positives include: chest pain,  Constitutional:  No weight loss, night sweats, Fevers, chills, fatigue, weight loss  HEENT:  No headaches, Difficulty swallowing,Tooth/dental problems,Sore throat,  No sneezing, itching, ear ache, nasal congestion, post nasal drip,  Cardio-vascular:  No  Orthopnea, PND, anasarca, dizziness, palpitations.no Bilateral lower extremity swelling  GI:  No heartburn, indigestion, abdominal pain, nausea, vomiting, diarrhea, change in bowel habits, loss of appetite, melena,  blood in stool, hematemesis Resp:  no shortness of breath at rest. No dyspnea on exertion, No excess mucus, no productive cough, No non-productive cough, No coughing up of blood.No change in color of mucus.No  wheezing. Skin:  no rash or lesions. No jaundice GU:  no dysuria, change in color of urine, no urgency or frequency. No straining to urinate.  No flank pain.  Musculoskeletal:  No joint pain or no joint swelling. No decreased range of motion. No back pain.  Psych:  No change in mood or affect. No depression or anxiety. No memory loss.  Neuro: no localizing neurological complaints, no tingling, no weakness, no double vision, no gait abnormality, no slurred speech, no confusion  As per HPI otherwise 10 point review of systems negative.   Past Medical History: Past Medical History:  Diagnosis Date  . Abnormal TSH   . Acute gastric ulcer   . Acute gastritis with hemorrhage   . Alcohol abuse   . Alcohol dependence ([place]) 02/02/2014  . Anemia   . Cirrhosis ([place]) 06/04/2012  . Depressive disorder 02/01/2014  . Dizziness and giddiness 12/13/2014  . DVT of lower limb, acute ([place]) 06/06/2012  . Essential hypertension   . Fatty liver   . Gallstones   . Gastritis 06/07/2012  . GERD (gastroesophageal reflux disease)   . GI bleed due to NSAIDs 12/13/2014  . Granulomatous gastritis   . Hematuria 06/04/2012  . Hepatitis C   . Hepatocellular carcinoma ([place])   . Heroin abuse    "I haven't done that since I don't know when."  . Heroin overdose 02/20/2014  . LV dysfunction    a. 04/2015: EF 45-50% by cath.  . Neuropathy ([place])   . NSTEMI (non-ST elevated myocardial infarction) ([place])    a. 04/2015 - patent coronaries. Etiology possibly due to coronary spasm versus embolus, stress cardiomyopathy (atypical), and aborted infarction related to plaque rupture with thrombosis and dissolution. Amlodipine started. Not on antiplatelets due to GIB/cirrhosis history.  . Oral thrush 06/05/2012  . Polysubstance abuse    Rare marijuana.  No EtOH x 2 months.   . Prolonged Q-T interval on ECG    a. 12/2014 - treated with magnesium.  . Right knee pain 12/13/2014  . S/P alcohol detoxification 02/02/2014  . SVT  (supraventricular tachycardia) ([place])    a. 12/2014 in setting of GIB, ETOH, NSAIDS, gastritis.  . Symptomatic cholelithiasis 12/15/2013  . Thrombocytopenia ([place])   . Upper GI bleeding 12/13/2014  . Weight loss 06/04/2012   Past Surgical History:  Procedure Laterality Date  . CARDIAC CATHETERIZATION N/A 04/15/2015   Procedure: Left Heart Cath and Coronary Angiography;  Surgeon: Belva Crome, MD;  Location: [facility];  Service: Cardiovascular;  Laterality: N/A;  . CIRCUMCISION    . ESOPHAGOGASTRODUODENOSCOPY  06/07/2012   Procedure: ESOPHAGOGASTRODUODENOSCOPY (EGD);  Surgeon: Milus Banister, MD;  Location: [facility];  Service: Endoscopy;  Laterality: N/A;  may need treatment of varices  . ESOPHAGOGASTRODUODENOSCOPY N/A 12/14/2014   Procedure: ESOPHAGOGASTRODUODENOSCOPY (EGD);  Surgeon: Jerene Bears, MD;  [facility] END[facility];  Service: Endoscopy;  Laterality: N/A;  . TONSILLECTOMY       Social History:  Ambulatory  independently    Reports that he stopped drinking  reports that he has quit smoking. His smoking use included Cigarettes. He has a 2.50 pack-year smoking history. He has never used smokeless tobacco. He reports that he drinks about 16.8 oz of alcohol per week . He reports that he does  not use drugs.  Allergies:  No Known Allergies     Family History:   Family History  Problem Relation Age of Onset  . Stroke Father   . Prostate cancer Brother   . Heart disease Mother     Pacemaker    Medications: Prior to Admission medications   Medication Sig Start Date End Date Taking? Authorizing Provider  amiodarone (PACERONE) 200 MG tablet Take 1 tablet (200 mg total) by mouth 2 (two) times daily. 04/22/16  Yes Kelvin Cellar, MD  Cholecalciferol (VITAMIN D PO) Take 1 tablet by mouth daily.   Yes Historical Provider, MD  furosemide (LASIX) 40 MG tablet Take 0.5 tablets (20 mg total) by mouth daily. Patient taking differently: Take 40 mg by mouth daily.  05/01/16   Yes Eugenie Filler, MD  levothyroxine (SYNTHROID, LEVOTHROID) 50 MCG tablet Take 1 tablet (50 mcg total) by mouth daily before breakfast. 06/03/16  Yes Verlee Monte, MD  metoprolol tartrate (LOPRESSOR) 25 MG tablet Take 0.5 tablets (12.5 mg total) by mouth 2 (two) times daily. Patient taking differently: Take 12.5 mg by mouth daily.  05/01/16  Yes Eugenie Filler, MD  Multiple Vitamin (MULTIVITAMIN WITH MINERALS) TABS tablet Take 1 tablet by mouth daily. 04/21/16  Yes Maryann Mikhail, DO  omeprazole (PRILOSEC) 40 MG capsule Take 1 capsule (40 mg total) by mouth daily. For acid reflux 12/15/14  Yes Thurnell Lose, MD  saccharomyces boulardii (FLORASTOR) 250 MG capsule Take 1 capsule (250 mg total) by mouth 2 (two) times daily. 06/03/16  Yes Verlee Monte, MD  traMADol (ULTRAM) 50 MG tablet Take 50 mg by mouth 3 (three) times daily. 03/04/16  Yes Historical Provider, MD  traZODone (DESYREL) 100 MG tablet Take 100 mg by mouth at bedtime. 02/20/16  Yes Historical Provider, MD  folic acid (FOLVITE) 1 MG tablet Take 1 tablet (1 mg total) by mouth daily. Patient not taking: Reported on 05/27/2016 04/21/16   Velta Addison Mikhail, DO  magnesium oxide (MAG-OX) 400 (241.3 Mg) MG tablet Take 1 tablet (400 mg total) by mouth daily. 06/03/16   Verlee Monte, MD  simethicone (MYLICON) 80 MG chewable tablet Chew 2 tablets (160 mg total) by mouth 4 (four) times daily as needed for flatulence. Patient not taking: Reported on 05/27/2016 05/01/16   Eugenie Filler, MD  spironolactone (ALDACTONE) 50 MG tablet Take 1 tablet (50 mg total) by mouth daily. 06/03/16   Verlee Monte, MD  sucralfate (CARAFATE) 1 g tablet Take 1 tablet (1 g total) by mouth 4 (four) times daily -  with meals and at bedtime. Patient not taking: Reported on 06/09/2016 04/22/16   Kelvin Cellar, MD  thiamine 100 MG tablet Take 1 tablet (100 mg total) by mouth daily. Patient not taking: Reported on 05/27/2016 04/21/16   Cristal Ford, DO    Physical  Exam: Patient Vitals for the past 24 hrs:  BP Temp Temp src Pulse Resp SpO2 Height  06/09/16 1800 113/89 - - 86 - 96 % -  06/09/16 1705 116/86 - - 103 16 97 % -  06/09/16 1600 113/85 - - 113 13 97 % -  06/09/16 1345 - - - (!) 125 15 97 % -  06/09/16 1314 110/88 98.6 F (37 C) Oral (!) 130 17 96 % -  06/09/16 1312 - - - - - - 5\' 11"  (1.803 m)    1. General:  in No Acute distress 2. Psychological: Alert and  Oriented 3. Head/ENT:    Dry Mucous Membranes  Head Non traumatic, neck supple                          Poor Dentition 4. SKIN: decreased Skin turgor,  Skin clean Dry and intact no rash 5. Heart: Regular rate and rhythm no  Murmur, Rub or gallop 6. Lungs:    no wheezes mild  crackles   7. Abdomen: Soft, right upper quadrant tenderness, Non distended 8. Lower extremities: no clubbing, cyanosis, 2+ edema up to the scrotum 9. Neurologically Grossly intact, moving all 4 extremities equally  10. MSK: Normal range of motion   body mass index is unknown because there is no height or weight on file.  Labs on Admission:   Labs on Admission: I have personally reviewed following labs and imaging studies  CBC:  Recent Labs Lab 06/09/16 1500 06/09/16 1521  WBC 4.0  --   NEUTROABS 2.5  --   HGB 12.2* 13.6  HCT 36.8* 40.0  MCV 101.9*  --   PLT 144*  --    Basic Metabolic Panel:  Recent Labs Lab 06/03/16 0435 06/09/16 1500 06/09/16 1521  NA 141 140 143  K 3.6 3.3* 3.3*  CL 107 107 102  CO2 29 26  --   GLUCOSE 84 93 88  BUN <5* <5* <3*  CREATININE 0.63 0.87 0.70  CALCIUM 8.1* 7.9*  --   MG 1.6* 1.3*  --    GFR: Estimated Creatinine Clearance: 101.8 mL/min (by C-G formula based on SCr of 0.8 mg/dL). Liver Function Tests:  Recent Labs Lab 06/09/16 1500  AST 69*  ALT 26  ALKPHOS 67  BILITOT 1.7*  PROT 5.9*  ALBUMIN 2.1*    Recent Labs Lab 06/09/16 1500  LIPASE 23   No results for input(s): AMMONIA in the last 168  hours. Coagulation Profile: No results for input(s): INR, PROTIME in the last 168 hours. Cardiac Enzymes: No results for input(s): CKTOTAL, CKMB, CKMBINDEX, TROPONINI in the last 168 hours. BNP (last 3 results) No results for input(s): PROBNP in the last 8760 hours. HbA1C: No results for input(s): HGBA1C in the last 72 hours. CBG: No results for input(s): GLUCAP in the last 168 hours. Lipid Profile: No results for input(s): CHOL, HDL, LDLCALC, TRIG, CHOLHDL, LDLDIRECT in the last 72 hours. Thyroid Function Tests: No results for input(s): TSH, T4TOTAL, FREET4, T3FREE, THYROIDAB in the last 72 hours. Anemia Panel: No results for input(s): VITAMINB12, FOLATE, FERRITIN, TIBC, IRON, RETICCTPCT in the last 72 hours.  Sepsis Labs: @LABRCNTIP (procalcitonin:4,lacticidven:4) )No results found for this or any previous visit (from the past 240 hour(s)).     UA not ordered  Lab Results  Component Value Date   HGBA1C 4.6 (L) 04/19/2016    Estimated Creatinine Clearance: 101.8 mL/min (by C-G formula based on SCr of 0.8 mg/dL).  BNP (last 3 results) No results for input(s): PROBNP in the last 8760 hours.   ECG REPORT  Independently reviewed Rate: 120  Rhythm: A.fib W RVR RBBB ST&T Change: No acute ischemic changes    QTC 461  There were no vitals filed for this visit.   Cultures:    Component Value Date/Time   SDES BLOOD RIGHT HAND 05/28/2016 1726   SPECREQUEST IN PEDIATRIC BOTTLE 0.5CC 05/28/2016 1726   CULT NO GROWTH 5 DAYS 05/28/2016 1726   REPTSTATUS 06/02/2016 FINAL 05/28/2016 1726     Radiological Exams on Admission: Dg Chest 2 View  Result Date: 06/09/2016 CLINICAL DATA:  Chest pain, diarrhea  and shortness of breath. History of hypertension, hepatitis and cirrhosis. EXAM: CHEST  2 VIEW COMPARISON:  05/30/2016 FINDINGS: Cardiac silhouette is mildly enlarged. No mediastinal or hilar masses or evidence of adenopathy. There is lung base opacity consistent with combination of  small effusions and atelectasis. Infection is possible but felt less likely. Remainder of the lungs is clear. No pneumothorax. Bony thorax is demineralized but intact. IMPRESSION: 1. Mild lung base opacity consistent with small effusions and atelectasis. Consider pneumonia if there are consistent clinical symptoms. 2. Mild cardiomegaly. No evidence of pulmonary edema. No pneumothorax. Electronically Signed   By: Lajean Manes M.D.   On: 06/09/2016 16:41   Ct Angio Chest Pe W Or Wo Contrast  Result Date: 06/09/2016 CLINICAL DATA:  Chest pain radiating to LEFT arm. Shortness of breath. History of atrial fibrillation, hypertension, alcohol abuse, hepatitis-C and hepatic cellular carcinoma. EXAM: CT ANGIOGRAPHY CHEST WITH CONTRAST TECHNIQUE: Multidetector CT imaging of the chest was performed using the standard protocol during bolus administration of intravenous contrast. Multiplanar CT image reconstructions and MIPs were obtained to evaluate the vascular anatomy. CONTRAST:  80 cc Isovue 370 COMPARISON:  Chest radiograph June 09, 2016 at 1351 hours and CT abdomen and pelvis May 28, 2016 FINDINGS: PULMONARY ARTERY: Adequate contrast opacification of the pulmonary artery's. Main pulmonary artery is enlarged, 3.7 cm in transaxial dimension. No pulmonary arterial filling defects to the level of the subsegmental branches. MEDIASTINUM: The heart is moderately enlarged. No RIGHT heart strain. No pericardial effusions. Minimal coronary artery calcifications. Sub cm cardio phrenic angle lymph nodes. No mediastinal lymphadenopathy by CT size criteria. LUNGS: Small to moderate lobulated bilateral pleural effusions. Bronchial wall thickening, predominately in the lung bases. 4 mm ground-glass nodule RIGHT lower lobe, below size followup recommendations. Compressive atelectasis. Old RIGHT posterior rib fractures. SOFT TISSUES AND OSSEOUS STRUCTURES: Multiple tiny gallstones partially imaged in the abdomen. Small amount of  ascites. Nodular liver compatible with history of cirrhosis ; not tailored for evaluation of patient's known hepatic cellular carcinoma. Mild gynecomastia. Sub cm sclerotic focus LEFT posterior T6 vertebral body favoring bone island. Review of the MIP images confirms the above findings. IMPRESSION: No acute pulmonary embolism. Moderate cardiomegaly and small to moderate pleural effusions new from May 28, 2016. Bibasilar bronchial wall thickening favoring pulmonary edema. Enlarged main pulmonary artery associated with pulmonary arterial hypertension. Cirrhosis and small amount of ascites. Electronically Signed   By: Elon Alas M.D.   On: 06/09/2016 16:45    Chart has been reviewed    Assessment/Plan  75 y.o. male with medical history significant of NSTEMI 2016, a.fib w RVR, cirrhosis, depression polysubstance abuse, EtOH abuse,   hepatitis C, DVT, chronic diastolic CHF dilated cardiomyopathy being admitted with A. fib with RVR associated chest pain secondary to medical noncompliance   Present on Admission: . Chest pain no admit cycle cardiac enzymes likely in the setting of A. fib with RVR and fluid overload secondary to medical noncompliance . Alcoholic cirrhosis of liver with ascites ([place]) secondary to alcohol abuse and hepatitis C patient will need to have follow-up with GI it is encouraging that he discontinued drinking   . Atrial fibrillation with RVR (HCC)/Chronic atrial fibrillation (HCC) currently not RVR likely secondary to self discontinuation of her home medications for rate control. Will initiate diltiazem drip and monitor blood pressure closely even ongoing chest pain admit to step down CHA2DS2 - VASc is 4 - not on anticoagulants secondary to history of GI bleed and alcohol abuse she has not  been compliant with his medication . Essential hypertension - will need to restart home medications when able to tolerate . Hypokalemia - replace . Hypomagnesemia- replace . Acute on  chronic diastolic CHF (congestive heart failure) ([place]) secondary to self discontinuation of Lasix will restart as well as spironolactone once able to tolerate. Last echo done in July 2017 preserved EF grade 2 diastolic chf She noted with evidence of fluid overload likely also worsening by low albumin state Other plan as per orders. Diarrhea chronic has had prior workup to this in the past. Would benefit from a GI consult and/or outpatient follow-up  DVT prophylaxis:  SCD she has history of thrombocytopenia we'll repeat CBC Code Status:  as per patient    Family Communication:   Family not at  Bedside    Disposition Plan:   likely will need placement for rehabilitation                                                Would benefit from PT/OT eval prior to DC  ordered                      Care management for medication non-compliance consulted      Admission status:    inpatient      Level of care   SDU      I have spent a total of 57 min on this admission    [place] 06/09/2016, 8:32 PM    [facility]  Pager [phone]   after 2 AM please page floor coverage PA If 7AM-7PM, please contact the day team taking care of the patient  [email or web address]  Password [email or web address]

## 2016-06-09 NOTE — ED Notes (Signed)
Report given to [facility] RN. Admitting MD at bedside.

## 2016-06-09 NOTE — ED Notes (Signed)
Librium and Mag-ox have not arrived from pharmacy. Pharmacy was sent a note earlier about missing med.

## 2016-06-09 NOTE — ED Notes (Signed)
Assumed care of pt

## 2016-06-10 ENCOUNTER — Encounter (HOSPITAL_COMMUNITY): Payer: Self-pay | Admitting: General Practice

## 2016-06-10 DIAGNOSIS — R0789 Other chest pain: Secondary | ICD-10-CM

## 2016-06-10 DIAGNOSIS — I480 Paroxysmal atrial fibrillation: Secondary | ICD-10-CM

## 2016-06-10 DIAGNOSIS — I959 Hypotension, unspecified: Secondary | ICD-10-CM

## 2016-06-10 LAB — CBC
HCT: 31.8 % — ABNORMAL LOW (ref 39.0–52.0)
HEMOGLOBIN: 10.6 g/dL — AB (ref 13.0–17.0)
MCH: 33.8 pg (ref 26.0–34.0)
MCHC: 33.3 g/dL (ref 30.0–36.0)
MCV: 101.3 fL — ABNORMAL HIGH (ref 78.0–100.0)
PLATELETS: 124 10*3/uL — AB (ref 150–400)
RBC: 3.14 MIL/uL — AB (ref 4.22–5.81)
RDW: 16.8 % — ABNORMAL HIGH (ref 11.5–15.5)
WBC: 3.8 10*3/uL — ABNORMAL LOW (ref 4.0–10.5)

## 2016-06-10 LAB — COMPREHENSIVE METABOLIC PANEL
ALK PHOS: 62 U/L (ref 38–126)
ALT: 21 U/L (ref 17–63)
ANION GAP: 6 (ref 5–15)
AST: 55 U/L — ABNORMAL HIGH (ref 15–41)
Albumin: 1.7 g/dL — ABNORMAL LOW (ref 3.5–5.0)
BILIRUBIN TOTAL: 1.5 mg/dL — AB (ref 0.3–1.2)
BUN: <5 mg/dL — ABNORMAL LOW (ref 6–20)
CALCIUM: 7.4 mg/dL — AB (ref 8.9–10.3)
CO2: 26 mmol/L (ref 22–32)
CREATININE: 0.84 mg/dL (ref 0.61–1.24)
Chloride: 107 mmol/L (ref 101–111)
GFR calc non Af Amer: >60 mL/min (ref >60)
GLUCOSE: 90 mg/dL (ref 65–99)
Potassium: 3.1 mmol/L — ABNORMAL LOW (ref 3.5–5.1)
SODIUM: 139 mmol/L (ref 135–145)
TOTAL PROTEIN: 4.9 g/dL — AB (ref 6.5–8.1)

## 2016-06-10 LAB — TROPONIN I
TROPONIN I: 0.03 ng/mL — AB (ref <0.03)
TROPONIN I: 0.03 ng/mL — AB (ref <0.03)
Troponin I: 0.03 ng/mL (ref <0.03)

## 2016-06-10 LAB — PHOSPHORUS: Phosphorus: 3.8 mg/dL (ref 2.5–4.6)

## 2016-06-10 LAB — PROTIME-INR
INR: 1.34
INR: 1.46
PROTHROMBIN TIME: 16.6 s — AB (ref 11.4–15.2)
Prothrombin Time: 17.8 seconds — ABNORMAL HIGH (ref 11.4–15.2)

## 2016-06-10 LAB — MAGNESIUM: MAGNESIUM: 1.6 mg/dL — AB (ref 1.7–2.4)

## 2016-06-10 LAB — TSH: TSH: 24.196 u[IU]/mL — ABNORMAL HIGH (ref 0.350–4.500)

## 2016-06-10 LAB — MRSA PCR SCREENING: MRSA BY PCR: NEGATIVE

## 2016-06-10 MED ORDER — PANCRELIPASE (LIP-PROT-AMYL) 12000-38000 UNITS PO CPEP
12000.0000 [IU] | ORAL_CAPSULE | Freq: Three times a day (TID) | ORAL | Status: DC
  Administered 2016-06-10 – 2016-06-16 (×17): 12000 [IU] via ORAL
  Filled 2016-06-10 (×17): qty 1

## 2016-06-10 MED ORDER — POTASSIUM CHLORIDE CRYS ER 20 MEQ PO TBCR
40.0000 meq | EXTENDED_RELEASE_TABLET | Freq: Once | ORAL | Status: AC
  Administered 2016-06-10: 40 meq via ORAL
  Filled 2016-06-10: qty 2

## 2016-06-10 MED ORDER — MAGNESIUM SULFATE 2 GM/50ML IV SOLN
2.0000 g | Freq: Once | INTRAVENOUS | Status: AC
  Administered 2016-06-10: 2 g via INTRAVENOUS
  Filled 2016-06-10: qty 50

## 2016-06-10 NOTE — Consult Note (Signed)
Reason for Consult: chest pain   Referring Physician: Dr. Erlinda Hong   PCP:  Thressa Sheller, MD  Primary Cardiologist:Dr. Deatra Robinson SLAYDE BRAULT is an 75 y.o. male.    Chief Complaint:    HPI:   75 y.o. year old male with a history of atrial fibrillation not on anticoagulation due to hx of GI bleeding, RBBB, SVT, liver cirrhosis, HCV, alcohol abuse, heroin abuse, marijuana abuse, DVT, gastric ulcer, medication noncompliance.   History of Paroxysmal A. Fib -CHA2DS2 - VASc is 4 - not on anticoagulants secondary to history of GI bleed and alcohol abuse she has not been compliant with his medication  NSTEMI 04/2015 w/ peak troponin 7.72, nl cors at cath with Dr Tamala Julian suggesting "Elevated myocardial markers for injury raises the possibility of coronary spasm, embolus, stress cardiomyopathy (atypical), and aborted infarction related to plaque rupture with thrombosis and dissolution." EF 45-50% w/ diffuse HK not involving the apex and mildly elevated LVEDP.   Admitted 04/18/16 with diarrhea hypotension, SVT PA fib flutter and atrial tach on amiodarone by Dr. Lovena Le.   Was admitted in August with weakness and diarrhea.   Noted he was not taking his amiodarone.   He was tachycardic read as Sinus tach but could be flutter.   It was resumed with hospitalization but he has not been taking.   Now admitted yesterday with diarrhea, chest pain radiating to lt arm and side.   Also in A fib. Rate 120-170.   Refused IV sticks and ASA initially.  He had not taken meds at home for at least 3 reasons,  One afraid causing diarrhea, two he takes the meds they come right out so what is point, and three he was waiting for his meds.   His chest pain only began yesterday.  He has some SOB but edema to hips, with scrotal edema as well.  He does not understand the salt and edema issue.    CXR with mild lung base opacity consistent with sm. Effusions and atelectasis, ? PNA.  Mild cardiomegaly.  No Pul. Edema.  CTA of  chest without PE and mod. Cardiomegaly with sm to mod pl. Effusions.  Enlarged main pulmonary artery associated with pulmonary arterial hypertension.  Cirrhosis and sm. Amount of ascites.    Labs:  BNP 463,  Troponin 0.02, K+ 3.3 Mag. 1.3 (rec'd 2 Gm IV mag. And now on PO  T. Bili 1.7,  EKG a fib with RVR and RBBB no acute changes in ST,   Last echo 04/20/16 with normal EF 55-60% G2DD, mod TR, LA mod dilated.  PA pk pressure 33 mmHG   Past Medical History:  Diagnosis Date  . Abnormal TSH   . Acute gastric ulcer   . Acute gastritis with hemorrhage   . Alcohol abuse   . Alcohol dependence ([place]) 02/02/2014  . Anemia   . Cirrhosis ([place]) 06/04/2012  . Depressive disorder 02/01/2014  . Dizziness and giddiness 12/13/2014  . DVT of lower limb, acute ([place]) 06/06/2012  . Essential hypertension   . Fatty liver   . Gallstones   . Gastritis 06/07/2012  . GERD (gastroesophageal reflux disease)   . GI bleed due to NSAIDs 12/13/2014  . Granulomatous gastritis   . Hematuria 06/04/2012  . Hepatitis C   . Hepatocellular carcinoma ([place])   . Heroin abuse    "I haven't done that since I don't know when."  . Heroin overdose 02/20/2014  . LV dysfunction  a. 04/2015: EF 45-50% by cath.  . Neuropathy ([place])   . NSTEMI (non-ST elevated myocardial infarction) ([place])    a. 04/2015 - patent coronaries. Etiology possibly due to coronary spasm versus embolus, stress cardiomyopathy (atypical), and aborted infarction related to plaque rupture with thrombosis and dissolution. Amlodipine started. Not on antiplatelets due to GIB/cirrhosis history.  . Oral thrush 06/05/2012  . Polysubstance abuse    Rare marijuana.  No EtOH x 2 months.   . Prolonged Q-T interval on ECG    a. 12/2014 - treated with magnesium.  . Right knee pain 12/13/2014  . S/P alcohol detoxification 02/02/2014  . SVT (supraventricular tachycardia) ([place])    a. 12/2014 in setting of GIB, ETOH, NSAIDS, gastritis.  . Symptomatic cholelithiasis 12/15/2013  .  Thrombocytopenia ([place])   . Upper GI bleeding 12/13/2014  . Weight loss 06/04/2012    Past Surgical History:  Procedure Laterality Date  . CARDIAC CATHETERIZATION N/A 04/15/2015   Procedure: Left Heart Cath and Coronary Angiography;  Surgeon: Belva Crome, MD;  Location: [facility];  Service: Cardiovascular;  Laterality: N/A;  . CIRCUMCISION    . ESOPHAGOGASTRODUODENOSCOPY  06/07/2012   Procedure: ESOPHAGOGASTRODUODENOSCOPY (EGD);  Surgeon: Milus Banister, MD;  Location: [facility];  Service: Endoscopy;  Laterality: N/A;  may need treatment of varices  . ESOPHAGOGASTRODUODENOSCOPY N/A 12/14/2014   Procedure: ESOPHAGOGASTRODUODENOSCOPY (EGD);  Surgeon: Jerene Bears, MD;  Location[facility] ENDOSCOPY;  Service: Endoscopy;  Laterality: N/A;  . TONSILLECTOMY      Family History  Problem Relation Age of Onset  . Stroke Father   . Prostate cancer Brother   . Heart disease Mother     Pacemaker   Social History:  reports that he has quit smoking. His smoking use included Cigarettes. He has a 2.50 pack-year smoking history. He has never used smokeless tobacco. He reports that he drinks about 16.8 oz of alcohol per week . He reports that he does not use drugs.  Allergies: No Known Allergies  OUTPATIENT MEDICATIONS: No current facility-administered medications on file prior to encounter.    Current Outpatient Prescriptions on File Prior to Encounter  Medication Sig Dispense Refill  . amiodarone (PACERONE) 200 MG tablet Take 1 tablet (200 mg total) by mouth 2 (two) times daily. 60 tablet 1  . furosemide (LASIX) 40 MG tablet Take 0.5 tablets (20 mg total) by mouth daily. (Patient taking differently: Take 40 mg by mouth daily. ) 30 tablet 0  . levothyroxine (SYNTHROID, LEVOTHROID) 50 MCG tablet Take 1 tablet (50 mcg total) by mouth daily before breakfast. 30 tablet 0  . metoprolol tartrate (LOPRESSOR) 25 MG tablet Take 0.5 tablets (12.5 mg total) by mouth 2 (two) times daily. (Patient taking  differently: Take 12.5 mg by mouth daily. ) 60 tablet 0  . Multiple Vitamin (MULTIVITAMIN WITH MINERALS) TABS tablet Take 1 tablet by mouth daily. 30 tablet 0  . omeprazole (PRILOSEC) 40 MG capsule Take 1 capsule (40 mg total) by mouth daily. For acid reflux 60 capsule 2  . saccharomyces boulardii (FLORASTOR) 250 MG capsule Take 1 capsule (250 mg total) by mouth 2 (two) times daily. 60 capsule 0  . traMADol (ULTRAM) 50 MG tablet Take 50 mg by mouth 3 (three) times daily.  0  . traZODone (DESYREL) 100 MG tablet Take 100 mg by mouth at bedtime.  1  . folic acid (FOLVITE) 1 MG tablet Take 1 tablet (1 mg total) by mouth daily. (Patient not taking: Reported on 05/27/2016)  30 tablet 0  . magnesium oxide (MAG-OX) 400 (241.3 Mg) MG tablet Take 1 tablet (400 mg total) by mouth daily. 30 tablet 0  . simethicone (MYLICON) 80 MG chewable tablet Chew 2 tablets (160 mg total) by mouth 4 (four) times daily as needed for flatulence. (Patient not taking: Reported on 05/27/2016) 30 tablet 0  . spironolactone (ALDACTONE) 50 MG tablet Take 1 tablet (50 mg total) by mouth daily. 30 tablet 0  . sucralfate (CARAFATE) 1 g tablet Take 1 tablet (1 g total) by mouth 4 (four) times daily -  with meals and at bedtime. (Patient not taking: Reported on 06/09/2016) 30 tablet 0  . thiamine 100 MG tablet Take 1 tablet (100 mg total) by mouth daily. (Patient not taking: Reported on 05/27/2016) 30 tablet 0   CURRENT MEDICATIONS: Scheduled Meds: . aspirin  324 mg Oral Once  . chlordiazePOXIDE  10 mg Oral Once  . enoxaparin (LOVENOX) injection  40 mg Subcutaneous Daily  . folic acid  1 mg Oral Daily  . furosemide      . furosemide  40 mg Intravenous Q12H  . guaiFENesin  600 mg Oral BID  . levothyroxine  50 mcg Oral QAC breakfast  . magnesium oxide  400 mg Oral Daily  . metoprolol tartrate  12.5 mg Oral BID  . saccharomyces boulardii  250 mg Oral BID  . sodium chloride flush  3 mL Intravenous Q12H  . spironolactone  50 mg Oral Daily   . sucralfate  1 g Oral TID WC & HS  . thiamine  100 mg Oral Daily   Continuous Infusions:  PRN Meds:.acetaminophen **OR** acetaminophen, HYDROcodone-acetaminophen, levalbuterol, ondansetron **OR** ondansetron (ZOFRAN) IV   Results for orders placed or performed during the hospital encounter of 06/09/16 (from the past 48 hour(s))  CBC with Differential     Status: Abnormal   Collection Time: 06/09/16  3:00 PM  Result Value Ref Range   WBC 4.0 4.0 - 10.5 K/uL   RBC 3.61 (L) 4.22 - 5.81 MIL/uL   Hemoglobin 12.2 (L) 13.0 - 17.0 g/dL   HCT 36.8 (L) 39.0 - 52.0 %   MCV 101.9 (H) 78.0 - 100.0 fL   MCH 33.8 26.0 - 34.0 pg   MCHC 33.2 30.0 - 36.0 g/dL   RDW 16.5 (H) 11.5 - 15.5 %   Platelets 144 (L) 150 - 400 K/uL   Neutrophils Relative % 61 %   Neutro Abs 2.5 1.7 - 7.7 K/uL   Lymphocytes Relative 23 %   Lymphs Abs 0.9 0.7 - 4.0 K/uL   Monocytes Relative 15 %   Monocytes Absolute 0.6 0.1 - 1.0 K/uL   Eosinophils Relative 0 %   Eosinophils Absolute 0.0 0.0 - 0.7 K/uL   Basophils Relative 1 %   Basophils Absolute 0.0 0.0 - 0.1 K/uL  Comprehensive metabolic panel     Status: Abnormal   Collection Time: 06/09/16  3:00 PM  Result Value Ref Range   Sodium 140 135 - 145 mmol/L   Potassium 3.3 (L) 3.5 - 5.1 mmol/L   Chloride 107 101 - 111 mmol/L   CO2 26 22 - 32 mmol/L   Glucose, Bld 93 65 - 99 mg/dL   BUN <5 (L) 6 - 20 mg/dL   Creatinine, Ser 0.87 0.61 - 1.24 mg/dL   Calcium 7.9 (L) 8.9 - 10.3 mg/dL   Total Protein 5.9 (L) 6.5 - 8.1 g/dL   Albumin 2.1 (L) 3.5 - 5.0 g/dL   AST 69 (  H) 15 - 41 U/L   ALT 26 17 - 63 U/L   Alkaline Phosphatase 67 38 - 126 U/L   Total Bilirubin 1.7 (H) 0.3 - 1.2 mg/dL   GFR calc non Af Amer >60 >60 mL/min   GFR calc Af Amer >60 >60 mL/min    Comment: (NOTE) The eGFR has been calculated using the CKD EPI equation. This calculation has not been validated in all clinical situations. eGFR's persistently <60 mL/min signify possible Chronic  Kidney Disease.    Anion gap 7 5 - 15  Lipase, blood     Status: None   Collection Time: 06/09/16  3:00 PM  Result Value Ref Range   Lipase 23 11 - 51 U/L  Magnesium     Status: Abnormal   Collection Time: 06/09/16  3:00 PM  Result Value Ref Range   Magnesium 1.3 (L) 1.7 - 2.4 mg/dL  Brain natriuretic peptide     Status: Abnormal   Collection Time: 06/09/16  3:00 PM  Result Value Ref Range   B Natriuretic Peptide 463.2 (H) 0.0 - 100.0 pg/mL  D-dimer, quantitative     Status: Abnormal   Collection Time: 06/09/16  3:00 PM  Result Value Ref Range   D-Dimer, Quant 2.97 (H) 0.00 - 0.50 ug/mL-FEU    Comment: (NOTE) At the manufacturer cut-off of 0.50 ug/mL FEU, this assay has been documented to exclude PE with a sensitivity and negative predictive value of 97 to 99%.  At this time, this assay has not been approved by the FDA to exclude DVT/VTE. Results should be correlated with clinical presentation.   I-Stat Troponin, ED (not at [facility])     Status: None   Collection Time: 06/09/16  3:19 PM  Result Value Ref Range   Troponin i, poc 0.02 0.00 - 0.08 ng/mL   Comment 3            Comment: Due to the release kinetics of cTnI, a negative result within the first hours of the onset of symptoms does not rule out myocardial infarction with certainty. If myocardial infarction is still suspected, repeat the test at appropriate intervals.   I-stat Chem 8, ED     Status: Abnormal   Collection Time: 06/09/16  3:21 PM  Result Value Ref Range   Sodium 143 135 - 145 mmol/L   Potassium 3.3 (L) 3.5 - 5.1 mmol/L   Chloride 102 101 - 111 mmol/L   BUN <3 (L) 6 - 20 mg/dL   Creatinine, Ser 0.70 0.61 - 1.24 mg/dL   Glucose, Bld 88 65 - 99 mg/dL   Calcium, Ion 0.99 (L) 1.15 - 1.40 mmol/L   TCO2 27 0 - 100 mmol/L   Hemoglobin 13.6 13.0 - 17.0 g/dL   HCT 40.0 39.0 - 52.0 %  POC occult blood, ED Provider will collect     Status: None   Collection Time: 06/09/16  3:58 PM  Result Value Ref Range    Fecal Occult Bld NEGATIVE NEGATIVE  Ethanol     Status: None   Collection Time: 06/09/16  5:05 PM  Result Value Ref Range   Alcohol, Ethyl (B) <5 <5 mg/dL    Comment:        LOWEST DETECTABLE LIMIT FOR SERUM ALCOHOL IS 5 mg/dL FOR MEDICAL PURPOSES ONLY   Urinalysis, Routine w reflex microscopic     Status: Abnormal   Collection Time: 06/09/16  8:51 PM  Result Value Ref Range   Color, Urine AMBER (A) YELLOW  Comment: BIOCHEMICALS MAY BE AFFECTED BY COLOR   APPearance CLEAR CLEAR   Specific Gravity, Urine 1.005 1.005 - 1.030   pH 7.5 5.0 - 8.0   Glucose, UA NEGATIVE NEGATIVE mg/dL   Hgb urine dipstick NEGATIVE NEGATIVE   Bilirubin Urine SMALL (A) NEGATIVE   Ketones, ur 15 (A) NEGATIVE mg/dL   Protein, ur NEGATIVE NEGATIVE mg/dL   Nitrite NEGATIVE NEGATIVE   Leukocytes, UA NEGATIVE NEGATIVE    Comment: MICROSCOPIC NOT DONE ON URINES WITH NEGATIVE PROTEIN, BLOOD, LEUKOCYTES, NITRITE, OR GLUCOSE <1000 mg/dL.  Urine rapid drug screen (hosp performed)     Status: Abnormal   Collection Time: 06/09/16  8:59 PM  Result Value Ref Range   Opiates POSITIVE (A) NONE DETECTED   Cocaine NONE DETECTED NONE DETECTED   Benzodiazepines POSITIVE (A) NONE DETECTED   Amphetamines NONE DETECTED NONE DETECTED   Tetrahydrocannabinol NONE DETECTED NONE DETECTED   Barbiturates NONE DETECTED NONE DETECTED    Comment:        DRUG SCREEN FOR MEDICAL PURPOSES ONLY.  IF CONFIRMATION IS NEEDED FOR ANY PURPOSE, NOTIFY LAB WITHIN 5 DAYS.        LOWEST DETECTABLE LIMITS FOR URINE DRUG SCREEN Drug Class       Cutoff (ng/mL) Amphetamine      1000 Barbiturate      200 Benzodiazepine   409 Tricyclics       811 Opiates          300 Cocaine          300 THC              50   MRSA PCR Screening     Status: None   Collection Time: 06/09/16 10:30 PM  Result Value Ref Range   MRSA by PCR NEGATIVE NEGATIVE    Comment:        The GeneXpert MRSA Assay (FDA approved for NASAL specimens only), is one  component of a comprehensive MRSA colonization surveillance program. It is not intended to diagnose MRSA infection nor to guide or monitor treatment for MRSA infections.   Troponin I     Status: Abnormal   Collection Time: 06/09/16 10:53 PM  Result Value Ref Range   Troponin I 0.03 (HH) <0.03 ng/mL    Comment: CRITICAL RESULT CALLED TO, READ BACK BY AND VERIFIED WITH: C.FLETCHER,RN 0129 06/10/16 M.CAMPBELL   Protime-INR     Status: Abnormal   Collection Time: 06/09/16 11:04 PM  Result Value Ref Range   Prothrombin Time 16.6 (H) 11.4 - 15.2 seconds   INR 1.34    Dg Chest 2 View  Result Date: 06/09/2016 CLINICAL DATA:  Chest pain, diarrhea and shortness of breath. History of hypertension, hepatitis and cirrhosis. EXAM: CHEST  2 VIEW COMPARISON:  05/30/2016 FINDINGS: Cardiac silhouette is mildly enlarged. No mediastinal or hilar masses or evidence of adenopathy. There is lung base opacity consistent with combination of small effusions and atelectasis. Infection is possible but felt less likely. Remainder of the lungs is clear. No pneumothorax. Bony thorax is demineralized but intact. IMPRESSION: 1. Mild lung base opacity consistent with small effusions and atelectasis. Consider pneumonia if there are consistent clinical symptoms. 2. Mild cardiomegaly. No evidence of pulmonary edema. No pneumothorax. Electronically Signed   By: Lajean Manes M.D.   On: 06/09/2016 16:41   Ct Angio Chest Pe W Or Wo Contrast  Result Date: 06/09/2016 CLINICAL DATA:  Chest pain radiating to LEFT arm. Shortness of breath. History of atrial fibrillation, hypertension,  alcohol abuse, hepatitis-C and hepatic cellular carcinoma. EXAM: CT ANGIOGRAPHY CHEST WITH CONTRAST TECHNIQUE: Multidetector CT imaging of the chest was performed using the standard protocol during bolus administration of intravenous contrast. Multiplanar CT image reconstructions and MIPs were obtained to evaluate the vascular anatomy. CONTRAST:  80 cc  Isovue 370 COMPARISON:  Chest radiograph June 09, 2016 at 1351 hours and CT abdomen and pelvis May 28, 2016 FINDINGS: PULMONARY ARTERY: Adequate contrast opacification of the pulmonary artery's. Main pulmonary artery is enlarged, 3.7 cm in transaxial dimension. No pulmonary arterial filling defects to the level of the subsegmental branches. MEDIASTINUM: The heart is moderately enlarged. No RIGHT heart strain. No pericardial effusions. Minimal coronary artery calcifications. Sub cm cardio phrenic angle lymph nodes. No mediastinal lymphadenopathy by CT size criteria. LUNGS: Small to moderate lobulated bilateral pleural effusions. Bronchial wall thickening, predominately in the lung bases. 4 mm ground-glass nodule RIGHT lower lobe, below size followup recommendations. Compressive atelectasis. Old RIGHT posterior rib fractures. SOFT TISSUES AND OSSEOUS STRUCTURES: Multiple tiny gallstones partially imaged in the abdomen. Small amount of ascites. Nodular liver compatible with history of cirrhosis ; not tailored for evaluation of patient's known hepatic cellular carcinoma. Mild gynecomastia. Sub cm sclerotic focus LEFT posterior T6 vertebral body favoring bone island. Review of the MIP images confirms the above findings. IMPRESSION: No acute pulmonary embolism. Moderate cardiomegaly and small to moderate pleural effusions new from May 28, 2016. Bibasilar bronchial wall thickening favoring pulmonary edema. Enlarged main pulmonary artery associated with pulmonary arterial hypertension. Cirrhosis and small amount of ascites. Electronically Signed   By: Elon Alas M.D.   On: 06/09/2016 16:45    WER:[id number]:PY colds or fevers, no weight changes Skin:no rashes or ulcers, bruising along abd.   HEENT:no blurred vision, no congestion CV:see HPI PUL:see HPI GI:+ diarrhea soft stools not watery, 6 or so a day.  No constipation ? Melena he stated some stools were black, no indigestion GU:no hematuria, no  dysuria MS:no joint pain, no claudication, + edema of lower ext.  Neuro:no syncope, no lightheadedness Endo:no diabetes, + thyroid disease      Blood pressure (!) 88/63, pulse 77, temperature 98.8 F (37.1 C), temperature source Axillary, resp. rate 15, height 6' (1.829 m), weight 235 lb 8 oz (106.8 kg), SpO2 98 %.  Wt Readings from Last 3 Encounters:  06/10/16 235 lb 8 oz (106.8 kg)  06/03/16 248 lb 3.8 oz (112.6 kg)  05/01/16 229 lb 9.6 oz (104.1 kg)    PE: General:Pleasant affect, NAD Skin:Warm and dry, brisk capillary refill HEENT:normocephalic, sclera clear, mucus membranes moist Neck:supple, mild JVD, no bruits  Heart:S1S2 irreg irreg without murmur, gallup, rub or click Lungs:clear, ant without rales, rhonchi, or wheezes PPJ:KDTOI, soft, mild tenderness, + BS, do not palpate liver spleen or masses Ext:3-4+ lower ext edema, 1+ pedal pulses, 2+ radial pulses Neuro:alert and oriented X 3, MAE, follows commands, + facial symmetry  Assessment/Plan Active Problems:   Essential hypertension   Atrial fibrillation with RVR (HCC)   Chest pain   Chronic atrial fibrillation (HCC)   Hypokalemia   Alcoholic cirrhosis of liver with ascites (HCC)   Hypomagnesemia   Acute on chronic diastolic CHF (congestive heart failure) (HCC)  Chest pain with neg. MI normal cors 1 year ago.  Now cardiomegaly on CT of chest.  Last echo in July with normal EF.  Pain related to RVR with Afib. troponins neg X 2  PAF now in a fib was also in last month.  Not on  amiodarone.  On BB.  Pt has not had except with hospitalizations.  But with continuous a fib rate poorly controlled adding to edema.  Per Dr. Lovena Le (EP) in July with his hypotension and SVT, PA fib, and atrial tach it was felt amiodarone was best choice. May need to resume.  Dr. Harrington Challenger to see.    Currently on lopressor 12.5 BID   Hypotension  dilt drip is off, current rate is in the 35H and BP 88 systolic.  This has been ongoing problem with BB,  ACE was stopped in July.    Hypothyroid with TSH 2 weeks ago at 29.9  New TSH is 24.196   Cardiomegaly ? Recheck echo   Edema continue diuretic may need to increase dose.    Hypokalemia replace rec'd 40 meq IV yesterday  Per IM.    hypomagnesium replace on oral dose but this too can cause diarrhea. Per IM  Cecilie Kicks  Nurse Practitioner Certified Reading Pager [phone] or after 5pm or weekends call [phone] 06/10/2016, 8:16 AM    Pt seen and examined  I am familiar with him from clinic  75 yo with Hx of RBBB, atrial fib (not on anticoag), SVT Normal coronary arteries in 2016 LVEF 45 to 50%   Presents with CP, diarrhea x 2 wk and afib   Off meds   On exam:  Lungs with decreased BS at bases  Cardac exam RRR  Ext with 2+edema Troponins are negative  Alb is 1.7 TSH is 24.2 Curr CP free   I agree with IV lasix  Volume increase multifactorial (meds, albumin, thyroid) Continue telemtry  Afib exacerbated by all of above I would hold on echo for now  Consider limited once diuresed , could argue only if does not improve  Has mult metabolic issues active. IM to eval GI problems  [name]

## 2016-06-10 NOTE — Progress Notes (Signed)
PROGRESS NOTE  Nathan Frank [id number] DOB: 1941-02-17 DOA: 06/09/2016 PCP: Thressa Sheller, MD  HPI/Recap of past 24 hours:  Generalized edema, in afib, denies chest pain or sob at rest, no cough, no fever, wife at bedside  Assessment/Plan: Active Problems:   Essential hypertension   Atrial fibrillation with RVR (HCC)   Chest pain   Chronic atrial fibrillation (HCC)   Hypokalemia   Alcoholic cirrhosis of liver with ascites (HCC)   Hypomagnesemia   Acute on chronic diastolic CHF (congestive heart failure) (HCC)    Chest pain: currently chest pain free, troponin mild and flat, ekg afib/chronic RBBB, QTC prolongation, no acute changes, on asa/betablocker, ldl 36, not a candidate for statin with chronic liver issues. Cardiology following  Afib/rvr: briefly on cardizem drip initially on presentation, now on betablocker, rate controlled, CHADSvasc score 3 with age 23, h/o chf, he does has h/o gi bleed and h/o alcohol and substance abuse, has not been on anticoagulation, may not be a good candidate for amiodarone due to chronic liver disease, meds adjustment per cardiology  Acute on chronic diastolic chf exacerbation: edematous, on lasix/spironolactone, also has low albumin  Hypok/hypomag: replace k/mag  Chronic diarrhea: recent stool study negative for cdiff and negative GI prc panel, trial of pancreatic enzyme supplement for now, continue outpatient GI follow up  Hepc/cirrhosis/?hcc: need outpatient GI/ID followup  Hypothyroidism: continue synthroid, tsh significantly elevated, need education for meds adherence  H/o DVT: not on anticoagulation due to noncompliance and gi bleed  Medication noncompliance/alcohol and polysubstance abuse per chart review h/o heroin use as well  Deconditioning: patient report no driving, does not walk, he stay in bed and watch TV all day for the last 1-2 months, he report significant dyspnea when he walks a few steps, pt recommended home  health  Code Status: full  Family Communication: patient and wife at bedside  Disposition Plan: remain in the hospital a few days, eventually home with home health   Consultants:  cardiology  Procedures:  none  Antibiotics:  none   Objective: BP 104/69   Pulse 77   Temp 98.8 F (37.1 C) (Axillary)   Resp 15   Ht 6' (1.829 m)   Wt 106.8 kg (235 lb 8 oz)   SpO2 98%   BMI 31.94 kg/m   Intake/Output Summary (Last 24 hours) at 06/10/16 0834 Last data filed at 06/09/16 2300  Gross per 24 hour  Intake              240 ml  Output                0 ml  Net              240 ml   Filed Weights   06/09/16 2237 06/10/16 0536  Weight: 106.1 kg (234 lb) 106.8 kg (235 lb 8 oz)    Exam:   General:  Weak but NAD  Cardiovascular: IRRR  Respiratory: crackles at baseline  Abdomen: Soft/ND/NT, positive BS  Musculoskeletal: diffuse pitting  Edema  Neuro: aaox3  Data Reviewed: Basic Metabolic Panel:  Recent Labs Lab 06/09/16 1500 06/09/16 1521  NA 140 143  K 3.3* 3.3*  CL 107 102  CO2 26  --   GLUCOSE 93 88  BUN <5* <3*  CREATININE 0.87 0.70  CALCIUM 7.9*  --   MG 1.3*  --    Liver Function Tests:  Recent Labs Lab 06/09/16 1500  AST 69*  ALT 26  ALKPHOS 67  BILITOT 1.7*  PROT 5.9*  ALBUMIN 2.1*    Recent Labs Lab 06/09/16 1500  LIPASE 23   No results for input(s): AMMONIA in the last 168 hours. CBC:  Recent Labs Lab 06/09/16 1500 06/09/16 1521  WBC 4.0  --   NEUTROABS 2.5  --   HGB 12.2* 13.6  HCT 36.8* 40.0  MCV 101.9*  --   PLT 144*  --    Cardiac Enzymes:    Recent Labs Lab 06/09/16 2253  TROPONINI 0.03*   BNP (last 3 results)  Recent Labs  04/18/16 1855 04/29/16 0305 06/09/16 1500  BNP 375.8* 413.5* 463.2*    ProBNP (last 3 results) No results for input(s): PROBNP in the last 8760 hours.  CBG: No results for input(s): GLUCAP in the last 168 hours.  Recent Results (from the past 240 hour(s))  MRSA PCR  Screening     Status: None   Collection Time: 06/09/16 10:30 PM  Result Value Ref Range Status   MRSA by PCR NEGATIVE NEGATIVE Final    Comment:        The GeneXpert MRSA Assay (FDA approved for NASAL specimens only), is one component of a comprehensive MRSA colonization surveillance program. It is not intended to diagnose MRSA infection nor to guide or monitor treatment for MRSA infections.      Studies: Dg Chest 2 View  Result Date: 06/09/2016 CLINICAL DATA:  Chest pain, diarrhea and shortness of breath. History of hypertension, hepatitis and cirrhosis. EXAM: CHEST  2 VIEW COMPARISON:  05/30/2016 FINDINGS: Cardiac silhouette is mildly enlarged. No mediastinal or hilar masses or evidence of adenopathy. There is lung base opacity consistent with combination of small effusions and atelectasis. Infection is possible but felt less likely. Remainder of the lungs is clear. No pneumothorax. Bony thorax is demineralized but intact. IMPRESSION: 1. Mild lung base opacity consistent with small effusions and atelectasis. Consider pneumonia if there are consistent clinical symptoms. 2. Mild cardiomegaly. No evidence of pulmonary edema. No pneumothorax. Electronically Signed   By: Lajean Manes M.D.   On: 06/09/2016 16:41   Ct Angio Chest Pe W Or Wo Contrast  Result Date: 06/09/2016 CLINICAL DATA:  Chest pain radiating to LEFT arm. Shortness of breath. History of atrial fibrillation, hypertension, alcohol abuse, hepatitis-C and hepatic cellular carcinoma. EXAM: CT ANGIOGRAPHY CHEST WITH CONTRAST TECHNIQUE: Multidetector CT imaging of the chest was performed using the standard protocol during bolus administration of intravenous contrast. Multiplanar CT image reconstructions and MIPs were obtained to evaluate the vascular anatomy. CONTRAST:  80 cc Isovue 370 COMPARISON:  Chest radiograph June 09, 2016 at 1351 hours and CT abdomen and pelvis May 28, 2016 FINDINGS: PULMONARY ARTERY: Adequate contrast  opacification of the pulmonary artery's. Main pulmonary artery is enlarged, 3.7 cm in transaxial dimension. No pulmonary arterial filling defects to the level of the subsegmental branches. MEDIASTINUM: The heart is moderately enlarged. No RIGHT heart strain. No pericardial effusions. Minimal coronary artery calcifications. Sub cm cardio phrenic angle lymph nodes. No mediastinal lymphadenopathy by CT size criteria. LUNGS: Small to moderate lobulated bilateral pleural effusions. Bronchial wall thickening, predominately in the lung bases. 4 mm ground-glass nodule RIGHT lower lobe, below size followup recommendations. Compressive atelectasis. Old RIGHT posterior rib fractures. SOFT TISSUES AND OSSEOUS STRUCTURES: Multiple tiny gallstones partially imaged in the abdomen. Small amount of ascites. Nodular liver compatible with history of cirrhosis ; not tailored for evaluation of patient's known hepatic cellular carcinoma. Mild gynecomastia. Sub cm sclerotic focus LEFT posterior T6 vertebral body  favoring bone island. Review of the MIP images confirms the above findings. IMPRESSION: No acute pulmonary embolism. Moderate cardiomegaly and small to moderate pleural effusions new from May 28, 2016. Bibasilar bronchial wall thickening favoring pulmonary edema. Enlarged main pulmonary artery associated with pulmonary arterial hypertension. Cirrhosis and small amount of ascites. Electronically Signed   By: Elon Alas M.D.   On: 06/09/2016 16:45    Scheduled Meds: . aspirin  324 mg Oral Once  . chlordiazePOXIDE  10 mg Oral Once  . enoxaparin (LOVENOX) injection  40 mg Subcutaneous Daily  . folic acid  1 mg Oral Daily  . furosemide      . furosemide  40 mg Intravenous Q12H  . guaiFENesin  600 mg Oral BID  . levothyroxine  50 mcg Oral QAC breakfast  . magnesium oxide  400 mg Oral Daily  . metoprolol tartrate  12.5 mg Oral BID  . saccharomyces boulardii  250 mg Oral BID  . sodium chloride flush  3 mL  Intravenous Q12H  . spironolactone  50 mg Oral Daily  . sucralfate  1 g Oral TID WC & HS  . thiamine  100 mg Oral Daily    Continuous Infusions:    Time spent: 81mins  Zaniah Titterington MD, PhD  [facility] Pager [phone]. If 7PM-7AM, please contact night-coverage at [email or web address], password [facility] 06/10/2016, 8:34 AM  LOS: 1 day

## 2016-06-10 NOTE — Progress Notes (Signed)
Occupational Therapy Evaluation Patient Details Name: Nathan Frank MRN: [id number] DOB: 08-Dec-1940 Today's Date: 06/10/2016    History of Present Illness 75 y.o. male with medical history significant of NSTEMI 2016, a.fib w RVR, cirrhosis, depression polysubstance abuse, EtOH abuse,   hepatitis C, DVT, chronic diastolic CHF dilated cardiomyopathy Presented with Chest pain since last night radiating to left arm, ongoing diarrhea.   Clinical Impression   Patient presents to OT with decreased ADL independence and safety due to the deficits listed below. HE will benefit from skilled OT to maximize function and to facilitate discharge to the venue listed below. OT will follow.    Follow Up Recommendations  Home health OT;Supervision/Assistance - 24 hour    Equipment Recommendations  None recommended by OT    Recommendations for Other Services       Precautions / Restrictions Precautions Precautions: Fall Restrictions Weight Bearing Restrictions: No      Mobility Bed Mobility Overal bed mobility: Needs Assistance Bed Mobility: Supine to Sit     Supine to sit: Min assist;HOB elevated        Transfers Overall transfer level: Needs assistance Equipment used: Rolling walker (2 wheeled) Transfers: Sit to/from Stand Sit to Stand: Min assist         General transfer comment: cues hand placement, A to power up, initial steadying A    Balance                                            ADL Overall ADL's : Needs assistance/impaired Eating/Feeding: Set up;Sitting   Grooming: Wash/dry hands;Wash/dry face;Set up;Sitting           Upper Body Dressing : Moderate assistance;Sitting   Lower Body Dressing: Total assistance;Bed level Lower Body Dressing Details (indicate cue type and reason): don socks Toilet Transfer: Minimal assistance;Stand-pivot;BSC;RW   Toileting- Clothing Manipulation and Hygiene: Total assistance;Sit to/from stand        Functional mobility during ADLs: Minimal assistance;Rolling walker General ADL Comments: Patient performed bed mobility, sit to stand, transfer to [facility], toileting, then ambulated around bed with RW to recliner to eat breakfast.     Vision     Perception     Praxis      Pertinent Vitals/Pain Pain Assessment: No/denies pain     Hand Dominance Right   Extremity/Trunk Assessment Upper Extremity Assessment Upper Extremity Assessment: Generalized weakness   Lower Extremity Assessment Lower Extremity Assessment: Defer to PT evaluation       Communication Communication Communication: No difficulties   Cognition Arousal/Alertness: Awake/alert Behavior During Therapy: WFL for tasks assessed/performed Overall Cognitive Status: Within Functional Limits for tasks assessed                     General Comments       Exercises       Shoulder Instructions      Home Living Family/patient expects to be discharged to:: Private residence Living Arrangements: Spouse/significant other Available Help at Discharge: Family;Available 24 hours/day Type of Home: House Home Access: Stairs to enter [employer] of Steps: 4 Entrance Stairs-Rails: Right;Left;Can reach both Home Layout: One level     Bathroom Shower/Tub: Occupational psychologist: Handicapped height     Home Equipment: Cane - single point;Grab bars - tub/shower;Hand held Tourist information centre manager - 2 wheels  Prior Functioning/Environment Level of Independence: Independent with assistive device(s)        Comments: Uses SPC for community mobility; recently using RW     OT Diagnosis: Generalized weakness   OT Problem List: Decreased strength;Decreased activity tolerance;Impaired balance (sitting and/or standing);Decreased knowledge of use of DME or AE;Cardiopulmonary status limiting activity;Increased edema   OT Treatment/Interventions: Self-care/ADL training;Therapeutic exercise;DME  and/or AE instruction;Therapeutic activities;Patient/family education;Balance training    OT Goals(Current goals can be found in the care plan section) Acute Rehab OT Goals Patient Stated Goal: To go home OT Goal Formulation: With patient Time For Goal Achievement: 06/24/16 Potential to Achieve Goals: Good ADL Goals Pt Will Perform Upper Body Bathing: with set-up;sitting Pt Will Perform Lower Body Bathing: with min assist;sit to/from stand Pt Will Perform Upper Body Dressing: with set-up;sitting Pt Will Perform Lower Body Dressing: with min assist;sit to/from stand Pt Will Transfer to Toilet: with supervision;ambulating;bedside commode Pt Will Perform Toileting - Clothing Manipulation and hygiene: sit to/from stand;with min assist  OT Frequency: Min 2X/week   Barriers to D/C:            Co-evaluation              End of Session Equipment Utilized During Treatment: Surveyor, mining Communication: Mobility status;Other (comment) (condom cath falling off)  Activity Tolerance: Patient tolerated treatment well Patient left: in chair;with call bell/phone within reach;with chair alarm set   Time: [phone] OT Time Calculation (min): 31 min Charges:  OT General Charges $OT Visit: 1 Procedure OT Evaluation $OT Eval Low Complexity: 1 Procedure G-Codes:    Vivika Poythress A 06/12/2016, 10:27 AM

## 2016-06-10 NOTE — Progress Notes (Signed)
CRITICAL VALUE ALERT  Critical value received:  Troponin 0.03  Date of notification:  06/10/2016  Time of notification:  0130  Critical value read back:cyes  Nurse who received alert:  Dorise Bullion  MD notified (1st page): Schorr  Time of first page:  0210  MD notified (2nd page):  Time of second page:   Responding MD:   Time MD responded:

## 2016-06-10 NOTE — Evaluation (Signed)
Physical Therapy Evaluation Patient Details Name: CLELAND GEHRIS MRN: [id number] DOB: Dec 05, 1940 Today's Date: 06/10/2016   History of Present Illness  75 y.o. male with medical history significant of NSTEMI 2016, a.fib w RVR, cirrhosis, depression polysubstance abuse, EtOH abuse,   hepatitis C, DVT, chronic diastolic CHF dilated cardiomyopathy Presented with Chest pain since last night radiating to left arm, ongoing diarrhea.  Clinical Impression  Pt admitted with above diagnosis. Pt currently with functional limitations due to the deficits listed below (see PT Problem List). Pt mobilizing with min A and RW and wants to return home. Spoke with wife who feels comfortable with caring for him at home.  Pt will benefit from skilled PT to increase their independence and safety with mobility to allow discharge to the venue listed below.       Follow Up Recommendations Home health PT;Supervision for mobility/OOB    Equipment Recommendations  None recommended by PT    Recommendations for Other Services       Precautions / Restrictions Precautions Precautions: Fall Restrictions Weight Bearing Restrictions: No      Mobility  Bed Mobility Overal bed mobility: Needs Assistance Bed Mobility: Supine to Sit     Supine to sit: Min assist;HOB elevated     General bed mobility comments: min A to LE's and to help manage covers  Transfers Overall transfer level: Needs assistance Equipment used: Rolling walker (2 wheeled);None Transfers: Sit to/from [employer] to Stand: Min assist;+2 safety/equipment Stand pivot transfers: Min assist;+2 safety/equipment       General transfer comment: Min A for power up and then to stabilize as pt pivoted to [facility]. Pt immediately assumes flexed trunk posture and reaches for stable support surfaces.   Ambulation/Gait Ambulation/Gait assistance: Min assist;+2 safety/equipment Ambulation Distance (Feet): 15 Feet Assistive device:  Rolling walker (2 wheeled) Gait Pattern/deviations: Wide base of support;Trunk flexed;Shuffle Gait velocity: decreased Gait velocity interpretation: <1.8 ft/sec, indicative of risk for recurrent falls General Gait Details: Pt with shuffling gait and wide base but only min A needed with use of RW. Pt fatigues quickly  Stairs            Wheelchair Mobility    Modified Rankin (Stroke Patients Only)       Balance Overall balance assessment: Needs assistance Sitting-balance support: Feet supported;Single extremity supported Sitting balance-Leahy Scale: Fair Sitting balance - Comments: able to maintain sitting EOB but cannot reach to feet   Standing balance support: Bilateral upper extremity supported Standing balance-Leahy Scale: Poor Standing balance comment: reliant on UE support                             Pertinent Vitals/Pain Pain Assessment: No/denies pain         O2 sats 98% on RA  Home Living Family/patient expects to be discharged to:: Private residence Living Arrangements: Spouse/significant other Available Help at Discharge: Family;Available 24 hours/day Type of Home: House Home Access: Stairs to enter Entrance Stairs-Rails: Right;Left;Can reach both Entrance Stairs-Number of Steps: 4 Home Layout: One level Home Equipment: Cane - single point;Grab bars - tub/shower;Hand held shower head;Walker - 2 wheels      Prior Function Level of Independence: Independent with assistive device(s)         Comments: Used SPC for mobility but recently has been using RW      Hand Dominance   Dominant Hand: Right    Extremity/Trunk Assessment   Upper Extremity Assessment:  Defer to OT evaluation           Lower Extremity Assessment: RLE deficits/detail;LLE deficits/detail RLE Deficits / Details: AROM limited by LE swelling, hip, knee, and ankle grossly 4/5 LLE Deficits / Details: AROM limited by edema, LLE weaker than right, hip flex 3+/5, knee  flex/ ext 3+/5, ankle 3+/5  Cervical / Trunk Assessment: Other exceptions  Communication   Communication: No difficulties  Cognition Arousal/Alertness: Awake/alert Behavior During Therapy: WFL for tasks assessed/performed Overall Cognitive Status: Within Functional Limits for tasks assessed                      General Comments General comments (skin integrity, edema, etc.): pt needed assist with clean up after toileting, reports that he does it at home but did not feel like he could do it independently here    Exercises        Assessment/Plan    PT Assessment Patient needs continued PT services  PT Diagnosis Difficulty walking;Generalized weakness   PT Problem List Decreased mobility;Decreased activity tolerance;Decreased strength;Decreased balance;Decreased knowledge of use of DME;Decreased range of motion  PT Treatment Interventions DME instruction;Gait training;Stair training;Functional mobility training;Therapeutic exercise;Therapeutic activities;Balance training;Patient/family education   PT Goals (Current goals can be found in the Care Plan section) Acute Rehab PT Goals Patient Stated Goal: To go home PT Goal Formulation: With patient Time For Goal Achievement: 06/24/16 Potential to Achieve Goals: Good    Frequency Min 3X/week   Barriers to discharge        Co-evaluation PT/OT/SLP Co-Evaluation/Treatment: Yes Reason for Co-Treatment: For patient/therapist safety PT goals addressed during session: Mobility/safety with mobility;Balance;Proper use of DME;Strengthening/ROM         End of Session Equipment Utilized During Treatment: Gait belt Activity Tolerance: Patient limited by fatigue Patient left: in chair;with call bell/phone within reach;with chair alarm set Nurse Communication: Mobility status         Time: [id number] PT Time Calculation (min) (ACUTE ONLY): 31 min   Charges:   PT Evaluation $PT Eval Moderate Complexity: 1 Procedure      PT G Codes:      Leighton Roach, PT  Acute Rehab Services  [place], [place] 06/10/2016, 1:30 PM

## 2016-06-11 ENCOUNTER — Inpatient Hospital Stay (HOSPITAL_COMMUNITY): Payer: Medicare Other

## 2016-06-11 LAB — CBC
HCT: 35.9 % — ABNORMAL LOW (ref 39.0–52.0)
Hemoglobin: 11.8 g/dL — ABNORMAL LOW (ref 13.0–17.0)
MCH: 33.6 pg (ref 26.0–34.0)
MCHC: 32.9 g/dL (ref 30.0–36.0)
MCV: 102.3 fL — AB (ref 78.0–100.0)
PLATELETS: 132 10*3/uL — AB (ref 150–400)
RBC: 3.51 MIL/uL — AB (ref 4.22–5.81)
RDW: 16.7 % — AB (ref 11.5–15.5)
WBC: 3.6 10*3/uL — ABNORMAL LOW (ref 4.0–10.5)

## 2016-06-11 LAB — MAGNESIUM: Magnesium: 1.5 mg/dL — ABNORMAL LOW (ref 1.7–2.4)

## 2016-06-11 LAB — BASIC METABOLIC PANEL
ANION GAP: 7 (ref 5–15)
CALCIUM: 7.7 mg/dL — AB (ref 8.9–10.3)
CO2: 28 mmol/L (ref 22–32)
CREATININE: 0.76 mg/dL (ref 0.61–1.24)
Chloride: 106 mmol/L (ref 101–111)
GFR calc Af Amer: >60 mL/min (ref >60)
GLUCOSE: 94 mg/dL (ref 65–99)
Potassium: 3.6 mmol/L (ref 3.5–5.1)
Sodium: 141 mmol/L (ref 135–145)

## 2016-06-11 LAB — URINE CULTURE

## 2016-06-11 MED ORDER — FUROSEMIDE 10 MG/ML IJ SOLN
80.0000 mg | Freq: Two times a day (BID) | INTRAMUSCULAR | Status: DC
  Administered 2016-06-11 – 2016-06-12 (×3): 80 mg via INTRAVENOUS
  Filled 2016-06-11 (×3): qty 8

## 2016-06-11 MED ORDER — POTASSIUM CHLORIDE CRYS ER 20 MEQ PO TBCR
40.0000 meq | EXTENDED_RELEASE_TABLET | Freq: Once | ORAL | Status: AC
  Administered 2016-06-11: 40 meq via ORAL
  Filled 2016-06-11: qty 2

## 2016-06-11 MED ORDER — TRAZODONE HCL 50 MG PO TABS
50.0000 mg | ORAL_TABLET | Freq: Every day | ORAL | Status: DC
  Administered 2016-06-11: 50 mg via ORAL
  Filled 2016-06-11: qty 1

## 2016-06-11 MED ORDER — IOPAMIDOL (ISOVUE-300) INJECTION 61%
15.0000 mL | INTRAVENOUS | Status: AC
  Administered 2016-06-11 (×2): 15 mL via ORAL

## 2016-06-11 MED ORDER — MAGNESIUM SULFATE 4 GM/100ML IV SOLN
4.0000 g | Freq: Once | INTRAVENOUS | Status: AC
  Administered 2016-06-11: 4 g via INTRAVENOUS
  Filled 2016-06-11: qty 100

## 2016-06-11 MED ORDER — IOPAMIDOL (ISOVUE-300) INJECTION 61%
INTRAVENOUS | Status: AC
  Filled 2016-06-11: qty 100

## 2016-06-11 NOTE — Progress Notes (Signed)
OT Cancellation Note  Patient Details Name: Nathan Frank MRN: [id number] DOB: [birth year]   Cancelled Treatment:    Reason Eval/Treat Not Completed: Patient declined, no reason specified;Patient at procedure or test/ unavailable. Pt refused to participate in therapy or to sit up in chair to eat lunch. Pt stated "No, Imma eat my lunch right here in bed and then I'm goin down to the CT machine." Will check later if time allows.  Redmond Baseman, OTR/L Pager: [phone] 06/11/2016, 11:57 AM

## 2016-06-11 NOTE — Progress Notes (Signed)
Subjective: No CP  Breathing is better  Still with diarrhea  Dark color   Objective: Vitals:   06/11/16 0732 06/11/16 0800 06/11/16 0802 06/11/16 0900  BP: 111/83 101/86 101/86 94/68  Pulse: (!) 111  (!) 126 93  Resp: 13 20  16   Temp: 99 F (37.2 C)     TempSrc: Oral     SpO2: 96%     Weight:      Height:       Weight change: 3 lb (1.361 kg)  Intake/Output Summary (Last 24 hours) at 06/11/16 0933 Last data filed at 06/11/16 0827  Gross per 24 hour  Intake             1320 ml  Output             2350 ml  Net            -1030 ml   Net neg 775 cc  General: Alert, awake, oriented x3, in no acute distress Neck:  JVP is increased   Heart: irregular rate and rhythm, without murmurs, rubs, gallops.  Lungs: Clear to auscultation.  No rales or wheezes. ABd  Mild diffuse tenderness  Exemities:  1+ edema.   Neuro: Grossly intact, nonfocal.  Tele:  Afib  Lab Results: Results for orders placed or performed during the hospital encounter of 06/09/16 (from the past 24 hour(s))  Troponin I     Status: Abnormal   Collection Time: 06/10/16 10:44 AM  Result Value Ref Range   Troponin I 0.03 (HH) <0.03 ng/mL  Basic metabolic panel     Status: Abnormal   Collection Time: 06/11/16  2:35 AM  Result Value Ref Range   Sodium 141 135 - 145 mmol/L   Potassium 3.6 3.5 - 5.1 mmol/L   Chloride 106 101 - 111 mmol/L   CO2 28 22 - 32 mmol/L   Glucose, Bld 94 65 - 99 mg/dL   BUN <5 (L) 6 - 20 mg/dL   Creatinine, Ser 0.76 0.61 - 1.24 mg/dL   Calcium 7.7 (L) 8.9 - 10.3 mg/dL   GFR calc non Af Amer >60 >60 mL/min   GFR calc Af Amer >60 >60 mL/min   Anion gap 7 5 - 15  Magnesium     Status: Abnormal   Collection Time: 06/11/16  2:35 AM  Result Value Ref Range   Magnesium 1.5 (L) 1.7 - 2.4 mg/dL  CBC     Status: Abnormal   Collection Time: 06/11/16  2:35 AM  Result Value Ref Range   WBC 3.6 (L) 4.0 - 10.5 K/uL   RBC 3.51 (L) 4.22 - 5.81 MIL/uL   Hemoglobin 11.8 (L) 13.0 - 17.0 g/dL   HCT  35.9 (L) 39.0 - 52.0 %   MCV 102.3 (H) 78.0 - 100.0 fL   MCH 33.6 26.0 - 34.0 pg   MCHC 32.9 30.0 - 36.0 g/dL   RDW 16.7 (H) 11.5 - 15.5 %   Platelets 132 (L) 150 - 400 K/uL    Studies/Results: No results found.  Medications:REviewed   @PROBHOSP @  1  CP  Resolved  2.  PAF  On B Blocker   Rates better  Had been on amio in past  With thyroid issues (noncompliance) I would not resume      3  Acute on chronic diastoliic CHF  Volume status is up  Will increase lasix  LVEF on echo 55 to 60%  4  GI  Continues to have diarrhea  5  Hypothyroid  Back on synthroid    LOS: 2 days   Nathan Frank 06/11/2016, 9:33 AM

## 2016-06-11 NOTE — Care Management Note (Signed)
Case Management Note  Patient Details  Name: Nathan Frank MRN: [id number] Date of Birth: 11-23-1940  Subjective/Objective:   Pt presented for chest pain. Pt was previously d/c home with wife and [place] via [facility] for [facility] PT/OT Services. [facility] Liaison Santiago Glad is aware that pt is hospitalized.                  Action/Plan: CM did speak with pt in regards to a [facility] for services and pt is refusing at this time. States his wife is home and she can do everything a Therapist, sports can do. Pt will need resumption orders once stable for d/c. No further needs from CM at this time.   Expected Discharge Date:                  Expected Discharge Plan:  [place]  In-House Referral:  NA  Discharge planning Services  CM Consult  Post Acute Care Choice:  Home Health, Resumption of Svcs/PTA Provider Choice offered to:  Patient, Spouse  DME Arranged:  N/A DME Agency:  NA  HH Arranged:  PT, OT HH Agency:  [facility]  Status of Service:  Completed, signed off  If discussed at [place] of Stay Meetings, dates discussed:    Additional Comments:  Bethena Roys, RN 06/11/2016, 11:49 AM

## 2016-06-11 NOTE — Progress Notes (Signed)
PROGRESS NOTE  Nathan Frank [id number] DOB: Dec 20, 1940 DOA: 06/09/2016 PCP: Thressa Sheller, MD  HPI/Recap of past 24 hours:  Heart rate improving, remain in afib, bp low normal, patient c/o not able to sleep at night, c/o intermittent abdominal pain, and persistent diarrhea in the last few weeks, report chills no fever, no n/v.   Assessment/Plan: Active Problems:   Essential hypertension   Atrial fibrillation with RVR (HCC)   Chest pain   Chronic atrial fibrillation (HCC)   Hypokalemia   Alcoholic cirrhosis of liver with ascites (HCC)   Hypomagnesemia   Acute on chronic diastolic CHF (congestive heart failure) (HCC)    Chest pain: h/o NSTEMI currently chest pain free, troponin mild and flat, ekg afib/chronic RBBB, QTC prolongation, no acute changes, on asa/betablocker, ldl 36, not a candidate for statin with chronic liver issues. Cardiology following  Afib/rvr: briefly on cardizem drip initially on presentation, now on betablocker, rate controlled, CHADSvasc score 3 with age 38, h/o chf, he does has h/o gi bleed and h/o alcohol and substance abuse, has not been on anticoagulation, may not be a good candidate for amiodarone due to chronic liver disease, meds adjustment per cardiology  Acute on chronic diastolic chf exacerbation: edematous, on lasix/spironolactone, also has low albumin  Hypok/hypomag: replace k/mag  Chronic diarrhea:  recent stool study negative for cdiff and negative GI prc panel, trial of pancreatic enzyme supplement for now, continue outpatient GI follow up Due to c/o abdominal pain, will get CT Ab, repeat stool studies  Hepc/cirrhosis/?hcc: need outpatient GI/ID followup  Hypothyroidism: continue synthroid, tsh significantly elevated, need education for meds adherence  H/o DVT: not on anticoagulation due to noncompliance and gi bleed, patient denies h/o DVT  Medication noncompliance/alcohol and polysubstance abuse per chart review h/o heroin use as  well  Deconditioning: patient report no driving, does not walk, he stay in bed and watch TV all day for the last 1-2 months, he report significant dyspnea when he walks a few steps, pt recommended home health  Code Status: full  Family Communication: patient   Disposition Plan: remain in the hospital a few days, eventually home with home health   Consultants:  cardiology  Procedures:  none  Antibiotics:  none   Objective: BP 111/83 (BP Location: Left Arm)   Pulse (!) 111   Temp 99 F (37.2 C) (Oral)   Resp 13   Ht 6' (1.829 m)   Wt 107.5 kg (237 lb)   SpO2 96%   BMI 32.14 kg/m   Intake/Output Summary (Last 24 hours) at 06/11/16 0736 Last data filed at 06/11/16 0440  Gross per 24 hour  Intake          1094.25 ml  Output             2350 ml  Net         -1255.75 ml   Filed Weights   06/09/16 2237 06/10/16 0536 06/11/16 0440  Weight: 106.1 kg (234 lb) 106.8 kg (235 lb 8 oz) 107.5 kg (237 lb)    Exam:   General:  Weak but NAD  Cardiovascular: IRRR  Respiratory: crackles at baseline  Abdomen: Soft/ND/NT, positive BS  Musculoskeletal: diffuse pitting  Edema  Neuro: aaox3  Data Reviewed: Basic Metabolic Panel:  Recent Labs Lab 06/09/16 1500 06/09/16 1521 06/10/16 0758 06/11/16 0235  NA 140 143 139 141  K 3.3* 3.3* 3.1* 3.6  CL 107 102 107 106  CO2 26  --  26 28  GLUCOSE  93 88 90 94  BUN <5* <3* <5* <5*  CREATININE 0.87 0.70 0.84 0.76  CALCIUM 7.9*  --  7.4* 7.7*  MG 1.3*  --  1.6* 1.5*  PHOS  --   --  3.8  --    Liver Function Tests:  Recent Labs Lab 06/09/16 1500 06/10/16 0758  AST 69* 55*  ALT 26 21  ALKPHOS 67 62  BILITOT 1.7* 1.5*  PROT 5.9* 4.9*  ALBUMIN 2.1* 1.7*    Recent Labs Lab 06/09/16 1500  LIPASE 23   No results for input(s): AMMONIA in the last 168 hours. CBC:  Recent Labs Lab 06/09/16 1500 06/09/16 1521 06/10/16 0758 06/11/16 0235  WBC 4.0  --  3.8* 3.6*  NEUTROABS 2.5  --   --   --   HGB 12.2*  13.6 10.6* 11.8*  HCT 36.8* 40.0 31.8* 35.9*  MCV 101.9*  --  101.3* 102.3*  PLT 144*  --  124* 132*   Cardiac Enzymes:    Recent Labs Lab 06/09/16 2253 06/10/16 0758 06/10/16 1044  TROPONINI 0.03* 0.03* 0.03*   BNP (last 3 results)  Recent Labs  04/18/16 1855 04/29/16 0305 06/09/16 1500  BNP 375.8* 413.5* 463.2*    ProBNP (last 3 results) No results for input(s): PROBNP in the last 8760 hours.  CBG: No results for input(s): GLUCAP in the last 168 hours.  Recent Results (from the past 240 hour(s))  MRSA PCR Screening     Status: None   Collection Time: 06/09/16 10:30 PM  Result Value Ref Range Status   MRSA by PCR NEGATIVE NEGATIVE Final    Comment:        The GeneXpert MRSA Assay (FDA approved for NASAL specimens only), is one component of a comprehensive MRSA colonization surveillance program. It is not intended to diagnose MRSA infection nor to guide or monitor treatment for MRSA infections.      Studies: No results found.  Scheduled Meds: . aspirin  324 mg Oral Once  . chlordiazePOXIDE  10 mg Oral Once  . enoxaparin (LOVENOX) injection  40 mg Subcutaneous Daily  . folic acid  1 mg Oral Daily  . furosemide  40 mg Intravenous Q12H  . guaiFENesin  600 mg Oral BID  . levothyroxine  50 mcg Oral QAC breakfast  . lipase/protease/amylase  12,000 Units Oral TID WC  . magnesium sulfate 1 - 4 g bolus IVPB  4 g Intravenous Once  . metoprolol tartrate  12.5 mg Oral BID  . potassium chloride  40 mEq Oral Once  . saccharomyces boulardii  250 mg Oral BID  . sodium chloride flush  3 mL Intravenous Q12H  . spironolactone  50 mg Oral Daily  . sucralfate  1 g Oral TID WC & HS  . thiamine  100 mg Oral Daily    Continuous Infusions:    Time spent: 63mins  Khyrin Trevathan MD, PhD  [facility] Pager [phone]. If 7PM-7AM, please contact night-coverage at [email or web address], password [facility] 06/11/2016, 7:36 AM  LOS: 2 days

## 2016-06-11 NOTE — Progress Notes (Signed)
Patient refusing to drink contrast solution because he doesn't want to skip lunch. CT made aware and will reschedule the scan to happen at 1430. First contrast solution will be administered at 1230. Lenna Sciara, RN 11:17 AM

## 2016-06-11 NOTE — Consult Note (Signed)
   [facility] CM Inpatient Consult   06/11/2016  Nathan Frank [birth year] [id number]  Referral for community care management follow up post hospital monitoring and follow up. Patient is a 75 y.o. male with medical history significant of NSTEMI 2016, a.fib w RVR, cirrhosis, depression polysubstance abuse, EtOH abuse, hepatitis C, DVT, chronic diastolic HF dilated cardiomyopathy per MD notes of H&P.  Met with the patient regarding [facility] Management benefits with his [facility].  Patient has had 4 admissions in the past 2 months. Patient states, "I have no need for a case manager or a nurse to check on me."  Explained that this is a covered benefit with his insurance and his primary care provider, Thressa Sheller, MD.  Patient continues to declines services.  He did accept a brochure with contact information.  Encouraged him to call for any needs for transportation, medications, etc.  Patient continues to decline services.  For questions, please contact:  Natividad Brood, RN BSN [facility] Liaison  [phone] business mobile phone Toll free office [phone]

## 2016-06-11 NOTE — Progress Notes (Addendum)
Pt had 3 bowel movements yesterday and pt reported he had 4 bowel movements last night. Stool is the same consistency described as a Type 6 using the [facility] Stool Chart.  Pt stated he has had 6-7 bowel movents per day since July. Md aware. Will cont to monitor pt.

## 2016-06-12 LAB — GASTROINTESTINAL PANEL BY PCR, STOOL (REPLACES STOOL CULTURE)
ASTROVIRUS: NOT DETECTED
Adenovirus F40/41: NOT DETECTED
Campylobacter species: NOT DETECTED
Cryptosporidium: NOT DETECTED
Cyclospora cayetanensis: NOT DETECTED
ENTAMOEBA HISTOLYTICA: NOT DETECTED
ENTEROAGGREGATIVE E COLI (EAEC): NOT DETECTED
ENTEROTOXIGENIC E COLI (ETEC): NOT DETECTED
Enteropathogenic E coli (EPEC): NOT DETECTED
Giardia lamblia: NOT DETECTED
NOROVIRUS GI/GII: NOT DETECTED
Plesimonas shigelloides: NOT DETECTED
ROTAVIRUS A: NOT DETECTED
SALMONELLA SPECIES: NOT DETECTED
SAPOVIRUS (I, II, IV, AND V): NOT DETECTED
SHIGA LIKE TOXIN PRODUCING E COLI (STEC): NOT DETECTED
SHIGELLA/ENTEROINVASIVE E COLI (EIEC): NOT DETECTED
VIBRIO CHOLERAE: NOT DETECTED
Vibrio species: NOT DETECTED
Yersinia enterocolitica: NOT DETECTED

## 2016-06-12 LAB — COMPREHENSIVE METABOLIC PANEL
ALBUMIN: 1.9 g/dL — AB (ref 3.5–5.0)
ALK PHOS: 70 U/L (ref 38–126)
ALT: 19 U/L (ref 17–63)
ANION GAP: 7 (ref 5–15)
AST: 59 U/L — ABNORMAL HIGH (ref 15–41)
CHLORIDE: 103 mmol/L (ref 101–111)
CO2: 27 mmol/L (ref 22–32)
CREATININE: 0.79 mg/dL (ref 0.61–1.24)
Calcium: 7.8 mg/dL — ABNORMAL LOW (ref 8.9–10.3)
GFR calc non Af Amer: >60 mL/min (ref >60)
GLUCOSE: 86 mg/dL (ref 65–99)
Potassium: 3.1 mmol/L — ABNORMAL LOW (ref 3.5–5.1)
SODIUM: 137 mmol/L (ref 135–145)
Total Bilirubin: 1.3 mg/dL — ABNORMAL HIGH (ref 0.3–1.2)
Total Protein: 5.6 g/dL — ABNORMAL LOW (ref 6.5–8.1)

## 2016-06-12 LAB — MAGNESIUM: Magnesium: 1.6 mg/dL — ABNORMAL LOW (ref 1.7–2.4)

## 2016-06-12 MED ORDER — TRAZODONE HCL 100 MG PO TABS
100.0000 mg | ORAL_TABLET | Freq: Every day | ORAL | Status: AC
  Administered 2016-06-12 – 2016-06-13 (×2): 100 mg via ORAL
  Filled 2016-06-12 (×2): qty 1

## 2016-06-12 MED ORDER — MAGNESIUM SULFATE 4 GM/100ML IV SOLN
4.0000 g | Freq: Once | INTRAVENOUS | Status: AC
  Administered 2016-06-12: 4 g via INTRAVENOUS
  Filled 2016-06-12: qty 100

## 2016-06-12 MED ORDER — POTASSIUM CHLORIDE CRYS ER 20 MEQ PO TBCR: 40.0000 meq | EXTENDED_RELEASE_TABLET | Freq: Every day | ORAL | Status: DC

## 2016-06-12 MED ORDER — POTASSIUM CHLORIDE CRYS ER 20 MEQ PO TBCR
40.0000 meq | EXTENDED_RELEASE_TABLET | Freq: Once | ORAL | Status: AC
  Administered 2016-06-12: 40 meq via ORAL
  Filled 2016-06-12: qty 2

## 2016-06-12 NOTE — Progress Notes (Signed)
Subjective: No CP  Breathing is OK   Objective: Vitals:   06/11/16 2219 06/12/16 0055 06/12/16 0611 06/12/16 0849  BP: (!) 113/92 94/64 110/73 108/81  Pulse: 98 94  (!) 113  Resp: 15 16 14 19   Temp:  99.3 F (37.4 C) 98.7 F (37.1 C) 98.5 F (36.9 C)  TempSrc:  Oral Oral Oral  SpO2: 97% 95% 94% 97%  Weight:      Height:       Weight change:   Intake/Output Summary (Last 24 hours) at 06/12/16 1144 Last data filed at 06/12/16 0850  Gross per 24 hour  Intake              920 ml  Output             2850 ml  Net            -1930 ml   Net neg 2.7 L  General: Alert, awake, oriented x3, in no acute distress Neck:  Neck is full   Heart: Irregular rate and rhythm, without murmurs, rubs, gallops.  Lungs: Clear to auscultation.  No rales or wheezes. Exemities:  1+ UE and LE   edema.   Neuro: Grossly intact, nonfocal.  Tele  Afib  90s    Lab Results: Results for orders placed or performed during the hospital encounter of 06/09/16 (from the past 24 hour(s))  Comprehensive metabolic panel     Status: Abnormal   Collection Time: 06/12/16  6:59 AM  Result Value Ref Range   Sodium 137 135 - 145 mmol/L   Potassium 3.1 (L) 3.5 - 5.1 mmol/L   Chloride 103 101 - 111 mmol/L   CO2 27 22 - 32 mmol/L   Glucose, Bld 86 65 - 99 mg/dL   BUN <5 (L) 6 - 20 mg/dL   Creatinine, Ser 0.79 0.61 - 1.24 mg/dL   Calcium 7.8 (L) 8.9 - 10.3 mg/dL   Total Protein 5.6 (L) 6.5 - 8.1 g/dL   Albumin 1.9 (L) 3.5 - 5.0 g/dL   AST 59 (H) 15 - 41 U/L   ALT 19 17 - 63 U/L   Alkaline Phosphatase 70 38 - 126 U/L   Total Bilirubin 1.3 (H) 0.3 - 1.2 mg/dL   GFR calc non Af Amer >60 >60 mL/min   GFR calc Af Amer >60 >60 mL/min   Anion gap 7 5 - 15  Magnesium     Status: Abnormal   Collection Time: 06/12/16  6:59 AM  Result Value Ref Range   Magnesium 1.6 (L) 1.7 - 2.4 mg/dL    Studies/Results: Ct Abdomen Pelvis W Contrast  Result Date: 06/11/2016 CLINICAL DATA:  Pt c/o persistent diarrhea,  peri-umbilical pain; pt has had diarrhea x 2 weeks, without any improvement EXAM: CT ABDOMEN AND PELVIS WITH CONTRAST TECHNIQUE: Multidetector CT imaging of the abdomen and pelvis was performed using the standard protocol following bolus administration of intravenous contrast. CONTRAST:  100 mL of Isovue-300 intravenous contrast COMPARISON:  05/28/2016 FINDINGS: Lower chest: Small, left greater than right, pleural effusions associated with dependent lower lobe atelectasis. No evidence of pneumonia or edema. Heart normal in size. Hepatobiliary: Heterogeneous liver attenuation and enhancement. Liver is decreased in overall attenuation consistent with fatty infiltration. There is surface nodularity with some central volume loss consistent with cirrhosis. No discrete liver mass. Multiple gallstones. Stable prominence of the common bile duct. No duct stones. Pancreas: Unremarkable. Spleen: Normal. Adrenals/Urinary Tract: No adrenal masses. Bilateral renal cortical thinning. Small nonobstructing  stones in the lower pole of the left kidney. No renal masses. No hydronephrosis. Normal ureters. Normal bladder. Stomach/Bowel: Stomach and small bowel are unremarkable. Colon is mostly decompressed but also unremarkable. Normal appendix visualized. Vascular/Lymphatic: There are varices in the upper abdomen with dilation of coronary vein and paraesophageal varices. There are several prominent to mildly enlarged gastrohepatic ligament lymph nodes. There is anterior gastrohepatic ligament lymph node measuring 11 mm in short axis, slightly larger than on the prior CT. No other adenopathy. Reproductive: Unremarkable. Musculoskeletal: Degenerative changes noted throughout the lumbar spine. Bones are demineralized. No osteoblastic or osteolytic lesions. Other: Small amount of ascites noted adjacent to the liver and in the posterior pelvic recess. There is subcutaneous edema along the flanks. IMPRESSION: 1. No definite acute findings in  the abdomen or pelvis. No inflammation or other bowel abnormality to indicate a cause for the patient's diarrhea. 2. There are changes of cirrhosis as well as hepatic steatosis. Consider steatohepatitis if there are consistent clinical symptoms. 3. Small, left greater than right, pleural effusions. Small amount of ascites. 4. Mild, presumed reactive, adenopathy along the gastrohepatic ligament. 5. 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 6. Cholelithiasis. Electronically Signed   By: Lajean Manes M.D.   On: 06/11/2016 15:49    Medications:REviewed     @PROBHOSP @  1  Atrial fib  Remains in afib Not on anticoagulatoni prior to admit (hx of EtOH abuse, ulcers, noncompliance)  Rates are better than admit  Thyroid correction.    2  Acute on chronic diastolic CHF  Normal coronary artieries in July 2016  Continues to diurese  REnal function is good  Add daily KCL  3  GI  Continues with diarrhea  IM following    4  Thyroid  Continue synthroid  Keep off of amio    LOS: 3 days   [name] 06/12/2016, 11:44 AM

## 2016-06-12 NOTE — Progress Notes (Signed)
Occupational Therapy Treatment Patient Details Name: Nathan Frank MRN: [id number] DOB: 04-Aug-1941 Today's Date: 06/12/2016    History of present illness 75 y.o. male with medical history significant of NSTEMI 2016, a.fib w RVR, cirrhosis, depression polysubstance abuse, EtOH abuse,   hepatitis C, DVT, chronic diastolic CHF dilated cardiomyopathy Presented with Chest pain since last night radiating to left arm, ongoing diarrhea.   OT comments  Pt ambulated around room with HR increased to 133. Pt stated that was "enough walking". Discussed compensatory techniques and use of AE/DME for ADL, which pt agreed to trying. Will bring AE next visit. Recommend follow up with HHOT and [place] nursing to increase independence with self care and help establish consistent schedule for medication management   Follow Up Recommendations  Home health OT;Supervision/Assistance - 24 hour    Equipment Recommendations  3 in 1 bedside comode    Recommendations for Other Services      Precautions / Restrictions Precautions Precautions: Fall Restrictions Weight Bearing Restrictions: No       Mobility Bed Mobility Overal bed mobility: Modified Independent                Transfers Overall transfer level: Needs assistance Equipment used: Rolling walker (2 wheeled) Transfers: Sit to/from Stand Sit to Stand: Min guard         General transfer comment: poorly controlled descent. Pt states "that just what I do"    Balance     Sitting balance-Leahy Scale: Good       Standing balance-Leahy Scale: Fair                     ADL                                       Functional mobility during ADLs: Min guard;Rolling walker;Cueing for safety General ADL Comments: discussed ADL and pt states that LB ADL are difficult for him to complete due to his stomach and difficulty reachinghis feet. Discussed difficlty with jeans/pants. Recommended pt wear elastic waist pants and pt  thought that was a great idea and would solve his difficulty with manipulating buttons/zippers. also discussed avilability of AD to help with LB ADL, Pt most likely would benefit from long handled sponge, sock aid and reacher. Pt would also benefit from using a 3in1 for a shower chair.                                       Cognition   Behavior During Therapy: WFL for tasks assessed/performed Overall Cognitive Status: Within Functional Limits for tasks assessed                       Extremity/Trunk Assessment   generalized BUE weakness. Grip strength good            Exercises     Shoulder Instructions       General Comments  Pt appears agitated at times but enjoyed talking about his grandchildren and became cooperative with therapy. Pt stated he would like to know about more things that would help make things easier for him.    Pertinent Vitals/ Pain       Pain Assessment: No/denies pain  Home Living  Prior Functioning/Environment              Frequency Min 2X/week     Progress Toward Goals  OT Goals(current goals can now be found in the care plan section)  Progress towards OT goals: Progressing toward goals  Acute Rehab OT Goals Patient Stated Goal: To go home OT Goal Formulation: With patient Time For Goal Achievement: 06/24/16 Potential to Achieve Goals: Good ADL Goals Pt Will Perform Grooming: with supervision;standing Pt Will Perform Upper Body Bathing: with set-up;sitting Pt Will Perform Lower Body Bathing: with min assist;sit to/from stand Pt Will Perform Upper Body Dressing: with set-up;sitting Pt Will Perform Lower Body Dressing: with min assist;sit to/from stand Pt Will Transfer to Toilet: with supervision;ambulating;bedside commode Pt Will Perform Toileting - Clothing Manipulation and hygiene: sit to/from stand;with min assist  Plan Discharge plan remains appropriate     Co-evaluation                 End of Session Equipment Utilized During Treatment: Gait belt;Rolling walker   Activity Tolerance Patient tolerated treatment well   Patient Left in chair;with call bell/phone within reach;with chair alarm set   Nurse Communication Mobility status        Time: [id number] OT Time Calculation (min): 23 min  Charges: OT General Charges $OT Visit: 1 Procedure OT Treatments $Self Care/Home Management : 23-37 mins  Demari Kropp,HILLARY 06/12/2016, 2:51 PM   [facility], OTR/L  [phone] 06/12/2016

## 2016-06-12 NOTE — Plan of Care (Signed)
Problem: Education: Goal: Knowledge of [facility] Education information/materials will improve Outcome: Progressing Patient aware of plan of care.  RN provided medication education to patient on all medications administered thus far this shift.  Patient stated understanding.  Patient complained of pain this shift, PRN medication administered and was effective (see MAR and flowsheets for detailed documentation).

## 2016-06-12 NOTE — Progress Notes (Signed)
PT Cancellation Note  Patient Details Name: Nathan Frank MRN: [id number] DOB: 1941-07-11   Cancelled Treatment:    Reason Eval/Treat Not Completed: Patient declined, no reason specified (x 2).  Will try again Monday.   Ramond Dial 06/12/2016, 2:05 PM  Mee Hives, PT MS Acute Rehab Dept. Number: [place] and [place]

## 2016-06-12 NOTE — Plan of Care (Signed)
Problem: Safety: Goal: Ability to remain free from injury will improve Outcome: Progressing RN instructed patient to call and wait for staff assistance prior to getting out of bed.  Patient stated understanding.  RN and Nurse Tech providing safe environment throughout this shift.

## 2016-06-12 NOTE — Care Management Important Message (Signed)
Important Message  Patient Details  Name: Nathan Frank MRN: [id number] Date of Birth: 1941-04-26   Medicare Important Message Given:  Yes    Bethena Roys, RN 06/12/2016, 2:14 PM

## 2016-06-12 NOTE — Care Management Note (Signed)
Case Management Note  Patient Details  Name: Nathan Frank MRN: [id number] Date of Birth: [birth year]  Subjective/Objective:                    Action/Plan:   Expected Discharge Date:                  Expected Discharge Plan:  [place]  In-House Referral:  NA  Discharge planning Services  CM Consult  Post Acute Care Choice:  Home Health, Resumption of Svcs/PTA Provider Choice offered to:  Patient, Spouse  DME Arranged:  N/A DME Agency:  NA  HH Arranged:  PT, OT HH Agency:  [place]  Status of Service:  Completed, signed off  If discussed at [place] of Stay Meetings, dates discussed:    Additional Comments:  Stevi Hollinshead 06/12/2016, 1:47 PM

## 2016-06-12 NOTE — Progress Notes (Signed)
PROGRESS NOTE  Nathan Frank [id number] DOB: May 29, 1941 DOA: 06/09/2016 PCP: Thressa Sheller, MD  HPI/Recap of past 24 hours:  C/o not able to sleep, ask for 100mg  trazadone, repeort he has been taking it for years,  Reported diarrhea x5 times, per documentation, bm x2 last 24hrs C/o bilateral arm and legs being swollen Heart rate improving, remain in afib, bp low normal,    Assessment/Plan: Active Problems:   Essential hypertension   Atrial fibrillation with RVR (HCC)   Chest pain   Chronic atrial fibrillation (HCC)   Hypokalemia   Alcoholic cirrhosis of liver with ascites (HCC)   Hypomagnesemia   Acute on chronic diastolic CHF (congestive heart failure) (HCC)    Chest pain: h/o NSTEMI currently chest pain free, troponin mild and flat, ekg afib/chronic RBBB, QTC prolongation, no acute changes, on asa/betablocker, ldl 36, not a candidate for statin with chronic liver issues. Cardiology following  Afib/rvr: briefly on cardizem drip initially on presentation, now on betablocker, rate controlled, CHADSvasc score 3 with age 75, h/o chf, he does has h/o gi bleed and h/o alcohol and substance abuse, has not been on anticoagulation, may not be a good candidate for amiodarone due to chronic liver disease, meds adjustment per cardiology  Acute on chronic diastolic chf exacerbation: edematous, on lasix/spironolactone, also has low albumin  Bilateral arm and leg edema, likely from volume overload, patient does has h/o dvt documented, though patient denies it, will get venous US for further eval.  Hypok/hypomag: replace k/mag  Chronic diarrhea:  recent stool study negative for cdiff and negative GI prc panel, started pancreatic enzyme supplement  continue outpatient GI follow up Due to c/o abdominal pain, CT Ab /pel no acute findings, does has cholilithiasis, lft wnl, no n/v.  nrepeat stool studies pending  Hepc/cirrhosis/?hcc: need outpatient GI/ID followup  Hypothyroidism:  continue synthroid, tsh significantly elevated, need education for meds adherence  H/o DVT: not on anticoagulation due to noncompliance and gi bleed, patient denies h/o DVT  Medication noncompliance/alcohol and polysubstance abuse per chart review h/o heroin use as well  Deconditioning: patient report no driving, does not walk, he stay in bed and watch TV all day for the last 1-2 months, he report significant dyspnea when he walks a few steps, pt recommended home health  Code Status: full  Family Communication: patient   Disposition Plan: remain in the hospital a few days, eventually home with home health   Consultants:  cardiology  Procedures:  none  Antibiotics:  none   Objective: BP 110/73 (BP Location: Left Arm)   Pulse 94   Temp 98.7 F (37.1 C) (Oral)   Resp 14   Ht 6' (1.829 m)   Wt 107.5 kg (237 lb)   SpO2 94%   BMI 32.14 kg/m   Intake/Output Summary (Last 24 hours) at 06/12/16 0806 Last data filed at 06/12/16 0606  Gross per 24 hour  Intake              920 ml  Output             2850 ml  Net            -1930 ml   Filed Weights   06/09/16 2237 06/10/16 0536 06/11/16 0440  Weight: 106.1 kg (234 lb) 106.8 kg (235 lb 8 oz) 107.5 kg (237 lb)    Exam:   General:  Weak but NAD  Cardiovascular: IRRR  Respiratory: crackles at baseline  Abdomen: Soft/ND/NT, positive BS  Musculoskeletal: diffuse  pitting  Edema  Neuro: aaox3  Data Reviewed: Basic Metabolic Panel:  Recent Labs Lab 06/09/16 1500 06/09/16 1521 06/10/16 0758 06/11/16 0235  NA 140 143 139 141  K 3.3* 3.3* 3.1* 3.6  CL 107 102 107 106  CO2 26  --  26 28  GLUCOSE 93 88 90 94  BUN <5* <3* <5* <5*  CREATININE 0.87 0.70 0.84 0.76  CALCIUM 7.9*  --  7.4* 7.7*  MG 1.3*  --  1.6* 1.5*  PHOS  --   --  3.8  --    Liver Function Tests:  Recent Labs Lab 06/09/16 1500 06/10/16 0758  AST 69* 55*  ALT 26 21  ALKPHOS 67 62  BILITOT 1.7* 1.5*  PROT 5.9* 4.9*  ALBUMIN 2.1* 1.7*     Recent Labs Lab 06/09/16 1500  LIPASE 23   No results for input(s): AMMONIA in the last 168 hours. CBC:  Recent Labs Lab 06/09/16 1500 06/09/16 1521 06/10/16 0758 06/11/16 0235  WBC 4.0  --  3.8* 3.6*  NEUTROABS 2.5  --   --   --   HGB 12.2* 13.6 10.6* 11.8*  HCT 36.8* 40.0 31.8* 35.9*  MCV 101.9*  --  101.3* 102.3*  PLT 144*  --  124* 132*   Cardiac Enzymes:    Recent Labs Lab 06/09/16 2253 06/10/16 0758 06/10/16 1044  TROPONINI 0.03* 0.03* 0.03*   BNP (last 3 results)  Recent Labs  04/18/16 1855 04/29/16 0305 06/09/16 1500  BNP 375.8* 413.5* 463.2*    ProBNP (last 3 results) No results for input(s): PROBNP in the last 8760 hours.  CBG: No results for input(s): GLUCAP in the last 168 hours.  Recent Results (from the past 240 hour(s))  Urine culture     Status: Abnormal   Collection Time: 06/09/16  8:58 PM  Result Value Ref Range Status   Specimen Description URINE, CLEAN CATCH  Final   Special Requests NONE  Final   Culture <10,000 COLONIES/mL INSIGNIFICANT GROWTH (A)  Final   Report Status 06/11/2016 FINAL  Final  MRSA PCR Screening     Status: None   Collection Time: 06/09/16 10:30 PM  Result Value Ref Range Status   MRSA by PCR NEGATIVE NEGATIVE Final    Comment:        The GeneXpert MRSA Assay (FDA approved for NASAL specimens only), is one component of a comprehensive MRSA colonization surveillance program. It is not intended to diagnose MRSA infection nor to guide or monitor treatment for MRSA infections.      Studies: Ct Abdomen Pelvis W Contrast  Result Date: 06/11/2016 CLINICAL DATA:  Pt c/o persistent diarrhea, peri-umbilical pain; pt has had diarrhea x 2 weeks, without any improvement EXAM: CT ABDOMEN AND PELVIS WITH CONTRAST TECHNIQUE: Multidetector CT imaging of the abdomen and pelvis was performed using the standard protocol following bolus administration of intravenous contrast. CONTRAST:  100 mL of Isovue-300 intravenous  contrast COMPARISON:  05/28/2016 FINDINGS: Lower chest: Small, left greater than right, pleural effusions associated with dependent lower lobe atelectasis. No evidence of pneumonia or edema. Heart normal in size. Hepatobiliary: Heterogeneous liver attenuation and enhancement. Liver is decreased in overall attenuation consistent with fatty infiltration. There is surface nodularity with some central volume loss consistent with cirrhosis. No discrete liver mass. Multiple gallstones. Stable prominence of the common bile duct. No duct stones. Pancreas: Unremarkable. Spleen: Normal. Adrenals/Urinary Tract: No adrenal masses. Bilateral renal cortical thinning. Small nonobstructing stones in the lower pole of the left  kidney. No renal masses. No hydronephrosis. Normal ureters. Normal bladder. Stomach/Bowel: Stomach and small bowel are unremarkable. Colon is mostly decompressed but also unremarkable. Normal appendix visualized. Vascular/Lymphatic: There are varices in the upper abdomen with dilation of coronary vein and paraesophageal varices. There are several prominent to mildly enlarged gastrohepatic ligament lymph nodes. There is anterior gastrohepatic ligament lymph node measuring 11 mm in short axis, slightly larger than on the prior CT. No other adenopathy. Reproductive: Unremarkable. Musculoskeletal: Degenerative changes noted throughout the lumbar spine. Bones are demineralized. No osteoblastic or osteolytic lesions. Other: Small amount of ascites noted adjacent to the liver and in the posterior pelvic recess. There is subcutaneous edema along the flanks. IMPRESSION: 1. No definite acute findings in the abdomen or pelvis. No inflammation or other bowel abnormality to indicate a cause for the patient's diarrhea. 2. There are changes of cirrhosis as well as hepatic steatosis. Consider steatohepatitis if there are consistent clinical symptoms. 3. Small, left greater than right, pleural effusions. Small amount of  ascites. 4. Mild, presumed reactive, adenopathy along the gastrohepatic ligament. 5. 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 6. Cholelithiasis. Electronically Signed   By: Lajean Manes M.D.   On: 06/11/2016 15:49    Scheduled Meds: . aspirin  324 mg Oral Once  . chlordiazePOXIDE  10 mg Oral Once  . enoxaparin (LOVENOX) injection  40 mg Subcutaneous Daily  . folic acid  1 mg Oral Daily  . furosemide  80 mg Intravenous Q12H  . guaiFENesin  600 mg Oral BID  . levothyroxine  50 mcg Oral QAC breakfast  . lipase/protease/amylase  12,000 Units Oral TID WC  . magnesium sulfate 1 - 4 g bolus IVPB  4 g Intravenous Once  . metoprolol tartrate  12.5 mg Oral BID  . potassium chloride  40 mEq Oral Once  . saccharomyces boulardii  250 mg Oral BID  . sodium chloride flush  3 mL Intravenous Q12H  . spironolactone  50 mg Oral Daily  . sucralfate  1 g Oral TID WC & HS  . thiamine  100 mg Oral Daily  . traZODone  50 mg Oral QHS    Continuous Infusions:    Time spent: 63mins  Amzie Sillas MD, PhD  [facility] Pager [phone]. If 7PM-7AM, please contact night-coverage at [email or web address], password [facility] 06/12/2016, 8:06 AM  LOS: 3 days

## 2016-06-13 DIAGNOSIS — I5033 Acute on chronic diastolic (congestive) heart failure: Secondary | ICD-10-CM

## 2016-06-13 DIAGNOSIS — E876 Hypokalemia: Secondary | ICD-10-CM

## 2016-06-13 DIAGNOSIS — K7031 Alcoholic cirrhosis of liver with ascites: Secondary | ICD-10-CM

## 2016-06-13 DIAGNOSIS — I4891 Unspecified atrial fibrillation: Secondary | ICD-10-CM

## 2016-06-13 DIAGNOSIS — I10 Essential (primary) hypertension: Secondary | ICD-10-CM

## 2016-06-13 LAB — BASIC METABOLIC PANEL
ANION GAP: 5 (ref 5–15)
BUN: <5 mg/dL — ABNORMAL LOW (ref 6–20)
CALCIUM: 7.9 mg/dL — AB (ref 8.9–10.3)
CO2: 30 mmol/L (ref 22–32)
Chloride: 105 mmol/L (ref 101–111)
Creatinine, Ser: 0.74 mg/dL (ref 0.61–1.24)
GFR calc Af Amer: >60 mL/min (ref >60)
GFR calc non Af Amer: >60 mL/min (ref >60)
GLUCOSE: 90 mg/dL (ref 65–99)
POTASSIUM: 2.9 mmol/L — AB (ref 3.5–5.1)
Sodium: 140 mmol/L (ref 135–145)

## 2016-06-13 LAB — HEPATIC FUNCTION PANEL
ALBUMIN: 1.9 g/dL — AB (ref 3.5–5.0)
ALK PHOS: 64 U/L (ref 38–126)
ALT: 19 U/L (ref 17–63)
AST: 57 U/L — AB (ref 15–41)
BILIRUBIN TOTAL: 1.3 mg/dL — AB (ref 0.3–1.2)
Bilirubin, Direct: 0.4 mg/dL (ref 0.1–0.5)
Indirect Bilirubin: 0.9 mg/dL (ref 0.3–0.9)
Total Protein: 5.5 g/dL — ABNORMAL LOW (ref 6.5–8.1)

## 2016-06-13 LAB — MAGNESIUM: Magnesium: 1.6 mg/dL — ABNORMAL LOW (ref 1.7–2.4)

## 2016-06-13 MED ORDER — POTASSIUM CHLORIDE CRYS ER 20 MEQ PO TBCR
40.0000 meq | EXTENDED_RELEASE_TABLET | Freq: Every day | ORAL | Status: DC
  Administered 2016-06-14 – 2016-06-16 (×3): 40 meq via ORAL
  Filled 2016-06-13 (×5): qty 2

## 2016-06-13 MED ORDER — MAGNESIUM CHLORIDE 64 MG PO TBEC
2.0000 | DELAYED_RELEASE_TABLET | Freq: Once | ORAL | Status: AC
  Administered 2016-06-13: 128 mg via ORAL
  Filled 2016-06-13: qty 2

## 2016-06-13 MED ORDER — FUROSEMIDE 10 MG/ML IJ SOLN
40.0000 mg | Freq: Two times a day (BID) | INTRAMUSCULAR | Status: DC
  Administered 2016-06-13 – 2016-06-14 (×3): 40 mg via INTRAVENOUS
  Filled 2016-06-13 (×3): qty 4

## 2016-06-13 MED ORDER — POTASSIUM CHLORIDE CRYS ER 20 MEQ PO TBCR
30.0000 meq | EXTENDED_RELEASE_TABLET | ORAL | Status: AC
  Administered 2016-06-13 (×3): 30 meq via ORAL
  Filled 2016-06-13: qty 1

## 2016-06-13 MED ORDER — MAGNESIUM CHLORIDE 64 MG PO TBEC
2.0000 | DELAYED_RELEASE_TABLET | Freq: Once | ORAL | Status: DC
  Filled 2016-06-13: qty 2

## 2016-06-13 NOTE — Progress Notes (Addendum)
PROGRESS NOTE  Nathan Frank [id number] DOB: 02-May-1941 DOA: 06/09/2016 PCP: Thressa Sheller, MD  HPI/Recap of past 24 hours:  C/o not able to sleep, ask for 100mg  trazadone, repeort he has been taking it for years,  5.9liter urine output last 24hrs # of bm not documented   Assessment/Plan: Active Problems:   Essential hypertension   Atrial fibrillation with RVR (HCC)   Chest pain   Chronic atrial fibrillation (HCC)   Hypokalemia   Alcoholic cirrhosis of liver with ascites (HCC)   Hypomagnesemia   Acute on chronic diastolic CHF (congestive heart failure) (HCC)   Chest pain: h/o NSTEMI currently chest pain free, troponin mild and flat, ekg afib/chronic RBBB, QTC prolongation, no acute changes, on asa/betablocker, ldl 36, not a candidate for statin with chronic liver issues. Cardiology following  Afib/rvr: briefly on cardizem drip initially on presentation, now on betablocker, rate controlled, CHADSvasc score 3 with age 78, h/o chf, he does has h/o gi bleed and h/o alcohol and substance abuse, has not been on anticoagulation, may not be a good candidate for amiodarone due to chronic liver disease, meds adjustment per cardiology  Acute on chronic diastolic chf exacerbation: edematous, on lasix/spironolactone, also has low albumin Appreciate cardiology input  Bilateral arm and leg edema, likely from volume overload, patient does has h/o dvt documented, though patient denies it, will get venous US for further eval.  Hypok/hypomag: replace k/mag, avoid oral mag due to c/o diarrhea  Chronic diarrhea:  recent stool study negative for cdiff and negative GI prc panel, started pancreatic enzyme supplement   Due to c/o abdominal pain, CT Ab /pel no acute findings, does has cholilithiasis, lft wnl, no n/v.  repeat stool gi prc panel negative, # of bm not accurately documented, consider questran if diarrhea significant, he has been on sucralfate which should slow down diarrhea as  well. continue outpatient GI follow up Hepc/cirrhosis/?hcc: need outpatient GI/ID followup  Hypothyroidism: continue synthroid, tsh significantly elevated, need education for meds adherence  H/o DVT: not on anticoagulation due to noncompliance and gi bleed, patient denies h/o DVT  Medication noncompliance/alcohol and polysubstance abuse per chart review h/o heroin use as well  Deconditioning: patient report no driving, does not walk, he stay in bed and watch TV all day for the last 1-2 months, he report significant dyspnea when he walks a few steps, pt recommended home health  Code Status: full  Family Communication: patient   Disposition Plan: remain in the hospital a few days, eventually home with home health, need cardiology clearance   Consultants:  cardiology  Procedures:  none  Antibiotics:  none   Objective: BP 94/71 (BP Location: Left Wrist)   Pulse 93   Temp 98.7 F (37.1 C) (Oral)   Resp 18   Ht 6' (1.829 m)   Wt 95.2 kg (209 lb 12.8 oz)   SpO2 98%   BMI 28.45 kg/m   Intake/Output Summary (Last 24 hours) at 06/13/16 1708 Last data filed at 06/13/16 1336  Gross per 24 hour  Intake              720 ml  Output             3775 ml  Net            -3055 ml   Filed Weights   06/10/16 0536 06/11/16 0440 06/13/16 0500  Weight: 106.8 kg (235 lb 8 oz) 107.5 kg (237 lb) 95.2 kg (209 lb 12.8 oz)  Exam:   General:  Weak but NAD  Cardiovascular: IRRR  Respiratory: crackles at baseline  Abdomen: Soft/ND/NT, positive BS  Musculoskeletal: diffuse pitting  Edema  Neuro: aaox3  Data Reviewed: Basic Metabolic Panel:  Recent Labs Lab 06/09/16 1500 06/09/16 1521 06/10/16 0758 06/11/16 0235 06/12/16 0659 06/13/16 0449  NA 140 143 139 141 137 140  K 3.3* 3.3* 3.1* 3.6 3.1* 2.9*  CL 107 102 107 106 103 105  CO2 26  --  26 28 27 30   GLUCOSE 93 88 90 94 86 90  BUN <5* <3* <5* <5* <5* <5*  CREATININE 0.87 0.70 0.84 0.76 0.79 0.74  CALCIUM 7.9*   --  7.4* 7.7* 7.8* 7.9*  MG 1.3*  --  1.6* 1.5* 1.6* 1.6*  PHOS  --   --  3.8  --   --   --    Liver Function Tests:  Recent Labs Lab 06/09/16 1500 06/10/16 0758 06/12/16 0659 06/13/16 0449  AST 69* 55* 59* 57*  ALT 26 21 19 19   ALKPHOS 67 62 70 64  BILITOT 1.7* 1.5* 1.3* 1.3*  PROT 5.9* 4.9* 5.6* 5.5*  ALBUMIN 2.1* 1.7* 1.9* 1.9*    Recent Labs Lab 06/09/16 1500  LIPASE 23   No results for input(s): AMMONIA in the last 168 hours. CBC:  Recent Labs Lab 06/09/16 1500 06/09/16 1521 06/10/16 0758 06/11/16 0235  WBC 4.0  --  3.8* 3.6*  NEUTROABS 2.5  --   --   --   HGB 12.2* 13.6 10.6* 11.8*  HCT 36.8* 40.0 31.8* 35.9*  MCV 101.9*  --  101.3* 102.3*  PLT 144*  --  124* 132*   Cardiac Enzymes:    Recent Labs Lab 06/09/16 2253 06/10/16 0758 06/10/16 1044  TROPONINI 0.03* 0.03* 0.03*   BNP (last 3 results)  Recent Labs  04/18/16 1855 04/29/16 0305 06/09/16 1500  BNP 375.8* 413.5* 463.2*    ProBNP (last 3 results) No results for input(s): PROBNP in the last 8760 hours.  CBG: No results for input(s): GLUCAP in the last 168 hours.  Recent Results (from the past 240 hour(s))  Urine culture     Status: Abnormal   Collection Time: 06/09/16  8:58 PM  Result Value Ref Range Status   Specimen Description URINE, CLEAN CATCH  Final   Special Requests NONE  Final   Culture <10,000 COLONIES/mL INSIGNIFICANT GROWTH (A)  Final   Report Status 06/11/2016 FINAL  Final  MRSA PCR Screening     Status: None   Collection Time: 06/09/16 10:30 PM  Result Value Ref Range Status   MRSA by PCR NEGATIVE NEGATIVE Final    Comment:        The GeneXpert MRSA Assay (FDA approved for NASAL specimens only), is one component of a comprehensive MRSA colonization surveillance program. It is not intended to diagnose MRSA infection nor to guide or monitor treatment for MRSA infections.   Gastrointestinal Panel by PCR , Stool     Status: None   Collection Time: 06/11/16  11:00 AM  Result Value Ref Range Status   Campylobacter species NOT DETECTED NOT DETECTED Final   Plesimonas shigelloides NOT DETECTED NOT DETECTED Final   Salmonella species NOT DETECTED NOT DETECTED Final   Yersinia enterocolitica NOT DETECTED NOT DETECTED Final   Vibrio species NOT DETECTED NOT DETECTED Final   Vibrio cholerae NOT DETECTED NOT DETECTED Final   Enteroaggregative E coli (EAEC) NOT DETECTED NOT DETECTED Final   Enteropathogenic E coli (EPEC)  NOT DETECTED NOT DETECTED Final   Enterotoxigenic E coli (ETEC) NOT DETECTED NOT DETECTED Final   Shiga like toxin producing E coli (STEC) NOT DETECTED NOT DETECTED Final   Shigella/Enteroinvasive E coli (EIEC) NOT DETECTED NOT DETECTED Final   Cryptosporidium NOT DETECTED NOT DETECTED Final   Cyclospora cayetanensis NOT DETECTED NOT DETECTED Final   Entamoeba histolytica NOT DETECTED NOT DETECTED Final   Giardia lamblia NOT DETECTED NOT DETECTED Final   Adenovirus F40/41 NOT DETECTED NOT DETECTED Final   Astrovirus NOT DETECTED NOT DETECTED Final   Norovirus GI/GII NOT DETECTED NOT DETECTED Final   Rotavirus A NOT DETECTED NOT DETECTED Final   Sapovirus (I, II, IV, and V) NOT DETECTED NOT DETECTED Final     Studies: No results found.  Scheduled Meds: . aspirin  324 mg Oral Once  . chlordiazePOXIDE  10 mg Oral Once  . enoxaparin (LOVENOX) injection  40 mg Subcutaneous Daily  . folic acid  1 mg Oral Daily  . furosemide  40 mg Intravenous Q12H  . guaiFENesin  600 mg Oral BID  . levothyroxine  50 mcg Oral QAC breakfast  . lipase/protease/amylase  12,000 Units Oral TID WC  . magnesium chloride  2 tablet Oral Once  . metoprolol tartrate  12.5 mg Oral BID  . [START ON 06/14/2016] potassium chloride  40 mEq Oral Daily  . saccharomyces boulardii  250 mg Oral BID  . sodium chloride flush  3 mL Intravenous Q12H  . spironolactone  50 mg Oral Daily  . sucralfate  1 g Oral TID WC & HS  . thiamine  100 mg Oral Daily  . traZODone  100  mg Oral QHS    Continuous Infusions:    Time spent: 44mins  Sintia Mckissic MD, PhD  [facility] Pager [phone]. If 7PM-7AM, please contact night-coverage at [email or web address], password [facility] 06/13/2016, 5:08 PM  LOS: 4 days

## 2016-06-13 NOTE — Progress Notes (Signed)
SUBJECTIVE: Says breathing has improved as has leg swelling.  ROS: Other than pertinent positives in "Subjective", all others were reviewed and found to be negative.   Intake/Output Summary (Last 24 hours) at 06/13/16 0842 Last data filed at 06/13/16 0359  Gross per 24 hour  Intake              720 ml  Output             5975 ml  Net            -5255 ml    Current Facility-Administered Medications  Medication Dose Route Frequency Provider Last Rate Last Dose  . acetaminophen (TYLENOL) tablet 650 mg  650 mg Oral Q6H PRN Toy Baker, MD       Or  . acetaminophen (TYLENOL) suppository 650 mg  650 mg Rectal Q6H PRN Toy Baker, MD      . aspirin chewable tablet 324 mg  324 mg Oral Once Shawn C Joy, PA-C      . chlordiazePOXIDE (LIBRIUM) capsule 10 mg  10 mg Oral Once Shawn C Joy, PA-C      . enoxaparin (LOVENOX) injection 40 mg  40 mg Subcutaneous Daily Toy Baker, MD   40 mg at 06/12/16 1004  . folic acid (FOLVITE) tablet 1 mg  1 mg Oral Daily Toy Baker, MD   1 mg at 06/12/16 1004  . furosemide (LASIX) injection 80 mg  80 mg Intravenous Q12H Fay Records, MD   80 mg at 06/12/16 2152  . guaiFENesin (MUCINEX) 12 hr tablet 600 mg  600 mg Oral BID Toy Baker, MD   600 mg at 06/12/16 2151  . HYDROcodone-acetaminophen (NORCO/VICODIN) 5-325 MG per tablet 1-2 tablet  1-2 tablet Oral Q4H PRN Toy Baker, MD   2 tablet at 06/11/16 2239  . levalbuterol (XOPENEX) nebulizer solution 0.63 mg  0.63 mg Nebulization Q6H PRN Toy Baker, MD      . levothyroxine (SYNTHROID, LEVOTHROID) tablet 50 mcg  50 mcg Oral QAC breakfast Toy Baker, MD   50 mcg at 06/13/16 0615  . lipase/protease/amylase (CREON) capsule 12,000 Units  12,000 Units Oral TID WC Florencia Reasons, MD   12,000 Units at 06/12/16 1700  . magnesium chloride (SLOW-MAG) 64 MG SR tablet 128 mg  2 tablet Oral Once Ritta Slot, NP      . metoprolol tartrate (LOPRESSOR) tablet 12.5 mg   12.5 mg Oral BID Toy Baker, MD   12.5 mg at 06/12/16 2151  . ondansetron (ZOFRAN) tablet 4 mg  4 mg Oral Q6H PRN Toy Baker, MD       Or  . ondansetron (ZOFRAN) injection 4 mg  4 mg Intravenous Q6H PRN Toy Baker, MD      . potassium chloride (K-DUR,KLOR-CON) CR tablet 30 mEq  30 mEq Oral Q3H Ritta Slot, NP      . Derrill Memo ON 06/14/2016] potassium chloride SA (K-DUR,KLOR-CON) CR tablet 40 mEq  40 mEq Oral Daily Ritta Slot, NP      . saccharomyces boulardii (FLORASTOR) capsule 250 mg  250 mg Oral BID Toy Baker, MD   250 mg at 06/12/16 2152  . sodium chloride flush (NS) 0.9 % injection 3 mL  3 mL Intravenous Q12H Toy Baker, MD   3 mL at 06/12/16 2156  . spironolactone (ALDACTONE) tablet 50 mg  50 mg Oral Daily Toy Baker, MD   50 mg at 06/12/16 1003  . sucralfate (CARAFATE) tablet  1 g  1 g Oral TID WC & HS Toy Baker, MD   1 g at 06/12/16 2200  . thiamine (VITAMIN B-1) tablet 100 mg  100 mg Oral Daily Toy Baker, MD   100 mg at 06/12/16 1003  . traZODone (DESYREL) tablet 100 mg  100 mg Oral QHS Florencia Reasons, MD   100 mg at 06/12/16 2151    Vitals:   06/12/16 1230 06/12/16 1559 06/12/16 1945 06/13/16 0500  BP:  102/77 99/70 95/74   Pulse: 87 82 96 99  Resp: 13 13 12 12   Temp: 98.9 F (37.2 C) 98.1 F (36.7 C) 98.7 F (37.1 C) 98.7 F (37.1 C)  TempSrc: Oral Oral Oral Oral  SpO2: 96% 96% 97% 95%  Weight:    209 lb 12.8 oz (95.2 kg)  Height:        PHYSICAL EXAM General: NAD HEENT: Normal. Neck: No JVD, no thyromegaly.  Lungs: Clear to auscultation bilaterally with normal respiratory effort. CV: Nondisplaced PMI.  Regular rate and irregular rhythm, normal S1/S2, no S3, no murmur.  1+ pitting pretibial edema b/l.    Abdomen: Soft, nontender, no distention.  Neurologic: Alert and oriented x 3.  Psych: Normal affect. Musculoskeletal: No gross deformities. Extremities: No clubbing or cyanosis.   TELEMETRY: Reviewed  telemetry pt in rate controlled A fib with PVC's.  LABS: Basic Metabolic Panel:  Recent Labs  06/12/16 0659 06/13/16 0449  NA 137 140  K 3.1* 2.9*  CL 103 105  CO2 27 30  GLUCOSE 86 90  BUN <5* <5*  CREATININE 0.79 0.74  CALCIUM 7.8* 7.9*  MG 1.6* 1.6*   Liver Function Tests:  Recent Labs  06/12/16 0659 06/13/16 0449  AST 59* 57*  ALT 19 19  ALKPHOS 70 64  BILITOT 1.3* 1.3*  PROT 5.6* 5.5*  ALBUMIN 1.9* 1.9*   No results for input(s): LIPASE, AMYLASE in the last 72 hours. CBC:  Recent Labs  06/11/16 0235  WBC 3.6*  HGB 11.8*  HCT 35.9*  MCV 102.3*  PLT 132*   Cardiac Enzymes:  Recent Labs  06/10/16 1044  TROPONINI 0.03*   BNP: Invalid input(s): POCBNP D-Dimer: No results for input(s): DDIMER in the last 72 hours. Hemoglobin A1C: No results for input(s): HGBA1C in the last 72 hours. Fasting Lipid Panel: No results for input(s): CHOL, HDL, LDLCALC, TRIG, CHOLHDL, LDLDIRECT in the last 72 hours. Thyroid Function Tests: No results for input(s): TSH, T4TOTAL, T3FREE, THYROIDAB in the last 72 hours.  Invalid input(s): FREET3 Anemia Panel: No results for input(s): VITAMINB12, FOLATE, FERRITIN, TIBC, IRON, RETICCTPCT in the last 72 hours.  RADIOLOGY: Dg Chest 2 View  Result Date: 06/09/2016 CLINICAL DATA:  Chest pain, diarrhea and shortness of breath. History of hypertension, hepatitis and cirrhosis. EXAM: CHEST  2 VIEW COMPARISON:  05/30/2016 FINDINGS: Cardiac silhouette is mildly enlarged. No mediastinal or hilar masses or evidence of adenopathy. There is lung base opacity consistent with combination of small effusions and atelectasis. Infection is possible but felt less likely. Remainder of the lungs is clear. No pneumothorax. Bony thorax is demineralized but intact. IMPRESSION: 1. Mild lung base opacity consistent with small effusions and atelectasis. Consider pneumonia if there are consistent clinical symptoms. 2. Mild cardiomegaly. No evidence of  pulmonary edema. No pneumothorax. Electronically Signed   By: Lajean Manes M.D.   On: 06/09/2016 16:41   Ct Angio Chest Pe W Or Wo Contrast  Result Date: 06/09/2016 CLINICAL DATA:  Chest pain radiating to LEFT arm.  Shortness of breath. History of atrial fibrillation, hypertension, alcohol abuse, hepatitis-C and hepatic cellular carcinoma. EXAM: CT ANGIOGRAPHY CHEST WITH CONTRAST TECHNIQUE: Multidetector CT imaging of the chest was performed using the standard protocol during bolus administration of intravenous contrast. Multiplanar CT image reconstructions and MIPs were obtained to evaluate the vascular anatomy. CONTRAST:  80 cc Isovue 370 COMPARISON:  Chest radiograph June 09, 2016 at 1351 hours and CT abdomen and pelvis May 28, 2016 FINDINGS: PULMONARY ARTERY: Adequate contrast opacification of the pulmonary artery's. Main pulmonary artery is enlarged, 3.7 cm in transaxial dimension. No pulmonary arterial filling defects to the level of the subsegmental branches. MEDIASTINUM: The heart is moderately enlarged. No RIGHT heart strain. No pericardial effusions. Minimal coronary artery calcifications. Sub cm cardio phrenic angle lymph nodes. No mediastinal lymphadenopathy by CT size criteria. LUNGS: Small to moderate lobulated bilateral pleural effusions. Bronchial wall thickening, predominately in the lung bases. 4 mm ground-glass nodule RIGHT lower lobe, below size followup recommendations. Compressive atelectasis. Old RIGHT posterior rib fractures. SOFT TISSUES AND OSSEOUS STRUCTURES: Multiple tiny gallstones partially imaged in the abdomen. Small amount of ascites. Nodular liver compatible with history of cirrhosis ; not tailored for evaluation of patient's known hepatic cellular carcinoma. Mild gynecomastia. Sub cm sclerotic focus LEFT posterior T6 vertebral body favoring bone island. Review of the MIP images confirms the above findings. IMPRESSION: No acute pulmonary embolism. Moderate cardiomegaly and  small to moderate pleural effusions new from May 28, 2016. Bibasilar bronchial wall thickening favoring pulmonary edema. Enlarged main pulmonary artery associated with pulmonary arterial hypertension. Cirrhosis and small amount of ascites. Electronically Signed   By: Elon Alas M.D.   On: 06/09/2016 16:45   Mr Abdomen W Wo Contrast  Result Date: 05/31/2016 CLINICAL DATA:  Diarrhea, cirrhosis. EXAM: MRI ABDOMEN WITHOUT AND WITH CONTRAST TECHNIQUE: Multiplanar multisequence MR imaging of the abdomen was performed both before and after the administration of intravenous contrast. CONTRAST:  23mL MULTIHANCE GADOBENATE DIMEGLUMINE 529 MG/ML IV SOLN COMPARISON:  CT 05/28/2016 FINDINGS: Lower chest: Small bilateral pleural effusions. Anasarca soft tissues the trunk Hepatobiliary: Marked hepatic steatosis noted on opposed phase imaging (series 8). Liver has a nodular contour and is shrunken. There is no focal hepatic lesion. Mild gallbladder wall thickening. Common bile duct is upper limits of normal at 6 mm common hepatic duct similar 7 mm no filling defect within the common bile duct (series 4). Portal veins are patent. Splenic vein is patent. Venous collaterals the epigastric region along the esophagus. No ascites. Delayed contrast imaging demonstrates linear enhancement in the liver consistent with architectural distortion of fibrosis. No discrete enhancing lesion. Pancreas: No duct dilatation.  No abnormal enhancement Spleen: Normal volume. Adrenals/urinary tract: Adrenal glands and kidneys are normal. Stomach/Bowel: Epigastric varices. Stomach limited view of the bowel is unremarkable Vascular/Lymphatic: Abdominal aortic normal caliber. No retroperitoneal periportal lymphadenopathy. Musculoskeletal: No aggressive osseous lesion IMPRESSION: 1. Mild dilatation of the common hepatic duct and common bile duct without choledocholithiasis or obstruction evident. 2. No clear stones within the gallbladder. Sludge  within the gallbladder. No gallbladder distension. 3. Morphologic changes in liver consistent cirrhosis. 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 No evidence hepatoma. No ascites. 4. Marked hepatic steatosis. 5. Small effusions and anasarca in the trunk. Electronically Signed   By: Suzy Bouchard M.D.   On: 05/31/2016 07:16   Ct Abdomen Pelvis W Contrast  Result Date: 06/11/2016 CLINICAL DATA:  Pt c/o persistent diarrhea, peri-umbilical pain; pt has had diarrhea x 2 weeks, without any improvement  EXAM: CT ABDOMEN AND PELVIS WITH CONTRAST TECHNIQUE: Multidetector CT imaging of the abdomen and pelvis was performed using the standard protocol following bolus administration of intravenous contrast. CONTRAST:  100 mL of Isovue-300 intravenous contrast COMPARISON:  05/28/2016 FINDINGS: Lower chest: Small, left greater than right, pleural effusions associated with dependent lower lobe atelectasis. No evidence of pneumonia or edema. Heart normal in size. Hepatobiliary: Heterogeneous liver attenuation and enhancement. Liver is decreased in overall attenuation consistent with fatty infiltration. There is surface nodularity with some central volume loss consistent with cirrhosis. No discrete liver mass. Multiple gallstones. Stable prominence of the common bile duct. No duct stones. Pancreas: Unremarkable. Spleen: Normal. Adrenals/Urinary Tract: No adrenal masses. Bilateral renal cortical thinning. Small nonobstructing stones in the lower pole of the left kidney. No renal masses. No hydronephrosis. Normal ureters. Normal bladder. Stomach/Bowel: Stomach and small bowel are unremarkable. Colon is mostly decompressed but also unremarkable. Normal appendix visualized. Vascular/Lymphatic: There are varices in the upper abdomen with dilation of coronary vein and paraesophageal varices. There are several prominent to mildly enlarged gastrohepatic ligament lymph nodes. There is anterior gastrohepatic ligament  lymph node measuring 11 mm in short axis, slightly larger than on the prior CT. No other adenopathy. Reproductive: Unremarkable. Musculoskeletal: Degenerative changes noted throughout the lumbar spine. Bones are demineralized. No osteoblastic or osteolytic lesions. Other: Small amount of ascites noted adjacent to the liver and in the posterior pelvic recess. There is subcutaneous edema along the flanks. IMPRESSION: 1. No definite acute findings in the abdomen or pelvis. No inflammation or other bowel abnormality to indicate a cause for the patient's diarrhea. 2. There are changes of cirrhosis as well as hepatic steatosis. Consider steatohepatitis if there are consistent clinical symptoms. 3. Small, left greater than right, pleural effusions. Small amount of ascites. 4. Mild, presumed reactive, adenopathy along the gastrohepatic ligament. 5. 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 6. Cholelithiasis. Electronically Signed   By: Lajean Manes M.D.   On: 06/11/2016 15:49   Ct Abdomen Pelvis W Contrast  Result Date: 05/28/2016 CLINICAL DATA:  Abdominal pain.  Diarrhea for 1 week. EXAM: CT ABDOMEN AND PELVIS WITH CONTRAST TECHNIQUE: Multidetector CT imaging of the abdomen and pelvis was performed using the standard protocol following bolus administration of intravenous contrast. CONTRAST:  154mL ISOVUE-300 IOPAMIDOL (ISOVUE-300) INJECTION 61% COMPARISON:  CT 04/18/2016 FINDINGS: Lower chest: Small pleural effusions, lost slightly larger than on prior CT. Adjacent atelectasis in the lower lobes. Liver: Diffusely decreased density consistent with steatosis. Liver parenchyma is diffusely heterogeneous. Probable fibrosis linear bands. Nodular hepatic contours consistent with cirrhosis. Small amount of perihepatic fluid. Hepatobiliary: Multiple gallstones within physiologically distended gallbladder. Prominent common bile duct measuring 8-9 mm distally. No calcified choledocholithiasis.  Pancreas: Parenchymal atrophy. No ductal dilatation or inflammation. Spleen: Normal.  No splenomegaly. Adrenal glands: No nodule. Kidneys: Symmetric renal enhancement. No hydronephrosis. Listen about delayed excretion on delayed phase imaging. Stomach/Bowel: Paraesophageal and mild perigastric varices. Stomach physiologically distended. There are no dilated or thickened small bowel loops. Wall thickening of the cecum and proximal ascending colon without surrounding fat stranding. Stool contrast throughout the remainder the colon. The appendix is normal. Vascular/Lymphatic: Prominent porta hepatis nodes. No retroperitoneal adenopathy. Abdominal aorta is normal in caliber. Atherosclerosis of the abdominal aorta without aneurysm. Reproductive: No acute abnormality. Bladder: Minimally distended without wall thickening. Other: Small volume intra-abdominal ascites tracking in the pelvis. No free air. No intra-abdominal abscess. Mild whole body wall edema, increased from prior CT. Musculoskeletal: There are no acute or  suspicious osseous abnormalities. Degenerative change in the spine, stable. IMPRESSION: 1. Colonic wall thickening involving the cecum and proximal ascending colon. This may be colitis, infectious or inflammatory, however given cirrhosis, portal enteropathy could have this appearance. 2. Advanced cirrhosis with significant fatty infiltration of the liver. Peri-gastric and paraesophageal varices. Small volume of ascites. 3. Cholelithiasis without signs of gallbladder inflammation. 4. Small pleural effusions. Electronically Signed   By: Jeb Levering M.D.   On: 05/28/2016 02:53   Dg Chest Port 1 View  Result Date: 05/30/2016 CLINICAL DATA:  Line placement EXAM: PORTABLE CHEST 1 VIEW COMPARISON:  04/28/2016 FINDINGS: Right PICC line is in place with the tip in the upper SVC. Heart is normal size. No confluent airspace opacities or effusions. No acute bony abnormality. IMPRESSION: Right PICC line tip in  the upper SVC. No active disease. Electronically Signed   By: Rolm Baptise M.D.   On: 05/30/2016 11:14      ASSESSMENT AND PLAN: 1. Acute on chronic diastolic HF: Diuresed 7.2 L in past 48 hrs. Will decrease Lasix to 40 mg IV BID. K low, needs repletement.  2. Atrial fib: Rate controlled on metoprolol. No anticoagulation due to noncompliance, ETOH abuse.  3. Hypokalemia: Will replete.   Kate Sable, M.D., F.A.C.C.

## 2016-06-13 NOTE — Progress Notes (Signed)
Occupational Therapy Treatment Patient Details Name: Nathan Frank MRN: [id number] DOB: 02/07/1941 Today's Date: 06/13/2016    History of present illness 75 y.o. male with medical history significant of NSTEMI 2016, a.fib w RVR, cirrhosis, depression polysubstance abuse, EtOH abuse,   hepatitis C, DVT, chronic diastolic CHF dilated cardiomyopathy Presented with Chest pain since last night radiating to left arm, ongoing diarrhea.   OT comments  Pt. Provided with A/E and demo of use.  Focus of next session is pt. To return demo and provide continued education on adapting ADLs.   Follow Up Recommendations  Home health OT;Supervision/Assistance - 24 hour    Equipment Recommendations  3 in 1 bedside comode    Recommendations for Other Services      Precautions / Restrictions Precautions Precautions: Fall       Mobility Bed Mobility                  Transfers                      Balance                                   ADL Overall ADL's : Needs assistance/impaired             Lower Body Bathing: With adaptive equipment Lower Body Bathing Details (indicate cue type and reason): provided demo of use of A/E, pt. declined return demo      Lower Body Dressing: With adaptive equipment Lower Body Dressing Details (indicate cue type and reason): provided demo of use of A/E, pt. declined return demo                General ADL Comments: pt. agitated upon arrival yelling "yall never came back and i sat in that damn chair 2 hours yesterday so i got up myself".   first apologized for his feelings.  reviewed that all staff are trained and equipped to assist with back to bed.  reviewed that in the future when he is ready to change locations he needs to push call bell or call  rn for assistance.  he agreed he did not want to fall and is eager for d/c home.  provided a/e and reviewed use of each item.  pt. refused eob and oob for return demo.  will come  back for additional visit for pt. to return demo of a/e and further education on modification and adapting to decrease burden of adls      Vision                     Perception     Praxis      Cognition   Behavior During Therapy: Agitated Overall Cognitive Status: Within Functional Limits for tasks assessed                       Extremity/Trunk Assessment               Exercises     Shoulder Instructions       General Comments      Pertinent Vitals/ Pain       Pain Assessment: No/denies pain  Home Living  Prior Functioning/Environment              Frequency       Progress Toward Goals  OT Goals(current goals can now be found in the care plan section)  Progress towards OT goals: Progressing toward goals     Plan Discharge plan remains appropriate    Co-evaluation                 End of Session Equipment Utilized During Treatment: Other (comment) (A/E)   Activity Tolerance     Patient Left     Nurse Communication          Time: [id number] OT Time Calculation (min): 16 min  Charges: OT General Charges $OT Visit: 1 Procedure OT Treatments $Self Care/Home Management : 8-22 mins  Janice Coffin, COTA/L 06/13/2016, 10:41 AM

## 2016-06-14 ENCOUNTER — Inpatient Hospital Stay (HOSPITAL_COMMUNITY): Payer: Medicare Other

## 2016-06-14 DIAGNOSIS — M7989 Other specified soft tissue disorders: Secondary | ICD-10-CM

## 2016-06-14 LAB — CBC
HCT: 37.3 % — ABNORMAL LOW (ref 39.0–52.0)
Hemoglobin: 12.5 g/dL — ABNORMAL LOW (ref 13.0–17.0)
MCH: 34.4 pg — AB (ref 26.0–34.0)
MCHC: 33.5 g/dL (ref 30.0–36.0)
MCV: 102.8 fL — AB (ref 78.0–100.0)
PLATELETS: 139 10*3/uL — AB (ref 150–400)
RBC: 3.63 MIL/uL — ABNORMAL LOW (ref 4.22–5.81)
RDW: 16.2 % — AB (ref 11.5–15.5)
WBC: 4.2 10*3/uL (ref 4.0–10.5)

## 2016-06-14 LAB — COMPREHENSIVE METABOLIC PANEL
ALT: 19 U/L (ref 17–63)
AST: 65 U/L — AB (ref 15–41)
Albumin: 1.8 g/dL — ABNORMAL LOW (ref 3.5–5.0)
Alkaline Phosphatase: 82 U/L (ref 38–126)
Anion gap: 8 (ref 5–15)
BUN: 6 mg/dL (ref 6–20)
CHLORIDE: 104 mmol/L (ref 101–111)
CO2: 28 mmol/L (ref 22–32)
Calcium: 8.1 mg/dL — ABNORMAL LOW (ref 8.9–10.3)
Creatinine, Ser: 0.84 mg/dL (ref 0.61–1.24)
Glucose, Bld: 97 mg/dL (ref 65–99)
POTASSIUM: 4.5 mmol/L (ref 3.5–5.1)
SODIUM: 140 mmol/L (ref 135–145)
Total Bilirubin: 1 mg/dL (ref 0.3–1.2)
Total Protein: 5.9 g/dL — ABNORMAL LOW (ref 6.5–8.1)

## 2016-06-14 LAB — MAGNESIUM: Magnesium: 1.5 mg/dL — ABNORMAL LOW (ref 1.7–2.4)

## 2016-06-14 LAB — AMMONIA: AMMONIA: 57 umol/L — AB (ref 9–35)

## 2016-06-14 MED ORDER — FUROSEMIDE 40 MG PO TABS
40.0000 mg | ORAL_TABLET | Freq: Every day | ORAL | Status: DC
  Administered 2016-06-15 – 2016-06-16 (×2): 40 mg via ORAL
  Filled 2016-06-14 (×2): qty 1

## 2016-06-14 MED ORDER — METOPROLOL TARTRATE 12.5 MG HALF TABLET
12.5000 mg | ORAL_TABLET | Freq: Once | ORAL | Status: AC
  Administered 2016-06-14: 12.5 mg via ORAL
  Filled 2016-06-14: qty 1

## 2016-06-14 MED ORDER — MAGNESIUM SULFATE 4 GM/100ML IV SOLN
4.0000 g | Freq: Once | INTRAVENOUS | Status: AC
  Administered 2016-06-14: 4 g via INTRAVENOUS
  Filled 2016-06-14: qty 100

## 2016-06-14 MED ORDER — SPIRONOLACTONE 25 MG PO TABS: 25.0000 mg | ORAL_TABLET | Freq: Every day | ORAL | Status: DC

## 2016-06-14 MED ORDER — METOPROLOL TARTRATE 25 MG PO TABS
25.0000 mg | ORAL_TABLET | Freq: Two times a day (BID) | ORAL | Status: DC
  Administered 2016-06-14: 25 mg via ORAL
  Filled 2016-06-14: qty 1

## 2016-06-14 MED ORDER — OFF THE BEAT BOOK
Freq: Once | Status: AC
  Administered 2016-06-14: 14:00:00
  Filled 2016-06-14: qty 1

## 2016-06-14 NOTE — Progress Notes (Signed)
PROGRESS NOTE  DEMONTEZ Frank [id number] DOB: 12/10/1940 DOA: 06/09/2016 PCP: Thressa Sheller, MD  HPI/Recap of past 24 hours:  Report feeling better, slept well last night, ,  2.4 liter urine output last 24hrs Report 2bm yesterday, 2 bm documented   Assessment/Plan: Active Problems:   Essential hypertension   Atrial fibrillation with RVR (HCC)   Chest pain   Chronic atrial fibrillation (HCC)   Hypokalemia   Alcoholic cirrhosis of liver with ascites (HCC)   Hypomagnesemia   Acute on chronic diastolic CHF (congestive heart failure) (HCC)   Chest pain: h/o NSTEMI currently chest pain free, troponin mild and flat, ekg afib/chronic RBBB, QTC prolongation, no acute changes, on asa/betablocker, ldl 36, not a candidate for statin with chronic liver issues. Cardiology following  Afib/rvr: briefly on cardizem drip initially on presentation, now on betablocker, rate controlled, CHADSvasc score 3 with age 49, h/o chf, he does has h/o gi bleed and h/o alcohol and substance abuse, has not been on anticoagulation, may not be a good candidate for amiodarone due to chronic liver disease,  meds adjustment per cardiology, currently on increased dose of lopressor and asa 325.  Acute on chronic diastolic chf exacerbation: edema improving, on lasix/spironolactone, dose adjustment per cardiology, patient also has low albumin Appreciate cardiology input  Bilateral arm and leg edema, likely from volume overload, patient does has h/o dvt documented, though patient denies it, will get venous US for further eval.  Hypok/hypomag: replace k/mag, avoid oral mag due to c/o diarrhea  Chronic diarrhea:  recent stool study negative for cdiff and negative GI prc panel, started pancreatic enzyme supplement   Due to c/o abdominal pain, CT Ab /pel no acute findings, does has cholilithiasis, lft wnl, no n/v.  repeat stool gi prc panel negative, # of bm not accurately documented, consider questran if diarrhea  significant, he has been on sucralfate which should slow down diarrhea as well. continue outpatient GI follow up  Hepc/cirrhosis/?hcc: elevated ammonia level , will need to have bm 3-4 times a day, need outpatient GI/ID followup, patient expressed understanding  Hypothyroidism: continue synthroid, tsh significantly elevated, need education for meds adherence  H/o DVT: not on anticoagulation due to noncompliance and gi bleed, patient denies h/o DVT  Medication noncompliance/alcohol and polysubstance abuse per chart review h/o heroin use as well  Deconditioning: patient report no driving, does not walk, he stay in bed and watch TV all day for the last 1-2 months, he report significant dyspnea when he walks a few steps, pt recommended home health  Code Status: full  Family Communication: patient   Disposition Plan: remain in the hospital a few days, eventually home with home health, need cardiology clearance   Consultants:  cardiology  Procedures:  none  Antibiotics:  none   Objective: BP 105/66 (BP Location: Left Wrist)   Pulse (!) 103   Temp 97.6 F (36.4 C) (Oral)   Resp 20   Ht 6' (1.829 m)   Wt 94.2 kg (207 lb 9.6 oz)   SpO2 95%   BMI 28.16 kg/m   Intake/Output Summary (Last 24 hours) at 06/14/16 0810 Last data filed at 06/14/16 0600  Gross per 24 hour  Intake              603 ml  Output             2450 ml  Net            -1847 ml   [employer]  06/11/16 0440 06/13/16 0500 06/14/16 0500  Weight: 107.5 kg (237 lb) 95.2 kg (209 lb 12.8 oz) 94.2 kg (207 lb 9.6 oz)    Exam:   General:  Weak but NAD  Cardiovascular: IRRR  Respiratory: crackles at baseline has decreased  Abdomen: Soft/ND/NT, positive BS  Musculoskeletal: diffuse pitting  Edema has improved  Neuro: aaox3  Data Reviewed: Basic Metabolic Panel:  Recent Labs Lab 06/10/16 0758 06/11/16 0235 06/12/16 0659 06/13/16 0449 06/14/16 0527  NA 139 141 137 140 140  K 3.1* 3.6 3.1*  2.9* 4.5  CL 107 106 103 105 104  CO2 26 28 27 30 28   GLUCOSE 90 94 86 90 97  BUN <5* <5* <5* <5* 6  CREATININE 0.84 0.76 0.79 0.74 0.84  CALCIUM 7.4* 7.7* 7.8* 7.9* 8.1*  MG 1.6* 1.5* 1.6* 1.6* 1.5*  PHOS 3.8  --   --   --   --    Liver Function Tests:  Recent Labs Lab 06/09/16 1500 06/10/16 0758 06/12/16 0659 06/13/16 0449 06/14/16 0527  AST 69* 55* 59* 57* 65*  ALT 26 21 19 19 19   ALKPHOS 67 62 70 64 82  BILITOT 1.7* 1.5* 1.3* 1.3* 1.0  PROT 5.9* 4.9* 5.6* 5.5* 5.9*  ALBUMIN 2.1* 1.7* 1.9* 1.9* 1.8*    Recent Labs Lab 06/09/16 1500  LIPASE 23    Recent Labs Lab 06/14/16 0527  AMMONIA 57*   CBC:  Recent Labs Lab 06/09/16 1500 06/09/16 1521 06/10/16 0758 06/11/16 0235 06/14/16 0527  WBC 4.0  --  3.8* 3.6* 4.2  NEUTROABS 2.5  --   --   --   --   HGB 12.2* 13.6 10.6* 11.8* 12.5*  HCT 36.8* 40.0 31.8* 35.9* 37.3*  MCV 101.9*  --  101.3* 102.3* 102.8*  PLT 144*  --  124* 132* 139*   Cardiac Enzymes:    Recent Labs Lab 06/09/16 2253 06/10/16 0758 06/10/16 1044  TROPONINI 0.03* 0.03* 0.03*   BNP (last 3 results)  Recent Labs  04/18/16 1855 04/29/16 0305 06/09/16 1500  BNP 375.8* 413.5* 463.2*    ProBNP (last 3 results) No results for input(s): PROBNP in the last 8760 hours.  CBG: No results for input(s): GLUCAP in the last 168 hours.  Recent Results (from the past 240 hour(s))  Urine culture     Status: Abnormal   Collection Time: 06/09/16  8:58 PM  Result Value Ref Range Status   Specimen Description URINE, CLEAN CATCH  Final   Special Requests NONE  Final   Culture <10,000 COLONIES/mL INSIGNIFICANT GROWTH (A)  Final   Report Status 06/11/2016 FINAL  Final  MRSA PCR Screening     Status: None   Collection Time: 06/09/16 10:30 PM  Result Value Ref Range Status   MRSA by PCR NEGATIVE NEGATIVE Final    Comment:        The GeneXpert MRSA Assay (FDA approved for NASAL specimens only), is one component of a comprehensive MRSA  colonization surveillance program. It is not intended to diagnose MRSA infection nor to guide or monitor treatment for MRSA infections.   Gastrointestinal Panel by PCR , Stool     Status: None   Collection Time: 06/11/16 11:00 AM  Result Value Ref Range Status   Campylobacter species NOT DETECTED NOT DETECTED Final   Plesimonas shigelloides NOT DETECTED NOT DETECTED Final   Salmonella species NOT DETECTED NOT DETECTED Final   Yersinia enterocolitica NOT DETECTED NOT DETECTED Final   Vibrio species NOT  DETECTED NOT DETECTED Final   Vibrio cholerae NOT DETECTED NOT DETECTED Final   Enteroaggregative E coli (EAEC) NOT DETECTED NOT DETECTED Final   Enteropathogenic E coli (EPEC) NOT DETECTED NOT DETECTED Final   Enterotoxigenic E coli (ETEC) NOT DETECTED NOT DETECTED Final   Shiga like toxin producing E coli (STEC) NOT DETECTED NOT DETECTED Final   Shigella/Enteroinvasive E coli (EIEC) NOT DETECTED NOT DETECTED Final   Cryptosporidium NOT DETECTED NOT DETECTED Final   Cyclospora cayetanensis NOT DETECTED NOT DETECTED Final   Entamoeba histolytica NOT DETECTED NOT DETECTED Final   Giardia lamblia NOT DETECTED NOT DETECTED Final   Adenovirus F40/41 NOT DETECTED NOT DETECTED Final   Astrovirus NOT DETECTED NOT DETECTED Final   Norovirus GI/GII NOT DETECTED NOT DETECTED Final   Rotavirus A NOT DETECTED NOT DETECTED Final   Sapovirus (I, II, IV, and V) NOT DETECTED NOT DETECTED Final     Studies: No results found.  Scheduled Meds: . aspirin  324 mg Oral Once  . enoxaparin (LOVENOX) injection  40 mg Subcutaneous Daily  . folic acid  1 mg Oral Daily  . furosemide  40 mg Intravenous Q12H  . guaiFENesin  600 mg Oral BID  . levothyroxine  50 mcg Oral QAC breakfast  . lipase/protease/amylase  12,000 Units Oral TID WC  . magnesium sulfate 1 - 4 g bolus IVPB  4 g Intravenous Once  . metoprolol tartrate  12.5 mg Oral BID  . potassium chloride  40 mEq Oral Daily  . saccharomyces boulardii   250 mg Oral BID  . sodium chloride flush  3 mL Intravenous Q12H  . spironolactone  50 mg Oral Daily  . sucralfate  1 g Oral TID WC & HS  . thiamine  100 mg Oral Daily    Continuous Infusions:    Time spent: 3mins  Nathan Sladek MD, PhD  [facility] Pager [phone]. If 7PM-7AM, please contact night-coverage at [email or web address], password [facility] 06/14/2016, 8:10 AM  LOS: 5 days

## 2016-06-14 NOTE — Progress Notes (Signed)
*  Preliminary Results* Bilateral upper extremity venous duplex completed. Bilateral upper extremities are negative for deep and superficial vein thrombosis.  Bilateral lower extremity venous duplex completed. Findings suggest age indeterminate right distal femoral vein, right popliteal vein, and left proximal femoral veins.  Preliminary results discussed with Dr. Erlinda Hong.  06/14/2016 2:48 PM  Maudry Mayhew, BS, RVT, RDCS, RDMS

## 2016-06-14 NOTE — Plan of Care (Signed)
Problem: Fluid Volume: Goal: Ability to maintain a balanced intake and output will improve Outcome: Progressing Continue Strict I's and O's. Condom Cath.

## 2016-06-14 NOTE — Progress Notes (Signed)
OT Cancellation Note  Patient Details Name: Nathan Frank MRN: [id number] DOB: 03/23/1941   Cancelled Treatment:    Reason Eval/Treat Not Completed: Other (comment)attempted skilled OT tx session for pt. To continue education and use of A/E provided previous day.  Pt. States "oh no, not now I ain't getting out of bed, I just finished eating".  Reviewed I needed him to demonstrate ability to use A/E that was provided to him.  He continued to shake his head "no" direction.   Janice Coffin, COTA/L 06/14/2016, 11:07 AM

## 2016-06-14 NOTE — Progress Notes (Signed)
SUBJECTIVE: Denies CP, leg swelling, shortness of breath.   ROS: Other than pertinent positives in "Subjective", all others were reviewed and found to be negative.   Intake/Output Summary (Last 24 hours) at 06/14/16 0948 Last data filed at 06/14/16 0900  Gross per 24 hour  Intake              603 ml  Output             2450 ml  Net            -1847 ml    Current Facility-Administered Medications  Medication Dose Route Frequency Provider Last Rate Last Dose  . acetaminophen (TYLENOL) tablet 650 mg  650 mg Oral Q6H PRN Toy Baker, MD       Or  . acetaminophen (TYLENOL) suppository 650 mg  650 mg Rectal Q6H PRN Toy Baker, MD      . aspirin chewable tablet 324 mg  324 mg Oral Once Shawn C Joy, PA-C      . enoxaparin (LOVENOX) injection 40 mg  40 mg Subcutaneous Daily Toy Baker, MD   40 mg at 06/14/16 0818  . folic acid (FOLVITE) tablet 1 mg  1 mg Oral Daily Toy Baker, MD   1 mg at 06/14/16 0820  . furosemide (LASIX) injection 40 mg  40 mg Intravenous Q12H Herminio Commons, MD   40 mg at 06/14/16 0821  . guaiFENesin (MUCINEX) 12 hr tablet 600 mg  600 mg Oral BID Toy Baker, MD   600 mg at 06/13/16 2225  . HYDROcodone-acetaminophen (NORCO/VICODIN) 5-325 MG per tablet 1-2 tablet  1-2 tablet Oral Q4H PRN Toy Baker, MD   2 tablet at 06/11/16 2239  . levalbuterol (XOPENEX) nebulizer solution 0.63 mg  0.63 mg Nebulization Q6H PRN Toy Baker, MD      . levothyroxine (SYNTHROID, LEVOTHROID) tablet 50 mcg  50 mcg Oral QAC breakfast Toy Baker, MD   50 mcg at 06/14/16 0631  . lipase/protease/amylase (CREON) capsule 12,000 Units  12,000 Units Oral TID WC Florencia Reasons, MD   12,000 Units at 06/14/16 0820  . magnesium sulfate IVPB 4 g 100 mL  4 g Intravenous Once Florencia Reasons, MD   4 g at 06/14/16 0818  . metoprolol tartrate (LOPRESSOR) tablet 12.5 mg  12.5 mg Oral BID Toy Baker, MD   12.5 mg at 06/14/16 0820  . ondansetron  (ZOFRAN) tablet 4 mg  4 mg Oral Q6H PRN Toy Baker, MD       Or  . ondansetron (ZOFRAN) injection 4 mg  4 mg Intravenous Q6H PRN Toy Baker, MD      . potassium chloride SA (K-DUR,KLOR-CON) CR tablet 40 mEq  40 mEq Oral Daily Ritta Slot, NP   40 mEq at 06/14/16 0819  . saccharomyces boulardii (FLORASTOR) capsule 250 mg  250 mg Oral BID Toy Baker, MD   250 mg at 06/13/16 2225  . sodium chloride flush (NS) 0.9 % injection 3 mL  3 mL Intravenous Q12H Toy Baker, MD   3 mL at 06/13/16 2225  . spironolactone (ALDACTONE) tablet 50 mg  50 mg Oral Daily Toy Baker, MD   50 mg at 06/14/16 0820  . sucralfate (CARAFATE) tablet 1 g  1 g Oral TID WC & HS Toy Baker, MD   1 g at 06/14/16 0819  . thiamine (VITAMIN B-1) tablet 100 mg  100 mg Oral Daily Toy Baker, MD   100 mg at 06/14/16  [id number]    Vitals:   06/13/16 0743 06/13/16 1325 06/13/16 2134 06/14/16 0500  BP:  94/71 101/72 105/66  Pulse: 94 93 (!) 116 (!) 103  Resp: 15 18 10 20   Temp:   98.9 F (37.2 C) 97.6 F (36.4 C)  TempSrc:   Oral Oral  SpO2: 99% 98% 94% 95%  Weight:    207 lb 9.6 oz (94.2 kg)  Height:        PHYSICAL EXAM General: NAD HEENT: Normal. Neck: No JVD, no thyromegaly.  Lungs: Clear to auscultation bilaterally with normal respiratory effort. CV: Nondisplaced PMI.  Regular rate and irregular rhythm, normal S1/S2, no S3, no murmur. Trivial pretibial edema b/l.    Abdomen: Soft, nontender, no distention.  Neurologic: Alert and oriented x 3.  Psych: Normal affect. Musculoskeletal: No gross deformities. Extremities: No clubbing or cyanosis.   TELEMETRY: Reviewed telemetry pt in rapid controlled A fib with PVC's.  LABS: Basic Metabolic Panel:  Recent Labs  06/13/16 0449 06/14/16 0527  NA 140 140  K 2.9* 4.5  CL 105 104  CO2 30 28  GLUCOSE 90 97  BUN <5* 6  CREATININE 0.74 0.84  CALCIUM 7.9* 8.1*  MG 1.6* 1.5*   Liver Function Tests:  Recent Labs   06/13/16 0449 06/14/16 0527  AST 57* 65*  ALT 19 19  ALKPHOS 64 82  BILITOT 1.3* 1.0  PROT 5.5* 5.9*  ALBUMIN 1.9* 1.8*   No results for input(s): LIPASE, AMYLASE in the last 72 hours. CBC:  Recent Labs  06/14/16 0527  WBC 4.2  HGB 12.5*  HCT 37.3*  MCV 102.8*  PLT 139*   Cardiac Enzymes: No results for input(s): CKTOTAL, CKMB, CKMBINDEX, TROPONINI in the last 72 hours. BNP: Invalid input(s): POCBNP D-Dimer: No results for input(s): DDIMER in the last 72 hours. Hemoglobin A1C: No results for input(s): HGBA1C in the last 72 hours. Fasting Lipid Panel: No results for input(s): CHOL, HDL, LDLCALC, TRIG, CHOLHDL, LDLDIRECT in the last 72 hours. Thyroid Function Tests: No results for input(s): TSH, T4TOTAL, T3FREE, THYROIDAB in the last 72 hours.  Invalid input(s): FREET3 Anemia Panel: No results for input(s): VITAMINB12, FOLATE, FERRITIN, TIBC, IRON, RETICCTPCT in the last 72 hours.  RADIOLOGY: Dg Chest 2 View  Result Date: 06/09/2016 CLINICAL DATA:  Chest pain, diarrhea and shortness of breath. History of hypertension, hepatitis and cirrhosis. EXAM: CHEST  2 VIEW COMPARISON:  05/30/2016 FINDINGS: Cardiac silhouette is mildly enlarged. No mediastinal or hilar masses or evidence of adenopathy. There is lung base opacity consistent with combination of small effusions and atelectasis. Infection is possible but felt less likely. Remainder of the lungs is clear. No pneumothorax. Bony thorax is demineralized but intact. IMPRESSION: 1. Mild lung base opacity consistent with small effusions and atelectasis. Consider pneumonia if there are consistent clinical symptoms. 2. Mild cardiomegaly. No evidence of pulmonary edema. No pneumothorax. Electronically Signed   By: Lajean Manes M.D.   On: 06/09/2016 16:41   Ct Angio Chest Pe W Or Wo Contrast  Result Date: 06/09/2016 CLINICAL DATA:  Chest pain radiating to LEFT arm. Shortness of breath. History of atrial fibrillation, hypertension,  alcohol abuse, hepatitis-C and hepatic cellular carcinoma. EXAM: CT ANGIOGRAPHY CHEST WITH CONTRAST TECHNIQUE: Multidetector CT imaging of the chest was performed using the standard protocol during bolus administration of intravenous contrast. Multiplanar CT image reconstructions and MIPs were obtained to evaluate the vascular anatomy. CONTRAST:  80 cc Isovue 370 COMPARISON:  Chest radiograph June 09, 2016 at 1351  hours and CT abdomen and pelvis May 28, 2016 FINDINGS: PULMONARY ARTERY: Adequate contrast opacification of the pulmonary artery's. Main pulmonary artery is enlarged, 3.7 cm in transaxial dimension. No pulmonary arterial filling defects to the level of the subsegmental branches. MEDIASTINUM: The heart is moderately enlarged. No RIGHT heart strain. No pericardial effusions. Minimal coronary artery calcifications. Sub cm cardio phrenic angle lymph nodes. No mediastinal lymphadenopathy by CT size criteria. LUNGS: Small to moderate lobulated bilateral pleural effusions. Bronchial wall thickening, predominately in the lung bases. 4 mm ground-glass nodule RIGHT lower lobe, below size followup recommendations. Compressive atelectasis. Old RIGHT posterior rib fractures. SOFT TISSUES AND OSSEOUS STRUCTURES: Multiple tiny gallstones partially imaged in the abdomen. Small amount of ascites. Nodular liver compatible with history of cirrhosis ; not tailored for evaluation of patient's known hepatic cellular carcinoma. Mild gynecomastia. Sub cm sclerotic focus LEFT posterior T6 vertebral body favoring bone island. Review of the MIP images confirms the above findings. IMPRESSION: No acute pulmonary embolism. Moderate cardiomegaly and small to moderate pleural effusions new from May 28, 2016. Bibasilar bronchial wall thickening favoring pulmonary edema. Enlarged main pulmonary artery associated with pulmonary arterial hypertension. Cirrhosis and small amount of ascites. Electronically Signed   By: Elon Alas M.D.   On: 06/09/2016 16:45   Mr Abdomen W Wo Contrast  Result Date: 05/31/2016 CLINICAL DATA:  Diarrhea, cirrhosis. EXAM: MRI ABDOMEN WITHOUT AND WITH CONTRAST TECHNIQUE: Multiplanar multisequence MR imaging of the abdomen was performed both before and after the administration of intravenous contrast. CONTRAST:  60mL MULTIHANCE GADOBENATE DIMEGLUMINE 529 MG/ML IV SOLN COMPARISON:  CT 05/28/2016 FINDINGS: Lower chest: Small bilateral pleural effusions. Anasarca soft tissues the trunk Hepatobiliary: Marked hepatic steatosis noted on opposed phase imaging (series 8). Liver has a nodular contour and is shrunken. There is no focal hepatic lesion. Mild gallbladder wall thickening. Common bile duct is upper limits of normal at 6 mm common hepatic duct similar 7 mm no filling defect within the common bile duct (series 4). Portal veins are patent. Splenic vein is patent. Venous collaterals the epigastric region along the esophagus. No ascites. Delayed contrast imaging demonstrates linear enhancement in the liver consistent with architectural distortion of fibrosis. No discrete enhancing lesion. Pancreas: No duct dilatation.  No abnormal enhancement Spleen: Normal volume. Adrenals/urinary tract: Adrenal glands and kidneys are normal. Stomach/Bowel: Epigastric varices. Stomach limited view of the bowel is unremarkable Vascular/Lymphatic: Abdominal aortic normal caliber. No retroperitoneal periportal lymphadenopathy. Musculoskeletal: No aggressive osseous lesion IMPRESSION: 1. Mild dilatation of the common hepatic duct and common bile duct without choledocholithiasis or obstruction evident. 2. No clear stones within the gallbladder. Sludge within the gallbladder. No gallbladder distension. 3. Morphologic changes in liver consistent cirrhosis. 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 No evidence hepatoma. No ascites. 4. Marked hepatic steatosis. 5. Small effusions and anasarca in the trunk.  Electronically Signed   By: Suzy Bouchard M.D.   On: 05/31/2016 07:16   Ct Abdomen Pelvis W Contrast  Result Date: 06/11/2016 CLINICAL DATA:  Pt c/o persistent diarrhea, peri-umbilical pain; pt has had diarrhea x 2 weeks, without any improvement EXAM: CT ABDOMEN AND PELVIS WITH CONTRAST TECHNIQUE: Multidetector CT imaging of the abdomen and pelvis was performed using the standard protocol following bolus administration of intravenous contrast. CONTRAST:  100 mL of Isovue-300 intravenous contrast COMPARISON:  05/28/2016 FINDINGS: Lower chest: Small, left greater than right, pleural effusions associated with dependent lower lobe atelectasis. No evidence of pneumonia or edema. Heart normal in size. Hepatobiliary: Heterogeneous liver  attenuation and enhancement. Liver is decreased in overall attenuation consistent with fatty infiltration. There is surface nodularity with some central volume loss consistent with cirrhosis. No discrete liver mass. Multiple gallstones. Stable prominence of the common bile duct. No duct stones. Pancreas: Unremarkable. Spleen: Normal. Adrenals/Urinary Tract: No adrenal masses. Bilateral renal cortical thinning. Small nonobstructing stones in the lower pole of the left kidney. No renal masses. No hydronephrosis. Normal ureters. Normal bladder. Stomach/Bowel: Stomach and small bowel are unremarkable. Colon is mostly decompressed but also unremarkable. Normal appendix visualized. Vascular/Lymphatic: There are varices in the upper abdomen with dilation of coronary vein and paraesophageal varices. There are several prominent to mildly enlarged gastrohepatic ligament lymph nodes. There is anterior gastrohepatic ligament lymph node measuring 11 mm in short axis, slightly larger than on the prior CT. No other adenopathy. Reproductive: Unremarkable. Musculoskeletal: Degenerative changes noted throughout the lumbar spine. Bones are demineralized. No osteoblastic or osteolytic lesions. Other:  Small amount of ascites noted adjacent to the liver and in the posterior pelvic recess. There is subcutaneous edema along the flanks. IMPRESSION: 1. No definite acute findings in the abdomen or pelvis. No inflammation or other bowel abnormality to indicate a cause for the patient's diarrhea. 2. There are changes of cirrhosis as well as hepatic steatosis. Consider steatohepatitis if there are consistent clinical symptoms. 3. Small, left greater than right, pleural effusions. Small amount of ascites. 4. Mild, presumed reactive, adenopathy along the gastrohepatic ligament. 5. 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 6. Cholelithiasis. Electronically Signed   By: Lajean Manes M.D.   On: 06/11/2016 15:49   Ct Abdomen Pelvis W Contrast  Result Date: 05/28/2016 CLINICAL DATA:  Abdominal pain.  Diarrhea for 1 week. EXAM: CT ABDOMEN AND PELVIS WITH CONTRAST TECHNIQUE: Multidetector CT imaging of the abdomen and pelvis was performed using the standard protocol following bolus administration of intravenous contrast. CONTRAST:  179mL ISOVUE-300 IOPAMIDOL (ISOVUE-300) INJECTION 61% COMPARISON:  CT 04/18/2016 FINDINGS: Lower chest: Small pleural effusions, lost slightly larger than on prior CT. Adjacent atelectasis in the lower lobes. Liver: Diffusely decreased density consistent with steatosis. Liver parenchyma is diffusely heterogeneous. Probable fibrosis linear bands. Nodular hepatic contours consistent with cirrhosis. Small amount of perihepatic fluid. Hepatobiliary: Multiple gallstones within physiologically distended gallbladder. Prominent common bile duct measuring 8-9 mm distally. No calcified choledocholithiasis. Pancreas: Parenchymal atrophy. No ductal dilatation or inflammation. Spleen: Normal.  No splenomegaly. Adrenal glands: No nodule. Kidneys: Symmetric renal enhancement. No hydronephrosis. Listen about delayed excretion on delayed phase imaging. Stomach/Bowel: Paraesophageal and  mild perigastric varices. Stomach physiologically distended. There are no dilated or thickened small bowel loops. Wall thickening of the cecum and proximal ascending colon without surrounding fat stranding. Stool contrast throughout the remainder the colon. The appendix is normal. Vascular/Lymphatic: Prominent porta hepatis nodes. No retroperitoneal adenopathy. Abdominal aorta is normal in caliber. Atherosclerosis of the abdominal aorta without aneurysm. Reproductive: No acute abnormality. Bladder: Minimally distended without wall thickening. Other: Small volume intra-abdominal ascites tracking in the pelvis. No free air. No intra-abdominal abscess. Mild whole body wall edema, increased from prior CT. Musculoskeletal: There are no acute or suspicious osseous abnormalities. Degenerative change in the spine, stable. IMPRESSION: 1. Colonic wall thickening involving the cecum and proximal ascending colon. This may be colitis, infectious or inflammatory, however given cirrhosis, portal enteropathy could have this appearance. 2. Advanced cirrhosis with significant fatty infiltration of the liver. Peri-gastric and paraesophageal varices. Small volume of ascites. 3. Cholelithiasis without signs of gallbladder inflammation. 4. Small pleural effusions. Electronically Signed  By: Jeb Levering M.D.   On: 05/28/2016 02:53   Dg Chest Port 1 View  Result Date: 05/30/2016 CLINICAL DATA:  Line placement EXAM: PORTABLE CHEST 1 VIEW COMPARISON:  04/28/2016 FINDINGS: Right PICC line is in place with the tip in the upper SVC. Heart is normal size. No confluent airspace opacities or effusions. No acute bony abnormality. IMPRESSION: Right PICC line tip in the upper SVC. No active disease. Electronically Signed   By: Rolm Baptise M.D.   On: 05/30/2016 11:14      ASSESSMENT AND PLAN: 1. Acute on chronic diastolic HF: Diuresed 1.8 L in past 24 hrs. Will switch IV Lasix to oral 40 mg daily. Will reduce spironolactone to 25 mg  daily.  2. Atrial fib: Elevated HR on metoprolol. BP soft. Will increase to 25 mg BID. No anticoagulation due to noncompliance, ETOH abuse. Will reduce spironolactone to 25 mg daily.  3. Hypokalemia: Repleted.   Kate Sable, M.D., F.A.C.C.

## 2016-06-15 DIAGNOSIS — I82403 Acute embolism and thrombosis of unspecified deep veins of lower extremity, bilateral: Secondary | ICD-10-CM

## 2016-06-15 LAB — BASIC METABOLIC PANEL
Anion gap: 4 — ABNORMAL LOW (ref 5–15)
BUN: 7 mg/dL (ref 6–20)
CHLORIDE: 105 mmol/L (ref 101–111)
CO2: 28 mmol/L (ref 22–32)
CREATININE: 0.88 mg/dL (ref 0.61–1.24)
Calcium: 8.3 mg/dL — ABNORMAL LOW (ref 8.9–10.3)
GFR calc Af Amer: >60 mL/min (ref >60)
GFR calc non Af Amer: >60 mL/min (ref >60)
GLUCOSE: 103 mg/dL — AB (ref 65–99)
POTASSIUM: 3.9 mmol/L (ref 3.5–5.1)
Sodium: 137 mmol/L (ref 135–145)

## 2016-06-15 LAB — MAGNESIUM: Magnesium: 1.8 mg/dL (ref 1.7–2.4)

## 2016-06-15 MED ORDER — ZOLPIDEM TARTRATE 5 MG PO TABS
5.0000 mg | ORAL_TABLET | Freq: Every evening | ORAL | Status: DC | PRN
  Administered 2016-06-15: 5 mg via ORAL
  Filled 2016-06-15: qty 1

## 2016-06-15 MED ORDER — SPIRONOLACTONE 25 MG PO TABS
12.5000 mg | ORAL_TABLET | Freq: Every day | ORAL | Status: DC
  Administered 2016-06-15 – 2016-06-16 (×2): 12.5 mg via ORAL
  Filled 2016-06-15 (×2): qty 1

## 2016-06-15 MED ORDER — METOPROLOL TARTRATE 25 MG PO TABS
25.0000 mg | ORAL_TABLET | Freq: Three times a day (TID) | ORAL | Status: DC
  Administered 2016-06-15 – 2016-06-16 (×5): 25 mg via ORAL
  Filled 2016-06-15 (×5): qty 1

## 2016-06-15 MED ORDER — ZOLPIDEM TARTRATE 5 MG PO TABS: 5.0000 mg | ORAL_TABLET | Freq: Every evening | ORAL | 0 refills | Status: DC | PRN

## 2016-06-15 MED ORDER — ZOLPIDEM TARTRATE 5 MG PO TABS
5.0000 mg | ORAL_TABLET | Freq: Once | ORAL | Status: AC
  Administered 2016-06-15: 5 mg via ORAL
  Filled 2016-06-15: qty 1

## 2016-06-15 NOTE — Care Management Important Message (Signed)
Important Message  Patient Details  Name: Nathan Frank MRN: [id number] Date of Birth: 05-17-1941   Medicare Important Message Given:  Yes    [name] 06/15/2016, 2:04 PM

## 2016-06-15 NOTE — Progress Notes (Addendum)
SUBJECTIVE:  No complaints  OBJECTIVE:   Vitals:   Vitals:   06/14/16 2100 06/15/16 0007 06/15/16 0638 06/15/16 0822  BP: 103/65 108/71 (!) 125/91 97/60  Pulse: 88 (!) 101 (!) 105 (!) 105  Resp: 20 14 17    Temp: 98.6 F (37 C) 98.6 F (37 C) 98.3 F (36.8 C) 98.4 F (36.9 C)  TempSrc: Oral Oral  Oral  SpO2: 96% 99% 100% 95%  Weight:   205 lb 11.2 oz (93.3 kg)   Height:       I&O's:   Intake/Output Summary (Last 24 hours) at 06/15/16 0945 Last data filed at 06/15/16 0600  Gross per 24 hour  Intake              700 ml  Output             2100 ml  Net            -1400 ml   TELEMETRY: Reviewed telemetry pt in atrial fibrillation:     PHYSICAL EXAM General: Well developed, well nourished, in no acute distress Head: Eyes PERRLA, No xanthomas.   Normal cephalic and atramatic  Lungs:   Clear bilaterally to auscultation and percussion. Heart:   Irregularly irregular S1 S2 Pulses are 2+ & equal. Abdomen: Bowel sounds are positive, abdomen soft and non-tender without masses Msk:  Back normal, normal gait. Normal strength and tone for age. Extremities:   No clubbing, cyanosis or edema.  DP +1 Neuro: Alert and oriented X 3. Psych:  Good affect, responds appropriately   LABS: Basic Metabolic Panel:  Recent Labs  06/14/16 0527 06/15/16 0329  NA 140 137  K 4.5 3.9  CL 104 105  CO2 28 28  GLUCOSE 97 103*  BUN 6 7  CREATININE 0.84 0.88  CALCIUM 8.1* 8.3*  MG 1.5* 1.8   Liver Function Tests:  Recent Labs  06/13/16 0449 06/14/16 0527  AST 57* 65*  ALT 19 19  ALKPHOS 64 82  BILITOT 1.3* 1.0  PROT 5.5* 5.9*  ALBUMIN 1.9* 1.8*   No results for input(s): LIPASE, AMYLASE in the last 72 hours. CBC:  Recent Labs  06/14/16 0527  WBC 4.2  HGB 12.5*  HCT 37.3*  MCV 102.8*  PLT 139*   Cardiac Enzymes: No results for input(s): CKTOTAL, CKMB, CKMBINDEX, TROPONINI in the last 72 hours. BNP: Invalid input(s): POCBNP D-Dimer: No results for input(s): DDIMER  in the last 72 hours. Hemoglobin A1C: No results for input(s): HGBA1C in the last 72 hours. Fasting Lipid Panel: No results for input(s): CHOL, HDL, LDLCALC, TRIG, CHOLHDL, LDLDIRECT in the last 72 hours. Thyroid Function Tests: No results for input(s): TSH, T4TOTAL, T3FREE, THYROIDAB in the last 72 hours.  Invalid input(s): FREET3 Anemia Panel: No results for input(s): VITAMINB12, FOLATE, FERRITIN, TIBC, IRON, RETICCTPCT in the last 72 hours. Coag Panel:   Lab Results  Component Value Date   INR 1.46 06/10/2016   INR 1.34 06/09/2016   INR 1.64 06/01/2016    RADIOLOGY: Dg Chest 2 View  Result Date: 06/09/2016 CLINICAL DATA:  Chest pain, diarrhea and shortness of breath. History of hypertension, hepatitis and cirrhosis. EXAM: CHEST  2 VIEW COMPARISON:  05/30/2016 FINDINGS: Cardiac silhouette is mildly enlarged. No mediastinal or hilar masses or evidence of adenopathy. There is lung base opacity consistent with combination of small effusions and atelectasis. Infection is possible but felt less likely. Remainder of the lungs is clear. No pneumothorax. Bony thorax is demineralized but intact. IMPRESSION:  1. Mild lung base opacity consistent with small effusions and atelectasis. Consider pneumonia if there are consistent clinical symptoms. 2. Mild cardiomegaly. No evidence of pulmonary edema. No pneumothorax. Electronically Signed   By: Lajean Manes M.D.   On: 06/09/2016 16:41   Ct Angio Chest Pe W Or Wo Contrast  Result Date: 06/09/2016 CLINICAL DATA:  Chest pain radiating to LEFT arm. Shortness of breath. History of atrial fibrillation, hypertension, alcohol abuse, hepatitis-C and hepatic cellular carcinoma. EXAM: CT ANGIOGRAPHY CHEST WITH CONTRAST TECHNIQUE: Multidetector CT imaging of the chest was performed using the standard protocol during bolus administration of intravenous contrast. Multiplanar CT image reconstructions and MIPs were obtained to evaluate the vascular anatomy. CONTRAST:  80  cc Isovue 370 COMPARISON:  Chest radiograph June 09, 2016 at 1351 hours and CT abdomen and pelvis May 28, 2016 FINDINGS: PULMONARY ARTERY: Adequate contrast opacification of the pulmonary artery's. Main pulmonary artery is enlarged, 3.7 cm in transaxial dimension. No pulmonary arterial filling defects to the level of the subsegmental branches. MEDIASTINUM: The heart is moderately enlarged. No RIGHT heart strain. No pericardial effusions. Minimal coronary artery calcifications. Sub cm cardio phrenic angle lymph nodes. No mediastinal lymphadenopathy by CT size criteria. LUNGS: Small to moderate lobulated bilateral pleural effusions. Bronchial wall thickening, predominately in the lung bases. 4 mm ground-glass nodule RIGHT lower lobe, below size followup recommendations. Compressive atelectasis. Old RIGHT posterior rib fractures. SOFT TISSUES AND OSSEOUS STRUCTURES: Multiple tiny gallstones partially imaged in the abdomen. Small amount of ascites. Nodular liver compatible with history of cirrhosis ; not tailored for evaluation of patient's known hepatic cellular carcinoma. Mild gynecomastia. Sub cm sclerotic focus LEFT posterior T6 vertebral body favoring bone island. Review of the MIP images confirms the above findings. IMPRESSION: No acute pulmonary embolism. Moderate cardiomegaly and small to moderate pleural effusions new from May 28, 2016. Bibasilar bronchial wall thickening favoring pulmonary edema. Enlarged main pulmonary artery associated with pulmonary arterial hypertension. Cirrhosis and small amount of ascites. Electronically Signed   By: Elon Alas M.D.   On: 06/09/2016 16:45   Mr Abdomen W Wo Contrast  Result Date: 05/31/2016 CLINICAL DATA:  Diarrhea, cirrhosis. EXAM: MRI ABDOMEN WITHOUT AND WITH CONTRAST TECHNIQUE: Multiplanar multisequence MR imaging of the abdomen was performed both before and after the administration of intravenous contrast. CONTRAST:  60mL MULTIHANCE GADOBENATE  DIMEGLUMINE 529 MG/ML IV SOLN COMPARISON:  CT 05/28/2016 FINDINGS: Lower chest: Small bilateral pleural effusions. Anasarca soft tissues the trunk Hepatobiliary: Marked hepatic steatosis noted on opposed phase imaging (series 8). Liver has a nodular contour and is shrunken. There is no focal hepatic lesion. Mild gallbladder wall thickening. Common bile duct is upper limits of normal at 6 mm common hepatic duct similar 7 mm no filling defect within the common bile duct (series 4). Portal veins are patent. Splenic vein is patent. Venous collaterals the epigastric region along the esophagus. No ascites. Delayed contrast imaging demonstrates linear enhancement in the liver consistent with architectural distortion of fibrosis. No discrete enhancing lesion. Pancreas: No duct dilatation.  No abnormal enhancement Spleen: Normal volume. Adrenals/urinary tract: Adrenal glands and kidneys are normal. Stomach/Bowel: Epigastric varices. Stomach limited view of the bowel is unremarkable Vascular/Lymphatic: Abdominal aortic normal caliber. No retroperitoneal periportal lymphadenopathy. Musculoskeletal: No aggressive osseous lesion IMPRESSION: 1. Mild dilatation of the common hepatic duct and common bile duct without choledocholithiasis or obstruction evident. 2. No clear stones within the gallbladder. Sludge within the gallbladder. No gallbladder distension. 3. Morphologic changes in liver consistent cirrhosis. Evidence of  portal hypertension with paraesophageal varices. No evidence hepatoma. No ascites. 4. Marked hepatic steatosis. 5. Small effusions and anasarca in the trunk. Electronically Signed   By: Suzy Bouchard M.D.   On: 05/31/2016 07:16   Ct Abdomen Pelvis W Contrast  Result Date: 06/11/2016 CLINICAL DATA:  Pt c/o persistent diarrhea, peri-umbilical pain; pt has had diarrhea x 2 weeks, without any improvement EXAM: CT ABDOMEN AND PELVIS WITH CONTRAST TECHNIQUE: Multidetector CT imaging of the abdomen and pelvis was  performed using the standard protocol following bolus administration of intravenous contrast. CONTRAST:  100 mL of Isovue-300 intravenous contrast COMPARISON:  05/28/2016 FINDINGS: Lower chest: Small, left greater than right, pleural effusions associated with dependent lower lobe atelectasis. No evidence of pneumonia or edema. Heart normal in size. Hepatobiliary: Heterogeneous liver attenuation and enhancement. Liver is decreased in overall attenuation consistent with fatty infiltration. There is surface nodularity with some central volume loss consistent with cirrhosis. No discrete liver mass. Multiple gallstones. Stable prominence of the common bile duct. No duct stones. Pancreas: Unremarkable. Spleen: Normal. Adrenals/Urinary Tract: No adrenal masses. Bilateral renal cortical thinning. Small nonobstructing stones in the lower pole of the left kidney. No renal masses. No hydronephrosis. Normal ureters. Normal bladder. Stomach/Bowel: Stomach and small bowel are unremarkable. Colon is mostly decompressed but also unremarkable. Normal appendix visualized. Vascular/Lymphatic: There are varices in the upper abdomen with dilation of coronary vein and paraesophageal varices. There are several prominent to mildly enlarged gastrohepatic ligament lymph nodes. There is anterior gastrohepatic ligament lymph node measuring 11 mm in short axis, slightly larger than on the prior CT. No other adenopathy. Reproductive: Unremarkable. Musculoskeletal: Degenerative changes noted throughout the lumbar spine. Bones are demineralized. No osteoblastic or osteolytic lesions. Other: Small amount of ascites noted adjacent to the liver and in the posterior pelvic recess. There is subcutaneous edema along the flanks. IMPRESSION: 1. No definite acute findings in the abdomen or pelvis. No inflammation or other bowel abnormality to indicate a cause for the patient's diarrhea. 2. There are changes of cirrhosis as well as hepatic steatosis.  Consider steatohepatitis if there are consistent clinical symptoms. 3. Small, left greater than right, pleural effusions. Small amount of ascites. 4. Mild, presumed reactive, adenopathy along the gastrohepatic ligament. 5. 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 6. Cholelithiasis. Electronically Signed   By: Lajean Manes M.D.   On: 06/11/2016 15:49   Ct Abdomen Pelvis W Contrast  Result Date: 05/28/2016 CLINICAL DATA:  Abdominal pain.  Diarrhea for 1 week. EXAM: CT ABDOMEN AND PELVIS WITH CONTRAST TECHNIQUE: Multidetector CT imaging of the abdomen and pelvis was performed using the standard protocol following bolus administration of intravenous contrast. CONTRAST:  116mL ISOVUE-300 IOPAMIDOL (ISOVUE-300) INJECTION 61% COMPARISON:  CT 04/18/2016 FINDINGS: Lower chest: Small pleural effusions, lost slightly larger than on prior CT. Adjacent atelectasis in the lower lobes. Liver: Diffusely decreased density consistent with steatosis. Liver parenchyma is diffusely heterogeneous. Probable fibrosis linear bands. Nodular hepatic contours consistent with cirrhosis. Small amount of perihepatic fluid. Hepatobiliary: Multiple gallstones within physiologically distended gallbladder. Prominent common bile duct measuring 8-9 mm distally. No calcified choledocholithiasis. Pancreas: Parenchymal atrophy. No ductal dilatation or inflammation. Spleen: Normal.  No splenomegaly. Adrenal glands: No nodule. Kidneys: Symmetric renal enhancement. No hydronephrosis. Listen about delayed excretion on delayed phase imaging. Stomach/Bowel: Paraesophageal and mild perigastric varices. Stomach physiologically distended. There are no dilated or thickened small bowel loops. Wall thickening of the cecum and proximal ascending colon without surrounding fat stranding. Stool contrast throughout the remainder the  colon. The appendix is normal. Vascular/Lymphatic: Prominent porta hepatis nodes. No retroperitoneal  adenopathy. Abdominal aorta is normal in caliber. Atherosclerosis of the abdominal aorta without aneurysm. Reproductive: No acute abnormality. Bladder: Minimally distended without wall thickening. Other: Small volume intra-abdominal ascites tracking in the pelvis. No free air. No intra-abdominal abscess. Mild whole body wall edema, increased from prior CT. Musculoskeletal: There are no acute or suspicious osseous abnormalities. Degenerative change in the spine, stable. IMPRESSION: 1. Colonic wall thickening involving the cecum and proximal ascending colon. This may be colitis, infectious or inflammatory, however given cirrhosis, portal enteropathy could have this appearance. 2. Advanced cirrhosis with significant fatty infiltration of the liver. Peri-gastric and paraesophageal varices. Small volume of ascites. 3. Cholelithiasis without signs of gallbladder inflammation. 4. Small pleural effusions. Electronically Signed   By: Jeb Levering M.D.   On: 05/28/2016 02:53   Dg Chest Port 1 View  Result Date: 05/30/2016 CLINICAL DATA:  Line placement EXAM: PORTABLE CHEST 1 VIEW COMPARISON:  04/28/2016 FINDINGS: Right PICC line is in place with the tip in the upper SVC. Heart is normal size. No confluent airspace opacities or effusions. No acute bony abnormality. IMPRESSION: Right PICC line tip in the upper SVC. No active disease. Electronically Signed   By: Rolm Baptise M.D.   On: 05/30/2016 11:14    ASSESSMENT AND PLAN: 1. Acute on chronic diastolic HF: Diuresed 2.1 L in past 24 hrs and is net neg 11L. Now on Lasix to oral 40 mg daily and aldactone. Weight down from 237>>205lbs.  Renal function stable. Decrease Aldactone to 12.5mg  daily to allow better BP to adjust BB for HR control.   2. Atrial fib: Elevated HR better on metoprolol but still has episodes where it increases. BP soft. Increase Lopressor to 25 mg TID. No anticoagulation due to noncompliance, ETOH abuse, HCV with cirrhosis, gastric ulcer.    3. Hypokalemia: Repleted.  Patient appears euvolemic at this time.  Repeat limited echo to make sure LVF has not changed if no change then no further recs.  Will sign off.  Please have patient followup with Dr. Harrington Challenger as outpt.  Fransico Him, MD  06/15/2016  9:45 AM

## 2016-06-15 NOTE — Progress Notes (Signed)
Physical Therapy Treatment Patient Details Name: Nathan Frank MRN: [id number] DOB: [birth year] Today's Date: 06/15/2016    History of Present Illness 75 y.o. male with medical history significant of NSTEMI 2016, a.fib w RVR, cirrhosis, depression polysubstance abuse, EtOH abuse,   hepatitis C, DVT, chronic diastolic CHF dilated cardiomyopathy Presented with Chest pain since last night radiating to left arm, ongoing diarrhea.    PT Comments    Pt tolerated increased ambulation distance of 40', HR up to 150 with walking. Instructed pt in seated BLE exercises.   Follow Up Recommendations  Home health PT;Supervision for mobility/OOB     Equipment Recommendations  None recommended by PT    Recommendations for Other Services       Precautions / Restrictions Precautions Precautions: Fall Restrictions Weight Bearing Restrictions: No    Mobility  Bed Mobility Overal bed mobility: Modified Independent Bed Mobility: Supine to Sit;Sit to Supine           General bed mobility comments: used rail, HOB up  Transfers Overall transfer level: Needs assistance Equipment used: None Transfers: Sit to/from Stand Sit to Stand: Min guard         General transfer comment: min/guard for safety  Ambulation/Gait Ambulation/Gait assistance: Min guard Ambulation Distance (Feet): 40 Feet Assistive device: None Gait Pattern/deviations: Step-through pattern;Wide base of support;Decreased step length - right;Decreased step length - left Gait velocity: decreased   General Gait Details: declined use of RW, HR up to 150 with walking, no LOB   Stairs            Wheelchair Mobility    Modified Rankin (Stroke Patients Only)       Balance     Sitting balance-Leahy Scale: Good Sitting balance - Comments: able to maintain sitting EOB unsupported for brief periods of time. Pt prefers to use 1 UE to support himself.     Standing balance-Leahy Scale: Good                       Cognition Arousal/Alertness: Awake/alert Behavior During Therapy: WFL for tasks assessed/performed Overall Cognitive Status: Within Functional Limits for tasks assessed                      Exercises General Exercises - Lower Extremity Ankle Circles/Pumps: AROM;Both;10 reps;Seated  Instructed pt in long arc quads and seated marching. Pt stated he'd do them after leaking condom catheter is fixed. Nurse tech notified of leak.     General Comments        Pertinent Vitals/Pain Pain Assessment: No/denies pain    Home Living                      Prior Function            PT Goals (current goals can now be found in the care plan section) Acute Rehab PT Goals Patient Stated Goal: To go home PT Goal Formulation: With patient Time For Goal Achievement: 06/24/16 Potential to Achieve Goals: Good Progress towards PT goals: Progressing toward goals    Frequency  Min 3X/week    PT Plan Current plan remains appropriate    Co-evaluation             End of Session Equipment Utilized During Treatment: Gait belt Activity Tolerance: Treatment limited secondary to medical complications (Comment) (increased HR with walking) Patient left: with call bell/phone within reach;in bed     Time: [id number] PT Time Calculation (  min) (ACUTE ONLY): 14 min  Charges:  $Gait Training: 8-22 mins                    G Codes:      Blondell Reveal Kistler 06/15/2016, 2:00 PM [phone]

## 2016-06-15 NOTE — Progress Notes (Signed)
Occupational Therapy Treatment/Discharge Patient Details Name: Nathan Frank MRN: [id number] DOB: [birth year] Today's Date: 06/15/2016    History of present illness 75 y.o. male with medical history significant of NSTEMI 2016, a.fib w RVR, cirrhosis, depression polysubstance abuse, EtOH abuse,   hepatitis C, DVT, chronic diastolic CHF dilated cardiomyopathy Presented with Chest pain since last night radiating to left arm, ongoing diarrhea.   OT comments  Pt progressing well and has achieved all acute OT goals. Pt able to complete LB dressing and bathing using AE that has been provided to him. Pt declined to mobilize OOB, but therapist demonstrated positioning and use of 3in1 as a shower seat in his tub and also provided a handout with pictures. Pt is adequate for d/c from OT standpoint. Continue to recommend [place] (and [place] RN possibly) to help pt establish a medication management routine and for ADL retraining. Pt with no further acute OT needs. OT signing off.    Follow Up Recommendations  Home health OT;Supervision/Assistance - 24 hour    Equipment Recommendations  3 in 1 bedside comode    Recommendations for Other Services      Precautions / Restrictions Precautions Precautions: Fall Restrictions Weight Bearing Restrictions: No       Mobility Bed Mobility Overal bed mobility: Modified Independent Bed Mobility: Supine to Sit;Sit to Supine           General bed mobility comments: Increased time and effort. HOB flat, no use of bedrails.  Transfers                 General transfer comment: Pt declined to mobilize past sitting EOB.    Balance Overall balance assessment: Needs assistance Sitting-balance support: No upper extremity supported;Feet supported Sitting balance-Leahy Scale: Good Sitting balance - Comments: able to maintain sitting EOB unsupported for brief periods of time. Pt prefers to use 1 UE to support himself.                           ADL  Overall ADL's : Needs assistance/impaired         Upper Body Bathing: Supervision/ safety;Sitting   Lower Body Bathing: Supervison/ safety;Sit to/from stand;With adaptive equipment   Upper Body Dressing : Supervision/safety;Sitting   Lower Body Dressing: Supervision/safety;With adaptive equipment;Sitting/lateral leans Lower Body Dressing Details (indicate cue type and reason): cues for optimal use of reacher and sock aid           Tub/Shower Transfer Details (indicate cue type and reason): Pt declined to mobilize OOB to practice tub transfer with 3in1. Explained, demonstrated, and provided handout explaining 3in1 positioning in tub and safe use.          Vision                     Perception     Praxis      Cognition   Behavior During Therapy: Flat affect Overall Cognitive Status: Within Functional Limits for tasks assessed                       Extremity/Trunk Assessment               Exercises     Shoulder Instructions       General Comments      Pertinent Vitals/ Pain       Pain Assessment: No/denies pain  Home Living  Prior Functioning/Environment              Frequency       Progress Toward Goals  OT Goals(current goals can now be found in the care plan section)  Progress towards OT goals: Progressing toward goals  Acute Rehab OT Goals Patient Stated Goal: To go home OT Goal Formulation: With patient Time For Goal Achievement: 06/24/16 Potential to Achieve Goals: Good ADL Goals Pt Will Perform Grooming: with supervision;standing Pt Will Perform Upper Body Bathing: with set-up;sitting Pt Will Perform Lower Body Bathing: with min assist;sit to/from stand Pt Will Perform Upper Body Dressing: with set-up;sitting Pt Will Perform Lower Body Dressing: with min assist;sit to/from stand Pt Will Transfer to Toilet: with supervision;ambulating;bedside commode Pt  Will Perform Toileting - Clothing Manipulation and hygiene: sit to/from stand;with min assist  Plan All goals met and education completed, patient discharged from OT services    Co-evaluation                 End of Session     Activity Tolerance Patient tolerated treatment well   Patient Left in bed;with call bell/phone within reach;with bed alarm set   Nurse Communication Mobility status        Time: 6429-0379 OT Time Calculation (min): 13 min  Charges: OT General Charges $OT Visit: 1 Procedure OT Treatments $Self Care/Home Management : 8-22 mins  Redmond Baseman, OTR/L Pager: [phone] 06/15/2016, 10:32 AM

## 2016-06-15 NOTE — Progress Notes (Signed)
PROGRESS NOTE  Nathan Frank [id number] DOB: 10-28-1940 DOA: 06/09/2016 PCP: Thressa Sheller, MD  HPI/Recap of past 24 hours:  Report feeling better,  2.1 liter urine output last 24hrs Report 2bm this am, # of bm not accurately documented   Assessment/Plan: Active Problems:   Essential hypertension   Atrial fibrillation with RVR (HCC)   Chest pain   Chronic atrial fibrillation (HCC)   Hypokalemia   Alcoholic cirrhosis of liver with ascites (HCC)   Hypomagnesemia   Acute on chronic diastolic CHF (congestive heart failure) (HCC)   Chest pain: h/o NSTEMI currently chest pain free, troponin mild and flat, ekg afib/chronic RBBB, QTC prolongation, no acute changes, on asa/betablocker, ldl 36, not a candidate for statin with chronic liver issues. Cardiology following  Afib/rvr: briefly on cardizem drip initially on presentation, now on betablocker, rate controlled, CHADSvasc score 3 with age 8, h/o chf, he does has h/o gi bleed and h/o alcohol and substance abuse, has not been on anticoagulation, may not be a good candidate for amiodarone due to chronic liver disease,  meds adjustment per cardiology, currently on increased dose of lopressor and asa 325.  Acute on chronic diastolic chf exacerbation: edema improving, on lasix/spironolactone, dose adjustment per cardiology, patient also has low albumin Appreciate cardiology input   DVT:  not on anticoagulation due to noncompliance and gi bleed, patient denies h/o DVT Per venous doppler done in 2013, he had dvts in right lower extremity Venous doppler done this hospitalization : + dvt's RLE and LLE, awaiting final report, if acute dvt , will get ivf filter, I have discuss the case with interventional radiology Dr Levan Hurst on 9/11,    Hypok/hypomag: replace k/mag, avoid oral mag due to c/o diarrhea  Chronic diarrhea:  recent stool study negative for cdiff and negative GI prc panel, started pancreatic enzyme supplement   Due to  c/o abdominal pain, CT Ab /pel no acute findings, does has cholilithiasis, lft wnl, no n/v.  repeat stool gi prc panel negative, # of bm not accurately documented, consider questran if diarrhea significant, he has been on sucralfate which should slow down diarrhea as well. continue outpatient GI follow up  Hepc/cirrhosis/?hcc: elevated ammonia level , will need to have bm 3-4 times a day, need outpatient GI/ID followup, patient expressed understanding  Hypothyroidism: continue synthroid, tsh significantly elevated, need education for meds adherence    Medication noncompliance/alcohol and polysubstance abuse per chart review h/o heroin use as well  Deconditioning: patient report no driving, does not walk, he stay in bed and watch TV all day for the last 1-2 months, he report significant dyspnea when he walks a few steps, patient report lives with wife who has early dementia, will need home health  Code Status: full  Family Communication: patient and his daughter at bedside on 9/11  Disposition Plan: remain in the hospital a few days, may need IVC filter eventually home with home health, need cardiology clearance   Consultants:  Cardiology  IR (phone conversation with IR Dr Bobetta Lime)  Procedures:  none  Antibiotics:  none   Objective: BP 97/60 (BP Location: Left Arm)   Pulse (!) 105   Temp 98.4 F (36.9 C) (Oral)   Resp 17   Ht 6' (1.829 m)   Wt 93.3 kg (205 lb 11.2 oz)   SpO2 95%   BMI 27.90 kg/m   Intake/Output Summary (Last 24 hours) at 06/15/16 0907 Last data filed at 06/15/16 0600  Gross per 24 hour  Intake  700 ml  Output             2100 ml  Net            -1400 ml   Filed Weights   06/13/16 0500 06/14/16 0500 06/15/16 0638  Weight: 95.2 kg (209 lb 12.8 oz) 94.2 kg (207 lb 9.6 oz) 93.3 kg (205 lb 11.2 oz)    Exam:   General:  Weak but NAD  Cardiovascular: IRRR  Respiratory: crackles at baseline has decreased  Abdomen: Soft/ND/NT,  positive BS  Musculoskeletal: diffuse pitting  Edema has improved  Neuro: aaox3  Data Reviewed: Basic Metabolic Panel:  Recent Labs Lab 06/10/16 0758 06/11/16 0235 06/12/16 0659 06/13/16 0449 06/14/16 0527 06/15/16 0329  NA 139 141 137 140 140 137  K 3.1* 3.6 3.1* 2.9* 4.5 3.9  CL 107 106 103 105 104 105  CO2 26 28 27 30 28 28   GLUCOSE 90 94 86 90 97 103*  BUN <5* <5* <5* <5* 6 7  CREATININE 0.84 0.76 0.79 0.74 0.84 0.88  CALCIUM 7.4* 7.7* 7.8* 7.9* 8.1* 8.3*  MG 1.6* 1.5* 1.6* 1.6* 1.5* 1.8  PHOS 3.8  --   --   --   --   --    Liver Function Tests:  Recent Labs Lab 06/09/16 1500 06/10/16 0758 06/12/16 0659 06/13/16 0449 06/14/16 0527  AST 69* 55* 59* 57* 65*  ALT 26 21 19 19 19   ALKPHOS 67 62 70 64 82  BILITOT 1.7* 1.5* 1.3* 1.3* 1.0  PROT 5.9* 4.9* 5.6* 5.5* 5.9*  ALBUMIN 2.1* 1.7* 1.9* 1.9* 1.8*    Recent Labs Lab 06/09/16 1500  LIPASE 23    Recent Labs Lab 06/14/16 0527  AMMONIA 57*   CBC:  Recent Labs Lab 06/09/16 1500 06/09/16 1521 06/10/16 0758 06/11/16 0235 06/14/16 0527  WBC 4.0  --  3.8* 3.6* 4.2  NEUTROABS 2.5  --   --   --   --   HGB 12.2* 13.6 10.6* 11.8* 12.5*  HCT 36.8* 40.0 31.8* 35.9* 37.3*  MCV 101.9*  --  101.3* 102.3* 102.8*  PLT 144*  --  124* 132* 139*   Cardiac Enzymes:    Recent Labs Lab 06/09/16 2253 06/10/16 0758 06/10/16 1044  TROPONINI 0.03* 0.03* 0.03*   BNP (last 3 results)  Recent Labs  04/18/16 1855 04/29/16 0305 06/09/16 1500  BNP 375.8* 413.5* 463.2*    ProBNP (last 3 results) No results for input(s): PROBNP in the last 8760 hours.  CBG: No results for input(s): GLUCAP in the last 168 hours.  Recent Results (from the past 240 hour(s))  Urine culture     Status: Abnormal   Collection Time: 06/09/16  8:58 PM  Result Value Ref Range Status   Specimen Description URINE, CLEAN CATCH  Final   Special Requests NONE  Final   Culture <10,000 COLONIES/mL INSIGNIFICANT GROWTH (A)  Final    Report Status 06/11/2016 FINAL  Final  MRSA PCR Screening     Status: None   Collection Time: 06/09/16 10:30 PM  Result Value Ref Range Status   MRSA by PCR NEGATIVE NEGATIVE Final    Comment:        The GeneXpert MRSA Assay (FDA approved for NASAL specimens only), is one component of a comprehensive MRSA colonization surveillance program. It is not intended to diagnose MRSA infection nor to guide or monitor treatment for MRSA infections.   Gastrointestinal Panel by PCR , Stool     Status:  None   Collection Time: 06/11/16 11:00 AM  Result Value Ref Range Status   Campylobacter species NOT DETECTED NOT DETECTED Final   Plesimonas shigelloides NOT DETECTED NOT DETECTED Final   Salmonella species NOT DETECTED NOT DETECTED Final   Yersinia enterocolitica NOT DETECTED NOT DETECTED Final   Vibrio species NOT DETECTED NOT DETECTED Final   Vibrio cholerae NOT DETECTED NOT DETECTED Final   Enteroaggregative E coli (EAEC) NOT DETECTED NOT DETECTED Final   Enteropathogenic E coli (EPEC) NOT DETECTED NOT DETECTED Final   Enterotoxigenic E coli (ETEC) NOT DETECTED NOT DETECTED Final   Shiga like toxin producing E coli (STEC) NOT DETECTED NOT DETECTED Final   Shigella/Enteroinvasive E coli (EIEC) NOT DETECTED NOT DETECTED Final   Cryptosporidium NOT DETECTED NOT DETECTED Final   Cyclospora cayetanensis NOT DETECTED NOT DETECTED Final   Entamoeba histolytica NOT DETECTED NOT DETECTED Final   Giardia lamblia NOT DETECTED NOT DETECTED Final   Adenovirus F40/41 NOT DETECTED NOT DETECTED Final   Astrovirus NOT DETECTED NOT DETECTED Final   Norovirus GI/GII NOT DETECTED NOT DETECTED Final   Rotavirus A NOT DETECTED NOT DETECTED Final   Sapovirus (I, II, IV, and V) NOT DETECTED NOT DETECTED Final     Studies: No results found.  Scheduled Meds: . aspirin  324 mg Oral Once  . enoxaparin (LOVENOX) injection  40 mg Subcutaneous Daily  . folic acid  1 mg Oral Daily  . furosemide  40 mg Oral  Daily  . guaiFENesin  600 mg Oral BID  . levothyroxine  50 mcg Oral QAC breakfast  . lipase/protease/amylase  12,000 Units Oral TID WC  . metoprolol tartrate  25 mg Oral BID  . potassium chloride  40 mEq Oral Daily  . saccharomyces boulardii  250 mg Oral BID  . sodium chloride flush  3 mL Intravenous Q12H  . spironolactone  25 mg Oral Daily  . sucralfate  1 g Oral TID WC & HS  . thiamine  100 mg Oral Daily    Continuous Infusions:    Time spent: 2mins  Jesica Goheen MD, PhD  [facility] Pager [phone]. If 7PM-7AM, please contact night-coverage at [email or web address], password [facility] 06/15/2016, 9:07 AM  LOS: 6 days

## 2016-06-16 ENCOUNTER — Inpatient Hospital Stay (HOSPITAL_COMMUNITY): Payer: Medicare Other

## 2016-06-16 DIAGNOSIS — B1920 Unspecified viral hepatitis C without hepatic coma: Secondary | ICD-10-CM

## 2016-06-16 DIAGNOSIS — I509 Heart failure, unspecified: Secondary | ICD-10-CM

## 2016-06-16 DIAGNOSIS — R7989 Other specified abnormal findings of blood chemistry: Secondary | ICD-10-CM

## 2016-06-16 LAB — BASIC METABOLIC PANEL
ANION GAP: 7 (ref 5–15)
BUN: 9 mg/dL (ref 6–20)
CALCIUM: 8.5 mg/dL — AB (ref 8.9–10.3)
CHLORIDE: 108 mmol/L (ref 101–111)
CO2: 25 mmol/L (ref 22–32)
CREATININE: 0.85 mg/dL (ref 0.61–1.24)
GFR calc non Af Amer: >60 mL/min (ref >60)
Glucose, Bld: 95 mg/dL (ref 65–99)
Potassium: 4.2 mmol/L (ref 3.5–5.1)
SODIUM: 140 mmol/L (ref 135–145)

## 2016-06-16 LAB — ECHOCARDIOGRAM LIMITED
Height: 72 in
WEIGHTICAEL: 3267.2 [oz_av]

## 2016-06-16 LAB — AMMONIA: Ammonia: 44 umol/L — ABNORMAL HIGH (ref 9–35)

## 2016-06-16 LAB — MAGNESIUM: Magnesium: 1.6 mg/dL — ABNORMAL LOW (ref 1.7–2.4)

## 2016-06-16 MED ORDER — PERFLUTREN LIPID MICROSPHERE
INTRAVENOUS | Status: AC
  Filled 2016-06-16: qty 10

## 2016-06-16 MED ORDER — PERFLUTREN LIPID MICROSPHERE
1.0000 mL | INTRAVENOUS | Status: AC | PRN
  Administered 2016-06-16: 2 mL via INTRAVENOUS
  Filled 2016-06-16: qty 10

## 2016-06-16 MED ORDER — LEVOTHYROXINE SODIUM 50 MCG PO TABS: 50.0000 ug | ORAL_TABLET | Freq: Every day | ORAL | 0 refills | Status: DC

## 2016-06-16 MED ORDER — METOPROLOL TARTRATE 25 MG PO TABS: 25.0000 mg | ORAL_TABLET | Freq: Three times a day (TID) | ORAL | 0 refills | Status: DC

## 2016-06-16 MED ORDER — SPIRONOLACTONE 25 MG PO TABS: 12.5000 mg | ORAL_TABLET | Freq: Every day | ORAL | 0 refills | Status: DC

## 2016-06-16 MED ORDER — FUROSEMIDE 40 MG PO TABS: 40.0000 mg | ORAL_TABLET | Freq: Every day | ORAL | 0 refills | Status: DC

## 2016-06-16 MED ORDER — ASPIRIN EC 325 MG PO TBEC: 325.0000 mg | DELAYED_RELEASE_TABLET | Freq: Every day | ORAL | 0 refills | Status: DC

## 2016-06-16 MED ORDER — MAGNESIUM SULFATE 4 GM/100ML IV SOLN
4.0000 g | Freq: Once | INTRAVENOUS | Status: AC
  Administered 2016-06-16: 4 g via INTRAVENOUS
  Filled 2016-06-16: qty 100

## 2016-06-16 MED ORDER — POTASSIUM CHLORIDE ER 10 MEQ PO TBCR: 20.0000 meq | EXTENDED_RELEASE_TABLET | Freq: Every day | ORAL | 0 refills | Status: DC

## 2016-06-16 MED ORDER — SUCRALFATE 1 G PO TABS: 1.0000 g | ORAL_TABLET | Freq: Two times a day (BID) | ORAL | 0 refills | Status: DC

## 2016-06-16 MED ORDER — PANCRELIPASE (LIP-PROT-AMYL) 12000-38000 UNITS PO CPEP: 12000.0000 [IU] | ORAL_CAPSULE | Freq: Three times a day (TID) | ORAL | 0 refills | Status: DC

## 2016-06-16 NOTE — Progress Notes (Signed)
  Echocardiogram 2D Echocardiogram with Definity has been performed.  Nathan Frank 06/16/2016, 10:45 AM

## 2016-06-16 NOTE — Progress Notes (Signed)
Patient Name: Nathan Frank Date of Encounter: 06/16/2016  Active Problems:   Essential hypertension   Atrial fibrillation with RVR (HCC)   Chest pain   Chronic atrial fibrillation (HCC)   Hypokalemia   Alcoholic cirrhosis of liver with ascites (HCC)   Hypomagnesemia   Acute on chronic diastolic CHF (congestive heart failure) [facility])   Primary Cardiologist: Dr Harrington Challenger  Patient Profile:  75 y.o.year old malewith a history of atrial fibrillation not on anticoagulation due to hx of GI bleeding, CHA2DS2 - VASc is 63 (age x 1, HTN,CHF), RBBB, SVT,liver cirrhosis, HCV, alcohol abuse, heroin abuse, marijuana abuse,DVT, gastric ulcer, medication noncompliance.  SUBJECTIVE: Pt says no SOB and no chest pain, no calf tenderness. Tachycardic at times  OBJECTIVE Vitals:   06/15/16 2206 06/15/16 2339 06/16/16 0356 06/16/16 0451  BP: 103/76 106/67 102/72   Pulse: 94 (!) 120 (!) 117   Resp:  20 13   Temp:  98.6 F (37 C) 98.5 F (36.9 C)   TempSrc:  Oral Oral   SpO2:  98% 99%   Weight:    204 lb 3.2 oz (92.6 kg)  Height:        Intake/Output Summary (Last 24 hours) at 06/16/16 1226 Last data filed at 06/16/16 0451  Gross per 24 hour  Intake                0 ml  Output              800 ml  Net             -800 ml   Filed Weights   06/14/16 0500 06/15/16 0638 06/16/16 0451  Weight: 207 lb 9.6 oz (94.2 kg) 205 lb 11.2 oz (93.3 kg) 204 lb 3.2 oz (92.6 kg)    PHYSICAL EXAM General: Well developed, well nourished, male in no acute distress. Head: Normocephalic, atraumatic.  Neck: Supple without bruits, JVD mildly elevated Lungs:  Resp regular and unlabored, clear bilaterally. Heart: Irreg R&R, S1, S2, no S3, S4, or murmur; no rub. Abdomen: Soft, non-tender, non-distended, BS + x 4.  Extremities: No clubbing, cyanosis, edema.  Neuro: Alert and oriented X 3. Moves all extremities spontaneously. Psych: Normal affect.  LABS: CBC: Recent Labs  06/14/16 0527  WBC 4.2  HGB  12.5*  HCT 37.3*  MCV 102.8*  PLT [id number]*   Basic Metabolic Panel: Recent Labs  06/14/16 0527 06/15/16 0329  NA 140 137  K 4.5 3.9  CL 104 105  CO2 28 28  GLUCOSE 97 103*  BUN 6 7  CREATININE 0.84 0.88  CALCIUM 8.1* 8.3*  MG 1.5* 1.8   Liver Function Tests: Recent Labs  06/14/16 0527  AST 65*  ALT 19  ALKPHOS 82  BILITOT 1.0  PROT 5.9*  ALBUMIN 1.8*   BNP:  B Natriuretic Peptide  Date/Time Value Ref Range Status  06/09/2016 03:00 PM 463.2 (H) 0.0 - 100.0 pg/mL Final  04/29/2016 03:05 AM 413.5 (H) 0.0 - 100.0 pg/mL Final    TELE:  Atrial fib, RVR at times, no sig ectopy      ECHO: 09/12  - Left ventricle: Posterior , septal and apical hypokinesis The   cavity size was mildly dilated. Wall thickness was normal.   Systolic function was mildly to moderately reduced. The estimated   ejection fraction was in the range of 40% to 45%. Left   ventricular diastolic function parameters were normal. - Mitral valve: There was mild regurgitation. -  Atrial septum: No defect or patent foramen ovale was identified.  12/2014 - Left ventricle: The cavity size was normal. Wall thickness was normal. The estimated ejection fraction was 50%. Diffuse hypokinesis. Features are consistent with a pseudonormal left ventricular filling pattern, with concomitant abnormal relaxation and increased filling pressure (grade 2 diastolic dysfunction). - Aortic valve: There was no stenosis. - Mitral valve: There was trivial regurgitation. - Left atrium: The atrium was mildly to moderately dilated. - Right ventricle: The cavity size was normal. Systolic function was normal. - Tricuspid valve: Peak RV-RA gradient (S): 32 mm Hg. - Pulmonary arteries: PA peak pressure: 35 mm Hg (S). - Inferior vena cava: The vessel was normal in size. The respirophasic diameter changes were in the normal range (>= 50%), consistent with normal central venous pressure. Impressions: - Technically  difficult study with poor acoustic windows. Normal LV size with EF 50%, diffuse mild hypokinesis. Moderate diastolic dysfunction. Normal RV size and systolic function. No significant valvular abnormalities.   Radiology/Studies: Dg Chest 2 View Result Date: 06/09/2016 CLINICAL DATA:  Chest pain, diarrhea and shortness of breath. History of hypertension, hepatitis and cirrhosis. EXAM: CHEST  2 VIEW COMPARISON:  05/30/2016 FINDINGS: Cardiac silhouette is mildly enlarged. No mediastinal or hilar masses or evidence of adenopathy. There is lung base opacity consistent with combination of small effusions and atelectasis. Infection is possible but felt less likely. Remainder of the lungs is clear. No pneumothorax. Bony thorax is demineralized but intact. IMPRESSION: 1. Mild lung base opacity consistent with small effusions and atelectasis. Consider pneumonia if there are consistent clinical symptoms. 2. Mild cardiomegaly. No evidence of pulmonary edema. No pneumothorax. Electronically Signed   By: Lajean Manes M.D.   On: 06/09/2016 16:41   Ct Angio Chest Pe W Or Wo Contrast Result Date: 06/09/2016 CLINICAL DATA:  Chest pain radiating to LEFT arm. Shortness of breath. History of atrial fibrillation, hypertension, alcohol abuse, hepatitis-C and hepatic cellular carcinoma. EXAM: CT ANGIOGRAPHY CHEST WITH CONTRAST TECHNIQUE: Multidetector CT imaging of the chest was performed using the standard protocol during bolus administration of intravenous contrast. Multiplanar CT image reconstructions and MIPs were obtained to evaluate the vascular anatomy. CONTRAST:  80 cc Isovue 370 COMPARISON:  Chest radiograph June 09, 2016 at 1351 hours and CT abdomen and pelvis May 28, 2016 FINDINGS: PULMONARY ARTERY: Adequate contrast opacification of the pulmonary artery's. Main pulmonary artery is enlarged, 3.7 cm in transaxial dimension. No pulmonary arterial filling defects to the level of the subsegmental branches.  MEDIASTINUM: The heart is moderately enlarged. No RIGHT heart strain. No pericardial effusions. Minimal coronary artery calcifications. Sub cm cardio phrenic angle lymph nodes. No mediastinal lymphadenopathy by CT size criteria. LUNGS: Small to moderate lobulated bilateral pleural effusions. Bronchial wall thickening, predominately in the lung bases. 4 mm ground-glass nodule RIGHT lower lobe, below size followup recommendations. Compressive atelectasis. Old RIGHT posterior rib fractures. SOFT TISSUES AND OSSEOUS STRUCTURES: Multiple tiny gallstones partially imaged in the abdomen. Small amount of ascites. Nodular liver compatible with history of cirrhosis ; not tailored for evaluation of patient's known hepatic cellular carcinoma. Mild gynecomastia. Sub cm sclerotic focus LEFT posterior T6 vertebral body favoring bone island. Review of the MIP images confirms the above findings. IMPRESSION: No acute pulmonary embolism. Moderate cardiomegaly and small to moderate pleural effusions new from May 28, 2016. Bibasilar bronchial wall thickening favoring pulmonary edema. Enlarged main pulmonary artery associated with pulmonary arterial hypertension. Cirrhosis and small amount of ascites. Electronically Signed   By: Thana Farr.D.  On: 06/09/2016 16:45   Ct Abdomen Pelvis W Contrast Result Date: 06/11/2016 CLINICAL DATA:  Pt c/o persistent diarrhea, peri-umbilical pain; pt has had diarrhea x 2 weeks, without any improvement EXAM: CT ABDOMEN AND PELVIS WITH CONTRAST TECHNIQUE: Multidetector CT imaging of the abdomen and pelvis was performed using the standard protocol following bolus administration of intravenous contrast. CONTRAST:  100 mL of Isovue-300 intravenous contrast COMPARISON:  05/28/2016 FINDINGS: Lower chest: Small, left greater than right, pleural effusions associated with dependent lower lobe atelectasis. No evidence of pneumonia or edema. Heart normal in size. Hepatobiliary: Heterogeneous liver  attenuation and enhancement. Liver is decreased in overall attenuation consistent with fatty infiltration. There is surface nodularity with some central volume loss consistent with cirrhosis. No discrete liver mass. Multiple gallstones. Stable prominence of the common bile duct. No duct stones. Pancreas: Unremarkable. Spleen: Normal. Adrenals/Urinary Tract: No adrenal masses. Bilateral renal cortical thinning. Small nonobstructing stones in the lower pole of the left kidney. No renal masses. No hydronephrosis. Normal ureters. Normal bladder. Stomach/Bowel: Stomach and small bowel are unremarkable. Colon is mostly decompressed but also unremarkable. Normal appendix visualized. Vascular/Lymphatic: There are varices in the upper abdomen with dilation of coronary vein and paraesophageal varices. There are several prominent to mildly enlarged gastrohepatic ligament lymph nodes. There is anterior gastrohepatic ligament lymph node measuring 11 mm in short axis, slightly larger than on the prior CT. No other adenopathy. Reproductive: Unremarkable. Musculoskeletal: Degenerative changes noted throughout the lumbar spine. Bones are demineralized. No osteoblastic or osteolytic lesions. Other: Small amount of ascites noted adjacent to the liver and in the posterior pelvic recess. There is subcutaneous edema along the flanks. IMPRESSION: 1. No definite acute findings in the abdomen or pelvis. No inflammation or other bowel abnormality to indicate a cause for the patient's diarrhea. 2. There are changes of cirrhosis as well as hepatic steatosis. Consider steatohepatitis if there are consistent clinical symptoms. 3. Small, left greater than right, pleural effusions. Small amount of ascites. 4. Mild, presumed reactive, adenopathy along the gastrohepatic ligament. 5. 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 6. Cholelithiasis. Electronically Signed   By: Lajean Manes M.D.   On: 06/11/2016 15:49     Current Medications:  . aspirin  324 mg Oral Once  . enoxaparin (LOVENOX) injection  40 mg Subcutaneous Daily  . folic acid  1 mg Oral Daily  . furosemide  40 mg Oral Daily  . guaiFENesin  600 mg Oral BID  . levothyroxine  50 mcg Oral QAC breakfast  . lipase/protease/amylase  12,000 Units Oral TID WC  . metoprolol tartrate  25 mg Oral TID  . potassium chloride  40 mEq Oral Daily  . saccharomyces boulardii  250 mg Oral BID  . sodium chloride flush  3 mL Intravenous Q12H  . spironolactone  12.5 mg Oral Daily  . sucralfate  1 g Oral TID WC & HS  . thiamine  100 mg Oral Daily      ASSESSMENT AND PLAN: 1. Acute on chronic diastolic HF: - MD to review echo, EF prev 50% (poor images), now 40-45% - wt down 30 lbs and Diuresed 12.0L since admit - Now on Lasix to oral 40 mg daily and aldactone. Weight down from 237>>204lbs. - Renal function stable. Decreased Aldactone to 12.5mg  daily to allow better BP to adjust BB for HR control.   2. Atrial fib: Elevated HR betteron metoprolol but still has episodes where it increases. BP soft. - Increased Lopressor to 25 mg TID, can change  to 37.5 mg bid at d/c. - No anticoagulation due to noncompliance, ETOH abuse, HCV with cirrhosis, gastric ulcer.   3. Hypokalemia: Repleted.  Otherwise, per IM, will send message to get f/u appt. Active Problems:   Essential hypertension   Atrial fibrillation with RVR (HCC)   Chest pain   Chronic atrial fibrillation (HCC)   Hypokalemia   Alcoholic cirrhosis of liver with ascites (HCC)   Hypomagnesemia   Acute on chronic diastolic CHF (congestive heart failure) ([place])   Signed, Rosaria Ferries , PA-C 12:26 PM 06/16/2016

## 2016-06-16 NOTE — Progress Notes (Signed)
Pt has received AVS summary and all questions have been answered. IV has been removed and monitor dc'd. Unable to reach pt's wife at this time to notify that pt has been discharged. Pt insists that he goes home and that he feels certain that his wife will be there at this arrival. Pt will be discharged by cab from facility.

## 2016-06-16 NOTE — Clinical Social Work Note (Signed)
CSW attempted to complete assessment with patient regarding his alcohol use, but the patient would not engage with CSW on this topic. The patient states he is going home today and wants to be left alone. CSW signing off at this time.  Liz Beach MSW, [place], [place], [id number]

## 2016-06-16 NOTE — Discharge Summary (Signed)
Discharge Summary  Nathan Frank [id number] DOB: 11/01/1940  PCP: Thressa Sheller, MD  Admit date: 06/09/2016 Discharge date: 06/16/2016  Time spent: >77mins  Recommendations for Outpatient Follow-up:  1. F/u with PMD within a week  for hospital discharge follow up, repeat cbc/bmp at follow up, repeat tsh in 4-6 weeks to monitor thyroid function. pmd to arrange repeat lower extremity venous doppler to access clot burden. place ivf filter if clot increase in size. 2. F/u with cardiology Dr Harrington Challenger for afib and chf, he has an appointment with cardiology on 9/26. 3. F/u with infectious disease for hep C 4. F/u with Gi for cirrhosis and chronic diarrhea  Discharge Diagnoses:  Active Hospital Problems   Diagnosis Date Noted  . Hypomagnesemia 06/09/2016  . Acute on chronic diastolic CHF (congestive heart failure) ([place]) 06/09/2016  . Alcoholic cirrhosis of liver with ascites ([place])   . Hypokalemia 05/27/2016  . Chest pain 04/18/2016  . Chronic atrial fibrillation ([place]) 04/18/2016  . Atrial fibrillation with RVR ([facility]) 04/18/2016  . Essential hypertension     Resolved Hospital Problems   Diagnosis Date Noted Date Resolved  No resolved problems to display.    Discharge Condition: stable  Diet recommendation: heart healthy  Filed Weights   06/14/16 0500 06/15/16 [id number] 06/16/16 0451  Weight: 94.2 kg (207 lb 9.6 oz) 93.3 kg (205 lb 11.2 oz) 92.6 kg (204 lb 3.2 oz)    History of present illness:  Chief Complaint: chest pain  HPI: Nathan Frank is a 75 y.o. male with medical history significant of NSTEMI 2016, a.fib w RVR, cirrhosis, depression polysubstance abuse, EtOH abuse,   hepatitis C, DVT, chronic diastolic CHF dilated cardiomyopathy  Presented with Chest pain since last night radiating to left arm  Patient continues to have ongoing diarrhea 7 bowel movements today  Denies drinking any more but states have not taken any of his medications since last week. Chest pain is  ongoing currently 7/10 reports some chills. He has very limited mobility reports unable to walk up until today. Reports generalized swelling and abdominal discomfort unchanged from prior . Denies feeling light headed when he sits up.  No associated nausea vomiting fevers  shortness of breath or abdominal complaints  he may have had  diarrhea for past few weeks associated generalized fatigue he is being admitted for this earlier in the and of August no travel sick contacts patient has not been complaining his medications. During his admission C. difficile was negative GI pathogen was negative CT abdomen and pelvis showed liver cirrhosis with thickening of cecum and ascending colon he status post antibiotic treatment. 8/27 Abdominal MRI not showing any lesions concerning for cancer He was charged home with probiotics. Patient was diagnosed with hepatitis C in Dr. Johnnye Sima has been consulted plan to follow up as an outpatient. Patient had had troubles in the past with hypokalemia and hypomagnesemia which required replacement supposed to be on daily magnesium supplementation Re: History of Paroxysmal A. Fib -CHA2DS2 - VASc is 4 - not on anticoagulants secondary to history of GI bleed and alcohol abuse she has not been compliant with his medication  IN ER:  Temp (24hrs), Avg:98.6 F (37 C), Min:98.6 F (37 C), Max:98.6 F (37 C)  Initially A. fib with RVR heart rate up to 120s to 170s refused aspirin or IV acces initially  still reporting pain 7 out of 10  Magnesium 1.3 potassium 3.3 of 2.1 creatinine 0.87 BNP 463 which is up from prior Dimer 2.97  T scan showing no PE but moderate cardiomegaly with small to moderate pleural effusions and pulmonary edema evidence of pulmonary arterial hypertension cirrhosis and small amount of ascites Following Medications were ordered in ER:    Hospitalist was called for admission for Chest Pain    Hospital Course:  Active Problems:   Essential hypertension    Atrial fibrillation with RVR (H[facility])   Chest pain   Chronic atrial fibrillation (H[facility])   Hypokalemia   Alcoholic cirrhosis of liver with ascites (H[facility])   Hypomagnesemia   Acute on chronic diastolic CHF (congestive heart failure) (H[facility])   Chest pain: h/o NSTEMI currently chest pain free, troponin mild and flat, ekg afib/chronic RBBB, QTC prolongation, no acute changes, on asa/betablocker, ldl 36, not a candidate for statin with chronic liver issues.  Limited echo ? Reduced EF, cardiology recommend outpatient followup, appointment on 9/26  Afib/rvr: briefly on cardizem drip initially on presentation, now on betablocker, rate controlled, CHADSvasc score 3 with age 40, h/o chf, he does has h/o gi bleed and h/o alcohol and substance abuse, has not been on anticoagulation, may not be a good candidate for amiodarone due to chronic liver disease,  meds adjustment per cardiology,  on  lopressor TID and asa 325.  Acute on chronic diastolic chf exacerbation: edema improving, on lasix/spironolactone, dose adjustment per cardiology, patient also has low albumin Appreciate cardiology input   DVT: age indeterminate, not a candidate for anticoagulation. not on anticoagulation due to noncompliance and gi bleed, patient denies h/o DVT Per venous doppler done in 2013, he had dvts in right lower extremity  Venous doppler done this hospitalization : + dvt's RLE and LLE, awaiting final report,  I have discuss the case with interventional radiology Dr Levan Hurst on 9/11, who recommend IVC filter if acute DVT.  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   Hypok/hypomag: replace k/mag, avoid oral mag due to c/o diarrhea  Chronic diarrhea:  recent stool study  negative for cdiff and negative GI prc panel, started pancreatic enzyme supplement   Due to c/o abdominal pain, CT Ab /pel no acute findings, does has cholilithiasis, lft wnl, no n/v.  repeat stool gi prc panel negative, # of bm not accurately documented, consider questran if diarrhea significant, he has been on sucralfate which should slow down diarrhea as well. continue outpatient GI follow up  Hepc/cirrhosis/?hcc: elevated ammonia level , will need to have bm 3-4 times a day, need outpatient GI/ID followup, patient expressed understanding  Hypothyroidism: continue synthroid, tsh significantly elevated, need education for meds adherence    Medication noncompliance/alcohol and polysubstance abuse per chart review h/o heroin use as well  Deconditioning: patient report no driving, does not walk, he stay in bed and watch TV all day for the last 1-2 months, he report significant dyspnea when he walks a few steps, patient report lives with wife who has early dementia, home health with social worker  Requested.  Code Status: full  Family Communication: patient and his daughter at bedside on 9/11, patient's wife over the phone on 9/11  Disposition Plan:  home with home health on 9/12 with cardiology clearance   Consultants:  Cardiology  IR (phone conversation with IR Dr Bobetta Lime)  Phone conversation with Vascular surgery Dr fields on  9/11 regarding lower extremity DVT who recommend patient to have repeat venous doppler in a few weeks, place ivc filter if increased clot burden, otherwise interval venous US to monitor.  Procedures:  none  Antibiotics:  none    Discharge Exam: BP 113/75 (BP Location: Left Arm)   Pulse (!) 119   Temp 98.4 F (36.9 C) (Oral)   Resp 17   Ht 6' (1.829 m)   Wt 92.6 kg (204 lb 3.2 oz)   SpO2 99%   BMI 27.69 kg/m     General:  Weak but NAD  Cardiovascular: IRRR  Respiratory: crackles at baseline has decreased  Abdomen:  Soft/ND/NT, positive BS  Musculoskeletal: diffuse pitting  Edema has improved  Neuro: aaox3   Discharge Instructions You were cared for by a hospitalist during your hospital stay. If you have any questions about your discharge medications or the care you received while you were in the hospital after you are discharged, you can call the unit and asked to speak with the hospitalist on call if the hospitalist that took care of you is not available. Once you are discharged, your primary care physician will handle any further medical issues. Please note that NO REFILLS for any discharge medications will be authorized once you are discharged, as it is imperative that you return to your primary care physician (or establish a relationship with a primary care physician if you do not have one) for your aftercare needs so that they can reassess your need for medications and monitor your lab values.  Discharge Instructions    Diet - low sodium heart healthy    Complete by:  As directed   Face-to-face encounter (required for Medicare/Medicaid patients)    Complete by:  As directed   I Sophiea Ueda certify that this patient is under my care and that I, or a nurse practitioner or physician's assistant working with me, had a face-to-face encounter that meets the physician face-to-face encounter requirements with this patient on 06/10/2016. The encounter with the patient was in whole, or in part for the following medical condition(s) which is the primary reason for home health care (List medical condition): FTT   The encounter with the patient was in whole, or in part, for the following medical condition, which is the primary reason for home health care:  FTT, chf exacerbation   I certify that, based on my findings, the following services are medically necessary home health services:   Nursing Physical therapy     Reason for Medically Necessary Home Health Services:  Skilled Nursing- Change/Decline in Patient Status   My  clinical findings support the need for the above services:  Shortness of breath with activity   Further, I certify that my clinical findings support that this patient is homebound due to:  Shortness of Breath with activity   Home Health    Complete by:  As directed   To provide the following care/treatments:   PT RN OT Social work     Increase activity slowly    Complete by:  As directed       Medication List    STOP taking these medications   amiodarone 200 MG tablet Commonly known as:  PACERONE   omeprazole 40 MG capsule Commonly known as:  PRILOSEC   simethicone 80 MG chewable tablet Commonly known as:  MYLICON   traMADol 50 MG tablet Commonly known as:  ULTRAM   traZODone 100 MG tablet Commonly known as:  DESYREL  TAKE these medications   aspirin EC 325 MG tablet Take 1 tablet (325 mg total) by mouth daily.   folic acid 1 MG tablet Commonly known as:  FOLVITE Take 1 tablet (1 mg total) by mouth daily.   furosemide 40 MG tablet Commonly known as:  LASIX Take 1 tablet (40 mg total) by mouth daily.   levothyroxine 50 MCG tablet Commonly known as:  SYNTHROID, LEVOTHROID Take 1 tablet (50 mcg total) by mouth daily before breakfast.   lipase/protease/amylase 12000 units Cpep capsule Commonly known as:  CREON Take 1 capsule (12,000 Units total) by mouth 3 (three) times daily with meals.   magnesium oxide 400 (241.3 Mg) MG tablet Commonly known as:  MAG-OX Take 1 tablet (400 mg total) by mouth daily.   metoprolol tartrate 25 MG tablet Commonly known as:  LOPRESSOR Take 1 tablet (25 mg total) by mouth 3 (three) times daily. What changed:  how much to take  when to take this   multivitamin with minerals Tabs tablet Take 1 tablet by mouth daily.   potassium chloride 10 MEQ tablet Commonly known as:  K-DUR Take 2 tablets (20 mEq total) by mouth daily.   saccharomyces boulardii 250 MG capsule Commonly known as:  FLORASTOR Take 1 capsule (250 mg  total) by mouth 2 (two) times daily.   spironolactone 25 MG tablet Commonly known as:  ALDACTONE Take 0.5 tablets (12.5 mg total) by mouth daily. What changed:  medication strength  how much to take   sucralfate 1 g tablet Commonly known as:  CARAFATE Take 1 tablet (1 g total) by mouth 2 (two) times daily. What changed:  when to take this   thiamine 100 MG tablet Take 1 tablet (100 mg total) by mouth daily.   VITAMIN D PO Take 1 tablet by mouth daily.   zolpidem 5 MG tablet Commonly known as:  AMBIEN Take 1 tablet (5 mg total) by mouth at bedtime as needed for sleep.      No Known Allergies Follow-up Information    MACKENZIE,BRIAN, MD Follow up in 1 week(s).   Specialty:  Internal Medicine Why:  hospital discharge follow up, repeat cbc/cmp/mag at follow up.  repeat tsh in 4-6 weeks to monitor thyroid function. pmd to arrange repeat lower extremity venous doppler to access clot burden. place ivf filter if clot increase in size. Contact information: [address] [place] [address] [phone]        Bobby Rumpf, MD Follow up in 2 week(s).   Specialty:  Infectious Diseases Why:  for hepatitic c management Contact information: [place] [address] [phone]        Pricilla Riffle. Fuller Plan, MD Follow up in 2 week(s).   Specialty:  Gastroenterology Why:  cirrhosis, chronic diarrhea Contact information: [address] [place] [address] [phone]        Scott Weaver, PA-C Follow up on 06/30/2016.   Specialties:  Cardiology, Physician Assistant Why:  at 12:15 for hospital follow up Contact information: [address] [place] [address] [phone]        [place] .   Why:  Physical /Occupational Therapy Contact information: [address] [place] [address] [phone]            The results of significant diagnostics from this  hospitalization (including imaging, microbiology, ancillary and laboratory) are listed below for reference.    Significant Diagnostic Studies: Dg Chest  2 View  Result Date: 06/09/2016 CLINICAL DATA:  Chest pain, diarrhea and shortness of breath. History of hypertension, hepatitis and cirrhosis. EXAM: CHEST  2 VIEW COMPARISON:  05/30/2016 FINDINGS: Cardiac silhouette is mildly enlarged. No mediastinal or hilar masses or evidence of adenopathy. There is lung base opacity consistent with combination of small effusions and atelectasis. Infection is possible but felt less likely. Remainder of the lungs is clear. No pneumothorax. Bony thorax is demineralized but intact. IMPRESSION: 1. Mild lung base opacity consistent with small effusions and atelectasis. Consider pneumonia if there are consistent clinical symptoms. 2. Mild cardiomegaly. No evidence of pulmonary edema. No pneumothorax. Electronically Signed   By: Lajean Manes M.D.   On: 06/09/2016 16:41   Ct Angio Chest Pe W Or Wo Contrast  Result Date: 06/09/2016 CLINICAL DATA:  Chest pain radiating to LEFT arm. Shortness of breath. History of atrial fibrillation, hypertension, alcohol abuse, hepatitis-C and hepatic cellular carcinoma. EXAM: CT ANGIOGRAPHY CHEST WITH CONTRAST TECHNIQUE: Multidetector CT imaging of the chest was performed using the standard protocol during bolus administration of intravenous contrast. Multiplanar CT image reconstructions and MIPs were obtained to evaluate the vascular anatomy. CONTRAST:  80 cc Isovue 370 COMPARISON:  Chest radiograph June 09, 2016 at 1351 hours and CT abdomen and pelvis May 28, 2016 FINDINGS: PULMONARY ARTERY: Adequate contrast opacification of the pulmonary artery's. Main pulmonary artery is enlarged, 3.7 cm in transaxial dimension. No pulmonary arterial filling defects to the level of the subsegmental branches. MEDIASTINUM: The heart is moderately enlarged. No RIGHT heart strain. No pericardial effusions.  Minimal coronary artery calcifications. Sub cm cardio phrenic angle lymph nodes. No mediastinal lymphadenopathy by CT size criteria. LUNGS: Small to moderate lobulated bilateral pleural effusions. Bronchial wall thickening, predominately in the lung bases. 4 mm ground-glass nodule RIGHT lower lobe, below size followup recommendations. Compressive atelectasis. Old RIGHT posterior rib fractures. SOFT TISSUES AND OSSEOUS STRUCTURES: Multiple tiny gallstones partially imaged in the abdomen. Small amount of ascites. Nodular liver compatible with history of cirrhosis ; not tailored for evaluation of patient's known hepatic cellular carcinoma. Mild gynecomastia. Sub cm sclerotic focus LEFT posterior T6 vertebral body favoring bone island. Review of the MIP images confirms the above findings. IMPRESSION: No acute pulmonary embolism. Moderate cardiomegaly and small to moderate pleural effusions new from May 28, 2016. Bibasilar bronchial wall thickening favoring pulmonary edema. Enlarged main pulmonary artery associated with pulmonary arterial hypertension. Cirrhosis and small amount of ascites. Electronically Signed   By: Elon Alas M.D.   On: 06/09/2016 16:45   Mr Abdomen W Wo Contrast  Result Date: 05/31/2016 CLINICAL DATA:  Diarrhea, cirrhosis. EXAM: MRI ABDOMEN WITHOUT AND WITH CONTRAST TECHNIQUE: Multiplanar multisequence MR imaging of the abdomen was performed both before and after the administration of intravenous contrast. CONTRAST:  72mL MULTIHANCE GADOBENATE DIMEGLUMINE 529 MG/ML IV SOLN COMPARISON:  CT 05/28/2016 FINDINGS: Lower chest: Small bilateral pleural effusions. Anasarca soft tissues the trunk Hepatobiliary: Marked hepatic steatosis noted on opposed phase imaging (series 8). Liver has a nodular contour and is shrunken. There is no focal hepatic lesion. Mild gallbladder wall thickening. Common bile duct is upper limits of normal at 6 mm common hepatic duct similar 7 mm no filling defect within  the common bile duct (series 4). Portal veins are patent. Splenic vein is patent. Venous collaterals the epigastric region along the esophagus. No ascites. Delayed contrast imaging demonstrates linear enhancement in the liver consistent with architectural distortion of fibrosis. No discrete enhancing lesion. Pancreas: No duct dilatation.  No abnormal enhancement Spleen: Normal volume. Adrenals/urinary tract: Adrenal glands and kidneys are normal. Stomach/Bowel: Epigastric varices. Stomach limited view of the bowel is unremarkable Vascular/Lymphatic: Abdominal aortic normal caliber. No retroperitoneal periportal lymphadenopathy. Musculoskeletal: No aggressive osseous lesion IMPRESSION: 1. Mild dilatation of the common hepatic duct and common bile duct without choledocholithiasis or obstruction evident. 2. No clear stones within the gallbladder. Sludge within the gallbladder. No gallbladder distension. 3. Morphologic changes in liver consistent cirrhosis. 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 No evidence hepatoma. No ascites. 4. Marked hepatic steatosis. 5. Small effusions and anasarca in the trunk. Electronically Signed   By: Suzy Bouchard M.D.   On: 05/31/2016 07:16   Ct Abdomen Pelvis W Contrast  Result Date: 06/11/2016 CLINICAL DATA:  Pt c/o persistent diarrhea, peri-umbilical pain; pt has had diarrhea x 2 weeks, without any improvement EXAM: CT ABDOMEN AND PELVIS WITH CONTRAST TECHNIQUE: Multidetector CT imaging of the abdomen and pelvis was performed using the standard protocol following bolus administration of intravenous contrast. CONTRAST:  100 mL of Isovue-300 intravenous contrast COMPARISON:  05/28/2016 FINDINGS: Lower chest: Small, left greater than right, pleural effusions associated with dependent lower lobe atelectasis. No evidence of pneumonia or edema. Heart normal in size. Hepatobiliary: Heterogeneous liver attenuation and enhancement. Liver is decreased in overall  attenuation consistent with fatty infiltration. There is surface nodularity with some central volume loss consistent with cirrhosis. No discrete liver mass. Multiple gallstones. Stable prominence of the common bile duct. No duct stones. Pancreas: Unremarkable. Spleen: Normal. Adrenals/Urinary Tract: No adrenal masses. Bilateral renal cortical thinning. Small nonobstructing stones in the lower pole of the left kidney. No renal masses. No hydronephrosis. Normal ureters. Normal bladder. Stomach/Bowel: Stomach and small bowel are unremarkable. Colon is mostly decompressed but also unremarkable. Normal appendix visualized. Vascular/Lymphatic: There are varices in the upper abdomen with dilation of coronary vein and paraesophageal varices. There are several prominent to mildly enlarged gastrohepatic ligament lymph nodes. There is anterior gastrohepatic ligament lymph node measuring 11 mm in short axis, slightly larger than on the prior CT. No other adenopathy. Reproductive: Unremarkable. Musculoskeletal: Degenerative changes noted throughout the lumbar spine. Bones are demineralized. No osteoblastic or osteolytic lesions. Other: Small amount of ascites noted adjacent to the liver and in the posterior pelvic recess. There is subcutaneous edema along the flanks. IMPRESSION: 1. No definite acute findings in the abdomen or pelvis. No inflammation or other bowel abnormality to indicate a cause for the patient's diarrhea. 2. There are changes of cirrhosis as well as hepatic steatosis. Consider steatohepatitis if there are consistent clinical symptoms. 3. Small, left greater than right, pleural effusions. Small amount of ascites. 4. Mild, presumed reactive, adenopathy along the gastrohepatic ligament. 5. 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 6. Cholelithiasis. Electronically Signed   By: Lajean Manes M.D.   On: 06/11/2016 15:49   Ct Abdomen Pelvis W Contrast  Result Date: 05/28/2016 CLINICAL  DATA:  Abdominal pain.  Diarrhea for 1 week. EXAM: CT ABDOMEN AND PELVIS WITH CONTRAST TECHNIQUE: Multidetector CT imaging of the abdomen and pelvis was performed using the standard protocol following bolus administration of intravenous contrast. CONTRAST:  116mL ISOVUE-300 IOPAMIDOL (ISOVUE-300) INJECTION 61% COMPARISON:  CT 04/18/2016 FINDINGS: Lower chest: Small pleural effusions, lost slightly larger than on prior CT. Adjacent atelectasis in the lower lobes. Liver: Diffusely decreased density consistent with steatosis. Liver parenchyma is diffusely heterogeneous. Probable fibrosis linear bands. Nodular hepatic contours consistent with cirrhosis. Small amount of perihepatic fluid. Hepatobiliary: Multiple gallstones within physiologically distended  gallbladder. Prominent common bile duct measuring 8-9 mm distally. No calcified choledocholithiasis. Pancreas: Parenchymal atrophy. No ductal dilatation or inflammation. Spleen: Normal.  No splenomegaly. Adrenal glands: No nodule. Kidneys: Symmetric renal enhancement. No hydronephrosis. Listen about delayed excretion on delayed phase imaging. Stomach/Bowel: Paraesophageal and mild perigastric varices. Stomach physiologically distended. There are no dilated or thickened small bowel loops. Wall thickening of the cecum and proximal ascending colon without surrounding fat stranding. Stool contrast throughout the remainder the colon. The appendix is normal. Vascular/Lymphatic: Prominent porta hepatis nodes. No retroperitoneal adenopathy. Abdominal aorta is normal in caliber. Atherosclerosis of the abdominal aorta without aneurysm. Reproductive: No acute abnormality. Bladder: Minimally distended without wall thickening. Other: Small volume intra-abdominal ascites tracking in the pelvis. No free air. No intra-abdominal abscess. Mild whole body wall edema, increased from prior CT. Musculoskeletal: There are no acute or suspicious osseous abnormalities. Degenerative change in  the spine, stable. IMPRESSION: 1. Colonic wall thickening involving the cecum and proximal ascending colon. This may be colitis, infectious or inflammatory, however given cirrhosis, portal enteropathy could have this appearance. 2. Advanced cirrhosis with significant fatty infiltration of the liver. Peri-gastric and paraesophageal varices. Small volume of ascites. 3. Cholelithiasis without signs of gallbladder inflammation. 4. Small pleural effusions. Electronically Signed   By: Jeb Levering M.D.   On: 05/28/2016 02:53   Dg Chest Port 1 View  Result Date: 05/30/2016 CLINICAL DATA:  Line placement EXAM: PORTABLE CHEST 1 VIEW COMPARISON:  04/28/2016 FINDINGS: Right PICC line is in place with the tip in the upper SVC. Heart is normal size. No confluent airspace opacities or effusions. No acute bony abnormality. IMPRESSION: Right PICC line tip in the upper SVC. No active disease. Electronically Signed   By: Rolm Baptise M.D.   On: 05/30/2016 11:14    Microbiology: Recent Results (from the past 240 hour(s))  Urine culture     Status: Abnormal   Collection Time: 06/09/16  8:58 PM  Result Value Ref Range Status   Specimen Description URINE, CLEAN CATCH  Final   Special Requests NONE  Final   Culture <10,000 COLONIES/mL INSIGNIFICANT GROWTH (A)  Final   Report Status 06/11/2016 FINAL  Final  MRSA PCR Screening     Status: None   Collection Time: 06/09/16 10:30 PM  Result Value Ref Range Status   MRSA by PCR NEGATIVE NEGATIVE Final    Comment:        The GeneXpert MRSA Assay (FDA approved for NASAL specimens only), is one component of a comprehensive MRSA colonization surveillance program. It is not intended to diagnose MRSA infection nor to guide or monitor treatment for MRSA infections.   Gastrointestinal Panel by PCR , Stool     Status: None   Collection Time: 06/11/16 11:00 AM  Result Value Ref Range Status   Campylobacter species NOT DETECTED NOT DETECTED Final   Plesimonas  shigelloides NOT DETECTED NOT DETECTED Final   Salmonella species NOT DETECTED NOT DETECTED Final   Yersinia enterocolitica NOT DETECTED NOT DETECTED Final   Vibrio species NOT DETECTED NOT DETECTED Final   Vibrio cholerae NOT DETECTED NOT DETECTED Final   Enteroaggregative E coli (EAEC) NOT DETECTED NOT DETECTED Final   Enteropathogenic E coli (EPEC) NOT DETECTED NOT DETECTED Final   Enterotoxigenic E coli (ETEC) NOT DETECTED NOT DETECTED Final   Shiga like toxin producing E coli (STEC) NOT DETECTED NOT DETECTED Final   Shigella/Enteroinvasive E coli (EIEC) NOT DETECTED NOT DETECTED Final   Cryptosporidium NOT DETECTED NOT DETECTED Final  Cyclospora cayetanensis NOT DETECTED NOT DETECTED Final   Entamoeba histolytica NOT DETECTED NOT DETECTED Final   Giardia lamblia NOT DETECTED NOT DETECTED Final   Adenovirus F40/41 NOT DETECTED NOT DETECTED Final   Astrovirus NOT DETECTED NOT DETECTED Final   Norovirus GI/GII NOT DETECTED NOT DETECTED Final   Rotavirus A NOT DETECTED NOT DETECTED Final   Sapovirus (I, II, IV, and V) NOT DETECTED NOT DETECTED Final     Labs: Basic Metabolic Panel:  Recent Labs Lab 06/10/16 0758  06/12/16 0659 06/13/16 0449 06/14/16 0527 06/15/16 0329 06/16/16 1129  NA 139  < > 137 140 140 137 140  K 3.1*  < > 3.1* 2.9* 4.5 3.9 4.2  CL 107  < > 103 105 104 105 108  CO2 26  < > 27 30 28 28 25   GLUCOSE 90  < > 86 90 97 103* 95  BUN <5*  < > <5* <5* 6 7 9   CREATININE 0.84  < > 0.79 0.74 0.84 0.88 0.85  CALCIUM 7.4*  < > 7.8* 7.9* 8.1* 8.3* 8.5*  MG 1.6*  < > 1.6* 1.6* 1.5* 1.8 1.6*  PHOS 3.8  --   --   --   --   --   --   < > = values in this interval not displayed. Liver Function Tests:  Recent Labs Lab 06/10/16 0758 06/12/16 0659 06/13/16 0449 06/14/16 0527  AST 55* 59* 57* 65*  ALT 21 19 19 19   ALKPHOS 62 70 64 82  BILITOT 1.5* 1.3* 1.3* 1.0  PROT 4.9* 5.6* 5.5* 5.9*  ALBUMIN 1.7* 1.9* 1.9* 1.8*   No results for input(s): LIPASE, AMYLASE in  the last 168 hours.  Recent Labs Lab 06/14/16 0527 06/16/16 1129  AMMONIA 57* 44*   CBC:  Recent Labs Lab 06/10/16 0758 06/11/16 0235 06/14/16 0527  WBC 3.8* 3.6* 4.2  HGB 10.6* 11.8* 12.5*  HCT 31.8* 35.9* 37.3*  MCV 101.3* 102.3* 102.8*  PLT 124* 132* 139*   Cardiac Enzymes:  Recent Labs Lab 06/09/16 2253 06/10/16 0758 06/10/16 1044  TROPONINI 0.03* 0.03* 0.03*   BNP: BNP (last 3 results)  Recent Labs  04/18/16 1855 04/29/16 0305 06/09/16 1500  BNP 375.8* 413.5* 463.2*    ProBNP (last 3 results) No results for input(s): PROBNP in the last 8760 hours.  CBG: No results for input(s): GLUCAP in the last 168 hours.     SignedFlorencia Reasons MD, PhD  [facility] 06/16/2016, 7:28 PM

## 2016-06-23 DIAGNOSIS — Z09 Encounter for follow-up examination after completed treatment for conditions other than malignant neoplasm: Secondary | ICD-10-CM | POA: Diagnosis not present

## 2016-06-23 DIAGNOSIS — I509 Heart failure, unspecified: Secondary | ICD-10-CM | POA: Diagnosis not present

## 2016-06-25 ENCOUNTER — Encounter (HOSPITAL_COMMUNITY)
Admission: EM | Disposition: A | Discharge status: Rehabilitation Facility | Payer: Self-pay | Source: Home / Self Care | Attending: Neurology

## 2016-06-25 ENCOUNTER — Inpatient Hospital Stay (HOSPITAL_COMMUNITY): Payer: Medicare Other

## 2016-06-25 ENCOUNTER — Emergency Department (HOSPITAL_COMMUNITY): Payer: Medicare Other

## 2016-06-25 ENCOUNTER — Emergency Department (HOSPITAL_COMMUNITY): Payer: Medicare Other | Admitting: Certified Registered Nurse Anesthetist

## 2016-06-25 ENCOUNTER — Inpatient Hospital Stay (HOSPITAL_COMMUNITY)
Admission: EM | Admit: 2016-06-25 | Discharge: 2016-07-04 | DRG: 023 | Disposition: A | Discharge status: Rehabilitation Facility | Payer: Medicare Other | Attending: Neurology | Admitting: Neurology

## 2016-06-25 ENCOUNTER — Encounter (HOSPITAL_COMMUNITY): Payer: Self-pay

## 2016-06-25 DIAGNOSIS — Z9119 Patient's noncompliance with other medical treatment and regimen: Secondary | ICD-10-CM | POA: Diagnosis not present

## 2016-06-25 DIAGNOSIS — K7460 Unspecified cirrhosis of liver: Secondary | ICD-10-CM | POA: Diagnosis present

## 2016-06-25 DIAGNOSIS — I5042 Chronic combined systolic (congestive) and diastolic (congestive) heart failure: Secondary | ICD-10-CM | POA: Diagnosis present

## 2016-06-25 DIAGNOSIS — F1910 Other psychoactive substance abuse, uncomplicated: Secondary | ICD-10-CM | POA: Diagnosis present

## 2016-06-25 DIAGNOSIS — K219 Gastro-esophageal reflux disease without esophagitis: Secondary | ICD-10-CM | POA: Diagnosis present

## 2016-06-25 DIAGNOSIS — Z8042 Family history of malignant neoplasm of prostate: Secondary | ICD-10-CM

## 2016-06-25 DIAGNOSIS — I5033 Acute on chronic diastolic (congestive) heart failure: Secondary | ICD-10-CM | POA: Diagnosis not present

## 2016-06-25 DIAGNOSIS — Q251 Coarctation of aorta: Secondary | ICD-10-CM

## 2016-06-25 DIAGNOSIS — K7030 Alcoholic cirrhosis of liver without ascites: Secondary | ICD-10-CM | POA: Diagnosis present

## 2016-06-25 DIAGNOSIS — G8194 Hemiplegia, unspecified affecting left nondominant side: Secondary | ICD-10-CM | POA: Diagnosis not present

## 2016-06-25 DIAGNOSIS — R471 Dysarthria and anarthria: Secondary | ICD-10-CM | POA: Diagnosis not present

## 2016-06-25 DIAGNOSIS — I69322 Dysarthria following cerebral infarction: Secondary | ICD-10-CM

## 2016-06-25 DIAGNOSIS — I6320 Cerebral infarction due to unspecified occlusion or stenosis of unspecified precerebral arteries: Secondary | ICD-10-CM | POA: Diagnosis not present

## 2016-06-25 DIAGNOSIS — I6601 Occlusion and stenosis of right middle cerebral artery: Secondary | ICD-10-CM | POA: Diagnosis not present

## 2016-06-25 DIAGNOSIS — N179 Acute kidney failure, unspecified: Secondary | ICD-10-CM | POA: Diagnosis present

## 2016-06-25 DIAGNOSIS — I63511 Cerebral infarction due to unspecified occlusion or stenosis of right middle cerebral artery: Secondary | ICD-10-CM | POA: Diagnosis not present

## 2016-06-25 DIAGNOSIS — G4700 Insomnia, unspecified: Secondary | ICD-10-CM | POA: Diagnosis not present

## 2016-06-25 DIAGNOSIS — W19XXXA Unspecified fall, initial encounter: Secondary | ICD-10-CM | POA: Diagnosis present

## 2016-06-25 DIAGNOSIS — R1310 Dysphagia, unspecified: Secondary | ICD-10-CM | POA: Diagnosis present

## 2016-06-25 DIAGNOSIS — I454 Nonspecific intraventricular block: Secondary | ICD-10-CM | POA: Diagnosis present

## 2016-06-25 DIAGNOSIS — K909 Intestinal malabsorption, unspecified: Secondary | ICD-10-CM | POA: Diagnosis not present

## 2016-06-25 DIAGNOSIS — Z79899 Other long term (current) drug therapy: Secondary | ICD-10-CM

## 2016-06-25 DIAGNOSIS — R509 Fever, unspecified: Secondary | ICD-10-CM

## 2016-06-25 DIAGNOSIS — I7771 Dissection of carotid artery: Secondary | ICD-10-CM | POA: Diagnosis not present

## 2016-06-25 DIAGNOSIS — I509 Heart failure, unspecified: Secondary | ICD-10-CM | POA: Diagnosis not present

## 2016-06-25 DIAGNOSIS — I252 Old myocardial infarction: Secondary | ICD-10-CM

## 2016-06-25 DIAGNOSIS — T39395A Adverse effect of other nonsteroidal anti-inflammatory drugs [NSAID], initial encounter: Secondary | ICD-10-CM

## 2016-06-25 DIAGNOSIS — K7031 Alcoholic cirrhosis of liver with ascites: Secondary | ICD-10-CM | POA: Diagnosis not present

## 2016-06-25 DIAGNOSIS — I6789 Other cerebrovascular disease: Secondary | ICD-10-CM | POA: Diagnosis not present

## 2016-06-25 DIAGNOSIS — C228 Malignant neoplasm of liver, primary, unspecified as to type: Secondary | ICD-10-CM | POA: Diagnosis not present

## 2016-06-25 DIAGNOSIS — E039 Hypothyroidism, unspecified: Secondary | ICD-10-CM | POA: Diagnosis not present

## 2016-06-25 DIAGNOSIS — R29715 NIHSS score 15: Secondary | ICD-10-CM | POA: Diagnosis not present

## 2016-06-25 DIAGNOSIS — R2990 Unspecified symptoms and signs involving the nervous system: Secondary | ICD-10-CM

## 2016-06-25 DIAGNOSIS — C220 Liver cell carcinoma: Secondary | ICD-10-CM | POA: Diagnosis not present

## 2016-06-25 DIAGNOSIS — I10 Essential (primary) hypertension: Secondary | ICD-10-CM | POA: Diagnosis present

## 2016-06-25 DIAGNOSIS — Z7982 Long term (current) use of aspirin: Secondary | ICD-10-CM

## 2016-06-25 DIAGNOSIS — I82409 Acute embolism and thrombosis of unspecified deep veins of unspecified lower extremity: Secondary | ICD-10-CM | POA: Diagnosis not present

## 2016-06-25 DIAGNOSIS — Z9114 Patient's other noncompliance with medication regimen: Secondary | ICD-10-CM

## 2016-06-25 DIAGNOSIS — I69391 Dysphagia following cerebral infarction: Secondary | ICD-10-CM

## 2016-06-25 DIAGNOSIS — I2510 Atherosclerotic heart disease of native coronary artery without angina pectoris: Secondary | ICD-10-CM | POA: Diagnosis present

## 2016-06-25 DIAGNOSIS — Z8249 Family history of ischemic heart disease and other diseases of the circulatory system: Secondary | ICD-10-CM

## 2016-06-25 DIAGNOSIS — D638 Anemia in other chronic diseases classified elsewhere: Secondary | ICD-10-CM | POA: Diagnosis not present

## 2016-06-25 DIAGNOSIS — D6859 Other primary thrombophilia: Secondary | ICD-10-CM | POA: Diagnosis not present

## 2016-06-25 DIAGNOSIS — I4581 Long QT syndrome: Secondary | ICD-10-CM | POA: Diagnosis not present

## 2016-06-25 DIAGNOSIS — Z86718 Personal history of other venous thrombosis and embolism: Secondary | ICD-10-CM | POA: Diagnosis not present

## 2016-06-25 DIAGNOSIS — J9601 Acute respiratory failure with hypoxia: Secondary | ICD-10-CM

## 2016-06-25 DIAGNOSIS — R739 Hyperglycemia, unspecified: Secondary | ICD-10-CM | POA: Diagnosis present

## 2016-06-25 DIAGNOSIS — Z4659 Encounter for fitting and adjustment of other gastrointestinal appliance and device: Secondary | ICD-10-CM

## 2016-06-25 DIAGNOSIS — E876 Hypokalemia: Secondary | ICD-10-CM | POA: Diagnosis not present

## 2016-06-25 DIAGNOSIS — F1020 Alcohol dependence, uncomplicated: Secondary | ICD-10-CM | POA: Diagnosis present

## 2016-06-25 DIAGNOSIS — J9600 Acute respiratory failure, unspecified whether with hypoxia or hypercapnia: Secondary | ICD-10-CM | POA: Diagnosis not present

## 2016-06-25 DIAGNOSIS — Z978 Presence of other specified devices: Secondary | ICD-10-CM

## 2016-06-25 DIAGNOSIS — Z87891 Personal history of nicotine dependence: Secondary | ICD-10-CM

## 2016-06-25 DIAGNOSIS — I480 Paroxysmal atrial fibrillation: Secondary | ICD-10-CM | POA: Diagnosis present

## 2016-06-25 DIAGNOSIS — K760 Fatty (change of) liver, not elsewhere classified: Secondary | ICD-10-CM | POA: Diagnosis not present

## 2016-06-25 DIAGNOSIS — R2681 Unsteadiness on feet: Secondary | ICD-10-CM

## 2016-06-25 DIAGNOSIS — R197 Diarrhea, unspecified: Secondary | ICD-10-CM | POA: Diagnosis not present

## 2016-06-25 DIAGNOSIS — S81802A Unspecified open wound, left lower leg, initial encounter: Secondary | ICD-10-CM | POA: Diagnosis present

## 2016-06-25 DIAGNOSIS — F1010 Alcohol abuse, uncomplicated: Secondary | ICD-10-CM

## 2016-06-25 DIAGNOSIS — E860 Dehydration: Secondary | ICD-10-CM | POA: Diagnosis not present

## 2016-06-25 DIAGNOSIS — Z823 Family history of stroke: Secondary | ICD-10-CM

## 2016-06-25 DIAGNOSIS — I69392 Facial weakness following cerebral infarction: Secondary | ICD-10-CM | POA: Diagnosis not present

## 2016-06-25 DIAGNOSIS — I63411 Cerebral infarction due to embolism of right middle cerebral artery: Secondary | ICD-10-CM | POA: Diagnosis not present

## 2016-06-25 DIAGNOSIS — Z8711 Personal history of peptic ulcer disease: Secondary | ICD-10-CM

## 2016-06-25 DIAGNOSIS — B1920 Unspecified viral hepatitis C without hepatic coma: Secondary | ICD-10-CM | POA: Diagnosis present

## 2016-06-25 DIAGNOSIS — I471 Supraventricular tachycardia: Secondary | ICD-10-CM | POA: Diagnosis not present

## 2016-06-25 DIAGNOSIS — D62 Acute posthemorrhagic anemia: Secondary | ICD-10-CM | POA: Diagnosis not present

## 2016-06-25 DIAGNOSIS — D72829 Elevated white blood cell count, unspecified: Secondary | ICD-10-CM

## 2016-06-25 DIAGNOSIS — R0682 Tachypnea, not elsewhere classified: Secondary | ICD-10-CM

## 2016-06-25 DIAGNOSIS — S3092XA Unspecified superficial injury of abdominal wall, initial encounter: Secondary | ICD-10-CM | POA: Diagnosis not present

## 2016-06-25 DIAGNOSIS — K922 Gastrointestinal hemorrhage, unspecified: Secondary | ICD-10-CM | POA: Diagnosis present

## 2016-06-25 DIAGNOSIS — Z23 Encounter for immunization: Secondary | ICD-10-CM | POA: Diagnosis not present

## 2016-06-25 DIAGNOSIS — I69354 Hemiplegia and hemiparesis following cerebral infarction affecting left non-dominant side: Secondary | ICD-10-CM | POA: Diagnosis not present

## 2016-06-25 DIAGNOSIS — I639 Cerebral infarction, unspecified: Secondary | ICD-10-CM

## 2016-06-25 DIAGNOSIS — Z4589 Encounter for adjustment and management of other implanted devices: Secondary | ICD-10-CM | POA: Diagnosis not present

## 2016-06-25 DIAGNOSIS — I5043 Acute on chronic combined systolic (congestive) and diastolic (congestive) heart failure: Secondary | ICD-10-CM | POA: Diagnosis not present

## 2016-06-25 DIAGNOSIS — I4891 Unspecified atrial fibrillation: Secondary | ICD-10-CM | POA: Diagnosis not present

## 2016-06-25 DIAGNOSIS — E669 Obesity, unspecified: Secondary | ICD-10-CM | POA: Diagnosis present

## 2016-06-25 DIAGNOSIS — I110 Hypertensive heart disease with heart failure: Secondary | ICD-10-CM | POA: Diagnosis present

## 2016-06-25 DIAGNOSIS — I69319 Unspecified symptoms and signs involving cognitive functions following cerebral infarction: Secondary | ICD-10-CM | POA: Diagnosis not present

## 2016-06-25 DIAGNOSIS — J9811 Atelectasis: Secondary | ICD-10-CM | POA: Diagnosis not present

## 2016-06-25 DIAGNOSIS — I340 Nonrheumatic mitral (valve) insufficiency: Secondary | ICD-10-CM

## 2016-06-25 DIAGNOSIS — R29810 Facial weakness: Secondary | ICD-10-CM | POA: Diagnosis not present

## 2016-06-25 HISTORY — PX: IR GENERIC HISTORICAL: IMG1180011

## 2016-06-25 HISTORY — PX: RADIOLOGY WITH ANESTHESIA: SHX6223

## 2016-06-25 LAB — I-STAT CHEM 8, ED
CREATININE: 0.6 mg/dL — AB (ref 0.61–1.24)
Calcium, Ion: 1.04 mmol/L — ABNORMAL LOW (ref 1.15–1.40)
Chloride: 110 mmol/L (ref 101–111)
Glucose, Bld: 111 mg/dL — ABNORMAL HIGH (ref 65–99)
HEMATOCRIT: 40 % (ref 39.0–52.0)
Hemoglobin: 13.6 g/dL (ref 13.0–17.0)
POTASSIUM: 4.1 mmol/L (ref 3.5–5.1)
SODIUM: 144 mmol/L (ref 135–145)
TCO2: 20 mmol/L (ref 0–100)

## 2016-06-25 LAB — CBC
HCT: 42.2 % (ref 39.0–52.0)
HEMOGLOBIN: 14.1 g/dL (ref 13.0–17.0)
MCH: 33 pg (ref 26.0–34.0)
MCHC: 33.4 g/dL (ref 30.0–36.0)
MCV: 98.8 fL (ref 78.0–100.0)
PLATELETS: 222 10*3/uL (ref 150–400)
RBC: 4.27 MIL/uL (ref 4.22–5.81)
RDW: 15.2 % (ref 11.5–15.5)
WBC: 4.9 10*3/uL (ref 4.0–10.5)

## 2016-06-25 LAB — BLOOD GAS, ARTERIAL
Acid-base deficit: 7.8 mmol/L — ABNORMAL HIGH (ref 0.0–2.0)
Bicarbonate: 16.7 mmol/L — ABNORMAL LOW (ref 20.0–28.0)
DRAWN BY: 290171
FIO2: 100
O2 Saturation: 99.7 %
PATIENT TEMPERATURE: 98.6
PCO2 ART: 30.6 mmHg — AB (ref 32.0–48.0)
PEEP: 5 cmH2O
PO2 ART: 359 mmHg — AB (ref 83.0–108.0)
RATE: 14 resp/min
VT: 580 mL
pH, Arterial: 7.355 (ref 7.350–7.450)

## 2016-06-25 LAB — PROTIME-INR
INR: 1.25
Prothrombin Time: 15.8 seconds — ABNORMAL HIGH (ref 11.4–15.2)

## 2016-06-25 LAB — COMPREHENSIVE METABOLIC PANEL
ALBUMIN: 2.5 g/dL — AB (ref 3.5–5.0)
ALK PHOS: 78 U/L (ref 38–126)
ALT: 39 U/L (ref 17–63)
AST: 75 U/L — AB (ref 15–41)
Anion gap: 9 (ref 5–15)
BILIRUBIN TOTAL: 1 mg/dL (ref 0.3–1.2)
CALCIUM: 8.3 mg/dL — AB (ref 8.9–10.3)
CO2: 20 mmol/L — AB (ref 22–32)
Chloride: 112 mmol/L — ABNORMAL HIGH (ref 101–111)
Creatinine, Ser: 0.81 mg/dL (ref 0.61–1.24)
GFR calc Af Amer: >60 mL/min (ref >60)
GFR calc non Af Amer: >60 mL/min (ref >60)
GLUCOSE: 113 mg/dL — AB (ref 65–99)
Potassium: 3.9 mmol/L (ref 3.5–5.1)
SODIUM: 141 mmol/L (ref 135–145)
TOTAL PROTEIN: 7.1 g/dL (ref 6.5–8.1)

## 2016-06-25 LAB — APTT: APTT: 27 s (ref 24–36)

## 2016-06-25 LAB — I-STAT TROPONIN, ED: Troponin i, poc: 0.01 ng/mL (ref 0.00–0.08)

## 2016-06-25 LAB — DIFFERENTIAL
BASOS ABS: 0 10*3/uL (ref 0.0–0.1)
Basophils Relative: 1 %
Eosinophils Absolute: 0 10*3/uL (ref 0.0–0.7)
Eosinophils Relative: 0 %
LYMPHS ABS: 1.3 10*3/uL (ref 0.7–4.0)
LYMPHS PCT: 26 %
Monocytes Absolute: 0.7 10*3/uL (ref 0.1–1.0)
Monocytes Relative: 14 %
NEUTROS ABS: 2.9 10*3/uL (ref 1.7–7.7)
NEUTROS PCT: 59 %

## 2016-06-25 LAB — CBG MONITORING, ED: GLUCOSE-CAPILLARY: 108 mg/dL — AB (ref 65–99)

## 2016-06-25 LAB — TSH: TSH: 14.387 u[IU]/mL — ABNORMAL HIGH (ref 0.350–4.500)

## 2016-06-25 LAB — T4, FREE: FREE T4: 0.59 ng/dL — AB (ref 0.61–1.12)

## 2016-06-25 LAB — TRIGLYCERIDES: TRIGLYCERIDES: 136 mg/dL (ref <150)

## 2016-06-25 SURGERY — RADIOLOGY WITH ANESTHESIA
Anesthesia: General

## 2016-06-25 MED ORDER — ROCURONIUM BROMIDE 100 MG/10ML IV SOLN
INTRAVENOUS | Status: DC | PRN
  Administered 2016-06-25: 30 mg via INTRAVENOUS
  Administered 2016-06-25: 20 mg via INTRAVENOUS
  Administered 2016-06-25: 50 mg via INTRAVENOUS

## 2016-06-25 MED ORDER — IOPAMIDOL (ISOVUE-300) INJECTION 61%
INTRAVENOUS | Status: AC
  Administered 2016-06-25: 150 mL
  Filled 2016-06-25: qty 300

## 2016-06-25 MED ORDER — PHENYLEPHRINE HCL 10 MG/ML IJ SOLN
30.0000 ug/min | INTRAMUSCULAR | Status: DC
  Filled 2016-06-25: qty 1

## 2016-06-25 MED ORDER — OXYCODONE HCL 5 MG/5ML PO SOLN: 5.0000 mg | Freq: Once | ORAL | Status: DC | PRN

## 2016-06-25 MED ORDER — IOPAMIDOL (ISOVUE-370) INJECTION 76%
40.0000 mL | Freq: Once | INTRAVENOUS | Status: AC | PRN
  Administered 2016-06-25: 40 mL via INTRAVENOUS

## 2016-06-25 MED ORDER — DILTIAZEM HCL-DEXTROSE 100-5 MG/100ML-% IV SOLN (PREMIX): 5.0000 mg/h | INTRAVENOUS | Status: DC

## 2016-06-25 MED ORDER — FENTANYL CITRATE (PF) 100 MCG/2ML IJ SOLN: 25.0000 ug | INTRAMUSCULAR | Status: DC | PRN

## 2016-06-25 MED ORDER — DILTIAZEM LOAD VIA INFUSION
10.0000 mg | Freq: Once | INTRAVENOUS | Status: DC
  Filled 2016-06-25: qty 10

## 2016-06-25 MED ORDER — LIDOCAINE HCL 1 % IJ SOLN
INTRAMUSCULAR | Status: AC
  Filled 2016-06-25: qty 20

## 2016-06-25 MED ORDER — SUCCINYLCHOLINE CHLORIDE 20 MG/ML IJ SOLN
INTRAMUSCULAR | Status: DC | PRN
  Administered 2016-06-25: 120 mg via INTRAVENOUS

## 2016-06-25 MED ORDER — STROKE: EARLY STAGES OF RECOVERY BOOK
Freq: Once | Status: AC
Start: 2016-06-25 — End: 2016-06-25
  Administered 2016-06-25: 15:00:00
  Filled 2016-06-25 (×2): qty 1

## 2016-06-25 MED ORDER — PROPOFOL 500 MG/50ML IV EMUL
INTRAVENOUS | Status: DC | PRN
  Administered 2016-06-25: 50 ug/kg/min via INTRAVENOUS

## 2016-06-25 MED ORDER — OXYCODONE HCL 5 MG PO TABS: 5.0000 mg | ORAL_TABLET | Freq: Once | ORAL | Status: DC | PRN

## 2016-06-25 MED ORDER — CHLORHEXIDINE GLUCONATE 0.12% ORAL RINSE (MEDLINE KIT)
15.0000 mL | Freq: Two times a day (BID) | OROMUCOSAL | Status: DC
  Administered 2016-06-25: 15 mL via OROMUCOSAL

## 2016-06-25 MED ORDER — LACTATED RINGERS IV SOLN: INTRAVENOUS | Status: DC | PRN

## 2016-06-25 MED ORDER — EPTIFIBATIDE 20 MG/10ML IV SOLN
INTRAVENOUS | Status: DC | PRN
  Administered 2016-06-25 (×4): 1.8 mg via INTRAVENOUS

## 2016-06-25 MED ORDER — SODIUM CHLORIDE 0.9 % IV SOLN
INTRAVENOUS | Status: DC
Start: 2016-06-25 — End: 2016-06-28
  Administered 2016-06-25 – 2016-06-27 (×3): via INTRAVENOUS

## 2016-06-25 MED ORDER — SODIUM CHLORIDE 0.9 % IV SOLN
INTRAVENOUS | Status: DC
  Administered 2016-06-26: 06:00:00 via INTRAVENOUS

## 2016-06-25 MED ORDER — SUCRALFATE 1 GM/10ML PO SUSP
1.0000 g | Freq: Four times a day (QID) | ORAL | Status: DC
  Administered 2016-06-26 – 2016-07-04 (×26): 1 g via ORAL
  Filled 2016-06-25 (×26): qty 10

## 2016-06-25 MED ORDER — CEFAZOLIN SODIUM-DEXTROSE 2-4 GM/100ML-% IV SOLN
INTRAVENOUS | Status: AC
  Filled 2016-06-25: qty 100

## 2016-06-25 MED ORDER — EPTIFIBATIDE 20 MG/10ML IV SOLN
INTRAVENOUS | Status: AC
  Filled 2016-06-25: qty 10

## 2016-06-25 MED ORDER — ORAL CARE MOUTH RINSE: 15.0000 mL | Freq: Four times a day (QID) | OROMUCOSAL | Status: DC

## 2016-06-25 MED ORDER — PHENYLEPHRINE HCL 10 MG/ML IJ SOLN
INTRAMUSCULAR | Status: DC | PRN
  Administered 2016-06-25: 20 ug/min via INTRAVENOUS

## 2016-06-25 MED ORDER — ORAL CARE MOUTH RINSE
15.0000 mL | OROMUCOSAL | Status: DC
  Administered 2016-06-25 – 2016-07-03 (×63): 15 mL via OROMUCOSAL

## 2016-06-25 MED ORDER — NITROGLYCERIN 1 MG/10 ML FOR IR/CATH LAB
INTRA_ARTERIAL | Status: AC
  Filled 2016-06-25: qty 10

## 2016-06-25 MED ORDER — ACETAMINOPHEN 650 MG RE SUPP
650.0000 mg | Freq: Four times a day (QID) | RECTAL | Status: DC | PRN
  Administered 2016-06-27: 650 mg via RECTAL
  Filled 2016-06-25: qty 1

## 2016-06-25 MED ORDER — CEFAZOLIN SODIUM-DEXTROSE 2-3 GM-% IV SOLR
INTRAVENOUS | Status: DC | PRN
  Administered 2016-06-25: 2 g via INTRAVENOUS

## 2016-06-25 MED ORDER — PROPOFOL 1000 MG/100ML IV EMUL
0.0000 ug/kg/min | INTRAVENOUS | Status: DC
Start: 2016-06-25 — End: 2016-06-27
  Administered 2016-06-25: 35 ug/kg/min via INTRAVENOUS
  Administered 2016-06-25: 20 ug/kg/min via INTRAVENOUS
  Administered 2016-06-26: 15 ug/kg/min via INTRAVENOUS
  Administered 2016-06-26: 20 ug/kg/min via INTRAVENOUS
  Administered 2016-06-26 – 2016-06-27 (×2): 15 ug/kg/min via INTRAVENOUS
  Filled 2016-06-25 (×6): qty 100

## 2016-06-25 MED ORDER — GLYCOPYRROLATE 0.2 MG/ML IJ SOLN
INTRAMUSCULAR | Status: DC | PRN
  Administered 2016-06-25: .2 mg via INTRAVENOUS

## 2016-06-25 MED ORDER — CHLORHEXIDINE GLUCONATE 0.12% ORAL RINSE (MEDLINE KIT)
15.0000 mL | Freq: Two times a day (BID) | OROMUCOSAL | Status: DC
  Administered 2016-06-26 (×2): 15 mL via OROMUCOSAL

## 2016-06-25 MED ORDER — SODIUM CHLORIDE 0.9 % IV SOLN
INTRAVENOUS | Status: DC | PRN
  Administered 2016-06-25 (×2): via INTRAVENOUS

## 2016-06-25 MED ORDER — ACETAMINOPHEN 500 MG PO TABS
1000.0000 mg | ORAL_TABLET | Freq: Four times a day (QID) | ORAL | Status: DC | PRN
  Administered 2016-06-26: 1000 mg via ORAL
  Filled 2016-06-25: qty 2

## 2016-06-25 MED ORDER — FENTANYL CITRATE (PF) 100 MCG/2ML IJ SOLN
INTRAMUSCULAR | Status: DC | PRN
  Administered 2016-06-25 (×2): 50 ug via INTRAVENOUS

## 2016-06-25 MED ORDER — LIDOCAINE HCL (CARDIAC) 20 MG/ML IV SOLN
INTRAVENOUS | Status: DC | PRN
  Administered 2016-06-25: 100 mg via INTRAVENOUS

## 2016-06-25 MED ORDER — PROPOFOL 10 MG/ML IV BOLUS
INTRAVENOUS | Status: DC | PRN
  Administered 2016-06-25: 140 mg via INTRAVENOUS

## 2016-06-25 NOTE — ED Notes (Signed)
Failed attempt x2 with ultrasound by Robie Ridge RN.  IV team order to be placed.

## 2016-06-25 NOTE — ED Notes (Signed)
CBG 108. 

## 2016-06-25 NOTE — Anesthesia Procedure Notes (Addendum)
Procedure Name: Intubation Date/Time: 06/25/2016 1:34 PM Performed by: Salli Quarry Kasey Ewings Pre-anesthesia Checklist: Patient identified, Emergency Drugs available, Suction available and Patient being monitored Patient Re-evaluated:Patient Re-evaluated prior to inductionOxygen Delivery Method: Circle System Utilized Preoxygenation: Pre-oxygenation with 100% oxygen Intubation Type: IV induction, Rapid sequence and Cricoid Pressure applied Laryngoscope Size: Mac and 4 Grade View: Grade I Tube type: Subglottic suction tube Tube size: 8.0 mm Number of attempts: 1 Airway Equipment and Method: Stylet Placement Confirmation: ETT inserted through vocal cords under direct vision,  positive ETCO2 and breath sounds checked- equal and bilateral Secured at: 23 cm Tube secured with: Tape Dental Injury: Teeth and Oropharynx as per pre-operative assessment

## 2016-06-25 NOTE — ED Triage Notes (Signed)
Pt brought in EMS from home for left sided weakness, facial droop and right sided gaze.  Pt was LSN at 1030 last night before going to bed.  Wife found pt at 0830 on floor.  Pt reports he fell at 0300.

## 2016-06-25 NOTE — ED Notes (Signed)
Pt returned to room from CT

## 2016-06-25 NOTE — ED Notes (Signed)
Neuro paged and made aware of continued efforts for IV.  Verbal order provided to ED RN and IR RN for IV insertion by MD in IR.  Pt to be transported to IR for IV insertion.  Pt to be returned to ED post perfusion study.

## 2016-06-25 NOTE — Progress Notes (Signed)
eLink Physician-Brief Progress Note Patient Name: Nathan Frank DOB: 1941-01-31 MRN: [id number]   Date of Service  06/25/2016  HPI/Events of Note  Intubated and ventilated. Notified of need for Stress Ulcer prophylaxis. Patient with history of prolonged QT interval and Torsades, therefore, can't give Pepcid or Protonix.   eICU Interventions  Will order: 1. Carafate 1 gm PO Q 6 hours.     Intervention Category Intermediate Interventions: Best-practice therapies (e.g. DVT, beta blocker, etc.)  Sommer,Steven Eugene 06/25/2016, 7:21 PM

## 2016-06-25 NOTE — Anesthesia Postprocedure Evaluation (Signed)
Anesthesia Post Note  Patient: Nathan Frank  Procedure(s) Performed: Procedure(s) (LRB): RADIOLOGY WITH ANESTHESIA (N/A)  Patient location during evaluation: PACU Anesthesia Type: General Level of consciousness: sedated Pain management: pain level controlled Vital Signs Assessment: post-procedure vital signs reviewed and stable Respiratory status: spontaneous breathing, respiratory function stable, patient remains intubated per anesthesia plan and patient on ventilator - see flowsheet for VS Cardiovascular status: blood pressure returned to baseline and stable Postop Assessment: no signs of nausea or vomiting Anesthetic complications: no    Last Vitals:  Vitals:   06/25/16 1900 06/25/16 1954  BP:    Pulse: 94 99  Resp: 19 (!) 24  Temp:      Last Pain:  Vitals:   06/25/16 1745  TempSrc: Oral                 Nathan Frank A

## 2016-06-25 NOTE — Procedures (Signed)
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

## 2016-06-25 NOTE — Progress Notes (Signed)
Called CT Called Respiratory they will meet [place] in CT Rm2 with ventilator. Report given to Melody, RN 3MW Anesthesia at bedside. Groin unremarkable. 48fr sheath left in place. 2+ pulses, edema present

## 2016-06-25 NOTE — Transfer of Care (Signed)
Immediate Anesthesia Transfer of Care Note  Patient: Nathan Frank  Procedure(s) Performed: Procedure(s): RADIOLOGY WITH ANESTHESIA (N/A)  Patient Location: ICU  Anesthesia Type:General  Level of Consciousness: Patient remains intubated per anesthesia plan  Airway & Oxygen Therapy: Patient remains intubated per anesthesia plan and Patient placed on Ventilator (see vital sign flow sheet for setting)  Post-op Assessment: Report given to RN and Post -op Vital signs reviewed and stable  Post vital signs: Reviewed and stable  Last Vitals:  Vitals:   06/25/16 1247 06/25/16 1734  BP: 137/96 (!) 147/74  Pulse: 82 90  Resp:  14    Last Pain: There were no vitals filed for this visit.       Complications: No apparent anesthesia complications

## 2016-06-25 NOTE — ED Provider Notes (Signed)
[facility] DEPT Provider Note   CSN: [id number] Arrival date & time: 06/25/16  0910   History   Chief Complaint Chief Complaint  Patient presents with  . Stroke Symptoms    HPI Nathan Frank is a 75 y.o. male.  The history is provided by the patient, the EMS personnel, the spouse and medical records.   75 year old male with history of CAD/MI, CHF, DVT, HTN, paroxysmal SVT/A. Fib/flutter not on anticoagulation secondary to upper GI bleeds and cirrhosis, EtOH abuse, hep C and hepatocellular carcinoma, substance abuse presenting with stroke symptoms. Last known normal 10:30 PM last night. Symptoms described as left facial droop and left-sided weakness presumably leading to fall. Patient sustained a fall around 3 AM and related on floor, was found by wife this morning around 8:30 and EMS was called. Symptoms constant since onset. Located in LUE, LLE, left face. Associated with decreased sensation to face and neglect, gaze preference to the right, and tachycardia. Patient denies any recent illnesses, fevers, chest pain, palpitations, shortness of breath. He is not anticoagulated or on anti platelets. No hx prior CVA or neurologic deficits. Denies substance use currently, states last ETOH use about a week ago.    Past Medical History:  Diagnosis Date  . Abnormal TSH   . Acute gastric ulcer   . Acute gastritis with hemorrhage   . Alcohol abuse   . Alcohol dependence ([place]) 02/02/2014  . Anemia   . CHF (congestive heart failure) ([place])   . Cirrhosis ([place]) 06/04/2012  . Depressive disorder 02/01/2014  . Dizziness and giddiness 12/13/2014  . DVT of lower limb, acute ([place]) 06/06/2012  . Essential hypertension   . Fatty liver   . Gallstones   . Gastritis 06/07/2012  . GERD (gastroesophageal reflux disease)   . GI bleed due to NSAIDs 12/13/2014  . Granulomatous gastritis   . Hematuria 06/04/2012  . Hepatitis C   . Hepatocellular carcinoma ([place])   . Heroin abuse    "I haven't done that since  I don't know when."  . Heroin overdose 02/20/2014  . LV dysfunction    a. 04/2015: EF 45-50% by cath.  . Neuropathy ([place])   . NSTEMI (non-ST elevated myocardial infarction) ([place])    a. 04/2015 - patent coronaries. Etiology possibly due to coronary spasm versus embolus, stress cardiomyopathy (atypical), and aborted infarction related to plaque rupture with thrombosis and dissolution. Amlodipine started. Not on antiplatelets due to GIB/cirrhosis history.  . Oral thrush 06/05/2012  . Polysubstance abuse    Rare marijuana.  No EtOH x 2 months.   . Prolonged Q-T interval on ECG    a. 12/2014 - treated with magnesium.  . Right knee pain 12/13/2014  . S/P alcohol detoxification 02/02/2014  . SVT (supraventricular tachycardia) ([place])    a. 12/2014 in setting of GIB, ETOH, NSAIDS, gastritis.  . Symptomatic cholelithiasis 12/15/2013  . Thrombocytopenia ([place])   . Upper GI bleeding 12/13/2014  . Weight loss 06/04/2012    Patient Active Problem List   Diagnosis Date Noted  . CVA (cerebral infarction) 06/25/2016  . Stroke (cerebrum) ([place]) 06/25/2016  . Hypomagnesemia 06/09/2016  . Acute on chronic diastolic CHF (congestive heart failure) ([place]) 06/09/2016  . Alcoholic cirrhosis of liver with ascites ([place])   . Other specified hypothyroidism   . Hepatocellular carcinoma ([place])   . Paroxysmal atrial fibrillation (H[facility])   . Alcohol withdrawal ([place]) 05/27/2016  . Generalized weakness 05/27/2016  . Hypokalemia 05/27/2016  . Dehydration 05/27/2016  . Atrial fibrillation with  rapid ventricular response ([place]) 04/28/2016  . Diarrhea 04/28/2016  . SIRS (systemic inflammatory response syndrome) ([place]) 04/19/2016  . Alcohol use disorder, moderate, in controlled environment, dependence ([place])   . Lactic acidosis   . Tachycardia   . Atrial fibrillation with RVR ([place]) 04/18/2016  . Chest pain 04/18/2016  . Hypotension 04/18/2016  . Elevated lactic acid level 04/18/2016  . Atrial flutter with rapid ventricular response  ([place]) 04/18/2016  . Chronic atrial fibrillation ([place]) 04/18/2016  . SVT (supraventricular tachycardia) ([place])   . Prolonged Q-T interval on ECG   . Neuropathy ([place])   . Essential hypertension   . Thrombocytopenia ([place]) 04/16/2015  . Abnormal TSH 04/16/2015  . NSTEMI (non-ST elevated myocardial infarction) ([place])   . Acute gastric ulcer   . Acute gastritis with hemorrhage   . GI bleed due to NSAIDs 12/13/2014  . Right knee pain 12/13/2014  . Dizziness and giddiness 12/13/2014  . Upper GI bleeding 12/13/2014  . Heroin overdose 02/20/2014  . S/P alcohol detoxification 02/02/2014  . Alcohol dependence ([place]) 02/02/2014  . Depressive disorder 02/01/2014  . Symptomatic cholelithiasis 12/15/2013  . Abdominal pain 12/14/2013  . Gastritis 06/07/2012  . DVT of lower limb, acute ([place]) 06/06/2012  . Anemia 06/06/2012  . Oral thrush 06/05/2012  . Alcohol abuse 06/04/2012  . Weight loss 06/04/2012  . 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 Cirrhosis ([place]) 06/04/2012  . Hematuria 06/04/2012    Past Surgical History:  Procedure Laterality Date  . CARDIAC CATHETERIZATION N/A 04/15/2015   Procedure: Left Heart Cath and Coronary Angiography;  Surgeon: Belva Crome, MD;  Location: [facility];  Service: Cardiovascular;  Laterality: N/A;  . CIRCUMCISION    . ESOPHAGOGASTRODUODENOSCOPY  06/07/2012   Procedure: ESOPHAGOGASTRODUODENOSCOPY (EGD);  Surgeon: Milus Banister, MD;  Location: [facility];  Service: Endoscopy;  Laterality: N/A;  may need treatment of varices  . ESOPHAGOGASTRODUODENOSCOPY N/A 12/14/2014   Procedure: ESOPHAGOGASTRODUODENOSCOPY (EGD);  Surgeon: Jerene Bears, MD;  Location[facility];  Service: Endoscopy;  Laterality: N/A;  . IR GENERIC HISTORICAL  06/25/2016   IR US GUIDE VASC ACCESS LEFT 06/25/2016 Corrie Mckusick, DO [facility]  . IR GENERIC HISTORICAL  06/25/2016   IR RADIOLOGY PERIPHERAL GUIDED IV START 06/25/2016 Corrie Mckusick, DO  [facility]  . TONSILLECTOMY         Home Medications    Prior to Admission medications   Medication Sig Start Date End Date Taking? Authorizing Provider  aspirin EC 325 MG tablet Take 1 tablet (325 mg total) by mouth daily. 06/16/16  Yes Florencia Reasons, MD  Cholecalciferol (VITAMIN D PO) Take 1 tablet by mouth daily.   Yes Historical Provider, MD  folic acid (FOLVITE) 1 MG tablet Take 1 tablet (1 mg total) by mouth daily. 04/21/16  Yes Maryann Mikhail, DO  furosemide (LASIX) 40 MG tablet Take 1 tablet (40 mg total) by mouth daily. 06/16/16  Yes Florencia Reasons, MD  levothyroxine (SYNTHROID, LEVOTHROID) 50 MCG tablet Take 1 tablet (50 mcg total) by mouth daily before breakfast. 06/16/16  Yes Florencia Reasons, MD  magnesium oxide (MAG-OX) 400 (241.3 Mg) MG tablet Take 1 tablet (400 mg total) by mouth daily. 06/03/16  Yes Verlee Monte, MD  metoprolol tartrate (LOPRESSOR) 25 MG tablet Take 1 tablet (25 mg total) by mouth 3 (three) times daily. 06/16/16  Yes Florencia Reasons, MD  Multiple Vitamin (MULTIVITAMIN WITH MINERALS) TABS tablet Take 1 tablet by mouth daily. 04/21/16  Yes Maryann  Mikhail, DO  potassium chloride (K-DUR) 10 MEQ tablet Take 2 tablets (20 mEq total) by mouth daily. 06/16/16  Yes Florencia Reasons, MD  spironolactone (ALDACTONE) 25 MG tablet Take 0.5 tablets (12.5 mg total) by mouth daily. 06/16/16  Yes Florencia Reasons, MD  sucralfate (CARAFATE) 1 g tablet Take 1 tablet (1 g total) by mouth 2 (two) times daily. 06/16/16  Yes Florencia Reasons, MD  zolpidem (AMBIEN) 5 MG tablet Take 1 tablet (5 mg total) by mouth at bedtime as needed for sleep. 06/15/16  Yes Florencia Reasons, MD  lipase/protease/amylase (CREON) 12000 units CPEP capsule Take 1 capsule (12,000 Units total) by mouth 3 (three) times daily with meals. Patient not taking: Reported on 06/25/2016 06/16/16   Florencia Reasons, MD  saccharomyces boulardii (FLORASTOR) 250 MG capsule Take 1 capsule (250 mg total) by mouth 2 (two) times daily. Patient not taking: Reported on 06/25/2016 06/03/16   Verlee Monte, MD   thiamine 100 MG tablet Take 1 tablet (100 mg total) by mouth daily. Patient not taking: Reported on 05/27/2016 04/21/16   Cristal Ford, DO    Family History Family History  Problem Relation Age of Onset  . Stroke Father   . Prostate cancer Brother   . Heart disease Mother     Pacemaker    Social History Social History  Substance Use Topics  . Smoking status: Former Smoker    Packs/day: 0.50    Years: 5.00    Types: Cigarettes  . Smokeless tobacco: Never Used     Comment: "quit smoking cigarettes in the 1970's"  . Alcohol use 16.8 oz/week    28 Glasses of wine per week     Comment: 06/2016 " I quit drinking a couple of weeks ago "     Allergies   Review of patient's allergies indicates no known allergies.   Review of Systems Review of Systems  Constitutional: Negative for diaphoresis and fever.  Eyes: Negative for visual disturbance.  Respiratory: Negative for cough and shortness of breath.   Cardiovascular: Negative for chest pain, palpitations and leg swelling.  Gastrointestinal: Negative for abdominal pain, nausea and vomiting.  Genitourinary: Negative for decreased urine volume.  Skin: Negative for rash.  Neurological: Positive for tremors, facial asymmetry, weakness and numbness. Negative for seizures and headaches.  Hematological: Does not bruise/bleed easily.  All other systems reviewed and are negative.   Physical Exam Updated Vital Signs BP 137/96 (BP Location: Left Arm)   Pulse 82   Resp 20   Ht 5\' 10"  (1.778 m)   Wt 91.5 kg   SpO2 99%   BMI 28.94 kg/m   Physical Exam  Constitutional: He is oriented to person, place, and time. He appears well-developed and well-nourished.  HENT:  Head: Normocephalic and atraumatic.  Eyes: Conjunctivae are normal.  Neck: Neck supple.  Cardiovascular: An irregular rhythm present. Tachycardia present.   Pulmonary/Chest: Effort normal and breath sounds normal. No respiratory distress.  Abdominal: Soft. There is  no tenderness.  Musculoskeletal: He exhibits no edema.  Neurological: He is alert and oriented to person, place, and time. He displays tremor. A cranial nerve deficit (right gaze preference, will not cross midline to look left, decreased sensation to light touch in left face, left mouth droop ) and sensory deficit (facial decreased sensation as above vs neglect of left face, and some diminshed sensation in LUE as well ) is present. GCS eye subscore is 4. GCS verbal subscore is 5. GCS motor subscore is 6.  Unable to  move LUE, drift in LLE with greatly diminished LLE strength (able to start to lift about 15 degrees against gravity only transiently). Tremor noted in RUE with extension of arm, grip intact 5/5.   Skin: Skin is warm and dry.  Psychiatric: He has a normal mood and affect.  Nursing note and vitals reviewed.    ED Treatments / Results  Labs (all labs ordered are listed, but only abnormal results are displayed) Labs Reviewed  PROTIME-INR - Abnormal; Notable for the following:       Result Value   Prothrombin Time 15.8 (*)    All other components within normal limits  COMPREHENSIVE METABOLIC PANEL - Abnormal; Notable for the following:    Chloride 112 (*)    CO2 20 (*)    Glucose, Bld 113 (*)    BUN <5 (*)    Calcium 8.3 (*)    Albumin 2.5 (*)    AST 75 (*)    All other components within normal limits  TSH - Abnormal; Notable for the following:    TSH 14.387 (*)    All other components within normal limits  T4, FREE - Abnormal; Notable for the following:    Free T4 0.59 (*)    All other components within normal limits  CBG MONITORING, ED - Abnormal; Notable for the following:    Glucose-Capillary 108 (*)    All other components within normal limits  I-STAT CHEM 8, ED - Abnormal; Notable for the following:    BUN <3 (*)    Creatinine, Ser 0.60 (*)    Glucose, Bld 111 (*)    Calcium, Ion 1.04 (*)    All other components within normal limits  APTT  CBC  DIFFERENTIAL    I-STAT TROPOININ, ED    EKG  EKG Interpretation None       Radiology Ct Head Wo Contrast  Result Date: 06/25/2016 CLINICAL DATA:  Left-sided weakness and facial droop EXAM: CT HEAD WITHOUT CONTRAST TECHNIQUE: Contiguous axial images were obtained from the base of the skull through the vertex without intravenous contrast. COMPARISON:  02/13/2014 FINDINGS: Brain: Atrophic changes are noted. There are some geographic areas of decreased attenuation identified consistent with prior subacute to chronic ischemia. One is noted within the right frontal lobe somewhat progressed from the prior exam and 1 within the posterior aspect of the left parietal lobe. There is generalized decreased attenuation involving the right temporal lobe and extending into the right parietal lobe the distribution of the right middle cerebral artery. Vascular: Slight increased density is noted within the right MCA distally. Skull: No acute bony abnormality noted. Sinuses/Orbits: No acute finding. Other: None. IMPRESSION: Dense right MCA with generalized decreased attenuation involving the right parietal and temporal lobes consistent with acute ischemia. Areas of subacute to chronic ischemia as described. No acute hemorrhage is noted. Critical Value/emergent results were called by telephone at the time of interpretation on 06/25/2016 at 11:00 am to Dr. Carmin Muskrat , who verbally acknowledged these results. Electronically Signed   By: Inez Catalina M.D.   On: 06/25/2016 11:01   Ir US Guide Vasc Access Left  Result Date: 06/25/2016 INDICATION: 75 year old male with a history of stroke EXAM: Left PICC LINE PLACEMENT WITH ULTRASOUND AND FLUOROSCOPIC GUIDANCE MEDICATIONS: None ANESTHESIA/SEDATION: None FLUOROSCOPY TIME:  None COMPLICATIONS: None immediate. PROCEDURE: The patient was advised of the possible risks and complications and agreed to undergo the procedure. The patient was then brought to the angiographic suite for the  procedure. The  left arm was prepped with chlorhexidine, draped in the usual sterile fashion using maximum barrier technique (cap and mask, sterile gown, sterile gloves, large sterile sheet, hand hygiene and cutaneous antisepsis) and infiltrated locally with 1% Lidocaine. Ultrasound demonstrated patency of the left basilic vein, and this was documented with an image. Under real-time ultrasound guidance, this vein was accessed with a 21 gauge micropuncture needle and image documentation was performed. A 0.018 wire was introduced in to the vein. Over this, a 4 [place] single lumen micro puncture was placed. IMPRESSION: Status post ultrasound-guided venipuncture of the left basilic vein. Signed, Dulcy Fanny. Earleen Newport, DO [facility] Radiology Electronically Signed   By: Corrie Mckusick D.O.   On: 06/25/2016 14:21   Ct Cerebral Perfusion W Contrast  Result Date: 06/25/2016 CLINICAL DATA:  Stroke. EXAM: CT CEREBRAL PERFUSION WITH CONTRAST TECHNIQUE: CT perfusion was performed with RAPID software CONTRAST:  40 mL Isovue 370 IV COMPARISON:  CT head 06/25/2016 FINDINGS: Occlusion right M1. Acute infarct right MCA territory with low-density in the right temporal parietal lobe. Left middle cerebral artery patent. Core infarct volume based on cerebral blood flow 30% of normal is 55 mL Penumbra volume based on T max greater than 6 seconds  152 mL IMPRESSION: Right MCA infarct.  Core infarct 55 mL.  Penumbra 152 mL These results were called by telephone at the time of interpretation on 06/25/2016 at 1:45 pm to Dr. Norman Clay , who verbally acknowledged these results. Electronically Signed   By: Franchot Gallo M.D.   On: 06/25/2016 13:53   Ir Radiology Peripheral Guided Iv Start  Result Date: 06/25/2016 INDICATION: 75 year old male with a history of stroke EXAM: Left PICC LINE PLACEMENT WITH ULTRASOUND AND FLUOROSCOPIC GUIDANCE MEDICATIONS: None ANESTHESIA/SEDATION: None  FLUOROSCOPY TIME:  None COMPLICATIONS: None immediate. PROCEDURE: The patient was advised of the possible risks and complications and agreed to undergo the procedure. The patient was then brought to the angiographic suite for the procedure. The left arm was prepped with chlorhexidine, draped in the usual sterile fashion using maximum barrier technique (cap and mask, sterile gown, sterile gloves, large sterile sheet, hand hygiene and cutaneous antisepsis) and infiltrated locally with 1% Lidocaine. Ultrasound demonstrated patency of the left basilic vein, and this was documented with an image. Under real-time ultrasound guidance, this vein was accessed with a 21 gauge micropuncture needle and image documentation was performed. A 0.018 wire was introduced in to the vein. Over this, a 4 [place] single lumen micro puncture was placed. IMPRESSION: Status post ultrasound-guided venipuncture of the left basilic vein. Signed, Dulcy Fanny. Earleen Newport, DO [facility] Radiology Electronically Signed   By: Corrie Mckusick D.O.   On: 06/25/2016 14:21    Procedures Procedures (including critical care time)  Medications Ordered in ED Medications  diltiazem (CARDIZEM) 1 mg/mL load via infusion 10 mg (0 mg Intravenous Hold 06/25/16 1252)    And  diltiazem (CARDIZEM) 100 mg in dextrose 5% 162mL (1 mg/mL) infusion (0 mg/hr Intravenous Hold 06/25/16 1252)  lidocaine (XYLOCAINE) 1 % (with pres) injection (not administered)   stroke: mapping our early stages of recovery book (not administered)  0.9 %  sodium chloride infusion (not administered)  iopamidol (ISOVUE-300) 61 % injection (not administered)  lidocaine (XYLOCAINE) 1 % (with pres) injection (not administered)  ceFAZolin (ANCEF) 2-4 GM/100ML-% IVPB (not administered)  eptifibatide (INTEGRILIN) 20 MG/10ML injection (not administered)  nitroGLYCERIN 100 MCG/ML intra-arterial injection (not administered)  oxyCODONE (Oxy  IR/ROXICODONE)  immediate release tablet 5 mg (not administered)    Or  oxyCODONE (ROXICODONE) 5 MG/5ML solution 5 mg (not administered)  fentaNYL (SUBLIMAZE) injection 25-50 mcg (not administered)  eptifibatide (INTEGRILIN) injection (1.8 mg Intravenous Given 06/25/16 1531)  iopamidol (ISOVUE-370) 76 % injection 40 mL (40 mLs Intravenous Contrast Given 06/25/16 1321)     Initial Impression / Assessment and Plan / ED Course  I have reviewed the triage vital signs and the nursing notes.  Pertinent labs & imaging results that were available during my care of the patient were reviewed by me and considered in my medical decision making (see chart for details).  Clinical Course    75 year old male with history of CAD/MI, CHF, DVT, HTN, paroxysmal SVT/A. Fib/flutter not on anticoagulation secondary to upper GI bleeds and cirrhosis, EtOH abuse, hep C and hepatocellular carcinoma, substance abuse presenting with stroke symptoms, as above. He is afebrile with stable vital signs. Alert and oriented. Protecting airway. Exam findings consistent with CVA, but given last known normal time was 10:30 last night, no Code Stroke was called on this patient.   CT head notable for MCA infarct. Lab work unremarkable other than elevated TSH and low free T4, this is improving from prior values. Neurology consultation. Requested CT angiogram. After this is obtained, patient was taken to IR for intervention with neurology for new MCA CVA.   Case discussed with Dr. Vanita Panda who oversaw management of this patient.   Final Clinical Impressions(s) / ED Diagnoses   Final diagnoses:  Stroke-like symptoms  Stroke [facility])  CVA (cerebral infarction)  Cerebrovascular accident (CVA) due to occlusion of precerebral artery [facility])    New Prescriptions New Prescriptions   No medications on file     Ivin Booty, MD 06/25/16 [place]    Carmin Muskrat, MD 06/25/16 1643

## 2016-06-25 NOTE — Progress Notes (Signed)
RT Note: Pt transported from CT to 3M08 on vent, no complications.

## 2016-06-25 NOTE — Consult Note (Signed)
Requesting Physician: Dr. Vanita Panda    Chief Complaint: R MCA stroke  History obtained from:  chart  HPI:                                                                                                                                         Nathan Frank is an 75 y.o. male with hx as below presents after falling in the middle of the night and having L sided weakness and L facial droop along with dysarthria   Date last known well: 06/24/16 Time last known well: 11pm  tPA Given: No: wake up stroke   Past Medical History:  Diagnosis Date  . Abnormal TSH   . Acute gastric ulcer   . Acute gastritis with hemorrhage   . Alcohol abuse   . Alcohol dependence ([place]) 02/02/2014  . Anemia   . CHF (congestive heart failure) ([place])   . Cirrhosis ([place]) 06/04/2012  . Depressive disorder 02/01/2014  . Dizziness and giddiness 12/13/2014  . DVT of lower limb, acute ([place]) 06/06/2012  . Essential hypertension   . Fatty liver   . Gallstones   . Gastritis 06/07/2012  . GERD (gastroesophageal reflux disease)   . GI bleed due to NSAIDs 12/13/2014  . Granulomatous gastritis   . Hematuria 06/04/2012  . Hepatitis C   . Hepatocellular carcinoma ([place])   . Heroin abuse    "I haven't done that since I don't know when."  . Heroin overdose 02/20/2014  . LV dysfunction    a. 04/2015: EF 45-50% by cath.  . Neuropathy ([place])   . NSTEMI (non-ST elevated myocardial infarction) ([place])    a. 04/2015 - patent coronaries. Etiology possibly due to coronary spasm versus embolus, stress cardiomyopathy (atypical), and aborted infarction related to plaque rupture with thrombosis and dissolution. Amlodipine started. Not on antiplatelets due to GIB/cirrhosis history.  . Oral thrush 06/05/2012  . Polysubstance abuse    Rare marijuana.  No EtOH x 2 months.   . Prolonged Q-T interval on ECG    a. 12/2014 - treated with magnesium.  . Right knee pain 12/13/2014  . S/P alcohol detoxification 02/02/2014  . SVT (supraventricular tachycardia)  ([place])    a. 12/2014 in setting of GIB, ETOH, NSAIDS, gastritis.  . Symptomatic cholelithiasis 12/15/2013  . Thrombocytopenia ([place])   . Upper GI bleeding 12/13/2014  . Weight loss 06/04/2012    Past Surgical History:  Procedure Laterality Date  . CARDIAC CATHETERIZATION N/A 04/15/2015   Procedure: Left Heart Cath and Coronary Angiography;  Surgeon: Belva Crome, MD;  Location: [facility];  Service: Cardiovascular;  Laterality: N/A;  . CIRCUMCISION    . ESOPHAGOGASTRODUODENOSCOPY  06/07/2012   Procedure: ESOPHAGOGASTRODUODENOSCOPY (EGD);  Surgeon: Milus Banister, MD;  Location: [facility];  Service: Endoscopy;  Laterality: N/A;  may need treatment of varices  . ESOPHAGOGASTRODUODENOSCOPY N/A 12/14/2014   Procedure: ESOPHAGOGASTRODUODENOSCOPY (EGD);  Surgeon: Jerene Bears, MD;  Location: [facility];  Service: Endoscopy;  Laterality: N/A;  . TONSILLECTOMY      Family History  Problem Relation Age of Onset  . Stroke Father   . Prostate cancer Brother   . Heart disease Mother     Pacemaker   Social History:  reports that he has quit smoking. His smoking use included Cigarettes. He has a 2.50 pack-year smoking history. He has never used smokeless tobacco. He reports that he drinks about 16.8 oz of alcohol per week . He reports that he does not use drugs.  Allergies: No Known Allergies  Medications:                                                                                                                           I have reviewed the patient's current medications.  ROS:                                                                                                                                       History obtained from chart review  General ROS: negative for - chills, fatigue, fever, night sweats, weight gain or weight loss Psychological ROS: negative for - behavioral disorder, hallucinations, memory difficulties, mood swings or suicidal ideation Ophthalmic ROS:  negative for - blurry vision, double vision, eye pain or loss of vision ENT ROS: negative for - epistaxis, nasal discharge, oral lesions, sore throat, tinnitus or vertigo Allergy and Immunology ROS: negative for - hives or itchy/watery eyes Hematological and Lymphatic ROS: negative for - bleeding problems, bruising or swollen lymph nodes Endocrine ROS: negative for - galactorrhea, hair pattern changes, polydipsia/polyuria or temperature intolerance Respiratory ROS: negative for - cough, hemoptysis, shortness of breath or wheezing Cardiovascular ROS: negative for - chest pain, dyspnea on exertion, edema or irregular heartbeat Gastrointestinal ROS: negative for - abdominal pain, diarrhea, hematemesis, nausea/vomiting or stool incontinence Genito-Urinary ROS: negative for - dysuria, hematuria, incontinence or urinary frequency/urgency Musculoskeletal ROS: negative for - joint swelling or muscular weakness Neurological ROS: as noted in HPI Dermatological ROS: negative for rash and skin lesion changes  Neurologic Examination:  Blood pressure 121/83, pulse 89, resp. rate 22, height 5\' 10"  (1.778 m), weight 91.5 kg (201 lb 11.5 oz), SpO2 99 %.  HEENT-  Normocephalic, no lesions, without obvious abnormality.  Normal external eye and conjunctiva.  Normal TM's bilaterally.  Normal auditory canals and external ears. Normal external nose, mucus membranes and septum.  Normal pharynx. Cardiovascular- regular rate and rhythm, S1, S2 normal, no murmur, click, rub or gallop, pulses palpable throughout   Lungs- chest clear, no wheezing, rales, normal symmetric air entry, Heart exam - S1, S2 normal, no murmur, no gallop, rate regular Abdomen- soft, non-tender; bowel sounds normal; no masses,  no organomegaly   Neurological Examination Mental Status: Awake, alert and oriented Moderate dysarthria  L dense  facial R gaze preference  L arm 0/5 L leg 2/5       Lab Results: Basic Metabolic Panel:  Recent Labs Lab 06/25/16 0923  NA 141  K 3.9  CL 112*  CO2 20*  GLUCOSE 113*  BUN <5*  CREATININE 0.81  CALCIUM 8.3*    Liver Function Tests:  Recent Labs Lab 06/25/16 0923  AST 75*  ALT 39  ALKPHOS 78  BILITOT 1.0  PROT 7.1  ALBUMIN 2.5*   No results for input(s): LIPASE, AMYLASE in the last 168 hours. No results for input(s): AMMONIA in the last 168 hours.  CBC:  Recent Labs Lab 06/25/16 0923  WBC 4.9  NEUTROABS 2.9  HGB 14.1  HCT 42.2  MCV 98.8  PLT 222    Cardiac Enzymes: No results for input(s): CKTOTAL, CKMB, CKMBINDEX, TROPONINI in the last 168 hours.  Lipid Panel: No results for input(s): CHOL, TRIG, HDL, CHOLHDL, VLDL, LDLCALC in the last 168 hours.  CBG:  Recent Labs Lab 06/25/16 1010  GLUCAP 108*    Microbiology: Results for orders placed or performed during the hospital encounter of 06/09/16  Urine culture     Status: Abnormal   Collection Time: 06/09/16  8:58 PM  Result Value Ref Range Status   Specimen Description URINE, CLEAN CATCH  Final   Special Requests NONE  Final   Culture <10,000 COLONIES/mL INSIGNIFICANT GROWTH (A)  Final   Report Status 06/11/2016 FINAL  Final  MRSA PCR Screening     Status: None   Collection Time: 06/09/16 10:30 PM  Result Value Ref Range Status   MRSA by PCR NEGATIVE NEGATIVE Final    Comment:        The GeneXpert MRSA Assay (FDA approved for NASAL specimens only), is one component of a comprehensive MRSA colonization surveillance program. It is not intended to diagnose MRSA infection nor to guide or monitor treatment for MRSA infections.   Gastrointestinal Panel by PCR , Stool     Status: None   Collection Time: 06/11/16 11:00 AM  Result Value Ref Range Status   Campylobacter species NOT DETECTED NOT DETECTED Final   Plesimonas shigelloides NOT DETECTED NOT DETECTED Final   Salmonella  species NOT DETECTED NOT DETECTED Final   Yersinia enterocolitica NOT DETECTED NOT DETECTED Final   Vibrio species NOT DETECTED NOT DETECTED Final   Vibrio cholerae NOT DETECTED NOT DETECTED Final   Enteroaggregative E coli (EAEC) NOT DETECTED NOT DETECTED Final   Enteropathogenic E coli (EPEC) NOT DETECTED NOT DETECTED Final   Enterotoxigenic E coli (ETEC) NOT DETECTED NOT DETECTED Final   Shiga like toxin producing E coli (STEC) NOT DETECTED NOT DETECTED Final   Shigella/Enteroinvasive E coli (EIEC) NOT DETECTED NOT DETECTED Final   Cryptosporidium  NOT DETECTED NOT DETECTED Final   Cyclospora cayetanensis NOT DETECTED NOT DETECTED Final   Entamoeba histolytica NOT DETECTED NOT DETECTED Final   Giardia lamblia NOT DETECTED NOT DETECTED Final   Adenovirus F40/41 NOT DETECTED NOT DETECTED Final   Astrovirus NOT DETECTED NOT DETECTED Final   Norovirus GI/GII NOT DETECTED NOT DETECTED Final   Rotavirus A NOT DETECTED NOT DETECTED Final   Sapovirus (I, II, IV, and V) NOT DETECTED NOT DETECTED Final    Coagulation Studies:  Recent Labs  06/25/16 0923  LABPROT 15.8*  INR 1.25    Imaging: Ct Head Wo Contrast  Result Date: 06/25/2016 CLINICAL DATA:  Left-sided weakness and facial droop EXAM: CT HEAD WITHOUT CONTRAST TECHNIQUE: Contiguous axial images were obtained from the base of the skull through the vertex without intravenous contrast. COMPARISON:  02/13/2014 FINDINGS: Brain: Atrophic changes are noted. There are some geographic areas of decreased attenuation identified consistent with prior subacute to chronic ischemia. One is noted within the right frontal lobe somewhat progressed from the prior exam and 1 within the posterior aspect of the left parietal lobe. There is generalized decreased attenuation involving the right temporal lobe and extending into the right parietal lobe the distribution of the right middle cerebral artery. Vascular: Slight increased density is noted within the  right MCA distally. Skull: No acute bony abnormality noted. Sinuses/Orbits: No acute finding. Other: None. IMPRESSION: Dense right MCA with generalized decreased attenuation involving the right parietal and temporal lobes consistent with acute ischemia. Areas of subacute to chronic ischemia as described. No acute hemorrhage is noted. Critical Value/emergent results were called by telephone at the time of interpretation on 06/25/2016 at 11:00 am to Dr. Carmin Muskrat , who verbally acknowledged these results. Electronically Signed   By: Inez Catalina M.D.   On: 06/25/2016 11:01         Assessment: 75 y.o. male with hx as below presents after falling in the middle of the night and having L sided weakness and L facial droop along with dysarthria   Wake up stroke  CTH- shows R temporal stroke with R dense MCA  STAT CT perfusion for possible IR  Stroke Risk Factors - hypercoagulable state and hypertension

## 2016-06-25 NOTE — Procedures (Signed)
Interventional Radiology Procedure Note  Procedure: Placement of a left basilic vein 23F micropuncture for IV access.    Complications:  none Recommendations:  - Ok to use - Do not submerge  - Routine care   Signed,  Dulcy Fanny. Earleen Newport, DO

## 2016-06-25 NOTE — ED Notes (Signed)
Failed IV attempts x2 RNs.  Ultrasound to be utilized.

## 2016-06-25 NOTE — ED Notes (Signed)
Neuro MD paged and made aware of inability to initiate IV.  Will continue to try and obtain appropriate access for scan.

## 2016-06-25 NOTE — Consult Note (Signed)
PULMONARY / CRITICAL CARE MEDICINE   Name: Nathan Frank MRN: [id number] DOB: 02/17/1941    ADMISSION DATE:  06/25/2016 CONSULTATION DATE:  9/21  REFERRING MD:   Cristobal Goldmann   CHIEF COMPLAINT: vent management   HISTORY OF PRESENT ILLNESS:   This is a 75 year old male w/ sig medical h/o: CAD, CHF (EF 40-45%), GIB, Cirrhosis, Hep C and hepatocellular carcinoma, thrombocytopenia and ETOH abuse. Admitted to the ER on 9/21 after being found on floor w/ left sided facial droop, left sided weakness and dysarthria. He was last seen in [facility] around 2200 the evening prior. In ER he was evaluated by stroke team, dx eval c/w right MCA infarct. He was transferred to interventional radiology where he underwent thrombectomy of the R MCA clot. He returned to the ICU on full vent support. PCCM asked to assist w/ care.    PAST MEDICAL HISTORY :  He  has a past medical history of Abnormal TSH; Acute gastric ulcer; Acute gastritis with hemorrhage; Alcohol abuse; Alcohol dependence ([place]) (02/02/2014); Anemia; CHF (congestive heart failure) ([place]); Cirrhosis ([place]) (06/04/2012); Depressive disorder (02/01/2014); Dizziness and giddiness (12/13/2014); DVT of lower limb, acute ([place]) (06/06/2012); Essential hypertension; Fatty liver; Gallstones; Gastritis (06/07/2012); GERD (gastroesophageal reflux disease); GI bleed due to NSAIDs (12/13/2014); Granulomatous gastritis; Hematuria (06/04/2012); Hepatitis C; Hepatocellular carcinoma ([place]); Heroin abuse; Heroin overdose (02/20/2014); LV dysfunction; Neuropathy ([place]); NSTEMI (non-ST elevated myocardial infarction) ([place]); Oral thrush (06/05/2012); Polysubstance abuse; Prolonged Q-T interval on ECG; Right knee pain (12/13/2014); S/P alcohol detoxification (02/02/2014); SVT (supraventricular tachycardia) ([place]); Symptomatic cholelithiasis (12/15/2013); Thrombocytopenia ([place]); Upper GI bleeding (12/13/2014); and Weight loss (06/04/2012).  PAST SURGICAL HISTORY: He  has a past surgical history that  includes Esophagogastroduodenoscopy (06/07/2012); Tonsillectomy; Esophagogastroduodenoscopy (N/A, 12/14/2014); Circumcision; Cardiac catheterization (N/A, 04/15/2015); ir generic historical (06/25/2016); and ir generic historical (06/25/2016).  No Known Allergies  No current facility-administered medications on file prior to encounter.    Current Outpatient Prescriptions on File Prior to Encounter  Medication Sig  . aspirin EC 325 MG tablet Take 1 tablet (325 mg total) by mouth daily.  . Cholecalciferol (VITAMIN D PO) Take 1 tablet by mouth daily.  . folic acid (FOLVITE) 1 MG tablet Take 1 tablet (1 mg total) by mouth daily.  . furosemide (LASIX) 40 MG tablet Take 1 tablet (40 mg total) by mouth daily.  Marland Kitchen levothyroxine (SYNTHROID, LEVOTHROID) 50 MCG tablet Take 1 tablet (50 mcg total) by mouth daily before breakfast.  . magnesium oxide (MAG-OX) 400 (241.3 Mg) MG tablet Take 1 tablet (400 mg total) by mouth daily.  . metoprolol tartrate (LOPRESSOR) 25 MG tablet Take 1 tablet (25 mg total) by mouth 3 (three) times daily.  . Multiple Vitamin (MULTIVITAMIN WITH MINERALS) TABS tablet Take 1 tablet by mouth daily.  . potassium chloride (K-DUR) 10 MEQ tablet Take 2 tablets (20 mEq total) by mouth daily.  Marland Kitchen spironolactone (ALDACTONE) 25 MG tablet Take 0.5 tablets (12.5 mg total) by mouth daily.  . sucralfate (CARAFATE) 1 g tablet Take 1 tablet (1 g total) by mouth 2 (two) times daily.  Marland Kitchen zolpidem (AMBIEN) 5 MG tablet Take 1 tablet (5 mg total) by mouth at bedtime as needed for sleep.  Marland Kitchen lipase/protease/amylase (CREON) 12000 units CPEP capsule Take 1 capsule (12,000 Units total) by mouth 3 (three) times daily with meals. (Patient not taking: Reported on 06/25/2016)  . saccharomyces boulardii (FLORASTOR) 250 MG capsule Take 1 capsule (250 mg total) by mouth 2 (two) times daily. (Patient not taking: Reported on  06/25/2016)  . thiamine 100 MG tablet Take 1 tablet (100 mg total) by mouth daily. (Patient not taking:  Reported on 05/27/2016)    FAMILY HISTORY:  His indicated that the status of his mother is unknown. He indicated that his father is deceased. He indicated that his brother is deceased. He indicated that his maternal grandmother is deceased. He indicated that his maternal grandfather is deceased. He indicated that his paternal grandmother is deceased. He indicated that his paternal grandfather is deceased.    SOCIAL HISTORY: He  reports that he has quit smoking. His smoking use included Cigarettes. He has a 2.50 pack-year smoking history. He has never used smokeless tobacco. He reports that he drinks about 16.8 oz of alcohol per week . He reports that he does not use drugs.  REVIEW OF SYSTEMS:  sedated  SUBJECTIVE:  Cannot obtain due to intubation  VITAL SIGNS: BP 137/96 (BP Location: Left Arm)   Pulse 82   Resp 20   Ht 5\' 10"  (1.778 m)   Wt 201 lb 11.5 oz (91.5 kg)   SpO2 99%   BMI 28.94 kg/m   HEMODYNAMICS:    VENTILATOR SETTINGS:    INTAKE / OUTPUT: No intake/output data recorded.  PHYSICAL EXAMINATION: General:  Sedated on vent Neuro:  Sedated, paralyzed on vent HEENT:  NCAT ETT in place Cardiovascular:  RRR, no mgr Lungs:  CTA B with vent supported breaths Abdomen:  Soft, nontender Musculoskeletal:  Normal bulk and tone Skin:  No rash or skin breakdown  LABS:  BMET  Recent Labs Lab 06/25/16 0923 06/25/16 1241  NA 141 144  K 3.9 4.1  CL 112* 110  CO2 20*  --   BUN <5* <3*  CREATININE 0.81 0.60*  GLUCOSE 113* 111*    Electrolytes  Recent Labs Lab 06/25/16 0923  CALCIUM 8.3*    CBC  Recent Labs Lab 06/25/16 0923 06/25/16 1241  WBC 4.9  --   HGB 14.1 13.6  HCT 42.2 40.0  PLT 222  --     Coag's  Recent Labs Lab 06/25/16 0923  APTT 27  INR 1.25    Sepsis Markers No results for input(s): LATICACIDVEN, PROCALCITON, O2SATVEN in the last 168 hours.  ABG No results for input(s): PHART, PCO2ART, PO2ART in the last 168  hours.  Liver Enzymes  Recent Labs Lab 06/25/16 0923  AST 75*  ALT 39  ALKPHOS 78  BILITOT 1.0  ALBUMIN 2.5*    Cardiac Enzymes No results for input(s): TROPONINI, PROBNP in the last 168 hours.  Glucose  Recent Labs Lab 06/25/16 1010  GLUCAP 108*    Imaging Ct Head Wo Contrast  Result Date: 06/25/2016 CLINICAL DATA:  Left-sided weakness and facial droop EXAM: CT HEAD WITHOUT CONTRAST TECHNIQUE: Contiguous axial images were obtained from the base of the skull through the vertex without intravenous contrast. COMPARISON:  02/13/2014 FINDINGS: Brain: Atrophic changes are noted. There are some geographic areas of decreased attenuation identified consistent with prior subacute to chronic ischemia. One is noted within the right frontal lobe somewhat progressed from the prior exam and 1 within the posterior aspect of the left parietal lobe. There is generalized decreased attenuation involving the right temporal lobe and extending into the right parietal lobe the distribution of the right middle cerebral artery. Vascular: Slight increased density is noted within the right MCA distally. Skull: No acute bony abnormality noted. Sinuses/Orbits: No acute finding. Other: None. IMPRESSION: Dense right MCA with generalized decreased attenuation involving the right parietal  and temporal lobes consistent with acute ischemia. Areas of subacute to chronic ischemia as described. No acute hemorrhage is noted. Critical Value/emergent results were called by telephone at the time of interpretation on 06/25/2016 at 11:00 am to Dr. Carmin Muskrat , who verbally acknowledged these results. Electronically Signed   By: Inez Catalina M.D.   On: 06/25/2016 11:01   Ir US Guide Vasc Access Left  Result Date: 06/25/2016 INDICATION: 75 year old male with a history of stroke EXAM: Left PICC LINE PLACEMENT WITH ULTRASOUND AND FLUOROSCOPIC GUIDANCE MEDICATIONS: None ANESTHESIA/SEDATION: None FLUOROSCOPY TIME:  None  COMPLICATIONS: None immediate. PROCEDURE: The patient was advised of the possible risks and complications and agreed to undergo the procedure. The patient was then brought to the angiographic suite for the procedure. The left arm was prepped with chlorhexidine, draped in the usual sterile fashion using maximum barrier technique (cap and mask, sterile gown, sterile gloves, large sterile sheet, hand hygiene and cutaneous antisepsis) and infiltrated locally with 1% Lidocaine. Ultrasound demonstrated patency of the left basilic vein, and this was documented with an image. Under real-time ultrasound guidance, this vein was accessed with a 21 gauge micropuncture needle and image documentation was performed. A 0.018 wire was introduced in to the vein. Over this, a 4 [place] single lumen micro puncture was placed. IMPRESSION: Status post ultrasound-guided venipuncture of the left basilic vein. Signed, Dulcy Fanny. Earleen Newport, DO [facility] Radiology Electronically Signed   By: Corrie Mckusick D.O.   On: 06/25/2016 14:21   Ct Cerebral Perfusion W Contrast  Result Date: 06/25/2016 CLINICAL DATA:  Stroke. EXAM: CT CEREBRAL PERFUSION WITH CONTRAST TECHNIQUE: CT perfusion was performed with RAPID software CONTRAST:  40 mL Isovue 370 IV COMPARISON:  CT head 06/25/2016 FINDINGS: Occlusion right M1. Acute infarct right MCA territory with low-density in the right temporal parietal lobe. Left middle cerebral artery patent. Core infarct volume based on cerebral blood flow 30% of normal is 55 mL Penumbra volume based on T max greater than 6 seconds  152 mL IMPRESSION: Right MCA infarct.  Core infarct 55 mL.  Penumbra 152 mL These results were called by telephone at the time of interpretation on 06/25/2016 at 1:45 pm to Dr. Norman Clay , who verbally acknowledged these results. Electronically Signed   By: Franchot Gallo M.D.   On: 06/25/2016 13:53   Ir Radiology Peripheral Guided Iv  Start  Result Date: 06/25/2016 INDICATION: 75 year old male with a history of stroke EXAM: Left PICC LINE PLACEMENT WITH ULTRASOUND AND FLUOROSCOPIC GUIDANCE MEDICATIONS: None ANESTHESIA/SEDATION: None FLUOROSCOPY TIME:  None COMPLICATIONS: None immediate. PROCEDURE: The patient was advised of the possible risks and complications and agreed to undergo the procedure. The patient was then brought to the angiographic suite for the procedure. The left arm was prepped with chlorhexidine, draped in the usual sterile fashion using maximum barrier technique (cap and mask, sterile gown, sterile gloves, large sterile sheet, hand hygiene and cutaneous antisepsis) and infiltrated locally with 1% Lidocaine. Ultrasound demonstrated patency of the left basilic vein, and this was documented with an image. Under real-time ultrasound guidance, this vein was accessed with a 21 gauge micropuncture needle and image documentation was performed. A 0.018 wire was introduced in to the vein. Over this, a 4 [place] single lumen micro puncture was placed. IMPRESSION: Status post ultrasound-guided venipuncture of the left basilic vein. Signed, Dulcy Fanny. Earleen Newport, DO [facility] Radiology Electronically Signed   By: Corrie Mckusick D.O.   On:  06/25/2016 14:21     STUDIES:  CT brain/perfusion 9/21: Right MCA infarct.    CULTURES:   ANTIBIOTICS:   SIGNIFICANT EVENTS: R MCA thrombectomy per IR 9/21 >   LINES/TUBES: ETT 9/21>>>  DISCUSSION: 75 year old male w/ multiple medical problems. Admitted on 9/12 for large right MCA stroke. Went to IR and underwent mechanical thrombectomy. Returned to the intensive care post-op. PCCM asked to a/w care.  Goal SBP 120-140.  ASSESSMENT / PLAN: NEUROLOGIC A:   Acute Right MCA stroke now s/p IR  P:   RASS goal: 0 to -1 PAD protocol with propofol Neuro eval per stroke team  Primary stroke prophylaxis may be challenging given h/o bleeding  and cirrhosis   PULMONARY A: Ventilator dependence ->intubated for IR P:   Full vent support over night Assess for weaning in am when and if MS will support PAD protocol  PCXR  F/u abg to ensure no hyper/hypocapnea  Titrate FIO2 for sats > 92%  CARDIOVASCULAR A:  H/o CAD H/o systolic CM (EF [id number]) H/o HTN  P:  Goal SBP 120-140 per Neuro IR Use neo to maintain goal Tele Resume ASA when ok w/ neuro  Will hold his home lasix, lopressor and spironolactone for now  RENAL A:   No acute but at risk for AKI from IV contrast load  P:   Cont NS Strict I&O F/u am chemistry   GASTROINTESTINAL A:   H/o Hep C and Cirrhosis  H/o GIB w/ nsaids GERD ? H/o pancreatitis (on Creon at home) P:   H2 blockade IV for now NPO Will need swallow assessment s/p extubation to r/o dysphagia  Cont Creon   HEMATOLOGIC A:   H/o thrombocytopenia (currently WNL) P:  SCDs Anticoagulation per neurology  F/u cbc Careful monitoring if antiplatelet therapy initiated given h/o GIB   INFECTIOUS A:   No acute Issues P:   Trend fever & WBC curve   ENDOCRINE A:   Hypothyroidism  P:   Ck tsh  Cont synthroid.      FAMILY  - Updates: none bedside  - Inter-disciplinary family meet or Palliative Care meeting due by:  Day 7   My cc time 35 minutes  Roselie Awkward, MD [place] PCCM Pager: [phone] Cell: [phone] After 3pm or if no response, call [phone]  06/25/2016, 2:52 PM

## 2016-06-25 NOTE — Anesthesia Preprocedure Evaluation (Signed)
Anesthesia Evaluation  Patient identified by MRN, date of birth, ID band Patient awake    Reviewed: Unable to perform ROS - Chart review onlyPreop documentation limited or incomplete due to emergent nature of procedure.  Airway Mallampati: II   Neck ROM: full    Dental   Pulmonary former smoker,    breath sounds clear to auscultation       Cardiovascular hypertension, + Past MI, + Peripheral Vascular Disease and +CHF  + dysrhythmias Atrial Fibrillation  Rhythm:irregular Rate:Normal     Neuro/Psych PSYCHIATRIC DISORDERS Depression    GI/Hepatic PUD, GERD  ,(+) Hepatitis -, C  Endo/Other  Hypothyroidism   Renal/GU      Musculoskeletal   Abdominal   Peds  Hematology   Anesthesia Other Findings   Reproductive/Obstetrics                             Anesthesia Physical Anesthesia Plan  ASA: IV and emergent  Anesthesia Plan: General   Post-op Pain Management:    Induction: Intravenous  Airway Management Planned: Oral ETT  Additional Equipment: Arterial line  Intra-op Plan:   Post-operative Plan: Possible Post-op intubation/ventilation  Informed Consent: I have reviewed the patients History and Physical, chart, labs and discussed the procedure including the risks, benefits and alternatives for the proposed anesthesia with the patient or authorized representative who has indicated his/her understanding and acceptance.     Plan Discussed with: CRNA, Anesthesiologist and Surgeon  Anesthesia Plan Comments:         Anesthesia Quick Evaluation

## 2016-06-25 NOTE — ED Notes (Signed)
Pt transported to IR by RN and stroke team.

## 2016-06-26 ENCOUNTER — Inpatient Hospital Stay (HOSPITAL_COMMUNITY): Payer: Medicare Other

## 2016-06-26 ENCOUNTER — Encounter (HOSPITAL_COMMUNITY): Payer: Self-pay | Admitting: Interventional Radiology

## 2016-06-26 DIAGNOSIS — I4581 Long QT syndrome: Secondary | ICD-10-CM

## 2016-06-26 LAB — CBC WITH DIFFERENTIAL/PLATELET
Basophils Absolute: 0 10*3/uL (ref 0.0–0.1)
Basophils Relative: 0 %
EOS PCT: 0 %
Eosinophils Absolute: 0 10*3/uL (ref 0.0–0.7)
HCT: 31.3 % — ABNORMAL LOW (ref 39.0–52.0)
HEMOGLOBIN: 10.6 g/dL — AB (ref 13.0–17.0)
LYMPHS ABS: 1 10*3/uL (ref 0.7–4.0)
LYMPHS PCT: 10 %
MCH: 33.3 pg (ref 26.0–34.0)
MCHC: 33.9 g/dL (ref 30.0–36.0)
MCV: 98.4 fL (ref 78.0–100.0)
MONOS PCT: 9 %
Monocytes Absolute: 0.9 10*3/uL (ref 0.1–1.0)
NEUTROS PCT: 80 %
Neutro Abs: 7.8 10*3/uL — ABNORMAL HIGH (ref 1.7–7.7)
Platelets: 184 10*3/uL (ref 150–400)
RBC: 3.18 MIL/uL — AB (ref 4.22–5.81)
RDW: 15.8 % — ABNORMAL HIGH (ref 11.5–15.5)
WBC: 9.7 10*3/uL (ref 4.0–10.5)

## 2016-06-26 LAB — BASIC METABOLIC PANEL
Anion gap: 7 (ref 5–15)
BUN: 5 mg/dL — AB (ref 6–20)
CHLORIDE: 114 mmol/L — AB (ref 101–111)
CO2: 18 mmol/L — AB (ref 22–32)
CREATININE: 0.84 mg/dL (ref 0.61–1.24)
Calcium: 7.4 mg/dL — ABNORMAL LOW (ref 8.9–10.3)
GFR calc Af Amer: >60 mL/min (ref >60)
GFR calc non Af Amer: >60 mL/min (ref >60)
Glucose, Bld: 127 mg/dL — ABNORMAL HIGH (ref 65–99)
POTASSIUM: 4 mmol/L (ref 3.5–5.1)
SODIUM: 139 mmol/L (ref 135–145)

## 2016-06-26 LAB — LIPID PANEL
CHOL/HDL RATIO: 3.3 ratio
Cholesterol: 87 mg/dL (ref 0–200)
HDL: 26 mg/dL — AB (ref >40)
LDL CALC: 38 mg/dL (ref 0–99)
TRIGLYCERIDES: 117 mg/dL (ref <150)
VLDL: 23 mg/dL (ref 0–40)

## 2016-06-26 LAB — HEMOGLOBIN AND HEMATOCRIT, BLOOD
HCT: 31.3 % — ABNORMAL LOW (ref 39.0–52.0)
Hemoglobin: 10.2 g/dL — ABNORMAL LOW (ref 13.0–17.0)

## 2016-06-26 LAB — MAGNESIUM: Magnesium: 1.3 mg/dL — ABNORMAL LOW (ref 1.7–2.4)

## 2016-06-26 MED ORDER — VITAMIN B-1 100 MG PO TABS
100.0000 mg | ORAL_TABLET | Freq: Every day | ORAL | Status: DC
  Administered 2016-06-26 – 2016-07-04 (×8): 100 mg
  Filled 2016-06-26 (×8): qty 1

## 2016-06-26 MED ORDER — ASPIRIN 300 MG RE SUPP
300.0000 mg | Freq: Every day | RECTAL | Status: DC
  Administered 2016-06-26 – 2016-06-29 (×4): 300 mg via RECTAL
  Filled 2016-06-26 (×4): qty 1

## 2016-06-26 MED ORDER — LEVOTHYROXINE SODIUM 50 MCG PO TABS
50.0000 ug | ORAL_TABLET | ORAL | Status: DC
  Administered 2016-06-26 – 2016-07-04 (×7): 50 ug
  Filled 2016-06-26 (×7): qty 1

## 2016-06-26 MED ORDER — MAGNESIUM SULFATE 2 GM/50ML IV SOLN
2.0000 g | Freq: Once | INTRAVENOUS | Status: AC
  Administered 2016-06-26: 2 g via INTRAVENOUS
  Filled 2016-06-26: qty 50

## 2016-06-26 NOTE — Progress Notes (Signed)
PULMONARY / CRITICAL CARE MEDICINE   Name: Nathan Frank MRN: [id number] DOB: Jul 29, 1941    ADMISSION DATE:  06/25/2016 CONSULTATION DATE:  06/25/2016  REFERRING MD:  Cristobal Goldmann / Neuro  CHIEF COMPLAINT: vent management   HISTORY OF PRESENT ILLNESS:   This is a 75 year old male w/ sig medical h/o: CAD, CHF (EF 40-45%), GIB, Cirrhosis, Hep C and hepatocellular carcinoma, thrombocytopenia and ETOH abuse. Admitted to the ER on 9/21 after being found on floor w/ left sided facial droop, left sided weakness and dysarthria. He was last seen in [facility] around 2200 the evening prior. In ER he was evaluated by stroke team, dx eval c/w right MCA infarct. He was transferred to interventional radiology where he underwent thrombectomy of the R MCA clot. He returned to the ICU on full vent support. PCCM asked to assist w/ care.   SUBJECTIVE: No acute events overnight. Sheath pulled this morning.   REVIEW OF SYSTEMS:  Unable to obtain as patient is intubated & sedated.   VITAL SIGNS: BP 123/83   Pulse 88   Temp 100.2 F (37.9 C) (Oral)   Resp 16   Ht 5\' 10"  (1.778 m)   Wt 201 lb 11.5 oz (91.5 kg)   SpO2 100%   BMI 28.94 kg/m   HEMODYNAMICS:    VENTILATOR SETTINGS: Vent Mode: PRVC FiO2 (%):  [40 %-100 %] 40 % Set Rate:  [14 bmp] 14 bmp Vt Set:  [580 mL] 580 mL PEEP:  [5 cmH20] 5 cmH20 Plateau Pressure:  [12 cmH20-18 cmH20] 13 cmH20  INTAKE / OUTPUT: I/O last 3 completed shifts: In: 2206.5 [I.V.:2126.5; NG/GT:80] Out: 825 [Urine:425; Blood:400]  PHYSICAL EXAMINATION: General:  No distress. Eyes closed. No family at bedside. Neuro:  Sedated. Pupils symmetric. Questionable pathologic reflexes in lower extremities. HEENT:  OETT in place. Moist mucus membranes.  Cardiovascular:  Regular rate. No edema. No appreciable JVD. Lungs:  Clear to auscultation bilaterally. Symmetric chest wall rise on ventilator. Abdomen:  Soft. Nondistended. Hypoactive bowel sounds. Musculoskeletal:  No joint  deformity or effusion appreciated. Skin:  Warm and dry. No rash on exposed skin.  LABS:  BMET  Recent Labs Lab 06/25/16 0923 06/25/16 1241 06/26/16 0500  NA 141 144 139  K 3.9 4.1 4.0  CL 112* 110 114*  CO2 20*  --  18*  BUN <5* <3* 5*  CREATININE 0.81 0.60* 0.84  GLUCOSE 113* 111* 127*    Electrolytes  Recent Labs Lab 06/25/16 0923 06/26/16 0500  CALCIUM 8.3* 7.4*    CBC  Recent Labs Lab 06/25/16 0923 06/25/16 1241 06/26/16 0500  WBC 4.9  --  9.7  HGB 14.1 13.6 10.6*  HCT 42.2 40.0 31.3*  PLT 222  --  184    Coag's  Recent Labs Lab 06/25/16 0923  APTT 27  INR 1.25    Sepsis Markers No results for input(s): LATICACIDVEN, PROCALCITON, O2SATVEN in the last 168 hours.  ABG  Recent Labs Lab 06/25/16 1940  PHART 7.355  PCO2ART 30.6*  PO2ART 359*    Liver Enzymes  Recent Labs Lab 06/25/16 0923  AST 75*  ALT 39  ALKPHOS 78  BILITOT 1.0  ALBUMIN 2.5*    Cardiac Enzymes No results for input(s): TROPONINI, PROBNP in the last 168 hours.  Glucose  Recent Labs Lab 06/25/16 1010  GLUCAP 108*    Imaging Ir US Guide Vasc Access Left  Result Date: 06/25/2016 INDICATION: 75 year old male with a history of stroke EXAM: Left PICC LINE PLACEMENT  WITH ULTRASOUND AND FLUOROSCOPIC GUIDANCE MEDICATIONS: None ANESTHESIA/SEDATION: None FLUOROSCOPY TIME:  None COMPLICATIONS: None immediate. PROCEDURE: The patient was advised of the possible risks and complications and agreed to undergo the procedure. The patient was then brought to the angiographic suite for the procedure. The left arm was prepped with chlorhexidine, draped in the usual sterile fashion using maximum barrier technique (cap and mask, sterile gown, sterile gloves, large sterile sheet, hand hygiene and cutaneous antisepsis) and infiltrated locally with 1% Lidocaine. Ultrasound demonstrated patency of the left basilic vein, and this was documented with an image. Under real-time ultrasound  guidance, this vein was accessed with a 21 gauge micropuncture needle and image documentation was performed. A 0.018 wire was introduced in to the vein. Over this, a 4 [place] single lumen micro puncture was placed. IMPRESSION: Status post ultrasound-guided venipuncture of the left basilic vein. Signed, Dulcy Fanny. Earleen Newport, DO [facility] Radiology Electronically Signed   By: Corrie Mckusick D.O.   On: 06/25/2016 14:21   Ct Cerebral Perfusion W Contrast  Result Date: 06/25/2016 CLINICAL DATA:  Stroke. EXAM: CT CEREBRAL PERFUSION WITH CONTRAST TECHNIQUE: CT perfusion was performed with RAPID software CONTRAST:  40 mL Isovue 370 IV COMPARISON:  CT head 06/25/2016 FINDINGS: Occlusion right M1. Acute infarct right MCA territory with low-density in the right temporal parietal lobe. Left middle cerebral artery patent. Core infarct volume based on cerebral blood flow 30% of normal is 55 mL Penumbra volume based on T max greater than 6 seconds  152 mL IMPRESSION: Right MCA infarct.  Core infarct 55 mL.  Penumbra 152 mL These results were called by telephone at the time of interpretation on 06/25/2016 at 1:45 pm to Dr. Norman Clay , who verbally acknowledged these results. Electronically Signed   By: Franchot Gallo M.D.   On: 06/25/2016 13:53   Dg Chest Port 1 View  Result Date: 06/25/2016 CLINICAL DATA:  Endotracheal tube placement.  Initial encounter. EXAM: PORTABLE CHEST 1 VIEW COMPARISON:  Chest radiograph and CTA of the chest performed 06/09/2016 FINDINGS: The patient's endotracheal tube is seen ending 4-5 cm above the carina. An enteric tube is noted extending below the diaphragm. Mild right midlung and left basilar atelectasis are noted. No pleural effusion or pneumothorax is seen. The cardiomediastinal silhouette is normal in size. No acute osseous abnormalities are identified. There is chronic deformity of the left eighth posterior lateral rib. IMPRESSION: 1.  Endotracheal tube seen ending 4-5 cm above the carina. 2. Mild right midlung and left basilar atelectasis noted. Electronically Signed   By: Garald Balding M.D.   On: 06/25/2016 21:00   Dg Abd Portable 1v  Result Date: 06/25/2016 CLINICAL DATA:  Orogastric tube placement.  Initial encounter. EXAM: PORTABLE ABDOMEN - 1 VIEW COMPARISON:  CT of the abdomen and pelvis from 06/11/2016 FINDINGS: The patient's enteric tube is noted ending overlying the body of the stomach. Contrast is noted within the renal calyces. The visualized bowel gas pattern is grossly unremarkable. Evaluation for free air is limited given on a single supine view, but no free intra-abdominal air is seen. No acute osseous abnormalities are identified. IMPRESSION: Enteric tube noted ending overlying the body of the stomach. Electronically Signed   By: Garald Balding M.D.   On: 06/25/2016 20:58   Ir Radiology Peripheral Guided Iv Start  Result Date: 06/25/2016 INDICATION: 75 year old male with a history of stroke EXAM: Left PICC LINE PLACEMENT WITH ULTRASOUND AND FLUOROSCOPIC GUIDANCE MEDICATIONS: None ANESTHESIA/SEDATION: None FLUOROSCOPY TIME:  None COMPLICATIONS: None immediate. PROCEDURE: The patient was advised of the possible risks and complications and agreed to undergo the procedure. The patient was then brought to the angiographic suite for the procedure. The left arm was prepped with chlorhexidine, draped in the usual sterile fashion using maximum barrier technique (cap and mask, sterile gown, sterile gloves, large sterile sheet, hand hygiene and cutaneous antisepsis) and infiltrated locally with 1% Lidocaine. Ultrasound demonstrated patency of the left basilic vein, and this was documented with an image. Under real-time ultrasound guidance, this vein was accessed with a 21 gauge micropuncture needle and image documentation was performed. A 0.018 wire was introduced in to the vein. Over this, a 4 [place] single lumen micro puncture was  placed. IMPRESSION: Status post ultrasound-guided venipuncture of the left basilic vein. Signed, Dulcy Fanny. Earleen Newport, DO [facility] Radiology Electronically Signed   By: Corrie Mckusick D.O.   On: 06/25/2016 14:21   Ct Head Code Stroke Wo Contrast`  Result Date: 06/25/2016 CLINICAL DATA:  Code stroke. Right MCA infarct. Status post revascularization of right MCA occlusion. EXAM: CT HEAD WITHOUT CONTRAST TECHNIQUE: Contiguous axial images were obtained from the base of the skull through the vertex without intravenous contrast. COMPARISON:  CT head and cerebral perfusion earlier today FINDINGS: Brain: Compared to the CT from earlier today, there is mildly progressive loss of gray-white differentiation and sulcal effacement involving the right temporal lobe, right parietal lobe, and right insula consistent with known acute infarct. No definite acute intracranial hemorrhage is identified. With a small amount of curvilinear density in the posterior aspect of the right sylvian fissure favored to reflect residual vascular contrast material. No midline shift. Old cortical infarcts are again seen in the left parietal and right frontal lobes. Moderate chronic small vessel ischemic disease is noted in the cerebral white matter bilaterally. There is mild global cerebral atrophy. Small volume hyperattenuating extra-axial material overlying the left frontal lobe measures up to 4 mm in thickness and is similar in appearance to a 2014 study. Vascular: Residual intravascular contrast material from recent endovascular intervention. Calcified atherosclerosis at the skullbase. Skull: No fracture or focal osseous lesion. Sinuses/Orbits: Mild mucosal thickening in the ethmoid sinuses. Clear mastoid air cells. Unremarkable orbits. Other: None. IMPRESSION: 1. Evolving moderate-sized acute right MCA infarct. No evidence of acute intracranial hemorrhage. 2. Chronic ischemic changes as above. 3.  Small volume extra-axial material over the anterior left frontal lobe, present since 2014. This may reflect dural thickening nor a chronic, complex subdural collection. No mass effect. Electronically Signed   By: Logan Bores M.D.   On: 06/25/2016 17:50     STUDIES:  CT brain/perfusion 9/21: Right MCA infarct.    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 Sedation on Ventilator  P:   RASS goal: 0 to -1 Propofol gtt Care per Neurology/Neuro IR Thiamine VT daily MRI Brain pending  PULMONARY A: Acute Respiratory Failure - Intubated for procedure. Not protecting airway.  P:   Full Vent Support SBT as mental status allows & once able to sit upright.  CARDIOVASCULAR A:  H/o Torsades - QTc 5110ms 9/21. H/o CAD H/o systolic CM (EF [id number]) H/o HTN   P:  Continuous Telemetry Monitoring  Vitals per  uni tprotocol Goal SBP 120-140 per Neuro IR Resume ASA when ok w/ neuro  Will hold his home lasix, lopressor and spironolactone for now Discontinue Arterial Line  RENAL A:   No acute issue.  P:   Monitoring UOP Trending electrolytes & renal function daily Replacing electrolytes as indicated Stat Magnesium Level  GASTROINTESTINAL A:   H/o Hep C and Cirrhosis  H/o GIB w/ nsaids GERD Questionable H/O pancreatitis (on Creon at home)  P:   Carafate VT q6hr NPO Holding on Tube Feedings  HEMATOLOGIC A:   H/o thrombocytopenia (currently WNL) Anemia - Mild. No signs of active bleeding. Likely due to blood loss with aspiration during thrombectomy.  P:  SCDs Anticoagulation per neurology  Trending cell counts w/ CBC Careful monitoring if antiplatelet therapy initiated given h/o GIB  Repeat Hgb/Hct @ 1700  INFECTIOUS A:   No acute  Issues  P:   Monitoring for signs of infection.  ENDOCRINE A:   Hypothyroidism - TSH 14.387 & Free T4 0.59. Hyperglycemia - Mild. No h/o DM.  P:   Synthroid VT daily Monitor glucose on daily chemistry  FAMILY  - Updates:  No family at bedside 9/22.  - Inter-disciplinary family meet or Palliative Care meeting due by:  9/28  TODAY'S SUMMARY:  75 y.o. male w/ multiple medical problems. Admitted on 9/12 for large right MCA stroke. Went to IR and underwent mechanical thrombectomy. Returned to the intensive care post-op. PCCM asked to assist with care. Goal SBP 120-140. Patient's sheath has been removed. MRI pending for 4 PM today. Continuing ventilator wean. Probable extubation in the morning. Checking serum magnesium given prolonged QTc and history of Torsades.  I have spent a total of 31 minutes of critical care time today caring for the patient and reviewing the patient's electronic medical record.  Sonia Baller Ashok Cordia, M.D. [facility] Pulmonary & Critical Care Pager:  [phone] After 3pm or if no response, call [phone] 06/26/2016, 12:07 PM

## 2016-06-26 NOTE — Progress Notes (Signed)
OT Cancellation Note  Patient Details Name: ISIAIH SCHWEIKART MRN: [id number] DOB: 06/09/1941   Cancelled Treatment:    Reason Eval/Treat Not Completed: Medical issues which prohibited therapy (bedrest s/p sheath removal)  Binnie Kand M.S., OTR/L Pager: [phone]  06/26/2016, 9:33 AM

## 2016-06-26 NOTE — Progress Notes (Signed)
06/25/16-Departed IR at 1700 to CT then to 3MW. Anesthesia monitoring pt vitals signs at this time.

## 2016-06-26 NOTE — Progress Notes (Signed)
PT Cancellation Note  Patient Details Name: Nathan Frank MRN: [id number] DOB: March 26, 1941   Cancelled Treatment:    Reason Eval/Treat Not Completed: Patient not medically ready, bedrest s/p sheath removal will follow.   Duncan Dull 06/26/2016, 9:33 AM

## 2016-06-26 NOTE — Progress Notes (Addendum)
STROKE TEAM PROGRESS NOTE   HISTORY OF PRESENT ILLNESS (per record) Nathan Frank is an 75 y.o. male with hx of CAD, CHF (EF 40-45%), GIB, Cirrhosis, Hep C and hepatocellular carcinoma, thrombocytopenia and ETOH abuse presents after falling in the middle of the night and having L sided weakness and L facial droop along with dysarthria. He was last known well 06/24/16 at 11pm. Patient was not administered IV t-PA secondary to delay in arrival. CT perfusion showed salvageable defect. He was taken to IR where he received TICI 3 revascularization of occluded R MCA using solitaire, penumbra and IA integrilin He was admitted to the neuro ICU for further evaluation and treatment.   SUBJECTIVE (INTERVAL HISTORY) No family at bedside. Groin sheath removed. Intubated. Alert and following commands.  OBJECTIVE Temp:  [97.4 F (36.3 C)-100.2 F (37.9 C)] 100.2 F (37.9 C) (09/22 0800) Pulse Rate:  [82-110] 82 (09/22 0900) Cardiac Rhythm: Normal sinus rhythm (09/21 2000) Resp:  [14-25] 14 (09/22 0900) BP: (95-147)/(62-96) 106/70 (09/22 0900) SpO2:  [98 %-100 %] 100 % (09/22 0900) Arterial Line BP: (103-155)/(46-82) 117/55 (09/22 0900) FiO2 (%):  [40 %-100 %] 40 % (09/22 0826)  CBC:  Recent Labs Lab 06/25/16 0923 06/25/16 1241 06/26/16 0500  WBC 4.9  --  9.7  NEUTROABS 2.9  --  7.8*  HGB 14.1 13.6 10.6*  HCT 42.2 40.0 31.3*  MCV 98.8  --  98.4  PLT 222  --  [id number]    Basic Metabolic Panel:  Recent Labs Lab 06/25/16 0923 06/25/16 1241 06/26/16 0500  NA 141 144 139  K 3.9 4.1 4.0  CL 112* 110 114*  CO2 20*  --  18*  GLUCOSE 113* 111* 127*  BUN <5* <3* 5*  CREATININE 0.81 0.60* 0.84  CALCIUM 8.3*  --  7.4*    Lipid Panel:    Component Value Date/Time   CHOL 77 04/19/2016 0212   TRIG 136 06/25/2016 2245   HDL 11 (L) 04/19/2016 0212   CHOLHDL 7.0 04/19/2016 0212   VLDL 30 04/19/2016 0212   LDLCALC 36 04/19/2016 0212   HgbA1c:  Lab Results  Component Value Date   HGBA1C 4.6  (L) 04/19/2016   Urine Drug Screen:    Component Value Date/Time   LABOPIA POSITIVE (A) 06/09/2016 2059   COCAINSCRNUR NONE DETECTED 06/09/2016 2059   COCAINSCRNUR NEGATIVE 12/13/2014 2353   LABBENZ POSITIVE (A) 06/09/2016 2059   LABBENZ POSITIVE (A) 12/13/2014 2353   AMPHETMU NONE DETECTED 06/09/2016 2059   THCU NONE DETECTED 06/09/2016 2059   LABBARB NONE DETECTED 06/09/2016 2059      IMAGING  Ct Head Code Stroke Wo Contrast` 06/25/2016 1. Evolving moderate-sized acute right MCA infarct. No evidence of acute intracranial hemorrhage. 2. Chronic ischemic changes as above. 3. Small volume extra-axial material over the anterior left frontal lobe, present since 2014. This may reflect dural thickening nor a chronic, complex subdural collection. No mass effect.   Ir US Guide Vasc Access Left / Ir Radiology Peripheral Guided Iv Start 06/25/2016 Status post ultrasound-guided venipuncture of the left basilic vein.   Ct Cerebral Perfusion W Contrast 06/25/2016 Right MCA infarct.  Core infarct 55 mL.  Penumbra 152 mL   Cerebral Angio 06/25/2016 S/P Rt common carotid arteriogram,followed by complete revascularization of occluded RT MCA prox with x 3 passes with solitaire 4 mm x 40 mm FR retrieval device ,and ACE penumbra aspiration device, and 7.2 mg of IA integrelin superselectively achieving a TICI 3 reperfusion  Ct Head  Wo Contrast post IR 06/25/2016 Dense right MCA with generalized decreased attenuation involving the right parietal and temporal lobes consistent with acute ischemia. Areas of subacute to chronic ischemia as described. No acute hemorrhage is noted.   Dg Chest Port 1 View 06/25/2016 1. Endotracheal tube seen ending 4-5 cm above the carina. 2. Mild right midlung and left basilar atelectasis noted.    PHYSICAL EXAM Physical exam: Exam: Gen: NAD, alert, intubated not sedated, follows simple commands Eyes: anicteric sclerae, moist conjunctivae                    CV: no MRG,  no carotid bruits, no peripheral edema  Neuro: Detailed Neurologic Exam  Speech:   intubated  Cranial Nerves:    The pupils are equal, round, and reactive to light.. Attempted, Fundi not visualized.  Gaze conjugate, right gave preference. Left facial weakness, Blinks to threat on the right. Can wiggle toes on the right and lift right arm antigravity to command,   Hearing intact to voice.   Motor Observation:    no involuntary movements noted. Tone appears normal.     Strength:    Left hemiparesis can move arm and leg minimally on command     Sensation:  Intact to LT  Plantars left upgoing.    ASSESSMENT/PLAN [name] is a 75 y.o. male with history of CAD, CHF (EF 40-45%), GIB, Cirrhosis, Hep C and hepatocellular carcinoma, thrombocytopenia and ETOH abuse presenting with L hemiparesis, L facial and dysarthria. He did not receive IV t-PA due to delay in arrival. He was taken to IR where he received TICI 3 revascularization of occluded R MCA using solitaire, penumbra and IA integrilin.   Stroke:  Non-dominant large right MCA infarct s/p TICI 3 revascularization of occluded R MCA with mechanical thrombectomy and IA Integrilin, infarct embolic secondary to unknown source  Cerebral angio occluded R MCA  MRI  pending   2D Echo  pending   Consider TEE pending MRI, need to schedule if needed   LDL 36 in July  HgbA1c 4.6 in July  SCDs for VTE prophylaxis  Diet NPO time specified  aspirin 325 mg daily prior to admission, now on No antithrombotic. 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 Pancreatitis , on Creon  Hx thrombocytopenia, WNL now  Hypothyroidism on synthroid  Hospital day # 1  Patient is a 75 year old male who presented with L hemiparesis, L facial and dysarthria, right MCA infarct s/p TICI 3 revascularization of occluded R MCA with mechanical thrombectomy and IA Integrilin. Appears embolic in  etiology. Still pending MRI of the brain. No family at bedside. Patient was alert and following commands this morning. Extubate as tolerated with supportive care.    Personally examined patient and images, and have participated in and made any corrections needed to history, physical, neuro exam,assessment and plan as stated above.  I have personally obtained the history, evaluated lab date, reviewed imaging studies and agree with radiology interpretations.    Sarina Ill, MD Stroke Neurology [phone] [facility]  A total of 35 minutes was spent in the care of this patient.      To contact Stroke Continuity provider, please refer to [email or web address] After hours, contact General Neurology

## 2016-06-26 NOTE — Progress Notes (Signed)
PT transported to MRI and back to room [id number] without complication.

## 2016-06-26 NOTE — Progress Notes (Signed)
[facility] Progress Note Patient Name: Nathan Frank DOB: 05-Feb-1941 MRN: [id number]   Date of Service  06/26/2016  HPI/Events of Note  Mg++ = 1.3 and Creatinine = 0.84.  eICU Interventions  Will replace Mg++.     Intervention Category Intermediate Interventions: Electrolyte abnormality - evaluation and management  Nathan Frank Eugene 06/26/2016, 7:11 PM

## 2016-06-26 NOTE — Progress Notes (Signed)
SLP Cancellation Note  Patient Details Name: Nathan Frank MRN: [id number] DOB: [birth year]   Cancelled treatment:         Pt intubated. Will follow progress.   Houston Siren 06/26/2016, 1:38 PM   [name] Colvin Caroli.Ed [employer] [phone]

## 2016-06-26 NOTE — Progress Notes (Signed)
Initial Nutrition Assessment  DOCUMENTATION CODES:   Not applicable  INTERVENTION:    If unable to extubate, recommend initiate TF via OGT with Vital AF 1.2 at goal rate of 60 ml/h (1440 ml per day) to provide 1728 kcals, 108 gm protein, 1168 ml free water daily.  Total intake with TF and propofol would be 1944 kcal.  NUTRITION DIAGNOSIS:   Inadequate oral intake related to inability to eat as evidenced by NPO status.  GOAL:   Patient will meet greater than or equal to 90% of their needs  MONITOR:   Vent status, Labs, Weight trends, I & O's  REASON FOR ASSESSMENT:   Ventilator    ASSESSMENT:   75 year old male admitted on 9/21 with right MCA infarct. S/P thrombectomy of the R MCA clot. He returned to the ICU on full vent support.    Patient is currently intubated on ventilator support; OGT in place. MV: 8.2 L/min Temp (24hrs), Avg:98.8 F (37.1 C), Min:97.4 F (36.3 C), Max:100.2 F (37.9 C)  Propofol: 8.2 ml/hr providing 216 kcal Labs reviewed. Medications reviewed and include thiamine. No plans to start enteral feedings at this time.  Diet Order:  Diet NPO time specified  Skin:  Reviewed, no issues  Last BM:  9/21  Height:   Ht Readings from Last 1 Encounters:  06/25/16 5\' 10"  (1.778 m)    Weight:   Wt Readings from Last 1 Encounters:  06/25/16 201 lb 11.5 oz (91.5 kg)    Ideal Body Weight:  75.5 kg  BMI:  Body mass index is 28.94 kg/m.  Estimated Nutritional Needs:   Kcal:  1966  Protein:  110-130 gm  Fluid:  2 L  EDUCATION NEEDS:   No education needs identified at this time  Molli Barrows, [place], [place], [place] Pager [phone] After Hours Pager [phone]

## 2016-06-26 NOTE — Progress Notes (Signed)
9 fr Rt femoral sheath removed at 0925am with Exoseal attempt and manual pressure.  Sheath was quite kinked upon removal.  Manual pressure applied for 30 minutes.  Hemostasis obtained at 955am.  Site has skin compromise with some tearing at groin crease.  Reviewed with [place] RN.  No hematoma noted.  Distal pulses intact.  Pressure dressing applied.

## 2016-06-26 NOTE — Progress Notes (Signed)
Referring Physician(s): Dr Myrla Halsted  Supervising Physician: Luanne Bras  Patient Status:  Inpatient  Chief Complaint:  CVA 9/21 CEREBRAL ANGIOGRAM [id number] (Custom)]       S/P Rt common carotid arteriogram,followed by complete revascularization of occluded RT MCA prox with x 3 passes with solitaire 4 mm x 40 mm FR retrieval device ,and ACE penumbra aspiration device, and 7.2 mg of IA integrelin superselectively achieving a TICI 3 reperfusion       Subjective:  Moving all 4s Lt weaker On vent  Allergies: Review of patient's allergies indicates no known allergies.  Medications: Prior to Admission medications   Medication Sig Start Date End Date Taking? Authorizing Provider  aspirin EC 325 MG tablet Take 1 tablet (325 mg total) by mouth daily. 06/16/16  Yes Florencia Reasons, MD  Cholecalciferol (VITAMIN D PO) Take 1 tablet by mouth daily.   Yes Historical Provider, MD  folic acid (FOLVITE) 1 MG tablet Take 1 tablet (1 mg total) by mouth daily. 04/21/16  Yes Maryann Mikhail, DO  furosemide (LASIX) 40 MG tablet Take 1 tablet (40 mg total) by mouth daily. 06/16/16  Yes Florencia Reasons, MD  levothyroxine (SYNTHROID, LEVOTHROID) 50 MCG tablet Take 1 tablet (50 mcg total) by mouth daily before breakfast. 06/16/16  Yes Florencia Reasons, MD  magnesium oxide (MAG-OX) 400 (241.3 Mg) MG tablet Take 1 tablet (400 mg total) by mouth daily. 06/03/16  Yes Verlee Monte, MD  metoprolol tartrate (LOPRESSOR) 25 MG tablet Take 1 tablet (25 mg total) by mouth 3 (three) times daily. 06/16/16  Yes Florencia Reasons, MD  Multiple Vitamin (MULTIVITAMIN WITH MINERALS) TABS tablet Take 1 tablet by mouth daily. 04/21/16  Yes Maryann Mikhail, DO  potassium chloride (K-DUR) 10 MEQ tablet Take 2 tablets (20 mEq total) by mouth daily. 06/16/16  Yes Florencia Reasons, MD  spironolactone (ALDACTONE) 25 MG tablet Take 0.5 tablets (12.5 mg total) by mouth daily. 06/16/16  Yes Florencia Reasons, MD  sucralfate (CARAFATE) 1 g tablet Take 1 tablet (1 g total) by mouth  2 (two) times daily. 06/16/16  Yes Florencia Reasons, MD  zolpidem (AMBIEN) 5 MG tablet Take 1 tablet (5 mg total) by mouth at bedtime as needed for sleep. 06/15/16  Yes Florencia Reasons, MD  lipase/protease/amylase (CREON) 12000 units CPEP capsule Take 1 capsule (12,000 Units total) by mouth 3 (three) times daily with meals. Patient not taking: Reported on 06/25/2016 06/16/16   Florencia Reasons, MD  saccharomyces boulardii (FLORASTOR) 250 MG capsule Take 1 capsule (250 mg total) by mouth 2 (two) times daily. Patient not taking: Reported on 06/25/2016 06/03/16   Verlee Monte, MD  thiamine 100 MG tablet Take 1 tablet (100 mg total) by mouth daily. Patient not taking: Reported on 05/27/2016 04/21/16   Cristal Ford, DO     Vital Signs: BP 123/83   Pulse 88   Temp 100.2 F (37.9 C) (Oral)   Resp 16   Ht 5\' 10"  (1.778 m)   Wt 201 lb 11.5 oz (91.5 kg)   SpO2 100%   BMI 28.94 kg/m   Physical Exam  Eyes: EOM are normal.  Musculoskeletal: Normal range of motion.  Moving all 4s to command Left weaker Follows me Opens and closes eyes  Neurological: He is alert. He displays abnormal reflex.  Skin: Skin is warm and dry.  Rt groin NT no bleeding No hematoma Rt foot 2+ pulses  Nursing note and vitals reviewed.   Imaging: Ct Head Wo Contrast  Result Date: 06/25/2016  CLINICAL DATA:  Left-sided weakness and facial droop EXAM: CT HEAD WITHOUT CONTRAST TECHNIQUE: Contiguous axial images were obtained from the base of the skull through the vertex without intravenous contrast. COMPARISON:  02/13/2014 FINDINGS: Brain: Atrophic changes are noted. There are some geographic areas of decreased attenuation identified consistent with prior subacute to chronic ischemia. One is noted within the right frontal lobe somewhat progressed from the prior exam and 1 within the posterior aspect of the left parietal lobe. There is generalized decreased attenuation involving the right temporal lobe and extending into the right parietal lobe the  distribution of the right middle cerebral artery. Vascular: Slight increased density is noted within the right MCA distally. Skull: No acute bony abnormality noted. Sinuses/Orbits: No acute finding. Other: None. IMPRESSION: Dense right MCA with generalized decreased attenuation involving the right parietal and temporal lobes consistent with acute ischemia. Areas of subacute to chronic ischemia as described. No acute hemorrhage is noted. Critical Value/emergent results were called by telephone at the time of interpretation on 06/25/2016 at 11:00 am to Dr. Carmin Muskrat , who verbally acknowledged these results. Electronically Signed   By: Inez Catalina M.D.   On: 06/25/2016 11:01   Ir US Guide Vasc Access Left  Result Date: 06/25/2016 INDICATION: 75 year old male with a history of stroke EXAM: Left PICC LINE PLACEMENT WITH ULTRASOUND AND FLUOROSCOPIC GUIDANCE MEDICATIONS: None ANESTHESIA/SEDATION: None FLUOROSCOPY TIME:  None COMPLICATIONS: None immediate. PROCEDURE: The patient was advised of the possible risks and complications and agreed to undergo the procedure. The patient was then brought to the angiographic suite for the procedure. The left arm was prepped with chlorhexidine, draped in the usual sterile fashion using maximum barrier technique (cap and mask, sterile gown, sterile gloves, large sterile sheet, hand hygiene and cutaneous antisepsis) and infiltrated locally with 1% Lidocaine. Ultrasound demonstrated patency of the left basilic vein, and this was documented with an image. Under real-time ultrasound guidance, this vein was accessed with a 21 gauge micropuncture needle and image documentation was performed. A 0.018 wire was introduced in to the vein. Over this, a 4 [place] single lumen micro puncture was placed. IMPRESSION: Status post ultrasound-guided venipuncture of the left basilic vein. Signed, Dulcy Fanny. Earleen Newport, DO [facility]  Electronically Signed   By: Corrie Mckusick D.O.   On: 06/25/2016 14:21   Ct Cerebral Perfusion W Contrast  Result Date: 06/25/2016 CLINICAL DATA:  Stroke. EXAM: CT CEREBRAL PERFUSION WITH CONTRAST TECHNIQUE: CT perfusion was performed with RAPID software CONTRAST:  40 mL Isovue 370 IV COMPARISON:  CT head 06/25/2016 FINDINGS: Occlusion right M1. Acute infarct right MCA territory with low-density in the right temporal parietal lobe. Left middle cerebral artery patent. Core infarct volume based on cerebral blood flow 30% of normal is 55 mL Penumbra volume based on T max greater than 6 seconds  152 mL IMPRESSION: Right MCA infarct.  Core infarct 55 mL.  Penumbra 152 mL These results were called by telephone at the time of interpretation on 06/25/2016 at 1:45 pm to Dr. Norman Clay , who verbally acknowledged these results. Electronically Signed   By: Franchot Gallo M.D.   On: 06/25/2016 13:53   Dg Chest Port 1 View  Result Date: 06/25/2016 CLINICAL DATA:  Endotracheal tube placement.  Initial encounter. EXAM: PORTABLE CHEST 1 VIEW COMPARISON:  Chest radiograph and CTA of the chest performed 06/09/2016 FINDINGS: The patient's endotracheal tube is seen ending 4-5 cm above the carina. An enteric tube is noted extending  below the diaphragm. Mild right midlung and left basilar atelectasis are noted. No pleural effusion or pneumothorax is seen. The cardiomediastinal silhouette is normal in size. No acute osseous abnormalities are identified. There is chronic deformity of the left eighth posterior lateral rib. IMPRESSION: 1. Endotracheal tube seen ending 4-5 cm above the carina. 2. Mild right midlung and left basilar atelectasis noted. Electronically Signed   By: Garald Balding M.D.   On: 06/25/2016 21:00   Dg Abd Portable 1v  Result Date: 06/25/2016 CLINICAL DATA:  Orogastric tube placement.  Initial encounter. EXAM: PORTABLE ABDOMEN - 1 VIEW COMPARISON:  CT of the abdomen and pelvis from 06/11/2016 FINDINGS:  The patient's enteric tube is noted ending overlying the body of the stomach. Contrast is noted within the renal calyces. The visualized bowel gas pattern is grossly unremarkable. Evaluation for free air is limited given on a single supine view, but no free intra-abdominal air is seen. No acute osseous abnormalities are identified. IMPRESSION: Enteric tube noted ending overlying the body of the stomach. Electronically Signed   By: Garald Balding M.D.   On: 06/25/2016 20:58   Ir Radiology Peripheral Guided Iv Start  Result Date: 06/25/2016 INDICATION: 75 year old male with a history of stroke EXAM: Left PICC LINE PLACEMENT WITH ULTRASOUND AND FLUOROSCOPIC GUIDANCE MEDICATIONS: None ANESTHESIA/SEDATION: None FLUOROSCOPY TIME:  None COMPLICATIONS: None immediate. PROCEDURE: The patient was advised of the possible risks and complications and agreed to undergo the procedure. The patient was then brought to the angiographic suite for the procedure. The left arm was prepped with chlorhexidine, draped in the usual sterile fashion using maximum barrier technique (cap and mask, sterile gown, sterile gloves, large sterile sheet, hand hygiene and cutaneous antisepsis) and infiltrated locally with 1% Lidocaine. Ultrasound demonstrated patency of the left basilic vein, and this was documented with an image. Under real-time ultrasound guidance, this vein was accessed with a 21 gauge micropuncture needle and image documentation was performed. A 0.018 wire was introduced in to the vein. Over this, a 4 [place] single lumen micro puncture was placed. IMPRESSION: Status post ultrasound-guided venipuncture of the left basilic vein. Signed, Dulcy Fanny. Earleen Newport, DO [facility] Electronically Signed   By: Corrie Mckusick D.O.   On: 06/25/2016 14:21   Ct Head Code Stroke Wo Contrast`  Result Date: 06/25/2016 CLINICAL DATA:  Code stroke. Right MCA infarct. Status post  revascularization of right MCA occlusion. EXAM: CT HEAD WITHOUT CONTRAST TECHNIQUE: Contiguous axial images were obtained from the base of the skull through the vertex without intravenous contrast. COMPARISON:  CT head and cerebral perfusion earlier today FINDINGS: Brain: Compared to the CT from earlier today, there is mildly progressive loss of gray-white differentiation and sulcal effacement involving the right temporal lobe, right parietal lobe, and right insula consistent with known acute infarct. No definite acute intracranial hemorrhage is identified. With a small amount of curvilinear density in the posterior aspect of the right sylvian fissure favored to reflect residual vascular contrast material. No midline shift. Old cortical infarcts are again seen in the left parietal and right frontal lobes. Moderate chronic small vessel ischemic disease is noted in the cerebral white matter bilaterally. There is mild global cerebral atrophy. Small volume hyperattenuating extra-axial material overlying the left frontal lobe measures up to 4 mm in thickness and is similar in appearance to a 2014 study. Vascular: Residual intravascular contrast material from recent endovascular intervention. Calcified atherosclerosis at the skullbase. Skull: No fracture or focal osseous lesion.  Sinuses/Orbits: Mild mucosal thickening in the ethmoid sinuses. Clear mastoid air cells. Unremarkable orbits. Other: None. IMPRESSION: 1. Evolving moderate-sized acute right MCA infarct. No evidence of acute intracranial hemorrhage. 2. Chronic ischemic changes as above. 3. Small volume extra-axial material over the anterior left frontal lobe, present since 2014. This may reflect dural thickening nor a chronic, complex subdural collection. No mass effect. Electronically Signed   By: Logan Bores M.D.   On: 06/25/2016 17:50    Labs:  CBC:  Recent Labs  06/11/16 0235 06/14/16 0527 06/25/16 0923 06/25/16 1241 06/26/16 0500  WBC 3.6* 4.2  4.9  --  9.7  HGB 11.8* 12.5* 14.1 13.6 10.6*  HCT 35.9* 37.3* 42.2 40.0 31.3*  PLT 132* 139* 222  --  184    COAGS:  Recent Labs  12/02/15 1250  04/19/16 0212  04/28/16 2040  06/01/16 0338 06/09/16 2304 06/10/16 0758 06/25/16 0923  INR 1.34  < > 1.89*  < >  --   < > 1.64 1.34 1.46 1.25  APTT 26  --  33  --  31  --   --   --   --  27  < > = values in this interval not displayed.  BMP:  Recent Labs  06/15/16 0329 06/16/16 1129 06/25/16 0923 06/25/16 1241 06/26/16 0500  NA 137 140 141 144 139  K 3.9 4.2 3.9 4.1 4.0  CL 105 108 112* 110 114*  CO2 28 25 20*  --  18*  GLUCOSE 103* 95 113* 111* 127*  BUN 7 9 <5* <3* 5*  CALCIUM 8.3* 8.5* 8.3*  --  7.4*  CREATININE 0.88 0.85 0.81 0.60* 0.84  GFRNONAA >60 >60 >60  --  >60  GFRAA >60 >60 >60  --  >60    LIVER FUNCTION TESTS:  Recent Labs  06/12/16 0659 06/13/16 0449 06/14/16 0527 06/25/16 0923  BILITOT 1.3* 1.3* 1.0 1.0  AST 59* 57* 65* 75*  ALT 19 19 19  39  ALKPHOS 70 64 82 78  PROT 5.6* 5.5* 5.9* 7.1  ALBUMIN 1.9* 1.9* 1.8* 2.5*    Assessment and Plan:  CVA R MCA revasc 9/21 will follow  Electronically Signed: Standley Bargo A 06/26/2016, 12:19 PM   I spent a total of 15 Minutes at the the patient's bedside AND on the patient's hospital floor or unit, greater than 50% of which was counseling/coordinating care for R MCA revasc

## 2016-06-27 ENCOUNTER — Inpatient Hospital Stay (HOSPITAL_COMMUNITY): Payer: Medicare Other

## 2016-06-27 LAB — CBC WITH DIFFERENTIAL/PLATELET
BASOS ABS: 0 10*3/uL (ref 0.0–0.1)
BASOS PCT: 0 %
EOS ABS: 0.2 10*3/uL (ref 0.0–0.7)
Eosinophils Relative: 2 %
HCT: 31.1 % — ABNORMAL LOW (ref 39.0–52.0)
Hemoglobin: 10.4 g/dL — ABNORMAL LOW (ref 13.0–17.0)
LYMPHS PCT: 10 %
Lymphs Abs: 1.1 10*3/uL (ref 0.7–4.0)
MCH: 33.2 pg (ref 26.0–34.0)
MCHC: 33.4 g/dL (ref 30.0–36.0)
MCV: 99.4 fL (ref 78.0–100.0)
MONO ABS: 1.5 10*3/uL — AB (ref 0.1–1.0)
Monocytes Relative: 13 %
NEUTROS PCT: 75 %
Neutro Abs: 8.6 10*3/uL — ABNORMAL HIGH (ref 1.7–7.7)
PLATELETS: 115 10*3/uL — AB (ref 150–400)
RBC: 3.13 MIL/uL — AB (ref 4.22–5.81)
RDW: 16 % — ABNORMAL HIGH (ref 11.5–15.5)
WBC: 11.4 10*3/uL — AB (ref 4.0–10.5)

## 2016-06-27 LAB — HEMOGLOBIN A1C
HEMOGLOBIN A1C: 4.5 % — AB (ref 4.8–5.6)
Mean Plasma Glucose: 82 mg/dL

## 2016-06-27 LAB — RENAL FUNCTION PANEL
ALBUMIN: 1.9 g/dL — AB (ref 3.5–5.0)
ANION GAP: 10 (ref 5–15)
BUN: 7 mg/dL (ref 6–20)
CALCIUM: 7.5 mg/dL — AB (ref 8.9–10.3)
CO2: 17 mmol/L — ABNORMAL LOW (ref 22–32)
Chloride: 115 mmol/L — ABNORMAL HIGH (ref 101–111)
Creatinine, Ser: 0.79 mg/dL (ref 0.61–1.24)
Glucose, Bld: 123 mg/dL — ABNORMAL HIGH (ref 65–99)
PHOSPHORUS: 2.1 mg/dL — AB (ref 2.5–4.6)
POTASSIUM: 3.8 mmol/L (ref 3.5–5.1)
SODIUM: 142 mmol/L (ref 135–145)

## 2016-06-27 LAB — EXPECTORATED SPUTUM ASSESSMENT W REFEX TO RESP CULTURE

## 2016-06-27 LAB — EXPECTORATED SPUTUM ASSESSMENT W GRAM STAIN, RFLX TO RESP C

## 2016-06-27 LAB — TRIGLYCERIDES: Triglycerides: 91 mg/dL (ref <150)

## 2016-06-27 LAB — MAGNESIUM: MAGNESIUM: 1.7 mg/dL (ref 1.7–2.4)

## 2016-06-27 MED ORDER — SODIUM CHLORIDE 0.9 % IV BOLUS (SEPSIS)
500.0000 mL | Freq: Once | INTRAVENOUS | Status: AC
  Administered 2016-06-27: 500 mL via INTRAVENOUS

## 2016-06-27 MED ORDER — POTASSIUM PHOSPHATES 15 MMOLE/5ML IV SOLN
30.0000 mmol | Freq: Once | INTRAVENOUS | Status: AC
  Administered 2016-06-27: 30 mmol via INTRAVENOUS
  Filled 2016-06-27: qty 10

## 2016-06-27 MED ORDER — ORAL CARE MOUTH RINSE: 15.0000 mL | Freq: Two times a day (BID) | OROMUCOSAL | Status: DC

## 2016-06-27 MED ORDER — VANCOMYCIN HCL IN DEXTROSE 1-5 GM/200ML-% IV SOLN
1000.0000 mg | Freq: Three times a day (TID) | INTRAVENOUS | Status: DC
  Administered 2016-06-28 – 2016-06-30 (×8): 1000 mg via INTRAVENOUS
  Filled 2016-06-27 (×10): qty 200

## 2016-06-27 MED ORDER — VANCOMYCIN HCL 10 G IV SOLR
1500.0000 mg | Freq: Once | INTRAVENOUS | Status: AC
  Administered 2016-06-27: 1500 mg via INTRAVENOUS
  Filled 2016-06-27: qty 1500

## 2016-06-27 MED ORDER — CHLORHEXIDINE GLUCONATE 0.12 % MT SOLN
15.0000 mL | Freq: Two times a day (BID) | OROMUCOSAL | Status: DC
  Administered 2016-06-27 – 2016-07-04 (×15): 15 mL via OROMUCOSAL
  Filled 2016-06-27 (×10): qty 15

## 2016-06-27 MED ORDER — PIPERACILLIN-TAZOBACTAM 3.375 G IVPB
3.3750 g | Freq: Three times a day (TID) | INTRAVENOUS | Status: DC
  Administered 2016-06-27 – 2016-06-30 (×9): 3.375 g via INTRAVENOUS
  Filled 2016-06-27 (×13): qty 50

## 2016-06-27 MED ORDER — MAGNESIUM SULFATE 2 GM/50ML IV SOLN
2.0000 g | Freq: Once | INTRAVENOUS | Status: AC
Start: 2016-06-27 — End: 2016-06-27
  Administered 2016-06-27: 2 g via INTRAVENOUS
  Filled 2016-06-27: qty 50

## 2016-06-27 NOTE — Progress Notes (Signed)
PULMONARY / CRITICAL CARE MEDICINE   Name: Nathan Frank MRN: [id number] DOB: 28-Jul-1941    ADMISSION DATE:  06/25/2016 CONSULTATION DATE:  06/25/2016  REFERRING MD:  Cristobal Goldmann / Neuro  CHIEF COMPLAINT: vent management   BRIEF:  75 year old male w/ sig medical h/o: CAD, CHF (EF 40-45%), GIB, Cirrhosis, Hep C and hepatocellular carcinoma, thrombocytopenia and ETOH abuse. Admitted to the ER on 9/21 after being found on floor w/ left sided facial droop, left sided weakness and dysarthria. He was last seen in [facility] around 2200 the evening prior. In ER he was evaluated by stroke team, dx eval c/w right MCA infarct. He was transferred to interventional radiology where he underwent thrombectomy of the R MCA clot. He returned to the ICU on full vent support. PCCM asked to assist w/ care.    STUDIES:  CT brain/perfusion 9/21: Right MCA infarct.    MICROBIOLOGY: None  ANTIBIOTICS: None  SIGNIFICANT EVENTS: R MCA thrombectomy per IR 9/21 >   LINES/TUBES: OETT 8.0 9/21 >> R Radial Art Line 9/21 >> Foley 9/21 >> OGT 9/21 >> PIV x3   EVENTS 9/22: No acute events overnight. Sheath pulled this morning.    SUBJECTIVE/OVERNIGHT/INTERVAL HX 06/27/16 - doing sbt. Got gag and cough per RN    VITAL SIGNS: BP 125/80   Pulse (!) 102   Temp 99.4 F (37.4 C) (Oral)   Resp (!) 27   Ht 5\' 10"  (1.778 m)   Wt 91.5 kg (201 lb 11.5 oz)   SpO2 100%   BMI 28.94 kg/m   HEMODYNAMICS:    VENTILATOR SETTINGS: Vent Mode: PSV;CPAP FiO2 (%):  [40 %] 40 % Set Rate:  [14 bmp] 14 bmp Vt Set:  [580 mL] 580 mL PEEP:  [5 cmH20] 5 cmH20 Pressure Support:  [5 cmH20] 5 cmH20 Plateau Pressure:  [14 cmH20-18 cmH20] 15 cmH20  INTAKE / OUTPUT: I/O last 3 completed shifts: In: 3601.9 [I.V.:3056.9; Other:40; NG/GT:200; IV Piggyback:305] Out: 1110 [Urine:835; Emesis/NG output:100; Other:175]  PHYSICAL EXAMINATION: General:  No distress. Eyes open. No family at bedside. Neuro:  Awake, stares, gag +,  cough +  Pupils symmetric. Questionable pathologic reflexes in lower extremities. Weaker on left HEENT:  OETT in place. Moist mucus membranes.  Cardiovascular:  Regular rate. No edema. No appreciable JVD. Lungs:  Clear to auscultation bilaterally. Symmetric chest wall rise on ventilator. Abdomen:  Soft. Nondistended. Hypoactive bowel sounds. Musculoskeletal:  No joint deformity or effusion appreciated. Skin:  Warm and dry. No rash on exposed skin.  LABS:  BMET  Recent Labs Lab 06/25/16 0923 06/25/16 1241 06/26/16 0500 06/27/16 0219  NA 141 144 139 142  K 3.9 4.1 4.0 3.8  CL 112* 110 114* 115*  CO2 20*  --  18* 17*  BUN <5* <3* 5* 7  CREATININE 0.81 0.60* 0.84 0.79  GLUCOSE 113* 111* 127* 123*    Electrolytes  Recent Labs Lab 06/25/16 0923 06/26/16 0500 06/26/16 1317 06/27/16 0219 06/27/16 0608  CALCIUM 8.3* 7.4*  --  7.5*  --   MG  --   --  1.3*  --  1.7  PHOS  --   --   --  2.1*  --     CBC  Recent Labs Lab 06/25/16 0923  06/26/16 0500 06/26/16 1700 06/27/16 0608  WBC 4.9  --  9.7  --  11.4*  HGB 14.1  < > 10.6* 10.2* 10.4*  HCT 42.2  < > 31.3* 31.3* 31.1*  PLT 222  --  184  --  115*  < > = values in this interval not displayed.  Coag's  Recent Labs Lab 06/25/16 0923  APTT 27  INR 1.25    Sepsis Markers No results for input(s): LATICACIDVEN, PROCALCITON, O2SATVEN in the last 168 hours.  ABG  Recent Labs Lab 06/25/16 1940  PHART 7.355  PCO2ART 30.6*  PO2ART 359*    Liver Enzymes  Recent Labs Lab 06/25/16 0923 06/27/16 0219  AST 75*  --   ALT 39  --   ALKPHOS 78  --   BILITOT 1.0  --   ALBUMIN 2.5* 1.9*    Cardiac Enzymes No results for input(s): TROPONINI, PROBNP in the last 168 hours.  Glucose  Recent Labs Lab 06/25/16 1010  GLUCAP 108*    Imaging Mr Brain Wo Contrast  Result Date: 06/26/2016 CLINICAL DATA:  Right MCA stroke. EXAM: MRI HEAD WITHOUT CONTRAST TECHNIQUE: Multiplanar, multiecho pulse sequences of the  brain and surrounding structures were obtained without intravenous contrast. COMPARISON:  Head CT 06/25/2016 FINDINGS: Brain: As seen on recent CT, there is a moderately large acute right MCA territory infarct. There is greatest involvement of the temporal and parietal lobes. The insula is diffusely involved, and there is also involvement of the right frontal lobe (predominantly the operculum) as well as basal ganglia. Basal ganglia involvement was not evident on yesterday's CT. There is associated cytotoxic edema with regional sulcal effacement but no midline shift. No associated intracranial hemorrhage is identified. Chronic right frontal on left parietal infarcts are again noted. There is mild generalized cerebral atrophy. No mass lesion. Small crescentic extra-axial collection or dural thickening in the anterior left frontal region measuring 5 mm in thickness demonstrating moderate T2 hyperintensity, intermediate FLAIR signal, and T1 hypointensity. Vascular: Major intracranial vascular flow voids are preserved. Skull and upper cervical spine: Unremarkable bone marrow signal. Sinuses/Orbits: Unremarkable orbits. Mild bilateral ethmoid sinus mucosal thickening/ small volume fluid. Small effusions/mild mucosal edema in the mastoid air cells bilaterally. Other: Partially visualized endotracheal and enteric tubes. Enteric tube is partially looped in the pharynx, and there is fluid in the pharynx. IMPRESSION: 1. Acute moderately large nonhemorrhagic right MCA infarct. 2. Chronic right frontal left parietal infarcts. 3. Small chronic extra-axial collection or dural thickening in the anterior left frontal region. Electronically Signed   By: Logan Bores M.D.   On: 06/26/2016 18:50     ASSESSMENT / PLAN: NEUROLOGIC A:   Acute Right MCA CVA - S/P Embolectomy. Sedation on Ventilator  - awake on diprivan  P:   RASS goal: 0 to -1 Dc Propofol gtt Care per Neurology/Neuro IR Thiamine VT daily MRI Brain  pending  PULMONARY A: Acute Respiratory Failure - Intubated for procedure.    - doing sbt. Cough +, gag +.   P:   extucate after diprivan dc  CARDIOVASCULAR A:  H/o Torsades - QTc 520ms 9/21. H/o CAD H/o systolic CM (EF [id number]) H/o HTN   - on monitor qtc 06/27/2016 is 441msec  P:  Repeat 12leakd EKG 06/28/16 Continuous Telemetry Monitoring  Vitals per uni tprotocol Goal SBP 120-140 per Neuro IR Resume ASA when ok w/ neuro  Will hold his home lasix, lopressor and spironolactone for now  RENAL A:   Mild low mag but in setting of long qtc  P:   Replete mag Monitoring UOP Trending electrolytes & renal function daily Replacing electrolytes as indicated   GASTROINTESTINAL A:   H/o Hep C and Cirrhosis  H/o GIB w/ nsaids GERD Questionable  H/O pancreatitis (on Creon at home)  P:   Carafate VT q6hr NPO Holding on Tube Feedings  HEMATOLOGIC A:   H/o thrombocytopenia (currently WNL) Anemia - Mild. No signs of active bleeding. Likely due to blood loss with aspiration during thrombectomy.  P:  SCDs Anticoagulation per neurology  Trending cell counts w/ CBC Careful monitoring if antiplatelet therapy initiated given h/o GIB    INFECTIOUS A:   No acute Issues  P:   Monitoring for signs of infection.  ENDOCRINE A:   Hypothyroidism - TSH 14.387 & Free T4 0.59. Hyperglycemia - Mild. No h/o DM.  P:   Synthroid VT daily Monitor glucose on daily chemistry  FAMILY  - Updates:  No family at bedside 9/22. And 06/27/16  - Inter-disciplinary family meet or Palliative Care meeting due by:  9/28  TODAY'S SUMMARY:   Extubate. Rx mag in setting of lng qtc which has improved after mag repletion yesterday   I have spent a total of 31 minutes of critical care time today caring for the patient and reviewing the patient's electronic medical record.   Dr. Brand Males, M.D., [facility].C.P Pulmonary and Critical Care Medicine Staff Physician [facility] Pager: [phone], If no answer or between  15:00h - 7:00h: call [phone]  06/27/2016 12:02 PM

## 2016-06-27 NOTE — Progress Notes (Signed)
SLP Cancellation Note  Patient Details Name: Nathan Frank MRN: [id number] DOB: 16-Nov-1940   Cancelled treatment:       Reason Eval/Treat Not Completed: Medical issues which prohibited therapy (Pt remains intubated.  ST will continue efforts.  )   Shelly Flatten, MA, [facility] SLP [phone]  Lamar Sprinkles 06/27/2016, 7:09 AM

## 2016-06-27 NOTE — Progress Notes (Signed)
Per note by Dr. Ancil Linsey, flutter valve was initiated.  Pt shows poor to no effort to almost not being clear on how to follow the directions given.  Pt has very mobile secretions that would not indicate CPT but flutter would help mobilze them to avoid possible intubation due to inability to clear and protect airway.  RT suctioned pts mouth and throat numerous times but pt has a weak cough and is having difficulty bringing up the secretions.  RT will monitor.

## 2016-06-27 NOTE — Progress Notes (Signed)
Pharmacy Antibiotic Note  Nathan Frank is a 75 y.o. male admitted on 06/25/2016 with fever.  Pharmacy has been consulted for vancomycin and zosyn dosing. Tmax is 101.9 and WBC is elevated at 11.4. SCr is WNL.   Plan: - Vanc 1500mg  IV x 1 then 1gm IV Q8H  - Zosyn 3.375gm IV Q8H (4 hr inf) - F/u renal fxn, C&S, clinical status and trough at SS  Height: 5\' 10"  (177.8 cm) Weight: 201 lb 11.5 oz (91.5 kg) IBW/kg (Calculated) : 73  Temp (24hrs), Avg:99.7 F (37.6 C), Min:98.7 F (37.1 C), Max:101.5 F (38.6 C)   Recent Labs Lab 06/25/16 0923 06/25/16 1241 06/26/16 0500 06/27/16 0219 06/27/16 0608  WBC 4.9  --  9.7  --  11.4*  CREATININE 0.81 0.60* 0.84 0.79  --     Estimated Creatinine Clearance: 90.7 mL/min (by C-G formula based on SCr of 0.79 mg/dL).    No Known Allergies  Antimicrobials this admission: Vanc 9/23>> Zosyn 9/23>>  Dose adjustments this admission: N/A  Microbiology results: Pending  Thank you for allowing pharmacy to be a part of this patient's care.  Ronnica Dreese, Rande Lawman 06/27/2016 8:17 PM

## 2016-06-27 NOTE — Procedures (Signed)
Extubation Procedure Note  Patient Details:   Name: Nathan Frank DOB: Feb 29, 1941 MRN: [id number]   Airway Documentation:     Evaluation  O2 sats: stable throughout Complications: No apparent complications Patient did tolerate procedure well. Bilateral Breath Sounds: Clear   Pt extubated per MD order at this time, + cuff leak, placed on 4L [place], no distress noted, pt able to voice  Ciro Backer 06/27/2016, 12:22 PM

## 2016-06-27 NOTE — Progress Notes (Signed)
[facility] Progress Note Patient Name: Nathan Frank DOB: 1940-12-13 MRN: [id number]   Date of Service  06/27/2016  HPI/Events of Note  Multiple issues: 1. Fever to 101.9 F - Sputum Cx already sent and 2. Oliguria - LVEF = 40%-45%.  eICU Interventions  Will order: 1. UA. 2. Blood Cultures X 2 now.  3. Vancomycin and Zosyn per pharmacy consultation.  4. Bolus with 0.9 NaCl 500 mL IV over 30 minutes now.      Intervention Category Major Interventions: Infection - evaluation and management  [name] 06/27/2016, 8:11 PM

## 2016-06-27 NOTE — Progress Notes (Signed)
STROKE TEAM PROGRESS NOTE   HISTORY OF PRESENT ILLNESS (per record) Nathan Frank is an 75 y.o. male with hx of CAD, CHF (EF 40-45%), GIB, Cirrhosis, Hep C and hepatocellular carcinoma, thrombocytopenia and ETOH abuse presents after falling in the middle of the night and having L sided weakness and L facial droop along with dysarthria. He was last known well 06/24/16 at 11pm. Patient was not administered IV t-PA secondary to delay in arrival. CT perfusion showed salvageable defect. He was taken to IR where he received TICI 3 revascularization of occluded R MCA using solitaire, penumbra and IA integrilin He was admitted to the neuro ICU for further evaluation and treatment.    SUBJECTIVE (INTERVAL HISTORY) No family at bedside. Groin sheath removed. Intubated. Alert and following commands.    OBJECTIVE Temp:  [99 F (37.2 C)-99.6 F (37.6 C)] 99.5 F (37.5 C) (09/23 0400) Pulse Rate:  [79-101] 85 (09/23 0700) Cardiac Rhythm: Normal sinus rhythm (09/22 2000) Resp:  [13-31] 17 (09/23 0700) BP: (106-136)/(66-93) 109/69 (09/23 0700) SpO2:  [100 %] 100 % (09/23 0700) Arterial Line BP: (111-127)/(55-83) 111/83 (09/22 1230) FiO2 (%):  [40 %] 40 % (09/23 0600)  CBC:  Recent Labs Lab 06/25/16 0923  06/26/16 0500 06/26/16 1700  WBC 4.9  --  9.7  --   NEUTROABS 2.9  --  7.8*  --   HGB 14.1  < > 10.6* 10.2*  HCT 42.2  < > 31.3* 31.3*  MCV 98.8  --  98.4  --   PLT 222  --  184  --   < > = values in this interval not displayed.  Basic Metabolic Panel:   Recent Labs Lab 06/26/16 0500 06/26/16 1317 06/27/16 0219  NA 139  --  142  K 4.0  --  3.8  CL 114*  --  115*  CO2 18*  --  17*  GLUCOSE 127*  --  123*  BUN 5*  --  7  CREATININE 0.84  --  0.79  CALCIUM 7.4*  --  7.5*  MG  --  1.3*  --   PHOS  --   --  2.1*    Lipid Panel:     Component Value Date/Time   CHOL 87 06/26/2016 0500   TRIG 91 06/27/2016 0219   HDL 26 (L) 06/26/2016 0500   CHOLHDL 3.3 06/26/2016 0500   VLDL 23 06/26/2016 0500   LDLCALC 38 06/26/2016 0500   HgbA1c:  Lab Results  Component Value Date   HGBA1C 4.5 (L) 06/26/2016   Urine Drug Screen:     Component Value Date/Time   LABOPIA POSITIVE (A) 06/09/2016 2059   COCAINSCRNUR NONE DETECTED 06/09/2016 2059   COCAINSCRNUR NEGATIVE 12/13/2014 2353   LABBENZ POSITIVE (A) 06/09/2016 2059   LABBENZ POSITIVE (A) 12/13/2014 2353   AMPHETMU NONE DETECTED 06/09/2016 2059   THCU NONE DETECTED 06/09/2016 2059   LABBARB NONE DETECTED 06/09/2016 2059      IMAGING   MRI brain without contrast 06/26/2016 1. Acute moderately large nonhemorrhagic right MCA infarct. 2. Chronic right frontal left parietal infarcts. 3. Small chronic extra-axial collection or dural thickening in the anterior left frontal region.    Ct Head Code Stroke Wo Contrast 06/25/2016 1. Evolving moderate-sized acute right MCA infarct. No evidence of acute intracranial hemorrhage.  2. Chronic ischemic changes as above. 3. Small volume extra-axial material over the anterior left frontal lobe, present since 2014. This may reflect dural thickening nor a chronic, complex subdural collection. No  mass effect.   Ir US Guide Vasc Access Left / Ir Radiology Peripheral Guided Iv Start 06/25/2016 Status post ultrasound-guided venipuncture of the left basilic vein.   Ct Cerebral Perfusion W Contrast 06/25/2016 Right MCA infarct.  Core infarct 55 mL.  Penumbra 152 mL    Cerebral Angio 06/25/2016 S/P Rt common carotid arteriogram,followed by complete revascularization of occluded RT MCA prox with x 3 passes with solitaire 4 mm x 40 mm FR retrieval device ,and ACE penumbra aspiration device, and 7.2 mg of IA integrelin superselectively achieving a TICI 3 reperfusion   Ct Head Wo Contrast post IR 06/25/2016 Dense right MCA with generalized decreased attenuation involving the right parietal and temporal lobes consistent with acute ischemia. Areas of subacute to chronic ischemia  as described. No acute hemorrhage is noted.   Dg Chest Port 1 View 06/25/2016 1. Endotracheal tube seen ending 4-5 cm above the carina. 2. Mild right midlung and left basilar atelectasis noted.     PHYSICAL EXAM  Exam: Gen: NAD, alert, intubated not sedated, follows simple commands Eyes: anicteric sclerae, moist conjunctivae                    CV: no MRG, no carotid bruits, no peripheral edema  Neuro: Detailed Neurologic Exam  Speech:   intubated  Cranial Nerves:    The pupils are equal, round, and reactive to light.. Attempted, Fundi not visualized.  Gaze conjugate, right gave preference. Left facial weakness, Blinks to threat on the right. Can wiggle toes on the right and lift right arm antigravity to command,   Hearing intact to voice.   Motor Observation:    no involuntary movements noted. Tone appears normal.     Strength:    Left hemiparesis can move arm and leg minimally on command     Sensation:  Intact to LT  Plantars left upgoing.    ASSESSMENT/PLAN [name] is a 75 y.o. male with history of CAD, CHF (EF 40-45%), GIB, Cirrhosis, Hep C and hepatocellular carcinoma, thrombocytopenia and ETOH abuse presenting with L hemiparesis, L facial and dysarthria. He did not receive IV t-PA due to delay in arrival. He was taken to IR where he received TICI 3 revascularization of occluded R MCA using solitaire, penumbra and IA integrilin.   Stroke:  Non-dominant large right MCA infarct s/p TICI 3 revascularization of occluded R MCA with mechanical thrombectomy and IA Integrilin, infarct embolic secondary to unknown source    MRI - Acute moderately large nonhemorrhagic right MCA infarct. Will order CTA head and neck  2D Echo - EF 40-45%.   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 Pancreatitis , on Creon  Hx thrombocytopenia, WNL now  Hypothyroidism on synthroid  UDS - benzodiazepines and opiates           To contact Stroke Continuity provider, please refer to [email or web address] After hours, contact General Neurology

## 2016-06-27 NOTE — Progress Notes (Signed)
Called by elink to bedside to evaluate for poor cough, concern for impending respiratory failure. Patient with poor cough effort (poor ability to close glottis), pt c/o of significant secretion buildup around glottis. With some coaching, pt was able to expectorate some purulent sputum, which was collected for micro.  Plan: increase pulmonary toilet w/ bed percussion module and oscillatory PEP valve. F/u resp culture.  Luz Brazen, MD Pulmonary & Critical Care Medicine June 27, 2016, 7:26 PM

## 2016-06-27 NOTE — Progress Notes (Signed)
eLink Physician-Brief Progress Note Patient Name: Nathan Frank DOB: 1941-01-27 MRN: [id number]   Date of Service  06/27/2016  HPI/Events of Note  Patient has weak cough. All sedation is off.   eICU Interventions  Will order: 1. NT suction PRN.  Will watch closely for need to reintubate.      Intervention Category Intermediate Interventions: Respiratory distress - evaluation and management  Taesha Goodell [place] 06/27/2016, 6:54 PM

## 2016-06-27 NOTE — Progress Notes (Signed)
[facility] Progress Note Patient Name: Nathan Frank DOB: Jul 29, 1941 MRN: [id number]   Date of Service  06/27/2016  HPI/Events of Note  Hypophosphatemia and moderately low potassium   eICU Interventions  Phos and potassium replaced     Intervention Category Minor Interventions: Electrolytes abnormality - evaluation and management  Lorelei Heikkila 06/27/2016, 3:38 AM

## 2016-06-28 ENCOUNTER — Inpatient Hospital Stay (HOSPITAL_COMMUNITY): Payer: Medicare Other

## 2016-06-28 LAB — RENAL FUNCTION PANEL
ALBUMIN: 1.8 g/dL — AB (ref 3.5–5.0)
ALBUMIN: 1.8 g/dL — AB (ref 3.5–5.0)
Anion gap: 5 (ref 5–15)
Anion gap: 6 (ref 5–15)
BUN: 8 mg/dL (ref 6–20)
BUN: 8 mg/dL (ref 6–20)
CALCIUM: 7.3 mg/dL — AB (ref 8.9–10.3)
CHLORIDE: 118 mmol/L — AB (ref 101–111)
CO2: 17 mmol/L — ABNORMAL LOW (ref 22–32)
CO2: 18 mmol/L — ABNORMAL LOW (ref 22–32)
CREATININE: 0.82 mg/dL (ref 0.61–1.24)
CREATININE: 0.8 mg/dL (ref 0.61–1.24)
Calcium: 7.4 mg/dL — ABNORMAL LOW (ref 8.9–10.3)
Chloride: 117 mmol/L — ABNORMAL HIGH (ref 101–111)
GFR calc Af Amer: >60 mL/min (ref >60)
GFR calc non Af Amer: >60 mL/min (ref >60)
GLUCOSE: 110 mg/dL — AB (ref 65–99)
Glucose, Bld: 110 mg/dL — ABNORMAL HIGH (ref 65–99)
PHOSPHORUS: 2.7 mg/dL (ref 2.5–4.6)
POTASSIUM: 3.9 mmol/L (ref 3.5–5.1)
Phosphorus: 2.6 mg/dL (ref 2.5–4.6)
Potassium: 3.9 mmol/L (ref 3.5–5.1)
Sodium: 140 mmol/L (ref 135–145)
Sodium: 141 mmol/L (ref 135–145)

## 2016-06-28 LAB — URINE MICROSCOPIC-ADD ON

## 2016-06-28 LAB — URINALYSIS, ROUTINE W REFLEX MICROSCOPIC
BILIRUBIN URINE: NEGATIVE
GLUCOSE, UA: 100 mg/dL — AB
KETONES UR: 15 mg/dL — AB
NITRITE: POSITIVE — AB
PH: 5 (ref 5.0–8.0)
PROTEIN: 100 mg/dL — AB
Specific Gravity, Urine: >1.03 — ABNORMAL HIGH (ref 1.005–1.030)

## 2016-06-28 LAB — CBC WITH DIFFERENTIAL/PLATELET
BASOS ABS: 0 10*3/uL (ref 0.0–0.1)
BASOS PCT: 0 %
EOS ABS: 0 10*3/uL (ref 0.0–0.7)
EOS PCT: 0 %
HCT: 31.1 % — ABNORMAL LOW (ref 39.0–52.0)
Hemoglobin: 10.3 g/dL — ABNORMAL LOW (ref 13.0–17.0)
Lymphocytes Relative: 6 %
Lymphs Abs: 0.7 10*3/uL (ref 0.7–4.0)
MCH: 33 pg (ref 26.0–34.0)
MCHC: 33.1 g/dL (ref 30.0–36.0)
MCV: 99.7 fL (ref 78.0–100.0)
MONO ABS: 0.8 10*3/uL (ref 0.1–1.0)
Monocytes Relative: 7 %
Neutro Abs: 9.8 10*3/uL — ABNORMAL HIGH (ref 1.7–7.7)
Neutrophils Relative %: 87 %
PLATELETS: 117 10*3/uL — AB (ref 150–400)
RBC: 3.12 MIL/uL — AB (ref 4.22–5.81)
RDW: 16.1 % — AB (ref 11.5–15.5)
WBC: 11.3 10*3/uL — AB (ref 4.0–10.5)

## 2016-06-28 LAB — MAGNESIUM: MAGNESIUM: 1.8 mg/dL (ref 1.7–2.4)

## 2016-06-28 MED ORDER — DEXTROSE-NACL 5-0.45 % IV SOLN
INTRAVENOUS | Status: DC
  Administered 2016-06-28: 12:00:00 via INTRAVENOUS

## 2016-06-28 MED ORDER — MAGNESIUM SULFATE 2 GM/50ML IV SOLN
2.0000 g | Freq: Once | INTRAVENOUS | Status: AC
Start: 2016-06-28 — End: 2016-06-28
  Administered 2016-06-28: 2 g via INTRAVENOUS
  Filled 2016-06-28: qty 50

## 2016-06-28 MED ORDER — IOPAMIDOL (ISOVUE-370) INJECTION 76%
INTRAVENOUS | Status: AC
  Administered 2016-06-28: 50 mL
  Filled 2016-06-28: qty 50

## 2016-06-28 MED ORDER — METOPROLOL TARTRATE 5 MG/5ML IV SOLN
2.5000 mg | INTRAVENOUS | Status: DC | PRN
  Administered 2016-06-28: 5 mg via INTRAVENOUS
  Filled 2016-06-28 (×2): qty 5

## 2016-06-28 MED ORDER — METOPROLOL TARTRATE 5 MG/5ML IV SOLN
INTRAVENOUS | Status: AC
  Filled 2016-06-28: qty 5

## 2016-06-28 NOTE — Evaluation (Signed)
Physical Therapy Evaluation Patient Details Name: Nathan Frank MRN: [id number] DOB: [birth year] Today's Date: 06/28/2016   History of Present Illness  75 year old male w/ sig medical h/o: CAD, CHF (EF 40-45%), GIB, Cirrhosis, Hep C and hepatocellular carcinoma, thrombocytopenia and ETOH abuse. Admitted to the ER on 9/21 after being found on floor w/ left sided facial droop, left sided weakness and dysarthria. Dx with right MCA infarct. He was transferred to interventional radiology where he underwent thrombectomy of the R MCA clot. He returned to the ICU on full vent support. Extubated 9/23.      Clinical Impression  Pt admitted with above diagnosis. Pt currently with functional limitations due to the deficits listed below (see PT Problem List). Patient with good awareness of his deficits and demonstrated good safety awareness. Anticipate good progress with therapies.  Pt will benefit from skilled PT to increase their independence and safety with mobility to allow discharge to the venue listed below.       Follow Up Recommendations CIR    Equipment Recommendations  None recommended by PT    Recommendations for Other Services OT consult;Rehab consult     Precautions / Restrictions Precautions Precautions: Fall      Mobility  Bed Mobility Overal bed mobility: Needs Assistance Bed Mobility: Rolling;Sidelying to Sit;Sit to Sidelying Rolling: Min assist Sidelying to sit: Mod assist;HOB elevated     Sit to sidelying: Min assist General bed mobility comments: + rail; rolled bil for cleaning (incontinent of BM on arrival); requires step by step cues  Transfers Overall transfer level: Needs assistance Equipment used: None Transfers: Sit to/from Stand Sit to Stand: Mod assist;From elevated surface (ICU bed)         General transfer comment: Stood without knee buckling, however flexed at hips and through torso; unsafe to pivot with +1 assist  Ambulation/Gait                 Stairs            Wheelchair Mobility    Modified Rankin (Stroke Patients Only) Modified Rankin (Stroke Patients Only) Pre-Morbid Rankin Score: Moderate disability Modified Rankin: Severe disability     Balance Overall balance assessment: Needs assistance;History of Falls (fell ~1 year ago) Sitting-balance support: No upper extremity supported;Feet supported Sitting balance-Leahy Scale: Good     Standing balance support: No upper extremity supported Standing balance-Leahy Scale: Poor Standing balance comment: slight posterior lean                             Pertinent Vitals/Pain BP supine 115/72, SaO2 97% on 2L (93% on RA)       Sit        138/76            93% on RA  Pain Assessment: No/denies pain    Home Living Family/patient expects to be discharged to:: Private residence Living Arrangements: Spouse/significant other Available Help at Discharge: Family;Available 24 hours/day Type of Home: House Home Access: Stairs to enter Entrance Stairs-Rails: Right;Left;Can reach both Entrance Stairs-Number of Steps: 4 Home Layout: One level Home Equipment: Cane - single point;Grab bars - tub/shower;Hand held shower head;Walker - 2 wheels      Prior Function Level of Independence: Independent with assistive device(s)         Comments: Used SPC for mobility but recently has been using RW      Hand Dominance   Dominant Hand: Right  Extremity/Trunk Assessment   Upper Extremity Assessment: Defer to OT evaluation (some AROM shoulder, elbow, 2nd digit)           Lower Extremity Assessment: RLE deficits/detail;LLE deficits/detail RLE Deficits / Details: +edema; AROM WFL; strength 5/5 LLE Deficits / Details: AROM WfL; hip flexion 3, hip extension 3+, knee extension 4, ankle DF 5  Cervical / Trunk Assessment: Other exceptions  Communication   Communication: No difficulties  Cognition Arousal/Alertness: Awake/alert Behavior During Therapy: WFL  for tasks assessed/performed Overall Cognitive Status: Impaired/Different from baseline Area of Impairment: Orientation;Problem solving;Following commands Orientation Level: Time (+month, +day but year 1; +[facility]; +stroke)     Following Commands: Follows one step commands with increased time     Problem Solving: Slow processing;Difficulty sequencing;Requires verbal cues;Requires tactile cues General Comments: difficulty moving himself with bed mobility and required frequent cues for sequencing    General Comments      Exercises     Assessment/Plan    PT Assessment Patient needs continued PT services  PT Problem List Decreased strength;Decreased balance;Decreased mobility;Decreased cognition;Decreased knowledge of use of DME;Impaired sensation;Obesity          PT Treatment Interventions DME instruction;Gait training;Stair training;Functional mobility training;Therapeutic exercise;Therapeutic activities;Balance training;Patient/family education;Neuromuscular re-education;Cognitive remediation    PT Goals (Current goals can be found in the Care Plan section)  Acute Rehab PT Goals Patient Stated Goal: To go home PT Goal Formulation: With patient Time For Goal Achievement: 07/05/16 Potential to Achieve Goals: Good    Frequency Min 4X/week   Barriers to discharge        Co-evaluation               End of Session Equipment Utilized During Treatment: Gait belt Activity Tolerance: Patient tolerated treatment well Patient left: in bed;with call bell/phone within reach;with bed alarm set;with SCD's reapplied Nurse Communication: Mobility status;Other (comment) (incontinent of BM)         Time: 1235-1316 PT Time Calculation (min) (ACUTE ONLY): 41 min   Charges:   PT Evaluation $PT Eval High Complexity: 1 Procedure PT Treatments $Therapeutic Activity: 23-37 mins   PT G Codes:        Kingston Guiles 2016/07/09, 1:33 PM Pager [phone]

## 2016-06-28 NOTE — Progress Notes (Signed)
Rehab Admissions Coordinator Note:  Patient was screened by [name] for appropriateness for an Inpatient Acute Rehab Consult.  At this time, we are recommending Inpatient Rehab consult.  [name] 06/28/2016, 5:07 PM  I can be reached at [phone].

## 2016-06-28 NOTE — Progress Notes (Signed)
Called CCM [facility]) to inform that patient failed his swallow study. Orders for cortrak and nutrition consult given.

## 2016-06-28 NOTE — Progress Notes (Signed)
PULMONARY / CRITICAL CARE MEDICINE   Name: Nathan Frank MRN: [id number] DOB: Apr 22, 1941    ADMISSION DATE:  06/25/2016 CONSULTATION DATE:  06/25/2016  REFERRING MD:  Cristobal Goldmann / Neuro  CHIEF COMPLAINT: vent management   BRIEF:  75 year old male w/ sig medical h/o: CAD, CHF (EF 40-45%), GIB, Cirrhosis, Hep C and hepatocellular carcinoma, thrombocytopenia and ETOH abuse. Admitted to the ER on 9/21 after being found on floor w/ left sided facial droop, left sided weakness and dysarthria. He was last seen in [facility] around 2200 the evening prior. In ER he was evaluated by stroke team, dx eval c/w right MCA infarct. He was transferred to interventional radiology where he underwent thrombectomy of the R MCA clot. He returned to the ICU on full vent support. PCCM asked to assist w/ care.       MICROBIOLOGY: None  ANTIBIOTICS: None     EVENTS R MCA thrombectomy per IR 9/21 >  CT brain/perfusion 9/21: Right MCA infarct.  9/21 - intubated 9/22: No acute events overnight. Sheath pulled this morning.  06/27/16 - doing sbt. Got gag and cough per RN -> EXTUBATED    SUBJECTIVE/OVERNIGHT/INTERVAL HX 06/28/16 - doing well post extubation. Working with speech on swallow. QTC 427msec and normalized after mag replacement  VITAL SIGNS: BP 132/76   Pulse (!) 108   Temp 98.3 F (36.8 C) (Oral)   Resp (!) 35   Ht 5\' 10"  (1.778 m)   Wt 91.5 kg (201 lb 11.5 oz)   SpO2 100%   BMI 28.94 kg/m   HEMODYNAMICS:    VENTILATOR SETTINGS: Vent Mode: PSV;CPAP FiO2 (%):  [40 %] 40 % PEEP:  [5 cmH20] 5 cmH20 Pressure Support:  [5 cmH20] 5 cmH20  INTAKE / OUTPUT: I/O last 3 completed shifts: In: 4267.8 [I.V.:2862.8; IV [id number] Out: [id number] [Urine:850; Emesis/NG output:100; Other:175]  PHYSICAL EXAMINATION: General:  No distress. Eyes open. No family at bedside. Neuro:  Awake, Following some commands. Working with speech therapy HEENT: Moist mucus membranes.  Cardiovascular:  Regular rate.  No edema. No appreciable JVD. Lungs:  Clear to auscultation bilaterally.  Abdomen:  Soft. Nondistended. Hypoactive bowel sounds. Musculoskeletal:  No joint deformity or effusion appreciated. Skin:  Warm and dry. No rash on exposed skin.  LABS:  BMET  Recent Labs Lab 06/26/16 0500 06/27/16 0219 06/28/16 0305  NA 139 142 141  140  K 4.0 3.8 3.9  3.9  CL 114* 115* 118*  117*  CO2 18* 17* 18*  17*  BUN 5* 7 8  8   CREATININE 0.84 0.79 0.82  0.80  GLUCOSE 127* 123* 110*  110*    Electrolytes  Recent Labs Lab 06/26/16 0500 06/26/16 1317 06/27/16 0219 06/27/16 0608 06/28/16 0305  CALCIUM 7.4*  --  7.5*  --  7.4*  7.3*  MG  --  1.3*  --  1.7 1.8  PHOS  --   --  2.1*  --  2.6  2.7    CBC  Recent Labs Lab 06/26/16 0500 06/26/16 1700 06/27/16 0608 06/28/16 0305  WBC 9.7  --  11.4* 11.3*  HGB 10.6* 10.2* 10.4* 10.3*  HCT 31.3* 31.3* 31.1* 31.1*  PLT 184  --  115* 117*    Coag's  Recent Labs Lab 06/25/16 0923  APTT 27  INR 1.25    Sepsis Markers No results for input(s): LATICACIDVEN, PROCALCITON, O2SATVEN in the last 168 hours.  ABG  Recent Labs Lab 06/25/16 1940  PHART 7.355  PCO2ART 30.6*  PO2ART 359*    Liver Enzymes  Recent Labs Lab 06/25/16 0923 06/27/16 0219 06/28/16 0305  AST 75*  --   --   ALT 39  --   --   ALKPHOS 78  --   --   BILITOT 1.0  --   --   ALBUMIN 2.5* 1.9* 1.8*  1.8*    Cardiac Enzymes No results for input(s): TROPONINI, PROBNP in the last 168 hours.  Glucose  Recent Labs Lab 06/25/16 1010  GLUCAP 108*    Imaging Ct Angio Head W Or Wo Contrast  Result Date: 06/28/2016 CLINICAL DATA:  Stroke, followup evaluation. Status post revascularization of occluded RIGHT middle cerebral artery. EXAM: CT ANGIOGRAPHY HEAD AND NECK TECHNIQUE: Multidetector CT imaging of the head and neck was performed using the standard protocol during bolus administration of intravenous contrast. Multiplanar CT image  reconstructions and MIPs were obtained to evaluate the vascular anatomy. Carotid stenosis measurements (when applicable) are obtained utilizing NASCET criteria, using the distal internal carotid diameter as the denominator. CONTRAST:  50 cc Isovue 370 COMPARISON:  MRI head June 26, 2016 and FINDINGS: CT HEAD BRAIN: Blurring of the RIGHT frontotemporal parietal gray-white matter differentiation, with patchy RIGHT basal ganglia and corona radiata hypodensity corresponding to known acute infarct. Local sulcal effacement without midline shift. No intraparenchymal hemorrhage. RIGHT frontal and LEFT parietal lobe encephalomalacia. Mild ventriculomegaly, with partially effaced RIGHT temporal horn, no hydrocephalus. No significant extra-axial fluid collections. Minimal LEFT frontal dural thickening or chronic subdural hematoma. Minimally effaced RIGHT basal cisterns. VASCULAR: Mild calcific atherosclerosis of the carotid siphons. SKULL: No skull fracture. Old bilateral distal nasal bone fractures. No significant scalp soft tissue swelling. SINUSES/ORBITS: The mastoid air-cells and included paranasal sinuses are well-aerated.The included ocular globes and orbital contents are non-suspicious. OTHER: None. CTA NECK AORTIC ARCH: Normal appearance of the thoracic arch, normal branch pattern. Mild calcific atherosclerosis. The origins of the innominate, left Common carotid artery and subclavian artery are widely patent. RIGHT CAROTID SYSTEM: Common carotid artery is widely patent, coursing in a straight line fashion. Mild eccentric calcific atherosclerosis of RIGHT internal carotid artery origin. Normal appearance of the carotid bifurcation without hemodynamically significant stenosis by NASCET criteria. 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 No intramural hematoma. LEFT CAROTID SYSTEM: Common carotid artery is widely patent, coursing in a  straight line fashion. Normal appearance of the carotid bifurcation without hemodynamically significant stenosis by NASCET criteria. Normal appearance of the included internal carotid artery. VERTEBRAL ARTERIES:Mild stenosis RIGHT vertebral artery origin. Codominant vertebral arteries. Vertebral arteries are patent throughout the course. Mild extrinsic deformity due to degenerative cervical spine. SKELETON: No acute osseous process though bone windows have not been submitted. Poor dentition. Subcentimeter sclerotic foci T4 and T5 vertebral bodies . OTHER NECK: Soft tissues of the neck are non-acute though, not tailored for evaluation. Lobulated RIGHT pleural effusion extending into the fissure. Small layering LEFT pleural effusion. CTA HEAD ANTERIOR CIRCULATION: Normal appearance of the cervical internal carotid arteries, petrous, cavernous and supra clinoid internal carotid arteries. Widely patent anterior communicating artery. RIGHT A1 segment is dominant. Patent bilateral anterior cerebral arteries. Bilateral middle cerebral arteries are patent. Mild stenosis distal RIGHT M1. Mild luminal irregularity RIGHT M2 segment. Occluded distal RIGHT M3 segment. No large vessel occlusion, hemodynamically significant stenosis, dissection, contrast extravasation or aneurysm. POSTERIOR CIRCULATION: Normal appearance of the vertebral arteries, vertebrobasilar junction and basilar artery, as well as main branch vessels. Normal appearance of the  posterior cerebral arteries. Bilateral posterior communicating arteries present. No large vessel occlusion, hemodynamically significant stenosis, dissection, luminal irregularity, contrast extravasation or aneurysm. VENOUS SINUSES: Major dural venous sinuses are patent though not tailored for evaluation on this angiographic examination. ANATOMIC VARIANTS: None. DELAYED PHASE: Mild RIGHT final cortical enhancement. Asymmetrically dense RIGHT middle cerebral artery segments on delayed  phase. 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 LEFT frontal dural thickening versus chronic subdural hematoma. 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 Small bilateral pleural effusions. Sub cm probable bone islands T4 and T5 though, if there is a history of cancer, recommend bone scan. CTA HEAD: Occluded RIGHT M3 segment. Mild stenosis distal RIGHT M1 segment. Mild luminal irregularity of the RIGHT M2 segment may be due to extrinsic deformity, atherosclerosis or vasospasm. No large vessel occlusion. Electronically Signed   By: Elon Alas M.D.   On: 06/28/2016 07:02   Ct Angio Neck W Or Wo Contrast  Result Date: 06/28/2016 CLINICAL DATA:  Stroke, followup evaluation. Status post revascularization of occluded RIGHT middle cerebral artery. EXAM: CT ANGIOGRAPHY HEAD AND NECK TECHNIQUE: Multidetector CT imaging of the head and neck was performed using the standard protocol during bolus administration of intravenous contrast. Multiplanar CT image reconstructions and MIPs were obtained to evaluate the vascular anatomy. Carotid stenosis measurements (when applicable) are obtained utilizing NASCET criteria, using the distal internal carotid diameter as the denominator. CONTRAST:  50 cc Isovue 370 COMPARISON:  MRI head June 26, 2016 and FINDINGS: CT HEAD BRAIN: Blurring of the RIGHT frontotemporal parietal gray-white matter differentiation, with patchy RIGHT basal ganglia and corona radiata hypodensity corresponding to known acute infarct. Local sulcal effacement without midline shift. No intraparenchymal hemorrhage. RIGHT frontal and LEFT parietal lobe encephalomalacia. Mild ventriculomegaly, with partially effaced RIGHT temporal horn, no hydrocephalus. No significant extra-axial fluid collections. Minimal LEFT frontal dural thickening or chronic  subdural hematoma. Minimally effaced RIGHT basal cisterns. VASCULAR: Mild calcific atherosclerosis of the carotid siphons. SKULL: No skull fracture. Old bilateral distal nasal bone fractures. No significant scalp soft tissue swelling. SINUSES/ORBITS: The mastoid air-cells and included paranasal sinuses are well-aerated.The included ocular globes and orbital contents are non-suspicious. OTHER: None. CTA NECK AORTIC ARCH: Normal appearance of the thoracic arch, normal branch pattern. Mild calcific atherosclerosis. The origins of the innominate, left Common carotid artery and subclavian artery are widely patent. RIGHT CAROTID SYSTEM: Common carotid artery is widely patent, coursing in a straight line fashion. Mild eccentric calcific atherosclerosis of RIGHT internal carotid artery origin. Normal appearance of the carotid bifurcation without hemodynamically significant stenosis by NASCET criteria. 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 No intramural hematoma. LEFT CAROTID SYSTEM: Common carotid artery is widely patent, coursing in a straight line fashion. Normal appearance of the carotid bifurcation without hemodynamically significant stenosis by NASCET criteria. Normal appearance of the included internal carotid artery. VERTEBRAL ARTERIES:Mild stenosis RIGHT vertebral artery origin. Codominant vertebral arteries. Vertebral arteries are patent throughout the course. Mild extrinsic deformity due to degenerative cervical spine. SKELETON: No acute osseous process though bone windows have not been submitted. Poor dentition. Subcentimeter sclerotic foci T4 and T5 vertebral bodies . OTHER NECK: Soft tissues of the neck are non-acute though, not tailored for evaluation. Lobulated RIGHT pleural effusion extending into the fissure. Small layering LEFT pleural effusion. CTA HEAD ANTERIOR CIRCULATION: Normal appearance of the cervical internal  carotid  arteries, petrous, cavernous and supra clinoid internal carotid arteries. Widely patent anterior communicating artery. RIGHT A1 segment is dominant. Patent bilateral anterior cerebral arteries. Bilateral middle cerebral arteries are patent. Mild stenosis distal RIGHT M1. Mild luminal irregularity RIGHT M2 segment. Occluded distal RIGHT M3 segment. No large vessel occlusion, hemodynamically significant stenosis, dissection, contrast extravasation or aneurysm. POSTERIOR CIRCULATION: Normal appearance of the vertebral arteries, vertebrobasilar junction and basilar artery, as well as main branch vessels. Normal appearance of the posterior cerebral arteries. Bilateral posterior communicating arteries present. No large vessel occlusion, hemodynamically significant stenosis, dissection, luminal irregularity, contrast extravasation or aneurysm. VENOUS SINUSES: Major dural venous sinuses are patent though not tailored for evaluation on this angiographic examination. ANATOMIC VARIANTS: None. DELAYED PHASE: Mild RIGHT final cortical enhancement. Asymmetrically dense RIGHT middle cerebral artery segments on delayed phase. 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 LEFT frontal dural thickening versus chronic subdural hematoma. 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 Small bilateral pleural effusions. Sub cm probable bone islands T4 and T5 though, if there is a history of cancer, recommend bone scan. CTA HEAD: Occluded RIGHT M3 segment. Mild stenosis distal RIGHT M1 segment. Mild luminal irregularity of the RIGHT M2 segment may be due to extrinsic deformity, atherosclerosis or vasospasm. No large vessel occlusion. Electronically Signed   By: Elon Alas M.D.   On: 06/28/2016 07:02     ASSESSMENT / PLAN: NEUROLOGIC A:   Acute Right MCA CVA - S/P  Embolectomy.   - awake  And following commands but weakness on left  P:   Care per Neurology/Neuro IR Thiamine VT daily   PULMONARY A: Acute Respiratory Failure - Intubated for procedure. 06/25/2016 , sp extubation 06/27/16   P:   pulm toilet  CARDIOVASCULAR A:  #baseline H/o T[place]ades -  H/o CAD H/o systolic CM (EF [id number]) H/o HTN   #At admit  -Prolonged  QTc 554ms 9/21 with low mag  #Current  - normal Qtc with mag correction but mag still < 2g  P:  Goal SBP 120-140 per Neuro IR Resume ASA when ok w/ neuro  Will hold his home lasix, lopressor and spironolactone for now ; neuro to restart this Mionitor QTc  - ensur mag > 2gm%  RENAL A:   Mild low mag   P:   Replete mag 2gm 06/28/16 - goal > 2gm% Monitoring UOP Trending electrolytes & renal function daily Replacing electrolytes as indicated   GASTROINTESTINAL A:   H/o Hep C and Cirrhosis  H/o GIB w/ nsaids GERD Questionable H/O pancreatitis (on Creon at home)  P:   Carafate VT q6hr NPO Speech swallow eval  HEMATOLOGIC A:   H/o thrombocytopenia (currently WNL) Anemia - Mild. No signs of active bleeding. Likely due to blood loss with aspiration during thrombectomy.  P:  SCDs Anticoagulation per neurology  Trending cell counts w/ CBC Careful monitoring if antiplatelet therapy initiated given h/o GIB    INFECTIOUS A:   No acute Issues  P:   Monitoring for signs of infection.  ENDOCRINE A:   Hypothyroidism - TSH 14.387 & Free T4 0.59. Hyperglycemia - Mild. No h/o DM.  P:   Synthroid VT daily Monitor glucose on daily chemistry  FAMILY  - Updates:  No family at bedside 9/22. And 06/27/16 and 06/28/16  - Inter-disciplinary family meet or Palliative Care meeting due by:  9/28  TODAY'S SUMMARY:  Extubated yesterday. Maintaining airway. PCCM will sign off.     Dr. Brand Males, M.D., [facility].C.P Pulmonary and Critical Care Medicine Staff Physician [place]  Pulmonary and Critical Care Pager: [phone], If no answer or between  15:00h - 7:00h: call [phone]  06/28/2016 10:18 AM

## 2016-06-28 NOTE — Evaluation (Signed)
Clinical/Bedside Swallow Evaluation Patient Details  Name: Nathan Frank MRN: [id number] Date of Birth: 01/11/1941  Today's Date: 06/28/2016 Time: SLP Start Time (ACUTE ONLY): 1008 SLP Stop Time (ACUTE ONLY): 1020 SLP Time Calculation (min) (ACUTE ONLY): 12 min  Past Medical History:  Past Medical History:  Diagnosis Date  . Abnormal TSH   . Acute gastric ulcer   . Acute gastritis with hemorrhage   . Alcohol abuse   . Alcohol dependence ([place]) 02/02/2014  . Anemia   . CHF (congestive heart failure) ([place])   . Cirrhosis ([place]) 06/04/2012  . Depressive disorder 02/01/2014  . Dizziness and giddiness 12/13/2014  . DVT of lower limb, acute ([place]) 06/06/2012  . Essential hypertension   . Fatty liver   . Gallstones   . Gastritis 06/07/2012  . GERD (gastroesophageal reflux disease)   . GI bleed due to NSAIDs 12/13/2014  . Granulomatous gastritis   . Hematuria 06/04/2012  . Hepatitis C   . Hepatocellular carcinoma ([place])   . Heroin abuse    "I haven't done that since I don't know when."  . Heroin overdose 02/20/2014  . LV dysfunction    a. 04/2015: EF 45-50% by cath.  . Neuropathy ([place])   . NSTEMI (non-ST elevated myocardial infarction) ([place])    a. 04/2015 - patent coronaries. Etiology possibly due to coronary spasm versus embolus, stress cardiomyopathy (atypical), and aborted infarction related to plaque rupture with thrombosis and dissolution. Amlodipine started. Not on antiplatelets due to GIB/cirrhosis history.  . Oral thrush 06/05/2012  . Polysubstance abuse    Rare marijuana.  No EtOH x 2 months.   . Prolonged Q-T interval on ECG    a. 12/2014 - treated with magnesium.  . Right knee pain 12/13/2014  . S/P alcohol detoxification 02/02/2014  . SVT (supraventricular tachycardia) ([place])    a. 12/2014 in setting of GIB, ETOH, NSAIDS, gastritis.  . Symptomatic cholelithiasis 12/15/2013  . Thrombocytopenia ([place])   . Upper GI bleeding 12/13/2014  . Weight loss 06/04/2012   Past Surgical History:  Past  Surgical History:  Procedure Laterality Date  . CARDIAC CATHETERIZATION N/A 04/15/2015   Procedure: Left Heart Cath and Coronary Angiography;  Surgeon: Belva Crome, MD;  Location: [place] [facility];  Service: Cardiovascular;  Laterality: N/A;  . CIRCUMCISION    . ESOPHAGOGASTRODUODENOSCOPY  06/07/2012   Procedure: ESOPHAGOGASTRODUODENOSCOPY (EGD);  Surgeon: Milus Banister, MD;  Location: [place];  Service: Endoscopy;  Laterality: N/A;  may need treatment of varices  . ESOPHAGOGASTRODUODENOSCOPY N/A 12/14/2014   Procedure: ESOPHAGOGASTRODUODENOSCOPY (EGD);  Surgeon: Jerene Bears, MD;  Location[facility] ENDOSCOPY;  Service: Endoscopy;  Laterality: N/A;  . IR GENERIC HISTORICAL  06/25/2016   IR US GUIDE VASC ACCESS LEFT 06/25/2016 Corrie Mckusick, DO [facility]INTER[facility]  . IR GENERIC HISTORICAL  06/25/2016   IR RADIOLOGY PERIPHERAL GUIDED IV START 06/25/2016 Corrie Mckusick, DO [facility]  . RADIOLOGY WITH ANESTHESIA N/A 06/25/2016   Procedure: RADIOLOGY WITH ANESTHESIA;  Surgeon: Luanne Bras, MD;  Location: [place];  Service: Radiology;  Laterality: N/A;  . TONSILLECTOMY     HPI:  75 year old male w/ sig medical h/o: CAD, CHF (EF 40-45%), GIB, Cirrhosis, Hep C and hepatocellular carcinoma, thrombocytopenia and ETOH abuse. Admitted to the ER on 9/21 after being found on floor w/ left sided facial droop, left sided weakness and dysarthria. Dx with right MCA infarct. He was transferred to interventional radiology where he underwent thrombectomy of the R MCA clot. He returned  to the ICU on full vent support. Extubated 9/23.    Assessment / Plan / Recommendation Clinical Impression  Patient presents with significantly impaired swallowing function. Little-no ability to initiate a pharyngeal swallow noted despite max SLP cueing with ice chip trials. Wet, congested cough response noted during attempts, likely a combination of melted ice chips falling into airway and secretion as patient with poor secretion  management at baseline. Recommend NPO. Will f/u at bedside for treatment and readiness to resume a po diet.     Aspiration Risk  Severe aspiration risk    Diet Recommendation NPO;Alternative means - temporary   Medication Administration: Via alternative means    Other  Recommendations Oral Care Recommendations: Oral care QID   Follow up Recommendations  (TBD)      Frequency and Duration min 3x week  2 weeks       Prognosis Prognosis for Safe Diet Advancement: Good Barriers to Reach Goals: Severity of deficits      Swallow Study   General HPI: 75 year old male w/ sig medical h/o: CAD, CHF (EF 40-45%), GIB, Cirrhosis, Hep C and hepatocellular carcinoma, thrombocytopenia and ETOH abuse. Admitted to the ER on 9/21 after being found on floor w/ left sided facial droop, left sided weakness and dysarthria. Dx with right MCA infarct. He was transferred to interventional radiology where he underwent thrombectomy of the R MCA clot. He returned to the ICU on full vent support. Extubated 9/23.  Type of Study: Bedside Swallow Evaluation Previous Swallow Assessment: none Diet Prior to this Study: NPO Temperature Spikes Noted: Yes Respiratory Status: Nasal cannula History of Recent Intubation: Yes Length of Intubations (days): 2 days Date extubated: 06/27/16 Behavior/Cognition: Alert;Cooperative;Pleasant mood Oral Cavity Assessment: Within Functional Limits Oral Care Completed by SLP: Yes Oral Cavity - Dentition: Poor condition;Missing dentition Vision: Functional for self-feeding Self-Feeding Abilities: Able to feed self Patient Positioning: Upright in bed Baseline Vocal Quality: Hoarse;Low vocal intensity (per patient, baseline) Volitional Cough: Weak;Congested;Wet Volitional Swallow: Unable to elicit    Oral/Motor/Sensory Function Overall Oral Motor/Sensory Function: Moderate impairment Facial ROM: Reduced left;Suspected CN VII (facial) dysfunction Facial Symmetry: Abnormal  symmetry left;Suspected CN VII (facial) dysfunction Facial Strength: Reduced left;Suspected CN VII (facial) dysfunction Facial Sensation: Reduced left;Suspected CN V (Trigeminal) dysfunction Lingual ROM: Reduced left;Reduced right;Suspected CN XII (hypoglossal) dysfunction Lingual Symmetry: Within Functional Limits Lingual Strength: Reduced;Suspected CN XII (hypoglossal) dysfunction Lingual Sensation: Within Functional Limits Velum: Within Functional Limits Mandible: Within Functional Limits   Ice Chips Ice chips: Impaired Presentation: Spoon Pharyngeal Phase Impairments: Suspected delayed Swallow;Decreased hyoid-laryngeal movement;Unable to trigger swallow;Cough - Delayed   Thin Liquid Thin Liquid: Not tested    Nectar Thick Nectar Thick Liquid: Not tested   Honey Thick Honey Thick Liquid: Not tested   Puree Puree: Not tested   Solid   GO  Corbet Hanley MA, CCC-SLP [phone]  Solid: Not tested        Zeki Bedrosian Meryl 06/28/2016,10:34 AM

## 2016-06-28 NOTE — Progress Notes (Signed)
STROKE TEAM PROGRESS NOTE   HISTORY OF PRESENT ILLNESS (per record) Nathan Frank is an 75 y.o. male with hx of CAD, CHF (EF 40-45%), GIB, Cirrhosis, Hep C and hepatocellular carcinoma, thrombocytopenia and ETOH abuse presents after falling in the middle of the night and having L sided weakness and L facial droop along with dysarthria. He was last known well 06/24/16 at 11pm. Patient was not administered IV t-PA secondary to delay in arrival. CT perfusion showed salvageable defect. He was taken to IR where he received TICI 3 revascularization of occluded R MCA using solitaire, penumbra and IA integrilin He was admitted to the neuro ICU for further evaluation and treatment.    SUBJECTIVE (INTERVAL HISTORY) Now extubated alert and oriented.    OBJECTIVE Temp:  [98.3 F (36.8 C)-101.9 F (38.8 C)] 98.3 F (36.8 C) (09/24 0745) Pulse Rate:  [82-154] 108 (09/24 0700) Cardiac Rhythm: Sinus tachycardia (09/23 2000) Resp:  [21-39] 35 (09/24 0700) BP: (109-152)/(66-97) 132/76 (09/24 0700) SpO2:  [92 %-100 %] 100 % (09/24 0700) FiO2 (%):  [40 %] 40 % (09/23 1128)  CBC:   Recent Labs Lab 06/27/16 0608 06/28/16 0305  WBC 11.4* 11.3*  NEUTROABS 8.6* 9.8*  HGB 10.4* 10.3*  HCT 31.1* 31.1*  MCV 99.4 99.7  PLT 115* 117*    Basic Metabolic Panel:   Recent Labs Lab 06/27/16 0219 06/27/16 0608 06/28/16 0305  NA 142  --  141  140  K 3.8  --  3.9  3.9  CL 115*  --  118*  117*  CO2 17*  --  18*  17*  GLUCOSE 123*  --  110*  110*  BUN 7  --  8  8  CREATININE 0.79  --  0.82  0.80  CALCIUM 7.5*  --  7.4*  7.3*  MG  --  1.7 1.8  PHOS 2.1*  --  2.6  2.7    Lipid Panel:     Component Value Date/Time   CHOL 87 06/26/2016 0500   TRIG 91 06/27/2016 0219   HDL 26 (L) 06/26/2016 0500   CHOLHDL 3.3 06/26/2016 0500   VLDL 23 06/26/2016 0500   LDLCALC 38 06/26/2016 0500   HgbA1c:  Lab Results  Component Value Date   HGBA1C 4.5 (L) 06/26/2016   Urine Drug Screen:      Component Value Date/Time   LABOPIA POSITIVE (A) 06/09/2016 2059   COCAINSCRNUR NONE DETECTED 06/09/2016 2059   COCAINSCRNUR NEGATIVE 12/13/2014 2353   LABBENZ POSITIVE (A) 06/09/2016 2059   LABBENZ POSITIVE (A) 12/13/2014 2353   AMPHETMU NONE DETECTED 06/09/2016 2059   THCU NONE DETECTED 06/09/2016 2059   LABBARB NONE DETECTED 06/09/2016 2059      IMAGING   MRI brain without contrast 06/26/2016 1. Acute moderately large nonhemorrhagic right MCA infarct. 2. Chronic right frontal left parietal infarcts. 3. Small chronic extra-axial collection or dural thickening in the anterior left frontal region.    CTA Head and Neck 06/28/2016  CT HEAD:  Evolving large RIGHT MCA territory nonhemorrhagic infarct. Old RIGHT frontal and LEFT parietal lobe infarcts. LEFT frontal dural thickening versus chronic subdural hematoma.  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 Small bilateral pleural effusions. Sub cm probable bone islands T4 and T5 though, if there is a history of cancer, recommend bone scan. ( History of hepato-cellular carcinoma)  CTA HEAD:  Occluded RIGHT M3 segment. Mild stenosis distal RIGHT M1 segment. Mild luminal  irregularity of the RIGHT M2 segment may be due to extrinsic deformity, atherosclerosis or vasospasm. No large vessel occlusion.      Ct Head Code Stroke Wo Contrast 06/25/2016 1. Evolving moderate-sized acute right MCA infarct. No evidence of acute intracranial hemorrhage.  2. Chronic ischemic changes as above. 3. Small volume extra-axial material over the anterior left frontal lobe, present since 2014. This may reflect dural thickening nor a chronic, complex subdural collection. No mass effect.    Ir US Guide Vasc Access Left / Ir Radiology Peripheral Guided Iv Start 06/25/2016 Status post ultrasound-guided venipuncture of the left basilic vein.    Ct Cerebral Perfusion W  Contrast 06/25/2016 Right MCA infarct.  Core infarct 55 mL.  Penumbra 152 mL    Cerebral Angio 06/25/2016 S/P Rt common carotid arteriogram,followed by complete revascularization of occluded RT MCA prox with x 3 passes with solitaire 4 mm x 40 mm FR retrieval device ,and ACE penumbra aspiration device, and 7.2 mg of IA integrelin superselectively achieving a TICI 3 reperfusion   Ct Head Wo Contrast post IR 06/25/2016 Dense right MCA with generalized decreased attenuation involving the right parietal and temporal lobes consistent with acute ischemia. Areas of subacute to chronic ischemia as described. No acute hemorrhage is noted.   Dg Chest Port 1 View 06/25/2016 1. Endotracheal tube seen ending 4-5 cm above the carina.  2. Mild right midlung and left basilar atelectasis noted.     PHYSICAL EXAM  Exam: Neuro: Alert and oriented 2, now extubated Pupils equal and reactive to light and accommodation, extraocular movement intact, left facial droop tongue is midline shoulder shrug intact. Motor exam left upper extremity is 3 out of 5, left lower extremities 4+ out of 5  Right side seems to be within normal limits. Speech is intact     ASSESSMENT/PLAN Nathan Frank is a 75 y.o. male with history of CAD, CHF (EF 40-45%), GIB, Cirrhosis, Hep C and hepatocellular carcinoma, thrombocytopenia and ETOH abuse presenting with L hemiparesis, L facial and dysarthria. He did not receive IV t-PA due to delay in arrival. He was taken to IR where he received TICI 3 revascularization of occluded R MCA using solitaire, penumbra and IA integrilin.   Stroke:  Non-dominant large right MCA infarct s/p TICI 3 revascularization of occluded R MCA with mechanical thrombectomy and IA Integrilin, infarct embolic secondary to unknown source    MRI - Acute moderately large nonhemorrhagic right MCA infarct.   CTA neck showedNon flow limiting mid RIGHT cervical internal carotid artery dissection with small  pseudo aneurysm   2D Echo - EF 40-45%.   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 Pancreatitis , on Creon  Hx thrombocytopenia -> 117  Hypothyroidism on synthroid  UDS - benzodiazepines and opiates   PLAN  Consider oncology consultation due to findings of CT scan Sub cm probable bone islands T4 and T5 though, ( History of hepato-cellular carcinoma)  Speech swallow eval, once cleared we will such his aspirin to oral, will add statin therapy, we will consider adding metformin           To contact Stroke Continuity provider, please refer to [email or web address] After hours, contact General Neurology

## 2016-06-29 ENCOUNTER — Inpatient Hospital Stay (HOSPITAL_COMMUNITY): Payer: Medicare Other

## 2016-06-29 DIAGNOSIS — I480 Paroxysmal atrial fibrillation: Secondary | ICD-10-CM

## 2016-06-29 DIAGNOSIS — R0682 Tachypnea, not elsewhere classified: Secondary | ICD-10-CM

## 2016-06-29 DIAGNOSIS — I69391 Dysphagia following cerebral infarction: Secondary | ICD-10-CM

## 2016-06-29 DIAGNOSIS — F1910 Other psychoactive substance abuse, uncomplicated: Secondary | ICD-10-CM

## 2016-06-29 DIAGNOSIS — K922 Gastrointestinal hemorrhage, unspecified: Secondary | ICD-10-CM | POA: Diagnosis not present

## 2016-06-29 DIAGNOSIS — I5043 Acute on chronic combined systolic (congestive) and diastolic (congestive) heart failure: Secondary | ICD-10-CM

## 2016-06-29 DIAGNOSIS — D696 Thrombocytopenia, unspecified: Secondary | ICD-10-CM

## 2016-06-29 DIAGNOSIS — R509 Fever, unspecified: Secondary | ICD-10-CM

## 2016-06-29 DIAGNOSIS — I340 Nonrheumatic mitral (valve) insufficiency: Secondary | ICD-10-CM

## 2016-06-29 DIAGNOSIS — D638 Anemia in other chronic diseases classified elsewhere: Secondary | ICD-10-CM | POA: Diagnosis not present

## 2016-06-29 DIAGNOSIS — I63411 Cerebral infarction due to embolism of right middle cerebral artery: Principal | ICD-10-CM

## 2016-06-29 DIAGNOSIS — Z86718 Personal history of other venous thrombosis and embolism: Secondary | ICD-10-CM

## 2016-06-29 DIAGNOSIS — C220 Liver cell carcinoma: Secondary | ICD-10-CM

## 2016-06-29 DIAGNOSIS — D62 Acute posthemorrhagic anemia: Secondary | ICD-10-CM

## 2016-06-29 DIAGNOSIS — I5042 Chronic combined systolic (congestive) and diastolic (congestive) heart failure: Secondary | ICD-10-CM | POA: Diagnosis present

## 2016-06-29 DIAGNOSIS — D72829 Elevated white blood cell count, unspecified: Secondary | ICD-10-CM

## 2016-06-29 DIAGNOSIS — I69322 Dysarthria following cerebral infarction: Secondary | ICD-10-CM

## 2016-06-29 DIAGNOSIS — K7031 Alcoholic cirrhosis of liver with ascites: Secondary | ICD-10-CM

## 2016-06-29 LAB — CBC WITH DIFFERENTIAL/PLATELET
BASOS PCT: 0 %
Basophils Absolute: 0 10*3/uL (ref 0.0–0.1)
EOS PCT: 1 %
Eosinophils Absolute: 0.1 10*3/uL (ref 0.0–0.7)
HEMATOCRIT: 29.7 % — AB (ref 39.0–52.0)
HEMOGLOBIN: 10.1 g/dL — AB (ref 13.0–17.0)
LYMPHS PCT: 11 %
Lymphs Abs: 0.9 10*3/uL (ref 0.7–4.0)
MCH: 33.1 pg (ref 26.0–34.0)
MCHC: 34 g/dL (ref 30.0–36.0)
MCV: 97.4 fL (ref 78.0–100.0)
MONOS PCT: 13 %
Monocytes Absolute: 1.1 10*3/uL — ABNORMAL HIGH (ref 0.1–1.0)
NEUTROS PCT: 75 %
Neutro Abs: 6.5 10*3/uL (ref 1.7–7.7)
Platelets: ADEQUATE 10*3/uL (ref 150–400)
RBC: 3.05 MIL/uL — AB (ref 4.22–5.81)
RDW: 16.1 % — ABNORMAL HIGH (ref 11.5–15.5)
WBC: 8.6 10*3/uL (ref 4.0–10.5)

## 2016-06-29 LAB — RENAL FUNCTION PANEL
ANION GAP: 8 (ref 5–15)
Albumin: 1.8 g/dL — ABNORMAL LOW (ref 3.5–5.0)
BUN: 11 mg/dL (ref 6–20)
CHLORIDE: 118 mmol/L — AB (ref 101–111)
CO2: 14 mmol/L — AB (ref 22–32)
CREATININE: 0.87 mg/dL (ref 0.61–1.24)
Calcium: 7.8 mg/dL — ABNORMAL LOW (ref 8.9–10.3)
GFR calc non Af Amer: >60 mL/min (ref >60)
Glucose, Bld: 104 mg/dL — ABNORMAL HIGH (ref 65–99)
POTASSIUM: 3.9 mmol/L (ref 3.5–5.1)
Phosphorus: 2.6 mg/dL (ref 2.5–4.6)
Sodium: 140 mmol/L (ref 135–145)

## 2016-06-29 LAB — GLUCOSE, CAPILLARY
Glucose-Capillary: 123 mg/dL — ABNORMAL HIGH (ref 65–99)
Glucose-Capillary: 114 mg/dL — ABNORMAL HIGH (ref 65–99)

## 2016-06-29 LAB — MAGNESIUM: MAGNESIUM: 2 mg/dL (ref 1.7–2.4)

## 2016-06-29 MED ORDER — ENOXAPARIN SODIUM 40 MG/0.4ML ~~LOC~~ SOLN
40.0000 mg | SUBCUTANEOUS | Status: DC
  Administered 2016-06-30 – 2016-07-03 (×4): 40 mg via SUBCUTANEOUS
  Filled 2016-06-29 (×4): qty 0.4

## 2016-06-29 MED ORDER — ADULT MULTIVITAMIN W/MINERALS CH
1.0000 | ORAL_TABLET | Freq: Every day | ORAL | Status: DC
  Administered 2016-06-30 – 2016-07-04 (×5): 1 via ORAL
  Filled 2016-06-29 (×5): qty 1

## 2016-06-29 MED ORDER — METOPROLOL TARTRATE 25 MG PO TABS
25.0000 mg | ORAL_TABLET | Freq: Two times a day (BID) | ORAL | Status: DC
  Administered 2016-06-29 – 2016-07-02 (×6): 25 mg via ORAL
  Filled 2016-06-29 (×6): qty 1

## 2016-06-29 MED ORDER — VITAL AF 1.2 CAL PO LIQD
1000.0000 mL | ORAL | Status: DC
  Administered 2016-06-29 – 2016-07-02 (×5): 1000 mL
  Filled 2016-06-29 (×7): qty 1000

## 2016-06-29 MED ORDER — ASPIRIN EC 325 MG PO TBEC: 325.0000 mg | DELAYED_RELEASE_TABLET | Freq: Every day | ORAL | Status: DC

## 2016-06-29 NOTE — Consult Note (Signed)
Physical Medicine and Rehabilitation Consult   Reason for Consult: Left sided weakness with sensory deficits, left facial droop with dysarthria,  Referring Physician: Dr. Erlinda Hong.    HPI: Nathan Frank is a 75 y.o. RH-male with history of Afib, alcoholic cirrhosis with ascites/thrombocytopenia, GIB, polysubstance abuse, Hep C, hepatocellular CA, recent admission for acute on chronic CHF. He was admitted on 06/25/16 after being found on the floor with left sided weakness, right gaze preference and facial droop with dysarthria.  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   He underwent cerebral angio with thrombectomy of R-MCA clot and IA Integrilin. 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 2D echo with EF 40-45% and mild MVR.    He tolerated extubation on 09/23 but noted to have weak cough with difficulty handling secretions. He developed fever and was started on IV Vanc/Zosyn.   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 Infarct felt to be embolic due to unknown source and patient placed on ASA for secondary stroke prevention. Swallow evaluation showed significant dysphagia with little to no ability to initiate swallow--NPO recommended. PT evaluation done yesterday revealing difficulty with processing, need for verbal and tactile cues for sequencing, problems with memory and left sided weakness affecting mobility. CIR recommended  Review of Systems  HENT: Negative for hearing loss.   Eyes: Negative for blurred vision and double vision.  Respiratory: Positive for cough, sputum production and wheezing. Negative for shortness of breath.   Cardiovascular: Negative for chest pain, palpitations and leg swelling.    Gastrointestinal: Positive for heartburn. Negative for abdominal pain and nausea.  Musculoskeletal: Negative for back pain, myalgias and neck pain.  Skin: Negative for itching and rash.  Neurological: Positive for speech change and focal weakness. Negative for dizziness and headaches.  Psychiatric/Behavioral: The patient is not nervous/anxious.   All other systems reviewed and are negative.     Past Medical History:  Diagnosis Date  . Abnormal TSH   . Acute gastric ulcer   . Acute gastritis with hemorrhage   . Alcohol abuse   . Alcohol dependence ([place]) 02/02/2014  . Anemia   . CHF (congestive heart failure) ([place])   . Cirrhosis ([place]) 06/04/2012  . Depressive disorder 02/01/2014  . Dizziness and giddiness 12/13/2014  . DVT of lower limb, acute ([place]) 06/06/2012  . Essential hypertension   . Fatty liver   . Gallstones   . Gastritis 06/07/2012  . GERD (gastroesophageal reflux disease)   . GI bleed due to NSAIDs 12/13/2014  . Granulomatous gastritis   . Hematuria 06/04/2012  . Hepatitis C   . Hepatocellular carcinoma ([place])   . Heroin abuse    "I haven't done that since I don't know when."  . Heroin overdose 02/20/2014  . LV dysfunction    a. 04/2015: EF 45-50% by cath.  . Neuropathy ([place])   . NSTEMI (non-ST elevated myocardial infarction) ([place])    a. 04/2015 - patent coronaries. Etiology possibly due to coronary spasm versus embolus, stress cardiomyopathy (atypical), and aborted infarction related to plaque rupture with thrombosis and dissolution. Amlodipine started. Not on antiplatelets due to GIB/cirrhosis history.  . Oral thrush 06/05/2012  . Polysubstance abuse    Rare marijuana.  No EtOH x 2 months.   . Prolonged Q-T  interval on ECG    a. 12/2014 - treated with magnesium.  . Right knee pain 12/13/2014  . S/P alcohol detoxification 02/02/2014  . SVT (supraventricular tachycardia) ([place])    a. 12/2014 in setting of GIB, ETOH, NSAIDS, gastritis.  . Symptomatic cholelithiasis 12/15/2013  .  Thrombocytopenia ([place])   . Upper GI bleeding 12/13/2014  . Weight loss 06/04/2012    Past Surgical History:  Procedure Laterality Date  . CARDIAC CATHETERIZATION N/A 04/15/2015   Procedure: Left Heart Cath and Coronary Angiography;  Surgeon: Belva Crome, MD;  Location: [facility];  Service: Cardiovascular;  Laterality: N/A;  . CIRCUMCISION    . ESOPHAGOGASTRODUODENOSCOPY  06/07/2012   Procedure: ESOPHAGOGASTRODUODENOSCOPY (EGD);  Surgeon: Milus Banister, MD;  Location: [facility];  Service: Endoscopy;  Laterality: N/A;  may need treatment of varices  . ESOPHAGOGASTRODUODENOSCOPY N/A 12/14/2014   Procedure: ESOPHAGOGASTRODUODENOSCOPY (EGD);  Surgeon: Jerene Bears, MD;  Location: [facility] ENDOSCOPY;  Service: Endoscopy;  Laterality: N/A;  . IR GENERIC HISTORICAL  06/25/2016   IR US GUIDE VASC ACCESS LEFT 06/25/2016 Corrie Mckusick, DO [facility]  . IR GENERIC HISTORICAL  06/25/2016   IR RADIOLOGY PERIPHERAL GUIDED IV START 06/25/2016 Corrie Mckusick, DO [facility]  . RADIOLOGY WITH ANESTHESIA N/A 06/25/2016   Procedure: RADIOLOGY WITH ANESTHESIA;  Surgeon: Luanne Bras, MD;  Location: [facility];  Service: Radiology;  Laterality: N/A;  . TONSILLECTOMY      Family History  Problem Relation Age of Onset  . Stroke Father   . Prostate cancer Brother   . Heart disease Mother     Pacemaker    Social History:  Married--reports wife in good health. He retired 2 years ago?  Independent with cane?walker? PTA--sedentary. Per  reports that he has quit smoking 50 years ago. His smoking use included Cigarettes. He has a 2.50 pack-year smoking history. He has never used smokeless tobacco. He reports that he has not had a drink in 2 months? history of drinking 16.8 oz of alcohol per week . History of heroin OD "years ago". He reports that he does not use drugs.    Allergies: No Known Allergies    Medications Prior to Admission  Medication Sig Dispense Refill  . aspirin EC 325 MG tablet Take 1 tablet  (325 mg total) by mouth daily. 30 tablet 0  . Cholecalciferol (VITAMIN D PO) Take 1 tablet by mouth daily.    . folic acid (FOLVITE) 1 MG tablet Take 1 tablet (1 mg total) by mouth daily. 30 tablet 0  . furosemide (LASIX) 40 MG tablet Take 1 tablet (40 mg total) by mouth daily. 30 tablet 0  . levothyroxine (SYNTHROID, LEVOTHROID) 50 MCG tablet Take 1 tablet (50 mcg total) by mouth daily before breakfast. 30 tablet 0  . magnesium oxide (MAG-OX) 400 (241.3 Mg) MG tablet Take 1 tablet (400 mg total) by mouth daily. 30 tablet 0  . metoprolol tartrate (LOPRESSOR) 25 MG tablet Take 1 tablet (25 mg total) by mouth 3 (three) times daily. 90 tablet 0  . Multiple Vitamin (MULTIVITAMIN WITH MINERALS) TABS tablet Take 1 tablet by mouth daily. 30 tablet 0  . potassium chloride (K-DUR) 10 MEQ tablet Take 2 tablets (20 mEq total) by mouth daily. 60 tablet 0  . spironolactone (ALDACTONE) 25 MG tablet Take 0.5 tablets (12.5 mg total) by mouth daily. 30 tablet 0  . sucralfate (CARAFATE) 1 g tablet Take 1 tablet (1 g total) by mouth 2 (two) times daily. 60 tablet  0  . zolpidem (AMBIEN) 5 MG tablet Take 1 tablet (5 mg total) by mouth at bedtime as needed for sleep. 30 tablet 0  . lipase/protease/amylase (CREON) 12000 units CPEP capsule Take 1 capsule (12,000 Units total) by mouth 3 (three) times daily with meals. (Patient not taking: Reported on 06/25/2016) 270 capsule 0  . saccharomyces boulardii (FLORASTOR) 250 MG capsule Take 1 capsule (250 mg total) by mouth 2 (two) times daily. (Patient not taking: Reported on 06/25/2016) 60 capsule 0  . thiamine 100 MG tablet Take 1 tablet (100 mg total) by mouth daily. (Patient not taking: Reported on 05/27/2016) 30 tablet 0    Home: [place] expects to be discharged to:: Private residence Living Arrangements: Spouse/significant other Available Help at Discharge: Family, Available 24 hours/day Type of Home: House Home Access: Stairs to enter [employer] of Steps: 4 Entrance Stairs-Rails: Right, Left, Can reach both Home Layout: One level Bathroom Shower/Tub: Multimedia programmer: Handicapped height Home Equipment: Radio producer - single point, Grab bars - tub/shower, Hand held shower head, Environmental consultant - 2 wheels  Functional History: Prior Function Level of Independence: Independent with assistive device(s) Comments: Used SPC for mobility but recently has been using RW  Functional Status:  Mobility: Bed Mobility Overal bed mobility: Needs Assistance Bed Mobility: Rolling, Sidelying to Sit, Sit to Sidelying Rolling: Min assist Sidelying to sit: Mod assist, HOB elevated Sit to sidelying: Min assist General bed mobility comments: + rail; rolled bil for cleaning (incontinent of BM on arrival); requires step by step cues Transfers Overall transfer level: Needs assistance Equipment used: None Transfers: Sit to/from Stand Sit to Stand: Mod assist, From elevated surface (ICU bed) General transfer comment: Stood without knee buckling, however flexed at hips and through torso; unsafe to pivot with +1 assist      ADL:    Cognition: Cognition Overall Cognitive Status: Impaired/Different from baseline Orientation Level: (P) Oriented X4 Cognition Arousal/Alertness: Awake/alert Behavior During Therapy: WFL for tasks assessed/performed Overall Cognitive Status: Impaired/Different from baseline Area of Impairment: Orientation, Problem solving, Following commands Orientation Level: Time (+month, +day but year 26; +[facility] hospital; +stroke) Following Commands: Follows one step commands with increased time Problem Solving: Slow processing, Difficulty sequencing, Requires verbal cues, Requires tactile cues General Comments: difficulty moving himself with bed mobility and required frequent cues for sequencing   Blood pressure 120/79, pulse 80, temperature 98.7 F (37.1 C), temperature source Axillary, resp. rate (!) 24, height 5\' 10"   (1.778 m), weight 96.8 kg (213 lb 6.5 oz), SpO2 100 %. Physical Exam  Nursing note and vitals reviewed. Constitutional: He is oriented to person, place, and time. He appears well-developed and well-nourished. No distress. Nasal cannula in place.  HENT:  Head: Normocephalic and atraumatic.  Mouth/Throat: Oropharynx is clear and moist.  Eyes: Conjunctivae and EOM are normal. Pupils are equal, round, and reactive to light. Right eye exhibits no discharge. Left eye exhibits no discharge.  Neck: Normal range of motion. Neck supple.  Cardiovascular: Normal rate and regular rhythm.   Respiratory: Effort normal. No accessory muscle usage or stridor. No respiratory distress. He has rales. He exhibits no tenderness.  Wet congested voice with weak cough and inability to clear secretions.  Suctioned without gag reflex.  +[place]  GI: Soft. Bowel sounds are normal.  Musculoskeletal: He exhibits edema (1+ edema left forearm. ). He exhibits no tenderness.  Neurological: He is alert and oriented to person, place, and time. A cranial nerve deficit is present.  Left inattention.  Left facial weakness with dysarthria.  Slow to initiated with occasional perseverative behaviors. Able to follow one and occasional two step commands. Lacks insight/awareness of deficits.  DTRs symmetric Motor: RUE: 4+/5 proximal to distal RLE: 4+/5 proximal to distal LUE: 2-/5 proximal to distal LLE: 3+/5 hip flexion, knee extension, 4+/5 ankle dorsi/plantar flexion  Skin: Skin is warm and dry. He is not diaphoretic.  Psychiatric: He has a normal mood and affect. His behavior is normal.    Results for orders placed or performed during the hospital encounter of 06/25/16 (from the past 24 hour(s))  Culture, Urine     Status: None (Preliminary result)   Collection Time: 06/28/16  9:06 PM  Result Value Ref Range   Specimen Description URINE, CATHETERIZED    Special Requests zosyn, vancomycin    Culture PENDING    Report Status  PENDING   Magnesium     Status: None   Collection Time: 06/29/16  3:32 AM  Result Value Ref Range   Magnesium 2.0 1.7 - 2.4 mg/dL  Renal function panel     Status: Abnormal   Collection Time: 06/29/16  3:32 AM  Result Value Ref Range   Sodium 140 135 - 145 mmol/L   Potassium 3.9 3.5 - 5.1 mmol/L   Chloride 118 (H) 101 - 111 mmol/L   CO2 14 (L) 22 - 32 mmol/L   Glucose, Bld 104 (H) 65 - 99 mg/dL   BUN 11 6 - 20 mg/dL   Creatinine, Ser 0.87 0.61 - 1.24 mg/dL   Calcium 7.8 (L) 8.9 - 10.3 mg/dL   Phosphorus 2.6 2.5 - 4.6 mg/dL   Albumin 1.8 (L) 3.5 - 5.0 g/dL   GFR calc non Af Amer >60 >60 mL/min   GFR calc Af Amer >60 >60 mL/min   Anion gap 8 5 - 15  CBC with Differential/Platelet     Status: Abnormal   Collection Time: 06/29/16  6:52 AM  Result Value Ref Range   WBC 8.6 4.0 - 10.5 K/uL   RBC 3.05 (L) 4.22 - 5.81 MIL/uL   Hemoglobin 10.1 (L) 13.0 - 17.0 g/dL   HCT 29.7 (L) 39.0 - 52.0 %   MCV 97.4 78.0 - 100.0 fL   MCH 33.1 26.0 - 34.0 pg   MCHC 34.0 30.0 - 36.0 g/dL   RDW 16.1 (H) 11.5 - 15.5 %   Platelets  150 - 400 K/uL    PLATELET CLUMPS NOTED ON SMEAR, COUNT APPEARS ADEQUATE   Neutrophils Relative % 75 %   Lymphocytes Relative 11 %   Monocytes Relative 13 %   Eosinophils Relative 1 %   Basophils Relative 0 %   Neutro Abs 6.5 1.7 - 7.7 K/uL   Lymphs Abs 0.9 0.7 - 4.0 K/uL   Monocytes Absolute 1.1 (H) 0.1 - 1.0 K/uL   Eosinophils Absolute 0.1 0.0 - 0.7 K/uL   Basophils Absolute 0.0 0.0 - 0.1 K/uL   Ct Angio Head W Or Wo Contrast  Result Date: 06/28/2016 CLINICAL DATA:  Stroke, followup evaluation. Status post revascularization of occluded RIGHT middle cerebral artery. EXAM: CT ANGIOGRAPHY HEAD AND NECK TECHNIQUE: Multidetector CT imaging of the head and neck was performed using the standard protocol during bolus administration of intravenous contrast. Multiplanar CT image reconstructions and MIPs were obtained to evaluate the vascular anatomy. Carotid stenosis  measurements (when applicable) are obtained utilizing NASCET criteria, using the distal internal carotid diameter as the denominator. CONTRAST:  50 cc Isovue 370 COMPARISON:  MRI head  June 26, 2016 and FINDINGS: CT HEAD BRAIN: Blurring of the RIGHT frontotemporal parietal gray-white matter differentiation, with patchy RIGHT basal ganglia and corona radiata hypodensity corresponding to known acute infarct. Local sulcal effacement without midline shift. No intraparenchymal hemorrhage. RIGHT frontal and LEFT parietal lobe encephalomalacia. Mild ventriculomegaly, with partially effaced RIGHT temporal horn, no hydrocephalus. No significant extra-axial fluid collections. Minimal LEFT frontal dural thickening or chronic subdural hematoma. Minimally effaced RIGHT basal cisterns. VASCULAR: Mild calcific atherosclerosis of the carotid siphons. SKULL: No skull fracture. Old bilateral distal nasal bone fractures. No significant scalp soft tissue swelling. SINUSES/ORBITS: The mastoid air-cells and included paranasal sinuses are well-aerated.The included ocular globes and orbital contents are non-suspicious. OTHER: None. CTA NECK AORTIC ARCH: Normal appearance of the thoracic arch, normal branch pattern. Mild calcific atherosclerosis. The origins of the innominate, left Common carotid artery and subclavian artery are widely patent. RIGHT CAROTID SYSTEM: Common carotid artery is widely patent, coursing in a straight line fashion. Mild eccentric calcific atherosclerosis of RIGHT internal carotid artery origin. Normal appearance of the carotid bifurcation without hemodynamically significant stenosis by NASCET criteria. 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 No intramural hematoma. LEFT CAROTID SYSTEM: Common carotid artery is widely patent, coursing in a straight line fashion. Normal appearance of the carotid bifurcation without hemodynamically  significant stenosis by NASCET criteria. Normal appearance of the included internal carotid artery. VERTEBRAL ARTERIES:Mild stenosis RIGHT vertebral artery origin. Codominant vertebral arteries. Vertebral arteries are patent throughout the course. Mild extrinsic deformity due to degenerative cervical spine. SKELETON: No acute osseous process though bone windows have not been submitted. Poor dentition. Subcentimeter sclerotic foci T4 and T5 vertebral bodies . OTHER NECK: Soft tissues of the neck are non-acute though, not tailored for evaluation. Lobulated RIGHT pleural effusion extending into the fissure. Small layering LEFT pleural effusion. CTA HEAD ANTERIOR CIRCULATION: Normal appearance of the cervical internal carotid arteries, petrous, cavernous and supra clinoid internal carotid arteries. Widely patent anterior communicating artery. RIGHT A1 segment is dominant. Patent bilateral anterior cerebral arteries. Bilateral middle cerebral arteries are patent. Mild stenosis distal RIGHT M1. Mild luminal irregularity RIGHT M2 segment. Occluded distal RIGHT M3 segment. No large vessel occlusion, hemodynamically significant stenosis, dissection, contrast extravasation or aneurysm. POSTERIOR CIRCULATION: Normal appearance of the vertebral arteries, vertebrobasilar junction and basilar artery, as well as main branch vessels. Normal appearance of the posterior cerebral arteries. Bilateral posterior communicating arteries present. No large vessel occlusion, hemodynamically significant stenosis, dissection, luminal irregularity, contrast extravasation or aneurysm. VENOUS SINUSES: Major dural venous sinuses are patent though not tailored for evaluation on this angiographic examination. ANATOMIC VARIANTS: None. DELAYED PHASE: Mild RIGHT final cortical enhancement. Asymmetrically dense RIGHT middle cerebral artery segments on delayed phase. 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 Old RIGHT  frontal and LEFT parietal lobe infarcts. LEFT frontal dural thickening versus chronic subdural hematoma. 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 Small bilateral pleural effusions. Sub cm probable bone islands T4 and T5 though, if there is a history of cancer, recommend bone scan. CTA HEAD: Occluded RIGHT M3 segment. Mild stenosis distal RIGHT M1 segment. Mild luminal irregularity of the RIGHT M2 segment may be due to extrinsic deformity, atherosclerosis or vasospasm. No large vessel occlusion. Electronically Signed   By: Elon Alas M.D.   On: 06/28/2016 07:02   Ct Angio Neck W Or Wo Contrast  Result Date: 06/28/2016 CLINICAL DATA:  Stroke, followup evaluation. Status post revascularization of occluded RIGHT middle cerebral artery. EXAM: CT ANGIOGRAPHY HEAD AND NECK TECHNIQUE: Multidetector CT imaging of the head and neck was performed using the standard protocol during bolus administration of intravenous contrast. Multiplanar CT image reconstructions and MIPs were obtained to evaluate the vascular anatomy. Carotid stenosis measurements (when applicable) are obtained utilizing NASCET criteria, using the distal internal carotid diameter as the denominator. CONTRAST:  50 cc Isovue 370 COMPARISON:  MRI head June 26, 2016 and FINDINGS: CT HEAD BRAIN: Blurring of the RIGHT frontotemporal parietal gray-white matter differentiation, with patchy RIGHT basal ganglia and corona radiata hypodensity corresponding to known acute infarct. Local sulcal effacement without midline shift. No intraparenchymal hemorrhage. RIGHT frontal and LEFT parietal lobe encephalomalacia. Mild ventriculomegaly, with partially effaced RIGHT temporal horn, no hydrocephalus. No significant extra-axial fluid collections. Minimal LEFT frontal dural thickening or chronic subdural hematoma. Minimally effaced RIGHT basal cisterns. VASCULAR: Mild calcific atherosclerosis of  the carotid siphons. SKULL: No skull fracture. Old bilateral distal nasal bone fractures. No significant scalp soft tissue swelling. SINUSES/ORBITS: The mastoid air-cells and included paranasal sinuses are well-aerated.The included ocular globes and orbital contents are non-suspicious. OTHER: None. CTA NECK AORTIC ARCH: Normal appearance of the thoracic arch, normal branch pattern. Mild calcific atherosclerosis. The origins of the innominate, left Common carotid artery and subclavian artery are widely patent. RIGHT CAROTID SYSTEM: Common carotid artery is widely patent, coursing in a straight line fashion. Mild eccentric calcific atherosclerosis of RIGHT internal carotid artery origin. Normal appearance of the carotid bifurcation without hemodynamically significant stenosis by NASCET criteria. 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 No intramural hematoma. LEFT CAROTID SYSTEM: Common carotid artery is widely patent, coursing in a straight line fashion. Normal appearance of the carotid bifurcation without hemodynamically significant stenosis by NASCET criteria. Normal appearance of the included internal carotid artery. VERTEBRAL ARTERIES:Mild stenosis RIGHT vertebral artery origin. Codominant vertebral arteries. Vertebral arteries are patent throughout the course. Mild extrinsic deformity due to degenerative cervical spine. SKELETON: No acute osseous process though bone windows have not been submitted. Poor dentition. Subcentimeter sclerotic foci T4 and T5 vertebral bodies . OTHER NECK: Soft tissues of the neck are non-acute though, not tailored for evaluation. Lobulated RIGHT pleural effusion extending into the fissure. Small layering LEFT pleural effusion. CTA HEAD ANTERIOR CIRCULATION: Normal appearance of the cervical internal carotid arteries, petrous, cavernous and supra clinoid internal carotid arteries. Widely patent anterior  communicating artery. RIGHT A1 segment is dominant. Patent bilateral anterior cerebral arteries. Bilateral middle cerebral arteries are patent. Mild stenosis distal RIGHT M1. Mild luminal irregularity RIGHT M2 segment. Occluded distal RIGHT M3 segment. No large vessel occlusion, hemodynamically significant stenosis, dissection, contrast extravasation or aneurysm. POSTERIOR CIRCULATION: Normal appearance of the vertebral arteries, vertebrobasilar junction and basilar artery, as well as main branch vessels. Normal appearance of the posterior cerebral arteries. Bilateral posterior communicating arteries present. No large vessel occlusion, hemodynamically significant stenosis, dissection, luminal irregularity, contrast extravasation or aneurysm. VENOUS SINUSES: Major dural venous sinuses are patent though not tailored for evaluation on this angiographic examination. ANATOMIC VARIANTS: None. DELAYED PHASE: Mild RIGHT final cortical enhancement. Asymmetrically dense RIGHT middle cerebral artery segments on delayed phase. 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 LEFT frontal dural thickening versus chronic subdural hematoma. 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 Small bilateral pleural effusions.  Sub cm probable bone islands T4 and T5 though, if there is a history of cancer, recommend bone scan. CTA HEAD: Occluded RIGHT M3 segment. Mild stenosis distal RIGHT M1 segment. Mild luminal irregularity of the RIGHT M2 segment may be due to extrinsic deformity, atherosclerosis or vasospasm. No large vessel occlusion. Electronically Signed   By: Elon Alas M.D.   On: 06/28/2016 07:02    Assessment/Plan: Diagnosis: Right MCA infarct Labs and images independently reviewed.  Records reviewed and summated above. Stroke: Continue secondary stroke prophylaxis and  Risk Factor Modification listed below:   Antiplatelet therapy:   Blood Pressure Management:  Continue current medication with prn's with permisive HTN per primary team Statin Agent:   Left sided hemiparesis: fit for orthosis to prevent contractures (resting hand splint for day, wrist cock up splint at night, etc) Motor recovery: Fluoxetine  1. Does the need for close, 24 hr/day medical supervision in concert with the patient's rehab needs make it unreasonable for this patient to be served in a less intensive setting? Yes  2. Co-Morbidities requiring supervision/potential complications: NPO (advance diet as tolerated), fever (cont to monitor), Afib (monitor HR with increased activity), alcoholic cirrhosis (monitor labs, avoid hepatotoxic meds), ascites/thrombocytopenia (<60,000/mm3 no resistive exercise), GIB (follow Hb), polysubstance abuse (counsel when appropriate), Hep C (avoid hepatotoxic meds), hepatocellular CA (see previous), chronic combined CHF with MVR (Monitor in accordance with increased physical activity and avoid UE resistance excercises), tachypnea (monitor RR and O2 Sats with increased physical exertion), ABLA (transfuse if necessary to ensure appropriate perfusion for increased activity tolerance), leukocytosis (cont to monitor for signs and symptoms of infection, further workup if indicated) 3. Due to safety, skin/wound care, disease management, medication administration and patient education, does the patient require 24 hr/day rehab nursing? Yes 4. Does the patient require coordinated care of a physician, rehab nurse, PT (1-2 hrs/day, 5 days/week), OT (1-2 hrs/day, 5 days/week) and SLP (1-2 hrs/day, 5 days/week) to address physical and functional deficits in the context of the above medical diagnosis(es)? Yes Addressing deficits in the following areas: balance, endurance, locomotion, strength, transferring, bathing, dressing, toileting, swallowing and psychosocial support 5. Can the  patient actively participate in an intensive therapy program of at least 3 hrs of therapy per day at least 5 days per week? Yes 6. The potential for patient to make measurable gains while on inpatient rehab is excellent 7. Anticipated functional outcomes upon discharge from inpatient rehab are min assist  with PT, supervision and min assist with OT, modified independent and supervision with SLP. 8. Estimated rehab length of stay to reach the above functional goals is: 20-24 days. 9. Does the patient have adequate social supports and living environment to accommodate these discharge functional goals? Potentially 10. Anticipated D/C setting: Home 11. Anticipated post D/C treatments: HH therapy and Home excercise program 12. Overall Rehab/Functional Prognosis: good  RECOMMENDATIONS: This patient's condition is appropriate for continued rehabilitative care in the following setting: Likely CIR, will request more complete eval by therapies and determine caregiver support at discharge.  Will also need means of nutrition. Patient has agreed to participate in recommended program. Yes Note that insurance prior authorization may be required for reimbursement for recommended care.  Comment: Rehab Admissions Coordinator to follow up.  Delice Lesch, MD, Mellody Drown 06/29/2016

## 2016-06-29 NOTE — Progress Notes (Signed)
Paged Dr. Leonel Ramsay regarding HR in 140's-150's for approx 6 min. Orders given for 25 mg tab lopressor BID. MD gave ok to transfer to [facility].

## 2016-06-29 NOTE — Progress Notes (Signed)
Speech Language Pathology Treatment: Dysphagia  Patient Details Name: Nathan Frank MRN: [id number] DOB: Feb 27, 1941 Today's Date: 06/29/2016 Time: 0950-1010 SLP Time Calculation (min) (ACUTE ONLY): 20 min  Assessment / Plan / Recommendation Clinical Impression  Pt demonstrates improvement in function in comparison with yesterdays assessment. Pt continues to have standing pharyngeal secretions that he does not clear independently. WIth moderate verbal cues and suction applied to base of tongue, pt was able increase force of cough to retrieve secretions, though they immediately return. Pt is able to initiate a swallow consistently with ice chips, but given poor secretion management, he is not ready for PO intake at this time. Recommend Cortrak until pt is able to demonstrate independent secretion management. Will follow for readiness.    HPI HPI: 75 year old male w/ sig medical h/o: CAD, CHF (EF 40-45%), GIB, Cirrhosis, Hep C and hepatocellular carcinoma, thrombocytopenia and ETOH abuse. Admitted to the ER on 9/21 after being found on floor w/ left sided facial droop, left sided weakness and dysarthria. Dx with right MCA infarct. He was transferred to interventional radiology where he underwent thrombectomy of the R MCA clot. He returned to the ICU on full vent support. Extubated 9/23.       SLP Plan  Continue with current plan of care     Recommendations  Diet recommendations: NPO                General recommendations: Rehab consult Oral Care Recommendations: Oral care QID Follow up Recommendations: Inpatient Rehab Plan: Continue with current plan of care       GO                [name], Katherene Ponto 06/29/2016, 10:33 AM

## 2016-06-29 NOTE — Progress Notes (Signed)
**  Preliminary report by tech**  Bilateral lower extremity venous duplex completed. Technically difficult study due to acoustic shadowing. The popliteal vein in the right lower extremity appears to be non-compressible of an unknown etiology. However, color fill and doppler signals are normal. Deep vein thrombosis of the right lower extremity can not be ruled out. There is no evidence of superficial vein thrombosis involving the right lower extremity. There is no evidence of deep or superficial vein thrombosis involving the left lower extremity. There is no evidence of Baker's cysts bilaterally.  06/29/16 12:41 PM Nathan Frank RVT

## 2016-06-29 NOTE — Progress Notes (Signed)
Nutrition Follow-up  INTERVENTION:   Once Cortrak placed Vital AF 1.2 @ 70 ml/hr (1680 ml per day) Provides: 2016 kcal, 126 grams  Protein, and 1362 ml H2O.   NUTRITION DIAGNOSIS:   Inadequate oral intake related to inability to eat as evidenced by NPO status. Ongoing.   GOAL:  Patient will meet greater than or equal to 90% of their needs Not met.   MONITOR:   TF tolerance, I & O's, Labs  REASON FOR ASSESSMENT:   Consult Enteral/tube feeding initiation and management  ASSESSMENT:   75 year old male admitted on 9/21 with right MCA infarct. S/P thrombectomy of the R MCA clot. He returned to the ICU on full vent support.   9/23 extubated 9/24 failed swallow 9/25 plan for cortrak tube and start enteral nutrition  Medications reviewed and include: thaimine, carafate Labs reviewed  Diet Order:  Diet NPO time specified  Skin:  Reviewed, no issues (skin tear leg)  Last BM:  9/24  Height:   Ht Readings from Last 1 Encounters:  06/25/16 _0  (1.778 m)    Weight:   Wt Readings from Last 1 Encounters:  06/29/16 213 lb 6.5 oz (96.8 kg)    Ideal Body Weight:  75.5 kg  BMI:  Body mass index is 30.62 kg/m.  Estimated Nutritional Needs:   Kcal:  1950-2150  Protein:  100-120 grams  Fluid:  2 L  EDUCATION NEEDS:   No education needs identified at this time  [place], [place], [place] Pager [phone] After Hours Pager

## 2016-06-29 NOTE — Progress Notes (Signed)
Physical Therapy Treatment Patient Details Name: Nathan Frank MRN: [id number] DOB: [birth year] Today's Date: 06/29/2016    History of Present Illness 75 year old male w/ sig medical h/o: CAD, CHF (EF 40-45%), GIB, Cirrhosis, Hep C and hepatocellular carcinoma, thrombocytopenia and ETOH abuse. Admitted to the ER on 9/21 after being found on floor w/ left sided facial droop, left sided weakness and dysarthria. Dx with right MCA infarct. He was transferred to interventional radiology where he underwent thrombectomy of the R MCA clot. He returned to the ICU on full vent support. Extubated 9/23.      PT Comments    Patient progressing towards goals, tolerated pre gait standing exercises and functional tasks this session. Patient assisted back to bed at nsg request upon conclusion of activity. Will continue current POC.  Follow Up Recommendations  CIR     Equipment Recommendations  None recommended by PT    Recommendations for Other Services OT consult;Rehab consult     Precautions / Restrictions Precautions Precautions: Fall    Mobility  Bed Mobility Overal bed mobility: Needs Assistance Bed Mobility: Rolling;Sidelying to Sit;Sit to Sidelying Rolling: Min assist Sidelying to sit: Mod assist;HOB elevated     Sit to sidelying: Min assist General bed mobility comments: + rail; rolled bil for cleaning (incontinent of BM on arrival); requires step by step cues  Transfers Overall transfer level: Needs assistance Equipment used: None   Sit to Stand: Mod assist;From elevated surface Stand pivot transfers: Mod assist       General transfer comment: VCs for hand placement, inital +2 moderate assist to stand the first x then able to perform x3 with one person moderate assist. SPT to [facility] then back to bed  Ambulation/Gait                 Stairs            Wheelchair Mobility    Modified Rankin (Stroke Patients Only) Modified Rankin (Stroke Patients  Only) Pre-Morbid Rankin Score: Moderate disability Modified Rankin: Severe disability     Balance   Sitting-balance support: No upper extremity supported Sitting balance-Leahy Scale: Good     Standing balance support: Bilateral upper extremity supported Standing balance-Leahy Scale: Poor Standing balance comment: flexed posture with cues to facilite upright positioning required                    Cognition Arousal/Alertness: Awake/alert Behavior During Therapy: WFL for tasks assessed/performed Overall Cognitive Status: Impaired/Different from baseline Area of Impairment: Following commands;Problem solving Orientation Level:  (oriented to name, date, place, stroke)   Memory: Decreased short-term memory Following Commands: Follows one step commands with increased time     Problem Solving: Slow processing;Difficulty sequencing;Requires verbal cues;Requires tactile cues      Exercises Other Exercises Other Exercises: Weight shifts in standing with bilateral UE supported on back of chair x5 in each direction Other Exercises: Single leg stance with bilateral UE support in standing x5 each leg (increased assist for standing on LLE Other Exercises: Long arc quads sitting EOB x5 Other Exercises: SLRs in supine x5 each leg Other Exercises: continuous ankle pumps 30 seconds bilaterally    General Comments        Pertinent Vitals/Pain Pain Assessment: No/denies pain    Home Living                      Prior Function            PT  Goals (current goals can now be found in the care plan section) Acute Rehab PT Goals Patient Stated Goal: To go home PT Goal Formulation: With patient Time For Goal Achievement: 07/05/16 Potential to Achieve Goals: Good Progress towards PT goals: Progressing toward goals    Frequency    Min 4X/week      PT Plan Current plan remains appropriate    Co-evaluation             End of Session Equipment Utilized  During Treatment: Gait belt Activity Tolerance: Patient tolerated treatment well Patient left: in bed;with call bell/phone within reach;with bed alarm set;with SCD's reapplied     Time: [id number] PT Time Calculation (min) (ACUTE ONLY): 18 min  Charges:  $Therapeutic Activity: 8-22 mins                    G CodesDuncan Frank 2016/07/07, 3:21 PM Nathan Frank, [place] DPT  [phone]

## 2016-06-29 NOTE — Evaluation (Signed)
Speech Language Pathology Evaluation Patient Details Name: Nathan Frank MRN: [id number] DOB: March 31, 1941 Today's Date: 06/29/2016 Time: 0950-1010 SLP Time Calculation (min) (ACUTE ONLY): 20 min  Problem List:  Patient Active Problem List   Diagnosis Date Noted  . CVA (cerebral infarction) 06/25/2016  . Cerebrovascular accident (CVA) due to occlusion of precerebral artery ([place]) 06/25/2016  . Acute respiratory failure with hypoxemia ([place])   . Hypomagnesemia 06/09/2016  . Acute on chronic diastolic CHF (congestive heart failure) ([place]) 06/09/2016  . Alcoholic cirrhosis of liver with ascites ([place])   . Other specified hypothyroidism   . Hepatocellular carcinoma ([place])   . Paroxysmal atrial fibrillation (H[facility])   . Alcohol withdrawal ([place]) 05/27/2016  . Generalized weakness 05/27/2016  . Hypokalemia 05/27/2016  . Dehydration 05/27/2016  . Atrial fibrillation with rapid ventricular response ([place]) 04/28/2016  . Diarrhea 04/28/2016  . SIRS (systemic inflammatory response syndrome) ([place]) 04/19/2016  . Alcohol use disorder, moderate, in controlled environment, dependence ([place])   . Lactic acidosis   . Tachycardia   . Atrial fibrillation with RVR ([place]) 04/18/2016  . Chest pain 04/18/2016  . Hypotension 04/18/2016  . Elevated lactic acid level 04/18/2016  . Atrial flutter with rapid ventricular response ([place]) 04/18/2016  . Chronic atrial fibrillation ([place]) 04/18/2016  . SVT (supraventricular tachycardia) ([place])   . Prolonged Q-T interval on ECG   . Neuropathy ([place])   . Essential hypertension   . Thrombocytopenia ([place]) 04/16/2015  . Abnormal TSH 04/16/2015  . NSTEMI (non-ST elevated myocardial infarction) ([place])   . Acute gastric ulcer   . Acute gastritis with hemorrhage   . GI bleed due to NSAIDs 12/13/2014  . Right knee pain 12/13/2014  . Dizziness and giddiness 12/13/2014  . Upper GI bleeding 12/13/2014  . Heroin overdose 02/20/2014  . S/P alcohol detoxification 02/02/2014  . Alcohol  dependence ([place]) 02/02/2014  . Depressive disorder 02/01/2014  . Symptomatic cholelithiasis 12/15/2013  . Abdominal pain 12/14/2013  . Gastritis 06/07/2012  . DVT of lower limb, acute ([place]) 06/06/2012  . Anemia 06/06/2012  . Oral thrush 06/05/2012  . Alcohol abuse 06/04/2012  . Weight loss 06/04/2012  . 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 Cirrhosis ([place]) 06/04/2012  . Hematuria 06/04/2012   Past Medical History:  Past Medical History:  Diagnosis Date  . Abnormal TSH   . Acute gastric ulcer   . Acute gastritis with hemorrhage   . Alcohol abuse   . Alcohol dependence ([place]) 02/02/2014  . Anemia   . CHF (congestive heart failure) ([place])   . Cirrhosis ([place]) 06/04/2012  . Depressive disorder 02/01/2014  . Dizziness and giddiness 12/13/2014  . DVT of lower limb, acute ([place]) 06/06/2012  . Essential hypertension   . Fatty liver   . Gallstones   . Gastritis 06/07/2012  . GERD (gastroesophageal reflux disease)   . GI bleed due to NSAIDs 12/13/2014  . Granulomatous gastritis   . Hematuria 06/04/2012  . Hepatitis C   . Hepatocellular carcinoma ([place])   . Heroin abuse    "I haven't done that since I don't know when."  . Heroin overdose 02/20/2014  . LV dysfunction    a. 04/2015: EF 45-50% by cath.  . Neuropathy ([place])   . NSTEMI (non-ST elevated myocardial infarction) ([place])    a. 04/2015 - patent coronaries. Etiology possibly due to coronary spasm versus embolus, stress cardiomyopathy (atypical), and aborted infarction related to plaque rupture with thrombosis and dissolution. Amlodipine started. Not on antiplatelets due to  GIB/cirrhosis history.  . Oral thrush 06/05/2012  . Polysubstance abuse    Rare marijuana.  No EtOH x 2 months.   . Prolonged Q-T interval on ECG    a. 12/2014 - treated with magnesium.  . Right knee pain 12/13/2014  . S/P alcohol detoxification 02/02/2014  . SVT (supraventricular tachycardia) ([place])    a. 12/2014 in setting of GIB, ETOH, NSAIDS,  gastritis.  . Symptomatic cholelithiasis 12/15/2013  . Thrombocytopenia ([place])   . Upper GI bleeding 12/13/2014  . Weight loss 06/04/2012   Past Surgical History:  Past Surgical History:  Procedure Laterality Date  . CARDIAC CATHETERIZATION N/A 04/15/2015   Procedure: Left Heart Cath and Coronary Angiography;  Surgeon: Belva Crome, MD;  Location: [facility];  Service: Cardiovascular;  Laterality: N/A;  . CIRCUMCISION    . ESOPHAGOGASTRODUODENOSCOPY  06/07/2012   Procedure: ESOPHAGOGASTRODUODENOSCOPY (EGD);  Surgeon: Milus Banister, MD;  Location: [facility];  Service: Endoscopy;  Laterality: N/A;  may need treatment of varices  . ESOPHAGOGASTRODUODENOSCOPY N/A 12/14/2014   Procedure: ESOPHAGOGASTRODUODENOSCOPY (EGD);  Surgeon: Jerene Bears, MD;  Location: [facility] ENDOSCOPY;  Service: Endoscopy;  Laterality: N/A;  . IR GENERIC HISTORICAL  06/25/2016   IR US GUIDE VASC ACCESS LEFT 06/25/2016 Corrie Mckusick, DO [facility]  . IR GENERIC HISTORICAL  06/25/2016   IR RADIOLOGY PERIPHERAL GUIDED IV START 06/25/2016 Corrie Mckusick, DO [facility]  . RADIOLOGY WITH ANESTHESIA N/A 06/25/2016   Procedure: RADIOLOGY WITH ANESTHESIA;  Surgeon: Luanne Bras, MD;  Location: [facility];  Service: Radiology;  Laterality: N/A;  . TONSILLECTOMY     HPI:  75 year old male w/ sig medical h/o: CAD, CHF (EF 40-45%), GIB, Cirrhosis, Hep C and hepatocellular carcinoma, thrombocytopenia and ETOH abuse. Admitted to the ER on 9/21 after being found on floor w/ left sided facial droop, left sided weakness and dysarthria. Dx with right MCA infarct. He was transferred to interventional radiology where he underwent thrombectomy of the R MCA clot. He returned to the ICU on full vent support. Extubated 9/23.    Assessment / Plan / Recommendation Clinical Impression  Pt demonstrates relatively good cognitive function, possibly at baseline. He is able to sustain and alterate attention in both visual fields, use enviroment for  complex orientation questions independnetly, recall 4 words after5 and 10 minute delay recall, follow multistep commands. He does have some decreased awareness of physical deficits and needs moderate assist with functional problem solving in unfamiliar tasks. Recommend f/u for cognition with functional problem solving to increase indpendence with ADLs. Pt would benefit from CIR at d/c.     SLP Assessment  Patient needs continued Speech Lanaguage Pathology Services    Follow Up Recommendations  Inpatient Rehab    Frequency and Duration min 3x week  2 weeks      SLP Evaluation Cognition  Overall Cognitive Status: Impaired/Different from baseline Arousal/Alertness: Awake/alert Orientation Level: Oriented X4 Attention: Alternating Alternating Attention: Appears intact Memory: Appears intact Awareness: Impaired Awareness Impairment: Anticipatory impairment Problem Solving: Impaired Problem Solving Impairment: Functional basic       Comprehension  Auditory Comprehension Overall Auditory Comprehension: Appears within functional limits for tasks assessed    Expression Verbal Expression Overall Verbal Expression: Appears within functional limits for tasks assessed   Oral / Motor  Oral Motor/Sensory Function Overall Oral Motor/Sensory Function: Moderate impairment Facial ROM: Reduced left;Suspected CN VII (facial) dysfunction Facial Symmetry: Abnormal symmetry left;Suspected CN VII (facial) dysfunction Facial Strength: Reduced left;Suspected CN VII (facial)  dysfunction Facial Sensation: Reduced left;Suspected CN V (Trigeminal) dysfunction Lingual ROM: Reduced left;Reduced right;Suspected CN XII (hypoglossal) dysfunction Lingual Symmetry: Within Functional Limits Lingual Strength: Reduced;Suspected CN XII (hypoglossal) dysfunction Lingual Sensation: Within Functional Limits Velum: Within Functional Limits Mandible: Within Functional Limits Motor Speech Overall Motor Speech: Appears  within functional limits for tasks assessed   GO                   [place], MA CCC-SLP [id number]  Lynann Beaver 06/29/2016, 10:30 AM

## 2016-06-29 NOTE — Care Management Important Message (Signed)
Important Message  Patient Details  Name: Nathan Frank MRN: [id number] Date of Birth: 1940/12/13   Medicare Important Message Given:  Other (see comment)    [place], Mckaila Duffus [place] 06/29/2016, 11:16 AM

## 2016-06-29 NOTE — Progress Notes (Signed)
STROKE TEAM PROGRESS NOTE   SUBJECTIVE (INTERVAL HISTORY) Now family at bedside. He is awake and alert, mild left hemiparesis, did not pass swallow and needs TF. Still has difficulty controlling secretions.    OBJECTIVE Temp:  [97.5 F (36.4 C)-98.7 F (37.1 C)] 97.6 F (36.4 C) (09/25 1550) Pulse Rate:  [77-144] 81 (09/25 1700) Cardiac Rhythm: Normal sinus rhythm (09/25 0800) Resp:  [18-32] 19 (09/25 1700) BP: (104-138)/(63-100) 104/66 (09/25 1700) SpO2:  [93 %-100 %] 100 % (09/25 1700) Weight:  [213 lb 6.5 oz (96.8 kg)] 213 lb 6.5 oz (96.8 kg) (09/25 0500)  CBC:   Recent Labs Lab 06/28/16 0305 06/29/16 0652  WBC 11.3* 8.6  NEUTROABS 9.8* 6.5  HGB 10.3* 10.1*  HCT 31.1* 29.7*  MCV 99.7 97.4  PLT 117* PLATELET CLUMPS NOTED ON SMEAR, COUNT APPEARS ADEQUATE    Basic Metabolic Panel:   Recent Labs Lab 06/28/16 0305 06/29/16 0332  NA 141  140 140  K 3.9  3.9 3.9  CL 118*  117* 118*  CO2 18*  17* 14*  GLUCOSE 110*  110* 104*  BUN 8  8 11   CREATININE 0.82  0.80 0.87  CALCIUM 7.4*  7.3* 7.8*  MG 1.8 2.0  PHOS 2.6  2.7 2.6    Lipid Panel:     Component Value Date/Time   CHOL 87 06/26/2016 0500   TRIG 91 06/27/2016 0219   HDL 26 (L) 06/26/2016 0500   CHOLHDL 3.3 06/26/2016 0500   VLDL 23 06/26/2016 0500   LDLCALC 38 06/26/2016 0500   HgbA1c:  Lab Results  Component Value Date   HGBA1C 4.5 (L) 06/26/2016   Urine Drug Screen:     Component Value Date/Time   LABOPIA POSITIVE (A) 06/09/2016 2059   COCAINSCRNUR NONE DETECTED 06/09/2016 2059   COCAINSCRNUR NEGATIVE 12/13/2014 2353   LABBENZ POSITIVE (A) 06/09/2016 2059   LABBENZ POSITIVE (A) 12/13/2014 2353   AMPHETMU NONE DETECTED 06/09/2016 2059   THCU NONE DETECTED 06/09/2016 2059   LABBARB NONE DETECTED 06/09/2016 2059      IMAGING I have personally reviewed the radiological images below and agree with the radiology interpretations.  MRI brain without contrast 06/26/2016 1. Acute  moderately large nonhemorrhagic right MCA infarct. 2. Chronic right frontal left parietal infarcts. 3. Small chronic extra-axial collection or dural thickening in the anterior left frontal region.  CTA Head and Neck 06/28/2016 CT HEAD:  Evolving large RIGHT MCA territory nonhemorrhagic infarct. Old RIGHT frontal and LEFT parietal lobe infarcts. LEFT frontal dural thickening versus chronic subdural hematoma. 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 Small bilateral pleural effusions. Sub cm probable bone islands T4 and T5 though, if there is a history of cancer, recommend bone scan. ( History of hepato-cellular carcinoma) CTA HEAD:  Occluded RIGHT M3 segment. Mild stenosis distal RIGHT M1 segment. Mild luminal irregularity of the RIGHT M2 segment may be due to extrinsic deformity, atherosclerosis or vasospasm. No large vessel occlusion.   Ct Head Code Stroke Wo Contrast 06/25/2016 1. Evolving moderate-sized acute right MCA infarct. No evidence of acute intracranial hemorrhage.  2. Chronic ischemic changes as above. 3. Small volume extra-axial material over the anterior left frontal lobe, present since 2014. This may reflect dural thickening nor a chronic, complex subdural collection. No mass effect.   Cerebral Angio 06/25/2016 S/P Rt common carotid arteriogram,followed by complete revascularization of occluded RT MCA prox with x 3 passes with solitaire 4 mm x  40 mm FR retrieval device ,and ACE penumbra aspiration device, and 7.2 mg of IA integrelin superselectively achieving a TICI 3 reperfusion  Ct Head Wo Contrast post IR 06/25/2016 Dense right MCA with generalized decreased attenuation involving the right parietal and temporal lobes consistent with acute ischemia. Areas of subacute to chronic ischemia as described. No acute hemorrhage is noted.   TTE 06/16/16  - Left ventricle: Posterior , septal and apical hypokinesis  The   cavity size was mildly dilated. Wall thickness was normal.   Systolic function was mildly to moderately reduced. The estimated   ejection fraction was in the range of 40% to 45%. Left   ventricular diastolic function parameters were normal. - Mitral valve: There was mild regurgitation. - Atrial septum: No defect or patent foramen ovale was identified.  LE venous doppler - The popliteal vein in the right lower extremity appears to be   non-compressible of an unknown etiology. However, color fill and   doppler signals are normal. Deep vein thrombosis of the right   lower extremity can not be ruled out. There is no evidence of   superficial vein thrombosis involving the right lower extremity.   There is no evidence of deep or superficial vein thrombosis   involving the left lower extremity. - No evidence of Baker&'s cyst on the right or left.  PHYSICAL EXAM  Temp:  [97.5 F (36.4 C)-98.7 F (37.1 C)] 97.6 F (36.4 C) (09/25 1550) Pulse Rate:  [77-144] 81 (09/25 1700) Resp:  [18-32] 19 (09/25 1700) BP: (104-138)/(63-100) 104/66 (09/25 1700) SpO2:  [93 %-100 %] 100 % (09/25 1700) Weight:  [213 lb 6.5 oz (96.8 kg)] 213 lb 6.5 oz (96.8 kg) (09/25 0500)  General - Well nourished, well developed, mildly lethargic.  Ophthalmologic - Fundi not visualized due to noncooperation.  Cardiovascular - Regular rate and rhythm.  Mental Status -  Level of arousal and orientation to time, place, and person were intact. Language including expression, naming, repetition, comprehension was assessed and found intact, moderate dysarthria.  Cranial Nerves II - XII - II - Visual field intact OU. III, IV, VI - Extraocular movements intact. V - Facial sensation intact bilaterally. VII - Facial movement intact bilaterally. VIII - Hearing & vestibular intact bilaterally. X - Palate elevates symmetrically. XI - Chin turning & shoulder shrug intact bilaterally. XII - Tongue protrusion  intact.  Motor Strength - The patient's strength was 5/5 RUE and RLE, 4/5 LUE and 4/5 LLE proximal and 2+/5 distally.  Bulk was normal and fasciculations were absent.   Motor Tone - Muscle tone was assessed at the neck and appendages and was normal.  Reflexes - The patient's reflexes were 1+ in all extremities and he had no pathological reflexes.  Sensory - Light touch, temperature/pinprick were assessed and were symmetrical.    Coordination - The patient had normal movements in the hand with no ataxia or dysmetria.  Tremor was absent.  Gait and Station - not tested due to safety concerns.   ASSESSMENT/PLAN [name] is a 75 y.o. male with history of CAD, CHF (EF 40-45%), GIB, Cirrhosis, Hep C and hepatocellular carcinoma, thrombocytopenia and ETOH abuse presenting with L hemiparesis, L facial and dysarthria. He did not receive IV t-PA due to delay in arrival. He was taken to IR where he received TICI 3 revascularization of occluded R MCA using solitaire, penumbra and IA integrilin.   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   CTA neck showed right M1 stenosis and right M3 occlusion  Cerebral angio - TICI3 vascularization of occluded right MCA  2D Echo - EF 40-45% on 06/16/16.   LDL 38  HgbA1c 4.5  lovenox for VTE prophylaxis Diet NPO time specified  ASA 325 mg for secondary stroke prevention  Ongoing aggressive stroke risk factor management  Therapy recommendations:  pending   Disposition:  pending   AFib  Hx of palpitation and SVT but diagnosed with afib RVR in 04/2016  Followed up with cardiology   Considered not anticoagulation candidate due to hx of GIB, substance abuse, alcohol abuse and noncompliant with medication.  Currently not anticoagulation candidate  due to large right MCA infarct  On ASA 325mg  now  Need close follow up with cardiology after CIR to consider anticoagulation if he is candidate at that time.  DVT  B/L DVT at LEs indeterminate age on 06/14/16  No anticoagulation treatment  Repeat LE venous doppler this time did not see clear cut DVT  Will not treat this time  Liver cirrhosis  Likely due to alcohol and hepC  CT abd/pelvis showed evidence of liver nodule  12/2014 EGD showed no esophageal varices  Albumin low at 1.8  INR 1.76 in 05/2016, now 1.25  Ammonia 102 in 05/2016, now 44  AST 252 in 04/2016, now 75  ? Liver cancer ?  CT abd/pelvis 05/2016 did not show liver mass  MRI 05/2016 did not show liver mass   Hx of GIB  12/2014 black stool  EGD showed gastritis - contribute to NSAIDs use  EGD did not show esophageal varices.  Hypertension  Stable Long-term BP goal normotensive  Dysphagia   Did not pass swallow  On TF  Other Stroke Risk Factors  Advanced age  Former Cigarette smoker  ETOH abuse, advised to drink no more than 2 drink(s) a day  Hx heroin use with OD, not used recently per chart  Overweight, Body mass index is 30.62 kg/m., recommend weight loss, diet and exercise as appropriate   Family hx stroke (father)  Coronary artery disease  CHF EF 40-45%  Other Active Problems  Hyperglycemia without hx diabetes 111-127   This patient is critically ill due to right MCA large infarct, liver cirrhosis, afib, hx of DVT, hx of GIB, dysphagia and at significant risk of neurological worsening, death form recurrent infarct, hemorrhagic transformation, heart failure, aspiration pneumonia, respiratory failure. This patient's care requires constant monitoring of vital signs, hemodynamics, respiratory and cardiac monitoring, review of multiple databases, neurological assessment, discussion with family, other specialists and medical decision making of high complexity. I spent 45 minutes of  neurocritical care time in the care of this patient.   Rosalin Hawking, MD PhD Stroke Neurology 06/29/2016 6:23 PM   To contact Stroke Continuity provider, please refer to [email or web address] After hours, contact General Neurology

## 2016-06-30 ENCOUNTER — Encounter (HOSPITAL_COMMUNITY): Payer: Self-pay | Admitting: Interventional Radiology

## 2016-06-30 ENCOUNTER — Ambulatory Visit: Payer: Self-pay | Admitting: Physician Assistant

## 2016-06-30 DIAGNOSIS — K7030 Alcoholic cirrhosis of liver without ascites: Secondary | ICD-10-CM

## 2016-06-30 DIAGNOSIS — I4891 Unspecified atrial fibrillation: Secondary | ICD-10-CM

## 2016-06-30 LAB — URINE CULTURE: CULTURE: NO GROWTH

## 2016-06-30 LAB — CULTURE, RESPIRATORY W GRAM STAIN: Culture: NORMAL

## 2016-06-30 LAB — BASIC METABOLIC PANEL
ANION GAP: 7 (ref 5–15)
BUN: 12 mg/dL (ref 6–20)
CHLORIDE: 116 mmol/L — AB (ref 101–111)
CO2: 17 mmol/L — AB (ref 22–32)
CREATININE: 0.95 mg/dL (ref 0.61–1.24)
Calcium: 8 mg/dL — ABNORMAL LOW (ref 8.9–10.3)
GFR calc non Af Amer: >60 mL/min (ref >60)
GLUCOSE: 147 mg/dL — AB (ref 65–99)
POTASSIUM: 2.9 mmol/L — AB (ref 3.5–5.1)
Sodium: 140 mmol/L (ref 135–145)

## 2016-06-30 LAB — GLUCOSE, CAPILLARY
GLUCOSE-CAPILLARY: 107 mg/dL — AB (ref 65–99)
GLUCOSE-CAPILLARY: 110 mg/dL — AB (ref 65–99)
GLUCOSE-CAPILLARY: 116 mg/dL — AB (ref 65–99)
GLUCOSE-CAPILLARY: 130 mg/dL — AB (ref 65–99)
GLUCOSE-CAPILLARY: 137 mg/dL — AB (ref 65–99)
GLUCOSE-CAPILLARY: 145 mg/dL — AB (ref 65–99)
Glucose-Capillary: 130 mg/dL — ABNORMAL HIGH (ref 65–99)

## 2016-06-30 LAB — CBC
HEMATOCRIT: 29.4 % — AB (ref 39.0–52.0)
HEMOGLOBIN: 9.8 g/dL — AB (ref 13.0–17.0)
MCH: 32.6 pg (ref 26.0–34.0)
MCHC: 33.3 g/dL (ref 30.0–36.0)
MCV: 97.7 fL (ref 78.0–100.0)
Platelets: 132 10*3/uL — ABNORMAL LOW (ref 150–400)
RBC: 3.01 MIL/uL — AB (ref 4.22–5.81)
RDW: 16.1 % — ABNORMAL HIGH (ref 11.5–15.5)
WBC: 6.2 10*3/uL (ref 4.0–10.5)

## 2016-06-30 LAB — CULTURE, RESPIRATORY

## 2016-06-30 MED ORDER — ALBUTEROL SULFATE (2.5 MG/3ML) 0.083% IN NEBU
2.5000 mg | INHALATION_SOLUTION | Freq: Three times a day (TID) | RESPIRATORY_TRACT | Status: DC
  Administered 2016-06-30 (×2): 2.5 mg via RESPIRATORY_TRACT
  Filled 2016-06-30 (×2): qty 3

## 2016-06-30 MED ORDER — ACETYLCYSTEINE 20 % IN SOLN
3.0000 mL | Freq: Three times a day (TID) | RESPIRATORY_TRACT | Status: DC
  Administered 2016-06-30 (×3): 3 mL via RESPIRATORY_TRACT
  Filled 2016-06-30 (×3): qty 4

## 2016-06-30 MED ORDER — SUCRALFATE 1 G PO TABS: 1.0000 g | ORAL_TABLET | Freq: Two times a day (BID) | ORAL | Status: DC

## 2016-06-30 MED ORDER — ALBUTEROL SULFATE (2.5 MG/3ML) 0.083% IN NEBU
2.5000 mg | INHALATION_SOLUTION | Freq: Three times a day (TID) | RESPIRATORY_TRACT | Status: AC
  Administered 2016-07-01 (×3): 2.5 mg via RESPIRATORY_TRACT
  Filled 2016-06-30 (×3): qty 3

## 2016-06-30 MED ORDER — ASPIRIN 325 MG PO TABS
325.0000 mg | ORAL_TABLET | Freq: Every day | ORAL | Status: DC
  Administered 2016-06-30 – 2016-07-04 (×5): 325 mg
  Filled 2016-06-30 (×5): qty 1

## 2016-06-30 MED ORDER — ACETYLCYSTEINE 20 % IN SOLN
3.0000 mL | Freq: Three times a day (TID) | RESPIRATORY_TRACT | Status: AC
  Administered 2016-07-01 (×3): 3 mL via RESPIRATORY_TRACT
  Filled 2016-06-30 (×3): qty 4

## 2016-06-30 NOTE — Progress Notes (Signed)
Speech Language Pathology Treatment: Dysphagia  Patient Details Name: Nathan Frank MRN: [id number] DOB: Aug 25, 1941 Today's Date: 06/30/2016 Time: [id number] SLP Time Calculation (min) (ACUTE ONLY): 16 min  Assessment / Plan / Recommendation Clinical Impression  SLP noted a wet vocal quality upon arrival during phonation indicating continued decreased ability to sense and clear secretions. Min verbal cues for volitional cough had moderate effectiveness momentarily clearing laryngeal secretions which return. Trials applesauce with suspected delayed swallow initiation and question of mild wet vocal quality. Therapeutic intervention/excercises included production of volitional cough/throat clear, glottal stop to increase subglottal pressure and vocal cord adduction and pitch variations to increased laryngeal elevation. Pt is moving closer to appropriateness to conduct MBS for evaluation of function/dysfunction (motor vs sensory). Encouraged pt to  practice exercises. Will continue to follow.    HPI HPI: 75 year old male w/ sig medical h/o: CAD, CHF (EF 40-45%), GIB, Cirrhosis, Hep C and hepatocellular carcinoma, thrombocytopenia and ETOH abuse. Admitted to the ER on 9/21 after being found on floor w/ left sided facial droop, left sided weakness and dysarthria. Dx with right MCA infarct. He was transferred to interventional radiology where he underwent thrombectomy of the R MCA clot. He returned to the ICU on full vent support. Extubated 9/23.       SLP Plan  Continue with current plan of care     Recommendations  Diet recommendations: NPO Medication Administration: Via alternative means                Oral Care Recommendations: Oral care QID Follow up Recommendations: Inpatient Rehab Plan: Continue with current plan of care       GO                Nathan Frank 06/30/2016, 10:27 AM   Nathan Frank Nathan Frank.[employer] [phone]

## 2016-06-30 NOTE — Progress Notes (Signed)
RT attempted CPT at this time however pt stated he "needed to have BM first". RT called for assistance for pt and will come back later for CPT.

## 2016-06-30 NOTE — Progress Notes (Signed)
I met with pt as he walked in hallway with PT and OT. Interactive and alert with therapists. I have placed a call to his wife to discuss pt's rehab venue options. [phone]

## 2016-06-30 NOTE — Evaluation (Signed)
Occupational Therapy Evaluation Patient Details Name: Nathan Frank MRN: [id number] DOB: Dec 30, 1940 Today's Date: 06/30/2016    History of Present Illness 75 year old male w/ sig medical h/o: CAD, CHF (EF 40-45%), GIB, Cirrhosis, Hep C and hepatocellular carcinoma, thrombocytopenia and ETOH abuse. Admitted to the ER on 9/21 after being found on floor w/ left sided facial droop, left sided weakness and dysarthria. Dx with right MCA infarct. He was transferred to interventional radiology where he underwent thrombectomy of the R MCA clot. He returned to the ICU on full vent support. Extubated 9/23.     Clinical Impression   PT admitted with R MCA infarct. Pt currently with functional limitiations due to the deficits listed below (see OT problem list). Pt is from home with wife.  Pt will benefit from skilled OT to increase their independence and safety with adls and balance to allow discharge CIR. Pt progressing toward goals.      Follow Up Recommendations  CIR    Equipment Recommendations  3 in 1 bedside comode    Recommendations for Other Services Rehab consult     Precautions / Restrictions Precautions Precautions: Fall Restrictions Weight Bearing Restrictions: No      Mobility Bed Mobility Overal bed mobility: Needs Assistance Bed Mobility: Rolling;Sidelying to Sit;Sit to Sidelying Rolling: Min assist;Max assist Sidelying to sit: Max assist       General bed mobility comments: min (A) for initiation to the L to roll. pt requries Max (A) to roll R. Pt once eob requires MAx (A) due to inability to push up with L UE  Transfers Overall transfer level: Needs assistance Equipment used: 2 person hand held assist Transfers: Sit to/from Stand Sit to Stand: Mod assist;From elevated surface Stand pivot transfers: Mod assist       General transfer comment: pt reaching for OT/PT forearms to help with anterior weight shift. Pt giving verbal cue to place head between  therapist shoulders and provided rocking momentum of 1 2 3  to initiate sit,>stand. pt able to power up     Balance Overall balance assessment: Needs assistance Sitting-balance support: Bilateral upper extremity supported;Feet supported Sitting balance-Leahy Scale: Fair     Standing balance support: Bilateral upper extremity supported;During functional activity Standing balance-Leahy Scale: Poor                              ADL Overall ADL's : Needs assistance/impaired     Grooming: Wash/dry hands;Wash/dry face;Sitting;Minimal assistance                   Toilet Transfer: +2 for physical assistance;Moderate assistance           Functional mobility during ADLs: +2 for physical assistance;Minimal assistance General ADL Comments: pt requires cues for sequence and anterior weight shift. ( see tranfer section) pt states his wife Kendrick Fries is his POA     Estate agent      Pertinent Vitals/Pain Pain Assessment: No/denies pain Faces Pain Scale: Hurts a little bit Pain Location: general grimacing with movement but did not complain Pain Intervention(s): Monitored during session     Hand Dominance Right   Extremity/Trunk Assessment Upper Extremity Assessment Upper Extremity Assessment: LUE deficits/detail LUE Deficits / Details: edema present    Lower Extremity Assessment Lower Extremity Assessment: Defer to PT evaluation   Cervical / Trunk Assessment Cervical / Trunk Assessment: Other exceptions Cervical /  Trunk Exceptions: rounded shoulders   Communication Communication Communication: No difficulties   Cognition Arousal/Alertness: Awake/alert Behavior During Therapy: WFL for tasks assessed/performed Overall Cognitive Status: Impaired/Different from baseline Area of Impairment: Awareness       Following Commands: Follows multi-step commands inconsistently   Awareness: Emergent Problem Solving: Slow processing General  Comments: requires incr (A) for sequence of task and delayed initiation   General Comments       Exercises Exercises: General Lower Extremity     Shoulder Instructions      Home Living Family/patient expects to be discharged to:: Private residence Living Arrangements: Spouse/significant other Available Help at Discharge: Family;Available 24 hours/day Type of Home: House Home Access: Stairs to enter [employer] of Steps: 4 Entrance Stairs-Rails: Right;Left;Can reach both Home Layout: One level     Bathroom Shower/Tub: Occupational psychologist: Handicapped height Bathroom Accessibility: Yes   Home Equipment: Cane - single point;Grab bars - tub/shower;Hand held Tourist information centre manager - 2 wheels   Additional Comments: States his son coming from [place] to help out as well  Lives With: Spouse    Prior Functioning/Environment Level of Independence: Independent with assistive device(s)        Comments: Used SPC for mobility but recently has been using RW         OT Problem List: Decreased strength;Decreased activity tolerance;Impaired balance (sitting and/or standing);Decreased knowledge of use of DME or AE;Cardiopulmonary status limiting activity;Increased edema   OT Treatment/Interventions: Self-care/ADL training;Therapeutic exercise;DME and/or AE instruction;Therapeutic activities;Patient/family education;Balance training    OT Goals(Current goals can be found in the care plan section) Acute Rehab OT Goals Patient Stated Goal: get in chair OT Goal Formulation: With patient Time For Goal Achievement: 06/24/16 Potential to Achieve Goals: Good  OT Frequency: Min 2X/week   Barriers to D/C:            Co-evaluation PT/OT/SLP Co-Evaluation/Treatment: Yes Reason for Co-Treatment: Complexity of the patient's impairments (multi-system involvement);For patient/therapist safety PT goals addressed during session: Mobility/safety with mobility OT goals  addressed during session: ADL's and self-care;Strengthening/ROM      End of Session Equipment Utilized During Treatment: Gait belt;Oxygen Nurse Communication: Mobility status;Precautions  Activity Tolerance: Patient tolerated treatment well Patient left: in chair;with call bell/phone within reach;with chair alarm set   Time: [id number] OT Time Calculation (min): 26 min Charges:  OT General Charges $OT Visit: 1 Procedure OT Evaluation $OT Eval High Complexity: 1 Procedure G-Codes:    Peri Maris 07-29-16, 2:38 PM  Jeri Modena   OTR/L Pager: [phone] Office: [phone] .

## 2016-06-30 NOTE — Progress Notes (Signed)
STROKE TEAM PROGRESS NOTE   SUBJECTIVE (INTERVAL HISTORY) No family present. Pt lying in bed. Complains of a weak cough. Wants medication help, we instructed him on self suctioning. A. fib RVR last night, put on metoprolol 25 mg bid.   OBJECTIVE Temp:  [97.5 F (36.4 C)-98.3 F (36.8 C)] 97.8 F (36.6 C) (09/26 0914) Pulse Rate:  [74-144] 124 (09/26 0914) Cardiac Rhythm: Atrial fibrillation (09/26 0321) Resp:  [18-28] 20 (09/26 0914) BP: (104-133)/(66-91) 121/82 (09/26 0914) SpO2:  [93 %-100 %] 97 % (09/26 0914) Weight:  [102.2 kg (225 lb 6.4 oz)-103.8 kg (228 lb 14.4 oz)] 103.8 kg (228 lb 14.4 oz) (09/26 0637)  CBC:   Recent Labs Lab 06/28/16 0305 06/29/16 0652 06/30/16 0425  WBC 11.3* 8.6 6.2  NEUTROABS 9.8* 6.5  --   HGB 10.3* 10.1* 9.8*  HCT 31.1* 29.7* 29.4*  MCV 99.7 97.4 97.7  PLT 117* PLATELET CLUMPS NOTED ON SMEAR, COUNT APPEARS ADEQUATE 132*    Basic Metabolic Panel:   Recent Labs Lab 06/28/16 0305 06/29/16 0332 06/30/16 0425  NA 141  140 140 140  K 3.9  3.9 3.9 2.9*  CL 118*  117* 118* 116*  CO2 18*  17* 14* 17*  GLUCOSE 110*  110* 104* 147*  BUN 8  8 11 12   CREATININE 0.82  0.80 0.87 0.95  CALCIUM 7.4*  7.3* 7.8* 8.0*  MG 1.8 2.0  --   PHOS 2.6  2.7 2.6  --     Lipid Panel:     Component Value Date/Time   CHOL 87 06/26/2016 0500   TRIG 91 06/27/2016 0219   HDL 26 (L) 06/26/2016 0500   CHOLHDL 3.3 06/26/2016 0500   VLDL 23 06/26/2016 0500   LDLCALC 38 06/26/2016 0500   HgbA1c:  Lab Results  Component Value Date   HGBA1C 4.5 (L) 06/26/2016   Urine Drug Screen:     Component Value Date/Time   LABOPIA POSITIVE (A) 06/09/2016 2059   COCAINSCRNUR NONE DETECTED 06/09/2016 2059   COCAINSCRNUR NEGATIVE 12/13/2014 2353   LABBENZ POSITIVE (A) 06/09/2016 2059   LABBENZ POSITIVE (A) 12/13/2014 2353   AMPHETMU NONE DETECTED 06/09/2016 2059   THCU NONE DETECTED 06/09/2016 2059   LABBARB NONE DETECTED 06/09/2016 2059       IMAGING I have personally reviewed the radiological images below and agree with the radiology interpretations.  MRI brain without contrast 06/26/2016 1. Acute moderately large nonhemorrhagic right MCA infarct. 2. Chronic right frontal left parietal infarcts. 3. Small chronic extra-axial collection or dural thickening in the anterior left frontal region.  CTA Head and Neck 06/28/2016 CT HEAD:  Evolving large RIGHT MCA territory nonhemorrhagic infarct. Old RIGHT frontal and LEFT parietal lobe infarcts. LEFT frontal dural thickening versus chronic subdural hematoma. 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 Small bilateral pleural effusions. Sub cm probable bone islands T4 and T5 though, if there is a history of cancer, recommend bone scan. ( History of hepato-cellular carcinoma) CTA HEAD:  Occluded RIGHT M3 segment. Mild stenosis distal RIGHT M1 segment. Mild luminal irregularity of the RIGHT M2 segment may be due to extrinsic deformity, atherosclerosis or vasospasm. No large vessel occlusion.   Ct Head Code Stroke Wo Contrast 06/25/2016 1. Evolving moderate-sized acute right MCA infarct. No evidence of acute intracranial hemorrhage.  2. Chronic ischemic changes as above. 3. Small volume extra-axial material over the anterior left frontal lobe, present since 2014. This may reflect dural  thickening nor a chronic, complex subdural collection. No mass effect.   Cerebral Angio 06/25/2016 S/P Rt common carotid arteriogram,followed by complete revascularization of occluded RT MCA prox with x 3 passes with solitaire 4 mm x 40 mm FR retrieval device ,and ACE penumbra aspiration device, and 7.2 mg of IA integrelin superselectively achieving a TICI 3 reperfusion  Ct Head Wo Contrast post IR 06/25/2016 Dense right MCA with generalized decreased attenuation involving the right parietal and temporal lobes consistent with acute  ischemia. Areas of subacute to chronic ischemia as described. No acute hemorrhage is noted.   TTE 06/16/16  - Left ventricle: Posterior , septal and apical hypokinesis The cavity size was mildly dilated. Wall thickness was normal. Systolic function was mildly to moderately reduced. The estimated ejection fraction was in the range of 40% to 45%. Left ventricular diastolic function parameters were normal. - Mitral valve: There was mild regurgitation. - Atrial septum: No defect or patent foramen ovale was identified.  LE venous doppler - The popliteal vein in the right lower extremity appears to be non-compressible of an unknown etiology. However, color fill and doppler signals are normal. Deep vein thrombosis of the right lower extremity can not be ruled out. There is no evidence of superficial vein thrombosis involving the right lower extremity. There is no evidence of deep or superficial vein thrombosis involving the left lower extremity. -  No evidence of Baker&'s cyst on the right or left.  PHYSICAL EXAM  Temp:  [97.5 F (36.4 C)-98.3 F (36.8 C)] 97.8 F (36.6 C) (09/26 0914) Pulse Rate:  [74-144] 124 (09/26 0914) Resp:  [18-28] 20 (09/26 0914) BP: (104-133)/(66-91) 121/82 (09/26 0914) SpO2:  [93 %-100 %] 97 % (09/26 0914) Weight:  [102.2 kg (225 lb 6.4 oz)-103.8 kg (228 lb 14.4 oz)] 103.8 kg (228 lb 14.4 oz) (09/26 [id number])  General - Well nourished, well developed, mildly lethargic.  Ophthalmologic - Fundi not visualized due to noncooperation.  Cardiovascular - Regular rate and rhythm.  Mental Status -  Level of arousal and orientation to time, place, and person were intact. Language including expression, naming, repetition, comprehension was assessed and found intact, moderate dysarthria.  Cranial Nerves II - XII - II - Visual field intact OU. III, IV, VI - Extraocular movements intact. V - Facial sensation intact bilaterally. VII - Facial movement intact bilaterally. VIII -  Hearing & vestibular intact bilaterally. X - Palate elevates symmetrically. XI - Chin turning & shoulder shrug intact bilaterally. XII - Tongue protrusion intact.  Motor Strength - The patient's strength was 5/5 RUE and RLE, 4/5 LUE and 4/5 LLE proximal and 2+/5 distally.  Bulk was normal and fasciculations were absent.   Motor Tone - Muscle tone was assessed at the neck and appendages and was normal.  Reflexes - The patient's reflexes were 1+ in all extremities and he had no pathological reflexes.  Sensory - Light touch, temperature/pinprick were assessed and were symmetrical.    Coordination - The patient had normal movements in the hand with no ataxia or dysmetria.  Tremor was absent.  Gait and Station - not tested due to safety concerns.   ASSESSMENT/PLAN Mr. NEWT APODACA is a 75 y.o. male with history of CAD, CHF (EF 40-45%), GIB, Cirrhosis, Hep C and hepatocellular carcinoma, thrombocytopenia and ETOH abuse presenting with L hemiparesis, L facial and dysarthria. He did not receive IV t-PA due to delay in arrival. He was taken to IR where he received TICI 3 revascularization of occluded R  MCA using solitaire, penumbra and IA integrilin.   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   CTA neck showed right M1 stenosis and right M3 occlusion  Cerebral angio - TICI3 vascularization of occluded right MCA  2D Echo - EF 40-45% on 06/16/16.   LDL 38  HgbA1c 4.5  lovenox for VTE prophylaxis Diet NPO time specified  ASA 325 mg for secondary stroke prevention  Ongoing aggressive stroke risk factor management  Therapy recommendations:  CIR. Admissions coordinator is following   Disposition:  pending   AFib w/ RVR  Hx of palpitation and SVT but diagnosed with afib RVR in  04/2016  Followed up with cardiology   Considered not anticoagulation candidate in the past due to hx of GIB, substance abuse, alcohol abuse and noncompliant with medication.  Currently not anticoagulation candidate due to large right MCA infarct  On ASA 325mg  now  On metoprolol 25 mg bid for rate control  Need close follow up with cardiology after CIR to consider anticoagulation if he is candidate at that time.  DVT  B/L DVT at LEs indeterminate age on 06/14/16  No anticoagulation treatment  Repeat LE venous doppler this time did not see clear cut DVT  Will not treat this time  Liver cirrhosis  Likely due to alcohol and hepC  CT abd/pelvis showed evidence of liver nodule  12/2014 EGD showed no esophageal varices  Albumin low at 1.8  INR 1.76 in 05/2016, now 1.25  Ammonia 102 in 05/2016, now 44  AST 252 in 04/2016, now 75  ? Liver cancer ?  CT abd/pelvis 05/2016 did not show liver mass  MRI 05/2016 did not show liver mass   Hx of GIB  12/2014 black stool  EGD showed gastritis - contribute to NSAIDs use  EGD did not show esophageal varices.  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 Covered with dressing. Healing.   Watery stools. Hx diarrhea. Neg c diff x 3 this summer, last one 8/24. On vanc and zosyn since 9/23 without source of infection. abx  discontinued after discussion with pharmacy.  Hospital Day #5  Rosalin Hawking, MD PhD Stroke Neurology 06/30/2016 7:13 PM   To contact Stroke Continuity provider, please refer to [email or web address] After hours, contact General Neurology

## 2016-06-30 NOTE — Consult Note (Signed)
   [facility] CM Inpatient Consult   06/30/2016  Nathan Frank [birth year] [id number]  Chart reviewed for recent re-admission [5th hospitalization in 6 months].  Patient is a 75 year old male w/ sig medical h/o: CAD, HF (EF 40-45%), GIB, Cirrhosis, Hep C and hepatocellular carcinoma, thrombocytopenia and ETOH abuse. Admitted to the ER on 9/21 after being found on floor w/ left sided facial droop, left sided weakness and dysarthria. Dx with right MCA infarct. He was transferred to interventional radiology where he underwent thrombectomy of the R MCA clot. He returned to the ICU on full vent support. Extubated 9/23. Notes reveal that patient and family to decide on post hospital rehab.  Currently, the plan is to pursue an inpatient rehab stay.  Will follow for progress and disposition to community needs. For questions, please contact:  Natividad Brood, RN BSN [facility] Liaison  [phone] business mobile phone Toll free office [phone]

## 2016-06-30 NOTE — Progress Notes (Signed)
Patient had 3 watery yellow stool past 24 hrs.

## 2016-06-30 NOTE — Progress Notes (Signed)
Pt sat in chair for estimated 1 hour. Then assisted back to bed.

## 2016-06-30 NOTE — Progress Notes (Signed)
Physical Therapy Treatment Patient Details Name: Nathan Frank MRN: [id number] DOB: [birth year] Today's Date: 06/30/2016    History of Present Illness 75 year old male w/ sig medical h/o: CAD, CHF (EF 40-45%), GIB, Cirrhosis, Hep C and hepatocellular carcinoma, thrombocytopenia and ETOH abuse. Admitted to the ER on 9/21 after being found on floor w/ left sided facial droop, left sided weakness and dysarthria. Dx with right MCA infarct. He was transferred to interventional radiology where he underwent thrombectomy of the R MCA clot. He returned to the ICU on full vent support. Extubated 9/23.      PT Comments    Pt progressing well towards all goals and tolerated ambulation with modAx2 via bilat HHA. Pt more alert and demo's increased activity tolerance compared to previous sessions. Acute PT to cont to follow. Pt remains to be an excellent candidate for CIR upon d/c.  Follow Up Recommendations  CIR     Equipment Recommendations  None recommended by PT    Recommendations for Other Services OT consult;Rehab consult     Precautions / Restrictions Precautions Precautions: Fall Restrictions Weight Bearing Restrictions: No    Mobility  Bed Mobility Overal bed mobility: Needs Assistance Bed Mobility: Rolling;Sidelying to Sit;Sit to Sidelying Rolling: Min assist;Max assist Sidelying to sit: Max assist       General bed mobility comments: pt minA with initiating rolling, maxA to complete task, maxA for trunk elevation to achieve sitting EOB, limited UE strength to push self up  Transfers Overall transfer level: Needs assistance Equipment used: 2 person hand held assist Transfers: Sit to/from Stand Sit to Stand: Mod assist;From elevated surface Stand pivot transfers: Mod assist       General transfer comment: PT/OT infront of pt for pt to pull up on back of therapists arms. used rocking momentum and max directional v/c's to keep head between therapist shoulders to off weight  pt's bottom  Ambulation/Gait Ambulation/Gait assistance: Mod assist;+2 physical assistance (3rd person for line management) Ambulation Distance (Feet): 30 Feet Assistive device: 2 person hand held assist Gait Pattern/deviations: Step-through pattern;Decreased step length - left;Decreased stance time - left;Decreased stride length;Shuffle Gait velocity: decreased Gait velocity interpretation: Below normal speed for age/gender General Gait Details: pt with decreased L LE step height, minimal weight shift, minimal functional use of L UE   Stairs            Wheelchair Mobility    Modified Rankin (Stroke Patients Only) Modified Rankin (Stroke Patients Only) Pre-Morbid Rankin Score: Moderate disability Modified Rankin: Severe disability     Balance Overall balance assessment: Needs assistance Sitting-balance support: No upper extremity supported;Feet supported Sitting balance-Leahy Scale: Fair     Standing balance support: Bilateral upper extremity supported Standing balance-Leahy Scale: Poor                      Cognition Arousal/Alertness: Awake/alert Behavior During Therapy: WFL for tasks assessed/performed Overall Cognitive Status: Impaired/Different from baseline Area of Impairment: Following commands;Problem solving       Following Commands: Follows multi-step commands inconsistently     Problem Solving: Slow processing;Decreased initiation;Difficulty sequencing;Requires verbal cues;Requires tactile cues      Exercises General Exercises - Lower Extremity Long Arc Quad: AROM;Both;5 reps;Seated Hip Flexion/Marching: AROM;Both;5 reps;Seated    General Comments        Pertinent Vitals/Pain Pain Assessment: Faces Faces Pain Scale: Hurts a little bit Pain Location: general grimacing with movement but did not complain Pain Intervention(s): Monitored during session  Home Living                      Prior Function            PT Goals  (current goals can now be found in the care plan section) Acute Rehab PT Goals Patient Stated Goal: get in chair Progress towards PT goals: Progressing toward goals    Frequency    Min 4X/week      PT Plan Current plan remains appropriate    Co-evaluation PT/OT/SLP Co-Evaluation/Treatment: Yes Reason for Co-Treatment: Complexity of the patient's impairments (multi-system involvement) PT goals addressed during session: Mobility/safety with mobility       End of Session Equipment Utilized During Treatment: Gait belt Activity Tolerance: Patient tolerated treatment well Patient left: in chair;with call bell/phone within reach;with chair alarm set     Time: [id number] PT Time Calculation (min) (ACUTE ONLY): 28 min  Charges:  $Gait Training: 8-22 mins                    G Codes:      Kingsley Callander 06/30/2016, 12:44 PM   Kittie Plater, PT, DPT Pager #: [phone] Office #: [phone]

## 2016-07-01 DIAGNOSIS — Z823 Family history of stroke: Secondary | ICD-10-CM

## 2016-07-01 DIAGNOSIS — T39395A Adverse effect of other nonsteroidal anti-inflammatory drugs [NSAID], initial encounter: Secondary | ICD-10-CM

## 2016-07-01 DIAGNOSIS — S81802A Unspecified open wound, left lower leg, initial encounter: Secondary | ICD-10-CM | POA: Diagnosis present

## 2016-07-01 DIAGNOSIS — R739 Hyperglycemia, unspecified: Secondary | ICD-10-CM

## 2016-07-01 DIAGNOSIS — E669 Obesity, unspecified: Secondary | ICD-10-CM | POA: Diagnosis present

## 2016-07-01 DIAGNOSIS — R197 Diarrhea, unspecified: Secondary | ICD-10-CM

## 2016-07-01 DIAGNOSIS — F1010 Alcohol abuse, uncomplicated: Secondary | ICD-10-CM

## 2016-07-01 LAB — BASIC METABOLIC PANEL
Anion gap: 9 (ref 5–15)
BUN: 16 mg/dL (ref 6–20)
CALCIUM: 8.2 mg/dL — AB (ref 8.9–10.3)
CO2: 17 mmol/L — AB (ref 22–32)
CREATININE: 1.58 mg/dL — AB (ref 0.61–1.24)
Chloride: 115 mmol/L — ABNORMAL HIGH (ref 101–111)
GFR calc non Af Amer: 41 mL/min — ABNORMAL LOW (ref >60)
GFR, EST AFRICAN AMERICAN: 48 mL/min — AB (ref >60)
Glucose, Bld: 137 mg/dL — ABNORMAL HIGH (ref 65–99)
Potassium: 2.9 mmol/L — ABNORMAL LOW (ref 3.5–5.1)
SODIUM: 141 mmol/L (ref 135–145)

## 2016-07-01 LAB — CBC
HCT: 28.5 % — ABNORMAL LOW (ref 39.0–52.0)
Hemoglobin: 9.5 g/dL — ABNORMAL LOW (ref 13.0–17.0)
MCH: 32.2 pg (ref 26.0–34.0)
MCHC: 33.3 g/dL (ref 30.0–36.0)
MCV: 96.6 fL (ref 78.0–100.0)
Platelets: 129 10*3/uL — ABNORMAL LOW (ref 150–400)
RBC: 2.95 MIL/uL — ABNORMAL LOW (ref 4.22–5.81)
RDW: 16.1 % — AB (ref 11.5–15.5)
WBC: 7.2 10*3/uL (ref 4.0–10.5)

## 2016-07-01 LAB — GLUCOSE, CAPILLARY
GLUCOSE-CAPILLARY: 141 mg/dL — AB (ref 65–99)
GLUCOSE-CAPILLARY: 126 mg/dL — AB (ref 65–99)
GLUCOSE-CAPILLARY: 116 mg/dL — AB (ref 65–99)
Glucose-Capillary: 112 mg/dL — ABNORMAL HIGH (ref 65–99)
Glucose-Capillary: 114 mg/dL — ABNORMAL HIGH (ref 65–99)

## 2016-07-01 MED ORDER — METOCLOPRAMIDE HCL 5 MG/ML IJ SOLN: 10.0000 mg | Freq: Once | INTRAMUSCULAR | Status: DC

## 2016-07-01 MED ORDER — DIPHENHYDRAMINE HCL 50 MG/ML IJ SOLN: 25.0000 mg | Freq: Once | INTRAMUSCULAR | Status: DC

## 2016-07-01 MED ORDER — KETOROLAC TROMETHAMINE 15 MG/ML IJ SOLN: 15.0000 mg | Freq: Once | INTRAMUSCULAR | Status: DC

## 2016-07-01 NOTE — Progress Notes (Signed)
STROKE TEAM PROGRESS NOTE   SUBJECTIVE (INTERVAL HISTORY) Patient up in chair at bedside. States he is tired of being up and wants to get back in the bed. He still complains of cough, phlegm in his throat, but improved from yesterday. Still with about 3 loose stools per day as per NT. Awaiting for MBS in am.   OBJECTIVE Temp:  [97.3 F (36.3 C)-98.5 F (36.9 C)] 97.7 F (36.5 C) (09/27 0929) Pulse Rate:  [102-122] 122 (09/27 0929) Cardiac Rhythm: Atrial fibrillation (09/27 0700) Resp:  [16-20] 16 (09/27 0929) BP: (113-128)/(71-89) 113/89 (09/27 0929) SpO2:  [96 %-100 %] 100 % (09/27 0929)  CBC:   Recent Labs Lab 06/28/16 0305 06/29/16 0652 06/30/16 0425 07/01/16 0725  WBC 11.3* 8.6 6.2 7.2  NEUTROABS 9.8* 6.5  --   --   HGB 10.3* 10.1* 9.8* 9.5*  HCT 31.1* 29.7* 29.4* 28.5*  MCV 99.7 97.4 97.7 96.6  PLT 117* PLATELET CLUMPS NOTED ON SMEAR, COUNT APPEARS ADEQUATE 132* 129*    Basic Metabolic Panel:   Recent Labs Lab 06/28/16 0305 06/29/16 0332 06/30/16 0425 07/01/16 0725  NA 141  140 140 140 141  K 3.9  3.9 3.9 2.9* 2.9*  CL 118*  117* 118* 116* 115*  CO2 18*  17* 14* 17* 17*  GLUCOSE 110*  110* 104* 147* 137*  BUN 8  8 11 12 16   CREATININE 0.82  0.80 0.87 0.95 1.58*  CALCIUM 7.4*  7.3* 7.8* 8.0* 8.2*  MG 1.8 2.0  --   --   PHOS 2.6  2.7 2.6  --   --     Lipid Panel:     Component Value Date/Time   CHOL 87 06/26/2016 0500   TRIG 91 06/27/2016 0219   HDL 26 (L) 06/26/2016 0500   CHOLHDL 3.3 06/26/2016 0500   VLDL 23 06/26/2016 0500   LDLCALC 38 06/26/2016 0500   HgbA1c:  Lab Results  Component Value Date   HGBA1C 4.5 (L) 06/26/2016   Urine Drug Screen:     Component Value Date/Time   LABOPIA POSITIVE (A) 06/09/2016 2059   COCAINSCRNUR NONE DETECTED 06/09/2016 2059   COCAINSCRNUR NEGATIVE 12/13/2014 2353   LABBENZ POSITIVE (A) 06/09/2016 2059   LABBENZ POSITIVE (A) 12/13/2014 2353   AMPHETMU NONE DETECTED 06/09/2016 2059   THCU NONE  DETECTED 06/09/2016 2059   LABBARB NONE DETECTED 06/09/2016 2059      IMAGING I have personally reviewed the radiological images below and agree with the radiology interpretations.  MRI brain without contrast 06/26/2016 1. Acute moderately large nonhemorrhagic right MCA infarct. 2. Chronic right frontal left parietal infarcts. 3. Small chronic extra-axial collection or dural thickening in the anterior left frontal region.  CTA Head and Neck 06/28/2016 CT HEAD:  Evolving large RIGHT MCA territory nonhemorrhagic infarct. Old RIGHT frontal and LEFT parietal lobe infarcts. LEFT frontal dural thickening versus chronic subdural hematoma. 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 Small bilateral pleural effusions. Sub cm probable bone islands T4 and T5 though, if there is a history of cancer, recommend bone scan. ( History of hepato-cellular carcinoma) CTA HEAD:  Occluded RIGHT M3 segment. Mild stenosis distal RIGHT M1 segment. Mild luminal irregularity of the RIGHT M2 segment may be due to extrinsic deformity, atherosclerosis or vasospasm. No large vessel occlusion.   Ct Head Code Stroke Wo Contrast 06/25/2016 1. Evolving moderate-sized acute right MCA infarct. No evidence of acute intracranial hemorrhage.  2.  Chronic ischemic changes as above. 3. Small volume extra-axial material over the anterior left frontal lobe, present since 2014. This may reflect dural thickening nor a chronic, complex subdural collection. No mass effect.   Cerebral Angio 06/25/2016 S/P Rt common carotid arteriogram,followed by complete revascularization of occluded RT MCA prox with x 3 passes with solitaire 4 mm x 40 mm FR retrieval device ,and ACE penumbra aspiration device, and 7.2 mg of IA integrelin superselectively achieving a TICI 3 reperfusion  Ct Head Wo Contrast post IR 06/25/2016 Dense right MCA with generalized decreased attenuation  involving the right parietal and temporal lobes consistent with acute ischemia. Areas of subacute to chronic ischemia as described. No acute hemorrhage is noted.   TTE 06/16/16  - Left ventricle: Posterior , septal and apical hypokinesis The cavity size was mildly dilated. Wall thickness was normal. Systolic function was mildly to moderately reduced. The estimated ejection fraction was in the range of 40% to 45%. Left ventricular diastolic function parameters were normal. - Mitral valve: There was mild regurgitation. - Atrial septum: No defect or patent foramen ovale was identified.  LE venous doppler - The popliteal vein in the right lower extremity appears to be non-compressible of an unknown etiology. However, color fill and doppler signals are normal. Deep vein thrombosis of the right lower extremity can not be ruled out. There is no evidence of superficial vein thrombosis involving the right lower extremity. There is no evidence of deep or superficial vein thrombosis involving the left lower extremity. -  No evidence of Baker&'s cyst on the right or left.  PHYSICAL EXAM  Temp:  [97.3 F (36.3 C)-98.5 F (36.9 C)] 97.7 F (36.5 C) (09/27 0929) Pulse Rate:  [102-122] 122 (09/27 0929) Resp:  [16-20] 16 (09/27 0929) BP: (113-128)/(71-89) 113/89 (09/27 0929) SpO2:  [96 %-100 %] 100 % (09/27 0929)  General - Well nourished, well developed, not in acute distress.  Ophthalmologic - Fundi not visualized due to noncooperation.  Cardiovascular - Regular rate and rhythm.  Mental Status -  Level of arousal and orientation to time, place, and person were intact. Language including expression, naming, repetition, comprehension was assessed and found intact, moderate dysarthria.  Cranial Nerves II - XII - II - Visual field intact OU. III, IV, VI - Extraocular movements intact. V - Facial sensation intact bilaterally. VII - Facial movement intact bilaterally. VIII - Hearing & vestibular  intact bilaterally. X - Palate elevates symmetrically. XI - Chin turning & shoulder shrug intact bilaterally. XII - Tongue protrusion intact.  Motor Strength - The patient's strength was 5/5 RUE and RLE, 4/5 LUE and 4/5 LLE proximal and 2+/5 distally.  Bulk was normal and fasciculations were absent.   Motor Tone - Muscle tone was assessed at the neck and appendages and was normal.  Reflexes - The patient's reflexes were 1+ in all extremities and he had no pathological reflexes.  Sensory - Light touch, temperature/pinprick were assessed and were symmetrical.    Coordination - The patient had normal movements in the hand with no ataxia or dysmetria.  Tremor was absent.  Gait and Station - not tested due to safety concerns.   ASSESSMENT/PLAN Nathan Frank is a 75 y.o. male with history of CAD, CHF (EF 40-45%), GIB, Cirrhosis, Hep C and hepatocellular carcinoma, thrombocytopenia and ETOH abuse presenting with L hemiparesis, L facial and dysarthria. He did not receive IV t-PA due to delay in arrival. He was taken to IR where he received TICI 3  revascularization of occluded R MCA using solitaire, penumbra and IA integrilin.   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   CTA neck showed right M1 stenosis and right M3 occlusion  Cerebral angio - TICI3 vascularization of occluded right MCA  2D Echo - EF 40-45% on 06/16/16.   LDL 38  HgbA1c 4.5  lovenox for VTE prophylaxis Diet NPO time specified  ASA 325 mg for secondary stroke prevention  Ongoing aggressive stroke risk factor management  Therapy recommendations:  CIR. Admissions coordinator is following, recommend SNF   Disposition:  Pending. Medically ready for transfer   AFib w/ RVR  Hx of palpitation and SVT but diagnosed with  afib RVR in 04/2016  Followed up with cardiology   Considered not anticoagulation candidate in the past due to hx of GIB, substance abuse, alcohol abuse and noncompliant with medication.  Currently not anticoagulation candidate due to large right MCA infarct  On ASA 325mg  now  On metoprolol 25 mg bid for rate control  Need close follow up with cardiology after CIR to consider anticoagulation if he is candidate at that time. (has seen Dr. Lovena Le and Truitt Merle in the past)  DVT  B/L DVT at LEs indeterminate age on 06/14/16  No anticoagulation treatment  Repeat LE venous doppler this time did not see clear cut DVT  Will not treat this time  Liver cirrhosis  Likely due to alcohol and hepC  CT abd/pelvis showed evidence of liver nodule  12/2014 EGD showed no esophageal varices  Albumin low at 1.8  INR 1.76 in 05/2016, now 1.25  Ammonia 102 in 05/2016, now 44  AST 252 in 04/2016, now 75  ? Liver cancer ?  CT abd/pelvis 05/2016 did not show liver mass  MRI 05/2016 did not show liver mass   Hx of GIB  12/2014 black stool  EGD showed gastritis - contribute to NSAIDs use  EGD did not show esophageal varices.  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 Covered with dressing. Healing.   Right inguinal angio site unhealing wound - IR on board -  daily dressing change  Hospital Day #6  Rosalin Hawking, MD PhD Stroke Neurology 07/01/2016 9:37 AM   To contact Stroke Continuity provider, please refer to [email or web address] After hours, contact General Neurology

## 2016-07-01 NOTE — Progress Notes (Signed)
Speech Language Pathology Treatment: Dysphagia  Patient Details Name: Nathan Frank MRN: [id number] DOB: Jan 07, 1941 Today's Date: 07/01/2016 Time: [id number] SLP Time Calculation (min) (ACUTE ONLY): 13 min  Assessment / Plan / Recommendation Clinical Impression  Pt presents today with improved clarity of speech, improved quality and volume of voice.  Consumed trials of purees with fewer clinical signs and symptoms of dysphagia and aspiration. Min verbal cues for cough/throat clear s/p consumption. Pt eager to begin eating.  He is ready for MBS - will plan for 9/28.  Anticipate he will be able to start at least a modified PO diet.    HPI HPI: 75 year old male w/ sig medical h/o: CAD, CHF (EF 40-45%), GIB, Cirrhosis, Hep C and hepatocellular carcinoma, thrombocytopenia and ETOH abuse. Admitted to the ER on 9/21 after being found on floor w/ left sided facial droop, left sided weakness and dysarthria. Dx with right MCA infarct. He was transferred to interventional radiology where he underwent thrombectomy of the R MCA clot. He returned to the ICU on full vent support. Extubated 9/23.       SLP Plan  Continue with current plan of care;MBS     Recommendations  Diet recommendations: NPO Medication Administration: Via alternative means                Oral Care Recommendations: Oral care QID Plan: Continue with current plan of care;MBS       GO                Nathan Frank 07/01/2016, 2:35 PM

## 2016-07-01 NOTE — Discharge Summary (Signed)
Stroke Discharge Summary  Patient ID: Nathan Frank   MRN: [id number]      DOB: 1941/01/21  Date of Admission: 06/25/2016 Date of Discharge: 07/04/2016  Attending Physician:  Rosalin Hawking, MD, Stroke MD Consulting Physician(s):  Treatment Team:  [facility] [place], MD [name], MD (cardiology), Simonne Maffucci, MD (pulmonary/intensive care), Delice Lesch, MD (Physical Medicine & Rehabtilitation)  Patient's PCP:  Thressa Sheller, MD  Discharge Diagnoses:  Right large middle cerebral artery infarct. Active Problems:   Cerebral infarction due to embolism of right middle cerebral artery (HCC) - s/p mechanical thrombectomy   Paroxysmal atrial fibrillation with RVR (HCC)   Diarrhea, chronic   AKI due to dehydration   Anemia of chronic disease   Alcohol abuse   Liver cirrhosis (HCC)   DVT of lower limb, indeterminate age   GI bleed due to NSAIDs   Essential hypertension   Dysphagia, post-stroke   Dysarthria, post-stroke   Polysubstance abuse   Acute on chronic combined systolic and diastolic heart failure (HCC)     BMI  Body mass index is 31.04 kg/m.  Past Medical History:  Diagnosis Date  . Abnormal TSH   . Acute gastric ulcer   . Acute gastritis with hemorrhage   . Alcohol abuse   . Alcohol dependence ([place]) 02/02/2014  . Anemia   . CHF (congestive heart failure) ([place])   . Cirrhosis ([place]) 06/04/2012  . Depressive disorder 02/01/2014  . Dizziness and giddiness 12/13/2014  . DVT of lower limb, acute ([place]) 06/06/2012  . Essential hypertension   . Fatty liver   . Gallstones   . Gastritis 06/07/2012  . GERD (gastroesophageal reflux disease)   . GI bleed due to NSAIDs 12/13/2014  . Granulomatous gastritis   . Hematuria 06/04/2012  . Hepatitis C   . Hepatocellular carcinoma ([place])   . Heroin abuse    "I haven't done that since I don't know when."  . Heroin overdose 02/20/2014  . LV dysfunction    a. 04/2015: EF 45-50% by cath.  . Neuropathy ([place])   . NSTEMI (non-ST  elevated myocardial infarction) ([place])    a. 04/2015 - patent coronaries. Etiology possibly due to coronary spasm versus embolus, stress cardiomyopathy (atypical), and aborted infarction related to plaque rupture with thrombosis and dissolution. Amlodipine started. Not on antiplatelets due to GIB/cirrhosis history.  . Oral thrush 06/05/2012  . Polysubstance abuse    Rare marijuana.  No EtOH x 2 months.   . Prolonged Q-T interval on ECG    a. 12/2014 - treated with magnesium.  . Right knee pain 12/13/2014  . S/P alcohol detoxification 02/02/2014  . SVT (supraventricular tachycardia) ([place])    a. 12/2014 in setting of GIB, ETOH, NSAIDS, gastritis.  . Symptomatic cholelithiasis 12/15/2013  . Thrombocytopenia ([place])   . Upper GI bleeding 12/13/2014  . Weight loss 06/04/2012   Past Surgical History:  Procedure Laterality Date  . CARDIAC CATHETERIZATION N/A 04/15/2015   Procedure: Left Heart Cath and Coronary Angiography;  Surgeon: Belva Crome, MD;  Location: [facility];  Service: Cardiovascular;  Laterality: N/A;  . CIRCUMCISION    . ESOPHAGOGASTRODUODENOSCOPY  06/07/2012   Procedure: ESOPHAGOGASTRODUODENOSCOPY (EGD);  Surgeon: Milus Banister, MD;  Location: [facility];  Service: Endoscopy;  Laterality: N/A;  may need treatment of varices  . ESOPHAGOGASTRODUODENOSCOPY N/A 12/14/2014   Procedure: ESOPHAGOGASTRODUODENOSCOPY (EGD);  Surgeon: Jerene Bears, MD;  Location: [facility];  Service: Endoscopy;  Laterality: N/A;  . IR  GENERIC HISTORICAL  06/25/2016   IR US GUIDE VASC ACCESS LEFT 06/25/2016 Corrie Mckusick, DO [facility]  . IR GENERIC HISTORICAL  06/25/2016   IR RADIOLOGY PERIPHERAL GUIDED IV START 06/25/2016 Corrie Mckusick, DO [facility]  . IR GENERIC HISTORICAL  06/25/2016   IR PERCUTANEOUS ART THROMBECTOMY/INFUSION INTRACRANIAL INC DIAG ANGIO 06/25/2016 Luanne Bras, MD [facility]  . RADIOLOGY WITH ANESTHESIA N/A 06/25/2016   Procedure: RADIOLOGY WITH ANESTHESIA;  Surgeon: Luanne Bras, MD;  Location: [facility];  Service: Radiology;  Laterality: N/A;  . TONSILLECTOMY      Medications to be continued on Rehab . aspirin  325 mg Per Tube Daily  . chlorhexidine  15 mL Mouth Rinse BID  . enoxaparin (LOVENOX) injection  40 mg Subcutaneous Q24H  . levothyroxine  50 mcg Per Tube Q24H  . mouth rinse  15 mL Mouth Rinse TID PC & HS  . metoprolol tartrate  50 mg Oral BID  . multivitamin with minerals  1 tablet Oral Daily  . potassium chloride  20 mEq Oral BID  . sucralfate  1 g Oral Q6H  . thiamine  100 mg Per Tube Daily    LABORATORY STUDIES CBC    Component Value Date/Time   WBC 6.9 07/04/2016 0649   RBC 2.95 (L) 07/04/2016 0649   HGB 9.5 (L) 07/04/2016 0649   HCT 28.7 (L) 07/04/2016 0649   PLT 174 07/04/2016 0649   MCV 97.3 07/04/2016 0649   MCH 32.2 07/04/2016 0649   MCHC 33.1 07/04/2016 0649   RDW 17.4 (H) 07/04/2016 0649   LYMPHSABS 0.9 06/29/2016 0652   MONOABS 1.1 (H) 06/29/2016 0652   EOSABS 0.1 06/29/2016 0652   BASOSABS 0.0 06/29/2016 0652   CMP    Component Value Date/Time   NA 142 07/04/2016 0649   K 3.1 (L) 07/04/2016 0649   CL 119 (H) 07/04/2016 0649   CO2 16 (L) 07/04/2016 0649   GLUCOSE 95 07/04/2016 0649   BUN 21 (H) 07/04/2016 0649   CREATININE 1.68 (H) 07/04/2016 0649   CREATININE 0.72 12/13/2015 1226   CALCIUM 8.3 (L) 07/04/2016 0649   PROT 7.1 06/25/2016 0923   ALBUMIN 1.8 (L) 06/29/2016 0332   AST 75 (H) 06/25/2016 0923   ALT 39 06/25/2016 0923   ALKPHOS 78 06/25/2016 0923   BILITOT 1.0 06/25/2016 0923   GFRNONAA 38 (L) 07/04/2016 0649   GFRAA 44 (L) 07/04/2016 0649   COAGS Lab Results  Component Value Date   INR 1.25 06/25/2016   INR 1.46 06/10/2016   INR 1.34 06/09/2016   Lipid Panel    Component Value Date/Time   CHOL 87 06/26/2016 0500   TRIG 91 06/27/2016 0219   HDL 26 (L) 06/26/2016 0500   CHOLHDL 3.3 06/26/2016 0500   VLDL 23 06/26/2016 0500   LDLCALC 38 06/26/2016 0500   HgbA1C  Lab Results   Component Value Date   HGBA1C 4.5 (L) 06/26/2016   Urinalysis    Component Value Date/Time   COLORURINE RED (A) 06/27/2016 2237   APPEARANCEUR CLOUDY (A) 06/27/2016 2237   LABSPEC >1.030 (H) 06/27/2016 2237   PHURINE 5.0 06/27/2016 2237   GLUCOSEU 100 (A) 06/27/2016 2237   HGBUR LARGE (A) 06/27/2016 2237   BILIRUBINUR NEGATIVE 06/27/2016 2237   KETONESUR 15 (A) 06/27/2016 2237   PROTEINUR 100 (A) 06/27/2016 2237   UROBILINOGEN 2.0 (H) 12/13/2014 1256   NITRITE POSITIVE (A) 06/27/2016 2237   LEUKOCYTESUR TRACE (A) 06/27/2016 2237   Urine Drug Screen  Component Value Date/Time   LABOPIA POSITIVE (A) 06/09/2016 2059   COCAINSCRNUR NONE DETECTED 06/09/2016 2059   COCAINSCRNUR NEGATIVE 12/13/2014 2353   LABBENZ POSITIVE (A) 06/09/2016 2059   LABBENZ POSITIVE (A) 12/13/2014 2353   AMPHETMU NONE DETECTED 06/09/2016 2059   THCU NONE DETECTED 06/09/2016 2059   LABBARB NONE DETECTED 06/09/2016 2059    Alcohol Level    Component Value Date/Time   ETH <5 06/09/2016 1705     SIGNIFICANT DIAGNOSTIC STUDIES I have personally reviewed the radiological images below and agree with the radiology interpretations.  MRI brain without contrast 06/26/2016 1. Acute moderately large nonhemorrhagic right MCA infarct. 2. Chronic right frontal left parietal infarcts. 3. Small chronic extra-axial collection or dural thickening in the anterior left frontal region.  CT HEAD 06/28/2016:  Evolving large RIGHT MCA territory nonhemorrhagic infarct. Old RIGHT frontal and LEFT parietal lobe infarcts. LEFT frontal dural thickening versus chronic subdural hematoma.  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 Small bilateral pleural effusions. Sub cm probable bone islands T4 and T5 though, if there is a history of cancer, recommend bone scan. ( History of hepato-cellular carcinoma)  CTA HEAD:  Occluded RIGHT M3 segment. Mild  stenosis distal RIGHT M1 segment. Mild luminal irregularity of the RIGHT M2 segment may be due to extrinsic deformity, atherosclerosis or vasospasm. No large vessel occlusion.   Ct Head Code Stroke Wo Contrast 06/25/2016 1. Evolving moderate-sized acute right MCA infarct. No evidence of acute intracranial hemorrhage.  2. Chronic ischemic changes as above. 3. Small volume extra-axial material over the anterior left frontal lobe, present since 2014. This may reflect dural thickening nor a chronic, complex subdural collection. No mass effect.   Cerebral Angio 06/25/2016 S/P Rt common carotid arteriogram,followed by complete revascularization of occluded RT MCA prox with x 3 passes with solitaire 4 mm x 40 mm FR retrieval device ,and ACE penumbra aspiration device, and 7.2 mg of IA integrelin superselectively achieving a TICI 3 reperfusion  Ct Head Wo Contrast post IR 06/25/2016 Dense right MCA with generalized decreased attenuation involving the right parietal and temporal lobes consistent with acute ischemia. Areas of subacute to chronic ischemia as described. No acute hemorrhage is noted.   TTE 06/16/16  - Left ventricle: Posterior , septal and apical hypokinesis The cavity size was mildly dilated. Wall thickness was normal. Systolic function was mildly to moderately reduced. The estimatedejection fraction was in the range of 40% to 45%. Leftventricular diastolic function parameters were normal. - Mitral valve: There was mild regurgitation. - Atrial septum: No defect or patent foramen ovale was identified.  LE venous doppler - The popliteal vein in the right lower extremity appears to benon-compressible of an unknown etiology. However, color fill anddoppler signals are normal. Deep vein thrombosis of the rightlower extremity can not be ruled out. There is no evidence ofsuperficial vein thrombosis involving the right lower extremity.There is no evidence of deep or superficial vein  thrombosisinvolving the left lower extremity. -  No evidence of Baker&'s cyst on the right or left.     HISTORY OF PRESENT ILLNESS Nathan Thornhill Tatumis an 75 y.o.malewith hx of CAD, CHF (EF 40-45%), GIB, Cirrhosis, Hep C and hepatocellular carcinoma, thrombocytopenia and ETOH abuse presents after falling in the middle of the night and having L sided weakness and L facial droop along with dysarthria. He was last known well 06/24/16 at 11pm. Patient was not administered IV t-PA secondary to delay in arrival.  CT perfusion showed salvageable defect. He was taken to IR where he received TICI 3 revascularization of occluded R MCA using solitaire, penumbra and IA integrilin He was admitted to the neuro ICU for further evaluation and treatment.    HOSPITAL COURSE Nathan Frank is a 75 y.o. male with history of CAD, CHF (EF 40-45%), GIB, Cirrhosis, Hep C and hepatocellular carcinoma, thrombocytopenia and ETOH abuse presenting with L hemiparesis, L facial and dysarthria. He did not receive IV t-PA due to delay in arrival. He was taken to IR where he received TICI 3 revascularization of occluded R MCA using solitaire, penumbra and IA integrilin. Large R MCA infarct was embolic secondary to known atrial fibrillation not on AC due to hx GIB, substance abuse and noncompliance. Post strokedysphagia requiring feeding tube and frequent suctioning d/t mucous production.   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   CTA neck showed right M1 stenosis and right M3 occlusion  Cerebral angio - TICI3 vascularization of occluded right MCA  2D Echo - EF 40-45% on 06/16/16  LDL 38  HgbA1c 4.5  lovenox for VTE prophylaxis  DIET DYS 3 Room service appropriate? Yes; Fluid consistency: Thin.   ASA 325 mg for  secondary stroke prevention  Ongoing aggressive stroke risk factor management  Therapy recommendations:  CIR.   Disposition: CIR today  AFib w/ RVR  Hx of palpitation and SVT but diagnosed with afib RVR in 04/2016  Followed up with cardiology Dr. Harrington Challenger  Considered not anticoagulation candidate in the past due to hx of GIB, substance abuse, alcohol abuse and noncompliant with medication.  Currently not anticoagulation candidate due to large right MCA infarct  On ASA 325mg  now  Continued to have afib RVR intermittently  Increase metoprolol to 50 mg bid for rate control  Need to follow up with Dr. Harrington Challenger after CIR to consider possibility of anticoagulation given embolic stroke most likely due to afib not on [facility]  CHADS2-VASc = 6 and HASBLED = 4  DVT  B/L DVT at LEs indeterminate age on 06/14/16  No anticoagulation treatment  Repeat LE venous doppler this time did not see clear cut DVT  Will not treat this time  Liver cirrhosis  Likely due to alcohol and hepC  CT abd/pelvis showed evidence of liver nodule  12/2014 EGD showed no esophageal varices  Albumin low at 1.8  INR 1.76 in 05/2016, now 1.25  Ammonia 102 in 05/2016, now 44  AST 252 in 04/2016, now 75  ? Liver cancer ?  CT abd/pelvis 05/2016 did not show liver mass  MRI 05/2016 did not show liver mass   Hx of GIB  12/2014 black stool  EGD showed gastritis - contribute to NSAIDs use  EGD did not show esophageal varices.  AKI  Likely due to poor intake and diarrhea  Cre 0.95-1.58 - 1.78 - 1.72 -> 1.68  On IV fluid  Anemia of chronic disease  Hb 10.1-> 9.5-> 9.2-> 8.5 ->9.5  Iron 34, ferritin 499  Hypertension  Stable  Long-term BP goal normotensive  On metoprolol 50 twice a day  Other Stroke Risk Factors  Advanced age  Former Cigarette smoker  ETOH abuse, advised to drink no more than 2 drink(s) a day  Hx heroin use with OD, not used recently per chart  Overweight, Body mass  index is 31.04 kg/m., recommend weight loss, diet and exercise as appropriate   Family hx stroke (father)  Coronary artery disease  CHF EF 40-45%  Other Active Problems  Hyperglycemia without hx diabetes 137-147  LLE superficial abrasion. Covered with dressing. Healing.   Right inguinal angio site unhealing wound - IR on board - daily dressing change  Hypokalemia - supplement and recheck  Renal insufficiency    DISCHARGE EXAM Blood pressure 125/90, pulse 79, temperature 97.7 F (36.5 C), temperature source Oral, resp. rate 16, height 6' (1.829 m), weight 103.8 kg (228 lb 14.4 oz), SpO2 100 %.  General - Well nourished, well developed, not in acute distress.  Ophthalmologic - Fundi not visualized due to noncooperation.  Cardiovascular - Regular rate and rhythm.  Mental Status -  Level of arousal and orientation to time, place, and person were intact. Language including expression, naming, repetition, comprehension was assessed and found intact, moderate dysarthria.  Cranial Nerves II - XII - II - Visual field intact OU. III, IV, VI - Extraocular movements intact. V - Facial sensation intact bilaterally. VII - Facial movement intact bilaterally. VIII - Hearing & vestibular intact bilaterally. X - Palate elevates symmetrically. XI - Chin turning & shoulder shrug intact bilaterally. XII - Tongue protrusion intact.  Motor Strength - The patient's strength was 5/5 RUE and RLE, 4/5 LUE and 4/5 LLE proximal and 2+/5 distally.  Bulk was normal and fasciculations were absent.   Motor Tone - Muscle tone was assessed at the neck and appendages and was normal.  Reflexes - The patient's reflexes were 1+ in all extremities and he had no pathological reflexes.  Sensory - Light touch, temperature/pinprick were assessed and were symmetrical.    Coordination - The patient had normal movements in the hands with no ataxia or dysmetria.  Tremor was absent.  Gait and  Station - not tested due to safety concerns.   Discharge Diet  DIET DYS 3 Room service appropriate? Yes; Fluid consistency: Thin liquids  DISCHARGE PLAN  Disposition:  Transfer to [facility] for ongoing PT, OT and ST  aspirin 325 mg daily for secondary stroke prevention.  Recommend ongoing risk factor control by Primary Care Physician at time of discharge from inpatient rehabilitation.  Follow-up MACKENZIE,BRIAN, MD in 2 weeks following discharge from rehab.  Follow-up with Dr. Rosalin Hawking, Stroke Clinic in 6 weeks, office to schedule an appointment.   Follow up with Dr. Harrington Challenger, cardiology 1-2 weeks after discharge from CIR to discuss about anticoagulation.  45 minutes were spent preparing discharge.  Rosalin Hawking, MD PhD Stroke Neurology 07/04/2016 5:09 PM

## 2016-07-01 NOTE — Progress Notes (Addendum)
rn and tech both worked yesterday and did not notice this open wound yesterday on 9/27  Pt has open wound to right groin. Assumed from previous procedures. Not actively bleedly. But blood on wash cloth after cleaning.  Band aid applied and md made aware.    IR made aware as well.

## 2016-07-01 NOTE — Progress Notes (Signed)
Physical Therapy Treatment Patient Details Name: Nathan Frank MRN: [id number] DOB: [birth year] Today's Date: 07/01/2016    History of Present Illness 75 year old male w/ sig medical h/o: CAD, CHF (EF 40-45%), GIB, Cirrhosis, Hep C and hepatocellular carcinoma, thrombocytopenia and ETOH abuse. Admitted to the ER on 9/21 after being found on floor w/ left sided facial droop, left sided weakness and dysarthria. Dx with right MCA infarct. He was transferred to interventional radiology where he underwent thrombectomy of the R MCA clot. He returned to the ICU on full vent support. Extubated 9/23.      PT Comments    Pt very motivated to participate in therapy. Continue progress per POC.  Follow Up Recommendations  CIR     Equipment Recommendations  None recommended by PT    Recommendations for Other Services       Precautions / Restrictions Precautions Precautions: Fall    Mobility  Bed Mobility     Rolling: Min assist Sidelying to sit: Max assist       General bed mobility comments: min assist with roll to left. Max assist to progress BLE off bed and to elevate trunk from sidelying position.  Transfers   Equipment used: 2 person hand held assist   Sit to Stand: Mod assist Stand pivot transfers: Mod assist;+2 physical assistance       General transfer comment: Pt retropulsive with stance. Cueing/assist with anterior weight shift. Verbal cues for hand placement, posture, and sequencing.  Ambulation/Gait Ambulation/Gait assistance: +2 physical assistance;Mod assist Ambulation Distance (Feet): 5 Feet Assistive device: 2 person hand held assist Gait Pattern/deviations: Step-through pattern;Decreased step length - left;Decreased weight shift to left Gait velocity: decreased Gait velocity interpretation: Below normal speed for age/gender     Stairs            Wheelchair Mobility    Modified Rankin (Stroke Patients Only) Modified Rankin (Stroke Patients  Only) Pre-Morbid Rankin Score: Moderate disability Modified Rankin: Severe disability     Balance   Sitting-balance support: Bilateral upper extremity supported;Feet supported Sitting balance-Leahy Scale: Fair     Standing balance support: Bilateral upper extremity supported;During functional activity Standing balance-Leahy Scale: Poor                      Cognition Arousal/Alertness: Awake/alert Behavior During Therapy: WFL for tasks assessed/performed Overall Cognitive Status: Impaired/Different from baseline Area of Impairment: Awareness;Memory;Following commands;Safety/judgement;Problem solving     Memory: Decreased short-term memory Following Commands: Follows multi-step commands inconsistently Safety/Judgement: Decreased awareness of safety;Decreased awareness of deficits Awareness: Emergent Problem Solving: Slow processing;Difficulty sequencing;Requires verbal cues;Decreased initiation      Exercises      General Comments        Pertinent Vitals/Pain Pain Assessment: No/denies pain    Home Living                      Prior Function            PT Goals (current goals can now be found in the care plan section) Acute Rehab PT Goals Patient Stated Goal: get in chair PT Goal Formulation: With patient Time For Goal Achievement: 07/05/16 Potential to Achieve Goals: Good Progress towards PT goals: Progressing toward goals    Frequency    Min 4X/week      PT Plan Current plan remains appropriate    Co-evaluation             End of Session Equipment Utilized  During Treatment: Gait belt;Oxygen Activity Tolerance: Patient tolerated treatment well Patient left: in chair;with chair alarm set;with call bell/phone within reach     Time: [id number] PT Time Calculation (min) (ACUTE ONLY): 24 min  Charges:  $Therapeutic Activity: 23-37 mins                    G Codes:      Lorriane Shire 07/01/2016, 9:56 AM

## 2016-07-01 NOTE — Progress Notes (Signed)
Patient ID: Nathan Frank, male   DOB: 1941-08-17, 75 y.o.   MRN: [id number]    Referring Physician(s): Dr. Jaynee Eagles  Supervising Physician: Luanne Bras  Patient Status: inpt  Chief Complaint: CVA  Subjective: Patient is s/p (R) MCA revascularization.  We have been asked to see him today because his right inguinal site has developed into an open wound.  Allergies: Review of patient's allergies indicates no known allergies.  Medications: Prior to Admission medications   Medication Sig Start Date End Date Taking? Authorizing Provider  aspirin EC 325 MG tablet Take 1 tablet (325 mg total) by mouth daily. 06/16/16  Yes Florencia Reasons, MD  Cholecalciferol (VITAMIN D PO) Take 1 tablet by mouth daily.   Yes Historical Provider, MD  folic acid (FOLVITE) 1 MG tablet Take 1 tablet (1 mg total) by mouth daily. 04/21/16  Yes Maryann Mikhail, DO  furosemide (LASIX) 40 MG tablet Take 1 tablet (40 mg total) by mouth daily. 06/16/16  Yes Florencia Reasons, MD  levothyroxine (SYNTHROID, LEVOTHROID) 50 MCG tablet Take 1 tablet (50 mcg total) by mouth daily before breakfast. 06/16/16  Yes Florencia Reasons, MD  magnesium oxide (MAG-OX) 400 (241.3 Mg) MG tablet Take 1 tablet (400 mg total) by mouth daily. 06/03/16  Yes Verlee Monte, MD  metoprolol tartrate (LOPRESSOR) 25 MG tablet Take 1 tablet (25 mg total) by mouth 3 (three) times daily. 06/16/16  Yes Florencia Reasons, MD  Multiple Vitamin (MULTIVITAMIN WITH MINERALS) TABS tablet Take 1 tablet by mouth daily. 04/21/16  Yes Maryann Mikhail, DO  potassium chloride (K-DUR) 10 MEQ tablet Take 2 tablets (20 mEq total) by mouth daily. 06/16/16  Yes Florencia Reasons, MD  spironolactone (ALDACTONE) 25 MG tablet Take 0.5 tablets (12.5 mg total) by mouth daily. 06/16/16  Yes Florencia Reasons, MD  sucralfate (CARAFATE) 1 g tablet Take 1 tablet (1 g total) by mouth 2 (two) times daily. 06/16/16  Yes Florencia Reasons, MD  zolpidem (AMBIEN) 5 MG tablet Take 1 tablet (5 mg total) by mouth at bedtime as needed for sleep. 06/15/16  Yes  Florencia Reasons, MD  lipase/protease/amylase (CREON) 12000 units CPEP capsule Take 1 capsule (12,000 Units total) by mouth 3 (three) times daily with meals. Patient not taking: Reported on 06/25/2016 06/16/16   Florencia Reasons, MD  saccharomyces boulardii (FLORASTOR) 250 MG capsule Take 1 capsule (250 mg total) by mouth 2 (two) times daily. Patient not taking: Reported on 06/25/2016 06/03/16   Verlee Monte, MD  thiamine 100 MG tablet Take 1 tablet (100 mg total) by mouth daily. Patient not taking: Reported on 05/27/2016 04/21/16   Cristal Ford, DO    Vital Signs: BP 113/89 (BP Location: Left Arm)   Pulse (!) 122   Temp 97.7 F (36.5 C) (Oral)   Resp 16   Ht 6' (1.829 m)   Wt 228 lb 14.4 oz (103.8 kg)   SpO2 99%   BMI 31.04 kg/m   Physical Exam: Skin: right inguinal site reveals an open wound measuring 1x1x0.5cm.  There is no bleeding, hematoma, or pseudoaneurysm noted.  The skin around this site is macerated as well.  Imaging: Ct Angio Head W Or Wo Contrast  Result Date: 06/28/2016 CLINICAL DATA:  Stroke, followup evaluation. Status post revascularization of occluded RIGHT middle cerebral artery. EXAM: CT ANGIOGRAPHY HEAD AND NECK TECHNIQUE: Multidetector CT imaging of the head and neck was performed using the standard protocol during bolus administration of intravenous contrast. Multiplanar CT image reconstructions and MIPs were obtained to evaluate the  vascular anatomy. Carotid stenosis measurements (when applicable) are obtained utilizing NASCET criteria, using the distal internal carotid diameter as the denominator. CONTRAST:  50 cc Isovue 370 COMPARISON:  MRI head June 26, 2016 and FINDINGS: CT HEAD BRAIN: Blurring of the RIGHT frontotemporal parietal gray-white matter differentiation, with patchy RIGHT basal ganglia and corona radiata hypodensity corresponding to known acute infarct. Local sulcal effacement without midline shift. No intraparenchymal hemorrhage. RIGHT frontal and LEFT parietal lobe  encephalomalacia. Mild ventriculomegaly, with partially effaced RIGHT temporal horn, no hydrocephalus. No significant extra-axial fluid collections. Minimal LEFT frontal dural thickening or chronic subdural hematoma. Minimally effaced RIGHT basal cisterns. VASCULAR: Mild calcific atherosclerosis of the carotid siphons. SKULL: No skull fracture. Old bilateral distal nasal bone fractures. No significant scalp soft tissue swelling. SINUSES/ORBITS: The mastoid air-cells and included paranasal sinuses are well-aerated.The included ocular globes and orbital contents are non-suspicious. OTHER: None. CTA NECK AORTIC ARCH: Normal appearance of the thoracic arch, normal branch pattern. Mild calcific atherosclerosis. The origins of the innominate, left Common carotid artery and subclavian artery are widely patent. RIGHT CAROTID SYSTEM: Common carotid artery is widely patent, coursing in a straight line fashion. Mild eccentric calcific atherosclerosis of RIGHT internal carotid artery origin. Normal appearance of the carotid bifurcation without hemodynamically significant stenosis by NASCET criteria. 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 No intramural hematoma. LEFT CAROTID SYSTEM: Common carotid artery is widely patent, coursing in a straight line fashion. Normal appearance of the carotid bifurcation without hemodynamically significant stenosis by NASCET criteria. Normal appearance of the included internal carotid artery. VERTEBRAL ARTERIES:Mild stenosis RIGHT vertebral artery origin. Codominant vertebral arteries. Vertebral arteries are patent throughout the course. Mild extrinsic deformity due to degenerative cervical spine. SKELETON: No acute osseous process though bone windows have not been submitted. Poor dentition. Subcentimeter sclerotic foci T4 and T5 vertebral bodies . OTHER NECK: Soft tissues of the neck are non-acute though, not tailored  for evaluation. Lobulated RIGHT pleural effusion extending into the fissure. Small layering LEFT pleural effusion. CTA HEAD ANTERIOR CIRCULATION: Normal appearance of the cervical internal carotid arteries, petrous, cavernous and supra clinoid internal carotid arteries. Widely patent anterior communicating artery. RIGHT A1 segment is dominant. Patent bilateral anterior cerebral arteries. Bilateral middle cerebral arteries are patent. Mild stenosis distal RIGHT M1. Mild luminal irregularity RIGHT M2 segment. Occluded distal RIGHT M3 segment. No large vessel occlusion, hemodynamically significant stenosis, dissection, contrast extravasation or aneurysm. POSTERIOR CIRCULATION: Normal appearance of the vertebral arteries, vertebrobasilar junction and basilar artery, as well as main branch vessels. Normal appearance of the posterior cerebral arteries. Bilateral posterior communicating arteries present. No large vessel occlusion, hemodynamically significant stenosis, dissection, luminal irregularity, contrast extravasation or aneurysm. VENOUS SINUSES: Major dural venous sinuses are patent though not tailored for evaluation on this angiographic examination. ANATOMIC VARIANTS: None. DELAYED PHASE: Mild RIGHT final cortical enhancement. Asymmetrically dense RIGHT middle cerebral artery segments on delayed phase. 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 LEFT frontal dural thickening versus chronic subdural hematoma. 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 Small bilateral pleural effusions. Sub cm probable bone islands T4 and T5 though, if there is a history of cancer, recommend bone scan. CTA HEAD: Occluded RIGHT M3 segment. Mild stenosis distal RIGHT M1 segment. Mild luminal irregularity of the RIGHT M2 segment may be due to extrinsic deformity, atherosclerosis or  vasospasm. No  large vessel occlusion. Electronically Signed   By: Elon Alas M.D.   On: 06/28/2016 07:02   Ct Angio Neck W Or Wo Contrast  Result Date: 06/28/2016 CLINICAL DATA:  Stroke, followup evaluation. Status post revascularization of occluded RIGHT middle cerebral artery. EXAM: CT ANGIOGRAPHY HEAD AND NECK TECHNIQUE: Multidetector CT imaging of the head and neck was performed using the standard protocol during bolus administration of intravenous contrast. Multiplanar CT image reconstructions and MIPs were obtained to evaluate the vascular anatomy. Carotid stenosis measurements (when applicable) are obtained utilizing NASCET criteria, using the distal internal carotid diameter as the denominator. CONTRAST:  50 cc Isovue 370 COMPARISON:  MRI head June 26, 2016 and FINDINGS: CT HEAD BRAIN: Blurring of the RIGHT frontotemporal parietal gray-white matter differentiation, with patchy RIGHT basal ganglia and corona radiata hypodensity corresponding to known acute infarct. Local sulcal effacement without midline shift. No intraparenchymal hemorrhage. RIGHT frontal and LEFT parietal lobe encephalomalacia. Mild ventriculomegaly, with partially effaced RIGHT temporal horn, no hydrocephalus. No significant extra-axial fluid collections. Minimal LEFT frontal dural thickening or chronic subdural hematoma. Minimally effaced RIGHT basal cisterns. VASCULAR: Mild calcific atherosclerosis of the carotid siphons. SKULL: No skull fracture. Old bilateral distal nasal bone fractures. No significant scalp soft tissue swelling. SINUSES/ORBITS: The mastoid air-cells and included paranasal sinuses are well-aerated.The included ocular globes and orbital contents are non-suspicious. OTHER: None. CTA NECK AORTIC ARCH: Normal appearance of the thoracic arch, normal branch pattern. Mild calcific atherosclerosis. The origins of the innominate, left Common carotid artery and subclavian artery are widely patent. RIGHT CAROTID  SYSTEM: Common carotid artery is widely patent, coursing in a straight line fashion. Mild eccentric calcific atherosclerosis of RIGHT internal carotid artery origin. Normal appearance of the carotid bifurcation without hemodynamically significant stenosis by NASCET criteria. 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 No intramural hematoma. LEFT CAROTID SYSTEM: Common carotid artery is widely patent, coursing in a straight line fashion. Normal appearance of the carotid bifurcation without hemodynamically significant stenosis by NASCET criteria. Normal appearance of the included internal carotid artery. VERTEBRAL ARTERIES:Mild stenosis RIGHT vertebral artery origin. Codominant vertebral arteries. Vertebral arteries are patent throughout the course. Mild extrinsic deformity due to degenerative cervical spine. SKELETON: No acute osseous process though bone windows have not been submitted. Poor dentition. Subcentimeter sclerotic foci T4 and T5 vertebral bodies . OTHER NECK: Soft tissues of the neck are non-acute though, not tailored for evaluation. Lobulated RIGHT pleural effusion extending into the fissure. Small layering LEFT pleural effusion. CTA HEAD ANTERIOR CIRCULATION: Normal appearance of the cervical internal carotid arteries, petrous, cavernous and supra clinoid internal carotid arteries. Widely patent anterior communicating artery. RIGHT A1 segment is dominant. Patent bilateral anterior cerebral arteries. Bilateral middle cerebral arteries are patent. Mild stenosis distal RIGHT M1. Mild luminal irregularity RIGHT M2 segment. Occluded distal RIGHT M3 segment. No large vessel occlusion, hemodynamically significant stenosis, dissection, contrast extravasation or aneurysm. POSTERIOR CIRCULATION: Normal appearance of the vertebral arteries, vertebrobasilar junction and basilar artery, as well as main branch vessels. Normal appearance of  the posterior cerebral arteries. Bilateral posterior communicating arteries present. No large vessel occlusion, hemodynamically significant stenosis, dissection, luminal irregularity, contrast extravasation or aneurysm. VENOUS SINUSES: Major dural venous sinuses are patent though not tailored for evaluation on this angiographic examination. ANATOMIC VARIANTS: None. DELAYED PHASE: Mild RIGHT final cortical enhancement. Asymmetrically dense RIGHT middle cerebral artery segments on delayed phase. 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 Old RIGHT frontal  and LEFT parietal lobe infarcts. LEFT frontal dural thickening versus chronic subdural hematoma. 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 Small bilateral pleural effusions. Sub cm probable bone islands T4 and T5 though, if there is a history of cancer, recommend bone scan. CTA HEAD: Occluded RIGHT M3 segment. Mild stenosis distal RIGHT M1 segment. Mild luminal irregularity of the RIGHT M2 segment may be due to extrinsic deformity, atherosclerosis or vasospasm. No large vessel occlusion. Electronically Signed   By: Elon Alas M.D.   On: 06/28/2016 07:02   Dg Abd Portable 1v  Result Date: 06/29/2016 CLINICAL DATA:  Patient status post NG tube placement. EXAM: PORTABLE ABDOMEN - 1 VIEW COMPARISON:  Abdominal radiograph 06/25/2016 FINDINGS: Motion artifact limits evaluation. Enteric tube tip projects over the gastric antrum/ proximal duodenum. Paucity of small bowel gas. Lumbar spine degenerative changes. IMPRESSION: Enteric tube tip projects over the gastric antrum/proximal duodenum. Exam limited due to motion artifact. Electronically Signed   By: Lovey Newcomer M.D.   On: 06/29/2016 14:34    Labs:  CBC:  Recent Labs  06/28/16 0305 06/29/16 0652 06/30/16 0425 07/01/16 0725  WBC 11.3* 8.6 6.2 7.2  HGB 10.3* 10.1* 9.8* 9.5*  HCT 31.1* 29.7*  29.4* 28.5*  PLT 117* PLATELET CLUMPS NOTED ON SMEAR, COUNT APPEARS ADEQUATE 132* 129*    COAGS:  Recent Labs  12/02/15 1250  04/19/16 0212  04/28/16 2040  06/01/16 0338 06/09/16 2304 06/10/16 0758 06/25/16 0923  INR 1.34  < > 1.89*  < >  --   < > 1.64 1.34 1.46 1.25  APTT 26  --  33  --  31  --   --   --   --  27  < > = values in this interval not displayed.  BMP:  Recent Labs  06/28/16 0305 06/29/16 0332 06/30/16 0425 07/01/16 0725  NA 141  140 140 140 141  K 3.9  3.9 3.9 2.9* 2.9*  CL 118*  117* 118* 116* 115*  CO2 18*  17* 14* 17* 17*  GLUCOSE 110*  110* 104* 147* 137*  BUN 8  8 11 12 16   CALCIUM 7.4*  7.3* 7.8* 8.0* 8.2*  CREATININE 0.82  0.80 0.87 0.95 1.58*  GFRNONAA >60  >60 >60 >60 41*  GFRAA >60  >60 >60 >60 48*    LIVER FUNCTION TESTS:  Recent Labs  06/12/16 0659 06/13/16 0449 06/14/16 0527 06/25/16 0923 06/27/16 0219 06/28/16 0305 06/29/16 0332  BILITOT 1.3* 1.3* 1.0 1.0  --   --   --   AST 59* 57* 65* 75*  --   --   --   ALT 19 19 19  39  --   --   --   ALKPHOS 70 64 82 78  --   --   --   PROT 5.6* 5.5* 5.9* 7.1  --   --   --   ALBUMIN 1.9* 1.9* 1.8* 2.5* 1.9* 1.8*  1.8* 1.8*    Assessment and Plan: 1. S/p (R) MCA revascularization on 9/21, now with small open wound at stick site -this area needs to be kept as dry as possible.  It is in the crease of his groin and therefore has caused some maceration and breakdown of his puncture site.  The puncture site now measures about 1x1x0.5cm.  I have very gently packed this wound with a NS WD dressing.  We can change this daily and wait for the wound  to heal by secondary intention. -will follow along  Electronically Signed: Aritzel Krusemark E 07/01/2016, 4:51 PM   I spent a total of 15 Minutes at the the patient's bedside AND on the patient's hospital floor or unit, greater than 50% of which was counseling/coordinating care for (R) MCA CVA, s/p revascularization with open wound at puncture  site

## 2016-07-01 NOTE — Progress Notes (Signed)
Rehab admissions - I met briefly with patient at his bedside.  I then called his wife.  She returned my call this pm.  Wife is interested in inpatient rehab admission.  I have opened the case with [facility] medicare and have faxed clinicals.  I hope to hear back from [facility] case manager tomorrow.  I will follow up tomorrow.  Call me for questions.  #[phone]

## 2016-07-02 ENCOUNTER — Inpatient Hospital Stay (HOSPITAL_COMMUNITY): Payer: Medicare Other

## 2016-07-02 LAB — CBC
HCT: 27.1 % — ABNORMAL LOW (ref 39.0–52.0)
Hemoglobin: 9.2 g/dL — ABNORMAL LOW (ref 13.0–17.0)
MCH: 32.3 pg (ref 26.0–34.0)
MCHC: 33.9 g/dL (ref 30.0–36.0)
MCV: 95.1 fL (ref 78.0–100.0)
PLATELETS: 140 10*3/uL — AB (ref 150–400)
RBC: 2.85 MIL/uL — AB (ref 4.22–5.81)
RDW: 16.7 % — ABNORMAL HIGH (ref 11.5–15.5)
WBC: 7.9 10*3/uL (ref 4.0–10.5)

## 2016-07-02 LAB — BASIC METABOLIC PANEL
Anion gap: 6 (ref 5–15)
BUN: 20 mg/dL (ref 6–20)
CHLORIDE: 117 mmol/L — AB (ref 101–111)
CO2: 19 mmol/L — ABNORMAL LOW (ref 22–32)
CREATININE: 1.78 mg/dL — AB (ref 0.61–1.24)
Calcium: 8.2 mg/dL — ABNORMAL LOW (ref 8.9–10.3)
GFR calc Af Amer: 41 mL/min — ABNORMAL LOW (ref >60)
GFR calc non Af Amer: 36 mL/min — ABNORMAL LOW (ref >60)
GLUCOSE: 133 mg/dL — AB (ref 65–99)
POTASSIUM: 2.9 mmol/L — AB (ref 3.5–5.1)
SODIUM: 142 mmol/L (ref 135–145)

## 2016-07-02 LAB — CULTURE, BLOOD (ROUTINE X 2)
CULTURE: NO GROWTH
CULTURE: NO GROWTH

## 2016-07-02 LAB — GLUCOSE, CAPILLARY
GLUCOSE-CAPILLARY: 119 mg/dL — AB (ref 65–99)
GLUCOSE-CAPILLARY: 125 mg/dL — AB (ref 65–99)
GLUCOSE-CAPILLARY: 126 mg/dL — AB (ref 65–99)
Glucose-Capillary: 114 mg/dL — ABNORMAL HIGH (ref 65–99)
Glucose-Capillary: 116 mg/dL — ABNORMAL HIGH (ref 65–99)

## 2016-07-02 MED ORDER — OXYCODONE HCL 5 MG PO TABS
5.0000 mg | ORAL_TABLET | Freq: Four times a day (QID) | ORAL | Status: DC | PRN
  Administered 2016-07-02 – 2016-07-04 (×2): 5 mg via ORAL
  Filled 2016-07-02 (×3): qty 1

## 2016-07-02 MED ORDER — METOPROLOL TARTRATE 5 MG/5ML IV SOLN
5.0000 mg | Freq: Once | INTRAVENOUS | Status: AC
  Administered 2016-07-02: 5 mg via INTRAVENOUS
  Filled 2016-07-02: qty 5

## 2016-07-02 MED ORDER — POTASSIUM CHLORIDE CRYS ER 20 MEQ PO TBCR
40.0000 meq | EXTENDED_RELEASE_TABLET | ORAL | Status: AC
  Administered 2016-07-02 – 2016-07-03 (×2): 40 meq via ORAL
  Filled 2016-07-02 (×2): qty 2

## 2016-07-02 MED ORDER — SODIUM CHLORIDE 0.9 % IV SOLN
INTRAVENOUS | Status: AC
  Administered 2016-07-02 – 2016-07-03 (×2): via INTRAVENOUS

## 2016-07-02 MED ORDER — SODIUM CHLORIDE 0.9 % IV SOLN: INTRAVENOUS | Status: DC

## 2016-07-02 MED ORDER — METOPROLOL TARTRATE 50 MG PO TABS
50.0000 mg | ORAL_TABLET | Freq: Two times a day (BID) | ORAL | Status: DC
  Administered 2016-07-02 – 2016-07-04 (×4): 50 mg via ORAL
  Filled 2016-07-02 (×4): qty 1

## 2016-07-02 NOTE — Progress Notes (Signed)
Referring Physician(s): Dr Rosalin Hawking  Supervising Physician: Luanne Bras  Patient Status:  Inpatient  Chief Complaint:  Rt groin wound  Subjective:  Dressing intact No bleeding Pt does not complain of pain afeb Wbc wnl  Allergies: Review of patient's allergies indicates no known allergies.  Medications: Prior to Admission medications   Medication Sig Start Date End Date Taking? Authorizing Provider  aspirin EC 325 MG tablet Take 1 tablet (325 mg total) by mouth daily. 06/16/16  Yes Florencia Reasons, MD  Cholecalciferol (VITAMIN D PO) Take 1 tablet by mouth daily.   Yes Historical Provider, MD  folic acid (FOLVITE) 1 MG tablet Take 1 tablet (1 mg total) by mouth daily. 04/21/16  Yes Maryann Mikhail, DO  furosemide (LASIX) 40 MG tablet Take 1 tablet (40 mg total) by mouth daily. 06/16/16  Yes Florencia Reasons, MD  levothyroxine (SYNTHROID, LEVOTHROID) 50 MCG tablet Take 1 tablet (50 mcg total) by mouth daily before breakfast. 06/16/16  Yes Florencia Reasons, MD  magnesium oxide (MAG-OX) 400 (241.3 Mg) MG tablet Take 1 tablet (400 mg total) by mouth daily. 06/03/16  Yes Verlee Monte, MD  metoprolol tartrate (LOPRESSOR) 25 MG tablet Take 1 tablet (25 mg total) by mouth 3 (three) times daily. 06/16/16  Yes Florencia Reasons, MD  Multiple Vitamin (MULTIVITAMIN WITH MINERALS) TABS tablet Take 1 tablet by mouth daily. 04/21/16  Yes Maryann Mikhail, DO  potassium chloride (K-DUR) 10 MEQ tablet Take 2 tablets (20 mEq total) by mouth daily. 06/16/16  Yes Florencia Reasons, MD  spironolactone (ALDACTONE) 25 MG tablet Take 0.5 tablets (12.5 mg total) by mouth daily. 06/16/16  Yes Florencia Reasons, MD  sucralfate (CARAFATE) 1 g tablet Take 1 tablet (1 g total) by mouth 2 (two) times daily. 06/16/16  Yes Florencia Reasons, MD  zolpidem (AMBIEN) 5 MG tablet Take 1 tablet (5 mg total) by mouth at bedtime as needed for sleep. 06/15/16  Yes Florencia Reasons, MD  lipase/protease/amylase (CREON) 12000 units CPEP capsule Take 1 capsule (12,000 Units total) by mouth 3  (three) times daily with meals. Patient not taking: Reported on 06/25/2016 06/16/16   Florencia Reasons, MD  saccharomyces boulardii (FLORASTOR) 250 MG capsule Take 1 capsule (250 mg total) by mouth 2 (two) times daily. Patient not taking: Reported on 06/25/2016 06/03/16   Verlee Monte, MD  thiamine 100 MG tablet Take 1 tablet (100 mg total) by mouth daily. Patient not taking: Reported on 05/27/2016 04/21/16   Cristal Ford, DO     Vital Signs: BP (!) 144/82 (BP Location: Right Wrist)   Pulse 99   Temp 97.7 F (36.5 C) (Oral)   Resp 18   Ht 6' (1.829 m)   Wt 228 lb 14.4 oz (103.8 kg)   SpO2 100%   BMI 31.04 kg/m   Physical Exam  Constitutional: He is oriented to person, place, and time.  Neurological: He is alert and oriented to person, place, and time.  Skin: Skin is warm.  Rt groin wound is clean and dry No bleeding NT No sign of infection Wet/dry dressing in place Tiny place of blood on dressing Wbc wnl afeb  Nursing note and vitals reviewed.   Imaging: Dg Abd Portable 1v  Result Date: 06/29/2016 CLINICAL DATA:  Patient status post NG tube placement. EXAM: PORTABLE ABDOMEN - 1 VIEW COMPARISON:  Abdominal radiograph 06/25/2016 FINDINGS: Motion artifact limits evaluation. Enteric tube tip projects over the gastric antrum/ proximal duodenum. Paucity of small bowel gas. Lumbar spine degenerative changes. IMPRESSION: Enteric  tube tip projects over the gastric antrum/proximal duodenum. Exam limited due to motion artifact. Electronically Signed   By: Lovey Newcomer M.D.   On: 06/29/2016 14:34   Dg Swallowing Func-speech Pathology  Result Date: 07/02/2016 Objective Swallowing Evaluation: Type of Study: MBS-Modified Barium Swallow Study Patient Details Name: Nathan Frank MRN: [id number] Date of Birth: 1940/11/20 Today's Date: 07/02/2016 Time: SLP Start Time (ACUTE ONLY): 0910-SLP Stop Time (ACUTE ONLY): 0931 SLP Time Calculation (min) (ACUTE ONLY): 21 min Past Medical History: Past Medical  History: Diagnosis Date . Abnormal TSH  . Acute gastric ulcer  . Acute gastritis with hemorrhage  . Alcohol abuse  . Alcohol dependence ([place]) 02/02/2014 . Anemia  . CHF (congestive heart failure) ([place])  . Cirrhosis ([place]) 06/04/2012 . Depressive disorder 02/01/2014 . Dizziness and giddiness 12/13/2014 . DVT of lower limb, acute ([place]) 06/06/2012 . Essential hypertension  . Fatty liver  . Gallstones  . Gastritis 06/07/2012 . GERD (gastroesophageal reflux disease)  . GI bleed due to NSAIDs 12/13/2014 . Granulomatous gastritis  . Hematuria 06/04/2012 . Hepatitis C  . Hepatocellular carcinoma ([place])  . Heroin abuse   "I haven't done that since I don't know when." . Heroin overdose 02/20/2014 . LV dysfunction   a. 04/2015: EF 45-50% by cath. . Neuropathy ([place])  . NSTEMI (non-ST elevated myocardial infarction) ([place])   a. 04/2015 - patent coronaries. Etiology possibly due to coronary spasm versus embolus, stress cardiomyopathy (atypical), and aborted infarction related to plaque rupture with thrombosis and dissolution. Amlodipine started. Not on antiplatelets due to GIB/cirrhosis history. . Oral thrush 06/05/2012 . Polysubstance abuse   Rare marijuana.  No EtOH x 2 months.  . Prolonged Q-T interval on ECG   a. 12/2014 - treated with magnesium. . Right knee pain 12/13/2014 . S/P alcohol detoxification 02/02/2014 . SVT (supraventricular tachycardia) ([place])   a. 12/2014 in setting of GIB, ETOH, NSAIDS, gastritis. . Symptomatic cholelithiasis 12/15/2013 . Thrombocytopenia ([place])  . Upper GI bleeding 12/13/2014 . Weight loss 06/04/2012 Past Surgical History: Past Surgical History: Procedure Laterality Date . CARDIAC CATHETERIZATION N/A 04/15/2015  Procedure: Left Heart Cath and Coronary Angiography;  Surgeon: Belva Crome, MD;  Location: [facility];  Service: Cardiovascular;  Laterality: N/A; . CIRCUMCISION   . ESOPHAGOGASTRODUODENOSCOPY  06/07/2012  Procedure: ESOPHAGOGASTRODUODENOSCOPY (EGD);  Surgeon: Milus Banister, MD;  Location: [facility];   Service: Endoscopy;  Laterality: N/A;  may need treatment of varices . ESOPHAGOGASTRODUODENOSCOPY N/A 12/14/2014  Procedure: ESOPHAGOGASTRODUODENOSCOPY (EGD);  Surgeon: Jerene Bears, MD;  Location: [facility] ENDOSCOPY;  Service: Endoscopy;  Laterality: N/A; . IR GENERIC HISTORICAL  06/25/2016  IR US GUIDE VASC ACCESS LEFT 06/25/2016 Corrie Mckusick, DO [facility] . IR GENERIC HISTORICAL  06/25/2016  IR RADIOLOGY PERIPHERAL GUIDED IV START 06/25/2016 Corrie Mckusick, DO [facility] . IR GENERIC HISTORICAL  06/25/2016  IR PERCUTANEOUS ART THROMBECTOMY/INFUSION INTRACRANIAL INC DIAG ANGIO 06/25/2016 Luanne Bras, MD [facility] . RADIOLOGY WITH ANESTHESIA N/A 06/25/2016  Procedure: RADIOLOGY WITH ANESTHESIA;  Surgeon: Luanne Bras, MD;  Location: [facility];  Service: Radiology;  Laterality: N/A; . TONSILLECTOMY   HPI: 76 year old male w/ sig medical h/o: CAD, CHF (EF 40-45%), GIB, Cirrhosis, Hep C and hepatocellular carcinoma, thrombocytopenia and ETOH abuse. Admitted to the ER on 9/21 after being found on floor w/ left sided facial droop, left sided weakness and dysarthria. Dx with right MCA infarct. He was transferred to interventional radiology where he underwent thrombectomy of the R MCA clot. He returned to the  ICU on full vent support. Extubated 9/23.  No Data Recorded Assessment / Plan / Recommendation CHL IP CLINICAL IMPRESSIONS 07/02/2016 Therapy Diagnosis Mild pharyngeal phase dysphagia Clinical Impression Pt desmontrates very mild oropharyngeal dysphagia characterized by a slight delay in swallow that pt compensates for with a brief oral hold. In most cases with a single swallow the pt can achieve complete airway closure before the bolus passes over the epiglottis, though episodes of flash penetration did occur occasionally. With consecutive cup sips pt had sensed frank penetration and reflexive cough was extremely wet sounding, though no stasis or standing secretions were observed. In fact oral and pharyngeal  strength were excellent with no residuals throughout test.  Pt followed min verbal cues for single sips throughout the rest of the test. Recommend pt initiate a dys 3 mechanical soft diet with thin liquids with intermittent reminders for single sips.  Impact on safety and function Mild aspiration risk   CHL IP TREATMENT RECOMMENDATION 07/02/2016 Treatment Recommendations Therapy as outlined in treatment plan below   Prognosis 07/02/2016 Prognosis for Safe Diet Advancement Good Barriers to Reach Goals Severity of deficits Barriers/Prognosis Comment -- CHL IP DIET RECOMMENDATION 07/02/2016 SLP Diet Recommendations Dysphagia 3 (Mech soft) solids;Thin liquid Liquid Administration via Cup;Straw Medication Administration Whole meds with liquid Compensations Slow rate;Small sips/bites;Minimize environmental distractions Postural Changes Remain semi-upright after after feeds/meals (Comment)   CHL IP OTHER RECOMMENDATIONS 07/02/2016 Recommended Consults -- Oral Care Recommendations Oral care BID Other Recommendations --   CHL IP FOLLOW UP RECOMMENDATIONS 07/02/2016 Follow up Recommendations Inpatient Rehab   CHL IP FREQUENCY AND DURATION 07/02/2016 Speech Therapy Frequency (ACUTE ONLY) min 2x/week Treatment Duration 2 weeks      CHL IP ORAL PHASE 07/02/2016 Oral Phase WFL Oral - Pudding Teaspoon -- Oral - Pudding Cup -- Oral - Honey Teaspoon -- Oral - Honey Cup -- Oral - Nectar Teaspoon -- Oral - Nectar Cup -- Oral - Nectar Straw -- Oral - Thin Teaspoon -- Oral - Thin Cup -- Oral - Thin Straw -- Oral - Puree -- Oral - Mech Soft -- Oral - Regular -- Oral - Multi-Consistency -- Oral - Pill -- Oral Phase - Comment --  CHL IP PHARYNGEAL PHASE 07/02/2016 Pharyngeal Phase Impaired Pharyngeal- Pudding Teaspoon -- Pharyngeal -- Pharyngeal- Pudding Cup -- Pharyngeal -- Pharyngeal- Honey Teaspoon -- Pharyngeal -- Pharyngeal- Honey Cup -- Pharyngeal -- Pharyngeal- Nectar Teaspoon -- Pharyngeal -- Pharyngeal- Nectar Cup -- Pharyngeal --  Pharyngeal- Nectar Straw -- Pharyngeal -- Pharyngeal- Thin Teaspoon -- Pharyngeal -- Pharyngeal- Thin Cup Delayed swallow initiation-vallecula;Penetration/Aspiration before swallow;Penetration/Aspiration during swallow Pharyngeal Material enters airway, CONTACTS cords and then ejected out;Material enters airway, remains ABOVE vocal cords then ejected out;Material does not enter airway Pharyngeal- Thin Straw Delayed swallow initiation-vallecula;Penetration/Aspiration before swallow;Penetration/Aspiration during swallow Pharyngeal Material enters airway, remains ABOVE vocal cords then ejected out;Material does not enter airway Pharyngeal- Puree Delayed swallow initiation-vallecula Pharyngeal -- Pharyngeal- Mechanical Soft Delayed swallow initiation-vallecula Pharyngeal -- Pharyngeal- Regular -- Pharyngeal -- Pharyngeal- Multi-consistency -- Pharyngeal -- Pharyngeal- Pill Delayed swallow initiation-vallecula Pharyngeal -- Pharyngeal Comment --  No flowsheet data found. No flowsheet data found. Herbie Baltimore, MA CCC-SLP [phone] Lynann Beaver 07/02/2016, 9:46 AM               Labs:  CBC:  Recent Labs  06/29/16 0652 06/30/16 0425 07/01/16 0725 07/02/16 0707  WBC 8.6 6.2 7.2 7.9  HGB 10.1* 9.8* 9.5* 9.2*  HCT 29.7* 29.4* 28.5* 27.1*  PLT PLATELET CLUMPS NOTED ON SMEAR, COUNT  APPEARS ADEQUATE 132* 129* 140*    COAGS:  Recent Labs  12/02/15 1250  04/19/16 0212  04/28/16 2040  06/01/16 0338 06/09/16 2304 06/10/16 0758 06/25/16 0923  INR 1.34  < > 1.89*  < >  --   < > 1.64 1.34 1.46 1.25  APTT 26  --  33  --  31  --   --   --   --  27  < > = values in this interval not displayed.  BMP:  Recent Labs  06/29/16 0332 06/30/16 0425 07/01/16 0725 07/02/16 0707  NA 140 140 141 142  K 3.9 2.9* 2.9* 2.9*  CL 118* 116* 115* 117*  CO2 14* 17* 17* 19*  GLUCOSE 104* 147* 137* 133*  BUN 11 12 16 20   CALCIUM 7.8* 8.0* 8.2* 8.2*  CREATININE 0.87 0.95 1.58* 1.78*  GFRNONAA >60 >60  41* 36*  GFRAA >60 >60 48* 41*    LIVER FUNCTION TESTS:  Recent Labs  06/12/16 0659 06/13/16 0449 06/14/16 0527 06/25/16 0923 06/27/16 0219 06/28/16 0305 06/29/16 0332  BILITOT 1.3* 1.3* 1.0 1.0  --   --   --   AST 59* 57* 65* 75*  --   --   --   ALT 19 19 19  39  --   --   --   ALKPHOS 70 64 82 78  --   --   --   PROT 5.6* 5.5* 5.9* 7.1  --   --   --   ALBUMIN 1.9* 1.9* 1.8* 2.5* 1.9* 1.8*  1.8* 1.8*    Assessment and Plan:  Rt groin wound post 9/21 arteriogram with revascularization of R MCA Wet/dry dressing in place Orders for dressing in chart Will follow  Electronically Signed: Abbi Mancini A 07/02/2016, 11:45 AM   I spent a total of 15 Minutes at the the patient's bedside AND on the patient's hospital floor or unit, greater than 50% of which was counseling/coordinating care for rt groin wound

## 2016-07-02 NOTE — Progress Notes (Signed)
Modified swallow evaluation was completed pt changed to D3/thin diet. Tolerated breakfast tray 100%. Spoke with Neurologist instructed to remove stop feeding and removed NG tube. NG tube removed. Lungs clear. No noted distress. Will continue to monitor.

## 2016-07-02 NOTE — NC FL2 (Signed)
[place] LEVEL OF CARE SCREENING TOOL     IDENTIFICATION  Patient Name: Nathan Frank Birthdate: January 10, 1941 Sex: male Admission Date (Current Location): 06/25/2016  [facility] and [place] Number:  Herbalist and Address:  [facility], [place] [address] [place], [place] [address]      Provider Number: [id number]  Attending Physician Name and Address:  Rosalin Hawking, MD  Relative Name and Phone Number:       Current Level of Care: Hospital Recommended Level of Care: [place] Prior Approval Number:    Date Approved/Denied:   PASRR Number:    Discharge Plan: SNF    Current Diagnoses: Patient Active Problem List   Diagnosis Date Noted  . Obesity 07/01/2016  . Family hx-stroke 07/01/2016  . Hyperglycemia 07/01/2016  . Wound of left leg 07/01/2016  . Dysphagia, post-stroke   . Dysarthria, post-stroke   . Pyrexia   . Bleeding gastrointestinal   . Polysubstance abuse   . Acute on chronic combined systolic and diastolic heart failure ([place])   . Mitral valve regurgitation   . Tachypnea   . Acute blood loss anemia   . Leukocytosis   . Cerebral infarction due to embolism of right middle cerebral artery ([place]) - s/p mechanical thrombectomy 06/25/2016  . Hypomagnesemia 06/09/2016  . Acute on chronic diastolic CHF (congestive heart failure) ([place]) 06/09/2016  . Alcoholic cirrhosis of liver with ascites ([place])   . Other specified hypothyroidism   . Hepatocellular carcinoma ([place])   . Alcohol withdrawal ([place]) 05/27/2016  . Generalized weakness 05/27/2016  . Hypokalemia 05/27/2016  . Dehydration 05/27/2016  . Diarrhea 04/28/2016  . SIRS (systemic inflammatory response syndrome) ([place]) 04/19/2016  . Alcohol use disorder, moderate, in controlled environment, dependence ([place])   . Lactic acidosis   . Tachycardia   . Atrial fibrillation with RVR ([place]) 04/18/2016  . Chest pain 04/18/2016  . Hypotension 04/18/2016  . Elevated  lactic acid level 04/18/2016  . Chronic atrial fibrillation ([place]) 04/18/2016  . SVT (supraventricular tachycardia) ([place])   . Prolonged Q-T interval on ECG   . Neuropathy ([place])   . Essential hypertension   . Thrombocytopenia ([place]) 04/16/2015  . Abnormal TSH 04/16/2015  . NSTEMI (non-ST elevated myocardial infarction) ([place])   . Acute gastric ulcer   . Acute gastritis with hemorrhage   . GI bleed due to NSAIDs 12/13/2014  . Right knee pain 12/13/2014  . Dizziness and giddiness 12/13/2014  . Upper GI bleeding 12/13/2014  . Heroin overdose 02/20/2014  . S/P alcohol detoxification 02/02/2014  . Alcohol dependence ([place]) 02/02/2014  . Depressive disorder 02/01/2014  . Symptomatic cholelithiasis 12/15/2013  . Abdominal pain 12/14/2013  . Gastritis 06/07/2012  . DVT of lower limb, acute ([place]) 06/06/2012  . Anemia 06/06/2012  . Oral thrush 06/05/2012  . Alcohol abuse 06/04/2012  . Weight loss 06/04/2012  . 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 Liver cirrhosis ([place]) 06/04/2012  . Hematuria 06/04/2012    Orientation RESPIRATION BLADDER Height & Weight     Self, Time, Situation, Place  Normal Incontinent Weight: 228 lb 14.4 oz (103.8 kg) Height:  6' (182.9 cm)  BEHAVIORAL SYMPTOMS/MOOD NEUROLOGICAL BOWEL NUTRITION STATUS      Continent Diet (DYS 3, Thin Liquids)  AMBULATORY STATUS COMMUNICATION OF NEEDS Skin   Extensive Assist Verbally Normal  Personal Care Assistance Level of Assistance  Bathing, Dressing, Feeding Bathing Assistance: Limited assistance Feeding assistance: Independent Dressing Assistance: Limited assistance     Functional Limitations Info  Sight, Hearing, Speech Sight Info: Adequate Hearing Info: Adequate Speech Info: Impaired    SPECIAL CARE FACTORS FREQUENCY  PT (By licensed PT), OT (By licensed OT), Speech therapy     PT Frequency: 5 OT Frequency: 5     Speech Therapy Frequency: 5       Contractures Contractures Info: Not present    Additional Factors Info  Code Status, Allergies Code Status Info: Full Code Allergies Info: No known allergies           Current Medications (07/02/2016):  This is the current hospital active medication list Current Facility-Administered Medications  Medication Dose Route Frequency Provider Last Rate Last Dose  . aspirin tablet 325 mg  325 mg Per Tube Daily Rosalin Hawking, MD   325 mg at 07/02/16 1021  . chlorhexidine (PERIDEX) 0.12 % solution 15 mL  15 mL Mouth Rinse BID Brand Males, MD   15 mL at 07/02/16 1021  . enoxaparin (LOVENOX) injection 40 mg  40 mg Subcutaneous Q24H Rosalin Hawking, MD   40 mg at 07/02/16 1644  . levothyroxine (SYNTHROID, LEVOTHROID) tablet 50 mcg  50 mcg Per Tube Q24H Javier Glazier, MD   50 mcg at 07/02/16 1328  . MEDLINE mouth rinse  15 mL Mouth Rinse 10 times per day Anders Simmonds, MD   15 mL at 07/02/16 1800  . metoprolol (LOPRESSOR) tablet 50 mg  50 mg Oral BID Rosalin Hawking, MD      . multivitamin with minerals tablet 1 tablet  1 tablet Oral Daily Rosalin Hawking, MD   1 tablet at 07/02/16 1021  . oxyCODONE (Oxy IR/ROXICODONE) immediate release tablet 5 mg  5 mg Oral Q6H PRN Rosalin Hawking, MD   5 mg at 07/02/16 1021  . sucralfate (CARAFATE) 1 GM/10ML suspension 1 g  1 g Oral Q6H Anders Simmonds, MD   1 g at 07/02/16 1644  . thiamine (VITAMIN B-1) tablet 100 mg  100 mg Per Tube Daily Javier Glazier, MD   100 mg at 07/02/16 1021     Discharge Medications: Please see discharge summary for a list of discharge medications.  Relevant Imaging Results:  Relevant Lab Results:   Additional Information SSN:  [id number]  Darden Dates, LCSW

## 2016-07-02 NOTE — Progress Notes (Signed)
RN receives a call from telemetry informing her of pt having A-fib RVR since am with hight HR in 150. Pt in the 120's on the monitor but remains asymptomatic; MD called and notified and new orders received. Will continue to closely monitor Pt. Delia Heady RN

## 2016-07-02 NOTE — Progress Notes (Signed)
Physical Therapy Treatment Patient Details Name: Nathan Frank MRN: [id number] DOB: Oct 31, 1940 Today's Date: 07/02/2016    History of Present Illness 75 year old male w/ sig medical h/o: CAD, CHF (EF 40-45%), GIB, Cirrhosis, Hep C and hepatocellular carcinoma, thrombocytopenia and ETOH abuse. Admitted to the ER on 9/21 after being found on floor w/ left sided facial droop, left sided weakness and dysarthria. Dx with right MCA infarct. He was transferred to interventional radiology where he underwent thrombectomy of the R MCA clot. He returned to the ICU on full vent support. Extubated 9/23.      PT Comments    Current plan remains appropriate.   Follow Up Recommendations  CIR     Equipment Recommendations  None recommended by PT    Recommendations for Other Services OT consult;Rehab consult     Precautions / Restrictions Precautions Precautions: Fall Restrictions Weight Bearing Restrictions: No    Mobility  Bed Mobility Overal bed mobility: Needs Assistance Bed Mobility: Rolling;Sidelying to Sit Rolling: Mod assist Sidelying to sit: Max assist       General bed mobility comments: assist to mobilize bilat LE and rotate and elevate trunk into sitting; HOB elevated and use of rail  Transfers Overall transfer level: Needs assistance Equipment used: Rolling walker (2 wheeled) Transfers: Sit to/from [facility] Sit to Stand: Mod assist;From elevated surface Stand pivot transfers: Mod assist;+2 physical assistance       General transfer comment: from EOB and BSC; pt fatigued after sitting on BSC and had increased difficulty with advancing L LE and weight shifting; assist to power up into standing and maintain balance in standing; assist to weight shift, mainatain L grip on RW, pivot L foot, and manage RW with pivot  Ambulation/Gait Ambulation/Gait assistance: Mod assist;+2 safety/equipment Ambulation Distance (Feet): 4 Feet Assistive device: Rolling  walker (2 wheeled) Gait Pattern/deviations: Step-to pattern;Decreased step length - left;Decreased weight shift to right;Trunk flexed;Wide base of support Gait velocity: decreased   General Gait Details: multimodal cues for posture, weight shifting, and advancing L LE; limited by pt requesting BSC and fatigue   Stairs            Wheelchair Mobility    Modified Rankin (Stroke Patients Only) Modified Rankin (Stroke Patients Only) Pre-Morbid Rankin Score: Moderate disability Modified Rankin: Severe disability     Balance     Sitting balance-Leahy Scale: Fair       Standing balance-Leahy Scale: Poor                      Cognition Arousal/Alertness: Awake/alert Behavior During Therapy: WFL for tasks assessed/performed Overall Cognitive Status: Impaired/Different from baseline Area of Impairment: Following commands;Problem solving       Following Commands: Follows multi-step commands inconsistently;Follows multi-step commands with increased time     Problem Solving: Slow processing;Decreased initiation;Difficulty sequencing;Requires verbal cues;Requires tactile cues      Exercises      General Comments        Pertinent Vitals/Pain Pain Assessment: No/denies pain    Home Living                      Prior Function            PT Goals (current goals can now be found in the care plan section) Acute Rehab PT Goals Patient Stated Goal: get in chair Progress towards PT goals: Progressing toward goals    Frequency    Min 4X/week  PT Plan Current plan remains appropriate    Co-evaluation             End of Session Equipment Utilized During Treatment: Gait belt Activity Tolerance: Patient tolerated treatment well Patient left: in chair;with call bell/phone within reach;with chair alarm set     Time: 1345-1412 PT Time Calculation (min) (ACUTE ONLY): 27 min  Charges:  $Gait Training: 8-22 mins $Therapeutic Activity:  8-22 mins                    G Codes:      Darliss Cheney Jul 15, 2016, 2:35 PM

## 2016-07-02 NOTE — Progress Notes (Signed)
MBSS complete. Full report located under chart review in imaging section. Claxton Levitz, MA CCC-SLP [phone]  

## 2016-07-02 NOTE — Progress Notes (Signed)
Nutrition Follow-up   INTERVENTION:  Monitor PO intake for adequacy Continue Multivitamin with minerals daily   NUTRITION DIAGNOSIS:   Inadequate oral intake related to inability to eat as evidenced by NPO status.  Resolved/discontinued  GOAL:   Patient will meet greater than or equal to 90% of their needs  Progressing  MONITOR:   PO intake, Skin, I & O's, Labs, Weight trends  REASON FOR ASSESSMENT:   Consult Enteral/tube feeding initiation and management  ASSESSMENT:   75 year old male admitted on 9/21 with right MCA infarct. S/P thrombectomy of the R MCA clot. He returned to the ICU on full vent support.   Pt had MBS this morning and diet was advanced to Dysphagia 3 with thin liquids. Pt reports having a good appetite. He ate 100% of breakfast. He is happy to have feeding tube out of his nose.   Labs: low potassium, elevated chloride, low calcium, low hemoglobin  Diet Order:  DIET DYS 3 Room service appropriate? Yes; Fluid consistency: Thin  Skin:  Reviewed, no issues  Last BM:  9/28  Height:   Ht Readings from Last 1 Encounters:  06/29/16 6' (1.829 m)    Weight:   Wt Readings from Last 1 Encounters:  06/30/16 228 lb 14.4 oz (103.8 kg)    Ideal Body Weight:  75.5 kg  BMI:  Body mass index is 31.04 kg/m.  Estimated Nutritional Needs:   Kcal:  1950-2150  Protein:  100-120 grams  Fluid:  2 L  EDUCATION NEEDS:   No education needs identified at this time  Scarlette Ar RD, CSP, LDN Inpatient Clinical Dietitian Pager: [phone] After Hours Pager: [phone]

## 2016-07-02 NOTE — Progress Notes (Signed)
STROKE TEAM PROGRESS NOTE   SUBJECTIVE (INTERVAL HISTORY) His wife is at the bedside. Passed MBSS this am - can take D3 thin diet. Ready to get NG tube out. Still has mucous and weak cough. Stated that breathing treatment did not help much. Pending CIR tomorrow.    OBJECTIVE Temp:  [97.7 F (36.5 C)-98.2 F (36.8 C)] 97.7 F (36.5 C) (09/28 0900) Pulse Rate:  [99-118] 99 (09/28 0900) Cardiac Rhythm: Atrial fibrillation;Bundle branch block (09/28 0700) Resp:  [18-20] 18 (09/28 0900) BP: (118-144)/(82-95) 144/82 (09/28 0900) SpO2:  [98 %-100 %] 100 % (09/28 0900)  CBC:   Recent Labs Lab 06/28/16 0305 06/29/16 0652  07/01/16 0725 07/02/16 0707  WBC 11.3* 8.6  < > 7.2 7.9  NEUTROABS 9.8* 6.5  --   --   --   HGB 10.3* 10.1*  < > 9.5* 9.2*  HCT 31.1* 29.7*  < > 28.5* 27.1*  MCV 99.7 97.4  < > 96.6 95.1  PLT 117* PLATELET CLUMPS NOTED ON SMEAR, COUNT APPEARS ADEQUATE  < > 129* 140*  < > = values in this interval not displayed.  Basic Metabolic Panel:   Recent Labs Lab 06/28/16 0305 06/29/16 0332  07/01/16 0725 07/02/16 0707  NA 141  140 140  < > 141 142  K 3.9  3.9 3.9  < > 2.9* 2.9*  CL 118*  117* 118*  < > 115* 117*  CO2 18*  17* 14*  < > 17* 19*  GLUCOSE 110*  110* 104*  < > 137* 133*  BUN 8  8 11   < > 16 20  CREATININE 0.82  0.80 0.87  < > 1.58* 1.78*  CALCIUM 7.4*  7.3* 7.8*  < > 8.2* 8.2*  MG 1.8 2.0  --   --   --   PHOS 2.6  2.7 2.6  --   --   --   < > = values in this interval not displayed.  Lipid Panel:     Component Value Date/Time   CHOL 87 06/26/2016 0500   TRIG 91 06/27/2016 0219   HDL 26 (L) 06/26/2016 0500   CHOLHDL 3.3 06/26/2016 0500   VLDL 23 06/26/2016 0500   LDLCALC 38 06/26/2016 0500   HgbA1c:  Lab Results  Component Value Date   HGBA1C 4.5 (L) 06/26/2016   Urine Drug Screen:     Component Value Date/Time   LABOPIA POSITIVE (A) 06/09/2016 2059   COCAINSCRNUR NONE DETECTED 06/09/2016 2059   COCAINSCRNUR NEGATIVE  12/13/2014 2353   LABBENZ POSITIVE (A) 06/09/2016 2059   LABBENZ POSITIVE (A) 12/13/2014 2353   AMPHETMU NONE DETECTED 06/09/2016 2059   THCU NONE DETECTED 06/09/2016 2059   LABBARB NONE DETECTED 06/09/2016 2059      IMAGING I have personally reviewed the radiological images below and agree with the radiology interpretations.  MRI brain without contrast 06/26/2016 1. Acute moderately large nonhemorrhagic right MCA infarct. 2. Chronic right frontal left parietal infarcts. 3. Small chronic extra-axial collection or dural thickening in the anterior left frontal region.  CTA Head and Neck 06/28/2016 CT HEAD:  Evolving large RIGHT MCA territory nonhemorrhagic infarct. Old RIGHT frontal and LEFT parietal lobe infarcts. LEFT frontal dural thickening versus chronic subdural hematoma. 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 Small bilateral pleural effusions. Sub cm probable bone islands T4 and T5 though, if there is a history of cancer, recommend bone scan. ( History of hepato-cellular carcinoma)  CTA HEAD:  Occluded RIGHT M3 segment. Mild stenosis distal RIGHT M1 segment. Mild luminal irregularity of the RIGHT M2 segment may be due to extrinsic deformity, atherosclerosis or vasospasm. No large vessel occlusion.   Ct Head Code Stroke Wo Contrast 06/25/2016 1. Evolving moderate-sized acute right MCA infarct. No evidence of acute intracranial hemorrhage.  2. Chronic ischemic changes as above. 3. Small volume extra-axial material over the anterior left frontal lobe, present since 2014. This may reflect dural thickening nor a chronic, complex subdural collection. No mass effect.   Cerebral Angio 06/25/2016 S/P Rt common carotid arteriogram,followed by complete revascularization of occluded RT MCA prox with x 3 passes with solitaire 4 mm x 40 mm FR retrieval device ,and ACE penumbra aspiration device, and 7.2 mg of IA  integrelin superselectively achieving a TICI 3 reperfusion  Ct Head Wo Contrast post IR 06/25/2016 Dense right MCA with generalized decreased attenuation involving the right parietal and temporal lobes consistent with acute ischemia. Areas of subacute to chronic ischemia as described. No acute hemorrhage is noted.   TTE 06/16/16  - Left ventricle: Posterior , septal and apical hypokinesis The cavity size was mildly dilated. Wall thickness was normal. Systolic function was mildly to moderately reduced. The estimated ejection fraction was in the range of 40% to 45%. Left ventricular diastolic function parameters were normal. - Mitral valve: There was mild regurgitation. - Atrial septum: No defect or patent foramen ovale was identified.  LE venous doppler - The popliteal vein in the right lower extremity appears to be non-compressible of an unknown etiology. However, color fill and doppler signals are normal. Deep vein thrombosis of the right lower extremity can not be ruled out. There is no evidence of superficial vein thrombosis involving the right lower extremity. There is no evidence of deep or superficial vein thrombosis involving the left lower extremity. -  No evidence of Baker&'s cyst on the right or left.  PHYSICAL EXAM  Temp:  [97.7 F (36.5 C)-98.2 F (36.8 C)] 97.7 F (36.5 C) (09/28 0900) Pulse Rate:  [99-118] 99 (09/28 0900) Resp:  [18-20] 18 (09/28 0900) BP: (118-144)/(82-95) 144/82 (09/28 0900) SpO2:  [98 %-100 %] 100 % (09/28 0900)  General - Well nourished, well developed, not in acute distress.  Ophthalmologic - Fundi not visualized due to noncooperation.  Cardiovascular - Regular rate and rhythm.  Mental Status -  Level of arousal and orientation to time, place, and person were intact. Language including expression, naming, repetition, comprehension was assessed and found intact, moderate dysarthria.  Cranial Nerves II - XII - II - Visual field intact OU. III,  IV, VI - Extraocular movements intact. V - Facial sensation intact bilaterally. VII - Facial movement intact bilaterally. VIII - Hearing & vestibular intact bilaterally. X - Palate elevates symmetrically. XI - Chin turning & shoulder shrug intact bilaterally. XII - Tongue protrusion intact.  Motor Strength - The patient's strength was 5/5 RUE and RLE, 4/5 LUE and 4/5 LLE proximal and 2+/5 distally.  Bulk was normal and fasciculations were absent.   Motor Tone - Muscle tone was assessed at the neck and appendages and was normal.  Reflexes - The patient's reflexes were 1+ in all extremities and he had no pathological reflexes.  Sensory - Light touch, temperature/pinprick were assessed and were symmetrical.    Coordination - The patient had normal movements in the hands with no ataxia or dysmetria.  Tremor was absent.  Gait and Station - not tested due to safety concerns.  ASSESSMENT/PLAN Nathan Frank is a 75 y.o. male with history of CAD, CHF (EF 40-45%), GIB, Cirrhosis, Hep C and hepatocellular carcinoma, thrombocytopenia and ETOH abuse presenting with L hemiparesis, L facial and dysarthria. He did not receive IV t-PA due to delay in arrival. He was taken to IR where he received TICI 3 revascularization of occluded R MCA using solitaire, penumbra and IA integrilin.   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   CTA neck showed right M1 stenosis and right M3 occlusion  Cerebral angio - TICI3 vascularization of occluded right MCA  2D Echo - EF 40-45% on 06/16/16.   LDL 38  HgbA1c 4.5  lovenox for VTE prophylaxis DIET DYS 3 Room service appropriate? Yes; Fluid consistency: Thin. New this am. Ate greater than 50% of his tray thus far.  ASA 325 mg for secondary stroke  prevention  Ongoing aggressive stroke risk factor management  Therapy recommendations:  CIR. Admissions coordinator is following, awaiting [employer] case manager review for admission. If no CIR bed available, recommend SNF placement. SW/CM aware  Disposition:  Pending. Medically ready for discharge   AFib w/ RVR  Hx of palpitation and SVT but diagnosed with afib RVR in 04/2016  Followed up with cardiology Dr. Harrington Challenger  Considered not anticoagulation candidate in the past due to hx of GIB, substance abuse, alcohol abuse and noncompliant with medication.  Currently not anticoagulation candidate due to large right MCA infarct  On ASA 325mg  now  Continued to have afib RVR intermittently  Increase metoprolol to 50 mg bid for rate control  Need to follow up with Dr. Harrington Challenger after CIR to consider anticoagulation given embolic stroke most likely due to afib not on [facility]  CHADS2-VASc = 6 and HASBLED = 4  DVT  B/L DVT at LEs indeterminate age on 06/14/16  No anticoagulation treatment  Repeat LE venous doppler this time did not see clear cut DVT  Will not treat this time  Liver cirrhosis  Likely due to alcohol and hepC  CT abd/pelvis showed evidence of liver nodule  12/2014 EGD showed no esophageal varices  Albumin low at 1.8  INR 1.76 in 05/2016, now 1.25  Ammonia 102 in 05/2016, now 44  AST 252 in 04/2016, now 75  ? Liver cancer ?  CT abd/pelvis 05/2016 did not show liver mass  MRI 05/2016 did not show liver mass   Hx of GIB  12/2014 black stool  EGD showed gastritis - contribute to NSAIDs use  EGD did not show esophageal varices.  Hypertension  Stable  Long-term BP goal normotensive  Dysphagia / Aspiration  Secondary to stroke  Requires suctioning prn  passed to D3 thin diet.  Good intake of breakfast tray afterwards  Will d/c panda and TF.   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 Covered with dressing. Healing.   Right inguinal angio site unhealing  wound - IR on board - daily dressing change  Hospital Day #7  Rosalin Hawking, MD PhD Stroke Neurology 07/02/2016 10:29 PM    To contact Stroke Continuity provider, please refer to [email or web address] After hours, contact General Neurology

## 2016-07-02 NOTE — Progress Notes (Signed)
Rehab admissions - I have approval for acute inpatient rehab admission.  I will try to get patient into inpatient rehab tomorrow.  Call me for questions.  [id number]

## 2016-07-03 ENCOUNTER — Inpatient Hospital Stay (HOSPITAL_COMMUNITY): Payer: Medicare Other

## 2016-07-03 DIAGNOSIS — I10 Essential (primary) hypertension: Secondary | ICD-10-CM

## 2016-07-03 DIAGNOSIS — K909 Intestinal malabsorption, unspecified: Secondary | ICD-10-CM

## 2016-07-03 LAB — CBC
HCT: 25.6 % — ABNORMAL LOW (ref 39.0–52.0)
HEMOGLOBIN: 8.5 g/dL — AB (ref 13.0–17.0)
MCH: 32.1 pg (ref 26.0–34.0)
MCHC: 33.2 g/dL (ref 30.0–36.0)
MCV: 96.6 fL (ref 78.0–100.0)
PLATELETS: 179 10*3/uL (ref 150–400)
RBC: 2.65 MIL/uL — AB (ref 4.22–5.81)
RDW: 17.1 % — AB (ref 11.5–15.5)
WBC: 8.2 10*3/uL (ref 4.0–10.5)

## 2016-07-03 LAB — IRON AND TIBC
Iron: 34 ug/dL — ABNORMAL LOW (ref 45–182)
Saturation Ratios: 16 % — ABNORMAL LOW (ref 17.9–39.5)
TIBC: 207 ug/dL — ABNORMAL LOW (ref 250–450)
UIBC: 173 ug/dL

## 2016-07-03 LAB — BASIC METABOLIC PANEL
Anion gap: 5 (ref 5–15)
BUN: 19 mg/dL (ref 6–20)
CHLORIDE: 118 mmol/L — AB (ref 101–111)
CO2: 17 mmol/L — ABNORMAL LOW (ref 22–32)
CREATININE: 1.72 mg/dL — AB (ref 0.61–1.24)
Calcium: 8.3 mg/dL — ABNORMAL LOW (ref 8.9–10.3)
GFR calc Af Amer: 43 mL/min — ABNORMAL LOW (ref >60)
GFR, EST NON AFRICAN AMERICAN: 37 mL/min — AB (ref >60)
Glucose, Bld: 97 mg/dL (ref 65–99)
Potassium: 3.3 mmol/L — ABNORMAL LOW (ref 3.5–5.1)
SODIUM: 140 mmol/L (ref 135–145)

## 2016-07-03 LAB — OCCULT BLOOD X 1 CARD TO LAB, STOOL: Fecal Occult Bld: NEGATIVE

## 2016-07-03 LAB — GLUCOSE, CAPILLARY
GLUCOSE-CAPILLARY: 109 mg/dL — AB (ref 65–99)
GLUCOSE-CAPILLARY: 97 mg/dL (ref 65–99)

## 2016-07-03 LAB — FERRITIN: Ferritin: 499 ng/mL — ABNORMAL HIGH (ref 24–336)

## 2016-07-03 LAB — C DIFFICILE QUICK SCREEN W PCR REFLEX
C DIFFICILE (CDIFF) TOXIN: NEGATIVE
C DIFFICLE (CDIFF) ANTIGEN: NEGATIVE
C Diff interpretation: NOT DETECTED

## 2016-07-03 LAB — MAGNESIUM: MAGNESIUM: 1.6 mg/dL — AB (ref 1.7–2.4)

## 2016-07-03 MED ORDER — ORAL CARE MOUTH RINSE
15.0000 mL | Freq: Three times a day (TID) | OROMUCOSAL | Status: DC
  Administered 2016-07-03 – 2016-07-04 (×4): 15 mL via OROMUCOSAL

## 2016-07-03 MED ORDER — SODIUM CHLORIDE 0.9 % IV SOLN
INTRAVENOUS | Status: DC
  Administered 2016-07-03: 19:00:00 via INTRAVENOUS
  Administered 2016-07-03: 1000 mL via INTRAVENOUS
  Administered 2016-07-04: 13:00:00 via INTRAVENOUS

## 2016-07-03 NOTE — Progress Notes (Signed)
Referring Physician(s): Dr Rosalin Hawking  Supervising Physician: Luanne Bras  Patient Status:  Inpatient  Chief Complaint:  Rt groin wound  Subjective:  Wound is healing with wet/dry dressings Denies pain     Allergies: Review of patient's allergies indicates no known allergies.  Medications: Prior to Admission medications   Medication Sig Start Date End Date Taking? Authorizing Provider  aspirin EC 325 MG tablet Take 1 tablet (325 mg total) by mouth daily. 06/16/16  Yes Florencia Reasons, MD  Cholecalciferol (VITAMIN D PO) Take 1 tablet by mouth daily.   Yes Historical Provider, MD  folic acid (FOLVITE) 1 MG tablet Take 1 tablet (1 mg total) by mouth daily. 04/21/16  Yes Maryann Mikhail, DO  furosemide (LASIX) 40 MG tablet Take 1 tablet (40 mg total) by mouth daily. 06/16/16  Yes Florencia Reasons, MD  levothyroxine (SYNTHROID, LEVOTHROID) 50 MCG tablet Take 1 tablet (50 mcg total) by mouth daily before breakfast. 06/16/16  Yes Florencia Reasons, MD  magnesium oxide (MAG-OX) 400 (241.3 Mg) MG tablet Take 1 tablet (400 mg total) by mouth daily. 06/03/16  Yes Verlee Monte, MD  metoprolol tartrate (LOPRESSOR) 25 MG tablet Take 1 tablet (25 mg total) by mouth 3 (three) times daily. 06/16/16  Yes Florencia Reasons, MD  Multiple Vitamin (MULTIVITAMIN WITH MINERALS) TABS tablet Take 1 tablet by mouth daily. 04/21/16  Yes Maryann Mikhail, DO  potassium chloride (K-DUR) 10 MEQ tablet Take 2 tablets (20 mEq total) by mouth daily. 06/16/16  Yes Florencia Reasons, MD  spironolactone (ALDACTONE) 25 MG tablet Take 0.5 tablets (12.5 mg total) by mouth daily. 06/16/16  Yes Florencia Reasons, MD  sucralfate (CARAFATE) 1 g tablet Take 1 tablet (1 g total) by mouth 2 (two) times daily. 06/16/16  Yes Florencia Reasons, MD  zolpidem (AMBIEN) 5 MG tablet Take 1 tablet (5 mg total) by mouth at bedtime as needed for sleep. 06/15/16  Yes Florencia Reasons, MD  lipase/protease/amylase (CREON) 12000 units CPEP capsule Take 1 capsule (12,000 Units total) by mouth 3 (three) times daily  with meals. Patient not taking: Reported on 06/25/2016 06/16/16   Florencia Reasons, MD  saccharomyces boulardii (FLORASTOR) 250 MG capsule Take 1 capsule (250 mg total) by mouth 2 (two) times daily. Patient not taking: Reported on 06/25/2016 06/03/16   Verlee Monte, MD  thiamine 100 MG tablet Take 1 tablet (100 mg total) by mouth daily. Patient not taking: Reported on 05/27/2016 04/21/16   Cristal Ford, DO     Vital Signs: BP 120/77 (BP Location: Right Wrist)   Pulse 81   Temp 98 F (36.7 C) (Oral)   Resp 18   Ht 6' (1.829 m)   Wt 103.8 kg (228 lb 14.4 oz)   SpO2 100%   BMI 31.04 kg/m   Physical Exam  Neurological: He is alert.  Skin: Skin is warm and dry.  Right groin wound some better Wet/dry dressing in place No bleeding noted NT No sign of infection  Vitals reviewed.   Imaging: Dg Abd Portable 1v  Result Date: 06/29/2016 CLINICAL DATA:  Patient status post NG tube placement. EXAM: PORTABLE ABDOMEN - 1 VIEW COMPARISON:  Abdominal radiograph 06/25/2016 FINDINGS: Motion artifact limits evaluation. Enteric tube tip projects over the gastric antrum/ proximal duodenum. Paucity of small bowel gas. Lumbar spine degenerative changes. IMPRESSION: Enteric tube tip projects over the gastric antrum/proximal duodenum. Exam limited due to motion artifact. Electronically Signed   By: Lovey Newcomer M.D.   On: 06/29/2016 14:34   Dg  Swallowing Func-speech Pathology  Result Date: 07/02/2016 Objective Swallowing Evaluation: Type of Study: MBS-Modified Barium Swallow Study Patient Details Name: Nathan Frank MRN: [id number] Date of Birth: 1941/01/22 Today's Date: 07/02/2016 Time: SLP Start Time (ACUTE ONLY): 0910-SLP Stop Time (ACUTE ONLY): 0931 SLP Time Calculation (min) (ACUTE ONLY): 21 min Past Medical History: Past Medical History: Diagnosis Date . Abnormal TSH  . Acute gastric ulcer  . Acute gastritis with hemorrhage  . Alcohol abuse  . Alcohol dependence ([place]) 02/02/2014 . Anemia  . CHF (congestive heart  failure) ([place])  . Cirrhosis ([place]) 06/04/2012 . Depressive disorder 02/01/2014 . Dizziness and giddiness 12/13/2014 . DVT of lower limb, acute ([place]) 06/06/2012 . Essential hypertension  . Fatty liver  . Gallstones  . Gastritis 06/07/2012 . GERD (gastroesophageal reflux disease)  . GI bleed due to NSAIDs 12/13/2014 . Granulomatous gastritis  . Hematuria 06/04/2012 . Hepatitis C  . Hepatocellular carcinoma ([place])  . Heroin abuse   "I haven't done that since I don't know when." . Heroin overdose 02/20/2014 . LV dysfunction   a. 04/2015: EF 45-50% by cath. . Neuropathy ([place])  . NSTEMI (non-ST elevated myocardial infarction) ([place])   a. 04/2015 - patent coronaries. Etiology possibly due to coronary spasm versus embolus, stress cardiomyopathy (atypical), and aborted infarction related to plaque rupture with thrombosis and dissolution. Amlodipine started. Not on antiplatelets due to GIB/cirrhosis history. . Oral thrush 06/05/2012 . Polysubstance abuse   Rare marijuana.  No EtOH x 2 months.  . Prolonged Q-T interval on ECG   a. 12/2014 - treated with magnesium. . Right knee pain 12/13/2014 . S/P alcohol detoxification 02/02/2014 . SVT (supraventricular tachycardia) ([place])   a. 12/2014 in setting of GIB, ETOH, NSAIDS, gastritis. . Symptomatic cholelithiasis 12/15/2013 . Thrombocytopenia ([place])  . Upper GI bleeding 12/13/2014 . Weight loss 06/04/2012 Past Surgical History: Past Surgical History: Procedure Laterality Date . CARDIAC CATHETERIZATION N/A 04/15/2015  Procedure: Left Heart Cath and Coronary Angiography;  Surgeon: Belva Crome, MD;  Location: [facility];  Service: Cardiovascular;  Laterality: N/A; . CIRCUMCISION   . ESOPHAGOGASTRODUODENOSCOPY  06/07/2012  Procedure: ESOPHAGOGASTRODUODENOSCOPY (EGD);  Surgeon: Milus Banister, MD;  Location: [facility];  Service: Endoscopy;  Laterality: N/A;  may need treatment of varices . ESOPHAGOGASTRODUODENOSCOPY N/A 12/14/2014  Procedure: ESOPHAGOGASTRODUODENOSCOPY (EGD);  Surgeon: Jerene Bears,  MD;  Location: [facility];  Service: Endoscopy;  Laterality: N/A; . IR GENERIC HISTORICAL  06/25/2016  IR US GUIDE VASC ACCESS LEFT 06/25/2016 Corrie Mckusick, DO [facility] . IR GENERIC HISTORICAL  06/25/2016  IR RADIOLOGY PERIPHERAL GUIDED IV START 06/25/2016 Corrie Mckusick, DO [facility] . IR GENERIC HISTORICAL  06/25/2016  IR PERCUTANEOUS ART THROMBECTOMY/INFUSION INTRACRANIAL INC DIAG ANGIO 06/25/2016 Luanne Bras, MD [facility] . RADIOLOGY WITH ANESTHESIA N/A 06/25/2016  Procedure: RADIOLOGY WITH ANESTHESIA;  Surgeon: Luanne Bras, MD;  Location: [facility];  Service: Radiology;  Laterality: N/A; . TONSILLECTOMY   HPI: 75 year old male w/ sig medical h/o: CAD, CHF (EF 40-45%), GIB, Cirrhosis, Hep C and hepatocellular carcinoma, thrombocytopenia and ETOH abuse. Admitted to the ER on 9/21 after being found on floor w/ left sided facial droop, left sided weakness and dysarthria. Dx with right MCA infarct. He was transferred to interventional radiology where he underwent thrombectomy of the R MCA clot. He returned to the ICU on full vent support. Extubated 9/23.  No Data Recorded Assessment / Plan / Recommendation CHL IP CLINICAL IMPRESSIONS 07/02/2016 Therapy Diagnosis Mild pharyngeal phase dysphagia Clinical Impression Pt desmontrates  very mild oropharyngeal dysphagia characterized by a slight delay in swallow that pt compensates for with a brief oral hold. In most cases with a single swallow the pt can achieve complete airway closure before the bolus passes over the epiglottis, though episodes of flash penetration did occur occasionally. With consecutive cup sips pt had sensed frank penetration and reflexive cough was extremely wet sounding, though no stasis or standing secretions were observed. In fact oral and pharyngeal strength were excellent with no residuals throughout test.  Pt followed min verbal cues for single sips throughout the rest of the test. Recommend pt initiate a dys 3 mechanical soft diet  with thin liquids with intermittent reminders for single sips.  Impact on safety and function Mild aspiration risk   CHL IP TREATMENT RECOMMENDATION 07/02/2016 Treatment Recommendations Therapy as outlined in treatment plan below   Prognosis 07/02/2016 Prognosis for Safe Diet Advancement Good Barriers to Reach Goals Severity of deficits Barriers/Prognosis Comment -- CHL IP DIET RECOMMENDATION 07/02/2016 SLP Diet Recommendations Dysphagia 3 (Mech soft) solids;Thin liquid Liquid Administration via Cup;Straw Medication Administration Whole meds with liquid Compensations Slow rate;Small sips/bites;Minimize environmental distractions Postural Changes Remain semi-upright after after feeds/meals (Comment)   CHL IP OTHER RECOMMENDATIONS 07/02/2016 Recommended Consults -- Oral Care Recommendations Oral care BID Other Recommendations --   CHL IP FOLLOW UP RECOMMENDATIONS 07/02/2016 Follow up Recommendations Inpatient Rehab   CHL IP FREQUENCY AND DURATION 07/02/2016 Speech Therapy Frequency (ACUTE ONLY) min 2x/week Treatment Duration 2 weeks      CHL IP ORAL PHASE 07/02/2016 Oral Phase WFL Oral - Pudding Teaspoon -- Oral - Pudding Cup -- Oral - Honey Teaspoon -- Oral - Honey Cup -- Oral - Nectar Teaspoon -- Oral - Nectar Cup -- Oral - Nectar Straw -- Oral - Thin Teaspoon -- Oral - Thin Cup -- Oral - Thin Straw -- Oral - Puree -- Oral - Mech Soft -- Oral - Regular -- Oral - Multi-Consistency -- Oral - Pill -- Oral Phase - Comment --  CHL IP PHARYNGEAL PHASE 07/02/2016 Pharyngeal Phase Impaired Pharyngeal- Pudding Teaspoon -- Pharyngeal -- Pharyngeal- Pudding Cup -- Pharyngeal -- Pharyngeal- Honey Teaspoon -- Pharyngeal -- Pharyngeal- Honey Cup -- Pharyngeal -- Pharyngeal- Nectar Teaspoon -- Pharyngeal -- Pharyngeal- Nectar Cup -- Pharyngeal -- Pharyngeal- Nectar Straw -- Pharyngeal -- Pharyngeal- Thin Teaspoon -- Pharyngeal -- Pharyngeal- Thin Cup Delayed swallow initiation-vallecula;Penetration/Aspiration before  swallow;Penetration/Aspiration during swallow Pharyngeal Material enters airway, CONTACTS cords and then ejected out;Material enters airway, remains ABOVE vocal cords then ejected out;Material does not enter airway Pharyngeal- Thin Straw Delayed swallow initiation-vallecula;Penetration/Aspiration before swallow;Penetration/Aspiration during swallow Pharyngeal Material enters airway, remains ABOVE vocal cords then ejected out;Material does not enter airway Pharyngeal- Puree Delayed swallow initiation-vallecula Pharyngeal -- Pharyngeal- Mechanical Soft Delayed swallow initiation-vallecula Pharyngeal -- Pharyngeal- Regular -- Pharyngeal -- Pharyngeal- Multi-consistency -- Pharyngeal -- Pharyngeal- Pill Delayed swallow initiation-vallecula Pharyngeal -- Pharyngeal Comment --  No flowsheet data found. No flowsheet data found. Herbie Baltimore, MA CCC-SLP [phone] Lynann Beaver 07/02/2016, 9:46 AM               Labs:  CBC:  Recent Labs  06/30/16 0425 07/01/16 0725 07/02/16 0707 07/03/16 0416  WBC 6.2 7.2 7.9 8.2  HGB 9.8* 9.5* 9.2* 8.5*  HCT 29.4* 28.5* 27.1* 25.6*  PLT 132* 129* 140* 179    COAGS:  Recent Labs  12/02/15 1250  04/19/16 0212  04/28/16 2040  06/01/16 0338 06/09/16 2304 06/10/16 0758 06/25/16 0923  INR 1.34  < > 1.89*  < >  --   < >  1.64 1.34 1.46 1.25  APTT 26  --  33  --  31  --   --   --   --  27  < > = values in this interval not displayed.  BMP:  Recent Labs  06/30/16 0425 07/01/16 0725 07/02/16 0707 07/03/16 0416  NA 140 141 142 140  K 2.9* 2.9* 2.9* 3.3*  CL 116* 115* 117* 118*  CO2 17* 17* 19* 17*  GLUCOSE 147* 137* 133* 97  BUN 12 16 20 19   CALCIUM 8.0* 8.2* 8.2* 8.3*  CREATININE 0.95 1.58* 1.78* 1.72*  GFRNONAA >60 41* 36* 37*  GFRAA >60 48* 41* 43*    LIVER FUNCTION TESTS:  Recent Labs  06/12/16 0659 06/13/16 0449 06/14/16 0527 06/25/16 0923 06/27/16 0219 06/28/16 0305 06/29/16 0332  BILITOT 1.3* 1.3* 1.0 1.0  --   --   --     AST 59* 57* 65* 75*  --   --   --   ALT 19 19 19  39  --   --   --   ALKPHOS 70 64 82 78  --   --   --   PROT 5.6* 5.5* 5.9* 7.1  --   --   --   ALBUMIN 1.9* 1.9* 1.8* 2.5* 1.9* 1.8*  1.8* 1.8*    Assessment and Plan:  Right groin wound post cerebral arteriogram/R MCA revascularization 9/21 Will follow  Electronically Signed: Chi Garlow A 07/03/2016, 7:45 AM   I spent a total of 15 Minutes at the the patient's bedside AND on the patient's hospital floor or unit, greater than 50% of which was counseling/coordinating care for rt groin wound

## 2016-07-03 NOTE — Care Management Note (Signed)
Case Management Note  Patient Details  Name: Nathan Frank MRN: [id number] Date of Birth: Dec 26, 1940  Subjective/Objective:                    Action/Plan: Plan is for CIR when patient medically stable. Pts HGB dropped to 8.5 today. CM following for any further d/c needs.   Expected Discharge Date:                  Expected Discharge Plan:     In-House Referral:     Discharge planning Services     Post Acute Care Choice:    Choice offered to:     DME Arranged:    DME Agency:     HH Arranged:    HH Agency:     Status of Service:     If discussed at [employer], dates discussed:    Additional Comments:  Pollie Friar, RN 07/03/2016, 12:59 PM

## 2016-07-03 NOTE — Care Management Important Message (Signed)
Important Message  Patient Details  Name: Nathan Frank MRN: [id number] Date of Birth: Aug 23, 1941   Medicare Important Message Given:  Other (see comment)    [name] 07/03/2016, 11:37 AM

## 2016-07-03 NOTE — Progress Notes (Signed)
STROKE TEAM PROGRESS NOTE   SUBJECTIVE (INTERVAL HISTORY) No family is at the bedside. Still has diarrhea 2-3 times a day, which is his baseline at home. C. difficile negative. On IV fluid overnight, family is slightly improved this morning. Still has anemia Hemoglobin 8.5 this morning.      OBJECTIVE Temp:  [97.6 F (36.4 C)-98.3 F (36.8 C)] 97.9 F (36.6 C) (09/29 1758) Pulse Rate:  [81-108] 84 (09/29 1758) Cardiac Rhythm: Atrial fibrillation (09/29 0806) Resp:  [18-20] 20 (09/29 1758) BP: (114-132)/(77-88) 118/83 (09/29 1758) SpO2:  [98 %-100 %] 100 % (09/29 1758)  CBC:   Recent Labs Lab 06/28/16 0305 06/29/16 0652  07/02/16 0707 07/03/16 0416  WBC 11.3* 8.6  < > 7.9 8.2  NEUTROABS 9.8* 6.5  --   --   --   HGB 10.3* 10.1*  < > 9.2* 8.5*  HCT 31.1* 29.7*  < > 27.1* 25.6*  MCV 99.7 97.4  < > 95.1 96.6  PLT 117* PLATELET CLUMPS NOTED ON SMEAR, COUNT APPEARS ADEQUATE  < > 140* 179  < > = values in this interval not displayed.  Basic Metabolic Panel:   Recent Labs Lab 06/28/16 0305 06/29/16 0332  07/02/16 0707 07/03/16 0416  NA 141  140 140  < > 142 140  K 3.9  3.9 3.9  < > 2.9* 3.3*  CL 118*  117* 118*  < > 117* 118*  CO2 18*  17* 14*  < > 19* 17*  GLUCOSE 110*  110* 104*  < > 133* 97  BUN 8  8 11   < > 20 19  CREATININE 0.82  0.80 0.87  < > 1.78* 1.72*  CALCIUM 7.4*  7.3* 7.8*  < > 8.2* 8.3*  MG 1.8 2.0  --   --  1.6*  PHOS 2.6  2.7 2.6  --   --   --   < > = values in this interval not displayed.  Lipid Panel:     Component Value Date/Time   CHOL 87 06/26/2016 0500   TRIG 91 06/27/2016 0219   HDL 26 (L) 06/26/2016 0500   CHOLHDL 3.3 06/26/2016 0500   VLDL 23 06/26/2016 0500   LDLCALC 38 06/26/2016 0500   HgbA1c:  Lab Results  Component Value Date   HGBA1C 4.5 (L) 06/26/2016   Urine Drug Screen:     Component Value Date/Time   LABOPIA POSITIVE (A) 06/09/2016 2059   COCAINSCRNUR NONE DETECTED 06/09/2016 2059   COCAINSCRNUR NEGATIVE  12/13/2014 2353   LABBENZ POSITIVE (A) 06/09/2016 2059   LABBENZ POSITIVE (A) 12/13/2014 2353   AMPHETMU NONE DETECTED 06/09/2016 2059   THCU NONE DETECTED 06/09/2016 2059   LABBARB NONE DETECTED 06/09/2016 2059      IMAGING I have personally reviewed the radiological images below and agree with the radiology interpretations.  MRI brain without contrast 06/26/2016 1. Acute moderately large nonhemorrhagic right MCA infarct. 2. Chronic right frontal left parietal infarcts. 3. Small chronic extra-axial collection or dural thickening in the anterior left frontal region.  CTA Head and Neck 06/28/2016 CT HEAD:  Evolving large RIGHT MCA territory nonhemorrhagic infarct. Old RIGHT frontal and LEFT parietal lobe infarcts. LEFT frontal dural thickening versus chronic subdural hematoma. 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 Small bilateral pleural effusions. Sub cm probable bone islands T4 and T5 though, if there is a history of cancer, recommend bone scan. ( History of hepato-cellular carcinoma) CTA HEAD:  Occluded RIGHT M3 segment. Mild stenosis distal RIGHT M1 segment. Mild luminal irregularity of the RIGHT M2 segment may be due to extrinsic deformity, atherosclerosis or vasospasm. No large vessel occlusion.   Ct Head Code Stroke Wo Contrast 06/25/2016 1. Evolving moderate-sized acute right MCA infarct. No evidence of acute intracranial hemorrhage.  2. Chronic ischemic changes as above. 3. Small volume extra-axial material over the anterior left frontal lobe, present since 2014. This may reflect dural thickening nor a chronic, complex subdural collection. No mass effect.   Cerebral Angio 06/25/2016 S/P Rt common carotid arteriogram,followed by complete revascularization of occluded RT MCA prox with x 3 passes with solitaire 4 mm x 40 mm FR retrieval device ,and ACE penumbra aspiration device, and 7.2 mg of IA  integrelin superselectively achieving a TICI 3 reperfusion  Ct Head Wo Contrast post IR 06/25/2016 Dense right MCA with generalized decreased attenuation involving the right parietal and temporal lobes consistent with acute ischemia. Areas of subacute to chronic ischemia as described. No acute hemorrhage is noted.   TTE 06/16/16  - Left ventricle: Posterior , septal and apical hypokinesis The cavity size was mildly dilated. Wall thickness was normal. Systolic function was mildly to moderately reduced. The estimated ejection fraction was in the range of 40% to 45%. Left ventricular diastolic function parameters were normal. - Mitral valve: There was mild regurgitation. - Atrial septum: No defect or patent foramen ovale was identified.  LE venous doppler - The popliteal vein in the right lower extremity appears to be non-compressible of an unknown etiology. However, color fill and doppler signals are normal. Deep vein thrombosis of the right lower extremity can not be ruled out. There is no evidence of superficial vein thrombosis involving the right lower extremity. There is no evidence of deep or superficial vein thrombosis involving the left lower extremity. -  No evidence of Baker&'s cyst on the right or left.  Dg Chest Port 1 View 07/03/2016 IMPRESSION: Heterogeneous opacities within the lung bases bilaterally favored to represent atelectasis. Aspiration or infection not excluded.   PHYSICAL EXAM  Temp:  [97.6 F (36.4 C)-98.3 F (36.8 C)] 97.9 F (36.6 C) (09/29 1758) Pulse Rate:  [81-108] 84 (09/29 1758) Resp:  [18-20] 20 (09/29 1758) BP: (114-132)/(77-88) 118/83 (09/29 1758) SpO2:  [98 %-100 %] 100 % (09/29 1758)  General - Well nourished, well developed, not in acute distress.  Ophthalmologic - Fundi not visualized due to noncooperation.  Cardiovascular - Regular rate and rhythm.  Mental Status -  Level of arousal and orientation to time, place, and person were  intact. Language including expression, naming, repetition, comprehension was assessed and found intact, moderate dysarthria.  Cranial Nerves II - XII - II - Visual field intact OU. III, IV, VI - Extraocular movements intact. V - Facial sensation intact bilaterally. VII - Facial movement intact bilaterally. VIII - Hearing & vestibular intact bilaterally. X - Palate elevates symmetrically. XI - Chin turning & shoulder shrug intact bilaterally. XII - Tongue protrusion intact.  Motor Strength - The patient's strength was 5/5 RUE and RLE, 4/5 LUE and 4/5 LLE proximal and 2+/5 distally.  Bulk was normal and fasciculations were absent.   Motor Tone - Muscle tone was assessed at the neck and appendages and was normal.  Reflexes - The patient's reflexes were 1+ in all extremities and he had no pathological reflexes.  Sensory - Light touch, temperature/pinprick were assessed and were symmetrical.    Coordination - The patient had normal movements in the  hands with no ataxia or dysmetria.  Tremor was absent.  Gait and Station - not tested due to safety concerns.   ASSESSMENT/PLAN Nathan Frank is a 75 y.o. male with history of CAD, CHF (EF 40-45%), GIB, Cirrhosis, Hep C and hepatocellular carcinoma, thrombocytopenia and ETOH abuse presenting with L hemiparesis, L facial and dysarthria. He did not receive IV t-PA due to delay in arrival. He was taken to IR where he received TICI 3 revascularization of occluded R MCA using solitaire, penumbra and IA integrilin.   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   CTA neck showed right M1 stenosis and right M3 occlusion  Cerebral angio - TICI3 vascularization of occluded right MCA  2D Echo - EF 40-45% on 06/16/16.   LDL 38  HgbA1c 4.5  lovenox  for VTE prophylaxis DIET DYS 3 Room service appropriate? Yes; Fluid consistency: Thin.   ASA 325 mg for secondary stroke prevention  Ongoing aggressive stroke risk factor management  Therapy recommendations:  CIR.   Disposition:  Pending.   AFib w/ RVR  Hx of palpitation and SVT but diagnosed with afib RVR in 04/2016  Followed up with cardiology Dr. Harrington Challenger  Considered not anticoagulation candidate in the past due to hx of GIB, substance abuse, alcohol abuse and noncompliant with medication.  Currently not anticoagulation candidate due to large right MCA infarct  On ASA 325mg  now  Continued to have afib RVR intermittently  Increase metoprolol to 50 mg bid for rate control  Need to follow up with Dr. Harrington Challenger after CIR to consider anticoagulation given embolic stroke most likely due to afib not on [facility]  CHADS2-VASc = 6 and HASBLED = 4  DVT  B/L DVT at LEs indeterminate age on 06/14/16  No anticoagulation treatment  Repeat LE venous doppler this time did not see clear cut DVT  Will not treat this time  Liver cirrhosis  Likely due to alcohol and hepC  CT abd/pelvis showed evidence of liver nodule  12/2014 EGD showed no esophageal varices  Albumin low at 1.8  INR 1.76 in 05/2016, now 1.25  Ammonia 102 in 05/2016, now 44  AST 252 in 04/2016, now 75  ? Liver cancer ?  CT abd/pelvis 05/2016 did not show liver mass  MRI 05/2016 did not show liver mass   Hx of GIB  12/2014 black stool  EGD showed gastritis - contribute to NSAIDs use  EGD did not show esophageal varices.  AKI  Cre 0.95-1.58 - 1.78 - 1.72  On IV fluid  BMP in a.m.  Anemia of chronic disease  Hb 10.1-> 9.5-> 9.2-> 8.5  Iron 34, ferritin 499  CBC in a.m.  Hypertension  Stable Long-term BP goal normotensive On metoprolol 50 twice a day  Other Stroke Risk Factors  Advanced age  Former Cigarette smoker  ETOH abuse, advised to drink no more than 2 drink(s) a day  Hx heroin use with  OD, not used recently per chart  Overweight, Body mass index is 31.04 kg/m., recommend weight loss, diet and exercise as appropriate   Family hx stroke (father)  Coronary artery disease  CHF EF 40-45%  Other Active Problems  Hyperglycemia without hx diabetes 137-147  LLE superficial abrasion. Covered with dressing. Healing.   Right inguinal angio site unhealing wound -  IR on board - daily dressing change  Hospital Day #8  Rosalin Hawking, MD PhD Stroke Neurology 07/03/2016 7:00 PM    To contact Stroke Continuity provider, please refer to [email or web address] After hours, contact General Neurology

## 2016-07-03 NOTE — PMR Pre-admission (Signed)
PMR Admission Coordinator Pre-Admission Assessment  Patient: Nathan Frank is an 75 y.o., male MRN: [id number] DOB: 20-Dec-1940 Height: 6' (182.9 cm) Weight: 103.8 kg (228 lb 14.4 oz)              Insurance Information HMO: Yes   PPO:       PCP:       IPA:       80/20:       OTHER:  Group # [id number] PRIMARY:  [employer] Medicare      Policy#: [id number]      Subscriber:  [place] Name: Sharlett Iles      Phone#: [phone]     Fax#: [phone] but has Epic access Pre-Cert#:  [id number]      Employer:  Retired Benefits:  Phone #: [phone]     Name:  Waldon Merl. Date: 12/04/15     Deduct:  $0      Out of Pocket Max: $4900 (met [phone])      Life Max: unlimited CIR: $345 days 1-5      SNF:  $0 days 1-20; $160 days 21-51; $0 days 52-100 Outpatient: Medical necessity     Co-Pay: $40 Home Health: 100%      Co-Pay: none DME: 80%     Co-Pay: 20% Providers: in network  Medicaid Application Date:        Case Manager:   Disability Application Date:        Case Worker:    Emergency Facilities manager Information    Name Relation Home Work Mobile   Wyeth,Yvonne Spouse [phone]  [place]. Son   [phone]     Current Medical History  Patient Admitting Diagnosis:  R MCA infarct  History of Present Illness: A 75 y.o.male with history of Afib, alcoholic cirrhosis with ascites/thrombocytopenia, GIB, polysubstance abuse, Hep C, hepatocellular CA, A fib (no anticoagulation due to non-compliance),  most recent admission for acute on chronic CHF. He was admitted on 06/25/16 after being found on the floor with left sided weakness, right gaze preference and facial droop with dysarthria. 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 He underwent cerebral angio with thrombectomy of R-MCA clot and IA Integrilin. 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 2D echo with EF 40-45% and mild  MVR. He tolerated extubation on 09/23 but noted to have weak cough with difficulty handling secretions. He developed fever and was started on IV Vanc/Zosyn.  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 Infarct felt to be embolic due to unknown source and patient placed on ASA for secondary stroke prevention. Swallow evaluation showed significant dysphagia which has improved and he was started on dysphagia 3, thin liquids on 09/28.  He did have an episode of Afib with RVR yesterday that resolved with addition of BB. Dr. Erlinda Hong recommends anticoagulation and patient to follow up with cardiology after discharge for input. Patient with resultant left sided weakness, dysarthria and cognitive deficits affecting mobility and self care.  CIR recommended.   Total: 4=NIH  Past Medical History  Past Medical History:  Diagnosis Date  . Abnormal TSH   . Acute gastric ulcer   . Acute gastritis with hemorrhage   . Alcohol abuse   . Alcohol dependence ([place]) 02/02/2014  . Anemia   .  CHF (congestive heart failure) ([place])   . Cirrhosis ([place]) 06/04/2012  . Depressive disorder 02/01/2014  . Dizziness and giddiness 12/13/2014  . DVT of lower limb, acute ([place]) 06/06/2012  . Essential hypertension   . Fatty liver   . Gallstones   . Gastritis 06/07/2012  . GERD (gastroesophageal reflux disease)   . GI bleed due to NSAIDs 12/13/2014  . Granulomatous gastritis   . Hematuria 06/04/2012  . Hepatitis C   . Hepatocellular carcinoma ([place])   . Heroin abuse    "I haven't done that since I don't know when."  . Heroin overdose 02/20/2014  . LV dysfunction    a. 04/2015: EF 45-50% by cath.  . Neuropathy ([place])   . NSTEMI (non-ST elevated myocardial infarction) ([place])    a. 04/2015 - patent coronaries. Etiology possibly due to coronary spasm versus  embolus, stress cardiomyopathy (atypical), and aborted infarction related to plaque rupture with thrombosis and dissolution. Amlodipine started. Not on antiplatelets due to GIB/cirrhosis history.  . Oral thrush 06/05/2012  . Polysubstance abuse    Rare marijuana.  No EtOH x 2 months.   . Prolonged Q-T interval on ECG    a. 12/2014 - treated with magnesium.  . Right knee pain 12/13/2014  . S/P alcohol detoxification 02/02/2014  . SVT (supraventricular tachycardia) ([place])    a. 12/2014 in setting of GIB, ETOH, NSAIDS, gastritis.  . Symptomatic cholelithiasis 12/15/2013  . Thrombocytopenia ([place])   . Upper GI bleeding 12/13/2014  . Weight loss 06/04/2012    Family History  family history includes Heart disease in his mother; Prostate cancer in his brother; Stroke in his father.  Prior Rehab/Hospitalizations: No previous rehab.  Has the patient had major surgery during 100 days prior to admission? No  Current Medications   Current Facility-Administered Medications:  .  aspirin tablet 325 mg, 325 mg, Per Tube, Daily, Rosalin Hawking, MD, 325 mg at 07/03/16 1029 .  chlorhexidine (PERIDEX) 0.12 % solution 15 mL, 15 mL, Mouth Rinse, BID, Brand Males, MD, 15 mL at 07/03/16 1028 .  enoxaparin (LOVENOX) injection 40 mg, 40 mg, Subcutaneous, Q24H, Rosalin Hawking, MD, 40 mg at 07/02/16 1644 .  levothyroxine (SYNTHROID, LEVOTHROID) tablet 50 mcg, 50 mcg, Per Tube, Q24H, Javier Glazier, MD, 50 mcg at 07/03/16 1204 .  MEDLINE mouth rinse, 15 mL, Mouth Rinse, TID PC & HS, Rosalin Hawking, MD .  metoprolol (LOPRESSOR) tablet 50 mg, 50 mg, Oral, BID, Rosalin Hawking, MD, 50 mg at 07/03/16 1029 .  multivitamin with minerals tablet 1 tablet, 1 tablet, Oral, Daily, Rosalin Hawking, MD, 1 tablet at 07/03/16 1029 .  oxyCODONE (Oxy IR/ROXICODONE) immediate release tablet 5 mg, 5 mg, Oral, Q6H PRN, Rosalin Hawking, MD, 5 mg at 07/02/16 1021 .  sucralfate (CARAFATE) 1 GM/10ML suspension 1 g, 1 g, Oral, Q6H, Anders Simmonds, MD, 1 g at  07/03/16 1204 .  thiamine (VITAMIN B-1) tablet 100 mg, 100 mg, Per Tube, Daily, Javier Glazier, MD, 100 mg at 07/03/16 1029  Patients Current Diet: DIET DYS 3 Room service appropriate? Yes; Fluid consistency: Thin  Precautions / Restrictions Precautions Precautions: Fall Restrictions Weight Bearing Restrictions: No   Has the patient had 2 or more falls or a fall with injury in the past year?Yes  Prior Activity Level Limited Community (1-2x/wk): Went out 2 X a week.  Not driving.  Walked or got a ride.  Home Assistive Devices / Equipment Home Assistive Devices/Equipment: Cane (specify quad or straight) Home Equipment: Cane -  single point, Grab bars - tub/shower, Hand held shower head, Walker - 2 wheels  Prior Device Use: Indicate devices/aids used by the patient prior to current illness, exacerbation or injury? Walker and [employer]  Prior Functional Level Prior Function Level of Independence: Independent with assistive device(s) Comments: Used SPC for mobility but recently has been using RW   Self Care: Did the patient need help bathing, dressing, using the toilet or eating?  Independent  Indoor Mobility: Did the patient need assistance with walking from room to room (with or without device)? Independent  Stairs: Did the patient need assistance with internal or external stairs (with or without device)? Independent  Functional Cognition: Did the patient need help planning regular tasks such as shopping or remembering to take medications? Independent  Current Functional Level Cognition  Arousal/Alertness: Awake/alert Overall Cognitive Status: Impaired/Different from baseline Orientation Level: Oriented X4 Following Commands: Follows multi-step commands inconsistently, Follows multi-step commands with increased time Safety/Judgement: Decreased awareness of safety, Decreased awareness of deficits General Comments: requires incr (A) for sequence of task and delayed  initiation Attention: Alternating Alternating Attention: Appears intact Memory: Appears intact Awareness: Impaired Awareness Impairment: Anticipatory impairment Problem Solving: Impaired Problem Solving Impairment: Functional basic    Extremity Assessment (includes Sensation/Coordination)  Upper Extremity Assessment: LUE deficits/detail LUE Deficits / Details: edema present   Lower Extremity Assessment: Defer to PT evaluation RLE Deficits / Details: +edema; AROM WFL; strength 5/5 RLE Sensation: decreased light touch, history of peripheral neuropathy LLE Deficits / Details: AROM WfL; hip flexion 3, hip extension 3+, knee extension 4, ankle DF 5 LLE Sensation: decreased light touch, history of peripheral neuropathy    ADLs  Overall ADL's : Needs assistance/impaired Grooming: Wash/dry hands, Wash/dry face, Sitting, Minimal assistance Toilet Transfer: +2 for physical assistance, Moderate assistance Functional mobility during ADLs: +2 for physical assistance, Minimal assistance General ADL Comments: pt requires cues for sequence and anterior weight shift. ( see tranfer section) pt states his wife Kendrick Fries is his POA    Mobility  Overal bed mobility: Needs Assistance Bed Mobility: Rolling, Sidelying to Sit Rolling: Mod assist Sidelying to sit: Max assist Sit to sidelying: Min assist General bed mobility comments: assist to mobilize bilat LE and rotate and elevate trunk into sitting; HOB elevated and use of rail    Transfers  Overall transfer level: Needs assistance Equipment used: Rolling walker (2 wheeled) Transfers: Sit to/from Stand, [employer] Transfers Sit to Stand: Mod assist, From elevated surface Stand pivot transfers: Mod assist, +2 physical assistance General transfer comment: from EOB and BSC; pt fatigued after sitting on BSC and had increased difficulty with advancing L LE and weight shifting; assist to power up into standing and maintain balance in standing; assist to  weight shift, mainatain L grip on RW, pivot L foot, and manage RW with pivot    Ambulation / Gait / Stairs / Wheelchair Mobility  Ambulation/Gait Ambulation/Gait assistance: Mod assist, +2 safety/equipment Ambulation Distance (Feet): 4 Feet Assistive device: Rolling walker (2 wheeled) Gait Pattern/deviations: Step-to pattern, Decreased step length - left, Decreased weight shift to right, Trunk flexed, Wide base of support General Gait Details: multimodal cues for posture, weight shifting, and advancing L LE; limited by pt requesting BSC and fatigue Gait velocity: decreased Gait velocity interpretation: Below normal speed for age/gender    Posture / Balance Balance Overall balance assessment: Needs assistance Sitting-balance support: Bilateral upper extremity supported, Feet supported Sitting balance-Leahy Scale: Fair Standing balance support: Bilateral upper extremity supported, During functional activity Standing balance-Leahy  Scale: Poor Standing balance comment: flexed posture with cues to facilite upright positioning required    Special needs/care consideration BiPAP/CPAP No CPM No Continuous Drip IV 0.9% NS 100 mL/hr Dialysis No       Life Vest No Oxygen No Special Bed No Trach Size No Wound Vac (area) No    Skin No                           Bowel mgmt: Having loose stools 07/03/16 Bladder mgmt: urinary catheter in place Diabetic mgmt No Enteric Precautions: Yes    Previous Home Environment Living Arrangements: Spouse/significant other  Lives With: Spouse Available Help at Discharge: Family, Available 24 hours/day Type of Home: House Home Layout: One level Home Access: Stairs to enter Entrance Stairs-Rails: Right, Left, Can reach both Entrance Stairs-Number of Steps: 4 Bathroom Shower/Tub: Multimedia programmer: Handicapped height Bathroom Accessibility: Yes Home Care Services: No Additional Comments: States his son coming from [place] to help out  as well  Discharge Living Setting Plans for Discharge Living Setting: Patient's home, House, Lives with (comment) (Lives with wife.) Type of Home at Discharge: House Discharge Home Layout: One level Discharge Home Access: Stairs to enter Entrance Stairs-Number of Steps: 3 Does the patient have any problems obtaining your medications?: No  Social/Family/Support Systems Patient Roles: Spouse, Parent (Has a wife, a son and 2 daughters.) Has a daughter here in [place], a daughter in [place] and a son in [place]. Contact Information: Kimothy Kishimoto - wife Anticipated Caregiver: wife Anticipated Caregiver's Contact Information: Kendrick Fries - wife [phone] Ability/Limitations of Caregiver: Wife does not work and can assist. Careers adviser: 24/7 Discharge Plan Discussed with Primary Caregiver: Yes Is Caregiver In Agreement with Plan?: Yes Does Caregiver/Family have Issues with Lodging/Transportation while Pt is in Rehab?: No  Goals/Additional Needs Patient/Family Goal for Rehab: PT/OT supervision to min assist, SLP mod I and supervision goals Expected length of stay: 20-24 days Cultural Considerations: None Dietary Needs: Dys 3, thin liquids Equipment Needs: TBD Pt/Family Agrees to Admission and willing to participate: Yes Program Orientation Provided & Reviewed with Pt/Caregiver Including Roles  & Responsibilities: Yes  Decrease burden of Care through IP rehab admission: N/A  Possible need for SNF placement upon discharge: Not planned  Patient Condition: This patient's medical and functional status has changed since the consult dated: 06/29/16 in which the Rehabilitation Physician determined and documented that the patient's condition is appropriate for intensive rehabilitative care in an inpatient rehabilitation facility. See "History of Present Illness" (above) for medical update. Functional changes are: Currently requiring mod assist to ambulate 4 feet RW.  Patient's  medical and functional status update has been discussed with the Rehabilitation physician and patient remains appropriate for inpatient rehabilitation. Will admit to inpatient rehab tomorrow.  Preadmission Screen Completed By:  [name], 07/03/2016 2:54 PM ______________________________________________________________________   Discussed status with Dr. Naaman Plummer on 07/03/16 at 1453 and received telephone approval for admission tomorrow.  Admission Coordinator:  [name], time1454/Date09/29/17

## 2016-07-03 NOTE — H&P (Signed)
Physical Medicine and Rehabilitation Admission H&P    Chief Complaint  Patient presents with  . Left sided weakness, left facial weakness and dysarthria    HPI:  Nathan Frank is a 75 y.o. RH-male with history of Afib, alcoholic cirrhosis with ascites/thrombocytopenia, GIB, polysubstance abuse, Hep C, hepatocellular CA, A fib (no anticoagulation due to non-compliance),  most recent admission for acute on chronic CHF. He was admitted on 06/25/16 after being found on the floor with left sided weakness, right gaze preference and facial droop with dysarthria.  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   He underwent cerebral angio with thrombectomy of R-MCA clot and IA Integrilin. 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 2D echo with EF 40-45% and mild MVR.    He tolerated extubation on 09/23 but noted to have weak cough with difficulty handling secretions. He developed fever and was started on IV Vanc/Zosyn.   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 Infarct felt to be embolic due to unknown source and patient placed on ASA for secondary stroke prevention. Swallow evaluation showed significant dysphagia which has improved and he was started on dysphagia 3, thin liquids on 09/28.  He did have an episode of Afib with RVR yesterday that resolved with addition of BB. Dr. Erlinda Hong recommends anticoagulation and patient to follow up with cardiology after discharge for input. Patient with resultant left sided weakness, dysarthria and cognitive deficits affecting mobility and self care.  CIR recommended   Review of Systems  HENT: Negative for hearing loss.   Eyes: Negative for blurred vision and double vision.  Respiratory: Negative  for cough and shortness of breath.   Cardiovascular: Negative for palpitations and leg swelling.  Gastrointestinal: Positive for constipation and heartburn. Negative for nausea and vomiting.  Genitourinary: Negative for frequency and urgency.  Musculoskeletal: Negative for back pain, myalgias and neck pain.  Skin: Negative for itching and rash.  Neurological: Positive for speech change, focal weakness and weakness. Negative for headaches.  Psychiatric/Behavioral: The patient is not nervous/anxious and does not have insomnia.       Past Medical History:  Diagnosis Date  . Abnormal TSH   . Acute gastric ulcer   . Acute gastritis with hemorrhage   . Alcohol abuse   . Alcohol dependence ([place]) 02/02/2014  . Anemia   . CHF (congestive heart failure) ([place])   . Cirrhosis ([place]) 06/04/2012  . Depressive disorder 02/01/2014  . Dizziness and giddiness 12/13/2014  . DVT of lower limb, acute ([place]) 06/06/2012  . Essential hypertension   . Fatty liver   . Gallstones   . Gastritis 06/07/2012  . GERD (gastroesophageal reflux disease)   . GI bleed due to NSAIDs 12/13/2014  . Granulomatous gastritis   . Hematuria 06/04/2012  . Hepatitis C   . Hepatocellular carcinoma ([place])   . Heroin abuse    "I haven't done that since I don't know when."  . Heroin overdose 02/20/2014  . LV dysfunction    a. 04/2015: EF 45-50% by cath.  . Neuropathy ([place])   . NSTEMI (non-ST elevated myocardial infarction) ([place])    a. 04/2015 - patent coronaries. Etiology possibly due to coronary spasm versus embolus, stress cardiomyopathy (atypical), and aborted infarction related to plaque rupture with thrombosis and dissolution. Amlodipine  started. Not on antiplatelets due to GIB/cirrhosis history.  . Oral thrush 06/05/2012  . Polysubstance abuse    Rare marijuana.  No EtOH x 2 months.   . Prolonged Q-T interval on ECG    a. 12/2014 - treated with magnesium.  . Right knee pain 12/13/2014  . S/P alcohol detoxification 02/02/2014  . SVT  (supraventricular tachycardia) ([place])    a. 12/2014 in setting of GIB, ETOH, NSAIDS, gastritis.  . Symptomatic cholelithiasis 12/15/2013  . Thrombocytopenia ([place])   . Upper GI bleeding 12/13/2014  . Weight loss 06/04/2012    Past Surgical History:  Procedure Laterality Date  . CARDIAC CATHETERIZATION N/A 04/15/2015   Procedure: Left Heart Cath and Coronary Angiography;  Surgeon: Belva Crome, MD;  Location: [facility];  Service: Cardiovascular;  Laterality: N/A;  . CIRCUMCISION    . ESOPHAGOGASTRODUODENOSCOPY  06/07/2012   Procedure: ESOPHAGOGASTRODUODENOSCOPY (EGD);  Surgeon: Milus Banister, MD;  Location: [facility];  Service: Endoscopy;  Laterality: N/A;  may need treatment of varices  . ESOPHAGOGASTRODUODENOSCOPY N/A 12/14/2014   Procedure: ESOPHAGOGASTRODUODENOSCOPY (EGD);  Surgeon: Jerene Bears, MD;  Location: [facility];  Service: Endoscopy;  Laterality: N/A;  . IR GENERIC HISTORICAL  06/25/2016   IR US GUIDE VASC ACCESS LEFT 06/25/2016 Corrie Mckusick, DO [facility]  . IR GENERIC HISTORICAL  06/25/2016   IR RADIOLOGY PERIPHERAL GUIDED IV START 06/25/2016 Corrie Mckusick, DO [facility]  . IR GENERIC HISTORICAL  06/25/2016   IR PERCUTANEOUS ART THROMBECTOMY/INFUSION INTRACRANIAL INC DIAG ANGIO 06/25/2016 Luanne Bras, MD [facility]  . RADIOLOGY WITH ANESTHESIA N/A 06/25/2016   Procedure: RADIOLOGY WITH ANESTHESIA;  Surgeon: Luanne Bras, MD;  Location: [facility];  Service: Radiology;  Laterality: N/A;  . TONSILLECTOMY     Family History  Problem Relation Age of Onset  . Stroke Father   . Prostate cancer Brother   . Heart disease Mother     Pacemaker    Social History:  Married--reports wife in good health. He retired 2 years ago?  Independent with cane?walker? PTA--sedentary. Per  reports that he has quit smoking 50 years ago. His smoking use included Cigarettes. He has a 2.50 pack-year smoking history. He has never used smokeless tobacco. He reports that he has not  had a drink in 2 months? history of drinking 16.8 oz of alcohol per week . History of heroin OD "years ago". He reports that he does not use drugs.    Allergies: No Known Allergies    Medications Prior to Admission  Medication Sig Dispense Refill  . aspirin EC 325 MG tablet Take 1 tablet (325 mg total) by mouth daily. 30 tablet 0  . Cholecalciferol (VITAMIN D PO) Take 1 tablet by mouth daily.    . folic acid (FOLVITE) 1 MG tablet Take 1 tablet (1 mg total) by mouth daily. 30 tablet 0  . furosemide (LASIX) 40 MG tablet Take 1 tablet (40 mg total) by mouth daily. 30 tablet 0  . levothyroxine (SYNTHROID, LEVOTHROID) 50 MCG tablet Take 1 tablet (50 mcg total) by mouth daily before breakfast. 30 tablet 0  . lipase/protease/amylase (CREON) 12000 units CPEP capsule Take 1 capsule (12,000 Units total) by mouth 3 (three) times daily with meals. (Patient not taking: Reported on 06/25/2016) 270 capsule 0  . magnesium oxide (MAG-OX) 400 (241.3 Mg) MG tablet Take 1 tablet (400 mg total) by mouth daily. 30 tablet 0  . metoprolol tartrate (LOPRESSOR) 25 MG tablet Take 1 tablet (25 mg total) by  mouth 3 (three) times daily. 90 tablet 0  . Multiple Vitamin (MULTIVITAMIN WITH MINERALS) TABS tablet Take 1 tablet by mouth daily. 30 tablet 0  . potassium chloride (K-DUR) 10 MEQ tablet Take 2 tablets (20 mEq total) by mouth daily. 60 tablet 0  . saccharomyces boulardii (FLORASTOR) 250 MG capsule Take 1 capsule (250 mg total) by mouth 2 (two) times daily. (Patient not taking: Reported on 06/25/2016) 60 capsule 0  . spironolactone (ALDACTONE) 25 MG tablet Take 0.5 tablets (12.5 mg total) by mouth daily. 30 tablet 0  . sucralfate (CARAFATE) 1 g tablet Take 1 tablet (1 g total) by mouth 2 (two) times daily. 60 tablet 0  . thiamine 100 MG tablet Take 1 tablet (100 mg total) by mouth daily. (Patient not taking: Reported on 05/27/2016) 30 tablet 0  . zolpidem (AMBIEN) 5 MG tablet Take 1 tablet (5 mg total) by mouth at bedtime  as needed for sleep. 30 tablet 0    Home: Home Living Family/patient expects to be discharged to:: Private residence Living Arrangements: Spouse/significant other Available Help at Discharge: Family, Available 24 hours/day Type of Home: House Home Access: Stairs to enter [employer] of Steps: 4 Entrance Stairs-Rails: Right, Left, Can reach both Home Layout: One level Bathroom Shower/Tub: Multimedia programmer: Handicapped height Bathroom Accessibility: Yes Home Equipment: Cane - single point, Grab bars - tub/shower, Hand held shower head, Walker - 2 wheels Additional Comments: States his son coming from [place] to help out as well  Lives With: Spouse   Functional History: Prior Function Level of Independence: Independent with assistive device(s) Comments: Used SPC for mobility but recently has been using RW   Functional Status:  Mobility: Bed Mobility Overal bed mobility: Needs Assistance Bed Mobility: Rolling, Sidelying to Sit Rolling: Mod assist Sidelying to sit: Max assist Sit to sidelying: Min assist General bed mobility comments: assist to mobilize bilat LE and rotate and elevate trunk into sitting; HOB elevated and use of rail Transfers Overall transfer level: Needs assistance Equipment used: Rolling walker (2 wheeled) Transfers: Sit to/from Stand, [employer] Transfers Sit to Stand: Mod assist, From elevated surface Stand pivot transfers: Mod assist, +2 physical assistance General transfer comment: from EOB and BSC; pt fatigued after sitting on BSC and had increased difficulty with advancing L LE and weight shifting; assist to power up into standing and maintain balance in standing; assist to weight shift, mainatain L grip on RW, pivot L foot, and manage RW with pivot Ambulation/Gait Ambulation/Gait assistance: Mod assist, +2 safety/equipment Ambulation Distance (Feet): 4 Feet Assistive device: Rolling walker (2 wheeled) Gait  Pattern/deviations: Step-to pattern, Decreased step length - left, Decreased weight shift to right, Trunk flexed, Wide base of support General Gait Details: multimodal cues for posture, weight shifting, and advancing L LE; limited by pt requesting BSC and fatigue Gait velocity: decreased Gait velocity interpretation: Below normal speed for age/gender    ADL: ADL Overall ADL's : Needs assistance/impaired Grooming: Wash/dry hands, Wash/dry face, Sitting, Minimal assistance Toilet Transfer: +2 for physical assistance, Moderate assistance Functional mobility during ADLs: +2 for physical assistance, Minimal assistance General ADL Comments: pt requires cues for sequence and anterior weight shift. ( see tranfer section) pt states his wife Kendrick Fries is his POA  Cognition: Cognition Overall Cognitive Status: Impaired/Different from baseline Arousal/Alertness: Awake/alert Orientation Level: Oriented X4 Attention: Alternating Alternating Attention: Appears intact Memory: Appears intact Awareness: Impaired Awareness Impairment: Anticipatory impairment Problem Solving: Impaired Problem Solving Impairment: Functional basic Cognition Arousal/Alertness: Awake/alert Behavior During  Therapy: WFL for tasks assessed/performed Overall Cognitive Status: Impaired/Different from baseline Area of Impairment: Following commands, Problem solving Orientation Level:  (oriented to name, date, place, stroke) Memory: Decreased short-term memory Following Commands: Follows multi-step commands inconsistently, Follows multi-step commands with increased time Safety/Judgement: Decreased awareness of safety, Decreased awareness of deficits Awareness: Emergent Problem Solving: Slow processing, Decreased initiation, Difficulty sequencing, Requires verbal cues, Requires tactile cues General Comments: requires incr (A) for sequence of task and delayed initiation   Blood pressure 125/90, pulse 79, temperature 97.7 F  (36.5 C), temperature source Oral, resp. rate 16, height 6' (1.829 m), weight 103.8 kg (228 lb 14.4 oz), SpO2 100 %. Physical Exam  Nursing note and vitals reviewed. Constitutional: He is oriented to person, place, and time. He appears well-developed and well-nourished.  HENT:  Head: Normocephalic and atraumatic.  Eyes: Conjunctivae are normal. Pupils are equal, round, and reactive to light.  Neck: Normal range of motion. Neck supple.  Cardiovascular: Normal rate.  An irregular rhythm present. Exam reveals no gallop and no friction rub.   No murmur heard. Respiratory: Effort normal. No stridor. No respiratory distress. He has no wheezes. He has rhonchi. He has no rales.  GI: Soft. Bowel sounds are increased. There is no tenderness. There is no rebound and no guarding.  Mild distention  Neurological: He is alert and oriented to person, place, and time.  Left facial weakness with minimal dysarthria and occasional wet voice. Flat affect with delayed processing and slow movements.Fair insight and awareness. He is able to follow simple one and two step motor commands.  Left sided weakness: LUW 2/5 deltoid, bicep, tricep, wrist, hand, 1+ to 2-/5 HF, KE, ADF/PF. Senses pain Left arm and leg.   Skin: Skin is warm and dry. No rash noted.  BLE with dry flaky skin.  Psychiatric: His affect is blunt. His speech is delayed. He is slowed.    Results for orders placed or performed during the hospital encounter of 06/25/16 (from the past 48 hour(s))  CBC     Status: Abnormal   Collection Time: 07/03/16  4:16 AM  Result Value Ref Range   WBC 8.2 4.0 - 10.5 K/uL   RBC 2.65 (L) 4.22 - 5.81 MIL/uL   Hemoglobin 8.5 (L) 13.0 - 17.0 g/dL   HCT 25.6 (L) 39.0 - 52.0 %   MCV 96.6 78.0 - 100.0 fL   MCH 32.1 26.0 - 34.0 pg   MCHC 33.2 30.0 - 36.0 g/dL   RDW 17.1 (H) 11.5 - 15.5 %   Platelets 179 150 - 400 K/uL  Basic metabolic panel     Status: Abnormal   Collection Time: 07/03/16  4:16 AM  Result Value Ref  Range   Sodium 140 135 - 145 mmol/L   Potassium 3.3 (L) 3.5 - 5.1 mmol/L   Chloride 118 (H) 101 - 111 mmol/L   CO2 17 (L) 22 - 32 mmol/L   Glucose, Bld 97 65 - 99 mg/dL   BUN 19 6 - 20 mg/dL   Creatinine, Ser 1.72 (H) 0.61 - 1.24 mg/dL   Calcium 8.3 (L) 8.9 - 10.3 mg/dL   GFR calc non Af Amer 37 (L) >60 mL/min   GFR calc Af Amer 43 (L) >60 mL/min    Comment: (NOTE) The eGFR has been calculated using the CKD EPI equation. This calculation has not been validated in all clinical situations. eGFR's persistently <60 mL/min signify possible Chronic Kidney Disease.    Anion gap 5 5 - 15  Magnesium     Status: Abnormal   Collection Time: 07/03/16  4:16 AM  Result Value Ref Range   Magnesium 1.6 (L) 1.7 - 2.4 mg/dL  Ferritin     Status: Abnormal   Collection Time: 07/03/16 10:51 AM  Result Value Ref Range   Ferritin 499 (H) 24 - 336 ng/mL  Iron and TIBC     Status: Abnormal   Collection Time: 07/03/16 10:51 AM  Result Value Ref Range   Iron 34 (L) 45 - 182 ug/dL   TIBC 207 (L) 250 - 450 ug/dL   Saturation Ratios 16 (L) 17.9 - 39.5 %   UIBC 173 ug/dL  Occult blood card to lab, stool     Status: None   Collection Time: 07/03/16  2:49 PM  Result Value Ref Range   Fecal Occult Bld NEGATIVE NEGATIVE  C difficile quick scan w PCR reflex     Status: None   Collection Time: 07/03/16  2:49 PM  Result Value Ref Range   C Diff antigen NEGATIVE NEGATIVE   C Diff toxin NEGATIVE NEGATIVE   C Diff interpretation No C. difficile detected.   Glucose, capillary     Status: Abnormal   Collection Time: 07/03/16  5:51 PM  Result Value Ref Range   Glucose-Capillary 109 (H) 65 - 99 mg/dL   Comment 1 Notify RN    Comment 2 Document in Chart   Glucose, capillary     Status: None   Collection Time: 07/03/16 10:48 PM  Result Value Ref Range   Glucose-Capillary 97 65 - 99 mg/dL   Comment 1 Notify RN    Comment 2 Document in Chart   CBC     Status: Abnormal   Collection Time: 07/04/16  6:49 AM    Result Value Ref Range   WBC 6.9 4.0 - 10.5 K/uL   RBC 2.95 (L) 4.22 - 5.81 MIL/uL   Hemoglobin 9.5 (L) 13.0 - 17.0 g/dL   HCT 28.7 (L) 39.0 - 52.0 %   MCV 97.3 78.0 - 100.0 fL   MCH 32.2 26.0 - 34.0 pg   MCHC 33.1 30.0 - 36.0 g/dL   RDW 17.4 (H) 11.5 - 15.5 %   Platelets 174 150 - 400 K/uL  Basic metabolic panel     Status: Abnormal   Collection Time: 07/04/16  6:49 AM  Result Value Ref Range   Sodium 142 135 - 145 mmol/L   Potassium 3.1 (L) 3.5 - 5.1 mmol/L   Chloride 119 (H) 101 - 111 mmol/L   CO2 16 (L) 22 - 32 mmol/L   Glucose, Bld 95 65 - 99 mg/dL   BUN 21 (H) 6 - 20 mg/dL   Creatinine, Ser 1.68 (H) 0.61 - 1.24 mg/dL   Calcium 8.3 (L) 8.9 - 10.3 mg/dL   GFR calc non Af Amer 38 (L) >60 mL/min   GFR calc Af Amer 44 (L) >60 mL/min    Comment: (NOTE) The eGFR has been calculated using the CKD EPI equation. This calculation has not been validated in all clinical situations. eGFR's persistently <60 mL/min signify possible Chronic Kidney Disease.    Anion gap 7 5 - 15  Glucose, capillary     Status: None   Collection Time: 07/04/16  6:49 AM  Result Value Ref Range   Glucose-Capillary 91 65 - 99 mg/dL   Comment 1 Notify RN    Comment 2 Document in Chart        Medical Problem List and  Plan: 1.  Left hemiparesis and funtctional deficits secondary to right MCA embolic infarct 2.  DVT Prophylaxis/Anticoagulation: Pharmaceutical: Lovenox 3. Pain Management: tylenol prn. Support limbs as needed to maintain joint position and prevent contracture 4. Mood: team to follow for ego support. SW to assess upon admit.  5. Neuropsych: This patient is not yet capable of making decisions on his own behalf. 6. Skin/Wound Care: encourage nutrition and pressure relief as needed 7. Fluids/Electrolytes/Nutrition: encourage PO. Check electrolytes upon admit 8. Afib: ASA daily  -metoprolol increased to 49m BID for better rate control  -observe patient's tolerance with therapy  -adjust  regimen as needed 9. AKI: responding to IV hydration  -Labs stable today  -re-check BMET in AM 10. Hypokalemia: continue to replete potassium  -recheck level in AM 11. Anemia:  Hgb 14.1 at admission down to --->8.5 yesterday. Back to 9.5 today  -labs indicate anemia of chronic disease  -stool hemoccult negative  -encourage appropriate nutrition  12. Cirrhosis of Liver.  -monitor LFT's and ammonia as needed   -labs recently improved 13. HTN--metoprolol     Post Admission Physician Evaluation: 1. Functional deficits secondary  to Right MCA infarct. 2. Patient is admitted to receive collaborative, interdisciplinary care between the physiatrist, rehab nursing staff, and therapy team. 3. Patient's level of medical complexity and substantial therapy needs in context of that medical necessity cannot be provided at a lesser intensity of care such as a SNF. 4. Patient has experienced substantial functional loss from his/her baseline which was documented above under the "Functional History" and "Functional Status" headings.  Judging by the patient's diagnosis, physical exam, and functional history, the patient has potential for functional progress which will result in measurable gains while on inpatient rehab.  These gains will be of substantial and practical use upon discharge  in facilitating mobility and self-care at the household level. 5. Physiatrist will provide 24 hour management of medical needs as well as oversight of the therapy plan/treatment and provide guidance as appropriate regarding the interaction of the two. 6. 24 hour rehab nursing will assist with bladder management, bowel management, safety, skin/wound care, disease management, medication administration, pain management and patient education  and help integrate therapy concepts, techniques,education, etc. 7. PT will assess and treat for/with: Lower extremity strength, range of motion, stamina, balance, functional mobility, safety,  adaptive techniques and equipment, NMR, visual-spatial awareness, family education.   Goals are: supervision to mod I. 8. OT will assess and treat for/with: ADL's, functional mobility, safety, upper extremity strength, adaptive techniques and equipment, NMR, family education, community reintegration, ego support.   Goals are: supervision to mod I. Therapy may proceed with showering this patient. 9. SLP will assess and treat for/with: cognition, communication, education.  Goals are: mod I to supervision. 10. Case Management and Social Worker will assess and treat for psychological issues and discharge planning. 11. Team conference will be held weekly to assess progress toward goals and to determine barriers to discharge. 12. Patient will receive at least 3 hours of therapy per day at least 5 days per week. 13. ELOS: 15-19 days       14. Prognosis:  excellent     ZMeredith Staggers MD, [facility] 07/04/2016  07/04/2016

## 2016-07-04 ENCOUNTER — Inpatient Hospital Stay (HOSPITAL_COMMUNITY)
Admission: RE | Admit: 2016-07-04 | Discharge: 2016-07-30 | DRG: 056 | Disposition: A | Discharge status: Home Health | Payer: Medicare Other | Source: Intra-hospital | Attending: Physical Medicine & Rehabilitation | Admitting: Physical Medicine & Rehabilitation

## 2016-07-04 DIAGNOSIS — I63413 Cerebral infarction due to embolism of bilateral middle cerebral arteries: Secondary | ICD-10-CM | POA: Diagnosis not present

## 2016-07-04 DIAGNOSIS — Z9119 Patient's noncompliance with other medical treatment and regimen: Secondary | ICD-10-CM

## 2016-07-04 DIAGNOSIS — D62 Acute posthemorrhagic anemia: Secondary | ICD-10-CM

## 2016-07-04 DIAGNOSIS — Z23 Encounter for immunization: Secondary | ICD-10-CM

## 2016-07-04 DIAGNOSIS — D72829 Elevated white blood cell count, unspecified: Secondary | ICD-10-CM

## 2016-07-04 DIAGNOSIS — Z09 Encounter for follow-up examination after completed treatment for conditions other than malignant neoplasm: Secondary | ICD-10-CM

## 2016-07-04 DIAGNOSIS — I110 Hypertensive heart disease with heart failure: Secondary | ICD-10-CM

## 2016-07-04 DIAGNOSIS — N179 Acute kidney failure, unspecified: Secondary | ICD-10-CM

## 2016-07-04 DIAGNOSIS — I501 Left ventricular failure: Secondary | ICD-10-CM

## 2016-07-04 DIAGNOSIS — K7031 Alcoholic cirrhosis of liver with ascites: Secondary | ICD-10-CM

## 2016-07-04 DIAGNOSIS — D638 Anemia in other chronic diseases classified elsewhere: Secondary | ICD-10-CM | POA: Diagnosis present

## 2016-07-04 DIAGNOSIS — Z79899 Other long term (current) drug therapy: Secondary | ICD-10-CM | POA: Diagnosis not present

## 2016-07-04 DIAGNOSIS — G8194 Hemiplegia, unspecified affecting left nondominant side: Secondary | ICD-10-CM | POA: Diagnosis not present

## 2016-07-04 DIAGNOSIS — G4700 Insomnia, unspecified: Secondary | ICD-10-CM | POA: Diagnosis not present

## 2016-07-04 DIAGNOSIS — Z9114 Patient's other noncompliance with medication regimen: Secondary | ICD-10-CM

## 2016-07-04 DIAGNOSIS — I471 Supraventricular tachycardia: Secondary | ICD-10-CM

## 2016-07-04 DIAGNOSIS — I252 Old myocardial infarction: Secondary | ICD-10-CM

## 2016-07-04 DIAGNOSIS — Z87891 Personal history of nicotine dependence: Secondary | ICD-10-CM

## 2016-07-04 DIAGNOSIS — I10 Essential (primary) hypertension: Secondary | ICD-10-CM | POA: Diagnosis present

## 2016-07-04 DIAGNOSIS — I69392 Facial weakness following cerebral infarction: Secondary | ICD-10-CM | POA: Diagnosis not present

## 2016-07-04 DIAGNOSIS — D72820 Lymphocytosis (symptomatic): Secondary | ICD-10-CM

## 2016-07-04 DIAGNOSIS — I69322 Dysarthria following cerebral infarction: Secondary | ICD-10-CM

## 2016-07-04 DIAGNOSIS — C228 Malignant neoplasm of liver, primary, unspecified as to type: Secondary | ICD-10-CM

## 2016-07-04 DIAGNOSIS — I509 Heart failure, unspecified: Secondary | ICD-10-CM | POA: Diagnosis not present

## 2016-07-04 DIAGNOSIS — E876 Hypokalemia: Secondary | ICD-10-CM

## 2016-07-04 DIAGNOSIS — B1920 Unspecified viral hepatitis C without hepatic coma: Secondary | ICD-10-CM

## 2016-07-04 DIAGNOSIS — I63411 Cerebral infarction due to embolism of right middle cerebral artery: Secondary | ICD-10-CM | POA: Diagnosis not present

## 2016-07-04 DIAGNOSIS — I63511 Cerebral infarction due to unspecified occlusion or stenosis of right middle cerebral artery: Secondary | ICD-10-CM

## 2016-07-04 DIAGNOSIS — I69319 Unspecified symptoms and signs involving cognitive functions following cerebral infarction: Secondary | ICD-10-CM

## 2016-07-04 DIAGNOSIS — I5033 Acute on chronic diastolic (congestive) heart failure: Secondary | ICD-10-CM

## 2016-07-04 DIAGNOSIS — R0602 Shortness of breath: Secondary | ICD-10-CM | POA: Diagnosis not present

## 2016-07-04 DIAGNOSIS — I69354 Hemiplegia and hemiparesis following cerebral infarction affecting left non-dominant side: Principal | ICD-10-CM

## 2016-07-04 DIAGNOSIS — I4891 Unspecified atrial fibrillation: Secondary | ICD-10-CM

## 2016-07-04 DIAGNOSIS — K7030 Alcoholic cirrhosis of liver without ascites: Secondary | ICD-10-CM | POA: Diagnosis present

## 2016-07-04 DIAGNOSIS — J811 Chronic pulmonary edema: Secondary | ICD-10-CM | POA: Diagnosis not present

## 2016-07-04 DIAGNOSIS — Z7982 Long term (current) use of aspirin: Secondary | ICD-10-CM | POA: Diagnosis not present

## 2016-07-04 DIAGNOSIS — I4719 Other supraventricular tachycardia: Secondary | ICD-10-CM

## 2016-07-04 DIAGNOSIS — Z86718 Personal history of other venous thrombosis and embolism: Secondary | ICD-10-CM | POA: Diagnosis not present

## 2016-07-04 DIAGNOSIS — R0609 Other forms of dyspnea: Secondary | ICD-10-CM

## 2016-07-04 LAB — CBC
HCT: 28.7 % — ABNORMAL LOW (ref 39.0–52.0)
Hemoglobin: 9.5 g/dL — ABNORMAL LOW (ref 13.0–17.0)
MCH: 32.2 pg (ref 26.0–34.0)
MCHC: 33.1 g/dL (ref 30.0–36.0)
MCV: 97.3 fL (ref 78.0–100.0)
PLATELETS: 174 10*3/uL (ref 150–400)
RBC: 2.95 MIL/uL — ABNORMAL LOW (ref 4.22–5.81)
RDW: 17.4 % — AB (ref 11.5–15.5)
WBC: 6.9 10*3/uL (ref 4.0–10.5)

## 2016-07-04 LAB — BASIC METABOLIC PANEL
ANION GAP: 7 (ref 5–15)
BUN: 21 mg/dL — ABNORMAL HIGH (ref 6–20)
CALCIUM: 8.3 mg/dL — AB (ref 8.9–10.3)
CO2: 16 mmol/L — AB (ref 22–32)
CREATININE: 1.68 mg/dL — AB (ref 0.61–1.24)
Chloride: 119 mmol/L — ABNORMAL HIGH (ref 101–111)
GFR, EST AFRICAN AMERICAN: 44 mL/min — AB (ref >60)
GFR, EST NON AFRICAN AMERICAN: 38 mL/min — AB (ref >60)
GLUCOSE: 95 mg/dL (ref 65–99)
Potassium: 3.1 mmol/L — ABNORMAL LOW (ref 3.5–5.1)
Sodium: 142 mmol/L (ref 135–145)

## 2016-07-04 LAB — GLUCOSE, CAPILLARY: Glucose-Capillary: 91 mg/dL (ref 65–99)

## 2016-07-04 MED ORDER — SUCRALFATE 1 GM/10ML PO SUSP
1.0000 g | Freq: Three times a day (TID) | ORAL | Status: DC
  Administered 2016-07-04 – 2016-07-13 (×33): 1 g via ORAL
  Filled 2016-07-04 (×38): qty 10

## 2016-07-04 MED ORDER — ADULT MULTIVITAMIN W/MINERALS CH
1.0000 | ORAL_TABLET | Freq: Every day | ORAL | Status: DC
  Administered 2016-07-05 – 2016-07-30 (×26): 1 via ORAL
  Filled 2016-07-04 (×26): qty 1

## 2016-07-04 MED ORDER — TRAZODONE HCL 50 MG PO TABS: 25.0000 mg | ORAL_TABLET | Freq: Every evening | ORAL | Status: DC | PRN

## 2016-07-04 MED ORDER — OXYCODONE HCL 5 MG PO TABS: 5.0000 mg | ORAL_TABLET | Freq: Four times a day (QID) | ORAL | Status: DC | PRN

## 2016-07-04 MED ORDER — DIPHENHYDRAMINE HCL 12.5 MG/5ML PO ELIX: 12.5000 mg | ORAL_SOLUTION | Freq: Four times a day (QID) | ORAL | Status: DC | PRN

## 2016-07-04 MED ORDER — CHLORHEXIDINE GLUCONATE 0.12 % MT SOLN
15.0000 mL | Freq: Two times a day (BID) | OROMUCOSAL | Status: DC
  Administered 2016-07-04 – 2016-07-30 (×45): 15 mL via OROMUCOSAL
  Filled 2016-07-04 (×50): qty 15

## 2016-07-04 MED ORDER — PROCHLORPERAZINE 25 MG RE SUPP: 12.5000 mg | Freq: Four times a day (QID) | RECTAL | Status: DC | PRN

## 2016-07-04 MED ORDER — FLEET ENEMA 7-19 GM/118ML RE ENEM: 1.0000 | ENEMA | Freq: Once | RECTAL | Status: DC | PRN

## 2016-07-04 MED ORDER — POTASSIUM CHLORIDE CRYS ER 20 MEQ PO TBCR: 20.0000 meq | EXTENDED_RELEASE_TABLET | Freq: Two times a day (BID) | ORAL | Status: DC

## 2016-07-04 MED ORDER — ENOXAPARIN SODIUM 40 MG/0.4ML ~~LOC~~ SOLN
40.0000 mg | SUBCUTANEOUS | Status: DC
  Administered 2016-07-04 – 2016-07-29 (×26): 40 mg via SUBCUTANEOUS
  Filled 2016-07-04 (×27): qty 0.4

## 2016-07-04 MED ORDER — GUAIFENESIN-DM 100-10 MG/5ML PO SYRP: 5.0000 mL | ORAL_SOLUTION | Freq: Four times a day (QID) | ORAL | Status: DC | PRN

## 2016-07-04 MED ORDER — SODIUM CHLORIDE 0.45 % IV SOLN
INTRAVENOUS | Status: DC
  Administered 2016-07-04 – 2016-07-06 (×2): via INTRAVENOUS
  Administered 2016-07-06: 75 mL/h via INTRAVENOUS
  Administered 2016-07-07: 07:00:00 via INTRAVENOUS

## 2016-07-04 MED ORDER — ALUM & MAG HYDROXIDE-SIMETH 200-200-20 MG/5ML PO SUSP: 30.0000 mL | ORAL | Status: DC | PRN

## 2016-07-04 MED ORDER — LEVOTHYROXINE SODIUM 50 MCG PO TABS
50.0000 ug | ORAL_TABLET | Freq: Every day | ORAL | Status: DC
  Administered 2016-07-05 – 2016-07-30 (×26): 50 ug
  Filled 2016-07-04 (×27): qty 1

## 2016-07-04 MED ORDER — VITAMIN B-1 100 MG PO TABS
100.0000 mg | ORAL_TABLET | Freq: Every day | ORAL | Status: DC
  Administered 2016-07-05 – 2016-07-30 (×26): 100 mg
  Filled 2016-07-04 (×27): qty 1

## 2016-07-04 MED ORDER — ASPIRIN 325 MG PO TABS
325.0000 mg | ORAL_TABLET | Freq: Every day | ORAL | Status: DC
  Administered 2016-07-05 – 2016-07-30 (×26): 325 mg via ORAL
  Filled 2016-07-04 (×26): qty 1

## 2016-07-04 MED ORDER — ACETAMINOPHEN 325 MG PO TABS
325.0000 mg | ORAL_TABLET | ORAL | Status: DC | PRN
  Administered 2016-07-23 – 2016-07-29 (×3): 650 mg via ORAL
  Filled 2016-07-04 (×3): qty 2

## 2016-07-04 MED ORDER — PROCHLORPERAZINE EDISYLATE 5 MG/ML IJ SOLN: 5.0000 mg | Freq: Four times a day (QID) | INTRAMUSCULAR | Status: DC | PRN

## 2016-07-04 MED ORDER — METOPROLOL TARTRATE 50 MG PO TABS
50.0000 mg | ORAL_TABLET | Freq: Two times a day (BID) | ORAL | Status: DC
  Administered 2016-07-04 – 2016-07-11 (×15): 50 mg via ORAL
  Administered 2016-07-12: 25 mg via ORAL
  Administered 2016-07-12 – 2016-07-21 (×17): 50 mg via ORAL
  Filled 2016-07-04 (×34): qty 1

## 2016-07-04 MED ORDER — BISACODYL 10 MG RE SUPP
10.0000 mg | Freq: Every day | RECTAL | Status: DC | PRN
  Administered 2016-07-07 – 2016-07-20 (×2): 10 mg via RECTAL
  Filled 2016-07-04 (×3): qty 1

## 2016-07-04 MED ORDER — PROCHLORPERAZINE MALEATE 5 MG PO TABS: 5.0000 mg | ORAL_TABLET | Freq: Four times a day (QID) | ORAL | Status: DC | PRN

## 2016-07-04 NOTE — Progress Notes (Signed)
Pt discharge education and instructions completed with pt. Report called off to Vivinne RN on 4w; pt transported off unit via bed with belongings and spouse to the side. Delia Heady RN

## 2016-07-04 NOTE — Interval H&P Note (Signed)
Nathan Frank was admitted today to Inpatient Rehabilitation with the diagnosis of embolic right MCA infarct.  The patient's history has been reviewed, patient examined, and there is no change in status.  Patient continues to be appropriate for intensive inpatient rehabilitation.  I have reviewed the patient's chart and labs.  Questions were answered to the patient's satisfaction. The PAPE has been reviewed and assessment remains appropriate.  Lareina Espino T 07/04/2016, 6:18 PM

## 2016-07-04 NOTE — Progress Notes (Signed)
Rehab admissions - Patient has been cleared by Dr. Naaman Plummer and by Dr. Erlinda Hong for acute inpatient rehab admission for today.  Bed available and will admit today.  Call me for questions.  RC:[phone]

## 2016-07-04 NOTE — H&P (View-Only) (Signed)
Physical Medicine and Rehabilitation Admission H&P    Chief Complaint  Patient presents with  . Left sided weakness, left facial weakness and dysarthria    HPI:  ANTWONE CAPOZZOLI is a 75 y.o. RH-male with history of Afib, alcoholic cirrhosis with ascites/thrombocytopenia, GIB, polysubstance abuse, Hep C, hepatocellular CA, A fib (no anticoagulation due to non-compliance),  most recent admission for acute on chronic CHF. He was admitted on 06/25/16 after being found on the floor with left sided weakness, right gaze preference and facial droop with dysarthria.  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   He underwent cerebral angio with thrombectomy of R-MCA clot and IA Integrilin. 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 2D echo with EF 40-45% and mild MVR.    He tolerated extubation on 09/23 but noted to have weak cough with difficulty handling secretions. He developed fever and was started on IV Vanc/Zosyn.   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 Infarct felt to be embolic due to unknown source and patient placed on ASA for secondary stroke prevention. Swallow evaluation showed significant dysphagia which has improved and he was started on dysphagia 3, thin liquids on 09/28.  He did have an episode of Afib with RVR yesterday that resolved with addition of BB. Dr. Erlinda Hong recommends anticoagulation and patient to follow up with cardiology after discharge for input. Patient with resultant left sided weakness, dysarthria and cognitive deficits affecting mobility and self care.  CIR recommended   Review of Systems  HENT: Negative for hearing loss.   Eyes: Negative for blurred vision and double vision.  Respiratory: Negative  for cough and shortness of breath.   Cardiovascular: Negative for palpitations and leg swelling.  Gastrointestinal: Positive for constipation and heartburn. Negative for nausea and vomiting.  Genitourinary: Negative for frequency and urgency.  Musculoskeletal: Negative for back pain, myalgias and neck pain.  Skin: Negative for itching and rash.  Neurological: Positive for speech change, focal weakness and weakness. Negative for headaches.  Psychiatric/Behavioral: The patient is not nervous/anxious and does not have insomnia.       Past Medical History:  Diagnosis Date  . Abnormal TSH   . Acute gastric ulcer   . Acute gastritis with hemorrhage   . Alcohol abuse   . Alcohol dependence ([place]) 02/02/2014  . Anemia   . CHF (congestive heart failure) ([place])   . Cirrhosis ([place]) 06/04/2012  . Depressive disorder 02/01/2014  . Dizziness and giddiness 12/13/2014  . DVT of lower limb, acute ([place]) 06/06/2012  . Essential hypertension   . Fatty liver   . Gallstones   . Gastritis 06/07/2012  . GERD (gastroesophageal reflux disease)   . GI bleed due to NSAIDs 12/13/2014  . Granulomatous gastritis   . Hematuria 06/04/2012  . Hepatitis C   . Hepatocellular carcinoma ([place])   . Heroin abuse    "I haven't done that since I don't know when."  . Heroin overdose 02/20/2014  . LV dysfunction    a. 04/2015: EF 45-50% by cath.  . Neuropathy ([place])   . NSTEMI (non-ST elevated myocardial infarction) ([place])    a. 04/2015 - patent coronaries. Etiology possibly due to coronary spasm versus embolus, stress cardiomyopathy (atypical), and aborted infarction related to plaque rupture with thrombosis and dissolution. Amlodipine  started. Not on antiplatelets due to GIB/cirrhosis history.  . Oral thrush 06/05/2012  . Polysubstance abuse    Rare marijuana.  No EtOH x 2 months.   . Prolonged Q-T interval on ECG    a. 12/2014 - treated with magnesium.  . Right knee pain 12/13/2014  . S/P alcohol detoxification 02/02/2014  . SVT  (supraventricular tachycardia) ([place])    a. 12/2014 in setting of GIB, ETOH, NSAIDS, gastritis.  . Symptomatic cholelithiasis 12/15/2013  . Thrombocytopenia ([place])   . Upper GI bleeding 12/13/2014  . Weight loss 06/04/2012    Past Surgical History:  Procedure Laterality Date  . CARDIAC CATHETERIZATION N/A 04/15/2015   Procedure: Left Heart Cath and Coronary Angiography;  Surgeon: Belva Crome, MD;  Location: [facility];  Service: Cardiovascular;  Laterality: N/A;  . CIRCUMCISION    . ESOPHAGOGASTRODUODENOSCOPY  06/07/2012   Procedure: ESOPHAGOGASTRODUODENOSCOPY (EGD);  Surgeon: Milus Banister, MD;  Location: [facility];  Service: Endoscopy;  Laterality: N/A;  may need treatment of varices  . ESOPHAGOGASTRODUODENOSCOPY N/A 12/14/2014   Procedure: ESOPHAGOGASTRODUODENOSCOPY (EGD);  Surgeon: Jerene Bears, MD;  Location: [facility];  Service: Endoscopy;  Laterality: N/A;  . IR GENERIC HISTORICAL  06/25/2016   IR US GUIDE VASC ACCESS LEFT 06/25/2016 Corrie Mckusick, DO [facility]  . IR GENERIC HISTORICAL  06/25/2016   IR RADIOLOGY PERIPHERAL GUIDED IV START 06/25/2016 Corrie Mckusick, DO [facility]  . IR GENERIC HISTORICAL  06/25/2016   IR PERCUTANEOUS ART THROMBECTOMY/INFUSION INTRACRANIAL INC DIAG ANGIO 06/25/2016 Luanne Bras, MD [facility]  . RADIOLOGY WITH ANESTHESIA N/A 06/25/2016   Procedure: RADIOLOGY WITH ANESTHESIA;  Surgeon: Luanne Bras, MD;  Location: [facility];  Service: Radiology;  Laterality: N/A;  . TONSILLECTOMY     Family History  Problem Relation Age of Onset  . Stroke Father   . Prostate cancer Brother   . Heart disease Mother     Pacemaker    Social History:  Married--reports wife in good health. He retired 2 years ago?  Independent with cane?walker? PTA--sedentary. Per  reports that he has quit smoking 50 years ago. His smoking use included Cigarettes. He has a 2.50 pack-year smoking history. He has never used smokeless tobacco. He reports that he has not  had a drink in 2 months? history of drinking 16.8 oz of alcohol per week . History of heroin OD "years ago". He reports that he does not use drugs.    Allergies: No Known Allergies    Medications Prior to Admission  Medication Sig Dispense Refill  . aspirin EC 325 MG tablet Take 1 tablet (325 mg total) by mouth daily. 30 tablet 0  . Cholecalciferol (VITAMIN D PO) Take 1 tablet by mouth daily.    . folic acid (FOLVITE) 1 MG tablet Take 1 tablet (1 mg total) by mouth daily. 30 tablet 0  . furosemide (LASIX) 40 MG tablet Take 1 tablet (40 mg total) by mouth daily. 30 tablet 0  . levothyroxine (SYNTHROID, LEVOTHROID) 50 MCG tablet Take 1 tablet (50 mcg total) by mouth daily before breakfast. 30 tablet 0  . lipase/protease/amylase (CREON) 12000 units CPEP capsule Take 1 capsule (12,000 Units total) by mouth 3 (three) times daily with meals. (Patient not taking: Reported on 06/25/2016) 270 capsule 0  . magnesium oxide (MAG-OX) 400 (241.3 Mg) MG tablet Take 1 tablet (400 mg total) by mouth daily. 30 tablet 0  . metoprolol tartrate (LOPRESSOR) 25 MG tablet Take 1 tablet (25 mg total) by  mouth 3 (three) times daily. 90 tablet 0  . Multiple Vitamin (MULTIVITAMIN WITH MINERALS) TABS tablet Take 1 tablet by mouth daily. 30 tablet 0  . potassium chloride (K-DUR) 10 MEQ tablet Take 2 tablets (20 mEq total) by mouth daily. 60 tablet 0  . saccharomyces boulardii (FLORASTOR) 250 MG capsule Take 1 capsule (250 mg total) by mouth 2 (two) times daily. (Patient not taking: Reported on 06/25/2016) 60 capsule 0  . spironolactone (ALDACTONE) 25 MG tablet Take 0.5 tablets (12.5 mg total) by mouth daily. 30 tablet 0  . sucralfate (CARAFATE) 1 g tablet Take 1 tablet (1 g total) by mouth 2 (two) times daily. 60 tablet 0  . thiamine 100 MG tablet Take 1 tablet (100 mg total) by mouth daily. (Patient not taking: Reported on 05/27/2016) 30 tablet 0  . zolpidem (AMBIEN) 5 MG tablet Take 1 tablet (5 mg total) by mouth at bedtime  as needed for sleep. 30 tablet 0    Home: Home Living Family/patient expects to be discharged to:: Private residence Living Arrangements: Spouse/significant other Available Help at Discharge: Family, Available 24 hours/day Type of Home: House Home Access: Stairs to enter [employer] of Steps: 4 Entrance Stairs-Rails: Right, Left, Can reach both Home Layout: One level Bathroom Shower/Tub: Multimedia programmer: Handicapped height Bathroom Accessibility: Yes Home Equipment: Cane - single point, Grab bars - tub/shower, Hand held shower head, Walker - 2 wheels Additional Comments: States his son coming from [place] to help out as well  Lives With: Spouse   Functional History: Prior Function Level of Independence: Independent with assistive device(s) Comments: Used SPC for mobility but recently has been using RW   Functional Status:  Mobility: Bed Mobility Overal bed mobility: Needs Assistance Bed Mobility: Rolling, Sidelying to Sit Rolling: Mod assist Sidelying to sit: Max assist Sit to sidelying: Min assist General bed mobility comments: assist to mobilize bilat LE and rotate and elevate trunk into sitting; HOB elevated and use of rail Transfers Overall transfer level: Needs assistance Equipment used: Rolling walker (2 wheeled) Transfers: Sit to/from Stand, [employer] Transfers Sit to Stand: Mod assist, From elevated surface Stand pivot transfers: Mod assist, +2 physical assistance General transfer comment: from EOB and BSC; pt fatigued after sitting on BSC and had increased difficulty with advancing L LE and weight shifting; assist to power up into standing and maintain balance in standing; assist to weight shift, mainatain L grip on RW, pivot L foot, and manage RW with pivot Ambulation/Gait Ambulation/Gait assistance: Mod assist, +2 safety/equipment Ambulation Distance (Feet): 4 Feet Assistive device: Rolling walker (2 wheeled) Gait  Pattern/deviations: Step-to pattern, Decreased step length - left, Decreased weight shift to right, Trunk flexed, Wide base of support General Gait Details: multimodal cues for posture, weight shifting, and advancing L LE; limited by pt requesting BSC and fatigue Gait velocity: decreased Gait velocity interpretation: Below normal speed for age/gender    ADL: ADL Overall ADL's : Needs assistance/impaired Grooming: Wash/dry hands, Wash/dry face, Sitting, Minimal assistance Toilet Transfer: +2 for physical assistance, Moderate assistance Functional mobility during ADLs: +2 for physical assistance, Minimal assistance General ADL Comments: pt requires cues for sequence and anterior weight shift. ( see tranfer section) pt states his wife Kendrick Fries is his POA  Cognition: Cognition Overall Cognitive Status: Impaired/Different from baseline Arousal/Alertness: Awake/alert Orientation Level: Oriented X4 Attention: Alternating Alternating Attention: Appears intact Memory: Appears intact Awareness: Impaired Awareness Impairment: Anticipatory impairment Problem Solving: Impaired Problem Solving Impairment: Functional basic Cognition Arousal/Alertness: Awake/alert Behavior During  Therapy: WFL for tasks assessed/performed Overall Cognitive Status: Impaired/Different from baseline Area of Impairment: Following commands, Problem solving Orientation Level:  (oriented to name, date, place, stroke) Memory: Decreased short-term memory Following Commands: Follows multi-step commands inconsistently, Follows multi-step commands with increased time Safety/Judgement: Decreased awareness of safety, Decreased awareness of deficits Awareness: Emergent Problem Solving: Slow processing, Decreased initiation, Difficulty sequencing, Requires verbal cues, Requires tactile cues General Comments: requires incr (A) for sequence of task and delayed initiation   Blood pressure 125/90, pulse 79, temperature 97.7 F  (36.5 C), temperature source Oral, resp. rate 16, height 6' (1.829 m), weight 103.8 kg (228 lb 14.4 oz), SpO2 100 %. Physical Exam  Nursing note and vitals reviewed. Constitutional: He is oriented to person, place, and time. He appears well-developed and well-nourished.  HENT:  Head: Normocephalic and atraumatic.  Eyes: Conjunctivae are normal. Pupils are equal, round, and reactive to light.  Neck: Normal range of motion. Neck supple.  Cardiovascular: Normal rate.  An irregular rhythm present. Exam reveals no gallop and no friction rub.   No murmur heard. Respiratory: Effort normal. No stridor. No respiratory distress. He has no wheezes. He has rhonchi. He has no rales.  GI: Soft. Bowel sounds are increased. There is no tenderness. There is no rebound and no guarding.  Mild distention  Neurological: He is alert and oriented to person, place, and time.  Left facial weakness with minimal dysarthria and occasional wet voice. Flat affect with delayed processing and slow movements.Fair insight and awareness. He is able to follow simple one and two step motor commands.  Left sided weakness: LUW 2/5 deltoid, bicep, tricep, wrist, hand, 1+ to 2-/5 HF, KE, ADF/PF. Senses pain Left arm and leg.   Skin: Skin is warm and dry. No rash noted.  BLE with dry flaky skin.  Psychiatric: His affect is blunt. His speech is delayed. He is slowed.    Results for orders placed or performed during the hospital encounter of 06/25/16 (from the past 48 hour(s))  CBC     Status: Abnormal   Collection Time: 07/03/16  4:16 AM  Result Value Ref Range   WBC 8.2 4.0 - 10.5 K/uL   RBC 2.65 (L) 4.22 - 5.81 MIL/uL   Hemoglobin 8.5 (L) 13.0 - 17.0 g/dL   HCT 25.6 (L) 39.0 - 52.0 %   MCV 96.6 78.0 - 100.0 fL   MCH 32.1 26.0 - 34.0 pg   MCHC 33.2 30.0 - 36.0 g/dL   RDW 17.1 (H) 11.5 - 15.5 %   Platelets 179 150 - 400 K/uL  Basic metabolic panel     Status: Abnormal   Collection Time: 07/03/16  4:16 AM  Result Value Ref  Range   Sodium 140 135 - 145 mmol/L   Potassium 3.3 (L) 3.5 - 5.1 mmol/L   Chloride 118 (H) 101 - 111 mmol/L   CO2 17 (L) 22 - 32 mmol/L   Glucose, Bld 97 65 - 99 mg/dL   BUN 19 6 - 20 mg/dL   Creatinine, Ser 1.72 (H) 0.61 - 1.24 mg/dL   Calcium 8.3 (L) 8.9 - 10.3 mg/dL   GFR calc non Af Amer 37 (L) >60 mL/min   GFR calc Af Amer 43 (L) >60 mL/min    Comment: (NOTE) The eGFR has been calculated using the CKD EPI equation. This calculation has not been validated in all clinical situations. eGFR's persistently <60 mL/min signify possible Chronic Kidney Disease.    Anion gap 5 5 - 15  Magnesium     Status: Abnormal   Collection Time: 07/03/16  4:16 AM  Result Value Ref Range   Magnesium 1.6 (L) 1.7 - 2.4 mg/dL  Ferritin     Status: Abnormal   Collection Time: 07/03/16 10:51 AM  Result Value Ref Range   Ferritin 499 (H) 24 - 336 ng/mL  Iron and TIBC     Status: Abnormal   Collection Time: 07/03/16 10:51 AM  Result Value Ref Range   Iron 34 (L) 45 - 182 ug/dL   TIBC 207 (L) 250 - 450 ug/dL   Saturation Ratios 16 (L) 17.9 - 39.5 %   UIBC 173 ug/dL  Occult blood card to lab, stool     Status: None   Collection Time: 07/03/16  2:49 PM  Result Value Ref Range   Fecal Occult Bld NEGATIVE NEGATIVE  C difficile quick scan w PCR reflex     Status: None   Collection Time: 07/03/16  2:49 PM  Result Value Ref Range   C Diff antigen NEGATIVE NEGATIVE   C Diff toxin NEGATIVE NEGATIVE   C Diff interpretation No C. difficile detected.   Glucose, capillary     Status: Abnormal   Collection Time: 07/03/16  5:51 PM  Result Value Ref Range   Glucose-Capillary 109 (H) 65 - 99 mg/dL   Comment 1 Notify RN    Comment 2 Document in Chart   Glucose, capillary     Status: None   Collection Time: 07/03/16 10:48 PM  Result Value Ref Range   Glucose-Capillary 97 65 - 99 mg/dL   Comment 1 Notify RN    Comment 2 Document in Chart   CBC     Status: Abnormal   Collection Time: 07/04/16  6:49 AM    Result Value Ref Range   WBC 6.9 4.0 - 10.5 K/uL   RBC 2.95 (L) 4.22 - 5.81 MIL/uL   Hemoglobin 9.5 (L) 13.0 - 17.0 g/dL   HCT 28.7 (L) 39.0 - 52.0 %   MCV 97.3 78.0 - 100.0 fL   MCH 32.2 26.0 - 34.0 pg   MCHC 33.1 30.0 - 36.0 g/dL   RDW 17.4 (H) 11.5 - 15.5 %   Platelets 174 150 - 400 K/uL  Basic metabolic panel     Status: Abnormal   Collection Time: 07/04/16  6:49 AM  Result Value Ref Range   Sodium 142 135 - 145 mmol/L   Potassium 3.1 (L) 3.5 - 5.1 mmol/L   Chloride 119 (H) 101 - 111 mmol/L   CO2 16 (L) 22 - 32 mmol/L   Glucose, Bld 95 65 - 99 mg/dL   BUN 21 (H) 6 - 20 mg/dL   Creatinine, Ser 1.68 (H) 0.61 - 1.24 mg/dL   Calcium 8.3 (L) 8.9 - 10.3 mg/dL   GFR calc non Af Amer 38 (L) >60 mL/min   GFR calc Af Amer 44 (L) >60 mL/min    Comment: (NOTE) The eGFR has been calculated using the CKD EPI equation. This calculation has not been validated in all clinical situations. eGFR's persistently <60 mL/min signify possible Chronic Kidney Disease.    Anion gap 7 5 - 15  Glucose, capillary     Status: None   Collection Time: 07/04/16  6:49 AM  Result Value Ref Range   Glucose-Capillary 91 65 - 99 mg/dL   Comment 1 Notify RN    Comment 2 Document in Chart        Medical Problem List and  Plan: 1.  Left hemiparesis and funtctional deficits secondary to right MCA embolic infarct 2.  DVT Prophylaxis/Anticoagulation: Pharmaceutical: Lovenox 3. Pain Management: tylenol prn. Support limbs as needed to maintain joint position and prevent contracture 4. Mood: team to follow for ego support. SW to assess upon admit.  5. Neuropsych: This patient is not yet capable of making decisions on his own behalf. 6. Skin/Wound Care: encourage nutrition and pressure relief as needed 7. Fluids/Electrolytes/Nutrition: encourage PO. Check electrolytes upon admit 8. Afib: ASA daily  -metoprolol increased to 60m BID for better rate control  -observe patient's tolerance with therapy  -adjust  regimen as needed 9. AKI: responding to IV hydration  -Labs stable today  -re-check BMET in AM 10. Hypokalemia: continue to replete potassium  -recheck level in AM 11. Anemia:  Hgb 14.1 at admission down to --->8.5 yesterday. Back to 9.5 today  -labs indicate anemia of chronic disease  -stool hemoccult negative  -encourage appropriate nutrition  12. Cirrhosis of Liver.  -monitor LFT's and ammonia as needed   -labs recently improved 13. HTN--metoprolol     Post Admission Physician Evaluation: 1. Functional deficits secondary  to Right MCA infarct. 2. Patient is admitted to receive collaborative, interdisciplinary care between the physiatrist, rehab nursing staff, and therapy team. 3. Patient's level of medical complexity and substantial therapy needs in context of that medical necessity cannot be provided at a lesser intensity of care such as a SNF. 4. Patient has experienced substantial functional loss from his/her baseline which was documented above under the "Functional History" and "Functional Status" headings.  Judging by the patient's diagnosis, physical exam, and functional history, the patient has potential for functional progress which will result in measurable gains while on inpatient rehab.  These gains will be of substantial and practical use upon discharge  in facilitating mobility and self-care at the household level. 5. Physiatrist will provide 24 hour management of medical needs as well as oversight of the therapy plan/treatment and provide guidance as appropriate regarding the interaction of the two. 6. 24 hour rehab nursing will assist with bladder management, bowel management, safety, skin/wound care, disease management, medication administration, pain management and patient education  and help integrate therapy concepts, techniques,education, etc. 7. PT will assess and treat for/with: Lower extremity strength, range of motion, stamina, balance, functional mobility, safety,  adaptive techniques and equipment, NMR, visual-spatial awareness, family education.   Goals are: supervision to mod I. 8. OT will assess and treat for/with: ADL's, functional mobility, safety, upper extremity strength, adaptive techniques and equipment, NMR, family education, community reintegration, ego support.   Goals are: supervision to mod I. Therapy may proceed with showering this patient. 9. SLP will assess and treat for/with: cognition, communication, education.  Goals are: mod I to supervision. 10. Case Management and Social Worker will assess and treat for psychological issues and discharge planning. 11. Team conference will be held weekly to assess progress toward goals and to determine barriers to discharge. 12. Patient will receive at least 3 hours of therapy per day at least 5 days per week. 13. ELOS: 15-19 days       14. Prognosis:  excellent     ZMeredith Staggers MD, FP[facility]rokePhysical Medicine & Rehabilitation 07/04/2016  07/04/2016

## 2016-07-04 NOTE — Clinical Social Work Note (Signed)
CSW was notified yesterday that pt will discharge to CIR when stable. Pt is ready for discharge today and will admit to CIR. RN is calling report. CSW is signing off as no further needs identified.   Nathan Frank, MSW, LCSW  Clinical Social Worker  [phone]

## 2016-07-05 ENCOUNTER — Inpatient Hospital Stay (HOSPITAL_COMMUNITY): Payer: Self-pay | Admitting: Occupational Therapy

## 2016-07-05 ENCOUNTER — Inpatient Hospital Stay (HOSPITAL_COMMUNITY): Payer: Medicare Other | Admitting: Physical Therapy

## 2016-07-05 DIAGNOSIS — I5033 Acute on chronic diastolic (congestive) heart failure: Secondary | ICD-10-CM

## 2016-07-05 DIAGNOSIS — I4891 Unspecified atrial fibrillation: Secondary | ICD-10-CM

## 2016-07-05 DIAGNOSIS — K7031 Alcoholic cirrhosis of liver with ascites: Secondary | ICD-10-CM

## 2016-07-05 LAB — CBC WITH DIFFERENTIAL/PLATELET
Basophils Absolute: 0 10*3/uL (ref 0.0–0.1)
Basophils Relative: 0 %
EOS PCT: 2 %
Eosinophils Absolute: 0.1 10*3/uL (ref 0.0–0.7)
HCT: 30.8 % — ABNORMAL LOW (ref 39.0–52.0)
Hemoglobin: 10 g/dL — ABNORMAL LOW (ref 13.0–17.0)
LYMPHS ABS: 0.8 10*3/uL (ref 0.7–4.0)
LYMPHS PCT: 14 %
MCH: 31.6 pg (ref 26.0–34.0)
MCHC: 32.5 g/dL (ref 30.0–36.0)
MCV: 97.5 fL (ref 78.0–100.0)
MONO ABS: 0.6 10*3/uL (ref 0.1–1.0)
MONOS PCT: 10 %
Neutro Abs: 4.2 10*3/uL (ref 1.7–7.7)
Neutrophils Relative %: 74 %
PLATELETS: 230 10*3/uL (ref 150–400)
RBC: 3.16 MIL/uL — ABNORMAL LOW (ref 4.22–5.81)
RDW: 17.4 % — AB (ref 11.5–15.5)
WBC: 5.7 10*3/uL (ref 4.0–10.5)

## 2016-07-05 LAB — COMPREHENSIVE METABOLIC PANEL
ALT: 24 U/L (ref 17–63)
AST: 45 U/L — ABNORMAL HIGH (ref 15–41)
Albumin: 2.1 g/dL — ABNORMAL LOW (ref 3.5–5.0)
Alkaline Phosphatase: 48 U/L (ref 38–126)
Anion gap: 7 (ref 5–15)
BUN: 19 mg/dL (ref 6–20)
CHLORIDE: 117 mmol/L — AB (ref 101–111)
CO2: 14 mmol/L — ABNORMAL LOW (ref 22–32)
CREATININE: 1.67 mg/dL — AB (ref 0.61–1.24)
Calcium: 8.4 mg/dL — ABNORMAL LOW (ref 8.9–10.3)
GFR, EST AFRICAN AMERICAN: 45 mL/min — AB (ref >60)
GFR, EST NON AFRICAN AMERICAN: 38 mL/min — AB (ref >60)
Glucose, Bld: 111 mg/dL — ABNORMAL HIGH (ref 65–99)
Potassium: 3 mmol/L — ABNORMAL LOW (ref 3.5–5.1)
Sodium: 138 mmol/L (ref 135–145)
TOTAL PROTEIN: 6.1 g/dL — AB (ref 6.5–8.1)
Total Bilirubin: 0.7 mg/dL (ref 0.3–1.2)

## 2016-07-05 MED ORDER — POTASSIUM CHLORIDE CRYS ER 20 MEQ PO TBCR
20.0000 meq | EXTENDED_RELEASE_TABLET | Freq: Two times a day (BID) | ORAL | Status: DC
  Filled 2016-07-05: qty 1

## 2016-07-05 MED ORDER — POTASSIUM CHLORIDE 20 MEQ/15ML (10%) PO SOLN
20.0000 meq | Freq: Two times a day (BID) | ORAL | Status: DC
  Administered 2016-07-05 – 2016-07-07 (×4): 20 meq via ORAL
  Filled 2016-07-05 (×4): qty 15

## 2016-07-05 NOTE — Progress Notes (Signed)
Physical Therapy Session Note  Patient Details  Name: Nathan Frank MRN: [id number] Date of Birth: [birth year]  Today's Date: 07/05/2016 PT Individual Time: 1300-1345 PT Individual Time Calculation (min): 45 min    Short Term Goals: Week 1:  PT Short Term Goal 1 (Week 1): Pt will increase bed mobility to mod A.  PT Short Term Goal 2 (Week 1): Pt will increase transfers bed to chair, chair to bed to mod A.  PT Short Term Goal 3 (Week 1): Pt will ambulate about 25 feet with rolling walker and mod A.  PT Short Term Goal 4 (Week 1): Pt ascend/descend 2 stairs with B rails and mod A.  PT Short Term Goal 5 (Week 1): Pt will propel w/c about 50 feet with min A.   Skilled Therapeutic Interventions/Progress Updates:  Pt was seen bedside in the pm, sitting up in w/c. Pt fatigued from am sessions, but willing to participate with therapy. Pt transported to gym. Pt transferred sit to stand with rolling walker and max to total A with constant verbal cues. Pt performed car transfers with max to total A and verbal cues. Pt required increased processing time this pm secondary to fatigue. Also noted increased left lateral lean secondary to fatigue. Pt returned to room. Pt transferred w/c to edge of bed with max and constant verbal cues. Pt transferred edge of bed to supine with max A and verbal cues. Pt left sitting up in bed with call bell within reach and bed alarm on.   Therapy Documentation Precautions:  Precautions Precautions: Fall Restrictions Weight Bearing Restrictions: No General:   Pain: No c/o pain.   See Function Navigator for Current Functional Status.   Therapy/Group: Individual Therapy  Dub Amis 07/05/2016, 4:40 PM

## 2016-07-05 NOTE — Progress Notes (Signed)
[facility] PHYSICAL MEDICINE & REHABILITATION     PROGRESS NOTE    Subjective/Complaints: Had a reasonable night. Able to sleep. Denies any breathing problems. Belly pain. Still feels weak  ROS: Pt denies fever, rash/itching, headache, blurred or double vision, nausea, vomiting, abdominal pain, diarrhea, chest pain, shortness of breath, palpitations, dysuria, dizziness, neck or back pain, bleeding, anxiety, or depression   Objective: Vital Signs: Blood pressure 115/67, pulse 94, temperature 97.7 F (36.5 C), temperature source Oral, resp. rate 16, height 6' (1.829 m), weight 103.8 kg (228 lb 13.4 oz), SpO2 98 %. Dg Chest Port 1 View  Result Date: 07/03/2016 CLINICAL DATA:  Patient with congestive heart failure. EXAM: PORTABLE CHEST 1 VIEW COMPARISON:  Chest radiograph 06/25/2016. FINDINGS: Monitoring leads overlie the patient. Patient is rotated. Cardiac contours upper limits of normal. Tortuosity of the thoracic aorta. Heterogeneous opacities within the lower lungs bilaterally. No pleural effusion or pneumothorax. IMPRESSION: Heterogeneous opacities within the lung bases bilaterally favored to represent atelectasis. Aspiration or infection not excluded. Electronically Signed   By: Lovey Newcomer M.D.   On: 07/03/2016 16:25    Recent Labs  07/03/16 0416 07/04/16 0649  WBC 8.2 6.9  HGB 8.5* 9.5*  HCT 25.6* 28.7*  PLT 179 174    Recent Labs  07/03/16 0416 07/04/16 0649  NA 140 142  K 3.3* 3.1*  CL 118* 119*  GLUCOSE 97 95  BUN 19 21*  CREATININE 1.72* 1.68*  CALCIUM 8.3* 8.3*   CBG (last 3)   Recent Labs  07/03/16 1751 07/03/16 2248 07/04/16 0649  GLUCAP 109* 97 91    Wt Readings from Last 3 Encounters:  07/04/16 103.8 kg (228 lb 13.4 oz)  06/30/16 103.8 kg (228 lb 14.4 oz)  06/16/16 92.6 kg (204 lb 3.2 oz)    Physical Exam:  Constitutional: He is oriented to person, place, and time. He appears well-developed and well-nourished.  HENT:  Head: Normocephalic and  atraumatic.  Eyes: Conjunctivae are normal. Pupils are equal, round, and reactive to light.  Neck: Normal range of motion. Neck supple.  Cardiovascular: Normal rate.  An irregular rhythm present. Exam reveals no gallop and no friction rub.   No murmur heard. Respiratory: Effort normal. No stridor. No respiratory distress. He has no wheezes. He has rhonchi. He has no rales.  GI: Soft. Bowel sounds are increased. There is no tenderness. There is no rebound and no guarding.  Mild distention  Ext: 1+ edema RUE Neurological: He is alert and oriented to person, place, and time.  Left facial weakness with minimal dysarthria and occasional wet voice. Flat affect with delayed processing and slow movements.Fair insight and awareness. He is able to follow simple one and two step motor commands.  Left sided weakness: LUW 2/5 deltoid, bicep, tricep, wrist, hand, 1+ to 2-/5 HF, KE, ADF/PF. Senses pain Left arm and leg.   Skin: Skin is warm and dry. No rash noted.  BLE with dry flaky skin.  Psychiatric: His affect is blunt. His speech is delayed. He is slowed.   Assessment/Plan: 1. 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 Physiatrist is providing close team supervision and 24 hour management of active medical problems listed below. Physiatrist and rehab team continue to assess barriers to discharge/monitor patient progress toward functional and medical goals.  Function:  Bathing Bathing position      Bathing parts  Bathing assist        Upper Body Dressing/Undressing Upper body dressing                    Upper body assist        Lower Body Dressing/Undressing Lower body dressing                                  Lower body assist        Toileting Toileting          Toileting assist     Transfers Chair/bed transfer              Locomotion Ambulation           Wheelchair          Cognition Comprehension Comprehension assist level: Understands basic 75 - 89% of the time/ requires cueing 10 - 24% of the time  Expression Expression assist level: Expresses basic 75 - 89% of the time/requires cueing 10 - 24% of the time. Needs helper to occlude trach/needs to repeat words.  Social Interaction Social Interaction assist level: Interacts appropriately 75 - 89% of the time - Needs redirection for appropriate language or to initiate interaction.  Problem Solving Problem solving assist level: Solves basic 75 - 89% of the time/requires cueing 10 - 24% of the time  Memory Memory assist level: Recognizes or recalls 75 - 89% of the time/requires cueing 10 - 24% of the time   Medical Problem List and Plan: 1.  Left hemiparesis and funtctional deficits secondary to right MCA embolic infarct  -begin therapies today 2.  DVT Prophylaxis/Anticoagulation:   Lovenox 3. Pain Management: tylenol prn. Support limbs as needed to maintain joint position and prevent contracture 4. Mood: team to follow for ego support. SW to assess upon admit.  5. Neuropsych: This patient is not yet capable of making decisions on his own behalf. 6. Skin/Wound Care: encourage nutrition and pressure relief as needed 7. Fluids/Electrolytes/Nutrition: encourage PO. Check electrolytes upon admit 8. Afib: ASA daily             -metoprolol increased to 50mg  BID for better rate control             -observe patient's tolerance with therapy             -adjust regimen as needed 9. AKI: responding to IV hydration             -continue to push pos  -await labs today.  10. Hypokalemia: continue to replete potassium             -labs pending 11. Anemia:  Hgb 14.1 at admission down to --->8.5. Back to 9.5 on 9/30             -labs indicate anemia of chronic disease             -stool hemoccult negative             -encourage appropriate nutrition   12. Cirrhosis of Liver.             -monitor LFT's and ammonia as needed                    -labs recently improved 13. HTN--metoprolol LOS (Days) 1 A FACE TO FACE EVALUATION WAS PERFORMED  Yalanda Soderman T 07/05/2016 8:53 AM

## 2016-07-05 NOTE — Evaluation (Signed)
Occupational Therapy Assessment and Plan  Patient Details  Name: Nathan Frank MRN: [id number] Date of Birth: [birth year]  OT Diagnosis: abnormal posture, altered mental status, cognitive deficits, flaccid hemiplegia and hemiparesis and muscle weakness (generalized) Rehab Potential: Rehab Potential (ACUTE ONLY): Good ELOS: 25-27 days   Today's Date: 07/05/2016 OT Individual Time: 1102-1206 OT Individual Time Calculation (min): 64 min  Missed Time: 45 minutes     Problem List:  Patient Active Problem List   Diagnosis Date Noted  . Left hemiparesis ([place]) 07/04/2016  . Acute ischemic right MCA stroke ([place]) 07/04/2016  . Obesity 07/01/2016  . Family hx-stroke 07/01/2016  . Hyperglycemia 07/01/2016  . Wound of left leg 07/01/2016  . Dysphagia, post-stroke   . Dysarthria, post-stroke   . Pyrexia   . Bleeding gastrointestinal   . Polysubstance abuse   . Acute on chronic combined systolic and diastolic heart failure ([place])   . Mitral valve regurgitation   . Tachypnea   . Anemia of chronic disease   . Leukocytosis   . 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 Hypomagnesemia 06/09/2016  . Acute on chronic diastolic CHF (congestive heart failure) ([place]) 06/09/2016  . Alcoholic cirrhosis of liver with ascites ([place])   . Other specified hypothyroidism   . Hepatocellular carcinoma ([place])   . Alcohol withdrawal ([place]) 05/27/2016  . Generalized weakness 05/27/2016  . Hypokalemia 05/27/2016  . Dehydration 05/27/2016  . Diarrhea 04/28/2016  . SIRS (systemic inflammatory response syndrome) ([place]) 04/19/2016  . Alcohol use disorder, moderate, in controlled environment, dependence ([place])   . Lactic acidosis   . Tachycardia   . Atrial fibrillation with RVR ([place]) 04/18/2016  . Chest pain 04/18/2016  . Hypotension 04/18/2016  . Elevated lactic acid level 04/18/2016  . Chronic atrial fibrillation ([place]) 04/18/2016  . SVT  (supraventricular tachycardia) ([place])   . Prolonged Q-T interval on ECG   . Neuropathy ([place])   . Essential hypertension   . Thrombocytopenia ([place]) 04/16/2015  . Abnormal TSH 04/16/2015  . NSTEMI (non-ST elevated myocardial infarction) ([place])   . Acute gastric ulcer   . Acute gastritis with hemorrhage   . GI bleed due to NSAIDs 12/13/2014  . Right knee pain 12/13/2014  . Dizziness and giddiness 12/13/2014  . Upper GI bleeding 12/13/2014  . Heroin overdose 02/20/2014  . S/P alcohol detoxification 02/02/2014  . Alcohol dependence ([place]) 02/02/2014  . Depressive disorder 02/01/2014  . Symptomatic cholelithiasis 12/15/2013  . Abdominal pain 12/14/2013  . Gastritis 06/07/2012  . DVT of lower limb, acute ([place]) 06/06/2012  . Oral thrush 06/05/2012  . Alcohol abuse 06/04/2012  . Weight loss 06/04/2012  . 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 Liver cirrhosis ([place]) 06/04/2012  . Hematuria 06/04/2012    Past Medical History:  Past Medical History:  Diagnosis Date  . Abnormal TSH   . Acute gastric ulcer   . Acute gastritis with hemorrhage   . Alcohol abuse   . Alcohol dependence ([place]) 02/02/2014  . Anemia   . CHF (congestive heart failure) ([place])   . Cirrhosis ([place]) 06/04/2012  . Depressive disorder 02/01/2014  . Dizziness and giddiness 12/13/2014  . DVT of lower limb, acute ([place]) 06/06/2012  . Essential hypertension   . Fatty liver   . Gallstones   . Gastritis 06/07/2012  . GERD (gastroesophageal reflux disease)   . GI bleed due to NSAIDs 12/13/2014  . Granulomatous gastritis   .  Hematuria 06/04/2012  . Hepatitis C   . Hepatocellular carcinoma ([place])   . Heroin abuse    "I haven't done that since I don't know when."  . Heroin overdose 02/20/2014  . LV dysfunction    a. 04/2015: EF 45-50% by cath.  . Neuropathy ([place])   . NSTEMI (non-ST elevated myocardial infarction) ([place])    a. 04/2015 - patent coronaries. Etiology possibly due to coronary spasm versus  embolus, stress cardiomyopathy (atypical), and aborted infarction related to plaque rupture with thrombosis and dissolution. Amlodipine started. Not on antiplatelets due to GIB/cirrhosis history.  . Oral thrush 06/05/2012  . Polysubstance abuse    Rare marijuana.  No EtOH x 2 months.   . Prolonged Q-T interval on ECG    a. 12/2014 - treated with magnesium.  . Right knee pain 12/13/2014  . S/P alcohol detoxification 02/02/2014  . SVT (supraventricular tachycardia) ([place])    a. 12/2014 in setting of GIB, ETOH, NSAIDS, gastritis.  . Symptomatic cholelithiasis 12/15/2013  . Thrombocytopenia ([place])   . Upper GI bleeding 12/13/2014  . Weight loss 06/04/2012   Past Surgical History:  Past Surgical History:  Procedure Laterality Date  . CARDIAC CATHETERIZATION N/A 04/15/2015   Procedure: Left Heart Cath and Coronary Angiography;  Surgeon: Belva Crome, MD;  Location: [facility];  Service: Cardiovascular;  Laterality: N/A;  . CIRCUMCISION    . ESOPHAGOGASTRODUODENOSCOPY  06/07/2012   Procedure: ESOPHAGOGASTRODUODENOSCOPY (EGD);  Surgeon: Milus Banister, MD;  Location: [facility];  Service: Endoscopy;  Laterality: N/A;  may need treatment of varices  . ESOPHAGOGASTRODUODENOSCOPY N/A 12/14/2014   Procedure: ESOPHAGOGASTRODUODENOSCOPY (EGD);  Surgeon: Jerene Bears, MD;  [facility];  Service: Endoscopy;  Laterality: N/A;  . IR GENERIC HISTORICAL  06/25/2016   IR US GUIDE VASC ACCESS LEFT 06/25/2016 Corrie Mckusick, DO [facility]  . IR GENERIC HISTORICAL  06/25/2016   IR RADIOLOGY PERIPHERAL GUIDED IV START 06/25/2016 Corrie Mckusick, DO [facility]  . IR GENERIC HISTORICAL  06/25/2016   IR PERCUTANEOUS ART THROMBECTOMY/INFUSION INTRACRANIAL INC DIAG ANGIO 06/25/2016 Luanne Bras, MD [facility]  . RADIOLOGY WITH ANESTHESIA N/A 06/25/2016   Procedure: RADIOLOGY WITH ANESTHESIA;  Surgeon: Luanne Bras, MD;  Location: [facility];  Service: Radiology;  Laterality: N/A;  . TONSILLECTOMY       Assessment & Plan Clinical Impression: Nathan Frank a 75 y.o.RH-male with history of Afib, alcoholic cirrhosis with ascites/thrombocytopenia, GIB, polysubstance abuse, Hep C, hepatocellular CA, A fib (no anticoagulation due to non-compliance),  most recent admission for acute on chronic CHF. He was admitted on 06/25/16 after being found on the floor with left sided weakness, right gaze preference and facial droop with dysarthria. 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 He underwent cerebral angio with thrombectomy of R-MCA clot and IA Integrilin. 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 2D echo with EF 40-45% and mild MVR.  Patient currently requires max with basic self-care skills secondary to muscle weakness, decreased cardiorespiratoy endurance, unbalanced muscle activation and decreased coordination, decreased initiation, decreased attention, decreased safety awareness and delayed processing and decreased sitting balance, decreased standing balance, decreased postural control, hemiplegia and decreased balance strategies.  Prior to hospitalization, patient could complete BADLs with modified independent .  Patient will benefit from skilled intervention to increase independence with basic self-care skills prior to discharge home with care partner.  Anticipate patient will require 24 hour supervision and follow up home  health and follow up outpatient.  Skilled Therapeutic Intervention: Pt participated in skilled OT session focusing on initial evaluation, education on OT role and POC, L NMR, left visual field scanning, sequencing, and balance during self care completion. Pt was agreeable to complete bathing at sink due to IV. Bathing completed with overall Max A and max vcs for sequencing. Pt presents with very slow movements and delayed processing. Dressing completed with Max A for LB dressing as well as  for sit<stand. Afterwards pt was agreeable to complete simulated toilet transfer. Pt completed with 2 helpers stand pivot and 1 standby helper for safety. At end of session pt was left in w/c with setup for lunch and in care of nursing. No c/o pain.   2nd Session 1:1 Tx (Missed 45 minutes) Pt was seen for afternoon session and was sleeping in bed. Pt reported feeling too fatigued to participate in tx. "Come back tomorrow." Pt was checked on later in the day and continued to refuse due to fatigue. Will make up time later this week.   OT - End of Session Endurance Deficit: Yes OT Assessment Rehab Potential (ACUTE ONLY): Good Barriers to Discharge:  (N/A) OT Patient demonstrates impairments in the following area(s): Balance;Cognition;Vision;Endurance;Motor;Perception;Safety OT Basic ADL's Functional Problem(s): Bathing;Dressing;Toileting;Grooming OT Advanced ADL's Functional Problem(s): Simple Meal Preparation OT Transfers Functional Problem(s): Toilet;Tub/Shower OT Additional Impairment(s): Fuctional Use of Upper Extremity OT Plan OT Intensity: Minimum of 1-2 x/day, 45 to 90 minutes OT Frequency: 5 out of 7 days OT Duration/Estimated Length of Stay: 25-27 days OT Treatment/Interventions: Discharge planning;Self Care/advanced ADL retraining;Therapeutic Activities;Pain management;UE/LE Coordination activities;Functional mobility training;Cognitive remediation/compensation;Patient/family education;Therapeutic Exercise;DME/adaptive equipment instruction;Neuromuscular re-education;Psychosocial support;UE/LE Strength taining/ROM;Wheelchair propulsion/positioning OT Self Feeding Anticipated Outcome(s): N/A OT Basic Self-Care Anticipated Outcome(s): Supervision  OT Toileting Anticipated Outcome(s): Supervision  OT Bathroom Transfers Anticipated Outcome(s): Supervision  OT Recommendation Recommendations for Other Services:  (N/A) Patient destination: Home Follow Up Recommendations: Home health  OT;Outpatient OT Equipment Recommended: To be determined   OT Evaluation Precautions/Restrictions  Precautions Precautions: Fall Restrictions Weight Bearing Restrictions: No General Chart Reviewed: Yes Family/Caregiver Present: No Vital Signs Therapy Vitals Temp: 97.8 F (36.6 C) Temp Source: Oral Pulse Rate: 79 Resp: 16 BP: (!) 121/91 Patient Position (if appropriate): Sitting Oxygen Therapy SpO2: 100 % O2 Device: Not Delivered Pain   Home Living/Prior Functioning Home Living Family/patient expects to be discharged to:: Private residence Living Arrangements: Spouse/significant other Available Help at Discharge: Family, Available 24 hours/day Type of Home: House Home Access: Stairs to enter [employer] of Steps:  (2) Entrance Stairs-Rails: Right, Left, Can reach both Home Layout: One level Bathroom Shower/Tub: Multimedia programmer: Handicapped height Bathroom Accessibility: Yes  Lives With: Spouse IADL History Homemaking Responsibilities: No Current License: No Mode of Transportation: Family Occupation: Retired Type of Occupation: Education officer, museum  Leisure and Hobbies:  (Listening to [employer] and reading) Prior Function Level of Independence: Independent with basic ADLs, Independent with gait  Able to Take Stairs?: Yes Driving: No Vocation: Retired ADL ADL ADL Comments: Please see functional navigator for ADL levels Vision/Perception  Vision- History Baseline Vision/History: Wears glasses Wears Glasses: Reading only Patient Visual Report: No change from baseline Vision- Assessment Vision Assessment?: Yes Eye Alignment: Within Functional Limits Tracking/Visual Pursuits: Requires cues, head turns, or add eye shifts to track Saccades: Additional eye shifts occurred during testing;Decreased speed of saccadic movement;Additional head turns occurred during testing Convergence: Impaired - to be further tested in functional context Visual  Fields: Left visual field deficit  Cognition  Overall Cognitive Status: Impaired/Different from baseline Arousal/Alertness: Awake/alert Orientation Level: Person;Place;Situation Person: Oriented Place: Oriented Situation: Oriented Year: 2017 Month: October Day of Week: Correct Memory: Appears intact Immediate Memory Recall: Sock;Blue;Bed Memory Recall: Sock;Blue;Bed Memory Recall Sock: Without Cue Memory Recall Blue: Without Cue Memory Recall Bed: Without Cue Attention: Sustained Sustained Attention: Impaired Sustained Attention Impairment: Functional basic Awareness: Impaired Awareness Impairment: Anticipatory impairment Problem Solving: Impaired Problem Solving Impairment: Functional basic Safety/Judgment: Impaired Sensation Sensation Light Touch: Impaired by gross assessment Stereognosis: Not tested Hot/Cold: Not tested Proprioception: Impaired by gross assessment Coordination Gross Motor Movements are Fluid and Coordinated: Yes Fine Motor Movements are Fluid and Coordinated: No Finger Nose Finger Test: L UE overshoots Motor  Motor Motor:  (L hemiparesis) Motor - Skilled Clinical Observations: left sided weakness and left coordination deficits observed at time of evaluation Mobility  Bed Mobility Bed Mobility: Rolling Left;Sit to Supine;Supine to Sit Rolling Left: 3: Mod assist Supine to Sit: 2: Max assist Sit to Supine: 2: Max assist Transfers Sit to Stand: 2: Max assist Stand to Sit: 2: Max assist  Trunk/Postural Assessment  Cervical Assessment Cervical Assessment: Within Functional Limits Thoracic Assessment Thoracic Assessment: Exceptions to [facility] Lumbar Assessment Lumbar Assessment: Exceptions to [facility] Postural Control Postural Control: Deficits on evaluation  Balance Balance Balance Assessed: Yes Static Sitting Balance Static Sitting - Balance Support: Right upper extremity supported;Feet supported Static Sitting - Level of Assistance: 5: Stand by  assistance Static Sitting - Comment/# of Minutes: tends to lean to L requiring verbal cues to correct Static Standing Balance Static Standing - Balance Support: During functional activity Static Standing - Level of Assistance: 2: Max assist Dynamic Standing Balance Dynamic Standing - Balance Support: During functional activity Dynamic Standing - Level of Assistance: 2: Max assist Extremity/Trunk Assessment RUE Assessment RUE Assessment: Exceptions to WFL (3-/5) LUE Assessment LUE Assessment:  (3-/5)   See Function Navigator for Current Functional Status.   Refer to Care Plan for Long Term Goals  Recommendations for other services: None  Discharge Criteria: Patient will be discharged from OT if patient refuses treatment 3 consecutive times without medical reason, if treatment goals not met, if there is a change in medical status, if patient makes no progress towards goals or if patient is discharged from hospital.  The above assessment, treatment plan, treatment alternatives and goals were discussed and mutually agreed upon: by patient  Skeet Simmer 07/05/2016, 4:30 PM

## 2016-07-05 NOTE — Evaluation (Signed)
Physical Therapy Assessment and Plan  Patient Details  Name: Nathan Frank MRN: [id number] Date of Birth: [birth year]  PT Diagnosis: Coordination disorder, Difficulty walking, Hemiparesis non-dominant, Impaired cognition, Impaired sensation and Muscle weakness Rehab Potential: Fair ELOS: 24 to 26 days   Today's Date: 07/05/2016 PT Individual Time: 0900-1000 PT Individual Time Calculation (min): 60 min     Problem List: Patient Active Problem List   Diagnosis Date Noted  . Left hemiparesis ([place]) 07/04/2016  . Acute ischemic right MCA stroke ([place]) 07/04/2016  . Obesity 07/01/2016  . Family hx-stroke 07/01/2016  . Hyperglycemia 07/01/2016  . Wound of left leg 07/01/2016  . Dysphagia, post-stroke   . Dysarthria, post-stroke   . Pyrexia   . Bleeding gastrointestinal   . Polysubstance abuse   . Acute on chronic combined systolic and diastolic heart failure ([place])   . Mitral valve regurgitation   . Tachypnea   . Anemia of chronic disease   . Leukocytosis   . Cerebral infarction due to embolism of right middle cerebral artery ([place]) - s/p mechanical thrombectomy 06/25/2016  . Hypomagnesemia 06/09/2016  . Acute on chronic diastolic CHF (congestive heart failure) ([place]) 06/09/2016  . Alcoholic cirrhosis of liver with ascites ([place])   . Other specified hypothyroidism   . Hepatocellular carcinoma ([place])   . Alcohol withdrawal ([place]) 05/27/2016  . Generalized weakness 05/27/2016  . Hypokalemia 05/27/2016  . Dehydration 05/27/2016  . Diarrhea 04/28/2016  . SIRS (systemic inflammatory response syndrome) ([place]) 04/19/2016  . Alcohol use disorder, moderate, in controlled environment, dependence ([place] )   . Lactic acidosis   . Tachycardia   . Atrial fibrillation with RVR ([place]) 04/18/2016  . Chest pain 04/18/2016  . Hypotension 04/18/2016  . Elevated lactic acid level 04/18/2016  . Chronic atrial fibrillation ([place]) 04/18/2016  . SVT (supraventricular tachycardia) ([place])   . Prolonged Q-T  interval on ECG   . Neuropathy ([place])   . Essential hypertension   . Thrombocytopenia ([place]) 04/16/2015  . Abnormal TSH 04/16/2015  . NSTEMI (non-ST elevated myocardial infarction) ([place])   . Acute gastric ulcer   . Acute gastritis with hemorrhage   . GI bleed due to NSAIDs 12/13/2014  . Right knee pain 12/13/2014  . Dizziness and giddiness 12/13/2014  . Upper GI bleeding 12/13/2014  . Heroin overdose 02/20/2014  . S/P alcohol detoxification 02/02/2014  . Alcohol dependence ([place]) 02/02/2014  . Depressive disorder 02/01/2014  . Symptomatic cholelithiasis 12/15/2013  . Abdominal pain 12/14/2013  . Gastritis 06/07/2012  . DVT of lower limb, acute ([place]) 06/06/2012  . Oral thrush 06/05/2012  . Alcohol abuse 06/04/2012  . Weight loss 06/04/2012  . 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 Liver cirrhosis ([place]) 06/04/2012  . Hematuria 06/04/2012    Past Medical History:  Past Medical History:  Diagnosis Date  . Abnormal TSH   . Acute gastric ulcer   . Acute gastritis with hemorrhage   . Alcohol abuse   . Alcohol dependence ([place]) 02/02/2014  . Anemia   . CHF (congestive heart failure) ([place])   . Cirrhosis ([place]) 06/04/2012  . Depressive disorder 02/01/2014  . Dizziness and giddiness 12/13/2014  . DVT of lower limb, acute ([place]) 06/06/2012  . Essential hypertension   . Fatty liver   . Gallstones   . Gastritis 06/07/2012  . GERD (gastroesophageal reflux disease)   . GI bleed due to NSAIDs 12/13/2014  . Granulomatous gastritis   . Hematuria 06/04/2012  . Hepatitis C   .  Hepatocellular carcinoma ([place])   . Heroin abuse    "I haven't done that since I don't know when."  . Heroin overdose 02/20/2014  . LV dysfunction    a. 04/2015: EF 45-50% by cath.  . Neuropathy ([place])   . NSTEMI (non-ST elevated myocardial infarction) ([place])    a. 04/2015 - patent coronaries. Etiology possibly due to coronary spasm versus embolus, stress cardiomyopathy (atypical), and aborted  infarction related to plaque rupture with thrombosis and dissolution. Amlodipine started. Not on antiplatelets due to GIB/cirrhosis history.  . Oral thrush 06/05/2012  . Polysubstance abuse    Rare marijuana.  No EtOH x 2 months.   . Prolonged Q-T interval on ECG    a. 12/2014 - treated with magnesium.  . Right knee pain 12/13/2014  . S/P alcohol detoxification 02/02/2014  . SVT (supraventricular tachycardia) ([place])    a. 12/2014 in setting of GIB, ETOH, NSAIDS, gastritis.  . Symptomatic cholelithiasis 12/15/2013  . Thrombocytopenia ([place])   . Upper GI bleeding 12/13/2014  . Weight loss 06/04/2012   Past Surgical History:  Past Surgical History:  Procedure Laterality Date  . CARDIAC CATHETERIZATION N/A 04/15/2015   Procedure: Left Heart Cath and Coronary Angiography;  Surgeon: Belva Crome, MD;  Location: [facility];  Service: Cardiovascular;  Laterality: N/A;  . CIRCUMCISION    . ESOPHAGOGASTRODUODENOSCOPY  06/07/2012   Procedure: ESOPHAGOGASTRODUODENOSCOPY (EGD);  Surgeon: Milus Banister, MD;  Location: [facility];  Service: Endoscopy;  Laterality: N/A;  may need treatment of varices  . ESOPHAGOGASTRODUODENOSCOPY N/A 12/14/2014   Procedure: ESOPHAGOGASTRODUODENOSCOPY (EGD);  Surgeon: Jerene Bears, MD;  [facility];  Service: Endoscopy;  Laterality: N/A;  . IR GENERIC HISTORICAL  06/25/2016   IR US GUIDE VASC ACCESS LEFT 06/25/2016 Corrie Mckusick, DO [facility]  . IR GENERIC HISTORICAL  06/25/2016   IR RADIOLOGY PERIPHERAL GUIDED IV START 06/25/2016 Corrie Mckusick, DO [facility]  . IR GENERIC HISTORICAL  06/25/2016   IR PERCUTANEOUS ART THROMBECTOMY/INFUSION INTRACRANIAL INC DIAG ANGIO 06/25/2016 Luanne Bras, MD [facility]  . RADIOLOGY WITH ANESTHESIA N/A 06/25/2016   Procedure: RADIOLOGY WITH ANESTHESIA;  Surgeon: Luanne Bras, MD;  Location: [facility];  Service: Radiology;  Laterality: N/A;  . TONSILLECTOMY      Assessment & Plan Clinical Impression: Patientis a  75 y.o.RH-male with history of Afib, alcoholic cirrhosis with ascites/thrombocytopenia, GIB, polysubstance abuse, Hep C, hepatocellular CA, A fib (no anticoagulation due to non-compliance),  most recent admission for acute on chronic CHF. He was admitted on 06/25/16 after being found on the floor with left sided weakness, right gaze preference and facial droop with dysarthria. 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 He underwent cerebral angio with thrombectomy of R-MCA clot and IA Integrilin. 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 2D echo with EF 40-45% and mild MVR. He tolerated extubation on 09/23 but noted to have weak cough with difficulty handling secretions. He developed fever and was started on IV Vanc/Zosyn.  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 Infarct felt to be embolic due to unknown source and patient placed on ASA for secondary stroke prevention. Swallow evaluation showed significant dysphagia which has improved and he was started on dysphagia 3, thin liquids on 09/28.  He did have an episode of Afib  with RVR yesterday that resolved with addition of BB. Dr. Erlinda Hong recommends anticoagulation and patient to follow up with cardiology after discharge for input. Patient with resultant left sided weakness, dysarthria and cognitive deficits affecting mobility and self care.   Patient transferred to CIR on 07/04/2016 .   Patient currently requires max with mobility secondary to muscle weakness, decreased cardiorespiratoy endurance, decreased coordination, decreased attention to left and decreased problem solving and decreased safety awareness.  Prior to hospitalization, patient was modified independent  with mobility and  lived with Spouse in a House home.  Home access is  (2)Stairs to enter.  Patient will benefit from skilled PT intervention to maximize safe functional mobility, minimize fall risk and decrease caregiver burden for planned discharge home with 24 hour supervision.  Anticipate patient will benefit from follow up [place] at discharge.  PT - End of Session Activity Tolerance: Tolerates 30+ min activity with multiple rests Endurance Deficit: Yes PT Assessment Rehab Potential (ACUTE/IP ONLY): Fair Barriers to Discharge: Inaccessible home environment;Decreased caregiver support PT Patient demonstrates impairments in the following area(s): Balance;Endurance;Motor;Safety;Perception PT Transfers Functional Problem(s): Bed Mobility;Bed to Chair;Car PT Locomotion Functional Problem(s): Ambulation;Wheelchair Mobility;Stairs PT Plan PT Intensity: Minimum of 1-2 x/day ,45 to 90 minutes PT Frequency: 5 out of 7 days PT Duration Estimated Length of Stay: 24 to 26 days PT Treatment/Interventions: Ambulation/gait training;Balance/vestibular training;Discharge planning;DME/adaptive equipment instruction;Functional electrical stimulation;Functional mobility training;Neuromuscular re-education;Patient/family education;Psychosocial support;Stair training;Splinting/orthotics;Therapeutic Activities;Therapeutic Exercise;UE/LE Strength taining/ROM;UE/LE Coordination activities;Visual/perceptual remediation/compensation;Wheelchair propulsion/positioning PT Transfers Anticipated Outcome(s): S transfers PT Locomotion Anticipated Outcome(s): mod I w/c mobility, S ambulation, S stairs PT Recommendation Follow Up Recommendations: Home health PT Patient destination: Home Equipment Recommended: To be determined  Skilled Therapeutic Intervention PT evaluation completed and treatment plan initiated. Pt was able to complete sit to stand transfers with max A and increased time and verbal cues for technique. Pt performed toilet  transfers with max A and grab bar. Pt dependent for hygiene, but able to maintain standing balance to allow for hygiene. Following evaluation, pt left sitting up in w/c with quick release belt in place and call bell within reach.   PT Evaluation Precautions/Restrictions Precautions Precautions: Fall Restrictions Weight Bearing Restrictions: No General Pain Pt c/o mild abd pain.  Home Living/Prior Functioning Home Living Available Help at Discharge: Family;Available 24 hours/day Type of Home: House Home Access: Stairs to enter [employer] of Steps:  (2) Entrance Stairs-Rails: Right;Left;Can reach both Home Layout: One level Bathroom Shower/Tub: Multimedia programmer: Handicapped height Bathroom Accessibility: Yes  Lives With: Spouse Prior Function Level of Independence: Independent with basic ADLs;Independent with gait Vision/Perception  As per OT evaluation. Cognition Overall Cognitive Status: Impaired/Different from baseline Arousal/Alertness: Awake/alert Orientation Level: Oriented X4 Attention: Sustained Sustained Attention: Impaired Memory: Appears intact Awareness: Impaired Problem Solving: Impaired Safety/Judgment: Impaired Sensation Sensation Light Touch: Impaired by gross assessment Proprioception: Impaired by gross assessment Motor  Motor Motor:  (L hemiparesis) Mobility Bed Mobility Bed Mobility: Rolling Left;Sit to Supine;Supine to Sit Rolling Left: 3: Mod assist Supine to Sit: 2: Max assist Sit to Supine: 2: Max assist Transfers Transfers: Yes Sit to Stand: 2: Max assist Stand to Sit: 2: Max assist Stand Pivot Transfers: 2: Max assist Locomotion  Ambulation Ambulation: Yes Ambulation/Gait Assistance: 2: Max Wellsite geologist (Feet): 2 Feet Assistive device: None Stairs / Additional Locomotion Stairs: Yes Stairs Assistance: Not tested (comment) Product manager Mobility: Yes Wheelchair Assistance:  2: Max Lexicographer: Both upper extremities Distance: 10  Trunk/Postural Assessment  Cervical  Assessment Cervical Assessment: Within Functional Limits Thoracic Assessment Thoracic Assessment: Exceptions to [facility] Lumbar Assessment Lumbar Assessment: Exceptions to [facility] Postural Control Postural Control: Deficits on evaluation  Balance Balance Balance Assessed: Yes Static Sitting Balance Static Sitting - Balance Support: Right upper extremity supported;Feet supported Static Sitting - Level of Assistance: 5: Stand by assistance Static Sitting - Comment/# of Minutes: tends to lean to L requiring verbal cues to correct Static Standing Balance Static Standing - Balance Support: During functional activity Static Standing - Level of Assistance: 2: Max assist Dynamic Standing Balance Dynamic Standing - Balance Support: During functional activity Dynamic Standing - Level of Assistance: 2: Max assist Extremity Assessment  B UEs as per OT evaluation.   RLE Assessment RLE Assessment: Exceptions to [facility] RLE PROM (degrees) Overall PROM Right Lower Extremity: Deficits RLE Overall PROM Comments: WFLs except DF to neutral RLE Strength RLE Overall Strength: Deficits RLE Overall Strength Comments: grossly 3-/5  LLE Assessment LLE Assessment: Exceptions to WFL LLE PROM (degrees) Overall PROM Left Lower Extremity: Deficits LLE Overall PROM Comments: WFLs except DF to neutral LLE Strength LLE Overall Strength: Deficits LLE Overall Strength Comments: 2/5 to 2+/5   See Function Navigator for Current Functional Status.   Refer to Care Plan for Long Term Goals  Recommendations for other services: None  Discharge Criteria: Patient will be discharged from PT if patient refuses treatment 3 consecutive times without medical reason, if treatment goals not met, if there is a change in medical status, if patient makes no progress towards goals or if patient is discharged from  hospital.  The above assessment, treatment plan, treatment alternatives and goals were discussed and mutually agreed upon: by patient  Dub Amis 07/05/2016, 4:26 PM

## 2016-07-06 ENCOUNTER — Inpatient Hospital Stay (HOSPITAL_COMMUNITY): Payer: Medicare Other | Admitting: Physical Therapy

## 2016-07-06 ENCOUNTER — Inpatient Hospital Stay (HOSPITAL_COMMUNITY): Payer: Self-pay | Admitting: Physical Therapy

## 2016-07-06 ENCOUNTER — Inpatient Hospital Stay (HOSPITAL_COMMUNITY): Payer: Medicare Other | Admitting: Speech Pathology

## 2016-07-06 ENCOUNTER — Inpatient Hospital Stay (HOSPITAL_COMMUNITY): Payer: Medicare Other | Admitting: Occupational Therapy

## 2016-07-06 DIAGNOSIS — I63511 Cerebral infarction due to unspecified occlusion or stenosis of right middle cerebral artery: Secondary | ICD-10-CM

## 2016-07-06 NOTE — Progress Notes (Signed)
Physical Therapy Session Note  Patient Details  Name: Nathan Frank MRN: [id number] Date of Birth: 1941-06-24  Today's Date: 07/06/2016 PT Individual Time: 1100-1200 PT Individual Time Calculation (min): 60 min    Short Term Goals: Week 1:  PT Short Term Goal 1 (Week 1): Pt will increase bed mobility to mod A.  PT Short Term Goal 2 (Week 1): Pt will increase transfers bed to chair, chair to bed to mod A.  PT Short Term Goal 3 (Week 1): Pt will ambulate about 25 feet with rolling walker and mod A.  PT Short Term Goal 4 (Week 1): Pt ascend/descend 2 stairs with B rails and mod A.  PT Short Term Goal 5 (Week 1): Pt will propel w/c about 50 feet with min A.     Therapy Documentation Precautions:  Precautions Precautions: Fall Restrictions Weight Bearing Restrictions: No  Pain: Pain Assessment Pain Assessment: No/denies pain   Patient performed sit to and from stand transfer with steady max assist.   Patient stood for 30 seconds focusing on upright posture, positioning in midline and even weight distribution.   Patient propelled wheelchair with BUE 10 feet max assist.  Patient performed three sit to and from stand transfers in parallel bars with max assist. Max verbal cues for hand placement and positioning. Patient stood for 20, 45, and 30 seconds. Attempted to have patient ambulate however patient unable to take a step at this time.   Stand pivot transfer to and from commode max assist. Patient attempted to use commode for bowel movement however was able to have a bowel movement.   Patient resting in wheelchair at end of session with all needs met and quick release belt engaged. Patient required frequent and prolonged rest breaks to tolerate session. Patient requires max encouragement and prolonged time for initiation.     See Function Navigator for Current Functional Status.   Therapy/Group: Individual Therapy  [name] 07/06/2016, 2:22 PM

## 2016-07-06 NOTE — Progress Notes (Signed)
Occupational Therapy Session Note  Patient Details  Name: Nathan Frank MRN: [id number] Date of Birth: 07-16-1941  Today's Date: 07/06/2016 OT Individual Time: 1050-1100 OT Individual Time Calculation (min): 10 min   Short Term Goals: Week 1:  OT Short Term Goal 1 (Week 1): Pt will complete toilet transfer with Max A and 1 helper with LRAD OT Short Term Goal 2 (Week 1): Pt will complete sit<stand with LB dressing and Mod A OT Short Term Goal 3 (Week 1): Pt will self sequence BADL steps with mod vcs OT Short Term Goal 4 (Week 1): Pt will engage in 10 minute thearpeutic activity with 1 rest break to improve activity tolerance   Skilled Therapeutic Interventions/Progress Updates:   Pt was seen for brief morning session for makeup of missed time. At time of arrival pt required cues for orientation to CIR and time. Supine<sit completed with Max A with cues for anterior weight shift due to posterior LOBs once in sitting. Assistance required for forward scooting and gripper socks were donned by therapist. Pt completed stand pivot transfer from bed to w/c with Max A and 1 additional helper for safety. Frequent cuing for reminding pt that he has catheter with visual affirmation to ease pts concern. Pt was left for PT handoff. At end of tx.   Therapy Documentation Precautions:  Precautions Precautions: Fall Restrictions Weight Bearing Restrictions: No General:   Vital Signs: Therapy Vitals Temp: 97.8 F (36.6 C) Temp Source: Oral Pulse Rate: 84 Resp: 17 BP: 120/88 Patient Position (if appropriate): Sitting Oxygen Therapy SpO2: 100 % O2 Device: Not Delivered Pain: No c/o pain  Pain Assessment Pain Assessment: No/denies pain ADL: ADL ADL Comments: Please see functional navigator for ADL levels Exercises:   Other Treatments:    See Function Navigator for Current Functional Status.   Therapy/Group: Individual Therapy  Nathan Frank A [name] 07/06/2016, 4:19 PM

## 2016-07-06 NOTE — Progress Notes (Signed)
[name], RN Rehab Admission Coordinator Signed Physical Medicine and Rehabilitation  PMR Pre-admission Date of Service: 07/03/2016 2:36 PM  Related encounter: ED to Hosp-Admission (Discharged) from 06/25/2016 in [facility]       _0 Hide copied text PMR Admission Coordinator Pre-Admission Assessment  Patient: Nathan Frank is an 75 y.o., male MRN: [id number] DOB: 08/06/1941 Height: 6' (182.9 cm) Weight: 103.8 kg (228 lb 14.4 oz)                                                                                                                                                  Insurance Information HMO: Yes   PPO:       PCP:       IPA:       80/20:       OTHER:  Group # [id number] PRIMARY:  [employer] Medicare      Policy#: [id number]      Subscriber:  [place] Name: Sharlett Iles      Phone#: [phone]     Fax#: [phone] but has Epic access Pre-Cert#:  [id number]      Employer:  Retired Benefits:  Phone #: [phone]     Name:  Waldon Merl. Date: 12/04/15     Deduct:  $0      Out of Pocket Max: $4900 (met [phone])      Life Max: unlimited CIR: $345 days 1-5      SNF:  $0 days 1-20; $160 days 21-51; $0 days 52-100 Outpatient: Medical necessity     Co-Pay: $40 Home Health: 100%      Co-Pay: none DME: 80%     Co-Pay: 20% Providers: in network  Medicaid Application Date:        Case Manager:   Disability Application Date:        Case Worker:    Emergency Tax adviser Information    Name Relation Home Work Mobile   Witherell,Yvonne Spouse [phone]  [place]. Son   [phone]     Current Medical History  Patient Admitting Diagnosis:  R MCA infarct  History of Present Illness: A 75 y.o.male with history of Afib, alcoholic cirrhosis with ascites/thrombocytopenia, GIB, polysubstance abuse, Hep C, hepatocellular CA, A fib (no anticoagulation due to non-compliance), most recent  admission for acute on chronic CHF. He was admitted on 06/25/16 after being found on the floor with left sided weakness, right gaze preference and facial droop with dysarthria. 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 He underwent cerebral angio with thrombectomy of R-MCA clot and IA Integrilin. 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 2D echo with EF 40-45% and mild MVR. He tolerated extubation on 09/23 but noted to  have weak cough with difficulty handling secretions. He developed fever and was started on IV Vanc/Zosyn.  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 Infarct felt to be embolic due to unknown source and patient placed on ASA for secondary stroke prevention. Swallow evaluation showed significant dysphagia which has improved and he was started on dysphagia 3, thin liquids on 09/28. He did have an episode of Afib with RVR yesterday that resolved with addition of BB. Dr. Erlinda Hong recommends anticoagulation and patient to follow up with cardiology after discharge for input. Patient with resultant left sided weakness, dysarthria and cognitive deficits affecting mobility and self care. CIR recommended.   Total: 4=NIH  Past Medical History      Past Medical History:  Diagnosis Date  . Abnormal TSH   . Acute gastric ulcer   . Acute gastritis with hemorrhage   . Alcohol abuse   . Alcohol dependence ([place]) 02/02/2014  . Anemia   . CHF (congestive heart failure) ([place])   . Cirrhosis ([place]) 06/04/2012  . Depressive disorder 02/01/2014  . Dizziness and giddiness 12/13/2014  . DVT of lower limb, acute ([place]) 06/06/2012  . Essential hypertension   . Fatty liver   . Gallstones   . Gastritis 06/07/2012  . GERD  (gastroesophageal reflux disease)   . GI bleed due to NSAIDs 12/13/2014  . Granulomatous gastritis   . Hematuria 06/04/2012  . Hepatitis C   . Hepatocellular carcinoma ([place])   . Heroin abuse    "I haven't done that since I don't know when."  . Heroin overdose 02/20/2014  . LV dysfunction    a. 04/2015: EF 45-50% by cath.  . Neuropathy ([place])   . NSTEMI (non-ST elevated myocardial infarction) ([place])    a. 04/2015 - patent coronaries. Etiology possibly due to coronary spasm versus embolus, stress cardiomyopathy (atypical), and aborted infarction related to plaque rupture with thrombosis and dissolution. Amlodipine started. Not on antiplatelets due to GIB/cirrhosis history.  . Oral thrush 06/05/2012  . Polysubstance abuse    Rare marijuana.  No EtOH x 2 months.   . Prolonged Q-T interval on ECG    a. 12/2014 - treated with magnesium.  . Right knee pain 12/13/2014  . S/P alcohol detoxification 02/02/2014  . SVT (supraventricular tachycardia) ([place])    a. 12/2014 in setting of GIB, ETOH, NSAIDS, gastritis.  . Symptomatic cholelithiasis 12/15/2013  . Thrombocytopenia ([place])   . Upper GI bleeding 12/13/2014  . Weight loss 06/04/2012    Family History  family history includes Heart disease in his mother; Prostate cancer in his brother; Stroke in his father.  Prior Rehab/Hospitalizations: No previous rehab.  Has the patient had major surgery during 100 days prior to admission? No  Current Medications   Current Facility-Administered Medications:  .  aspirin tablet 325 mg, 325 mg, Per Tube, Daily, Rosalin Hawking, MD, 325 mg at 07/03/16 1029 .  chlorhexidine (PERIDEX) 0.12 % solution 15 mL, 15 mL, Mouth Rinse, BID, Brand Males, MD, 15 mL at 07/03/16 1028 .  enoxaparin (LOVENOX) injection 40 mg, 40 mg, Subcutaneous, Q24H, Rosalin Hawking, MD, 40 mg at 07/02/16 1644 .  levothyroxine (SYNTHROID, LEVOTHROID) tablet 50 mcg, 50 mcg, Per Tube, Q24H, Javier Glazier, MD, 50 mcg at 07/03/16  1204 .  MEDLINE mouth rinse, 15 mL, Mouth Rinse, TID PC & HS, Rosalin Hawking, MD .  metoprolol (LOPRESSOR) tablet 50 mg, 50 mg, Oral, BID, Rosalin Hawking, MD, 50 mg at 07/03/16 1029 .  multivitamin with minerals tablet 1 tablet, 1 tablet, Oral, Daily, Rosalin Hawking, MD, 1 tablet at 07/03/16 1029 .  oxyCODONE (Oxy IR/ROXICODONE) immediate release tablet 5 mg, 5 mg, Oral, Q6H PRN, Rosalin Hawking, MD, 5 mg at 07/02/16 1021 .  sucralfate (CARAFATE) 1 GM/10ML suspension 1 g, 1 g, Oral, Q6H, Anders Simmonds, MD, 1 g at 07/03/16 1204 .  thiamine (VITAMIN B-1) tablet 100 mg, 100 mg, Per Tube, Daily, Javier Glazier, MD, 100 mg at 07/03/16 1029  Patients Current Diet: DIET DYS 3 Room service appropriate? Yes; Fluid consistency: Thin  Precautions / Restrictions Precautions Precautions: Fall Restrictions Weight Bearing Restrictions: No   Has the patient had 2 or more falls or a fall with injury in the past year?Yes  Prior Activity Level Limited Community (1-2x/wk): Went out 2 X a week.  Not driving.  Walked or got a ride.  Home Assistive Devices / Equipment Home Assistive Devices/Equipment: Kasandra Knudsen (specify quad or straight) Home Equipment: Cane - single point, Grab bars - tub/shower, Hand held shower head, Walker - 2 wheels  Prior Device Use: Indicate devices/aids used by the patient prior to current illness, exacerbation or injury? Walker and [employer]  Prior Functional Level Prior Function Level of Independence: Independent with assistive device(s) Comments: Used SPC for mobility but recently has been using RW   Self Care: Did the patient need help bathing, dressing, using the toilet or eating?  Independent  Indoor Mobility: Did the patient need assistance with walking from room to room (with or without device)? Independent  Stairs: Did the patient need assistance with internal or external stairs (with or without device)? Independent  Functional Cognition: Did the patient need help planning  regular tasks such as shopping or remembering to take medications? Independent  Current Functional Level Cognition Arousal/Alertness: Awake/alert Overall Cognitive Status: Impaired/Different from baseline Orientation Level: Oriented X4 Following Commands: Follows multi-step commands inconsistently, Follows multi-step commands with increased time Safety/Judgement: Decreased awareness of safety, Decreased awareness of deficits General Comments: requires incr (A) for sequence of task and delayed initiation Attention: Alternating Alternating Attention: Appears intact Memory: Appears intact Awareness: Impaired Awareness Impairment: Anticipatory impairment Problem Solving: Impaired Problem Solving Impairment: Functional basic    Extremity Assessment (includes Sensation/Coordination) Upper Extremity Assessment: LUE deficits/detail LUE Deficits / Details: edema present   Lower Extremity Assessment: Defer to PT evaluation RLE Deficits / Details: +edema; AROM WFL; strength 5/5 RLE Sensation: decreased light touch, history of peripheral neuropathy LLE Deficits / Details: AROM WfL; hip flexion 3, hip extension 3+, knee extension 4, ankle DF 5 LLE Sensation: decreased light touch, history of peripheral neuropathy   ADLs Overall ADL's : Needs assistance/impaired Grooming: Wash/dry hands, Wash/dry face, Sitting, Minimal assistance Toilet Transfer: +2 for physical assistance, Moderate assistance Functional mobility during ADLs: +2 for physical assistance, Minimal assistance General ADL Comments: pt requires cues for sequence and anterior weight shift. ( see tranfer section) pt states his wife Kendrick Fries is his POA   Mobility Overal bed mobility: Needs Assistance Bed Mobility: Rolling, Sidelying to Sit Rolling: Mod assist Sidelying to sit: Max assist Sit to sidelying: Min assist General bed mobility comments: assist to mobilize bilat LE and rotate and elevate trunk into sitting; HOB elevated and use  of rail   Transfers Overall transfer level: Needs assistance Equipment used: Rolling walker (2 wheeled) Transfers: Sit to/from Stand, [employer] Transfers Sit to Stand: Mod  assist, From elevated surface Stand pivot transfers: Mod assist, +2 physical assistance General transfer comment: from EOB and BSC; pt fatigued after sitting on BSC and had increased difficulty with advancing L LE and weight shifting; assist to power up into standing and maintain balance in standing; assist to weight shift, mainatain L grip on RW, pivot L foot, and manage RW with pivot   Ambulation / Gait / Stairs / Wheelchair Mobility Ambulation/Gait Ambulation/Gait assistance: Mod assist, +2 safety/equipment Ambulation Distance (Feet): 4 Feet Assistive device: Rolling walker (2 wheeled) Gait Pattern/deviations: Step-to pattern, Decreased step length - left, Decreased weight shift to right, Trunk flexed, Wide base of support General Gait Details: multimodal cues for posture, weight shifting, and advancing L LE; limited by pt requesting BSC and fatigue Gait velocity: decreased Gait velocity interpretation: Below normal speed for age/gender   Posture / Balance Balance Overall balance assessment: Needs assistance Sitting-balance support: Bilateral upper extremity supported, Feet supported Sitting balance-Leahy Scale: Fair Standing balance support: Bilateral upper extremity supported, During functional activity Standing balance-Leahy Scale: Poor Standing balance comment: flexed posture with cues to facilite upright positioning required   Special needs/care consideration BiPAP/CPAP No CPM No Continuous Drip IV 0.9% NS 100 mL/hr Dialysis No       Life Vest No Oxygen No Special Bed No Trach Size No Wound Vac (area) No    Skin No                           Bowel mgmt: Having loose stools 07/03/16 Bladder mgmt: urinary catheter in place Diabetic mgmt No Enteric Precautions: Yes   Previous Home Environment Living  Arrangements: Spouse/significant other  Lives With: Spouse Available Help at Discharge: Family, Available 24 hours/day Type of Home: House Home Layout: One level Home Access: Stairs to enter Entrance Stairs-Rails: Right, Left, Can reach both Entrance Stairs-Number of Steps: 4 Bathroom Shower/Tub: Multimedia programmer: Handicapped height Bathroom Accessibility: Yes Home Care Services: No Additional Comments: States his son coming from [place] to help out as well  Discharge Living Setting Plans for Discharge Living Setting: Patient's home, House, Lives with (comment) (Lives with wife.) Type of Home at Discharge: House Discharge Home Layout: One level Discharge Home Access: Stairs to enter Entrance Stairs-Number of Steps: 3 Does the patient have any problems obtaining your medications?: No  Social/Family/Support Systems Patient Roles: Spouse, Parent (Has a wife, a son and 2 daughters.) Has a daughter here in [place], a daughter in [place] and a son in [place]. Contact Information: Harutyun Monteverde - wife Anticipated Caregiver: wife Anticipated Caregiver's Contact Information: Kendrick Fries - wife [phone] Ability/Limitations of Caregiver: Wife does not work and can assist. Careers adviser: 24/7 Discharge Plan Discussed with Primary Caregiver: Yes Is Caregiver In Agreement with Plan?: Yes Does Caregiver/Family have Issues with Lodging/Transportation while Pt is in Rehab?: No  Goals/Additional Needs Patient/Family Goal for Rehab: PT/OT supervision to min assist, SLP mod I and supervision goals Expected length of stay: 20-24 days Cultural Considerations: None Dietary Needs: Dys 3, thin liquids Equipment Needs: TBD Pt/Family Agrees to Admission and willing to participate: Yes Program Orientation Provided & Reviewed with Pt/Caregiver Including Roles  & Responsibilities: Yes  Decrease burden of Care through IP rehab admission: N/A  Possible need for  SNF placement upon discharge: Not planned  Patient Condition: This patient's medical and functional status has changed since the consult dated: 06/29/16 in which the Rehabilitation Physician determined and documented that the patient's condition is appropriate  for intensive rehabilitative care in an inpatient rehabilitation facility. See "History of Present Illness" (above) for medical update. Functional changes are: Currently requiring mod assist to ambulate 4 feet RW.  Patient's medical and functional status update has been discussed with the Rehabilitation physician and patient remains appropriate for inpatient rehabilitation. Will admit to inpatient rehab tomorrow.  Preadmission Screen Completed By:  [name], 07/03/2016 2:54 PM ______________________________________________________________________   Discussed status with Dr. Naaman Plummer on 07/03/16 at 1453 and received telephone approval for admission tomorrow.  Admission Coordinator:  [name], time1454/Date09/29/17       Cosigned by: Meredith Staggers, MD at 07/03/2016 3:38 PM  Revision History

## 2016-07-06 NOTE — Progress Notes (Signed)
Ankit Lorie Phenix, MD Physician Signed Physical Medicine and Rehabilitation  Consult Note Date of Service: 06/29/2016 8:50 AM  Related encounter: ED to Hosp-Admission (Discharged) from 06/25/2016 in [place] All Collapse All   [] Hide copied text [] Hover for attribution information      Physical Medicine and Rehabilitation Consult   Reason for Consult: Left sided weakness with sensory deficits, left facial droop with dysarthria,  Referring Physician: Dr. Erlinda Hong.    HPI: Nathan Frank is a 75 y.o. RH-male with history of Afib, alcoholic cirrhosis with ascites/thrombocytopenia, GIB, polysubstance abuse, Hep C, hepatocellular CA, recent admission for acute on chronic CHF. He was admitted on 06/25/16 after being found on the floor with left sided weakness, right gaze preference and facial droop with dysarthria.  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   He underwent cerebral angio with thrombectomy of R-MCA clot and IA Integrilin. 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 2D echo with EF 40-45% and mild MVR.    He tolerated extubation on 09/23 but noted to have weak cough with difficulty handling secretions. He developed fever and was started on IV Vanc/Zosyn.   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 Infarct felt to be embolic due to unknown source and patient placed on ASA for secondary stroke prevention. Swallow evaluation showed significant dysphagia with little to no ability to initiate swallow--NPO recommended. PT evaluation done yesterday revealing difficulty with processing, need for verbal and tactile cues for sequencing, problems with memory and left  sided weakness affecting mobility. CIR recommended  Review of Systems  HENT: Negative for hearing loss.   Eyes: Negative for blurred vision and double vision.  Respiratory: Positive for cough, sputum production and wheezing. Negative for shortness of breath.   Cardiovascular: Negative for chest pain, palpitations and leg swelling.  Gastrointestinal: Positive for heartburn. Negative for abdominal pain and nausea.  Musculoskeletal: Negative for back pain, myalgias and neck pain.  Skin: Negative for itching and rash.  Neurological: Positive for speech change and focal weakness. Negative for dizziness and headaches.  Psychiatric/Behavioral: The patient is not nervous/anxious.   All other systems reviewed and are negative.         Past Medical History:  Diagnosis Date  . Abnormal TSH   . Acute gastric ulcer   . Acute gastritis with hemorrhage   . Alcohol abuse   . Alcohol dependence ([place]) 02/02/2014  . Anemia   . CHF (congestive heart failure) ([place])   . Cirrhosis ([place]) 06/04/2012  . Depressive disorder 02/01/2014  . Dizziness and giddiness 12/13/2014  . DVT of lower limb, acute ([place]) 06/06/2012  . Essential hypertension   . Fatty liver   . Gallstones   . Gastritis 06/07/2012  . GERD (gastroesophageal reflux disease)   . GI bleed due to NSAIDs 12/13/2014  . Granulomatous gastritis   . Hematuria 06/04/2012  . Hepatitis C   . Hepatocellular carcinoma ([place])   . Heroin abuse    "I haven't done that since I don't know when."  . Heroin overdose 02/20/2014  . LV dysfunction    a. 04/2015: EF 45-50% by cath.  . Neuropathy ([place])   . NSTEMI (non-ST elevated myocardial infarction) ([place])    a. 04/2015 -  patent coronaries. Etiology possibly due to coronary spasm versus embolus, stress cardiomyopathy (atypical), and aborted infarction related to plaque rupture with thrombosis and dissolution. Amlodipine started. Not on antiplatelets due to GIB/cirrhosis history.  . Oral thrush  06/05/2012  . Polysubstance abuse    Rare marijuana.  No EtOH x 2 months.   . Prolonged Q-T interval on ECG    a. 12/2014 - treated with magnesium.  . Right knee pain 12/13/2014  . S/P alcohol detoxification 02/02/2014  . SVT (supraventricular tachycardia) ([place])    a. 12/2014 in setting of GIB, ETOH, NSAIDS, gastritis.  . Symptomatic cholelithiasis 12/15/2013  . Thrombocytopenia ([place])   . Upper GI bleeding 12/13/2014  . Weight loss 06/04/2012         Past Surgical History:  Procedure Laterality Date  . CARDIAC CATHETERIZATION N/A 04/15/2015   Procedure: Left Heart Cath and Coronary Angiography;  Surgeon: Belva Crome, MD;  Location: [facility];  Service: Cardiovascular;  Laterality: N/A;  . CIRCUMCISION    . ESOPHAGOGASTRODUODENOSCOPY  06/07/2012   Procedure: ESOPHAGOGASTRODUODENOSCOPY (EGD);  Surgeon: Milus Banister, MD;  Location: [facility];  Service: Endoscopy;  Laterality: N/A;  may need treatment of varices  . ESOPHAGOGASTRODUODENOSCOPY N/A 12/14/2014   Procedure: ESOPHAGOGASTRODUODENOSCOPY (EGD);  Surgeon: Jerene Bears, MD;  [facility];  Service: Endoscopy;  Laterality: N/A;  . IR GENERIC HISTORICAL  06/25/2016   IR US GUIDE VASC ACCESS LEFT 06/25/2016 Corrie Mckusick, DO [facility]  . IR GENERIC HISTORICAL  06/25/2016   IR RADIOLOGY PERIPHERAL GUIDED IV START 06/25/2016 Corrie Mckusick, DO [facility]  . RADIOLOGY WITH ANESTHESIA N/A 06/25/2016   Procedure: RADIOLOGY WITH ANESTHESIA;  Surgeon: Luanne Bras, MD;  Location: [facility];  Service: Radiology;  Laterality: N/A;  . TONSILLECTOMY            Family History  Problem Relation Age of Onset  . Stroke Father   . Prostate cancer Brother   . Heart disease Mother     Pacemaker    Social History:  Married--reports wife in good health. He retired 2 years ago?  Independent with cane?walker? PTA--sedentary. Per  reports that he has quit smoking 50 years ago. His smoking use included  Cigarettes. He has a 2.50 pack-year smoking history. He has never used smokeless tobacco. He reports that he has not had a drink in 2 months? history of drinking 16.8 oz of alcohol per week . History of heroin OD "years ago". He reports that he does not use drugs.    Allergies: No Known Allergies          Medications Prior to Admission  Medication Sig Dispense Refill  . aspirin EC 325 MG tablet Take 1 tablet (325 mg total) by mouth daily. 30 tablet 0  . Cholecalciferol (VITAMIN D PO) Take 1 tablet by mouth daily.    . folic acid (FOLVITE) 1 MG tablet Take 1 tablet (1 mg total) by mouth daily. 30 tablet 0  . furosemide (LASIX) 40 MG tablet Take 1 tablet (40 mg total) by mouth daily. 30 tablet 0  . levothyroxine (SYNTHROID, LEVOTHROID) 50 MCG tablet Take 1 tablet (50 mcg total) by mouth daily before breakfast. 30 tablet 0  . magnesium oxide (MAG-OX) 400 (241.3 Mg) MG tablet Take 1 tablet (400 mg total) by mouth daily. 30 tablet 0  . metoprolol tartrate (LOPRESSOR) 25 MG tablet Take 1 tablet (25 mg total) by mouth 3 (three) times daily. 90 tablet 0  . Multiple  Vitamin (MULTIVITAMIN WITH MINERALS) TABS tablet Take 1 tablet by mouth daily. 30 tablet 0  . potassium chloride (K-DUR) 10 MEQ tablet Take 2 tablets (20 mEq total) by mouth daily. 60 tablet 0  . spironolactone (ALDACTONE) 25 MG tablet Take 0.5 tablets (12.5 mg total) by mouth daily. 30 tablet 0  . sucralfate (CARAFATE) 1 g tablet Take 1 tablet (1 g total) by mouth 2 (two) times daily. 60 tablet 0  . zolpidem (AMBIEN) 5 MG tablet Take 1 tablet (5 mg total) by mouth at bedtime as needed for sleep. 30 tablet 0  . lipase/protease/amylase (CREON) 12000 units CPEP capsule Take 1 capsule (12,000 Units total) by mouth 3 (three) times daily with meals. (Patient not taking: Reported on 06/25/2016) 270 capsule 0  . saccharomyces boulardii (FLORASTOR) 250 MG capsule Take 1 capsule (250 mg total) by mouth 2 (two) times daily. (Patient not taking:  Reported on 06/25/2016) 60 capsule 0  . thiamine 100 MG tablet Take 1 tablet (100 mg total) by mouth daily. (Patient not taking: Reported on 05/27/2016) 30 tablet 0    Home: [place] expects to be discharged to:: Private residence Living Arrangements: Spouse/significant other Available Help at Discharge: Family, Available 24 hours/day Type of Home: House Home Access: Stairs to enter [employer] of Steps: 4 Entrance Stairs-Rails: Right, Left, Can reach both Home Layout: One level Bathroom Shower/Tub: Multimedia programmer: Handicapped height Home Equipment: Radio producer - single point, Grab bars - tub/shower, Hand held shower head, Environmental consultant - 2 wheels  Functional History: Prior Function Level of Independence: Independent with assistive device(s) Comments: Used SPC for mobility but recently has been using RW  Functional Status:  Mobility: Bed Mobility Overal bed mobility: Needs Assistance Bed Mobility: Rolling, Sidelying to Sit, Sit to Sidelying Rolling: Min assist Sidelying to sit: Mod assist, HOB elevated Sit to sidelying: Min assist General bed mobility comments: + rail; rolled bil for cleaning (incontinent of BM on arrival); requires step by step cues Transfers Overall transfer level: Needs assistance Equipment used: None Transfers: Sit to/from Stand Sit to Stand: Mod assist, From elevated surface (ICU bed) General transfer comment: Stood without knee buckling, however flexed at hips and through torso; unsafe to pivot with +1 assist      ADL:    Cognition: Cognition Overall Cognitive Status: Impaired/Different from baseline Orientation Level: (P) Oriented X4 Cognition Arousal/Alertness: Awake/alert Behavior During Therapy: WFL for tasks assessed/performed Overall Cognitive Status: Impaired/Different from baseline Area of Impairment: Orientation, Problem solving, Following commands Orientation Level: Time (+month, +day but year 36;  +[facility] hospital; +stroke) Following Commands: Follows one step commands with increased time Problem Solving: Slow processing, Difficulty sequencing, Requires verbal cues, Requires tactile cues General Comments: difficulty moving himself with bed mobility and required frequent cues for sequencing   Blood pressure 120/79, pulse 80, temperature 98.7 F (37.1 C), temperature source Axillary, resp. rate (!) 24, height 5\' 10"  (1.778 m), weight 96.8 kg (213 lb 6.5 oz), SpO2 100 %. Physical Exam  Nursing note and vitals reviewed. Constitutional: He is oriented to person, place, and time. He appears well-developed and well-nourished. No distress. Nasal cannula in place.  HENT:  Head: Normocephalic and atraumatic.  Mouth/Throat: Oropharynx is clear and moist.  Eyes: Conjunctivae and EOM are normal. Pupils are equal, round, and reactive to light. Right eye exhibits no discharge. Left eye exhibits no discharge.  Neck: Normal range of motion. Neck supple.  Cardiovascular: Normal rate and regular rhythm.   Respiratory: Effort normal. No accessory  muscle usage or stridor. No respiratory distress. He has rales. He exhibits no tenderness.  Wet congested voice with weak cough and inability to clear secretions.  Suctioned without gag reflex.  +[place]  GI: Soft. Bowel sounds are normal.  Musculoskeletal: He exhibits edema (1+ edema left forearm. ). He exhibits no tenderness.  Neurological: He is alert and oriented to person, place, and time. A cranial nerve deficit is present.  Left inattention.  Left facial weakness with dysarthria.  Slow to initiated with occasional perseverative behaviors. Able to follow one and occasional two step commands. Lacks insight/awareness of deficits.  DTRs symmetric Motor: RUE: 4+/5 proximal to distal RLE: 4+/5 proximal to distal LUE: 2-/5 proximal to distal LLE: 3+/5 hip flexion, knee extension, 4+/5 ankle dorsi/plantar flexion  Skin: Skin is warm and dry. He is not  diaphoretic.  Psychiatric: He has a normal mood and affect. His behavior is normal.    Lab Results Last 24 Hours        Results for orders placed or performed during the hospital encounter of 06/25/16 (from the past 24 hour(s))  Culture, Urine     Status: None (Preliminary result)   Collection Time: 06/28/16  9:06 PM  Result Value Ref Range   Specimen Description URINE, CATHETERIZED    Special Requests zosyn, vancomycin    Culture PENDING    Report Status PENDING   Magnesium     Status: None   Collection Time: 06/29/16  3:32 AM  Result Value Ref Range   Magnesium 2.0 1.7 - 2.4 mg/dL  Renal function panel     Status: Abnormal   Collection Time: 06/29/16  3:32 AM  Result Value Ref Range   Sodium 140 135 - 145 mmol/L   Potassium 3.9 3.5 - 5.1 mmol/L   Chloride 118 (H) 101 - 111 mmol/L   CO2 14 (L) 22 - 32 mmol/L   Glucose, Bld 104 (H) 65 - 99 mg/dL   BUN 11 6 - 20 mg/dL   Creatinine, Ser 0.87 0.61 - 1.24 mg/dL   Calcium 7.8 (L) 8.9 - 10.3 mg/dL   Phosphorus 2.6 2.5 - 4.6 mg/dL   Albumin 1.8 (L) 3.5 - 5.0 g/dL   GFR calc non Af Amer >60 >60 mL/min   GFR calc Af Amer >60 >60 mL/min   Anion gap 8 5 - 15  CBC with Differential/Platelet     Status: Abnormal   Collection Time: 06/29/16  6:52 AM  Result Value Ref Range   WBC 8.6 4.0 - 10.5 K/uL   RBC 3.05 (L) 4.22 - 5.81 MIL/uL   Hemoglobin 10.1 (L) 13.0 - 17.0 g/dL   HCT 29.7 (L) 39.0 - 52.0 %   MCV 97.4 78.0 - 100.0 fL   MCH 33.1 26.0 - 34.0 pg   MCHC 34.0 30.0 - 36.0 g/dL   RDW 16.1 (H) 11.5 - 15.5 %   Platelets  150 - 400 K/uL    PLATELET CLUMPS NOTED ON SMEAR, COUNT APPEARS ADEQUATE   Neutrophils Relative % 75 %   Lymphocytes Relative 11 %   Monocytes Relative 13 %   Eosinophils Relative 1 %   Basophils Relative 0 %   Neutro Abs 6.5 1.7 - 7.7 K/uL   Lymphs Abs 0.9 0.7 - 4.0 K/uL   Monocytes Absolute 1.1 (H) 0.1 - 1.0 K/uL   Eosinophils Absolute 0.1 0.0 - 0.7 K/uL    Basophils Absolute 0.0 0.0 - 0.1 K/uL      Imaging Results (Last 48  hours)  Ct Angio Head W Or Wo Contrast  Result Date: 06/28/2016 CLINICAL DATA:  Stroke, followup evaluation. Status post revascularization of occluded RIGHT middle cerebral artery. EXAM: CT ANGIOGRAPHY HEAD AND NECK TECHNIQUE: Multidetector CT imaging of the head and neck was performed using the standard protocol during bolus administration of intravenous contrast. Multiplanar CT image reconstructions and MIPs were obtained to evaluate the vascular anatomy. Carotid stenosis measurements (when applicable) are obtained utilizing NASCET criteria, using the distal internal carotid diameter as the denominator. CONTRAST:  50 cc Isovue 370 COMPARISON:  MRI head June 26, 2016 and FINDINGS: CT HEAD BRAIN: Blurring of the RIGHT frontotemporal parietal gray-white matter differentiation, with patchy RIGHT basal ganglia and corona radiata hypodensity corresponding to known acute infarct. Local sulcal effacement without midline shift. No intraparenchymal hemorrhage. RIGHT frontal and LEFT parietal lobe encephalomalacia. Mild ventriculomegaly, with partially effaced RIGHT temporal horn, no hydrocephalus. No significant extra-axial fluid collections. Minimal LEFT frontal dural thickening or chronic subdural hematoma. Minimally effaced RIGHT basal cisterns. VASCULAR: Mild calcific atherosclerosis of the carotid siphons. SKULL: No skull fracture. Old bilateral distal nasal bone fractures. No significant scalp soft tissue swelling. SINUSES/ORBITS: The mastoid air-cells and included paranasal sinuses are well-aerated.The included ocular globes and orbital contents are non-suspicious. OTHER: None. CTA NECK AORTIC ARCH: Normal appearance of the thoracic arch, normal branch pattern. Mild calcific atherosclerosis. The origins of the innominate, left Common carotid artery and subclavian artery are widely patent. RIGHT CAROTID SYSTEM: Common carotid artery is  widely patent, coursing in a straight line fashion. Mild eccentric calcific atherosclerosis of RIGHT internal carotid artery origin. Normal appearance of the carotid bifurcation without hemodynamically significant stenosis by NASCET criteria. 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 No intramural hematoma. LEFT CAROTID SYSTEM: Common carotid artery is widely patent, coursing in a straight line fashion. Normal appearance of the carotid bifurcation without hemodynamically significant stenosis by NASCET criteria. Normal appearance of the included internal carotid artery. VERTEBRAL ARTERIES:Mild stenosis RIGHT vertebral artery origin. Codominant vertebral arteries. Vertebral arteries are patent throughout the course. Mild extrinsic deformity due to degenerative cervical spine. SKELETON: No acute osseous process though bone windows have not been submitted. Poor dentition. Subcentimeter sclerotic foci T4 and T5 vertebral bodies . OTHER NECK: Soft tissues of the neck are non-acute though, not tailored for evaluation. Lobulated RIGHT pleural effusion extending into the fissure. Small layering LEFT pleural effusion. CTA HEAD ANTERIOR CIRCULATION: Normal appearance of the cervical internal carotid arteries, petrous, cavernous and supra clinoid internal carotid arteries. Widely patent anterior communicating artery. RIGHT A1 segment is dominant. Patent bilateral anterior cerebral arteries. Bilateral middle cerebral arteries are patent. Mild stenosis distal RIGHT M1. Mild luminal irregularity RIGHT M2 segment. Occluded distal RIGHT M3 segment. No large vessel occlusion, hemodynamically significant stenosis, dissection, contrast extravasation or aneurysm. POSTERIOR CIRCULATION: Normal appearance of the vertebral arteries, vertebrobasilar junction and basilar artery, as well as main branch vessels. Normal appearance of the posterior cerebral arteries.  Bilateral posterior communicating arteries present. No large vessel occlusion, hemodynamically significant stenosis, dissection, luminal irregularity, contrast extravasation or aneurysm. VENOUS SINUSES: Major dural venous sinuses are patent though not tailored for evaluation on this angiographic examination. ANATOMIC VARIANTS: None. DELAYED PHASE: Mild RIGHT final cortical enhancement. Asymmetrically dense RIGHT middle cerebral artery segments on delayed phase. 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 LEFT frontal dural thickening versus chronic subdural hematoma. 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 Small bilateral pleural effusions. Sub cm probable bone islands T4 and T5 though, if there is a history of cancer, recommend bone scan. CTA HEAD: Occluded RIGHT M3 segment. Mild stenosis distal RIGHT M1 segment. Mild luminal irregularity of the RIGHT M2 segment may be due to extrinsic deformity, atherosclerosis or vasospasm. No large vessel occlusion. Electronically Signed   By: Elon Alas M.D.   On: 06/28/2016 07:02   Ct Angio Neck W Or Wo Contrast  Result Date: 06/28/2016 CLINICAL DATA:  Stroke, followup evaluation. Status post revascularization of occluded RIGHT middle cerebral artery. EXAM: CT ANGIOGRAPHY HEAD AND NECK TECHNIQUE: Multidetector CT imaging of the head and neck was performed using the standard protocol during bolus administration of intravenous contrast. Multiplanar CT image reconstructions and MIPs were obtained to evaluate the vascular anatomy. Carotid stenosis measurements (when applicable) are obtained utilizing NASCET criteria, using the distal internal carotid diameter as the denominator. CONTRAST:  50 cc Isovue 370 COMPARISON:  MRI head June 26, 2016 and FINDINGS: CT HEAD BRAIN: Blurring of the RIGHT frontotemporal  parietal gray-white matter differentiation, with patchy RIGHT basal ganglia and corona radiata hypodensity corresponding to known acute infarct. Local sulcal effacement without midline shift. No intraparenchymal hemorrhage. RIGHT frontal and LEFT parietal lobe encephalomalacia. Mild ventriculomegaly, with partially effaced RIGHT temporal horn, no hydrocephalus. No significant extra-axial fluid collections. Minimal LEFT frontal dural thickening or chronic subdural hematoma. Minimally effaced RIGHT basal cisterns. VASCULAR: Mild calcific atherosclerosis of the carotid siphons. SKULL: No skull fracture. Old bilateral distal nasal bone fractures. No significant scalp soft tissue swelling. SINUSES/ORBITS: The mastoid air-cells and included paranasal sinuses are well-aerated.The included ocular globes and orbital contents are non-suspicious. OTHER: None. CTA NECK AORTIC ARCH: Normal appearance of the thoracic arch, normal branch pattern. Mild calcific atherosclerosis. The origins of the innominate, left Common carotid artery and subclavian artery are widely patent. RIGHT CAROTID SYSTEM: Common carotid artery is widely patent, coursing in a straight line fashion. Mild eccentric calcific atherosclerosis of RIGHT internal carotid artery origin. Normal appearance of the carotid bifurcation without hemodynamically significant stenosis by NASCET criteria. 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 No intramural hematoma. LEFT CAROTID SYSTEM: Common carotid artery is widely patent, coursing in a straight line fashion. Normal appearance of the carotid bifurcation without hemodynamically significant stenosis by NASCET criteria. Normal appearance of the included internal carotid artery. VERTEBRAL ARTERIES:Mild stenosis RIGHT vertebral artery origin. Codominant vertebral arteries. Vertebral arteries are patent throughout the course. Mild extrinsic deformity  due to degenerative cervical spine. SKELETON: No acute osseous process though bone windows have not been submitted. Poor dentition. Subcentimeter sclerotic foci T4 and T5 vertebral bodies . OTHER NECK: Soft tissues of the neck are non-acute though, not tailored for evaluation. Lobulated RIGHT pleural effusion extending into the fissure. Small layering LEFT pleural effusion. CTA HEAD ANTERIOR CIRCULATION: Normal appearance of the cervical internal carotid arteries, petrous, cavernous and supra clinoid internal carotid arteries. Widely patent anterior communicating artery. RIGHT A1 segment is dominant. Patent bilateral anterior cerebral arteries. Bilateral middle cerebral arteries are patent. Mild stenosis distal RIGHT M1. Mild luminal irregularity RIGHT M2 segment. Occluded distal RIGHT M3 segment. No large vessel occlusion, hemodynamically significant stenosis, dissection, contrast extravasation or aneurysm. POSTERIOR CIRCULATION: Normal appearance of the vertebral arteries, vertebrobasilar junction and basilar artery, as well as main branch vessels. Normal appearance of the posterior cerebral arteries. Bilateral posterior communicating arteries  present. No large vessel occlusion, hemodynamically significant stenosis, dissection, luminal irregularity, contrast extravasation or aneurysm. VENOUS SINUSES: Major dural venous sinuses are patent though not tailored for evaluation on this angiographic examination. ANATOMIC VARIANTS: None. DELAYED PHASE: Mild RIGHT final cortical enhancement. Asymmetrically dense RIGHT middle cerebral artery segments on delayed phase. 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 LEFT frontal dural thickening versus chronic subdural hematoma. 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 Small bilateral pleural effusions. Sub cm probable bone  islands T4 and T5 though, if there is a history of cancer, recommend bone scan. CTA HEAD: Occluded RIGHT M3 segment. Mild stenosis distal RIGHT M1 segment. Mild luminal irregularity of the RIGHT M2 segment may be due to extrinsic deformity, atherosclerosis or vasospasm. No large vessel occlusion. Electronically Signed   By: Elon Alas M.D.   On: 06/28/2016 07:02     Assessment/Plan: Diagnosis: Right MCA infarct Labs and images independently reviewed.  Records reviewed and summated above. Stroke: Continue secondary stroke prophylaxis and Risk Factor Modification listed below:   Antiplatelet therapy:   Blood Pressure Management:  Continue current medication with prn's with permisive HTN per primary team Statin Agent:   Left sided hemiparesis: fit for orthosis to prevent contractures (resting hand splint for day, wrist cock up splint at night, etc) Motor recovery: Fluoxetine  1. Does the need for close, 24 hr/day medical supervision in concert with the patient's rehab needs make it unreasonable for this patient to be served in a less intensive setting? Yes  2. Co-Morbidities requiring supervision/potential complications: NPO (advance diet as tolerated), fever (cont to monitor), Afib (monitor HR with increased activity), alcoholic cirrhosis (monitor labs, avoid hepatotoxic meds), ascites/thrombocytopenia (<60,000/mm3 no resistive exercise), GIB (follow Hb), polysubstance abuse (counsel when appropriate), Hep C (avoid hepatotoxic meds), hepatocellular CA (see previous), chronic combined CHF with MVR (Monitor in accordance with increased physical activity and avoid UE resistance excercises), tachypnea (monitor RR and O2 Sats with increased physical exertion), ABLA (transfuse if necessary to ensure appropriate perfusion for increased activity tolerance), leukocytosis (cont to monitor for signs and symptoms of infection, further workup if indicated) 3. Due to safety, skin/wound care, disease  management, medication administration and patient education, does the patient require 24 hr/day rehab nursing? Yes 4. Does the patient require coordinated care of a physician, rehab nurse, PT (1-2 hrs/day, 5 days/week), OT (1-2 hrs/day, 5 days/week) and SLP (1-2 hrs/day, 5 days/week) to address physical and functional deficits in the context of the above medical diagnosis(es)? Yes Addressing deficits in the following areas: balance, endurance, locomotion, strength, transferring, bathing, dressing, toileting, swallowing and psychosocial support 5. Can the patient actively participate in an intensive therapy program of at least 3 hrs of therapy per day at least 5 days per week? Yes 6. The potential for patient to make measurable gains while on inpatient rehab is excellent 7. Anticipated functional outcomes upon discharge from inpatient rehab are min assist  with PT, supervision and min assist with OT, modified independent and supervision with SLP. 8. Estimated rehab length of stay to reach the above functional goals is: 20-24 days. 9. Does the patient have adequate social supports and living environment to accommodate these discharge functional goals? Potentially 10. Anticipated D/C setting: Home 11. Anticipated post D/C treatments: HH therapy and Home excercise program 12. Overall Rehab/Functional Prognosis: good  RECOMMENDATIONS: This patient's condition is appropriate for continued rehabilitative care in  the following setting: Likely CIR, will request more complete eval by therapies and determine caregiver support at discharge.  Will also need means of nutrition. Patient has agreed to participate in recommended program. Yes Note that insurance prior authorization may be required for reimbursement for recommended care.  Comment: Rehab Admissions Coordinator to follow up.  Delice Lesch, MD, [facility] 06/29/2016    Revision History                             Routing History

## 2016-07-06 NOTE — Evaluation (Signed)
Speech Language Pathology Assessment and Plan  Patient Details  Name: Nathan Frank MRN: [id number] Date of Birth: 1941/04/26  SLP Diagnosis: Cognitive Impairments;Speech and Language deficits;Dysarthria  Rehab Potential: Good ELOS: 20-24 days    Today's Date: 07/06/2016 SLP Individual Time: 1403-1500 SLP Individual Time Calculation (min): 57 min    Problem List:  Patient Active Problem List   Diagnosis Date Noted  . Left hemiparesis ([place]) 07/04/2016  . Acute ischemic right MCA stroke ([place]) 07/04/2016  . Obesity 07/01/2016  . Family hx-stroke 07/01/2016  . Hyperglycemia 07/01/2016  . Wound of left leg 07/01/2016  . Dysphagia, post-stroke   . Dysarthria, post-stroke   . Pyrexia   . Bleeding gastrointestinal   . Polysubstance abuse   . Acute on chronic combined systolic and diastolic heart failure ([place])   . Mitral valve regurgitation   . Tachypnea   . Anemia of chronic disease   . Leukocytosis   . 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 Hypomagnesemia 06/09/2016  . Acute on chronic diastolic CHF (congestive heart failure) ([place]) 06/09/2016  . Alcoholic cirrhosis of liver with ascites ([place])   . Other specified hypothyroidism   . Hepatocellular carcinoma ([place])   . Alcohol withdrawal ([place]) 05/27/2016  . Generalized weakness 05/27/2016  . Hypokalemia 05/27/2016  . Dehydration 05/27/2016  . Diarrhea 04/28/2016  . SIRS (systemic inflammatory response syndrome) ([place]) 04/19/2016  . Alcohol use disorder, moderate, in controlled environment, dependence ([place])   . Lactic acidosis   . Tachycardia   . Atrial fibrillation with RVR ([place]) 04/18/2016  . Chest pain 04/18/2016  . Hypotension 04/18/2016  . Elevated lactic acid level 04/18/2016  . Chronic atrial fibrillation ([place]) 04/18/2016  . SVT (supraventricular tachycardia) ([place])   . Prolonged Q-T interval on ECG   . Neuropathy ([place])   . Essential  hypertension   . Thrombocytopenia ([place]) 04/16/2015  . Abnormal TSH 04/16/2015  . NSTEMI (non-ST elevated myocardial infarction) ([place])   . Acute gastric ulcer   . Acute gastritis with hemorrhage   . GI bleed due to NSAIDs 12/13/2014  . Right knee pain 12/13/2014  . Dizziness and giddiness 12/13/2014  . Upper GI bleeding 12/13/2014  . Heroin overdose 02/20/2014  . S/P alcohol detoxification 02/02/2014  . Alcohol dependence ([place]) 02/02/2014  . Depressive disorder 02/01/2014  . Symptomatic cholelithiasis 12/15/2013  . Abdominal pain 12/14/2013  . Gastritis 06/07/2012  . DVT of lower limb, acute ([place]) 06/06/2012  . Oral thrush 06/05/2012  . Alcohol abuse 06/04/2012  . Weight loss 06/04/2012  . 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 Liver cirrhosis ([place]) 06/04/2012  . Hematuria 06/04/2012   Past Medical History:  Past Medical History:  Diagnosis Date  . Abnormal TSH   . Acute gastric ulcer   . Acute gastritis with hemorrhage   . Alcohol abuse   . Alcohol dependence ([place]) 02/02/2014  . Anemia   . CHF (congestive heart failure) ([place])   . Cirrhosis ([place]) 06/04/2012  . Depressive disorder 02/01/2014  . Dizziness and giddiness 12/13/2014  . DVT of lower limb, acute ([place]) 06/06/2012  . Essential hypertension   . Fatty liver   . Gallstones   . Gastritis 06/07/2012  . GERD (gastroesophageal reflux disease)   . GI bleed due to NSAIDs 12/13/2014  . Granulomatous gastritis   . Hematuria 06/04/2012  . Hepatitis C   . Hepatocellular carcinoma ([place])   . Heroin abuse    "  I haven't done that since I don't know when."  . Heroin overdose 02/20/2014  . LV dysfunction    a. 04/2015: EF 45-50% by cath.  . Neuropathy ([place])   . NSTEMI (non-ST elevated myocardial infarction) ([place])    a. 04/2015 - patent coronaries. Etiology possibly due to coronary spasm versus embolus, stress cardiomyopathy (atypical), and aborted infarction related to plaque rupture with thrombosis and  dissolution. Amlodipine started. Not on antiplatelets due to GIB/cirrhosis history.  . Oral thrush 06/05/2012  . Polysubstance abuse    Rare marijuana.  No EtOH x 2 months.   . Prolonged Q-T interval on ECG    a. 12/2014 - treated with magnesium.  . Right knee pain 12/13/2014  . S/P alcohol detoxification 02/02/2014  . SVT (supraventricular tachycardia) ([place])    a. 12/2014 in setting of GIB, ETOH, NSAIDS, gastritis.  . Symptomatic cholelithiasis 12/15/2013  . Thrombocytopenia ([place])   . Upper GI bleeding 12/13/2014  . Weight loss 06/04/2012   Past Surgical History:  Past Surgical History:  Procedure Laterality Date  . CARDIAC CATHETERIZATION N/A 04/15/2015   Procedure: Left Heart Cath and Coronary Angiography;  Surgeon: Belva Crome, MD;  Location: [facility];  Service: Cardiovascular;  Laterality: N/A;  . CIRCUMCISION    . ESOPHAGOGASTRODUODENOSCOPY  06/07/2012   Procedure: ESOPHAGOGASTRODUODENOSCOPY (EGD);  Surgeon: Milus Banister, MD;  Location: [facility];  Service: Endoscopy;  Laterality: N/A;  may need treatment of varices  . ESOPHAGOGASTRODUODENOSCOPY N/A 12/14/2014   Procedure: ESOPHAGOGASTRODUODENOSCOPY (EGD);  Surgeon: Jerene Bears, MD;  Location[facility] END[facility]COPY;  Service: Endoscopy;  Laterality: N/A;  . IR GENERIC HISTORICAL  06/25/2016   IR US GUIDE VASC ACCESS LEFT 06/25/2016 Corrie Mckusick, DO [facility]  . IR GENERIC HISTORICAL  06/25/2016   IR RADIOLOGY PERIPHERAL GUIDED IV START 06/25/2016 Corrie Mckusick, DO [facility]  . IR GENERIC HISTORICAL  06/25/2016   IR PERCUTANEOUS ART THROMBECTOMY/INFUSION INTRACRANIAL INC DIAG ANGIO 06/25/2016 Luanne Bras, MD [facility]  . RADIOLOGY WITH ANESTHESIA N/A 06/25/2016   Procedure: RADIOLOGY WITH ANESTHESIA;  Surgeon: Luanne Bras, MD;  Location: [facility];  Service: Radiology;  Laterality: N/A;  . TONSILLECTOMY      Assessment / Plan / Recommendation Clinical Impression Patient is a 75 y.o.RH-male with history of Afib,  alcoholic cirrhosis with ascites/thrombocytopenia, GIB, polysubstance abuse, Hep C, hepatocellular CA, A fib (no anticoagulation due to non-compliance),  most recent admission for acute on chronic CHF. He was admitted on 06/25/16 after being found on the floor with left sided weakness, right gaze preference and facial droop with dysarthria. 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 He underwent cerebral angio with thrombectomy of R-MCA clot and IA Integrilin. 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 2D echo with EF 40-45% and mild MVR. He tolerated extubation on 09/23 but noted to have weak cough with difficulty handling secretions. He developed fever and was started on IV Vanc/Zosyn.    Patient was administered a cognitive-linguistic evaluation and demonstrates severe cognitive impairments characterized by delayed processing, impaired initiation, impaired visuospatial awareness, impaired word retrieval, impaired problem solving, impaired intellectual awareness and impaired safety awareness which impacts his ability to complete basic problem solving tasks safely. Patient's intelligibility is impacted by imprecise consonants due to left oral-motor weakness and decreased ROM. Patient consumed thin liquids via cup and straw and regular textures without overt s/s of aspiration or significant oral residue; however, given cognitive deficits and  limits trials today, recommend current Dys. 3 diet recommendations. Patient would benefit from skilled SLP intervention to maximize his awareness of cognitive deficits, functional communication, and overall functional independence prior to discharge. Anticipate patient will require 24/7 supervision and f/u therapy upon d/c.   Skilled Therapeutic Interventions          Bedside swallow and cognitive linguistic evaluations completed with results recommendations reviewed with patient. Skilled  treatment focused on stimulability for visuospatial reasoning given Mod A visual and verbal cues during a structured task.    SLP Assessment  Patient will need skilled Speech Lanaguage Pathology Services during CIR admission    Recommendations  SLP Diet Recommendations: Dysphagia 3 (Mech soft);Thin Liquid Administration via: Cup;Straw Medication Administration: Whole meds with liquid (one at a time) Supervision: Intermittent supervision to cue for compensatory strategies (Set-up ) Compensations: Slow rate;Small sips/bites;Minimize environmental distractions Postural Changes and/or Swallow Maneuvers: Out of bed for meals;Seated upright 90 degrees Oral Care Recommendations: Oral care BID Patient destination: Home Follow up Recommendations: 24 hour supervision/assistance;Home Health SLP;Outpatient SLP Equipment Recommended: None recommended by SLP    SLP Frequency 3 to 5 out of 7 days   SLP Duration  SLP Intensity  SLP Treatment/Interventions 20-24 days  Minumum of 1-2 x/day, 30 to 90 minutes  Cognitive remediation/compensation;Cueing hierarchy;Dysphagia/aspiration precaution training;Environmental controls;Functional tasks;Internal/external aids;Medication managment;Patient/family education;Speech/Language facilitation;Therapeutic Activities    Pain Pain Assessment Pain Assessment: No/denies pain  Prior Functioning Cognitive/Linguistic Baseline: Within functional limits Type of Home: House  Lives With: Spouse Available Help at Discharge: Family;Available 24 hours/day Education: 4 years college degree Vocation: Retired  Function:  Eating Eating   Modified Consistency Diet: No (trials with SLP) Eating Assist Level: Set up assist for;Supervision or verbal cues;Helper checks for pocketed food   Eating Set Up Assist For: Opening containers       Cognition Comprehension Comprehension assist level: Understands basic 50 - 74% of the time/ requires cueing 25 - 49% of the time   Expression   Expression assist level: Expresses basic 50 - 74% of the time/requires cueing 25 - 49% of the time. Needs to repeat parts of sentences.  Social Interaction Social Interaction assist level: Interacts appropriately 75 - 89% of the time - Needs redirection for appropriate language or to initiate interaction.  Problem Solving Problem solving assist level: Solves basic 25 - 49% of the time - needs direction more than half the time to initiate, plan or complete simple activities  Memory Memory assist level: Recognizes or recalls 50 - 74% of the time/requires cueing 25 - 49% of the time   Short Term Goals: Week 1: SLP Short Term Goal 1 (Week 1): Patient will initiate functional tasks with no more than 2 repetition cues in 50% of given opportunities. SLP Short Term Goal 2 (Week 1): Patient will utilize compensatory strategies for naming retrieval at the sentence level given Mod A verbal and gesteral cues.  SLP Short Term Goal 3 (Week 1): Patient will demonstrate functional problem solving during basic tasks given Mod A verbal and visual cues.  SLP Short Term Goal 4 (Week 1): Patient will demonstrate intellectual awareness of 2 cognitive and 2 physical deficits given Mod A verbal cues.  SLP Short Term Goal 5 (Week 1): Patient will consume trials of regular textures and thin liquids via straw, demonstrating efficient mastication and oral clearance with no overt s/s of aspiration. SLP Short Term Goal 6 (Week 1): Patient will demonstrate 80% intelligibility at the sentence level given Min A verbal cues  for overarticulation.   Refer to Care Plan for Long Term Goals  Recommendations for other services: None  Discharge Criteria: Patient will be discharged from SLP if patient refuses treatment 3 consecutive times without medical reason, if treatment goals not met, if there is a change in medical status, if patient makes no progress towards goals or if patient is discharged from hospital.  The  above assessment, treatment plan, treatment alternatives and goals were discussed and mutually agreed upon: by patient  Thornton Papas 07/06/2016, 5:16 PM

## 2016-07-06 NOTE — Progress Notes (Signed)
[facility] PHYSICAL MEDICINE & REHABILITATION     PROGRESS NOTE    Subjective/Complaints: Had a reasonable night. Able to sleep. Denies any breathing problems. Belly pain. Still feels weak  ROS: Pt denies fever, rash/itching, headache, blurred or double vision, nausea, vomiting, abdominal pain, diarrhea, chest pain, shortness of breath, palpitations, dysuria, dizziness, neck or back pain, bleeding, anxiety, or depression   Objective: Vital Signs: Blood pressure (!) 130/97, pulse 85, temperature 97.5 F (36.4 C), temperature source Oral, resp. rate 16, height 6' (1.829 m), weight 103.8 kg (228 lb 13.4 oz), SpO2 100 %. No results found.  Recent Labs  07/04/16 0649 07/05/16 1243  WBC 6.9 5.7  HGB 9.5* 10.0*  HCT 28.7* 30.8*  PLT 174 230    Recent Labs  07/04/16 0649 07/05/16 1243  NA 142 138  K 3.1* 3.0*  CL 119* 117*  GLUCOSE 95 111*  BUN 21* 19  CREATININE 1.68* 1.67*  CALCIUM 8.3* 8.4*   CBG (last 3)   Recent Labs  07/03/16 1751 07/03/16 2248 07/04/16 0649  GLUCAP 109* 97 91    Wt Readings from Last 3 Encounters:  07/04/16 103.8 kg (228 lb 13.4 oz)  06/30/16 103.8 kg (228 lb 14.4 oz)  06/16/16 92.6 kg (204 lb 3.2 oz)    Physical Exam:  Constitutional: He is oriented to person, place, and time. He appears well-developed and well-nourished.  HENT:  Head: Normocephalic and atraumatic.  Eyes: Conjunctivae are normal. Pupils are equal, round, and reactive to light.  Neck: Normal range of motion. Neck supple.  Cardiovascular: Normal rate.  An irregular rhythm present. Exam reveals no gallop and no friction rub.   No murmur heard. Respiratory: Effort normal. No stridor. No respiratory distress. He has no wheezes. He has rhonchi. He has no rales.  GI: Soft. Bowel sounds are increased. There is no tenderness. There is no rebound and no guarding.  Mild distention  Ext: 1+ edema RUE Neurological: He is alert and oriented to person, place, and time.  Left  facial weakness with minimal dysarthria and occasional wet voice. Flat affect with delayed processing and slow movements.Fair insight and awareness. He is able to follow simple one and two step motor commands.  Left sided weakness: LUW 2/5 deltoid, bicep, tricep, wrist, hand, 1+ to 2-/5 HF, KE, ADF/PF. Senses pain Left arm and leg.   Skin: Skin is warm and dry. No rash noted.  BLE with dry flaky skin.  Psychiatric: His affect is blunt. His speech is delayed. He is slowed.   Assessment/Plan: 1. 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 Physiatrist is providing close team supervision and 24 hour management of active medical problems listed below. Physiatrist and rehab team continue to assess barriers to discharge/monitor patient progress toward functional and medical goals.  Function:  Bathing Bathing position   Position: Wheelchair/chair at sink  Bathing parts Body parts bathed by patient: Left arm, Chest, Abdomen, Front perineal area, Right upper leg, Left upper leg Body parts bathed by helper: Buttocks, Right arm, Right lower leg, Left lower leg, Back  Bathing assist Assist Level: Touching or steadying assistance(Pt > 75%)      Upper Body Dressing/Undressing Upper body dressing   What is the patient wearing?: Hospital gown                Upper body assist Assist Level: Touching or steadying assistance(Pt >  75%)      Lower Body Dressing/Undressing Lower body dressing   What is the patient wearing?: Underwear, Non-skid slipper socks   Underwear - Performed by helper: Thread/unthread right underwear leg, Thread/unthread left underwear leg, Pull underwear up/down       Non-skid slipper socks- Performed by helper: Don/doff right sock, Don/doff left sock                  Lower body assist Assist for lower body dressing: Touching or  steadying assistance (Pt > 75%)      Toileting Toileting          Toileting assist     Transfers Chair/bed transfer   Chair/bed transfer method: Stand pivot Chair/bed transfer assist level: Maximal assist (Pt 25 - 49%/lift and lower) Chair/bed transfer assistive device: Armrests     Locomotion Ambulation     Max distance: 2 Assist level: Maximal assist (Pt 25 - 49%)   Wheelchair   Type: Manual Max wheelchair distance: 10 Assist Level: Maximal assistance (Pt 25 - 49%)  Cognition Comprehension Comprehension assist level: Understands basic 50 - 74% of the time/ requires cueing 25 - 49% of the time  Expression Expression assist level: Expresses basic 50 - 74% of the time/requires cueing 25 - 49% of the time. Needs to repeat parts of sentences.  Social Interaction Social Interaction assist level: Interacts appropriately 90% of the time - Needs monitoring or encouragement for participation or interaction.  Problem Solving Problem solving assist level: Solves basic 50 - 74% of the time/requires cueing 25 - 49% of the time  Memory Memory assist level: Recognizes or recalls 75 - 89% of the time/requires cueing 10 - 24% of the time   Medical Problem List and Plan: 1.  Left hemiparesis and funtctional deficits secondary to right MCA embolic infarct  -Continue CIR PT, OT 2.  DVT Prophylaxis/Anticoagulation:   Lovenox 3. Pain Management: tylenol prn. Support limbs as needed to maintain joint position and prevent contracture 4. Mood: team to follow for ego support. SW to assess upon admit.  5. Neuropsych: This patient is not yet capable of making decisions on his own behalf. 6. Skin/Wound Care: encourage nutrition and pressure relief as needed 7. Fluids/Electrolytes/Nutrition: encourage PO. Check electrolytes upon admit 8. Afib: ASA daily             -metoprolol increased to 50mg  BID for better rate control Vitals:   07/06/16 0458 07/06/16 0909  BP: 115/78 (!) 130/97  Pulse: 74 85   Resp: 16   Temp: 97.5 F (36.4 C)                -no evidence of bradycardia             -adjust regimen as needed 9. AKI: responding to IV hydration             -continue to push pos  -await labs today.  10. Hypokalemia: continue to replete potassium             -Repeat BMET in am 11. Anemia:  Hgb 14.1 at admission down to --->8.5. 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             -  monitor LFT's and ammonia as needed                    -labs recently improved 13. HTN--metoprolol LOS (Days) 2 A FACE TO FACE EVALUATION WAS PERFORMED  Alysia Penna E 07/06/2016 1:17 PM

## 2016-07-06 NOTE — Progress Notes (Signed)
[facility] Individual Statement of Services  Patient Name:  Nathan Frank  Date:  07/06/2016  Welcome to the [facility].  Our goal is to provide you with an individualized program based on your diagnosis and situation, designed to meet your specific needs.  With this comprehensive rehabilitation program, you will be expected to participate in at least 3 hours of rehabilitation therapies Monday-Friday, with modified therapy programming on the weekends.  Your rehabilitation program will include the following services:  Physical Therapy (PT), Occupational Therapy (OT), Speech Therapy (ST), 24 hour per day rehabilitation nursing, Neuropsychology, Case Management (Social Worker), Rehabilitation Medicine, Nutrition Services and Pharmacy Services  Weekly team conferences will be held on Wednesdays to discuss your progress.  Your Social Worker will talk with you frequently to get your input and to update you on team discussions.  Team conferences with you and your family in attendance may also be held.  Expected length of stay:  24 to 27 days  Overall anticipated outcome:  Supervision with minimal assistance lower body dressing and toileting  Depending on your progress and recovery, your program may change. Your Social Worker will coordinate services and will keep you informed of any changes. Your Social Worker's name and contact numbers are listed  below.  The following services may also be recommended but are not provided by the [facility] will be made to provide these services after discharge if needed.  Arrangements include referral to agencies that provide these services.  Your insurance has been verified to be:  [employer] Medicare and Medicaid Your primary doctor is:  Dr. Thressa Sheller  Pertinent information  will be shared with your doctor and your insurance company.  Social Worker:  Alfonse Alpers, LCSW  [phone] or ([phone]  Information discussed with and copy given to patient by: Trey Sailors, 07/06/2016, 2:47 PM

## 2016-07-06 NOTE — Progress Notes (Signed)
Social Work Assessment and Plan  Patient Details  Name: Nathan Frank MRN: [id number] Date of Birth: [birth year]  Today's Date: 07/06/2016  Problem List:  Patient Active Problem List   Diagnosis Date Noted  . Left hemiparesis ([place]) 07/04/2016  . Acute ischemic right MCA stroke ([place]) 07/04/2016  . Obesity 07/01/2016  . Family hx-stroke 07/01/2016  . Hyperglycemia 07/01/2016  . Wound of left leg 07/01/2016  . Dysphagia, post-stroke   . Dysarthria, post-stroke   . Pyrexia   . Bleeding gastrointestinal   . Polysubstance abuse   . Acute on chronic combined systolic and diastolic heart failure ([place])   . Mitral valve regurgitation   . Tachypnea   . Anemia of chronic disease   . Leukocytosis   . Cerebral infarction due to embolism of right middle cerebral artery ([place]) - s/p mechanical thrombectomy 06/25/2016  . Hypomagnesemia 06/09/2016  . Acute on chronic diastolic CHF (congestive heart failure) ([place]) 06/09/2016  . Alcoholic cirrhosis of liver with ascites ([place])   . Other specified hypothyroidism   . Hepatocellular carcinoma ([place])   . Alcohol withdrawal ([place]) 05/27/2016  . Generalized weakness 05/27/2016  . Hypokalemia 05/27/2016  . Dehydration 05/27/2016  . Diarrhea 04/28/2016  . SIRS (systemic inflammatory response syndrome) ([place]) 04/19/2016  . Alcohol use disorder, moderate, in controlled environment, dependence ([place])   . Lactic acidosis   . Tachycardia   . Atrial fibrillation with RVR ([place]) 04/18/2016  . Chest pain 04/18/2016  . Hypotension 04/18/2016  . Elevated lactic acid level 04/18/2016  . Chronic atrial fibrillation ([place]) 04/18/2016  . SVT (supraventricular tachycardia) ([place])   . Prolonged Q-T interval on ECG   . Neuropathy ([place])   . Essential hypertension   . Thrombocytopenia ([place]) 04/16/2015  . Abnormal TSH 04/16/2015  . NSTEMI (non-ST elevated myocardial infarction) ([place])   . Acute gastric ulcer   . Acute gastritis with hemorrhage   . GI bleed due to  NSAIDs 12/13/2014  . Right knee pain 12/13/2014  . Dizziness and giddiness 12/13/2014  . Upper GI bleeding 12/13/2014  . Heroin overdose 02/20/2014  . S/P alcohol detoxification 02/02/2014  . Alcohol dependence ([place]) 02/02/2014  . Depressive disorder 02/01/2014  . Symptomatic cholelithiasis 12/15/2013  . Abdominal pain 12/14/2013  . Gastritis 06/07/2012  . DVT of lower limb, acute ([place]) 06/06/2012  . Oral thrush 06/05/2012  . Alcohol abuse 06/04/2012  . Weight loss 06/04/2012  . 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 Liver cirrhosis ([place]) 06/04/2012  . Hematuria 06/04/2012   Past Medical History:  Past Medical History:  Diagnosis Date  . Abnormal TSH   . Acute gastric ulcer   . Acute gastritis with hemorrhage   . Alcohol abuse   . Alcohol dependence ([place]) 02/02/2014  . Anemia   . CHF (congestive heart failure) ([place])   . Cirrhosis ([place]) 06/04/2012  . Depressive disorder 02/01/2014  . Dizziness and giddiness 12/13/2014  . DVT of lower limb, acute ([place]) 06/06/2012  . Essential hypertension   . Fatty liver   . Gallstones   . Gastritis 06/07/2012  . GERD (gastroesophageal reflux disease)   . GI bleed due to NSAIDs 12/13/2014  . Granulomatous gastritis   . Hematuria 06/04/2012  . Hepatitis C   . Hepatocellular carcinoma ([place])   . Heroin abuse    "I haven't done that since I don't know when."  . Heroin overdose 02/20/2014  . LV dysfunction    a. 04/2015: EF 45-50% by cath.  Marland Kitchen  Neuropathy (H[facility])   . NSTEMI (non-ST elevated myocardial infarction) ([facility])    a. 04/2015 - patent coronaries. Etiology possibly due to coronary spasm versus embolus, stress cardiomyopathy (atypical), and aborted infarction related to plaque rupture with thrombosis and dissolution. Amlodipine started. Not on antiplatelets due to GIB/cirrhosis history.  . Oral thrush 06/05/2012  . Polysubstance abuse    Rare marijuana.  No EtOH x 2 months.   . Prolonged Q-T interval on ECG    a. 12/2014  - treated with magnesium.  . Right knee pain 12/13/2014  . S/P alcohol detoxification 02/02/2014  . SVT (supraventricular tachycardia) ([facility])    a. 12/2014 in setting of GIB, ETOH, NSAIDS, gastritis.  . Symptomatic cholelithiasis 12/15/2013  . Thrombocytopenia ([facility])   . Upper GI bleeding 12/13/2014  . Weight loss 06/04/2012   Past Surgical History:  Past Surgical History:  Procedure Laterality Date  . CARDIAC CATHETERIZATION N/A 04/15/2015   Procedure: Left Heart Cath and Coronary Angiography;  Surgeon: Henry W Smith, MD;  Location: [facility];  Service: Cardiovascular;  Laterality: N/A;  . CIRCUMCISION    . ESOPHAGOGASTRODUODENOSCOPY  06/07/2012   Procedure: ESOPHAGOGASTRODUODENOSCOPY (EGD);  Surgeon: Daniel P Jacobs, MD;  Location: [facility] ENDOSCOPY;  Service: Endoscopy;  Laterality: N/A;  may need treatment of varices  . ESOPHAGOGASTRODUODENOSCOPY N/A 12/14/2014   Procedure: ESOPHAGOGASTRODUODENOSCOPY (EGD);  Surgeon: Jay M Pyrtle, MD;  Location: [facility] ENDOS[facility];  Service: Endoscopy;  Laterality: N/A;  . IR GENERIC HISTORICAL  06/25/2016   IR US GUIDE VASC ACCESS LEFT 06/25/2016 Jaime Wagner, DO [facility]  . IR GENERIC HISTORICAL  06/25/2016   IR RADIOLOGY PERIPHERAL GUIDED IV START 06/25/2016 Jaime Wagner, DO [facility]  . IR GENERIC HISTORICAL  06/25/2016   IR PERCUTANEOUS ART THROMBECTOMY/INFUSION INTRACRANIAL INC DIAG ANGIO 06/25/2016 Sanjeev Deveshwar, MD [facility]  . RADIOLOGY WITH ANESTHESIA N/A 06/25/2016   Procedure: RADIOLOGY WITH ANESTHESIA;  Surgeon: Sanjeev Deveshwar, MD;  Location: [facility] OR;  Service: Radiology;  Laterality: N/A;  . TONSILLECTOMY     Social History:  reports that he has quit smoking. His smoking use included Cigarettes. He has a 2.50 pack-year smoking history. He has never used smokeless tobacco. He reports that he drinks about 16.8 oz of alcohol per week . He reports that he does not use drugs.  Family / Support Systems Marital Status: Married How Long?: 49  years Patient Roles: Spouse Spouse/Significant Other: Yvonne Malburg - wife - [phone] (m) Children: Kelson Peedin - son - [phone] Other Supports: two other children Anticipated Caregiver: wife Ability/Limitations of Caregiver: Wife does not work and can assist. Caregiver Availability: 24/7 Family Dynamics: supportive family  Social History Preferred language: English Religion: Patient Refused Read: Yes Write: Yes Employment Status: Retired Date Retired/Disabled/Unemployed: 2002 Age Retired: 59 Legal History/Current Legal Issues: none reported Guardian/Conservator: MD reported that pt is not yet capable of making his own decisions.   Abuse/Neglect Physical Abuse: Denies Verbal Abuse: Denies Sexual Abuse: Denies Exploitation of patient/patient's resources: Denies Self-Neglect: Denies  Emotional Status Pt's affect, behavior and adjustment status: Pt is motivated to get well and remains positive about his recovery, as he has already seen progress.  He does not report feeling down, anxious, frustrated.  Recent Psychosocial Issues: Pt with multiple medical uses, but he remains positive about their recovery. Psychiatric History: none reported, but chart lists depressive disorder Substance Abuse History: Pt has a history of substance abuse.  He did not disclose drug use, but admitted to alcohol abuse and   stated that he stopped drinking on his own 3 weeks ago.  Pt has gone to AA and NA in the past, but did not fnd them helpful.  He relies on himself, his family, and faith.  Patient / Family Perceptions, Expectations & Goals Pt/Family understanding of illness & functional limitations: Pt knows he is on Rehab and knows he is here because he had a stroke. Premorbid pt/family roles/activities: Pt enjoys jazz music and reading. Anticipated changes in roles/activities/participation: Pt hopes to resume these activities as he is able. Pt/family expectations/goals: Pt's goal is to  keep improving and to get better.  Community [employer] Agencies: None Premorbid Home Care/DME Agencies: Other (Comment) (had [facility] PTA) Transportation available at discharge: family Resource referrals recommended: Neuropsychology, Support group (specify) [facility] Support Group)  Discharge Planning Living Arrangements: Spouse/significant other Support Systems: Spouse/significant other, Children, Other relatives Type of Residence: Private residence Insurance Resources: [employer], [employer] (specify county) Sports coach) Museum/gallery curator Resources: [place] Referred: No Money Management: Spouse Does the patient have any problems obtaining your medications?: No Home Management: Pt's family can assist with this. Patient/Family Preliminary Plans: Per pt, wife will be with him to provide 24/7 supervision.  Son may come from [place] to help pt initially, as well. Barriers to Discharge: Steps Social Work Anticipated Follow Up Needs: HH/OP, Support Group Expected length of stay: 24 to 27 days  Clinical Impression CSW met with pt to introduce self and role of CSW, as well as to complete assessment.  Pt is glad to be on rehab and feels he has already made progress.  Pt relies on his family, himself, and his faith to help him through tough times.  Pt stated that his wife is in good health and will be able to provide him with supervision.  Pt's son may also come to help.  Pt does not have any current concerns, questions, needs at this time, but CSW will continue to follow and assist as needed.  Jaelen Gellerman, Silvestre Mesi 07/06/2016, 3:19 PM

## 2016-07-06 NOTE — Progress Notes (Signed)
Physical Therapy Session Note  Patient Details  Name: Nathan Frank MRN: [id number] Date of Birth: Feb 26, 1941  Today's Date: 07/06/2016 PT Individual Time: 9191-6606 PT Individual Time Calculation (min): 30 min    Short Term Goals: Week 1:  PT Short Term Goal 1 (Week 1): Pt will increase bed mobility to mod A.  PT Short Term Goal 2 (Week 1): Pt will increase transfers bed to chair, chair to bed to mod A.  PT Short Term Goal 3 (Week 1): Pt will ambulate about 25 feet with rolling walker and mod A.  PT Short Term Goal 4 (Week 1): Pt ascend/descend 2 stairs with B rails and mod A.  PT Short Term Goal 5 (Week 1): Pt will propel w/c about 50 feet with min A.   Skilled Therapeutic Interventions/Progress Updates:    Pt received resting in w/c with no c/o pain and agreeable to therapy session.  Pt perseverative throughout session on finding out where his clothes went once he was admitted to acute care.  NMR for initiation, scanning, and attention with 3 trials of dynavision from w/c level.  Mode A pt with 3 hits/20 sec reaction time > 6 hits/10 sec reaction time > 9 hits/6 sec reaction time.  W/C propulsion x5' with BUEs with pt demonstrating R gaze preference and decreased attention to obstacles in L visual field requiring max assist to propel in straight line.  Pt returned to room at end of session, wife provided with number to call emergency department in search of clothing.  Pt left upright in w/c with QRB in place, call bell in reach and needs met.   Therapy Documentation Precautions:  Precautions Precautions: Fall Restrictions Weight Bearing Restrictions: No   See Function Navigator for Current Functional Status.   Therapy/Group: Individual Therapy  Rosmary Dionisio E Penven-Crew 07/06/2016, 5:05 PM

## 2016-07-06 NOTE — Progress Notes (Signed)
Occupational Therapy Session Note  Patient Details  Name: Nathan Frank MRN: [id number] Date of Birth: 10/03/1941  Today's Date: 07/06/2016 OT Individual Time: 3010-4045 OT Individual Time Calculation (min): 41 min     Short Term Goals:Week 1:  OT Short Term Goal 1 (Week 1): Pt will complete toilet transfer with Max A and 1 helper with LRAD OT Short Term Goal 2 (Week 1): Pt will complete sit<stand with LB dressing and Mod A OT Short Term Goal 3 (Week 1): Pt will self sequence BADL steps with mod vcs OT Short Term Goal 4 (Week 1): Pt will engage in 10 minute thearpeutic activity with 1 rest break to improve activity tolerance   Skilled Therapeutic Interventions/Progress Updates:    Pt seen this session for LUE NMR and basic grooming tasks. Pt received in w/c with IV line attached. Pt set up at sink to brush teeth with A for setting up brush. Cues for thoroughness and cues to fully cleanse face. Needed total A with hands.  Pt does have active movement in LUE but has great difficulty utilizing it in function due to L neglect. Worked on attention to L hand with various AROM exercises. Pt c/o edema in all 4 extremities. Reviewed and practiced AROM in R arm/leg also for overall strength and edema reduction.  Pt would benefit from a thicker/higher w/c cushion and a lap tray.  Pt in room with quick release belt on and all needs met.  Therapy Documentation Precautions:  Precautions Precautions: Fall Restrictions Weight Bearing Restrictions: No    Vital Signs: Therapy Vitals Pulse Rate: 85 BP: (!) 130/97 Pain:   ADL: ADL ADL Comments: Please see functional navigator for ADL levels     See Function Navigator for Current Functional Status.   Therapy/Group: Individual Therapy  [place] 07/06/2016, 12:19 PM

## 2016-07-07 ENCOUNTER — Inpatient Hospital Stay (HOSPITAL_COMMUNITY): Payer: Self-pay | Admitting: Occupational Therapy

## 2016-07-07 ENCOUNTER — Inpatient Hospital Stay (HOSPITAL_COMMUNITY): Payer: Medicare Other | Admitting: Speech Pathology

## 2016-07-07 ENCOUNTER — Inpatient Hospital Stay (HOSPITAL_COMMUNITY): Payer: Medicare Other | Admitting: Physical Therapy

## 2016-07-07 LAB — BASIC METABOLIC PANEL
ANION GAP: 5 (ref 5–15)
BUN: 14 mg/dL (ref 6–20)
CHLORIDE: 119 mmol/L — AB (ref 101–111)
CO2: 18 mmol/L — AB (ref 22–32)
Calcium: 8.4 mg/dL — ABNORMAL LOW (ref 8.9–10.3)
Creatinine, Ser: 1.61 mg/dL — ABNORMAL HIGH (ref 0.61–1.24)
GFR calc non Af Amer: 40 mL/min — ABNORMAL LOW (ref >60)
GFR, EST AFRICAN AMERICAN: 47 mL/min — AB (ref >60)
GLUCOSE: 93 mg/dL (ref 65–99)
Potassium: 3.4 mmol/L — ABNORMAL LOW (ref 3.5–5.1)
Sodium: 142 mmol/L (ref 135–145)

## 2016-07-07 MED ORDER — POTASSIUM CHLORIDE 20 MEQ/15ML (10%) PO SOLN
20.0000 meq | Freq: Every day | ORAL | Status: DC
  Administered 2016-07-09 – 2016-07-10 (×2): 20 meq via ORAL
  Filled 2016-07-07 (×3): qty 15

## 2016-07-07 MED ORDER — SPIRONOLACTONE 25 MG PO TABS
12.5000 mg | ORAL_TABLET | Freq: Every day | ORAL | Status: DC
  Administered 2016-07-07 – 2016-07-30 (×21): 12.5 mg via ORAL
  Filled 2016-07-07 (×24): qty 1

## 2016-07-07 NOTE — Progress Notes (Signed)
Speech Language Pathology Daily Session Note  Patient Details  Name: Nathan Frank MRN: [id number] Date of Birth: [birth year]  Today's Date: 07/07/2016  Session 1 SLP Individual Time: 0930-1000 SLP Individual Time Calculation (min): 30 min Session 2 SLP Individual Time: 1530-1600 SLP Individual Time Calculation (min): 30 min  Short Term Goals: Week 1: SLP Short Term Goal 1 (Week 1): Patient will initiate functional tasks with no more than 2 repetition cues in 50% of given opportunities. SLP Short Term Goal 2 (Week 1): Patient will utilize compensatory strategies for naming retrieval at the sentence level given Mod A verbal and gesteral cues.  SLP Short Term Goal 3 (Week 1): Patient will demonstrate functional problem solving during basic tasks given Mod A verbal and visual cues.  SLP Short Term Goal 4 (Week 1): Patient will demonstrate intellectual awareness of 2 cognitive and 2 physical deficits given Mod A verbal cues.  SLP Short Term Goal 5 (Week 1): Patient will consume trials of regular textures and thin liquids via straw, demonstrating efficient mastication and oral clearance with no overt s/s of aspiration. SLP Short Term Goal 6 (Week 1): Patient will demonstrate 80% intelligibility at the sentence level given Min A verbal cues for overarticulation.   Skilled Therapeutic Interventions:   Session 1 Skilled treatment session focused on addressing dysphagia and cognition goals. SLP facilitated session by providing set-up and 2 repetitions of verbal cues to initiate self-feeding new items on tray in 3/3 opportunities. Once patient was able to initiate self-feeding he sustained attention throughout the completion of that item but then required Total assist to initiate the next item.  Patient consumed Dys.3 textures and thin liquids via straw with increased time for oral clearance and no overt s/s of aspiration.  Continue with current plan of care.  Session 2 Skilled treatment session  focused on addressing speech-language goals. SLP facilitated session with education regarding effective word finding strategies with emphasis on description.  SLP then facilitated session by providing black and white picture cards along with instructions to describe the person's occupation.  Patient verbally described job tasks in phrases with good intelligibility, ~ 80% in a quiet environment and required Mod question cues for accurate descriptions.  Continue with current plan of care.      Function:  Eating Eating   Modified Consistency Diet: Yes Eating Assist Level: Set up assist for;Supervision or verbal cues;More than reasonable amount of time   Eating Set Up Assist For: Opening containers;Cutting food       Cognition Comprehension Comprehension assist level: Understands basic 50 - 74% of the time/ requires cueing 25 - 49% of the time  Expression   Expression assist level: Expresses basic 50 - 74% of the time/requires cueing 25 - 49% of the time. Needs to repeat parts of sentences.  Social Interaction Social Interaction assist level: Interacts appropriately 75 - 89% of the time - Needs redirection for appropriate language or to initiate interaction.  Problem Solving Problem solving assist level: Solves basic 25 - 49% of the time - needs direction more than half the time to initiate, plan or complete simple activities  Memory Memory assist level: Recognizes or recalls 50 - 74% of the time/requires cueing 25 - 49% of the time    Pain Pain Assessment Pain Assessment: No/denies pain x2  Therapy/Group: Individual Therapy x2  Carmelia Roller., [place] [id number]  [place] 07/07/2016, 9:40 AM

## 2016-07-07 NOTE — Progress Notes (Signed)
Patient information reviewed and entered into eRehab system by Dillan Lunden, RN, CRRN, PPS Coordinator.  Information including medical coding and functional independence measure will be reviewed and updated through discharge.     Per nursing patient was given "Data Collection Information Summary for Patients in Inpatient Rehabilitation Facilities with attached "Privacy Act Statement-Health Care Records" upon admission.  

## 2016-07-07 NOTE — Evaluation (Signed)
Recreational Therapy Assessment and Plan  Patient Details  Name: Nathan Frank MRN: [id number] Date of Birth: 04/15/1941 Today's Date: 07/07/2016  Rehab Potential: Good ELOS: 3 weeks   Assessment Clinical Impression: Problem List:      Patient Active Problem List   Diagnosis Date Noted  . Left hemiparesis ([place]) 07/04/2016  . Acute ischemic right MCA stroke ([place]) 07/04/2016  . Obesity 07/01/2016  . Family hx-stroke 07/01/2016  . Hyperglycemia 07/01/2016  . Wound of left leg 07/01/2016  . Dysphagia, post-stroke   . Dysarthria, post-stroke   . Pyrexia   . Bleeding gastrointestinal   . Polysubstance abuse   . Acute on chronic combined systolic and diastolic heart failure ([place])   . Mitral valve regurgitation   . Tachypnea   . Anemia of chronic disease   . Leukocytosis   . Cerebral infarction due to embolism of right middle cerebral artery ([place]) - s/p mechanical thrombectomy 06/25/2016  . Hypomagnesemia 06/09/2016  . Acute on chronic diastolic CHF (congestive heart failure) ([place]) 06/09/2016  . Alcoholic cirrhosis of liver with ascites ([place])   . Other specified hypothyroidism   . Hepatocellular carcinoma ([place])   . Alcohol withdrawal ([place]) 05/27/2016  . Generalized weakness 05/27/2016  . Hypokalemia 05/27/2016  . Dehydration 05/27/2016  . Diarrhea 04/28/2016  . SIRS (systemic inflammatory response syndrome) ([place]) 04/19/2016  . Alcohol use disorder, moderate, in controlled environment, dependence ([place])   . Lactic acidosis   . Tachycardia   . Atrial fibrillation with RVR ([place]) 04/18/2016  . Chest pain 04/18/2016  . Hypotension 04/18/2016  . Elevated lactic acid level 04/18/2016  . Chronic atrial fibrillation ([place]) 04/18/2016  . SVT (supraventricular tachycardia) ([place])   . Prolonged Q-T interval on ECG   . Neuropathy ([place])   . Essential hypertension   . Thrombocytopenia ([place]) 04/16/2015  . Abnormal TSH 04/16/2015  . NSTEMI (non-ST elevated myocardial  infarction) ([place])   . Acute gastric ulcer   . Acute gastritis with hemorrhage   . GI bleed due to NSAIDs 12/13/2014  . Right knee pain 12/13/2014  . Dizziness and giddiness 12/13/2014  . Upper GI bleeding 12/13/2014  . Heroin overdose 02/20/2014  . S/P alcohol detoxification 02/02/2014  . Alcohol dependence ([place]) 02/02/2014  . Depressive disorder 02/01/2014  . Symptomatic cholelithiasis 12/15/2013  . Abdominal pain 12/14/2013  . Gastritis 06/07/2012  . DVT of lower limb, acute ([place]) 06/06/2012  . Oral thrush 06/05/2012  . Alcohol abuse 06/04/2012  . Weight loss 06/04/2012  . 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 Liver cirrhosis ([place]) 06/04/2012  . Hematuria 06/04/2012    Past Medical History:      Past Medical History:  Diagnosis Date  . Abnormal TSH   . Acute gastric ulcer   . Acute gastritis with hemorrhage   . Alcohol abuse   . Alcohol dependence ([place]) 02/02/2014  . Anemia   . CHF (congestive heart failure) ([place])   . Cirrhosis ([place]) 06/04/2012  . Depressive disorder 02/01/2014  . Dizziness and giddiness 12/13/2014  . DVT of lower limb, acute ([place]) 06/06/2012  . Essential hypertension   . Fatty liver   . Gallstones   . Gastritis 06/07/2012  . GERD (gastroesophageal reflux disease)   . GI bleed due to NSAIDs 12/13/2014  . Granulomatous gastritis   . Hematuria 06/04/2012  . Hepatitis C   . Hepatocellular carcinoma ([place])   . Heroin abuse    "I haven't done that since I don't know when."  .  Heroin overdose 02/20/2014  . LV dysfunction    a. 04/2015: EF 45-50% by cath.  . Neuropathy ([place])   . NSTEMI (non-ST elevated myocardial infarction) ([place])    a. 04/2015 - patent coronaries. Etiology possibly due to coronary spasm versus embolus, stress cardiomyopathy (atypical), and aborted infarction related to plaque rupture with thrombosis and dissolution. Amlodipine started. Not on antiplatelets due to GIB/cirrhosis history.  .  Oral thrush 06/05/2012  . Polysubstance abuse    Rare marijuana.  No EtOH x 2 months.   . Prolonged Q-T interval on ECG    a. 12/2014 - treated with magnesium.  . Right knee pain 12/13/2014  . S/P alcohol detoxification 02/02/2014  . SVT (supraventricular tachycardia) ([place])    a. 12/2014 in setting of GIB, ETOH, NSAIDS, gastritis.  . Symptomatic cholelithiasis 12/15/2013  . Thrombocytopenia ([place])   . Upper GI bleeding 12/13/2014  . Weight loss 06/04/2012   Past Surgical History:       Past Surgical History:  Procedure Laterality Date  . CARDIAC CATHETERIZATION N/A 04/15/2015   Procedure: Left Heart Cath and Coronary Angiography;  Surgeon: Belva Crome, MD;  Location: [facility];  Service: Cardiovascular;  Laterality: N/A;  . CIRCUMCISION    . ESOPHAGOGASTRODUODENOSCOPY  06/07/2012   Procedure: ESOPHAGOGASTRODUODENOSCOPY (EGD);  Surgeon: Milus Banister, MD;  Location: [facility];  Service: Endoscopy;  Laterality: N/A;  may need treatment of varices  . ESOPHAGOGASTRODUODENOSCOPY N/A 12/14/2014   Procedure: ESOPHAGOGASTRODUODENOSCOPY (EGD);  Surgeon: Jerene Bears, MD;  Location: [facility] ENDOSCOPY;  Service: Endoscopy;  Laterality: N/A;  . IR GENERIC HISTORICAL  06/25/2016   IR US GUIDE VASC ACCESS LEFT 06/25/2016 Corrie Mckusick, DO [facility]  . IR GENERIC HISTORICAL  06/25/2016   IR RADIOLOGY PERIPHERAL GUIDED IV START 06/25/2016 Corrie Mckusick, DO [facility]  . IR GENERIC HISTORICAL  06/25/2016   IR PERCUTANEOUS ART THROMBECTOMY/INFUSION INTRACRANIAL INC DIAG ANGIO 06/25/2016 Luanne Bras, MD [facility]  . RADIOLOGY WITH ANESTHESIA N/A 06/25/2016   Procedure: RADIOLOGY WITH ANESTHESIA;  Surgeon: Luanne Bras, MD;  Location: [facility];  Service: Radiology;  Laterality: N/A;  . TONSILLECTOMY      Assessment & Plan Clinical Impression: Nathan Mohler Tatumis a 75 y.o.RH-male with history of Afib, alcoholic cirrhosis with ascites/thrombocytopenia, GIB, polysubstance  abuse, Hep C, hepatocellular CA, A fib (no anticoagulation due to non-compliance), most recent admission for acute on chronic CHF. He was admitted on 06/25/16 after being found on the floor with left sided weakness, right gaze preference and facial droop with dysarthria. 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 He underwent cerebral angio with thrombectomy of R-MCA clot and IA Integrilin. 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 2D echo with EF 40-45% and mild MVR.  Pt presents with decreased activity tolerance, decreased functional mobility, decreased balance, decreased coordiantion, decreased initiation, decreased attention, decreased safety Limiting pt's independence with leisure/community pursuits.  Limiting pt's independence with leisure/community pursuits.  Leisure History/Participation Premorbid leisure interest/current participation: [employer] store;Community - Travel (Comment) Expression Interests: Music (Comment);Singing;Dance (jazz, reagge) Other Leisure Interests: Television;Reading Leisure Participation Style: Alone;With Family/Friends Awareness of Community Resources: Good-identify 3 post discharge leisure resources Psychosocial / Spiritual Social interaction - Mood/Behavior: Cooperative Academic librarian Appropriate for Education?: Yes Recreational Therapy Orientation Orientation -Reviewed with patient: Available activity resources Strengths/Weaknesses Patient Strengths/Abilities: Willingness to participate Patient weaknesses: Physical limitations TR Patient demonstrates impairments in the following area(s): Endurance;Motor;Safety;Skin Integrity  Plan Rec Therapy Plan Is patient appropriate for Therapeutic Recreation?: Yes Rehab Potential: Good Treatment times per week: Min 1 time per week >20 minutes Estimated Length of Stay: 3 weeks TR  Treatment/Interventions: Adaptive equipment instruction;1:1 session;Balance/vestibular training;Cognitive remediation/compensation;Community reintegration;Patient/family education;Functional mobility training;Recreation/leisure participation;Therapeutic activities;Therapeutic exercise;UE/LE Coordination activities;Wheelchair propulsion/positioning  Recommendations for other services: None  Discharge Criteria: Patient will be discharged from TR if patient refuses treatment 3 consecutive times without medical reason.  If treatment goals not met, if there is a change in medical status, if patient makes no progress towards goals or if patient is discharged from hospital.  The above assessment, treatment plan, treatment alternatives and goals were discussed and mutually agreed upon: by patient  [place] 07/07/2016, 3:35 PM

## 2016-07-07 NOTE — IPOC Note (Signed)
Overall Plan of Care [facility]) Patient Details Name: Nathan Frank MRN: [id number] DOB: 08/30/1941  Admitting Diagnosis: R CVA  Hospital Problems: Principal Problem:   Cerebral infarction due to embolism of right middle cerebral artery ([place]) - s/p mechanical thrombectomy Active Problems:   Essential hypertension   Atrial fibrillation with RVR (HCC)   Alcoholic cirrhosis of liver with ascites (H[facility])   Acute on chronic diastolic CHF (congestive heart failure) (H[facility])   Dysarthria, post-stroke   Anemia of chronic disease   Left hemiparesis (H[facility])   Acute ischemic right MCA stroke ([place])     Functional Problem List: Nursing Bladder, Bowel, Edema, Endurance, Nutrition  PT Balance, Endurance, Motor, Safety, Perception  OT Balance, Cognition, Vision, Endurance, Motor, Perception, Safety  SLP Nutrition, Cognition, Safety, Perception, Linguistic  TR         Basic ADL's: OT Bathing, Dressing, Toileting, Grooming     Advanced  ADL's: OT Simple Meal Preparation     Transfers: PT Bed Mobility, Bed to Chair, Teacher, early years/pre, Tub/Shower     Locomotion: PT Ambulation, Emergency planning/management officer, Stairs     Additional Impairments: OT Fuctional Use of Upper Extremity  SLP Communication, Social Cognition, Swallowing expression Social Interaction, Problem Solving, Memory, Attention, Awareness  TR      Anticipated Outcomes Item Anticipated Outcome  Self Feeding N/A  Swallowing  least restrictive PO diet Mod I after set-up    Basic self-care  Supervision   Toileting  Supervision    Bathroom Transfers Supervision   Bowel/Bladder  continence  Transfers  S transfers  Locomotion  mod I w/c mobility, S ambulation, S stairs  Communication  expression of basic wants and needs Supervision   Cognition  basic safety and problem solving Supervision   Pain  pain at 2  Safety/Judgment  call for help and transfer with assistance   Therapy Plan: PT Intensity: Minimum of 1-2 x/day ,45 to 90  minutes PT Frequency: 5 out of 7 days PT Duration Estimated Length of Stay: 24 to 26 days OT Intensity: Minimum of 1-2 x/day, 45 to 90 minutes OT Frequency: 5 out of 7 days OT Duration/Estimated Length of Stay: 25-27 days SLP Intensity: Minumum of 1-2 x/day, 30 to 90 minutes SLP Frequency: 3 to 5 out of 7 days SLP Duration/Estimated Length of Stay: 20-24 days       Team Interventions: Nursing Interventions Bladder Management, Pain Management, Patient/Family Education, Skin Care/Wound Management, Bowel Management, Medication Management, Dysphagia/Aspiration Precaution Training, Discharge Planning, Disease Management/Prevention, Psychosocial Support  PT interventions Ambulation/gait training, Balance/vestibular training, Discharge planning, DME/adaptive equipment instruction, Functional electrical stimulation, Functional mobility training, Neuromuscular re-education, Patient/family education, Psychosocial support, Stair training, Splinting/orthotics, Therapeutic Activities, Therapeutic Exercise, UE/LE Strength taining/ROM, UE/LE Coordination activities, Visual/perceptual remediation/compensation, Wheelchair propulsion/positioning  OT Interventions Discharge planning, Self Care/advanced ADL retraining, Therapeutic Activities, Pain management, UE/LE Coordination activities, Functional mobility training, Cognitive remediation/compensation, Patient/family education, Therapeutic Exercise, DME/adaptive equipment instruction, Neuromuscular re-education, Psychosocial support, UE/LE Strength taining/ROM, Wheelchair propulsion/positioning  SLP Interventions Cognitive remediation/compensation, Cueing hierarchy, Dysphagia/aspiration precaution training, Environmental controls, Functional tasks, Internal/external aids, Medication managment, Patient/family education, Speech/Language facilitation, Therapeutic Activities  TR Interventions    SW/CM Interventions Discharge Planning, Psychosocial Support, Patient/Family  Education    Team Discharge Planning: Destination: PT-Home ,OT- Home , SLP-Home Projected Follow-up: PT-Home health PT, OT-  Home health OT, Outpatient OT, SLP-24 hour supervision/assistance, Home Health SLP, Outpatient SLP Projected Equipment Needs: PT-To be determined, OT- To be determined, SLP-None recommended by SLP Equipment Details: PT- , OT-  Patient/family involved in discharge planning: PT- Patient,  OT-Patient, SLP-Patient  MD ELOS: 22-24 days Medical Rehab Prognosis:  Good Assessment: The patient has been admitted for CIR therapies with the diagnosis of right MCA infarct with left hemiparesis. The team will be addressing functional mobility, strength, stamina, balance, safety, adaptive techniques and equipment, self-care, bowel and bladder mgt, patient and caregiver education, NMR, visual-spatial awareness, cognition, pain mgt, w/c mobility. Goals have been set at supervision with mobility, self-care and cognition.    Meredith Staggers, MD, FAAPMR      See Team Conference Notes for weekly updates to the plan of care

## 2016-07-07 NOTE — Progress Notes (Signed)
Occupational Therapy Session Note  Patient Details  Name: Nathan Frank MRN: [id number] Date of Birth: Oct 01, 1941  Today's Date: 07/07/2016 OT Individual Time: [id number] OT Individual Time Calculation (min): 60 min     Short Term Goals: Week 1:  OT Short Term Goal 1 (Week 1): Pt will complete toilet transfer with Max A and 1 helper with LRAD OT Short Term Goal 2 (Week 1): Pt will complete sit<stand with LB dressing and Mod A OT Short Term Goal 3 (Week 1): Pt will self sequence BADL steps with mod vcs OT Short Term Goal 4 (Week 1): Pt will engage in 10 minute thearpeutic activity with 1 rest break to improve activity tolerance   Skilled Therapeutic Interventions/Progress Updates:   1:1 self care retraining at sink level (pt's choice) with focus on transfer training and sit to stands, sustained attention, erect standing posture with visual feedback, following directions. Pt able to perform stand pivot transfer from elevated bed height with min A into w/c. Pt able to perform stand pivot from w/c with mod A with steadying A for controlled decent back into chair.   No clothes for dressing today.  Therapy Documentation Precautions:  Precautions Precautions: Fall Restrictions Weight Bearing Restrictions: No Pain:  no c/o pain ; just c/o swollen arms/legs  See Function Navigator for Current Functional Status.   Therapy/Group: Individual Therapy  [name] 07/07/2016, 2:27 PM

## 2016-07-07 NOTE — Progress Notes (Signed)
Physical Therapy Session Note  Patient Details  Name: Nathan Frank MRN: [id number] Date of Birth: 15-Jan-1941  Today's Date: 07/07/2016 PT Individual Time: 1345-1500 PT Individual Time Calculation (min): 75 min    Short Term Goals:Week 1:  PT Short Term Goal 1 (Week 1): Pt will increase bed mobility to mod A.  PT Short Term Goal 2 (Week 1): Pt will increase transfers bed to chair, chair to bed to mod A.  PT Short Term Goal 3 (Week 1): Pt will ambulate about 25 feet with rolling walker and mod A.  PT Short Term Goal 4 (Week 1): Pt ascend/descend 2 stairs with B rails and mod A.  PT Short Term Goal 5 (Week 1): Pt will propel w/c about 50 feet with min A.   Skilled Therapeutic Interventions/Progress Updates:    Pt received resting in w/c, no c/o pain, states, "I'm not having much luck with this urinal and I need to go #2."  Pt had (+) void in brief (-)  BM.  Sit<>stand in stedy with max assist and pt transported to bathroom in stedy.  Prolonged sitting Balance on  BSC for (-) BM while engaged in therapeutic conversation regarding PLOF and d/c.  RN in for suppository, dynamic sitting balance and sit<>squat with mod assist for administration.  Sit<>stand into stedy with min assist to don clean brief when pt states he's ready to go, doffed brief total assist and pt returned to sitting on BSC for small BM.  Sit<>stand in stedy with supervision and static standing focus on weight shift to R to don clean brief.  Pt returned to w/c and reports that he needs to return to restroom but doesn't want to use stedy, mod/max assist for squat/pivot w/c<>BSC over toilet.  Total assist for clothing management and hygiene.  Pt returned to w/c at end of session and reports he still feels as though he needs to urinate, despite having (+) void x2 during toileting.  Unsuccessful with urinal, RN aware.  Pt left upright in w/c with QRB in place, call bell in reach and needs met.   Therapy Documentation Precautions:   Precautions Precautions: Fall Restrictions Weight Bearing Restrictions: No   See Function Navigator for Current Functional Status.   Therapy/Group: Individual Therapy  Earnest Conroy [place] 07/07/2016, 2:17 PM

## 2016-07-07 NOTE — Progress Notes (Signed)
[place] PHYSICAL MEDICINE & REHABILITATION     PROGRESS NOTE    Subjective/Complaints: Patient states that he feels his arms and legs are more swollen. He has been on IV fluids at night for the last 3 nights. He denies any shortness of breath. He has been able to tolerate his therapy program.  ROS: Pt denies fever, rash/itching, headache, blurred or double vision, nausea, vomiting, abdominal pain, diarrhea, chest pain, shortness of breath, palpitations, dysuria, dizziness, neck or back pain, bleeding, anxiety, or depression   Objective: Vital Signs: Blood pressure 106/71, pulse 95, temperature 97.5 F (36.4 C), temperature source Oral, resp. rate 18, height 6' (1.829 m), weight 103.8 kg (228 lb 13.4 oz), SpO2 97 %. No results found.  Recent Labs  07/05/16 1243  WBC 5.7  HGB 10.0*  HCT 30.8*  PLT 230    Recent Labs  07/05/16 1243  NA 138  K 3.0*  CL 117*  GLUCOSE 111*  BUN 19  CREATININE 1.67*  CALCIUM 8.4*   CBG (last 3)  No results for input(s): GLUCAP in the last 72 hours.  Wt Readings from Last 3 Encounters:  07/04/16 103.8 kg (228 lb 13.4 oz)  06/30/16 103.8 kg (228 lb 14.4 oz)  06/16/16 92.6 kg (204 lb 3.2 oz)    Physical Exam:  Constitutional: He is oriented to person, place, and time. He appears well-developed and well-nourished.  HENT:  Head: Normocephalic and atraumatic.  Eyes: Conjunctivae are normal. Pupils are equal, round, and reactive to light.  Neck: Normal range of motion. Neck supple.  Cardiovascular: Normal rate.  An irregular rhythm present. Exam reveals no gallop and no friction rub.   No murmur heard. Respiratory: Effort normal. No stridor. No respiratory distress. He has no wheezes. He has rhonchi. He has no rales.  GI: Soft. Bowel sounds are increased. There is no tenderness. There is no rebound and no guarding.  Mild distention  Ext: 1+ edema RUE Neurological: He is alert and oriented to person, place, and time.  Left facial  weakness with minimal dysarthria and occasional wet voice. Flat affect with delayed processing and slow movements.Fair insight and awareness. He is able to follow simple one and two step motor commands.  Left sided weakness: LUW 2/5 deltoid, bicep, tricep, wrist, hand, 1+ to 2-/5 HF, KE, ADF/PF. Senses pain Left arm and leg.   Skin: Skin is warm and dry. No rash noted.  BLE with dry flaky skin.  Psychiatric: His affect is blunt. His speech is delayed. He is slowed.   Assessment/Plan: 1. 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 Physiatrist is providing close team supervision and 24 hour management of active medical problems listed below. Physiatrist and rehab team continue to assess barriers to discharge/monitor patient progress toward functional and medical goals.  Function:  Bathing Bathing position   Position: Wheelchair/chair at sink  Bathing parts Body parts bathed by patient: Left arm, Chest, Abdomen, Front perineal area, Right upper leg, Left upper leg Body parts bathed by helper: Buttocks, Right arm, Right lower leg, Left lower leg, Back  Bathing assist Assist Level: Touching or steadying assistance(Pt > 75%)      Upper Body Dressing/Undressing Upper body dressing   What is the patient wearing?: Hospital gown                Upper body assist Assist Level: Touching or steadying assistance(Pt >  75%)      Lower Body Dressing/Undressing Lower body dressing   What is the patient wearing?: Underwear, Non-skid slipper socks   Underwear - Performed by helper: Thread/unthread right underwear leg, Thread/unthread left underwear leg, Pull underwear up/down       Non-skid slipper socks- Performed by helper: Don/doff right sock, Don/doff left sock                  Lower body assist Assist for lower body dressing: Touching or steadying  assistance (Pt > 75%)      Toileting Toileting          Toileting assist     Transfers Chair/bed transfer   Chair/bed transfer method: Stand pivot Chair/bed transfer assist level: Maximal assist (Pt 25 - 49%/lift and lower) Chair/bed transfer assistive device: Armrests     Locomotion Ambulation     Max distance: attempted Assist level: Total assist (Pt < 25%)   Wheelchair   Type: Manual Max wheelchair distance: 5 Assist Level: Maximal assistance (Pt 25 - 49%)  Cognition Comprehension Comprehension assist level: Understands basic 50 - 74% of the time/ requires cueing 25 - 49% of the time  Expression Expression assist level: Expresses basic 50 - 74% of the time/requires cueing 25 - 49% of the time. Needs to repeat parts of sentences.  Social Interaction Social Interaction assist level: Interacts appropriately 75 - 89% of the time - Needs redirection for appropriate language or to initiate interaction.  Problem Solving Problem solving assist level: Solves basic 25 - 49% of the time - needs direction more than half the time to initiate, plan or complete simple activities  Memory Memory assist level: Recognizes or recalls 50 - 74% of the time/requires cueing 25 - 49% of the time   Medical Problem List and Plan: 1.  Left hemiparesis and funtctional deficits secondary to right MCA embolic infarct  -Continue CIR PT, OT, Speech therapy, team conference in a.m. 2.  DVT Prophylaxis/Anticoagulation:   Lovenox 3. Pain Management: tylenol prn. Support limbs as needed to maintain joint position and prevent contracture 4. Mood: team to follow for ego support. SW to assess upon admit.  5. Neuropsych: This patient is not yet capable of making decisions on his own behalf. 6. Skin/Wound Care: encourage nutrition and pressure relief as needed 7. Fluids/Electrolytes/Nutrition: encourage PO. Check electrolytes upon admit 8. Afib: ASA daily             -metoprolol increased to 50mg  BID for better  rate control Vitals:   07/06/16 2054 07/07/16 0543  BP: 132/87 106/71  Pulse:  95  Resp: 18 18  Temp:  97.5 F (36.4 C)               -no evidence of bradycardia             -adjust regimen as needed 9. AKI: responding to IV hydration, however, patient is developing increased peripheral edema likely related to his low serum albumin. Will stop nighttime IV fluids             -continue to push pos  -.  10. Hypokalemia: continue to replete potassium             -Repeat BMET today, has been on spironolactone and potassium and Lasix at home. Will restart low-dose spironolactone. If repeat basic metabolic package shows worsening of renal function may need to involve nephrology 11. Anemia:  Hgb 14.1 at admission down to --->8.5. Back to 9.5 on 9/30             -  labs indicate anemia of chronic disease             -stool hemoccult negative             -encourage appropriate nutrition  12. Cirrhosis of Liver.             -monitor LFT's and ammonia as needed                    -no evidence of encephalopathy. At the current time 39. HTN--metoprolol LOS (Days) 3 A FACE TO FACE EVALUATION WAS PERFORMED  Cristabel Bicknell E 07/07/2016 10:07 AM

## 2016-07-08 ENCOUNTER — Inpatient Hospital Stay (HOSPITAL_COMMUNITY): Payer: Self-pay | Admitting: Occupational Therapy

## 2016-07-08 ENCOUNTER — Inpatient Hospital Stay (HOSPITAL_COMMUNITY): Payer: Medicare Other | Admitting: Speech Pathology

## 2016-07-08 ENCOUNTER — Inpatient Hospital Stay (HOSPITAL_COMMUNITY): Payer: Medicare Other

## 2016-07-08 ENCOUNTER — Inpatient Hospital Stay (HOSPITAL_COMMUNITY): Payer: Self-pay | Admitting: Physical Therapy

## 2016-07-08 MED ORDER — FUROSEMIDE 20 MG PO TABS
20.0000 mg | ORAL_TABLET | Freq: Every day | ORAL | Status: DC
  Administered 2016-07-08 – 2016-07-09 (×2): 20 mg via ORAL
  Filled 2016-07-08 (×2): qty 1

## 2016-07-08 MED ORDER — AMPICILLIN-SULBACTAM SODIUM 3 (2-1) G IJ SOLR
3.0000 g | Freq: Three times a day (TID) | INTRAMUSCULAR | Status: DC
  Administered 2016-07-08 – 2016-07-09 (×3): 3 g via INTRAVENOUS
  Filled 2016-07-08 (×5): qty 3

## 2016-07-08 MED ORDER — AMOXICILLIN-POT CLAVULANATE 875-125 MG PO TABS: 1.0000 | ORAL_TABLET | Freq: Two times a day (BID) | ORAL | Status: DC

## 2016-07-08 NOTE — Progress Notes (Signed)
[place] PHYSICAL MEDICINE & REHABILITATION     PROGRESS NOTE    Subjective/Complaints: No c/os today , appears more drowsy this am, no apparent SOB  ROS: Pt denies fever, rash/itching, headache, blurred or double vision, nausea, vomiting, abdominal pain, diarrhea, chest pain, shortness of breath, palpitations, dysuria, dizziness, neck or back pain, bleeding, anxiety, or depression   Objective: Vital Signs: Blood pressure 130/82, pulse 78, temperature 98 F (36.7 C), temperature source Oral, resp. rate 18, height 6' (1.829 m), weight 103.8 kg (228 lb 13.4 oz), SpO2 98 %. No results found.  Recent Labs  07/05/16 1243  WBC 5.7  HGB 10.0*  HCT 30.8*  PLT 230    Recent Labs  07/05/16 1243 07/07/16 1035  NA 138 142  K 3.0* 3.4*  CL 117* 119*  GLUCOSE 111* 93  BUN 19 14  CREATININE 1.67* 1.61*  CALCIUM 8.4* 8.4*   CBG (last 3)  No results for input(s): GLUCAP in the last 72 hours.  Wt Readings from Last 3 Encounters:  07/04/16 103.8 kg (228 lb 13.4 oz)  06/30/16 103.8 kg (228 lb 14.4 oz)  06/16/16 92.6 kg (204 lb 3.2 oz)    Physical Exam:  Constitutional: He is oriented to person, place, and time. He appears well-developed and well-nourished.  HENT:  Head: Normocephalic and atraumatic.  Eyes: Conjunctivae are normal. Pupils are equal, round, and reactive to light.  Neck: Normal range of motion. Neck supple.  Cardiovascular: Normal rate.  An irregular rhythm present. Exam reveals no gallop and no friction rub.   No murmur heard. Respiratory: Effort normal. No stridor. No respiratory distress. He has no wheezes. He has rhonchi. He has no rales.  GI: Soft. Bowel sounds are increased. There is no tenderness. There is no rebound and no guarding.  Mild distention  Ext: 2+ edema BLE Neurological: He is alert and oriented to person, place, and time.  Left facial weakness with minimal dysarthria and occasional wet voice. Flat affect with delayed processing and slow  movements.Fair insight and awareness. He is able to follow simple one and two step motor commands.  Left sided weakness: LUW 2/5 deltoid, bicep, tricep, wrist, hand, 1+ to 2-/5 HF, KE, ADF/PF. Senses pain Left arm and leg.   Skin: Skin is warm and dry. No rash noted.  BLE with dry flaky skin.  Psychiatric: His affect is blunt. His speech is delayed. He is slowed.   Assessment/Plan: 1. 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 Physiatrist is providing close team supervision and 24 hour management of active medical problems listed below. Physiatrist and rehab team continue to assess barriers to discharge/monitor patient progress toward functional and medical goals.  Function:  Bathing Bathing position   Position: Wheelchair/chair at sink  Bathing parts Body parts bathed by patient: Left arm, Chest, Abdomen, Front perineal area, Right upper leg, Left upper leg Body parts bathed by helper: Buttocks, Right arm, Right lower leg, Left lower leg, Back  Bathing assist Assist Level: Touching or steadying assistance(Pt > 75%) (60%, moderate assist)      Upper Body Dressing/Undressing Upper body dressing   What is the patient wearing?: Hospital gown                Upper body assist Assist Level: Touching or steadying assistance(Pt > 75%)      Lower Body Dressing/Undressing Lower body dressing   What  is the patient wearing?: Underwear, Non-skid slipper socks   Underwear - Performed by helper: Thread/unthread right underwear leg, Thread/unthread left underwear leg, Pull underwear up/down       Non-skid slipper socks- Performed by helper: Don/doff right sock, Don/doff left sock                  Lower body assist Assist for lower body dressing:  (helper did all tasks, total assist)      Toileting Toileting Toileting activity did not occur:  No continent bowel/bladder event   Toileting steps completed by helper: Adjust clothing prior to toileting, Performs perineal hygiene, Adjust clothing after toileting Toileting Assistive Devices: Grab bar or rail  Toileting assist Assist level:  (total assist)   Transfers Chair/bed transfer   Chair/bed transfer method: Stand pivot Chair/bed transfer assist level: dependent (Pt equals 0%) Chair/bed transfer assistive device: Mechanical lift Mechanical lift: Stedy   Locomotion Ambulation     Max distance: attempted Assist level: Total assist (Pt < 25%)   Wheelchair   Type: Manual Max wheelchair distance: 5 Assist Level: Maximal assistance (Pt 25 - 49%)  Cognition Comprehension Comprehension assist level: Understands basic 50 - 74% of the time/ requires cueing 25 - 49% of the time  Expression Expression assist level: Expresses basic 50 - 74% of the time/requires cueing 25 - 49% of the time. Needs to repeat parts of sentences.  Social Interaction Social Interaction assist level: Interacts appropriately 75 - 89% of the time - Needs redirection for appropriate language or to initiate interaction.  Problem Solving Problem solving assist level: Solves basic 25 - 49% of the time - needs direction more than half the time to initiate, plan or complete simple activities  Memory Memory assist level: Recognizes or recalls 50 - 74% of the time/requires cueing 25 - 49% of the time   Medical Problem List and Plan: 1.  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 Formulized individual treatment plan based on medical history, underlying problem and comorbidities. 2.  DVT Prophylaxis/Anticoagulation:   Lovenox 3. Pain Management: tylenol prn. Support limbs as needed to maintain  joint position and prevent contracture 4. Mood: team to follow for ego support. SW to assess upon admit.  5. Neuropsych: This patient is not yet capable of making decisions on his own behalf. 6. Skin/Wound Care: encourage nutrition and pressure relief as needed 7. Fluids/Electrolytes/Nutrition: encourage PO. IV fluids d/ced due to peripheral edema 8. Afib: ASA daily             -metoprolol increased to 84m BID for better rate control Vitals:   07/08/16 0455 07/08/16 0757  BP: 118/73 130/82  Pulse: (!) 101 78  Resp: 18   Temp: 98 F (36.7 C)                -no evidence of bradycardia             -adjust regimen as needed 9. AKI: responding to IV hydration, however, patient is developing increased peripheral edema likely related to his low serum albumin. Will stop nighttime IV fluids             -continue to push pos  -.  10. Hypokalemia: continue to replete potassium             -Repeat BMET  stable, looks like creat is at basline, his BUN may need to run closer to 30 to achieve improvements in peripheral edema, has been on spironolactone and potassium and Lasix at home. Will restart low-dose spironolactone.Lasix to restart today 11. Anemia:  Hgb 14.1 at admission down to --->8.5. 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             -monitor LFT's and ammonia as needed                    -no evidence of encephalopathy. At the current time 49. HTN--metoprolol 14.  CHF ef 40-45% monitor for pulm edema, check CXR LOS (Days) 4 A FACE TO FACE EVALUATION WAS PERFORMED  Alysia Penna E 07/08/2016 11:38 AM

## 2016-07-08 NOTE — Progress Notes (Signed)
Pharmacy Antibiotic Note  Nathan Frank is a 75 y.o. male admitted on 07/04/2016 with aspiration PNA.  Pharmacy has been consulted for Unasyn dosing.  Plan: Unasyn 3g IV q8h. Follow c/s, clinical progression, renal function, LOT  Height: 6' (182.9 cm) Weight: 214 lb 8 oz (97.3 kg) IBW/kg (Calculated) : 77.6  Temp (24hrs), Avg:98 F (36.7 C), Min:98 F (36.7 C), Max:98 F (36.7 C)   Recent Labs Lab 07/02/16 0707 07/03/16 0416 07/04/16 0649 07/05/16 1243 07/07/16 1035  WBC 7.9 8.2 6.9 5.7  --   CREATININE 1.78* 1.72* 1.68* 1.67* 1.61*    Estimated Creatinine Clearance: 47.9 mL/min (by C-G formula based on SCr of 1.61 mg/dL (H)).    No Known Allergies  Antimicrobials this admission: Unasyn 10/4 >>   Dose adjustments this admission: n/a  Microbiology results: 10/4 Sputum: ordered   Thank you for allowing pharmacy to be a part of this patient's care.  Keltin Baird D. Deira Shimer, PharmD, BCPS Clinical Pharmacist Pager: [phone] 07/08/2016 4:39 PM

## 2016-07-08 NOTE — Progress Notes (Signed)
Physical Therapy Session Note  Patient Details  Name: Nathan Frank MRN: [id number] Date of Birth: 1941-07-15  Today's Date: 07/08/2016 PT Individual Time: 1300-1400 PT Individual Time Calculation (min): 60 min    Short Term Goals: Week 1:  PT Short Term Goal 1 (Week 1): Pt will increase bed mobility to mod A.  PT Short Term Goal 2 (Week 1): Pt will increase transfers bed to chair, chair to bed to mod A.  PT Short Term Goal 3 (Week 1): Pt will ambulate about 25 feet with rolling walker and mod A.  PT Short Term Goal 4 (Week 1): Pt ascend/descend 2 stairs with B rails and mod A.  PT Short Term Goal 5 (Week 1): Pt will propel w/c about 50 feet with min A.   Skilled Therapeutic Interventions/Progress Updates:    Pt resting in w/c on arrival, no c/o pain, states "I'm ready to walk with the walker!"  W/C propulsion to therapy gym focus on reciprocal stepping with BLEs and attention to L side.  Gait training 2x50' with seated rest in between with min/mod assist for midline orientation and walker positioning with verbal cues for L step length.  Stair negotiation x8 steps with 2 rails and min assist with mod multimodal cues for sequencing LE advancement and attention to LUE on rail.  Pt requiring anywhere from supervision>mod assist for transfers 2/2 impairments in motor planning.  Pt returned to room at end of session and positioned upright in w/c with QRB in place, call bell in reach and needs met.    Therapy Documentation Precautions:  Precautions Precautions: Fall Restrictions Weight Bearing Restrictions: No   See Function Navigator for Current Functional Status.   Therapy/Group: Individual Therapy  Kaylaann Mountz E Penven-Crew 07/08/2016, 2:30 PM

## 2016-07-08 NOTE — Progress Notes (Signed)
Speech Language Pathology Daily Session Note  Patient Details  Name: Nathan Frank MRN: [id number] Date of Birth: December 26, 1940  Today's Date: 07/08/2016 SLP Individual Time: 0800-0900 SLP Individual Time Calculation (min): 60 min   Short Term Goals: Week 1: SLP Short Term Goal 1 (Week 1): Patient will initiate functional tasks with no more than 2 repetition cues in 50% of given opportunities. SLP Short Term Goal 2 (Week 1): Patient will utilize compensatory strategies for naming retrieval at the sentence level given Mod A verbal and gesteral cues.  SLP Short Term Goal 3 (Week 1): Patient will demonstrate functional problem solving during basic tasks given Mod A verbal and visual cues.  SLP Short Term Goal 4 (Week 1): Patient will demonstrate intellectual awareness of 2 cognitive and 2 physical deficits given Mod A verbal cues.  SLP Short Term Goal 5 (Week 1): Patient will consume trials of regular textures and thin liquids via straw, demonstrating efficient mastication and oral clearance with no overt s/s of aspiration. SLP Short Term Goal 6 (Week 1): Patient will demonstrate 80% intelligibility at the sentence level given Min A verbal cues for overarticulation.   Skilled Therapeutic Interventions:Pt seen for skilled SLP treatment with focus on compensatory strategies with PO intake and cognitive goals. Pt required min A to initiate and problem solve for basic functional task of self-feeding and PO management. Pt required mod A to identify changes from stroke and compensations now required to function safely. Pt able to recall details from schedule after initial review. Mod A for functional reading.   Function:  Eating Eating   Modified Consistency Diet: Yes Eating Assist Level: Set up assist for;Supervision or verbal cues;Help with picking up utensils;More than reasonable amount of time   Eating Set Up Assist For: Opening containers;Cutting food       Cognition Comprehension  Comprehension assist level: Understands basic 50 - 74% of the time/ requires cueing 25 - 49% of the time  Expression   Expression assist level: Expresses basic 50 - 74% of the time/requires cueing 25 - 49% of the time. Needs to repeat parts of sentences.  Social Interaction Social Interaction assist level: Interacts appropriately 75 - 89% of the time - Needs redirection for appropriate language or to initiate interaction.  Problem Solving Problem solving assist level: Solves basic 25 - 49% of the time - needs direction more than half the time to initiate, plan or complete simple activities  Memory Memory assist level: Recognizes or recalls 50 - 74% of the time/requires cueing 25 - 49% of the time    Pain Pain Assessment Pain Assessment: No/denies pain  Therapy/Group: Individual Therapy  Vinetta Bergamo MA, CCC-SLP 07/08/2016, 9:00 AM

## 2016-07-08 NOTE — Progress Notes (Signed)
Occupational Therapy Session Note  Patient Details  Name: Nathan Frank MRN: [id number] Date of Birth: December 21, 1940  Today's Date: 07/08/2016 OT Individual Time: [id number] Individual Time Calculation (min): 45 min     Short Term Goals: Week 1:  OT Short Term Goal 1 (Week 1): Pt will complete toilet transfer with Max A and 1 helper with LRAD OT Short Term Goal 2 (Week 1): Pt will complete sit<stand with LB dressing and Mod A OT Short Term Goal 3 (Week 1): Pt will self sequence BADL steps with mod vcs OT Short Term Goal 4 (Week 1): Pt will engage in 10 minute thearpeutic activity with 1 rest break to improve activity tolerance   Skilled Therapeutic Interventions/Progress Updates:   1:! Self care retraining at shower level with focus on stand pivot transfers with min to mod A bed<w/c<shower stall (BSC)<w/c. Min cuing for sustained attention to functional self care tasks and more than reasonable time. Pt required more than reasonable time to complete all dressing tasks with many rest breaks. Pt continues to demonstrate difficulty with motor planning requiring multimodal cues for initiation for motorical tasks. Pt able to perform sit to stands with min to mod A with verbal and tactile cues; benefited from counting 1,2 3 stand to help initiate standing for clothing management/ transfers. Pt with difficulty with coordination of left hand requiring A to fully gras/ hold on to objects.   2nd session 1:1 14:30-15:00  Focus on stand short distance functional ambulation with RW with min A with more than reasonable amt of time and tactile cues for intiiatoin. Pt able to perform bed mobility on flat mat with min A with extra time. Pt continues to have 2+ pitting edema in all 4 extremities. Pt able to ambulate from gym out into hallway with double doors with min A with cues for RW safety and balance. REturn to room and left in w/c.  Therapy Documentation Precautions:  Precautions Precautions:  Fall Restrictions Weight Bearing Restrictions: No Pain:  no c/o pain  See Function Navigator for Current Functional Status.   Therapy/Group: Individual Therapy  Willeen Cass [facility] 07/08/2016, 4:06 PM

## 2016-07-09 ENCOUNTER — Inpatient Hospital Stay (HOSPITAL_COMMUNITY): Payer: Medicare Other | Admitting: Speech Pathology

## 2016-07-09 ENCOUNTER — Inpatient Hospital Stay (HOSPITAL_COMMUNITY): Payer: Medicare Other | Admitting: Occupational Therapy

## 2016-07-09 ENCOUNTER — Inpatient Hospital Stay (HOSPITAL_COMMUNITY): Payer: Medicare Other

## 2016-07-09 ENCOUNTER — Inpatient Hospital Stay (HOSPITAL_COMMUNITY): Payer: Self-pay | Admitting: Physical Therapy

## 2016-07-09 LAB — EXPECTORATED SPUTUM ASSESSMENT W GRAM STAIN, RFLX TO RESP C

## 2016-07-09 LAB — CBC WITH DIFFERENTIAL/PLATELET
BASOS ABS: 0 10*3/uL (ref 0.0–0.1)
BASOS PCT: 1 %
Eosinophils Absolute: 0.1 10*3/uL (ref 0.0–0.7)
Eosinophils Relative: 2 %
HEMATOCRIT: 29.2 % — AB (ref 39.0–52.0)
HEMOGLOBIN: 9.8 g/dL — AB (ref 13.0–17.0)
Lymphocytes Relative: 20 %
Lymphs Abs: 0.8 10*3/uL (ref 0.7–4.0)
MCH: 31.9 pg (ref 26.0–34.0)
MCHC: 33.6 g/dL (ref 30.0–36.0)
MCV: 95.1 fL (ref 78.0–100.0)
Monocytes Absolute: 0.5 10*3/uL (ref 0.1–1.0)
Monocytes Relative: 12 %
NEUTROS PCT: 65 %
Neutro Abs: 2.7 10*3/uL (ref 1.7–7.7)
Platelets: 247 10*3/uL (ref 150–400)
RBC: 3.07 MIL/uL — ABNORMAL LOW (ref 4.22–5.81)
RDW: 17.1 % — AB (ref 11.5–15.5)
WBC: 4.2 10*3/uL (ref 4.0–10.5)

## 2016-07-09 LAB — EXPECTORATED SPUTUM ASSESSMENT W REFEX TO RESP CULTURE

## 2016-07-09 MED ORDER — FUROSEMIDE 40 MG PO TABS
40.0000 mg | ORAL_TABLET | Freq: Every day | ORAL | Status: DC
  Administered 2016-07-10: 40 mg via ORAL
  Filled 2016-07-09: qty 1

## 2016-07-09 MED ORDER — SODIUM CHLORIDE 0.9 % IV SOLN
3.0000 g | Freq: Three times a day (TID) | INTRAVENOUS | Status: DC
  Administered 2016-07-09 – 2016-07-10 (×3): 3 g via INTRAVENOUS
  Filled 2016-07-09 (×4): qty 3

## 2016-07-09 NOTE — Progress Notes (Signed)
Speech Language Pathology Daily Session Note  Patient Details  Name: Nathan Frank MRN: [id number] Date of Birth: May 10, 1941  Today's Date: 07/09/2016 SLP Individual Time: 1000-1100 SLP Individual Time Calculation (min): 60 min   Short Term Goals: Week 1: SLP Short Term Goal 1 (Week 1): Patient will initiate functional tasks with no more than 2 repetition cues in 50% of given opportunities. SLP Short Term Goal 2 (Week 1): Patient will utilize compensatory strategies for naming retrieval at the sentence level given Mod A verbal and gesteral cues.  SLP Short Term Goal 3 (Week 1): Patient will demonstrate functional problem solving during basic tasks given Mod A verbal and visual cues.  SLP Short Term Goal 4 (Week 1): Patient will demonstrate intellectual awareness of 2 cognitive and 2 physical deficits given Mod A verbal cues.  SLP Short Term Goal 5 (Week 1): Patient will consume trials of regular textures and thin liquids via straw, demonstrating efficient mastication and oral clearance with no overt s/s of aspiration. SLP Short Term Goal 6 (Week 1): Patient will demonstrate 80% intelligibility at the sentence level given Min A verbal cues for overarticulation.   Skilled Therapeutic Interventions:Pt seen for skilled SLP treatment with focus on cognitive goals. Pt required min A to mantainsustained attention to task despite being in a quiet controlled environment. Basic coin counting required min- mod A for organization and accuracy. Mod A for functional math related to daily schedule. Mod A to recall events from earlier this date.   Function:  Eating Eating                 Cognition Comprehension Comprehension assist level: Understands basic 75 - 89% of the time/ requires cueing 10 - 24% of the time  Expression   Expression assist level: Expresses basic 90% of the time/requires cueing < 10% of the time.  Social Interaction Social Interaction assist level: Interacts appropriately  90% of the time - Needs monitoring or encouragement for participation or interaction.  Problem Solving Problem solving assist level: Solves basic 75 - 89% of the time/requires cueing 10 - 24% of the time  Memory Memory assist level: Recognizes or recalls 75 - 89% of the time/requires cueing 10 - 24% of the time    Pain Pain Assessment Pain Assessment: No/denies pain  Therapy/Group: Individual Therapy  Vinetta Bergamo MA, CCC-SLP 07/09/2016, 10:56 AM

## 2016-07-09 NOTE — Progress Notes (Signed)
Physical Therapy Session Note  Patient Details  Name: Nathan Frank MRN: [id number] Date of Birth: 1941/08/10  Today's Date: 07/09/2016 PT Individual Time: 7943-2761 and 1330-1415 PT Individual Time Calculation (min): 27 min and 45 min    Short Term Goals: Week 1:  PT Short Term Goal 1 (Week 1): Pt will increase bed mobility to mod A.  PT Short Term Goal 2 (Week 1): Pt will increase transfers bed to chair, chair to bed to mod A.  PT Short Term Goal 3 (Week 1): Pt will ambulate about 25 feet with rolling walker and mod A.  PT Short Term Goal 4 (Week 1): Pt ascend/descend 2 stairs with B rails and mod A.  PT Short Term Goal 5 (Week 1): Pt will propel w/c about 50 feet with min A.   Skilled Therapeutic Interventions/Progress Updates:    Session 1: Attempting to see pt early, pt declining out of room therapy as he reports he's waiting to go down for a chest x-ray, but agreeable to bedside therapy until transport arrives.  Pt mildly perseverative on pneumonia and chest x-ray throughout session.  PT instructed pt in use of acapella flutter-valve with pt demonstrating 12 reps with mod fade to min verbal cues for extended exhale and correct form.  PT instructed pt in deep cough x3 with small amount of sputum which pt swallowed with chin tuck.  PT switched out pt's leg rests for ELRs and provided education on keeping LEs elevated for edema management.  Pt declining further therapy at this time, focused on going to x-ray.  Left upright in w/c with QRB in place, call bell in reach, and needs met.    Session 2: Pt resting in bed upon PT arrival, no c/o pain and agreeable to therapy session.  PT donned shoes total assist for time management.  Supine>sit with more than a reasonable amount of time and overall min assist with HOB elevated and bedrails.  Stand/pivot with RW bed>wc with mod assist to rise and mod multimodal cues to square up to chair before attempting to sit.  Stand/pivot to nustep with increased  time and mod assist to rise.  Nustep x10 minutes at level 2 with 4 extremities for reciprocal stepping pattern, forced used, and attention to task.  Pt required mod verbal cues to stay attended to task for 10 minutes in mildly distracting environment.  Ambulatory transfer to therapy mat with mod assist to rise and facilitation for forward weight shift, verbal cues for hand placement.  Handoff to OT.    Therapy Documentation Precautions:  Precautions Precautions: Fall Restrictions Weight Bearing Restrictions: No   See Function Navigator for Current Functional Status.   Therapy/Group: Individual Therapy  Shizue Kaseman E Penven-Crew 07/09/2016, 11:54 AM

## 2016-07-09 NOTE — Progress Notes (Signed)
Occupational Therapy Session Note  Patient Details  Name: Nathan Frank MRN: [id number] Date of Birth: 06-02-1941  Today's Date: 07/09/2016 OT Individual Time: 5701-7793 OT Individual Time Calculation (min): 58 min     Short Term Goals:Week 1:  OT Short Term Goal 1 (Week 1): Pt will complete toilet transfer with Max A and 1 helper with LRAD OT Short Term Goal 2 (Week 1): Pt will complete sit<stand with LB dressing and Mod A OT Short Term Goal 3 (Week 1): Pt will self sequence BADL steps with mod vcs OT Short Term Goal 4 (Week 1): Pt will engage in 10 minute thearpeutic activity with 1 rest break to improve activity tolerance       Skilled Therapeutic Interventions/Progress Updates:    Pt seen for skilled OT to facilitate functional mobility of sit to stand skills, standing balance and use of LUE.  Pt received in bed and sat to EOB with min A. From elevated bed pt could stand to RW with min A and step to w/c with mod A to lower to seat smoothly.  Pt only had jeans that are not comfortable for him to wear, so used hospital gowns instead. Completed sponge bath as pt was connected to IV. During sponge bath, pt had increased AROM to wash R arm. Stood to sink but was nervous to release one hand to wash bottom. Cues to separate feet in standing and put more wt over ball of foot to increase balance stability. Pt was able to tolerate standing for at least 1 minute.  Pt attended to L side well today.  Pt resting in w/c with quick release belt on and all needs met.  (ordered XL TEDs)  Therapy Documentation Precautions:  Precautions Precautions: Fall Restrictions Weight Bearing Restrictions: No Vital Signs: Therapy Vitals Temp: 97.6 F (36.4 C) Temp Source: Oral Pulse Rate: (!) 115 Resp: 18 BP: (!) 124/96 Patient Position (if appropriate): Lying Oxygen Therapy SpO2: 100 % O2 Device: Not Delivered Pain:  no c/o pain ADL: ADL ADL Comments: Please see functional navigator for ADL  levels  See Function Navigator for Current Functional Status.   Therapy/Group: Individual Therapy  SAGUIER,JULIA 07/09/2016, 9:08 AM

## 2016-07-09 NOTE — Patient Care Conference (Signed)
Inpatient RehabilitationTeam Conference and Plan of Care Update Date: 07/08/2016   Time: 10:55 AM    Patient Name: Nathan Frank      Medical Record Number: [id number]  Date of Birth: 1940-12-16 Sex: Male         Room/Bed: 4W03C/4W03C-01 Payor Info: Payor: [employer] MEDICARE / Plan: [employer] MEDICARE / Product Type: *No Product type* /    Admitting Diagnosis: R CVA  Admit Date/Time:  07/04/2016  5:58 PM Admission Comments: No comment available   Primary Diagnosis:  Cerebral infarction due to embolism of right middle cerebral artery (HCC) Principal Problem: Cerebral infarction due to embolism of right middle cerebral artery [facility])  Patient Active Problem List   Diagnosis Date Noted  . Left hemiparesis ([place]) 07/04/2016  . Acute ischemic right MCA stroke ([place]) 07/04/2016  . Obesity 07/01/2016  . Family hx-stroke 07/01/2016  . Hyperglycemia 07/01/2016  . Wound of left leg 07/01/2016  . Dysphagia, post-stroke   . Dysarthria, post-stroke   . Pyrexia   . Bleeding gastrointestinal   . Polysubstance abuse   . Acute on chronic combined systolic and diastolic heart failure ([place])   . Mitral valve regurgitation   . Tachypnea   . Anemia of chronic disease   . Leukocytosis   . Cerebral infarction due to embolism of right middle cerebral artery ([place]) - s/p mechanical thrombectomy 06/25/2016  . Hypomagnesemia 06/09/2016  . Acute on chronic diastolic CHF (congestive heart failure) ([place]) 06/09/2016  . Alcoholic cirrhosis of liver with ascites ([place])   . Other specified hypothyroidism   . Hepatocellular carcinoma ([place])   . Alcohol withdrawal ([place]) 05/27/2016  . Generalized weakness 05/27/2016  . Hypokalemia 05/27/2016  . Dehydration 05/27/2016  . Diarrhea 04/28/2016  . SIRS (systemic inflammatory response syndrome) ([place]) 04/19/2016  . Alcohol use disorder, moderate, in controlled environment, dependence ([place])   . Lactic acidosis   . Tachycardia   . Atrial fibrillation with RVR ([place])  04/18/2016  . Chest pain 04/18/2016  . Hypotension 04/18/2016  . Elevated lactic acid level 04/18/2016  . Chronic atrial fibrillation ([place]) 04/18/2016  . SVT (supraventricular tachycardia) ([place])   . Prolonged Q-T interval on ECG   . Neuropathy ([place])   . Essential hypertension   . Thrombocytopenia ([place]) 04/16/2015  . Abnormal TSH 04/16/2015  . NSTEMI (non-ST elevated myocardial infarction) ([place])   . Acute gastric ulcer   . Acute gastritis with hemorrhage   . GI bleed due to NSAIDs 12/13/2014  . Right knee pain 12/13/2014  . Dizziness and giddiness 12/13/2014  . Upper GI bleeding 12/13/2014  . Heroin overdose 02/20/2014  . S/P alcohol detoxification 02/02/2014  . Alcohol dependence ([place]) 02/02/2014  . Depressive disorder 02/01/2014  . Symptomatic cholelithiasis 12/15/2013  . Abdominal pain 12/14/2013  . Gastritis 06/07/2012  . DVT of lower limb, acute ([place]) 06/06/2012  . Oral thrush 06/05/2012  . Alcohol abuse 06/04/2012  . Weight loss 06/04/2012  . 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 Liver cirrhosis ([place]) 06/04/2012  . Hematuria 06/04/2012    Expected Discharge Date: Expected Discharge Date: 07/30/16  Team Members Present: Physician leading conference: Dr. Alysia Penna Social Worker Present: Alfonse Alpers, LCSW Nurse Present: Heather Roberts, RN PT Present: Dwyane Dee, PT OT Present: Simonne Come, OT SLP Present: Windell Moulding, SLP PPS Coordinator present : Daiva Nakayama, RN, CRRN     Current Status/Progress Goal Weekly Team Focus  Medical   CKD, CHF, Liver failure  maintain hydration but avoid  swelling  fluid management   Bowel/Bladder   incontinent of bowel & bladder. condom cath at night. last bm 10/3 (required suppository)  continent  monitor bowel & bladder pattern   Swallow/Nutrition/ Hydration   Dys 3, thin liquids   mod I  trials of regular textures to continue working towards liquids progression    ADL's   sit to  stands, standing balance with mod A with more than reasonable amt of time, max to total A for bathing and dressing due to decr coordination and strength in UEs  supervision overall   standing tolerance, functional ambulation with RW, funcitonal use of bilateral UEs, sustained to selective attention, initiation   Mobility   min>max assist, poor motor planning, decreased awareness, L lean  supervision level overall, household ambulatory community w/c  motor planning, initiation, balance, transfers, weight bearing, midline orientation, gait training   Communication   mod assist for intelligibility   supervision   education and carryover of compensatory strategies pending mentation   Safety/Cognition/ Behavioral Observations  moderately severe cognitive impairment  supervision   basic cognition, task initiation, attention to tasks   Pain   no c/o pain         Skin   R leg skin tear with dressing  no breakdown, no infection       Rehab Goals Patient on target to meet rehab goals: Yes Rehab Goals Revised: none *See Care Plan and progress notes for long and short-term goals.  Barriers to Discharge: has cognitive deficits, multiple medical issues, wife appears forgetful    Possible Resolutions to Barriers:  cont rehab may need subspecialist consultation, renal +/- cardiology    Discharge Planning/Teaching Needs:  Pt to return to his home with his wife to provide supervision.  Wife and any other caregivers to come for family education closer to pt's d/c   Team Discussion:  Making progress toward his goal of supervision. Currently min-mod level of assist. MD addressing medical issues and adjusting medications. Will ask wife to be here to observe in therapies, since may need multiple days for family education.  Revisions to Treatment Plan:  None   Continued Need for Acute Rehabilitation Level of Care: The patient requires daily medical management by a physician with specialized training in  physical medicine and rehabilitation for the following conditions: Daily direction of a multidisciplinary physical rehabilitation program to ensure safe treatment while eliciting the highest outcome that is of practical value to the patient.: Yes Daily medical management of patient stability for increased activity during participation in an intensive rehabilitation regime.: Yes Daily analysis of laboratory values and/or radiology reports with any subsequent need for medication adjustment of medical intervention for : Cardiac problems;Neurological problems;Renal problems  Elease Hashimoto 07/09/2016, 3:09 PM

## 2016-07-09 NOTE — Progress Notes (Signed)
Occupational Therapy Session Note  Patient Details  Name: KALIQUE DUFEK MRN: [id number] Date of Birth: 1941-08-15  Today's Date: 07/09/2016 OT Individual Time: 1420-1500 OT Individual Time Calculation (min): 40 min     Short Term Goals: Week 1:  OT Short Term Goal 1 (Week 1): Pt will complete toilet transfer with Max A and 1 helper with LRAD OT Short Term Goal 2 (Week 1): Pt will complete sit<stand with LB dressing and Mod A OT Short Term Goal 3 (Week 1): Pt will self sequence BADL steps with mod vcs OT Short Term Goal 4 (Week 1): Pt will engage in 10 minute thearpeutic activity with 1 rest break to improve activity tolerance   Skilled Therapeutic Interventions/Progress Updates:    Engaged in treatment session with focus on activity tolerance, initiation, attention to task, and LUE NMR.  Pt received in therapy gym post PT session.  Engaged in 3D pipe tree puzzle in sitting with focus on sustained attention to task, initiation, and functional use of LUE.  Pt required increased cues for problem solving with cues to correctly identify appropriate pieces as well as orient them correctly.  Increased time provided for initiation throughout activity.  Pt easily distracted by other pt's in the gym requiring cues to reorient to task at hand.  Noted decreased sustained attention to LUE while placing pieces with pt demonstrating decreased motor coordination and manipulation of pieces to place correctly.  Transferred sit > stand with min guard and then provided min assist and cues for transfer to w/c.  Left upright in w/c in room with all needs in reach.  Therapy Documentation Precautions:  Precautions Precautions: Fall Restrictions Weight Bearing Restrictions: No Pain:  Pt with no c/o pain  See Function Navigator for Current Functional Status.   Therapy/Group: Individual Therapy  Simonne Come 07/09/2016, 3:08 PM

## 2016-07-09 NOTE — Progress Notes (Signed)
Social Work Patient ID: Nathan Frank, male   DOB: Sep 20, 1941, 75 y.o.   MRN: [id number]   CSW met with pt 07-08-16 to update him on team conference discussion and targeted d/c date of 07-30-16.  Pt felt that was a long time away, but wants to get stronger and more independent, so he's agreeable.  Stated his wife was due to visit anytime, but CSW never did see her to update her.  CSW will call wife at home to give her update and assess her ability/willingness to provide supervision to pt.  CSW to continue to follow and assist as needed.

## 2016-07-09 NOTE — Progress Notes (Signed)
[facility]IC[facility] & REHABILITATION     PROGRESS NOTE    Subjective/Complaints: Patient appears alert and brighter today. Sitting up at edge of bed. Getting ready to stand up with a Clarise Cruz lift.  ROS: Pt denies fever, rash/itching, headache, blurred or double vision, nausea, vomiting, abdominal pain, diarrhea, chest pain, shortness of breath, palpitations, dysuria, dizziness, neck or back pain, bleeding, anxiety, or depression   Objective: Vital Signs: Blood pressure (!) 124/96, pulse (!) 115, temperature 97.6 F (36.4 C), temperature source Oral, resp. rate 18, height 6' (1.829 m), weight 91.5 kg (201 lb 11.5 oz), SpO2 100 %. Dg Chest 2 View  Result Date: 07/08/2016 CLINICAL DATA:  Shortness of breath with exertion EXAM: CHEST  2 VIEW COMPARISON:  07/03/2016 FINDINGS: Heart is mildly enlarged. Airspace opacity noted posteriorly on the lateral view, likely in the right lower lobe concerning for pneumonia. No effusions. No acute bony abnormality. IMPRESSION: Posterior airspace opacity on the lateral view most compatible with right lower lobe pneumonia. Mild cardiomegaly. Electronically Signed   By: Rolm Baptise M.D.   On: 07/08/2016 16:13    Recent Labs  07/09/16 0951  WBC 4.2  HGB 9.8*  HCT 29.2*  PLT 247    Recent Labs  07/07/16 1035  NA 142  K 3.4*  CL 119*  GLUCOSE 93  BUN 14  CREATININE 1.61*  CALCIUM 8.4*   CBG (last 3)  No results for input(s): GLUCAP in the last 72 hours.  Wt Readings from Last 3 Encounters:  07/09/16 91.5 kg (201 lb 11.5 oz)  06/30/16 103.8 kg (228 lb 14.4 oz)  06/16/16 92.6 kg (204 lb 3.2 oz)    Physical Exam:  Constitutional: He is oriented to person, place, and time. He appears well-developed and well-nourished.  HENT:  Head: Normocephalic and atraumatic.  Eyes: Conjunctivae are normal. Pupils are equal, round, and reactive to light.  Neck: Normal range of motion. Neck supple.  Cardiovascular: Normal rate.  An irregular  rhythm present. Exam reveals no gallop and no friction rub.   No murmur heard. Respiratory: Effort normal. No stridor. No respiratory distress. He has no wheezes. He has rhonchi. He has no rales.  GI: Soft. Bowel sounds are increased. There is no tenderness. There is no rebound and no guarding.  Mild distention  Ext: 2+ edema BLE Neurological: He is alert and oriented to person, place, and time.  Left facial weakness with minimal dysarthria and occasional wet voice. Flat affect with delayed processing and slow movements.Fair insight and awareness. He is able to follow simple one and two step motor commands.  Left sided weakness: LUW 2/5 deltoid, bicep, tricep, wrist, hand, 1+ to 2-/5 HF, KE, ADF/PF. Senses pain Left arm and leg.   Skin: Skin is warm and dry. No rash noted.  BLE with dry flaky skin.  Psychiatric: His affect is blunt. His speech is delayed. He is slowed.   Assessment/Plan: 1. 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 Physiatrist is providing close team supervision and 24 hour management of active medical problems listed below. Physiatrist and rehab team continue to assess barriers to discharge/monitor patient progress toward functional and medical goals.  Function:  Bathing Bathing position   Position: Wheelchair/chair at sink  Bathing parts Body parts bathed by patient: Left arm, Chest, Abdomen, Front perineal area, Right upper leg, Left upper leg, Right arm Body parts bathed  by helper: Buttocks, Right lower leg, Left lower leg, Back  Bathing assist Assist Level: Touching or steadying assistance(Pt > 75%)      Upper Body Dressing/Undressing Upper body dressing   What is the patient wearing?: Hospital gown                Upper body assist Assist Level: Touching or steadying assistance(Pt > 75%)      Lower Body  Dressing/Undressing Lower body dressing   What is the patient wearing?: Socks, Shoes   Underwear - Performed by helper: Thread/unthread right underwear leg, Thread/unthread left underwear leg, Pull underwear up/down       Non-skid slipper socks- Performed by helper: Don/doff right sock, Don/doff left sock   Socks - Performed by helper: Don/doff right sock, Don/doff left sock   Shoes - Performed by helper: Don/doff right shoe, Don/doff left shoe          Lower body assist Assist for lower body dressing:  (helper did all tasks, total assist)      Toileting Toileting Toileting activity did not occur: No continent bowel/bladder event   Toileting steps completed by helper: Adjust clothing prior to toileting, Performs perineal hygiene, Adjust clothing after toileting Toileting Assistive Devices: Grab bar or rail  Toileting assist Assist level:  (total assist)   Transfers Chair/bed transfer   Chair/bed transfer method: Stand pivot Chair/bed transfer assist level: Moderate assist (Pt 50 - 74%/lift or lower) Chair/bed transfer assistive device: Armrests, Walker Mechanical lift: Ecologist     Max distance: 50 Assist level: Moderate assist (Pt 50 - 74%)   Wheelchair   Type: Manual Max wheelchair distance: 5 Assist Level: Maximal assistance (Pt 25 - 49%)  Cognition Comprehension Comprehension assist level: Understands basic 75 - 89% of the time/ requires cueing 10 - 24% of the time  Expression Expression assist level: Expresses basic 90% of the time/requires cueing < 10% of the time.  Social Interaction Social Interaction assist level: Interacts appropriately 90% of the time - Needs monitoring or encouragement for participation or interaction.  Problem Solving Problem solving assist level: Solves basic 75 - 89% of the time/requires cueing 10 - 24% of the time  Memory Memory assist level: Recognizes or recalls 75 - 89% of the time/requires cueing 10 - 24% of  the time   Medical Problem List and Plan: 1.  Left hemiparesis and funtctional deficits secondary to right MCA embolic infarct  -Continue CIR PT, OT, Speech therapy,  Formulized individual treatment plan based on medical history, underlying problem and comorbidities. 2.  DVT Prophylaxis/Anticoagulation:   Lovenox 3. Pain Management: tylenol prn. Support limbs as needed to maintain joint position and prevent contracture 4. Mood: team to follow for ego support. SW to assess upon admit.  5. Neuropsych: This patient is not yet capable of making decisions on his own behalf. 6. Skin/Wound Care: encourage nutrition and pressure relief as needed 7. Fluids/Electrolytes/Nutrition: encourage PO. IV fluids d/ced due to peripheral edema 8. Afib: ASA daily             -metoprolol increased to 50mg  BID for better rate control Vitals:   07/09/16 0638 07/09/16 0830  BP: 118/82 (!) 124/96  Pulse: 92 (!) 115  Resp: 18   Temp: 97.6 F (36.4 C)                -no evidence of bradycardia             -adjust regimen as needed  9. AKI: responding to IV hydration, however, patient is developing increased peripheral edema likely related to his low serum albumin. Will stop nighttime IV fluids             -continue to push pos  -.  10. Hypokalemia: continue to replete potassium             -Repeat BMET stable, looks like creat is at basline, his BUN may need to run closer to 30 to achieve improvements in peripheral edema, has been on spironolactone and potassium and Lasix at home. Will restart low-dose spironolactone.Lasix to restart today 11. Anemia:  Hgb 14.1 at admission down to --->8.5. 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             -monitor LFT's and ammonia as needed                    -no evidence of encephalopathy. At the current time 75. HTN--metoprolol 14.   CHF ef 40-45% monitor for pulm edema,CXR, was read as right lower lobe infiltrate on 10 4 and repeat on 10 5 with report pending. I viewed the films as well and it looks like patient has some bibasilar atelectasis would like to see with the radiology reading is. At this point, we'll keep him on antibiotics for presumed pneumonia. However, he does not have an elevated white count or fever LOS (Days) 5 A FACE TO FACE EVALUATION WAS PERFORMED  Charlett Blake 07/09/2016 12:43 PM

## 2016-07-10 ENCOUNTER — Inpatient Hospital Stay (HOSPITAL_COMMUNITY): Payer: Medicare Other | Admitting: Speech Pathology

## 2016-07-10 ENCOUNTER — Inpatient Hospital Stay (HOSPITAL_COMMUNITY): Payer: Medicare Other | Admitting: Occupational Therapy

## 2016-07-10 ENCOUNTER — Inpatient Hospital Stay (HOSPITAL_COMMUNITY): Payer: Self-pay | Admitting: Physical Therapy

## 2016-07-10 ENCOUNTER — Inpatient Hospital Stay (HOSPITAL_COMMUNITY): Payer: Self-pay | Admitting: Occupational Therapy

## 2016-07-10 LAB — BASIC METABOLIC PANEL
Anion gap: 8 (ref 5–15)
BUN: 8 mg/dL (ref 6–20)
CALCIUM: 8.3 mg/dL — AB (ref 8.9–10.3)
CHLORIDE: 113 mmol/L — AB (ref 101–111)
CO2: 21 mmol/L — AB (ref 22–32)
CREATININE: 1.45 mg/dL — AB (ref 0.61–1.24)
GFR calc Af Amer: 53 mL/min — ABNORMAL LOW (ref >60)
GFR calc non Af Amer: 46 mL/min — ABNORMAL LOW (ref >60)
Glucose, Bld: 97 mg/dL (ref 65–99)
Potassium: 2.9 mmol/L — ABNORMAL LOW (ref 3.5–5.1)
Sodium: 142 mmol/L (ref 135–145)

## 2016-07-10 MED ORDER — FUROSEMIDE 40 MG PO TABS
40.0000 mg | ORAL_TABLET | Freq: Two times a day (BID) | ORAL | Status: DC
  Administered 2016-07-10 – 2016-07-15 (×11): 40 mg via ORAL
  Filled 2016-07-10 (×12): qty 1

## 2016-07-10 MED ORDER — POTASSIUM CHLORIDE 20 MEQ/15ML (10%) PO SOLN
20.0000 meq | Freq: Three times a day (TID) | ORAL | Status: DC
  Administered 2016-07-10 – 2016-07-13 (×9): 20 meq via ORAL
  Filled 2016-07-10 (×9): qty 15

## 2016-07-10 NOTE — Plan of Care (Signed)
Problem: RH BLADDER ELIMINATION Goal: RH STG MANAGE BLADDER WITH ASSISTANCE STG Manage Bladder With Mod Assistance   Outcome: Not Progressing Condom cath  Goal: RH STG MANAGE BLADDER WITH EQUIPMENT WITH ASSISTANCE STG Manage Bladder With Equipment With Mod Assistance   Outcome: Not Progressing Condom cath Goal: RH OTHER STG BLADDER ELIMINATION GOALS W/ASSIST Other STG Bladder Elimination Goals With Mod Assistance   Outcome: Not Progressing Condom cath

## 2016-07-10 NOTE — Progress Notes (Signed)
Speech Language Pathology Daily Session Note  Patient Details  Name: Nathan Frank MRN: [id number] Date of Birth: 10-Jun-1941  Today's Date: 07/10/2016 SLP Individual Time: [id number] SLP Individual Time Calculation (min): 25 min   Short Term Goals: Week 1: SLP Short Term Goal 1 (Week 1): Patient will initiate functional tasks with no more than 2 repetition cues in 50% of given opportunities. SLP Short Term Goal 2 (Week 1): Patient will utilize compensatory strategies for naming retrieval at the sentence level given Mod A verbal and gesteral cues.  SLP Short Term Goal 3 (Week 1): Patient will demonstrate functional problem solving during basic tasks given Mod A verbal and visual cues.  SLP Short Term Goal 4 (Week 1): Patient will demonstrate intellectual awareness of 2 cognitive and 2 physical deficits given Mod A verbal cues.  SLP Short Term Goal 5 (Week 1): Patient will consume trials of regular textures and thin liquids via straw, demonstrating efficient mastication and oral clearance with no overt s/s of aspiration. SLP Short Term Goal 6 (Week 1): Patient will demonstrate 80% intelligibility at the sentence level given Min A verbal cues for overarticulation.   Skilled Therapeutic Interventions:  Pt was seen for skilled ST targeting cognitive goals.  SLP facilitated the session with a novel card game targeting functional problem solving.  Pt was able to complete mental math calculations during card game with min assist faded to supervision verbal cues for awareness of errors.  During functional conversations with SLP, pt was able to identify at least 1 physical deficits s/p CVA with mod I; no cognitive deficits endorsed on this date despite education regarding rationale for ST follow up and current goals in ST.  Pt was left in bed at the end of today's therapy session with call bell within reach.  Continue per current plan of care.    Function:  Eating Eating                  Cognition Comprehension Comprehension assist level: Understands basic 90% of the time/cues < 10% of the time  Expression   Expression assist level: Expresses basic needs/ideas: With extra time/assistive device  Social Interaction Social Interaction assist level: Interacts appropriately 90% of the time - Needs monitoring or encouragement for participation or interaction.  Problem Solving Problem solving assist level: Solves basic 75 - 89% of the time/requires cueing 10 - 24% of the time  Memory Memory assist level: Recognizes or recalls 75 - 89% of the time/requires cueing 10 - 24% of the time    Pain Pain Assessment Pain Assessment: No/denies pain  Therapy/Group: Individual Therapy  Jakiera Ehler, Selinda Orion 07/10/2016, 4:23 PM

## 2016-07-10 NOTE — Progress Notes (Signed)
[facility] MEDICINE & REHABILITATION     PROGRESS NOTE    Subjective/Complaints: Patient sitting at edge of bed. With occupational therapy. No problems overnight. He feels like his breathing still okay. Still feels swollen in both legs and feet.  ROS: Pt denies fever, rash/itching, headache, blurred or double vision, nausea, vomiting, abdominal pain, diarrhea, chest pain, shortness of breath, palpitations, dysuria, dizziness, neck or back pain, bleeding, anxiety, or depression   Objective: Vital Signs: Blood pressure 116/77, pulse 92, temperature 98.3 F (36.8 C), temperature source Oral, resp. rate 18, height 6' (1.829 m), weight 96.4 kg (212 lb 8 oz), SpO2 100 %. Dg Chest 2 View  Result Date: 07/09/2016 CLINICAL DATA:  CHF EXAM: CHEST  2 VIEW COMPARISON:  07/08/2016 FINDINGS: Enlargement cardiac silhouette with slight pulmonary vascular congestion. Atherosclerotic calcification at arch. Mediastinal contours normal for AP technique on from view. Mild bibasilar atelectasis versus infiltrate. Remaining lungs clear. Minimal central peribronchial thickening, chronic. No pleural effusion or pneumothorax. Bones demineralized. IMPRESSION: Enlargement of cardiac silhouette with pulmonary vascular congestion. Bibasilar atelectasis versus infiltrate slightly increased on LEFT with small bibasilar effusions and mild chronic peribronchial thickening. Electronically Signed   By: Lavonia Dana M.D.   On: 07/09/2016 12:48   Dg Chest 2 View  Result Date: 07/08/2016 CLINICAL DATA:  Shortness of breath with exertion EXAM: CHEST  2 VIEW COMPARISON:  07/03/2016 FINDINGS: Heart is mildly enlarged. Airspace opacity noted posteriorly on the lateral view, likely in the right lower lobe concerning for pneumonia. No effusions. No acute bony abnormality. IMPRESSION: Posterior airspace opacity on the lateral view most compatible with right lower lobe pneumonia. Mild cardiomegaly. Electronically Signed   By:  Rolm Baptise M.D.   On: 07/08/2016 16:13    Recent Labs  07/09/16 0951  WBC 4.2  HGB 9.8*  HCT 29.2*  PLT 247    Recent Labs  07/10/16 0706  NA 142  K 2.9*  CL 113*  GLUCOSE 97  BUN 8  CREATININE 1.45*  CALCIUM 8.3*   CBG (last 3)  No results for input(s): GLUCAP in the last 72 hours.  Wt Readings from Last 3 Encounters:  07/10/16 96.4 kg (212 lb 8 oz)  06/30/16 103.8 kg (228 lb 14.4 oz)  06/16/16 92.6 kg (204 lb 3.2 oz)    Physical Exam:  Constitutional: He is oriented to person, place, and time. He appears well-developed and well-nourished.  HENT:  Head: Normocephalic and atraumatic.  Eyes: Conjunctivae are normal. Pupils are equal, round, and reactive to light.  Neck: Normal range of motion. Neck supple.  Cardiovascular: Normal rate.  An irregular rhythm present. Exam reveals no gallop and no friction rub.   No murmur heard. Respiratory: Effort normal. No stridor. No respiratory distress. He has no wheezes. He has rhonchi. He has no rales.  GI: Soft. Bowel sounds are increased. There is no tenderness. There is no rebound and no guarding.  Mild distention  Ext: 2+ edema BLE Neurological: He is alert and oriented to person, place, and time.  Left facial weakness with minimal dysarthria and occasional wet voice. Flat affect with delayed processing and slow movements.Fair insight and awareness. He is able to follow simple one and two step motor commands.  Left sided weakness: LUW 2/5 deltoid, bicep, tricep, wrist, hand, 1+ to 2-/5 HF, KE, ADF/PF. Senses pain Left arm and leg.   Skin: Skin is warm and dry. No rash noted.  BLE with dry flaky skin.  Psychiatric: His affect is blunt.  His speech is delayed. He is slowed.   Assessment/Plan: 1. 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 Physiatrist is providing close team supervision  and 24 hour management of active medical problems listed below. Physiatrist and rehab team continue to assess barriers to discharge/monitor patient progress toward functional and medical goals.  Function:  Bathing Bathing position   Position: Wheelchair/chair at sink  Bathing parts Body parts bathed by patient: Left arm, Chest, Abdomen, Front perineal area, Right upper leg, Left upper leg, Right arm Body parts bathed by helper: Buttocks, Right lower leg, Left lower leg, Back  Bathing assist Assist Level:  (Mod assist)      Upper Body Dressing/Undressing Upper body dressing   What is the patient wearing?: Pull over shirt/dress     Pull over shirt/dress - Perfomed by patient: Thread/unthread right sleeve Pull over shirt/dress - Perfomed by helper: Put head through opening, Pull shirt over trunk, Thread/unthread left sleeve        Upper body assist Assist Level:  (Max assist)      Lower Body Dressing/Undressing Lower body dressing   What is the patient wearing?: Pants, Shoes, [employer] - Performed by helper: Thread/unthread right underwear leg, Thread/unthread left underwear leg, Pull underwear up/down   Pants- Performed by helper: Thread/unthread right pants leg, Thread/unthread left pants leg, Pull pants up/down   Non-skid slipper socks- Performed by helper: Don/doff right sock, Don/doff left sock   Socks - Performed by helper: Don/doff right sock, Don/doff left sock   Shoes - Performed by helper: Don/doff right shoe, Don/doff left shoe, Fasten right, Fasten left       TED Hose - Performed by helper: Don/doff right TED hose, Don/doff left TED hose  Lower body assist Assist for lower body dressing:  (Total assist)      Toileting Toileting Toileting activity did not occur: No continent bowel/bladder event   Toileting steps completed by helper: Adjust clothing prior to toileting, Performs perineal hygiene, Adjust clothing after toileting Toileting Assistive  Devices: Grab bar or rail  Toileting assist Assist level:  (total assist)   Transfers Chair/bed transfer   Chair/bed transfer method: Stand pivot Chair/bed transfer assist level: Moderate assist (Pt 50 - 74%/lift or lower) Chair/bed transfer assistive device: Armrests, Bedrails Mechanical lift: Stedy   Locomotion Ambulation     Max distance: 50 Assist level: Moderate assist (Pt 50 - 74%)   Wheelchair   Type: Manual Max wheelchair distance: 5 Assist Level: Maximal assistance (Pt 25 - 49%)  Cognition Comprehension Comprehension assist level: Understands basic 75 - 89% of the time/ requires cueing 10 - 24% of the time  Expression Expression assist level: Expresses basic 90% of the time/requires cueing < 10% of the time.  Social Interaction Social Interaction assist level: Interacts appropriately 90% of the time - Needs monitoring or encouragement for participation or interaction.  Problem Solving Problem solving assist level: Solves basic 75 - 89% of the time/requires cueing 10 - 24% of the time  Memory Memory assist level: Recognizes or recalls 75 - 89% of the time/requires cueing 10 - 24% of the time   Medical Problem List and Plan: 1.  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 2.  DVT Prophylaxis/Anticoagulation:   Lovenox 3. Pain Management: tylenol prn. Support limbs as needed to maintain joint position and prevent contracture 4. Mood: team to follow for ego support. SW to assess upon admit.  5. Neuropsych: This patient is not yet capable of making decisions on his own behalf. 6. Skin/Wound Care: encourage nutrition and pressure relief as needed 7. Fluids/Electrolytes/Nutrition: encourage PO. IV fluids d/ced due to peripheral  edema 8. Afib: ASA daily             -metoprolol increased to 50mg  BID for better rate control Vitals:   07/09/16 2023 07/10/16 0500  BP: 125/88 116/77  Pulse: (!) 117 92  Resp:  18  Temp:  98.3 F (36.8 C)               -no evidence of bradycardia             -adjust regimen as needed 9. AKI: responding to IV hydration, however, patient is developing increased peripheral edema likely related to his low serum albumin. Will stop nighttime IV fluids             -continue to push pos  -.  10. Hypokalemia: continue to replete potassium             -Repeat BMET stable, looks like creat is at basline, his BUN may need to run closer to 30 to achieve improvements in peripheral edema, has been on spironolactone and potassium and Lasix at home. We will continue these medications. Increase task see him, increased Lasix 11. Anemia:  Hgb 14.1 at admission down to --->8.5. 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             -monitor LFT's and ammonia as needed                    -no evidence of encephalopathy. At the current time 1. HTN--metoprolol 14.  CHF ef 40-45% monitor for pulm edema,CXR, was read as right lower lobe infiltrate on 10 4 and repeat on 10 5 with report pending. I viewed the films as well and it looks like patient has some bibasilar atelectasis , some evidence of pulmonary edema. , he does not have an elevated white count or fever . Will increase Lasix LOS (Days) 6 A FACE TO FACE EVALUATION WAS PERFORMED  KIRSTEINS,ANDREW E 07/10/2016 1:19 PM

## 2016-07-10 NOTE — Progress Notes (Signed)
Occupational Therapy Session Note  Patient Details  Name: Nathan Frank MRN: [id number] Date of Birth: [birth year]  Today's Date: 07/10/2016 OT Individual Time: [id number] and [id number] OT Individual Time Calculation (min): 60 min and 30 min    Short Term Goals: Week 1:  OT Short Term Goal 1 (Week 1): Pt will complete toilet transfer with Max A and 1 helper with LRAD OT Short Term Goal 2 (Week 1): Pt will complete sit<stand with LB dressing and Mod A OT Short Term Goal 3 (Week 1): Pt will self sequence BADL steps with mod vcs OT Short Term Goal 4 (Week 1): Pt will engage in 10 minute thearpeutic activity with 1 rest break to improve activity tolerance   Skilled Therapeutic Interventions/Progress Updates:    1)Treatment session with focus on initiation, problem solving, and sequencing with transfers and bathing and dressing.  Pt received in bed with RN present administering medications.  Pt requested to bathe at sink this session.  Required increased time and cues for initiation of sit > stand from EOB, initially planning to incorporate use of RW but due to decreased initiation completed stand pivot to w/c without AD.  Max cues and increased wait time for initiation and sequencing with bathing and dressing.  Unable to don shirt with decreased attention to LUE when donning shirt.  Cues for weight shifting for sit > stand to decrease burden on caregiver as well as stance in standing to increase support and allow for washing of buttocks and pulling up pants.  Pt left seated upright in w/c with elevating leg rests to attempt to decrease swelling as well as TEDS donned.  2)Treatment session with focus on functional use of LUE and sustained attention to task.  Pt received supine in bed declining OOB activity as he had just returned to bed from PT session.  Engaged in table top task in chair position in bed with focus on attention to Lt and functional use of LUE.  Engaged in Connect 4 activity with pt  picking up checkers with Lt hand 75% of task, reporting fatigue as task continued.  Increased time for initiation and recall of rules of activity with pt requiring min-mod cues for problem solving.  Therapy Documentation Precautions:  Precautions Precautions: Fall Restrictions Weight Bearing Restrictions: No Pain: Pain Assessment Pain Assessment: No/denies pain  See Function Navigator for Current Functional Status.   Therapy/Group: Individual Therapy  Luana Tatro, [place] 07/10/2016, 11:05 AM

## 2016-07-10 NOTE — Progress Notes (Signed)
Physical Therapy Session Note  Patient Details  Name: Nathan Frank MRN: [id number] Date of Birth: October 02, 1941  Today's Date: 07/10/2016 PT Individual Time: 1300-1410 PT Individual Time Calculation (min): 70 min    Short Term Goals: Week 1:  PT Short Term Goal 1 (Week 1): Pt will increase bed mobility to mod A.  PT Short Term Goal 2 (Week 1): Pt will increase transfers bed to chair, chair to bed to mod A.  PT Short Term Goal 3 (Week 1): Pt will ambulate about 25 feet with rolling walker and mod A.  PT Short Term Goal 4 (Week 1): Pt ascend/descend 2 stairs with B rails and mod A.  PT Short Term Goal 5 (Week 1): Pt will propel w/c about 50 feet with min A.   Skilled Therapeutic Interventions/Progress Updates:    Pt resting in w/c upon arrival with no c/o pain and agreeable to therapy session.  W/C propulsion x60' with overall supervision but occasional hand/hand assist for LUE propulsion on drive wheel and more than a reasonable amount of time (20 minutes to propel 60').  PT applied theraband to drive wheels for increased efficiency with w/c propulsion.  Sit>stand with increased time for initiation and overall mod assist to lift and for forward weight shift in standing, pt noted to be wet upon standing.  Returned to room in w/c.  Mod assist for sit>stand and ambulation into bathroom.  Total assist for PT to remove wet pants and brief, condom catheter noted to be stuck to pt's leg.  RN notified and requested pt be assisted back to bed to replace catheter.  Pt ambulated back to bed with RW, min assist to stand from elevated toilet seat with RW and grab bar.  Rolling L and R with supervision/min assist to don brief and draw sheet used to pull pt up in bed.  Pt positioned to comfort with call bell in reach and needs met.    Therapy Documentation Precautions:  Precautions Precautions: Fall Restrictions Weight Bearing Restrictions: No   See Function Navigator for Current Functional  Status.   Therapy/Group: Individual Therapy  Earnest Conroy Penven-Crew 07/10/2016, 2:14 PM

## 2016-07-11 ENCOUNTER — Inpatient Hospital Stay (HOSPITAL_COMMUNITY): Payer: Self-pay | Admitting: Occupational Therapy

## 2016-07-11 ENCOUNTER — Inpatient Hospital Stay (HOSPITAL_COMMUNITY): Payer: Medicare Other | Admitting: Speech Pathology

## 2016-07-11 LAB — BASIC METABOLIC PANEL
ANION GAP: 10 (ref 5–15)
BUN: 8 mg/dL (ref 6–20)
CHLORIDE: 109 mmol/L (ref 101–111)
CO2: 22 mmol/L (ref 22–32)
CREATININE: 1.36 mg/dL — AB (ref 0.61–1.24)
Calcium: 8.2 mg/dL — ABNORMAL LOW (ref 8.9–10.3)
GFR calc non Af Amer: 49 mL/min — ABNORMAL LOW (ref >60)
GFR, EST AFRICAN AMERICAN: 57 mL/min — AB (ref >60)
Glucose, Bld: 82 mg/dL (ref 65–99)
POTASSIUM: 3.2 mmol/L — AB (ref 3.5–5.1)
SODIUM: 141 mmol/L (ref 135–145)

## 2016-07-11 NOTE — Progress Notes (Signed)
  [facility] PHYSICAL MEDICINE & REHABILITATION     PROGRESS NOTE    Subjective/Complaints: Patient lying in bed. No complaints. He knows that his legs are swollen.   Objective: Vital Signs: Blood pressure 121/76, pulse 79, temperature 98.2 F (36.8 C), temperature source Oral, resp. rate 18, height 6' (1.829 m), weight 213 lb 3 oz (96.7 kg), SpO2 97 %.  No acute distress. Neck is supple. Chest clear to auscultation Cardiac exam S1 and S2 are irregularly irregular. Extremities 3+ edema to the knees bilaterally.  Assessment/Plan: 1. 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  Left hemiparesis and funtctional deficits secondary to right MCA embolic infarct  Inpatient rehabilitation-continue. 2.  DVT Prophylaxis/Anticoagulation:   Lovenox 3. Pain Management: tylenol prn. Support limbs as needed to maintain joint position and prevent contracture 4. Mood:  5. Neuropsych: This patient is not yet capable of making decisions on his own behalf. 6. Skin/Wound Care: encourage nutrition and pressure relief as needed 7. Fluids/Electrolytes/Nutrition: encourage PO. He has persistent lower extremity edema. Basic Metabolic Panel:    Component Value Date/Time   NA 141 07/11/2016 0522   K 3.2 (L) 07/11/2016 0522   CL 109 07/11/2016 0522   CO2 22 07/11/2016 0522   BUN 8 07/11/2016 0522   CREATININE 1.36 (H) 07/11/2016 0522   CREATININE 0.72 12/13/2015 1226   GLUCOSE 82 07/11/2016 0522   CALCIUM 8.2 (L) 07/11/2016 0522    8. Afib: ASA daily             -metoprolol increased to 50mg  BID for better rate control Vitals:   07/11/16 0518 07/11/16 0843  BP: 122/81 121/76  Pulse: 86 79  Resp: 18   Temp: 98.2 F (36.8 C)                -On my exam rate is reasonably controlled. 9. AKI: responding to IV hydration, however, patient is developing increased peripheral edema likely related to his low serum albumin. Will  stop nighttime IV fluids             -continue to push pos  -.  Lab Results  Component Value Date   CREATININE 1.36 (H) 07/11/2016   CREATININE 1.45 (H) 07/10/2016   CREATININE 1.61 (H) 07/07/2016    10. Hypokalemia: continue to replete potassium             -continue replacement   11. Anemia:  Hgb 14.1 at admission down to --->8.5. Back to 9.5 on 9/30            likely anemia of chronic dzs 12. Cirrhosis of Liver.             - 13. HTN--metoprolol- controlled 14.  CHF ef 40-45%  Continue lasix LOS (Days) 7 A FACE TO FACE EVALUATION WAS PERFORMED  Ugochi Henzler H Tyde Lamison 07/11/2016 9:16 AM

## 2016-07-11 NOTE — Progress Notes (Signed)
Speech Language Pathology Daily Session Note  Patient Details  Name: Nathan Frank MRN: [id number] Date of Birth: [birth year]  Today's Date: 07/11/2016 SLP Individual Time: 1000-1045 SLP Individual Time Calculation (min): 45 min   Short Term Goals: Week 1: SLP Short Term Goal 1 (Week 1): Patient will initiate functional tasks with no more than 2 repetition cues in 50% of given opportunities. SLP Short Term Goal 2 (Week 1): Patient will utilize compensatory strategies for naming retrieval at the sentence level given Mod A verbal and gesteral cues.  SLP Short Term Goal 3 (Week 1): Patient will demonstrate functional problem solving during basic tasks given Mod A verbal and visual cues.  SLP Short Term Goal 4 (Week 1): Patient will demonstrate intellectual awareness of 2 cognitive and 2 physical deficits given Mod A verbal cues.  SLP Short Term Goal 5 (Week 1): Patient will consume trials of regular textures and thin liquids via straw, demonstrating efficient mastication and oral clearance with no overt s/s of aspiration. SLP Short Term Goal 6 (Week 1): Patient will demonstrate 80% intelligibility at the sentence level given Min A verbal cues for overarticulation.   Skilled Therapeutic Interventions:  Skilled treatment session focused on dysphagia and cognition goals. SLP facilitated session by providing trials of thin liquids via straw. Pt without any overt s/s of aspiration. Pt stated that he was very tired because he had been up for most of the night. Pt required Max A to Mod A verbal cues for functional problem solving during basic tasks possibly related to fatigue. Pt with difficulty recalling cognitive deficits s/p CVA and was perseverative on being tired d/t lack of sleep. Pt left in bed with all needs within reach. Continue current plan of care.   Function:    Cognition Comprehension Comprehension assist level: Understands basic 90% of the time/cues < 10% of the time  Expression    Expression assist level: Expresses basic needs/ideas: With extra time/assistive device  Social Interaction Social Interaction assist level: Interacts appropriately 90% of the time - Needs monitoring or encouragement for participation or interaction.  Problem Solving Problem solving assist level: Solves basic 75 - 89% of the time/requires cueing 10 - 24% of the time  Memory Memory assist level: Recognizes or recalls 75 - 89% of the time/requires cueing 10 - 24% of the time    Pain Pain Assessment Pain Assessment: No/denies pain  Therapy/Group: Individual Therapy  Gale Hulse 07/11/2016, 10:41 AM

## 2016-07-11 NOTE — Progress Notes (Addendum)
Occupational Therapy Session Note  Patient Details  Name: Nathan Frank MRN: [id number] Date of Birth: 02-Jan-1941  Today's Date: 07/11/2016 OT Individual Time: [id number] Individual Time Calculation (min): 72 min     Short Term Goals: Week 1:  OT Short Term Goal 1 (Week 1): Pt will complete toilet transfer with Max A and 1 helper with LRAD OT Short Term Goal 2 (Week 1): Pt will complete sit<stand with LB dressing and Mod A OT Short Term Goal 3 (Week 1): Pt will self sequence BADL steps with mod vcs OT Short Term Goal 4 (Week 1): Pt will engage in 10 minute thearpeutic activity with 1 rest break to improve activity tolerance   Skilled Therapeutic Interventions/Progress Updates:   Pt was seen for extra skilled OT time with focus on cognitive retraining, self care mgt retraining, and safety with functional transfer completion. At time of arrival pt reported needing to use the restroom. Transfer from bed to w/c completed with Mod A and 1 standby assist CNA helper. Toilet transfer completed via stand pivot with use of grab bar and Mod A. Max a for toileting tasks. Afterwards pt was agreeable to complete ADLs with setup sinkside. Pt completed with overall Max A with pt presenting with apraxia/delayed processing during self care. Questioning cues provided to encourage pt to self sequence and problem solve. Pt still requires mod-max vcs for sequencing and maintaining attention. New TED stockings provided due to previous TEDs forming indentations in LEs. Pt exhibits decreased thoroughness with bathing and hesitation with reaching LEs.  At end of session pt was left with family and nursing present.   Therapy Documentation Precautions:  Precautions Precautions: Fall Restrictions Weight Bearing Restrictions: No    Pain: No c/o pain during session    ADL: ADL ADL Comments: Please see functional navigator for ADL levels    See Function Navigator for Current Functional  Status.   Therapy/Group: Individual Therapy  [name] A Allannah Kempen 07/11/2016, 7:57 PM

## 2016-07-11 NOTE — Progress Notes (Signed)
Occupational Therapy Session Note  Patient Details  Name: MAK SNEERINGER MRN: [id number] Date of Birth: 07/02/1941  Today's Date: 07/11/2016 OT Individual Time: [id number] OT Individual Time Calculation (min): 30 min     Skilled Therapeutic Interventions/Progress Updates:   patient received sitting upright in bed in gown.  Offered to help patient dress, but he stated the blue jeans in his room were not comfortable and that his wife would come in later with his more comfortable pants.  He preferred to wait for the more comfortable clothing.    He concurred to complete the following:   Oral care with set up;         Self ROM exercises with moderate assistance as patient needed assistance for technique.    Patient was left sitting supine with head of bed elevated and call bell and alarm in place.  Therapy Documentation Precautions:  Precautions Precautions: Fall Restrictions Weight Bearing Restrictions: No  Pain:denied   See Function Navigator for Current Functional Status.   Therapy/Group: Individual Therapy  Alfredia Ferguson [facility] 07/11/2016, 3:48 PM

## 2016-07-12 ENCOUNTER — Inpatient Hospital Stay (HOSPITAL_COMMUNITY): Payer: Medicare Other | Admitting: Physical Therapy

## 2016-07-12 NOTE — Progress Notes (Signed)
Nathan Frank is a 75 y.o. male 1941-02-20 [id number]  Subjective: No new complaints. No new problems. Slept well. Feeling OK.  Objective: Vital signs in last 24 hours: Temp:  [98.1 F (36.7 C)-98.3 F (36.8 C)] 98.3 F (36.8 C) (10/08 0525) Pulse Rate:  [74-118] 118 (10/08 0900) Resp:  [18] 18 (10/08 0525) BP: (97-125)/(68-88) 110/69 (10/08 0933) SpO2:  [100 %] 100 % (10/08 0525) Weight:  [199 lb (90.3 kg)] 199 lb (90.3 kg) (10/08 0525) Weight change: -14 lb 3 oz (-6.434 kg) Last BM Date: 07/11/16  Intake/Output from previous day: 10/07 0701 - 10/08 0700 In: 600 [P.O.:600] Out: 1300 [Urine:1300] Last cbgs: CBG (last 3)  No results for input(s): GLUCAP in the last 72 hours.   Physical Exam General: No apparent distress   HEENT: not dry Lungs: Normal effort. Lungs clear to auscultation, no crackles or wheezes. Cardiovascular: Regular rate and rhythm, trace edema Abdomen: S/NT/ND; BS(+) Musculoskeletal:  unchanged Neurological: No new neurological deficits Wounds: N/A    Skin: clear  Aging changes Mental state: Alert, oriented, cooperative    Lab Results: BMET    Component Value Date/Time   NA 141 07/11/2016 0522   K 3.2 (L) 07/11/2016 0522   CL 109 07/11/2016 0522   CO2 22 07/11/2016 0522   GLUCOSE 82 07/11/2016 0522   BUN 8 07/11/2016 0522   CREATININE 1.36 (H) 07/11/2016 0522   CREATININE 0.72 12/13/2015 1226   CALCIUM 8.2 (L) 07/11/2016 0522   GFRNONAA 49 (L) 07/11/2016 0522   GFRAA 57 (L) 07/11/2016 0522   CBC    Component Value Date/Time   WBC 4.2 07/09/2016 0951   RBC 3.07 (L) 07/09/2016 0951   HGB 9.8 (L) 07/09/2016 0951   HCT 29.2 (L) 07/09/2016 0951   PLT 247 07/09/2016 0951   MCV 95.1 07/09/2016 0951   MCH 31.9 07/09/2016 0951   MCHC 33.6 07/09/2016 0951   RDW 17.1 (H) 07/09/2016 0951   LYMPHSABS 0.8 07/09/2016 0951   MONOABS 0.5 07/09/2016 0951   EOSABS 0.1 07/09/2016 0951   BASOSABS 0.0 07/09/2016 0951    Studies/Results: No  results found.  Medications: I have reviewed the patient's current medications.  Assessment/Plan:   1. 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 2. DVT Prophylaxis/Anticoagulation:   Lovenox 3. Pain Management: tylenol prn. Support limbs as needed to maintain joint position and prevent contracture 4. Mood:  5. Neuropsych: This patient is not yetcapable of making decisions on hisown behalf. 6. Skin/Wound Care: encourage nutrition and pressure relief as needed 7. Fluids/Electrolytes/Nutrition: encourage PO. He has persistent lower extremity edema. Basic Metabolic Panel: Labs(Brief)          Component Value Date/Time   NA 141 07/11/2016 0522   K 3.2 (L) 07/11/2016 0522   CL 109 07/11/2016 0522   CO2 22 07/11/2016 0522   BUN 8 07/11/2016 0522   CREATININE 1.36 (H) 07/11/2016 0522   CREATININE 0.72 12/13/2015 1226   GLUCOSE 82 07/11/2016 0522   CALCIUM 8.2 (L) 07/11/2016 0522      8. Afib: ASA daily -metoprolol increased to 50mg  BID for better rate control     Vitals:   07/11/16 0518 07/11/16 0843  BP: 122/81 121/76  Pulse: 86 79  Resp: 18   Temp: 98.2 F (36.8 C)    -On my exam rate is reasonably controlled. 9. AKI -continue to push pos             -.  RecentLabs       Lab Results  Component Value Date   CREATININE 1.36 (H) 07/11/2016   CREATININE 1.45 (H) 07/10/2016   CREATININE 1.61 (H) 07/07/2016      10. Hypokalemia: continue to replete potassium -continue replacement   11. Anemia: Hgb   9.5 on 9/30 likely anemia of chronic dzs 12. Cirrhosis of Liver. - 13. HTN--metoprolol- controlled 14.  CHF ef 40-45%     Length of stay, days: 8  Walker Kehr , MD 07/12/2016, 12:28 PM

## 2016-07-12 NOTE — Progress Notes (Signed)
Physical Therapy Session Note  Patient Details  Name: Nathan Frank MRN: [id number] Date of Birth: 1940-11-11  Today's Date: 07/12/2016 PT Individual Time: 0805-0900 PT Individual Time Calculation (min): 55 min    Short Term Goals: Week 1:  PT Short Term Goal 1 (Week 1): Pt will increase bed mobility to mod A.  PT Short Term Goal 2 (Week 1): Pt will increase transfers bed to chair, chair to bed to mod A.  PT Short Term Goal 3 (Week 1): Pt will ambulate about 25 feet with rolling walker and mod A.  PT Short Term Goal 4 (Week 1): Pt ascend/descend 2 stairs with B rails and mod A.  PT Short Term Goal 5 (Week 1): Pt will propel w/c about 50 feet with min A.   Skilled Therapeutic Interventions/Progress Updates:    Pt received in w/c in care of NT. Pt agreeable to tx, denying c/o pain. Pt completed sit<>stand with mod assist overall & total assist to complete donning pants. Therapist donned pt's BLE ted hose & shoes then transported pt room>gym via w/c all total assist for time management. Gait training x 20 ft + 50 ft + 70 ft with RW & mod assist fading to min assist with pt requiring cuing for LLE positioning & occasional assistance with steering RW. Pt demonstrates L toe out, L inattention, decreased step length BLE, & decreased gait speed that slightly improved on 2nd gait trial. Pt demonstrates difficulty ambulating within base of RW especially when turning. Pt requires max cuing to square up to sitting surface & for hand placement when transferring sit<>stand; pt with poor carryover of hand placement for transfers throughout session. Stair training completed x 4 step  (6") with B rails & min assist; pt required max verbal cuing for compensatory pattern. At end of session pt left sitting in w/c in room with BLE on ELR's, QRB donned & all needs within reach.   Pt reported fatigue & required multiple extended rest breaks during PT session.   Therapy Documentation Precautions:   Precautions Precautions: Fall Restrictions Weight Bearing Restrictions: No    See Function Navigator for Current Functional Status.   Therapy/Group: Individual Therapy  [name] 07/12/2016, 10:45 AM

## 2016-07-13 ENCOUNTER — Inpatient Hospital Stay (HOSPITAL_COMMUNITY): Payer: Medicare Other | Admitting: Physical Therapy

## 2016-07-13 ENCOUNTER — Inpatient Hospital Stay (HOSPITAL_COMMUNITY): Payer: Medicare Other | Admitting: Occupational Therapy

## 2016-07-13 ENCOUNTER — Inpatient Hospital Stay (HOSPITAL_COMMUNITY): Payer: Medicare Other | Admitting: Speech Pathology

## 2016-07-13 ENCOUNTER — Inpatient Hospital Stay (HOSPITAL_COMMUNITY): Payer: Self-pay

## 2016-07-13 ENCOUNTER — Inpatient Hospital Stay (HOSPITAL_COMMUNITY): Payer: Self-pay | Admitting: Occupational Therapy

## 2016-07-13 LAB — BASIC METABOLIC PANEL
Anion gap: 12 (ref 5–15)
BUN: 6 mg/dL (ref 6–20)
CALCIUM: 8.5 mg/dL — AB (ref 8.9–10.3)
CO2: 22 mmol/L (ref 22–32)
Chloride: 105 mmol/L (ref 101–111)
Creatinine, Ser: 1.2 mg/dL (ref 0.61–1.24)
GFR calc Af Amer: >60 mL/min (ref >60)
GFR, EST NON AFRICAN AMERICAN: 57 mL/min — AB (ref >60)
GLUCOSE: 119 mg/dL — AB (ref 65–99)
Potassium: 3.9 mmol/L (ref 3.5–5.1)
Sodium: 139 mmol/L (ref 135–145)

## 2016-07-13 MED ORDER — POTASSIUM CHLORIDE CRYS ER 20 MEQ PO TBCR
20.0000 meq | EXTENDED_RELEASE_TABLET | Freq: Three times a day (TID) | ORAL | Status: DC
  Administered 2016-07-13 – 2016-07-29 (×46): 20 meq via ORAL
  Filled 2016-07-13 (×48): qty 1

## 2016-07-13 NOTE — Progress Notes (Signed)
Occupational Therapy Session Note  Patient Details  Name: Nathan Frank MRN: [id number] Date of Birth: 12-21-1940  Today's Date: 07/13/2016 OT Individual Time: 0900-1000 OT Individual Time Calculation (min): 60 min   Short Term Goals: Week 1:  OT Short Term Goal 1 (Week 1): Pt will complete toilet transfer with Max A and 1 helper with LRAD OT Short Term Goal 2 (Week 1): Pt will complete sit<stand with LB dressing and Mod A OT Short Term Goal 3 (Week 1): Pt will self sequence BADL steps with mod vcs OT Short Term Goal 4 (Week 1): Pt will engage in 10 minute thearpeutic activity with 1 rest break to improve activity tolerance   Skilled Therapeutic Interventions/Progress Updates:   ADL-retraining seated at sink in w/c with focus on improved initiation and sequencing, transfers, sit<>stand, static standing balance, and activity tolerance.   Pt received supine in bed, HOB elevated and receptive for BADL session.   With setup to place w/c, extra time, instructional cues, and manual facilitation for hand and foot placement, pt completed bed mobility and transferred to w/c at overall mod assist.   With setup for supplies and clothing, and extra time, pt removed his shirt and progress through upper body bathing and dressing with continuous cues to initiate and progress through sequence, mod assist to pull shirt over his head and down his trunk.   Pt required total assist to don pants, TEDs, and shoes but sustained supported standing at sink with max assist for sit-to-stand and mod assist to maintain upright posture and balance.  Pt deferred bathing periarea and buttocks d/t previous assist from RN tech with change of brief.  Therapy Documentation Precautions:  Precautions Precautions: Fall Restrictions Weight Bearing Restrictions: No  Pain: Pain Assessment Pain Assessment: No/denies pain  ADL: ADL ADL Comments: Please see functional navigator for ADL levels  See Function Navigator for Current  Functional Status.   Therapy/Group: Individual Therapy  [place] 07/13/2016, 12:41 PM

## 2016-07-13 NOTE — Progress Notes (Signed)
Occupational Therapy Session Note  Patient Details  Name: Nathan Frank MRN: [id number] Date of Birth: 09-21-1941  Today's Date: 07/13/2016 OT Individual Time: 1115-1200 OT Individual Time Calculation (min): 45 min     Short Term Goals:Week 1:  OT Short Term Goal 1 (Week 1): Pt will complete toilet transfer with Max A and 1 helper with LRAD OT Short Term Goal 2 (Week 1): Pt will complete sit<stand with LB dressing and Mod A OT Short Term Goal 3 (Week 1): Pt will self sequence BADL steps with mod vcs OT Short Term Goal 4 (Week 1): Pt will engage in 10 minute thearpeutic activity with 1 rest break to improve activity tolerance       Skilled Therapeutic Interventions/Progress Updates:    Pt received in w/c in room with spouse and pt requesting to get in bed right away due to fatigue. With some negotiating, pt agreed to do therapy and then he could return to bed. Session focused on cognition, sit to stand, standing balance, activity tolerance. Pt needed max A to stand from wc to walker and then stand pivot to bed with mod A with RW.  From EOB, pt stood with min-mod A 3x and then on 4th time with Supervision. Pt was moving extremely slowly with very delayed processing. He was giving one word responses.  To stimulate conversation, pt asked about prior career. Pt more engaged in conversation with full sentences but then went back to one word answers.  AROM for UE focusing on speed and pt having great difficulty as movements slow and labored. He stated he hasnt been sleeping at night due to vital sign checks and a light on in the room. Encouraged his spouse to talk to staff about keeping the lights off.  Pt worked on side stepping to the L to walk from end of bed to [facility] with max cues to step L foot out as he was demonstrating inattention to L side.  Pt adjusted in bed and set up so his wife could assist him with lunch. Bed alarm set.  Therapy Documentation Precautions:  Precautions Precautions:  Fall Restrictions Weight Bearing Restrictions: No  Pain: Pain Assessment Pain Assessment: No/denies pain ADL: ADL ADL Comments: Please see functional navigator for ADL levels  See Function Navigator for Current Functional Status.   Therapy/Group: Individual Therapy  Jocelin Schuelke 07/13/2016, 12:41 PM

## 2016-07-13 NOTE — Progress Notes (Signed)
Speech Language Pathology Weekly Progress and Session Note  Patient Details  Name: Nathan Frank MRN: [id number] Date of Birth: 09-Nov-1940  Beginning of progress report period:  July 06, 2016   End of progress report period:  July 13, 2016   Today's Date: 07/13/2016 SLP Individual Time: 1350-1445 SLP Individual Time Calculation (min): 55 min   Short Term Goals: Week 1: SLP Short Term Goal 1 (Week 1): Patient will initiate functional tasks with no more than 2 repetition cues in 50% of given opportunities. SLP Short Term Goal 1 - Progress (Week 1): Progressing toward goal SLP Short Term Goal 2 (Week 1): Patient will utilize compensatory strategies for naming retrieval at the sentence level given Mod A verbal and gesteral cues.  SLP Short Term Goal 2 - Progress (Week 1): Discontinued (comment) (word finding not impacting function during tasks ) SLP Short Term Goal 3 (Week 1): Patient will demonstrate functional problem solving during basic tasks given Mod A verbal and visual cues.  SLP Short Term Goal 3 - Progress (Week 1): Met SLP Short Term Goal 4 (Week 1): Patient will demonstrate intellectual awareness of 2 cognitive and 2 physical deficits given Mod A verbal cues.  SLP Short Term Goal 4 - Progress (Week 1): Progressing toward goal SLP Short Term Goal 5 (Week 1): Patient will consume trials of regular textures and thin liquids via straw, demonstrating efficient mastication and oral clearance with no overt s/s of aspiration. SLP Short Term Goal 5 - Progress (Week 1): Met SLP Short Term Goal 6 (Week 1): Patient will demonstrate 80% intelligibility at the sentence level given Min A verbal cues for overarticulation.  SLP Short Term Goal 6 - Progress (Week 1): Met    New Short Term Goals: Week 2: SLP Short Term Goal 1 (Week 2): Patient will initiate functional tasks with no more than 2 repetition cues in 50% of given opportunities. SLP Short Term Goal 2 (Week 2): Patient will  demonstrate functional problem solving during basic tasks given Min A verbal and visual cues.  SLP Short Term Goal 3 (Week 2): Patient will demonstrate intellectual awareness of 2 cognitive and 2 physical deficits given Mod A verbal cues.  SLP Short Term Goal 4 (Week 2): Patient will consume regular textures and thin liquids via straw during a meal, demonstrating efficient mastication and oral clearance with no overt s/s of aspiration. SLP Short Term Goal 5 (Week 2): Pt will utilize external aids to facilitate recall of daily information with min assist.   Weekly Progress Updates:  Pt has made slow functional gains this reporting period and has met 3 out of 5 targeted short term goals.  Goals for word finding discontinued due to language not impacting function during tasks.  Pt is consuming dys 3 textures and thin liquids with intermittent supervision for use of swallowing precautions and is making good progress towards diet progression.  Pt currently requires mod assist for basic tasks due to moderate cognitive impairment characterized by decreased attention to tasks, decreased working memory, decreased awareness of deficits, and decreased task initiation, all of which impact his functional problem solving.  Pt would continue to benefit from skilled ST while inpatient in order to maximize functional independence and reduce burden of care prior to discharge.  No family education has been completed at this time due to inconsistent family attendance in therapy sessions.  Recommend 24/7 supervision at discharge in addition to [place] follow up at next level of care.     Intensity:  Minumum of 1-2 x/day, 30 to 90 minutes Frequency: 3 to 5 out of 7 days Duration/Length of Stay: 20-24 days Treatment/Interventions: Cognitive remediation/compensation;Cueing hierarchy;Dysphagia/aspiration precaution training;Environmental controls;Functional tasks;Internal/external aids;Medication managment;Patient/family  education;Speech/Language facilitation;Therapeutic Activities   Daily Session  Skilled Therapeutic Interventions: Pt was seen for skilled ST targeting cognitive goals.  SLP facilitated the session with medication management subtests from [facility] standardized cognitive assessment.  Pt required mod assist for functional problem solving during task due to decreased working memory and decreased attention to tasks.  Discussed recommendation that pt have assistance for medication management at discharge.  Pt reports that his daughter and grandson will be able to help at discharge.  Pt was returned to room and transferred back to bed with max assist multimodal cues for initiation of transfer.  Pt was left in bed with bed alarm set and wife at bedside. Goals updated on this date to reflect current progress and plan of care.         Function:   Eating Eating                 Cognition Comprehension Comprehension assist level: Understands basic 90% of the time/cues < 10% of the time  Expression   Expression assist level: Expresses basic 90% of the time/requires cueing < 10% of the time.  Social Interaction Social Interaction assist level: Interacts appropriately 75 - 89% of the time - Needs redirection for appropriate language or to initiate interaction.  Problem Solving Problem solving assist level: Solves basic 75 - 89% of the time/requires cueing 10 - 24% of the time  Memory Memory assist level: Recognizes or recalls 50 - 74% of the time/requires cueing 25 - 49% of the time   General    Pain Pain Assessment Pain Assessment: No/denies pain  Therapy/Group: Individual Therapy  Takeila Thayne, Selinda Orion 07/13/2016, 4:01 PM

## 2016-07-13 NOTE — Progress Notes (Signed)
Occupational Therapy Weekly Progress Note  Patient Details  Name: Nathan Frank MRN: [id number] Date of Birth: January 08, 1941  Beginning of progress report period: 07/05/16 End of progress report period: 07/13/16  Patient has met 3 of 4 short term goals.   Patient continues to demonstrate the following deficits: cognition, hemiparesis, balance, and activity tolerance and therefore will continue to benefit from skilled OT intervention to enhance overall performance with BADL.  Nathan Frank has improved overall balance and standing abilities during self care completion since time of evaluation. At time of eval pt required 2 helpers for functional transfers and standing, where pt can now complete toilet transfers with Mod-Max A and stand with Mod A-supervision. Pt still requires Max A for self care completion overall due to profound apraxia and cognitive deficits.Skilled OT will continue to address aforementioned deficits to maximize pts independence with BADLs. Continue with POC at this time.   Patient progressing toward long term goals..  Continue plan of care.  OT Short Term Goals Week 2:  OT Short Term Goal 1 (Week 2): Pt will complete toilet transfer with Min A OT Short Term Goal 2 (Week 2): Pt will complete UB dressing with Min A OT Short Term Goal 3 (Week 2): Pt will sequence 4/10 steps during bathing with mod vcs OT Short Term Goal 4 (Week 2): Pt will complete LB dressing with Mod A  Therapy Documentation Precautions:  Precautions Precautions: Fall Restrictions Weight Bearing Restrictions: No General:   Vital Signs: Therapy Vitals Temp: 98.6 F (37 C) Temp Source: Oral Pulse Rate: 98 Resp: 18 BP: 124/85 Patient Position (if appropriate): Lying Oxygen Therapy SpO2: 99 % O2 Device: Not Delivered Pain: Pain Assessment Pain Assessment: No/denies pain ADL: ADL ADL Comments: Please see functional navigator for ADL levels    See Function Navigator for Current Functional  Status.   Therapy/Group: Individual Therapy  Nathan Frank 07/13/2016, 6:40 PM

## 2016-07-13 NOTE — Progress Notes (Signed)
Physical Therapy Weekly Progress Note  Patient Details  Name: Nathan Frank MRN: [id number] Date of Birth: [birth year]  Beginning of progress report period: July 05, 2016 End of progress report period: July 13, 2016  Today's Date: 07/13/2016 PT Individual Time: 1004-1100 PT Individual Time Calculation (min): 56 min    Patient has met 5 of 5 short term goals.  Pt has made some progress with therapy this week but continues to demonstrate deficits with initiation and attention which are limiting his progress with mobility.    Patient continues to demonstrate the following deficits: strength, balance, initiation, attention, awareness, and motor planning and therefore will continue to benefit from skilled PT intervention to enhance overall performance with activity tolerance, balance, postural control, attention, awareness and coordination.  Patient progressing toward long term goals..  Continue plan of care.  PT Short Term Goals Week 1:  PT Short Term Goal 1 (Week 1): Pt will increase bed mobility to mod A.  PT Short Term Goal 1 - Progress (Week 1): Met PT Short Term Goal 2 (Week 1): Pt will increase transfers bed to chair, chair to bed to mod A.  PT Short Term Goal 2 - Progress (Week 1): Met PT Short Term Goal 3 (Week 1): Pt will ambulate about 25 feet with rolling walker and mod A.  PT Short Term Goal 3 - Progress (Week 1): Met PT Short Term Goal 4 (Week 1): Pt ascend/descend 2 stairs with B rails and mod A.  PT Short Term Goal 4 - Progress (Week 1): Met PT Short Term Goal 5 (Week 1): Pt will propel w/c about 50 feet with min A.  PT Short Term Goal 5 - Progress (Week 1): Met Week 2:  PT Short Term Goal 1 (Week 2): Pt will propel w/c 100' with supervision  PT Short Term Goal 2 (Week 2): Pt will transfer with min assist and LRAD PT Short Term Goal 3 (Week 2): Pt will ambulate 100' with LRAD and min assist PT Short Term Goal 4 (Week 2): Pt will initiate sit<>stand with min verbal cues  in 50% of observed trials.    Skilled Therapeutic Interventions/Progress Updates:    Pt resting in w/c upon arrival, no c/o pain but reporting need to have bowel movement.  Mod assist for sit>stand from w/c with RW with mod multimodal cues and more than a reasonable amount of time to initiate movement.  Ambulatory transfer into bathroom with min assist and mod cues for walker positioning and squaring up to toilet before sitting. Max assist for clothing management, (-) BM.  Pt requiring max cues to assist with and sequence clothing management.  Pt returned to w/c with min assist for sit>stand from elevated toilet and for gait to w/c.  PT transported to therapy gym total assist for time management.  PT instructed pt in w/c parts management with max multimodal cues and more than a reasonable amount of time to lock chair and remove leg rests.  Attempted sit<>stand but pt unable to initiate movement.  Pt returned to room at end of session and left upright in w/c with call bell in reach and needs met.   Therapy Documentation Precautions:  Precautions Precautions: Fall Restrictions Weight Bearing Restrictions: No   See Function Navigator for Current Functional Status.  Therapy/Group: Individual Therapy  Earnest Conroy Penven-Crew 07/13/2016, 12:23 PM

## 2016-07-13 NOTE — Progress Notes (Signed)
[facility]ILITATION     PROGRESS NOTE    Subjective/Complaints: Patient without new issues overnight. Feeling well.  ROS: Pt denies fever, rash/itching, headache, blurred or double vision, nausea, vomiting, abdominal pain, diarrhea, chest pain, shortness of breath, palpitations, dysuria, dizziness, neck or back pain, bleeding, anxiety, or depression   Objective: Vital Signs: Blood pressure 122/89, pulse (!) 108, temperature 98.4 F (36.9 C), temperature source Oral, resp. rate 18, height 6' (1.829 m), weight 90.8 kg (200 lb 1.6 oz), SpO2 95 %. No results found. No results for input(s): WBC, HGB, HCT, PLT in the last 72 hours.  Recent Labs  07/11/16 0522  NA 141  K 3.2*  CL 109  GLUCOSE 82  BUN 8  CREATININE 1.36*  CALCIUM 8.2*   CBG (last 3)  No results for input(s): GLUCAP in the last 72 hours.  Wt Readings from Last 3 Encounters:  07/13/16 90.8 kg (200 lb 1.6 oz)  06/30/16 103.8 kg (228 lb 14.4 oz)  06/16/16 92.6 kg (204 lb 3.2 oz)    Physical Exam:  Constitutional: He is oriented to person, place, and time. He appears well-developed and well-nourished.  HENT:  Head: Normocephalic and atraumatic.  Eyes: Conjunctivae are normal. Pupils are equal, round, and reactive to light.  Neck: Normal range of motion. Neck supple.  Cardiovascular: Normal rate.  An irregular rhythm present. Exam reveals no gallop and no friction rub.   No murmur heard. Respiratory: Effort normal. No stridor. No respiratory distress. He has no wheezes. He has rhonchi. He has no rales.  GI: Soft. Bowel sounds are increased. There is no tenderness. There is no rebound and no guarding.  Mild distention  Ext: 2+ edema BLE Neurological: He is alert and oriented to person, place, and time.  Left facial weakness with minimal dysarthria and occasional wet voice. Flat affect with delayed processing and slow movements.Fair insight and awareness. He is able to follow simple one  and two step motor commands.  Left sided weakness: LUW 2/5 deltoid, bicep, tricep, wrist, hand, 1+ to 2-/5 HF, KE, ADF/PF. Senses pain Left arm and leg.   Skin: Skin is warm and dry. No rash noted.  BLE with dry flaky skin.  Psychiatric: His affect is blunt. His speech is delayed. He is slowed.   Assessment/Plan: 1. 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 Physiatrist is providing close team supervision and 24 hour management of active medical problems listed below. Physiatrist and rehab team continue to assess barriers to discharge/monitor patient progress toward functional and medical goals.  Function:  Bathing Bathing position   Position: Wheelchair/chair at sink  Bathing parts Body parts bathed by patient: Left arm, Right arm, Chest, Abdomen, Right upper leg, Left upper leg Body parts bathed by helper: Right lower leg, Left lower leg, Back  Bathing assist Assist Level:  (Mod A)      Upper Body Dressing/Undressing Upper body dressing   What is the patient wearing?: Pull over shirt/dress     Pull over shirt/dress - Perfomed by patient: Put head through opening Pull over shirt/dress - Perfomed by helper: Thread/unthread right sleeve, Thread/unthread left sleeve, Pull shirt over trunk        Upper body assist Assist Level:  (Max A)      Lower Body Dressing/Undressing Lower body dressing   What is the patient wearing?: Pants, Non-skid slipper socks,  Ted Hose   Underwear - Performed by helper: Thread/unthread right underwear leg, Thread/unthread left underwear leg, Pull underwear up/down   Pants- Performed by helper: Thread/unthread right pants leg, Thread/unthread left pants leg, Pull pants up/down   Non-skid slipper socks- Performed by helper: Don/doff right sock, Don/doff left sock   Socks - Performed by helper: Don/doff right sock,  Don/doff left sock   Shoes - Performed by helper: Don/doff right shoe, Don/doff left shoe, Fasten right, Fasten left       TED Hose - Performed by helper: Don/doff right TED hose, Don/doff left TED hose  Lower body assist Assist for lower body dressing:  (Total A)      Toileting Toileting Toileting activity did not occur: No continent bowel/bladder event   Toileting steps completed by helper: Adjust clothing prior to toileting, Performs perineal hygiene, Adjust clothing after toileting Toileting Assistive Devices: Grab bar or rail  Toileting assist Assist level:  (total assist)   Transfers Chair/bed transfer   Chair/bed transfer method: Ambulatory Chair/bed transfer assist level: Moderate assist (Pt 50 - 74%/lift or lower) (to lift, min assist for gait) Chair/bed transfer assistive device: Armrests, Environmental manager lift: Ecologist     Max distance: 70 ft Assist level: Touching or steadying assistance (Pt > 75%)   Wheelchair   Type: Manual Max wheelchair distance: 60 Assist Level: Touching or steadying assistance (Pt > 75%)  Cognition Comprehension Comprehension assist level: Understands basic 90% of the time/cues < 10% of the time  Expression Expression assist level: Expresses basic 90% of the time/requires cueing < 10% of the time.  Social Interaction Social Interaction assist level: Interacts appropriately 90% of the time - Needs monitoring or encouragement for participation or interaction.  Problem Solving Problem solving assist level: Solves basic 90% of the time/requires cueing < 10% of the time  Memory Memory assist level: Recognizes or recalls 90% of the time/requires cueing < 10% of the time   Medical Problem List and Plan: 1.  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 2.  DVT Prophylaxis/Anticoagulation:   Lovenox 3. Pain Management: tylenol prn. Support limbs as needed to maintain joint position and prevent contracture 4. Mood: team to follow for ego support. SW to assess upon admit.  5. Neuropsych: This patient is not yet capable of making decisions on his own behalf. 6. Skin/Wound Care: encourage nutrition and pressure relief as needed 7. Fluids/Electrolytes/Nutrition: encourage PO. IV fluids d/ced due to peripheral edema 8. Afib: ASA daily             -metoprolol increased to 73m BID for better rate control Vitals:   07/12/16 2052 07/13/16 0516  BP: 103/73 122/89  Pulse: (!) 114 (!) 108  Resp:  18  Temp:  98.4 F (36.9 C)               -no evidence of bradycardia             -adjust regimen as needed 9. AKI: Creatinine looks to be at a baseline             -continue to push pos  -. Has hypokalemia as well, likely due to diuretics, potassium supplementation was increased as of 07/10/2016, repeat be met              -  has been on spironolactone and potassium and Lasix at home. We will continue these medications. Increase task see him, increased Lasix to 40 mg twice a day Will recheck basic metabolic panel. 11. Anemia:  Hgb 14.1 at admission down to --->8.5. 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             -monitor LFT's and ammonia as needed                    -no evidence of encephalopathy. At the current time 59. HTN--metoprolol 14.  CHF ef 40-45% monitor for pulm edema,CXR, showing pulmonary edema as well as bibasilar atelectasis. No clear-cut infiltrate as of 07/09/2016  LOS (Days) 9 A FACE TO FACE EVALUATION WAS PERFORMED  Charlett Blake 07/13/2016 9:39 AM

## 2016-07-14 ENCOUNTER — Inpatient Hospital Stay (HOSPITAL_COMMUNITY): Payer: Medicare Other | Admitting: Speech Pathology

## 2016-07-14 ENCOUNTER — Inpatient Hospital Stay (HOSPITAL_COMMUNITY): Payer: Medicare Other | Admitting: Physical Therapy

## 2016-07-14 ENCOUNTER — Inpatient Hospital Stay (HOSPITAL_COMMUNITY): Payer: Medicare Other | Admitting: Occupational Therapy

## 2016-07-14 NOTE — Progress Notes (Signed)
[place] [facility]ICINE & REHABILITATION     PROGRESS NOTE    Subjective/Complaints: Patient without new issues overnight. Feeling well.  ROS: Pt denies fever, rash/itching, headache, blurred or double vision, nausea, vomiting, abdominal pain, diarrhea, chest pain, shortness of breath, palpitations, dysuria, dizziness, neck or back pain, bleeding, anxiety, or depression   Objective: Vital Signs: Blood pressure 112/74, pulse 96, temperature 98 F (36.7 C), temperature source Oral, resp. rate 18, height 6' (1.829 m), weight 83.9 kg (184 lb 14.4 oz), SpO2 99 %. No results found. No results for input(s): WBC, HGB, HCT, PLT in the last 72 hours.  Recent Labs  07/13/16 1219  NA 139  K 3.9  CL 105  GLUCOSE 119*  BUN 6  CREATININE 1.20  CALCIUM 8.5*   CBG (last 3)  No results for input(s): GLUCAP in the last 72 hours.  Wt Readings from Last 3 Encounters:  07/14/16 83.9 kg (184 lb 14.4 oz)  06/30/16 103.8 kg (228 lb 14.4 oz)  06/16/16 92.6 kg (204 lb 3.2 oz)    Physical Exam:  Constitutional: He is oriented to person, place, and time. He appears well-developed and well-nourished.  HENT:  Head: Normocephalic and atraumatic.  Eyes: Conjunctivae are normal. Pupils are equal, round, and reactive to light.  Neck: Normal range of motion. Neck supple.  Cardiovascular: Normal rate.  An irregular rhythm present. Exam reveals no gallop and no friction rub.   No murmur heard. Respiratory: Effort normal. No stridor. No respiratory distress. He has no wheezes. He has rhonchi. He has no rales.  GI: Soft. Bowel sounds are increased. There is no tenderness. There is no rebound and no guarding.  Mild distention  Ext: 2+ edema BLE Neurological: He is alert and oriented to person, place, and time.  Left facial weakness with minimal dysarthria and occasional wet voice. Flat affect with delayed processing and slow movements.Fair insight and awareness. He is able to follow simple one and  two step motor commands.  Left sided weakness: LUW 2/5 deltoid, bicep, tricep, wrist, hand, 1+ to 2-/5 HF, KE, ADF/PF. Senses pain Left arm and leg.   Skin: Skin is warm and dry. No rash noted.  BLE with dry flaky skin.  Psychiatric: His affect is blunt. His speech is delayed. He is slowed.   Assessment/Plan: 1. 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 Physiatrist is providing close team supervision and 24 hour management of active medical problems listed below. Physiatrist and rehab team continue to assess barriers to discharge/monitor patient progress toward functional and medical goals.  Function:  Bathing Bathing position   Position: Wheelchair/chair at sink  Bathing parts Body parts bathed by patient: Right arm, Left arm, Chest, Abdomen, Buttocks Body parts bathed by helper: Left lower leg, Right lower leg, Back  Bathing assist Assist Level:  (Mod Assist)      Upper Body Dressing/Undressing Upper body dressing   What is the patient wearing?: Pull over shirt/dress     Pull over shirt/dress - Perfomed by patient: Put head through opening, Pull shirt over trunk Pull over shirt/dress - Perfomed by helper: Thread/unthread right sleeve, Thread/unthread left sleeve        Upper body assist Assist Level:  (Mod assist)      Lower Body Dressing/Undressing Lower body dressing   What is the patient wearing?: Pants, [employer], Shoes   Underwear - Performed by  helper: Thread/unthread right underwear leg, Thread/unthread left underwear leg, Pull underwear up/down   Pants- Performed by helper: Thread/unthread right pants leg, Thread/unthread left pants leg, Pull pants up/down   Non-skid slipper socks- Performed by helper: Don/doff right sock, Don/doff left sock   Socks - Performed by helper: Don/doff right sock, Don/doff left sock   Shoes -  Performed by helper: Don/doff right shoe, Don/doff left shoe, Fasten right, Fasten left       TED Hose - Performed by helper: Don/doff right TED hose, Don/doff left TED hose  Lower body assist Assist for lower body dressing:  (Total assist)      Toileting Toileting Toileting activity did not occur: No continent bowel/bladder event Toileting steps completed by patient: Performs perineal hygiene Toileting steps completed by helper: Adjust clothing prior to toileting, Adjust clothing after toileting Toileting Assistive Devices: Grab bar or rail  Toileting assist Assist level:  (Max assist)   Transfers Chair/bed transfer   Chair/bed transfer method: Stand pivot Chair/bed transfer assist level: Maximal assist (Pt 25 - 49%/lift and lower) Chair/bed transfer assistive device: Armrests, Environmental manager lift: Ecologist     Max distance: 70 ft Assist level: Touching or steadying assistance (Pt > 75%)   Wheelchair   Type: Manual Max wheelchair distance: 60 Assist Level: Touching or steadying assistance (Pt > 75%)  Cognition Comprehension Comprehension assist level: Understands basic 90% of the time/cues < 10% of the time  Expression Expression assist level: Expresses basic 90% of the time/requires cueing < 10% of the time.  Social Interaction Social Interaction assist level: Interacts appropriately 75 - 89% of the time - Needs redirection for appropriate language or to initiate interaction.  Problem Solving Problem solving assist level: Solves basic 75 - 89% of the time/requires cueing 10 - 24% of the time  Memory Memory assist level: Recognizes or recalls 50 - 74% of the time/requires cueing 25 - 49% of the time   Medical Problem List and Plan: 1.  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 2.  DVT Prophylaxis/Anticoagulation:   Lovenox 3. Pain Management: tylenol prn. Support limbs as needed to maintain joint position and prevent contracture 4. Mood: team to follow for ego support. SW to assess upon admit.  5. Neuropsych: This patient is not yet capable of making decisions on his own behalf. 6. Skin/Wound Care: encourage nutrition and pressure relief as needed 7. Fluids/Electrolytes/Nutrition: encourage PO. 8. Afib: ASA daily             -metoprolol increased to 75m BID for better rate, would not increased further due to lower blood pressures  Vitals:   07/14/16 0811 07/14/16 1602  BP: 100/75 112/74  Pulse: (!) 113 96  Resp:  18  Temp:  98 F (36.7 C)               Mild tachycardia this morning, generally running in the 90 range             -adjust regimen as needed 9. AKI: Creatinine looks to be at a baseline             -continue to push pos  -. Has hypokalemia as well, likely due to diuretics, potassium supplementation was increased as of 07/10/2016, repeat be met              -  has been on spironolactone and potassium and Lasix at home. We will continue these medications. Increase task see him, increased Lasix to 40 mg twice a day . Normal recheck basic metabolic panel on 28/76. 11. Anemia:  Hgb 14.1 at admission down to --->8.5. 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             -monitor LFT's and ammonia as needed                    -no evidence of encephalopathy. At the current time 22. HTN--metoprolol 14.  CHF ef 40-45% monitor for pulm edema,CXR, showing pulmonary edema as well as bibasilar atelectasis. No clear-cut infiltrate as of 07/09/2016  LOS (Days) 10 A FACE TO FACE EVALUATION WAS PERFORMED  Nathan Frank 07/14/2016 5:33 PM

## 2016-07-14 NOTE — Progress Notes (Signed)
Patient has been refusing both his Carafate and K+. He said both meds giving him diarrhea.

## 2016-07-14 NOTE — Progress Notes (Signed)
Occupational Therapy Session Note  Patient Details  Name: Nathan Frank MRN: [id number] Date of Birth: 1941/05/07  Today's Date: 07/14/2016 OT Individual Time: [id number] Individual Time Calculation (min): 60 min     Short Term Goals:Week 2:  OT Short Term Goal 1 (Week 2): Pt will complete toilet transfer with Min A OT Short Term Goal 2 (Week 2): Pt will complete UB dressing with Min A OT Short Term Goal 3 (Week 2): Pt will sequence 4/10 steps during bathing with mod vcs OT Short Term Goal 4 (Week 2): Pt will complete LB dressing with Mod A  Skilled Therapeutic Interventions/Progress Updates:    Pt initially refusing therapy due to fatigue and requesting to sleep longer, notified scheduling to allow later start, and returned at later time - therefore pt missing 15 mins.  Treatment session with focus on functional transfers, initiation, orientation, and sequencing during self-care tasks.  Pt with improved initiation with bed mobility and sit > stand throughout session, still requiring min assist (tactile and verbal cues) for sequencing and hand placement to increase independence and safety with sit  > stand and transfers.  Pt reported need to toilet.  Performed squat pivot transfer bed > w/c > toilet with min assist.  Pt completed hygiene with leaning and then standing for thoroughness with therapist providing min assist for standing balance.  UB dressing completed in sitting at sink with max cues for problem solving, orientation, and sequencing when donning shirt.  Continues to require max-total assist with LB dressing as pt with decreased initiation, problem solving, and orientation of clothing.  Left seated in w/c with quick release belt on and all needs in reach.  Therapy Documentation Precautions:  Precautions Precautions: Fall Restrictions Weight Bearing Restrictions: No General:   Vital Signs: Therapy Vitals Pulse Rate: (!) 113 BP: 100/75 Pain:  Pt with no c/o pain  See  Function Navigator for Current Functional Status.   Therapy/Group: Individual Therapy  Simonne Come 07/14/2016, 9:17 AM

## 2016-07-14 NOTE — Progress Notes (Signed)
Speech Language Pathology Daily Session Note  Patient Details  Name: Nathan Frank MRN: [id number] Date of Birth: 28-Mar-1941  Today's Date: 07/14/2016 SLP Individual Time: 1305-1405 SLP Individual Time Calculation (min): 60 min   Short Term Goals: Week 2: SLP Short Term Goal 1 (Week 2): Patient will initiate functional tasks with no more than 2 repetition cues in 50% of given opportunities. SLP Short Term Goal 2 (Week 2): Patient will demonstrate functional problem solving during basic tasks given Min A verbal and visual cues.  SLP Short Term Goal 3 (Week 2): Patient will demonstrate intellectual awareness of 2 cognitive and 2 physical deficits given Mod A verbal cues.  SLP Short Term Goal 4 (Week 2): Patient will consume regular textures and thin liquids via straw during a meal, demonstrating efficient mastication and oral clearance with no overt s/s of aspiration. SLP Short Term Goal 5 (Week 2): Pt will utilize external aids to facilitate recall of daily information with min assist.   Skilled Therapeutic Interventions: Pt was seen for skilled ST targeting cognitive goals. Pt was eating lunch upon arrival and was able to selectively attend to task in a mildly distracting environment with supervision verbal cues for ~15 min.  Pt required >5 minutes and max assist multimodal cues for initiation and sequencing of transfer out of bed to wheelchair.  Pt needed mod assist for working memory and task initiation to locate information about a personally relevant topic from the newspaper.  Pt was returned to bed at the end of therapy session with call bell within reach and bed alarm set.  Continue per current plan of care.    Function:  Eating Eating   Modified Consistency Diet: Yes Eating Assist Level: Supervision or verbal cues;Set up assist for   Eating Set Up Assist For: Opening containers       Cognition Comprehension Comprehension assist level: Follows basic conversation/direction with  extra time/assistive device  Expression   Expression assist level: Expresses basic needs/ideas: With extra time/assistive device  Social Interaction Social Interaction assist level: Interacts appropriately 90% of the time - Needs monitoring or encouragement for participation or interaction.  Problem Solving Problem solving assist level: Solves basic 50 - 74% of the time/requires cueing 25 - 49% of the time  Memory Memory assist level: Recognizes or recalls 50 - 74% of the time/requires cueing 25 - 49% of the time    Pain Pain Assessment Pain Assessment: No/denies pain  Therapy/Group: Individual Therapy  Nicola Quesnell, Selinda Orion 07/14/2016, 8:23 PM

## 2016-07-14 NOTE — Progress Notes (Signed)
Physical Therapy Session Note  Patient Details  Name: Nathan Frank MRN: [id number] Date of Birth: 1940/12/02  Today's Date: 07/14/2016 PT Individual Time: 1015-1115 PT Individual Time Calculation (min): 60 min    Short Term Goals: Week 2:  PT Short Term Goal 1 (Week 2): Pt will propel w/c 100' with supervision  PT Short Term Goal 2 (Week 2): Pt will transfer with min assist and LRAD PT Short Term Goal 3 (Week 2): Pt will ambulate 100' with LRAD and min assist PT Short Term Goal 4 (Week 2): Pt will initiate sit<>stand with min verbal cues in 50% of observed trials.   Skilled Therapeutic Interventions/Progress Updates:    Pt resting in w/c on arrival, no c/o pain, and agreeable to therapy session.  Pt requesting to use theraband for belt as his pants will not fasten.  Session focus on initiation, attention to task, and w/c propulsion.  Pt transitioned sit<>stand from w/c x3 throughout session requiring 2-3 minutes each time to initiate transfer, overall min assist with tactile cues for forward weight shift in sitting and to bring weight over feet in standing.  W/C propulsion x60' with overall supervision and intermittent steady assist with more than a reasonable amount of time and max to total verbal cues for sequencing, technique, and attention.  Pt engaged in dynamic standing balance activity 2 trials to fatigue reaching across midline for bean bag toss.  Pt returned to room in w/c at end of session total assist for energy conservation and positioned upright with call bell in reach and needs met.    Therapy Documentation Precautions:  Precautions Precautions: Fall Restrictions Weight Bearing Restrictions: No   See Function Navigator for Current Functional Status.   Therapy/Group: Individual Therapy  Sybrina Laning E Penven-Crew 07/14/2016, 11:17 AM

## 2016-07-15 ENCOUNTER — Inpatient Hospital Stay (HOSPITAL_COMMUNITY): Payer: Medicare Other | Admitting: Speech Pathology

## 2016-07-15 ENCOUNTER — Inpatient Hospital Stay (HOSPITAL_COMMUNITY): Payer: Medicare Other | Admitting: Occupational Therapy

## 2016-07-15 ENCOUNTER — Telehealth: Payer: Self-pay | Admitting: *Deleted

## 2016-07-15 ENCOUNTER — Inpatient Hospital Stay (HOSPITAL_COMMUNITY): Payer: Self-pay | Admitting: Physical Therapy

## 2016-07-15 NOTE — Progress Notes (Addendum)
[place] [facility] PHYSICAL MEDICINE & REHABILITATION     PROGRESS NOTE    Subjective/Complaints: Patient quite happy that his lower extremity edema has resolved  ROS: Pt denies fever, rash/itching, headache, blurred or double vision, nausea, vomiting, abdominal pain, diarrhea, chest pain, shortness of breath, palpitations, dysuria, dizziness, neck or back pain, bleeding, anxiety, or depression   Objective: Vital Signs: Blood pressure (!) 94/55, pulse 96, temperature 98.6 F (37 C), temperature source Oral, resp. rate 19, height 6' (1.829 m), weight 84.1 kg (185 lb 6.5 oz), SpO2 98 %. No results found. No results for input(s): WBC, HGB, HCT, PLT in the last 72 hours.  Recent Labs  07/13/16 1219  NA 139  K 3.9  CL 105  GLUCOSE 119*  BUN 6  CREATININE 1.20  CALCIUM 8.5*   CBG (last 3)  No results for input(s): GLUCAP in the last 72 hours.  Wt Readings from Last 3 Encounters:  07/15/16 84.1 kg (185 lb 6.5 oz)  06/30/16 103.8 kg (228 lb 14.4 oz)  06/16/16 92.6 kg (204 lb 3.2 oz)    Physical Exam:  Constitutional: He is oriented to person, place, and time. He appears well-developed and well-nourished.  HENT:  Head: Normocephalic and atraumatic.  Eyes: Conjunctivae are normal. Pupils are equal, round, and reactive to light.  Neck: Normal range of motion. Neck supple.  Cardiovascular: Normal rate.  An irregular rhythm present. Exam reveals no gallop and no friction rub.   No murmur heard. Respiratory: Effort normal. No stridor. No respiratory distress. He has no wheezes. He has rhonchi. He has no rales.  GI: Soft. Bowel sounds are increased. There is no tenderness. There is no rebound and no guarding.  Mild distention  NWG:NFAOZ edema BLE Neurological: He is alert and oriented to person, place, and time.  Left facial weakness with minimal dysarthria and occasional wet voice. Flat affect with delayed processing and slow movements.Fair insight and awareness. He is able to follow  simple one and two step motor commands.  Left sided weakness: LUW 2/5 deltoid, bicep, tricep, wrist, hand, 1+ to 2-/5 HF, KE, ADF/PF. Senses pain Left arm and leg.   Skin: Skin is warm and dry. No rash noted.  BLE with dry flaky skin.  Psychiatric: His affect is blunt. His speech is delayed. He is slowed.   Assessment/Plan: 1. 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 Physiatrist is providing close team supervision and 24 hour management of active medical problems listed below. Physiatrist and rehab team continue to assess barriers to discharge/monitor patient progress toward functional and medical goals.  Function:  Bathing Bathing position   Position: Wheelchair/chair at sink  Bathing parts Body parts bathed by patient: Right arm, Left arm, Chest, Abdomen, Buttocks Body parts bathed by helper: Left lower leg, Right lower leg, Back  Bathing assist Assist Level:  (Mod Assist)      Upper Body Dressing/Undressing Upper body dressing   What is the patient wearing?: Pull over shirt/dress     Pull over shirt/dress - Perfomed by patient: Put head through opening Pull over shirt/dress - Perfomed by helper: Thread/unthread right sleeve, Thread/unthread left sleeve, Pull shirt over trunk        Upper body assist Assist Level:  (Max assist)      Lower Body Dressing/Undressing Lower body dressing   What is the patient wearing?: Pants, [employer], Shoes   Underwear -  Performed by helper: Thread/unthread right underwear leg, Thread/unthread left underwear leg, Pull underwear up/down   Pants- Performed by helper: Thread/unthread right pants leg, Thread/unthread left pants leg, Pull pants up/down   Non-skid slipper socks- Performed by helper: Don/doff right sock, Don/doff left sock   Socks - Performed by helper: Don/doff right sock, Don/doff left sock    Shoes - Performed by helper: Don/doff right shoe, Don/doff left shoe, Fasten right, Fasten left       TED Hose - Performed by helper: Don/doff right TED hose, Don/doff left TED hose  Lower body assist Assist for lower body dressing:  (Total assist)      Toileting Toileting Toileting activity did not occur: No continent bowel/bladder event Toileting steps completed by patient: Performs perineal hygiene Toileting steps completed by helper: Adjust clothing prior to toileting, Adjust clothing after toileting Toileting Assistive Devices: Grab bar or rail  Toileting assist Assist level:  (Max assist)   Transfers Chair/bed transfer   Chair/bed transfer method: Stand pivot Chair/bed transfer assist level: Moderate assist (Pt 50 - 74%/lift or lower) Chair/bed transfer assistive device: Armrests, Walker Mechanical lift: Ecologist     Max distance: 70 ft Assist level: Touching or steadying assistance (Pt > 75%)   Wheelchair   Type: Manual Max wheelchair distance: 60 Assist Level: Touching or steadying assistance (Pt > 75%)  Cognition Comprehension Comprehension assist level: Understands basic 90% of the time/cues < 10% of the time  Expression Expression assist level: Expresses basic 90% of the time/requires cueing < 10% of the time.  Social Interaction Social Interaction assist level: Interacts appropriately 75 - 89% of the time - Needs redirection for appropriate language or to initiate interaction.  Problem Solving Problem solving assist level: Solves basic 75 - 89% of the time/requires cueing 10 - 24% of the time  Memory Memory assist level: Recognizes or recalls 50 - 74% of the time/requires cueing 25 - 49% of the time   Medical Problem List and Plan: 1.  Left hemiparesis and funtctional deficits secondary to right MCA embolic infarct  -Continue CIR PT, OT, Speech therapy, Team conference today please see physician documentation under team conference tab, met  with team face-to-face to discuss problems,progress, and goals. Formulized individual treatment plan based on medical history, underlying problem and comorbidities.2.  DVT Prophylaxis/Anticoagulation:   Lovenox 3. Pain Management: tylenol prn. Support limbs as needed to maintain joint position and prevent contracture 4. Mood: team to follow for ego support. SW to assess upon admit.  5. Neuropsych: This patient is not yet capable of making decisions on his own behalf. 6. Skin/Wound Care: encourage nutrition and pressure relief as needed 7. Fluids/Electrolytes/Nutrition: encourage PO. 8. Afib: ASA daily             -metoprolol increased to 9m BID for better rate,  Vitals:   07/14/16 1946 07/15/16 0524  BP: 108/75 (!) 94/55  Pulse: (!) 116 96  Resp:  19  Temp:  98.6 F (37 C)               Mild tachycardia  generally running in the 90 range, no increase in beta blockers due to soft BPs             -adjust regimen as needed 9. AKI: Creatinine looks to be at a baseline             -continue to push pos  -. Has hypokalemia as well, likely due to diuretics, potassium supplementation  was increased as of 07/10/2016, repeat be met              - has been on spironolactone and potassium and Lasix at home. We will continue these medications. Increase task see him, increased Lasix to 40 mg twice a day . Normal recheck basic metabolic panel on 67/67. 11. Anemia:  Hgb 14.1 at admission down to --->8.5. 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             -monitor LFT's and ammonia as needed                    -no evidence of encephalopathy. At the current time 85. HTN--metoprolol 14.  CHF ef 40-45% monitor for pulm edema,CXR, showing pulmonary edema as well as bibasilar atelectasis. No clear-cut infiltrate as of 07/09/2016, lower extremity edema resolved on  diuretics. Continue Lasix 40 mg twice a day and spironolactone  LOS (Days) 11 A FACE TO FACE EVALUATION WAS PERFORMED  Alysia Penna E 07/15/2016 9:46 AM

## 2016-07-15 NOTE — Progress Notes (Signed)
Occupational Therapy Session Note  Patient Details  Name: Nathan Frank MRN: [id number] Date of Birth: 1940/11/16  Today's Date: 07/15/2016 OT Individual Time: [id number] OT Individual Time Calculation (min): 55 min     Short Term Goals:Week 2:  OT Short Term Goal 1 (Week 2): Pt will complete toilet transfer with Min A OT Short Term Goal 2 (Week 2): Pt will complete UB dressing with Min A OT Short Term Goal 3 (Week 2): Pt will sequence 4/10 steps during bathing with mod vcs OT Short Term Goal 4 (Week 2): Pt will complete LB dressing with Mod A  Skilled Therapeutic Interventions/Progress Updates:    Treatment session with focus on initiation and orientation during self-care retraining.  Pt received in bed requiring min cues and increased wait time to allow pt to initiate bed mobility.  Approx 3 mins for pt to come from sitting EOB to w/c with use of RW.  Min assist /tactile cues to initiate weight shifting as needed for sit > stand with pt completing lift off without additional assist.  Once upright ambulated to w/c with RW with min guard and cues to keep RW close during turn to sit in w/c.  Pt declined bathing this session, stating nursing staff assisted him with bathing already.  Focused on initiation, sequencing, and orientation with dressing tasks.  Pt required increased time with donning shirt, even minutes before asking for assistance.  Pt attempted to thread arm through outside of sleeve instead of through body of shirt and thread feet through bottom of pant leg instead of through waist band.  Setup assist to reorient with pt requiring max-total cues for orientation and sequencing throughout dressing task as well as max-total physical assist.  Setup at sink for oral care, with pt able to apply toothpaste and brush teeth from seated position without assist.  Therapy Documentation Precautions:  Precautions Precautions: Fall Restrictions Weight Bearing Restrictions: No Pain: Pain  Assessment Pain Assessment: No/denies pain Pain Score: 0-No pain  See Function Navigator for Current Functional Status.   Therapy/Group: Individual Therapy  Simonne Come 07/15/2016, 9:25 AM

## 2016-07-15 NOTE — Progress Notes (Signed)
Speech Language Pathology Daily Session Note  Patient Details  Name: NORMAL GRESS MRN: [id number] Date of Birth: 10/04/1941  Today's Date: 07/15/2016 SLP Individual Time: [id number]; [id number] SLP Individual Time Calculation (min): 30 min; 30 min    Short Term Goals: Week 2: SLP Short Term Goal 1 (Week 2): Patient will initiate functional tasks with no more than 2 repetition cues in 50% of given opportunities. SLP Short Term Goal 2 (Week 2): Patient will demonstrate functional problem solving during basic tasks given Min A verbal and visual cues.  SLP Short Term Goal 3 (Week 2): Patient will demonstrate intellectual awareness of 2 cognitive and 2 physical deficits given Mod A verbal cues.  SLP Short Term Goal 4 (Week 2): Patient will consume regular textures and thin liquids via straw during a meal, demonstrating efficient mastication and oral clearance with no overt s/s of aspiration. SLP Short Term Goal 5 (Week 2): Pt will utilize external aids to facilitate recall of daily information with min assist.   Skilled Therapeutic Interventions:   Session 1: Pt was seen for skilled ST targeting dysphagia goals.  SLP facilitated the session with a functional snack of regular textures and thin liquids to continue working towards diet progression.  Pt consumed solid crackers and peanut butter with mod I use of swallowing precautions to clear residue from the oral cavity.  Rate and portion control was appropriate throughout snack and no overt s/s of aspiration were evident with solids or liquids.  Recommend diet advancement to regular textures and thin liquids at this time with intermittent supervision for swallowing precautions.  Continue per current plan of care.  Session 2:  Pt was seen for skilled ST targeting cognitive goals.  SLP facilitated the session with a novel card game to address functional problem solving.and task initiation.  Pt was able to locate matches in sets of 3 from a field of 9  with min assist verbal cues for working memory of location of all 3 cards.  Pt was left in bed at the end of today's therapy session with bed alarm set and call bell left within reach.  Continue per current plan of care.    Function:  Eating Eating   Modified Consistency Diet: Yes Eating Assist Level: Supervision or verbal cues;Set up assist for   Eating Set Up Assist For: Opening containers       Cognition Comprehension Comprehension assist level: Follows basic conversation/direction with extra time/assistive device  Expression   Expression assist level: Expresses basic needs/ideas: With extra time/assistive device  Social Interaction Social Interaction assist level: Interacts appropriately 75 - 89% of the time - Needs redirection for appropriate language or to initiate interaction.  Problem Solving Problem solving assist level: Solves basic 75 - 89% of the time/requires cueing 10 - 24% of the time  Memory Memory assist level: Recognizes or recalls 50 - 74% of the time/requires cueing 25 - 49% of the time    Pain Pain Assessment Pain Assessment: No/denies pain Pain Score: 0-No pain  Therapy/Group: Individual Therapy  Fayette Gasner, Selinda Orion 07/15/2016, 4:40 PM

## 2016-07-15 NOTE — Progress Notes (Signed)
Physical Therapy Session Note  Patient Details  Name: Nathan Frank MRN: [id number] Date of Birth: 08-Jun-1941  Today's Date: 07/15/2016 PT Individual Time: 1000-1100 PT Individual Time Calculation (min): 60 min    Short Term Goals: Week 2:  PT Short Term Goal 1 (Week 2): Pt will propel w/c 100' with supervision  PT Short Term Goal 2 (Week 2): Pt will transfer with min assist and LRAD PT Short Term Goal 3 (Week 2): Pt will ambulate 100' with LRAD and min assist PT Short Term Goal 4 (Week 2): Pt will initiate sit<>stand with min verbal cues in 50% of observed trials.   Skilled Therapeutic Interventions/Progress Updates: Pt resting in w/c on arrival, no c/o pain, and agreeable to therapy session.  Session focus on sit<>stand initiation, strengthening, activity tolerance, balance, attention and memory.  Pt negotiates 4 steps with 2 rails and overall min assist with max verbal cues to sequence LEs and to attend to LUE on rail.  Attempting to turn to sit in w/c following stair negotiation pt assumed a forward flexed position and unable to come to standing despite therapist's cues to "stand up tall," pt saying "I am!" Ultimately required +2 to maintain balance and pivot hips into w/c.  Sit<>stand from w/c requiring steady assist and pt pushing heavily with LEs into w/c requiring therapist to steady w/c and pt with strong posterior lean when initially standing requiring tactile cues to shift weight forward.  Pt engaged in standing card matching activity for standing tolerance and balance with min verbal cues for increasing BOS and midline orientation.   Sit<>stand x2 from w/c (43 seconds and 20 seconds to initiate) requiring overall steady assist and tactile cues for shifting weight forward over feet and pt engaged in dynamic standing activity reaching for horse shoes.  Pt returned to room at end of session and performed squat/pivot back to bed with tactile cues to initiate transfer.  Pt positioned to  comfort with call bell in reach and needs met.      Therapy Documentation Precautions:  Precautions Precautions: Fall Restrictions Weight Bearing Restrictions: No   See Function Navigator for Current Functional Status.   Therapy/Group: Individual Therapy  Ronita Hargreaves E Penven-Crew 07/15/2016, 10:32 AM

## 2016-07-15 NOTE — Telephone Encounter (Signed)
RE: Inpatient Notes  Received: 1 week ago  Message Contents  Fay Records, MD  Rodman Key, RN        Please make sure pt has f/u with me or APP in 3 wk  Pt should probably start anticoagulation   Previous Messages    ----- Message -----  From: Rosalin Hawking, MD  Sent: 07/05/2016  7:34 AM  To: Fay Records, MD  Subject: RE: Inpatient Notes                Thank you, Dr. Harrington Challenger, for your help. He has right large MCA infarct, if anticoagulation is deemed appropriate, it can be started as early as 2 weeks post stroke. Thanks again.   Jindong  ----- Message -----  From: Fay Records, MD  Sent: 07/05/2016  7:17 AM  To: Rodman Key, RN, Rosalin Hawking, MD  Subject: RE: Inpatient Notes                Thank you for the update. I will set up to see the pt in clinic When would it be OK to cnsider anticoagulation?  ----- Message -----  From: Rosalin Hawking, MD  Sent: 07/04/2016  5:22 PM  To: Fay Records, MD  Subject: Inpatient Notes                  Dear Dr. Harrington Challenger:   Mr. Mercure is being discharged to CIR this time after treated for right large MCA infarct with mechanical thrombectomy of right MCA occlusion likely due to paroxysmal afib not on anticoagulation. He has pAfib in the past, but consider not candidate for anticoagulation due to alcoholic liver cirrhosis, GIB due to NSAIDs use, thrombocytopenia, substance abuse and medication noncompliance. Given the right large MCA infarct this time, afib RVR during hospitalization, has quit alcohol (as per pt), UDS neg, and platelet around 100, I am not sure he can be a candidate for anticoagulation this time for stroke prevention. I would like him to follow up with you after CIR discharge for evaluation. Thanks much for your help.   Jindong Xu      PATIENT REMAINS AT [facility] AS OF THIS DATE.  ON LOVENOX PER PROGRESS NOTES.

## 2016-07-15 NOTE — Progress Notes (Signed)
Recreational Therapy Session Note  Patient Details  Name: Nathan Frank MRN: [id number] Date of Birth: 06/27/1941 Today's Date: 07/15/2016  Pain: no c/o Skilled Therapeutic Interventions/Progress Updates: session focused on activity tolerance, sit-stands, dynamic standing balance, UE use & initiation during co-treat with PT.  Pt performed sit-stands with min assist and cuing for initiation.  Once standing, pt reaching outside BOS for horseshoe activity with min assist.  Therapy/Group: Co-Treatment Jhoana Upham 07/15/2016, 2:41 PM

## 2016-07-15 NOTE — Progress Notes (Signed)
Speech Language Pathology Daily Session Note  Patient Details  Name: Nathan Frank MRN: [id number] Date of Birth: [birth year]  Today's Date: 07/15/2016 SLP Individual Time: 1130-1200 SLP Individual Time Calculation (min): 30 min   Short Term Goals: Week 2: SLP Short Term Goal 1 (Week 2): Patient will initiate functional tasks with no more than 2 repetition cues in 50% of given opportunities. SLP Short Term Goal 2 (Week 2): Patient will demonstrate functional problem solving during basic tasks given Min A verbal and visual cues.  SLP Short Term Goal 3 (Week 2): Patient will demonstrate intellectual awareness of 2 cognitive and 2 physical deficits given Mod A verbal cues.  SLP Short Term Goal 4 (Week 2): Patient will consume regular textures and thin liquids via straw during a meal, demonstrating efficient mastication and oral clearance with no overt s/s of aspiration. SLP Short Term Goal 5 (Week 2): Pt will utilize external aids to facilitate recall of daily information with min assist.   Skilled Therapeutic Interventions:   Skilled treatment session focused on addressing dysphagia and cognition goals. SLP facilitated session by providing extra time and 3 repetitions to initiate sitting up edge of bed to optimize safety for PO trials.  Patient initiated set-up of opening containers, but required and requested help to successfully open.  Patient consumed regular textures and thin liquids via straw with timely mastication and oral clearance with 4-5 bites and then refused further trials; no overt s/s of aspiration were observed.  Recommend meal observation of advanced textures prior to upgrade given minimal observations today.  Continue with current plan of care.   Function:  Eating Eating   Modified Consistency Diet: No (trials with SLP) Eating Assist Level: Set up assist for;Supervision or verbal cues   Eating Set Up Assist For: Opening containers       Cognition Comprehension  Comprehension assist level: Follows basic conversation/direction with extra time/assistive device  Expression   Expression assist level: Expresses basic needs/ideas: With extra time/assistive device  Social Interaction Social Interaction assist level: Interacts appropriately 75 - 89% of the time - Needs redirection for appropriate language or to initiate interaction.  Problem Solving Problem solving assist level: Solves basic 75 - 89% of the time/requires cueing 10 - 24% of the time  Memory Memory assist level: Recognizes or recalls 50 - 74% of the time/requires cueing 25 - 49% of the time    Pain Pain Assessment Pain Assessment: No/denies pain  Therapy/Group: Individual Therapy  Carmelia Roller., [place] [id number]  [place] 07/15/2016, 9:20 PM

## 2016-07-15 NOTE — Plan of Care (Signed)
Problem: RH Wheelchair Mobility Goal: LTG Patient will propel w/c in controlled environment (PT) LTG: Patient will propel wheelchair in controlled environment, # of feet with assist (PT)  Downgraded to slow progress with initiation and attention to task Goal: LTG Patient will propel w/c in home environment (PT) LTG: Patient will propel wheelchair in home environment, # of feet with assistance (PT).  Downgraded due to slow progress with initiation and attention to task Goal: LTG Patient will propel w/c in community environment (PT) LTG: Patient will propel wheelchair in community environment, # of feet with assist (PT)  Outcome: Not Applicable Date Met: 62/86/38 Pt will not self propel in community setting

## 2016-07-16 ENCOUNTER — Inpatient Hospital Stay (HOSPITAL_COMMUNITY): Payer: Medicare Other | Admitting: Occupational Therapy

## 2016-07-16 ENCOUNTER — Ambulatory Visit (HOSPITAL_COMMUNITY): Payer: Self-pay | Admitting: Physical Therapy

## 2016-07-16 ENCOUNTER — Inpatient Hospital Stay (HOSPITAL_COMMUNITY): Payer: Medicare Other | Admitting: Speech Pathology

## 2016-07-16 ENCOUNTER — Inpatient Hospital Stay (HOSPITAL_COMMUNITY): Payer: Self-pay | Admitting: Physical Therapy

## 2016-07-16 LAB — BASIC METABOLIC PANEL
Anion gap: 9 (ref 5–15)
BUN: 7 mg/dL (ref 6–20)
CALCIUM: 8.6 mg/dL — AB (ref 8.9–10.3)
CO2: 25 mmol/L (ref 22–32)
CREATININE: 1.23 mg/dL (ref 0.61–1.24)
Chloride: 104 mmol/L (ref 101–111)
GFR, EST NON AFRICAN AMERICAN: 56 mL/min — AB (ref >60)
Glucose, Bld: 130 mg/dL — ABNORMAL HIGH (ref 65–99)
Potassium: 4.2 mmol/L (ref 3.5–5.1)
SODIUM: 138 mmol/L (ref 135–145)

## 2016-07-16 MED ORDER — FUROSEMIDE 20 MG PO TABS
20.0000 mg | ORAL_TABLET | Freq: Two times a day (BID) | ORAL | Status: DC
  Administered 2016-07-17 – 2016-07-19 (×4): 20 mg via ORAL
  Filled 2016-07-16 (×5): qty 1

## 2016-07-16 NOTE — Progress Notes (Signed)
Physical Therapy Session Note  Patient Details  Name: Nathan Frank MRN: [id number] Date of Birth: 1940/11/06  Today's Date: 07/16/2016 PT Individual Time: 1100-1200 PT Individual Time Calculation (min): 60 min    Short Term Goals: Week 2:  PT Short Term Goal 1 (Week 2): Pt will propel w/c 100' with supervision  PT Short Term Goal 2 (Week 2): Pt will transfer with min assist and LRAD PT Short Term Goal 3 (Week 2): Pt will ambulate 100' with LRAD and min assist PT Short Term Goal 4 (Week 2): Pt will initiate sit<>stand with min verbal cues in 50% of observed trials.   Skilled Therapeutic Interventions/Progress Updates:    Pt resting in w/c with no c/o pain and agreeable to therapy session.  Session focus on attention to task, initiation, activity tolerance, transfers, and gait.  Pt propelled w/c x50' with more than a reasonable amount of time, overall mod assist to maintain straight pathway, and max cues for sequencing and to stay attended to task.  Pt transfers sit<>stand x3 throughout session with steady assist and 30>50 seconds to initiate.  Gait training x120' +40' with RW and close supervision with seated rest break between trials.  Pt becomes SOB with gait, O2 97-100% and HR at 126 following each gait trial.  Pt returned to room in w/c at end of session and positioned with call bell in reach and lunch tray set up.   Therapy Documentation Precautions:  Precautions Precautions: Fall Restrictions Weight Bearing Restrictions: No  See Function Navigator for Current Functional Status.   Therapy/Group: Individual Therapy  Earnest Conroy Penven-Crew 07/16/2016, 12:14 PM

## 2016-07-16 NOTE — Progress Notes (Signed)
[facility]     PROGRESS NOTE    Subjective/Complaints: No dizziness , discussed BPs  ROS: Pt denies fever, rash/itching, headache, blurred or double vision, nausea, vomiting, abdominal pain, diarrhea, chest pain, shortness of breath, palpitations, dysuria, dizziness, neck or back pain, bleeding, anxiety, or depression   Objective: Vital Signs: Blood pressure 99/66, pulse 71, temperature 98 F (36.7 C), temperature source Oral, resp. rate 18, height 6' (1.829 m), weight 84.4 kg (186 lb), SpO2 100 %. No results found. No results for input(s): WBC, HGB, HCT, PLT in the last 72 hours.  Recent Labs  07/16/16 1011  NA 138  K 4.2  CL 104  GLUCOSE 130*  BUN 7  CREATININE 1.23  CALCIUM 8.6*   CBG (last 3)  No results for input(s): GLUCAP in the last 72 hours.  Wt Readings from Last 3 Encounters:  07/16/16 84.4 kg (186 lb)  06/30/16 103.8 kg (228 lb 14.4 oz)  06/16/16 92.6 kg (204 lb 3.2 oz)    Physical Exam:  Constitutional: He is oriented to person, place, and time. He appears well-developed and well-nourished.  HENT:  Head: Normocephalic and atraumatic.  Eyes: Conjunctivae are normal. Pupils are equal, round, and reactive to light.  Neck: Normal range of motion. Neck supple.  Cardiovascular: Normal rate.  An irregular rhythm present. Exam reveals no gallop and no friction rub.   No murmur heard. Respiratory: Effort normal. No stridor. No respiratory distress. He has no wheezes. He has rhonchi. He has no rales.  GI: Soft. Bowel sounds are increased. There is no tenderness. There is no rebound and no guarding.  Mild distention  MVE:HMCNO edema BLE Neurological: He is alert and oriented to person, place, and time.  Left facial weakness with minimal dysarthria and occasional wet voice. Flat affect with delayed processing and slow movements.Fair insight and awareness. He is able to follow simple one and two step motor commands.  Left sided  weakness: LUW 2/5 deltoid, bicep, tricep, wrist, hand, 1+ to 2-/5 HF, KE, ADF/PF. Senses pain Left arm and leg.   Skin: Skin is warm and dry. No rash noted.  BLE with dry flaky skin.  Psychiatric: His affect is blunt. His speech is delayed. He is slowed.   Assessment/Plan: 1. 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 Physiatrist is providing close team supervision and 24 hour management of active medical problems listed below. Physiatrist and rehab team continue to assess barriers to discharge/monitor patient progress toward functional and medical goals.  Function:  Bathing Bathing position   Position: Wheelchair/chair at sink  Bathing parts Body parts bathed by patient: Right arm, Left arm, Chest, Abdomen (pt declined LB bathing stating RN assisting this AM) Body parts bathed by helper: Left lower leg, Right lower leg, Back  Bathing assist Assist Level:  (Mod Assist)      Upper Body Dressing/Undressing Upper body dressing   What is the patient wearing?: Pull over shirt/dress     Pull over shirt/dress - Perfomed by patient: Thread/unthread right sleeve, Put head through opening Pull over shirt/dress - Perfomed by helper: Thread/unthread left sleeve, Pull shirt over trunk        Upper body assist Assist Level:  (Mod assist)      Lower Body Dressing/Undressing Lower body dressing   What is the patient wearing?: Pants, [employer], Shoes   Underwear - Performed  by helper: Thread/unthread right underwear leg, Thread/unthread left underwear leg, Pull underwear up/down Pants- Performed by patient: Thread/unthread right pants leg Pants- Performed by helper: Thread/unthread left pants leg, Pull pants up/down   Non-skid slipper socks- Performed by helper: Don/doff right sock, Don/doff left sock   Socks - Performed by helper: Don/doff right sock,  Don/doff left sock   Shoes - Performed by helper: Don/doff right shoe, Don/doff left shoe, Fasten right, Fasten left       TED Hose - Performed by helper: Don/doff right TED hose, Don/doff left TED hose  Lower body assist Assist for lower body dressing:  (Max assist)      Toileting Toileting Toileting activity did not occur: No continent bowel/bladder event Toileting steps completed by patient: Performs perineal hygiene Toileting steps completed by helper: Adjust clothing prior to toileting, Performs perineal hygiene, Adjust clothing after toileting Toileting Assistive Devices: Grab bar or rail  Toileting assist Assist level:  (Max assist)   Transfers Chair/bed transfer   Chair/bed transfer method: Stand pivot Chair/bed transfer assist level: Supervision or verbal cues Chair/bed transfer assistive device: Armrests, Environmental manager lift: Ecologist     Max distance: 120 Assist level: Supervision or verbal cues   Wheelchair   Type: Manual Max wheelchair distance: 50 Assist Level: Moderate assistance (Pt 50 - 74%)  Cognition Comprehension Comprehension assist level: Follows basic conversation/direction with extra time/assistive device  Expression Expression assist level: Expresses basic needs/ideas: With extra time/assistive device  Social Interaction Social Interaction assist level: Interacts appropriately 90% of the time - Needs monitoring or encouragement for participation or interaction.  Problem Solving Problem solving assist level: Solves basic 75 - 89% of the time/requires cueing 10 - 24% of the time  Memory Memory assist level: Recognizes or recalls 50 - 74% of the time/requires cueing 25 - 49% of the time   Medical Problem List and Plan: 1.  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  DVT Prophylaxis/Anticoagulation:   Lovenox 3. Pain Management: tylenol prn. Support  limbs as needed to maintain joint position and prevent contracture 4. Mood: team to follow for ego support. SW to assess upon admit.  5. Neuropsych: This patient is not yet capable of making decisions on his own behalf. 6. Skin/Wound Care: encourage nutrition and pressure relief as needed 7. Fluids/Electrolytes/Nutrition: encourage PO. 8. Afib: ASA daily             -metoprolol increased to 8m BID for better rate,  Vitals:   07/16/16 1200 07/16/16 1355  BP: (!) 145/85 99/66  Pulse:  71  Resp:  18  Temp:  98 F (36.7 C)               Mild tachycardia  generally running in the 90 range, no increase in beta blockers due to soft BPs             -adjust regimen as needed 9. AKI: Creatinine looks to be at a baseline             -continue to push pos  -. Has hypokalemia as well, likely due to diuretics, potassium supplementation was increased as of 07/10/2016, repeat be met              - has been on spironolactone and potassium and Lasix at home. We will continue these medications. Increase task see him, increased Lasix to 40 mg twice a day .  Normal recheck basic metabolic panel on 85/92. 11. Anemia:  Hgb 14.1 at admission down to --->8.5. 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             -monitor LFT's and ammonia as needed                    -no evidence of encephalopathy. At the current time 80. HTN--metoprolol 14.  CHF ef 40-45% monitor for pulm edema,CXR, showing pulmonary edema as well as bibasilar atelectasis. No clear-cut infiltrate as of 07/09/2016, lower extremity edema resolved on diuretics. Reduce Lasix 20 mg twice a day and spironolactone  LOS (Days) 12 A FACE TO FACE EVALUATION WAS PERFORMED  Alysia Penna E 07/16/2016 4:08 PM

## 2016-07-16 NOTE — Progress Notes (Signed)
Speech Language Pathology Daily Session Note  Patient Details  Name: Nathan Frank MRN: [id number] Date of Birth: January 26, 1941  Today's Date: 07/16/2016 SLP Individual Time: 0800-0900 SLP Individual Time Calculation (min): 60 min   Short Term Goals:Week 2: SLP Short Term Goal 1 (Week 2): Patient will initiate functional tasks with no more than 2 repetition cues in 50% of given opportunities. SLP Short Term Goal 2 (Week 2): Patient will demonstrate functional problem solving during basic tasks given Min A verbal and visual cues.  SLP Short Term Goal 3 (Week 2): Patient will demonstrate intellectual awareness of 2 cognitive and 2 physical deficits given Mod A verbal cues.  SLP Short Term Goal 4 (Week 2): Patient will consume regular textures and thin liquids via straw during a meal, demonstrating efficient mastication and oral clearance with no overt s/s of aspiration. SLP Short Term Goal 5 (Week 2): Pt will utilize external aids to facilitate recall of daily information with min assist.   Skilled Therapeutic Interventions: Pt was seen for skilled ST targeting goals for cognition and dysphagia.  Pt consumed regular textures on breakfast meal tray with mod I use of precautions.  Pt cleared solids from the oral cavity without difficulty and no overt s/s of aspiration were evident with solids or liquids.  Initiation of transfer out of bed was improved with min-mod assist verbal cues for counting to prepare for transfer.  Pt needed min assist verbal cues for task organization when completing oral care at the sink.  Pt left in wheelchair with call bell within reach and quick release belt donned for safety.  Continue per current plan of care.       Function:  Eating Eating   Modified Consistency Diet: No Eating Assist Level: Set up assist for;Supervision or verbal cues   Eating Set Up Assist For: Opening containers       Cognition Comprehension Comprehension assist level: Follows basic  conversation/direction with extra time/assistive device  Expression   Expression assist level: Expresses basic needs/ideas: With extra time/assistive device  Social Interaction Social Interaction assist level: Interacts appropriately 75 - 89% of the time - Needs redirection for appropriate language or to initiate interaction.  Problem Solving Problem solving assist level: Solves basic 75 - 89% of the time/requires cueing 10 - 24% of the time  Memory Memory assist level: Recognizes or recalls 50 - 74% of the time/requires cueing 25 - 49% of the time    Pain Pain Assessment Pain Assessment: No/denies pain  Therapy/Group: Individual Therapy  Nyleah Mcginnis, Selinda Orion 07/16/2016, 9:05 AM

## 2016-07-16 NOTE — Progress Notes (Signed)
Occupational Therapy Session Note  Patient Details  Name: Nathan Frank MRN: [id number] Date of Birth: June 20, 1941  Today's Date: 07/16/2016 OT Individual Time: [id number] and 1400-1430 OT Individual Time Calculation (min): 55 min and 30 min    Short Term Goals:Week 2:  OT Short Term Goal 1 (Week 2): Pt will complete toilet transfer with Min A OT Short Term Goal 2 (Week 2): Pt will complete UB dressing with Min A OT Short Term Goal 3 (Week 2): Pt will sequence 4/10 steps during bathing with mod vcs OT Short Term Goal 4 (Week 2): Pt will complete LB dressing with Mod A  Skilled Therapeutic Interventions/Progress Updates:    1)Treatment session with focus on initiation and sequencing during self-care tasks.  Low BP upon arrival, therefore monitored pt throughout session with pt reporting asymptomatic throughout.  Engaged in bathing and dressing at sit > stand level at sink, with pt continuing to refuse shower.  Pt demonstrated improved initiation and sequencing this session with setup and min cues for thoroughness with bathing.  Pt donned button shirt with pullover technique to increase continuity of self-care retraining.  Pt required initial setup to ensure proper orientation of shirt with pt able to successfully thread both arms into arm holes with min cues for Lt arm.  Provided question cues for sequencing to pull shirt over head.  Pt required assist to fully thread Lt arm through sleeve, as he got stuck when pulling shirt over head.  Pt thread Rt pant leg into pants by crossing Rt leg over Lt knee and requiring mod cues for encouragement and to continue attempt through to completion.  Min-mod assist sit > stand with tactile cue to initiate weight shift and liftoff.  Pt with improved standing posture with BUE support on sink while therapist pulled pants over hips.    2) Treatment session with focus on Lt inattention and increased use of LUE during table top activity.  Pt asleep in bed upon  arrival and willing to participate in therapy session but not wanting to get OOB.  Positioned pt upright in chair position in bed.  Utilized resistive peg board and large pegs placed on Lt with pt utilizing LUE to obtain colored pegs to replicate picture provided.  Pt demonstrating visual spatial deficits as pt placing pegs typically one to two spaces over from picture.  Provided max cues for sequencing of pattern with reference to picture as well as pieces already placed.  Pt utilized LUE approx 70% of time, occasionally utilizing RUE to reposition pegs for increased grasp.  Pt continues to report decreased strength in Lt hand.  Therapy Documentation Precautions:  Precautions Precautions: Fall Restrictions Weight Bearing Restrictions: No General:   Vital Signs: Therapy Vitals BP: (!) 88/62 Pain: Pain Assessment Pain Assessment: No/denies pain  See Function Navigator for Current Functional Status.   Therapy/Group: Individual Therapy  Simonne Come 07/16/2016, 10:15 AM

## 2016-07-17 ENCOUNTER — Inpatient Hospital Stay (HOSPITAL_COMMUNITY): Payer: Medicare Other | Admitting: Occupational Therapy

## 2016-07-17 ENCOUNTER — Inpatient Hospital Stay (HOSPITAL_COMMUNITY): Payer: Medicare Other | Admitting: Speech Pathology

## 2016-07-17 ENCOUNTER — Inpatient Hospital Stay (HOSPITAL_COMMUNITY): Payer: Self-pay | Admitting: Physical Therapy

## 2016-07-17 NOTE — Progress Notes (Signed)
Speech Language Pathology Daily Session Note  Patient Details  Name: Nathan Frank MRN: [id number] Date of Birth: Jul 12, 1941  Today's Date: 07/17/2016 SLP Individual Time: 1335-1435 SLP Individual Time Calculation (min): 60 min   Short Term Goals: Week 2: SLP Short Term Goal 1 (Week 2): Patient will initiate functional tasks with no more than 2 repetition cues in 50% of given opportunities. SLP Short Term Goal 2 (Week 2): Patient will demonstrate functional problem solving during basic tasks given Min A verbal and visual cues.  SLP Short Term Goal 3 (Week 2): Patient will demonstrate intellectual awareness of 2 cognitive and 2 physical deficits given Mod A verbal cues.  SLP Short Term Goal 4 (Week 2): Patient will consume regular textures and thin liquids via straw during a meal, demonstrating efficient mastication and oral clearance with no overt s/s of aspiration. SLP Short Term Goal 5 (Week 2): Pt will utilize external aids to facilitate recall of daily information with min assist.   Skilled Therapeutic Interventions:  Pt was seen for skilled ST targeting cognitive goals.  Pt continues to demonstration improved initiation during transfers and was able to get from bed to wheelchair in <1 minute with min assist verbal cues.  SLP facilitated the session with a picture sequencing task targeting problem solving.  Pt required min assist verbal cues for organization and error awareness during task for 100% accuracy.  Additionally, during functional conversations with SLP pt was able to recall rules of a previously taught card game and identify 1 goal of ST with supervision question cues.  Pt was returned to room and left in bed with call bell within reach and bed alarm set.  Continue per current plan of care.    Function:  Eating Eating                 Cognition Comprehension Comprehension assist level: Follows basic conversation/direction with extra time/assistive device  Expression    Expression assist level: Expresses basic needs/ideas: With extra time/assistive device  Social Interaction Social Interaction assist level: Interacts appropriately 75 - 89% of the time - Needs redirection for appropriate language or to initiate interaction.  Problem Solving Problem solving assist level: Solves basic 50 - 74% of the time/requires cueing 25 - 49% of the time  Memory Memory assist level: Recognizes or recalls 75 - 89% of the time/requires cueing 10 - 24% of the time    Pain Pain Assessment Pain Assessment: No/denies pain  Therapy/Group: Individual Therapy  Sabriah Hobbins, Selinda Orion 07/17/2016, 3:43 PM

## 2016-07-17 NOTE — Patient Care Conference (Signed)
Inpatient RehabilitationTeam Conference and Plan of Care Update Date: 07/15/2016   Time: 11:00 AM    Patient Name: Nathan Frank      Medical Record Number: [id number]  Date of Birth: [birth year] Sex: Male         Room/Bed: 4W03C/4W03C-01 Payor Info: Payor: [employer] MEDICARE / Plan: [employer] MEDICARE / Product Type: *No Product type* /    Admitting Diagnosis: R CVA  Admit Date/Time:  07/04/2016  5:58 PM Admission Comments: No comment available   Primary Diagnosis:  Cerebral infarction due to embolism of right middle cerebral artery (HCC) Principal Problem: Cerebral infarction due to embolism of right middle cerebral artery [facility])  Patient Active Problem List   Diagnosis Date Noted  . Left hemiparesis ([place]) 07/04/2016  . Acute ischemic right MCA stroke ([place]) 07/04/2016  . Obesity 07/01/2016  . Family hx-stroke 07/01/2016  . Hyperglycemia 07/01/2016  . Wound of left leg 07/01/2016  . Dysphagia, post-stroke   . Dysarthria, post-stroke   . Pyrexia   . Bleeding gastrointestinal   . Polysubstance abuse   . Acute on chronic combined systolic and diastolic heart failure ([place])   . Mitral valve regurgitation   . Tachypnea   . Anemia of chronic disease   . Leukocytosis   . Cerebral infarction due to embolism of right middle cerebral artery ([place]) - s/p mechanical thrombectomy 06/25/2016  . Hypomagnesemia 06/09/2016  . Acute on chronic diastolic CHF (congestive heart failure) ([place]) 06/09/2016  . Alcoholic cirrhosis of liver with ascites ([place])   . Other specified hypothyroidism   . Hepatocellular carcinoma ([place])   . Alcohol withdrawal ([place]) 05/27/2016  . Generalized weakness 05/27/2016  . Hypokalemia 05/27/2016  . Dehydration 05/27/2016  . Diarrhea 04/28/2016  . SIRS (systemic inflammatory response syndrome) ([place]) 04/19/2016  . Alcohol use disorder, moderate, in controlled environment, dependence ([place])   . Lactic acidosis   . Tachycardia   . Atrial fibrillation with RVR ([place])  04/18/2016  . Chest pain 04/18/2016  . Hypotension 04/18/2016  . Elevated lactic acid level 04/18/2016  . Chronic atrial fibrillation ([place]) 04/18/2016  . SVT (supraventricular tachycardia) ([place])   . Prolonged Q-T interval on ECG   . Neuropathy ([place])   . Essential hypertension   . Thrombocytopenia ([place]) 04/16/2015  . Abnormal TSH 04/16/2015  . NSTEMI (non-ST elevated myocardial infarction) ([place])   . Acute gastric ulcer   . Acute gastritis with hemorrhage   . GI bleed due to NSAIDs 12/13/2014  . Right knee pain 12/13/2014  . Dizziness and giddiness 12/13/2014  . Upper GI bleeding 12/13/2014  . Heroin overdose 02/20/2014  . S/P alcohol detoxification 02/02/2014  . Alcohol dependence ([place]) 02/02/2014  . Depressive disorder 02/01/2014  . Symptomatic cholelithiasis 12/15/2013  . Abdominal pain 12/14/2013  . Gastritis 06/07/2012  . DVT of lower limb, acute ([place]) 06/06/2012  . Oral thrush 06/05/2012  . Alcohol abuse 06/04/2012  . Weight loss 06/04/2012  . 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 Liver cirrhosis ([place]) 06/04/2012  . Hematuria 06/04/2012    Expected Discharge Date: Expected Discharge Date: 07/30/16  Team Members Present: Physician leading conference: Dr. Alysia Penna Social Worker Present: Alfonse Alpers, LCSW Nurse Present: Dorien Chihuahua, RN PT Present: Dwyane Dee, PT OT Present: Simonne Come, OT SLP Present: Windell Moulding, SLP PPS Coordinator present : Daiva Nakayama, RN, CRRN     Current Status/Progress Goal Weekly Team Focus  Medical   very delayed with initiation, poor sequencing, visuoperceptual issues  Maintain balance between hydration and edema management  fluid management, work on initiation   Bowel/Bladder   incontinent of bowe/bladder. condom cath at night LBM 07/14/16  to be continent of bowel/bladder while RH with min assist  moniotr bowel/bladder pattern and time toilet q2hrs   Swallow/Nutrition/ Hydration    Dys 3, thin liquids   mod I   trials of regular textures pending mentation   ADL's   min-mod assist sit > stand and standing balance with increased time for initiation, mod assist bathing and UB dressing, total assist LB dressing  supervision overall   initiation, orientation, sequencing, and functional tranfers, pt/family education   Mobility   min/mod assist, poor motor planning, decreased initiation and sequencing, posterior lean upon initial standing  supervision level overall, household ambulatory with RW or w/c, d/c'd community w/c goal as not appropriate  motor planning, initiation, balance, transfers, gait training, w/c propulsion   Communication   supervision   supervision   d/c goal    Safety/Cognition/ Behavioral Observations  mod-max assist for basic tasks   supervision   task initiation, awareness of deficits, sustained attention    Pain   denied pain or discomfort  <3  assess and treat pain q shift and as needed   Skin   L leg  skin trea with foam dressing  no skin breakdown/infection       Rehab Goals Patient on target to meet rehab goals: Yes Rehab Goals Revised: none *See Care Plan and progress notes for long and short-term goals.  Barriers to Discharge: Cognitive issues persist, Wife also forgetful    Possible Resolutions to Barriers:  Cont current meds, assess family assist ability, may need SNF    Discharge Planning/Teaching Needs:  Pt to return to his home with his wife to provide supervision.  Wife will come in for family education on 07-23-16.  She states she will be the one with pt at d/c.   Team Discussion:  BP is better controlled and leg swelling is better per Dr. Letta Pate.  Pt's initiation is impaired and he still has a posterior lean with not a lot of carryover.  Team is wondering if family can manage that.  Wife to come in for family education next week and will need multiple days of education.  Pt's working memory and initiation are his biggest  barriers.  Pt is very slow with ADLs and needs the entire session to dress with total assist and max cues.    Revisions to Treatment Plan:  none   Continued Need for Acute Rehabilitation Level of Care: The patient requires daily medical management by a physician with specialized training in physical medicine and rehabilitation for the following conditions: Daily direction of a multidisciplinary physical rehabilitation program to ensure safe treatment while eliciting the highest outcome that is of practical value to the patient.: Yes Daily medical management of patient stability for increased activity during participation in an intensive rehabilitation regime.: Yes Daily analysis of laboratory values and/or radiology reports with any subsequent need for medication adjustment of medical intervention for : Neurological problems;Cardiac problems;Renal problems  Aariah Godette, Silvestre Mesi 07/17/2016, 12:17 PM

## 2016-07-17 NOTE — Progress Notes (Addendum)
Physical Therapy Session Note  Patient Details  Name: Nathan Frank MRN: [id number] Date of Birth: 17-Aug-1941  Today's Date: 07/17/2016 PT Individual Time: [id number] PT individual Time Calculation. 27 min       Short Term Goals: Week 2:  PT Short Term Goal 1 (Week 2): Pt will propel w/c 100' with supervision  PT Short Term Goal 2 (Week 2): Pt will transfer with min assist and LRAD PT Short Term Goal 3 (Week 2): Pt will ambulate 100' with LRAD and min assist PT Short Term Goal 4 (Week 2): Pt will initiate sit<>stand with min verbal cues in 50% of observed trials.   Skilled Therapeutic Interventions/Progress Updates:     Patient received sitting in [facility] with trade off from other PT.  PT instructed patient in Car transfer with min Assist for safety and min cues for safe UE placement to not pull on RW to exit car.   Gait training with supervision assist x 50, 65, and 104ft with RW. Patient noted to require cues for improved breathing throughout gait to prevent SOB. All sit<>stand completed with supervision Assist with only min cues for safety and UE placement.   Throughout treatment, patient wa sable to initiate all movement given in one step and 2 step commands with delay of on more than 5 seconds.   Patient left sitting in WC with call bell in reach and quick release belt in place.   Therapy Documentation Precautions:  Precautions Precautions: Fall Restrictions Weight Bearing Restrictions: No General:   Vital Signs: Therapy Vitals Temp: 98.3 F (36.8 C) Temp Source: Oral Pulse Rate: 88 Resp: 18 BP: 115/68 Patient Position (if appropriate): Lying Oxygen Therapy SpO2: 99 % O2 Device: Not Delivered   See Function Navigator for Current Functional Status.   Therapy/Group: Individual Therapy  Lorie Phenix 07/17/2016, 6:12 AM

## 2016-07-17 NOTE — Progress Notes (Signed)
Physical Therapy Session Note  Patient Details  Name: SILVERIO TYRELL MRN: [id number] Date of Birth: 1941-01-15  Today's Date: 07/17/2016 PT Individual Time: [id number] PT Individual Time Calculation (min): 56 min    Short Term Goals: Week 2:  PT Short Term Goal 1 (Week 2): Pt will propel w/c 100' with supervision  PT Short Term Goal 2 (Week 2): Pt will transfer with min assist and LRAD PT Short Term Goal 3 (Week 2): Pt will ambulate 100' with LRAD and min assist PT Short Term Goal 4 (Week 2): Pt will initiate sit<>stand with min verbal cues in 50% of observed trials.   Skilled Therapeutic Interventions/Progress Updates:    Pt resting in w/c on arrival, states "I've been waiting for more than a hour for someone to help me sit on the side of the bed!" Reoriented pt to schedule and indicated that his OT session had just ended at 9:30.  Session focus on activity tolerance, attention to task, initiation, and gait training in simulated home environment.  Stand/pivot w/c<>therapy mat with 30-45 seconds to initiate and supervision to complete transfer.  Sit<>stand from mat x3 for table top activity focus on LUE coordination and standing tolerance.  Pt able to initiate transfer and stand with supervision for 1-2 minutes at a time before returning to sitting edge of mat.  Pt with decreased attention to table top activity with busy gym and requiring mod cues to stay attended to task.  Gait training x30' through obstacle course to simulate home environment with pt becoming SOB after 20' and requesting to sit down.  Stand/pivot back to w/c with supervision.  Hand off to next PT in hallway.   Therapy Documentation Precautions:  Precautions Precautions: Fall Restrictions Weight Bearing Restrictions: No   See Function Navigator for Current Functional Status.   Therapy/Group: Individual Therapy  Zaneta Lightcap E Penven-Crew 07/17/2016, 12:12 PM

## 2016-07-17 NOTE — Progress Notes (Signed)
Social Work Patient ID: Nathan Frank, male   DOB: [birth year], 75 y.o.   MRN: [id number]   CSW met with pt and then later talked with his wife via telephone.  Pt reports he will have extended family and friends to help him at home, but pt's wife stated she will be his caregiver.  She will come in for family education on 07-23-16 from 10am - 2pm to make sure she will be able to provide the care he will need at home.  CSW will continue to follow and assist as needed.

## 2016-07-17 NOTE — Progress Notes (Signed)
Occupational Therapy Session Note  Patient Details  Name: Nathan Frank MRN: [id number] Date of Birth: Nov 20, 1940  Today's Date: 07/17/2016 OT Individual Time: [id number] OT Individual Time Calculation (min): 60 min     Short Term Goals: Week 2:  OT Short Term Goal 1 (Week 2): Pt will complete toilet transfer with Min A OT Short Term Goal 2 (Week 2): Pt will complete UB dressing with Min A OT Short Term Goal 3 (Week 2): Pt will sequence 4/10 steps during bathing with mod vcs OT Short Term Goal 4 (Week 2): Pt will complete LB dressing with Mod A  Skilled Therapeutic Interventions/Progress Updates:    Treatment session with focus on initiation and sequencing during self-care tasks.  Engaged in bathing and dressing at sit > stand level from sink, as pt continues to refuse shower.  Bed mobility with supervision and increased time for initiation to come from sit > stand.  Mod cues for initiation with tactile cues to facilitate weight shift and counting to promote initiation.  Pt demonstrated improved initiation and sequencing with bathing this session with min cues for thoroughness.  Continues to require max cues for orientation and sequencing with dressing tasks.  Pt continues to demonstrate visual perceptual deficits with decreased orientation of shirt and attempting to apply toothpaste to end of toothbrush instead of bristles.  Educated pt on crossing leg over opposite knee to attempt to decrease burden of care with LB dressing.  Pt able to achieve position but continues to struggle with ability to sequence and initiate movement.  Requiring total assist for LB dressing.  Left seated upright in w/c with trial without quick release belt (as pt with decreased initiation) with pt stating that he would not attempt to get up without assist.  Therapy Documentation Precautions:  Precautions Precautions: Fall Restrictions Weight Bearing Restrictions: No Pain: Pain Assessment Pain Assessment:  No/denies pain Pain Score: 0-No pain  See Function Navigator for Current Functional Status.   Therapy/Group: Individual Therapy  Kashayla Ungerer, [place] 07/17/2016, 9:30 AM

## 2016-07-17 NOTE — Progress Notes (Signed)
[place] PHYSICAL MEDICINE & REHABILITATION     PROGRESS NOTE    Subjective/Complaints: No issues overnight. Increasing strength in the left upper extremity  ROS: Pt denies fever, rash/itching, headache, blurred or double vision, nausea, vomiting, abdominal pain, diarrhea, chest pain, shortness of breath, palpitations, dysuria, dizziness, neck or back pain, bleeding,  Objective: Vital Signs: Blood pressure 115/68, pulse 88, temperature 98.3 F (36.8 C), temperature source Oral, resp. rate 18, height 6' (1.829 m), weight 85 kg (187 lb 6.3 oz), SpO2 99 %. No results found. No results for input(s): WBC, HGB, HCT, PLT in the last 72 hours.  Recent Labs  07/16/16 1011  NA 138  K 4.2  CL 104  GLUCOSE 130*  BUN 7  CREATININE 1.23  CALCIUM 8.6*   CBG (last 3)  No results for input(s): GLUCAP in the last 72 hours.  Wt Readings from Last 3 Encounters:  07/17/16 85 kg (187 lb 6.3 oz)  06/30/16 103.8 kg (228 lb 14.4 oz)  06/16/16 92.6 kg (204 lb 3.2 oz)    Physical Exam:  Constitutional: He is oriented to person, place, and time. He appears well-developed and well-nourished.  HENT:  Head: Normocephalic and atraumatic.  Eyes: Conjunctivae are normal. Pupils are equal, round, and reactive to light.  Neck: Normal range of motion. Neck supple.  Cardiovascular: Normal rate.  An irregular rhythm present. Exam reveals no gallop and no friction rub.   No murmur heard. Respiratory: Effort normal. No stridor. No respiratory distress. He has no wheezes. He has rhonchi. He has no rales.  GI: Soft. Bowel sounds are increased. There is no tenderness. There is no rebound and no guarding.  Mild distention  [id number] edema BLE Neurological: He is alert and oriented to person, place, and time.  Left facial weakness with minimal dysarthria and occasional wet voice. Flat affect with delayed processing and slow movements.Fair insight and awareness. He is able to follow simple one and two step  motor commands.  Left sided weakness: LUW 3-/5 deltoid, bicep, tricep, wrist, hand, 3-/5 HF, KE,tr ADF/PF. Senses pain Left arm and leg.   Skin: Skin is warm and dry. No rash noted.  BLE with dry flaky skin.  Psychiatric: His affect is blunt. His speech is delayed. He is slowed.   Assessment/Plan: 1. 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 Physiatrist is providing close team supervision and 24 hour management of active medical problems listed below. Physiatrist and rehab team continue to assess barriers to discharge/monitor patient progress toward functional and medical goals.  Function:  Bathing Bathing position   Position: Wheelchair/chair at sink  Bathing parts Body parts bathed by patient: Right arm, Left arm, Chest, Abdomen, Right upper leg, Left upper leg (pt reports RN assisted with perineal area and buttocks ) Body parts bathed by helper: Back  Bathing assist Assist Level:  (Mod Assist)      Upper Body Dressing/Undressing Upper body dressing   What is the patient wearing?: Pull over shirt/dress     Pull over shirt/dress - Perfomed by patient: Thread/unthread right sleeve, Put head through opening Pull over shirt/dress - Perfomed by helper: Thread/unthread left sleeve, Pull shirt over trunk        Upper body assist Assist Level:  (Mod assist)      Lower Body Dressing/Undressing Lower body dressing   What is the patient wearing?: Pants, [employer], Shoes  Underwear - Performed by helper: Thread/unthread right underwear leg, Thread/unthread left underwear leg, Pull underwear up/down Pants- Performed by patient: Thread/unthread right pants leg Pants- Performed by helper: Thread/unthread left pants leg, Pull pants up/down, Thread/unthread right pants leg   Non-skid slipper socks- Performed by helper: Don/doff right sock, Don/doff left  sock   Socks - Performed by helper: Don/doff right sock, Don/doff left sock   Shoes - Performed by helper: Don/doff right shoe, Don/doff left shoe, Fasten right, Fasten left       TED Hose - Performed by helper: Don/doff right TED hose, Don/doff left TED hose  Lower body assist Assist for lower body dressing:  (Total assist)      Toileting Toileting Toileting activity did not occur: No continent bowel/bladder event Toileting steps completed by patient: Performs perineal hygiene Toileting steps completed by helper: Adjust clothing prior to toileting, Performs perineal hygiene, Adjust clothing after toileting Toileting Assistive Devices: Grab bar or rail  Toileting assist Assist level:  (Max assist)   Transfers Chair/bed transfer   Chair/bed transfer method: Stand pivot, Ambulatory Chair/bed transfer assist level: Touching or steadying assistance (Pt > 75%) Chair/bed transfer assistive device: Armrests, Environmental manager lift: Ecologist     Max distance: 120 Assist level: Supervision or verbal cues   Wheelchair   Type: Manual Max wheelchair distance: 50 Assist Level: Moderate assistance (Pt 50 - 74%)  Cognition Comprehension Comprehension assist level: Follows basic conversation/direction with extra time/assistive device  Expression Expression assist level: Expresses basic needs/ideas: With extra time/assistive device  Social Interaction Social Interaction assist level: Interacts appropriately 75 - 89% of the time - Needs redirection for appropriate language or to initiate interaction.  Problem Solving Problem solving assist level: Solves basic 50 - 74% of the time/requires cueing 25 - 49% of the time  Memory Memory assist level: Recognizes or recalls 50 - 74% of the time/requires cueing 25 - 49% of the time   Medical Problem List and Plan: 1.  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  DVT Prophylaxis/Anticoagulation:   Lovenox 3. Pain Management: tylenol prn. Support limbs as needed to maintain joint position and prevent contracture 4. Mood: team to follow for ego support. SW to assess upon admit.  5. Neuropsych: This patient is not yet capable of making decisions on his own behalf. 6. Skin/Wound Care: encourage nutrition and pressure relief as needed 7. Fluids/Electrolytes/Nutrition: encourage PO. 8. Afib: ASA daily             -metoprolol increased to 50mg  BID for better rate,  Vitals:   07/16/16 2030 07/17/16 0529  BP: 100/66 115/68  Pulse: (!) 116 88  Resp: 18 18  Temp:  98.3 F (36.8 C)               Mild tachycardia . If blood pressures, above 120. Would increase beta blocker             -adjust regimen as needed 9. AKI: Creatinine looks to be at a baseline             -continue to push pos  -.  hypokalemia  likely due to diuretics, potassium , resolved with supplementation             - has been on spironolactone and potassium and Lasix at home. We will continue these medications. Increase task see him, increased Lasix to 40  mg twice a day . Normal recheck basic metabolic panel on [id number]. 11. Anemia:  Hgb 14.1 at admission down to --->8.5. 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             -monitor LFT's and ammonia as needed                    -no evidence of encephalopathy. At the current time 21. HTN--metoprolol 14.  CHF ef 40-45% monitor for pulm edema,CXR, showing pulmonary edema as well as bibasilar atelectasis. No clear-cut infiltrate as of 07/09/2016, lower extremity edema resolved on diuretics. Reduce Lasix 20 mg Once a day and spironolactone 12.5 mg per day  LOS (Days) 13 A FACE TO FACE EVALUATION WAS PERFORMED  KIRSTEINS,ANDREW E 07/17/2016 10:58 AM

## 2016-07-18 ENCOUNTER — Inpatient Hospital Stay (HOSPITAL_COMMUNITY): Payer: Medicare Other | Admitting: Occupational Therapy

## 2016-07-18 ENCOUNTER — Inpatient Hospital Stay (HOSPITAL_COMMUNITY): Payer: Self-pay | Admitting: Speech Pathology

## 2016-07-18 NOTE — Progress Notes (Signed)
[place] PHYSICAL MEDICINE & REHABILITATION     PROGRESS NOTE    Subjective/Complaints: The patient asking about his Lasix dose. No dizziness  ROS: Pt denies fever, rash/itching, headache, blurred or double vision, nausea, vomiting, abdominal pain, diarrhea, chest pain, shortness of breath, palpitations, dysuria, dizziness, Objective: Vital Signs: Blood pressure 91/67, pulse (!) 120, temperature 98.4 F (36.9 C), temperature source Oral, resp. rate 18, height 6' (1.829 m), weight 85 kg (187 lb 4.8 oz), SpO2 99 %. No results found. No results for input(s): WBC, HGB, HCT, PLT in the last 72 hours.  Recent Labs  07/16/16 1011  NA 138  K 4.2  CL 104  GLUCOSE 130*  BUN 7  CREATININE 1.23  CALCIUM 8.6*   CBG (last 3)  No results for input(s): GLUCAP in the last 72 hours.  Wt Readings from Last 3 Encounters:  07/18/16 85 kg (187 lb 4.8 oz)  06/30/16 103.8 kg (228 lb 14.4 oz)  06/16/16 92.6 kg (204 lb 3.2 oz)    Physical Exam:  Constitutional: He is oriented to person, place, and time. He appears well-developed and well-nourished.  HENT:  Head: Normocephalic and atraumatic.  Eyes: Conjunctivae are normal. Pupils are equal, round, and reactive to light.  Neck: Normal range of motion. Neck supple.  Cardiovascular: Normal rate.  An irregular rhythm present. Exam reveals no gallop and no friction rub.   No murmur heard. Respiratory: Effort normal. No stridor. No respiratory distress. He has no wheezes. He has rhonchi. He has no rales.  GI: Soft. Bowel sounds are increased. There is no tenderness. There is no rebound and no guarding.  Mild distention  [id number] edema BLE Neurological: He is alert and oriented to person, place, and time.  Left facial weakness with minimal dysarthria and occasional wet voice. Flat affect with delayed processing and slow movements.Fair insight and awareness. He is able to follow simple one and two step motor commands.  Left sided weakness: LUW 3-/5  deltoid, bicep, tricep, wrist, hand, 3-/5 HF, KE,tr ADF/PF. Senses pain Left arm and leg.   Skin: Skin is warm and dry. No rash noted.  BLE with dry flaky skin.  Psychiatric: His affect is blunt. His speech is delayed. He is slowed.   Assessment/Plan: 1. 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 Physiatrist is providing close team supervision and 24 hour management of active medical problems listed below. Physiatrist and rehab team continue to assess barriers to discharge/monitor patient progress toward functional and medical goals.  Function:  Bathing Bathing position   Position: Wheelchair/chair at sink  Bathing parts Body parts bathed by patient: Right arm, Left arm, Chest, Abdomen, Right upper leg, Left upper leg (pt reports RN assisted with perineal area and buttocks ) Body parts bathed by helper: Back  Bathing assist Assist Level:  (Mod Assist)      Upper Body Dressing/Undressing Upper body dressing   What is the patient wearing?: Pull over shirt/dress     Pull over shirt/dress - Perfomed by patient: Thread/unthread right sleeve, Put head through opening Pull over shirt/dress - Perfomed by helper: Thread/unthread left sleeve, Pull shirt over trunk        Upper body assist Assist Level:  (Mod assist)      Lower Body Dressing/Undressing Lower body dressing   What is the patient wearing?: Pants, Ted Hose, Shoes   Underwear - Performed by helper:  Thread/unthread right underwear leg, Thread/unthread left underwear leg, Pull underwear up/down Pants- Performed by patient: Thread/unthread right pants leg Pants- Performed by helper: Thread/unthread left pants leg, Pull pants up/down, Thread/unthread right pants leg   Non-skid slipper socks- Performed by helper: Don/doff right sock, Don/doff left sock   Socks - Performed by helper: Don/doff  right sock, Don/doff left sock   Shoes - Performed by helper: Don/doff right shoe, Don/doff left shoe, Fasten right, Fasten left       TED Hose - Performed by helper: Don/doff right TED hose, Don/doff left TED hose  Lower body assist Assist for lower body dressing:  (Total assist)      Toileting Toileting Toileting activity did not occur: No continent bowel/bladder event Toileting steps completed by patient: Performs perineal hygiene Toileting steps completed by helper: Adjust clothing prior to toileting, Performs perineal hygiene, Adjust clothing after toileting Toileting Assistive Devices: Grab bar or rail  Toileting assist Assist level:  (Max assist)   Transfers Chair/bed transfer   Chair/bed transfer method: Lateral scoot Chair/bed transfer assist level: Maximal assist (Pt 25 - 49%/lift and lower) Chair/bed transfer assistive device: Armrests, Walker Mechanical lift: Ecologist     Max distance: 30 Assist level: Supervision or verbal cues   Wheelchair   Type: Manual Max wheelchair distance: 50 Assist Level: Moderate assistance (Pt 50 - 74%)  Cognition Comprehension Comprehension assist level: Follows basic conversation/direction with extra time/assistive device  Expression Expression assist level: Expresses basic needs/ideas: With extra time/assistive device  Social Interaction Social Interaction assist level: Interacts appropriately 75 - 89% of the time - Needs redirection for appropriate language or to initiate interaction.  Problem Solving Problem solving assist level: Solves basic 50 - 74% of the time/requires cueing 25 - 49% of the time  Memory Memory assist level: Recognizes or recalls 75 - 89% of the time/requires cueing 10 - 24% of the time   Medical Problem List and Plan: 1.  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  DVT Prophylaxis/Anticoagulation:    Lovenox 3. Pain Management: tylenol prn. Support limbs as needed to maintain joint position and prevent contracture 4. Mood: team to follow for ego support. SW to assess upon admit.  5. Neuropsych: This patient is not yet capable of making decisions on his own behalf. 6. Skin/Wound Care: encourage nutrition and pressure relief as needed 7. Fluids/Electrolytes/Nutrition: Fluid intake is okay according to nurse, 240 with breakfast this morning. 8. Afib: ASA daily             -metoprolol increased to 50mg  BID for better rate,  Vitals:   07/18/16 0544 07/18/16 0842  BP:  91/67  Pulse: 82 (!) 120  Resp:    Temp:                 Mild tachycardia . If blood pressures, above 120. Would increase beta blocker             -adjust regimen as needed 9. AKI: Creatinine looks to be at a baseline             -continue to push pos  -.  hypokalemia  likely due to diuretics, potassium , resolved with supplementation             - has been on spironolactone and potassium and Lasix at home.  Last potassium normal. On 10/12             -  labs indicate anemia of chronic disease             -stool hemoccult negative             -encourage appropriate nutrition  12. Cirrhosis of Liver.             -monitor LFT's and ammonia as needed                    -no evidence of encephalopathy. At the current time 58. HTN--metoprolol 14.  CHF ef 40-45% monitor for pulm edema,CXR, showing pulmonary edema as well as bibasilar atelectasis. No clear-cut infiltrate as of 07/09/2016, lower extremity edema resolved on diuretics. Reduce Lasix 20 mg Once a day and spironolactone 12.5 mg per day but will hold today due to low BP, no sign of reaccumulation  LOS (Days) 14 A FACE TO FACE EVALUATION WAS PERFORMED  Javayah Magaw E 07/18/2016 8:55 AM

## 2016-07-18 NOTE — Progress Notes (Signed)
Occupational Therapy Session Note  Patient Details  Name: Nathan Frank MRN: [id number] Date of Birth: June 16, 1941  Today's Date: 07/18/2016 OT Individual Time: 1030-1130 OT Individual Time Calculation (min): 60 min     Short Term Goals: Week 2:  OT Short Term Goal 1 (Week 2): Pt will complete toilet transfer with Min A OT Short Term Goal 2 (Week 2): Pt will complete UB dressing with Min A OT Short Term Goal 3 (Week 2): Pt will sequence 4/10 steps during bathing with mod vcs OT Short Term Goal 4 (Week 2): Pt will complete LB dressing with Mod A  Skilled Therapeutic Interventions/Progress Updates:    Treatment session with focus on initiation, attention to task, and sequencing of 1 and 2 step commands.  Completed bed mobility with supervision, min cues for initiation.  Pt with overall improved initiation throughout mobility and with dressing.  Pt continues to demonstrate decreased problem solving and visual perceptual deficits with orientation of shirt.  Pt required setup assist to orient shirt and provide assist to gather sleeves to increase success with donning shirt.  Pt able to thread Rt pant leg this session and demonstrated increased initiation with doffing socks to increase success with threading pants.  Improved initiation with sit > stand to pull pants over hips.  Oral care completed with setup assist to ensure application of toothpaste.  Engaged in [place] with focus on initiation, use of LUE, and standing balance.  Engaged in [place] x2 in sitting with reaction time 2.61 seconds and 3.0 seconds, with 1.25 second slower Lt compared to Rt.  Completed 2 trials in standing with pt requiring min assist - min guard for standing balance but reaction time decreased to 4.0 seconds both trials.  Returned to room and left semi-reclined in bed as pt reporting w/c and recliner uncomfortable.  Therapy Documentation Precautions:  Precautions Precautions: Fall Restrictions Weight Bearing  Restrictions: No General:   Vital Signs: Therapy Vitals Pulse Rate: (!) 120 BP: 91/67 Pain: Pain Assessment Pain Assessment: No/denies pain  See Function Navigator for Current Functional Status.   Therapy/Group: Individual Therapy  Simonne Come 07/18/2016, 12:09 PM

## 2016-07-19 ENCOUNTER — Inpatient Hospital Stay (HOSPITAL_COMMUNITY): Payer: Medicare Other | Admitting: Physical Therapy

## 2016-07-19 MED ORDER — FUROSEMIDE 20 MG PO TABS
10.0000 mg | ORAL_TABLET | Freq: Two times a day (BID) | ORAL | Status: DC
  Administered 2016-07-19 – 2016-07-20 (×2): 10 mg via ORAL
  Filled 2016-07-19 (×3): qty 1

## 2016-07-19 NOTE — Progress Notes (Signed)
Physical Therapy Session Note  Patient Details  Name: Nathan Frank MRN: [id number] Date of Birth: [birth year]  Today's Date: 07/19/2016 PT Individual Time: 0830-0930 PT Individual Time Calculation (min): 60 min    Short Term Goals: Week 2:  PT Short Term Goal 1 (Week 2): Pt will propel w/c 100' with supervision  PT Short Term Goal 2 (Week 2): Pt will transfer with min assist and LRAD PT Short Term Goal 3 (Week 2): Pt will ambulate 100' with LRAD and min assist PT Short Term Goal 4 (Week 2): Pt will initiate sit<>stand with min verbal cues in 50% of observed trials.   Skilled Therapeutic Interventions/Progress Updates:    Pt resting in bed on arrival, notes he spilled his orange juice in the bed, but no c/o pain and agreeable to therapy session.  Session focus on initiation, transfers, problem solving, gait with RW, and bed mobility in therapy apartment.  Pt transitions supine<>sit in hospital bed x2 with supervision using bed rails.  Supine<>sit on bed in therapy apartment x1 with supervision and increased time to complete task.  Sit<>stand from hospital bed, w/c x2, and bed in therapy apartment with increased time and supervision.  Gait training x23' with RW and supervision with verbal cues for posture.  Pt perseverating throughout session on needing to urinate, PT noted to pt several times that he had a condom cath on and could just proceed to void but pt reports he is unsuccessful and ultimately requesting to return to room.  Pt requesting to remove condom cath and attempt to use urinal, transition back to bed and sit<>stand to use urinal without success after several minutes attempting in standing and sitting.  Pt positioned in supine in bed at completion of session with call bell in reach and needs met.  RN aware of pt's status with urge to void and (-) results.   Therapy Documentation Precautions:  Precautions Precautions: Fall Restrictions Weight Bearing Restrictions: No   See  Function Navigator for Current Functional Status.   Therapy/Group: Individual Therapy  Nathan Frank 07/19/2016, 9:32 AM

## 2016-07-19 NOTE — Progress Notes (Signed)
[place] PHYSICAL MEDICINE & REHABILITATION     PROGRESS NOTE    Subjective/Complaints: Remains incontinent of bladder. Otherwise no complaints. No pain with urination.  ROS: Pt denies fever, rash/itching, headache, blurred or double vision, nausea, vomiting, abdominal pain, diarrhea, chest pain, shortness of breath, Objective: Vital Signs: Blood pressure 108/85, pulse (!) 120, temperature 97.7 F (36.5 C), temperature source Oral, resp. rate 16, height 6' (1.829 m), weight 83.9 kg (185 lb), SpO2 99 %. No results found. No results for input(s): WBC, HGB, HCT, PLT in the last 72 hours. No results for input(s): NA, K, CL, GLUCOSE, BUN, CREATININE, CALCIUM in the last 72 hours.  Invalid input(s): CO CBG (last 3)  No results for input(s): GLUCAP in the last 72 hours.  Wt Readings from Last 3 Encounters:  07/19/16 83.9 kg (185 lb)  06/30/16 103.8 kg (228 lb 14.4 oz)  06/16/16 92.6 kg (204 lb 3.2 oz)    Physical Exam:  Constitutional: He is oriented to person, place, and time. He appears well-developed and well-nourished.  HENT:  Head: Normocephalic and atraumatic.  Eyes: Conjunctivae are normal. Pupils are equal, round, and reactive to light.  Neck: Normal range of motion. Neck supple.  Cardiovascular: Normal rate.  An irregular rhythm present. Exam reveals no gallop and no friction rub.   No murmur heard. Respiratory: Effort normal. No stridor. No respiratory distress. He has no wheezes. He has rhonchi. He has no rales.  GI: Soft. Bowel sounds are increased. There is no tenderness. There is no rebound and no guarding.  Mild distention  [id number] edema BLE Neurological: He is alert and oriented to person, place, and time.  Left facial weakness with minimal dysarthria and occasional wet voice. Flat affect with delayed processing and slow movements.Fair insight and awareness. He is able to follow simple one and two step motor commands.  Left sided weakness: LUW 3-/5 deltoid, bicep,  tricep, wrist, hand, 3-/5 HF, KE,tr ADF/PF. Senses pain Left arm and leg.   Skin: Skin is warm and dry. No rash noted.  BLE with dry flaky skin.  Psychiatric: His affect is blunt. His speech is delayed. He is slowed.   Assessment/Plan: 1. 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 Physiatrist is providing close team supervision and 24 hour management of active medical problems listed below. Physiatrist and rehab team continue to assess barriers to discharge/monitor patient progress toward functional and medical goals.  Function:  Bathing Bathing position   Position: Wheelchair/chair at sink  Bathing parts Body parts bathed by patient: Right arm, Left arm, Chest, Abdomen, Right upper leg, Left upper leg (pt reports RN assisted with perineal area and buttocks ) Body parts bathed by helper: Back  Bathing assist Assist Level:  (Mod Assist)      Upper Body Dressing/Undressing Upper body dressing   What is the patient wearing?: Pull over shirt/dress     Pull over shirt/dress - Perfomed by patient: Thread/unthread right sleeve, Put head through opening, Pull shirt over trunk Pull over shirt/dress - Perfomed by helper: Thread/unthread left sleeve        Upper body assist Assist Level: Touching or steadying assistance(Pt > 75%)      Lower Body Dressing/Undressing Lower body dressing   What is the patient wearing?: Pants, Non-skid slipper socks   Underwear - Performed by helper: Thread/unthread right underwear leg, Thread/unthread left underwear leg, Pull underwear up/down  Pants- Performed by patient: Thread/unthread right pants leg, Pull pants up/down Pants- Performed by helper: Thread/unthread left pants leg Non-skid slipper socks- Performed by patient: Don/doff left sock Non-skid slipper socks- Performed by helper: Don/doff right sock   Socks -  Performed by helper: Don/doff right sock, Don/doff left sock   Shoes - Performed by helper: Don/doff right shoe, Don/doff left shoe, Fasten right, Fasten left       TED Hose - Performed by helper: Don/doff right TED hose, Don/doff left TED hose  Lower body assist Assist for lower body dressing:  (Mod assist)      Toileting Toileting Toileting activity did not occur: No continent bowel/bladder event Toileting steps completed by patient: Performs perineal hygiene Toileting steps completed by helper: Adjust clothing prior to toileting, Performs perineal hygiene, Adjust clothing after toileting Toileting Assistive Devices: Grab bar or rail  Toileting assist Assist level:  (Max assist)   Transfers Chair/bed transfer   Chair/bed transfer method: Ambulatory Chair/bed transfer assist level: Supervision or verbal cues Chair/bed transfer assistive device: Environmental consultant, Marine scientist lift: Ecologist     Max distance: 70 Assist level: Supervision or verbal cues   Wheelchair   Type: Manual Max wheelchair distance: 50 Assist Level: Moderate assistance (Pt 50 - 74%)  Cognition Comprehension Comprehension assist level: Follows basic conversation/direction with extra time/assistive device  Expression Expression assist level: Expresses basic needs/ideas: With extra time/assistive device  Social Interaction Social Interaction assist level: Interacts appropriately 75 - 89% of the time - Needs redirection for appropriate language or to initiate interaction.  Problem Solving Problem solving assist level: Solves basic 50 - 74% of the time/requires cueing 25 - 49% of the time  Memory Memory assist level: Recognizes or recalls 75 - 89% of the time/requires cueing 10 - 24% of the time   Medical Problem List and Plan: 1.  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  DVT Prophylaxis/Anticoagulation:    Lovenox 3. Pain Management: tylenol prn. Support limbs as needed to maintain joint position and prevent contracture 4. Mood: team to follow for ego support. SW to assess upon admit.  5. Neuropsych: This patient is not yet capable of making decisions on his own behalf. 6. Skin/Wound Care: encourage nutrition and pressure relief as needed 7. Fluids/Electrolytes/Nutrition: 25% of Breakfast today, variable 8. Afib: ASA daily             -metoprolol increased to 50mg  BID for better rate,  Vitals:   07/19/16 0500 07/19/16 0732  BP: 109/80 108/85  Pulse: (!) 117 (!) 120  Resp: 16   Temp: 97.7 F (36.5 C)                Mild tachycardia . If blood pressures, above 120. Would increase beta blocker             -adjust regimen as needed 9. AKI: Creatinine looks to be at a baseline             -continue to push pos  -.  hypokalemia  likely due to diuretics, potassium , resolved with supplementation             - has been on spironolactone and potassium and Lasix at home.  Last potassium normal. On 10/12,Recheck in a.m. given spironolactone             -labs indicate anemia of chronic disease             -  stool hemoccult negative             -encourage appropriate nutrition  12. Cirrhosis of Liver.             -monitor LFT's and ammonia as needed                    -no evidence of encephalopathy. At the current time 76. HTN--metoprolol 14.  CHF ef 40-45% monitor for pulm edema,CXR, showing pulmonary edema as well as bibasilar atelectasis. No clear-cut infiltrate as of 07/09/2016, lower extremity edema resolved on diuretics. Reduce Lasix10 mg twice a day and spironolactone 12.5 mg per day, goal is for blood pressure to run higher to allow beta blocker to increase LOS (Days) 15 A FACE TO FACE EVALUATION WAS PERFORMED  Alysia Penna E 07/19/2016 10:32 AM

## 2016-07-20 ENCOUNTER — Inpatient Hospital Stay (HOSPITAL_COMMUNITY): Payer: Self-pay | Admitting: Physical Therapy

## 2016-07-20 ENCOUNTER — Inpatient Hospital Stay (HOSPITAL_COMMUNITY): Payer: Medicare Other | Admitting: Speech Pathology

## 2016-07-20 ENCOUNTER — Encounter (HOSPITAL_COMMUNITY): Payer: Self-pay

## 2016-07-20 ENCOUNTER — Inpatient Hospital Stay (HOSPITAL_COMMUNITY): Payer: Medicare Other

## 2016-07-20 ENCOUNTER — Inpatient Hospital Stay (HOSPITAL_COMMUNITY): Payer: Medicare Other | Admitting: Occupational Therapy

## 2016-07-20 LAB — BASIC METABOLIC PANEL
ANION GAP: 6 (ref 5–15)
BUN: 13 mg/dL (ref 6–20)
CHLORIDE: 110 mmol/L (ref 101–111)
CO2: 21 mmol/L — AB (ref 22–32)
Calcium: 9 mg/dL (ref 8.9–10.3)
Creatinine, Ser: 1.29 mg/dL — ABNORMAL HIGH (ref 0.61–1.24)
GFR calc non Af Amer: 53 mL/min — ABNORMAL LOW (ref >60)
Glucose, Bld: 93 mg/dL (ref 65–99)
Potassium: 4.6 mmol/L (ref 3.5–5.1)
Sodium: 137 mmol/L (ref 135–145)

## 2016-07-20 NOTE — Progress Notes (Signed)
Speech Language Pathology Daily Session Note  Patient Details  Name: Nathan Frank MRN: [id number] Date of Birth: 05-03-1941  Today's Date: 07/20/2016 SLP Individual Time: 1100-1200 SLP Individual Time Calculation (min): 60 min   Short Term Goals: Week 2: SLP Short Term Goal 1 (Week 2): Patient will initiate functional tasks with no more than 2 repetition cues in 50% of given opportunities. SLP Short Term Goal 2 (Week 2): Patient will demonstrate functional problem solving during basic tasks given Min A verbal and visual cues.  SLP Short Term Goal 3 (Week 2): Patient will demonstrate intellectual awareness of 2 cognitive and 2 physical deficits given Mod A verbal cues.  SLP Short Term Goal 4 (Week 2): Patient will consume regular textures and thin liquids via straw during a meal, demonstrating efficient mastication and oral clearance with no overt s/s of aspiration. SLP Short Term Goal 5 (Week 2): Pt will utilize external aids to facilitate recall of daily information with min assist.   Skilled Therapeutic Interventions: Skilled treatment session focused on cognitive goals. SLP facilitated session by providing 2 cues for initiation of tasks in 50% of opportunities. Pt able to demonstrate functional problem solving during basic money task with Min A verbal cues. Pt demonstrated intellectual awareness of 2 physical deficits with Min A verbal cues and required Mod A verbal cues for recall of cognitive deficits. Pt was left in bed with call bell within reach and bed alarm set. Continue with current plan of care.   Function:    Cognition Comprehension Comprehension assist level: Follows basic conversation/direction with extra time/assistive device  Expression   Expression assist level: Expresses basic needs/ideas: With extra time/assistive device  Social Interaction Social Interaction assist level: Interacts appropriately 75 - 89% of the time - Needs redirection for appropriate language or to  initiate interaction.  Problem Solving Problem solving assist level: Solves basic 25 - 49% of the time - needs direction more than half the time to initiate, plan or complete simple activities  Memory Memory assist level: Recognizes or recalls 75 - 89% of the time/requires cueing 10 - 24% of the time    Pain Pain Assessment Pain Score: 0-No pain  Therapy/Group: Individual Therapy  Kenlei Safi 07/20/2016, 12:37 PM

## 2016-07-20 NOTE — Plan of Care (Signed)
Problem: RH PAIN MANAGEMENT Goal: RH STG PAIN MANAGED AT OR BELOW PT'S PAIN GOAL <3 Outcome: Progressing No c/o pain     

## 2016-07-20 NOTE — Plan of Care (Signed)
Problem: RH BLADDER ELIMINATION Goal: RH STG MANAGE BLADDER WITH ASSISTANCE STG Manage Bladder With Mod Assistance   Outcome: Not Progressing Requiring condom cath at [facility] to manage incontinence.

## 2016-07-20 NOTE — Plan of Care (Signed)
Problem: RH Wheelchair Mobility Goal: LTG Patient will propel w/c in controlled environment (PT) LTG: Patient will propel wheelchair in controlled environment, # of feet with assist (PT)  Outcome: Not Applicable Date Met: 35/45/62 D/C pt will not self propel Goal: LTG Patient will propel w/c in home environment (PT) LTG: Patient will propel wheelchair in home environment, # of feet with assistance (PT).  D/C goal as pt will not self propel

## 2016-07-20 NOTE — Progress Notes (Signed)
Physical Therapy Weekly Progress Note  Patient Details  Name: Nathan Frank MRN: [id number] Date of Birth: 12-25-1940  Beginning of progress report period: July 13, 2016 End of progress report period: July 20, 2016  Today's Date: 07/20/2016 PT Individual Time: 1500-1600 PT Individual Time Calculation (min): 60 min    Patient has met 3 of 4 short term goals.  Pt has made excellent progress this week with initiation and attention to task.  Pt currently performing at supervision level for transfers and gait and requiring close supervision/steady assist for dynamic standing balance.  Pt continues to require min>mod multimodal cues for sequencing familiar functional tasks.    Patient continues to demonstrate the following deficits: sequencing, balance, activity tolerance, and strength and therefore will continue to benefit from skilled PT intervention to enhance overall performance with activity tolerance, balance, postural control, attention, awareness and coordination.  Patient is progressing towards all long term goals except with w/c mobility.  Plan of care revisions: discontinued w/c goals as pt is not making progress with use of LUE in w/c propulsion and pt is ambulating with supervision.  PT Short Term Goals Week 2:  PT Short Term Goal 1 (Week 2): Pt will propel w/c 100' with supervision  PT Short Term Goal 1 - Progress (Week 2): Not met PT Short Term Goal 2 (Week 2): Pt will transfer with min assist and LRAD PT Short Term Goal 2 - Progress (Week 2): Met PT Short Term Goal 3 (Week 2): Pt will ambulate 100' with LRAD and min assist PT Short Term Goal 3 - Progress (Week 2): Met PT Short Term Goal 4 (Week 2): Pt will initiate sit<>stand with min verbal cues in 50% of observed trials.  PT Short Term Goal 4 - Progress (Week 2): Met Week 3:  PT Short Term Goal 1 (Week 3): =LTGs due to ELOS    Skilled Therapeutic Interventions/Progress Updates:    Pt received resting in bed, no c/o  pain, and agreeable to therapy session.  Supine>sit with supervision and bed rails and HOB elevated.  Stand/pivot with RW and supervision.  PT instructed pt in stair negotiation 2x4 steps with 2 rails with close supervision and intermittent steady assist when ascending.  Gait training x110' with RW and supervision with verbal cues for breathing and maintaining gait speed.  PT instructed pt in BUE therex with 2# dowel rod bicep curls, shoulder press, and chest press. Pt returned to room at end of session, stand/pivot w/c>recliner with RW and supervision.  Pt positioned to comfort with call bell in reach and needs met.   Therapy Documentation Precautions:  Precautions Precautions: Fall Restrictions Weight Bearing Restrictions: No General:   Vital Signs: Therapy Vitals Temp: 97.6 F (36.4 C) Temp Source: Oral Pulse Rate: (!) 128 (rn notified) Resp: 18 BP: 91/64 Patient Position (if appropriate): Lying Oxygen Therapy SpO2: 100 % O2 Device: Not Delivered Pain:   Mobility:   Locomotion :    Trunk/Postural Assessment :    Balance:   Exercises:   Other Treatments:     See Function Navigator for Current Functional Status.  Therapy/Group: Individual Therapy  Earnest Conroy Penven-Crew 07/20/2016, 4:34 PM

## 2016-07-20 NOTE — Progress Notes (Signed)
[place] PHYSICAL MEDICINE & REHABILITATION     PROGRESS NOTE    Subjective/Complaints: Remains incontinent of bladder. Otherwise no complaints. No pain with urination.  ROS: Pt denies fever, rash/itching, headache, blurred or double vision, nausea, vomiting, abdominal pain, diarrhea, chest pain, shortness of breath, Objective: Vital Signs: Blood pressure 91/64, pulse (!) 128, temperature 97.6 F (36.4 C), temperature source Oral, resp. rate 18, height 6' (1.829 m), weight 80.8 kg (178 lb 3.2 oz), SpO2 100 %. No results found. No results for input(s): WBC, HGB, HCT, PLT in the last 72 hours. No results for input(s): NA, K, CL, GLUCOSE, BUN, CREATININE, CALCIUM in the last 72 hours.  Invalid input(s): CO CBG (last 3)  No results for input(s): GLUCAP in the last 72 hours.  Wt Readings from Last 3 Encounters:  07/20/16 80.8 kg (178 lb 3.2 oz)  06/30/16 103.8 kg (228 lb 14.4 oz)  06/16/16 92.6 kg (204 lb 3.2 oz)    Physical Exam:  Constitutional: He is oriented to person, place, and time. He appears well-developed and well-nourished.  HENT:  Head: Normocephalic and atraumatic.  Eyes: Conjunctivae are normal. Pupils are equal, round, and reactive to light.  Neck: Normal range of motion. Neck supple.  Cardiovascular: Normal rate.  An irregular rhythm present. Exam reveals no gallop and no friction rub.   No murmur heard. Respiratory: Effort normal. No stridor. No respiratory distress. He has no wheezes. He has rhonchi. He has no rales.  GI: Soft. Bowel sounds are increased. There is no tenderness. There is no rebound and no guarding.  Mild distention  [id number] edema BLE Neurological: He is alert and oriented to person, place, and time.  Left facial weakness with minimal dysarthria and occasional wet voice. Flat affect with delayed processing and slow movements.Fair insight and awareness. He is able to follow simple one and two step motor commands.  Left sided weakness: LUW 3-/5  deltoid, bicep, tricep, wrist, hand, 3-/5 HF, KE,tr ADF/PF. Senses pain Left arm and leg.   Skin: Skin is warm and dry. No rash noted.  BLE with dry flaky skin.  Psychiatric: His affect is blunt. His speech is delayed. He is slowed.   Assessment/Plan: 1. 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 Physiatrist is providing close team supervision and 24 hour management of active medical problems listed below. Physiatrist and rehab team continue to assess barriers to discharge/monitor patient progress toward functional and medical goals.  Function:  Bathing Bathing position Bathing activity did not occur: Refused Position: Wheelchair/chair at sink  Bathing parts Body parts bathed by patient: Right arm, Left arm, Chest, Abdomen, Right upper leg, Left upper leg (pt reports RN assisted with perineal area and buttocks ) Body parts bathed by helper: Back  Bathing assist Assist Level:  (Mod Assist)      Upper Body Dressing/Undressing Upper body dressing   What is the patient wearing?: Pull over shirt/dress     Pull over shirt/dress - Perfomed by patient: Thread/unthread right sleeve, Thread/unthread left sleeve Pull over shirt/dress - Perfomed by helper: Put head through opening, Pull shirt over trunk        Upper body assist Assist Level:  (Mod assist)      Lower Body Dressing/Undressing Lower body dressing   What is the patient wearing?: Pants, Shoes, [employer] - Performed by helper: Thread/unthread right underwear leg, Thread/unthread left  underwear leg, Pull underwear up/down Pants- Performed by patient: Thread/unthread right pants leg, Thread/unthread left pants leg Pants- Performed by helper: Pull pants up/down Non-skid slipper socks- Performed by patient: Don/doff left sock Non-skid slipper socks- Performed by helper: Don/doff  right sock   Socks - Performed by helper: Don/doff right sock, Don/doff left sock Shoes - Performed by patient: Don/doff right shoe, Don/doff left shoe Shoes - Performed by helper: Fasten right, Fasten left       TED Hose - Performed by helper: Don/doff right TED hose, Don/doff left TED hose  Lower body assist Assist for lower body dressing:  (Mod assist)      Toileting Toileting Toileting activity did not occur: No continent bowel/bladder event Toileting steps completed by patient: Performs perineal hygiene Toileting steps completed by helper: Adjust clothing prior to toileting, Performs perineal hygiene, Adjust clothing after toileting Toileting Assistive Devices: Grab bar or rail  Toileting assist Assist level:  (Max assist)   Transfers Chair/bed transfer   Chair/bed transfer method: Ambulatory Chair/bed transfer assist level: Supervision or verbal cues Chair/bed transfer assistive device: Environmental consultant, Marine scientist lift: Ecologist     Max distance: 70 Assist level: Supervision or verbal cues   Wheelchair   Type: Manual Max wheelchair distance: 50 Assist Level: Moderate assistance (Pt 50 - 74%)  Cognition Comprehension Comprehension assist level: Follows basic conversation/direction with extra time/assistive device  Expression Expression assist level: Expresses basic needs/ideas: With extra time/assistive device  Social Interaction Social Interaction assist level: Interacts appropriately 75 - 89% of the time - Needs redirection for appropriate language or to initiate interaction.  Problem Solving Problem solving assist level: Solves basic 25 - 49% of the time - needs direction more than half the time to initiate, plan or complete simple activities  Memory Memory assist level: Recognizes or recalls 75 - 89% of the time/requires cueing 10 - 24% of the time   Medical Problem List and Plan: 1.  Left hemiparesis and funtctional deficits secondary to  right MCA embolic infarct  -Continue CIR PT, OT, Speech therapy, .2.  DVT Prophylaxis/Anticoagulation:   Lovenox 3. Pain Management: tylenol prn. Support limbs as needed to maintain joint position and prevent contracture 4. Mood: team to follow for ego support. SW to assess upon admit.  5. Neuropsych: This patient is not yet capable of making decisions on his own behalf. 6. Skin/Wound Care: encourage nutrition and pressure relief as needed 7. Fluids/Electrolytes/Nutrition:  variable 8. Afib: ASA daily             -metoprolol increased to 50mg  BID for better rate,  Vitals:   07/20/16 0830 07/20/16 1357  BP: 107/77 91/64  Pulse: (!) 126 (!) 128  Resp:  18  Temp:  97.6 F (36.4 C)               Mild tachycardia . If blood pressures, above 120. Would increase beta blocker             -adjust regimen as needed 9. AKI: Creatinine looks to be at a baseline             -continue to push pos  -.  hypokalemia  likely due to diuretics, potassium , resolved with supplementation             - has been on spironolactone and potassium and Lasix at home.  Last potassium normal. On 10/12,Recheck in a.m. given spironolactone  BMP Latest Ref Rng & Units 07/16/2016 07/13/2016 07/11/2016  Glucose 65 - 99 mg/dL 130(H) 119(H) 82  BUN 6 - 20 mg/dL 7 6 8   Creatinine 0.61 - 1.24 mg/dL 1.23 1.20 1.36(H)  Sodium 135 - 145 mmol/L 138 139 141  Potassium 3.5 - 5.1 mmol/L 4.2 3.9 3.2(L)  Chloride 101 - 111 mmol/L 104 105 109  CO2 22 - 32 mmol/L 25 22 22   Calcium 8.9 - 10.3 mg/dL 8.6(L) 8.5(L) 8.2(L)          12. Cirrhosis of Liver.             -monitor LFT's and ammonia as needed                    -no evidence of encephalopathy. At the current time 5. HTN--metoprolol, Aldactone 14.  CHF ef 40-45% monitor for pulm edema,CXR will order follow up  No clear-cut infiltrate as of 07/09/2016, lower extremity edema resolved on diuretics. Reduce Lasix10 mg twice a day and spironolactone 12.5 mg per day, goal is for blood  pressure to run higher to allow beta blocker to increase, Question of tachycardias related to increasing fluid volume. LOS (Days) [place]  Charlett Blake 07/20/2016 2:36 PM

## 2016-07-20 NOTE — Plan of Care (Signed)
Problem: RH Wheelchair Mobility Goal: LTG Patient will propel w/c in home environment (PT) LTG: Patient will propel wheelchair in home environment, # of feet with assistance (PT).  Outcome: Not Applicable Date Met: 82/50/03 D/C, pt will not use w/c in the home

## 2016-07-20 NOTE — Progress Notes (Signed)
Occupational Therapy Weekly Progress Note  Patient Details  Name: LONZIE SIMMER MRN: [id number] Date of Birth: [birth year]  Beginning of progress report period: July 13, 2016 End of progress report period: July 20, 2016  Today's Date: 07/20/2016 OT Individual Time: 0845-1000 OT Individual Time Calculation (min): 75 min    Patient has met 3 of 4 short term goals.  Pt is making steady progress towards goals.  Pt is demonstrating improvements in initiation with transfers and mobility and functional use of LUE.  Pt currently min guard with mobility with RW and transfers to/from w/c.  Pt continues to require mod-max assist with orientation of clothing due to visual perceptual deficits and decreased problem solving and awareness of deficits.  Pt continues to demonstrate decreased activity tolerance and endurance, requiring seated rest breaks throughout session.  Patient continues to demonstrate the following deficits: impaired cognition with decreased initiation, orientation, sequencing, Lt hemiparesis, visual perceptuatl deficits, impaired standing balance, and activity tolerance  and therefore will continue to benefit from skilled OT intervention to enhance overall performance with BADL and Reduce care partner burden.  Patient progressing toward long term goals..  Continue plan of care.  OT Short Term Goals Week 2:  OT Short Term Goal 1 (Week 2): Pt will complete toilet transfer with Min A OT Short Term Goal 1 - Progress (Week 2): Met OT Short Term Goal 2 (Week 2): Pt will complete UB dressing with Min A OT Short Term Goal 2 - Progress (Week 2): Progressing toward goal (met inconsistently) OT Short Term Goal 3 (Week 2): Pt will sequence 4/10 steps during bathing with mod vcs OT Short Term Goal 3 - Progress (Week 2): Met OT Short Term Goal 4 (Week 2): Pt will complete LB dressing with Mod A OT Short Term Goal 4 - Progress (Week 2): Met Week 3:  OT Short Term Goal 1 (Week 3): Pt will  complete UB dressing with min assist 4/5 times OT Short Term Goal 2 (Week 3): Pt will complete toilet transfers with RW with min assist OT Short Term Goal 3 (Week 3): Pt will complete bathing with min assist OT Short Term Goal 4 (Week 3): Pt will complete 1 grooming task in standing with supervision   Skilled Therapeutic Interventions/Progress Updates:    Treatment session with focus on initiation, problem solving, awareness, and functional use of LUE during self-care tasks.  Pt declined bathing this session, reporting nursing assisted with bathing.  Completed dressing from EOB with setup assist for clothing and orientation of shirt to thread BUE first after pt attempted donning with result of shirt just laying on shoulder.  Max cues for sequencing during UB dressing and intermittent hand over hand to thread successfully.  Pt with improved initiation and sequencing with donning pants this session and even donning shoes.  Stand pivot transfer bed > w/c with min assist.  Completed oral care with setup to open toothpaste and apply it.  Engaged in Hecker and dynamic standing balance in therapy gym with focus on sit > stand and reaching with LUE.  Utilized horse shoes with pt grasping with Lt hand and crossing midline to place outside BOS.  Pt required min assist during standing activity.  Multiple seated rest breaks throughout standing activity due to decreased endurance.  Attempted LUE Jessamine task with use of pegs in resistive peg board with pt unable to replicate pattern in picture.  Pt unable to identify errors when asked.  Returned to room and left upright in  w/c.  Therapy Documentation Precautions:  Precautions Precautions: Fall Restrictions Weight Bearing Restrictions: No General:   Vital Signs: Therapy Vitals Temp: 97.7 F (36.5 C) Temp Source: Oral Pulse Rate: (!) 126 BP: 107/77 Patient Position (if appropriate): Sitting Oxygen Therapy SpO2: 100 % O2 Device: Not Delivered Pain: Pain  Assessment Pain Assessment: No/denies pain ADL: ADL ADL Comments: Please see functional navigator for ADL levels Exercises:   Other Treatments:    See Function Navigator for Current Functional Status.   Therapy/Group: Individual Therapy  Simonne Come 07/20/2016, 9:49 AM

## 2016-07-20 NOTE — Plan of Care (Signed)
Problem: RH BLADDER ELIMINATION Goal: RH STG MANAGE BLADDER WITH ASSISTANCE STG Manage Bladder With Mod I Assistance   Outcome: Progressing Continent during the day, request condom cath @[facility] .

## 2016-07-21 ENCOUNTER — Inpatient Hospital Stay (HOSPITAL_COMMUNITY): Payer: Self-pay | Admitting: Physical Therapy

## 2016-07-21 ENCOUNTER — Inpatient Hospital Stay (HOSPITAL_COMMUNITY): Payer: Medicare Other | Admitting: Occupational Therapy

## 2016-07-21 ENCOUNTER — Inpatient Hospital Stay (HOSPITAL_COMMUNITY): Payer: Medicare Other | Admitting: Speech Pathology

## 2016-07-21 DIAGNOSIS — I471 Supraventricular tachycardia: Secondary | ICD-10-CM

## 2016-07-21 MED ORDER — METOPROLOL TARTRATE 50 MG PO TABS
50.0000 mg | ORAL_TABLET | Freq: Three times a day (TID) | ORAL | Status: DC
  Administered 2016-07-21 – 2016-07-30 (×23): 50 mg via ORAL
  Filled 2016-07-21 (×26): qty 1

## 2016-07-21 MED ORDER — FUROSEMIDE 20 MG PO TABS
10.0000 mg | ORAL_TABLET | Freq: Every day | ORAL | Status: DC
  Administered 2016-07-22 – 2016-07-27 (×6): 10 mg via ORAL
  Filled 2016-07-21 (×6): qty 1

## 2016-07-21 NOTE — Progress Notes (Signed)
Speech Language Pathology Weekly Progress and Session Note  Patient Details  Name: Nathan Frank MRN: [id number] Date of Birth: 06-Jan-1941  Beginning of progress report period: July 13, 2016  End of progress report period: July 21, 2016   Today's Date: 07/21/2016 SLP Individual Time: 1445-1545 SLP Individual Time Calculation (min): 60 min   Short Term Goals: Week 2: SLP Short Term Goal 1 (Week 2): Patient will initiate functional tasks with no more than 2 repetition cues in 50% of given opportunities. SLP Short Term Goal 1 - Progress (Week 2): Met SLP Short Term Goal 2 (Week 2): Patient will demonstrate functional problem solving during basic tasks given Min A verbal and visual cues.  SLP Short Term Goal 2 - Progress (Week 2): Met SLP Short Term Goal 3 (Week 2): Patient will demonstrate intellectual awareness of 2 cognitive and 2 physical deficits given Mod A verbal cues.  SLP Short Term Goal 3 - Progress (Week 2): Met SLP Short Term Goal 4 (Week 2): Patient will consume regular textures and thin liquids via straw during a meal, demonstrating efficient mastication and oral clearance with no overt s/s of aspiration. SLP Short Term Goal 4 - Progress (Week 2): Met SLP Short Term Goal 5 (Week 2): Pt will utilize external aids to facilitate recall of daily information with min assist.  SLP Short Term Goal 5 - Progress (Week 2): Progressing toward goal    New Short Term Goals: Week 3: SLP Short Term Goal 1 (Week 3): Patient will initiate functional tasks with no more than 2 repetition cues in 75% of given opportunities. SLP Short Term Goal 2 (Week 3): Patient will demonstrate functional problem solving during basic tasks given Min A verbal cues.  SLP Short Term Goal 3 (Week 3): Patient will demonstrate intellectual awareness of 2 cognitive and 2 physical deficits given Min A verbal cues.  SLP Short Term Goal 4 (Week 3): Pt will utilize external aids to facilitate recall of daily  information with min assist.   Weekly Progress Updates: Pt has made functional gains this reporting period and has met 4 out of 5 short term goals.  Pt is at an overall min-mod assist level for basic tasks due to moderate cognitive impairment.  Pt has demonstrated improved task initiation and functional problem solving during tasks this reporting period and his diet has been upgraded to regular textures and thin liquids with mod I use of swallowing precautions. Of note, pt did require increased assistance today for task initiation and sustained attention to task due to lethargy with tachycardia and is pending cardiologic work up but has consistently met the abovementioned goals prior to today over the last reporting period.  Pt would continue to benefit from skilled ST while inpatient in order to maximize functional independence and reduce burden of care prior to discharge.  No family has been present for training this reporting period.   Intensity: Minumum of 1-2 x/day, 30 to 90 minutes Frequency: 3 to 5 out of 7 days Duration/Length of Stay: 20-24 days Treatment/Interventions: Cognitive remediation/compensation;Cueing hierarchy;Dysphagia/aspiration precaution training;Environmental controls;Functional tasks;Internal/external aids;Medication managment;Patient/family education;Speech/Language facilitation;Therapeutic Activities   Daily Session  Skilled Therapeutic Interventions: Pt was seen for skilled ST targeting cognitive goals.  Pt requesting to use the bathroom upon arrival and needed max assist multimodal cues for initiation of transfer to wheelchair.  Pt also needed increased cuing for task sequencing, attention to left upper extremity, and sustained attention to basic self care tasks in comparison to recent therapy sessions,  up to max assist verbal, visual and tactile cues.  Spoke with RN about change in function who reports tachycardia and fatigue since this morning.  Pt pending further medical  work up from cardiology.  Pt left in bed at the end of today's therapy session with bed alarm set and call bell within reach.  Goals updated on this date to reflect current progress and plan of care.       Function:   Eating Eating     Eating Assist Level: Set up assist for   Eating Set Up Assist For: Opening containers       Cognition Comprehension Comprehension assist level: Understands basic 90% of the time/cues < 10% of the time  Expression   Expression assist level: Expresses basic 90% of the time/requires cueing < 10% of the time.  Social Interaction Social Interaction assist level: Interacts appropriately 75 - 89% of the time - Needs redirection for appropriate language or to initiate interaction.  Problem Solving Problem solving assist level: Solves basic 50 - 74% of the time/requires cueing 25 - 49% of the time  Memory Memory assist level: Recognizes or recalls 25 - 49% of the time/requires cueing 50 - 75% of the time   General    Pain Pain Assessment Pain Assessment: No/denies pain  Therapy/Group: Individual Therapy  Gerardo Territo, Selinda Orion 07/21/2016, 4:40 PM

## 2016-07-21 NOTE — Consult Note (Signed)
  NEUROBEHAVIORAL STATUS EXAM - [facility]   MEDICAL NECESSITY:  Nathan Frank was seen on the [facility] for a neurobehavioral status exam owing to the patient's diagnosis of CVA, and to assist in treatment planning during admission.   Records indicate that Nathan Frank is a "75 y.o. RH-male with history of Afib, alcoholic cirrhosis with ascites/thrombocytopenia, GIB, polysubstance abuse, Hep C, hepatocellular CA, A fib (no anticoagulation due to non-compliance),  most recent admission for acute on chronic CHF. He was admitted on 06/25/16 after being found on the floor with left sided weakness, right gaze preference and facial droop with dysarthria.  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   He underwent cerebral angio with thrombectomy of R-MCA clot and IA Integrilin. 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 2D echo with EF 40-45% and mild MVR.    He tolerated extubation on 09/23 but noted to have weak cough with difficulty handling secretions. He developed fever and was started on IV Vanc/Zosyn. .Infarct felt to be embolic due to unknown source and patient placed on ASA for secondary stroke prevention.Patient with resultant left sided weakness, dysarthria and cognitive deficits affecting mobility and self-care.  CIR recommended."   During today's visit, Nathan Frank reported suffering from post-stroke memory loss that he feels is improving. He also experiences left upper motor deficit. No other cognitive, motor, sensory, or behavioral issues endorsed.   From an emotional standpoint, Nathan Frank described his current mood as "good." He has no history of being diagnosed with or treated for any mental health condition. He denied ever experiencing any prolonged periods of depression or anxiety throughout his life. He has been adjusting well to this  admission. Suicidal/homicidal ideation, plan or intent was denied. No manic or hypomanic episodes were reported. The patient denied ever experiencing any auditory/visual hallucinations. No major behavioral or personality changes were endorsed.   Nathan Frank feels that he has been making progress in therapy. No major barriers to therapy noted. He described the rehab staff as treating him "very well." He has ample support via his wife, daughter, and grandson. He was living with his wife prior to this admission and plans to return to that situation upon discharge. He believes that she will be able to assist with his transfer home and his care.   PROCEDURES: [2 units 96116] Diagnostic clinical interview  Review of available records MINI-[place]'s Cognitive Examination   IMPRESSION: Overall, Nathan Frank endorsed experiencing post-stroke memory loss but no other cognitive issues. Brief screen indicated mildly reduced learning but intact memory, though marked issues with certain visuoperception abilities as well as slowed processing speed were observed. I recommend that this patient undergo comprehensive neuropsychological evaluation upon discharge to better ascertain cognitive strengths and challenges, as the stroke as well as multiple facets of his medical history could be playing a role in cognitive deficits that may impact his daily life. There are no clear indications of significant clinical psychopathology. I do not feel that neuropsychology needs to follow Nathan Frank for the remainder of this admission.   DIAGNOSES:  CVA Unspecified Cognitive Disorder   Nathan Frank, Psy.D., ABN Board-Certified Clinical Neuropsychologist

## 2016-07-21 NOTE — Progress Notes (Signed)
Physical Therapy Session Note  Patient Details  Name: Nathan Frank MRN: [id number] Date of Birth: [birth year]  Today's Date: 07/21/2016 PT Individual Time: 1000-1030 PT Individual Time Calculation (min): 30 min    Short Term Goals: Week 3:  PT Short Term Goal 1 (Week 3): =LTGs due to ELOS   Skilled Therapeutic Interventions/Progress Updates:    Pt resting in bed upon arrival with no c/o pain and agreeable to therapy session.  Pt states he is tired because he didn't return from Chest Xray until midnight and had labs drawn at 4 am.  Pt with significant decline in cognitive status today requiring max to total cues to stay alert, initiate, and attend to task.  Attempted supine>sit for dressing EOB, pt requiring multiple attempts and ultimately mod assist to complete sequencing to sit EOB as he would get halfway and then lie back down.  When asked (multiple times before he responded) what he was doing he responded "nothing?" Attempted upper body dressing but pt unable to sequence threading UEs through sleeves and again laid back down on the bed.  Again therapist had to ask multiple times why he was lying down before pt responded, "I'm just tired."  PT noted pt's condom catheter to be off and brief/draw sheet to be wet.  Pt required total assist for rolling to R and steady assist but max multimodal cues to roll to L for doffing soiled brief, hygiene, donning clean brief, and placement of clean/dry draw sheet.  Pt also with decreased awareness of positioning of LUE in functional mobility throughout session today frequently responding to cues to lower his arm with "I did," though he hadn't moved it.  Pt left supine in bed with call bell in reach and needs met, RN aware through initial part of session and updating on pt's change in status at completion of session. PA also aware.   Therapy Documentation Precautions:  Precautions Precautions: Fall Restrictions Weight Bearing Restrictions:  No General: PT Amount of Missed Time (min): 30 Minutes PT Missed Treatment Reason: Other (Comment) (EKG, pt LOC)   See Function Navigator for Current Functional Status.   Therapy/Group: Individual Therapy  Alira Fretwell E Penven-Crew 07/21/2016, 10:59 AM

## 2016-07-21 NOTE — Progress Notes (Signed)
[facility]     PROGRESS NOTE    Subjective/Complaints: No chest pains or shortness of breath. He is aware of his increased heart rate, but does not feel bad. Appreciate psychology note. We discussed chest x-ray results.  ROS: Pt denies fever, rash/itching, headache, blurred or double vision, nausea, vomiting, abdominal pain, diarrhea, chest pain, shortness of breath, Objective: Vital Signs: Blood pressure 109/77, pulse (!) 135, temperature 98 F (36.7 C), temperature source Oral, resp. rate 18, height 6' (1.829 m), weight 80 kg (176 lb 5.9 oz), SpO2 100 %. Dg Chest 2 View  Result Date: 07/21/2016 CLINICAL DATA:  75 year old male with pulmonary edema. EXAM: CHEST  2 VIEW COMPARISON:  Chest radiograph dated 07/09/2016 FINDINGS: Two views of the chest demonstrate clear lungs. There is no pleural effusion or pneumothorax. Top-normal cardiac silhouette. There is osteopenia with degenerative changes of the spine. No acute fracture. IMPRESSION: No acute cardiopulmonary process. Interval resolution of the previously seen vascular congestion. Electronically Signed   By: Anner Crete M.D.   On: 07/21/2016 05:25   No results for input(s): WBC, HGB, HCT, PLT in the last 72 hours.  Recent Labs  07/20/16 1846  NA 137  K 4.6  CL 110  GLUCOSE 93  BUN 13  CREATININE 1.29*  CALCIUM 9.0   CBG (last 3)  No results for input(s): GLUCAP in the last 72 hours.  Wt Readings from Last 3 Encounters:  07/21/16 80 kg (176 lb 5.9 oz)  06/30/16 103.8 kg (228 lb 14.4 oz)  06/16/16 92.6 kg (204 lb 3.2 oz)    Physical Exam:  Constitutional: He is oriented to person, place, and time. He appears well-developed and well-nourished.  HENT:  Head: Normocephalic and atraumatic.  Eyes: Conjunctivae are normal. Pupils are equal, round, and reactive to light.  Neck: Normal range of motion. Neck supple.  Cardiovascular: Normal rate.  An irregular rhythm present. Exam reveals  no gallop and no friction rub.   No murmur heard. Respiratory: Effort normal. No stridor. No respiratory distress. He has no wheezes. He has rhonchi. He has no rales.  GI: Soft. Bowel sounds are increased. There is no tenderness. There is no rebound and no guarding.  Mild distention  [id number] edema BLE Neurological: He is alert and oriented to person, place, and time.  Left facial weakness with minimal dysarthria and occasional wet voice. Flat affect with delayed processing and slow movements.Fair insight and awareness. He is able to follow simple one and two step motor commands.  Left sided weakness: LUW 3-/5 deltoid, bicep, tricep, wrist, hand, 3-/5 HF, KE,tr ADF/PF. Senses pain Left arm and leg.   Skin: Skin is warm and dry. No rash noted.  BLE with dry flaky skin.  Psychiatric: His affect is blunt. His speech is delayed. He is slowed.   Assessment/Plan: 1. 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 Physiatrist is providing close team supervision and 24 hour management of active medical problems listed below. Physiatrist and rehab team continue to assess barriers to discharge/monitor patient progress toward functional and medical goals.  Function:  Bathing Bathing position Bathing activity did not occur: Refused Position: Wheelchair/chair at sink  Bathing parts Body parts bathed by patient: Right arm, Left arm, Chest, Abdomen, Right upper leg, Left upper leg (pt reports RN assisted with perineal area and buttocks ) Body parts bathed by helper: Back  Bathing assist Assist Level:  (Mod Assist)      Upper Body Dressing/Undressing Upper body dressing   What is the patient wearing?: Pull over shirt/dress     Pull over shirt/dress - Perfomed by patient: Thread/unthread right sleeve, Thread/unthread left sleeve Pull over shirt/dress - Perfomed by  helper: Put head through opening, Pull shirt over trunk        Upper body assist Assist Level:  (Mod assist)      Lower Body Dressing/Undressing Lower body dressing   What is the patient wearing?: Pants, Shoes, [employer] - Performed by helper: Thread/unthread right underwear leg, Thread/unthread left underwear leg, Pull underwear up/down Pants- Performed by patient: Thread/unthread right pants leg, Thread/unthread left pants leg Pants- Performed by helper: Pull pants up/down Non-skid slipper socks- Performed by patient: Don/doff left sock Non-skid slipper socks- Performed by helper: Don/doff right sock   Socks - Performed by helper: Don/doff right sock, Don/doff left sock Shoes - Performed by patient: Don/doff right shoe, Don/doff left shoe Shoes - Performed by helper: Fasten right, Fasten left       TED Hose - Performed by helper: Don/doff right TED hose, Don/doff left TED hose  Lower body assist Assist for lower body dressing:  (Mod assist)      Toileting Toileting Toileting activity did not occur: No continent bowel/bladder event Toileting steps completed by patient: Performs perineal hygiene Toileting steps completed by helper: Adjust clothing prior to toileting, Performs perineal hygiene, Adjust clothing after toileting Toileting Assistive Devices: Grab bar or rail  Toileting assist Assist level:  (Max assist)   Transfers Chair/bed transfer   Chair/bed transfer method: Stand pivot Chair/bed transfer assist level: Supervision or verbal cues Chair/bed transfer assistive device: Armrests, Environmental manager lift: Ecologist     Max distance: 110 Assist level: Supervision or verbal cues   Wheelchair   Type: Manual Max wheelchair distance: 50 Assist Level: Moderate assistance (Pt 50 - 74%)  Cognition Comprehension Comprehension assist level: Follows basic conversation/direction with extra time/assistive device  Expression Expression  assist level: Expresses basic 90% of the time/requires cueing < 10% of the time.  Social Interaction Social Interaction assist level: Interacts appropriately 75 - 89% of the time - Needs redirection for appropriate language or to initiate interaction.  Problem Solving Problem solving assist level: Solves basic 25 - 49% of the time - needs direction more than half the time to initiate, plan or complete simple activities  Memory Memory assist level: Recognizes or recalls 50 - 74% of the time/requires cueing 25 - 49% of the time   Medical Problem List and Plan: 1.  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  DVT Prophylaxis/Anticoagulation:   Lovenox 3. Pain Management: tylenol prn. Support limbs as needed to maintain joint position and prevent contracture 4. Mood: team to follow for ego support. SW to assess upon admit.  5. Neuropsych: This patient is not yet capable of making decisions on his own behalf. 6. Skin/Wound Care: encourage nutrition and pressure relief as needed 7. Fluids/Electrolytes/Nutrition:  variable 8. Afib: ASA daily             -metoprolol increased to 50mg  BID for better rate, However, heart rate continues to increase. Has seen Dr. Harrington Challenger from cardiology. Will ask for consult Vitals:   07/21/16 0832 07/21/16 0835  BP: 98/71 109/77  Pulse: (!) 138 (!) 135  Resp:  Temp:                 Mild tachycardia . If blood pressures, above 120. Would increase beta blocker             -adjust regimen as needed 9. AKI: Creatinine looks to be at a baseline             -continue to push pos  -.  hypokalemia  likely due to diuretics, potassium , resolved with supplementation             - has been on spironolactone and potassium and Lasix at home.  Last potassium normal. On 10/12,Recheck in a.m. given spironolactone  BMP Latest Ref Rng & Units 07/20/2016 07/16/2016 07/13/2016  Glucose 65 - 99 mg/dL 93 130(H)  119(H)  BUN 6 - 20 mg/dL 13 7 6   Creatinine 0.61 - 1.24 mg/dL 1.29(H) 1.23 1.20  Sodium 135 - 145 mmol/L 137 138 139  Potassium 3.5 - 5.1 mmol/L 4.6 4.2 3.9  Chloride 101 - 111 mmol/L 110 104 105  CO2 22 - 32 mmol/L 21(L) 25 22  Calcium 8.9 - 10.3 mg/dL 9.0 8.6(L) 8.5(L)          12. Cirrhosis of Liver.             -monitor LFT's and ammonia as needed                    -no evidence of encephalopathy. At the current time 28. HTN--metoprolol, Aldactone 14.  CHF ef 40-45% monitor for pulm edema,CXR will order follow up  No clear-cut infiltrate as of 07/09/2016, lower extremity edema resolved on diuretics. Reduce Lasix10 mg twice a day and spironolactone 12.5 mg per day, goal is for blood pressure to run higher to allow beta blocker to increase, Question of tachycardias related to increasing fluid volume.  We'll ask cardiology to consult on tachycardia LOS (Days) [place] E 07/21/2016 10:03 AM

## 2016-07-21 NOTE — Progress Notes (Signed)
Occupational Therapy Session Note  Patient Details  Name: Nathan Frank MRN: [id number] Date of Birth: [birth year]  Today's Date: 07/21/2016 OT Individual Time: [id number] and 1130-1200 OT Individual Time Calculation (min): 45 min and 30 min  OT Missed Time: 15 Minutes Missed Time Reason: Patient fatigue     Short Term Goals: Week 3:  OT Short Term Goal 1 (Week 3): Pt will complete UB dressing with min assist 4/5 times OT Short Term Goal 2 (Week 3): Pt will complete toilet transfers with RW with min assist OT Short Term Goal 3 (Week 3): Pt will complete bathing with min assist OT Short Term Goal 4 (Week 3): Pt will complete 1 grooming task in standing with supervision  Skilled Therapeutic Interventions/Progress Updates:    1) Treatment session with focus on attention to task, activity tolerance, and sitting balance.  Pt received in bed easily awakened to name.  Pt reports not sleeping well over night, but wanting to sit up and eat.  Pt with decreased awareness and problem solving this session, with difficulty attending to task and remaining alert.  Pt attempted to eat muffin with paper still on muffin, also attempted to drink juice with lid still on with no awareness.  Therapist reoriented pt to situation and opened juice container and removed paper from muffin.  Pt with decreased sitting balance, requiring mod assist to maintain upright sitting balance at EOB.  Pt returning to bed frequently during session.  Notified RN of difference in behavior, low BP, and elevated HR.  Pt returned to bed and left to rest until next session.  2) Treatment session with focus on attention to task and Lt attention.  Pt received in bed arousing but reporting not wanting to leave the bed this session.  Engaged in matching activity with focus on Lt attention and memory.  Attempted for pt to match cards in field of 6 from memory, pt with decreased success with this task.  Downgraded activity to cards face up with  pt having to match them.  Pt able to match 9 of 9 cards with increased time and cues, demonstrating decreased attention to Lt and decreased sustained attention this session.  Alerted RN of pt still different this session, awaiting cardiology.  Therapy Documentation Precautions:  Precautions Precautions: Fall Restrictions Weight Bearing Restrictions: No General: General OT Amount of Missed Time: 15 Minutes Vital Signs: Therapy Vitals Temp: 98 F (36.7 C) Temp Source: Oral Pulse Rate: (!) 135 Resp: 18 BP: 109/77 Patient Position (if appropriate): Sitting Oxygen Therapy SpO2: 100 % O2 Device: Not Delivered Pain:  Pt with no c/o pain  See Function Navigator for Current Functional Status.   Therapy/Group: Individual Therapy  Simonne Come 07/21/2016, 8:56 AM

## 2016-07-21 NOTE — Progress Notes (Signed)
Obvious blood noted in stool. Condom cath in place per patient's request.Andrej Spagnoli, Roselyn Reef A

## 2016-07-21 NOTE — Consult Note (Signed)
Patient ID: Nathan Frank MRN: [id number] DOB/AGE: [birth year] 75 y.o.  Admit date: 07/04/2016 Referring Physician: Ella Bodo Primary Physician: Thressa Sheller, MD Primary Cardiologist: Harrington Challenger Reason for Consultation: Tachycardia  HPI: 75 yo male with history of atrial fibrillation (not on anti-coagulation due to history of GI bleeding), RBBB, SVT, liver cirrhosis, etoh abuse, heroin abuse, marijuana abuse, DVT, medication non-compliance who we are asked to see today for evaluation of tachycardia. He was admitted 06/09/16 with diarrhea, chest pain and atrial fib with RVR. He is known to have normal coronary arteries by cath in July 2016. He had been admitted July 2017 with SVT and started on amiodarone but he did not take this. During the most recent admission 06/09/16 he was seen by Dr. Harrington Challenger and was started on a beta blocker. Amiodarone was not restarted due to prior thyroid issues and non-compliance. He was not anti-coagulated due to history of non-compliance, etoh abuse and ulcers). He was diuresed for mild volume overload. He was readmitted 06/25/16 with acute CVA. Hedidnot receive IV t-PA due to delay in arrival. He was taken to IR where he received revascularization of occluded R MCA. He was transferred to Inpatient rehab unit 07/03/16.   He has continued tachycardia.  He denies any sensation of palpitations. He denies chest pain or dyspnea. No LE edema.    Past Medical History:  Diagnosis Date  . Abnormal TSH   . Acute gastric ulcer   . Acute gastritis with hemorrhage   . Alcohol abuse   . Alcohol dependence ([place]) 02/02/2014  . Anemia   . CHF (congestive heart failure) ([place])   . Cirrhosis ([place]) 06/04/2012  . Depressive disorder 02/01/2014  . Dizziness and giddiness 12/13/2014  . DVT of lower limb, acute ([place]) 06/06/2012  . Essential hypertension   . Fatty liver   . Gallstones   . Gastritis 06/07/2012  . GERD (gastroesophageal reflux disease)   . GI bleed due to NSAIDs 12/13/2014  .  Granulomatous gastritis   . Hematuria 06/04/2012  . Hepatitis C   . Hepatocellular carcinoma ([place])   . Heroin abuse    "I haven't done that since I don't know when."  . Heroin overdose 02/20/2014  . LV dysfunction    a. 04/2015: EF 45-50% by cath.  . Neuropathy ([place])   . NSTEMI (non-ST elevated myocardial infarction) ([place])    a. 04/2015 - patent coronaries. Etiology possibly due to coronary spasm versus embolus, stress cardiomyopathy (atypical), and aborted infarction related to plaque rupture with thrombosis and dissolution. Amlodipine started. Not on antiplatelets due to GIB/cirrhosis history.  . Oral thrush 06/05/2012  . Polysubstance abuse    Rare marijuana.  No EtOH x 2 months.   . Prolonged Q-T interval on ECG    a. 12/2014 - treated with magnesium.  . Right knee pain 12/13/2014  . S/P alcohol detoxification 02/02/2014  . SVT (supraventricular tachycardia) ([place])    a. 12/2014 in setting of GIB, ETOH, NSAIDS, gastritis.  . Symptomatic cholelithiasis 12/15/2013  . Thrombocytopenia ([place])   . Upper GI bleeding 12/13/2014  . Weight loss 06/04/2012    Family History  Problem Relation Age of Onset  . Stroke Father   . Prostate cancer Brother   . Heart disease Mother     Pacemaker    Social History   Social History  . Marital status: Married    Spouse name: N/A  . Number of children: 3  . Years of education: N/A   Occupational History  .  Retired Education officer, [employer]    Social History Main Topics  . Smoking status: Former Smoker    Packs/day: 0.50    Years: 5.00    Types: Cigarettes  . Smokeless tobacco: Never Used     Comment: "quit smoking cigarettes in the 1970's"  . Alcohol use 16.8 oz/week    28 Glasses of wine per week     Comment: 06/2016 " I quit drinking a couple of weeks ago "  . Drug use: No  . Sexual activity: Not Currently    Birth control/ protection: Condom   Other Topics Concern  . Not on file   Social History Narrative   Lost one son to a gunshot.  Lives with  wife.      Past Surgical History:  Procedure Laterality Date  . CARDIAC CATHETERIZATION N/A 04/15/2015   Procedure: Left Heart Cath and Coronary Angiography;  Surgeon: Belva Crome, MD;  Location: [facility];  Service: Cardiovascular;  Laterality: N/A;  . CIRCUMCISION    . ESOPHAGOGASTRODUODENOSCOPY  06/07/2012   Procedure: ESOPHAGOGASTRODUODENOSCOPY (EGD);  Surgeon: Milus Banister, MD;  Location: [facility];  Service: Endoscopy;  Laterality: N/A;  may need treatment of varices  . ESOPHAGOGASTRODUODENOSCOPY N/A 12/14/2014   Procedure: ESOPHAGOGASTRODUODENOSCOPY (EGD);  Surgeon: Jerene Bears, MD;  Location[facility];  Service: Endoscopy;  Laterality: N/A;  . IR GENERIC HISTORICAL  06/25/2016   IR US GUIDE VASC ACCESS LEFT 06/25/2016 Corrie Mckusick, DO [facility]  . IR GENERIC HISTORICAL  06/25/2016   IR RADIOLOGY PERIPHERAL GUIDED IV START 06/25/2016 Corrie Mckusick, DO [facility]  . IR GENERIC HISTORICAL  06/25/2016   IR PERCUTANEOUS ART THROMBECTOMY/INFUSION INTRACRANIAL INC DIAG ANGIO 06/25/2016 Luanne Bras, MD [facility]  . RADIOLOGY WITH ANESTHESIA N/A 06/25/2016   Procedure: RADIOLOGY WITH ANESTHESIA;  Surgeon: Luanne Bras, MD;  Location: [facility];  Service: Radiology;  Laterality: N/A;  . TONSILLECTOMY      No Known Allergies  Hospital medications: . aspirin  325 mg Oral Daily  . chlorhexidine  15 mL Mouth Rinse BID  . enoxaparin (LOVENOX) injection  40 mg Subcutaneous Q24H  . [START ON 07/22/2016] furosemide  10 mg Oral Daily  . levothyroxine  50 mcg Per Tube QAC breakfast  . metoprolol tartrate  50 mg Oral BID  . multivitamin with minerals  1 tablet Oral Daily  . potassium chloride  20 mEq Oral TID  . spironolactone  12.5 mg Oral Daily  . thiamine  100 mg Per Tube Daily    Review of systems complete and found to be negative unless listed above    Physical Exam: Blood pressure 101/64, pulse (!) 124, temperature 98.5 F (36.9 C), temperature source  Oral, resp. rate 18, height 6' (1.829 m), weight 176 lb 5.9 oz (80 kg), SpO2 100 %.    General: Well developed, well nourished, NAD  HEENT: OP clear, mucus membranes moist  SKIN: warm, dry. No rashes.  Neuro: No focal deficits  Musculoskeletal: Muscle strength 5/5 all ext  Psychiatric: Mood and affect normal  Neck: No JVD, no carotid bruits, no thyromegaly, no lymphadenopathy.  Lungs:Clear bilaterally, no wheezes, rhonci, crackles  Cardiovascular: Irreg irreg No murmurs, gallops or rubs.  Abdomen:Soft. Bowel sounds present. Non-tender.  Extremities: No lower extremity edema. Pulses are 2 + in the bilateral DP/PT.   Labs:   Lab Results  Component Value Date   WBC 4.2 07/09/2016   HGB 9.8 (L) 07/09/2016   HCT 29.2 (L) 07/09/2016  MCV 95.1 07/09/2016   PLT 247 07/09/2016    Recent Labs Lab 07/20/16 1846  NA 137  K 4.6  CL 110  CO2 21*  BUN 13  CREATININE 1.29*  CALCIUM 9.0  GLUCOSE 93   Lab Results  Component Value Date   TROPONINI 0.03 ([place]) 06/10/2016     EKG: Narrow complex tachycardia with chronic RBBB. This appears to be atrial tachycardia.   ASSESSMENT AND PLAN: 76 yo male with history of SVT, atrial tach and atrial fib who recently had a CVA. He has not been a candidate for anti-coagulation due to medical non-compliance as well as ulcers, etoh abuse and cirrhosis. He is known to have several arrhythmias. He was seen by our EP team in July 2017 and review of his EKGs and strips at that time with SVT, atrial tach and atrial fib. He was started on amiodarone but this was stopped due to his liver disease. He has been managed on a beta blocker most recently. Now with recurrent tachycardia, but asymptomatic.   1. Atrial tachycardia: I think the EKG this am represents atrial tachycardia. He is on metoprolol. He has chronically low BP. I would try increasing his metoprolol to 50 mg po TID. Follow BP closely. EKG in am. Check TSH, BMET and CBC. If we cannot control his  tachycardia with beta blocker therapy, will ask EP team to see.    2. History of atrial fibrillation: Not a candidate for anti-coagulation.    SignedLauree Chandler 07/21/2016, 3:07 PM

## 2016-07-22 ENCOUNTER — Inpatient Hospital Stay (HOSPITAL_COMMUNITY): Payer: Self-pay | Admitting: Physical Therapy

## 2016-07-22 ENCOUNTER — Inpatient Hospital Stay (HOSPITAL_COMMUNITY): Payer: Medicare Other | Admitting: *Deleted

## 2016-07-22 ENCOUNTER — Inpatient Hospital Stay (HOSPITAL_COMMUNITY): Payer: Medicare Other | Admitting: Speech Pathology

## 2016-07-22 ENCOUNTER — Inpatient Hospital Stay (HOSPITAL_COMMUNITY): Payer: Medicare Other | Admitting: Occupational Therapy

## 2016-07-22 LAB — CBC
HCT: 36.5 % — ABNORMAL LOW (ref 39.0–52.0)
HEMOGLOBIN: 11.9 g/dL — AB (ref 13.0–17.0)
MCH: 30.3 pg (ref 26.0–34.0)
MCHC: 32.6 g/dL (ref 30.0–36.0)
MCV: 92.9 fL (ref 78.0–100.0)
Platelets: 191 10*3/uL (ref 150–400)
RBC: 3.93 MIL/uL — AB (ref 4.22–5.81)
RDW: 15 % (ref 11.5–15.5)
WBC: 12.2 10*3/uL — AB (ref 4.0–10.5)

## 2016-07-22 LAB — BASIC METABOLIC PANEL
ANION GAP: 7 (ref 5–15)
BUN: 17 mg/dL (ref 6–20)
CHLORIDE: 110 mmol/L (ref 101–111)
CO2: 19 mmol/L — ABNORMAL LOW (ref 22–32)
Calcium: 9.2 mg/dL (ref 8.9–10.3)
Creatinine, Ser: 1.12 mg/dL (ref 0.61–1.24)
GFR calc Af Amer: >60 mL/min (ref >60)
GFR calc non Af Amer: >60 mL/min (ref >60)
Glucose, Bld: 105 mg/dL — ABNORMAL HIGH (ref 65–99)
POTASSIUM: 4.6 mmol/L (ref 3.5–5.1)
SODIUM: 136 mmol/L (ref 135–145)

## 2016-07-22 LAB — TSH: TSH: 5.431 u[IU]/mL — AB (ref 0.350–4.500)

## 2016-07-22 MED ORDER — PNEUMOCOCCAL VAC POLYVALENT 25 MCG/0.5ML IJ INJ: 0.5000 mL | INJECTION | INTRAMUSCULAR | Status: DC

## 2016-07-22 MED ORDER — INFLUENZA VAC SPLIT QUAD 0.5 ML IM SUSY
0.5000 mL | PREFILLED_SYRINGE | INTRAMUSCULAR | Status: AC
  Administered 2016-07-23: 0.5 mL via INTRAMUSCULAR
  Filled 2016-07-22 (×2): qty 0.5

## 2016-07-22 NOTE — Progress Notes (Signed)
Occupational Therapy Session Note  Patient Details  Name: Nathan Frank MRN: [id number] Date of Birth: Mar 16, 1941  Today's Date: 07/22/2016 OT Individual Time: 1130-1200 and 1415-1500 OT Individual Time Calculation (min): 30 min and 45 min    Short Term Goals: Week 3:  OT Short Term Goal 1 (Week 3): Pt will complete UB dressing with min assist 4/5 times OT Short Term Goal 2 (Week 3): Pt will complete toilet transfers with RW with min assist OT Short Term Goal 3 (Week 3): Pt will complete bathing with min assist OT Short Term Goal 4 (Week 3): Pt will complete 1 grooming task in standing with supervision  Skilled Therapeutic Interventions/Progress Updates:    1)Treatment session with focus on activity tolerance and initiation during co-treat with TR.  Pt received in bed requiring increased cues to arouse and encourage to participate.  Engaged in ball toss activity in Day Room to increase stimulation and invoke increased participation while listening to pt preferred reggae music for additional arousal and participation.  Utilized various pass positions (over the head, chest pass, and underhand) in sitting to challenge symmetrical BUE usage as well as changing directions to improve reaction time and attention to task with both therapists engaged in ball toss activity.  Completed 2 sets of 5 ball toss in standing with therapist providing min guard for standing balance while tossing ball with TR.  Noted improved participation during activity with familiar music.  Returned to room and left seated upright in w/c awaiting lunch.  2)Treatment session with focus on functional transfers and sit <> stand.  Pt received in bed, but awake and alert.  Discussed wife's plan to attend therapies tomorrow to begin family education and ability to provide assist at current level.  Engaged in education on tub/shower transfers with tub bench.  Pt ambulated into ADL apt bathroom with RW and min guard, completed transfer  into shower with max encouragement.  Pt required mod assist sit > stand from tub bench due to impaired problem solving for hand placement with sit> stand from bench.  Engaged in sit <> stand x5 in therapy gym with focus on hand placement and initiation as well as motor control during sit <> stand, while placing clothespins on vertical rod.  Pt required max multimodal cues to problem solve how to place clothespins on dowel due to apraxia and visual perceptual deficits.  Initiation throughout session 15-30 seconds each sit > stand.  Returned to room and transferred back to bed with min guard.  Therapy Documentation Precautions:  Precautions Precautions: Fall Restrictions Weight Bearing Restrictions: No General:   Vital Signs: Therapy Vitals Temp: 98.4 F (36.9 C) Pulse Rate: 69 Resp: 18 BP: 115/82 Patient Position (if appropriate): Lying Oxygen Therapy SpO2: 98 % O2 Device: Not Delivered Pain:  Pt with no c/o pain  See Function Navigator for Current Functional Status.   Therapy/Group: Individual Therapy  Simonne Come 07/22/2016, 2:36 PM

## 2016-07-22 NOTE — Progress Notes (Signed)
Speech Language Pathology Daily Session Note  Patient Details  Name: Nathan Frank MRN: [id number] Date of Birth: [birth year]  Today's Date: 07/22/2016 SLP Individual Time: [id number] SLP Individual Time Calculation (min): 60 min   Short Term Goals: Week 3: SLP Short Term Goal 1 (Week 3): Patient will initiate functional tasks with no more than 2 repetition cues in 75% of given opportunities. SLP Short Term Goal 2 (Week 3): Patient will demonstrate functional problem solving during basic tasks given Min A verbal cues.  SLP Short Term Goal 3 (Week 3): Patient will demonstrate intellectual awareness of 2 cognitive and 2 physical deficits given Min A verbal cues.  SLP Short Term Goal 4 (Week 3): Pt will utilize external aids to facilitate recall of daily information with min assist.   Skilled Therapeutic Interventions: Pt was seen for skilled ST targeting cognitive goals.  SLP facilitated the session with a grocery planning task to address problem solving.  Pt needed max assist verbal and visual cues for use of organizational strategies to locate listed items from a grocery store flyer.  Performance remains impacted by task initiation.  Pt was left on toilet at the end of today's therapy session in the care of nurse tech.  Continue per current plan of care.    Function:  Eating Eating                 Cognition Comprehension Comprehension assist level: Follows basic conversation/direction with extra time/assistive device  Expression   Expression assist level: Expresses basic needs/ideas: With extra time/assistive device  Social Interaction Social Interaction assist level: Interacts appropriately 75 - 89% of the time - Needs redirection for appropriate language or to initiate interaction.  Problem Solving Problem solving assist level: Solves basic 25 - 49% of the time - needs direction more than half the time to initiate, plan or complete simple activities  Memory Memory assist level:  Recognizes or recalls 75 - 89% of the time/requires cueing 10 - 24% of the time    Pain Pain Assessment Pain Assessment: No/denies pain  Therapy/Group: Individual Therapy  Maverik Foot, Selinda Orion 07/22/2016, 9:17 AM

## 2016-07-22 NOTE — Progress Notes (Signed)
Physical Therapy Session Note  Patient Details  Name: Nathan Frank MRN: [id number] Date of Birth: [birth year]  Today's Date: 07/22/2016 PT Individual Time: 1000-1100 PT Individual Time Calculation (min): 60 min    Short Term Goals: Week 3:  PT Short Term Goal 1 (Week 3): =LTGs due to ELOS   Skilled Therapeutic Interventions/Progress Updates:    Pt resting in bed with no c/o pain when PT arrived, initially states he'd like to "wait a while" before starting therapy but when oriented to schedule agreeable to therapy.  LB dressing initiated in bed with pt able to thread LEs with min assist.  Supine>sit with supervision and bed rails with min cues to scoot to edge of bed for improved balance.  Set up for UB dressing with pt needing shirt oriented and verbal cues for sequencing throughout.  Sit<>Stand with RW and supervision, total assist to pull pants over hips.  Stand/pivot to w/c with supervision.  Gait training 2x40' with RW and supervision with cues for breathing and for squaring up to surface prior to sitting.  HR monitored throughout session and 128-137 throughout session regardless of resting or active status.  Pt returned to room at end of session and positioned seated EOB with bed alarm activated, call bell in reach, and needs met.   Therapy Documentation Precautions:  Precautions Precautions: Fall Restrictions Weight Bearing Restrictions: No   See Function Navigator for Current Functional Status.   Therapy/Group: Individual Therapy  Earnest Conroy Penven-Crew 07/22/2016, 12:50 PM

## 2016-07-22 NOTE — Progress Notes (Signed)
[place]ILIT[facility]     PROGRESS NOTE    Subjective/Complaints: Appreciate cardiology note  ROS: Pt denies fever, rash/itching, headache, blurred or double vision, nausea, vomiting, abdominal pain, diarrhea, chest pain, shortness of breath, Objective: Vital Signs: Blood pressure 99/69, pulse (!) 116, temperature 98.4 F (36.9 C), temperature source Oral, resp. rate 20, height 6' (1.829 m), weight 81.9 kg (180 lb 8.9 oz), SpO2 99 %. Dg Chest 2 View  Result Date: 07/21/2016 CLINICAL DATA:  75 year old male with pulmonary edema. EXAM: CHEST  2 VIEW COMPARISON:  Chest radiograph dated 07/09/2016 FINDINGS: Two views of the chest demonstrate clear lungs. There is no pleural effusion or pneumothorax. Top-normal cardiac silhouette. There is osteopenia with degenerative changes of the spine. No acute fracture. IMPRESSION: No acute cardiopulmonary process. Interval resolution of the previously seen vascular congestion. Electronically Signed   By: Anner Crete M.D.   On: 07/21/2016 05:25   No results for input(s): WBC, HGB, HCT, PLT in the last 72 hours.  Recent Labs  07/20/16 1846  NA 137  K 4.6  CL 110  GLUCOSE 93  BUN 13  CREATININE 1.29*  CALCIUM 9.0   CBG (last 3)  No results for input(s): GLUCAP in the last 72 hours.  Wt Readings from Last 3 Encounters:  07/22/16 81.9 kg (180 lb 8.9 oz)  06/30/16 103.8 kg (228 lb 14.4 oz)  06/16/16 92.6 kg (204 lb 3.2 oz)    Physical Exam:  Constitutional: He is oriented to person, place, and time. He appears well-developed and well-nourished.  HENT:  Head: Normocephalic and atraumatic.  Eyes: Conjunctivae are normal. Pupils are equal, round, and reactive to light.  Neck: Normal range of motion. Neck supple.  Cardiovascular: Normal rate.  An irregular rhythm present. Exam reveals no gallop and no friction rub.   No murmur heard. Respiratory: Effort normal. No stridor. No respiratory distress. He has no wheezes.  He has rhonchi. He has no rales.  GI: Soft. Bowel sounds are increased. There is no tenderness. There is no rebound and no guarding.  Mild distention  ZOX:WRUEA edema BLE Neurological: He is alert and oriented to person, place, and time.  Left facial weakness with minimal dysarthria and occasional wet voice. Flat affect with delayed processing and slow movements.Fair insight and awareness. He is able to follow simple one and two step motor commands.  Left sided weakness: LUW 3-/5 deltoid, bicep, tricep, wrist, hand, 3-/5 HF, KE,tr ADF/PF. Senses pain Left arm and leg.   Skin: Skin is warm and dry. No rash noted.  BLE with dry flaky skin.  Psychiatric: His affect is blunt. His speech is delayed. He is slowed.   Assessment/Plan: 1. 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 Physiatrist is providing close team supervision and 24 hour management of active medical problems listed below. Physiatrist and rehab team continue to assess barriers to discharge/monitor patient progress toward functional and medical goals.  Function:  Bathing Bathing position Bathing activity did not occur: Refused Position: Wheelchair/chair at sink  Bathing parts Body parts bathed by patient: Right arm, Left arm, Chest, Abdomen, Right upper leg, Left upper leg (pt reports RN assisted with perineal area and buttocks ) Body parts bathed by helper: Back  Bathing assist Assist Level:  (Mod Assist)      Upper Body Dressing/Undressing Upper body dressing   What is the patient wearing?:  Pull over shirt/dress     Pull over shirt/dress - Perfomed by patient: Thread/unthread right sleeve, Thread/unthread left sleeve Pull over shirt/dress - Perfomed by helper: Put head through opening, Pull shirt over trunk        Upper body assist Assist Level:  (Mod assist)      Lower Body  Dressing/Undressing Lower body dressing   What is the patient wearing?: Pants, Shoes, [employer] - Performed by helper: Thread/unthread right underwear leg, Thread/unthread left underwear leg, Pull underwear up/down Pants- Performed by patient: Thread/unthread right pants leg, Thread/unthread left pants leg Pants- Performed by helper: Pull pants up/down Non-skid slipper socks- Performed by patient: Don/doff left sock Non-skid slipper socks- Performed by helper: Don/doff right sock   Socks - Performed by helper: Don/doff right sock, Don/doff left sock Shoes - Performed by patient: Don/doff right shoe, Don/doff left shoe Shoes - Performed by helper: Fasten right, Fasten left       TED Hose - Performed by helper: Don/doff right TED hose, Don/doff left TED hose  Lower body assist Assist for lower body dressing:  (Mod assist)      Toileting Toileting Toileting activity did not occur: No continent bowel/bladder event Toileting steps completed by patient: Performs perineal hygiene Toileting steps completed by helper: Adjust clothing prior to toileting, Performs perineal hygiene, Adjust clothing after toileting Toileting Assistive Devices: Grab bar or rail  Toileting assist Assist level:  (Max assist)   Transfers Chair/bed transfer   Chair/bed transfer method: Stand pivot Chair/bed transfer assist level: Supervision or verbal cues Chair/bed transfer assistive device: Armrests, Environmental manager lift: Ecologist     Max distance: 110 Assist level: Supervision or verbal cues   Wheelchair   Type: Manual Max wheelchair distance: 50 Assist Level: Moderate assistance (Pt 50 - 74%)  Cognition Comprehension Comprehension assist level: Follows basic conversation/direction with extra time/assistive device  Expression Expression assist level: Expresses basic needs/ideas: With extra time/assistive device  Social Interaction Social Interaction assist level:  Interacts appropriately 75 - 89% of the time - Needs redirection for appropriate language or to initiate interaction.  Problem Solving Problem solving assist level: Solves basic 25 - 49% of the time - needs direction more than half the time to initiate, plan or complete simple activities  Memory Memory assist level: Recognizes or recalls 75 - 89% of the time/requires cueing 10 - 24% of the time   Medical Problem List and Plan: 1.  Left hemiparesis and funtctional deficits secondary to right MCA embolic infarct  -Team conference today please see physician documentation under team conference tab, met with team face-to-face to discuss problems,progress, and goals. Formulized individual treatment plan based on medical history, underlying problem and comorbidities., .2.  DVT Prophylaxis/Anticoagulation:   Lovenox 3. Pain Management: tylenol prn. Support limbs as needed to maintain joint position and prevent contracture 4. Mood: team to follow for ego support. SW to assess upon admit.  5. Neuropsych: This patient is not yet capable of making decisions on his own behalf. 6. Skin/Wound Care: encourage nutrition and pressure relief as needed 7. Fluids/Electrolytes/Nutrition:  variable 8. Afib: ASA daily             -metoprolol increased to 44m BID for better rate, However, heart rate continues to increase. Appreciate cardiology consult, increase BB to TID, monitor hypotension Vitals:   07/21/16 2000 07/22/16 0511  BP: 105/71 99/69  Pulse: (!) 118 (!) 116  Resp: 18 20  Temp:  98.4 F (  36.9 C)               Mild tachycardia .              -adjust regimen as needed 9. AKI: Creatinine looks to be at a baseline             -continue to push pos  -.  hypokalemia  likely due to diuretics, potassium , resolved with supplementation             - has been on spironolactone and potassium and Lasix at home.  Last potassium normal. Recheck ok   BMP Latest Ref Rng & Units 07/20/2016 07/16/2016 07/13/2016   Glucose 65 - 99 mg/dL 93 130(H) 119(H)  BUN 6 - 20 mg/dL 13 7 6   Creatinine 0.61 - 1.24 mg/dL 1.29(H) 1.23 1.20  Sodium 135 - 145 mmol/L 137 138 139  Potassium 3.5 - 5.1 mmol/L 4.6 4.2 3.9  Chloride 101 - 111 mmol/L 110 104 105  CO2 22 - 32 mmol/L 21(L) 25 22  Calcium 8.9 - 10.3 mg/dL 9.0 8.6(L) 8.5(L)          12. Cirrhosis of Liver.             -monitor LFT's and ammonia as needed                    -no evidence of encephalopathy. At the current time 49. HTN--metoprolol, Aldactone 14.  CHF ef 40-45% monitor for pulm edema,CXR will order follow up  No clear-cut infiltrate as of 07/09/2016, lower extremity edema resolved on diuretics. Reduce Lasix10 mg once a day and spironolactone 12.5 mg per day, goal is for blood pressure to run higher to allow beta blocker to increase,    LOS (Days) 18 A FACE TO FACE EVALUATION WAS PERFORMED  Jered Heiny E 07/22/2016 10:12 AM

## 2016-07-22 NOTE — Progress Notes (Signed)
     SUBJECTIVE: No chest pain or SOB.   BP 99/69 (BP Location: Right Arm)   Pulse (!) 116   Temp 98.4 F (36.9 C) (Oral)   Resp 20   Ht 6' (1.829 m)   Wt 180 lb 8.9 oz (81.9 kg)   SpO2 99%   BMI 24.49 kg/m   Intake/Output Summary (Last 24 hours) at 07/22/16 1239 Last data filed at 07/22/16 0900  Gross per 24 hour  Intake              360 ml  Output              300 ml  Net               60 ml    PHYSICAL EXAM General: Well developed, well nourished, in no acute distress. Alert and oriented x 3.  Psych:  flat affect, responds appropriately Neck: No JVD. No masses noted.  Lungs: Clear bilaterally with no wheezes or rhonci noted.  Heart: Irreg irreg with no murmurs noted. Abdomen: Bowel sounds are present. Soft, non-tender.  Extremities: No lower extremity edema.   LABS: Basic Metabolic Panel:  Recent Labs  07/20/16 1846  NA 137  K 4.6  CL 110  CO2 21*  GLUCOSE 93  BUN 13  CREATININE 1.29*  CALCIUM 9.0   Current Meds: . aspirin  325 mg Oral Daily  . chlorhexidine  15 mL Mouth Rinse BID  . enoxaparin (LOVENOX) injection  40 mg Subcutaneous Q24H  . furosemide  10 mg Oral Daily  . [START ON 07/23/2016] Influenza vac split quadrivalent PF  0.5 mL Intramuscular Tomorrow-1000  . levothyroxine  50 mcg Per Tube QAC breakfast  . metoprolol tartrate  50 mg Oral TID  . multivitamin with minerals  1 tablet Oral Daily  . potassium chloride  20 mEq Oral TID  . spironolactone  12.5 mg Oral Daily  . thiamine  100 mg Per Tube Daily     ASSESSMENT AND PLAN: 75 yo male with history of SVT, atrial tach and atrial fib who recently had a CVA. He has not been a candidate for anti-coagulation due to medical non-compliance as well as ulcers, etoh abuse and cirrhosis. He is known to have several arrhythmias. He was seen by our EP team in July 2017 and review of his EKGs and strips at that time with SVT, atrial tach and atrial fib. He was started on amiodarone but this was stopped  due to his liver disease. He has been managed on a beta blocker most recently. Now with recurrent tachycardia, but asymptomatic.   1. Atrial tachycardia/atrial fibrillation: EKG today c/w atrial fibrillation. EKG 07/21/16 appeared to show atrial tachycardia. Rate is fairly well controlled today on metoprolol 50 mg po TID. He has chronically low BP. TSH, BMET and CBC pending today. He is not felt to be a candidate for anti-coagulation.      Lauree Chandler  10/18/201712:39 PM

## 2016-07-22 NOTE — Progress Notes (Signed)
Recreational Therapy Session Note  Patient Details  Name: Nathan Frank MRN: [id number] Date of Birth: [birth year] Today's Date: 07/22/2016  Pain: no c/o Skilled Therapeutic Interventions/Progress Updates: Session focused on activity tolerance, dynamic sitting & standing balance, initiation, & UE use during cotreat with OT.  Pt enjoys listening to music- reggae & jazz.  Pt sat w/c level for ball toss activity with varying toss types with supervision while listening to reggae music.  Transitioned to standing ball toss with min assist for balance.  Heart rate monitored throughout session and stayed ~130 bpm throughout session, pt without complaints.    Therapy/Group: Co-Treatment   Daven Pinckney 07/22/2016, 3:37 PM

## 2016-07-23 ENCOUNTER — Inpatient Hospital Stay (HOSPITAL_COMMUNITY): Payer: Medicare Other | Admitting: Speech Pathology

## 2016-07-23 ENCOUNTER — Inpatient Hospital Stay (HOSPITAL_COMMUNITY): Payer: Self-pay | Admitting: Occupational Therapy

## 2016-07-23 ENCOUNTER — Inpatient Hospital Stay (HOSPITAL_COMMUNITY): Payer: Medicare Other | Admitting: Occupational Therapy

## 2016-07-23 ENCOUNTER — Encounter (HOSPITAL_COMMUNITY): Payer: Self-pay | Admitting: Occupational Therapy

## 2016-07-23 ENCOUNTER — Ambulatory Visit (HOSPITAL_COMMUNITY): Payer: Self-pay

## 2016-07-23 NOTE — Progress Notes (Signed)
[facility]     PROGRESS NOTE    Subjective/Complaints: Appreciate cardiology note, pt feels ok  ROS: Pt denies fever, rash/itching, headache, blurred or double vision, nausea, vomiting, abdominal pain, diarrhea, chest pain, shortness of breath, Objective: Vital Signs: Blood pressure 93/73, pulse (!) 129, temperature 98.3 F (36.8 C), temperature source Oral, resp. rate 18, height 6' (1.829 m), weight 83.3 kg (183 lb 10.3 oz), SpO2 98 %. No results found.  Recent Labs  07/22/16 1612  WBC 12.2*  HGB 11.9*  HCT 36.5*  PLT 191    Recent Labs  07/20/16 1846 07/22/16 1612  NA 137 136  K 4.6 4.6  CL 110 110  GLUCOSE 93 105*  BUN 13 17  CREATININE 1.29* 1.12  CALCIUM 9.0 9.2   CBG (last 3)  No results for input(s): GLUCAP in the last 72 hours.  Wt Readings from Last 3 Encounters:  07/23/16 83.3 kg (183 lb 10.3 oz)  06/30/16 103.8 kg (228 lb 14.4 oz)  06/16/16 92.6 kg (204 lb 3.2 oz)    Physical Exam:  Constitutional: He is oriented to person, place, and time. He appears well-developed and well-nourished.  HENT:  Head: Normocephalic and atraumatic.  Eyes: Conjunctivae are normal. Pupils are equal, round, and reactive to light.  Neck: Normal range of motion. Neck supple.  Cardiovascular: Normal rate.  An irregular rhythm present. Exam reveals no gallop and no friction rub.   No murmur heard. Respiratory: Effort normal. No stridor. No respiratory distress. He has no wheezes. He has rhonchi. He has no rales.  GI: Soft. Bowel sounds are increased. There is no tenderness. There is no rebound and no guarding.  Mild distention  [id number] edema BLE Neurological: He is alert and oriented to person, place, and time.  Left facial weakness with minimal dysarthria and occasional wet voice. Flat affect with delayed processing and slow movements.Fair insight and awareness. He is able to follow simple one and two step motor commands.  Left sided  weakness: LUW 3-/5 deltoid, bicep, tricep, wrist, hand, 3-/5 HF, KE,tr ADF/PF. Senses pain Left arm and leg.   Skin: Skin is warm and dry. No rash noted.  BLE with dry flaky skin.  Psychiatric: His affect is blunt. His speech is delayed. He is slowed.   Assessment/Plan: 1. 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 Physiatrist is providing close team supervision and 24 hour management of active medical problems listed below. Physiatrist and rehab team continue to assess barriers to discharge/monitor patient progress toward functional and medical goals.  Function:  Bathing Bathing position Bathing activity did not occur: Refused Position: Wheelchair/chair at sink  Bathing parts Body parts bathed by patient: Right arm, Left arm, Chest, Abdomen, Right upper leg, Left upper leg (pt reports RN assisted with perineal area and buttocks ) Body parts bathed by helper: Back  Bathing assist Assist Level:  (Mod Assist)      Upper Body Dressing/Undressing Upper body dressing   What is the patient wearing?: Pull over shirt/dress     Pull over shirt/dress - Perfomed by patient: Thread/unthread right sleeve, Thread/unthread left sleeve Pull over shirt/dress - Perfomed by helper: Put head through opening, Pull shirt over trunk        Upper body assist Assist Level:  (Mod assist)      Lower Body Dressing/Undressing Lower body dressing   What is  the patient wearing?: Non-skid slipper socks, Pants   Underwear - Performed by helper: Thread/unthread right underwear leg, Thread/unthread left underwear leg, Pull underwear up/down Pants- Performed by patient: Thread/unthread left pants leg, Thread/unthread right pants leg Pants- Performed by helper: Pull pants up/down Non-skid slipper socks- Performed by patient: Don/doff left sock Non-skid slipper socks-  Performed by helper: Don/doff right sock   Socks - Performed by helper: Don/doff right sock, Don/doff left sock Shoes - Performed by patient: Don/doff right shoe, Don/doff left shoe Shoes - Performed by helper: Fasten right, Fasten left       TED Hose - Performed by helper: Don/doff right TED hose, Don/doff left TED hose  Lower body assist Assist for lower body dressing:  (Mod assist)      Toileting Toileting Toileting activity did not occur: No continent bowel/bladder event Toileting steps completed by patient: Performs perineal hygiene Toileting steps completed by helper: Adjust clothing prior to toileting, Adjust clothing after toileting Toileting Assistive Devices: Grab bar or rail  Toileting assist Assist level:  (Max assist)   Transfers Chair/bed transfer   Chair/bed transfer method: Stand pivot Chair/bed transfer assist level: Supervision or verbal cues Chair/bed transfer assistive device: Armrests, Environmental manager lift: Ecologist     Max distance: 40+40 Assist level: Supervision or verbal cues   Wheelchair   Type: Manual Max wheelchair distance: 50 Assist Level: Moderate assistance (Pt 50 - 74%)  Cognition Comprehension Comprehension assist level: Follows basic conversation/direction with extra time/assistive device  Expression Expression assist level: Expresses basic needs/ideas: With extra time/assistive device  Social Interaction Social Interaction assist level: Interacts appropriately 75 - 89% of the time - Needs redirection for appropriate language or to initiate interaction.  Problem Solving Problem solving assist level: Solves basic 25 - 49% of the time - needs direction more than half the time to initiate, plan or complete simple activities  Memory Memory assist level: Recognizes or recalls 75 - 89% of the time/requires cueing 10 - 24% of the time   Medical Problem List and Plan: 1.  Left hemiparesis and funtctional deficits secondary  to right MCA embolic infarct  -cont CIR PT,OT, SLP .2.  DVT Prophylaxis/Anticoagulation:   Lovenox 3. Pain Management: tylenol prn. Support limbs as needed to maintain joint position and prevent contracture 4. Mood: team to follow for ego support. SW to assess upon admit.  5. Neuropsych: This patient is not yet capable of making decisions on his own behalf. 6. Skin/Wound Care: encourage nutrition and pressure relief as needed 7. Fluids/Electrolytes/Nutrition:  variable 8. Afib: ASA daily             -metoprolol increased to 50mg  TID. Appreciate cardiology consult,monitor hypotension Vitals:   07/22/16 2214 07/23/16 0439  BP: 102/78 93/73  Pulse:  (!) 129  Resp:  18  Temp:  98.3 F (36.8 C)               Mild tachycardia .              -adjust regimen as needed 9. AKI: Creatinine looks to be at a baseline             -continue to push pos  -.  hypokalemia  likely due to diuretics, potassium , resolved with supplementation             - has been on spironolactone and potassium and Lasix at home.  Last potassium normal. Recheck ok   BMP Latest Ref Rng &  Units 07/22/2016 07/20/2016 07/16/2016  Glucose 65 - 99 mg/dL 105(H) 93 130(H)  BUN 6 - 20 mg/dL 17 13 7   Creatinine 0.61 - 1.24 mg/dL 1.12 1.29(H) 1.23  Sodium 135 - 145 mmol/L 136 137 138  Potassium 3.5 - 5.1 mmol/L 4.6 4.6 4.2  Chloride 101 - 111 mmol/L 110 110 104  CO2 22 - 32 mmol/L 19(L) 21(L) 25  Calcium 8.9 - 10.3 mg/dL 9.2 9.0 8.6(L)          12. Cirrhosis of Liver.             -monitor LFT's and ammonia as needed                    -no evidence of encephalopathy. At the current time 59. HTN--metoprolol, Aldactone 14.  CHF ef 40-45%    lower extremity edema resolved on diuretics. Reduce Lasix10 mg once a day and spironolactone 12.5 mg per day, goal is for blood pressure to run higher to allow beta blocker to increase,    LOS (Days) 19 A FACE TO FACE EVALUATION WAS PERFORMED  KIRSTEINS,ANDREW E 07/23/2016 8:12 AM

## 2016-07-23 NOTE — Patient Care Conference (Signed)
Inpatient RehabilitationTeam Conference and Plan of Care Update Date: 07/22/2016   Time: 11:00 AM    Patient Name: Nathan Frank      Medical Record Number: [id number]  Date of Birth: 11/02/1940 Sex: Male         Room/Bed: 4W03C/4W03C-01 Payor Info: Payor: [employer] MEDICARE / Plan: [employer] MEDICARE / Product Type: *No Product type* /    Admitting Diagnosis: R CVA  Admit Date/Time:  07/04/2016  5:58 PM Admission Comments: No comment available   Primary Diagnosis:  Cerebral infarction due to embolism of right middle cerebral artery (HCC) Principal Problem: Cerebral infarction due to embolism of right middle cerebral artery [facility])  Patient Active Problem List   Diagnosis Date Noted  . Atrial tachycardia ([place])   . Left hemiparesis ([place]) 07/04/2016  . Acute ischemic right MCA stroke ([place]) 07/04/2016  . Obesity 07/01/2016  . Family hx-stroke 07/01/2016  . Hyperglycemia 07/01/2016  . Wound of left leg 07/01/2016  . Dysphagia, post-stroke   . Dysarthria, post-stroke   . Pyrexia   . Bleeding gastrointestinal   . Polysubstance abuse   . Acute on chronic combined systolic and diastolic heart failure ([place])   . Mitral valve regurgitation   . Tachypnea   . Anemia of chronic disease   . Leukocytosis   . Cerebral infarction due to embolism of right middle cerebral artery ([place]) - s/p mechanical thrombectomy 06/25/2016  . Hypomagnesemia 06/09/2016  . Acute on chronic diastolic CHF (congestive heart failure) ([place]) 06/09/2016  . Alcoholic cirrhosis of liver with ascites ([place])   . Other specified hypothyroidism   . Hepatocellular carcinoma ([place])   . Alcohol withdrawal ([place]) 05/27/2016  . Generalized weakness 05/27/2016  . Hypokalemia 05/27/2016  . Dehydration 05/27/2016  . Diarrhea 04/28/2016  . SIRS (systemic inflammatory response syndrome) ([place]) 04/19/2016  . Alcohol use disorder, moderate, in controlled environment, dependence ([place])   . Lactic acidosis   . Tachycardia   . Atrial  fibrillation with RVR ([place]) 04/18/2016  . Chest pain 04/18/2016  . Hypotension 04/18/2016  . Elevated lactic acid level 04/18/2016  . Chronic atrial fibrillation ([place]) 04/18/2016  . SVT (supraventricular tachycardia) ([place])   . Prolonged Q-T interval on ECG   . Neuropathy ([place])   . Essential hypertension   . Thrombocytopenia ([place]) 04/16/2015  . Abnormal TSH 04/16/2015  . NSTEMI (non-ST elevated myocardial infarction) ([place])   . Acute gastric ulcer   . Acute gastritis with hemorrhage   . GI bleed due to NSAIDs 12/13/2014  . Right knee pain 12/13/2014  . Dizziness and giddiness 12/13/2014  . Upper GI bleeding 12/13/2014  . Heroin overdose 02/20/2014  . S/P alcohol detoxification 02/02/2014  . Alcohol dependence ([place]) 02/02/2014  . Depressive disorder 02/01/2014  . Symptomatic cholelithiasis 12/15/2013  . Abdominal pain 12/14/2013  . Gastritis 06/07/2012  . DVT of lower limb, acute ([place]) 06/06/2012  . Oral thrush 06/05/2012  . Alcohol abuse 06/04/2012  . Weight loss 06/04/2012  . 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 Liver cirrhosis ([place]) 06/04/2012  . Hematuria 06/04/2012    Expected Discharge Date: Expected Discharge Date: 07/30/16  Team Members Present: Physician leading conference: Dr. Alysia Penna Social Worker Present: Alfonse Alpers, LCSW Nurse Present: Heather Roberts, RN PT Present: Dwyane Dee, PT OT Present: Simonne Come, OT SLP Present: Windell Moulding, SLP PPS Coordinator present : Daiva Nakayama, RN, CRRN     Current Status/Progress Goal Weekly Team Focus  Medical   Poor sleep last  night,   maintain med stability  Cardiology eval   Bowel/Bladder   Continent of bowel and bladder, condom cath at night. last bm 10/17  remain continent  monitor b/b function   Swallow/Nutrition/ Hydration   Regular, thin liquids; intermittent supervision   mod I   goals met   ADL's   improved initiation with functional tasks and sit > stand,  mod assist bathing and UB dressing, max assist LB dressing.  On 10/17 pt with decreased arousal and difficulty fully participating in any functional tasks  supervision overall   initiation, orientation, sequencing, Lt attention, LUE NMR, visuospatial, transfers, pt/family education   Mobility   supervision/min assist overall, though still needs lots of cues for attention and recall  supervision level overall, household ambulatory with RW or w/c, d/c'd community w/c goal as not appropriate  balance, transfers, gait, stair training, family training 10/19   Communication             Safety/Cognition/ Behavioral Observations  was min-mod assist for basic tasks but now with functional decline over the last 24 hours, back to needing mod-max assist   supervision   continue to address task initiation, awareness of deficits, and functional problem solving    Pain   Denies pain  pain less than 2  Monitor for pain and medicate prn   Skin   Healing L skin tear.  No skin breakdown.  Assess skin q shift.    Rehab Goals Patient on target to meet rehab goals: Yes Rehab Goals Revised: none *See Care Plan and progress notes for long and short-term goals.  Barriers to Discharge: Fluctuating participation    Possible Resolutions to Barriers:  See above    Discharge Planning/Teaching Needs:  Pt to return to his home with his wife to provide supervision.  Wife still plans to come for family education 07-23-16.  She feels comfortable with pt's care.   Team Discussion:  Pt with tachycardia this week and cardiology was consulted.  There is not much they can adjust, but did increase his beta blocker.  Pt did not endorse any symptoms and has no restrictions, unless he needs a rest.  Pt was a goal level with therapists on Monday, then decline on Tuesday, with better sessions today.  Still needs lots of cues and has poor visuospatial skills.  Pt is fluctuating with levels, but better today and hope for supervision  goals to still be met with pt having extra time to complete tasks.  RN asked if pt could have the flu and pneumonia shots and Dr. Letta Pate said he could, if desired by pt/family.  Revisions to Treatment Plan:  none   Continued Need for Acute Rehabilitation Level of Care: The patient requires daily medical management by a physician with specialized training in physical medicine and rehabilitation for the following conditions: Daily direction of a multidisciplinary physical rehabilitation program to ensure safe treatment while eliciting the highest outcome that is of practical value to the patient.: Yes Daily medical management of patient stability for increased activity during participation in an intensive rehabilitation regime.: Yes Daily analysis of laboratory values and/or radiology reports with any subsequent need for medication adjustment of medical intervention for : Cardiac problems;Neurological problems  Shalayne Leach, Silvestre Mesi 07/23/2016, 11:43 PM

## 2016-07-23 NOTE — Progress Notes (Signed)
Occupational Therapy Session Note  Patient Details  Name: Nathan Frank MRN: [id number] Date of Birth: Jun 10, 1941  Today's Date: 07/23/2016 OT Individual Time: [id number] OT Individual Time Calculation (min): 28 min     Skilled Therapeutic Interventions/Progress Updates:    Pt refusing to work with therapist to begin session.  Eventually had him agree to work sitting on EOB.  Had pt focus on FM coordination tasks in sitting once he transferred to the edge of the bed with supervision.  Pt with increased difficulty manipulating small pegs of nine hole peg test during session.  He completed all pegs in 1 min and 13 secs with the right hand and was only able to complete 3-4 pegs with mod assist from therapist in 3 mins using the LUE.  Decreased motor planning noted with LUE use as well when pt was attempting to perform opposition to thumb of all digits, following demonstrational cueing from therapist.  Had pt also work on picking up checkers, which he could do, one at a time, but not translate from finger tip to palm.  Pt placed back in bed at end of session with call button and phone in reach.  Bed alarm also in place.   Therapy Documentation Precautions:  Precautions Precautions: Fall Restrictions Weight Bearing Restrictions: No  Pain: Pain Assessment Pain Assessment: No/denies pain ADL: See Function Navigator for Current Functional Status.   Therapy/Group: Individual Therapy  Angelyn Osterberg OTR/L 07/23/2016, 4:22 PM

## 2016-07-23 NOTE — Progress Notes (Signed)
Speech Language Pathology Daily Session Note  Patient Details  Name: Nathan Frank MRN: [id number] Date of Birth: Oct 16, 1940  Today's Date: 07/23/2016 SLP Individual Time: 1120-1200 SLP Individual Time Calculation (min): 40 min   Short Term Goals: Week 3: SLP Short Term Goal 1 (Week 3): Patient will initiate functional tasks with no more than 2 repetition cues in 75% of given opportunities. SLP Short Term Goal 2 (Week 3): Patient will demonstrate functional problem solving during basic tasks given Min A verbal cues.  SLP Short Term Goal 3 (Week 3): Patient will demonstrate intellectual awareness of 2 cognitive and 2 physical deficits given Min A verbal cues.  SLP Short Term Goal 4 (Week 3): Pt will utilize external aids to facilitate recall of daily information with min assist.   Skilled Therapeutic Interventions: Pt was seen for skilled ST targeting family education.  [name] was present during today's therapy session and remained actively engaged in all training tasks.  Therapist discussed current goals and progress in therapies and pt's ongoing need for increased time for initiation during functional tasks.  SLP also provided skilled education regarding memory compensatory strategies, emphasizing use of routine, written visual aids, and environmental organization to maximize recall of daily information.  Handout was provided to facilitate carryover in the home environment.  All questions were answered to pt's wife's satisfaction.  [name] aware of recommendations that pt have assistance for medication and financial management at discharge in addition to 24/7 supervision and ST follow up at next level of care.  Pt left in bed at the end of today's therapy session with call bell within reach.  Continue per current plan of care.    Function:  Eating Eating                 Cognition Comprehension Comprehension assist level: Follows basic conversation/direction with extra  time/assistive device  Expression   Expression assist level: Expresses basic needs/ideas: With extra time/assistive device  Social Interaction Social Interaction assist level: Interacts appropriately 75 - 89% of the time - Needs redirection for appropriate language or to initiate interaction.  Problem Solving Problem solving assist level: Solves basic 50 - 74% of the time/requires cueing 25 - 49% of the time  Memory Memory assist level: Recognizes or recalls 75 - 89% of the time/requires cueing 10 - 24% of the time    Pain Pain Assessment Pain Assessment: No/denies pain  Therapy/Group: Individual Therapy  Ardena Gangl, Selinda Orion 07/23/2016, 4:46 PM

## 2016-07-23 NOTE — Progress Notes (Signed)
Social Work Patient ID: Nathan Frank, male   DOB: 02/20/1941, 74 y.o.   MRN: [id number]   CSW met with pt to update him on team conference discussion and that d/c date remains 07-30-16.  Pt is excited about his son and dtr-in-law moving to [place] from [place] and that son will come visit and help on weekends.  Talked with pt's wife via telephone to give her update and confirm family education 07-23-16.  She will be present and is pleased with pt's progress. Talked to her after family education today and she feels she can handle pt at home and is glad he still has another week to get even stronger.  CSW will continue to follow and assist as needed with d/c planning  

## 2016-07-23 NOTE — Progress Notes (Signed)
Physical Therapy Session Note  Patient Details  Name: Nathan Frank MRN: [id number] Date of Birth: 12-23-1940  Today's Date: 07/23/2016 PT Individual Time: 1300-1400 PT Individual Time Calculation (min): 60 min    Short Term Goals: Week 2:  PT Short Term Goal 1 (Week 2): Pt will propel w/c 100' with supervision  PT Short Term Goal 1 - Progress (Week 2): Not met PT Short Term Goal 2 (Week 2): Pt will transfer with min assist and LRAD PT Short Term Goal 2 - Progress (Week 2): Met PT Short Term Goal 3 (Week 2): Pt will ambulate 100' with LRAD and min assist PT Short Term Goal 3 - Progress (Week 2): Met PT Short Term Goal 4 (Week 2): Pt will initiate sit<>stand with min verbal cues in 50% of observed trials.  PT Short Term Goal 4 - Progress (Week 2): Met  Skilled Therapeutic Interventions/Progress Updates:    Session focused on family education with pt and pt's wife in regards to preparation for discharge including simulated car transfer, bed to chair transfers, bed mobility in ADL apartment, stair negotiation training for home entry, and education on deficits and cues for initiation and safety. Pt overall steady assist to close supervision with mobility using RW. Pt noted with uncontrolled descent during stand -> sit and would benefit from blocked practice training to improve eccentric control. [name] return demonstrated transfers, gait, and stairs with cues for her positioning (to L side) and to allow him time to process commands. Pt able to gait x 2 trials of about 53' each but becomes easily fatigued. Recommend w/c for community mobility. Wife reports they have a RW and cane at home already. She plans to come to therapy sessions tomorrow as well and will bring in pt's RW from home to have fitted appropriately (she thinks it is lower).    Therapy Documentation Precautions:  Precautions Precautions: Fall Restrictions Weight Bearing Restrictions: No  Pain:  No complaints.    See  Function Navigator for Current Functional Status.   Therapy/Group: Individual Therapy  Canary Brim Ivory Broad, PT, DPT  07/23/2016, 2:36 PM

## 2016-07-23 NOTE — Progress Notes (Signed)
Occupational Therapy Session Note  Patient Details  Name: Nathan Frank MRN: [id number] Date of Birth: June 23, 1941  Today's Date: 07/23/2016 OT Individual Time: 1000-1100 OT Individual Time Calculation (min): 60 min     Short Term Goals: Week 3:  OT Short Term Goal 1 (Week 3): Pt will complete UB dressing with min assist 4/5 times OT Short Term Goal 2 (Week 3): Pt will complete toilet transfers with RW with min assist OT Short Term Goal 3 (Week 3): Pt will complete bathing with min assist OT Short Term Goal 4 (Week 3): Pt will complete 1 grooming task in standing with supervision  Skilled Therapeutic Interventions/Progress Updates:    Engaged in family education with focus on initiation, orientation, visual perception during self-care tasks of dressing and functional transfers.  [name] present to observe therapy session with focus on discussion of deficits and d/c planning as concerns whether she would be able to provide the necessary physical and cognitive assist.  Pt more alert and engaged in this session, however declining bathing at this time.  Completed dressing with LB dressing while seated at EOB with pt requiring increased wait time with simple verbal cues for initiation.  Pt required 10-12 mins to complete donning pants with difficulty problem solving when unable to reach towards Lt foot as well as attempts to thread pants through foot hole.  Educated pt's wife on allowing increased time for initiation to increase participation in self-care tasks.  Completed tub/shower and toilet transfers in ADL apt bathroom with pt requiring cues for RW placement during transfers to keep close during turns and approach to descent.  Pt required mod assist sit > stand from lower surface of toilet.  [name] reports they have elevated toilet seat, however may benefit from 3 in 1 over commode to provide arm rests.    When asked if questions, pt's wife with no questions but stating that pt has made  progress and is hopeful he will progress more prior to d/c next week.  [name] would benefit from additional family education session prior to d/c home.  Therapy Documentation Precautions:  Precautions Precautions: Fall Restrictions Weight Bearing Restrictions: No Pain:  Pt with no c/o pain  See Function Navigator for Current Functional Status.   Therapy/Group: Individual Therapy  Simonne Come 07/23/2016, 12:33 PM

## 2016-07-24 ENCOUNTER — Inpatient Hospital Stay (HOSPITAL_COMMUNITY): Payer: Medicare Other | Admitting: Speech Pathology

## 2016-07-24 ENCOUNTER — Inpatient Hospital Stay (HOSPITAL_COMMUNITY): Payer: Medicare Other | Admitting: Occupational Therapy

## 2016-07-24 ENCOUNTER — Inpatient Hospital Stay (HOSPITAL_COMMUNITY): Payer: Medicare Other | Admitting: *Deleted

## 2016-07-24 NOTE — Progress Notes (Signed)
Occupational Therapy Session Note  Patient Details  Name: Nathan Frank MRN: [id number] Date of Birth: 1940-10-23  Today's Date: 07/24/2016 OT Individual Time: 0930-1040 OT Individual Time Calculation (min): 70 min     Short Term Goals: Week 3:  OT Short Term Goal 1 (Week 3): Pt will complete UB dressing with min assist 4/5 times OT Short Term Goal 2 (Week 3): Pt will complete toilet transfers with RW with min assist OT Short Term Goal 3 (Week 3): Pt will complete bathing with min assist OT Short Term Goal 4 (Week 3): Pt will complete 1 grooming task in standing with supervision  Skilled Therapeutic Interventions/Progress Updates:    Upon entering the room, pt seated in wheelchair with no c/o pain. Pt complaining of fatigue and requesting to return to bed to sleep. OT providing maximal encouragement for pt to participate this session. Pt propelling wheelchair with min A 50' and increased time in hallway. OT propelled pt the rest of the way to laundry area for time management. Pt performed sit <>stand with min A and ambulated with RW 10' into laundry room. Once opening washing machine and grabbing one article of clothing, pt urgently requesting to sit secondary from fatigue. Pt completing task from wheelchair level with OT placing clothing into B hands and pt placing into dryer. Pt also engaged in old maid card game with pt having to use L UE to flip, locate, and pass cards. Pt needing mod cues for sequencing and initiation during game. Pt returned to room at end of session and transferred back into bed with min A stand pivot transfer with RW. Call bell and all needed items within reach. Bed alarm activated.   Therapy Documentation Precautions:  Precautions Precautions: Fall Restrictions Weight Bearing Restrictions: No General:   Vital Signs: Therapy Vitals Pulse Rate: 62 BP: 102/64 Pain: Pain Assessment Pain Assessment: No/denies pain ADL: ADL ADL Comments: Please see  functional navigator for ADL levels  See Function Navigator for Current Functional Status.   Therapy/Group: Individual Therapy  Phineas Semen 07/24/2016, 12:53 PM

## 2016-07-24 NOTE — Progress Notes (Signed)
Physical Therapy Session Note  Patient Details  Name: Nathan Frank MRN: [id number] Date of Birth: July 03, 1941  Today's Date: 07/24/2016 PT Individual Time: 0800-0900 PT Individual Time Calculation (min): 60 min    Short Term Goals:Week 2:  PT Short Term Goal 1 (Week 2): Pt will propel w/c 100' with supervision  PT Short Term Goal 1 - Progress (Week 2): Not met PT Short Term Goal 2 (Week 2): Pt will transfer with min assist and LRAD PT Short Term Goal 2 - Progress (Week 2): Met PT Short Term Goal 3 (Week 2): Pt will ambulate 100' with LRAD and min assist PT Short Term Goal 3 - Progress (Week 2): Met PT Short Term Goal 4 (Week 2): Pt will initiate sit<>stand with min verbal cues in 50% of observed trials.  PT Short Term Goal 4 - Progress (Week 2): Met Week 3:  PT Short Term Goal 1 (Week 3): =LTGs due to ELOS   Skilled Therapeutic Interventions/Progress Updates:  Tx focused on functional mobility training, gait with RW, and NMR via forced use, manual facilitation, and multi-modal cues during functainal tasks. Cognitive remediation focused on initiation and completion of functional tasks in a timely manner.  Pt up in a bathroom with RN upon arrival.   Pt a ble to doff  Briefs in standing, stepping out of them with 1 UE support, needing min-guard A for steadying during SLS. Lower-body dressing performed with assist due to time constraints.   Sit<>stands x6 throughout tx with close S/min-guard A and UE support, safety cues. Pt a ble to stand x33mn with UE support and close S with postural cues during functional tasks.   [facility] with close S and cues for full trunk lean to reach. Pt demonstrating good use of  bil UEs, although slow. Pt a ble bto perform reciprocal scooting in WC with S cues for technique.   Gait with RW in controlled setting x50' with close S and postural cues, limited by fatigue.  Pt propelled WC x235' with Mod A and  multi-modal cues for UE/LE use. Pt only a ble to functionally use RUE for this task with cues for stroke length.   Pt needed min A to complete task at laundry machine, wanting to return to room for  [employer]  Pt a ble to direc tself cares for A with  Breakfast set-up.  Overall, pt's processing time ranged from 3-10 seconds following simple commands for functional tasks, and needed increased time to complete tasks.  [facility].      Therapy Documentation Precautions:  Precautions Precautions: Fall Restrictions Weight Bearing Restrictions: No General:   Vital Signs: Therapy Vitals Temp: 98.7 F (37.1 C) Temp Source: Oral Pulse Rate: 98 Resp: 17 BP: 124/68 Patient Position (if appropriate): Lying Pain: none     See Function Navigator for Current Functional Status.   Therapy/Group: Individual Therapy  Karthikeya Funke, CCorinna Lines PT, DPT  07/24/2016, 7:54 AM

## 2016-07-24 NOTE — Progress Notes (Signed)
Speech Language Pathology Daily Session Note  Patient Details  Name: Nathan Frank MRN: [id number] Date of Birth: 17-Aug-1941  Today's Date: 07/24/2016 SLP Individual Time: 1100-1200 SLP Individual Time Calculation (min): 60 min   Short Term Goals: Week 3: SLP Short Term Goal 1 (Week 3): Patient will initiate functional tasks with no more than 2 repetition cues in 75% of given opportunities. SLP Short Term Goal 2 (Week 3): Patient will demonstrate functional problem solving during basic tasks given Min A verbal cues.  SLP Short Term Goal 3 (Week 3): Patient will demonstrate intellectual awareness of 2 cognitive and 2 physical deficits given Min A verbal cues.  SLP Short Term Goal 4 (Week 3): Pt will utilize external aids to facilitate recall of daily information with min assist.   Skilled Therapeutic Interventions:Pt seen for skilled SLP treatment with focus on cognitive goals. Pt able to mantain sustained attention to task with occasional verbal cueing. Pt initially able to recall 25% of home meds, however with use of rehearsal, pt able to recall 90% of meds at mod I by the conclusion of the session. 100% recall achieved with min semantic cueing.    Function:  Eating Eating                 Cognition Comprehension Comprehension assist level: Follows basic conversation/direction with extra time/assistive device  Expression   Expression assist level: Expresses basic needs/ideas: With extra time/assistive device  Social Interaction Social Interaction assist level: Interacts appropriately 75 - 89% of the time - Needs redirection for appropriate language or to initiate interaction.  Problem Solving Problem solving assist level: Solves basic 50 - 74% of the time/requires cueing 25 - 49% of the time  Memory Memory assist level: Recognizes or recalls 75 - 89% of the time/requires cueing 10 - 24% of the time    Pain Pain Assessment Pain Assessment: No/denies pain  Therapy/Group:  Individual Therapy  Vinetta Bergamo MA, CCC-SLP 07/24/2016, 12:54 PM

## 2016-07-24 NOTE — Progress Notes (Signed)
[place] PHYSICAL MEDICINE & REHABILITATION     PROGRESS NOTE    Subjective/Complaints: Pt working with PT, no c/os stands to pull up pants with min A ROS: Pt denies fever, rash/itching, headache, blurred or double vision, nausea, vomiting, abdominal pain, diarrhea, chest pain, shortness of breath, Objective: Vital Signs: Blood pressure 124/68, pulse 98, temperature 98.7 F (37.1 C), temperature source Oral, resp. rate 17, height 6' (1.829 m), weight 84 kg (185 lb 3 oz), SpO2 98 %. No results found.  Recent Labs  07/22/16 1612  WBC 12.2*  HGB 11.9*  HCT 36.5*  PLT 191    Recent Labs  07/22/16 1612  NA 136  K 4.6  CL 110  GLUCOSE 105*  BUN 17  CREATININE 1.12  CALCIUM 9.2   CBG (last 3)  No results for input(s): GLUCAP in the last 72 hours.  Wt Readings from Last 3 Encounters:  07/24/16 84 kg (185 lb 3 oz)  06/30/16 103.8 kg (228 lb 14.4 oz)  06/16/16 92.6 kg (204 lb 3.2 oz)    Physical Exam:  Constitutional: He is oriented to person, place, and time. He appears well-developed and well-nourished.  HENT:  Head: Normocephalic and atraumatic.  Eyes: Conjunctivae are normal. Pupils are equal, round, and reactive to light.  Neck: Normal range of motion. Neck supple.  Cardiovascular: Normal rate.  An irregular rhythm present. Exam reveals no gallop and no friction rub.   No murmur heard. Respiratory: Effort normal. No stridor. No respiratory distress. He has no wheezes. He has rhonchi. He has no rales.  GI: Soft. Bowel sounds are increased. There is no tenderness. There is no rebound and no guarding.  Mild distention  [id number] edema BLE Neurological: He is alert and oriented to person, place, and time.  Left facial weakness with minimal dysarthria and occasional wet voice. Flat affect with delayed processing and slow movements.Fair insight and awareness. He is able to follow simple one and two step motor commands.  Left sided weakness: LUW 3-/5 deltoid, bicep,  tricep, wrist, hand, 3-/5 HF, KE,tr ADF/PF. Senses pain Left arm and leg.   Skin: Skin is warm and dry. No rash noted.  BLE with dry flaky skin.  Psychiatric: His affect is blunt. His speech is delayed. He is slowed.   Assessment/Plan: 1. 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 Physiatrist is providing close team supervision and 24 hour management of active medical problems listed below. Physiatrist and rehab team continue to assess barriers to discharge/monitor patient progress toward functional and medical goals.  Function:  Bathing Bathing position Bathing activity did not occur: Refused Position: Wheelchair/chair at sink  Bathing parts Body parts bathed by patient: Right arm, Left arm, Chest, Abdomen, Right upper leg, Left upper leg (pt reports RN assisted with perineal area and buttocks ) Body parts bathed by helper: Back  Bathing assist Assist Level:  (Mod Assist)      Upper Body Dressing/Undressing Upper body dressing   What is the patient wearing?: Pull over shirt/dress     Pull over shirt/dress - Perfomed by patient: Thread/unthread right sleeve, Thread/unthread left sleeve, Put head through opening Pull over shirt/dress - Perfomed by helper: Pull shirt over trunk        Upper body assist Assist Level:  (Min assist)      Lower Body Dressing/Undressing Lower body dressing   What is the patient wearing?:  Pants, Shoes, [employer] - Performed by helper: Thread/unthread right underwear leg, Thread/unthread left underwear leg, Pull underwear up/down Pants- Performed by patient: Thread/unthread right pants leg Pants- Performed by helper: Thread/unthread left pants leg, Pull pants up/down Non-skid slipper socks- Performed by patient: Don/doff left sock Non-skid slipper socks- Performed by helper: Don/doff right sock   Socks  - Performed by helper: Don/doff right sock, Don/doff left sock Shoes - Performed by patient: Don/doff left shoe Shoes - Performed by helper: Fasten right, Fasten left, Don/doff right shoe       TED Hose - Performed by helper: Don/doff right TED hose, Don/doff left TED hose  Lower body assist Assist for lower body dressing:  (Max assist)      Toileting Toileting Toileting activity did not occur: No continent bowel/bladder event Toileting steps completed by patient: Performs perineal hygiene Toileting steps completed by helper: Adjust clothing prior to toileting, Adjust clothing after toileting Toileting Assistive Devices: Grab bar or rail  Toileting assist Assist level:  (Max assist)   Transfers Chair/bed transfer   Chair/bed transfer method: Squat pivot, Ambulatory Chair/bed transfer assist level: Touching or steadying assistance (Pt > 75%) Chair/bed transfer assistive device: Armrests, Walker Mechanical lift: Ecologist     Max distance: 50' Assist level: Supervision or verbal cues   Wheelchair   Type: Manual Max wheelchair distance: 25' Assist Level: Moderate assistance (Pt 50 - 74%)  Cognition Comprehension Comprehension assist level: Follows basic conversation/direction with extra time/assistive device  Expression Expression assist level: Expresses basic needs/ideas: With extra time/assistive device  Social Interaction Social Interaction assist level: Interacts appropriately 75 - 89% of the time - Needs redirection for appropriate language or to initiate interaction.  Problem Solving Problem solving assist level: Solves basic 50 - 74% of the time/requires cueing 25 - 49% of the time  Memory Memory assist level: Recognizes or recalls 75 - 89% of the time/requires cueing 10 - 24% of the time   Medical Problem List and Plan: 1.  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  DVT  Prophylaxis/Anticoagulation:   Lovenox 3. Pain Management: tylenol prn. Support limbs as needed to maintain joint position and prevent contracture 4. Mood: team to follow for ego support. SW to assess upon admit.  5. Neuropsych: This patient is not yet capable of making decisions on his own behalf. 6. Skin/Wound Care: encourage nutrition and pressure relief as needed 7. Fluids/Electrolytes/Nutrition:  variable 8. Afib: ASA daily             -metoprolol increased to 50mg  TID. Appreciate cardiology consult,monitor hypotension Vitals:   07/23/16 2057 07/24/16 0633  BP: 118/66 124/68  Pulse: (!) 106 98  Resp:  17  Temp:  98.7 F (37.1 C)               Mild tachycardia .              -adjust regimen as needed 9. AKI: Creatinine looks to be at a baseline             -continue to push pos  -.  hypokalemia  likely due to diuretics, potassium , resolved with supplementation             - has been on spironolactone and potassium and Lasix at home.  Last potassium normal. Recheck ok   BMP Latest Ref Rng & Units 07/22/2016 07/20/2016 07/16/2016  Glucose 65 -  99 mg/dL 105(H) 93 130(H)  BUN 6 - 20 mg/dL 17 13 7   Creatinine 0.61 - 1.24 mg/dL 1.12 1.29(H) 1.23  Sodium 135 - 145 mmol/L 136 137 138  Potassium 3.5 - 5.1 mmol/L 4.6 4.6 4.2  Chloride 101 - 111 mmol/L 110 110 104  CO2 22 - 32 mmol/L 19(L) 21(L) 25  Calcium 8.9 - 10.3 mg/dL 9.2 9.0 8.6(L)          12. Cirrhosis of Liver.             -monitor LFT's and ammonia as needed                    -no evidence of encephalopathy. At the current time 26. HTN--metoprolol, Aldactone 14.  CHF ef 40-45%    lower extremity edema resolved on diuretics. Reduce Lasix10 mg once a day and spironolactone 12.5 mg per day, goal is for blood pressure to run higher to allow beta blocker to increase, Appreciate cardiology eval   LOS (Days) 20 A FACE TO FACE EVALUATION WAS PERFORMED  Alysia Penna E 07/24/2016 8:34 AM

## 2016-07-25 ENCOUNTER — Encounter (HOSPITAL_COMMUNITY): Payer: Self-pay

## 2016-07-25 DIAGNOSIS — D62 Acute posthemorrhagic anemia: Secondary | ICD-10-CM

## 2016-07-25 DIAGNOSIS — D72820 Lymphocytosis (symptomatic): Secondary | ICD-10-CM

## 2016-07-25 NOTE — Progress Notes (Signed)
Occupational Therapy Session Note  Patient Details  Name: Nathan Frank MRN: [id number] Date of Birth: 14-Feb-1941  Today's Date: 07/25/2016 OT Individual Time: 3149-7026 OT Individual Time Calculation (min): 42 min , Today's Date: 07/25/2016    Short Term Goals: Week 1:  OT Short Term Goal 1 (Week 1): Pt will complete toilet transfer with Max A and 1 helper with LRAD OT Short Term Goal 2 (Week 1): Pt will complete sit<stand with LB dressing and  Mod A OT Short Term Goal 3 (Week 1): Pt will self sequence BADL steps with mod vcs OT Short Term Goal 4 (Week 1): Pt will engage in 10 minute thearpeutic activity with 1 rest break to improve activity tolerance  Week 2:  OT Short Term Goal 1 (Week 2): Pt will complete toilet transfer with Min A OT Short Term Goal 1 - Progress (Week 2): Met OT Short Term Goal 2 (Week 2): Pt will complete UB dressing with Min A OT Short Term Goal 2 - Progress (Week 2): Progressing toward goal (met inconsistently) OT Short Term Goal 3 (Week 2): Pt will sequence 4/10 steps during bathing with mod vcs OT Short Term Goal 3 - Progress (Week 2): Met OT Short Term Goal 4 (Week 2): Pt will complete LB dressing with Mod A OT Short Term Goal 4 - Progress (Week 2): Met Week 3:  OT Short Term Goal 1 (Week 3): Pt will complete UB dressing with min assist 4/5 times OT Short Term Goal 2 (Week 3): Pt will complete toilet transfers with RW with min assist OT Short Term Goal 3 (Week 3): Pt will complete bathing with min assist OT Short Term Goal 4 (Week 3): Pt will complete 1 grooming task in standing with supervision Week 4:     Skilled Therapeutic Interventions/Progress Updates:   Pt participated in skilled OT session focusing on L NMR and motor planning. Pt was received while toileting with nursing and was agreeable to make up extra therapy time. Pt was able to complete hygiene mgt and assist with lifting pants over right hip. Pt transferred back to w/c with steady assist. Pt  then completed w/c obstacle course in dayroom with pt navigating around corners and changing directions. Pt required max multimodal cues for technique and attention. Extra time provided for problem solving and processing. At end of session pt was returned to room and left in w/c with safety belt and all needs within reach.    Therapy Documentation Precautions:  Precautions Precautions: Fall Restrictions Weight Bearing Restrictions: No   Pain: No c/o pain during session    ADL: ADL ADL Comments: Please see functional navigator for ADL levels    See Function Navigator for Current Functional Status.   Therapy/Group: Individual Therapy  Satchel Heidinger A Fara Worthy 07/25/2016, 5:18 PM

## 2016-07-25 NOTE — Progress Notes (Addendum)
[place] PHYSICAL MEDICINE & REHABILITATION     PROGRESS NOTE    Subjective/Complaints: Pt states he slept well overnight, except for the fact that he had to use the toilet.    ROS: Denies CP, SOB, N/V/D.  Objective: Vital Signs: Blood pressure 101/74, pulse 73, temperature 97.8 F (36.6 C), temperature source Oral, resp. rate 17, height 6' (1.829 m), weight 81.2 kg (179 lb 0.2 oz), SpO2 100 %. No results found.  Recent Labs  07/22/16 1612  WBC 12.2*  HGB 11.9*  HCT 36.5*  PLT 191    Recent Labs  07/22/16 1612  NA 136  K 4.6  CL 110  GLUCOSE 105*  BUN 17  CREATININE 1.12  CALCIUM 9.2   CBG (last 3)  No results for input(s): GLUCAP in the last 72 hours.  Wt Readings from Last 3 Encounters:  07/25/16 81.2 kg (179 lb 0.2 oz)  06/30/16 103.8 kg (228 lb 14.4 oz)  06/16/16 92.6 kg (204 lb 3.2 oz)    Physical Exam:  Constitutional: He appears well-developed and well-nourished. NAD. HENT: Normocephalic and atraumatic.  Eyes: EOMI. No discharge.  Cardiovascular: Normal rate.  An irregular rhythm present. No murmur heard. Respiratory: Effort normal. No stridor. No respiratory distress.   GI: Soft. Bowel sounds normal There is no tenderness.  Neurological: He is alert and oriented.  Left facial weakness with minimal dysarthria  Fair insight and awareness.  He is able to follow simple one and two step motor commands. Motor: LUE: 4-/5 deltoid, bicep, tricep, wrist, hand LLE: 3/5 HF, KE, tr ADF/PF.  Skin: Skin is warm and dry. No rash noted.  BLE with dry flaky skin.  Psychiatric: His affect is blunt. His speech is delayed. He is slowed.   Assessment/Plan: 1. 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 Physiatrist is providing close team supervision and 24 hour management of active medical problems listed below. Physiatrist and  rehab team continue to assess barriers to discharge/monitor patient progress toward functional and medical goals.  Function:  Bathing Bathing position Bathing activity did not occur: Refused Position: Wheelchair/chair at sink  Bathing parts Body parts bathed by patient: Right arm, Left arm, Chest, Abdomen, Right upper leg, Left upper leg (pt reports RN assisted with perineal area and buttocks ) Body parts bathed by helper: Back  Bathing assist Assist Level:  (Mod Assist)      Upper Body Dressing/Undressing Upper body dressing   What is the patient wearing?: Pull over shirt/dress     Pull over shirt/dress - Perfomed by patient: Thread/unthread right sleeve, Thread/unthread left sleeve, Put head through opening Pull over shirt/dress - Perfomed by helper: Pull shirt over trunk        Upper body assist Assist Level:  (Min assist)      Lower Body Dressing/Undressing Lower body dressing   What is the patient wearing?: Pants, Shoes, [employer] - Performed by helper: Thread/unthread right underwear leg, Thread/unthread left underwear leg, Pull underwear up/down Pants- Performed by patient: Thread/unthread right pants leg Pants- Performed by helper: Thread/unthread left pants leg, Pull pants up/down Non-skid slipper socks- Performed by patient: Don/doff left sock Non-skid slipper socks- Performed by helper: Don/doff right sock   Socks - Performed by helper: Don/doff right sock, Don/doff left sock Shoes - Performed by patient: Don/doff left shoe Shoes - Performed by helper: Fasten right, Fasten left, Don/doff  right shoe       TED Hose - Performed by helper: Don/doff right TED hose, Don/doff left TED hose  Lower body assist Assist for lower body dressing:  (Max assist)      Toileting Toileting Toileting activity did not occur: No continent bowel/bladder event Toileting steps completed by patient: Performs perineal hygiene Toileting steps completed by helper: Adjust  clothing prior to toileting, Adjust clothing after toileting Toileting Assistive Devices: Grab bar or rail  Toileting assist Assist level:  (Max assist)   Transfers Chair/bed transfer   Chair/bed transfer method: Stand pivot Chair/bed transfer assist level: Touching or steadying assistance (Pt > 75%) Chair/bed transfer assistive device: Armrests, Walker Mechanical lift: Ecologist     Max distance: 10' Assist level: Touching or steadying assistance (Pt > 75%)   Wheelchair   Type: Manual Max wheelchair distance: 25' Assist Level: Moderate assistance (Pt 50 - 74%)  Cognition Comprehension Comprehension assist level: Follows basic conversation/direction with no assist  Expression Expression assist level: Expresses complex ideas: With extra time/assistive device  Social Interaction Social Interaction assist level: Interacts appropriately with others - No medications needed., Interacts appropriately 75 - 89% of the time - Needs redirection for appropriate language or to initiate interaction.  Problem Solving Problem solving assist level: Solves basic 50 - 74% of the time/requires cueing 25 - 49% of the time  Memory Memory assist level: Recognizes or recalls 75 - 89% of the time/requires cueing 10 - 24% of the time   Medical Problem List and Plan: 1.  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 2.  DVT Prophylaxis/Anticoagulation:   Lovenox 3. Pain Management: tylenol prn. Support limbs as needed to maintain joint position and prevent contracture 4. Mood: team to follow for ego support. SW to assess upon admit.  5. Neuropsych: This patient is not yet capable of making decisions on his own behalf. 6. Skin/Wound Care: encourage nutrition and pressure relief as needed 7. Fluids/Electrolytes/Nutrition:  variable  8. Afib: ASA daily             -metoprolol increased to 50mg  TID. Appreciate cardiology  consult,monitor hypotension Vitals:   07/24/16 2055 07/25/16 0519  BP: 112/74 101/74  Pulse: 92 73  Resp:  17  Temp:  97.8 F (36.6 C)              -adjust regimen as needed 9. AKI: Resolved.  Creatinine looks to be at a baseline               -continue to push pos  -hypokalemia  resolved with supplementation, has been on spironolactone and potassium and Lasix at home.  Last potassium normal.   BMP Latest Ref Rng & Units 07/22/2016 07/20/2016 07/16/2016  Glucose 65 - 99 mg/dL 105(H) 93 130(H)  BUN 6 - 20 mg/dL 17 13 7   Creatinine 0.61 - 1.24 mg/dL 1.12 1.29(H) 1.23  Sodium 135 - 145 mmol/L 136 137 138  Potassium 3.5 - 5.1 mmol/L 4.6 4.6 4.2  Chloride 101 - 111 mmol/L 110 110 104  CO2 22 - 32 mmol/L 19(L) 21(L) 25  Calcium 8.9 - 10.3 mg/dL 9.2 9.0 8.6(L)          12. Cirrhosis of Liver.             -monitor LFT's and ammonia as needed                    -  no evidence of encephalopathy. At the current time 63. HTN--metoprolol, Aldactone 14.  CHF ef 40-45%    lower extremity edema resolved on diuretics. Reduce Lasix10 mg once a day and spironolactone 12.5 mg per day, goal is for blood pressure to run higher to allow beta blocker to increase, Appreciate cardiology eval 15. Leukocytosis  WBCs 12.2 on 10.8  Labs ordered for Monday 16. ABLA   Hb 11.9 on 10/18, improving  Cont to monitor   LOS (Days) 21 A FACE TO FACE EVALUATION WAS PERFORMED  Kenichi Cassada Lorie Phenix 07/25/2016 10:12 AM

## 2016-07-26 ENCOUNTER — Inpatient Hospital Stay (HOSPITAL_COMMUNITY): Payer: Medicare Other

## 2016-07-26 NOTE — Progress Notes (Signed)
[facility]     PROGRESS NOTE    Subjective/Complaints: Pt laying in bed, awake.  He slept well overnight.  He does not have any complaints.   ROS: Denies CP, SOB, N/V/D.  Objective: Vital Signs: Blood pressure 106/75, pulse 82, temperature 98.1 F (36.7 C), temperature source Oral, resp. rate 18, height 6' (1.829 m), weight 81.9 kg (180 lb 8.9 oz), SpO2 100 %. No results found. No results for input(s): WBC, HGB, HCT, PLT in the last 72 hours. No results for input(s): NA, K, CL, GLUCOSE, BUN, CREATININE, CALCIUM in the last 72 hours.  Invalid input(s): CO CBG (last 3)  No results for input(s): GLUCAP in the last 72 hours.  Wt Readings from Last 3 Encounters:  07/26/16 81.9 kg (180 lb 8.9 oz)  06/30/16 103.8 kg (228 lb 14.4 oz)  06/16/16 92.6 kg (204 lb 3.2 oz)    Physical Exam:  Constitutional: He appears well-developed and well-nourished. NAD. HENT: Normocephalic and atraumatic.  Eyes: EOMI. No discharge.  Cardiovascular: Normal rate.  An irregular rhythm present. No murmur heard. Respiratory: Effort normal. No stridor. No respiratory distress.   GI: Soft. Bowel sounds normal There is no tenderness.  Neurological: He is alert and oriented.  Left facial weakness with minimal dysarthria  Fair insight and awareness.  He is able to follow simple one and two step motor commands. Motor: LUE: 4-/5 deltoid, bicep, tricep, wrist, hand LLE: 3/5 HF, KE, tr ADF/PF.  Skin: Skin is warm and dry. No rash noted.  BLE with dry flaky skin.  Psychiatric: His affect is blunt. He is slowed.   Assessment/Plan: 1. 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 Physiatrist is providing close team supervision and 24 hour management of active medical problems listed below. Physiatrist and rehab team continue to assess barriers  to discharge/monitor patient progress toward functional and medical goals.  Function:  Bathing Bathing position Bathing activity did not occur: Refused Position: Wheelchair/chair at sink  Bathing parts Body parts bathed by patient: Right arm, Left arm, Chest, Abdomen, Right upper leg, Left upper leg (pt reports RN assisted with perineal area and buttocks ) Body parts bathed by helper: Back  Bathing assist Assist Level:  (Mod Assist)      Upper Body Dressing/Undressing Upper body dressing   What is the patient wearing?: Pull over shirt/dress     Pull over shirt/dress - Perfomed by patient: Thread/unthread right sleeve, Thread/unthread left sleeve, Put head through opening Pull over shirt/dress - Perfomed by helper: Pull shirt over trunk        Upper body assist Assist Level:  (Min assist)      Lower Body Dressing/Undressing Lower body dressing   What is the patient wearing?: Pants, Shoes, [employer] - Performed by helper: Thread/unthread right underwear leg, Thread/unthread left underwear leg, Pull underwear up/down Pants- Performed by patient: Thread/unthread right pants leg Pants- Performed by helper: Thread/unthread left pants leg, Pull pants up/down Non-skid slipper socks- Performed by patient: Don/doff left sock Non-skid slipper socks- Performed by helper: Don/doff right sock   Socks - Performed by helper: Don/doff right sock, Don/doff left sock Shoes - Performed by patient: Don/doff left shoe Shoes - Performed by helper: Fasten right, Fasten left, Don/doff right shoe       TED Hose - Performed by helper: Don/doff right TED hose, Don/doff left TED  hose  Lower body assist Assist for lower body dressing:  (Max assist)      Toileting Toileting Toileting activity did not occur: No continent bowel/bladder event Toileting steps completed by patient: Performs perineal hygiene Toileting steps completed by helper: Adjust clothing after toileting Toileting  Assistive Devices: Grab bar or rail  Toileting assist Assist level:  (Max assist)   Transfers Chair/bed transfer   Chair/bed transfer method: Stand pivot Chair/bed transfer assist level: Touching or steadying assistance (Pt > 75%) Chair/bed transfer assistive device: Armrests, Walker Mechanical lift: Ecologist     Max distance: 10' Assist level: Touching or steadying assistance (Pt > 75%)   Wheelchair   Type: Manual Max wheelchair distance: 25' Assist Level: Moderate assistance (Pt 50 - 74%)  Cognition Comprehension Comprehension assist level: Follows basic conversation/direction with no assist  Expression Expression assist level: Expresses basic needs/ideas: With extra time/assistive device  Social Interaction Social Interaction assist level: Interacts appropriately 90% of the time - Needs monitoring or encouragement for participation or interaction.  Problem Solving Problem solving assist level: Solves basic 50 - 74% of the time/requires cueing 25 - 49% of the time  Memory Memory assist level: Recognizes or recalls 75 - 89% of the time/requires cueing 10 - 24% of the time   Medical Problem List and Plan: 1.  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 2.  DVT Prophylaxis/Anticoagulation:   Lovenox 3. Pain Management: tylenol prn. Support limbs as needed to maintain joint position and prevent contracture 4. Mood: team to follow for ego support. SW to assess upon admit.  5. Neuropsych: This patient is not yet capable of making decisions on his own behalf. 6. Skin/Wound Care: encourage nutrition and pressure relief as needed 7. Fluids/Electrolytes/Nutrition:  variable  8. Afib: ASA daily             -metoprolol increased to 50mg  TID. Appreciate cardiology consult,monitor hypotension Vitals:   07/25/16 2240 07/26/16 0604  BP: 112/74 106/75  Pulse: 76 82  Resp:  18  Temp:  98.1 F (36.7 C)               -adjust regimen as needed 9. AKI: Resolved.  Creatinine looks to be at a baseline               -continue to push pos  -hypokalemia  resolved with supplementation, has been on spironolactone and potassium and Lasix at home.  Last potassium normal.   BMP Latest Ref Rng & Units 07/22/2016 07/20/2016 07/16/2016  Glucose 65 - 99 mg/dL 105(H) 93 130(H)  BUN 6 - 20 mg/dL 17 13 7   Creatinine 0.61 - 1.24 mg/dL 1.12 1.29(H) 1.23  Sodium 135 - 145 mmol/L 136 137 138  Potassium 3.5 - 5.1 mmol/L 4.6 4.6 4.2  Chloride 101 - 111 mmol/L 110 110 104  CO2 22 - 32 mmol/L 19(L) 21(L) 25  Calcium 8.9 - 10.3 mg/dL 9.2 9.0 8.6(L)          12. Cirrhosis of Liver.             -monitor LFT's and ammonia as needed                    -no evidence of encephalopathy. At the current time 57. HTN--metoprolol, Aldactone 14.  CHF ef 40-45%    lower extremity edema resolved on diuretics. Reduce Lasix10 mg once a day and spironolactone 12.5 mg  per day, goal is for blood pressure to run higher to allow beta blocker to increase, Appreciate cardiology eval 15. Leukocytosis  WBCs 12.2 on 10.8  Labs ordered for Monday 16. ABLA   Hb 11.9 on 10/18, improving  Cont to monitor  Labs ordered for Monday   LOS (Days) 22 A FACE TO FACE EVALUATION WAS PERFORMED  Kiyanna Biegler Lorie Phenix 07/26/2016 8:50 AM

## 2016-07-26 NOTE — Progress Notes (Signed)
Occupational Therapy Session Note  Patient Details  Name: Nathan Frank MRN: [id number] Date of Birth: Sep 04, 1941  Today's Date: 07/26/2016 OT Group Time:1300-1345    Short Term Goals: Week 3:  OT Short Term Goal 1 (Week 3): Pt will complete UB dressing with min assist 4/5 times OT Short Term Goal 2 (Week 3): Pt will complete toilet transfers with RW with min assist OT Short Term Goal 3 (Week 3): Pt will complete bathing with min assist OT Short Term Goal 4 (Week 3): Pt will complete 1 grooming task in standing with supervision  Skilled Therapeutic Interventions/Progress Updates:   Patient engaged in 62 group activity with focus on problem-solving, attention, initiation and new learning using Nintendo Wii game system.   Pt was able to progress through 3-point competition 10% of the time, with hand-over-hand guidance required due to inability to coordinate visual feedback with required motor skills using controller in his hand.   Pt then participated in alternate activity of ball toss using veclro gloves and ball with appropriate attention but delayed reaction while catching ball with need for repeated instructions on positioning his hand, while seated in w/c.  Pt was escorted back to his room at end of session and assisted back to bed by rehab tech.  Therapy Documentation Precautions:  Precautions Precautions: Fall Restrictions Weight Bearing Restrictions: No   Vital Signs: Therapy Vitals Temp: 98.1 F (36.7 C) Temp Source: Oral Pulse Rate: 82 Resp: 18 BP: 106/75 Patient Position (if appropriate): Lying Oxygen Therapy SpO2: 100 % O2 Device: Not Delivered   Pain: No/denies pain     ADL: ADL ADL Comments: Please see functional navigator for ADL levels   Exercises:     Other Treatments:    See Function Navigator for Current Functional Status.   Therapy/Group: Group Therapy  Alireza Pollack 07/26/2016, 7:52 AM

## 2016-07-27 ENCOUNTER — Inpatient Hospital Stay (HOSPITAL_COMMUNITY): Payer: Self-pay | Admitting: Physical Therapy

## 2016-07-27 ENCOUNTER — Inpatient Hospital Stay (HOSPITAL_COMMUNITY): Payer: Medicare Other | Admitting: Occupational Therapy

## 2016-07-27 ENCOUNTER — Inpatient Hospital Stay (HOSPITAL_COMMUNITY): Payer: Medicare Other | Admitting: Speech Pathology

## 2016-07-27 LAB — BASIC METABOLIC PANEL
Anion gap: 9 (ref 5–15)
BUN: 13 mg/dL (ref 6–20)
CHLORIDE: 110 mmol/L (ref 101–111)
CO2: 18 mmol/L — AB (ref 22–32)
Calcium: 9.2 mg/dL (ref 8.9–10.3)
Creatinine, Ser: 1.11 mg/dL (ref 0.61–1.24)
GFR calc Af Amer: >60 mL/min (ref >60)
GFR calc non Af Amer: >60 mL/min (ref >60)
GLUCOSE: 104 mg/dL — AB (ref 65–99)
POTASSIUM: 4.6 mmol/L (ref 3.5–5.1)
Sodium: 137 mmol/L (ref 135–145)

## 2016-07-27 LAB — CBC WITH DIFFERENTIAL/PLATELET
BASOS PCT: 1 %
Basophils Absolute: 0 10*3/uL (ref 0.0–0.1)
EOS PCT: 1 %
Eosinophils Absolute: 0 10*3/uL (ref 0.0–0.7)
HEMATOCRIT: 36.7 % — AB (ref 39.0–52.0)
Hemoglobin: 12.3 g/dL — ABNORMAL LOW (ref 13.0–17.0)
LYMPHS ABS: 1.9 10*3/uL (ref 0.7–4.0)
Lymphocytes Relative: 41 %
MCH: 30.4 pg (ref 26.0–34.0)
MCHC: 33.5 g/dL (ref 30.0–36.0)
MCV: 90.6 fL (ref 78.0–100.0)
MONOS PCT: 14 %
Monocytes Absolute: 0.6 10*3/uL (ref 0.1–1.0)
NEUTROS ABS: 2.1 10*3/uL (ref 1.7–7.7)
Neutrophils Relative %: 43 %
Platelets: 244 10*3/uL (ref 150–400)
RBC: 4.05 MIL/uL — AB (ref 4.22–5.81)
RDW: 15.1 % (ref 11.5–15.5)
WBC: 4.6 10*3/uL (ref 4.0–10.5)

## 2016-07-27 MED ORDER — AMITRIPTYLINE HCL 10 MG PO TABS
10.0000 mg | ORAL_TABLET | Freq: Every day | ORAL | Status: DC
Start: 2016-07-27 — End: 2016-07-30
  Administered 2016-07-27 – 2016-07-29 (×3): 10 mg via ORAL
  Filled 2016-07-27 (×3): qty 1

## 2016-07-27 MED ORDER — AMITRIPTYLINE HCL 25 MG PO TABS: 25.0000 mg | ORAL_TABLET | Freq: Every day | ORAL | Status: DC

## 2016-07-27 NOTE — Progress Notes (Signed)
Recreational Therapy Session Note  Patient Details  Name: Nathan Frank MRN: [id number] Date of Birth: [birth year] Today's Date: 07/27/2016  Pain: no c/o Skilled Therapeutic Interventions/Progress Updates: Session focused on activity tolerance, sit <--> stands, dynamic standing balance reaching outside BOS for horseshoe game with supervision.  Discharge planning discussion with pt during seated rest breaks in reference to use of leisure time, staying active and community pursuits.  [place] 07/27/2016, 12:23 PM

## 2016-07-27 NOTE — Progress Notes (Signed)
Physical Therapy Session Note  Patient Details  Name: Nathan Frank MRN: [id number] Date of Birth: 07/07/1941  Today's Date: 07/27/2016 PT Individual Time: 1000-1100 PT Individual Time Calculation (min): 60 min    Short Term Goals: Week 3:  PT Short Term Goal 1 (Week 3): =LTGs due to ELOS   Skilled Therapeutic Interventions/Progress Updates:    Pt resting in w/c on arrival, no c/o pain, and agreeable to therapy session.  Session focus on sit<>stands, gait training, dynamic standing balance, and activity tolerance.    Repeated sit<>stand focus on initiation and controlled descent x10 reps reaching just outside BOS for horseshoes.  Rest breaks as needed.    Gait training x95' with RW and supervision with min verbal cues for pacing and breathing.  Gait training up ramp and across compliant surface with close supervision and verbal cues for L step length.  Pt required extended rest break following due to SOB.    Pt returned to room at end of session and performed ambulatory transfer back to bed.  Pt left sitting EOB with call bell in reach, bed alarm activated, and needs met.   Therapy Documentation Precautions:  Precautions Precautions: Fall Restrictions Weight Bearing Restrictions: No   See Function Navigator for Current Functional Status.   Therapy/Group: Individual Therapy  Azel Gumina E Penven-Crew 07/27/2016, 11:02 AM

## 2016-07-27 NOTE — Progress Notes (Signed)
Occupational Therapy Weekly Progress Note  Patient Details  Name: Nathan Frank MRN: [id number] Date of Birth: [birth year]  Beginning of progress report period: July 20, 2016 End of progress report period: July 27, 2016  Today's Date: 07/27/2016 OT Individual Time: 9983-3825 OT Individual Time Calculation (min): 60 min    Patient has met 3 of 3 short term goals.  Pt is making slow progress towards goals.  Pt currently requires increased time for initiation during functional mobility and self-care tasks.  Pt requires mod multimodal cues for orientation and sequencing during dressing tasks secondary to visual perceptual deficits as well as decreased attention and initiation.  Pt currently supervision with ambulation and transfers with RW, may require up to mod assist for sit > stand from lower surfaces without arm rests.  Recommending 3 in 1 for toilet to elevate surface as well as provide arm rests to decrease burden of care.  [name] has observed one therapy session, would benefit from attending additional session for hands on training prior to d/c home.  Patient continues to demonstrate the following deficits: impaired cognition with decreased initiation, orientation, sequencing, Lt hemiparesis, visual perceptual deficits, impaired standing balance, and activity tolerance with elevated HR and low BP and therefore will continue to benefit from skilled OT intervention to enhance overall performance with BADL and Reduce care partner burden.  Patient progressing toward long term goals..  Continue plan of care. Modified goals to include supervision/cues as pt continues to require cues for initiation, problem solving, and sequencing with ADLs  OT Short Term Goals Week 3:  OT Short Term Goal 1 (Week 3): Pt will complete UB dressing with min assist 4/5 times OT Short Term Goal 2 (Week 3): Pt will complete toilet transfers with RW with min assist OT Short Term Goal 2 - Progress (Week 3):  Met OT Short Term Goal 3 (Week 3): Pt will complete bathing with min assist OT Short Term Goal 3 - Progress (Week 3): Met OT Short Term Goal 4 (Week 3): Pt will complete 1 grooming task in standing with supervision OT Short Term Goal 4 - Progress (Week 3): Met Week 4:  OT Short Term Goal 1 (Week 4): STG = LTGs due to remaining LOS   Skilled Therapeutic Interventions/Progress Updates:    Treatment session with focus on initiation, sequencing, problem solving, and functional mobility during self-care tasks.  Pt in bed upon arrival attempting to use urinal, required setup and removal of urinal due to decreased initiation and problem solving.  Once seated on EOB, pt reports need to have BM.  Ambulated to bathroom with RW and supervision, pt completing transfer and hygiene with only setup assist to provide with wash cloth for thoroughness of hygiene.  Pt declined bathing, except for washing buttocks post toileting.  Donned pants with increased time and mod cues for sequencing and problem solving, requiring assist to start threading LLE with pt able to complete.  Sit > stand with supervision for pt to pull pants over hips, only providing cues to complete fully prior to sitting.  Donned shoes with setup assist with pt able to don Lt shoe with use of long handled shoe horn, still requiring mod multimodal cues for sequencing and problem solving during LB dressing tasks.     Therapy Documentation Precautions:  Precautions Precautions: Fall Restrictions Weight Bearing Restrictions: No Pain:  Pt with no c/o pain  See Function Navigator for Current Functional Status.   Therapy/Group: Individual Therapy  Simonne Come 07/27/2016, 8:57  AM  

## 2016-07-27 NOTE — Progress Notes (Signed)
Speech Language Pathology Daily Session Note  Patient Details  Name: Nathan Frank MRN: [id number] Date of Birth: [birth year]  Today's Date: 07/27/2016 SLP Individual Time: [id number] SLP Individual Time Calculation (min): 30 min   Short Term Goals: Week 3: SLP Short Term Goal 1 (Week 3): Patient will initiate functional tasks with no more than 2 repetition cues in 75% of given opportunities. SLP Short Term Goal 2 (Week 3): Patient will demonstrate functional problem solving during basic tasks given Min A verbal cues.  SLP Short Term Goal 3 (Week 3): Patient will demonstrate intellectual awareness of 2 cognitive and 2 physical deficits given Min A verbal cues.  SLP Short Term Goal 4 (Week 3): Pt will utilize external aids to facilitate recall of daily information with min assist.   Skilled Therapeutic Interventions:  Pt was seen for skilled ST targeting cognitive goals.  Therapist facilitated the session with a loosely structured functional task targeting goals for use of external aids for memory.  Pt utilized newspaper headings to assist in recall of details with supervision verbal cues while locating information verbally provided by therapist.  Pt with noticeably improved processing speed today in comparison to previous therapy sessions and needed no extra cues for verbal or task initiation.  Suspect that tasks were centered around pt's personal interests to be impacting function to some extent.  Pt left in bed with call bell within reach.  Continue per current plan of care.    Function:  Eating Eating              Cognition Comprehension Comprehension assist level: Follows basic conversation/direction with extra time/assistive device  Expression   Expression assist level: Expresses basic needs/ideas: With extra time/assistive device  Social Interaction Social Interaction assist level: Interacts appropriately 75 - 89% of the time - Needs redirection for appropriate language or to  initiate interaction.  Problem Solving Problem solving assist level: Solves basic 75 - 89% of the time/requires cueing 10 - 24% of the time  Memory Memory assist level: Recognizes or recalls 75 - 89% of the time/requires cueing 10 - 24% of the time    Pain Pain Assessment Pain Assessment: No/denies pain  Therapy/Group: Individual Therapy  Adelene Polivka, Selinda Orion 07/27/2016, 4:26 PM

## 2016-07-27 NOTE — Progress Notes (Signed)
Physical Therapy Session Note  Patient Details  Name: Nathan Frank MRN: [id number] Date of Birth: 10/17/1940  Today's Date: 07/27/2016 PT Individual Time: 2263-3354 PT Individual Time Calculation (min): 30 min  and Today's Date: 07/27/2016 PT Missed Time: 15 Minutes Missed Time Reason: Patient fatigue    Short Term Goals: Week 3:  PT Short Term Goal 1 (Week 3): =LTGs due to ELOS   Skilled Therapeutic Interventions/Progress Updates:    Pt resting in bed on arrival, states he needs to use the restroom.  Pt transitioned supine>sit>stand with RW with distant supervision.  Pt ambulated into bathroom with supervision and min verbal cues for walker positioning.  Pt able to manage clothing prior to toileting and manage hygiene with supervision/set up.  PT assisted with threading BLEs into clean brief and pt able to pull brief up but PT needed to assist with tightening brief.  Pt declining donning pants at this point, requesting to return to bed.  Pt ambulated to sink and washed hands before ambulating back to bed.  Pt declined further therapy 2/2 fatigue.  Positioned supine in bed with supervision and left with call bell in reach and needs met.   Therapy Documentation Precautions:  Precautions Precautions: Fall Restrictions Weight Bearing Restrictions: No   See Function Navigator for Current Functional Status.   Therapy/Group: Individual Therapy  Earnest Conroy Penven-Crew 07/27/2016, 4:32 PM

## 2016-07-27 NOTE — Progress Notes (Signed)
[place] PHYSICAL MEDICINE & REHABILITATION     PROGRESS NOTE    Subjective/Complaints: No issues except sleep, problem is with sleep maintenance no initiation No dizziness  ROS: Denies CP, SOB, N/V/D.  Objective: Vital Signs: Blood pressure (!) 70/52, pulse (!) 107, temperature 98.9 F (37.2 C), temperature source Oral, resp. rate 20, height 6' (1.829 m), weight 81.8 kg (180 lb 5.4 oz), SpO2 100 %. No results found. No results for input(s): WBC, HGB, HCT, PLT in the last 72 hours. No results for input(s): NA, K, CL, GLUCOSE, BUN, CREATININE, CALCIUM in the last 72 hours.  Invalid input(s): CO CBG (last 3)  No results for input(s): GLUCAP in the last 72 hours.  Wt Readings from Last 3 Encounters:  07/27/16 81.8 kg (180 lb 5.4 oz)  06/30/16 103.8 kg (228 lb 14.4 oz)  06/16/16 92.6 kg (204 lb 3.2 oz)    Physical Exam:  Constitutional: He appears well-developed and well-nourished. NAD. HENT: Normocephalic and atraumatic.  Eyes: EOMI. No discharge.  Cardiovascular: Normal rate.  An irregular rhythm present. No murmur heard. Respiratory: Effort normal. No stridor. No respiratory distress.   GI: Soft. Bowel sounds normal There is no tenderness.  Neurological: He is alert and oriented.  Left facial weakness with minimal dysarthria  Fair insight and awareness.  He is able to follow simple one and two step motor commands. Motor: LUE: 4-/5 deltoid, bicep, tricep, wrist, hand LLE: 3/5 HF, KE, tr ADF/PF.  Skin: Skin is warm and dry. No rash noted.  BLE with dry flaky skin.  Psychiatric: His affect is blunt. He is slowed.   Assessment/Plan: 1. 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 Physiatrist is providing close team supervision and 24 hour management of active medical problems listed below. Physiatrist and rehab team continue to assess  barriers to discharge/monitor patient progress toward functional and medical goals.  Function:  Bathing Bathing position Bathing activity did not occur: Refused Position: Wheelchair/chair at sink  Bathing parts Body parts bathed by patient: Right arm, Left arm, Chest, Abdomen, Right upper leg, Left upper leg (pt reports RN assisted with perineal area and buttocks ) Body parts bathed by helper: Back  Bathing assist Assist Level:  (Mod Assist)      Upper Body Dressing/Undressing Upper body dressing   What is the patient wearing?: Pull over shirt/dress     Pull over shirt/dress - Perfomed by patient: Thread/unthread right sleeve, Thread/unthread left sleeve, Put head through opening Pull over shirt/dress - Perfomed by helper: Pull shirt over trunk        Upper body assist Assist Level:  (Min assist)      Lower Body Dressing/Undressing Lower body dressing   What is the patient wearing?: Pants, Shoes, [employer] - Performed by helper: Thread/unthread right underwear leg, Thread/unthread left underwear leg, Pull underwear up/down Pants- Performed by patient: Thread/unthread right pants leg, Pull pants up/down Pants- Performed by helper: Thread/unthread left pants leg Non-skid slipper socks- Performed by patient: Don/doff left sock Non-skid slipper socks- Performed by helper: Don/doff right sock   Socks - Performed by helper: Don/doff right sock, Don/doff left sock Shoes - Performed by patient: Don/doff left shoe Shoes - Performed by helper: Fasten right, Fasten left, Don/doff right shoe       TED Hose - Performed by helper: Don/doff right TED hose, Don/doff left TED hose  Lower  body assist Assist for lower body dressing:  (Mod assist)      Toileting Toileting Toileting activity did not occur: No continent bowel/bladder event Toileting steps completed by patient: Adjust clothing prior to toileting, Performs perineal hygiene, Adjust clothing after toileting Toileting  steps completed by helper: Adjust clothing after toileting Toileting Assistive Devices: Grab bar or rail  Toileting assist Assist level: Set up/obtain supplies   Transfers Chair/bed transfer   Chair/bed transfer method: Ambulatory Chair/bed transfer assist level: Supervision or verbal cues Chair/bed transfer assistive device: Environmental manager lift: Ecologist     Max distance: 10' Assist level: Touching or steadying assistance (Pt > 75%)   Wheelchair   Type: Manual Max wheelchair distance: 25' Assist Level: Moderate assistance (Pt 50 - 74%)  Cognition Comprehension Comprehension assist level: Follows basic conversation/direction with extra time/assistive device  Expression Expression assist level: Expresses basic needs/ideas: With extra time/assistive device  Social Interaction Social Interaction assist level: Interacts appropriately 75 - 89% of the time - Needs redirection for appropriate language or to initiate interaction.  Problem Solving Problem solving assist level: Solves basic 50 - 74% of the time/requires cueing 25 - 49% of the time  Memory Memory assist level: Recognizes or recalls 75 - 89% of the time/requires cueing 10 - 24% of the time   Medical Problem List and Plan: 1.  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 2.  DVT Prophylaxis/Anticoagulation:   Lovenox 3. Pain Management: tylenol prn. Support limbs as needed to maintain joint position and prevent contracture 4. Mood: team to follow for ego support. SW to assess upon admit.  5. Neuropsych: This patient is not yet capable of making decisions on his own behalf. 6. Skin/Wound Care: encourage nutrition and pressure relief as needed 7. Fluids/Electrolytes/Nutrition:  variable  8. Afib: ASA daily             -metoprolol increased to 50mg  TID. Appreciate cardiology consult,monitor hypotension Vitals:   07/26/16 2046 07/27/16 0420   BP: 96/68 (!) 70/52  Pulse: 96 (!) 107  Resp:  20  Temp:  98.9 F (37.2 C)              -low BP but asymptomatic, will recheck for accuracy, D/C lasix, check BMET for hydration status 9. AKI: Resolved.  Creatinine looks to be at a baseline               -continue to push pos  -hypokalemia  resolved with supplementation, has been on spironolactone and potassium and Lasix at home.  Last potassium normal.   BMP Latest Ref Rng & Units 07/22/2016 07/20/2016 07/16/2016  Glucose 65 - 99 mg/dL 105(H) 93 130(H)  BUN 6 - 20 mg/dL 17 13 7   Creatinine 0.61 - 1.24 mg/dL 1.12 1.29(H) 1.23  Sodium 135 - 145 mmol/L 136 137 138  Potassium 3.5 - 5.1 mmol/L 4.6 4.6 4.2  Chloride 101 - 111 mmol/L 110 110 104  CO2 22 - 32 mmol/L 19(L) 21(L) 25  Calcium 8.9 - 10.3 mg/dL 9.2 9.0 8.6(L)          12. Cirrhosis of Liver.             -monitor LFT's and ammonia as needed                    -no evidence of encephalopathy. At the current time 87. HTN--metoprolol, Aldactone 14.  CHF ef 40-45%  lower extremity edema resolved on diuretics. D/C Lasix10 mg once a day and cont spironolactone 12.5 mg per day, goal is for blood pressure to run higher to allow beta blocker to increase, Appreciate cardiology eval 15. Leukocytosis  WBCs 12.2 on 10.8  Labs ordered for 10/23 results pnd 16. ABLA   Hb 11.9 on 10/18, improving  Cont to monitor  Labs ordered for 10/23 results pnd 17.Insomnia trial elavil low dose  LOS (Days) 23 A FACE TO FACE EVALUATION WAS PERFORMED  Alysia Penna E 07/27/2016 10:34 AM

## 2016-07-27 NOTE — Plan of Care (Signed)
Problem: RH Balance Goal: LTG Patient will maintain dynamic standing with ADLs (OT) LTG:  Patient will maintain dynamic standing balance with assist during activities of daily living (OT)   Modified to include cueing as pt will require cues for problem solving and initiation  Problem: RH Grooming Goal: LTG Patient will perform grooming w/assist,cues/equip (OT) LTG: Patient will perform grooming with assist, with/without cues using equipment (OT)  Modified to include cueing as pt will require cues for problem solving and initiation  Problem: RH Bathing Goal: LTG Patient will bathe with assist, cues/equipment (OT) LTG: Patient will bathe specified number of body parts with assist with/without cues using equipment (position)  (OT)  Modified to include cueing as pt will require cues for problem solving and initiation  Problem: RH Dressing Goal: LTG Patient will perform upper body dressing (OT) LTG Patient will perform upper body dressing with assist, with/without cues (OT).  Downgraded as pt continues to require increased assist/cues for orientation and sequencing with UB dressing

## 2016-07-28 ENCOUNTER — Inpatient Hospital Stay (HOSPITAL_COMMUNITY): Payer: Medicare Other | Admitting: Speech Pathology

## 2016-07-28 ENCOUNTER — Inpatient Hospital Stay (HOSPITAL_COMMUNITY): Payer: Self-pay | Admitting: Physical Therapy

## 2016-07-28 ENCOUNTER — Inpatient Hospital Stay (HOSPITAL_COMMUNITY): Payer: Self-pay | Admitting: Occupational Therapy

## 2016-07-28 ENCOUNTER — Inpatient Hospital Stay (HOSPITAL_COMMUNITY): Payer: Medicare Other | Admitting: Occupational Therapy

## 2016-07-28 DIAGNOSIS — I471 Supraventricular tachycardia: Secondary | ICD-10-CM

## 2016-07-28 NOTE — Progress Notes (Addendum)
Speech Language Pathology Daily Make up Session Note   Patient Details  Name: Nathan Frank MRN: [id number] Date of Birth: 01-25-1941  Today's Date: 07/28/2016 SLP Individual Time: 1530-1600 make up time SLP Individual Time Calculation (min): 30 min   Short Term Goals: Week 3: SLP Short Term Goal 1 (Week 3): Patient will initiate functional tasks with no more than 2 repetition cues in 75% of given opportunities. SLP Short Term Goal 2 (Week 3): Patient will demonstrate functional problem solving during basic tasks given Min A verbal cues.  SLP Short Term Goal 3 (Week 3): Patient will demonstrate intellectual awareness of 2 cognitive and 2 physical deficits given Min A verbal cues.  SLP Short Term Goal 4 (Week 3): Pt will utilize external aids to facilitate recall of daily information with min assist.   Skilled Therapeutic Interventions:   Skilled treatment session focused on addressing cognition goals. SLP facilitated session by providing a written external aid for recall of current medications and frequencies.  Patient required Min verbal cues for mental flexibility for understanding rationale for changes in medication from previously.  Patient loaded medication box with Mod verbal cues for problem solving and was able to initiate each pill set with no more than 2 verbal cues in 75% of opportunities.  Continue with current plan of care.   Function:  Cognition Comprehension Comprehension assist level: Follows basic conversation/direction with extra time/assistive device  Expression   Expression assist level: Expresses basic needs/ideas: With extra time/assistive device  Social Interaction Social Interaction assist level: Interacts appropriately 75 - 89% of the time - Needs redirection for appropriate language or to initiate interaction.  Problem Solving Problem solving assist level: Solves basic 75 - 89% of the time/requires cueing 10 - 24% of the time  Memory Memory assist level:  Recognizes or recalls 75 - 89% of the time/requires cueing 10 - 24% of the time    Pain Pain Assessment Pain Assessment: No/denies pain  Therapy/Group: Individual Therapy  Carmelia Roller., CCC-SLP [id number]  [place] 07/28/2016, 9:20 PM

## 2016-07-28 NOTE — Progress Notes (Signed)
[place] PHYSICAL MEDICINE & REHABILITATION     PROGRESS NOTE    Subjective/Complaints: Slept much better last night. He feels great  ROS: Denies CP, SOB, N/V/D.  Objective: Vital Signs: Blood pressure 107/68, pulse (!) 46, temperature 97.8 F (36.6 C), temperature source Oral, resp. rate 18, height 6' (1.829 m), weight 83.2 kg (183 lb 6.8 oz), SpO2 100 %. No results found.  Recent Labs  07/27/16 1119  WBC 4.6  HGB 12.3*  HCT 36.7*  PLT 244    Recent Labs  07/27/16 1119  NA 137  K 4.6  CL 110  GLUCOSE 104*  BUN 13  CREATININE 1.11  CALCIUM 9.2   CBG (last 3)  No results for input(s): GLUCAP in the last 72 hours.  Wt Readings from Last 3 Encounters:  07/28/16 83.2 kg (183 lb 6.8 oz)  06/30/16 103.8 kg (228 lb 14.4 oz)  06/16/16 92.6 kg (204 lb 3.2 oz)    Physical Exam:  Constitutional: He appears well-developed and well-nourished. NAD. HENT: Normocephalic and atraumatic.  Eyes: EOMI. No discharge.  Cardiovascular: Normal rate.  An irregular rhythm present. No murmur heard. Respiratory: Effort normal. No stridor. No respiratory distress.   GI: Soft. Bowel sounds normal There is no tenderness.  Neurological: He is alert and oriented.  Left facial weakness with minimal dysarthria  Fair insight and awareness.  He is able to follow simple one and two step motor commands. Motor: LUE: 4-/5 deltoid, bicep, tricep, wrist, hand LLE: 3+/5 HF, KE, 2- ADF/PF.  Skin: Skin is warm and dry. No rash noted.  BLE with dry flaky skin.  Psychiatric: His affect is blunt. He is slowed.   Assessment/Plan: 1. 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 Physiatrist is providing close team supervision and 24 hour management of active medical problems listed below. Physiatrist and rehab team continue to assess barriers to discharge/monitor  patient progress toward functional and medical goals.  Function:  Bathing Bathing position Bathing activity did not occur: Refused Position: Wheelchair/chair at sink  Bathing parts Body parts bathed by patient: Right arm, Left arm, Chest, Abdomen, Right upper leg, Left upper leg (pt reports RN assisted with perineal area and buttocks ) Body parts bathed by helper: Back  Bathing assist Assist Level:  (Mod Assist)      Upper Body Dressing/Undressing Upper body dressing   What is the patient wearing?: Pull over shirt/dress     Pull over shirt/dress - Perfomed by patient: Thread/unthread right sleeve, Thread/unthread left sleeve, Put head through opening Pull over shirt/dress - Perfomed by helper: Pull shirt over trunk        Upper body assist Assist Level:  (Min assist)      Lower Body Dressing/Undressing Lower body dressing   What is the patient wearing?: Underwear Underwear - Performed by patient: Pull underwear up/down Underwear - Performed by helper: Thread/unthread right underwear leg, Thread/unthread left underwear leg Pants- Performed by patient: Thread/unthread right pants leg, Pull pants up/down Pants- Performed by helper: Thread/unthread left pants leg Non-skid slipper socks- Performed by patient: Don/doff left sock Non-skid slipper socks- Performed by helper: Don/doff right sock   Socks - Performed by helper: Don/doff right sock, Don/doff left sock Shoes - Performed by patient: Don/doff left shoe Shoes - Performed by helper: Fasten right, Fasten left, Don/doff right shoe       TED Hose - Performed by helper:  Don/doff right TED hose, Don/doff left TED hose  Lower body assist Assist for lower body dressing:  (Mod assist)      Toileting Toileting Toileting activity did not occur: No continent bowel/bladder event Toileting steps completed by patient: Adjust clothing prior to toileting, Performs perineal hygiene Toileting steps completed by helper: Adjust clothing  after toileting Toileting Assistive Devices: Grab bar or rail  Toileting assist Assist level: Supervision or verbal cues   Transfers Chair/bed transfer   Chair/bed transfer method: Stand pivot, Ambulatory Chair/bed transfer assist level: Supervision or verbal cues Chair/bed transfer assistive device: Armrests, Environmental manager lift: Ecologist     Max distance: 95 Assist level: Supervision or verbal cues   Wheelchair   Type: Manual Max wheelchair distance: 25' Assist Level: Moderate assistance (Pt 50 - 74%)  Cognition Comprehension Comprehension assist level: Follows basic conversation/direction with extra time/assistive device  Expression Expression assist level: Expresses basic needs/ideas: With extra time/assistive device  Social Interaction Social Interaction assist level: Interacts appropriately 75 - 89% of the time - Needs redirection for appropriate language or to initiate interaction.  Problem Solving Problem solving assist level: Solves basic 75 - 89% of the time/requires cueing 10 - 24% of the time  Memory Memory assist level: Recognizes or recalls 75 - 89% of the time/requires cueing 10 - 24% of the time   Medical Problem List and Plan: 1.  Left hemiparesis and funtctional deficits secondary to right MCA embolic infarct  -cont CIR PT,OT, SLP.team conf in am 2.  DVT Prophylaxis/Anticoagulation:   Lovenox 3. Pain Management: tylenol prn. Support limbs as needed to maintain joint position and prevent contracture 4. Mood: team to follow for ego support. SW to assess upon admit.  5. Neuropsych: This patient is not yet capable of making decisions on his own behalf. 6. Skin/Wound Care: encourage nutrition and pressure relief as needed 7. Fluids/Electrolytes/Nutrition:  variable  8. Afib: ASA daily             -metoprolol increased to 50mg  TID. Appreciate cardiology consult,monitor hypotension Vitals:   07/27/16 2048 07/28/16 0516  BP: 112/78 107/68   Pulse: 88 (!) 46  Resp:  18  Temp:  97.8 F (36.6 C)              -low BP but asymptomatic, will recheck for accuracy, D/C lasix, Normal 07/28/2016 BMET  9. hypokalemia  resolved with supplementation, has been on spironolactone and potassium and Lasix at home. 10/24 potassium normal.   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             -monitor LFT's and ammonia as needed                    -no evidence of encephalopathy. At the current time 29. HTN--metoprolol, Aldactone 14.  CHF ef 40-45%    lower extremity edema resolved on diuretics. D/C Lasix10 mg once a day and cont spironolactone 12.5 mg per day, goal  is for blood pressure to run higher to allow beta blocker to increase, Appreciate cardiology eval 15. Leukocytosis  Resolved, last WBC 10/24 4.6K 16. ABLA   Hb 11.9 on 10/18, improving  Cont to monitor  Labs ordered for 10/23 results pnd 17.Insomnia improved elavil low dose  LOS (Days) 24 A FACE TO FACE EVALUATION WAS PERFORMED  KIRSTEINS,ANDREW E 07/28/2016 9:06 AM

## 2016-07-28 NOTE — Progress Notes (Signed)
Occupational Therapy Session Note  Patient Details  Name: Nathan Frank MRN: [id number] Date of Birth: 1940/11/16  Today's Date: 07/28/2016 OT Individual Time: [id number] OT Individual Time Calculation (min): 55 min     Short Term Goals: Week 4:  OT Short Term Goal 1 (Week 4): STG = LTGs due to remaining LOS  Skilled Therapeutic Interventions/Progress Updates:    Treatment session with focus on initiation, sequencing, and problem solving during self-care tasks.  Engaged in dressing at sit > stand level with pt requiring mod-max cues for sequencing and problem solving when doffing and donning shirt.  Pt with decreased awareness of errors until he got stuck in shirt with attempts to place head through arm hole.  Reoriented shirt and setup with pt donning BUE first and then pulling shirt over head with improved sequencing.  Continue to recommend that pt don pull over and button shirts both over head to decrease confusion.  Donned jeans this session with increased difficulty and decreased sequencing and problem solving.  Required assist to orient pants for pt to thread legs correctly followed by pt pulling pants over legs and then up over hips in standing with assist to adjust fully.  Engaged in sit <> stand x5 with focus on improved initiation and controlled descent when returning to sitting.  Pt completed 5x in 2:14, however noted overall improved initiation as well as control when returning to seated position.  Transferred back to bed at end of session with supervision stand pivot.  Therapy Documentation Precautions:  Precautions Precautions: Fall Restrictions Weight Bearing Restrictions: No General:   Vital Signs: Therapy Vitals Pulse Rate: (!) 126 Pain:  Pt with no c/o pain  See Function Navigator for Current Functional Status.   Therapy/Group: Individual Therapy  Simonne Come 07/28/2016, 12:24 PM

## 2016-07-28 NOTE — Progress Notes (Signed)
Occupational Therapy Session Note  Patient Details  Name: Nathan Frank MRN: [id number] Date of Birth: [birth year]  Today's Date: 07/28/2016 OT Individual Time: 0800-0830 OT Individual Time Calculation (min): 30 min     Skilled Therapeutic Interventions/Progress Updates:   1:1 self care retraining with focus on bed mobility, functional mobility around the room with RW, toileting, toilet transfers, standing balance, initiation and functional problem solving. Pt able to perform bed mobility with steadying A and functional ambulation to the bathroom with supervision. Pt with loose stool but able to perform toileting (once condom cath removed) with setup with one wipe behind pt to ensure thoroughness. Pt returned to sink to perform grooming. Pt able to pick out clothing without cues with extra time.   Discussed with pt having wife trim his nails- pt with food and dirty under nails- removed all much as possible.  Left in w/c with call bell.   Therapy Documentation Precautions:  Precautions Precautions: Fall Restrictions Weight Bearing Restrictions: No Pain: Pain Assessment Pain Assessment: No/denies pain Pain Score: 0-No pain ADL: ADL ADL Comments: Please see functional navigator for ADL levels  See Function Navigator for Current Functional Status.   Therapy/Group: Individual Therapy  Nathan Frank [facility] 07/28/2016, 10:01 AM

## 2016-07-28 NOTE — Progress Notes (Signed)
Speech Language Pathology Daily Session Note  Patient Details  Name: Nathan Frank MRN: [id number] Date of Birth: November 10, 1940  Today's Date: 07/28/2016 SLP Individual Time: [id number] SLP Individual Time Calculation (min): 45 min   Short Term Goals: Week 3: SLP Short Term Goal 1 (Week 3): Patient will initiate functional tasks with no more than 2 repetition cues in 75% of given opportunities. SLP Short Term Goal 2 (Week 3): Patient will demonstrate functional problem solving during basic tasks given Min A verbal cues.  SLP Short Term Goal 3 (Week 3): Patient will demonstrate intellectual awareness of 2 cognitive and 2 physical deficits given Min A verbal cues.  SLP Short Term Goal 4 (Week 3): Pt will utilize external aids to facilitate recall of daily information with min assist.   Skilled Therapeutic Interventions:  Pt was seen for skilled ST targeting cognitive goals.  Pt was upset that the kitchen had sent him the wrong meal for lunch and was able to problem solve use of his menu to find the phone number to the kitchen, call the kitchen, and request a replacement tray with mod I.  Pt was able to alternate his attention between his meal once it arrived and conversations with the therapist for ~15 minutes in a mildly distracting environment with supervision.  Pt asked for assistance appropriately during tray set up with no cues needed for verbal or task initiation.  Given improved mentation, therapist attempted to challenge pt's higher level attention with a word search task.  During activity pt needed up to max assist verbal cues for problem solving and working memory of task structure (i.e. searching for words horizontally, vertically, or diagonally, searching for words in their entirety versus individual letters).  Pt left in bed at the end of today's therapy session with bed alarm set and call bell within reach.  Continue per current plan of care.   Function:  Eating Eating                  Cognition Comprehension Comprehension assist level: Follows basic conversation/direction with extra time/assistive device  Expression   Expression assist level: Expresses basic needs/ideas: With extra time/assistive device  Social Interaction Social Interaction assist level: Interacts appropriately 75 - 89% of the time - Needs redirection for appropriate language or to initiate interaction.  Problem Solving Problem solving assist level: Solves basic 75 - 89% of the time/requires cueing 10 - 24% of the time  Memory Memory assist level: Recognizes or recalls 75 - 89% of the time/requires cueing 10 - 24% of the time    Pain Pain Assessment Pain Assessment: No/denies pain  Therapy/Group: Individual Therapy  Ananias Kolander, Selinda Orion 07/28/2016, 4:01 PM

## 2016-07-28 NOTE — Progress Notes (Signed)
Physical Therapy Session Note  Patient Details  Name: Nathan Frank MRN: [id number] Date of Birth: 1941/08/16  Today's Date: 07/28/2016 PT Individual Time: 1300-1400 PT Individual Time Calculation (min): 60 min    Short Term Goals: Week 3:  PT Short Term Goal 1 (Week 3): =LTGs due to ELOS   Skilled Therapeutic Interventions/Progress Updates:    Pt resting in bed with no c/o pain and agreeable to therapy session.  Pt taken to dayroom during [place] and engaged in alternating attention task in a highly distracting environment discussing discharge planning and progress towards goals.  Standing balance while engaged in cornhole focus on attaining and maintaining split stance and balance during toss.  Pt returned to room at end of session and transferred back to edge of bed with RW and supervision.  Pt left sitting EOB to await lunch tray, bed alarm activated.  Call bell in reach and needs met.   Therapy Documentation Precautions:  Precautions Precautions: Fall Restrictions Weight Bearing Restrictions: No   See Function Navigator for Current Functional Status.   Therapy/Group: Individual Therapy  Earnest Conroy Penven-Crew 07/28/2016, 2:57 PM

## 2016-07-29 ENCOUNTER — Inpatient Hospital Stay (HOSPITAL_COMMUNITY): Payer: Self-pay | Admitting: Physical Therapy

## 2016-07-29 ENCOUNTER — Inpatient Hospital Stay (HOSPITAL_COMMUNITY): Payer: Medicare Other | Admitting: Occupational Therapy

## 2016-07-29 ENCOUNTER — Inpatient Hospital Stay (HOSPITAL_COMMUNITY): Payer: Medicare Other | Admitting: Speech Pathology

## 2016-07-29 NOTE — Progress Notes (Addendum)
Physical Therapy Session Note  Patient Details  Name: Nathan Frank MRN: [id number] Date of Birth: 11/22/1940  Today's Date: 07/29/2016 PT Individual Time: 0805-0845 PT Individual Time Calculation (min): 40 min    Short Term Goals: Week 3:  PT Short Term Goal 1 (Week 3): =LTGs due to ELOS   Skilled Therapeutic Interventions/Progress Updates:     Patient received semirecumbent in bed and agreeable to PT. RN present for Medication administration.    Patient performed supine>sit with HOB slightly elevated and no cues from PT. Min cues for scooting to EOB and increased reciprocal movement.  Stand pivot transfer to [facility] with RW and supervision Asssist with increased time for initiation of movement.   PT transported patient to rehab gym in [facility] for time management. Car transfer with RW And Supervision Assist from PT, as well as min cues for UE placement and improved initiation of stand>sit coming out of car.   Stair training with Supervision Assist x 12 steps with BUE support. Min cues for proper step to gait pattern and awareness of the LUE to allow improved support .  Patient returned to room and left sitting in on toilet with NT and RN aware of patient location.   Therapy Documentation Precautions:  Precautions Precautions: Fall Restrictions Weight Bearing Restrictions: No General:   Vital Signs: Therapy Vitals Pulse Rate: (!) 125 BP: 97/72   See Function Navigator for Current Functional Status.   Therapy/Group: Individual Therapy  Lorie Phenix 07/29/2016, 9:22 AM

## 2016-07-29 NOTE — Progress Notes (Signed)
Occupational Therapy Discharge Summary  Patient Details  Name: Nathan Frank MRN: [id number] Date of Birth: 1940-11-23   Patient has met 9 of 10 long term goals due to improved activity tolerance, improved balance, postural control, ability to compensate for deficits, improved attention, improved awareness, improved coordination and improved initiation.  Patient to discharge at overall Supervision level for mobility and min assist dressing.  Patient's care partner is independent to provide the necessary physical and cognitive assistance at discharge.    Reasons goals not met: Pt currently requires mod assist with LB dressing when not wearing sweat pants, pt continues to demonstrate visual perceptual deficits and decreased awareness and problem solving to correct errors when not successfully donned during initial attempt.  Pt also reports his wife would typically assist him with tying his shoes.  Recommendation:  Patient will benefit from ongoing skilled OT services in home health setting to continue to advance functional skills in the area of BADL and Reduce care partner burden.  Equipment: 3 in 1  Reasons for discharge: treatment goals met and discharge from hospital  Patient/family agrees with progress made and goals achieved: Yes  OT Discharge Precautions/Restrictions  Precautions Precautions: Fall General   Vital Signs Therapy Vitals Temp: 97.8 F (36.6 C) Temp Source: Oral Pulse Rate: (!) 126 Resp: 17 BP: 95/78 Patient Position (if appropriate): Sitting Oxygen Therapy SpO2: 100 % O2 Device: Not Delivered Pain Pain Assessment Pain Assessment: No/denies pain ADL ADL ADL Comments: Please see functional navigator for ADL levels Vision/Perception  Vision- History Baseline Vision/History: Wears glasses Wears Glasses: Reading only Patient Visual Report: No change from baseline Vision- Assessment Vision Assessment?: Yes Eye Alignment: Within Functional  Limits Tracking/Visual Pursuits: Requires cues, head turns, or add eye shifts to track Visual Fields: Left visual field deficit  Cognition Overall Cognitive Status: Impaired/Different from baseline Arousal/Alertness: Awake/alert Orientation Level: Oriented X4 Attention: Selective Sustained Attention: Appears intact Selective Attention: Impaired Selective Attention Impairment: Verbal complex;Functional complex Alternating Attention: Impaired Alternating Attention Impairment: Verbal complex;Functional complex Memory: Impaired Awareness: Impaired Awareness Impairment: Anticipatory impairment Initiating: Appears intact Safety/Judgment: Appears intact Sensation Sensation Light Touch: Impaired Detail Light Touch Impaired Details: Impaired LLE;Impaired LUE Proprioception: Impaired by gross assessment Coordination Gross Motor Movements are Fluid and Coordinated: Yes Fine Motor Movements are Fluid and Coordinated: Yes Finger Nose Finger Test: R=L, decreased speed but accurate 9 Hole Peg Test: Rt: 1:11 and Lt: 2:19 Motor  Motor Motor: Within Functional Limits Motor - Discharge Observations: balance, postural control, and LLE strength greatly improved from time of evaluation Mobility     Trunk/Postural Assessment  Cervical Assessment Cervical Assessment: Within Functional Limits Thoracic Assessment Thoracic Assessment: Within Functional Limits Lumbar Assessment Lumbar Assessment: Exceptions to [facility] (posterior pelvic tilt preference) Postural Control Postural Control: Within Functional Limits  Balance Static Sitting Balance Static Sitting - Balance Support: No upper extremity supported;Feet supported Static Sitting - Level of Assistance: 6: Modified independent (Device/Increase time) Static Standing Balance Static Standing - Balance Support: During functional activity;Right upper extremity supported;Left upper extremity supported;No upper extremity supported Static Standing -  Level of Assistance: 5: Stand by assistance Dynamic Standing Balance Dynamic Standing - Balance Support: Left upper extremity supported;Right upper extremity supported;No upper extremity supported;During functional activity Dynamic Standing - Level of Assistance: 5: Stand by assistance Extremity/Trunk Assessment RUE Assessment RUE Assessment: Within Functional Limits LUE Assessment LUE Assessment: Exceptions to [facility] (shoulder flexion limited to 90 degrees, utilized abduction to compensate for decreased flexion)   See Function Navigator for Current Functional Status.  Simonne Come 07/29/2016, 1:31 PM

## 2016-07-29 NOTE — Progress Notes (Signed)
Recreational Therapy Discharge Summary Patient Details  Name: Nathan Frank MRN: [id number] Date of Birth: [birth year] Today's Date: 07/29/2016  Long term goals set: 1  Long term goals met: 1  Comments on progress toward goals: Pt has made great progress during LOS and is discharging home tomorrow at supervision level for simple TR tasks.  Pt does require extra time and cuing due to decreased initiation & cognition.  Pt is discharging home with wife to provide/coordinate 24 hour supervision/assistance.   Reasons for discharge: discharge from hospital  Patient/family agrees with progress made and goals achieved: Yes  Reba Hulett 07/29/2016, 9:22 AM

## 2016-07-29 NOTE — Plan of Care (Signed)
Problem: RH Problem Solving Goal: LTG Patient will demonstrate problem solving for (SLP) LTG:  Patient will demonstrate problem solving for basic/complex daily situations with cues  (SLP)  Outcome: Not Met (add Reason) Patient requires Min assist for basic problem solving   Problem: RH Memory Goal: LTG Patient will use memory compensatory aids to (SLP) LTG:  Patient will use memory compensatory aids to recall biographical/new, daily complex information with cues (SLP)  Outcome: Not Met (add Reason) Requires Min assist for recall

## 2016-07-29 NOTE — Progress Notes (Signed)
Speech Language Pathology Discharge Summary and Final Treatment Note  Patient Details  Name: Nathan Frank MRN: [id number] Date of Birth: December 31, 1940  Today's Date: 07/29/2016 SLP Individual Time: 1130-1200 SLP Individual Time Calculation (min): 30 min  Skilled Therapeutic Interventions:  Skilled treatment session focused on addressing cognition goals with continuation of medication management task from yesterday's session.  Patient required increased time and Min verbal cues to utilize external aid to locate where he had left off.  SLP also facilitated session by providing Min verbal cues for self-monitoring and correcting of problem solving task.  Patient able to request assist to open pill bottles during task, but then reported that he did not need any assist with task after discharge home.  However, following Min verbal cues he was agreeable.      Patient has met 6 of 8 long term goals.  Patient to discharge at overall Supervision;Min level.  Reasons goals not met: requires Min assist for recall and basic problem solving    Clinical Impression/Discharge Summary:    Patient has made functional gains during this rehab admission and has met 6 out of 8 long term goals due to improved functional abilities.  Patient is currently an overall Supervision-Min assist for basic-mildly complex cognitive tasks and requires no assist for utilization of swallowing compensatory strategies to minimize overt s/s of aspiration with a regular and thin liquid diet. Patient and family education has been completed per chart review and patient will discharge home with 24 hour supervision. Patient would benefit from follow up SLP services to continue efforts to maximize cognitive-linguistic skills, maximize his functional independence, and further reduce the burden of care.   Care Partner:  Caregiver Able to Provide Assistance: Yes (per report of primary SLP)  Type of Caregiver Assistance:  Cognitive;Physical  Recommendation:  24 hour supervision/assistance;Home Health SLP;Outpatient SLP  Rationale for SLP Follow Up: Maximize functional communication;Maximize cognitive function and independence;Reduce caregiver burden   Equipment: none   Reasons for discharge: Treatment goals met;Discharged from hospital   Patient/Family Agrees with Progress Made and Goals Achieved: Yes   Function:  Cognition Comprehension Comprehension assist level: Follows basic conversation/direction with extra time/assistive device  Expression   Expression assist level: Expresses basic needs/ideas: With extra time/assistive device  Social Interaction Social Interaction assist level: Interacts appropriately 75 - 89% of the time - Needs redirection for appropriate language or to initiate interaction.  Problem Solving Problem solving assist level: Solves basic 75 - 89% of the time/requires cueing 10 - 24% of the time  Memory Memory assist level: Recognizes or recalls 75 - 89% of the time/requires cueing 10 - 24% of the time   Carmelia Roller., CCC-SLP [place] 07/29/2016, 4:58 PM

## 2016-07-29 NOTE — Progress Notes (Signed)
Occupational Therapy Session Note  Patient Details  Name: ARMONE DRAKES MRN: [id number] Date of Birth: 1941/09/21  Today's Date: 07/29/2016 OT Individual Time: 1000-1100 OT Individual Time Calculation (min): 60 min     Short Term Goals: Week 4:  OT Short Term Goal 1 (Week 4): STG = LTGs due to remaining LOS  Skilled Therapeutic Interventions/Progress Updates:    Treatment session with focus on initiation and problem solving during self-care tasks.  Pt received dressed reporting he had dressed himself without assistance.  Encouraged pt to dress again with therapist to check off goals prior to d/c tomorrow.  Pt doffed shirt with min cues for technique but then donned shirt without assistance and well as donned pants without assistance.  Utilized shoe horn for donning shoes, with therapist assisting with tying them (as pt reports he did not tie them before).  Completed tub/shower transfer and toilet transfers in ADL tub room. Pt ambulated into bathroom with RW and supervision, completed tub/shower transfer with use of tub bench with only initial cue for sequencing.  Completed toilet transfer to toilet with BSC overtop to elevate seat height and provide UE support with supervision.  Engaged in [place] in standing with goal to stand for 4 mins for endurance, with pt only able to stand for 2:40 before requesting seated rest break.  Discussed increased standing tolerance and functional carryover into safety with ADLs.  Returned to room and left upright in w/c with all needs in reach.  Therapy Documentation Precautions:  Precautions Precautions: Fall Restrictions Weight Bearing Restrictions: No Pain:  Pt with no c/o pain  See Function Navigator for Current Functional Status.   Therapy/Group: Individual Therapy  Simonne Come 07/29/2016, 12:20 PM

## 2016-07-29 NOTE — Plan of Care (Signed)
Problem: RH Ambulation Goal: LTG Patient will ambulate in controlled environment (PT) LTG: Patient will ambulate in a controlled environment, # of feet with assistance (PT).  Outcome: Adequate for Discharge Pt able to ambulate 77-95' with RW consistently but limited by poor activity tolerance and deconditioning

## 2016-07-29 NOTE — Progress Notes (Signed)
[place] PHYSICAL MEDICINE & REHABILITATION     PROGRESS NOTE    Subjective/Complaints: Slept well, aware of discharge in a.m., working with SLP on medication management  ROS: Denies CP, SOB, N/V/D.  Objective: Vital Signs: Blood pressure 97/72, pulse (!) 125, temperature 98.2 F (36.8 C), temperature source Oral, resp. rate 18, height 6' (1.829 m), weight 87.3 kg (192 lb 8 oz), SpO2 91 %. No results found.  Recent Labs  07/27/16 1119  WBC 4.6  HGB 12.3*  HCT 36.7*  PLT 244    Recent Labs  07/27/16 1119  NA 137  K 4.6  CL 110  GLUCOSE 104*  BUN 13  CREATININE 1.11  CALCIUM 9.2   CBG (last 3)  No results for input(s): GLUCAP in the last 72 hours.  Wt Readings from Last 3 Encounters:  07/29/16 87.3 kg (192 lb 8 oz)  06/30/16 103.8 kg (228 lb 14.4 oz)  06/16/16 92.6 kg (204 lb 3.2 oz)    Physical Exam:  Constitutional: He appears well-developed and well-nourished. NAD. HENT: Normocephalic and atraumatic.  Eyes: EOMI. No discharge.  Cardiovascular: Normal rate.  An irregular rhythm present. No murmur heard. Respiratory: Effort normal. No stridor. No respiratory distress.   GI: Soft. Bowel sounds normal There is no tenderness.  Neurological: He is alert and oriented.  Left facial weakness with minimal dysarthria  Fair insight and awareness.  He is able to follow simple one and two step motor commands. Motor: LUE: 4-/5 deltoid, bicep, tricep, wrist, hand LLE: 3+/5 HF, KE, 2- ADF/PF.  Skin: Skin is warm and dry. No rash noted.  BLE with dry flaky skin.  Psychiatric: His affect is blunt. He is slowed.   Assessment/Plan: 1. 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 Physiatrist is providing close team supervision and 24 hour management of active medical problems listed below. Physiatrist and rehab team continue to assess  barriers to discharge/monitor patient progress toward functional and medical goals.  Function:  Bathing Bathing position Bathing activity did not occur: Refused Position: Wheelchair/chair at sink  Bathing parts Body parts bathed by patient: Right arm, Left arm, Chest, Abdomen, Right upper leg, Left upper leg (pt reports RN assisted with perineal area and buttocks ) Body parts bathed by helper: Back  Bathing assist Assist Level:  (Mod Assist)      Upper Body Dressing/Undressing Upper body dressing   What is the patient wearing?: Pull over shirt/dress     Pull over shirt/dress - Perfomed by patient: Thread/unthread left sleeve, Put head through opening, Pull shirt over trunk Pull over shirt/dress - Perfomed by helper: Thread/unthread right sleeve        Upper body assist Assist Level:  (Min assist)      Lower Body Dressing/Undressing Lower body dressing   What is the patient wearing?: Pants, Shoes, [employer]  - Performed by patient: Pull underwear up/down Underwear - Performed by helper: Thread/unthread right underwear leg, Thread/unthread left underwear leg Pants- Performed by patient: Pull pants up/down Pants- Performed by helper: Thread/unthread right pants leg, Thread/unthread left pants leg Non-skid slipper socks- Performed by patient: Don/doff left sock Non-skid slipper socks- Performed by helper: Don/doff right sock   Socks - Performed by helper: Don/doff right sock, Don/doff left sock Shoes - Performed by patient: Don/doff left shoe, Don/doff right shoe Shoes - Performed by helper: Fasten left, Fasten right  TED Hose - Performed by helper: Don/doff right TED hose, Don/doff left TED hose  Lower body assist Assist for lower body dressing:  (Max assist)      Toileting Toileting Toileting activity did not occur: No continent bowel/bladder event Toileting steps completed by patient: Adjust clothing prior to toileting, Performs perineal hygiene Toileting  steps completed by helper: Adjust clothing after toileting Toileting Assistive Devices: Grab bar or rail  Toileting assist Assist level: Supervision or verbal cues   Transfers Chair/bed transfer   Chair/bed transfer method: Stand pivot Chair/bed transfer assist level: Supervision or verbal cues Chair/bed transfer assistive device: Armrests, Environmental manager lift: Ecologist     Max distance: 95 Assist level: Supervision or verbal cues   Wheelchair   Type: Manual Max wheelchair distance: 25' Assist Level: Moderate assistance (Pt 50 - 74%)  Cognition Comprehension Comprehension assist level: Follows basic conversation/direction with extra time/assistive device  Expression Expression assist level: Expresses basic needs/ideas: With extra time/assistive device  Social Interaction Social Interaction assist level: Interacts appropriately 75 - 89% of the time - Needs redirection for appropriate language or to initiate interaction.  Problem Solving Problem solving assist level: Solves basic 75 - 89% of the time/requires cueing 10 - 24% of the time  Memory Memory assist level: Recognizes or recalls 75 - 89% of the time/requires cueing 10 - 24% of the time   Medical Problem List and Plan: 1.  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 Formulized individual treatment plan based on medical history, underlying problem and comorbidities. 2.  DVT Prophylaxis/Anticoagulation:   Lovenox 3. Pain Management: tylenol prn. Support limbs as needed to maintain joint position and prevent contracture 4. Mood: team to follow for ego support. SW to assess upon admit.  5. Neuropsych: This patient is not yet capable of making decisions on his own behalf. 6. Skin/Wound Care: encourage  nutrition and pressure relief as needed 7. Fluids/Electrolytes/Nutrition:  variable  8. Afib: ASA daily             -metoprolol increased to 81m TID. Appreciate cardiology consult,monitor hypotension Vitals:   07/29/16 0502 07/29/16 0800  BP: (!) 94/58 97/72  Pulse: (!) 130 (!) 125  Resp: 18   Temp: 98.2 F (36.8 C)               -low BP but asymptomatic, will recheck for accuracy, D/C lasix, Normal 07/28/2016 BMET  9. hypokalemia  resolved with supplementation, has been on spironolactone and potassium and Lasix at home. 10/24 potassium normal.   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             -monitor LFT's and ammonia as needed                    -no evidence of encephalopathy.  At the current time 46. HTN--metoprolol, Aldactone 14.  CHF ef 40-45%    lower extremity edema resolved on diuretics. D/C Lasix10 mg once a day and cont spironolactone 12.5 mg per day, goal is for blood pressure to run higher to allow beta blocker to increase, Appreciate cardiology eval 15. Leukocytosis  Resolved, last WBC 10/24 4.6K 16. ABLA   Hb 12.3 on 10/23 improving  Cont to monitor  17.Insomnia improved elavil low dose  LOS (Days) 25 A FACE TO FACE EVALUATION WAS PERFORMED  Alysia Penna E 07/29/2016 11:58 AM

## 2016-07-29 NOTE — Progress Notes (Signed)
Physical Therapy Discharge Summary  Patient Details  Name: Nathan Frank MRN: [id number] Date of Birth: 04/28/1941  Today's Date: 07/29/2016 PT Individual Time: 1300-1355 PT Individual Time Calculation (min): 55 min     Patient has met 5 of 6 long term goals due to improved activity tolerance, improved balance, improved postural control, increased strength, decreased pain, ability to compensate for deficits, improved attention, improved awareness and improved coordination.  Patient to discharge at an ambulatory level Supervision.   Patient's care partner is independent to provide the necessary cognitive assistance at discharge.  Reasons goals not met: Pt with ongoing deficits with activity tolerance and deconditioning which can limit his ability to ambulate longer distances.  Pt has ambulated up to 120' on CIR with supervision, but at time of discharge is able to consistently ambulate 63-90' with RW and supervision.   Recommendation:  Patient will benefit from ongoing skilled PT services in home health setting to continue to advance safe functional mobility, address ongoing impairments in endurance, strength, balance, and coordination, and minimize fall risk.  Equipment: RW and 18x18 w/c with basic cushion  Reasons for discharge: treatment goals met  Patient/family agrees with progress made and goals achieved: Yes   Skilled Therapeutic Intervention: Pt resting in w/c on arrival with no c/o pain and agreeable to therapy session.  Session focus on ambulation with RW, transfers, assessment of balance, strength, and coordination, and pt education regarding upcoming d/c.  PT instructed pt in HEP OTAGO falls prevention x8 reps of LAQ with 5 second hold (mod cues to attend to task), standing hip abduction, minisquats, and heel raises.  Pt returned to room at end of session and positioned in bed with call bell in reach and needs met.   PT  Discharge Precautions/RestrictionsPrecautions Precautions: Fall Pain Pain Assessment Pain Assessment: No/denies pain Vision/Perception  Vision - Assessment Eye Alignment: Within Functional Limits Tracking/Visual Pursuits: Requires cues, head turns, or add eye shifts to track  Cognition Overall Cognitive Status: Impaired/Different from baseline Arousal/Alertness: Awake/alert Orientation Level: Oriented X4 Attention: Selective Sustained Attention: Appears intact Selective Attention: Impaired Selective Attention Impairment: Verbal complex;Functional complex Alternating Attention: Impaired Alternating Attention Impairment: Verbal complex;Functional complex Memory: Impaired Awareness: Impaired Awareness Impairment: Anticipatory impairment Initiating: Appears intact Safety/Judgment: Appears intact Sensation Sensation Light Touch: Impaired Detail Light Touch Impaired Details: Impaired LLE;Impaired LUE Proprioception: Impaired by gross assessment Coordination Gross Motor Movements are Fluid and Coordinated: Yes Fine Motor Movements are Fluid and Coordinated: Yes Finger Nose Finger Test: R=L, decreased speed but accurate 9 Hole Peg Test: Rt: 1:11 and Lt: 2:19 Motor  Motor Motor: Within Functional Limits Motor - Discharge Observations: balance, postural control, and LLE strength greatly improved from time of evaluation  Mobility Bed Mobility Bed Mobility: Sit to Supine;Supine to Sit Rolling Left: 6: Modified independent (Device/Increase time) Supine to Sit: 6: Modified independent (Device/Increase time) Sit to Supine: 6: Modified independent (Device/Increase time) Transfers Transfers: Yes Sit to Stand: 5: Supervision Stand to Sit: 5: Supervision Stand Pivot Transfers: 5: Supervision Locomotion  Ambulation Ambulation: Yes Ambulation/Gait Assistance: 5: Supervision Ambulation Distance (Feet): 82 Feet Assistive device: Rolling walker Ambulation/Gait Assistance Details:  verbal cues for breathing Gait Gait: Yes Gait Pattern: Step-through pattern;Narrow base of support;Decreased stride length Stairs / Additional Locomotion Stairs: Yes Stairs Assistance: 5: Supervision Stair Management Technique: Two rails Number of Stairs: 12 Wheelchair Mobility Wheelchair Mobility: No  Trunk/Postural Assessment  Cervical Assessment Cervical Assessment: Within Functional Limits Thoracic Assessment Thoracic Assessment: Within Functional Limits Lumbar Assessment Lumbar Assessment: Exceptions  to [facility] (posterior pelvic tilt preference) Postural Control Postural Control: Within Functional Limits  Balance Static Sitting Balance Static Sitting - Balance Support: No upper extremity supported;Feet supported Static Sitting - Level of Assistance: 6: Modified independent (Device/Increase time) Static Standing Balance Static Standing - Balance Support: During functional activity;Right upper extremity supported;Left upper extremity supported;No upper extremity supported Static Standing - Level of Assistance: 5: Stand by assistance Dynamic Standing Balance Dynamic Standing - Balance Support: Left upper extremity supported;Right upper extremity supported;No upper extremity supported;During functional activity Dynamic Standing - Level of Assistance: 5: Stand by assistance Extremity Assessment  RUE Assessment RUE Assessment: Within Functional Limits LUE Assessment LUE Assessment: Exceptions to [facility] (shoulder flexion limited to 90 degrees, utilized abduction to compensate for decreased flexion) RLE Assessment RLE Assessment: Exceptions to [facility] RLE PROM (degrees) Overall PROM Right Lower Extremity: Within functional limits for tasks assessed RLE Strength RLE Overall Strength: Within Functional Limits for tasks assessed Right Hip Flexion: 3+/5 Right Knee Flexion: 4-/5 Right Knee Extension: 4+/5 Right Ankle Dorsiflexion: 3+/5 Right Ankle Plantar Flexion: 4/5 LLE Assessment LLE  Assessment: Exceptions to WFL LLE PROM (degrees) Overall PROM Left Lower Extremity: Within functional limits for tasks assessed LLE Strength LLE Overall Strength: Within Functional Limits for tasks assessed Left Hip Flexion: 4/5 Left Knee Flexion: 4/5 Left Knee Extension: 5/5 Left Ankle Dorsiflexion: 4+/5 Left Ankle Plantar Flexion: 4+/5   See Function Navigator for Current Functional Status.  Junelle Hashemi E Penven-Crew 07/29/2016, 1:59 PM

## 2016-07-30 DIAGNOSIS — Z23 Encounter for immunization: Secondary | ICD-10-CM | POA: Diagnosis not present

## 2016-07-30 MED ORDER — AMITRIPTYLINE HCL 10 MG PO TABS: 10.0000 mg | ORAL_TABLET | Freq: Every day | ORAL | 0 refills | Status: DC

## 2016-07-30 MED ORDER — METOPROLOL TARTRATE 50 MG PO TABS: 50.0000 mg | ORAL_TABLET | Freq: Three times a day (TID) | ORAL | 0 refills | Status: DC

## 2016-07-30 MED ORDER — SPIRONOLACTONE 25 MG PO TABS: 12.5000 mg | ORAL_TABLET | Freq: Every day | ORAL | 0 refills | Status: DC

## 2016-07-30 NOTE — Progress Notes (Signed)
Social Work Patient ID: Nathan Frank, male   DOB: November 20, 1940, 75 y.o.   MRN: [id number]   CSW met with pt 07-29-16  to update him on team conference discussion and then later talked with his wife via telephone.  She will have her grandson to come pick them up on day of d/c and she feels ready for pt to come home.  CSW ordered DME and arranged HH through [facility] who had followed him PTA.  No other needs/concerns at this time.  Pt is ready to d/c home.  CSW remains available to assist as needed.

## 2016-07-30 NOTE — Discharge Instructions (Signed)
Inpatient Rehab Discharge Instructions  AOUS MODEL Discharge date and time:  07/30/16  Activities/Precautions/ Functional Status: Activity: no lifting, driving, or strenuous exercise till cleared by MD.  Diet: cardiac diet Wound Care: none needed   Functional status:  ___ No restrictions     ___ Walk up steps independently _X__ 24/7 supervision/assistance   ___ Walk up steps with assistance ___ Intermittent supervision/assistance  ___ Bathe/dress independently _X__ Walk with walker    _X__ Bathe/dress with assistance ___ Walk Independently    ___ Shower independently ___ Walk with assistance    ___ Shower with assistance _X__ No alcohol     ___ Return to work/school ________   COMMUNITY REFERRALS UPON DISCHARGE:   Home Health:   PT     OT     ST     RN     Agency:  [facility] Phone:  [phone] Medical Equipment/Items Ordered:  3-in-1 commode; rolling walker; tub bench; 18"x18" lightweight wheelchair with basic cushion  Agency/Supplier:  [facility]        Phone:  [phone]  GENERAL COMMUNITY RESOURCES FOR PATIENT/FAMILY: Support Groups:  [facility] Stroke Support Group                              Meets the second Thursday of every month at 3 PM (except June, July, and August)                              In the dayroom of [place] [facility] on [address]                              For more information, call Benay Pillow [phone]    Special Instructions:    STROKE/TIA DISCHARGE INSTRUCTIONS  SMOKING Cigarette smoking nearly doubles your risk of having a stroke & is the single most alterable risk factor  If you smoke or have smoked in the last 12 months, you are advised to quit smoking for your health.  Most of the excess cardiovascular risk related to smoking disappears within a year of stopping.  Ask you doctor about anti-smoking medications  [place] Quit Line: 1-800-QUIT NOW  Free Smoking Cessation Classes [phone]    CHOLESTEROL Know your levels; limit fat & cholesterol in your diet  Lipid Panel     Component Value Date/Time   CHOL 87 06/26/2016 0500   TRIG 91 06/27/2016 0219   HDL 26 (L) 06/26/2016 0500   CHOLHDL 3.3 06/26/2016 0500   VLDL 23 06/26/2016 0500   LDLCALC 38 06/26/2016 0500      Many patients benefit from treatment even if their cholesterol is at goal.  Goal: Total Cholesterol (CHOL) less than 160  Goal:  Triglycerides (TRIG) less than 150  Goal:  HDL greater than 40  Goal:  LDL (LDLCALC) less than 100   BLOOD PRESSURE American Stroke Association blood pressure target is less that 120/80 mm/Hg  Your discharge blood pressure is:  BP: 108/75  Monitor your blood pressure  Limit your salt and alcohol intake  Many individuals will require more than one medication for high blood pressure  DIABETES (A1c is a blood sugar average for last 3 months) Goal HGBA1c is under 7% (HBGA1c is blood sugar average for last 3 months)  Diabetes: No known  diagnosis of diabetes    Lab Results  Component Value Date   HGBA1C 4.5 (L) 06/26/2016     Your HGBA1c can be lowered with medications, healthy diet, and exercise.  Check your blood sugar as directed by your physician  Call your physician if you experience unexplained or low blood sugars.  PHYSICAL ACTIVITY/REHABILITATION Goal is 30 minutes at least 4 days per week  Activity: No driving, Therapies: see above Return to work:  N/A  Activity decreases your risk of heart attack and stroke and makes your heart stronger.  It helps control your weight and blood pressure; helps you relax and can improve your mood.  Participate in a regular exercise program.  Talk with your doctor about the best form of exercise for you (dancing, walking, swimming, cycling).  DIET/WEIGHT Goal is to maintain a healthy weight  Your discharge diet is: Diet Heart Room service appropriate? Yes; Fluid consistency: Thin  liquids Your height is:  Height: 6' (182.9  cm) Your current weight is: Weight: 86.4 kg (190 lb 6.4 oz) Your Body Mass Index (BMI) is:  BMI (Calculated): 31.1  Following the type of diet specifically designed for you will help prevent another stroke.  Your goal weight is:  184 lbs  Your goal Body Mass Index (BMI) is 19-24.  Healthy food habits can help reduce 3 risk factors for stroke:  High cholesterol, hypertension, and excess weight.  RESOURCES Stroke/Support Group:  Call [phone]   STROKE EDUCATION PROVIDED/REVIEWED AND GIVEN TO PATIENT Stroke warning signs and symptoms How to activate emergency medical system (call 911). Medications prescribed at discharge. Need for follow-up after discharge. Personal risk factors for stroke. Pneumonia vaccine given:  Flu vaccine given:  My questions have been answered, the writing is legible, and I understand these instructions.  I will adhere to these goals & educational materials that have been provided to me after my discharge from the hospital.     My questions have been answered and I understand these instructions. I will adhere to these goals and the provided educational materials after my discharge from the hospital.  Patient/Caregiver Signature _______________________________ Date __________  Clinician Signature _______________________________________ Date __________  Please bring this form and your medication list with you to all your follow-up doctor's appointments.

## 2016-07-30 NOTE — Consult Note (Addendum)
    [facility] CM Inpatient Consult   07/30/2016  Nathan Frank 1941-02-21 [id number]   Received a referral via voicemail from Claybon Jabs, [place] with [facility] inpatient rehab stating the patient is discharging today and wanted to confirm that [facility] could follow up after discharge.  Patient's chart history reveals that he went to rehab for Left hemiparesis and funtctional deficitssecondary to right MCA embolic infarct.  Patient is to discharge home today.  Call and spoke with Nathan Frank and she states that the patient and family are leaving confirm contact person is wife, Nathan Frank.  A recent active consent is on file.  [facility] will follow up and it does not interfere with any services arranged by the rehab staff.  For questions, please contact:  Natividad Brood, RN BSN [facility] Liaison  [phone] business mobile phone Toll free office [phone]  Best contact number for Nathan Frank is [phone]

## 2016-07-30 NOTE — Discharge Summary (Signed)
Physician Discharge Summary  Patient ID: Nathan Frank MRN: [id number] DOB/AGE: 1941/03/20 75 y.o.  Admit date: 07/04/2016 Discharge date: 07/30/2016  Discharge Diagnoses:  Principal Problem:   Cerebral infarction due to embolism of right middle cerebral artery ([place]) - s/p mechanical thrombectomy Active Problems:   Essential hypertension   Atrial fibrillation with RVR (HCC)   Alcoholic cirrhosis of liver with ascites (HCC)   Acute on chronic diastolic CHF (congestive heart failure) (HCC)   Dysarthria, post-stroke   Anemia of chronic disease   Left hemiparesis (HCC)   Acute ischemic right MCA stroke (HCC)   Atrial tachycardia (HCC)   Acute blood loss anemia   Lymphocytosis   Discharged Condition: stable.  Significant Diagnostic Studies: Dg Chest 2 View  Result Date: 07/21/2016 CLINICAL DATA:  75 year old male with pulmonary edema. EXAM: CHEST  2 VIEW COMPARISON:  Chest radiograph dated 07/09/2016 FINDINGS: Two views of the chest demonstrate clear lungs. There is no pleural effusion or pneumothorax. Top-normal cardiac silhouette. There is osteopenia with degenerative changes of the spine. No acute fracture. IMPRESSION: No acute cardiopulmonary process. Interval resolution of the previously seen vascular congestion. Electronically Signed   By: Anner Crete M.D.   On: 07/21/2016 05:25   Dg Chest 2 View  Result Date: 07/09/2016 CLINICAL DATA:  CHF EXAM: CHEST  2 VIEW COMPARISON:  07/08/2016 FINDINGS: Enlargement cardiac silhouette with slight pulmonary vascular congestion. Atherosclerotic calcification at arch. Mediastinal contours normal for AP technique on from view. Mild bibasilar atelectasis versus infiltrate. Remaining lungs clear. Minimal central peribronchial thickening, chronic. No pleural effusion or pneumothorax. Bones demineralized. IMPRESSION: Enlargement of cardiac silhouette with pulmonary vascular congestion. Bibasilar atelectasis versus infiltrate slightly increased  on LEFT with small bibasilar effusions and mild chronic peribronchial thickening. Electronically Signed   By: Lavonia Dana M.D.   On: 07/09/2016 12:48   Dg Chest 2 View  Result Date: 07/08/2016 CLINICAL DATA:  Shortness of breath with exertion EXAM: CHEST  2 VIEW COMPARISON:  07/03/2016 FINDINGS: Heart is mildly enlarged. Airspace opacity noted posteriorly on the lateral view, likely in the right lower lobe concerning for pneumonia. No effusions. No acute bony abnormality. IMPRESSION: Posterior airspace opacity on the lateral view most compatible with right lower lobe pneumonia. Mild cardiomegaly. Electronically Signed   By: Rolm Baptise M.D.   On: 07/08/2016 16:13   Dg Chest Port 1 View  Result Date: 07/03/2016 CLINICAL DATA:  Patient with congestive heart failure. EXAM: PORTABLE CHEST 1 VIEW COMPARISON:  Chest radiograph 06/25/2016. FINDINGS: Monitoring leads overlie the patient. Patient is rotated. Cardiac contours upper limits of normal. Tortuosity of the thoracic aorta. Heterogeneous opacities within the lower lungs bilaterally. No pleural effusion or pneumothorax. IMPRESSION: Heterogeneous opacities within the lung bases bilaterally favored to represent atelectasis. Aspiration or infection not excluded. Electronically Signed   By: Lovey Newcomer M.D.   On: 07/03/2016 16:25    Labs:  Basic Metabolic Panel: BMP Latest Ref Rng & Units 07/27/2016 07/22/2016 07/20/2016  Glucose 65 - 99 mg/dL 104(H) 105(H) 93  BUN 6 - 20 mg/dL 13 17 13   Creatinine 0.61 - 1.24 mg/dL 1.11 1.12 1.29(H)  Sodium 135 - 145 mmol/L 137 136 137  Potassium 3.5 - 5.1 mmol/L 4.6 4.6 4.6  Chloride 101 - 111 mmol/L 110 110 110  CO2 22 - 32 mmol/L 18(L) 19(L) 21(L)  Calcium 8.9 - 10.3 mg/dL 9.2 9.2 9.0    CBC: CBC Latest Ref Rng & Units 07/27/2016 07/22/2016 07/09/2016  WBC 4.0 - 10.5 K/uL 4.6  12.2(H) 4.2  Hemoglobin 13.0 - 17.0 g/dL 12.3(L) 11.9(L) 9.8(L)  Hematocrit 39.0 - 52.0 % 36.7(L) 36.5(L) 29.2(L)  Platelets 150 -  400 K/uL 244 191 247    CBG: No results for input(s): GLUCAP in the last 168 hours.  Brief HPI:   Nathan Hoefer Tatumis a 75 y.o.RH-male with history of alcoholic cirrhosis with ascites/thrombocytopenia, Hep C, hepatocellular CA, CHF, A fib (no anticoagulation due to non-compliance) who was admitted on 06/25/16 after being found on the floor with left sided weakness, right gaze preference and facial droop with dysarthria. 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 He underwent cerebral angio with thrombectomy of R-MCA clot and IA Integrilin. 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 He tolerated extubation on 09/23 but developed fevers with difficulty handling secretion and was started on IV Vanc/Zosyn due to concerns of aspiration.  Infarct felt to be embolic due to unknown source and patient placed on ASA for secondary stroke prevention.  Dr. Erlinda Hong recommends anticoagulation and patient to follow up with cardiology after discharge for input. Patient with resultant left sided weakness, dysarthria and cognitive deficits affecting mobility and self care.  CIR recommended was recommended for follow up therapy.   Hospital Course: Nathan Frank was admitted to rehab 07/04/2016 for inpatient therapies to consist of PT, ST and OT at least three hours five days a week. Past admission physiatrist, therapy team and rehab RN have worked together to provide customized collaborative inpatient rehab. He was noted to have AKI at admission therefore IVF were continued initially. Drop in H/H has been monitored and this has improved from 9.5 to 12.3. No signs of bleeding or GI distress reported. He did develop peripheral edema with mild exacerbation of CHF. Lasix was resumed with resolution of overload. Serial monitoring of lytes shows that AKI has resolved and hypokalemia due to addition of diuretics resolved with supplement. As activity and  intake improved, he was noted to develop hypotension therefore lasix was discontinued.   He continued to have asymptomatic tachycardia with HR up to 140s with activity. Cardiology was consulted for input and metoprolol was increased to tid.  He continues to have resting HR in 120s in part due to deconditioning.  His diet has been advanced to regular textures and intake has been good. Follow up CBC revealed that Leucocytosis and thrombocytopenia has resolved. He has made steady progress and is currently at supervision to min assist level. He continues to be limited by visual perceptive deficits with decreased awareness of deficits as well as difficulty with problems solving. He will continue to receive follow up [place], [place], [place] and HHST by [place] after discharge.    Rehab course: During patient's stay in rehab weekly team conferences were held to monitor patient's progress, set goals and discuss barriers to discharge. At admission, patient required max assist with basic self care tasks and mobility. He demonstrated severe cognitive impairments with delayed processing, impaired initiation and problem solving affecting ability to carry out basic tasks. He was kept on dysphagia 3 diet with supervision for compensatory strategies. He  has had improvement in activity tolerance, balance, postural control, as well as ability to compensate for deficits.  He is able to complete bathing tasks with supervision and requires min assist for dressing. He does require moderate assist with LB dressing if not wearing sweat pants.  He requires supervision with increased time for transfers and to ambulate  95' with  use of RW.  He requires min assist for recall and problem solving.  Family education was completed with wife regarding all aspects of care and mobility.     Disposition: 01-Home or Self Care  Diet: Cardiac diet.   Special Instructions: 1. No alcohol.  2. Needs supervision and assistance with  activity. NO driving.    Discharge Instructions    AMB Referral to [facility] Management    Complete by:  As directed    Reason for consult:  Post hospital monitoring, assess for resources, support   Diagnoses of:  Stroke: Ischemic/TIA   Expected date of contact:  1-3 days (reserved for hospital discharges)   Please assign to community nurse for transition of care calls and assess for home visits.Wife is the primary contact.  Please assess for other [facility] disciple needs.   Questions please call:   Natividad Brood, RN BSN [place] [facility] Liaison  [phone] business mobile phone Toll free office [phone]       Medication List    STOP taking these medications   furosemide 40 MG tablet Commonly known as:  LASIX   potassium chloride 10 MEQ tablet Commonly known as:  K-DUR   saccharomyces boulardii 250 MG capsule Commonly known as:  FLORASTOR   sucralfate 1 g tablet Commonly known as:  CARAFATE   zolpidem 5 MG tablet Commonly known as:  AMBIEN     TAKE these medications   amitriptyline 10 MG tablet Commonly known as:  ELAVIL Take 1 tablet (10 mg total) by mouth at bedtime.   aspirin EC 325 MG tablet Take 1 tablet (325 mg total) by mouth daily.   folic acid 1 MG tablet Commonly known as:  FOLVITE Take 1 tablet (1 mg total) by mouth daily.   levothyroxine 50 MCG tablet Commonly known as:  SYNTHROID, LEVOTHROID Take 1 tablet (50 mcg total) by mouth daily before breakfast.   lipase/protease/amylase 12000 units Cpep capsule Commonly known as:  CREON Take 1 capsule (12,000 Units total) by mouth 3 (three) times daily with meals.   magnesium oxide 400 (241.3 Mg) MG tablet Commonly known as:  MAG-OX Take 1 tablet (400 mg total) by mouth daily.   metoprolol 50 MG tablet Commonly known as:  LOPRESSOR Take 1 tablet (50 mg total) by mouth 3 (three) times daily. What changed:  medication strength  how much to take   multivitamin with minerals Tabs  tablet Take 1 tablet by mouth daily.   spironolactone 25 MG tablet Commonly known as:  ALDACTONE Take 0.5 tablets (12.5 mg total) by mouth daily.   thiamine 100 MG tablet Take 1 tablet (100 mg total) by mouth daily.   VITAMIN D PO Take 1 tablet by mouth daily.      Follow-up Information    Charlett Blake, MD .   Specialty:  Physical Medicine and Rehabilitation Why:  office will call you with follow up appointment Contact information: [place] [address] [phone]        Xu,Jindong, MD. Call today.   Specialty:  Neurology Why:  for follow up appointment in 2-3 weeks Contact information: [address] [place] [address] [phone]        [name], MD. Call today.   Specialty:  Cardiology Why:  For follow up appointment/Discuss pros/cons of anticoagulation.  Contact information: [place] [address] [phone]        Thressa Sheller, MD. Call in 1  day(s).   Specialty:  Internal Medicine Why:  for follow up appointment in 1 week.  Contact information: [address] [place] [address] [phone]           Signed: Bary Leriche 07/30/2016, 5:33 PM

## 2016-07-30 NOTE — Progress Notes (Signed)
Patient and spouse received discharge instructions from Pam Love, PA-C with verbal understanding. Patient discharged to home with spouse and belongings. 

## 2016-07-30 NOTE — Patient Care Conference (Signed)
Inpatient RehabilitationTeam Conference and Plan of Care Update Date: 07/29/2016   Time: 11:00 AM    Patient Name: Nathan Frank      Medical Record Number: [id number]  Date of Birth: 1940-12-20 Sex: Male         Room/Bed: 4W03C/4W03C-01 Payor Info: Payor: [employer] MEDICARE / Plan: [employer]HC MEDICARE / Product Type: *No Product type* /    Admitting Diagnosis: R CVA  Admit Date/Time:  07/04/2016  5:58 PM Admission Comments: No comment available   Primary Diagnosis:  Cerebral infarction due to embolism of right middle cerebral artery (HCC) Principal Problem: Cerebral infarction due to embolism of right middle cerebral artery [facility])  Patient Active Problem List   Diagnosis Date Noted  . Acute blood loss anemia   . Lymphocytosis   . Atrial tachycardia ([place])   . Left hemiparesis ([place]) 07/04/2016  . Acute ischemic right MCA stroke ([place]) 07/04/2016  . Obesity 07/01/2016  . Family hx-stroke 07/01/2016  . Hyperglycemia 07/01/2016  . Wound of left leg 07/01/2016  . Dysphagia, post-stroke   . Dysarthria, post-stroke   . Pyrexia   . Bleeding gastrointestinal   . Polysubstance abuse   . Acute on chronic combined systolic and diastolic heart failure ([place])   . Mitral valve regurgitation   . Tachypnea   . Anemia of chronic disease   . Leukocytosis   . Cerebral infarction due to embolism of right middle cerebral artery ([place]) - s/p mechanical thrombectomy 06/25/2016  . Hypomagnesemia 06/09/2016  . Acute on chronic diastolic CHF (congestive heart failure) ([place]) 06/09/2016  . Alcoholic cirrhosis of liver with ascites ([place])   . Other specified hypothyroidism   . Hepatocellular carcinoma ([place])   . Alcohol withdrawal ([place]) 05/27/2016  . Generalized weakness 05/27/2016  . Hypokalemia 05/27/2016  . Dehydration 05/27/2016  . Diarrhea 04/28/2016  . SIRS (systemic inflammatory response syndrome) ([place]) 04/19/2016  . Alcohol use disorder, moderate, in controlled environment, dependence ([place])    . Lactic acidosis   . Tachycardia   . Atrial fibrillation with RVR ([place]) 04/18/2016  . Chest pain 04/18/2016  . Hypotension 04/18/2016  . Elevated lactic acid level 04/18/2016  . Chronic atrial fibrillation ([place]) 04/18/2016  . SVT (supraventricular tachycardia) ([place])   . Prolonged Q-T interval on ECG   . Neuropathy ([place])   . Essential hypertension   . Thrombocytopenia ([place]) 04/16/2015  . Abnormal TSH 04/16/2015  . NSTEMI (non-ST elevated myocardial infarction) ([place])   . Acute gastric ulcer   . Acute gastritis with hemorrhage   . GI bleed due to NSAIDs 12/13/2014  . Right knee pain 12/13/2014  . Dizziness and giddiness 12/13/2014  . Upper GI bleeding 12/13/2014  . Heroin overdose 02/20/2014  . S/P alcohol detoxification 02/02/2014  . Alcohol dependence ([place]) 02/02/2014  . Depressive disorder 02/01/2014  . Symptomatic cholelithiasis 12/15/2013  . Abdominal pain 12/14/2013  . Gastritis 06/07/2012  . DVT of lower limb, acute ([place]) 06/06/2012  . Oral thrush 06/05/2012  . Alcohol abuse 06/04/2012  . Weight loss 06/04/2012  . 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 Liver cirrhosis ([place]) 06/04/2012  . Hematuria 06/04/2012    Expected Discharge Date: Expected Discharge Date: 07/30/16  Team Members Present: Physician leading conference: Dr. Alysia Penna Nurse Present: Other (comment) Verdis Frederickson, RN) PT Present: Dwyane Dee, PT OT Present: Simonne Come, OT SLP Present: Windell Moulding, SLP PPS Coordinator present : Daiva Nakayama, RN, CRRN     Current Status/Progress Goal Weekly Team Focus  Medical   Sleeping better, tachycardia  optimize HR, BP  medication mangement   Bowel/Bladder   Cont. B&B, condom cath on at [facility], LBM 10/23  Remain cont.   [place] B&B function   Swallow/Nutrition/ Hydration             ADL's   min assist bathing and UB dressing, min-max assist LB dressing depending on clothing, min guard - supervision transfers  with RW  supervision overall   initiation, orientation, sequencing, Lt attention, LUE NMR, visuospatial, transfers, pt/family education   Mobility   supervision overall   supervision household level ambulation with RW  grad day 10/25   Communication             Safety/Cognition/ Behavioral Observations  min assist-supervision for basic, personally meaningful tasks   supervision   grad day and discharge   Pain   No c/o pain  <3  Assess ana treat pain q shift   Skin   L skin tear-healing  No new skin breakdown/infection  Assess skin q shift    Rehab Goals Patient on target to meet rehab goals: Yes Rehab Goals Revised: none *See Care Plan and progress notes for long and short-term goals.  Barriers to Discharge: still need physical assist    Possible Resolutions to Barriers:  cont family training    Discharge Planning/Teaching Needs:  Pt to return to his home with his wife to provide supervision.  Family education went well with pt's wife last week.   Team Discussion:  MD reported that cardiology has seen pt and they will follow him as an outpatient.  Pt is supervision for most PT tasks and has met his goals.  OT reported that pt is doing better with dressing sequencing and still needs some cues, but is mostly supervision except for shoes.  ST stated that pt is at goal level with safety and self care.  Revisions to Treatment Plan:  None - pt ready for d/c 07-30-16   Continued Need for Acute Rehabilitation Level of Care: The patient requires daily medical management by a physician with specialized training in physical medicine and rehabilitation for the following conditions: Daily direction of a multidisciplinary physical rehabilitation program to ensure safe treatment while eliciting the highest outcome that is of practical value to the patient.: Yes Daily medical management of patient stability for increased activity during participation in an intensive rehabilitation regime.:  Yes Daily analysis of laboratory values and/or radiology reports with any subsequent need for medication adjustment of medical intervention for : Neurological problems;Cardiac problems  Elfrieda Espino, Silvestre Mesi 07/30/2016, 12:22 PM

## 2016-07-30 NOTE — Progress Notes (Signed)
Social Work Discharge Note  The overall goal for the admission was met for:   Discharge location: Yes - home  Length of Stay: Yes - 26 days  Discharge activity level: Yes - supervision  Home/community participation: Yes  Services provided included: MD, RD, PT, OT, SLP, RN, Pharmacy, Neuropsych and SW  Financial Services: Medicaid and Private Insurance: Theme park manager  Follow-up services arranged: Home Health: PT/OT/ST/RN from [facility], DME: [phone]" wheelchair with basic cushion; rolling walker; 3-in-1 commode; tub transfer bench from [place] and Patient/Family has no preference for HH/DME agencies  Comments (or additional information): Pt to go home with his wife for 24/7 supervision.  Pt will also be followed by [place] for [employer].  Pt will have [facility] for therapy and RN.  Pt and wife both feel prepared for pt to come home.  Patient/Family verbalized understanding of follow-up arrangements: Yes  Individual responsible for coordination of the follow-up plan: pt and his wife  Confirmed correct DME delivered: Trey Sailors 07/30/2016    Titiana Severa, Silvestre Mesi

## 2016-07-30 NOTE — Progress Notes (Signed)
[place] PHYSICAL MEDICINE & REHABILITATION     PROGRESS NOTE    Subjective/Complaints: No issues overnight. Patient is looking forward to discharge. Wife will be coming in  ROS: Denies CP, SOB, N/V/D.  Objective: Vital Signs: Blood pressure 108/75, pulse 69, temperature 97.4 F (36.3 C), temperature source Oral, resp. rate 18, height 6' (1.829 m), weight 86.4 kg (190 lb 6.4 oz), SpO2 100 %. No results found.  Recent Labs  07/27/16 1119  WBC 4.6  HGB 12.3*  HCT 36.7*  PLT 244    Recent Labs  07/27/16 1119  NA 137  K 4.6  CL 110  GLUCOSE 104*  BUN 13  CREATININE 1.11  CALCIUM 9.2   CBG (last 3)  No results for input(s): GLUCAP in the last 72 hours.  Wt Readings from Last 3 Encounters:  07/30/16 86.4 kg (190 lb 6.4 oz)  06/30/16 103.8 kg (228 lb 14.4 oz)  06/16/16 92.6 kg (204 lb 3.2 oz)    Physical Exam:  Constitutional: He appears well-developed and well-nourished. NAD. HENT: Normocephalic and atraumatic.  Eyes: EOMI. No discharge.  Cardiovascular: Normal rate.  An irregular rhythm present. No murmur heard.Negative JVD Respiratory: Effort normal. No stridor. No respiratory distress.   GI: Soft. Bowel sounds normal There is no tenderness.  Neurological: He is alert and oriented.  Left facial weakness with minimal dysarthria  Fair insight and awareness.  He is able to follow simple one and two step motor commands. Motor: LUE: 4-/5 deltoid, bicep, tricep, wrist, hand LLE: 3+/5 HF, KE, 2- ADF/PF.  Skin: Skin is warm and dry. No rash noted.  BLE with dry flaky skin.  Psychiatric: His affect is blunt. He is slowed.  Extremities no pedal edema. No lower extremity edema Assessment/Plan: 1. Functional deficits, left hemiparesis  secondary to embolic right MCA infarct  Team conference today please see physician documentation under team conference tab, met with team face-to-face to discuss problems,progress, and goals. Formulized individual treatment plan based  on medical history, underlying problem and comorbidities. Function:  Bathing Bathing position Bathing activity did not occur: Refused Position: Wheelchair/chair at sink  Bathing parts Body parts bathed by patient: Right arm, Left arm, Chest, Abdomen, Right upper leg, Left upper leg, Front perineal area, Buttocks, Right lower leg, Left lower leg (per report) Body parts bathed by helper: Back  Bathing assist Assist Level: Supervision or verbal cues, Set up   Set up : To obtain items, To open containers  Upper Body Dressing/Undressing Upper body dressing   What is the patient wearing?: Pull over shirt/dress     Pull over shirt/dress - Perfomed by patient: Thread/unthread right sleeve, Thread/unthread left sleeve, Put head through opening, Pull shirt over trunk Pull over shirt/dress - Perfomed by helper: Thread/unthread right sleeve        Upper body assist Assist Level: More than reasonable time, Supervision or verbal cues      Lower Body Dressing/Undressing Lower body dressing   What is the patient wearing?: Pants, Shoes, [employer]  - Performed by patient: Pull underwear up/down Underwear - Performed by helper: Thread/unthread right underwear leg, Thread/unthread left underwear leg Pants- Performed by patient: Thread/unthread right pants leg, Thread/unthread left pants leg, Pull pants up/down Pants- Performed by helper: Thread/unthread right pants leg, Thread/unthread left pants leg Non-skid slipper socks- Performed by patient: Don/doff right sock, Don/doff left sock Non-skid slipper socks- Performed by helper: Don/doff right sock   Socks - Performed by helper: Don/doff right sock, Don/doff left sock Shoes -  Performed by patient: Don/doff right shoe, Don/doff left shoe Shoes - Performed by helper: Fasten right, Fasten left       TED Hose - Performed by helper: Don/doff right TED hose, Don/doff left TED hose  Lower body assist Assist for lower body dressing: Touching or  steadying assistance (Pt > 75%)      Toileting Toileting Toileting activity did not occur: No continent bowel/bladder event Toileting steps completed by patient: Adjust clothing prior to toileting, Performs perineal hygiene Toileting steps completed by helper: Adjust clothing after toileting Toileting Assistive Devices: Grab bar or rail  Toileting assist Assist level: Supervision or verbal cues   Transfers Chair/bed transfer   Chair/bed transfer method: Stand pivot Chair/bed transfer assist level: Supervision or verbal cues Chair/bed transfer assistive device: Environmental manager lift: Ecologist     Max distance: 82 Assist level: Supervision or verbal cues   Wheelchair Wheelchair activity did not occur: N/A (pt will not self propel w/c in home or community setting) Type: Manual Max wheelchair distance: 25' Assist Level: Moderate assistance (Pt 50 - 74%)  Cognition Comprehension Comprehension assist level: Follows basic conversation/direction with extra time/assistive device  Expression Expression assist level: Expresses basic needs/ideas: With extra time/assistive device  Social Interaction Social Interaction assist level: Interacts appropriately 75 - 89% of the time - Needs redirection for appropriate language or to initiate interaction.  Problem Solving Problem solving assist level: Solves basic 75 - 89% of the time/requires cueing 10 - 24% of the time  Memory Memory assist level: Recognizes or recalls 75 - 89% of the time/requires cueing 10 - 24% of the time   Medical Problem List and Plan: 1.  Left hemiparesis and funtctional deficits secondary to right MCA embolic infarct  Discharge home today with M.D. follow-up transitional care 2 weeks 2.  DVT Prophylaxis/Anticoagulation:   Lovenox 3. Pain Management: tylenol prn. Support limbs as needed to maintain joint position and prevent contracture 4. Mood: team to follow for ego support. SW to assess upon admit.   5. Neuropsych: This patient is not yet capable of making decisions on his own behalf. 6. Skin/Wound Care: encourage nutrition and pressure relief as needed 7. Fluids/Electrolytes/Nutrition:  variable  8. Afib: ASA daily             -metoprolol increased to 13m TID. Appreciate cardiology consult,monitor hypotension Vitals:   07/29/16 2019 07/30/16 0400  BP: 110/71 108/75  Pulse: 90 69  Resp:  18  Temp:  97.4 F (36.3 C)              -low BP but asymptomatic, will recheck for accuracy, D/C lasix, Normal 07/28/2016 BMET  9. hypokalemia  resolved with supplementation, has been on spironolactone and potassium and Lasix at home. 10/24 potassium normal.   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             -monitor LFT's and ammonia as needed                    -  no evidence of encephalopathy. At the current time 67. HTN--metoprolol, Aldactone 14.  CHF ef 40-45%    lower extremity edema resolved on diuretics. D/C Lasix10 mg once a day and cont spironolactone 12.5 mg per day, goal is for blood pressure to run higher to allow beta blocker to increase, Appreciate cardiology eval   15.Insomnia improved elavil low dose, no orthostatic hypotension  LOS (Days) 26 A FACE TO FACE EVALUATION WAS PERFORMED  KIRSTEINS,ANDREW E 07/30/2016 8:29 AM

## 2016-07-31 ENCOUNTER — Telehealth: Payer: Self-pay

## 2016-07-31 ENCOUNTER — Other Ambulatory Visit: Payer: Self-pay | Admitting: *Deleted

## 2016-07-31 NOTE — Patient Outreach (Signed)
[facility]) Care Management  07/31/2016  Nathan Frank 08-01-1941 [id number]   Referral received from hospital liaison to initiate transition of care program once discharged.  Member was admitted to hospital 9/21-9/30 for cerebral infarct & atrial fibrillation.  He was discharged to [facility] on 9/30, recently discharged on 10/26.  According to chart, he also has history of hypertension, heart failure, liver cirrhosis, hepatocellular carcinoma, DVT, and NSTEMI.  Call placed to preferred number, [phone], no answer.  HIPAA compliant voice message left.  Will await call back, if no call back will follow up next week.  [name], [place], MSN [place] [phone]

## 2016-07-31 NOTE — Telephone Encounter (Signed)
Transitional Care call- Nathan Frank    1. Are you/is patient experiencing any problems since coming home?No Are there any questions regarding any aspect of care? No 2. Are there any questions regarding medications administration/dosing? No Are meds being taken as prescribed? Yes 3. Have there been any falls? No 4. Has Home Health been to the house and/or have they contacted you? No If not, have you tried to contact them? Yes 5. Are bowels and bladder emptying properly? Yes Are there any unexpected incontinence issues?  No 6. Any fevers, problems with breathing, unexpected pain? No 7. Are there any skin problems or new areas of breakdown? No 8. Has the patient/family member arranged specialty MD follow up (ie cardiology/neurology/renal/surgical/etc)? Yes  9. Does the patient need any other services or support that we can help arrange? No 10. Are caregivers following through as expected in assisting the patient? Yes 11. Has the patient quit smoking, drinking alcohol, or using drugs as recommended? Patient stated he doesn't use alcohol, tobacco products or illegal drugs.    Appointment time and arrive time confirmed.

## 2016-08-01 DIAGNOSIS — I350 Nonrheumatic aortic (valve) stenosis: Secondary | ICD-10-CM | POA: Diagnosis not present

## 2016-08-01 DIAGNOSIS — I69354 Hemiplegia and hemiparesis following cerebral infarction affecting left non-dominant side: Secondary | ICD-10-CM | POA: Diagnosis not present

## 2016-08-01 DIAGNOSIS — I2510 Atherosclerotic heart disease of native coronary artery without angina pectoris: Secondary | ICD-10-CM | POA: Diagnosis not present

## 2016-08-01 DIAGNOSIS — K7460 Unspecified cirrhosis of liver: Secondary | ICD-10-CM | POA: Diagnosis not present

## 2016-08-01 DIAGNOSIS — I110 Hypertensive heart disease with heart failure: Secondary | ICD-10-CM | POA: Diagnosis not present

## 2016-08-01 DIAGNOSIS — I69392 Facial weakness following cerebral infarction: Secondary | ICD-10-CM | POA: Diagnosis not present

## 2016-08-01 DIAGNOSIS — K219 Gastro-esophageal reflux disease without esophagitis: Secondary | ICD-10-CM | POA: Diagnosis not present

## 2016-08-01 DIAGNOSIS — D638 Anemia in other chronic diseases classified elsewhere: Secondary | ICD-10-CM | POA: Diagnosis not present

## 2016-08-01 DIAGNOSIS — I5033 Acute on chronic diastolic (congestive) heart failure: Secondary | ICD-10-CM | POA: Diagnosis not present

## 2016-08-01 DIAGNOSIS — Z7982 Long term (current) use of aspirin: Secondary | ICD-10-CM | POA: Diagnosis not present

## 2016-08-01 DIAGNOSIS — I4891 Unspecified atrial fibrillation: Secondary | ICD-10-CM | POA: Diagnosis not present

## 2016-08-02 DIAGNOSIS — K219 Gastro-esophageal reflux disease without esophagitis: Secondary | ICD-10-CM | POA: Diagnosis not present

## 2016-08-02 DIAGNOSIS — I5033 Acute on chronic diastolic (congestive) heart failure: Secondary | ICD-10-CM | POA: Diagnosis not present

## 2016-08-02 DIAGNOSIS — Z7982 Long term (current) use of aspirin: Secondary | ICD-10-CM | POA: Diagnosis not present

## 2016-08-02 DIAGNOSIS — I2510 Atherosclerotic heart disease of native coronary artery without angina pectoris: Secondary | ICD-10-CM | POA: Diagnosis not present

## 2016-08-02 DIAGNOSIS — I69392 Facial weakness following cerebral infarction: Secondary | ICD-10-CM | POA: Diagnosis not present

## 2016-08-02 DIAGNOSIS — I350 Nonrheumatic aortic (valve) stenosis: Secondary | ICD-10-CM | POA: Diagnosis not present

## 2016-08-02 DIAGNOSIS — I69354 Hemiplegia and hemiparesis following cerebral infarction affecting left non-dominant side: Secondary | ICD-10-CM | POA: Diagnosis not present

## 2016-08-02 DIAGNOSIS — K7460 Unspecified cirrhosis of liver: Secondary | ICD-10-CM | POA: Diagnosis not present

## 2016-08-02 DIAGNOSIS — I4891 Unspecified atrial fibrillation: Secondary | ICD-10-CM | POA: Diagnosis not present

## 2016-08-02 DIAGNOSIS — D638 Anemia in other chronic diseases classified elsewhere: Secondary | ICD-10-CM | POA: Diagnosis not present

## 2016-08-02 DIAGNOSIS — I110 Hypertensive heart disease with heart failure: Secondary | ICD-10-CM | POA: Diagnosis not present

## 2016-08-03 ENCOUNTER — Other Ambulatory Visit: Payer: Self-pay | Admitting: *Deleted

## 2016-08-03 NOTE — Patient Outreach (Signed)
[facility]) Care Management  08/03/2016  Nathan Frank [birth year] [id number]   Call placed to initiate transition of care program.  Member state that this is not a good time to talk, request call back.  Will follow up within the next 2 days.  [name], [place], MSN [place] [phone]

## 2016-08-04 ENCOUNTER — Telehealth (HOSPITAL_COMMUNITY): Payer: Self-pay

## 2016-08-04 ENCOUNTER — Encounter: Payer: Medicare Other | Attending: Physical Medicine & Rehabilitation

## 2016-08-04 ENCOUNTER — Other Ambulatory Visit (HOSPITAL_COMMUNITY): Payer: Self-pay | Admitting: Interventional Radiology

## 2016-08-04 ENCOUNTER — Encounter: Payer: Self-pay | Admitting: Physical Medicine & Rehabilitation

## 2016-08-04 DIAGNOSIS — I2510 Atherosclerotic heart disease of native coronary artery without angina pectoris: Secondary | ICD-10-CM | POA: Diagnosis not present

## 2016-08-04 DIAGNOSIS — I4891 Unspecified atrial fibrillation: Secondary | ICD-10-CM | POA: Diagnosis not present

## 2016-08-04 DIAGNOSIS — I110 Hypertensive heart disease with heart failure: Secondary | ICD-10-CM | POA: Diagnosis not present

## 2016-08-04 DIAGNOSIS — K219 Gastro-esophageal reflux disease without esophagitis: Secondary | ICD-10-CM | POA: Diagnosis not present

## 2016-08-04 DIAGNOSIS — D638 Anemia in other chronic diseases classified elsewhere: Secondary | ICD-10-CM | POA: Diagnosis not present

## 2016-08-04 DIAGNOSIS — I350 Nonrheumatic aortic (valve) stenosis: Secondary | ICD-10-CM | POA: Diagnosis not present

## 2016-08-04 DIAGNOSIS — I5033 Acute on chronic diastolic (congestive) heart failure: Secondary | ICD-10-CM | POA: Diagnosis not present

## 2016-08-04 DIAGNOSIS — K7460 Unspecified cirrhosis of liver: Secondary | ICD-10-CM | POA: Diagnosis not present

## 2016-08-04 DIAGNOSIS — I69392 Facial weakness following cerebral infarction: Secondary | ICD-10-CM | POA: Diagnosis not present

## 2016-08-04 DIAGNOSIS — I639 Cerebral infarction, unspecified: Secondary | ICD-10-CM

## 2016-08-04 DIAGNOSIS — I69354 Hemiplegia and hemiparesis following cerebral infarction affecting left non-dominant side: Secondary | ICD-10-CM | POA: Diagnosis not present

## 2016-08-04 DIAGNOSIS — Z7982 Long term (current) use of aspirin: Secondary | ICD-10-CM | POA: Diagnosis not present

## 2016-08-04 NOTE — Telephone Encounter (Signed)
Left message for pt to call back. AW 

## 2016-08-04 NOTE — Telephone Encounter (Signed)
Patient was discharged from [facility] 10/26.  He has been scheduled with Sharrell Ku, PA-C on 08/14/16.

## 2016-08-05 ENCOUNTER — Ambulatory Visit: Payer: Self-pay | Admitting: *Deleted

## 2016-08-05 DIAGNOSIS — I110 Hypertensive heart disease with heart failure: Secondary | ICD-10-CM | POA: Diagnosis not present

## 2016-08-05 DIAGNOSIS — I350 Nonrheumatic aortic (valve) stenosis: Secondary | ICD-10-CM | POA: Diagnosis not present

## 2016-08-05 DIAGNOSIS — I4891 Unspecified atrial fibrillation: Secondary | ICD-10-CM | POA: Diagnosis not present

## 2016-08-05 DIAGNOSIS — D638 Anemia in other chronic diseases classified elsewhere: Secondary | ICD-10-CM | POA: Diagnosis not present

## 2016-08-05 DIAGNOSIS — Z7982 Long term (current) use of aspirin: Secondary | ICD-10-CM | POA: Diagnosis not present

## 2016-08-05 DIAGNOSIS — I2510 Atherosclerotic heart disease of native coronary artery without angina pectoris: Secondary | ICD-10-CM | POA: Diagnosis not present

## 2016-08-05 DIAGNOSIS — I69392 Facial weakness following cerebral infarction: Secondary | ICD-10-CM | POA: Diagnosis not present

## 2016-08-05 DIAGNOSIS — I69354 Hemiplegia and hemiparesis following cerebral infarction affecting left non-dominant side: Secondary | ICD-10-CM | POA: Diagnosis not present

## 2016-08-05 DIAGNOSIS — K219 Gastro-esophageal reflux disease without esophagitis: Secondary | ICD-10-CM | POA: Diagnosis not present

## 2016-08-05 DIAGNOSIS — K7460 Unspecified cirrhosis of liver: Secondary | ICD-10-CM | POA: Diagnosis not present

## 2016-08-05 DIAGNOSIS — I5033 Acute on chronic diastolic (congestive) heart failure: Secondary | ICD-10-CM | POA: Diagnosis not present

## 2016-08-06 ENCOUNTER — Other Ambulatory Visit: Payer: Self-pay | Admitting: *Deleted

## 2016-08-06 ENCOUNTER — Encounter (HOSPITAL_COMMUNITY): Payer: Self-pay | Admitting: Emergency Medicine

## 2016-08-06 ENCOUNTER — Inpatient Hospital Stay (HOSPITAL_COMMUNITY)
Admission: EM | Admit: 2016-08-06 | Discharge: 2016-08-09 | DRG: 309 | Disposition: A | Discharge status: Home Health | Payer: Medicare Other | Attending: Family Medicine | Admitting: Family Medicine

## 2016-08-06 ENCOUNTER — Encounter: Payer: Self-pay | Admitting: *Deleted

## 2016-08-06 ENCOUNTER — Emergency Department (HOSPITAL_COMMUNITY): Payer: Medicare Other

## 2016-08-06 DIAGNOSIS — E669 Obesity, unspecified: Secondary | ICD-10-CM | POA: Diagnosis present

## 2016-08-06 DIAGNOSIS — R319 Hematuria, unspecified: Secondary | ICD-10-CM

## 2016-08-06 DIAGNOSIS — F10288 Alcohol dependence with other alcohol-induced disorder: Secondary | ICD-10-CM | POA: Diagnosis not present

## 2016-08-06 DIAGNOSIS — Z823 Family history of stroke: Secondary | ICD-10-CM

## 2016-08-06 DIAGNOSIS — N390 Urinary tract infection, site not specified: Secondary | ICD-10-CM | POA: Diagnosis present

## 2016-08-06 DIAGNOSIS — I952 Hypotension due to drugs: Secondary | ICD-10-CM | POA: Diagnosis not present

## 2016-08-06 DIAGNOSIS — B961 Klebsiella pneumoniae [K. pneumoniae] as the cause of diseases classified elsewhere: Secondary | ICD-10-CM | POA: Diagnosis not present

## 2016-08-06 DIAGNOSIS — K219 Gastro-esophageal reflux disease without esophagitis: Secondary | ICD-10-CM | POA: Diagnosis not present

## 2016-08-06 DIAGNOSIS — Z87891 Personal history of nicotine dependence: Secondary | ICD-10-CM

## 2016-08-06 DIAGNOSIS — I4892 Unspecified atrial flutter: Secondary | ICD-10-CM | POA: Diagnosis not present

## 2016-08-06 DIAGNOSIS — Z6829 Body mass index (BMI) 29.0-29.9, adult: Secondary | ICD-10-CM

## 2016-08-06 DIAGNOSIS — F1020 Alcohol dependence, uncomplicated: Secondary | ICD-10-CM | POA: Diagnosis present

## 2016-08-06 DIAGNOSIS — I69354 Hemiplegia and hemiparesis following cerebral infarction affecting left non-dominant side: Secondary | ICD-10-CM | POA: Diagnosis not present

## 2016-08-06 DIAGNOSIS — D696 Thrombocytopenia, unspecified: Secondary | ICD-10-CM | POA: Diagnosis present

## 2016-08-06 DIAGNOSIS — G629 Polyneuropathy, unspecified: Secondary | ICD-10-CM | POA: Diagnosis not present

## 2016-08-06 DIAGNOSIS — R000 Tachycardia, unspecified: Secondary | ICD-10-CM | POA: Diagnosis not present

## 2016-08-06 DIAGNOSIS — K7031 Alcoholic cirrhosis of liver with ascites: Secondary | ICD-10-CM | POA: Diagnosis present

## 2016-08-06 DIAGNOSIS — I4891 Unspecified atrial fibrillation: Principal | ICD-10-CM | POA: Diagnosis present

## 2016-08-06 DIAGNOSIS — B182 Chronic viral hepatitis C: Secondary | ICD-10-CM | POA: Diagnosis present

## 2016-08-06 DIAGNOSIS — K7030 Alcoholic cirrhosis of liver without ascites: Secondary | ICD-10-CM | POA: Diagnosis present

## 2016-08-06 DIAGNOSIS — E038 Other specified hypothyroidism: Secondary | ICD-10-CM | POA: Diagnosis present

## 2016-08-06 DIAGNOSIS — D638 Anemia in other chronic diseases classified elsewhere: Secondary | ICD-10-CM | POA: Diagnosis present

## 2016-08-06 DIAGNOSIS — I10 Essential (primary) hypertension: Secondary | ICD-10-CM | POA: Diagnosis present

## 2016-08-06 DIAGNOSIS — E876 Hypokalemia: Secondary | ICD-10-CM | POA: Diagnosis not present

## 2016-08-06 DIAGNOSIS — Z86718 Personal history of other venous thrombosis and embolism: Secondary | ICD-10-CM

## 2016-08-06 DIAGNOSIS — R7989 Other specified abnormal findings of blood chemistry: Secondary | ICD-10-CM | POA: Diagnosis not present

## 2016-08-06 DIAGNOSIS — I110 Hypertensive heart disease with heart failure: Secondary | ICD-10-CM | POA: Diagnosis present

## 2016-08-06 DIAGNOSIS — I5042 Chronic combined systolic (congestive) and diastolic (congestive) heart failure: Secondary | ICD-10-CM | POA: Diagnosis not present

## 2016-08-06 DIAGNOSIS — R197 Diarrhea, unspecified: Secondary | ICD-10-CM | POA: Diagnosis present

## 2016-08-06 DIAGNOSIS — F1910 Other psychoactive substance abuse, uncomplicated: Secondary | ICD-10-CM | POA: Diagnosis present

## 2016-08-06 DIAGNOSIS — I252 Old myocardial infarction: Secondary | ICD-10-CM

## 2016-08-06 DIAGNOSIS — Z8505 Personal history of malignant neoplasm of liver: Secondary | ICD-10-CM

## 2016-08-06 DIAGNOSIS — Z7982 Long term (current) use of aspirin: Secondary | ICD-10-CM

## 2016-08-06 DIAGNOSIS — C220 Liver cell carcinoma: Secondary | ICD-10-CM | POA: Diagnosis present

## 2016-08-06 DIAGNOSIS — I63411 Cerebral infarction due to embolism of right middle cerebral artery: Secondary | ICD-10-CM | POA: Diagnosis present

## 2016-08-06 DIAGNOSIS — T461X5A Adverse effect of calcium-channel blockers, initial encounter: Secondary | ICD-10-CM | POA: Diagnosis not present

## 2016-08-06 DIAGNOSIS — Z79899 Other long term (current) drug therapy: Secondary | ICD-10-CM

## 2016-08-06 LAB — COMPREHENSIVE METABOLIC PANEL
ALBUMIN: 2.4 g/dL — AB (ref 3.5–5.0)
ALT: 26 U/L (ref 17–63)
ANION GAP: 9 (ref 5–15)
AST: 57 U/L — ABNORMAL HIGH (ref 15–41)
Alkaline Phosphatase: 54 U/L (ref 38–126)
BILIRUBIN TOTAL: 1 mg/dL (ref 0.3–1.2)
BUN: 12 mg/dL (ref 6–20)
CHLORIDE: 114 mmol/L — AB (ref 101–111)
CO2: 18 mmol/L — ABNORMAL LOW (ref 22–32)
Calcium: 8.3 mg/dL — ABNORMAL LOW (ref 8.9–10.3)
Creatinine, Ser: 0.94 mg/dL (ref 0.61–1.24)
GFR calc Af Amer: >60 mL/min (ref >60)
Glucose, Bld: 85 mg/dL (ref 65–99)
POTASSIUM: 3.7 mmol/L (ref 3.5–5.1)
Sodium: 141 mmol/L (ref 135–145)
TOTAL PROTEIN: 6 g/dL — AB (ref 6.5–8.1)

## 2016-08-06 LAB — CBC
HCT: 34.3 % — ABNORMAL LOW (ref 39.0–52.0)
HCT: 37.7 % — ABNORMAL LOW (ref 39.0–52.0)
HEMATOCRIT: 34.2 % — AB (ref 39.0–52.0)
HEMOGLOBIN: 12.5 g/dL — AB (ref 13.0–17.0)
Hemoglobin: 11.4 g/dL — ABNORMAL LOW (ref 13.0–17.0)
Hemoglobin: 11.4 g/dL — ABNORMAL LOW (ref 13.0–17.0)
MCH: 29.3 pg (ref 26.0–34.0)
MCH: 29.4 pg (ref 26.0–34.0)
MCH: 29.3 pg (ref 26.0–34.0)
MCHC: 33.2 g/dL (ref 30.0–36.0)
MCHC: 33.2 g/dL (ref 30.0–36.0)
MCHC: 33.3 g/dL (ref 30.0–36.0)
MCV: 88.2 fL (ref 78.0–100.0)
MCV: 88.7 fL (ref 78.0–100.0)
MCV: 87.9 fL (ref 78.0–100.0)
PLATELETS: 27 10*3/uL — AB (ref 150–400)
PLATELETS: 203 10*3/uL (ref 150–400)
PLATELETS: 178 10*3/uL (ref 150–400)
RBC: 3.89 MIL/uL — AB (ref 4.22–5.81)
RBC: 4.25 MIL/uL (ref 4.22–5.81)
RBC: 3.89 MIL/uL — AB (ref 4.22–5.81)
RDW: 15.9 % — ABNORMAL HIGH (ref 11.5–15.5)
RDW: 15.8 % — ABNORMAL HIGH (ref 11.5–15.5)
RDW: 15.9 % — ABNORMAL HIGH (ref 11.5–15.5)
WBC: 3.8 10*3/uL — ABNORMAL LOW (ref 4.0–10.5)
WBC: 4.7 10*3/uL (ref 4.0–10.5)
WBC: 3.8 10*3/uL — ABNORMAL LOW (ref 4.0–10.5)

## 2016-08-06 LAB — RAPID URINE DRUG SCREEN, HOSP PERFORMED
Amphetamines: NOT DETECTED
Barbiturates: NOT DETECTED
Benzodiazepines: POSITIVE — AB
COCAINE: NOT DETECTED
OPIATES: NOT DETECTED
Tetrahydrocannabinol: NOT DETECTED

## 2016-08-06 LAB — URINALYSIS, ROUTINE W REFLEX MICROSCOPIC
BILIRUBIN URINE: NEGATIVE
Glucose, UA: NEGATIVE mg/dL
KETONES UR: NEGATIVE mg/dL
NITRITE: NEGATIVE
PROTEIN: 100 mg/dL — AB
Specific Gravity, Urine: 1.015 (ref 1.005–1.030)
pH: 6 (ref 5.0–8.0)

## 2016-08-06 LAB — I-STAT CG4 LACTIC ACID, ED: LACTIC ACID, VENOUS: 3.37 mmol/L — AB (ref 0.5–1.9)

## 2016-08-06 LAB — ETHANOL: Alcohol, Ethyl (B): <5 mg/dL (ref <5)

## 2016-08-06 LAB — TSH: TSH: 3.005 u[IU]/mL (ref 0.350–4.500)

## 2016-08-06 LAB — PROTIME-INR
INR: 1.24
Prothrombin Time: 15.7 seconds — ABNORMAL HIGH (ref 11.4–15.2)

## 2016-08-06 LAB — URINE MICROSCOPIC-ADD ON

## 2016-08-06 LAB — POTASSIUM: POTASSIUM: 3.5 mmol/L (ref 3.5–5.1)

## 2016-08-06 LAB — MAGNESIUM: MAGNESIUM: 1.6 mg/dL — AB (ref 1.7–2.4)

## 2016-08-06 LAB — I-STAT TROPONIN, ED: TROPONIN I, POC: 0.01 ng/mL (ref 0.00–0.08)

## 2016-08-06 LAB — LACTIC ACID, PLASMA
Lactic Acid, Venous: 2.5 mmol/L (ref 0.5–1.9)
Lactic Acid, Venous: 2.7 mmol/L (ref 0.5–1.9)

## 2016-08-06 MED ORDER — SODIUM BICARBONATE 8.4 % IV SOLN
INTRAVENOUS | Status: AC
Start: 2016-08-06 — End: 2016-08-06
  Filled 2016-08-06: qty 50

## 2016-08-06 MED ORDER — DILTIAZEM HCL 100 MG IV SOLR
5.0000 mg/h | INTRAVENOUS | Status: DC
  Administered 2016-08-06: 5 mg/h via INTRAVENOUS
  Filled 2016-08-06: qty 100

## 2016-08-06 MED ORDER — DILTIAZEM HCL 100 MG IV SOLR: 5.0000 mg/h | INTRAVENOUS | Status: DC

## 2016-08-06 MED ORDER — MAGNESIUM OXIDE 400 (241.3 MG) MG PO TABS
400.0000 mg | ORAL_TABLET | Freq: Every day | ORAL | Status: DC
  Administered 2016-08-07 – 2016-08-09 (×3): 400 mg via ORAL
  Filled 2016-08-06 (×4): qty 1

## 2016-08-06 MED ORDER — METOPROLOL TARTRATE 50 MG PO TABS
50.0000 mg | ORAL_TABLET | Freq: Three times a day (TID) | ORAL | Status: DC
  Administered 2016-08-07: 50 mg via ORAL
  Filled 2016-08-06 (×4): qty 1

## 2016-08-06 MED ORDER — SODIUM CHLORIDE 0.9 % IV SOLN
INTRAVENOUS | Status: DC
  Administered 2016-08-06: 18:00:00 via INTRAVENOUS

## 2016-08-06 MED ORDER — ADULT MULTIVITAMIN W/MINERALS CH
1.0000 | ORAL_TABLET | Freq: Every day | ORAL | Status: DC
  Administered 2016-08-07 – 2016-08-09 (×3): 1 via ORAL
  Filled 2016-08-06 (×3): qty 1

## 2016-08-06 MED ORDER — ONDANSETRON HCL 4 MG PO TABS: 4.0000 mg | ORAL_TABLET | Freq: Four times a day (QID) | ORAL | Status: DC | PRN

## 2016-08-06 MED ORDER — LEVOTHYROXINE SODIUM 50 MCG PO TABS
50.0000 ug | ORAL_TABLET | Freq: Every day | ORAL | Status: DC
  Filled 2016-08-06: qty 1

## 2016-08-06 MED ORDER — SPIRONOLACTONE 25 MG PO TABS
12.5000 mg | ORAL_TABLET | Freq: Every day | ORAL | Status: DC
Start: 2016-08-06 — End: 2016-08-09
  Administered 2016-08-07 – 2016-08-09 (×3): 12.5 mg via ORAL
  Filled 2016-08-06 (×3): qty 1

## 2016-08-06 MED ORDER — ONDANSETRON HCL 4 MG/2ML IJ SOLN: 4.0000 mg | Freq: Four times a day (QID) | INTRAMUSCULAR | Status: DC | PRN

## 2016-08-06 MED ORDER — SODIUM BICARBONATE 8.4 % IV SOLN
100.0000 meq | Freq: Once | INTRAVENOUS | Status: AC
  Administered 2016-08-06: 100 meq via INTRAVENOUS
  Filled 2016-08-06: qty 50

## 2016-08-06 MED ORDER — MAGNESIUM SULFATE 2 GM/50ML IV SOLN
2.0000 g | Freq: Once | INTRAVENOUS | Status: AC
  Administered 2016-08-06: 2 g via INTRAVENOUS
  Filled 2016-08-06: qty 50

## 2016-08-06 MED ORDER — DILTIAZEM LOAD VIA INFUSION
10.0000 mg | Freq: Once | INTRAVENOUS | Status: AC
  Administered 2016-08-06: 10 mg via INTRAVENOUS
  Filled 2016-08-06: qty 10

## 2016-08-06 MED ORDER — AMITRIPTYLINE HCL 10 MG PO TABS
10.0000 mg | ORAL_TABLET | Freq: Every day | ORAL | Status: DC
  Administered 2016-08-06 – 2016-08-08 (×3): 10 mg via ORAL
  Filled 2016-08-06 (×4): qty 1

## 2016-08-06 MED ORDER — SODIUM CHLORIDE 0.9 % IV BOLUS (SEPSIS)
250.0000 mL | Freq: Once | INTRAVENOUS | Status: AC
  Administered 2016-08-06: 250 mL via INTRAVENOUS

## 2016-08-06 MED ORDER — ACETAMINOPHEN 650 MG RE SUPP: 650.0000 mg | Freq: Four times a day (QID) | RECTAL | Status: DC | PRN

## 2016-08-06 MED ORDER — FOLIC ACID 1 MG PO TABS
1.0000 mg | ORAL_TABLET | Freq: Every day | ORAL | Status: DC
  Administered 2016-08-07 – 2016-08-09 (×3): 1 mg via ORAL
  Filled 2016-08-06 (×3): qty 1

## 2016-08-06 MED ORDER — VITAMIN B-1 100 MG PO TABS: 100.0000 mg | ORAL_TABLET | Freq: Every day | ORAL | Status: DC

## 2016-08-06 MED ORDER — ASPIRIN EC 325 MG PO TBEC: 325.0000 mg | DELAYED_RELEASE_TABLET | Freq: Every day | ORAL | Status: DC

## 2016-08-06 MED ORDER — DEXTROSE 5 % IV SOLN
2.0000 g | INTRAVENOUS | Status: DC
  Administered 2016-08-06 – 2016-08-08 (×3): 2 g via INTRAVENOUS
  Filled 2016-08-06 (×4): qty 2

## 2016-08-06 MED ORDER — ACETAMINOPHEN 325 MG PO TABS
650.0000 mg | ORAL_TABLET | Freq: Four times a day (QID) | ORAL | Status: DC | PRN
  Administered 2016-08-06: 650 mg via ORAL
  Filled 2016-08-06: qty 2

## 2016-08-06 NOTE — ED Triage Notes (Signed)
Pt arrives via EMs from home with several days of loose stools. Pt noted by home PT therapist to have elevated HR before participating in PT. Ems arrival pt with HR 150s irregular, systolic bp in [id number]. 123XX123 LFA, 584ml NS PTA. Pt alert, oriented x4, denies pain.

## 2016-08-06 NOTE — Patient Outreach (Signed)
[facility]) Care Management  08/06/2016  Nathan Frank [birth year] [id number]   3rd attempt made to contact member to complete transition of care assessment.  No answer, HIPAA compliant voice message left.  Will await call back.  Will send outreach letter.  If no response in 10 days, will close case.  [name], [place], MSN [place] [phone]

## 2016-08-06 NOTE — Patient Outreach (Signed)
[facility]) Care Management  08/06/2016  Nathan Frank 1941-03-21 [id number]   Noted that member has been readmitted to hospital with atrial fibrillation.  Hospital liaison notified.  Will follow up after discharge.  [name], [place], MSN [place] [phone]

## 2016-08-06 NOTE — ED Notes (Signed)
Attempted report x1. 

## 2016-08-06 NOTE — ED Notes (Signed)
Dr Ralene Bathe notified of critical platelets.

## 2016-08-06 NOTE — H&P (Signed)
History and Physical    Nathan Frank [id number] DOB: Nov 18, 1940 DOA: 08/06/2016  PCP: Thressa Sheller, MD Patient coming from: home  Chief Complaint: Tachycardia  HPI: Nathan Frank is a very pleasant 75 y.o. male with medical history significant for A. 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 Initial evaluation reveals A. fib with RVR.  Formation is obtained from the patient and the chart. He states several weeks ago he was in the hospital due to a stroke. He went home using a walker and with mild left-sided weakness and home health PT. He was in his usual state of health when his physical therapist came today and she "noticed mild heart was beating fast". He denies any palpitation shortness of breath headache dizziness syncope or near-syncope. He denies chest pain lower strandy edema cough generalized weakness. He denies fever chills abdominal pain nausea vomiting. He does report one episode of diarrhea but reports he has daily bowel movements are usually "quite loose". He denies melena bright red blood per rectum. He reports compliance with his medications.    ED Course: In the emergency department is afebrile with a slightly soft blood pressure which is his baseline and a heart rate of 146. He is provided with diltiazem bolus and diltiazem drip initiated. At the time of admission heart rate is 95.  Review of Systems: As per HPI otherwise 10 point review of systems negative.   Ambulatory Status: Ambulates with a walker since his recent stroke. No recent falls fairly independent with ADLs  Past Medical History:  Diagnosis Date  . Abnormal TSH   . Acute gastric ulcer   . Acute gastritis with hemorrhage   . Alcohol abuse   . Alcohol dependence ([place])  02/02/2014  . Anemia   . CHF (congestive heart failure) ([place])   . Cirrhosis ([place]) 06/04/2012  . Depressive disorder 02/01/2014  . Dizziness and giddiness 12/13/2014  . DVT of lower limb, acute ([place]) 06/06/2012  . Essential hypertension   . Fatty liver   . Gallstones   . Gastritis 06/07/2012  . GERD (gastroesophageal reflux disease)   . GI bleed due to NSAIDs 12/13/2014  . Granulomatous gastritis   . Hematuria 06/04/2012  . Hepatitis C   . Hepatocellular carcinoma ([place])   . Heroin abuse    "I haven't done that since I don't know when."  . Heroin overdose 02/20/2014  . LV dysfunction    a. 04/2015: EF 45-50% by cath.  . Neuropathy ([place])   . NSTEMI (non-ST elevated myocardial infarction) ([place])    a. 04/2015 - patent coronaries. Etiology possibly due to coronary spasm versus embolus, stress cardiomyopathy (atypical), and aborted infarction related to plaque rupture with thrombosis and dissolution. Amlodipine started. Not on antiplatelets due to GIB/cirrhosis history.  . Oral thrush 06/05/2012  . Polysubstance abuse    Rare marijuana.  No EtOH x 2 months.   . Prolonged Q-T interval on ECG    a. 12/2014 - treated with magnesium.  . Right knee pain 12/13/2014  . S/P alcohol detoxification 02/02/2014  . SVT (supraventricular tachycardia) ([place])    a. 12/2014 in setting of GIB, ETOH, NSAIDS, gastritis.  . Symptomatic cholelithiasis 12/15/2013  . Thrombocytopenia ([place])   . Upper GI bleeding 12/13/2014  . Weight loss 06/04/2012    Past Surgical  History:  Procedure Laterality Date  . CARDIAC CATHETERIZATION N/A 04/15/2015   Procedure: Left Heart Cath and Coronary Angiography;  Surgeon: Belva Crome, MD;  Location: [facility];  Service: Cardiovascular;  Laterality: N/A;  . CIRCUMCISION    . ESOPHAGOGASTRODUODENOSCOPY  06/07/2012   Procedure: ESOPHAGOGASTRODUODENOSCOPY (EGD);  Surgeon: Milus Banister, MD;  Location: [facility];  Service: Endoscopy;  Laterality: N/A;  may need treatment of varices  .  ESOPHAGOGASTRODUODENOSCOPY N/A 12/14/2014   Procedure: ESOPHAGOGASTRODUODENOSCOPY (EGD);  Surgeon: Jerene Bears, MD;  Location[facility];  Service: Endoscopy;  Laterality: N/A;  . IR GENERIC HISTORICAL  06/25/2016   IR US GUIDE VASC ACCESS LEFT 06/25/2016 Corrie Mckusick, DO [facility]  . IR GENERIC HISTORICAL  06/25/2016   IR RADIOLOGY PERIPHERAL GUIDED IV START 06/25/2016 Corrie Mckusick, DO [facility]  . IR GENERIC HISTORICAL  06/25/2016   IR PERCUTANEOUS ART THROMBECTOMY/INFUSION INTRACRANIAL INC DIAG ANGIO 06/25/2016 Luanne Bras, MD [facility]  . RADIOLOGY WITH ANESTHESIA N/A 06/25/2016   Procedure: RADIOLOGY WITH ANESTHESIA;  Surgeon: Luanne Bras, MD;  Location: [facility];  Service: Radiology;  Laterality: N/A;  . TONSILLECTOMY      Social History   Social History  . Marital status: Married    Spouse name: N/A  . Number of children: 3  . Years of education: N/A   Occupational History  . Retired Education officer, [employer]    Social History Main Topics  . Smoking status: Former Smoker    Packs/day: 0.50    Years: 5.00    Types: Cigarettes  . Smokeless tobacco: Never Used     Comment: "quit smoking cigarettes in the 1970's"  . Alcohol use 16.8 oz/week    28 Glasses of wine per week     Comment: 06/2016 " I quit drinking a couple of weeks ago "  . Drug use: No  . Sexual activity: Not Currently    Birth control/ protection: Condom   Other Topics Concern  . Not on file   Social History Narrative   Lost one son to a gunshot.  Lives with wife.      No Known Allergies  Family History  Problem Relation Age of Onset  . Stroke Father   . Prostate cancer Brother   . Heart disease Mother     Pacemaker    Prior to Admission medications   Medication Sig Start Date End Date Taking? Authorizing Provider  amitriptyline (ELAVIL) 10 MG tablet Take 1 tablet (10 mg total) by mouth at bedtime. 07/30/16   Bary Leriche, PA-C  aspirin EC 325 MG tablet Take 1 tablet (325 mg total) by  mouth daily. 06/16/16   Florencia Reasons, MD  Cholecalciferol (VITAMIN D PO) Take 1 tablet by mouth daily.    Historical Provider, MD  folic acid (FOLVITE) 1 MG tablet Take 1 tablet (1 mg total) by mouth daily. 04/21/16   Maryann Mikhail, DO  levothyroxine (SYNTHROID, LEVOTHROID) 50 MCG tablet Take 1 tablet (50 mcg total) by mouth daily before breakfast. 06/16/16   Florencia Reasons, MD  magnesium oxide (MAG-OX) 400 (241.3 Mg) MG tablet Take 1 tablet (400 mg total) by mouth daily. 06/03/16   Verlee Monte, MD  metoprolol (LOPRESSOR) 50 MG tablet Take 1 tablet (50 mg total) by mouth 3 (three) times daily. 07/30/16   Bary Leriche, PA-C  Multiple Vitamin (MULTIVITAMIN WITH MINERALS) TABS tablet Take 1 tablet by mouth daily. 04/21/16   Maryann Mikhail, DO  spironolactone (ALDACTONE) 25 MG  tablet Take 0.5 tablets (12.5 mg total) by mouth daily. 07/30/16   Bary Leriche, PA-C  thiamine 100 MG tablet Take 1 tablet (100 mg total) by mouth daily. Patient not taking: Reported on 05/27/2016 04/21/16   Cristal Ford, DO    Physical Exam: Vitals:   08/06/16 1500 08/06/16 1600 08/06/16 1615 08/06/16 1703  BP: 97/74 101/72 102/83 109/72  Pulse: 77 84 92   Resp: 19 17 15 12   Temp:      TempSrc:      SpO2: 97% 98% 98% 100%  Weight:    83.9 kg (185 lb)  Height:    5\' 9"  (1.753 m)     General:  Appears calm and comfortable, no acute distress Eyes:  PERRL, EOMI, normal lids, iris ENT:  grossly normal hearing, lips & tongue, mucous membranes of her mouth moist and pink Neck:  no LAD, masses or thyromegaly Cardiovascular:  Irregularly irregular, no m/r/g. No LE edema. Pulses present and palpable Respiratory:  CTA bilaterally slightly distant, no w/r/r. Normal respiratory effort. Abdomen:  soft, ntnd, NABS Skin:  no rash or induration seen on limited exam Musculoskeletal:  grossly normal tone BUE/BLE, good ROM, no bony abnormality Psychiatric:  grossly normal mood and affect, speech fluent and appropriate, AOx3 Neurologic:  CN  2-12 grossly intact, moves all extremities in coordinated fashion, sensation intact left grip 4 out of 5 right grip 5 out of 5 left lower extremity strength 4 out of 5 right lower extremity strength 5 out of 5 speech clear facial symmetry  Labs on Admission: I have personally reviewed following labs and imaging studies  CBC:  Recent Labs Lab 08/06/16 1408  WBC 3.8*  HGB 11.4*  HCT 34.3*  MCV 88.2  PLT 27*   Basic Metabolic Panel:  Recent Labs Lab 08/06/16 1427 08/06/16 1535  NA 141  --   K 3.7 3.5  CL 114*  --   CO2 18*  --   GLUCOSE 85  --   BUN 12  --   CREATININE 0.94  --   CALCIUM 8.3*  --   MG  --  1.6*   GFR: Estimated Creatinine Clearance: 67.9 mL/min (by C-G formula based on SCr of 0.94 mg/dL). Liver Function Tests:  Recent Labs Lab 08/06/16 1427  AST 57*  ALT 26  ALKPHOS 54  BILITOT 1.0  PROT 6.0*  ALBUMIN 2.4*   No results for input(s): LIPASE, AMYLASE in the last 168 hours. No results for input(s): AMMONIA in the last 168 hours. Coagulation Profile: No results for input(s): INR, PROTIME in the last 168 hours. Cardiac Enzymes: No results for input(s): CKTOTAL, CKMB, CKMBINDEX, TROPONINI in the last 168 hours. BNP (last 3 results) No results for input(s): PROBNP in the last 8760 hours. HbA1C: No results for input(s): HGBA1C in the last 72 hours. CBG: No results for input(s): GLUCAP in the last 168 hours. Lipid Profile: No results for input(s): CHOL, HDL, LDLCALC, TRIG, CHOLHDL, LDLDIRECT in the last 72 hours. Thyroid Function Tests: No results for input(s): TSH, T4TOTAL, FREET4, T3FREE, THYROIDAB in the last 72 hours. Anemia Panel: No results for input(s): VITAMINB12, FOLATE, FERRITIN, TIBC, IRON, RETICCTPCT in the last 72 hours. Urine analysis:    Component Value Date/Time   COLORURINE YELLOW 08/06/2016 1610   APPEARANCEUR TURBID (A) 08/06/2016 1610   LABSPEC 1.015 08/06/2016 1610   PHURINE 6.0 08/06/2016 1610   GLUCOSEU NEGATIVE  08/06/2016 1610   HGBUR MODERATE (A) 08/06/2016 1610   BILIRUBINUR NEGATIVE 08/06/2016  [place] 08/06/2016 1610   PROTEINUR 100 (A) 08/06/2016 1610   UROBILINOGEN 2.0 (H) 12/13/2014 1256   NITRITE NEGATIVE 08/06/2016 1610   LEUKOCYTESUR LARGE (A) 08/06/2016 1610    Creatinine Clearance: Estimated Creatinine Clearance: 67.9 mL/min (by C-G formula based on SCr of 0.94 mg/dL).  Sepsis Labs: @LABRCNTIP (procalcitonin:4,lacticidven:4) )No results found for this or any previous visit (from the past 240 hour(s)).   Radiological Exams on Admission: Dg Chest Port 1 View  Result Date: 08/06/2016 CLINICAL DATA:  75 year old hypertensive male with tachycardia. Initial encounter. EXAM: PORTABLE CHEST 1 VIEW COMPARISON:  07/20/2016. FINDINGS: No infiltrate, congestive heart failure or pneumothorax. No plain film evidence of pulmonary malignancy. Heart size within normal limits. Calcified aorta. IMPRESSION: No active disease. Electronically Signed   By: Genia Del M.D.   On: 08/06/2016 15:38   EKG: Independently reviewed. 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 A. fib with rapid ventricular response. chadvasc score 6. Patient denies chest pain palpitations. EKG as noted above. Initial troponin negative. He reports compliance with home medications include metoprolol. Diltiazem drip initiated in heart rate with improved control on admission. Note patient  hospitalized a few weeks ago with MCA embolic infarct. During that hospitalization he had a cardiology consult at which time his metoprolol was increased to 50 mg 3 times a day. Cardiology note dated October 17 indicates if tachycardia uncontrolled with beta blocker would consider EP team referral -Admit to step down -Continue Cardizem drip and titrate -Continue home metoprolol -Obtain a TSH -Not anticoagulation candidate due to history of GI bleed -Start summary dated September 30 indicates possibility of outpatient follow-up with Dr. Harrington Challenger to consider possibility of anticoagulation given that his recent stroke most likely due to A. fib and is not on anticoagulation due to history of GI bleed  #2. Hypertension. Controlled in the emergency department. His baseline is a somewhat soft blood pressure. As noted above metoprolol increased last hospitalization -Continue home meds -titrate Cardizem as able -Monitor  #3. Liver cirrhosis. Likely related to EtOH and hepatitis C. Patient continues to drink but states "not like I used to". No abdominal pain. Recent CT abdomen pelvis did show evidence of a liver nodule. LFT within limits of normal -follow INR -Of note EGD done 2016 showed no varices  #4. History of GI bleed. INR pending. EGD showed gastritis likely related to NSAID use. Hg stable -Monitor  5. EtOH abuse. Reports his last drink was last night. States "I don't drink like they used to. -Follow EtOH level -CIWA protocol -No signs symptoms of withdrawal on admission  #6. Elevated lactic acid in setting UTI.  Has loose stool but reports no more than usual. Afebrile non-toxic appearing. Chest xray without infiltrate. -iv fluids -rocephin for uti -follow urine culture -track lactic acid -consider bicarb  #7. Hypothyroidism. Recent TSH abnormal.  -will repeat TSH -hold synthroid for now -OP follow up  7. recent right MCA embolic infarct. Mild left hemiparesis. Discharged with home  health. Appears stable -PT  #8. Anemia of chronic disease. Globin 11.4 on admission. This appears close to baseline. No signs symptoms of active bleeding -Monitor  9. Thrombocytopenia. New. Platelets 27. Maybe related to etoh and or chronic hep C.  -monitor -track INR   DVT prophylaxis: scd  Code Status: full Family Communication:   Disposition Plan: home  Consults called: cardiology card master  Admission status: inpatient    Radene Gunning MD [facility]  If 7PM-7AM, please contact night-coverage [email or web address] Password [facility]  08/06/2016, 5:38 PM

## 2016-08-06 NOTE — ED Provider Notes (Signed)
[facility] DEPT Provider Note   CSN: [id number] Arrival date & time: 08/06/16  1349     History   Chief Complaint Chief Complaint  Patient presents with  . Tachycardia  . Diarrhea    HPI Nathan Frank is a 75 y.o. male.  The history is provided by the patient. No language interpreter was used.  Diarrhea     Nathan Frank is a 75 y.o. male who presents to the Emergency Department complaining of rapid heart rate.  He was referred to the emergency department today because his physical therapist noticed that his heart rate was elevated. He denies any complaints. He was recently discharged from the hospital following admission for stroke with left-sided weakness. He states his strength is over improving overall. He denies any fevers, chest pain, shortness of breath, abdominal pain, nausea, vomiting, black or bloody stools. Past Medical History:  Diagnosis Date  . Abnormal TSH   . Acute gastric ulcer   . Acute gastritis with hemorrhage   . Alcohol abuse   . Alcohol dependence ([place]) 02/02/2014  . Anemia   . CHF (congestive heart failure) ([place])   . Cirrhosis ([place]) 06/04/2012  . Depressive disorder 02/01/2014  . Dizziness and giddiness 12/13/2014  . DVT of lower limb, acute ([place]) 06/06/2012  . Essential hypertension   . Fatty liver   . Gallstones   . Gastritis 06/07/2012  . GERD (gastroesophageal reflux disease)   . GI bleed due to NSAIDs 12/13/2014  . Granulomatous gastritis   . Hematuria 06/04/2012  . Hepatitis C   . Hepatocellular carcinoma ([place])   . Heroin abuse    "I haven't done that since I don't know when."  . Heroin overdose 02/20/2014  . LV dysfunction    a. 04/2015: EF 45-50% by cath.  . Neuropathy ([place])   . NSTEMI (non-ST elevated myocardial infarction) ([place])    a. 04/2015 - patent coronaries. Etiology possibly due to coronary spasm versus embolus, stress cardiomyopathy (atypical), and aborted infarction related to plaque rupture with thrombosis and dissolution.  Amlodipine started. Not on antiplatelets due to GIB/cirrhosis history.  . Oral thrush 06/05/2012  . Polysubstance abuse    Rare marijuana.  No EtOH x 2 months.   . Prolonged Q-T interval on ECG    a. 12/2014 - treated with magnesium.  . Right knee pain 12/13/2014  . S/P alcohol detoxification 02/02/2014  . SVT (supraventricular tachycardia) ([place])    a. 12/2014 in setting of GIB, ETOH, NSAIDS, gastritis.  . Symptomatic cholelithiasis 12/15/2013  . Thrombocytopenia ([place])   . Upper GI bleeding 12/13/2014  . Weight loss 06/04/2012    Patient Active Problem List   Diagnosis Date Noted  . Acute blood loss anemia   . Lymphocytosis   . Atrial tachycardia ([place])   . Left hemiparesis ([address]) 07/04/2016  . Acute ischemic right MCA stroke ([place]) 07/04/2016  . Obesity 07/01/2016  . Family hx-stroke 07/01/2016  . Hyperglycemia 07/01/2016  . Wound of left leg 07/01/2016  . Dysphagia, post-stroke   . Dysarthria, post-stroke   . Pyrexia   . Bleeding gastrointestinal   . Polysubstance abuse   . Acute on chronic combined systolic and diastolic heart failure ([place])   . Mitral valve regurgitation   . Tachypnea   . Anemia of chronic disease   . Leukocytosis   . 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 Hypomagnesemia 06/09/2016  . Acute on chronic diastolic CHF (  congestive heart failure) ([place]) 06/09/2016  . Alcoholic cirrhosis of liver with ascites ([place])   . Other specified hypothyroidism   . Hepatocellular carcinoma ([place])   . Alcohol withdrawal ([place]) 05/27/2016  . Generalized weakness 05/27/2016  . Hypokalemia 05/27/2016  . Dehydration 05/27/2016  . Diarrhea 04/28/2016  . SIRS (systemic inflammatory response syndrome) ([place]) 04/19/2016  . Alcohol use disorder, moderate, in controlled environment, dependence ([place])   . Lactic acidosis   . Tachycardia   . Atrial fibrillation with RVR ([place]) 04/18/2016  . Chest pain 04/18/2016  .  Hypotension 04/18/2016  . Elevated lactic acid level 04/18/2016  . Chronic atrial fibrillation ([place]) 04/18/2016  . SVT (supraventricular tachycardia) ([place])   . Prolonged Q-T interval on ECG   . Neuropathy ([place])   . Essential hypertension   . Thrombocytopenia ([place]) 04/16/2015  . Abnormal TSH 04/16/2015  . NSTEMI (non-ST elevated myocardial infarction) ([place])   . Acute gastric ulcer   . Acute gastritis with hemorrhage   . GI bleed due to NSAIDs 12/13/2014  . Right knee pain 12/13/2014  . Dizziness and giddiness 12/13/2014  . Upper GI bleeding 12/13/2014  . Heroin overdose 02/20/2014  . S/P alcohol detoxification 02/02/2014  . Alcohol dependence ([place]) 02/02/2014  . Depressive disorder 02/01/2014  . Symptomatic cholelithiasis 12/15/2013  . Abdominal pain 12/14/2013  . Gastritis 06/07/2012  . DVT of lower limb, acute ([place]) 06/06/2012  . Oral thrush 06/05/2012  . Alcohol abuse 06/04/2012  . Weight loss 06/04/2012  . 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 Liver cirrhosis ([place]) 06/04/2012  . Hematuria 06/04/2012    Past Surgical History:  Procedure Laterality Date  . CARDIAC CATHETERIZATION N/A 04/15/2015   Procedure: Left Heart Cath and Coronary Angiography;  Surgeon: Belva Crome, MD;  Location: [facility];  Service: Cardiovascular;  Laterality: N/A;  . CIRCUMCISION    . ESOPHAGOGASTRODUODENOSCOPY  06/07/2012   Procedure: ESOPHAGOGASTRODUODENOSCOPY (EGD);  Surgeon: Milus Banister, MD;  Location: [facility];  Service: Endoscopy;  Laterality: N/A;  may need treatment of varices  . ESOPHAGOGASTRODUODENOSCOPY N/A 12/14/2014   Procedure: ESOPHAGOGASTRODUODENOSCOPY (EGD);  Surgeon: Jerene Bears, MD;  Location[facility];  Service: Endoscopy;  Laterality: N/A;  . IR GENERIC HISTORICAL  06/25/2016   IR US GUIDE VASC ACCESS LEFT 06/25/2016 Corrie Mckusick, DO [facility]  . IR GENERIC HISTORICAL  06/25/2016   IR RADIOLOGY PERIPHERAL GUIDED IV START  06/25/2016 Corrie Mckusick, DO [facility]  . IR GENERIC HISTORICAL  06/25/2016   IR PERCUTANEOUS ART THROMBECTOMY/INFUSION INTRACRANIAL INC DIAG ANGIO 06/25/2016 Luanne Bras, MD [facility]  . RADIOLOGY WITH ANESTHESIA N/A 06/25/2016   Procedure: RADIOLOGY WITH ANESTHESIA;  Surgeon: Luanne Bras, MD;  Location: [facility];  Service: Radiology;  Laterality: N/A;  . TONSILLECTOMY         Home Medications    Prior to Admission medications   Medication Sig Start Date End Date Taking? Authorizing Provider  amitriptyline (ELAVIL) 10 MG tablet Take 1 tablet (10 mg total) by mouth at bedtime. 07/30/16   Bary Leriche, PA-C  aspirin EC 325 MG tablet Take 1 tablet (325 mg total) by mouth daily. 06/16/16   Florencia Reasons, MD  Cholecalciferol (VITAMIN D PO) Take 1 tablet by mouth daily.    Historical Provider, MD  folic acid (FOLVITE) 1 MG tablet Take 1 tablet (1 mg total) by mouth daily. 04/21/16   Maryann Mikhail, DO  levothyroxine (SYNTHROID, LEVOTHROID) 50 MCG tablet Take 1 tablet (  50 mcg total) by mouth daily before breakfast. 06/16/16   Florencia Reasons, MD  lipase/protease/amylase (CREON) 12000 units CPEP capsule Take 1 capsule (12,000 Units total) by mouth 3 (three) times daily with meals. Patient not taking: Reported on 06/25/2016 06/16/16   Florencia Reasons, MD  magnesium oxide (MAG-OX) 400 (241.3 Mg) MG tablet Take 1 tablet (400 mg total) by mouth daily. 06/03/16   Verlee Monte, MD  metoprolol (LOPRESSOR) 50 MG tablet Take 1 tablet (50 mg total) by mouth 3 (three) times daily. 07/30/16   Bary Leriche, PA-C  Multiple Vitamin (MULTIVITAMIN WITH MINERALS) TABS tablet Take 1 tablet by mouth daily. 04/21/16   Maryann Mikhail, DO  spironolactone (ALDACTONE) 25 MG tablet Take 0.5 tablets (12.5 mg total) by mouth daily. 07/30/16   Bary Leriche, PA-C  thiamine 100 MG tablet Take 1 tablet (100 mg total) by mouth daily. Patient not taking: Reported on 05/27/2016 04/21/16   Cristal Ford, DO    Family History Family History   Problem Relation Age of Onset  . Stroke Father   . Prostate cancer Brother   . Heart disease Mother     Pacemaker    Social History Social History  Substance Use Topics  . Smoking status: Former Smoker    Packs/day: 0.50    Years: 5.00    Types: Cigarettes  . Smokeless tobacco: Never Used     Comment: "quit smoking cigarettes in the 1970's"  . Alcohol use 16.8 oz/week    28 Glasses of wine per week     Comment: 06/2016 " I quit drinking a couple of weeks ago "     Allergies   Review of patient's allergies indicates no known allergies.   Review of Systems Review of Systems  Gastrointestinal: Positive for diarrhea.  All other systems reviewed and are negative.    Physical Exam Updated Vital Signs BP 99/88 (BP Location: Right Arm)   Pulse (!) 131   Temp 97.7 F (36.5 C)   Resp 19   Ht 5\' 9"  (1.753 m)   Wt 185 lb (83.9 kg)   SpO2 100%   BMI 27.32 kg/m   Physical Exam  Constitutional: He is oriented to person, place, and time. He appears well-developed and well-nourished.  HENT:  Head: Normocephalic and atraumatic.  Cardiovascular:  No murmur heard. Tachycardic and irregular  Pulmonary/Chest: Effort normal and breath sounds normal. No respiratory distress.  Abdominal: Soft. There is no tenderness. There is no rebound and no guarding.  Musculoskeletal: He exhibits no edema or tenderness.  Neurological: He is alert and oriented to person, place, and time.  Skin: Skin is warm and dry.  Psychiatric: He has a normal mood and affect. His behavior is normal.  Nursing note and vitals reviewed.    ED Treatments / Results  Labs (all labs ordered are listed, but only abnormal results are displayed) Labs Reviewed  I-STAT CG4 LACTIC ACID, ED - Abnormal; Notable for the following:       Result Value   Lactic Acid, Venous 3.37 (*)    All other components within normal limits  CBC  COMPREHENSIVE METABOLIC PANEL  URINALYSIS, ROUTINE W REFLEX MICROSCOPIC (NOT AT  [facility])  Randolm Idol, ED    EKG  EKG Interpretation  Date/Time:  Thursday August 06 2016 13:59:22 EDT Ventricular Rate:  141 PR Interval:    QRS Duration: 129 QT Interval:  350 QTC Calculation: 537 R Axis:   -43 Text Interpretation:  Atrial fibrillation RBBB and LAFB  Confirmed by Hazle Coca [phone]) on 08/06/2016 2:08:31 PM       Radiology No results found.  Procedures Procedures  CRITICAL CARE Performed by: Quintella Reichert   Total critical care time: 35 minutes  Critical care time was exclusive of separately billable procedures and treating other patients.  Critical care was necessary to treat or prevent imminent or life-threatening deterioration.  Critical care was time spent personally by me on the following activities: development of treatment plan with patient and/or surrogate as well as nursing, discussions with consultants, evaluation of patient's response to treatment, examination of patient, obtaining history from patient or surrogate, ordering and performing treatments and interventions, ordering and review of laboratory studies, ordering and review of radiographic studies, pulse oximetry and re-evaluation of patient's condition.   Medications Ordered in ED Medications  diltiazem (CARDIZEM) 1 mg/mL load via infusion 10 mg (not administered)    And  diltiazem (CARDIZEM) 100 mg in dextrose 5 % 100 mL (1 mg/mL) infusion (not administered)     Initial Impression / Assessment and Plan / ED Course  I have reviewed the triage vital signs and the nursing notes.  Pertinent labs & imaging results that were available during my care of the patient were reviewed by me and considered in my medical decision making (see chart for details).  Clinical Course    Patient here for asymptomatic tachycardia from physical therapy. He is in A. fib with RVR in the emergency department with heart rates up to the 160s. He was started on diltiazem bolus and drip for rate control.  Lactate is elevated at 3.3 but there does not appear to be acute infection at this time, question if this is due to his A. fib. Labs demonstrate worsening thrombocytopenia, he has no current evidence of bleeding. Plan to admit to the hospitalist service for further treatment.  Final Clinical Impressions(s) / ED Diagnoses   Final diagnoses:  Atrial fibrillation with RVR [facility])    New Prescriptions New Prescriptions   No medications on file     Quintella Reichert, MD 08/07/16 [id number]

## 2016-08-06 NOTE — Consult Note (Signed)
Cardiology Consult    Patient ID: Nathan Frank MRN: [id number], DOB/AGE: 1941-05-26   Admit date: 08/06/2016 Date of Consult: 08/06/2016  Primary Physician: Thressa Sheller, MD Reason for Consult: A Fib with RVR Primary Cardiologist: Dr. Harrington Challenger Requesting Provider: Dr. Marily Memos  Patient Profile    Nathan Frank is a 75 year old male with a past medical history significant for A. 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 He presents today after being seen by physical therapy and noted to have a heart rate in the 150s.   History of Present Illness    Nathan Frank reports that he was being seen by physical therapy today who noted his heart rate to be elevated. Per chart review, his heart rate was in the 150s. He denies any symptoms. Reports that he can never tell when his heart is beating abnormally. Denies any palpatations, shortness of breath, dizziness/lightheadedness, chest pain. No fevers, chills, recent illnesses or sick contacts. He does report one episode of loose stool. No hematochezia or melena. He does complain of dysuria. States that since he came home from his recent hospitalization he has had slight burning with urination, urinary frequency and hesitancy. No hematuria.  Denies any easy bruising or excessive bleeding. Does note that his gums bleed briskly when he brushes his teeth.   Past Medical History   Past Medical History:  Diagnosis Date  . Abnormal TSH   . Acute gastric ulcer   . Acute gastritis with hemorrhage   . Alcohol abuse   . Alcohol dependence ([place]) 02/02/2014  . Anemia   . CHF (congestive heart failure) ([place])   . Cirrhosis ([place]) 06/04/2012  . Depressive disorder 02/01/2014  . Dizziness and giddiness 12/13/2014  . DVT of lower limb, acute ([place]) 06/06/2012  . Essential hypertension   . Fatty liver    . Gallstones   . Gastritis 06/07/2012  . GERD (gastroesophageal reflux disease)   . GI bleed due to NSAIDs 12/13/2014  . Granulomatous gastritis   . Hematuria 06/04/2012  . Hepatitis C   . Hepatocellular carcinoma ([place])   . Heroin abuse    "I haven't done that since I don't know when."  . Heroin overdose 02/20/2014  . LV dysfunction    a. 04/2015: EF 45-50% by cath.  . Neuropathy ([place])   . NSTEMI (non-ST elevated myocardial infarction) ([place])    a. 04/2015 - patent coronaries. Etiology possibly due to coronary spasm versus embolus, stress cardiomyopathy (atypical), and aborted infarction related to plaque rupture with thrombosis and dissolution. Amlodipine started. Not on antiplatelets due to GIB/cirrhosis history.  . Oral thrush 06/05/2012  . Polysubstance abuse    Rare marijuana.  No EtOH x 2 months.   . Prolonged Q-T interval on ECG    a. 12/2014 - treated with magnesium.  . Right knee pain 12/13/2014  . S/P alcohol detoxification 02/02/2014  . SVT (supraventricular tachycardia) ([place])    a. 12/2014 in setting of GIB, ETOH, NSAIDS, gastritis.  . Symptomatic cholelithiasis 12/15/2013  . Thrombocytopenia ([place])   . Upper GI bleeding 12/13/2014  . Weight loss 06/04/2012    Past Surgical History:  Procedure Laterality Date  . CARDIAC CATHETERIZATION N/A 04/15/2015   Procedure: Left Heart Cath and Coronary Angiography;  Surgeon: Belva Crome, MD;  Location: [facility];  Service:  Cardiovascular;  Laterality: N/A;  . CIRCUMCISION    . ESOPHAGOGASTRODUODENOSCOPY  06/07/2012   Procedure: ESOPHAGOGASTRODUODENOSCOPY (EGD);  Surgeon: Milus Banister, MD;  Location: [place] [facility];  Service: Endoscopy;  Laterality: N/A;  may need treatment of varices  . ESOPHAGOGASTRODUODENOSCOPY N/A 12/14/2014   Procedure: ESOPHAGOGASTRODUODENOSCOPY (EGD);  Surgeon: Jerene Bears, MD;  Location: [facility]COPY;  Service: Endoscopy;  Laterality: N/A;  . IR GENERIC HISTORICAL  06/25/2016   IR US GUIDE VASC ACCESS LEFT  06/25/2016 Corrie Mckusick, DO [facility]  . IR GENERIC HISTORICAL  06/25/2016   IR RADIOLOGY PERIPHERAL GUIDED IV START 06/25/2016 Corrie Mckusick, DO [facility]  . IR GENERIC HISTORICAL  06/25/2016   IR PERCUTANEOUS ART THROMBECTOMY/INFUSION INTRACRANIAL INC DIAG ANGIO 06/25/2016 Luanne Bras, MD [facility]  . RADIOLOGY WITH ANESTHESIA N/A 06/25/2016   Procedure: RADIOLOGY WITH ANESTHESIA;  Surgeon: Luanne Bras, MD;  Location: [facility];  Service: Radiology;  Laterality: N/A;  . TONSILLECTOMY       Allergies  No Known Allergies  Inpatient Medications    . amitriptyline  10 mg Oral QHS  . cefTRIAXone (ROCEPHIN)  IV  2 g Intravenous Q24H  . folic acid  1 mg Oral Daily  . [START ON 08/07/2016] magnesium oxide  400 mg Oral Daily  . magnesium sulfate 1 - 4 g bolus IVPB  2 g Intravenous Once  . metoprolol  50 mg Oral TID  . multivitamin with minerals  1 tablet Oral Daily  . sodium bicarbonate  100 mEq Intravenous Once  . spironolactone  12.5 mg Oral Daily  . [START ON 08/07/2016] thiamine  100 mg Oral Daily    Family History    Family History  Problem Relation Age of Onset  . Stroke Father   . Prostate cancer Brother   . Heart disease Mother     Pacemaker    Social History    Social History   Social History  . Marital status: Married    Spouse name: N/A  . Number of children: 3  . Years of education: N/A   Occupational History  . Retired Education officer, museum    Social History Main Topics  . Smoking status: Former Smoker    Packs/day: 0.50    Years: 5.00    Types: Cigarettes  . Smokeless tobacco: Never Used     Comment: "quit smoking cigarettes in the 1970's"  . Alcohol use 16.8 oz/week    28 Glasses of wine per week     Comment: 06/2016 " I quit drinking a couple of weeks ago "  . Drug use: No  . Sexual activity: Not Currently    Birth control/ protection: Condom   Other Topics Concern  . Not on file   Social History Narrative   Lost one son to a gunshot.   Lives with wife.       Review of Systems    General:  No chills, fever, night sweats or weight changes.  Cardiovascular:  No chest pain, dyspnea on exertion, edema, orthopnea, palpitations, paroxysmal nocturnal dyspnea. Dermatological: No rash, lesions/masses Respiratory: No cough, dyspnea Urologic: No hematuria, dysuria Abdominal:   No nausea, vomiting, diarrhea, bright red blood per rectum, melena, or hematemesis Neurologic:  No visual changes, wkns, changes in mental status. All other systems reviewed and are otherwise negative except as noted above.  Physical Exam    Blood pressure 109/72, pulse 92, temperature 98.2 F (36.8 C), temperature source Rectal, resp. rate 12, height 5\' 9"  (1.753 m), weight  185 lb (83.9 kg), SpO2 100 %.  General: Pleasant, NAD, eating a sandwich Psych: Normal affect. Neuro: Alert and oriented X 3. Moves all extremities spontaneously. HEENT: Normal  Neck: Supple without bruits or JVD. Lungs:  Resp regular and unlabored, CTA. Heart: irregularly irregular rhythm, tachycardic , no s3, s4, or murmurs. Abdomen: Soft, non-tender, non-distended, BS + x 4.  Extremities: No clubbing, cyanosis or edema. DP/PT/Radials 2+ and equal bilaterally.  Labs    Troponin (Point of Care Test)  Recent Labs  08/06/16 1431  TROPIPOC 0.01   No results for input(s): CKTOTAL, CKMB, TROPONINI in the last 72 hours. Lab Results  Component Value Date   WBC 3.8 (L) 08/06/2016   HGB 11.4 (L) 08/06/2016   HCT 34.3 (L) 08/06/2016   MCV 88.2 08/06/2016   PLT 27 (LL) 08/06/2016    Recent Labs Lab 08/06/16 1427 08/06/16 1535  NA 141  --   K 3.7 3.5  CL 114*  --   CO2 18*  --   BUN 12  --   CREATININE 0.94  --   CALCIUM 8.3*  --   PROT 6.0*  --   BILITOT 1.0  --   ALKPHOS 54  --   ALT 26  --   AST 57*  --   GLUCOSE 85  --    Lab Results  Component Value Date   CHOL 87 06/26/2016   HDL 26 (L) 06/26/2016   LDLCALC 38 06/26/2016   TRIG 91 06/27/2016   Lab  Results  Component Value Date   DDIMER 2.97 (H) 06/09/2016     Radiology Studies    Dg Chest 2 View  Result Date: 07/21/2016 CLINICAL DATA:  75 year old male with pulmonary edema. EXAM: CHEST  2 VIEW COMPARISON:  Chest radiograph dated 07/09/2016 FINDINGS: Two views of the chest demonstrate clear lungs. There is no pleural effusion or pneumothorax. Top-normal cardiac silhouette. There is osteopenia with degenerative changes of the spine. No acute fracture. IMPRESSION: No acute cardiopulmonary process. Interval resolution of the previously seen vascular congestion. Electronically Signed   By: Anner Crete M.D.   On: 07/21/2016 05:25   Dg Chest 2 View  Result Date: 07/09/2016 CLINICAL DATA:  CHF EXAM: CHEST  2 VIEW COMPARISON:  07/08/2016 FINDINGS: Enlargement cardiac silhouette with slight pulmonary vascular congestion. Atherosclerotic calcification at arch. Mediastinal contours normal for AP technique on from view. Mild bibasilar atelectasis versus infiltrate. Remaining lungs clear. Minimal central peribronchial thickening, chronic. No pleural effusion or pneumothorax. Bones demineralized. IMPRESSION: Enlargement of cardiac silhouette with pulmonary vascular congestion. Bibasilar atelectasis versus infiltrate slightly increased on LEFT with small bibasilar effusions and mild chronic peribronchial thickening. Electronically Signed   By: Lavonia Dana M.D.   On: 07/09/2016 12:48   Dg Chest 2 View  Result Date: 07/08/2016 CLINICAL DATA:  Shortness of breath with exertion EXAM: CHEST  2 VIEW COMPARISON:  07/03/2016 FINDINGS: Heart is mildly enlarged. Airspace opacity noted posteriorly on the lateral view, likely in the right lower lobe concerning for pneumonia. No effusions. No acute bony abnormality. IMPRESSION: Posterior airspace opacity on the lateral view most compatible with right lower lobe pneumonia. Mild cardiomegaly. Electronically Signed   By: Rolm Baptise M.D.   On: 07/08/2016 16:13   Dg  Chest Port 1 View  Result Date: 08/06/2016 CLINICAL DATA:  75 year old hypertensive male with tachycardia. Initial encounter. EXAM: PORTABLE CHEST 1 VIEW COMPARISON:  07/20/2016. FINDINGS: No infiltrate, congestive heart failure or pneumothorax. No plain film evidence of pulmonary malignancy.  Heart size within normal limits. Calcified aorta. IMPRESSION: No active disease. Electronically Signed   By: Genia Del M.D.   On: 08/06/2016 15:38    EKG & Cardiac Imaging    EKG: A. Fib with RVR  Echocardiogram:  06/16/2016 Study Conclusions  - Left ventricle: Posterior , septal and apical hypokinesis The   cavity size was mildly dilated. Wall thickness was normal.   Systolic function was mildly to moderately reduced. The estimated   ejection fraction was in the range of 40% to 45%. Left   ventricular diastolic function parameters were normal. - Mitral valve: There was mild regurgitation. - Atrial septum: No defect or patent foramen ovale was identified.  Assessment & Plan    A. Fib with RVR:  Patient with asymptomatic A. Fib with RVR confirmed on EKG. HR in the 140-150s on arrival. He denies any chest pain, palpations, shortness of breath, dizziness/lightheadenes. He is not on any anticoagulation secondary to his history of GI bleeds, liver cirrhosis and non-compliance issues. CHAD-VASc is 6. He was started on a Diltiazem gtt in the ED with good rate control. Blood pressures are soft but stable. He was evaluated by the EP team in July 2017 and noted to have several arrhythmias including SVT, atrial tachycardia and atrial fibrillation. He was started on amiodarone at that time but stopped due to his liver disease. He has only been on on metoprolol 50 mg tid at home. Reports compliance.  Does report burning with urination, frequency and urgency. UA is notable for leukocytosis, numerous WBC and bacteria. Hgb present.  TSH was 5.4 on 10/18. On Synthroid 50 mcg daily.  Has a history of drug and ETOH  abuse. Tells me that he last drank 2 months ago (told admitting doctor he last drank last night). Last used heroin about 2 months ago as well. Remote history of cocaine abuse. UDS in the ED positive for Benzos only.  Likely went into RVR secondary to infection with his UTI. Started on ceftriaxone by primary.  -Continue Diltiazem gtt -Metoprolol 50 mg tid -Continue Ceftriaxone -Not a good candidate for [facility] given history, will need further disucssion  Cirrhosis secondary to ETOH abuse and Hep C with new thrombocytopenia He has a history of cirrhosis. His platelets have been consistently in the 200-240 range. Today, it is 27. Recent imaging in September notes cirrhosis, normal spleen and varices. Denies any overt bleeding or easy bruising. LFTs wnl.  -Hold ASA -Repeat CBC now   Signed, Maryellen Pile PGY2 IM Resident [phone] 08/06/2016, 5:36 PM Pager: [phone]  Pt seen and examined  I agree with findings as noted by N Boswell above. Pt known to me from clinic   Hx SVT  Admitted with tachycardia  Found to be in atrial fib with RVR   The pt was admitted earlier this fall with a CVA  Probably embolic NOt on anticoagulation felt to concern for varices and also noncompliance  Review of reocrds from March 2016 endoscopy showed gastritis but no varices. 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 I would recomm rate control for now.   Hgb is mildly decreased  Repeat plt OK  CHADSVASc score is 6   Will review with EP  ? If pt is a candidate for [employer]  device    [name]

## 2016-08-07 LAB — CBC
HCT: 32.7 % — ABNORMAL LOW (ref 39.0–52.0)
Hemoglobin: 10.8 g/dL — ABNORMAL LOW (ref 13.0–17.0)
MCH: 28.8 pg (ref 26.0–34.0)
MCHC: 33 g/dL (ref 30.0–36.0)
MCV: 87.2 fL (ref 78.0–100.0)
PLATELETS: 153 10*3/uL (ref 150–400)
RBC: 3.75 MIL/uL — ABNORMAL LOW (ref 4.22–5.81)
RDW: 15.8 % — ABNORMAL HIGH (ref 11.5–15.5)
WBC: 3.4 10*3/uL — ABNORMAL LOW (ref 4.0–10.5)

## 2016-08-07 LAB — COMPREHENSIVE METABOLIC PANEL
ALT: 22 U/L (ref 17–63)
ANION GAP: 6 (ref 5–15)
AST: 42 U/L — ABNORMAL HIGH (ref 15–41)
Albumin: 2.2 g/dL — ABNORMAL LOW (ref 3.5–5.0)
Alkaline Phosphatase: 51 U/L (ref 38–126)
BUN: 10 mg/dL (ref 6–20)
CALCIUM: 8 mg/dL — AB (ref 8.9–10.3)
CHLORIDE: 114 mmol/L — AB (ref 101–111)
CO2: 24 mmol/L (ref 22–32)
Creatinine, Ser: 0.9 mg/dL (ref 0.61–1.24)
Glucose, Bld: 100 mg/dL — ABNORMAL HIGH (ref 65–99)
Potassium: 3.2 mmol/L — ABNORMAL LOW (ref 3.5–5.1)
SODIUM: 144 mmol/L (ref 135–145)
Total Bilirubin: 0.9 mg/dL (ref 0.3–1.2)
Total Protein: 5.6 g/dL — ABNORMAL LOW (ref 6.5–8.1)

## 2016-08-07 LAB — PROTIME-INR
INR: 1.31
PROTHROMBIN TIME: 16.3 s — AB (ref 11.4–15.2)

## 2016-08-07 MED ORDER — APIXABAN 5 MG PO TABS: 5.0000 mg | ORAL_TABLET | Freq: Two times a day (BID) | ORAL | Status: DC

## 2016-08-07 MED ORDER — SODIUM CHLORIDE 0.9 % IV SOLN: INTRAVENOUS | Status: DC

## 2016-08-07 MED ORDER — DILTIAZEM HCL-DEXTROSE 100-5 MG/100ML-% IV SOLN (PREMIX)
5.0000 mg/h | INTRAVENOUS | Status: DC
  Administered 2016-08-07: 5 mg/h via INTRAVENOUS

## 2016-08-07 MED ORDER — POTASSIUM CHLORIDE CRYS ER 20 MEQ PO TBCR
40.0000 meq | EXTENDED_RELEASE_TABLET | Freq: Once | ORAL | Status: AC
  Administered 2016-08-07: 40 meq via ORAL
  Filled 2016-08-07: qty 2

## 2016-08-07 MED ORDER — APIXABAN 5 MG PO TABS
5.0000 mg | ORAL_TABLET | Freq: Two times a day (BID) | ORAL | Status: DC
  Administered 2016-08-07 – 2016-08-09 (×4): 5 mg via ORAL
  Filled 2016-08-07 (×4): qty 1

## 2016-08-07 MED ORDER — PANTOPRAZOLE SODIUM 40 MG PO TBEC
40.0000 mg | DELAYED_RELEASE_TABLET | Freq: Every day | ORAL | Status: DC
  Administered 2016-08-07 – 2016-08-09 (×3): 40 mg via ORAL
  Filled 2016-08-07 (×3): qty 1

## 2016-08-07 MED ORDER — DILTIAZEM HCL 30 MG PO TABS
30.0000 mg | ORAL_TABLET | Freq: Four times a day (QID) | ORAL | Status: DC
  Administered 2016-08-07 – 2016-08-08 (×4): 30 mg via ORAL
  Filled 2016-08-07 (×4): qty 1

## 2016-08-07 MED ORDER — LEVOTHYROXINE SODIUM 50 MCG PO TABS
50.0000 ug | ORAL_TABLET | Freq: Every day | ORAL | Status: DC
  Administered 2016-08-07 – 2016-08-09 (×3): 50 ug via ORAL
  Filled 2016-08-07 (×3): qty 1

## 2016-08-07 NOTE — Progress Notes (Signed)
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 Doing well this morning. Has no complaints. Denies any chest pain, SOB, dyspnea. Does still have some burning with urination but improving.   Inpatient Medications    . amitriptyline  10 mg Oral QHS  . cefTRIAXone (ROCEPHIN)  IV  2 g Intravenous Q24H  . folic acid  1 mg Oral Daily  . [START ON 08/08/2016] levothyroxine  50 mcg Oral QAC breakfast  . magnesium oxide  400 mg Oral Daily  . metoprolol  50 mg Oral TID  . multivitamin with minerals  1 tablet Oral Daily  . spironolactone  12.5 mg Oral Daily    Vital Signs    Vitals:   08/07/16 0026 08/07/16 0521 08/07/16 0732 08/07/16 1011  BP: 92/63  99/66 93/74  Pulse: 64  77   Resp:   20   Temp: 98.2 F (36.8 C) 97.4 F (36.3 C) 97.8 F (36.6 C)   TempSrc: Oral Oral Oral   SpO2: 98%  99%   Weight:  170 lb 6.4 oz (77.3 kg)    Height:        Intake/Output Summary (Last 24 hours) at 08/07/16 1112 Last data filed at 08/07/16 0924  Gross per 24 hour  Intake              665 ml  Output              100 ml  Net              565 ml   Filed Weights   08/06/16 1405 08/06/16 1703 08/07/16 0521  Weight: 185 lb (83.9 kg) 185 lb (83.9 kg) 170 lb 6.4 oz (77.3 kg)    Physical Exam   General: Pleasant, NAD, eating a sandwich Psych: Normal affect. Neuro: Alert and oriented X 3. Moves all extremities  spontaneously. HEENT: Normal           Neck: Supple without bruits or JVD. Lungs:  Resp regular and unlabored, CTA. Heart: irregularly irregular rhythm, regular rate, no s3, s4, or murmurs. Abdomen: Soft, non-tender, non-distended, BS + x 4.  Extremities: No clubbing, cyanosis or edema. DP/PT/Radials 2+ and equal bilaterally.  Labs    CBC  Recent Labs  08/06/16 2152 08/07/16 0246  WBC 3.8* 3.4*  HGB 11.4* 10.8*  HCT 34.2* 32.7*  MCV 87.9 87.2  PLT 178 0000000   Basic Metabolic Panel  Recent Labs  08/06/16 1427 08/06/16 1535 08/07/16 0246  NA 141  --  144  K 3.7 3.5 3.2*  CL 114*  --  114*  CO2 18*  --  24  GLUCOSE 85  --  100*  BUN 12  --  10  CREATININE 0.94  --  0.90  CALCIUM 8.3*  --  8.0*  MG  --  1.6*  --    Liver Function Tests  Recent Labs  08/06/16 1427 08/07/16 0246  AST 57* 42*  ALT 26 22  ALKPHOS 54 51  BILITOT 1.0 0.9  PROT 6.0* 5.6*  ALBUMIN 2.4* 2.2*   Thyroid Function Tests  Recent Labs  08/06/16 1713  TSH 3.005    Telemetry    A. Fib - rate controlled   Radiology    Dg Chest Port 1 View  Result Date: 08/06/2016 CLINICAL DATA:  75 year old hypertensive male with tachycardia. Initial encounter. EXAM: PORTABLE CHEST 1 VIEW COMPARISON:  07/20/2016. FINDINGS: No infiltrate, congestive heart failure or pneumothorax. No plain film evidence of pulmonary malignancy. Heart size within normal limits. Calcified aorta. IMPRESSION: No active disease. Electronically Signed   By: Genia Del M.D.   On: 08/06/2016 15:38    Patient Profile     Nathan Frank is a 75 year old male with a past medical history significant for A. 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 He presents today after being seen by physical therapy and noted to have a heart rate in the 150s.   Assessment &  Plan    A. Fib with RVR:  Rates controlled on Diltiazem gtt. Titrated down to 5 mg/hr this morning with good rate control in the 6-70s. He has remained asymptomatic throughout. Blood pressures were soft this morning, in the 0000000 systolic. Denied any symptoms. Will stop the Diltiazem gtt today and transition to oral. Will start with Cardizem 30 mg q6hr and titrate up as needed. Continue metoprolol 50 mg tid.   CHAD-VASc is 6. Not on AC due to concern for varicies and noncompliance. Plts initially 27 on arrival but recheck showed normal plt level. Reviewed with EP and discuss if patient is a candidate for [facility] device. He may be a candidate but will need to be on [facility] for short term prior to procedure. Will need to discuss Eliquis with him today.   UTI On Ceftriaxone   Cirrhosis secondary to ETOH abuse and Hep C  Repeat plts improved, stable in the 120s.   Hypokalemia - Mg was 1.6 yesterday and repleted. K 3.2 this morning. Got KDur 40 mEq PO this morning.   [name] PGY2 IM Resident [phone] 08/07/2016, 11:12 AM  Pt seen and examined  I have reviewed assessment by N Boswell Pt remains in Afib  HR 90s  To 120s  BP 90s (baseline)   Would check orthostatics today  He has been switched to oral diltiazem  COntinue b blocker  I have reviewed EP  With CVA question if he is a candidate for a [facility] device.  WOuld recomm initiate Eliquis in short term  Would need to be on this for a defined period  They will see pt as outpt to complete assessment.  WOuld require TEE Start Protonix today  Reviewed with pharmacy    [name]

## 2016-08-07 NOTE — Care Management Note (Addendum)
Case Management Note  Patient Details  Name: [name] MRN: [id number] Date of Birth: 1941/03/10  Subjective/Objective:   Pt presented for Atrial Fib with RVR. Initiated on IV Cardizem gtt. Pt is from home and PTA was active with [facility] for RN, PT/OT, ST. Pt is from home with wife. Per pt his grandson takes hom to MD appointments. Pt will benefit from PT/OT consult before d/c.                   Action/Plan: [facility] is aware that pt is hospitalized and will continue to follow. [facility] Consult placed as well. CM will continue to monitor.   Expected Discharge Date:                  Expected Discharge Plan:  [place]  In-House Referral:     Discharge planning Services  CM Consult  Post Acute Care Choice:  Home Health, Resumption of Svcs/PTA Provider Choice offered to:  Patient  DME Arranged:  N/A DME Agency:  NA  HH Arranged:  RN, PT, OT, Speech Therapy HH Agency:  [facility]  Status of Service:  In process, will continue to follow  If discussed at Long Length of Stay Meetings, dates discussed:    Additional Comments: Bethena Roys, RN 08/07/2016, 11:01 AM

## 2016-08-07 NOTE — Progress Notes (Signed)
[facility]  Patient Status: Active (receiving services up to time of hospitalization)  [facility] is providing the following services: RN, PT, OT and ST  If patient discharges after hours, please call [phone].   Nathan Frank 08/07/2016, 12:07 PM

## 2016-08-07 NOTE — Progress Notes (Signed)
Spoke with Dr. Charlynn Grimes and asked to please put hold parameters on Cardizem and metoprolol. Metoprolol held x 2 today. Cont to monitor. Carroll Kinds RN

## 2016-08-07 NOTE — Progress Notes (Signed)
PROGRESS NOTE  Nathan Frank  [id number] DOB: 1940-10-24 DOA: 08/06/2016 PCP: Thressa Sheller, MD  Also receives care at the [facility]  Brief Narrative: Nathan Frank is a very pleasant 75 y.o. 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 The patient has not had any symptoms associated with this. On arrival he was afebrile with a slightly soft blood pressure which is his baseline and an irregular heart rate of 146. He is provided with diltiazem bolus and diltiazem drip initiated for treatment of AFib w/RVR. 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 Asymptomatic. Troponins negative. TSH wnl. - Continue home metoprolol - Dilt gtt transitioned to 5 this AM and now to po with control of rate < 90 and soft BP.  - Appreciate cardiology and EP consults: plan is to start limited duration eliquis and follow up with EP for consideration of watchman.  - Transfer to floor  Hypertension: Chronic, stable. Low/normal BP currently with diltiazem addition.  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 LFTs wnl. Albumin 2.2, INR 1.24. CT showed evidence of varices not seen on 2016 EGD. - Monitor thrombocytopenia (resolved) - [facility] - Alcohol cessation counseling provided  Liver nodule: Recent CT abdomen pelvis did show evidence of a liver nodule. - Consider outpatient follow up  History of GI bleed: without active bleeding. EGD showed gastritis likely related to NSAID use. Hg stable - Monitor  EtOH abuse. EtOH negative at admission.  - CIWA protocol - No signs symptoms of withdrawal  Elevated lactic acid in setting UTI: Nontoxic without leukocytosis or other SIRS. - Continue CTX for UTI  - D/C'ed IVF given reduced EF - follow urine culture  Hypothyroidism: TSH wnl. 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  - Was on ASA, now eliquis - PT - Consider statin  Anemia of chronic disease: Hgb 11.4 on admission. This appears close to baseline. No signs symptoms of active bleeding - Monitor  DVT prophylaxis: Eliquis Code Status: Full Family Communication: None at bedside Disposition Plan: Discharge to home once heart rate controlled.  Consultants:   Cardiology  Procedures:   None  Antimicrobials:  CTX 11/2 >>    Subjective: Patient without complaints. Still no symptoms from heart rate. Some urinary urgency without abd pain or fever.  Objective: Vitals:   08/07/16 1011 08/07/16 1132 08/07/16 1406 08/07/16 1602  BP: 93/74 102/70 94/68 102/71  Pulse:  72 86 87  Resp:  18 17 16   Temp:  98.3 F (36.8 C)  98 F (36.7 C)  TempSrc:  Oral  Oral  SpO2:  98% 98% 100%  Weight:      Height:        Intake/Output Summary (Last 24 hours) at 08/07/16 1610 Last data filed at 08/07/16 1428  Gross per 24 hour  Intake             1145 ml  Output               100 ml  Net             1045 ml   Filed Weights   08/06/16 1405 08/06/16 1703 08/07/16 0521  Weight: 83.9 kg (185 lb) 83.9 kg (185 lb) 77.3 kg (170 lb 6.4 oz)    Examination: General exam: 75 y.o. male in no distress Respiratory system: Non-labored breathing ambient air. Clear to auscultation bilaterally.  Cardiovascular system: Regular rate and rhythm. No murmur, rub, or gallop. No JVD, and no pedal edema. Gastrointestinal system: Abdomen soft, non-tender, non-distended, with normoactive bowel sounds. No organomegaly or masses felt. Central nervous system: Alert and oriented. Mild left hemiparesis (4/5 grip and plantarflexion strength) Extremities: Warm, no deformities Skin: No rashes, lesions no ulcers Psychiatry: Judgement and insight appear normal. Mood & affect appropriate.   Data Reviewed: I have personally reviewed following labs and imaging studies  CBC:  Recent Labs Lab 08/06/16 1408 08/06/16 1745 08/06/16 2152 08/07/16 0246  WBC 3.8* 4.7 3.8* 3.4*  HGB 11.4* 12.5* 11.4* 10.8*  HCT 34.3* 37.7* 34.2* 32.7*  MCV 88.2 88.7 87.9 87.2  PLT 27* 203 178 0000000   Basic Metabolic Panel:  Recent Labs Lab 08/06/16 1427 08/06/16 1535 08/07/16 0246  NA 141  --  144  K 3.7 3.5 3.2*  CL 114*  --  114*  CO2 18*  --  24  GLUCOSE 85  --  100*  BUN 12  --  10  CREATININE 0.94  --  0.90  CALCIUM 8.3*  --  8.0*  MG  --  1.6*  --    GFR: Estimated Creatinine Clearance: 70.9 mL/min (by C-G formula based on SCr of 0.9 mg/dL). Liver Function Tests:  Recent Labs Lab 08/06/16 1427 08/07/16 0246  AST 57* 42*  ALT 26 22  ALKPHOS 54 51  BILITOT 1.0 0.9  PROT 6.0* 5.6*  ALBUMIN 2.4* 2.2*   No results for input(s): LIPASE, AMYLASE in the last 168 hours. No results for input(s): AMMONIA in the last 168 hours. Coagulation Profile:  Recent Labs Lab 08/06/16 1713 08/07/16 0246  INR 1.24 1.31   Cardiac Enzymes: No results for input(s): CKTOTAL, CKMB, CKMBINDEX, TROPONINI  in the last 168 hours. BNP (last 3 results) No results for input(s): PROBNP in the last 8760 hours. HbA1C: No results for input(s): HGBA1C in the last 72 hours. CBG: No results for input(s): GLUCAP in the last 168 hours. Lipid Profile: No results for input(s): CHOL, HDL, LDLCALC, TRIG, CHOLHDL, LDLDIRECT in the last 72 hours. Thyroid Function Tests:  Recent Labs  08/06/16 1713  TSH 3.005   Anemia Panel: No results for input(s): VITAMINB12, FOLATE, FERRITIN, TIBC, IRON, RETICCTPCT in the last 72 hours. Urine analysis:    Component Value Date/Time   COLORURINE YELLOW 08/06/2016 1610   APPEARANCEUR TURBID (A) 08/06/2016 1610   LABSPEC 1.015 08/06/2016 1610   PHURINE 6.0 08/06/2016 1610   GLUCOSEU NEGATIVE 08/06/2016  [place] (A) 08/06/2016 1610   BILIRUBINUR NEGATIVE 08/06/2016 1610   KETONESUR NEGATIVE 08/06/2016 1610   PROTEINUR 100 (A) 08/06/2016 1610   UROBILINOGEN 2.0 (H) 12/13/2014 1256   NITRITE NEGATIVE 08/06/2016 1610   LEUKOCYTESUR LARGE (A) 08/06/2016 1610   Sepsis Labs: @LABRCNTIP (procalcitonin:4,lacticidven:4)  )No results found for this or any previous visit (from the past 240 hour(s)).   Radiology Studies: Dg Chest Port 1 View  Result Date: 08/06/2016 CLINICAL DATA:  76 year old hypertensive male with tachycardia. Initial encounter. EXAM: PORTABLE CHEST 1 VIEW COMPARISON:  07/20/2016. FINDINGS: No infiltrate, congestive heart failure or pneumothorax. No plain film evidence of pulmonary malignancy. Heart size within normal limits. Calcified aorta. IMPRESSION: No active disease. Electronically Signed   By: Genia Del M.D.   On: 08/06/2016 15:38    Scheduled Meds: . amitriptyline  10 mg Oral QHS  . apixaban  5 mg Oral BID  . cefTRIAXone (ROCEPHIN)  IV  2 g Intravenous Q24H  . diltiazem  30 mg Oral 99991111  . folic acid  1 mg Oral Daily  . [START ON 08/08/2016] levothyroxine  50 mcg Oral QAC breakfast  . magnesium oxide  400 mg Oral Daily  .  metoprolol  50 mg Oral TID  . multivitamin with minerals  1 tablet Oral Daily  . pantoprazole  40 mg Oral Daily  . spironolactone  12.5 mg Oral Daily   Continuous Infusions:    LOS: 1 day   Time spent: 25 minutes.  Vance Gather, MD [facility] Pager [phone]  If 7PM-7AM, please contact night-coverage [email or web address] Password [email or web address] 08/07/2016, 4:10 PM

## 2016-08-08 LAB — BASIC METABOLIC PANEL
Anion gap: 3 — ABNORMAL LOW (ref 5–15)
BUN: 10 mg/dL (ref 6–20)
CALCIUM: 8.4 mg/dL — AB (ref 8.9–10.3)
CO2: 20 mmol/L — AB (ref 22–32)
Chloride: 114 mmol/L — ABNORMAL HIGH (ref 101–111)
Creatinine, Ser: 0.8 mg/dL (ref 0.61–1.24)
GFR calc Af Amer: >60 mL/min (ref >60)
GLUCOSE: 104 mg/dL — AB (ref 65–99)
POTASSIUM: 3.6 mmol/L (ref 3.5–5.1)
Sodium: 137 mmol/L (ref 135–145)

## 2016-08-08 LAB — CBC
HEMATOCRIT: 32 % — AB (ref 39.0–52.0)
Hemoglobin: 10.5 g/dL — ABNORMAL LOW (ref 13.0–17.0)
MCH: 29.1 pg (ref 26.0–34.0)
MCHC: 32.8 g/dL (ref 30.0–36.0)
MCV: 88.6 fL (ref 78.0–100.0)
PLATELETS: 172 10*3/uL (ref 150–400)
RBC: 3.61 MIL/uL — ABNORMAL LOW (ref 4.22–5.81)
RDW: 16.5 % — AB (ref 11.5–15.5)
WBC: 4.2 10*3/uL (ref 4.0–10.5)

## 2016-08-08 MED ORDER — METOPROLOL TARTRATE 50 MG PO TABS
50.0000 mg | ORAL_TABLET | Freq: Three times a day (TID) | ORAL | Status: DC
  Administered 2016-08-08 – 2016-08-09 (×4): 50 mg via ORAL
  Filled 2016-08-08 (×3): qty 1

## 2016-08-08 MED ORDER — DILTIAZEM HCL 30 MG PO TABS: 30.0000 mg | ORAL_TABLET | Freq: Four times a day (QID) | ORAL | Status: DC | PRN

## 2016-08-08 NOTE — Progress Notes (Addendum)
Patient Name: Nathan Frank Date of Encounter: 08/08/2016  Primary Cardiologist: Dr. 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 Missed 2 doses of metoprolol yesterday. He takes metoprolol 50 mg 3 times a day at home. Diltiazem 30 mg by mouth every 6 hours was started here. This was a transition from IV.  He has been asymptomatic with his hypotension. No chest pain. No shortness of breath.  Inpatient Medications    Scheduled Meds: . amitriptyline  10 mg Oral QHS  . apixaban  5 mg Oral BID  . cefTRIAXone (ROCEPHIN)  IV  2 g Intravenous Q24H  . diltiazem  30 mg Oral 99991111  . folic acid  1 mg Oral Daily  . levothyroxine  50 mcg Oral QAC breakfast  . magnesium oxide  400 mg Oral Daily  . metoprolol  50 mg Oral TID  . multivitamin with minerals  1 tablet Oral Daily  . pantoprazole  40 mg Oral Daily  . spironolactone  12.5 mg Oral Daily   Continuous Infusions:   PRN Meds: acetaminophen **OR** acetaminophen, ondansetron **OR** ondansetron (ZOFRAN) IV   Vital Signs    Vitals:   08/08/16 0015 08/08/16 0418 08/08/16 0747 08/08/16 0843  BP: 111/77 102/78  96/73  Pulse:  74 71 82  Resp:  18 16 16   Temp:  98.4 F (36.9 C) 98.2 F (36.8 C) 98.4 F (36.9 C)  TempSrc:  Oral Oral Oral  SpO2:  96% 98% 98%  Weight:  193 lb 9.6 oz (87.8 kg)    Height:         Intake/Output Summary (Last 24 hours) at 08/08/16 0942 Last data filed at 08/08/16 0844  Gross per 24 hour  Intake              360 ml  Output              450 ml  Net              -90 ml   Filed Weights   08/06/16 1703 08/07/16 0521 08/08/16 0418  Weight: 185 lb (83.9 kg) 170 lb 6.4 oz (77.3 kg) 193 lb 9.6 oz (87.8 kg)    Physical Exam    GEN: Well nourished, well developed, in no acute distress.  HEENT: Grossly normal.  Neck: Supple, no JVD, carotid bruits, or masses. Cardiac: Irregularly irregular, no murmurs, rubs, or gallops. No clubbing, cyanosis, edema.  Radials/DP/PT 2+ and equal bilaterally.  Respiratory:  Respirations regular and unlabored, clear to auscultation bilaterally. GI: Soft, nontender, nondistended, BS + x 4. MS: no deformity or atrophy. Skin: warm and dry, no rash. Neuro:  Strength and sensation are intact. Psych: AAOx3.  Normal affect.  Labs    CBC  Recent Labs  08/07/16 0246 08/08/16 0439  WBC 3.4* 4.2  HGB 10.8* 10.5*  HCT 32.7*  32.0*  MCV 87.2 88.6  PLT 153 [id number]   Basic Metabolic Panel  Recent Labs  08/06/16 1535 08/07/16 0246 08/08/16 0439  NA  --  144 137  K 3.5 3.2* 3.6  CL  --  114* 114*  CO2  --  24 20*  GLUCOSE  --  100* 104*  BUN  --  10 10  CREATININE  --  0.90 0.80  CALCIUM  --  8.0* 8.4*  MG 1.6*  --   --    Liver Function Tests  Recent Labs  08/06/16 1427 08/07/16 0246  AST 57* 42*  ALT 26 22  ALKPHOS 54 51  BILITOT 1.0 0.9  PROT 6.0* 5.6*  ALBUMIN 2.4* 2.2*     Recent Labs  08/06/16 1713  TSH 3.005    Telemetry    Atrial flutter currently well rate controlled in the 70s, when sitting up in bed can increase to the 120s - Personally Reviewed  ECG    Atrial flutter with rapid ventricular response - Personally Reviewed  Radiology    Dg Chest Port 1 View  Result Date: 08/06/2016 CLINICAL DATA:  75 year old hypertensive male with tachycardia. Initial encounter. EXAM: PORTABLE CHEST 1 VIEW  COMPARISON:  07/20/2016. FINDINGS: No infiltrate, congestive heart failure or pneumothorax. No plain film evidence of pulmonary malignancy. Heart size within normal limits. Calcified aorta. IMPRESSION: No active disease. Electronically Signed   By: Genia Del M.D.   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  Assessment & Plan    Atrial flutter  - Clearly flutter waves are seen on telemetry. He is having variable conduction. He seems to be under better control especially when laying down with heart rates in the 70s.  - He missed 2 doses of his metoprolol because of hypotension with blood pressures in the 90s yesterday. He was asymptomatic with this. This is likely hypotension/vasodilatory effect in the setting of cirrhosis with concomitant iatrogenic effects. IV Cardizem certainly caused him to be hypotensive.  - I will stop his by mouth Cardizem and change to when necessary dosing.  Will continue his home metoprolol 50 mg 3 times a day as was previously recommended by electrophysiology during prior consultation. Continue with this rate control strategy. I placed hold parameters for systolic blood pressure less than 90.  - CHADS-VASc determined to be 6. He is at risk for future stroke. 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  - Eliquis was started on this admission. Watch for signs of bleeding. Platelet count is normal.  - Dr. Harrington Challenger had discussed with electrophysiology whether or not he is a watchman candidate. My inclination is that he will not be a candidate given his recent history of heroin use, underlying liver  disease.  Stroke  - Recent stroke earlier this fall. He is at high risk for future events.   Signed, Candee Furbish, MD  08/08/2016, 9:42 AM

## 2016-08-08 NOTE — Progress Notes (Signed)
PT Cancellation Note  Patient Details Name: Nathan Frank MRN: [id number] DOB: 1941-10-05   Cancelled Treatment:    Reason Eval/Treat Not Completed: Patient declined, no reason specified.  "I can't today"  Will return in am for PT evaluation.   Despina Pole 08/08/2016, 4:01 PM Carita Pian. Sanjuana Kava, [place] Pager [phone]

## 2016-08-08 NOTE — Progress Notes (Addendum)
PROGRESS NOTE  Nathan Frank  [id number] DOB: Sep 30, 1941 DOA: 08/06/2016 PCP: Thressa Sheller, MD  Also receives care at the [facility]  Brief Narrative: Nathan Frank is a very pleasant 75 y.o. 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 The patient has not had any symptoms associated with this. On arrival he was afebrile with a slightly soft blood pressure which is his baseline and an irregular heart rate of 146. He is provided with diltiazem bolus and diltiazem drip initiated for treatment of AFib w/RVR. 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 CHADsVASc 6 including recent embolic CVA not previously on AC due to h/o GI bleed and cirrhosis with thrombocytopenia. Asymptomatic. Troponins negative. TSH wnl. - Metoprolol held x2, cardiology is restarting metoprolol 50mg  TID with hold parameters if SBP <18mmHg.  - Dilt gtt transitioned to PO which has likely worsened hypotension. This has been changed to prn dosing. - Appreciate cardiology and EP consults: plan is to start eliquis and follow up with EP for consideration of watchman.   Hypertension: Chronic, stable. Low/normal BP  currently with diltiazem addition.  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 LFTs wnl. Albumin 2.2, INR 1.24. CT showed evidence of varices not seen on 2016 EGD. - Monitor thrombocytopenia (resolved) - [facility] - Alcohol cessation counseling provided  Liver nodule: Recent CT abdomen pelvis did show evidence of a liver nodule. - Consider outpatient follow up  History of GI bleed: without active bleeding. EGD showed gastritis likely related to NSAID use. Hg stable - Monitor  EtOH abuse. EtOH negative at admission.  - No signs symptoms of withdrawal: DC CIWA  Elevated lactic acid in setting Klebsiella UTI: Nontoxic without leukocytosis or other SIRS. - Continue CTX for UTI  - D/C'ed IVF given reduced EF - follow urine culture sensitivities  Hypothyroidism: TSH wnl. 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  - Was on ASA, now eliquis - PT - Consider statin  Anemia of chronic disease: Hgb 11.4 on admission. This appears close to baseline. No signs symptoms of active bleeding - Monitor  DVT prophylaxis: Eliquis Code Status: Full Family Communication: None at bedside Disposition Plan: Discharge to home once heart rate controlled without hypotension, 24-48hrs.  Consultants:   Cardiology  Procedures:   None  Antimicrobials:  CTX 11/2 >>    Subjective: Patient without complaints. Still no symptoms from heart rate or hypotension overnight. Some urinary urgency without  abd pain or fever.  Objective: Vitals:   08/08/16 0747 08/08/16 0843 08/08/16 1000 08/08/16 1246  BP:  96/73 96/72   Pulse: 71 82 75 81  Resp: 16 16  15   Temp: 98.2 F (36.8 C) 98.4 F (36.9 C)  98.1 F (36.7 C)  TempSrc: Oral Oral   Oral  SpO2: 98% 98%  99%  Weight:      Height:        Intake/Output Summary (Last 24 hours) at 08/08/16 1626 Last data filed at 08/08/16 1322  Gross per 24 hour  Intake              240 ml  Output              550 ml  Net             -310 ml   Filed Weights   08/06/16 1703 08/07/16 0521 08/08/16 0418  Weight: 83.9 kg (185 lb) 77.3 kg (170 lb 6.4 oz) 87.8 kg (193 lb 9.6 oz)    Examination: General exam: 75 y.o. male in no distress Respiratory system: Non-labored breathing ambient air. Clear to auscultation bilaterally.  Cardiovascular system: Irregular rhythm, normal rate. No murmur, rub, or gallop. No JVD, and no pedal edema. Gastrointestinal system: Abdomen soft, non-tender, non-distended, with normoactive bowel sounds. No organomegaly or masses felt. Central nervous system: Alert and oriented. Mild left hemiparesis (4/5 grip and plantarflexion strength) Extremities: Warm, no deformities Skin: No rashes, lesions no ulcers Psychiatry: Judgement and insight appear normal. Mood & affect appropriate.   Data Reviewed: I have personally reviewed following labs and imaging studies  CBC:  Recent Labs Lab 08/06/16 1408 08/06/16 1745 08/06/16 2152 08/07/16 0246 08/08/16 0439  WBC 3.8* 4.7 3.8* 3.4* 4.2  HGB 11.4* 12.5* 11.4* 10.8* 10.5*  HCT 34.3* 37.7* 34.2* 32.7* 32.0*  MCV 88.2 88.7 87.9 87.2 88.6  PLT 27* 203 178 153 [id number]   Basic Metabolic Panel:  Recent Labs Lab 08/06/16 1427 08/06/16 1535 08/07/16 0246 08/08/16 0439  NA 141  --  144 137  K 3.7 3.5 3.2* 3.6  CL 114*  --  114* 114*  CO2 18*  --  24 20*  GLUCOSE 85  --  100* 104*  BUN 12  --  10 10  CREATININE 0.94  --  0.90 0.80  CALCIUM 8.3*  --  8.0* 8.4*  MG  --  1.6*  --   --    GFR: Estimated Creatinine Clearance: 87.5 mL/min (by C-G formula based on SCr of 0.8 mg/dL). Liver Function Tests:  Recent Labs Lab 08/06/16 1427 08/07/16 0246  AST 57* 42*  ALT 26 22  ALKPHOS 54 51  BILITOT 1.0 0.9    PROT 6.0* 5.6*  ALBUMIN 2.4* 2.2*   No results for input(s): LIPASE, AMYLASE in the last 168 hours. No results for input(s): AMMONIA in the last 168 hours. Coagulation Profile:  Recent Labs Lab 08/06/16 1713 08/07/16 0246  INR 1.24 1.31   Cardiac Enzymes: No results for input(s): CKTOTAL, CKMB, CKMBINDEX, TROPONINI in the last 168 hours. BNP (last 3 results) No results for input(s): PROBNP in the last 8760 hours. HbA1C: No results for input(s): HGBA1C in the last 72 hours. CBG: No results for input(s): GLUCAP in the last 168 hours. Lipid Profile: No results for input(s): CHOL, HDL, LDLCALC, TRIG, CHOLHDL, LDLDIRECT in the last 72 hours. Thyroid Function Tests:  Recent Labs  08/06/16 1713  TSH 3.005   Anemia Panel: No results  for input(s): VITAMINB12, FOLATE, FERRITIN, TIBC, IRON, RETICCTPCT in the last 72 hours. Urine analysis:    Component Value Date/Time   COLORURINE YELLOW 08/06/2016 1610   APPEARANCEUR TURBID (A) 08/06/2016 1610   LABSPEC 1.015 08/06/2016 1610   PHURINE 6.0 08/06/2016 1610   GLUCOSEU NEGATIVE 08/06/2016 1610   HGBUR MODERATE (A) 08/06/2016 1610   BILIRUBINUR NEGATIVE 08/06/2016 1610   KETONESUR NEGATIVE 08/06/2016 1610   PROTEINUR 100 (A) 08/06/2016 1610   UROBILINOGEN 2.0 (H) 12/13/2014 1256   NITRITE NEGATIVE 08/06/2016 1610   LEUKOCYTESUR LARGE (A) 08/06/2016 1610   Sepsis Labs: @LABRCNTIP (procalcitonin:4,lacticidven:4)  ) Recent Results (from the past 240 hour(s))  Culture, Urine     Status: Abnormal (Preliminary result)   Collection Time: 08/06/16  4:10 PM  Result Value Ref Range Status   Specimen Description URINE, RANDOM  Final   Special Requests NONE  Final   Culture >=100,000 COLONIES/mL KLEBSIELLA OXYTOCA (A)  Final   Report Status PENDING  Incomplete     Radiology Studies: No results found.  Scheduled Meds: . amitriptyline  10 mg Oral QHS  . apixaban  5 mg Oral BID  . cefTRIAXone (ROCEPHIN)  IV  2 g Intravenous Q24H   . folic acid  1 mg Oral Daily  . levothyroxine  50 mcg Oral QAC breakfast  . magnesium oxide  400 mg Oral Daily  . metoprolol  50 mg Oral TID  . multivitamin with minerals  1 tablet Oral Daily  . pantoprazole  40 mg Oral Daily  . spironolactone  12.5 mg Oral Daily   Continuous Infusions:    LOS: 2 days   Time spent: 25 minutes.  Vance Gather, MD [facility] Pager [phone]  If 7PM-7AM, please contact night-coverage [email or web address] Password [email or web address] 08/08/2016, 4:26 PM

## 2016-08-09 DIAGNOSIS — K7031 Alcoholic cirrhosis of liver with ascites: Secondary | ICD-10-CM

## 2016-08-09 LAB — CBC
HEMATOCRIT: 32.4 % — AB (ref 39.0–52.0)
Hemoglobin: 10.7 g/dL — ABNORMAL LOW (ref 13.0–17.0)
MCH: 29.6 pg (ref 26.0–34.0)
MCHC: 33 g/dL (ref 30.0–36.0)
MCV: 89.8 fL (ref 78.0–100.0)
PLATELETS: 146 10*3/uL — AB (ref 150–400)
RBC: 3.61 MIL/uL — ABNORMAL LOW (ref 4.22–5.81)
RDW: 16.9 % — AB (ref 11.5–15.5)
WBC: 4.2 10*3/uL (ref 4.0–10.5)

## 2016-08-09 LAB — BASIC METABOLIC PANEL
ANION GAP: 8 (ref 5–15)
BUN: 12 mg/dL (ref 6–20)
CALCIUM: 8.2 mg/dL — AB (ref 8.9–10.3)
CO2: 22 mmol/L (ref 22–32)
CREATININE: 0.83 mg/dL (ref 0.61–1.24)
Chloride: 110 mmol/L (ref 101–111)
GLUCOSE: 86 mg/dL (ref 65–99)
Potassium: 3.9 mmol/L (ref 3.5–5.1)
Sodium: 140 mmol/L (ref 135–145)

## 2016-08-09 LAB — URINE CULTURE: Culture: >=100000 — AB

## 2016-08-09 MED ORDER — APIXABAN 5 MG PO TABS: 5.0000 mg | ORAL_TABLET | Freq: Two times a day (BID) | ORAL | 0 refills | Status: DC

## 2016-08-09 MED ORDER — CEPHALEXIN 500 MG PO CAPS: 500.0000 mg | ORAL_CAPSULE | Freq: Three times a day (TID) | ORAL | 0 refills | Status: DC

## 2016-08-09 NOTE — Progress Notes (Signed)
I reviewed d/c instructions with patient and his wife. D/c'd patient to private vehicle and in stable condition

## 2016-08-09 NOTE — Evaluation (Signed)
Physical Therapy Evaluation Patient Details Name: Nathan Frank MRN: [id number] DOB: [birth year] Today's Date: 08/09/2016   History of Present Illness  Patient is a 75 yo male admitted 08/06/16 with Afib with RVR.   PMH:  ETOH/polysubstance abuse, HTN, cirrhosis, Rt MCA CVA, anemia, Afib, CHF, Hep C  Clinical Impression  Patient presents with problems listed below.  Patient to be d/c home today.  Recommend patient resume HHPT at d/c for continued therapy.    Follow Up Recommendations Home health PT;Supervision/Assistance - 24 hour    Equipment Recommendations  None recommended by PT    Recommendations for Other Services       Precautions / Restrictions Precautions Precautions: Fall Restrictions Weight Bearing Restrictions: No      Mobility  Bed Mobility Overal bed mobility: Modified Independent             General bed mobility comments: Increased time for supine <> sit.  Transfers Overall transfer level: Needs assistance Equipment used: Rolling walker (2 wheeled) Transfers: Sit to/from Stand Sit to Stand: Min assist         General transfer comment: Verbal cues for hand placement.  Assist to power up to standing, and for balance during transition.  Ambulation/Gait Ambulation/Gait assistance: Min guard Ambulation Distance (Feet): 40 Feet Assistive device: Rolling walker (2 wheeled) Gait Pattern/deviations: Step-through pattern;Decreased stride length;Shuffle;Trunk flexed Gait velocity: decreased Gait velocity interpretation: Below normal speed for age/gender General Gait Details: Patient with slow gait.  Fairly steady with RW.  Fatigues quickly.  HR increased to 117 during gait (up from 88).  Returned to sitting.  Stairs            Wheelchair Mobility    Modified Rankin (Stroke Patients Only)       Balance Overall balance assessment: Needs assistance         Standing balance support: Bilateral upper extremity supported Standing  balance-Leahy Scale: Poor                               Pertinent Vitals/Pain Pain Assessment: No/denies pain    Home Living Family/patient expects to be discharged to:: Private residence Living Arrangements: Spouse/significant other Available Help at Discharge: Family;Available 24 hours/day Type of Home: House Home Access: Stairs to enter Entrance Stairs-Rails: Right;Left;Can reach both Entrance Stairs-Number of Steps: 2 Home Layout: One level Home Equipment: Cane - single point;Grab bars - tub/shower;Hand held shower head;Walker - 2 wheels Additional Comments: Reports son and daughter are coming to help out.    Prior Function Level of Independence: Needs assistance   Gait / Transfers Assistance Needed: Uses RW for gait "since stroke"  ADL's / Homemaking Assistance Needed: Assist with ADL's, meal prep, housekeeping        Hand Dominance   Dominant Hand: Right    Extremity/Trunk Assessment   Upper Extremity Assessment: Generalized weakness           Lower Extremity Assessment: Generalized weakness         Communication   Communication: No difficulties  Cognition Arousal/Alertness: Awake/alert Behavior During Therapy: WFL for tasks assessed/performed Overall Cognitive Status: History of cognitive impairments - at baseline                      General Comments      Exercises     Assessment/Plan    PT Assessment Patient needs continued PT services;All further PT needs can be met in  the next venue of care  PT Problem List Decreased strength;Decreased activity tolerance;Decreased balance;Decreased mobility;Decreased coordination;Decreased cognition;Decreased knowledge of use of DME;Cardiopulmonary status limiting activity          PT Treatment Interventions      PT Goals (Current goals can be found in the Care Plan section)  Acute Rehab PT Goals PT Goal Formulation: All assessment and education complete, DC therapy (Patient to  resume HHPT at d/c)    Frequency  (f/u with HHPT)   Barriers to discharge        Co-evaluation               End of Session Equipment Utilized During Treatment: Gait belt Activity Tolerance: Patient limited by fatigue Patient left: in bed;with call bell/phone within reach;with bed alarm set Nurse Communication: Mobility status         Time: 1051-1103 PT Time Calculation (min) (ACUTE ONLY): 12 min   Charges:   PT Evaluation $PT Eval Moderate Complexity: 1 Procedure     PT G CodesDespina Pole 08/19/16, 12:28 PM Carita Pian. Sanjuana Kava, [place] Pager [phone]

## 2016-08-09 NOTE — Care Management Note (Signed)
Case Management Note  Patient Details  Name: Nathan Frank MRN: [id number] Date of Birth: [birth year]  Subjective/Objective:                  Afib RVR Action/Plan: Discharge planning Expected Discharge Date:                  Expected Discharge Plan:  [place]  In-House Referral:     Discharge planning Services  CM Consult, Medication Assistance  Post Acute Care Choice:  Home Health, Resumption of Svcs/PTA Provider Choice offered to:  Patient  DME Arranged:  N/A DME Agency:  NA  HH Arranged:  RN, PT, OT, Speech Therapy HH Agency:  [facility]  Status of Service:  Completed, signed off  If discussed at Door of Stay Meetings, dates discussed:    Additional Comments: CM met with pt and gave pt free 30 day trial card for Eliquis.  Pt verbalized understanding this card will pay for today's discharge prescription and will give enough time for medication to be authorized by insurance for refills.  CM notified [facility] of pt discharge as pt is active with [facility] for [facility] services.  No other CM needs were communicated. Dellie Catholic, RN 08/09/2016, 10:02 AM

## 2016-08-09 NOTE — Discharge Summary (Signed)
Physician Discharge Summary  Nathan Frank [id number] DOB: 12-23-1940 DOA: 08/06/2016  PCP: Thressa Sheller, MD  Admit date: 08/06/2016 Discharge date: 08/09/2016  Admitted From: Home Disposition: Home   Recommendations for Outpatient Follow-up:  1. Follow up with PCP in 1-2 weeks 2. Monitor for signs/symptoms of bleeding, monitor CBC: Eliquis was started this admission. He is high risk due to cirrhosis with thrombocytopenia (currently resolved) and history of GI bleed, but with recent stroke and AFib, benefits of anticoagulation were discussed with the patient and thought to outweigh risks.  3. Follow up with Dr. Harrington Challenger, primary cardiologist, for rate control and discussion for watchman. 4. Monitor HR and BP: Discharged on metoprolol 50mg  TID for rate control. Diltiazem was used inpatient but caused hypotension, so this was not continued at discharge.  5. Consider reevaluation for UTI and work up if recurrent. Discharged on keflex to complete 7 day course for UTI due to non-ESBL Klebsiella.  Home Health: PT, OT, ST, RN Equipment/Devices: None  Discharge Condition: Stable CODE STATUS: Full Diet recommendation: Heart healthy  Brief/Interim Summary: Krithik Padmanabhan Tatumis a very pleasant 75 y.o.male veteran with a history of AFib not on anticoagulation due to GI bleeding, recent embolic CVA, chronic combined CHF, and cirrhosis due to EtOH and hep C who was sent to the ED from home when home health physical therapist found a very rapid heart rate. The patient has not had any symptoms associated with this. On arrival he was afebrile with a slightly soft blood pressure which is his baseline and an irregular heart rate of 146. He is provided with diltiazem bolus and diltiazem drip initiated for treatment of AFib w/RVR. Rate improved and infusion was transitioned to po and metoprolol 50mg  TID was restarted. Hypotension limited use of diltiazem and this was discontinued prior to discharge. HR is stable  in the 70-80s bpm.   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 CHADsVASc 6 including recent embolic CVA not previously on AC due to h/o GI bleed and cirrhosis with thrombocytopenia.Asymptomatic. Troponins negative. TSH wnl. - Metoprolol held x2, cardiology is restarting metoprolol 50mg  TID with hold parameters if SBP <75mmHg.  - Dilt gtt transitioned to PO which has likely worsened hypotension. This has been changed to prn dosing. - Appreciate cardiology and EP consults: plan is to start eliquis and follow up with EP for consideration of watchman.  - Eliquis was started on this admission. Watch for signs of bleeding. Platelet count is normal.  - Dr. Harrington Challenger had discussed with electrophysiology whether or not he is a watchman candidate. My inclination is that he will not be a candidate given his history of heroin use, underlying liver disease. He can follow-up as outpatient for discussion.  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 LFTs wnl. Albumin 2.2, INR 1.24. CT showed  evidence of varices not seen on 2016 EGD. - Monitor thrombocytopenia (resolved) - [facility] - Alcohol cessation counseling  provided  Liver nodule: Recent CT abdomen pelvis did show evidence of a liver nodule. - Consider outpatient follow up  History of GI bleed: without active bleeding. EGD showed gastritis likely related to NSAID use. Hg stable - Monitor, avoid NSAIDs  Elevated lactic acid in setting of non-ESBL Klebsiella UTI: Nontoxic without leukocytosis or other SIRS. 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  - Was on ASA, now eliquis - PT: continue home health at discharge - Consider statin as an outpatient  Discharge Instructions Discharge Instructions    Call MD for:  difficulty breathing, headache or visual disturbances    Complete by:  As directed    Call MD for:  extreme fatigue    Complete by:  As directed    Call MD for:  persistant dizziness or light-headedness    Complete by:  As directed    Call MD for:  persistant nausea and vomiting    Complete by:  As directed    Diet - low sodium heart healthy    Complete by:  As directed    Discharge instructions    Complete by:  As directed    You were admitted for a fast irregular heart rate called atrial fibrillation. This was likely caused by the urinary tract infection for which you have been treated. It has improved since that time. You were started on eliquis, a blood thinner, which helps reduce your risk of second stroke. This is a high risk medication for you with your history, so it is VERY important that you seek medical attention if you notice any unusual bleeding or bruising. You are ready for discharge with the following recommendations:  - START taking eliquis twice daily as directed. A 1 month prescription has been sent to your pharmacy.  - STOP taking aspirin, since you are now taking eliquis.  - START taking keflex three times daily for the next 4 days to complete a 7 day course for urinary tract infection.  - START taking metoprolol 50mg  three times per day  to help limit your heart rate.  - Follow up with Dr. Harrington Challenger to discuss atrial fibrillation. Call on Monday for an appointment.  - Follow up with your primary doctor in the next 1-2 weeks. - Return for immediate medical care if you experience chest pain, trouble breathing, palpitations, or persistent dizziness/light-headedness.   Increase activity slowly    Complete by:  As directed        Medication List    STOP taking these medications   aspirin EC 325 MG tablet     TAKE these medications   amitriptyline 10 MG tablet Commonly known as:  ELAVIL Take 1 tablet (10 mg total) by mouth at bedtime.   apixaban 5 MG Tabs tablet Commonly known as:  ELIQUIS Take 1 tablet (5 mg total) by mouth 2 (two) times daily.   cephALEXin 500 MG capsule Commonly known as:  KEFLEX Take 1 capsule (500 mg total) by mouth 3 (three) times daily.   folic acid 1 MG tablet Commonly known as:  FOLVITE Take 1 tablet (1 mg total) by mouth daily.   levothyroxine 50 MCG tablet Commonly known as:  SYNTHROID, LEVOTHROID Take 1 tablet (50 mcg total) by mouth daily before breakfast.  magnesium oxide 400 (241.3 Mg) MG tablet Commonly known as:  MAG-OX Take 1 tablet (400 mg total) by mouth daily.   metoprolol 50 MG tablet Commonly known as:  LOPRESSOR Take 1 tablet (50 mg total) by mouth 3 (three) times daily.   multivitamin with minerals Tabs tablet Take 1 tablet by mouth daily.   spironolactone 25 MG tablet Commonly known as:  ALDACTONE Take 0.5 tablets (12.5 mg total) by mouth daily.   thiamine 100 MG tablet Take 1 tablet (100 mg total) by mouth daily.   VITAMIN D PO Take 1 tablet by mouth daily.       No Known Allergies  Consultations:  [facility], Dr. Marlou Porch  Procedures/Studies: Dg Chest 2 View  Result Date: 07/21/2016 CLINICAL DATA:  75 year old male with pulmonary edema. EXAM: CHEST  2 VIEW COMPARISON:  Chest radiograph dated 07/09/2016 FINDINGS: Two views of the chest  demonstrate clear lungs. There is no pleural effusion or pneumothorax. Top-normal cardiac silhouette. There is osteopenia with degenerative changes of the spine. No acute fracture. IMPRESSION: No acute cardiopulmonary process. Interval resolution of the previously seen vascular congestion. Electronically Signed   By: Anner Crete M.D.   On: 07/21/2016 05:25   Dg Chest Port 1 View  Result Date: 08/06/2016 CLINICAL DATA:  75 year old hypertensive male with tachycardia. Initial encounter. EXAM: PORTABLE CHEST 1 VIEW COMPARISON:  07/20/2016. FINDINGS: No infiltrate, congestive heart failure or pneumothorax. No plain film evidence of pulmonary malignancy. Heart size within normal limits. Calcified aorta. IMPRESSION: No active disease. Electronically Signed   By: Genia Del M.D.   On: 08/06/2016 15:38     Subjective: Pt remains asymptomatic. HRs jumping to 110s when ambulating, otherwise in 70's.  Discharge Exam: Vitals:   08/09/16 0400 08/09/16 0823  BP: 110/86 102/85  Pulse: 77 97  Resp: 16   Temp: 98.1 F (36.7 C) 98.6 F (37 C)   Vitals:   08/09/16 0003 08/09/16 0400 08/09/16 0500 08/09/16 0823  BP: 108/87 110/86  102/85  Pulse: 72 77  97  Resp: 19 16    Temp: 97.9 F (36.6 C) 98.1 F (36.7 C)  98.6 F (37 C)  TempSrc:    Oral  SpO2: 100% 100%  100%  Weight:   89.8 kg (198 lb)   Height:       General: Pt is alert, awake, not in acute distress Cardiovascular: Irreg irreg, S1/S2 +, no rubs, no gallops Respiratory: CTA bilaterally, no wheezing, no rhonchi Abdominal: Soft, NT, ND, bowel sounds + Extremities: no edema, no cyanosis  The results of significant diagnostics from this hospitalization (including imaging, microbiology, ancillary and laboratory) are listed below for reference.    Microbiology: Recent Results (from the past 240 hour(s))  Culture, Urine     Status: Abnormal   Collection Time: 08/06/16  4:10 PM  Result Value Ref Range Status   Specimen  Description URINE, RANDOM  Final   Special Requests NONE  Final   Culture >=100,000 COLONIES/mL KLEBSIELLA OXYTOCA (A)  Final   Report Status 08/09/2016 FINAL  Final   Organism ID, Bacteria KLEBSIELLA OXYTOCA (A)  Final      Susceptibility   Klebsiella oxytoca - MIC*    AMPICILLIN >=32 RESISTANT Resistant     CEFAZOLIN 8 SENSITIVE Sensitive     CEFTRIAXONE <=1 SENSITIVE Sensitive     CIPROFLOXACIN <=0.25 SENSITIVE Sensitive     GENTAMICIN <=1 SENSITIVE Sensitive     IMIPENEM <=0.25 SENSITIVE Sensitive     NITROFURANTOIN <=16  SENSITIVE Sensitive     TRIMETH/SULFA <=20 SENSITIVE Sensitive     AMPICILLIN/SULBACTAM 16 INTERMEDIATE Intermediate     PIP/TAZO <=4 SENSITIVE Sensitive     Extended ESBL NEGATIVE Sensitive     * >=100,000 COLONIES/mL KLEBSIELLA OXYTOCA     Labs: BNP (last 3 results)  Recent Labs  04/18/16 1855 04/29/16 0305 06/09/16 1500  BNP 375.8* 413.5* [id number]*   Basic Metabolic Panel:  Recent Labs Lab 08/06/16 1427 08/06/16 1535 08/07/16 0246 08/08/16 0439 08/09/16 0409  NA 141  --  144 137 140  K 3.7 3.5 3.2* 3.6 3.9  CL 114*  --  114* 114* 110  CO2 18*  --  24 20* 22  GLUCOSE 85  --  100* 104* 86  BUN 12  --  10 10 12   CREATININE 0.94  --  0.90 0.80 0.83  CALCIUM 8.3*  --  8.0* 8.4* 8.2*  MG  --  1.6*  --   --   --    Liver Function Tests:  Recent Labs Lab 08/06/16 1427 08/07/16 0246  AST 57* 42*  ALT 26 22  ALKPHOS 54 51  BILITOT 1.0 0.9  PROT 6.0* 5.6*  ALBUMIN 2.4* 2.2*   No results for input(s): LIPASE, AMYLASE in the last 168 hours. No results for input(s): AMMONIA in the last 168 hours. CBC:  Recent Labs Lab 08/06/16 1745 08/06/16 2152 08/07/16 0246 08/08/16 0439 08/09/16 0409  WBC 4.7 3.8* 3.4* 4.2 4.2  HGB 12.5* 11.4* 10.8* 10.5* 10.7*  HCT 37.7* 34.2* 32.7* 32.0* 32.4*  MCV 88.7 87.9 87.2 88.6 89.8  PLT 203 178 153 172 146*   Cardiac Enzymes: No results for input(s): CKTOTAL, CKMB, CKMBINDEX, TROPONINI in the last  168 hours. BNP: Invalid input(s): POCBNP CBG: No results for input(s): GLUCAP in the last 168 hours. D-Dimer No results for input(s): DDIMER in the last 72 hours. Hgb A1c No results for input(s): HGBA1C in the last 72 hours. Lipid Profile No results for input(s): CHOL, HDL, LDLCALC, TRIG, CHOLHDL, LDLDIRECT in the last 72 hours. Thyroid function studies  Recent Labs  08/06/16 1713  TSH 3.005   Anemia work up No results for input(s): VITAMINB12, FOLATE, FERRITIN, TIBC, IRON, RETICCTPCT in the last 72 hours. Urinalysis    Component Value Date/Time   COLORURINE YELLOW 08/06/2016 1610   APPEARANCEUR TURBID (A) 08/06/2016 1610   LABSPEC 1.015 08/06/2016 1610   PHURINE 6.0 08/06/2016 1610   GLUCOSEU NEGATIVE 08/06/2016 1610   HGBUR MODERATE (A) 08/06/2016 1610   BILIRUBINUR NEGATIVE 08/06/2016 1610   KETONESUR NEGATIVE 08/06/2016 1610   PROTEINUR 100 (A) 08/06/2016 1610   UROBILINOGEN 2.0 (H) 12/13/2014 1256   NITRITE NEGATIVE 08/06/2016 1610   LEUKOCYTESUR LARGE (A) 08/06/2016 1610   Sepsis Labs Invalid input(s): PROCALCITONIN,  WBC,  LACTICIDVEN Microbiology Recent Results (from the past 240 hour(s))  Culture, Urine     Status: Abnormal   Collection Time: 08/06/16  4:10 PM  Result Value Ref Range Status   Specimen Description URINE, RANDOM  Final   Special Requests NONE  Final   Culture >=100,000 COLONIES/mL KLEBSIELLA OXYTOCA (A)  Final   Report Status 08/09/2016 FINAL  Final   Organism ID, Bacteria KLEBSIELLA OXYTOCA (A)  Final      Susceptibility   Klebsiella oxytoca - MIC*    AMPICILLIN >=32 RESISTANT Resistant     CEFAZOLIN 8 SENSITIVE Sensitive     CEFTRIAXONE <=1 SENSITIVE Sensitive     CIPROFLOXACIN <=0.25 SENSITIVE Sensitive  GENTAMICIN <=1 SENSITIVE Sensitive     IMIPENEM <=0.25 SENSITIVE Sensitive     NITROFURANTOIN <=16 SENSITIVE Sensitive     TRIMETH/SULFA <=20 SENSITIVE Sensitive     AMPICILLIN/SULBACTAM 16 INTERMEDIATE Intermediate     PIP/TAZO  <=4 SENSITIVE Sensitive     Extended ESBL NEGATIVE Sensitive     * >=100,000 COLONIES/mL KLEBSIELLA OXYTOCA    Time coordinating discharge: Over 30 minutes  Vance Gather, MD  [facility] 08/09/2016, 9:07 AM Pager [phone]  If 7PM-7AM, please contact night-coverage [email or web address] Password [email or web address]

## 2016-08-09 NOTE — Progress Notes (Signed)
Patient Name: Nathan Frank Date of Encounter: 08/09/2016  Primary Cardiologist: Dr. 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 No chest pain. No shortness of breath.Laying comfortably in bed. Heart rate hovering around 90-100.  Inpatient Medications    Scheduled Meds: . amitriptyline  10 mg Oral QHS  . apixaban  5 mg Oral BID  . cefTRIAXone (ROCEPHIN)  IV  2 g Intravenous Q24H  . folic acid  1 mg Oral Daily  . levothyroxine  50 mcg Oral QAC breakfast  . magnesium oxide  400 mg Oral Daily  . metoprolol  50 mg Oral TID  . multivitamin with minerals  1 tablet Oral Daily  . pantoprazole  40 mg Oral Daily  . spironolactone  12.5 mg Oral Daily   Continuous Infusions:  PRN Meds: acetaminophen **OR** acetaminophen, diltiazem, ondansetron **OR** ondansetron (ZOFRAN) IV   Vital Signs    Vitals:   08/08/16 2100 08/09/16 0003 08/09/16 0400 08/09/16 0500  BP: 114/78 108/87 110/86   Pulse: 77 72 77   Resp: 20 19 16    Temp: 98.5 F (36.9 C) 97.9 F (36.6 C) 98.1 F (36.7 C)   TempSrc:      SpO2: 100% 100% 100%   Weight:    198 lb (89.8 kg)  Height:        Intake/Output Summary (Last 24 hours) at 08/09/16 0811 Last data filed at 08/09/16 0611  Gross per 24 hour  Intake              420 ml  Output              350 ml  Net               70 ml   Filed Weights   08/07/16 0521 08/08/16 0418 08/09/16 0500  Weight:  170 lb 6.4 oz (77.3 kg) 193 lb 9.6 oz (87.8 kg) 198 lb (89.8 kg)    Physical Exam    GEN: Well nourished, well developed, in no acute distress.  HEENT: Grossly normal.  Neck: Supple, no JVD, carotid bruits, or masses. Cardiac: Irregularly irregular, no murmurs, rubs, or gallops. No clubbing, cyanosis, edema.  Radials/DP/PT 2+ and equal bilaterally.  Respiratory:  Respirations regular and unlabored, clear to auscultation bilaterally. GI: Soft, nontender, nondistended, BS + x 4. MS: no deformity or atrophy. Skin: warm and dry, no rash. Neuro:  Strength and sensation are intact. Psych: AAOx3.  Normal affect.  Labs    CBC  Recent Labs  08/08/16 0439 08/09/16 0409  WBC 4.2 4.2  HGB 10.5* 10.7*  HCT 32.0* 32.4*  MCV 88.6 89.8  PLT 172 [id number]*   Basic Metabolic Panel  Recent Labs  08/06/16 1535  08/08/16 0439 08/09/16 0409  NA  --   < > 137 140  K 3.5  < > 3.6 3.9  CL  --   < > 114* 110  CO2  --   < > 20* 22  GLUCOSE  --   < > 104* 86  BUN  --   < > 10 12  CREATININE  --   < > 0.80 0.83  CALCIUM  --   < > 8.4* 8.2*  MG 1.6*  --   --   --   < > = values in this interval not displayed. Liver Function Tests  Recent Labs  08/06/16 1427 08/07/16 0246  AST 57* 42*  ALT 26 22  ALKPHOS 54 51  BILITOT 1.0 0.9  PROT 6.0* 5.6*  ALBUMIN 2.4* 2.2*     Recent Labs  08/06/16 1713  TSH 3.005    Telemetry    Atrial flutter currently well rate controlled in the 70s, when sitting up in bed can increase to the 120s - Personally Reviewed  ECG    Atrial flutter with rapid ventricular response - Personally Reviewed  Radiology    No results found.  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  Assessment & Plan    Atrial flutter  - Clearly flutter  waves are seen on telemetry. He is having variable conduction. He seems to be under better control especially when laying down with heart rates in the 70s.  - Previously missed a few doses of metoprolol because of hypotension. He was asymptomatic with this. This is likely hypotension/vasodilatory effect in the setting of cirrhosis with concomitant iatrogenic effects. IV Cardizem certainly caused him to be hypotensive.  - Cardizem by mouth change to as needed.  Will continue his home metoprolol 50 mg 3 times a day as was previously recommended by electrophysiology during prior consultation. Continue with this rate control strategy. I placed hold parameters for systolic blood pressure less than 90.  - CHADS-VASc determined to be 6. He is at risk for future stroke. 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  - Eliquis was started on this admission. Watch for signs of bleeding. Platelet count is normal.  - Dr. Harrington Challenger had discussed with electrophysiology whether or not he is a watchman candidate. My inclination is that he will not be a candidate given his history of heroin use, underlying liver disease. He can follow-up as outpatient for discussion.  Stroke  - Recent stroke earlier this fall. He is at high risk for future events.  We will go ahead and sign off. Please let us know if we can be of further assistance.  Signed, Candee Furbish, MD  08/09/2016, 8:11 AM

## 2016-08-10 ENCOUNTER — Other Ambulatory Visit: Payer: Self-pay | Admitting: *Deleted

## 2016-08-10 ENCOUNTER — Encounter: Payer: Self-pay | Admitting: *Deleted

## 2016-08-10 NOTE — Patient Outreach (Signed)
[facility]) Care Management  08/10/2016  Nathan Frank 03-19-1941 [id number]   Member discharged yesterday, 11/5, after being readmitted with atrial fibrillation.  Call placed in attempt to start transition of care program.  Identity verified, this care manager introduces self and purpose of call.  He state that he has [facility] and not [facility].  Difference between home health and [facility] explained, he still insists that he only works with [facility].  [facility] care management services explained again, unfortunately, he state that he only need help with finding a phone number for Dr. [name].  Phone number provided, member immediately ends call.  Outreach letter was sent last week due to inability to [facility] contact.  Will close case at this time as member denies any needs in addition to [place].  Will send primary MD case closure letter.  Will notify care management assistant.  Nathan Frank, [place], MSN [place] [phone]

## 2016-08-12 ENCOUNTER — Emergency Department (HOSPITAL_COMMUNITY)
Admission: EM | Admit: 2016-08-12 | Discharge: 2016-08-12 | Disposition: A | Discharge status: Home / Self Care | Payer: Medicare Other | Attending: Emergency Medicine | Admitting: Emergency Medicine

## 2016-08-12 ENCOUNTER — Emergency Department (HOSPITAL_COMMUNITY): Payer: Medicare Other

## 2016-08-12 ENCOUNTER — Encounter (HOSPITAL_COMMUNITY): Payer: Self-pay | Admitting: Emergency Medicine

## 2016-08-12 DIAGNOSIS — I4891 Unspecified atrial fibrillation: Secondary | ICD-10-CM | POA: Diagnosis not present

## 2016-08-12 DIAGNOSIS — B9689 Other specified bacterial agents as the cause of diseases classified elsewhere: Secondary | ICD-10-CM | POA: Diagnosis not present

## 2016-08-12 DIAGNOSIS — R531 Weakness: Secondary | ICD-10-CM | POA: Diagnosis present

## 2016-08-12 DIAGNOSIS — S199XXA Unspecified injury of neck, initial encounter: Secondary | ICD-10-CM | POA: Diagnosis not present

## 2016-08-12 DIAGNOSIS — Y999 Unspecified external cause status: Secondary | ICD-10-CM | POA: Insufficient documentation

## 2016-08-12 DIAGNOSIS — S299XXA Unspecified injury of thorax, initial encounter: Secondary | ICD-10-CM | POA: Diagnosis not present

## 2016-08-12 DIAGNOSIS — Z87891 Personal history of nicotine dependence: Secondary | ICD-10-CM | POA: Insufficient documentation

## 2016-08-12 DIAGNOSIS — R000 Tachycardia, unspecified: Secondary | ICD-10-CM | POA: Diagnosis not present

## 2016-08-12 DIAGNOSIS — M5489 Other dorsalgia: Secondary | ICD-10-CM | POA: Diagnosis not present

## 2016-08-12 DIAGNOSIS — Z7901 Long term (current) use of anticoagulants: Secondary | ICD-10-CM | POA: Diagnosis not present

## 2016-08-12 DIAGNOSIS — Y929 Unspecified place or not applicable: Secondary | ICD-10-CM | POA: Insufficient documentation

## 2016-08-12 DIAGNOSIS — R259 Unspecified abnormal involuntary movements: Secondary | ICD-10-CM | POA: Diagnosis not present

## 2016-08-12 DIAGNOSIS — M549 Dorsalgia, unspecified: Secondary | ICD-10-CM | POA: Diagnosis not present

## 2016-08-12 DIAGNOSIS — R930 Abnormal findings on diagnostic imaging of skull and head, not elsewhere classified: Secondary | ICD-10-CM | POA: Diagnosis not present

## 2016-08-12 DIAGNOSIS — W06XXXA Fall from bed, initial encounter: Secondary | ICD-10-CM | POA: Diagnosis not present

## 2016-08-12 DIAGNOSIS — M545 Low back pain: Secondary | ICD-10-CM | POA: Diagnosis not present

## 2016-08-12 DIAGNOSIS — Y939 Activity, unspecified: Secondary | ICD-10-CM | POA: Diagnosis not present

## 2016-08-12 DIAGNOSIS — W19XXXA Unspecified fall, initial encounter: Secondary | ICD-10-CM

## 2016-08-12 DIAGNOSIS — N3000 Acute cystitis without hematuria: Secondary | ICD-10-CM

## 2016-08-12 DIAGNOSIS — S0990XA Unspecified injury of head, initial encounter: Secondary | ICD-10-CM | POA: Diagnosis not present

## 2016-08-12 DIAGNOSIS — S3992XA Unspecified injury of lower back, initial encounter: Secondary | ICD-10-CM | POA: Diagnosis not present

## 2016-08-12 DIAGNOSIS — Z79899 Other long term (current) drug therapy: Secondary | ICD-10-CM | POA: Insufficient documentation

## 2016-08-12 DIAGNOSIS — T148XXA Other injury of unspecified body region, initial encounter: Secondary | ICD-10-CM | POA: Diagnosis not present

## 2016-08-12 LAB — BASIC METABOLIC PANEL
Anion gap: 9 (ref 5–15)
BUN: 8 mg/dL (ref 6–20)
CALCIUM: 8.7 mg/dL — AB (ref 8.9–10.3)
CO2: 21 mmol/L — AB (ref 22–32)
CREATININE: 0.98 mg/dL (ref 0.61–1.24)
Chloride: 111 mmol/L (ref 101–111)
GFR calc Af Amer: >60 mL/min (ref >60)
GLUCOSE: 93 mg/dL (ref 65–99)
Potassium: 3.5 mmol/L (ref 3.5–5.1)
Sodium: 141 mmol/L (ref 135–145)

## 2016-08-12 LAB — CBC WITH DIFFERENTIAL/PLATELET
BASOS ABS: 0 10*3/uL (ref 0.0–0.1)
Basophils Relative: 0 %
EOS PCT: 0 %
Eosinophils Absolute: 0 10*3/uL (ref 0.0–0.7)
HEMATOCRIT: 34.9 % — AB (ref 39.0–52.0)
Hemoglobin: 11.7 g/dL — ABNORMAL LOW (ref 13.0–17.0)
LYMPHS ABS: 0.5 10*3/uL — AB (ref 0.7–4.0)
LYMPHS PCT: 14 %
MCH: 29.5 pg (ref 26.0–34.0)
MCHC: 33.5 g/dL (ref 30.0–36.0)
MCV: 87.9 fL (ref 78.0–100.0)
MONO ABS: 0.6 10*3/uL (ref 0.1–1.0)
MONOS PCT: 16 %
NEUTROS ABS: 2.7 10*3/uL (ref 1.7–7.7)
Neutrophils Relative %: 70 %
PLATELETS: 88 10*3/uL — AB (ref 150–400)
RBC: 3.97 MIL/uL — ABNORMAL LOW (ref 4.22–5.81)
RDW: 16.6 % — AB (ref 11.5–15.5)
WBC: 3.8 10*3/uL — ABNORMAL LOW (ref 4.0–10.5)

## 2016-08-12 LAB — URINALYSIS, ROUTINE W REFLEX MICROSCOPIC
Bilirubin Urine: NEGATIVE
GLUCOSE, UA: NEGATIVE mg/dL
KETONES UR: NEGATIVE mg/dL
Nitrite: NEGATIVE
PH: 6.5 (ref 5.0–8.0)
PROTEIN: NEGATIVE mg/dL
Specific Gravity, Urine: 1.013 (ref 1.005–1.030)

## 2016-08-12 LAB — URINE MICROSCOPIC-ADD ON

## 2016-08-12 LAB — I-STAT CG4 LACTIC ACID, ED
Lactic Acid, Venous: 3.46 mmol/L (ref 0.5–1.9)
Lactic Acid, Venous: 2.35 mmol/L (ref 0.5–1.9)

## 2016-08-12 LAB — I-STAT TROPONIN, ED: TROPONIN I, POC: 0 ng/mL (ref 0.00–0.08)

## 2016-08-12 MED ORDER — SODIUM CHLORIDE 0.9 % IV BOLUS (SEPSIS)
500.0000 mL | Freq: Once | INTRAVENOUS | Status: AC
  Administered 2016-08-12: 500 mL via INTRAVENOUS

## 2016-08-12 MED ORDER — METOPROLOL TARTRATE 25 MG PO TABS
50.0000 mg | ORAL_TABLET | Freq: Once | ORAL | Status: AC
  Administered 2016-08-12: 50 mg via ORAL
  Filled 2016-08-12: qty 2

## 2016-08-12 MED ORDER — ACETAMINOPHEN 325 MG PO TABS
650.0000 mg | ORAL_TABLET | Freq: Once | ORAL | Status: AC
  Administered 2016-08-12: 650 mg via ORAL
  Filled 2016-08-12: qty 2

## 2016-08-12 MED ORDER — DEXTROSE 5 % IV SOLN
1.0000 g | Freq: Once | INTRAVENOUS | Status: AC
  Administered 2016-08-12: 1 g via INTRAVENOUS
  Filled 2016-08-12: qty 10

## 2016-08-12 NOTE — ED Notes (Signed)
Pt attempting to use urinal.

## 2016-08-12 NOTE — ED Notes (Signed)
Pt. Verbalized understating of discharge and follow up care with no further questions. [facility] called to come get pt.

## 2016-08-12 NOTE — ED Notes (Signed)
Pt given a [place] sandwich, applesauce, and a ginger ale with ice, per [place], Therapist, sports.

## 2016-08-12 NOTE — ED Notes (Signed)
Pt ambulatory in hallway with walker as patient uses at home. Moderately unsteady at first but resolved to walking with steady gait. Denies dizziness.

## 2016-08-12 NOTE — ED Notes (Signed)
IV team and phlebotomy at bedside. 

## 2016-08-12 NOTE — Discharge Instructions (Signed)
Please read and follow all provided instructions.  Your diagnoses today include:  1. Fall, initial encounter   2. Acute cystitis without hematuria   3. Atrial fibrillation with RVR ([place])     Tests performed today include:  Blood test for your heart and blood counts  Urine test - still shows infection  EKG  Vital signs. See below for your results today.   Medications prescribed:   None  Take any prescribed medications only as directed.  Home care instructions:  Follow any educational materials contained in this packet.  It is very important that you take the cephalexin and metoprolol as prescribed to you.    Follow-up instructions: Please follow-up with your primary care provider in the next 2 days for further evaluation of your symptoms.   Return instructions:   Please return to the Emergency Department if you experience worsening symptoms.   Please return if you have any other emergent concerns.  Additional Information:  Your vital signs today were: BP 102/79 (BP Location: Right Arm)    Pulse 86    Temp 98.3 F (36.8 C) (Rectal)    Resp 22    Ht 5\' 11"  (1.803 m)    Wt 89.8 kg    SpO2 100%    BMI 27.62 kg/m  If your blood pressure (BP) was elevated above 135/85 this visit, please have this repeated by your doctor within one month. --------------

## 2016-08-12 NOTE — ED Notes (Signed)
Performed Orthostatic vital signs.  Pt endorsed that he did not feel comfortable or able to stand.

## 2016-08-12 NOTE — ED Provider Notes (Signed)
Patient seen/examined in the Emergency Department in conjunction with Midlevel Provider [facility] Patient reports he fell earlier in the night.  He reports back pain but no other complaints Exam : awake/alert, no arm/leg drift.  He is in no distress.  He is tachycardic Plan: currently, he appears to be in sinus tach Lactate elevated but this has been seen before.  He is afebrile, low suspicion for sepsis at this time Plan to give IV fluids and reassess heart rate and lactate Imaging pending at this time   EKG Interpretation  Date/Time:  Wednesday August 12 2016 07:50:55 EST Ventricular Rate:  126 PR Interval:    QRS Duration: 139 QT Interval:  353 QTC Calculation: 512 R Axis:   -97 Text Interpretation:  Sinus tachycardia Right bundle branch block Confirmed by Christy Gentles  MD, Mayah Urquidi ([id number]) on 08/12/2016 7:59:46 AM         Ripley Fraise, MD 08/12/16 [phone]

## 2016-08-12 NOTE — ED Notes (Signed)
Pt being transported home by [facility]. VSS.

## 2016-08-12 NOTE — ED Notes (Signed)
Orthostatic vital signs completed, no standing values due to patient needing maximum assistance to sit on edge of bed.

## 2016-08-12 NOTE — ED Triage Notes (Signed)
Per [facility]  Pt fell and does not have neck pain. Neck rolls in place. BP low. Recently discharged. Pt rolled out of bed. [facility] arrived on scene to help him up and realized he was hypotensive. L side deficits from previous strokes. Pt has back pain.  Original vital signs 70/60 134 HR 92% RA 16R 139 CBG

## 2016-08-12 NOTE — ED Provider Notes (Signed)
[facility] DEPT Provider Note   CSN: [id number] Arrival date & time: 08/12/16  0548     History   Chief Complaint Chief Complaint  Patient presents with  . Fall    HPI [name] is a 75 y.o. male.  Patient with medical history significant for A. 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 Patient states that he was trying to get out of bed this morning to use the portable toilet. He lost his balance and fell. He does not think that he hit his head. Wife called EMS. EMS found BP to be in [id number] systolic with a heart rate into the 130's, so he was transported to the hospital. Currently he has no complaints other than middle and lower back pain. He does not feel dizzy or lightheaded. No fevers, CP, abd pain, vomiting, diarrhea. Recent UTI per records. No skin rashes. No hip pain or extremity pain. No LE edema or tenderness. Mild left-sided weakness at baseline. Patient denies signs of stroke including: facial droop, slurred speech, aphasia, weakness/numbness in extremities, imbalance/trouble walking.       Past Medical History:  Diagnosis Date  . Abnormal TSH   . Acute gastric ulcer   . Acute gastritis with hemorrhage   . Alcohol abuse   . Alcohol dependence ([place]) 02/02/2014  . Anemia   . CHF (congestive heart failure) ([place])   . Cirrhosis ([place]) 06/04/2012  . Depressive disorder 02/01/2014  . Dizziness and giddiness 12/13/2014  . DVT of lower limb, acute ([place]) 06/06/2012  . Essential hypertension   . Fatty liver   . Gallstones   . Gastritis 06/07/2012  . GERD (gastroesophageal reflux disease)   . GI bleed due to NSAIDs 12/13/2014  . Granulomatous gastritis   . Hematuria 06/04/2012  . Hepatitis C   . Hepatocellular  carcinoma ([place])   . Heroin abuse    "I haven't done that since I don't know when."  . Heroin overdose 02/20/2014  . LV dysfunction    a. 04/2015: EF 45-50% by cath.  . Neuropathy ([place])   . NSTEMI (non-ST elevated myocardial infarction) ([place])    a. 04/2015 - patent coronaries. Etiology possibly due to coronary spasm versus embolus, stress cardiomyopathy (atypical), and aborted infarction related to plaque rupture with thrombosis and dissolution. Amlodipine started. Not on antiplatelets due to GIB/cirrhosis history.  . Oral thrush 06/05/2012  . Polysubstance abuse    Rare marijuana.  No EtOH x 2 months.   . Prolonged Q-T interval on ECG    a. 12/2014 - treated with magnesium.  . Right knee pain 12/13/2014  . S/P alcohol detoxification 02/02/2014  . SVT (supraventricular tachycardia) ([place])    a. 12/2014 in setting of GIB, ETOH, NSAIDS, gastritis.  . Symptomatic cholelithiasis 12/15/2013  . Thrombocytopenia ([place])   . Upper GI bleeding 12/13/2014  . Weight loss 06/04/2012    Patient Active Problem List   Diagnosis Date Noted  . A-fib ([place]) 08/06/2016  . Atrial fibrillation with rapid ventricular response ([place]) 08/06/2016  . UTI (urinary tract infection) 08/06/2016  . Acute blood loss anemia   . Lymphocytosis   . Atrial tachycardia ([place])   . Left hemiparesis ([place]) 07/04/2016  . Acute ischemic right MCA stroke ([place]) 07/04/2016  . Obesity 07/01/2016  .  Family hx-stroke 07/01/2016  . Hyperglycemia 07/01/2016  . Wound of left leg 07/01/2016  . Dysphagia, post-stroke   . Dysarthria, post-stroke   . Pyrexia   . Bleeding gastrointestinal   . Polysubstance abuse   . Chronic combined systolic and diastolic CHF (congestive heart failure) ([place])   . Mitral valve regurgitation   . Tachypnea   . Anemia of chronic disease   . Leukocytosis   . Cerebral infarction due to embolism of right middle cerebral artery ([place]) - s/p mechanical thrombectomy 06/25/2016  . Hypomagnesemia 06/09/2016  . Acute on  chronic diastolic CHF (congestive heart failure) ([place]) 06/09/2016  . Alcoholic cirrhosis of liver with ascites ([place])   . Other specified hypothyroidism   . Hepatocellular carcinoma ([place])   . Alcohol withdrawal ([place]) 05/27/2016  . Generalized weakness 05/27/2016  . Hypokalemia 05/27/2016  . Dehydration 05/27/2016  . Diarrhea 04/28/2016  . SIRS (systemic inflammatory response syndrome) ([place]) 04/19/2016  . Alcohol use disorder, moderate, in controlled environment, dependence ([place])   . Lactic acidosis   . Tachycardia   . Atrial fibrillation with RVR ([place]) 04/18/2016  . Chest pain 04/18/2016  . Hypotension 04/18/2016  . Elevated lactic acid level 04/18/2016  . Chronic atrial fibrillation ([place]) 04/18/2016  . SVT (supraventricular tachycardia) ([place])   . Prolonged Q-T interval on ECG   . Neuropathy ([place])   . Essential hypertension   . Thrombocytopenia ([place]) 04/16/2015  . Abnormal TSH 04/16/2015  . NSTEMI (non-ST elevated myocardial infarction) ([place])   . Acute gastric ulcer   . Acute gastritis with hemorrhage   . GI bleed due to NSAIDs 12/13/2014  . Right knee pain 12/13/2014  . Dizziness and giddiness 12/13/2014  . Upper GI bleeding 12/13/2014  . Heroin overdose 02/20/2014  . S/P alcohol detoxification 02/02/2014  . Alcohol dependence ([place]) 02/02/2014  . Depressive disorder 02/01/2014  . Symptomatic cholelithiasis 12/15/2013  . Abdominal pain 12/14/2013  . Gastritis 06/07/2012  . DVT of lower limb, acute ([place]) 06/06/2012  . Oral thrush 06/05/2012  . Alcohol abuse 06/04/2012  . Weight loss 06/04/2012  . 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 Liver cirrhosis ([place]) 06/04/2012  . Hematuria 06/04/2012    Past Surgical History:  Procedure Laterality Date  . CARDIAC CATHETERIZATION N/A 04/15/2015   Procedure: Left Heart Cath and Coronary Angiography;  Surgeon: Belva Crome, MD;  Location: [facility];  Service: Cardiovascular;  Laterality: N/A;    . CIRCUMCISION    . ESOPHAGOGASTRODUODENOSCOPY  06/07/2012   Procedure: ESOPHAGOGASTRODUODENOSCOPY (EGD);  Surgeon: Milus Banister, MD;  Location: [facility];  Service: Endoscopy;  Laterality: N/A;  may need treatment of varices  . ESOPHAGOGASTRODUODENOSCOPY N/A 12/14/2014   Procedure: ESOPHAGOGASTRODUODENOSCOPY (EGD);  Surgeon: Jerene Bears, MD;  Location: [facility] ENDOSCOPY;  Service: Endoscopy;  Laterality: N/A;  . IR GENERIC HISTORICAL  06/25/2016   IR US GUIDE VASC ACCESS LEFT 06/25/2016 Corrie Mckusick, DO [facility]  . IR GENERIC HISTORICAL  06/25/2016   IR RADIOLOGY PERIPHERAL GUIDED IV START 06/25/2016 Corrie Mckusick, DO [facility]  . IR GENERIC HISTORICAL  06/25/2016   IR PERCUTANEOUS ART THROMBECTOMY/INFUSION INTRACRANIAL INC DIAG ANGIO 06/25/2016 Luanne Bras, MD [facility]  . RADIOLOGY WITH ANESTHESIA N/A 06/25/2016   Procedure: RADIOLOGY WITH ANESTHESIA;  Surgeon: Luanne Bras, MD;  Location: [facility];  Service: Radiology;  Laterality: N/A;  . TONSILLECTOMY         Home Medications    Prior to Admission medications  Medication Sig Start Date End Date Taking? Authorizing Provider  amitriptyline (ELAVIL) 10 MG tablet Take 1 tablet (10 mg total) by mouth at bedtime. 07/30/16  Yes Ivan Anchors Love, PA-C  apixaban (ELIQUIS) 5 MG TABS tablet Take 1 tablet (5 mg total) by mouth 2 (two) times daily. 08/09/16  Yes Patrecia Pour, MD  cephALEXin (KEFLEX) 500 MG capsule Take 1 capsule (500 mg total) by mouth 3 (three) times daily. 08/09/16  Yes Patrecia Pour, MD  Cholecalciferol (VITAMIN D PO) Take 1 tablet by mouth daily.   Yes Historical Provider, MD  folic acid (FOLVITE) 1 MG tablet Take 1 tablet (1 mg total) by mouth daily. 04/21/16  Yes Maryann Mikhail, DO  levothyroxine (SYNTHROID, LEVOTHROID) 50 MCG tablet Take 1 tablet (50 mcg total) by mouth daily before breakfast. 06/16/16  Yes Florencia Reasons, MD  magnesium oxide (MAG-OX) 400 (241.3 Mg) MG tablet Take 1 tablet (400 mg total) by mouth daily.  06/03/16  Yes Verlee Monte, MD  metoprolol (LOPRESSOR) 50 MG tablet Take 1 tablet (50 mg total) by mouth 3 (three) times daily. 07/30/16  Yes Ivan Anchors Love, PA-C  Multiple Vitamin (MULTIVITAMIN WITH MINERALS) TABS tablet Take 1 tablet by mouth daily. 04/21/16  Yes Maryann Mikhail, DO  spironolactone (ALDACTONE) 25 MG tablet Take 0.5 tablets (12.5 mg total) by mouth daily. 07/30/16  Yes Ivan Anchors Love, PA-C  thiamine 100 MG tablet Take 1 tablet (100 mg total) by mouth daily. Patient not taking: Reported on 08/12/2016 04/21/16   Cristal Ford, DO    Family History Family History  Problem Relation Age of Onset  . Stroke Father   . Prostate cancer Brother   . Heart disease Mother     Pacemaker    Social History Social History  Substance Use Topics  . Smoking status: Former Smoker    Packs/day: 0.50    Years: 5.00    Types: Cigarettes  . Smokeless tobacco: Never Used     Comment: "quit smoking cigarettes in the 1970's"  . Alcohol use 16.8 oz/week    28 Glasses of wine per week     Comment: 06/2016 " I quit drinking a couple of weeks ago "     Allergies   Patient has no known allergies.   Review of Systems Review of Systems  Constitutional: Negative for fatigue and fever.  HENT: Negative for rhinorrhea, sore throat and tinnitus.   Eyes: Negative for photophobia, pain, redness and visual disturbance.  Respiratory: Negative for cough and shortness of breath.   Cardiovascular: Positive for palpitations. Negative for chest pain.  Gastrointestinal: Negative for abdominal pain, diarrhea, nausea and vomiting.  Genitourinary: Negative for dysuria.  Musculoskeletal: Positive for back pain. Negative for gait problem, myalgias and neck pain.  Skin: Negative for rash and wound.  Neurological: Negative for dizziness, weakness, light-headedness, numbness and headaches.  Psychiatric/Behavioral: Negative for confusion and decreased concentration.     Physical Exam Updated Vital Signs BP  (!) 87/65   Pulse (!) 130   Temp 97.8 F (36.6 C) (Oral)   Resp 20   Ht 5\' 11"  (1.803 m)   Wt 89.8 kg   SpO2 100%   BMI 27.62 kg/m   Physical Exam  Constitutional: He appears well-developed and well-nourished.  HENT:  Head: Normocephalic and atraumatic.  Eyes: Conjunctivae are normal. Right eye exhibits no discharge. Left eye exhibits no discharge.  Neck: Normal range of motion. Neck supple.  Cardiovascular: Regular rhythm.  Tachycardia present.  Exam reveals  gallop.   No murmur heard. Pulmonary/Chest: Effort normal and breath sounds normal. No respiratory distress. He has no wheezes. He has no rales.  Abdominal: Soft. There is no tenderness.  Neurological: He is alert.  Skin: Skin is warm and dry.  Psychiatric: He has a normal mood and affect.  Nursing note and vitals reviewed.    ED Treatments / Results  Labs (all labs ordered are listed, but only abnormal results are displayed) Labs Reviewed  CBC WITH DIFFERENTIAL/PLATELET - Abnormal; Notable for the following:       Result Value   WBC 3.8 (*)    RBC 3.97 (*)    Hemoglobin 11.7 (*)    HCT 34.9 (*)    RDW 16.6 (*)    Platelets 88 (*)    Lymphs Abs 0.5 (*)    All other components within normal limits  BASIC METABOLIC PANEL - Abnormal; Notable for the following:    CO2 21 (*)    Calcium 8.7 (*)    All other components within normal limits  URINALYSIS, ROUTINE W REFLEX MICROSCOPIC (NOT AT [facility]) - Abnormal; Notable for the following:    APPearance HAZY (*)    Hgb urine dipstick SMALL (*)    Leukocytes, UA SMALL (*)    All other components within normal limits  URINE MICROSCOPIC-ADD ON - Abnormal; Notable for the following:    Squamous Epithelial / LPF 0-5 (*)    Bacteria, UA FEW (*)    All other components within normal limits  I-STAT CG4 LACTIC ACID, ED - Abnormal; Notable for the following:    Lactic Acid, Venous 3.46 (*)    All other components within normal limits  I-STAT CG4 LACTIC ACID, ED - Abnormal;  Notable for the following:    Lactic Acid, Venous 2.35 (*)    All other components within normal limits  I-STAT TROPOININ, ED    EKG  EKG Interpretation  Date/Time:  Wednesday August 12 2016 07:50:55 EST Ventricular Rate:  126 PR Interval:    QRS Duration: 139 QT Interval:  353 QTC Calculation: 512 R Axis:   -97 Text Interpretation:  Sinus tachycardia Right bundle branch block Confirmed by Christy Gentles  MD, DONALD ([id number]) on 08/12/2016 7:59:46 AM       Radiology Dg Chest 2 View  Result Date: 08/12/2016 CLINICAL DATA:  Status post fall. EXAM: CHEST  2 VIEW COMPARISON:  None. FINDINGS: The heart size and mediastinal contours are within normal limits. Both lungs are clear. The visualized skeletal structures are unremarkable. IMPRESSION: No active cardiopulmonary disease. Electronically Signed   By: Kathreen Devoid   On: 08/12/2016 08:02   Dg Thoracic Spine 2 View  Result Date: 08/12/2016 CLINICAL DATA:  Fall. EXAM: THORACIC SPINE 2 VIEWS COMPARISON:  08/06/2016.  07/20/2016 FINDINGS: Thoracic spine scoliosis with diffuse degenerative change. Stable mild upper thoracic vertebral body compressions. No acute abnormality identified. IMPRESSION: 1. Stable mild upper thoracic vertebral body compressions. No acute abnormality. 2.  Thoracic spine scoliosis with diffuse degenerative change. Electronically Signed   By: Marcello Moores  Register   On: 08/12/2016 08:02   Dg Lumbar Spine Complete  Result Date: 08/12/2016 CLINICAL DATA:  Status post fall.  Back pain, neck pain EXAM: LUMBAR SPINE - COMPLETE 4+ VIEW COMPARISON:  None. FINDINGS: There are 5 nonrib bearing lumbar-type vertebral bodies. There is mild L5 vertebral body height loss. There is generalized osteopenia. There is a dextrocurvature of the lumbar spine. The alignment is anatomic. There is no static listhesis. There is  no spondylolysis. There is degenerative disc disease with disc height loss at L1-2, L2-3, L3-4, L4-5. There is bilateral facet  arthropathy L3-4, L4-5 and L5-S1. There are mild degenerative changes of bilateral sacroiliac joints. There is abdominal aortic atherosclerosis. IMPRESSION: Mild L5 vertebral body height loss of indeterminate age. Lumbar spine spondylosis. Aortic Atherosclerosis (ICD10-170.0) Electronically Signed   By: Kathreen Devoid   On: 08/12/2016 08:01   Ct Head Wo Contrast  Result Date: 08/12/2016 CLINICAL DATA:  Status post fall out of bed last night. EXAM: CT HEAD WITHOUT CONTRAST CT CERVICAL SPINE WITHOUT CONTRAST TECHNIQUE: Multidetector CT imaging of the head and cervical spine was performed following the standard protocol without intravenous contrast. Multiplanar CT image reconstructions of the cervical spine were also generated. COMPARISON:  Head CT scan 06/25/2016.  Brain MRI 06/26/2016. 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 No evidence of acute infarct, hemorrhage, mass lesion, midline shift or abnormal extra-axial fluid collection is identified. No hydrocephalus or pneumocephalus. Vascular: Atherosclerotic vascular disease is identified. Skull: Intact. Sinuses/Orbits: Unremarkable. Other: None. CT CERVICAL SPINE FINDINGS Alignment: Normal. Skull base and vertebrae: No acute fracture. No primary bone lesion or focal pathologic process. Soft tissues and spinal canal: No prevertebral fluid or swelling. No visible canal hematoma. Disc levels: Intervertebral disc space height is maintained. Mild appearing bulge C3-4 noted. Upper chest: Negative. Other: None. IMPRESSION: No acute abnormality head or cervical spine. 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 Negative cervical spine CT. Electronically Signed   By: Inge Rise M.D.   On: 08/12/2016 07:52   Ct  Cervical Spine Wo Contrast  Result Date: 08/12/2016 CLINICAL DATA:  Status post fall out of bed last night. EXAM: CT HEAD WITHOUT CONTRAST CT CERVICAL SPINE WITHOUT CONTRAST TECHNIQUE: Multidetector CT imaging of the head and cervical spine was performed following the standard protocol without intravenous contrast. Multiplanar CT image reconstructions of the cervical spine were also generated. COMPARISON:  Head CT scan 06/25/2016.  Brain MRI 06/26/2016. 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 No evidence of acute infarct, hemorrhage, mass lesion, midline shift or abnormal extra-axial fluid collection is identified. No hydrocephalus or pneumocephalus. Vascular: Atherosclerotic vascular disease is identified. Skull: Intact. Sinuses/Orbits: Unremarkable. Other: None. CT CERVICAL SPINE FINDINGS Alignment: Normal. Skull base and vertebrae: No acute fracture. No primary bone lesion or focal pathologic process. Soft tissues and spinal canal: No prevertebral fluid or swelling. No visible canal hematoma. Disc levels: Intervertebral disc space height is maintained. Mild appearing bulge C3-4 noted. Upper chest: Negative. Other: None. IMPRESSION: No acute abnormality head or cervical spine. 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 Negative cervical spine CT. Electronically Signed   By: Inge Rise M.D.   On: 08/12/2016 07:52    Procedures Procedures (including critical care time)  Medications Ordered in ED Medications  sodium chloride 0.9 % bolus 500 mL (0 mLs Intravenous Stopped 08/12/16 0835)  acetaminophen (TYLENOL) tablet 650 mg (650 mg Oral Given 08/12/16 0746)  sodium chloride 0.9 % bolus 500 mL (0 mLs Intravenous Stopped 08/12/16 0939)  sodium  chloride 0.9 % bolus 500  mL (0 mLs Intravenous Stopped 08/12/16 1145)  cefTRIAXone (ROCEPHIN) 1 g in dextrose 5 % 50 mL IVPB (0 g Intravenous Stopped 08/12/16 1112)  metoprolol tartrate (LOPRESSOR) tablet 50 mg (50 mg Oral Given 08/12/16 1040)     Initial Impression / Assessment and Plan / ED Course  I have reviewed the triage vital signs and the nursing notes.  Pertinent labs & imaging results that were available during my care of the patient were reviewed by me and considered in my medical decision making (see chart for details).  Clinical Course    Patient seen and examined. Work-up initiated. Medications ordered. D/w Dr. Christy Gentles.   Vital signs reviewed and are as follows: BP (!) 87/65   Pulse (!) 130   Temp 97.8 F (36.6 C) (Oral)   Resp 20   Ht 5\' 11"  (1.803 m)   Wt 89.8 kg   SpO2 100%   BMI 27.62 kg/m   6:52 AM Bolus ordered to support BP prior to starting cardizem. Delay in obtaining IV, IV team successfully placed angiocath in L forearm. Pt to imaging.   7:04 AM Lactate noted to be 3.4. The patient is noted to have a lactate>2 With the current information available to me, I don't think the patient is in septic shock. The lactate>2, is related to OTHER SHOCK afib with RVR. This is very similar to presentation on 08/06/16.   8:18 AM Pt stable. Resting. HR 124 on monitor. Repeat EKG shows sinus tachycardia. Pt d/w and seen by Dr. Christy Gentles. Agrees no sepsis. Will hydrate to 1.5L and reassess. If improved and lactate improved, can likely go home.   9:56 AM HR continues to be elevated. Pt admits to not taking the medications that he was recently discharged home with -- including keflex and metoprolol.   D/w Dr. Johnney Killian. Will recheck lactic acid, orthostatics, give rocephin and his prescribed dose of metoprolol (SBP>90).   3:42 PM Patient monitored ED for several hours and [facility] is now here to take him home. Repeat EKG shows afib at a controlled rate. I asked the patient if he can  fill the cephalexin and metoprolol that was prescribed previous admission. He states yes.  There was initially concern over patient's ability to walk given the fact that he needed assistance with orthostatics. I asked the patient if he felt like he could walk and he stated that he would like to try. He was provided with a walker and managed to walk in the hallway unassisted at his baseline. Given this, feel patient is stable for discharge to home. He is received an additional dose of IV antibiotics in here to treat urinary tract infection. Encouraged PCP and cardiology follow-up as planned. Return to the emergency department with any worsening symptoms, shortness of breath, chest pain, lightheadedness, passing out, or any other concerns.  BP 105/76 (BP Location: Right Arm)   Pulse 79   Temp 97.8 F (36.6 C) (Oral)   Resp 20   Ht 5\' 11"  (1.803 m)   Wt 89.8 kg   SpO2 99%   BMI 27.62 kg/m    Final Clinical Impressions(s) / ED Diagnoses   Final diagnoses:  Fall, initial encounter  Acute cystitis without hematuria  Atrial fibrillation with RVR ([place])   Fall: Mechanical per patient. He was unsteady getting up to use the bedside commode. Imaging of his thoracic and lumbar spine is negative. Imaging of brain and cervical spine was also negative for acute injuries. Patient has been ambulatory at baseline.  Cystitis: Improved per UA, however not completely resolved. Patient did not fill the cephalexin prescribed to him at previous discharge. Patient is encouraged to fill and take this medication.  A. fib with RVR: Treated with fluids, oral metoprolol. Patient responded well and is now rate controlled in the 70s and 80s. Again, patient is encouraged to fill and take this medication.  Today's visit was very similar to ED visit on 11/2. Patient with tachycardia and elevated lactate. Lactate improved here with fluids. Patient was eventually discharged with medication for UTI and metoprolol for rate  control. Unfortunately, patient did not fill this medication. Treated as above today and improved to a point where I feel that he is safe for discharge to home. He is appropriate upcoming appointments and states that he can fill his medication.   New Prescriptions Current Discharge Medication List       Carlisle Cater, PA-C 08/12/16 [place], MD 08/12/16 [phone]

## 2016-08-14 ENCOUNTER — Ambulatory Visit: Payer: Self-pay | Admitting: Physician Assistant

## 2016-08-18 ENCOUNTER — Ambulatory Visit (HOSPITAL_COMMUNITY): Admission: RE | Admit: 2016-08-18 | Payer: Medicare Other | Source: Ambulatory Visit

## 2016-08-18 ENCOUNTER — Other Ambulatory Visit: Payer: Self-pay

## 2016-08-18 DIAGNOSIS — I5033 Acute on chronic diastolic (congestive) heart failure: Secondary | ICD-10-CM | POA: Diagnosis not present

## 2016-08-18 DIAGNOSIS — I639 Cerebral infarction, unspecified: Secondary | ICD-10-CM

## 2016-08-18 DIAGNOSIS — K7460 Unspecified cirrhosis of liver: Secondary | ICD-10-CM | POA: Diagnosis not present

## 2016-08-18 DIAGNOSIS — I69392 Facial weakness following cerebral infarction: Secondary | ICD-10-CM | POA: Diagnosis not present

## 2016-08-18 DIAGNOSIS — K219 Gastro-esophageal reflux disease without esophagitis: Secondary | ICD-10-CM | POA: Diagnosis not present

## 2016-08-18 DIAGNOSIS — D638 Anemia in other chronic diseases classified elsewhere: Secondary | ICD-10-CM | POA: Diagnosis not present

## 2016-08-18 DIAGNOSIS — I2510 Atherosclerotic heart disease of native coronary artery without angina pectoris: Secondary | ICD-10-CM | POA: Diagnosis not present

## 2016-08-18 DIAGNOSIS — I110 Hypertensive heart disease with heart failure: Secondary | ICD-10-CM | POA: Diagnosis not present

## 2016-08-18 DIAGNOSIS — I69354 Hemiplegia and hemiparesis following cerebral infarction affecting left non-dominant side: Secondary | ICD-10-CM | POA: Diagnosis not present

## 2016-08-18 DIAGNOSIS — I350 Nonrheumatic aortic (valve) stenosis: Secondary | ICD-10-CM | POA: Diagnosis not present

## 2016-08-18 DIAGNOSIS — I4891 Unspecified atrial fibrillation: Secondary | ICD-10-CM | POA: Diagnosis not present

## 2016-08-18 DIAGNOSIS — Z7982 Long term (current) use of aspirin: Secondary | ICD-10-CM | POA: Diagnosis not present

## 2016-08-19 ENCOUNTER — Encounter: Payer: Self-pay | Admitting: Physician Assistant

## 2016-08-19 DIAGNOSIS — I4891 Unspecified atrial fibrillation: Secondary | ICD-10-CM | POA: Diagnosis not present

## 2016-08-19 DIAGNOSIS — K7460 Unspecified cirrhosis of liver: Secondary | ICD-10-CM | POA: Diagnosis not present

## 2016-08-19 DIAGNOSIS — Z7982 Long term (current) use of aspirin: Secondary | ICD-10-CM | POA: Diagnosis not present

## 2016-08-19 DIAGNOSIS — I5033 Acute on chronic diastolic (congestive) heart failure: Secondary | ICD-10-CM | POA: Diagnosis not present

## 2016-08-19 DIAGNOSIS — I69354 Hemiplegia and hemiparesis following cerebral infarction affecting left non-dominant side: Secondary | ICD-10-CM | POA: Diagnosis not present

## 2016-08-19 DIAGNOSIS — D638 Anemia in other chronic diseases classified elsewhere: Secondary | ICD-10-CM | POA: Diagnosis not present

## 2016-08-19 DIAGNOSIS — K219 Gastro-esophageal reflux disease without esophagitis: Secondary | ICD-10-CM | POA: Diagnosis not present

## 2016-08-19 DIAGNOSIS — I2510 Atherosclerotic heart disease of native coronary artery without angina pectoris: Secondary | ICD-10-CM | POA: Diagnosis not present

## 2016-08-19 DIAGNOSIS — I350 Nonrheumatic aortic (valve) stenosis: Secondary | ICD-10-CM | POA: Diagnosis not present

## 2016-08-19 DIAGNOSIS — I69392 Facial weakness following cerebral infarction: Secondary | ICD-10-CM | POA: Diagnosis not present

## 2016-08-19 DIAGNOSIS — I110 Hypertensive heart disease with heart failure: Secondary | ICD-10-CM | POA: Diagnosis not present

## 2016-08-19 NOTE — Progress Notes (Addendum)
Cardiology Office Note    Date:  08/20/2016  ID:  Nathan Frank, DOB [birth year], MRN [id number] PCP:  Thressa Sheller, MD  Cardiologist:  Harrington Challenger   Chief Complaint: complicated follow-up  History of Present Illness:  Nathan Frank is a 75 y.o. male with history of NSTEMI (04/2015 possibly due to coronary spasm versus embolus or aborted infarct with lysis 04/2015), cardiomyopathy (EF variable, last 40-45%), SVT, paroxysmal atrial fib/flutter, bifascicular block, DVTs, HTN, hepatitis C, ?hepatocellular carcinoma, cirrhosis, GIB (rev r/t EtOH, NSAIDs, gastritis), alcohol abuse, heroin abuse, marijuana use, prolonged QT, anemia/thrombocytopenia, stroke 06/2016, medication noncompliance who presents for f/u.  He's been admitted multiple times lately with recognition of paroxysmal atrial fib/flutter. In his admissions in July 2017 he was not felt to be a candidate for anticoagulation given h/o GIB, ongoing alcohol abuse with cirrhosis and noncompliance. However, in September 2017 he suffered a stroke as well as imaging revealing chronic RLE DVT and age-indeterminant LLE DVT (with prior DVTs by imaging 2013). Given his large stroke he was not a candidate for anticoagulation at that time - it was recommended he have f/u imaging to determine clot burden and consider IVC filter if residual. He had a prolonged stay that carried over into CIR as well. He was readmitted 11/2-11/5 with atrial fibrillation vs flutter vs both in setting of UTI. The decision was made to place him on Eliquis at that time with cautious OP monitoring.  He was seen back in the ED 08/12/16 after he hurt his back after he fell trying to get out of bed. He also had AF RVR, hypotension (SBP 70s) and lactic acidosis in setting of not taking medication he was recently discharged with. Imaging was negative for acute injury. He was treated with IV hydration, rocephin, and metoprolol and discharged home. Last labs 08/12/16: K 3.5, Cr 0.98, WBC 3.8,  Hgb 11.7, Plt 88. Note prior INRs have ranged 1.2-1.7, albumin 2.2.  He returns for follow-up today with his neighbor. HR remains in the 120s and BP 90s/60s. He denies any CP, SOB, palpitations or awareness of his HR but says he generally feels "bad." He continues to have some back pain and also has left knee redness/mild edema. He has not been taking his Eliquis because he says he has to order it. He has not been taking spironolactone because he has no way to cut it in half. It's not really clear if he's actually taking any of his medicines. His neighbor reports that the patient's wife is really struggling at home to manage him.   Cardiac studies: - Most recent echo 06/16/16: posterior, septal and apical hypokinesis, EF [id number], normal diastolic parameters, mild MR. - Prior cath 04/15/15 showing widely patent coronaries. The right coronary, LAD, ramus, and circumflex are very tortuous. He had low normal to mildly depressed LV systolic function with an estimated EF of 45-50% with mildly elevated LVEDP. It was felt that his NSTEMI was possibly due to coronary spasm versus embolus, stress cardiomyopathy (atypical), and aborted infarction related to plaque rupture with thrombosis and dissolution.     Past Medical History:  Diagnosis Date  . Abnormal TSH   . Acute gastric ulcer   . Acute gastritis with hemorrhage   . Alcohol abuse   . Alcohol dependence ([place]) 02/02/2014  . Anemia   . Bifascicular block   . Cirrhosis ([place]) 06/04/2012  . Depressive disorder 02/01/2014  . Dizziness and giddiness 12/13/2014  . DVT of lower limb, acute ([place]) 06/06/2012  .  DVT, lower extremity, recurrent ([place])    a. noted 2013. b. also dx 06/2016.  Marland Kitchen Essential hypertension   . Fatty liver   . Gallstones   . Gastritis 06/07/2012  . GERD (gastroesophageal reflux disease)   . GI bleed due to NSAIDs 12/13/2014  . Granulomatous gastritis   . Hematuria 06/04/2012  . Hepatitis C   . Hepatocellular carcinoma ([place])   . Heroin  abuse    "I haven't done that since I don't know when."  . Heroin overdose 02/20/2014  . Neuropathy ([place])   . NICM (nonischemic cardiomyopathy) ([place])    a. 04/2015: EF 45-50% by cath. b. EF 40-45% by echo 06/2016.  . NSTEMI (non-ST elevated myocardial infarction) ([place])    a. 04/2015 - patent coronaries. Etiology possibly due to coronary spasm versus embolus, stress cardiomyopathy (atypical), and aborted infarction related to plaque rupture with thrombosis and dissolution. Amlodipine started. Not on antiplatelets due to GIB/cirrhosis history.  . Oral thrush 06/05/2012  . Polysubstance abuse    THC, alcohol, heroin  . Prolonged Q-T interval on ECG    a. 12/2014 - treated with magnesium.  . Right knee pain 12/13/2014  . S/P alcohol detoxification 02/02/2014  . Stroke ([place]) 06/2016  . SVT (supraventricular tachycardia) ([place])    a. 12/2014 in setting of GIB, ETOH, NSAIDS, gastritis.  . Symptomatic cholelithiasis 12/15/2013  . Thrombocytopenia ([place])   . Upper GI bleeding 12/13/2014  . Weight loss 06/04/2012    Past Surgical History:  Procedure Laterality Date  . CARDIAC CATHETERIZATION N/A 04/15/2015   Procedure: Left Heart Cath and Coronary Angiography;  Surgeon: Belva Crome, MD;  Location: [facility];  Service: Cardiovascular;  Laterality: N/A;  . CIRCUMCISION    . ESOPHAGOGASTRODUODENOSCOPY  06/07/2012   Procedure: ESOPHAGOGASTRODUODENOSCOPY (EGD);  Surgeon: Milus Banister, MD;  Location: [facility];  Service: Endoscopy;  Laterality: N/A;  may need treatment of varices  . ESOPHAGOGASTRODUODENOSCOPY N/A 12/14/2014   Procedure: ESOPHAGOGASTRODUODENOSCOPY (EGD);  Surgeon: Jerene Bears, MD;  [facility];  Service: Endoscopy;  Laterality: N/A;  . IR GENERIC HISTORICAL  06/25/2016   IR US GUIDE VASC ACCESS LEFT 06/25/2016 Corrie Mckusick, DO [facility]  . IR GENERIC HISTORICAL  06/25/2016   IR RADIOLOGY PERIPHERAL GUIDED IV START 06/25/2016 Corrie Mckusick, DO [facility]  . IR GENERIC  HISTORICAL  06/25/2016   IR PERCUTANEOUS ART THROMBECTOMY/INFUSION INTRACRANIAL INC DIAG ANGIO 06/25/2016 Luanne Bras, MD [facility]  . RADIOLOGY WITH ANESTHESIA N/A 06/25/2016   Procedure: RADIOLOGY WITH ANESTHESIA;  Surgeon: Luanne Bras, MD;  Location: [facility];  Service: Radiology;  Laterality: N/A;  . TONSILLECTOMY      Current Medications: Current Outpatient Prescriptions  Medication Sig Dispense Refill  . amitriptyline (ELAVIL) 10 MG tablet Take 1 tablet (10 mg total) by mouth at bedtime. 30 tablet 0  . apixaban (ELIQUIS) 5 MG TABS tablet Take 1 tablet (5 mg total) by mouth 2 (two) times daily. 60 tablet 0  . cephALEXin (KEFLEX) 500 MG capsule Take 1 capsule (500 mg total) by mouth 3 (three) times daily. 12 capsule 0  . Cholecalciferol (VITAMIN D PO) Take 1 tablet by mouth daily.    . folic acid (FOLVITE) 1 MG tablet Take 1 tablet (1 mg total) by mouth daily. 30 tablet 0  . levothyroxine (SYNTHROID, LEVOTHROID) 50 MCG tablet Take 1 tablet (50 mcg total) by mouth daily before breakfast. 30 tablet 0  . magnesium oxide (MAG-OX) 400 (241.3 Mg) MG tablet Take  1 tablet (400 mg total) by mouth daily. 30 tablet 0  . metoprolol (LOPRESSOR) 50 MG tablet Take 1 tablet (50 mg total) by mouth 3 (three) times daily. 90 tablet 0  . Multiple Vitamin (MULTIVITAMIN WITH MINERALS) TABS tablet Take 1 tablet by mouth daily. 30 tablet 0  . spironolactone (ALDACTONE) 25 MG tablet Take 0.5 tablets (12.5 mg total) by mouth daily. 15 tablet 0   No current facility-administered medications for this visit.      Allergies:   Patient has no known allergies.   Social History   Social History  . Marital status: Married    Spouse name: N/A  . Number of children: 3  . Years of education: N/A   Occupational History  . Retired Education officer, [employer]    Social History Main Topics  . Smoking status: Former Smoker    Packs/day: 0.50    Years: 5.00    Types: Cigarettes  . Smokeless tobacco: Never Used      Comment: "quit smoking cigarettes in the 1970's"  . Alcohol use 16.8 oz/week    28 Glasses of wine per week     Comment: 06/2016 " I quit drinking a couple of weeks ago "  . Drug use: No  . Sexual activity: Not Currently    Birth control/ protection: Condom   Other Topics Concern  . None   Social History Narrative   Lost one son to a gunshot.  Lives with wife.       Family History:  The patient's family history includes Heart disease in his mother; Prostate cancer in his brother; Stroke in his father.   ROS:   Please see the history of present illness.  All other systems are reviewed and otherwise negative.    PHYSICAL EXAM:   VS:  BP 90/60   Pulse (!) 127   Ht 5\' 11"  (1.803 m)   BMI: There is no height or weight on file to calculate BMI. - patient could not stand. GEN: Chronically ill appearing light skinned AAM, in no acute distress  HEENT: normocephalic, atraumatic Neck: no JVD, carotid bruits, or masses Cardiac: Tachycardic, regular with some irregularity; no murmurs, rubs, or gallops, no edema  Respiratory:  clear to auscultation bilaterally, normal work of breathing GI: soft, nontender, nondistended, + BS MS: no deformity or atrophy  Skin: warm and dry, no rash Neuro:  Alert and Oriented x 3, Strength and sensation are intact, follows commands Psych: euthymic mood, full affect  Wt Readings from Last 3 Encounters:  08/12/16 198 lb (89.8 kg)  08/09/16 198 lb (89.8 kg)  07/30/16 190 lb 6.4 oz (86.4 kg)      Studies/Labs Reviewed:   EKG:  EKG was ordered today and personally reviewed by me and demonstrates atrial flutter 127bpm  Recent Labs: 06/09/2016: B Natriuretic Peptide 463.2 08/06/2016: Magnesium 1.6; TSH 3.005 08/07/2016: ALT 22 08/12/2016: BUN 8; Creatinine, Ser 0.98; Hemoglobin 11.7; Platelets 88; Potassium 3.5; Sodium 141   Lipid Panel    Component Value Date/Time   CHOL 87 06/26/2016 0500   TRIG 91 06/27/2016 0219   HDL 26 (L) 06/26/2016 0500    CHOLHDL 3.3 06/26/2016 0500   VLDL 23 06/26/2016 0500   LDLCALC 38 06/26/2016 0500    Additional studies/ records that were reviewed today include: Summarized above.    ASSESSMENT & PLAN:   1. Paroxysmal atrial fib/flutter - persistent atrial flutter, with continued elevated rates and hypotension. Discussed patient extensively with Dr. Harrington Challenger who also evaluated  the patient. It is unclear if the atrial flutter is causing his hypotension or if there is another underlying issue here that is driving the whole process - he is extremely complex medically, so there could could be multiple factors at play including recent fall, knee injury, medication noncompliance, (?untreated) UTI, recent persistent lactic acidosis. Would recommend he proceed to the hospital for further management. We were informed there are presently no beds available with a 4-5 bed holding for each unit. Will plan to send to the ED to begin workup for hypotension. Would recommend internal medicine/possible critical care admission, and cardiology will follow along in consultation. Anticipate spironolactone (on board for liver disease) will need to be held due to his hypotension. Would continue beta blocker as HR allows. Diltiazem is less ideal choice given his cardiomyopathy. Cardioversion would be risky as he has not been compliant with anticoagulation. If CBC is stable, would recommend per pharmacy as we await the remainder of his workup. Ultimately may need to consider goals of care evaluation. At the very least he needs assisted living/SNF going home from the hospital as it is apparent he is unable to care for himself at home. 2. NICM - slight edema of thighs, no LEE of ankles. Follow. BP too low for diuresis. Probable contribution from hypoalbuminemia related to liver disease. 3. Bifasiscular block - no signs of bradycardia recently. 4. H/o recurrent DVTs - pulse ox WNL but may need to consider r/o for PE if workup otherwise  unrevealing. 5. Cirrhosis with thrombocytopenia - appreciate IM assistance.  Disposition: F/u TBD based on hospital stay.  Medication Adjustments/Labs and Tests Ordered: Current medicines are reviewed at length with the patient today.  Concerns regarding medicines are outlined above.   Raechel Ache PA-C  08/20/2016 11:34 AM    [address], [place], [place]  [address] Phone: [phone]; Fax: [phone]

## 2016-08-20 ENCOUNTER — Inpatient Hospital Stay (HOSPITAL_COMMUNITY)
Admission: EM | Admit: 2016-08-20 | Discharge: 2016-08-23 | DRG: 872 | Disposition: A | Discharge status: Skilled Nursing Facility | Payer: Medicare Other | Attending: Internal Medicine | Admitting: Internal Medicine

## 2016-08-20 ENCOUNTER — Ambulatory Visit (INDEPENDENT_AMBULATORY_CARE_PROVIDER_SITE_OTHER): Payer: Medicare Other | Admitting: Physician Assistant

## 2016-08-20 ENCOUNTER — Emergency Department (HOSPITAL_COMMUNITY): Payer: Medicare Other

## 2016-08-20 ENCOUNTER — Inpatient Hospital Stay (HOSPITAL_COMMUNITY): Payer: Medicare Other

## 2016-08-20 ENCOUNTER — Encounter: Payer: Self-pay | Admitting: Physician Assistant

## 2016-08-20 ENCOUNTER — Encounter (HOSPITAL_COMMUNITY): Payer: Self-pay | Admitting: Emergency Medicine

## 2016-08-20 ENCOUNTER — Ambulatory Visit: Payer: Self-pay | Admitting: Physician Assistant

## 2016-08-20 VITALS — BP 90/60 | HR 127 | Ht 71.0 in

## 2016-08-20 DIAGNOSIS — R627 Adult failure to thrive: Secondary | ICD-10-CM | POA: Diagnosis not present

## 2016-08-20 DIAGNOSIS — E039 Hypothyroidism, unspecified: Secondary | ICD-10-CM | POA: Diagnosis present

## 2016-08-20 DIAGNOSIS — S3992XA Unspecified injury of lower back, initial encounter: Secondary | ICD-10-CM | POA: Diagnosis not present

## 2016-08-20 DIAGNOSIS — I4892 Unspecified atrial flutter: Secondary | ICD-10-CM

## 2016-08-20 DIAGNOSIS — Z79899 Other long term (current) drug therapy: Secondary | ICD-10-CM

## 2016-08-20 DIAGNOSIS — Z9114 Patient's other noncompliance with medication regimen: Secondary | ICD-10-CM

## 2016-08-20 DIAGNOSIS — M7989 Other specified soft tissue disorders: Secondary | ICD-10-CM | POA: Diagnosis not present

## 2016-08-20 DIAGNOSIS — I252 Old myocardial infarction: Secondary | ICD-10-CM

## 2016-08-20 DIAGNOSIS — I959 Hypotension, unspecified: Secondary | ICD-10-CM | POA: Diagnosis not present

## 2016-08-20 DIAGNOSIS — Z823 Family history of stroke: Secondary | ICD-10-CM

## 2016-08-20 DIAGNOSIS — G9389 Other specified disorders of brain: Secondary | ICD-10-CM | POA: Diagnosis present

## 2016-08-20 DIAGNOSIS — Z9119 Patient's noncompliance with other medical treatment and regimen: Secondary | ICD-10-CM

## 2016-08-20 DIAGNOSIS — R1311 Dysphagia, oral phase: Secondary | ICD-10-CM | POA: Diagnosis not present

## 2016-08-20 DIAGNOSIS — K7030 Alcoholic cirrhosis of liver without ascites: Secondary | ICD-10-CM

## 2016-08-20 DIAGNOSIS — I480 Paroxysmal atrial fibrillation: Secondary | ICD-10-CM | POA: Diagnosis present

## 2016-08-20 DIAGNOSIS — K7031 Alcoholic cirrhosis of liver with ascites: Secondary | ICD-10-CM | POA: Diagnosis present

## 2016-08-20 DIAGNOSIS — K219 Gastro-esophageal reflux disease without esophagitis: Secondary | ICD-10-CM | POA: Diagnosis not present

## 2016-08-20 DIAGNOSIS — M25562 Pain in left knee: Secondary | ICD-10-CM | POA: Diagnosis not present

## 2016-08-20 DIAGNOSIS — Z8673 Personal history of transient ischemic attack (TIA), and cerebral infarction without residual deficits: Secondary | ICD-10-CM

## 2016-08-20 DIAGNOSIS — D696 Thrombocytopenia, unspecified: Secondary | ICD-10-CM | POA: Diagnosis not present

## 2016-08-20 DIAGNOSIS — I4891 Unspecified atrial fibrillation: Secondary | ICD-10-CM | POA: Diagnosis not present

## 2016-08-20 DIAGNOSIS — K760 Fatty (change of) liver, not elsewhere classified: Secondary | ICD-10-CM | POA: Diagnosis present

## 2016-08-20 DIAGNOSIS — Z993 Dependence on wheelchair: Secondary | ICD-10-CM

## 2016-08-20 DIAGNOSIS — W19XXXA Unspecified fall, initial encounter: Secondary | ICD-10-CM | POA: Diagnosis present

## 2016-08-20 DIAGNOSIS — I428 Other cardiomyopathies: Secondary | ICD-10-CM

## 2016-08-20 DIAGNOSIS — I5022 Chronic systolic (congestive) heart failure: Secondary | ICD-10-CM | POA: Diagnosis present

## 2016-08-20 DIAGNOSIS — A419 Sepsis, unspecified organism: Secondary | ICD-10-CM | POA: Diagnosis not present

## 2016-08-20 DIAGNOSIS — B1920 Unspecified viral hepatitis C without hepatic coma: Secondary | ICD-10-CM | POA: Diagnosis present

## 2016-08-20 DIAGNOSIS — F32A Depression, unspecified: Secondary | ICD-10-CM | POA: Diagnosis present

## 2016-08-20 DIAGNOSIS — N390 Urinary tract infection, site not specified: Secondary | ICD-10-CM | POA: Diagnosis not present

## 2016-08-20 DIAGNOSIS — F1010 Alcohol abuse, uncomplicated: Secondary | ICD-10-CM | POA: Diagnosis present

## 2016-08-20 DIAGNOSIS — L03116 Cellulitis of left lower limb: Secondary | ICD-10-CM | POA: Diagnosis not present

## 2016-08-20 DIAGNOSIS — I429 Cardiomyopathy, unspecified: Secondary | ICD-10-CM | POA: Diagnosis present

## 2016-08-20 DIAGNOSIS — F419 Anxiety disorder, unspecified: Secondary | ICD-10-CM | POA: Diagnosis present

## 2016-08-20 DIAGNOSIS — Z87891 Personal history of nicotine dependence: Secondary | ICD-10-CM

## 2016-08-20 DIAGNOSIS — I10 Essential (primary) hypertension: Secondary | ICD-10-CM | POA: Diagnosis not present

## 2016-08-20 DIAGNOSIS — I452 Bifascicular block: Secondary | ICD-10-CM | POA: Diagnosis not present

## 2016-08-20 DIAGNOSIS — Z8711 Personal history of peptic ulcer disease: Secondary | ICD-10-CM

## 2016-08-20 DIAGNOSIS — L03119 Cellulitis of unspecified part of limb: Secondary | ICD-10-CM | POA: Diagnosis not present

## 2016-08-20 DIAGNOSIS — Z8249 Family history of ischemic heart disease and other diseases of the circulatory system: Secondary | ICD-10-CM

## 2016-08-20 DIAGNOSIS — R278 Other lack of coordination: Secondary | ICD-10-CM | POA: Diagnosis not present

## 2016-08-20 DIAGNOSIS — D649 Anemia, unspecified: Secondary | ICD-10-CM | POA: Diagnosis not present

## 2016-08-20 DIAGNOSIS — I110 Hypertensive heart disease with heart failure: Secondary | ICD-10-CM | POA: Diagnosis not present

## 2016-08-20 DIAGNOSIS — L0390 Cellulitis, unspecified: Secondary | ICD-10-CM | POA: Insufficient documentation

## 2016-08-20 DIAGNOSIS — F329 Major depressive disorder, single episode, unspecified: Secondary | ICD-10-CM | POA: Diagnosis present

## 2016-08-20 DIAGNOSIS — R000 Tachycardia, unspecified: Secondary | ICD-10-CM | POA: Diagnosis not present

## 2016-08-20 DIAGNOSIS — F1011 Alcohol abuse, in remission: Secondary | ICD-10-CM | POA: Diagnosis present

## 2016-08-20 DIAGNOSIS — Z7901 Long term (current) use of anticoagulants: Secondary | ICD-10-CM

## 2016-08-20 DIAGNOSIS — I82501 Chronic embolism and thrombosis of unspecified deep veins of right lower extremity: Secondary | ICD-10-CM | POA: Diagnosis present

## 2016-08-20 DIAGNOSIS — R296 Repeated falls: Secondary | ICD-10-CM | POA: Diagnosis present

## 2016-08-20 DIAGNOSIS — E876 Hypokalemia: Secondary | ICD-10-CM | POA: Diagnosis not present

## 2016-08-20 DIAGNOSIS — M545 Low back pain: Secondary | ICD-10-CM | POA: Diagnosis not present

## 2016-08-20 DIAGNOSIS — M546 Pain in thoracic spine: Secondary | ICD-10-CM | POA: Diagnosis not present

## 2016-08-20 DIAGNOSIS — S299XXA Unspecified injury of thorax, initial encounter: Secondary | ICD-10-CM | POA: Diagnosis not present

## 2016-08-20 DIAGNOSIS — S0990XA Unspecified injury of head, initial encounter: Secondary | ICD-10-CM | POA: Diagnosis not present

## 2016-08-20 DIAGNOSIS — Z86718 Personal history of other venous thrombosis and embolism: Secondary | ICD-10-CM

## 2016-08-20 DIAGNOSIS — G319 Degenerative disease of nervous system, unspecified: Secondary | ICD-10-CM | POA: Diagnosis present

## 2016-08-20 DIAGNOSIS — R262 Difficulty in walking, not elsewhere classified: Secondary | ICD-10-CM | POA: Diagnosis not present

## 2016-08-20 DIAGNOSIS — Z8505 Personal history of malignant neoplasm of liver: Secondary | ICD-10-CM

## 2016-08-20 DIAGNOSIS — M6281 Muscle weakness (generalized): Secondary | ICD-10-CM | POA: Diagnosis not present

## 2016-08-20 LAB — URINE MICROSCOPIC-ADD ON

## 2016-08-20 LAB — RAPID URINE DRUG SCREEN, HOSP PERFORMED
AMPHETAMINES: NOT DETECTED
BARBITURATES: NOT DETECTED
Benzodiazepines: POSITIVE — AB
Cocaine: NOT DETECTED
OPIATES: NOT DETECTED
TETRAHYDROCANNABINOL: NOT DETECTED

## 2016-08-20 LAB — CBC WITH DIFFERENTIAL/PLATELET
Basophils Absolute: 0 10*3/uL (ref 0.0–0.1)
Basophils Relative: 1 %
EOS PCT: 1 %
Eosinophils Absolute: 0 10*3/uL (ref 0.0–0.7)
HCT: 38.4 % — ABNORMAL LOW (ref 39.0–52.0)
Hemoglobin: 12.7 g/dL — ABNORMAL LOW (ref 13.0–17.0)
LYMPHS ABS: 1.8 10*3/uL (ref 0.7–4.0)
LYMPHS PCT: 42 %
MCH: 29 pg (ref 26.0–34.0)
MCHC: 33.1 g/dL (ref 30.0–36.0)
MCV: 87.7 fL (ref 78.0–100.0)
MONO ABS: 0.8 10*3/uL (ref 0.1–1.0)
MONOS PCT: 19 %
Neutro Abs: 1.6 10*3/uL — ABNORMAL LOW (ref 1.7–7.7)
Neutrophils Relative %: 39 %
PLATELETS: 139 10*3/uL — AB (ref 150–400)
RBC: 4.38 MIL/uL (ref 4.22–5.81)
RDW: 18.1 % — AB (ref 11.5–15.5)
WBC: 4.2 10*3/uL (ref 4.0–10.5)

## 2016-08-20 LAB — APTT: aPTT: 29 seconds (ref 24–36)

## 2016-08-20 LAB — URINALYSIS, ROUTINE W REFLEX MICROSCOPIC
Glucose, UA: NEGATIVE mg/dL
Hgb urine dipstick: NEGATIVE
KETONES UR: 15 mg/dL — AB
NITRITE: NEGATIVE
PH: 6 (ref 5.0–8.0)
Protein, ur: 30 mg/dL — AB
Specific Gravity, Urine: 1.022 (ref 1.005–1.030)

## 2016-08-20 LAB — COMPREHENSIVE METABOLIC PANEL
ALBUMIN: 2.4 g/dL — AB (ref 3.5–5.0)
ALT: 17 U/L (ref 17–63)
AST: 41 U/L (ref 15–41)
Alkaline Phosphatase: 70 U/L (ref 38–126)
Anion gap: 10 (ref 5–15)
BILIRUBIN TOTAL: 1 mg/dL (ref 0.3–1.2)
BUN: 8 mg/dL (ref 6–20)
CHLORIDE: 111 mmol/L (ref 101–111)
CO2: 22 mmol/L (ref 22–32)
Calcium: 8.2 mg/dL — ABNORMAL LOW (ref 8.9–10.3)
Creatinine, Ser: 0.87 mg/dL (ref 0.61–1.24)
GFR calc Af Amer: >60 mL/min (ref >60)
GFR calc non Af Amer: >60 mL/min (ref >60)
GLUCOSE: 99 mg/dL (ref 65–99)
POTASSIUM: 3.9 mmol/L (ref 3.5–5.1)
Sodium: 143 mmol/L (ref 135–145)
TOTAL PROTEIN: 6.4 g/dL — AB (ref 6.5–8.1)

## 2016-08-20 LAB — SEDIMENTATION RATE: SED RATE: 7 mm/h (ref 0–16)

## 2016-08-20 LAB — LACTIC ACID, PLASMA: Lactic Acid, Venous: 1.6 mmol/L (ref 0.5–1.9)

## 2016-08-20 LAB — PROTIME-INR
INR: 1.36
PROTHROMBIN TIME: 16.8 s — AB (ref 11.4–15.2)

## 2016-08-20 LAB — BRAIN NATRIURETIC PEPTIDE: B NATRIURETIC PEPTIDE 5: 352.6 pg/mL — AB (ref 0.0–100.0)

## 2016-08-20 LAB — PROCALCITONIN

## 2016-08-20 LAB — C-REACTIVE PROTEIN: CRP: <0.8 mg/dL (ref <1.0)

## 2016-08-20 MED ORDER — LORAZEPAM 2 MG/ML IJ SOLN
0.0000 mg | Freq: Four times a day (QID) | INTRAMUSCULAR | Status: DC
  Administered 2016-08-20: 1 mg via INTRAVENOUS
  Filled 2016-08-20: qty 1

## 2016-08-20 MED ORDER — VITAMIN B-1 100 MG PO TABS
100.0000 mg | ORAL_TABLET | Freq: Every day | ORAL | Status: DC
  Administered 2016-08-21 – 2016-08-23 (×3): 100 mg via ORAL
  Filled 2016-08-20 (×3): qty 1

## 2016-08-20 MED ORDER — FOLIC ACID 1 MG PO TABS
1.0000 mg | ORAL_TABLET | Freq: Every day | ORAL | Status: DC
  Administered 2016-08-20 – 2016-08-23 (×4): 1 mg via ORAL
  Filled 2016-08-20 (×4): qty 1

## 2016-08-20 MED ORDER — HYDROXYZINE HCL 50 MG/ML IM SOLN
25.0000 mg | Freq: Four times a day (QID) | INTRAMUSCULAR | Status: DC | PRN
  Filled 2016-08-20: qty 0.5

## 2016-08-20 MED ORDER — SODIUM CHLORIDE 0.9 % IV BOLUS (SEPSIS)
500.0000 mL | Freq: Once | INTRAVENOUS | Status: AC
Start: 2016-08-20 — End: 2016-08-20
  Administered 2016-08-20: 500 mL via INTRAVENOUS

## 2016-08-20 MED ORDER — THIAMINE HCL 100 MG/ML IJ SOLN
100.0000 mg | Freq: Every day | INTRAMUSCULAR | Status: DC
  Administered 2016-08-20: 100 mg via INTRAVENOUS
  Filled 2016-08-20: qty 2

## 2016-08-20 MED ORDER — LORAZEPAM 2 MG/ML IJ SOLN: 0.0000 mg | Freq: Two times a day (BID) | INTRAMUSCULAR | Status: DC

## 2016-08-20 MED ORDER — SODIUM CHLORIDE 0.9 % IV SOLN
INTRAVENOUS | Status: DC
  Administered 2016-08-20 – 2016-08-23 (×5): via INTRAVENOUS

## 2016-08-20 MED ORDER — APIXABAN 5 MG PO TABS
5.0000 mg | ORAL_TABLET | Freq: Two times a day (BID) | ORAL | Status: DC
  Administered 2016-08-21 – 2016-08-23 (×6): 5 mg via ORAL
  Filled 2016-08-20 (×6): qty 1

## 2016-08-20 MED ORDER — VITAMIN D 1000 UNITS PO TABS
1000.0000 [IU] | ORAL_TABLET | Freq: Every day | ORAL | Status: DC
  Administered 2016-08-20 – 2016-08-23 (×4): 1000 [IU] via ORAL
  Filled 2016-08-20 (×4): qty 1

## 2016-08-20 MED ORDER — CEFAZOLIN IN D5W 1 GM/50ML IV SOLN
1.0000 g | Freq: Three times a day (TID) | INTRAVENOUS | Status: DC
  Administered 2016-08-21 – 2016-08-23 (×7): 1 g via INTRAVENOUS
  Filled 2016-08-20 (×10): qty 50

## 2016-08-20 MED ORDER — ZOLPIDEM TARTRATE 5 MG PO TABS
5.0000 mg | ORAL_TABLET | Freq: Every evening | ORAL | Status: DC | PRN
  Administered 2016-08-21 – 2016-08-22 (×3): 5 mg via ORAL
  Filled 2016-08-20 (×3): qty 1

## 2016-08-20 MED ORDER — METOPROLOL TARTRATE 50 MG PO TABS
50.0000 mg | ORAL_TABLET | Freq: Three times a day (TID) | ORAL | Status: DC
  Administered 2016-08-20 – 2016-08-23 (×7): 50 mg via ORAL
  Filled 2016-08-20 (×5): qty 1
  Filled 2016-08-20: qty 2
  Filled 2016-08-20 (×2): qty 1

## 2016-08-20 MED ORDER — LEVOTHYROXINE SODIUM 50 MCG PO TABS
50.0000 ug | ORAL_TABLET | Freq: Every day | ORAL | Status: DC
  Administered 2016-08-21 – 2016-08-23 (×3): 50 ug via ORAL
  Filled 2016-08-20 (×4): qty 1

## 2016-08-20 MED ORDER — CEFAZOLIN IN D5W 1 GM/50ML IV SOLN
1.0000 g | Freq: Once | INTRAVENOUS | Status: AC
  Administered 2016-08-20: 1 g via INTRAVENOUS
  Filled 2016-08-20: qty 50

## 2016-08-20 MED ORDER — LORAZEPAM 2 MG/ML IJ SOLN: 1.0000 mg | Freq: Four times a day (QID) | INTRAMUSCULAR | Status: DC | PRN

## 2016-08-20 MED ORDER — AMITRIPTYLINE HCL 10 MG PO TABS
10.0000 mg | ORAL_TABLET | Freq: Every day | ORAL | Status: DC
  Administered 2016-08-21 – 2016-08-22 (×3): 10 mg via ORAL
  Filled 2016-08-20 (×4): qty 1

## 2016-08-20 MED ORDER — MAGNESIUM OXIDE 400 (241.3 MG) MG PO TABS
400.0000 mg | ORAL_TABLET | Freq: Every day | ORAL | Status: DC
  Administered 2016-08-21 – 2016-08-23 (×4): 400 mg via ORAL
  Filled 2016-08-20 (×4): qty 1

## 2016-08-20 MED ORDER — OXYCODONE HCL 5 MG PO TABS
5.0000 mg | ORAL_TABLET | ORAL | Status: DC | PRN
  Administered 2016-08-20 – 2016-08-23 (×8): 5 mg via ORAL
  Filled 2016-08-20 (×8): qty 1

## 2016-08-20 MED ORDER — LORAZEPAM 1 MG PO TABS: 1.0000 mg | ORAL_TABLET | Freq: Four times a day (QID) | ORAL | Status: DC | PRN

## 2016-08-20 MED ORDER — VANCOMYCIN HCL IN DEXTROSE 1-5 GM/200ML-% IV SOLN
1000.0000 mg | Freq: Two times a day (BID) | INTRAVENOUS | Status: DC
  Administered 2016-08-21 – 2016-08-23 (×5): 1000 mg via INTRAVENOUS
  Filled 2016-08-20 (×6): qty 200

## 2016-08-20 MED ORDER — ALBUMIN HUMAN 25 % IV SOLN: 50.0000 g | Freq: Once | INTRAVENOUS | Status: DC

## 2016-08-20 MED ORDER — ADULT MULTIVITAMIN W/MINERALS CH
1.0000 | ORAL_TABLET | Freq: Every day | ORAL | Status: DC
  Administered 2016-08-20 – 2016-08-23 (×4): 1 via ORAL
  Filled 2016-08-20 (×4): qty 1

## 2016-08-20 MED ORDER — VANCOMYCIN HCL IN DEXTROSE 1-5 GM/200ML-% IV SOLN
1000.0000 mg | Freq: Once | INTRAVENOUS | Status: AC
  Administered 2016-08-20: 1000 mg via INTRAVENOUS
  Filled 2016-08-20: qty 200

## 2016-08-20 MED ORDER — METOPROLOL TARTRATE 25 MG PO TABS: 50.0000 mg | ORAL_TABLET | Freq: Once | ORAL | Status: DC

## 2016-08-20 NOTE — Progress Notes (Signed)
ANTIBIOTIC CONSULT NOTE - INITIAL  Pharmacy Consult for Vanco/Ancef Indication: cellulitis  No Known Allergies  Patient Measurements: Height: 5\' 11"  (180.3 cm) Weight: 198 lb (89.8 kg) IBW/kg (Calculated) : 75.3 Adjusted Body Weight:   Vital Signs: Temp: 97.6 F (36.4 C) (11/16 1253) Temp Source: Oral (11/16 1253) BP: 111/86 (11/16 1930) Pulse Rate: 88 (11/16 1930) Intake/Output from previous day: No intake/output data recorded. Intake/Output from this shift: No intake/output data recorded.  Labs:  Recent Labs  08/20/16 1517  WBC 4.2  HGB 12.7*  PLT 139*  CREATININE 0.87   Estimated Creatinine Clearance: 78.1 mL/min (by C-G formula based on SCr of 0.87 mg/dL). No results for input(s): VANCOTROUGH, VANCOPEAK, VANCORANDOM, GENTTROUGH, GENTPEAK, GENTRANDOM, TOBRATROUGH, TOBRAPEAK, TOBRARND, AMIKACINPEAK, AMIKACINTROU, AMIKACIN in the last 72 hours.   Microbiology: Recent Results (from the past 720 hour(s))  Culture, Urine     Status: Abnormal   Collection Time: 08/06/16  4:10 PM  Result Value Ref Range Status   Specimen Description URINE, RANDOM  Final   Special Requests NONE  Final   Culture >=100,000 COLONIES/mL KLEBSIELLA OXYTOCA (A)  Final   Report Status 08/09/2016 FINAL  Final   Organism ID, Bacteria KLEBSIELLA OXYTOCA (A)  Final      Susceptibility   Klebsiella oxytoca - MIC*    AMPICILLIN >=32 RESISTANT Resistant     CEFAZOLIN 8 SENSITIVE Sensitive     CEFTRIAXONE <=1 SENSITIVE Sensitive     CIPROFLOXACIN <=0.25 SENSITIVE Sensitive     GENTAMICIN <=1 SENSITIVE Sensitive     IMIPENEM <=0.25 SENSITIVE Sensitive     NITROFURANTOIN <=16 SENSITIVE Sensitive     TRIMETH/SULFA <=20 SENSITIVE Sensitive     AMPICILLIN/SULBACTAM 16 INTERMEDIATE Intermediate     PIP/TAZO <=4 SENSITIVE Sensitive     Extended ESBL NEGATIVE Sensitive     * >=100,000 COLONIES/mL KLEBSIELLA OXYTOCA    Medical History: Past Medical History:  Diagnosis Date  . Abnormal TSH   .  Acute gastric ulcer   . Acute gastritis with hemorrhage   . Alcohol abuse   . Alcohol dependence ([place]) 02/02/2014  . Anemia   . Bifascicular block   . Cirrhosis ([place]) 06/04/2012  . Depressive disorder 02/01/2014  . Dizziness and giddiness 12/13/2014  . DVT of lower limb, acute ([place]) 06/06/2012  . DVT, lower extremity, recurrent ([place])    a. noted 2013. b. also dx 06/2016.  Marland Kitchen Essential hypertension   . Fatty liver   . Gallstones   . Gastritis 06/07/2012  . GERD (gastroesophageal reflux disease)   . GI bleed due to NSAIDs 12/13/2014  . Granulomatous gastritis   . Hematuria 06/04/2012  . Hepatitis C   . Hepatocellular carcinoma ([place])   . Heroin abuse    "I haven't done that since I don't know when."  . Heroin overdose 02/20/2014  . Neuropathy ([place])   . NICM (nonischemic cardiomyopathy) ([place])    a. 04/2015: EF 45-50% by cath. b. EF 40-45% by echo 06/2016.  . NSTEMI (non-ST elevated myocardial infarction) ([place])    a. 04/2015 - patent coronaries. Etiology possibly due to coronary spasm versus embolus, stress cardiomyopathy (atypical), and aborted infarction related to plaque rupture with thrombosis and dissolution. Amlodipine started. Not on antiplatelets due to GIB/cirrhosis history.  . Oral thrush 06/05/2012  . Polysubstance abuse    THC, alcohol, heroin  . Prolonged Q-T interval on ECG    a. 12/2014 - treated with magnesium.  . Right knee pain 12/13/2014  . S/P alcohol detoxification 02/02/2014  .  Stroke ([place]) 06/2016  . SVT (supraventricular tachycardia) ([place])    a. 12/2014 in setting of GIB, ETOH, NSAIDS, gastritis.  . Symptomatic cholelithiasis 12/15/2013  . Thrombocytopenia ([place])   . Upper GI bleeding 12/13/2014  . Weight loss 06/04/2012    Medications:  F/u med rec  Assessment: Afib, UTI, thrombocytopenia  75 y/o M presents from MD office with tachycardia and hypotension, dysuria, decreased UOP, L knee pain and redness,  Medically noncompliant, h/o multiple falls.  Infectious Disease:  UTI, cellulitis. Keflex PTA, likely noncompliant. WBC 4.2   Goal of Therapy:  Vancomycin trough level 10-15 mcg/ml  Plan:  Ancef 1g IV q 8 hrs Vancomycin 1g IV q12h. Vanco trough after 3-5 doses at steady state.  Deundra Bard S. Alford [place], PharmD, BCPS Clinical Staff Pharmacist Pager [phone]  Eilene Ghazi Stillinger 08/20/2016,8:09 PM

## 2016-08-20 NOTE — ED Provider Notes (Signed)
[facility] DEPT Provider Note   CSN: [id number] Arrival date & time: 08/20/16  1253     History   Chief Complaint Chief Complaint  Patient presents with  . Tachycardia  . Hypotension    HPI Nathan Frank is a 75 y.o. male with multiple medical problems who was sent by Cardiology due to hypotension and tachycardia. He has a complex medical history as listed below. He was discharged from the hospital on 11/5 for A. Fib with RVR. His blood pressure and heart rate were managed during this admission, he was started on Eliquis, and he was recommended to follow up with PCP and cardiology. He was also found to have a UTI at that time and was prescribed Keflex. It is unclear if he has been compliant with his medicines and he states himself that he doesn't take them like he should.   He had a mechanical fall on 11/8, and was found to be hypotensive and tachycardic again. He was complaining of back pain and had xrays which showed degenerative changes but not acute pathology. He also had lactic acidosis which improved with fluids. He was screened and discharged. He was sent home with a walker due to concerns of poor balance. He does have 2 home health nurses who help him take his medicines and provide PT.   Three days ago he had another mechanical fall when his walker got stuck and he fell forward on to his L knee. He has had persistent pain and has been unable to ambulate since then and using a wheelchair. He went to Cardiology clinic today for his follow up appointment. He was tachycardic in to the 120s and was hypotensive with readings of 90/60. Due to concerns of the patient being persistently tachycardic and hypotensive, with multiple falls and now unable to ambulate, and medical non-compliance even with the help of home health, they sent him to the ED. He was supposed to be restarted on Eliquis due to stroke and PAF however he has not been able to get this medicine yet. He lives at home with  his wife who also has chronic medical problems and is unable to care for him.   Currently he reports ongoing back pain from his fall a week ago as well as L knee pain and redness. He also states he has had some burning with urination, inability to urinate, and decreased PO intake for the past week. He denies fever, chills, syncope, diaphoresis, HA, neck pain, chest pain, SOB, abdominal pain, N/V.   HPI  Past Medical History:  Diagnosis Date  . Abnormal TSH   . Acute gastric ulcer   . Acute gastritis with hemorrhage   . Alcohol abuse   . Alcohol dependence ([place]) 02/02/2014  . Anemia   . Bifascicular block   . Cirrhosis ([place]) 06/04/2012  . Depressive disorder 02/01/2014  . Dizziness and giddiness 12/13/2014  . DVT of lower limb, acute ([place]) 06/06/2012  . DVT, lower extremity, recurrent ([place])    a. noted 2013. b. also dx 06/2016.  Marland Kitchen Essential hypertension   . Fatty liver   . Gallstones   . Gastritis 06/07/2012  . GERD (gastroesophageal reflux disease)   . GI bleed due to NSAIDs 12/13/2014  . Granulomatous gastritis   . Hematuria 06/04/2012  . Hepatitis C   . Hepatocellular carcinoma ([place])   . Heroin abuse    "I haven't done that since I don't know when."  . Heroin overdose 02/20/2014  . Neuropathy ([place])   .  NICM (nonischemic cardiomyopathy) ([place])    a. 04/2015: EF 45-50% by cath. b. EF 40-45% by echo 06/2016.  . NSTEMI (non-ST elevated myocardial infarction) ([place])    a. 04/2015 - patent coronaries. Etiology possibly due to coronary spasm versus embolus, stress cardiomyopathy (atypical), and aborted infarction related to plaque rupture with thrombosis and dissolution. Amlodipine started. Not on antiplatelets due to GIB/cirrhosis history.  . Oral thrush 06/05/2012  . Polysubstance abuse    THC, alcohol, heroin  . Prolonged Q-T interval on ECG    a. 12/2014 - treated with magnesium.  . Right knee pain 12/13/2014  . S/P alcohol detoxification 02/02/2014  . Stroke ([place]) 06/2016  . SVT  (supraventricular tachycardia) ([place])    a. 12/2014 in setting of GIB, ETOH, NSAIDS, gastritis.  . Symptomatic cholelithiasis 12/15/2013  . Thrombocytopenia ([place])   . Upper GI bleeding 12/13/2014  . Weight loss 06/04/2012    Patient Active Problem List   Diagnosis Date Noted  . A-fib ([place]) 08/06/2016  . Atrial fibrillation with rapid ventricular response ([place]) 08/06/2016  . UTI (urinary tract infection) 08/06/2016  . Acute blood loss anemia   . Lymphocytosis   . Atrial tachycardia ([place])   . Left hemiparesis ([place]) 07/04/2016  . Acute ischemic right MCA stroke ([place]) 07/04/2016  . Obesity 07/01/2016  . Family hx-stroke 07/01/2016  . Hyperglycemia 07/01/2016  . Wound of left leg 07/01/2016  . Dysphagia, post-stroke   . Dysarthria, post-stroke   . Pyrexia   . Bleeding gastrointestinal   . Polysubstance abuse   . Chronic combined systolic and diastolic CHF (congestive heart failure) ([place])   . Mitral valve regurgitation   . Tachypnea   . Anemia of chronic disease   . Leukocytosis   . 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 Hypomagnesemia 06/09/2016  . Acute on chronic diastolic CHF (congestive heart failure) ([place]) 06/09/2016  . Alcoholic cirrhosis of liver with ascites ([place])   . Other specified hypothyroidism   . Hepatocellular carcinoma ([place])   . Alcohol withdrawal ([place]) 05/27/2016  . Generalized weakness 05/27/2016  . Hypokalemia 05/27/2016  . Dehydration 05/27/2016  . Diarrhea 04/28/2016  . SIRS (systemic inflammatory response syndrome) ([place]) 04/19/2016  . Alcohol use disorder, moderate, in controlled environment, dependence ([place])   . Lactic acidosis   . Tachycardia   . Atrial fibrillation with RVR ([place]) 04/18/2016  . Chest pain 04/18/2016  . Hypotension 04/18/2016  . Elevated lactic acid level 04/18/2016  . Chronic atrial fibrillation ([place]) 04/18/2016  . SVT (supraventricular tachycardia) ([place])     . Prolonged Q-T interval on ECG   . Neuropathy ([place])   . Essential hypertension   . Thrombocytopenia ([place]) 04/16/2015  . Abnormal TSH 04/16/2015  . NSTEMI (non-ST elevated myocardial infarction) ([place])   . Acute gastric ulcer   . Acute gastritis with hemorrhage   . GI bleed due to NSAIDs 12/13/2014  . Right knee pain 12/13/2014  . Dizziness and giddiness 12/13/2014  . Upper GI bleeding 12/13/2014  . Heroin overdose 02/20/2014  . S/P alcohol detoxification 02/02/2014  . Alcohol dependence ([place]) 02/02/2014  . Depressive disorder 02/01/2014  . Symptomatic cholelithiasis 12/15/2013  . Abdominal pain 12/14/2013  . Gastritis 06/07/2012  . DVT of lower limb, acute ([place]) 06/06/2012  . Oral thrush 06/05/2012  . Alcohol abuse 06/04/2012  . Weight loss 06/04/2012  . 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 Liver cirrhosis ([place]) 06/04/2012  .  Hematuria 06/04/2012    Past Surgical History:  Procedure Laterality Date  . CARDIAC CATHETERIZATION N/A 04/15/2015   Procedure: Left Heart Cath and Coronary Angiography;  Surgeon: Belva Crome, MD;  Location: [facility] CV LAB;  Service: Cardiovascular;  Laterality: N/A;  . CIRCUMCISION    . ESOPHAGOGASTRODUODENOSCOPY  06/07/2012   Procedure: ESOPHAGOGASTRODUODENOSCOPY (EGD);  Surgeon: Milus Banister, MD;  Location: [facility];  Service: Endoscopy;  Laterality: N/A;  may need treatment of varices  . ESOPHAGOGASTRODUODENOSCOPY N/A 12/14/2014   Procedure: ESOPHAGOGASTRODUODENOSCOPY (EGD);  Surgeon: Jerene Bears, MD;  Location[facility] ENDOSCOPY;  Service: Endoscopy;  Laterality: N/A;  . IR GENERIC HISTORICAL  06/25/2016   IR US GUIDE VASC ACCESS LEFT 06/25/2016 Corrie Mckusick, DO [facility]  . IR GENERIC HISTORICAL  06/25/2016   IR RADIOLOGY PERIPHERAL GUIDED IV START 06/25/2016 Corrie Mckusick, DO [facility]  . IR GENERIC HISTORICAL  06/25/2016   IR PERCUTANEOUS ART THROMBECTOMY/INFUSION INTRACRANIAL INC DIAG ANGIO 06/25/2016  Luanne Bras, MD [facility]  . RADIOLOGY WITH ANESTHESIA N/A 06/25/2016   Procedure: RADIOLOGY WITH ANESTHESIA;  Surgeon: Luanne Bras, MD;  Location: [facility];  Service: Radiology;  Laterality: N/A;  . TONSILLECTOMY         Home Medications    Prior to Admission medications   Medication Sig Start Date End Date Taking? Authorizing Provider  amitriptyline (ELAVIL) 10 MG tablet Take 1 tablet (10 mg total) by mouth at bedtime. 07/30/16   Bary Leriche, PA-C  apixaban (ELIQUIS) 5 MG TABS tablet Take 1 tablet (5 mg total) by mouth 2 (two) times daily. 08/09/16   Patrecia Pour, MD  cephALEXin (KEFLEX) 500 MG capsule Take 1 capsule (500 mg total) by mouth 3 (three) times daily. 08/09/16   Patrecia Pour, MD  Cholecalciferol (VITAMIN D PO) Take 1 tablet by mouth daily.    Historical Provider, MD  folic acid (FOLVITE) 1 MG tablet Take 1 tablet (1 mg total) by mouth daily. 04/21/16   Maryann Mikhail, DO  levothyroxine (SYNTHROID, LEVOTHROID) 50 MCG tablet Take 1 tablet (50 mcg total) by mouth daily before breakfast. 06/16/16   Florencia Reasons, MD  magnesium oxide (MAG-OX) 400 (241.3 Mg) MG tablet Take 1 tablet (400 mg total) by mouth daily. 06/03/16   Verlee Monte, MD  metoprolol (LOPRESSOR) 50 MG tablet Take 1 tablet (50 mg total) by mouth 3 (three) times daily. 07/30/16   Bary Leriche, PA-C  Multiple Vitamin (MULTIVITAMIN WITH MINERALS) TABS tablet Take 1 tablet by mouth daily. 04/21/16   Maryann Mikhail, DO  spironolactone (ALDACTONE) 25 MG tablet Take 0.5 tablets (12.5 mg total) by mouth daily. 07/30/16   Bary Leriche, PA-C    Family History Family History  Problem Relation Age of Onset  . Stroke Father   . Prostate cancer Brother   . Heart disease Mother     Pacemaker    Social History Social History  Substance Use Topics  . Smoking status: Former Smoker    Packs/day: 0.50    Years: 5.00    Types: Cigarettes  . Smokeless tobacco: Never Used     Comment: "quit smoking cigarettes in the  1970's"  . Alcohol use No     Comment: 06/2016 " I quit drinking a couple of weeks ago "     Allergies   Patient has no known allergies.   Review of Systems Review of Systems  Constitutional: Positive for activity change and appetite change. Negative for chills and fever.  Respiratory: Negative for shortness of breath.   Cardiovascular: Negative for chest pain.  Gastrointestinal: Negative for abdominal pain, nausea and vomiting.  Genitourinary: Positive for difficulty urinating and dysuria. Negative for enuresis, frequency, penile pain and testicular pain.  Musculoskeletal: Positive for arthralgias, back pain and gait problem. Negative for neck pain.  Skin: Negative for wound.  Neurological: Negative for dizziness, syncope, weakness, light-headedness, numbness and headaches.  All other systems reviewed and are negative.    Physical Exam Updated Vital Signs BP 113/88 (BP Location: Left Arm)   Pulse 113   Temp 97.6 F (36.4 C) (Oral)   Resp 20   Ht 5\' 11"  (1.803 m)   Wt 89.8 kg   SpO2 97%   BMI 27.62 kg/m   Physical Exam  Constitutional: He is oriented to person, place, and time. He appears well-developed and well-nourished. No distress.  HENT:  Head: Normocephalic and atraumatic.  Eyes: Conjunctivae are normal. Pupils are equal, round, and reactive to light. Right eye exhibits no discharge. Left eye exhibits no discharge. No scleral icterus.  Neck: Normal range of motion.  Cardiovascular: An irregularly irregular rhythm present. Tachycardia present.  Exam reveals no gallop and no friction rub.   No murmur heard. Pulmonary/Chest: Effort normal and breath sounds normal. No respiratory distress. He has no wheezes. He has no rales. He exhibits no tenderness.  Abdominal: Soft. Bowel sounds are normal. He exhibits no distension and no mass. There is no tenderness. There is no rebound and no guarding. No hernia.  Musculoskeletal:  Left knee: Mild swelling, warmth, and redness  of patella with tenderness to palpation. Decreased ROM due to pain.    Neurological: He is alert and oriented to person, place, and time.  Lying on stretcher in NAD. GCS 15. Speaks in a clear voice. Cranial nerves II through XII grossly intact. 5/5 strength in all extremities. Sensation fully intact.  Bilateral finger-to-nose intact.    Skin: Skin is warm and dry.  Psychiatric: He has a normal mood and affect. His behavior is normal.  Nursing note and vitals reviewed.    ED Treatments / Results  Labs (all labs ordered are listed, but only abnormal results are displayed) Labs Reviewed  URINALYSIS, ROUTINE W REFLEX MICROSCOPIC (NOT AT [facility])  COMPREHENSIVE METABOLIC PANEL  CBC WITH DIFFERENTIAL/PLATELET    EKG  EKG Interpretation  Date/Time:  Thursday August 20 2016 12:56:08 EST Ventricular Rate:  104 PR Interval:    QRS Duration: 130 QT Interval:  371 QTC Calculation: 488 R Axis:   113 Text Interpretation:  Atrial flutter Ventricular premature complex Right bundle branch block Confirmed by MESNER MD, Corene Cornea [phone]) on 08/20/2016 1:39:45 PM       Radiology No results found.  Procedures Procedures (including critical care time)  Medications Ordered in ED Medications  metoprolol tartrate (LOPRESSOR) tablet 50 mg (not administered)  sodium chloride 0.9 % bolus 500 mL (not administered)     Initial Impression / Assessment and Plan / ED Course  I have reviewed the triage vital signs and the nursing notes.  Pertinent labs & imaging results that were available during my care of the patient were reviewed by me and considered in my medical decision making (see chart for details).  Clinical Course    74 year old male with multiple medical problems, failure to thrive, multiple falls, and cellulitis. He is tachycardic and mildly hypotensive in the ED. Anticipate admission for this medically complex patient who is in need of a higher level of  care.   Labs pending. IVF and  dose of Metoprolol given. Patient signed out to A Meyer PA-C at shift change.   Final Clinical Impressions(s) / ED Diagnoses   Final diagnoses:  Tachycardia  Atrial fibrillation, unspecified type ([place])  Cellulitis of left lower extremity  Failure to thrive in adult  Fall    New Prescriptions New Prescriptions   No medications on file     Recardo Evangelist, PA-C 08/22/16 0935    Merrily Pew, MD 08/22/16 1116

## 2016-08-20 NOTE — ED Notes (Signed)
Patient transported to CT 

## 2016-08-20 NOTE — ED Triage Notes (Signed)
Pt to ED via [facility] from cardiology office, with c/o hypotension and tachycardia. Pt states that he fell on Monday onto left knee-- knee warm to touch, swollen. Pt normally walks with walker but got a wheelchair yesterday for home.  Hx hypotension, hx cirrhosis, no bleeding

## 2016-08-20 NOTE — ED Provider Notes (Signed)
4:22 PM: Care assumed from [facility], PA-C.   Nathan Frank is a 75 y.o. male with multiple medical problems sent to ED by cardiology due to hypotension and tachycardia. Pt recently d/c'd from hospital on 11/5 due to a.fib with RVR - rate controlled and started on Eliquis. UTI - started on keflex. Non-compliant with medications. Seen 11/8 for mechanical fall with back pain; noted to be hypotensive and tachycardic again, no acute pathology on x-rays, screened and d/c'd - sent home with walker due to balance. He also has 2 home health nurses and PT. Three days ago had another mechanical fall. He has since been wheelchair bound.   Seen by cardiologist today and found to be tachycardic 120s and hypotensive 90/60. Given multiple falls, persistent tachycardia and hypotension, medication non-compliance, and inability to ambulate recommend ED eval. Pt complains of ongoing back pain, left knee pain and redness,  Dysuria, decreased urine output. At sign out labs pending. Concern for FTT, medication non-compliance, and cellulitis of left knee - will likely need admission.   CMP grossly nml, U/A remarkable for leukocytes, crystals, and ketones. No leukocytosis. On my evaluation heart tachycardic at 100, irregularly irregular. Lungs are CTABL. Erythema, warmth, and TTP of left knee appreciated. Multiple scabs present on left knee as well. Pain with ROM; however, appears to be more skin vs. Intra-articular. X-ray knee shows no joint effusion, dislocation, or fracture; however, subcutaneous swelling appreciated. Suspect cellulitis. IV ABX initiated. Tachycardia, hypotension, and decreased urine output may be secondary to ?dehydration from cellulitis. Will consult hospitalist for admission for cellulitis of left knee, failure to thrive, tachycardia.   7:27 PM: Spoke with Dr. Blaine Hamper, greatly appreciate time and input, agree to admit patient for cellulitis, tachycardia, and failure to thrive. Thank you for your continued  care of this patient.    Roxanna Mew, PA-C 08/20/16 [place], MD 08/20/16 [phone]

## 2016-08-20 NOTE — Patient Instructions (Addendum)
  You are being direct admitted to [facility].  Report to Admitting at the [facility]

## 2016-08-20 NOTE — ED Notes (Signed)
Bladder scan completed -- 210 cc noted

## 2016-08-20 NOTE — ED Provider Notes (Signed)
Medical screening examination/treatment/procedure(s) were conducted as a shared visit with non-physician practitioner(s) and myself.  I personally evaluated the patient during the encounter.  76 year old male sent from cardiologist for admission. Patient and neighbor and medical records show that the patient had worsening difficulty taking care of himself at home. His wife was in care of him but she has some hip issues and is not able to. He has fallen multiple times recently most recently a few days ago. He has a physical therapist and nursing and comes to the home however he's had persistent and worsening weakness over the last couple days. Last night he started developing some redness on his knee it got worse and with pain as well. We'll see her cardiologist today and had blood pressures of 90/60 with a heart rate in the 120s to sent here for further evaluation. He has multiple complaints as outlined by the APP note.  On exam the patient is disheveled and has overall weakness but no focal deficits. His left knee has erythema that is warm and tender to touch and worse with range of motion. This is over his patella. He does not seem to be intra-articular seems to more consistent with cellulitis. He has multiple wounds in this area as the likely point of entry. Concerned that the patient has worsening failure to thrive but also an acute cellulitis of his left knee which needs antibiotics. Secondary to his multiple issues and other findings he needs to be admitted to the medicine service and subsequently likely rehabilitation.   EKG Interpretation  Date/Time:  Thursday August 20 2016 12:56:08 EST Ventricular Rate:  104 PR Interval:    QRS Duration: 130 QT Interval:  371 QTC Calculation: 488 R Axis:   113 Text Interpretation:  Atrial flutter Ventricular premature complex Right bundle branch block Confirmed by [employer] MD, Corene Cornea [phone]) on 08/20/2016 1:39:45 PM         Merrily Pew, MD 08/20/16  2030

## 2016-08-20 NOTE — H&P (Addendum)
History and Physical    Nathan Frank OYD:[id number] DOB: 23-Aug-1941 DOA: 08/20/2016  Referring MD/NP/PA:   PCP: Thressa Sheller, MD   Patient coming from:  The patient is coming from home.  At baseline, pt is independent for most of ADL.   Chief Complaint: fall, injury to left knee and back.    HPI: Nathan Frank is a 75 y.o. male with medical history significant of alcohol abuse, heroine abuse, alcoholic liver cirrhosis, HCV, hypertension, GERD, hypothyroidism, depression, upper GI bleeding, thrombocytopenia, SVT, afib on Eliquis, DVT on Eliquis, stroke, sCHF with EF 40-45%, medication noncompliance, who presents with fall, injury to left knee, back.  Patient states that he has had multiple fall recently, last fall was on Monday. He injured his left knee joint and nid of his back. He has pain in left knee and mid of back. He states that his left knee becomes red and warm, but not swollen. The pain in left knee and back is constant, sharp, 7 out of 10, nonradiating. He does not have leg numbness or weakness. No incontinence of urine or bowel movement. No neck pain or headache. Patient has a mild dysuria, no burning on urination, but no frequency. He denies chest pain, shortness breath, cough. No nausea, vomiting, abdominal pain, diarrhea. No malignant change or hearing loss. Pt was seen in cardiologist office today. He was found to have tachycardia and hypotension, therefore was sent to ED.  ED Course: pt was found to have soft blood pressure 90/60, which improved her to 112/72 after 500 mL normal saline. WBC 4.4, electrolytes renal function okay, urinalysis with small moderate leukocytes, temperature normal, tachycardia with heart rate up to 127, oxygen saturation 94% on room air,  Review of Systems:   General: no fevers, chills, no changes in body weight, has poor appetite, has fatigue HEENT: no blurry vision, hearing changes or sore throat Respiratory: no dyspnea, coughing,  wheezing CV: no chest pain, no palpitations GI: no nausea, vomiting, abdominal pain, diarrhea, constipation GU: has dysuria, burning on urination, no increased urinary frequency, hematuria  Ext: no leg edema Neuro: no unilateral weakness, numbness, or tingling, no vision change or hearing loss Skin: no skin tear.  MSK: has tenderness to left knee and midline of mid-back.  Heme: No easy bruising.  Travel history: No recent long distant travel.  Allergy: No Known Allergies  Past Medical History:  Diagnosis Date  . Abnormal TSH   . Acute gastric ulcer   . Acute gastritis with hemorrhage   . Alcohol abuse   . Alcohol dependence ([place]) 02/02/2014  . Anemia   . Bifascicular block   . Cirrhosis ([place]) 06/04/2012  . Depressive disorder 02/01/2014  . Dizziness and giddiness 12/13/2014  . DVT of lower limb, acute ([place]) 06/06/2012  . DVT, lower extremity, recurrent ([place])    a. noted 2013. b. also dx 06/2016.  [place] Kitchen Essential hypertension   . Fatty liver   . Gallstones   . Gastritis 06/07/2012  . GERD (gastroesophageal reflux disease)   . GI bleed due to NSAIDs 12/13/2014  . Granulomatous gastritis   . Hematuria 06/04/2012  . Hepatitis C   . Hepatocellular carcinoma ([place])   . Heroin abuse    "I haven't done that since I don't know when."  . Heroin overdose 02/20/2014  . Neuropathy ([place])   . NICM (nonischemic cardiomyopathy) ([place])    a. 04/2015: EF 45-50% by cath. b. EF 40-45% by echo 06/2016.  . NSTEMI (non-ST elevated myocardial infarction) ([place])  a. 04/2015 - patent coronaries. Etiology possibly due to coronary spasm versus embolus, stress cardiomyopathy (atypical), and aborted infarction related to plaque rupture with thrombosis and dissolution. Amlodipine started. Not on antiplatelets due to GIB/cirrhosis history.  . Oral thrush 06/05/2012  . Polysubstance abuse    THC, alcohol, heroin  . Prolonged Q-T interval on ECG    a. 12/2014 - treated with magnesium.  . Right knee pain 12/13/2014  . S/P  alcohol detoxification 02/02/2014  . Stroke ([place]) 06/2016  . SVT (supraventricular tachycardia) ([place])    a. 12/2014 in setting of GIB, ETOH, NSAIDS, gastritis.  . Symptomatic cholelithiasis 12/15/2013  . Thrombocytopenia ([place])   . Upper GI bleeding 12/13/2014  . Weight loss 06/04/2012    Past Surgical History:  Procedure Laterality Date  . CARDIAC CATHETERIZATION N/A 04/15/2015   Procedure: Left Heart Cath and Coronary Angiography;  Surgeon: Belva Crome, MD;  Location: [facility];  Service: Cardiovascular;  Laterality: N/A;  . CIRCUMCISION    . ESOPHAGOGASTRODUODENOSCOPY  06/07/2012   Procedure: ESOPHAGOGASTRODUODENOSCOPY (EGD);  Surgeon: Milus Banister, MD;  Location: [facility];  Service: Endoscopy;  Laterality: N/A;  may need treatment of varices  . ESOPHAGOGASTRODUODENOSCOPY N/A 12/14/2014   Procedure: ESOPHAGOGASTRODUODENOSCOPY (EGD);  Surgeon: Jerene Bears, MD;  Location: [facility] ENDOSCOPY;  Service: Endoscopy;  Laterality: N/A;  . IR GENERIC HISTORICAL  06/25/2016   IR US GUIDE VASC ACCESS LEFT 06/25/2016 Corrie Mckusick, DO [facility]  . IR GENERIC HISTORICAL  06/25/2016   IR RADIOLOGY PERIPHERAL GUIDED IV START 06/25/2016 Corrie Mckusick, DO [facility]  . IR GENERIC HISTORICAL  06/25/2016   IR PERCUTANEOUS ART THROMBECTOMY/INFUSION INTRACRANIAL INC DIAG ANGIO 06/25/2016 Luanne Bras, MD [facility]  . RADIOLOGY WITH ANESTHESIA N/A 06/25/2016   Procedure: RADIOLOGY WITH ANESTHESIA;  Surgeon: Luanne Bras, MD;  Location: [facility];  Service: Radiology;  Laterality: N/A;  . TONSILLECTOMY      Social History:  reports that he has quit smoking. His smoking use included Cigarettes. He has a 2.50 pack-year smoking history. He has never used smokeless tobacco. He reports that he does not drink alcohol or use drugs.  Family History:  Family History  Problem Relation Age of Onset  . Stroke Father   . Prostate cancer Brother   . Heart disease Mother     Pacemaker     Prior to  Admission medications   Medication Sig Start Date End Date Taking? Authorizing Provider  amitriptyline (ELAVIL) 10 MG tablet Take 1 tablet (10 mg total) by mouth at bedtime. 07/30/16  Yes Ivan Anchors Love, PA-C  apixaban (ELIQUIS) 5 MG TABS tablet Take 1 tablet (5 mg total) by mouth 2 (two) times daily. 08/09/16  Yes Patrecia Pour, MD  cephALEXin (KEFLEX) 500 MG capsule Take 1 capsule (500 mg total) by mouth 3 (three) times daily. 08/09/16  Yes Patrecia Pour, MD  Cholecalciferol (VITAMIN D PO) Take 1 tablet by mouth daily.   Yes Historical Provider, MD  folic acid (FOLVITE) 1 MG tablet Take 1 tablet (1 mg total) by mouth daily. 04/21/16  Yes Maryann Mikhail, DO  levothyroxine (SYNTHROID, LEVOTHROID) 50 MCG tablet Take 1 tablet (50 mcg total) by mouth daily before breakfast. 06/16/16  Yes Florencia Reasons, MD  magnesium oxide (MAG-OX) 400 (241.3 Mg) MG tablet Take 1 tablet (400 mg total) by mouth daily. 06/03/16  Yes Verlee Monte, MD  metoprolol (LOPRESSOR) 50 MG tablet Take 1 tablet (50 mg total) by mouth 3 (three) times  daily. 07/30/16  Yes Ivan Anchors Love, PA-C  Multiple Vitamin (MULTIVITAMIN WITH MINERALS) TABS tablet Take 1 tablet by mouth daily. 04/21/16  Yes Maryann Mikhail, DO  spironolactone (ALDACTONE) 25 MG tablet Take 0.5 tablets (12.5 mg total) by mouth daily. 07/30/16  Yes Bary Leriche, PA-C    Physical Exam: Vitals:   08/20/16 2015 08/20/16 2030 08/20/16 2106 08/20/16 2106  BP: 117/89 114/86  116/80  Pulse: 93 98  88  Resp: _0 Temp:      TempSrc:      SpO2: 95% 97%  95%  Weight:      Height:       General: Not in acute distress HEENT:       Eyes: PERRL, EOMI, no scleral icterus.       ENT: No discharge from the ears and nose, no pharynx injection, no tonsillar enlargement.        Neck: No JVD, no bruit, no mass felt. Heme: No neck lymph node enlargement. Cardiac: S1/S2, Irregularly irregular rhythm, No murmurs, No gallops or rubs. Respiratory: No rales, wheezing, rhonchi or  rubs. GI: Soft, nondistended, nontender, no rebound pain, no organomegaly, BS present. GU: No hematuria Ext: No pitting leg edema bilaterally. 2+DP/PT pulse bilaterally. Musculoskeletal: has tenderness to left knee and midline of mid-back. The left knee is erythematous and warm to touch, but no joint effusion or swelling.  Skin: No rashes.  Neuro: Alert, oriented X3, cranial nerves II-XII grossly intact, moves all extremities normally. Psych: Patient is not psychotic, no suicidal or hemocidal ideation.  Labs on Admission: I have personally reviewed following labs and imaging studies  CBC:  Recent Labs Lab 08/20/16 1517  WBC 4.2  NEUTROABS 1.6*  HGB 12.7*  HCT 38.4*  MCV 87.7  PLT 161*   Basic Metabolic Panel:  Recent Labs Lab 08/20/16 1517  NA 143  K 3.9  CL 111  CO2 22  GLUCOSE 99  BUN 8  CREATININE 0.87  CALCIUM 8.2*   GFR: Estimated Creatinine Clearance: 78.1 mL/min (by C-G formula based on SCr of 0.87 mg/dL). Liver Function Tests:  Recent Labs Lab 08/20/16 1517  AST 41  ALT 17  ALKPHOS 70  BILITOT 1.0  PROT 6.4*  ALBUMIN 2.4*   No results for input(s): LIPASE, AMYLASE in the last 168 hours. No results for input(s): AMMONIA in the last 168 hours. Coagulation Profile:  Recent Labs Lab 08/20/16 2022  INR 1.36   Cardiac Enzymes: No results for input(s): CKTOTAL, CKMB, CKMBINDEX, TROPONINI in the last 168 hours. BNP (last 3 results) No results for input(s): PROBNP in the last 8760 hours. HbA1C: No results for input(s): HGBA1C in the last 72 hours. CBG: No results for input(s): GLUCAP in the last 168 hours. Lipid Profile: No results for input(s): CHOL, HDL, LDLCALC, TRIG, CHOLHDL, LDLDIRECT in the last 72 hours. Thyroid Function Tests: No results for input(s): TSH, T4TOTAL, FREET4, T3FREE, THYROIDAB in the last 72 hours. Anemia Panel: No results for input(s): VITAMINB12, FOLATE, FERRITIN, TIBC, IRON, RETICCTPCT in the last 72 hours. Urine  analysis:    Component Value Date/Time   COLORURINE ORANGE (A) 08/20/2016 1618   APPEARANCEUR CLEAR 08/20/2016 1618   LABSPEC 1.022 08/20/2016 1618   PHURINE 6.0 08/20/2016 1618   GLUCOSEU NEGATIVE 08/20/2016 1618   HGBUR NEGATIVE 08/20/2016 1618   BILIRUBINUR SMALL (A) 08/20/2016 1618   KETONESUR 15 (A) 08/20/2016 1618   PROTEINUR 30 (A) 08/20/2016 1618   UROBILINOGEN 2.0 (H) 12/13/2014 1256  NITRITE NEGATIVE 08/20/2016 1618   LEUKOCYTESUR SMALL (A) 08/20/2016 1618   Sepsis Labs: _0 (procalcitonin:4,lacticidven:4) )No results found for this or any previous visit (from the past 240 hour(s)).   Radiological Exams on Admission: Dg Thoracic Spine 2 View  Result Date: 08/20/2016 CLINICAL DATA:  Status post two falls, with lower back pain. Initial encounter. EXAM: THORACIC SPINE 2 VIEWS COMPARISON:  Chest radiograph performed 08/12/2016 FINDINGS: There is no evidence of fracture or subluxation. Vertebral bodies demonstrate normal height and alignment. Intervertebral disc spaces are preserved. The visualized portions of both lungs are clear. The mediastinum is unremarkable in appearance. IMPRESSION: No evidence of fracture or subluxation along the thoracic spine. Electronically Signed   By: Garald Balding M.D.   On: 08/20/2016 21:11   Dg Lumbar Spine 2-3 Views  Result Date: 08/20/2016 CLINICAL DATA:  Status post fall, with lower back pain. Initial encounter. EXAM: LUMBAR SPINE - 2-3 VIEW COMPARISON:  Lumbar spine radiographs performed 08/12/2016 FINDINGS: There is no evidence of fracture or subluxation. Vertebral bodies demonstrate normal height and alignment. Intervertebral disc spaces are preserved. Small anterior and posterior disc osteophyte complexes are noted along the lumbar spine. The visualized bowel gas pattern is unremarkable in appearance; air and stool are noted within the colon. The sacroiliac joints are within normal limits. Mild calcification is noted along the  abdominal aorta. IMPRESSION: 1. No evidence of fracture or subluxation along the lumbar spine. 2. Minimal degenerative change along the lumbar spine. 3. Mild aortic atherosclerosis noted. Electronically Signed   By: Garald Balding M.D.   On: 08/20/2016 21:11   Dg Knee Complete 4 Views Left  Result Date: 08/20/2016 CLINICAL DATA:  Left knee pain, swelling and increased warmth after falling onto the knee 3 days ago. EXAM: LEFT KNEE - COMPLETE 4+ VIEW COMPARISON:  None. FINDINGS: Mild diffuse subcutaneous edema. Diffuse osteopenia. No fracture, dislocation or effusion seen. Anterior patellar spurs are noted. IMPRESSION: No fracture or effusion. Diffuse osteopenia and mild diffuse subcutaneous edema. Electronically Signed   By: Claudie Revering M.D.   On: 08/20/2016 14:29     EKG: Independently reviewed. A flutter/Afib with, QTC 488, old bifascicular block.    Assessment/Plan Principal Problem:   Cellulitis of left knee Active Problems:   Alcohol abuse   Depressive disorder   Thrombocytopenia (HCC)   Essential hypertension   Atrial fibrillation with RVR (HCC)   Hypotension   Sepsis ([place])   Alcoholic cirrhosis of liver with ascites ([place])   Fall   Back injury   Chronic systolic CHF (congestive heart failure) (HCC)   Cellulitis of left knee and possible sepsis: Patient seems to have cellulitis in the left knee. He does not have joint swelling or effusion, less likely to have septic joints. Patient meets criteria for sepsis with hypotension and tachycardia. Lactic acid level is pending. Patient had hypotension in the cardiologist's office. His blood pressure is 90/60 on admission, which improved to 112/72. Currently hemodynamically stable.  -will admit to SDU (pt may need IV cardizem gtt if A fib with RVR is worse) -IV Ancef and vancomycin was started in the ED, will continue - PRN hydroxyzine for nausea, oxycodone for pain - Blood cultures x 2  - ESR and CRP - will get Procalcitonin and  trend lactic acid levels per sepsis protocol. - IVF: 500L of NS bolus in ED, followed by 75 cc/h - will give 50 g of albumin by IV  Addendum: his blood pressure is still soft -will give another 1L of  NS bolus and increased the rate to 125cc/h -start Midodrine 10 mg tid   Hx of DVT: on Eliquis, but pt is taking his meds consistently. Patient does not have chest pain oxygen saturation, less likely to have a PE. -Continue Eliquis  Atrial Fibrillation: CHA2DS2-VASc Score is 7, needs oral anticoagulation. Patient is on Eliquis at home. No has Afib with RVR, HR 127-->113 -continue Eliquis -continue metoprolol -If heart rate is not controlled, will start IV Cardizem drip.  Essential hypertension: now has soft blood pressure -hold spironolactone -Continue metoprolol with holding parameter  Thrombocytopenia: platelets 139, likely due to liver cirrhosis. no bleeding tendency. -Follow-up CBC  Fall and injury to left knee and mid-back: Patient has generalized deconditioning due to multiple comorbidities. No focal neurologic findings, less likely to have a new stroke. X-ray of left knee is negative for bony fracture. -will get CT-head -will get X-ray of T- and L-spine -check UDS -prn oxycodone for pain -pt/ot -will consult to CM and SW for possible SNF placement  lcohol abuse: -Did counseling about the importance of quitting drinking -CIWA protocol  Depression and anxiety: Stable, no suicidal or homicidal ideations. -Continue home medications: Amitriptyline  Alcoholic cirrhosis of liver with ascites: No AP, less likely to have SBP. -Hold spironolactone due to soft blood pressure  Chronic systolic congestive heart failure: 2-D echo 06/16/16 showed EF of 40-45 percent. No leg edema or JVD. Said it seems to be compensated. -Hold spironolactone -Continue metoprolol -Check BNP    Sepsis - Reassessment   Performed at:    4:12 AM   Last Vitals:    Blood pressure 108/74, pulse 77,  temperature 97 F (36.1 C), temperature source Axillary, resp. rate 16, height _0  (1.803 m), weight 89.8 kg (198 lb), SpO2 99 %.  Heart:                  Irregularly irreegular rhythm   Lungs:     Clear to auscultation  Capillary Refill:   <2 seconds    Peripheral Pulse (include location): Brachial pulse palpable   Skin (include color):               Normal      DVT ppx: on Eliquis Code Status: Full code Family Communication: None at bed side.  Disposition Plan:  Anticipate discharge back to previous SNF Consults called:  none Admission status: SDU/inpation       Date of Service 08/20/2016    Ivor Costa [facility] Pager [phone]  If 7PM-7AM, please contact night-coverage [email or web address] Password [email or web address] 08/20/2016, 9:50 PM

## 2016-08-21 ENCOUNTER — Other Ambulatory Visit: Payer: Self-pay | Admitting: *Deleted

## 2016-08-21 DIAGNOSIS — I4892 Unspecified atrial flutter: Secondary | ICD-10-CM | POA: Diagnosis not present

## 2016-08-21 DIAGNOSIS — N390 Urinary tract infection, site not specified: Secondary | ICD-10-CM | POA: Diagnosis not present

## 2016-08-21 DIAGNOSIS — K7031 Alcoholic cirrhosis of liver with ascites: Secondary | ICD-10-CM

## 2016-08-21 DIAGNOSIS — I452 Bifascicular block: Secondary | ICD-10-CM | POA: Diagnosis not present

## 2016-08-21 DIAGNOSIS — K760 Fatty (change of) liver, not elsewhere classified: Secondary | ICD-10-CM | POA: Diagnosis not present

## 2016-08-21 DIAGNOSIS — I4891 Unspecified atrial fibrillation: Secondary | ICD-10-CM | POA: Diagnosis not present

## 2016-08-21 DIAGNOSIS — I429 Cardiomyopathy, unspecified: Secondary | ICD-10-CM

## 2016-08-21 DIAGNOSIS — F1010 Alcohol abuse, uncomplicated: Secondary | ICD-10-CM | POA: Diagnosis not present

## 2016-08-21 DIAGNOSIS — L03116 Cellulitis of left lower limb: Secondary | ICD-10-CM | POA: Diagnosis not present

## 2016-08-21 DIAGNOSIS — A419 Sepsis, unspecified organism: Secondary | ICD-10-CM | POA: Diagnosis not present

## 2016-08-21 DIAGNOSIS — I9589 Other hypotension: Secondary | ICD-10-CM

## 2016-08-21 DIAGNOSIS — I5022 Chronic systolic (congestive) heart failure: Secondary | ICD-10-CM | POA: Diagnosis not present

## 2016-08-21 DIAGNOSIS — R627 Adult failure to thrive: Secondary | ICD-10-CM | POA: Diagnosis not present

## 2016-08-21 DIAGNOSIS — W19XXXD Unspecified fall, subsequent encounter: Secondary | ICD-10-CM

## 2016-08-21 DIAGNOSIS — E876 Hypokalemia: Secondary | ICD-10-CM

## 2016-08-21 DIAGNOSIS — D696 Thrombocytopenia, unspecified: Secondary | ICD-10-CM

## 2016-08-21 DIAGNOSIS — R000 Tachycardia, unspecified: Secondary | ICD-10-CM | POA: Diagnosis not present

## 2016-08-21 DIAGNOSIS — I480 Paroxysmal atrial fibrillation: Secondary | ICD-10-CM | POA: Diagnosis not present

## 2016-08-21 DIAGNOSIS — I959 Hypotension, unspecified: Secondary | ICD-10-CM | POA: Diagnosis not present

## 2016-08-21 DIAGNOSIS — Z9114 Patient's other noncompliance with medication regimen: Secondary | ICD-10-CM | POA: Diagnosis not present

## 2016-08-21 DIAGNOSIS — I110 Hypertensive heart disease with heart failure: Secondary | ICD-10-CM | POA: Diagnosis not present

## 2016-08-21 DIAGNOSIS — I82501 Chronic embolism and thrombosis of unspecified deep veins of right lower extremity: Secondary | ICD-10-CM | POA: Diagnosis not present

## 2016-08-21 LAB — CBC
HEMATOCRIT: 35.4 % — AB (ref 39.0–52.0)
Hemoglobin: 11.8 g/dL — ABNORMAL LOW (ref 13.0–17.0)
MCH: 29.6 pg (ref 26.0–34.0)
MCHC: 33.3 g/dL (ref 30.0–36.0)
MCV: 88.7 fL (ref 78.0–100.0)
Platelets: 122 10*3/uL — ABNORMAL LOW (ref 150–400)
RBC: 3.99 MIL/uL — AB (ref 4.22–5.81)
RDW: 19 % — ABNORMAL HIGH (ref 11.5–15.5)
WBC: 3.7 10*3/uL — AB (ref 4.0–10.5)

## 2016-08-21 LAB — BASIC METABOLIC PANEL
ANION GAP: 6 (ref 5–15)
BUN: 10 mg/dL (ref 6–20)
CHLORIDE: 114 mmol/L — AB (ref 101–111)
CO2: 22 mmol/L (ref 22–32)
Calcium: 7.4 mg/dL — ABNORMAL LOW (ref 8.9–10.3)
Creatinine, Ser: 0.79 mg/dL (ref 0.61–1.24)
GFR calc non Af Amer: >60 mL/min (ref >60)
Glucose, Bld: 96 mg/dL (ref 65–99)
POTASSIUM: 3.1 mmol/L — AB (ref 3.5–5.1)
SODIUM: 142 mmol/L (ref 135–145)

## 2016-08-21 LAB — LACTIC ACID, PLASMA: Lactic Acid, Venous: 1.7 mmol/L (ref 0.5–1.9)

## 2016-08-21 LAB — MRSA PCR SCREENING: MRSA BY PCR: NEGATIVE

## 2016-08-21 LAB — HIV ANTIBODY (ROUTINE TESTING W REFLEX): HIV Screen 4th Generation wRfx: NONREACTIVE

## 2016-08-21 MED ORDER — POTASSIUM CHLORIDE CRYS ER 20 MEQ PO TBCR
40.0000 meq | EXTENDED_RELEASE_TABLET | Freq: Once | ORAL | Status: AC
  Administered 2016-08-21: 40 meq via ORAL
  Filled 2016-08-21: qty 2

## 2016-08-21 MED ORDER — LORAZEPAM 2 MG/ML IJ SOLN: 0.0000 mg | Freq: Four times a day (QID) | INTRAMUSCULAR | Status: DC

## 2016-08-21 MED ORDER — LORAZEPAM 2 MG/ML IJ SOLN: 0.0000 mg | Freq: Two times a day (BID) | INTRAMUSCULAR | Status: DC

## 2016-08-21 MED ORDER — SODIUM CHLORIDE 0.9 % IV BOLUS (SEPSIS)
1000.0000 mL | Freq: Once | INTRAVENOUS | Status: AC
  Administered 2016-08-21: 1000 mL via INTRAVENOUS

## 2016-08-21 MED ORDER — MIDODRINE HCL 5 MG PO TABS
10.0000 mg | ORAL_TABLET | Freq: Three times a day (TID) | ORAL | Status: DC
  Administered 2016-08-21 – 2016-08-23 (×8): 10 mg via ORAL
  Filled 2016-08-21 (×8): qty 2

## 2016-08-21 NOTE — Progress Notes (Signed)
[facility]  Patient Status: Active (receiving services up to time of hospitalization)  [facility] is providing the following services: RN, PT, OT and MSW  ST was ordered but patient keeps refusing ST  If patient discharges after hours, please call [phone].   Nathan Frank 08/21/2016, 1:02 PM

## 2016-08-21 NOTE — ED Notes (Signed)
Attempted to call report

## 2016-08-21 NOTE — Evaluation (Signed)
Physical Therapy Evaluation Patient Details Name: Nathan Frank MRN: [id number] DOB: December 29, 1940 Today's Date: 08/21/2016   History of Present Illness  Pt is a 75 yo male admitted after a fall with back pain and L knee pain.  No fx noted in spine and L knee with cellulitis.  Pt with PMH significant for afib, CVA 06/2016, NSTEMI, drug overdose, Hep C, EF of 40%  Clinical Impression  Pt admitted with/for fall with back pain and L knee pain.  Pt currently limited functionally due to the problems listed. ( See problems list.)   Pt will benefit from PT to maximize function and safety in order to get ready for next venue listed below.      Follow Up Recommendations SNF;Supervision/Assistance - 24 hour    Equipment Recommendations  None recommended by PT    Recommendations for Other Services       Precautions / Restrictions Precautions Precautions: Fall Precaution Comments: watch HR  148 on eval Restrictions Weight Bearing Restrictions: No      Mobility  Bed Mobility Overal bed mobility: Needs Assistance;+2 for physical assistance Bed Mobility: Supine to Sit     Supine to sit: Mod assist;+2 for physical assistance     General bed mobility comments: Pt with posterior lean when first up in sitting but this did improve as he sat on EOB longer.  Transfers Overall transfer level: Needs assistance Equipment used: Rolling walker (2 wheeled)   Sit to Stand: Mod assist;+2 physical assistance Stand pivot transfers: Min assist;+2 physical assistance       General transfer comment: Verbal cues for hand placement.  Assist to power up to standing, and for balance during transition.  Ambulation/Gait Ambulation/Gait assistance: Min assist;+2 safety/equipment Ambulation Distance (Feet): 15 Feet Assistive device: Rolling walker (2 wheeled) Gait Pattern/deviations: Step-through pattern Gait velocity: slower Gait velocity interpretation: Below normal speed for age/gender General Gait  Details: pt with generally steady gait with solid use of the RW.  Slow with EHR increasing quickly up to 148 bpm  Stairs            Wheelchair Mobility    Modified Rankin (Stroke Patients Only)       Balance Overall balance assessment: Needs assistance Sitting-balance support: Bilateral upper extremity supported Sitting balance-Leahy Scale: Fair   Postural control: Posterior lean Standing balance support: Bilateral upper extremity supported Standing balance-Leahy Scale: Poor Standing balance comment: needs external support.                             Pertinent Vitals/Pain Pain Assessment: Faces Faces Pain Scale: Hurts even more Pain Location: back and L knee Pain Descriptors / Indicators: Aching;Sore Pain Intervention(s): Limited activity within patient's tolerance;Monitored during session    [place] expects to be discharged to:: Private residence Living Arrangements: Spouse/significant other Available Help at Discharge: Family;Available 24 hours/day Type of Home: House Home Access: Stairs to enter Entrance Stairs-Rails: Right;Left;Can reach both Entrance Stairs-Number of Steps: 2 Home Layout: One level Home Equipment: Cane - single point;Grab bars - tub/shower;Hand held shower head;Walker - 2 wheels;Tub bench Additional Comments: only has wife to assist.    Prior Function Level of Independence: Needs assistance   Gait / Transfers Assistance Needed: Uses RW for gait "since stroke" and wife is with him most of the time he walks.  ADL's / Homemaking Assistance Needed: assist with adls, meal prep, housekeeping.  Pt needs help getting into shower but then can  bathe, needs help walking to toilet but independent when sitting and requires assist with LE dressing.  Comments: Used SPC for mobility but recently has been using RW      Hand Dominance   Dominant Hand: Right    Extremity/Trunk Assessment               Lower  Extremity Assessment: LLE deficits/detail;RLE deficits/detail RLE Deficits / Details: functional LLE Deficits / Details: resistant to attempts to flex knee and otherwise pt did not initially attempt to voluntarily move his leg at all.  Cervical / Trunk Assessment: Kyphotic  Communication   Communication: No difficulties  Cognition Arousal/Alertness: Awake/alert Behavior During Therapy: WFL for tasks assessed/performed Overall Cognitive Status: History of cognitive impairments - at baseline                      General Comments      Exercises     Assessment/Plan    PT Assessment Patient needs continued PT services  PT Problem List Decreased strength;Decreased activity tolerance;Decreased balance;Decreased mobility;Decreased knowledge of use of DME;Cardiopulmonary status limiting activity          PT Treatment Interventions Gait training;Functional mobility training;Therapeutic activities;Balance training;Patient/family education;DME instruction    PT Goals (Current goals can be found in the Care Plan section)  Acute Rehab PT Goals Patient Stated Goal: to get better PT Goal Formulation: With patient Time For Goal Achievement: 09/04/16 Potential to Achieve Goals: Good    Frequency Min 3X/week   Barriers to discharge        Co-evaluation PT/OT/SLP Co-Evaluation/Treatment: Yes Reason for Co-Treatment: For patient/therapist safety PT goals addressed during session: Mobility/safety with mobility         End of Session Equipment Utilized During Treatment: Gait belt Activity Tolerance: Patient limited by fatigue Patient left: in chair;with call bell/phone within reach Nurse Communication: Mobility status         Time: [id number] PT Time Calculation (min) (ACUTE ONLY): 31 min   Charges:   PT Evaluation $PT Eval Moderate Complexity: 1 Procedure     PT G CodesTessie Fass [name] 08/21/2016, 4:46 PM 08/21/2016  Donnella Sham,  PT [phone]  (pager)

## 2016-08-21 NOTE — Progress Notes (Signed)
There has been no documentation of patient voiding this shift or night shift last night. Per patient, he has voided twice. Assisted patient to [facility] and he voided a moderate amount, but missed the commode and voided on the floor, so volume was not able to be measured. Post void residual 33mls. Dr. Rockne Menghini notified. No new orders. Stated to monitor for third spacing in patient's abdomen due to history of cirrhosis. Will inform oncoming RN.   Joellen Jersey, RN.

## 2016-08-21 NOTE — Patient Outreach (Signed)
[facility]) Care Management  08/21/2016  Nathan Frank May 22, 1941 [id number]   New referral received from hospital liaison as member has been noted to have multiple needs/concern for ability to care for self at home.  Upon receiving referral, noted that member was readmitted to hospital with a pending plan to discharge to rehab.  Hospital liaison notified.  Will continue to follow for needs upon discharge.  [name], [place], MSN [place] [phone]

## 2016-08-21 NOTE — Progress Notes (Signed)
Subjective: Breathing OK  No CP   Objective: Vitals:   08/21/16 0252 08/21/16 0500 08/21/16 0700 08/21/16 1214  BP: 108/74     Pulse: 77     Resp:      Temp: 97 F (36.1 C)  97.5 F (36.4 C) 97.6 F (36.4 C)  TempSrc: Axillary  Oral Oral  SpO2:      Weight:  198 lb 13.7 oz (90.2 kg)    Height:       Weight change:   Intake/Output Summary (Last 24 hours) at 08/21/16 1456 Last data filed at 08/21/16 0900  Gross per 24 hour  Intake             1315 ml  Output                0 ml  Net             1315 ml    General: Alert, awake, oriented x3, in no acute distress Neck:  JVP is normal Heart: Regular rate and rhythm, without murmurs, rubs, gallops.  Lungs: Clear to auscultation.  No rales or wheezes. Exemities: 1+ edema.   Neuro: Grossly intact, nonfocal.  Tele  Afib   90s   Lab Results: Results for orders placed or performed during the hospital encounter of 08/20/16 (from the past 24 hour(s))  Comprehensive metabolic panel     Status: Abnormal   Collection Time: 08/20/16  3:17 PM  Result Value Ref Range   Sodium 143 135 - 145 mmol/L   Potassium 3.9 3.5 - 5.1 mmol/L   Chloride 111 101 - 111 mmol/L   CO2 22 22 - 32 mmol/L   Glucose, Bld 99 65 - 99 mg/dL   BUN 8 6 - 20 mg/dL   Creatinine, Ser 0.87 0.61 - 1.24 mg/dL   Calcium 8.2 (L) 8.9 - 10.3 mg/dL   Total Protein 6.4 (L) 6.5 - 8.1 g/dL   Albumin 2.4 (L) 3.5 - 5.0 g/dL   AST 41 15 - 41 U/L   ALT 17 17 - 63 U/L   Alkaline Phosphatase 70 38 - 126 U/L   Total Bilirubin 1.0 0.3 - 1.2 mg/dL   GFR calc non Af Amer >60 >60 mL/min   GFR calc Af Amer >60 >60 mL/min   Anion gap 10 5 - 15  CBC with Differential     Status: Abnormal   Collection Time: 08/20/16  3:17 PM  Result Value Ref Range   WBC 4.2 4.0 - 10.5 K/uL   RBC 4.38 4.22 - 5.81 MIL/uL   Hemoglobin 12.7 (L) 13.0 - 17.0 g/dL   HCT 38.4 (L) 39.0 - 52.0 %   MCV 87.7 78.0 - 100.0 fL   MCH 29.0 26.0 - 34.0 pg   MCHC 33.1 30.0 - 36.0 g/dL   RDW 18.1 (H) 11.5  - 15.5 %   Platelets 139 (L) 150 - 400 K/uL   Neutrophils Relative % 39 %   Neutro Abs 1.6 (L) 1.7 - 7.7 K/uL   Lymphocytes Relative 42 %   Lymphs Abs 1.8 0.7 - 4.0 K/uL   Monocytes Relative 19 %   Monocytes Absolute 0.8 0.1 - 1.0 K/uL   Eosinophils Relative 1 %   Eosinophils Absolute 0.0 0.0 - 0.7 K/uL   Basophils Relative 1 %   Basophils Absolute 0.0 0.0 - 0.1 K/uL  Urinalysis, Routine w reflex microscopic     Status: Abnormal   Collection Time: 08/20/16  4:18 PM  Result Value Ref Range   Color, Urine ORANGE (A) YELLOW   APPearance CLEAR CLEAR   Specific Gravity, Urine 1.022 1.005 - 1.030   pH 6.0 5.0 - 8.0   Glucose, UA NEGATIVE NEGATIVE mg/dL   Hgb urine dipstick NEGATIVE NEGATIVE   Bilirubin Urine SMALL (A) NEGATIVE   Ketones, ur 15 (A) NEGATIVE mg/dL   Protein, ur 30 (A) NEGATIVE mg/dL   Nitrite NEGATIVE NEGATIVE   Leukocytes, UA SMALL (A) NEGATIVE  Urine microscopic-add on     Status: Abnormal   Collection Time: 08/20/16  4:18 PM  Result Value Ref Range   Squamous Epithelial / LPF 0-5 (A) NONE SEEN   WBC, UA 0-5 0 - 5 WBC/hpf   RBC / HPF 0-5 0 - 5 RBC/hpf   Bacteria, UA RARE (A) NONE SEEN   Crystals CA OXALATE CRYSTALS (A) NEGATIVE  HIV antibody     Status: None   Collection Time: 08/20/16  8:22 PM  Result Value Ref Range   HIV Screen 4th Generation wRfx Non Reactive Non Reactive  Lactic acid, plasma     Status: None   Collection Time: 08/20/16  8:22 PM  Result Value Ref Range   Lactic Acid, Venous 1.6 0.5 - 1.9 mmol/L  Procalcitonin     Status: None   Collection Time: 08/20/16  8:22 PM  Result Value Ref Range   Procalcitonin <0.10 ng/mL  Protime-INR     Status: Abnormal   Collection Time: 08/20/16  8:22 PM  Result Value Ref Range   Prothrombin Time 16.8 (H) 11.4 - 15.2 seconds   INR 1.36   APTT     Status: None   Collection Time: 08/20/16  8:22 PM  Result Value Ref Range   aPTT 29 24 - 36 seconds  C-reactive protein     Status: None   Collection Time:  08/20/16  8:22 PM  Result Value Ref Range   CRP <0.8 <1.0 mg/dL  Sedimentation rate     Status: None   Collection Time: 08/20/16  8:22 PM  Result Value Ref Range   Sed Rate 7 0 - 16 mm/hr  Brain natriuretic peptide     Status: Abnormal   Collection Time: 08/20/16  8:41 PM  Result Value Ref Range   B Natriuretic Peptide 352.6 (H) 0.0 - 100.0 pg/mL  Rapid urine drug screen (hospital performed)     Status: Abnormal   Collection Time: 08/20/16 11:16 PM  Result Value Ref Range   Opiates NONE DETECTED NONE DETECTED   Cocaine NONE DETECTED NONE DETECTED   Benzodiazepines POSITIVE (A) NONE DETECTED   Amphetamines NONE DETECTED NONE DETECTED   Tetrahydrocannabinol NONE DETECTED NONE DETECTED   Barbiturates NONE DETECTED NONE DETECTED  Lactic acid, plasma     Status: None   Collection Time: 08/20/16 11:45 PM  Result Value Ref Range   Lactic Acid, Venous 1.7 0.5 - 1.9 mmol/L  MRSA PCR Screening     Status: None   Collection Time: 08/21/16  4:33 AM  Result Value Ref Range   MRSA by PCR NEGATIVE NEGATIVE  Basic metabolic panel     Status: Abnormal   Collection Time: 08/21/16  6:17 AM  Result Value Ref Range   Sodium 142 135 - 145 mmol/L   Potassium 3.1 (L) 3.5 - 5.1 mmol/L   Chloride 114 (H) 101 - 111 mmol/L   CO2 22 22 - 32 mmol/L   Glucose, Bld 96 65 - 99 mg/dL   BUN 10 6 -  20 mg/dL   Creatinine, Ser 0.79 0.61 - 1.24 mg/dL   Calcium 7.4 (L) 8.9 - 10.3 mg/dL   GFR calc non Af Amer >60 >60 mL/min   GFR calc Af Amer >60 >60 mL/min   Anion gap 6 5 - 15  CBC     Status: Abnormal   Collection Time: 08/21/16  6:17 AM  Result Value Ref Range   WBC 3.7 (L) 4.0 - 10.5 K/uL   RBC 3.99 (L) 4.22 - 5.81 MIL/uL   Hemoglobin 11.8 (L) 13.0 - 17.0 g/dL   HCT 35.4 (L) 39.0 - 52.0 %   MCV 88.7 78.0 - 100.0 fL   MCH 29.6 26.0 - 34.0 pg   MCHC 33.3 30.0 - 36.0 g/dL   RDW 19.0 (H) 11.5 - 15.5 %   Platelets 122 (L) 150 - 400 K/uL    Studies/Results: Dg Thoracic Spine 2 View  Result Date:  08/20/2016 CLINICAL DATA:  Status post two falls, with lower back pain. Initial encounter. EXAM: THORACIC SPINE 2 VIEWS COMPARISON:  Chest radiograph performed 08/12/2016 FINDINGS: There is no evidence of fracture or subluxation. Vertebral bodies demonstrate normal height and alignment. Intervertebral disc spaces are preserved. The visualized portions of both lungs are clear. The mediastinum is unremarkable in appearance. IMPRESSION: No evidence of fracture or subluxation along the thoracic spine. Electronically Signed   By: Garald Balding M.D.   On: 08/20/2016 21:11   Dg Lumbar Spine 2-3 Views  Result Date: 08/20/2016 CLINICAL DATA:  Status post fall, with lower back pain. Initial encounter. EXAM: LUMBAR SPINE - 2-3 VIEW COMPARISON:  Lumbar spine radiographs performed 08/12/2016 FINDINGS: There is no evidence of fracture or subluxation. Vertebral bodies demonstrate normal height and alignment. Intervertebral disc spaces are preserved. Small anterior and posterior disc osteophyte complexes are noted along the lumbar spine. The visualized bowel gas pattern is unremarkable in appearance; air and stool are noted within the colon. The sacroiliac joints are within normal limits. Mild calcification is noted along the abdominal aorta. IMPRESSION: 1. No evidence of fracture or subluxation along the lumbar spine. 2. Minimal degenerative change along the lumbar spine. 3. Mild aortic atherosclerosis noted. Electronically Signed   By: Garald Balding M.D.   On: 08/20/2016 21:11   Ct Head Wo Contrast  Result Date: 08/20/2016 CLINICAL DATA:  Initial evaluation for acute trauma, fall. EXAM: CT HEAD WITHOUT CONTRAST TECHNIQUE: Contiguous axial images were obtained from the base of the skull through the vertex without intravenous contrast. COMPARISON:  Prior CT from 08/12/2016. FINDINGS: Brain: Scattered areas of encephalomalacia involving the bilateral cerebral hemispheres again seen, compatible with remote ischemic  infarcts. Most notable of these within the right temporal region. Additional scattered remote lacunar infarcts within the right basal ganglia. Overall, these are stable from previous. Underlying cerebral atrophy with chronic microvascular ischemic disease also stable. No acute intracranial hemorrhage. No evidence for acute large vessel territory infarct. No mass lesion, midline shift, or mass effect. Ventricular prominence related to global parenchymal volume loss without hydrocephalus. No extra-axial fluid collection. Slight dural thickening overlying the left frontal convexity is stable. Vascular: No hyperdense vessel. Scattered vascular calcifications noted within the carotid siphons. Skull: Scalp soft tissues demonstrate no acute abnormality. Calvarium intact. Sinuses/Orbits: Globes and orbital soft tissues within normal limits. Paranasal sinuses and mastoid air cells are clear. IMPRESSION: 1. No acute intracranial process. 2. 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 3. Stable underlying cerebral atrophy with chronic microvascular ischemic disease. Electronically  Signed   By: Jeannine Boga M.D.   On: 08/20/2016 21:53    Medications: REviewed    @PROBHOSP @  1  Atrial fibrillaton  Rates are better now that he is getting medicine  NO changes for now  Anticoagulate with Eliquis   2  NICM  Volume is up some  Hypoalbuinemia doesn't help  BP is solft  I would hold aldactone for now    3  DVT  REcurrent    COntinue Eliquis    4  Cellulits  On ABX  5  Disposition  I think patient shold not go home  He has demonstrated noncompliance  He is weak  Has fallen  Wife cannot help him  Neighbor cahme /brought him to clnic yesterday  He cannot take care of him   I have spoken with the pt  He should have some rehab or consider assisted living   Patient admits that too much for him and his wife   [name]    LOS: 1 day   [name] 08/21/2016, 2:56 PM

## 2016-08-21 NOTE — Consult Note (Signed)
Cardiology Consultation   Date:  08/20/2016  ID:  Nathan Frank, DOB [birth year], MRN [id number] PCP:  Thressa Sheller, MD  Cardiologist:  Harrington Challenger   Chief Complaint: complicated follow-up  History of Present Illness:  Nathan Frank is a 75 y.o. male with history of NSTEMI (04/2015 possibly due to coronary spasm versus embolus or aborted infarct with lysis 04/2015), cardiomyopathy (EF variable, last 40-45%), SVT, paroxysmal atrial fib/flutter, bifascicular block, DVTs, HTN, hepatitis C, ?hepatocellular carcinoma, cirrhosis, GIB (rev r/t EtOH, NSAIDs, gastritis), alcohol abuse, heroin abuse, marijuana use, prolonged QT, anemia/thrombocytopenia, stroke 06/2016, medication noncompliance who presents for f/u.  He's been admitted multiple times lately with recognition of paroxysmal atrial fib/flutter. In his admissions in July 2017 he was not felt to be a candidate for anticoagulation given h/o GIB, ongoing alcohol abuse with cirrhosis and noncompliance. However, in September 2017 he suffered a stroke as well as imaging revealing chronic RLE DVT and age-indeterminant LLE DVT (with prior DVTs by imaging 2013). Given his large stroke he was not a candidate for anticoagulation at that time - it was recommended he have f/u imaging to determine clot burden and consider IVC filter if residual. He had a prolonged stay that carried over into CIR as well. He was readmitted 11/2-11/5 with atrial fibrillation vs flutter vs both in setting of UTI. The decision was made to place him on Eliquis at that time with cautious OP monitoring.  He was seen back in the ED 08/12/16 after he hurt his back after he fell trying to get out of bed. He also had AF RVR, hypotension (SBP 70s) and lactic acidosis in setting of not taking medication he was recently discharged with. Imaging was negative for acute injury. He was treated with IV hydration, rocephin, and metoprolol and discharged home. Last labs 08/12/16: K 3.5, Cr 0.98, WBC 3.8,  Hgb 11.7, Plt 88. Note prior INRs have ranged 1.2-1.7, albumin 2.2.  He returns for follow-up today with his neighbor. HR remains in the 120s and BP 90s/60s. He denies any CP, SOB, palpitations or awareness of his HR but says he generally feels "bad." He continues to have some back pain and also has left knee redness/mild edema. He has not been taking his Eliquis because he says he has to order it. He has not been taking spironolactone because he has no way to cut it in half. It's not really clear if he's actually taking any of his medicines. His neighbor reports that the patient's wife is really struggling at home to manage him.   Cardiac studies: - Most recent echo 06/16/16: posterior, septal and apical hypokinesis, EF [id number], normal diastolic parameters, mild MR. - Prior cath 04/15/15 showing widely patent coronaries. The right coronary, LAD, ramus, and circumflex are very tortuous. He had low normal to mildly depressed LV systolic function with an estimated EF of 45-50% with mildly elevated LVEDP. It was felt that his NSTEMI was possibly due to coronary spasm versus embolus, stress cardiomyopathy (atypical), and aborted infarction related to plaque rupture with thrombosis and dissolution.     Past Medical History:  Diagnosis Date  . Abnormal TSH   . Acute gastric ulcer   . Acute gastritis with hemorrhage   . Alcohol abuse   . Alcohol dependence ([place]) 02/02/2014  . Anemia   . Bifascicular block   . Cirrhosis ([place]) 06/04/2012  . Depressive disorder 02/01/2014  . Dizziness and giddiness 12/13/2014  . DVT of lower limb, acute ([place]) 06/06/2012  . DVT, lower  extremity, recurrent ([place])    a. noted 2013. b. also dx 06/2016.  Marland Kitchen Essential hypertension   . Fatty liver   . Gallstones   . Gastritis 06/07/2012  . GERD (gastroesophageal reflux disease)   . GI bleed due to NSAIDs 12/13/2014  . Granulomatous gastritis   . Hematuria 06/04/2012  . Hepatitis C   . Hepatocellular carcinoma ([place])   . Heroin  abuse    "I haven't done that since I don't know when."  . Heroin overdose 02/20/2014  . Neuropathy ([place])   . NICM (nonischemic cardiomyopathy) ([place])    a. 04/2015: EF 45-50% by cath. b. EF 40-45% by echo 06/2016.  . NSTEMI (non-ST elevated myocardial infarction) ([place])    a. 04/2015 - patent coronaries. Etiology possibly due to coronary spasm versus embolus, stress cardiomyopathy (atypical), and aborted infarction related to plaque rupture with thrombosis and dissolution. Amlodipine started. Not on antiplatelets due to GIB/cirrhosis history.  . Oral thrush 06/05/2012  . Polysubstance abuse    THC, alcohol, heroin  . Prolonged Q-T interval on ECG    a. 12/2014 - treated with magnesium.  . Right knee pain 12/13/2014  . S/P alcohol detoxification 02/02/2014  . Stroke ([place]) 06/2016  . SVT (supraventricular tachycardia) ([place])    a. 12/2014 in setting of GIB, ETOH, NSAIDS, gastritis.  . Symptomatic cholelithiasis 12/15/2013  . Thrombocytopenia ([place])   . Upper GI bleeding 12/13/2014  . Weight loss 06/04/2012    Past Surgical History:  Procedure Laterality Date  . CARDIAC CATHETERIZATION N/A 04/15/2015   Procedure: Left Heart Cath and Coronary Angiography;  Surgeon: Belva Crome, MD;  Location: [facility];  Service: Cardiovascular;  Laterality: N/A;  . CIRCUMCISION    . ESOPHAGOGASTRODUODENOSCOPY  06/07/2012   Procedure: ESOPHAGOGASTRODUODENOSCOPY (EGD);  Surgeon: Milus Banister, MD;  Location: [facility];  Service: Endoscopy;  Laterality: N/A;  may need treatment of varices  . ESOPHAGOGASTRODUODENOSCOPY N/A 12/14/2014   Procedure: ESOPHAGOGASTRODUODENOSCOPY (EGD);  Surgeon: Jerene Bears, MD;  [facility] END[facility];  Service: Endoscopy;  Laterality: N/A;  . IR GENERIC HISTORICAL  06/25/2016   IR US GUIDE VASC ACCESS LEFT 06/25/2016 Corrie Mckusick, DO [facility]  . IR GENERIC HISTORICAL  06/25/2016   IR RADIOLOGY PERIPHERAL GUIDED IV START 06/25/2016 Corrie Mckusick, DO [facility]  . IR GENERIC  HISTORICAL  06/25/2016   IR PERCUTANEOUS ART THROMBECTOMY/INFUSION INTRACRANIAL INC DIAG ANGIO 06/25/2016 Luanne Bras, MD [facility]  . RADIOLOGY WITH ANESTHESIA N/A 06/25/2016   Procedure: RADIOLOGY WITH ANESTHESIA;  Surgeon: Luanne Bras, MD;  Location: [facility];  Service: Radiology;  Laterality: N/A;  . TONSILLECTOMY      Current Medications: Current Facility-Administered Medications  Medication Dose Route Frequency Provider Last Rate Last Dose  . 0.9 %  sodium chloride infusion   Intravenous Continuous Ivor Costa, MD 125 mL/hr at 08/21/16 0202    . albumin human 25 % solution 50 g  50 g Intravenous Once Ivor Costa, MD      . amitriptyline (ELAVIL) tablet 10 mg  10 mg Oral QHS Ivor Costa, MD   10 mg at 08/21/16 0005  . apixaban (ELIQUIS) tablet 5 mg  5 mg Oral BID Ivor Costa, MD   5 mg at 08/21/16 0002  . ceFAZolin (ANCEF) IVPB 1 g/50 mL premix  1 g Intravenous Q8H Crystal Trellis Moment, RPH   1 g at 08/21/16 0532  . cholecalciferol (VITAMIN D) tablet 1,000 Units  1,000 Units Oral Daily Ivor Costa, MD  1,000 Units at 08/20/16 2149  . folic acid (FOLVITE) tablet 1 mg  1 mg Oral Daily Ivor Costa, MD   1 mg at 08/20/16 2148  . hydrOXYzine (VISTARIL) injection 25 mg  25 mg Intramuscular Q6H PRN Ivor Costa, MD      . levothyroxine (SYNTHROID, LEVOTHROID) tablet 50 mcg  50 mcg Oral QAC breakfast Ivor Costa, MD   50 mcg at 08/21/16 0751  . LORazepam (ATIVAN) injection 0-4 mg  0-4 mg Intravenous Q6H Ivor Costa, MD   1 mg at 08/20/16 2201   Followed by  . [START ON 08/22/2016] LORazepam (ATIVAN) injection 0-4 mg  0-4 mg Intravenous Q12H Ivor Costa, MD      . LORazepam (ATIVAN) tablet 1 mg  1 mg Oral Q6H PRN Ivor Costa, MD       Or  . LORazepam (ATIVAN) injection 1 mg  1 mg Intravenous Q6H PRN Ivor Costa, MD      . magnesium oxide (MAG-OX) tablet 400 mg  400 mg Oral Daily Ivor Costa, MD   400 mg at 08/21/16 0003  . metoprolol tartrate (LOPRESSOR) tablet 50 mg  50 mg Oral TID Ivor Costa, MD   50 mg at  08/20/16 2150  . midodrine (PROAMATINE) tablet 10 mg  10 mg Oral TID WC Ivor Costa, MD   10 mg at 08/21/16 0751  . multivitamin with minerals tablet 1 tablet  1 tablet Oral Daily Ivor Costa, MD   1 tablet at 08/20/16 2147  . oxyCODONE (Oxy IR/ROXICODONE) immediate release tablet 5 mg  5 mg Oral Q4H PRN Ivor Costa, MD   5 mg at 08/21/16 0809  . thiamine (VITAMIN B-1) tablet 100 mg  100 mg Oral Daily Ivor Costa, MD       Or  . thiamine (B-1) injection 100 mg  100 mg Intravenous Daily Ivor Costa, MD   100 mg at 08/20/16 2151  . vancomycin (VANCOCIN) IVPB 1000 mg/200 mL premix  1,000 mg Intravenous Q12H Karren Cobble, RPH   1,000 mg at 08/21/16 [id number]  . zolpidem (AMBIEN) tablet 5 mg  5 mg Oral QHS PRN Ivor Costa, MD   5 mg at 08/21/16 [id number]     Allergies:   Patient has no known allergies.   Social History   Social History  . Marital status: Married    Spouse name: N/A  . Number of children: 3  . Years of education: N/A   Occupational History  . Retired Education officer, [employer]    Social History Main Topics  . Smoking status: Former Smoker    Packs/day: 0.50    Years: 5.00    Types: Cigarettes  . Smokeless tobacco: Never Used     Comment: "quit smoking cigarettes in the 1970's"  . Alcohol use No     Comment: 06/2016 " I quit drinking a couple of weeks ago "  . Drug use: No     Comment: former heroin user-- last used 3 months ago-  . Sexual activity: Not Currently    Birth control/ protection: Condom   Other Topics Concern  . None   Social History Narrative   Lost one son to a gunshot.  Lives with wife.       Family History:  The patient's family history includes Heart disease in his mother; Prostate cancer in his brother; Stroke in his father.   ROS:   Please see the history of present illness.  All other systems are reviewed and otherwise negative.  PHYSICAL EXAM:   VS:  BP 108/74 (BP Location: Left Arm)   Pulse 77   Temp 97 F (36.1 C) (Axillary)   Resp 16   Ht 5\' 11"   (1.803 m)   Wt 198 lb 13.7 oz (90.2 kg)   SpO2 99%   BMI 27.73 kg/m   BMI: Body mass index is 27.73 kg/m. - patient could not stand. GEN: Chronically ill appearing light skinned AAM, in no acute distress  HEENT: normocephalic, atraumatic Neck: no JVD, carotid bruits, or masses Cardiac: Tachycardic, regular with some irregularity; no murmurs, rubs, or gallops, no edema  Respiratory:  clear to auscultation bilaterally, normal work of breathing GI: soft, nontender, nondistended, + BS MS: no deformity or atrophy  Skin: warm and dry, no rash Neuro:  Alert and Oriented x 3, Strength and sensation are intact, follows commands Psych: euthymic mood, full affect  Wt Readings from Last 3 Encounters:  08/21/16 198 lb 13.7 oz (90.2 kg)  08/12/16 198 lb (89.8 kg)  08/09/16 198 lb (89.8 kg)      Studies/Labs Reviewed:   EKG:  EKG was ordered today and personally reviewed by me and demonstrates atrial flutter 127bpm  Recent Labs: 08/06/2016: Magnesium 1.6; TSH 3.005 08/20/2016: ALT 17; B Natriuretic Peptide 352.6 08/21/2016: BUN 10; Creatinine, Ser 0.79; Hemoglobin 11.8; Platelets 122; Potassium 3.1; Sodium 142   Lipid Panel    Component Value Date/Time   CHOL 87 06/26/2016 0500   TRIG 91 06/27/2016 0219   HDL 26 (L) 06/26/2016 0500   CHOLHDL 3.3 06/26/2016 0500   VLDL 23 06/26/2016 0500   LDLCALC 38 06/26/2016 0500    Additional studies/ records that were reviewed today include: Summarized above.    ASSESSMENT & PLAN:   1. Paroxysmal atrial fib/flutter - persistent atrial flutter, with continued elevated rates and hypotension. Discussed patient extensively with Dr. Harrington Challenger who also evaluated the patient. It is unclear if the atrial flutter is causing his hypotension or if there is another underlying issue here that is driving the whole process - he is extremely complex medically, so there could could be multiple factors at play including recent fall, knee injury, medication  noncompliance, (?untreated) UTI, recent persistent lactic acidosis. Would recommend he proceed to the hospital for further management. We were informed there are presently no beds available with a 4-5 bed holding for each unit. Will plan to send to the ED to begin workup for hypotension. Would recommend internal medicine/possible critical care admission, and cardiology will follow along in consultation. Anticipate spironolactone (on board for liver disease) will need to be held due to his hypotension. Would continue beta blocker as HR allows. Diltiazem is less ideal choice given his cardiomyopathy. Cardioversion would be risky as he has not been compliant with anticoagulation. If CBC is stable, would recommend per pharmacy as we await the remainder of his workup. Ultimately may need to consider goals of care evaluation. At the very least he needs assisted living/SNF going home from the hospital as it is apparent he is unable to care for himself at home. 2. NICM - slight edema of thighs, no LEE of ankles. Follow. BP too low for diuresis. Probable contribution from hypoalbuminemia related to liver disease. 3. Bifasiscular block - no signs of bradycardia recently. 4. H/o recurrent DVTs - pulse ox WNL but may need to consider r/o for PE if workup otherwise unrevealing. 5. Cirrhosis with thrombocytopenia - appreciate IM assistance.  Disposition: F/u TBD based on hospital stay.  Medication Adjustments/Labs and  Tests Ordered: Current medicines are reviewed at length with the patient today.  Concerns regarding medicines are outlined above.   Raechel Ache PA-C  08/20/2016 11:34 AM     [address], [place], [place]  [address] Phone: [phone]; Fax: [phone]   Pt seen and examined  I agree with findings as noted by D Dunn Pt with hx PAF, NICM, recurrent PE   He presents today with afib with RVR  Has not been taking meds as directed .  On exam, Lungs rel  clear  Cardiac exam Irreg ireeg  No S3   Ext with 1+ edema   Erythema to R knee (fell)  I would reocmm admitted to [facility] (IM)  Resume meds for rate control and anticoagulation  I do not think pt or his wife can take care of his being at home  Very weak  Neighbors helping but that is limited   I would favor admit to assisted living    [employer]

## 2016-08-21 NOTE — Evaluation (Signed)
Occupational Therapy Evaluation Patient Details Name: Nathan Frank MRN: [id number] DOB: 23-Mar-1941 Today's Date: 08/21/2016    History of Present Illness Pt is a 75 yo male admitted after a fall with back pain and L knee pain.  No fx noted in spine and L knee with cellulitis.  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 Pt would benefit from cont OT to increase independence in basic adls and adl tranfers so he can eventually d/c home with his wife.  Pt had a CVA in 06/2016 and ever since then has been falling and spent more time on walker and in w/c.  Wife is with him when he walks but he is still falling frequently.  Pt would benefit from rehab before returning home with his wife with current knee cellulitis, back pain and old CVA.      Follow Up Recommendations  SNF;Supervision/Assistance - 24 hour    Equipment Recommendations  None recommended by OT    Recommendations for Other Services       Precautions / Restrictions Precautions Precautions: Fall Precaution Comments: watch HR  148 on eval Restrictions Weight Bearing Restrictions: No      Mobility Bed Mobility Overal bed mobility: Needs Assistance;+2 for physical assistance Bed Mobility: Supine to Sit     Supine to sit: Mod assist;+2 for physical assistance     General bed mobility comments: Pt with posterior lean when first up in sitting but this did improve as he sat on EOB longer.  Transfers Overall transfer level: Needs assistance Equipment used: Rolling walker (2 wheeled) Transfers: Sit to/from [facility] Sit to Stand: Mod assist;+2 physical assistance Stand pivot transfers: Min assist;+2 physical assistance       General transfer comment: Verbal cues for hand placement.  Assist to power up to standing, and for balance during transition.     Balance Overall balance assessment: Needs assistance Sitting-balance support: Bilateral upper extremity supported;Feet supported Sitting balance-Leahy Scale: Fair   Postural control: Posterior lean;Right lateral lean Standing balance support: Bilateral upper extremity supported;During functional activity Standing balance-Leahy Scale: Poor Standing balance comment: Pt must have outside assist to remain standing.                            ADL Overall ADL's : Needs assistance/impaired Eating/Feeding: Set up;Sitting   Grooming: Minimal assistance;Sitting   Upper Body Bathing: Minimal assitance;Sitting   Lower Body Bathing: Maximal assistance;Sit to/from stand   Upper Body Dressing : Minimal assistance;Sitting   Lower Body Dressing: Maximal assistance;Sit to/from stand   Toilet Transfer: Moderate assistance;Stand-pivot;RW;BSC   Toileting- Clothing Manipulation and Hygiene: Moderate assistance;Sit to/from stand       Functional mobility during ADLs: +2 for physical assistance;Minimal assistance;Cueing for safety;+2 for safety/equipment General ADL Comments: Pt limited in LE adls and limited due to weakness in L side due to recent CVA.  Pt only has wife at home and seems to be falling more regularly since CVA. Pt may benefit from some rehab that he did not receive post CVA after this fall.       Vision Vision Assessment?: Yes Eye Alignment: Within Functional Limits Ocular Range of Motion: Within Functional Limits Alignment/Gaze Preference: Within Defined Limits Tracking/Visual Pursuits: Requires cues, head turns, or add eye shifts to track Convergence: Impaired -  to be further tested in functional context Visual Fields: Other (comment) (feel pt had more of a L inattnention; not a field cut)   Perception Perception Perception Tested?: Yes Perception Deficits: Inattention/neglect Inattention/Neglect: Does not attend to left side of body   Praxis Praxis Praxis  tested?: Not tested    Pertinent Vitals/Pain Pain Assessment: Faces Faces Pain Scale: Hurts even more Pain Location: back and L knee Pain Descriptors / Indicators: Aching;Sore Pain Intervention(s): Limited activity within patient's tolerance;Monitored during session;Repositioned     Hand Dominance Right   Extremity/Trunk Assessment Upper Extremity Assessment Upper Extremity Assessment: LUE deficits/detail LUE Deficits / Details: ROM:  shoulder flexion to 100, elbow extension -25.  Strength:  shoulder 2/5, biceps 3+/3, triceps 2/5, grip 3/5, finger extension 2/5.  Difficult to fully assess due to increased tone in this extremity. LUE: Unable to fully assess due to pain;Unable to fully assess due to immobilization LUE Sensation: decreased light touch;decreased proprioception LUE Coordination: decreased gross motor;decreased fine motor   Lower Extremity Assessment Lower Extremity Assessment: Defer to PT evaluation   Cervical / Trunk Assessment Cervical / Trunk Assessment: Kyphotic   Communication Communication Communication: No difficulties   Cognition Arousal/Alertness: Awake/alert Behavior During Therapy: WFL for tasks assessed/performed Overall Cognitive Status: History of cognitive impairments - at baseline                     General Comments       Exercises       Shoulder Instructions      Home Living Family/patient expects to be discharged to:: Private residence Living Arrangements: Spouse/significant other Available Help at Discharge: Family;Available 24 hours/day Type of Home: House Home Access: Stairs to enter [employer] of Steps: 2 Entrance Stairs-Rails: Right;Left;Can reach both Home Layout: One level     Bathroom Shower/Tub: Tub/shower unit;Curtain Shower/tub characteristics: Architectural technologist: Handicapped height Bathroom Accessibility: Yes How Accessible: Accessible via wheelchair;Accessible via walker Home Equipment: [place]  - single point;Grab bars - tub/shower;Hand held shower head;Walker - 2 wheels;Tub bench   Additional Comments: only has wife to assist.  Lives With: Spouse    Prior Functioning/Environment Level of Independence: Needs assistance  Gait / Transfers Assistance Needed: Uses RW for gait "since stroke" and wife is with him most of the time he walks. ADL's / Homemaking Assistance Needed: assist with adls, meal prep, housekeeping.  Pt needs help getting into shower but then can bathe, needs help walking to toilet but independent when sitting and requires assist with LE dressing.   Comments: Used SPC for mobility but recently has been using RW         OT Problem List: Decreased strength;Decreased range of motion;Decreased activity tolerance;Impaired balance (sitting and/or standing);Impaired vision/perception;Decreased coordination;Decreased cognition;Decreased safety awareness;Decreased knowledge of use of DME or AE;Decreased knowledge of precautions;Impaired sensation;Impaired tone;Impaired UE functional use;Pain   OT Treatment/Interventions: Self-care/ADL training;DME and/or AE instruction;Therapeutic activities    OT Goals(Current goals can be found in the care plan section) Acute Rehab OT Goals Patient Stated Goal: to get better OT Goal Formulation: With patient Time For Goal Achievement: 09/04/16 Potential to Achieve Goals: Good ADL Goals Pt Will Perform Grooming: with min guard assist;standing Pt Will Perform Lower Body Bathing: with min assist;sit to/from stand Pt Will Perform Lower Body Dressing: with min assist;sit to/from stand Pt Will Transfer to Toilet: with min assist;ambulating;bedside commode Pt Will Perform Tub/Shower Transfer: Tub transfer;with min assist;tub bench;grab bars;ambulating;rolling walker  OT Frequency: Min 2X/week   Barriers to D/C:  Decreased caregiver support  only wife at home to assist       Co-evaluation PT/OT/SLP Co-Evaluation/Treatment: Yes Reason  for Co-Treatment: For patient/therapist safety PT goals addressed during session: Mobility/safety with mobility OT goals addressed during session: ADL's and self-care      End of Session Equipment Utilized During Treatment: Rolling walker Nurse Communication: Mobility status  Activity Tolerance: Patient limited by pain;Other (comment) (HR up to 149) Patient left: in chair;with call bell/phone within reach;with chair alarm set   Time: [phone] OT Time Calculation (min): 31 min Charges:  OT General Charges $OT Visit: 1 Procedure OT Evaluation $OT Eval Moderate Complexity: 1 Procedure G-Codes:    Glenford Peers 2016/08/26, 10:53 AM  [phone]

## 2016-08-21 NOTE — Care Management Note (Signed)
Case Management Note  Patient Details  Name: Nathan Frank MRN: [id number] Date of Birth: 1941/02/13  Subjective/Objective:   Presents withcellulitis of left knee, bp low , has received 3 liters boluses. Has afib on eliquis, he had  A cva three weeks ago.  CT this admit is negative, cont on vanc iv.  Await pt/ot eval.  Patient is active with [facility] for [place], PT, [place] , and SW and ST.                   Action/Plan:   Expected Discharge Date:  08/22/16               Expected Discharge Plan:  [place]  In-House Referral:     Discharge planning Services     Post Acute Care Choice:  Resumption of Svcs/PTA Provider Choice offered to:     DME Arranged:    DME Agency:     HH Arranged:  RN, PT, OT, Speech Therapy, social worker [place] Agency:  [place]  Status of Service:  In process, will continue to follow  If discussed at Long Length of Stay Meetings, dates discussed:    Additional Comments:  Zenon Mayo, RN 08/21/2016, 2:10 PM

## 2016-08-21 NOTE — Progress Notes (Signed)
Progress Note    Nathan Frank  [id number] DOB: 12-21-1940  DOA: 08/20/2016 PCP: Thressa Sheller, MD    Brief Narrative:   Chief complaint: Follow-up cellulitis.  Nathan Frank is an 75 y.o. male the PMH of alcohol abuse, heroin abuse, alcoholic liver cirrhosis, hepatitis C, hypertension, GERD, hypothyroidism, depression, atrial fibrillation on chronic Eliquis therapy, history of DVT, chronic systolic CHF with an EF of 40-45 percent, medical nonadherence who was admitted 08/20/16 with a chief complaint of fall with injury to the left knee and back. Upon initial evaluation, the patient was noted to be mildly hypotensive with stabilization of blood pressure after a bolus of normal saline. He was tachycardic at 127 bpm. Changes of cellulitis noted to the left knee area.  Assessment/Plan:   Principal Problem:   Cellulitis of left knee Lactic acid was normal on admission and the patient's blood pressure responded to a normal saline bolus and midodrine. Continue Ancef/vancomycin. ESR/CRP not elevated. Follow up blood cultures. Plain films of the knee negative for fracture.  Active Problems:   Hypokalemia Supplement.    Alcohol abuse Continue Ativan per detox protocol. CIWA score 2. Continue thiamine/folic acid supplementation.    Depressive disorder Continue amitriptyline at bedtime.    Thrombocytopenia (HCC) Mild. Likely related to alcohol abuse/cirrhosis.    History of Essential hypertension/hypotension Continue metoprolol. Spironolactone on hold secondary to soft blood pressure. Midodrine started to help maintain blood pressure.    Atrial fibrillation with RVR (HCC) Atrial fibrillation persists on telemetry. Is on Eliquis chronically. Rate controlled. Continue metoprolol.    Alcoholic cirrhosis of liver with ascites ([place]) Supportive care.    Fall/Back injury PT/OT evaluations. Oxycodone ordered as needed for back pain.    Chronic systolic CHF (congestive heart  failure) (HCC)  Currently compensated. Continue metoprolol.   Family Communication/Anticipated D/C date and plan/Code Status   DVT prophylaxis: Eliquis ordered. Code Status: Full Code.  Family Communication: No family at the bedside. Disposition Plan: Likely will need SNF. PT/OT evaluations requested.   Medical Consultants:    None.   Procedures:    None  Anti-Infectives:    Ancef 08/20/16--->  Vancomycin 08/20/16--->   Subjective:   The patient reports that he is having back pain, dull, not completely relieved by oxycodone. Review of symptoms is positive for knee pain and negative for dyspnea, nausea.  Objective:    Vitals:   08/21/16 0215 08/21/16 0252 08/21/16 0500 08/21/16 0700  BP: 94/68 108/74    Pulse: 74 77    Resp: 16     Temp:  97 F (36.1 C)  97.5 F (36.4 C)  TempSrc:  Axillary  Oral  SpO2: 99%     Weight:   90.2 kg (198 lb 13.7 oz)   Height:        Intake/Output Summary (Last 24 hours) at 08/21/16 0911 Last data filed at 08/21/16 0800  Gross per 24 hour  Intake             1075 ml  Output                0 ml  Net             1075 ml   Filed Weights   08/20/16 1302 08/21/16 0500  Weight: 89.8 kg (198 lb) 90.2 kg (198 lb 13.7 oz)    Exam: General exam: Appears calm and comfortable. Unkempt.  Sitting up in chair. Respiratory system: Clear to auscultation. Respiratory effort normal. Cardiovascular  system: HSIR. No JVD,  rubs, gallops or clicks. No murmurs. Gastrointestinal system: Abdomen is nondistended, soft and nontender. No organomegaly or masses felt. Normal bowel sounds heard. Central nervous system: Alert and oriented x 2. No focal neurological deficits. Extremities: No clubbing,  or cyanosis. No edema. Venous stasis dermatitis.  Erythema left knee as pictured below. Skin: No rashes, scabs noted LLE. Psychiatry: Judgement and insight appear impaired. Mood & affect flat.      Data Reviewed:   I have personally reviewed  following labs and imaging studies:  Labs: Basic Metabolic Panel:  Recent Labs Lab 08/20/16 1517 08/21/16 0617  NA 143 142  K 3.9 3.1*  CL 111 114*  CO2 22 22  GLUCOSE 99 96  BUN 8 10  CREATININE 0.87 0.79  CALCIUM 8.2* 7.4*   GFR Estimated Creatinine Clearance: 85 mL/min (by C-G formula based on SCr of 0.79 mg/dL). Liver Function Tests:  Recent Labs Lab 08/20/16 1517  AST 41  ALT 17  ALKPHOS 70  BILITOT 1.0  PROT 6.4*  ALBUMIN 2.4*   Coagulation profile  Recent Labs Lab 08/20/16 2022  INR 1.36    CBC:  Recent Labs Lab 08/20/16 1517 08/21/16 0617  WBC 4.2 3.7*  NEUTROABS 1.6*  --   HGB 12.7* 11.8*  HCT 38.4* 35.4*  MCV 87.7 88.7  PLT 139* 122*   Sepsis Labs:  Recent Labs Lab 08/20/16 1517 08/20/16 2022 08/20/16 2345 08/21/16 0617  PROCALCITON  --  <0.10  --   --   WBC 4.2  --   --  3.7*  LATICACIDVEN  --  1.6 1.7  --     Microbiology No results found for this or any previous visit (from the past 240 hour(s)).  Radiology: Dg Thoracic Spine 2 View  Result Date: 08/20/2016 CLINICAL DATA:  Status post two falls, with lower back pain. Initial encounter. EXAM: THORACIC SPINE 2 VIEWS COMPARISON:  Chest radiograph performed 08/12/2016 FINDINGS: There is no evidence of fracture or subluxation. Vertebral bodies demonstrate normal height and alignment. Intervertebral disc spaces are preserved. The visualized portions of both lungs are clear. The mediastinum is unremarkable in appearance. IMPRESSION: No evidence of fracture or subluxation along the thoracic spine. Electronically Signed   By: Garald Balding M.D.   On: 08/20/2016 21:11   Dg Lumbar Spine 2-3 Views  Result Date: 08/20/2016 CLINICAL DATA:  Status post fall, with lower back pain. Initial encounter. EXAM: LUMBAR SPINE - 2-3 VIEW COMPARISON:  Lumbar spine radiographs performed 08/12/2016 FINDINGS: There is no evidence of fracture or subluxation. Vertebral bodies demonstrate normal height and  alignment. Intervertebral disc spaces are preserved. Small anterior and posterior disc osteophyte complexes are noted along the lumbar spine. The visualized bowel gas pattern is unremarkable in appearance; air and stool are noted within the colon. The sacroiliac joints are within normal limits. Mild calcification is noted along the abdominal aorta. IMPRESSION: 1. No evidence of fracture or subluxation along the lumbar spine. 2. Minimal degenerative change along the lumbar spine. 3. Mild aortic atherosclerosis noted. Electronically Signed   By: Garald Balding M.D.   On: 08/20/2016 21:11   Ct Head Wo Contrast  Result Date: 08/20/2016 CLINICAL DATA:  Initial evaluation for acute trauma, fall. EXAM: CT HEAD WITHOUT CONTRAST TECHNIQUE: Contiguous axial images were obtained from the base of the skull through the vertex without intravenous contrast. COMPARISON:  Prior CT from 08/12/2016. FINDINGS: Brain: Scattered areas of encephalomalacia involving the bilateral cerebral hemispheres again seen, compatible with remote  ischemic infarcts. Most notable of these within the right temporal region. Additional scattered remote lacunar infarcts within the right basal ganglia. Overall, these are stable from previous. Underlying cerebral atrophy with chronic microvascular ischemic disease also stable. No acute intracranial hemorrhage. No evidence for acute large vessel territory infarct. No mass lesion, midline shift, or mass effect. Ventricular prominence related to global parenchymal volume loss without hydrocephalus. No extra-axial fluid collection. Slight dural thickening overlying the left frontal convexity is stable. Vascular: No hyperdense vessel. Scattered vascular calcifications noted within the carotid siphons. Skull: Scalp soft tissues demonstrate no acute abnormality. Calvarium intact. Sinuses/Orbits: Globes and orbital soft tissues within normal limits. Paranasal sinuses and mastoid air cells are clear. IMPRESSION:  1. No acute intracranial process. 2. 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 3. Stable underlying cerebral atrophy with chronic microvascular ischemic disease. Electronically Signed   By: Jeannine Boga M.D.   On: 08/20/2016 21:53   Dg Knee Complete 4 Views Left  Result Date: 08/20/2016 CLINICAL DATA:  Left knee pain, swelling and increased warmth after falling onto the knee 3 days ago. EXAM: LEFT KNEE - COMPLETE 4+ VIEW COMPARISON:  None. FINDINGS: Mild diffuse subcutaneous edema. Diffuse osteopenia. No fracture, dislocation or effusion seen. Anterior patellar spurs are noted. IMPRESSION: No fracture or effusion. Diffuse osteopenia and mild diffuse subcutaneous edema. Electronically Signed   By: Claudie Revering M.D.   On: 08/20/2016 14:29    Medications:   . albumin human  50 g Intravenous Once  . amitriptyline  10 mg Oral QHS  . apixaban  5 mg Oral BID  .  ceFAZolin (ANCEF) IV  1 g Intravenous Q8H  . cholecalciferol  1,000 Units Oral Daily  . folic acid  1 mg Oral Daily  . levothyroxine  50 mcg Oral QAC breakfast  . LORazepam  0-4 mg Intravenous Q6H   Followed by  . [START ON 08/22/2016] LORazepam  0-4 mg Intravenous Q12H  . magnesium oxide  400 mg Oral Daily  . metoprolol  50 mg Oral TID  . midodrine  10 mg Oral TID WC  . multivitamin with minerals  1 tablet Oral Daily  . thiamine  100 mg Oral Daily   Or  . thiamine  100 mg Intravenous Daily  . vancomycin  1,000 mg Intravenous Q12H   Continuous Infusions: . sodium chloride 125 mL/hr at 08/21/16 0202    Medical decision making is of high complexity and this patient is at high risk of deterioration, therefore this is a level 3 visit.   LOS: 1 day   Michah Minton  [facility] Pager [phone]. If unable to reach me by pager, please call my cell phone at [phone].  *Please refer to [email or web address], password [email or web address] to get updated schedule on who will round on this  patient, as hospitalists switch teams weekly. If 7PM-7AM, please contact night-coverage at [email or web address], password [email or web address] for any overnight needs.  08/21/2016, 9:11 AM

## 2016-08-21 NOTE — Consult Note (Signed)
   [facility] CM Inpatient Consult   08/21/2016  SAMMIE AKKERMAN 04-16-1941 [id number]   Patient was recently referred to [facility] for chronic disease management services.  Patient has attempted post follow up and was referred from inpatient rehab social worker regarding ongoing community support needs. Consent on file.  Will follow up with inpatient RNCM regarding disposition and needs.  Of note, [facility] Care Management services does not replace or interfere with any services that are needed or arranged by inpatient case management or social work.  For additional questions or referrals please contact:  Natividad Brood, RN BSN [facility] Liaison  [phone] business mobile phone Toll free office [phone]

## 2016-08-22 DIAGNOSIS — I110 Hypertensive heart disease with heart failure: Secondary | ICD-10-CM | POA: Diagnosis not present

## 2016-08-22 DIAGNOSIS — I480 Paroxysmal atrial fibrillation: Secondary | ICD-10-CM | POA: Diagnosis not present

## 2016-08-22 DIAGNOSIS — A419 Sepsis, unspecified organism: Secondary | ICD-10-CM | POA: Diagnosis not present

## 2016-08-22 DIAGNOSIS — D696 Thrombocytopenia, unspecified: Secondary | ICD-10-CM | POA: Diagnosis not present

## 2016-08-22 DIAGNOSIS — I429 Cardiomyopathy, unspecified: Secondary | ICD-10-CM | POA: Diagnosis not present

## 2016-08-22 DIAGNOSIS — N390 Urinary tract infection, site not specified: Secondary | ICD-10-CM | POA: Diagnosis not present

## 2016-08-22 DIAGNOSIS — I4892 Unspecified atrial flutter: Secondary | ICD-10-CM | POA: Diagnosis not present

## 2016-08-22 DIAGNOSIS — Z9114 Patient's other noncompliance with medication regimen: Secondary | ICD-10-CM | POA: Diagnosis not present

## 2016-08-22 DIAGNOSIS — I82501 Chronic embolism and thrombosis of unspecified deep veins of right lower extremity: Secondary | ICD-10-CM | POA: Diagnosis not present

## 2016-08-22 DIAGNOSIS — I452 Bifascicular block: Secondary | ICD-10-CM | POA: Diagnosis not present

## 2016-08-22 DIAGNOSIS — L03116 Cellulitis of left lower limb: Secondary | ICD-10-CM | POA: Diagnosis not present

## 2016-08-22 DIAGNOSIS — I959 Hypotension, unspecified: Secondary | ICD-10-CM | POA: Diagnosis not present

## 2016-08-22 DIAGNOSIS — R000 Tachycardia, unspecified: Secondary | ICD-10-CM | POA: Diagnosis not present

## 2016-08-22 DIAGNOSIS — R627 Adult failure to thrive: Secondary | ICD-10-CM | POA: Diagnosis not present

## 2016-08-22 DIAGNOSIS — I5022 Chronic systolic (congestive) heart failure: Secondary | ICD-10-CM | POA: Diagnosis not present

## 2016-08-22 DIAGNOSIS — K760 Fatty (change of) liver, not elsewhere classified: Secondary | ICD-10-CM | POA: Diagnosis not present

## 2016-08-22 DIAGNOSIS — S3992XD Unspecified injury of lower back, subsequent encounter: Secondary | ICD-10-CM

## 2016-08-22 LAB — BASIC METABOLIC PANEL
ANION GAP: 8 (ref 5–15)
BUN: 10 mg/dL (ref 6–20)
CALCIUM: 7.7 mg/dL — AB (ref 8.9–10.3)
CHLORIDE: 112 mmol/L — AB (ref 101–111)
CO2: 20 mmol/L — AB (ref 22–32)
Creatinine, Ser: 0.87 mg/dL (ref 0.61–1.24)
GFR calc non Af Amer: >60 mL/min (ref >60)
Glucose, Bld: 84 mg/dL (ref 65–99)
Potassium: 4.4 mmol/L (ref 3.5–5.1)
SODIUM: 140 mmol/L (ref 135–145)

## 2016-08-22 LAB — CBC
HEMATOCRIT: 36.9 % — AB (ref 39.0–52.0)
Hemoglobin: 12.1 g/dL — ABNORMAL LOW (ref 13.0–17.0)
MCH: 29.4 pg (ref 26.0–34.0)
MCHC: 32.8 g/dL (ref 30.0–36.0)
MCV: 89.6 fL (ref 78.0–100.0)
PLATELETS: 135 10*3/uL — AB (ref 150–400)
RBC: 4.12 MIL/uL — AB (ref 4.22–5.81)
RDW: 19.1 % — ABNORMAL HIGH (ref 11.5–15.5)
WBC: 4.5 10*3/uL (ref 4.0–10.5)

## 2016-08-22 LAB — MAGNESIUM: MAGNESIUM: 1.3 mg/dL — AB (ref 1.7–2.4)

## 2016-08-22 LAB — URINE CULTURE: Culture: NO GROWTH

## 2016-08-22 NOTE — Progress Notes (Signed)
In rate controlled a-fib on Eliquis. Dr. Harrington Challenger feels the patient should consider rehab or skilled nursing facility after discharge. No further suggestions at this point. Cardiology will be available as needed over the weekend. Please call with questions.  Pixie Casino, MD, [facility] Attending Cardiologist [place]

## 2016-08-22 NOTE — Progress Notes (Addendum)
Progress Note    Nathan Frank  HTD:[id number] DOB: Jan 22, 1941  DOA: 08/20/2016 PCP: Thressa Sheller, MD    Brief Narrative:   Chief complaint: Follow-up cellulitis.  Nathan Frank is an 75 y.o. male the PMH of alcohol abuse, heroin abuse, alcoholic liver cirrhosis, hepatitis C, hypertension, GERD, hypothyroidism, depression, atrial fibrillation on chronic Eliquis therapy, history of DVT, chronic systolic CHF with an EF of 40-45 percent, medical nonadherence who was admitted 08/20/16 with a chief complaint of fall with injury to the left knee and back. Upon initial evaluation, the patient was noted to be mildly hypotensive with stabilization of blood pressure after a bolus of normal saline. He was tachycardic at 127 bpm. Changes of cellulitis noted to the left knee area.  Assessment/Plan:   Principal Problem:   Cellulitis of left knee Lactic acid was normal on admission and the patient's blood pressure responded to a normal saline bolus and midodrine. Continue Ancef/vancomycin. ESR/CRP not elevated. Follow up blood cultures. Plain films of the knee negative for fracture. Cellulitis improved, left knee less erythematous today.  Active Problems:   Hypomagnesemia Continue to supplement.    Hypokalemia Resolved with supplementation.    Alcohol abuse Continue Ativan per detox protocol. CIWA score 0. Continue thiamine/folic acid supplementation.    Depressive disorder Continue amitriptyline at bedtime.    Thrombocytopenia (HCC) Mild. Likely related to alcohol abuse/cirrhosis.    History of Essential hypertension/hypotension Continue metoprolol. Spironolactone on hold secondary to soft blood pressure. Midodrine started to help maintain blood pressure.    Atrial fibrillation with RVR (HCC) Atrial fibrillation persists on telemetry. Is on Eliquis chronically. Rate controlled. Continue metoprolol.    Alcoholic cirrhosis of liver with ascites ([place]) Supportive care.   Fall/Back injury PT/OT evaluations performed.  SNF recommended. Oxycodone ordered as needed for back pain. Avoid IV opiates, or escalation of opiate dosing.    Chronic systolic CHF (congestive heart failure) (HCC)  Currently compensated. Continue metoprolol.   Family Communication/Anticipated D/C date and plan/Code Status   DVT prophylaxis: Eliquis ordered. Code Status: Full Code.  Family Communication: Wife updated at the bedside. Disposition Plan: SW consult for SNF placement. Likely ready for d/c 08/23/16 if SNF bed found.   Medical Consultants:    Cardiology   Procedures:    None  Anti-Infectives:    Ancef 08/20/16--->  Vancomycin 08/20/16--->   Subjective:   The patient reports that he has a poor appetite. Review of symptoms is positive for knee pain/back pain and negative for dyspnea, nausea. Bowels are moving.  Objective:    Vitals:   08/21/16 2245 08/21/16 2321 08/22/16 0344 08/22/16 0757  BP: (!) 108/48 (!) 88/61 104/75 106/74  Pulse: (!) 119 76 94 90  Resp:  _0 Temp:  98.3 F (36.8 C) 98.2 F (36.8 C) 97.5 F (36.4 C)  TempSrc:  Oral Oral Oral  SpO2:  97% 98%   Weight:   91.9 kg (202 lb 9.6 oz)   Height:        Intake/Output Summary (Last 24 hours) at 08/22/16 0835 Last data filed at 08/22/16 0700  Gross per 24 hour  Intake             2350 ml  Output              144 ml  Net             2206 ml   Filed Weights   08/20/16 1302 08/21/16 0500 08/22/16 0344  Weight: 89.8 kg (198 lb) 90.2 kg (198 lb 13.7 oz) 91.9 kg (202 lb 9.6 oz)    Exam: General exam: Appears calm and comfortable. Unkempt.   Respiratory system: Clear to auscultation. Respiratory effort normal. Cardiovascular system: HSIR. No JVD,  rubs, gallops or clicks. No murmurs. Gastrointestinal system: Abdomen is nondistended, soft and nontender. No organomegaly or masses felt. Normal bowel sounds heard. Central nervous system: Alert and oriented x 2. No focal neurological  deficits. Extremities: No clubbing,  or cyanosis. No edema. Venous stasis dermatitis.  Erythema left knee improving. Skin: No rashes, scabs noted LLE. Psychiatry: Judgement and insight appear impaired. Mood & affect flat.    Data Reviewed:   I have personally reviewed following labs and imaging studies:  Labs: Basic Metabolic Panel:  Recent Labs Lab 08/20/16 1517 08/21/16 0617 08/22/16 0500  NA 143 142 140  K 3.9 3.1* 4.4  CL 111 114* 112*  CO2 22 22 20*  GLUCOSE 99 96 84  BUN _0 CREATININE 0.87 0.79 0.87  CALCIUM 8.2* 7.4* 7.7*  MG  --   --  1.3*   GFR Estimated Creatinine Clearance: 85 mL/min (by C-G formula based on SCr of 0.87 mg/dL). Liver Function Tests:  Recent Labs Lab 08/20/16 1517  AST 41  ALT 17  ALKPHOS 70  BILITOT 1.0  PROT 6.4*  ALBUMIN 2.4*   Coagulation profile  Recent Labs Lab 08/20/16 2022  INR 1.36    CBC:  Recent Labs Lab 08/20/16 1517 08/21/16 0617 08/22/16 0500  WBC 4.2 3.7* 4.5  NEUTROABS 1.6*  --   --   HGB 12.7* 11.8* 12.1*  HCT 38.4* 35.4* 36.9*  MCV 87.7 88.7 89.6  PLT 139* 122* 135*   Sepsis Labs:  Recent Labs Lab 08/20/16 1517 08/20/16 2022 08/20/16 2345 08/21/16 0617 08/22/16 0500  PROCALCITON  --  <0.10  --   --   --   WBC 4.2  --   --  3.7* 4.5  LATICACIDVEN  --  1.6 1.7  --   --     Microbiology Recent Results (from the past 240 hour(s))  Culture, blood (routine x 2)     Status: None (Preliminary result)   Collection Time: 08/20/16  8:25 PM  Result Value Ref Range Status   Specimen Description BLOOD RIGHT FOREARM  Final   Special Requests BOTTLES DRAWN AEROBIC ONLY 5CC  Final   Culture NO GROWTH < 24 HOURS  Final   Report Status PENDING  Incomplete  Culture, blood (routine x 2)     Status: None (Preliminary result)   Collection Time: 08/20/16  8:30 PM  Result Value Ref Range Status   Specimen Description BLOOD RIGHT HAND  Final   Special Requests BOTTLES DRAWN AEROBIC AND ANAEROBIC 5CC   Final   Culture NO GROWTH < 24 HOURS  Final   Report Status PENDING  Incomplete  MRSA PCR Screening     Status: None   Collection Time: 08/21/16  4:33 AM  Result Value Ref Range Status   MRSA by PCR NEGATIVE NEGATIVE Final    Comment:        The GeneXpert MRSA Assay (FDA approved for NASAL specimens only), is one component of a comprehensive MRSA colonization surveillance program. It is not intended to diagnose MRSA infection nor to guide or monitor treatment for MRSA infections.     Radiology: Dg Thoracic Spine 2 View  Result Date: 08/20/2016 CLINICAL DATA:  Status post two falls, with lower  back pain. Initial encounter. EXAM: THORACIC SPINE 2 VIEWS COMPARISON:  Chest radiograph performed 08/12/2016 FINDINGS: There is no evidence of fracture or subluxation. Vertebral bodies demonstrate normal height and alignment. Intervertebral disc spaces are preserved. The visualized portions of both lungs are clear. The mediastinum is unremarkable in appearance. IMPRESSION: No evidence of fracture or subluxation along the thoracic spine. Electronically Signed   By: Garald Balding M.D.   On: 08/20/2016 21:11   Dg Lumbar Spine 2-3 Views  Result Date: 08/20/2016 CLINICAL DATA:  Status post fall, with lower back pain. Initial encounter. EXAM: LUMBAR SPINE - 2-3 VIEW COMPARISON:  Lumbar spine radiographs performed 08/12/2016 FINDINGS: There is no evidence of fracture or subluxation. Vertebral bodies demonstrate normal height and alignment. Intervertebral disc spaces are preserved. Small anterior and posterior disc osteophyte complexes are noted along the lumbar spine. The visualized bowel gas pattern is unremarkable in appearance; air and stool are noted within the colon. The sacroiliac joints are within normal limits. Mild calcification is noted along the abdominal aorta. IMPRESSION: 1. No evidence of fracture or subluxation along the lumbar spine. 2. Minimal degenerative change along the lumbar spine.  3. Mild aortic atherosclerosis noted. Electronically Signed   By: Garald Balding M.D.   On: 08/20/2016 21:11   Ct Head Wo Contrast  Result Date: 08/20/2016 CLINICAL DATA:  Initial evaluation for acute trauma, fall. EXAM: CT HEAD WITHOUT CONTRAST TECHNIQUE: Contiguous axial images were obtained from the base of the skull through the vertex without intravenous contrast. COMPARISON:  Prior CT from 08/12/2016. FINDINGS: Brain: Scattered areas of encephalomalacia involving the bilateral cerebral hemispheres again seen, compatible with remote ischemic infarcts. Most notable of these within the right temporal region. Additional scattered remote lacunar infarcts within the right basal ganglia. Overall, these are stable from previous. Underlying cerebral atrophy with chronic microvascular ischemic disease also stable. No acute intracranial hemorrhage. No evidence for acute large vessel territory infarct. No mass lesion, midline shift, or mass effect. Ventricular prominence related to global parenchymal volume loss without hydrocephalus. No extra-axial fluid collection. Slight dural thickening overlying the left frontal convexity is stable. Vascular: No hyperdense vessel. Scattered vascular calcifications noted within the carotid siphons. Skull: Scalp soft tissues demonstrate no acute abnormality. Calvarium intact. Sinuses/Orbits: Globes and orbital soft tissues within normal limits. Paranasal sinuses and mastoid air cells are clear. IMPRESSION: 1. No acute intracranial process. 2. 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 3. Stable underlying cerebral atrophy with chronic microvascular ischemic disease. Electronically Signed   By: Jeannine Boga M.D.   On: 08/20/2016 21:53   Dg Knee Complete 4 Views Left  Result Date: 08/20/2016 CLINICAL DATA:  Left knee pain, swelling and increased warmth after falling onto the knee 3 days ago. EXAM: LEFT KNEE -  COMPLETE 4+ VIEW COMPARISON:  None. FINDINGS: Mild diffuse subcutaneous edema. Diffuse osteopenia. No fracture, dislocation or effusion seen. Anterior patellar spurs are noted. IMPRESSION: No fracture or effusion. Diffuse osteopenia and mild diffuse subcutaneous edema. Electronically Signed   By: Claudie Revering M.D.   On: 08/20/2016 14:29    Medications:   . albumin human  50 g Intravenous Once  . amitriptyline  10 mg Oral QHS  . apixaban  5 mg Oral BID  .  ceFAZolin (ANCEF) IV  1 g Intravenous Q8H  . cholecalciferol  1,000 Units Oral Daily  . folic acid  1 mg Oral Daily  . levothyroxine  50 mcg Oral QAC breakfast  . LORazepam  0-4 mg Intravenous Q6H   Followed by  . LORazepam  0-4 mg Intravenous Q12H  . magnesium oxide  400 mg Oral Daily  . metoprolol  50 mg Oral TID  . midodrine  10 mg Oral TID WC  . multivitamin with minerals  1 tablet Oral Daily  . thiamine  100 mg Oral Daily   Or  . thiamine  100 mg Intravenous Daily  . vancomycin  1,000 mg Intravenous Q12H   Continuous Infusions: . sodium chloride 50 mL/hr at 08/22/16 9242    Medical decision making is of high complexity and this patient is at high risk of deterioration, therefore this is a level 3 visit.   LOS: 2 days   Decoda Van  [facility] Pager [phone]. If unable to reach me by pager, please call my cell phone at [phone].  *Please refer to [email or web address], password [email or web address] to get updated schedule on who will round on this patient, as hospitalists switch teams weekly. If 7PM-7AM, please contact night-coverage at [email or web address], password [email or web address] for any overnight needs.  08/22/2016, 8:35 AM

## 2016-08-23 DIAGNOSIS — I5022 Chronic systolic (congestive) heart failure: Secondary | ICD-10-CM | POA: Diagnosis not present

## 2016-08-23 DIAGNOSIS — I452 Bifascicular block: Secondary | ICD-10-CM | POA: Diagnosis not present

## 2016-08-23 DIAGNOSIS — M25562 Pain in left knee: Secondary | ICD-10-CM | POA: Diagnosis not present

## 2016-08-23 DIAGNOSIS — Z9114 Patient's other noncompliance with medication regimen: Secondary | ICD-10-CM | POA: Diagnosis not present

## 2016-08-23 DIAGNOSIS — I4891 Unspecified atrial fibrillation: Secondary | ICD-10-CM | POA: Diagnosis not present

## 2016-08-23 DIAGNOSIS — R000 Tachycardia, unspecified: Secondary | ICD-10-CM | POA: Diagnosis not present

## 2016-08-23 DIAGNOSIS — A419 Sepsis, unspecified organism: Secondary | ICD-10-CM | POA: Diagnosis not present

## 2016-08-23 DIAGNOSIS — L03119 Cellulitis of unspecified part of limb: Secondary | ICD-10-CM | POA: Diagnosis not present

## 2016-08-23 DIAGNOSIS — R1311 Dysphagia, oral phase: Secondary | ICD-10-CM | POA: Diagnosis not present

## 2016-08-23 DIAGNOSIS — L03116 Cellulitis of left lower limb: Secondary | ICD-10-CM

## 2016-08-23 DIAGNOSIS — M545 Low back pain: Secondary | ICD-10-CM | POA: Diagnosis not present

## 2016-08-23 DIAGNOSIS — I10 Essential (primary) hypertension: Secondary | ICD-10-CM | POA: Diagnosis not present

## 2016-08-23 DIAGNOSIS — I959 Hypotension, unspecified: Secondary | ICD-10-CM | POA: Diagnosis not present

## 2016-08-23 DIAGNOSIS — R278 Other lack of coordination: Secondary | ICD-10-CM | POA: Diagnosis not present

## 2016-08-23 DIAGNOSIS — I4892 Unspecified atrial flutter: Secondary | ICD-10-CM | POA: Diagnosis not present

## 2016-08-23 DIAGNOSIS — K760 Fatty (change of) liver, not elsewhere classified: Secondary | ICD-10-CM | POA: Diagnosis not present

## 2016-08-23 DIAGNOSIS — I480 Paroxysmal atrial fibrillation: Secondary | ICD-10-CM | POA: Diagnosis not present

## 2016-08-23 DIAGNOSIS — I110 Hypertensive heart disease with heart failure: Secondary | ICD-10-CM | POA: Diagnosis not present

## 2016-08-23 DIAGNOSIS — R627 Adult failure to thrive: Secondary | ICD-10-CM | POA: Diagnosis not present

## 2016-08-23 DIAGNOSIS — N390 Urinary tract infection, site not specified: Secondary | ICD-10-CM | POA: Diagnosis not present

## 2016-08-23 DIAGNOSIS — I482 Chronic atrial fibrillation: Secondary | ICD-10-CM | POA: Diagnosis not present

## 2016-08-23 DIAGNOSIS — F1910 Other psychoactive substance abuse, uncomplicated: Secondary | ICD-10-CM | POA: Diagnosis not present

## 2016-08-23 DIAGNOSIS — M6281 Muscle weakness (generalized): Secondary | ICD-10-CM | POA: Diagnosis not present

## 2016-08-23 DIAGNOSIS — R262 Difficulty in walking, not elsewhere classified: Secondary | ICD-10-CM | POA: Diagnosis not present

## 2016-08-23 DIAGNOSIS — D649 Anemia, unspecified: Secondary | ICD-10-CM | POA: Diagnosis not present

## 2016-08-23 DIAGNOSIS — I429 Cardiomyopathy, unspecified: Secondary | ICD-10-CM | POA: Diagnosis not present

## 2016-08-23 DIAGNOSIS — D696 Thrombocytopenia, unspecified: Secondary | ICD-10-CM | POA: Diagnosis not present

## 2016-08-23 DIAGNOSIS — I82501 Chronic embolism and thrombosis of unspecified deep veins of right lower extremity: Secondary | ICD-10-CM | POA: Diagnosis not present

## 2016-08-23 LAB — BASIC METABOLIC PANEL
ANION GAP: 6 (ref 5–15)
BUN: 7 mg/dL (ref 6–20)
CALCIUM: 7.9 mg/dL — AB (ref 8.9–10.3)
CHLORIDE: 110 mmol/L (ref 101–111)
CO2: 22 mmol/L (ref 22–32)
Creatinine, Ser: 0.91 mg/dL (ref 0.61–1.24)
GFR calc Af Amer: >60 mL/min (ref >60)
GFR calc non Af Amer: >60 mL/min (ref >60)
GLUCOSE: 92 mg/dL (ref 65–99)
Potassium: 4.2 mmol/L (ref 3.5–5.1)
Sodium: 138 mmol/L (ref 135–145)

## 2016-08-23 LAB — MAGNESIUM: Magnesium: 1.4 mg/dL — ABNORMAL LOW (ref 1.7–2.4)

## 2016-08-23 MED ORDER — CEPHALEXIN 500 MG PO CAPS
500.0000 mg | ORAL_CAPSULE | Freq: Two times a day (BID) | ORAL | Status: DC
Start: 2016-08-23 — End: 2016-08-23
  Administered 2016-08-23: 500 mg via ORAL
  Filled 2016-08-23: qty 1

## 2016-08-23 MED ORDER — CEPHALEXIN 500 MG PO CAPS: 500.0000 mg | ORAL_CAPSULE | Freq: Two times a day (BID) | ORAL | 0 refills | Status: AC

## 2016-08-23 MED ORDER — THIAMINE HCL 100 MG PO TABS: 100.0000 mg | ORAL_TABLET | Freq: Every day | ORAL | 0 refills | Status: DC

## 2016-08-23 MED ORDER — MIDODRINE HCL 10 MG PO TABS: 10.0000 mg | ORAL_TABLET | Freq: Three times a day (TID) | ORAL | 0 refills | Status: AC

## 2016-08-23 NOTE — Discharge Summary (Signed)
Physician Discharge Summary  NOELL SHULAR VQQ:[id number] DOB: June 05, 1941 DOA: 08/20/2016  PCP: Thressa Sheller, MD  Admit date: 08/20/2016 Discharge date: 08/23/2016  Admitted From: Home Disposition:  SNF  Recommendations for Outpatient Follow-up:  1. Follow up with PCP in 1 week 2. Follow up with Dr. Harrington Challenger, cardiology, in 1 week  3. Finish your antibiotic as prescribed  4. Blood cultures are pending at time of discharge  Home Health: No  Equipment/Devices: None   Discharge Condition: Stable CODE STATUS: Full  Diet recommendation: heart healthy   Brief/Interim Summary: From H&P: Jaekwon G Tatumis an 75 y.o.malethe PMH of alcohol abuse, heroin abuse, alcoholic liver cirrhosis, hepatitis C, hypertension, GERD, hypothyroidism, depression, atrial fibrillation on chronic Eliquis therapy, history of DVT, chronic systolic CHF with an EF of 40-45 percent, medical nonadherence who was admitted 08/20/16 with a chief complaint of fall with injury to the left knee and back. Upon initial evaluation, the patient was noted to be mildly hypotensive with stabilization of blood pressure after a bolus of normal saline. He was tachycardic at 127 bpm. Changes of cellulitis noted to the left knee area. Patient was initially treated with IV vancomycin as well as Ancef. His left knee cellulitis continued to improve. X-ray of the knee as well as CRP and ESR have been negative. Due to his fall, CT head and x-ray of knee, x-ray of thoracic and lumbar spine were completed which were negative for acute injuries. Patient continued to work with PT and OT and recommended skilled nursing facility at discharge. Cardiology also evaluated patient, due to A. fib RVR. They recommended continuing Eliquis, metoprolol, holding spironolactone due to low BP. Patient was also started on midodrine 3 times daily for low BP. He should follow up with PCP as well as outpatient cardiology on discharge.   Subjective on day of discharge:  Feeling well, no complaints. States his left knee swelling and redness has improved greatly with antibiotics. He has been afebrile, denies any chest pain or shortness of breath. No nausea or vomiting.  Discharge Diagnoses:  Principal Problem:   Cellulitis of left knee Active Problems:   Alcohol abuse   Depressive disorder   Thrombocytopenia (HCC)   Essential hypertension   Atrial fibrillation with RVR (HCC)   Hypotension   Alcoholic cirrhosis of liver with ascites ([facility])   Fall   Back injury   Chronic systolic CHF (congestive heart failure) (HCC)   Failure to thrive in adult  Left knee cellulitis -Xray negative for fx, ESR/CRP normal -Blood cultures negative to date, follow up final results  -Ancef/vanco, will switch to oral keflex on discharge   Fall injury -CT head and xray of knee, thoracic/lumbar spine without acute injuries -PT/OT --> SNF   Alcohol abuse  -CIWA -Thiamine/folic acid supplementation  Depression -Continue amitriptyline  Essential HTN -Continue metoprolol, holding spironolactone due to low BP -Started on midodrine   A Fib RVR -On Eliquis, metoprolol   Alcoholic cirrhosis with ascites -Holding spironolactone   Chronic systolic heart failure -Continue metoprolol   Discharge Instructions  Discharge Instructions    Call MD for:  temperature >100.4    Complete by:  As directed    Diet - low sodium heart healthy    Complete by:  As directed    Increase activity slowly    Complete by:  As directed        Medication List    STOP taking these medications   spironolactone 25 MG tablet Commonly known as:  ALDACTONE  TAKE these medications   amitriptyline 10 MG tablet Commonly known as:  ELAVIL Take 1 tablet (10 mg total) by mouth at bedtime.   apixaban 5 MG Tabs tablet Commonly known as:  ELIQUIS Take 1 tablet (5 mg total) by mouth 2 (two) times daily.   cephALEXin 500 MG capsule Commonly known as:  KEFLEX Take 1 capsule  (500 mg total) by mouth 2 (two) times daily. What changed:  when to take this   folic acid 1 MG tablet Commonly known as:  FOLVITE Take 1 tablet (1 mg total) by mouth daily.   levothyroxine 50 MCG tablet Commonly known as:  SYNTHROID, LEVOTHROID Take 1 tablet (50 mcg total) by mouth daily before breakfast.   magnesium oxide 400 (241.3 Mg) MG tablet Commonly known as:  MAG-OX Take 1 tablet (400 mg total) by mouth daily.   metoprolol 50 MG tablet Commonly known as:  LOPRESSOR Take 1 tablet (50 mg total) by mouth 3 (three) times daily.   midodrine 10 MG tablet Commonly known as:  PROAMATINE Take 1 tablet (10 mg total) by mouth 3 (three) times daily with meals.   multivitamin with minerals Tabs tablet Take 1 tablet by mouth daily.   thiamine 100 MG tablet Take 1 tablet (100 mg total) by mouth daily. Start taking on:  08/24/2016   VITAMIN D PO Take 1 tablet by mouth daily.      Follow-up Information    MACKENZIE,BRIAN, MD. Schedule an appointment as soon as possible for a visit in 1 week(s).   Specialty:  Internal Medicine Contact information: [place], [place] [address] [phone]        [name], MD. Schedule an appointment as soon as possible for a visit in 1 week(s).   Specialty:  Cardiology Contact information: [place] [address] [phone]          No Known Allergies  Consultations:  Cardiology   Procedures/Studies: Dg Chest 2 View  Result Date: 08/12/2016 CLINICAL DATA:  Status post fall. EXAM: CHEST  2 VIEW COMPARISON:  None. FINDINGS: The heart size and mediastinal contours are within normal limits. Both lungs are clear. The visualized skeletal structures are unremarkable. IMPRESSION: No active cardiopulmonary disease. Electronically Signed   By: Kathreen Devoid   On: 08/12/2016 08:02   Dg Thoracic Spine 2 View  Result Date: 08/20/2016 CLINICAL DATA:  Status post two falls, with  lower back pain. Initial encounter. EXAM: THORACIC SPINE 2 VIEWS COMPARISON:  Chest radiograph performed 08/12/2016 FINDINGS: There is no evidence of fracture or subluxation. Vertebral bodies demonstrate normal height and alignment. Intervertebral disc spaces are preserved. The visualized portions of both lungs are clear. The mediastinum is unremarkable in appearance. IMPRESSION: No evidence of fracture or subluxation along the thoracic spine. Electronically Signed   By: Garald Balding M.D.   On: 08/20/2016 21:11   Dg Thoracic Spine 2 View  Result Date: 08/12/2016 CLINICAL DATA:  Fall. EXAM: THORACIC SPINE 2 VIEWS COMPARISON:  08/06/2016.  07/20/2016 FINDINGS: Thoracic spine scoliosis with diffuse degenerative change. Stable mild upper thoracic vertebral body compressions. No acute abnormality identified. IMPRESSION: 1. Stable mild upper thoracic vertebral body compressions. No acute abnormality. 2.  Thoracic spine scoliosis with diffuse degenerative change. Electronically Signed   By: Marcello Moores  Register   On: 08/12/2016 08:02   Dg Lumbar Spine 2-3 Views  Result Date: 08/20/2016 CLINICAL DATA:  Status post fall, with lower back pain. Initial encounter. EXAM: LUMBAR SPINE -  2-3 VIEW COMPARISON:  Lumbar spine radiographs performed 08/12/2016 FINDINGS: There is no evidence of fracture or subluxation. Vertebral bodies demonstrate normal height and alignment. Intervertebral disc spaces are preserved. Small anterior and posterior disc osteophyte complexes are noted along the lumbar spine. The visualized bowel gas pattern is unremarkable in appearance; air and stool are noted within the colon. The sacroiliac joints are within normal limits. Mild calcification is noted along the abdominal aorta. IMPRESSION: 1. No evidence of fracture or subluxation along the lumbar spine. 2. Minimal degenerative change along the lumbar spine. 3. Mild aortic atherosclerosis noted. Electronically Signed   By: Garald Balding M.D.   On:  08/20/2016 21:11   Dg Lumbar Spine Complete  Result Date: 08/12/2016 CLINICAL DATA:  Status post fall.  Back pain, neck pain EXAM: LUMBAR SPINE - COMPLETE 4+ VIEW COMPARISON:  None. FINDINGS: There are 5 nonrib bearing lumbar-type vertebral bodies. There is mild L5 vertebral body height loss. There is generalized osteopenia. There is a dextrocurvature of the lumbar spine. The alignment is anatomic. There is no static listhesis. There is no spondylolysis. There is degenerative disc disease with disc height loss at L1-2, L2-3, L3-4, L4-5. There is bilateral facet arthropathy L3-4, L4-5 and L5-S1. There are mild degenerative changes of bilateral sacroiliac joints. There is abdominal aortic atherosclerosis. IMPRESSION: Mild L5 vertebral body height loss of indeterminate age. Lumbar spine spondylosis. Aortic Atherosclerosis (ICD10-170.0) Electronically Signed   By: Kathreen Devoid   On: 08/12/2016 08:01   Ct Head Wo Contrast  Result Date: 08/20/2016 CLINICAL DATA:  Initial evaluation for acute trauma, fall. EXAM: CT HEAD WITHOUT CONTRAST TECHNIQUE: Contiguous axial images were obtained from the base of the skull through the vertex without intravenous contrast. COMPARISON:  Prior CT from 08/12/2016. FINDINGS: Brain: Scattered areas of encephalomalacia involving the bilateral cerebral hemispheres again seen, compatible with remote ischemic infarcts. Most notable of these within the right temporal region. Additional scattered remote lacunar infarcts within the right basal ganglia. Overall, these are stable from previous. Underlying cerebral atrophy with chronic microvascular ischemic disease also stable. No acute intracranial hemorrhage. No evidence for acute large vessel territory infarct. No mass lesion, midline shift, or mass effect. Ventricular prominence related to global parenchymal volume loss without hydrocephalus. No extra-axial fluid collection. Slight dural thickening overlying the left frontal convexity is  stable. Vascular: No hyperdense vessel. Scattered vascular calcifications noted within the carotid siphons. Skull: Scalp soft tissues demonstrate no acute abnormality. Calvarium intact. Sinuses/Orbits: Globes and orbital soft tissues within normal limits. Paranasal sinuses and mastoid air cells are clear. IMPRESSION: 1. No acute intracranial process. 2. 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 3. Stable underlying cerebral atrophy with chronic microvascular ischemic disease. Electronically Signed   By: Jeannine Boga M.D.   On: 08/20/2016 21:53   Ct Head Wo Contrast  Result Date: 08/12/2016 CLINICAL DATA:  Status post fall out of bed last night. EXAM: CT HEAD WITHOUT CONTRAST CT CERVICAL SPINE WITHOUT CONTRAST TECHNIQUE: Multidetector CT imaging of the head and cervical spine was performed following the standard protocol without intravenous contrast. Multiplanar CT image reconstructions of the cervical spine were also generated. COMPARISON:  Head CT scan 06/25/2016.  Brain MRI 06/26/2016. 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 No evidence of acute infarct, hemorrhage, mass lesion, midline shift or abnormal extra-axial fluid collection is  identified. No hydrocephalus or pneumocephalus. Vascular: Atherosclerotic vascular disease is identified. Skull: Intact. Sinuses/Orbits: Unremarkable. Other: None. CT CERVICAL SPINE FINDINGS Alignment: Normal. Skull base and vertebrae: No acute fracture. No primary bone lesion or focal pathologic process. Soft tissues and spinal canal: No prevertebral fluid or swelling. No visible canal hematoma. Disc levels: Intervertebral disc space height is maintained. Mild appearing bulge C3-4 noted. Upper chest: Negative. Other: None. IMPRESSION: No  acute abnormality head or cervical spine. 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 Negative cervical spine CT. Electronically Signed   By: Inge Rise M.D.   On: 08/12/2016 07:52   Ct Cervical Spine Wo Contrast  Result Date: 08/12/2016 CLINICAL DATA:  Status post fall out of bed last night. EXAM: CT HEAD WITHOUT CONTRAST CT CERVICAL SPINE WITHOUT CONTRAST TECHNIQUE: Multidetector CT imaging of the head and cervical spine was performed following the standard protocol without intravenous contrast. Multiplanar CT image reconstructions of the cervical spine were also generated. COMPARISON:  Head CT scan 06/25/2016.  Brain MRI 06/26/2016. 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 No evidence of acute infarct, hemorrhage, mass lesion, midline shift or abnormal extra-axial fluid collection is identified. No hydrocephalus or pneumocephalus. Vascular: Atherosclerotic vascular disease is identified. Skull: Intact. Sinuses/Orbits: Unremarkable. Other: None. CT CERVICAL SPINE FINDINGS Alignment: Normal. Skull base and vertebrae: No acute fracture. No primary bone lesion or focal pathologic process. Soft tissues and spinal canal: No prevertebral fluid or swelling. No visible canal hematoma. Disc levels: Intervertebral disc space height is maintained. Mild appearing bulge C3-4 noted. Upper chest: Negative. Other: None. IMPRESSION: No acute abnormality head or cervical spine. 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 Negative cervical spine CT. Electronically Signed   By: Inge Rise M.D.   On: 08/12/2016 07:52   Dg Chest Port 1 View  Result Date: 08/06/2016 CLINICAL DATA:   75 year old hypertensive male with tachycardia. Initial encounter. EXAM: PORTABLE CHEST 1 VIEW COMPARISON:  07/20/2016. FINDINGS: No infiltrate, congestive heart failure or pneumothorax. No plain film evidence of pulmonary malignancy. Heart size within normal limits. Calcified aorta. IMPRESSION: No active disease. Electronically Signed   By: Genia Del M.D.   On: 08/06/2016 15:38   Dg Knee Complete 4 Views Left  Result Date: 08/20/2016 CLINICAL DATA:  Left knee pain, swelling and increased warmth after falling onto the knee 3 days ago. EXAM: LEFT KNEE - COMPLETE 4+ VIEW COMPARISON:  None. FINDINGS: Mild diffuse subcutaneous edema. Diffuse osteopenia. No fracture, dislocation or effusion seen. Anterior patellar spurs are noted. IMPRESSION: No fracture or effusion. Diffuse osteopenia and mild diffuse subcutaneous edema. Electronically Signed   By: Claudie Revering M.D.   On: 08/20/2016 14:29      Discharge Exam: Vitals:   08/22/16 2116 08/23/16 0504  BP: (!) 122/94 (!) 115/93  Pulse: 79 90  Resp: 15 18  Temp: 98 F (36.7 C) 97.6 F (36.4 C)   Vitals:   08/22/16 1200 08/22/16 1414 08/22/16 2116 08/23/16 0504  BP: 99/61 109/72 (!) 122/94 (!) 115/93  Pulse: 72 (!) 152 79 90  Resp: 14 18 15 18   Temp:  98.2 F (36.8 C) 98 F (36.7 C) 97.6 F (36.4 C)  TempSrc:    Oral  SpO2: 100% Marland Kitchen)  85%  96%  Weight:    91.8 kg (202 lb 4.8 oz)  Height:        General: Pt is alert, awake, not in acute distress Cardiovascular: irreg rhythm, rate controlled, S1/S2 +, no rubs, no gallops Respiratory: CTA bilaterally, no wheezing, no rhonchi Abdominal: Soft, NT, ND, bowel sounds + Extremities: no edema, no cyanosis, left knee without erythema or edema     The results of significant diagnostics from this hospitalization (including imaging, microbiology, ancillary and laboratory) are listed below for reference.     Microbiology: Recent Results (from the past 240 hour(s))  Culture, blood (routine x  2)     Status: None (Preliminary result)   Collection Time: 08/20/16  8:25 PM  Result Value Ref Range Status   Specimen Description BLOOD RIGHT FOREARM  Final   Special Requests BOTTLES DRAWN AEROBIC ONLY 5CC  Final   Culture NO GROWTH 2 DAYS  Final   Report Status PENDING  Incomplete  Culture, blood (routine x 2)     Status: None (Preliminary result)   Collection Time: 08/20/16  8:30 PM  Result Value Ref Range Status   Specimen Description BLOOD RIGHT HAND  Final   Special Requests BOTTLES DRAWN AEROBIC AND ANAEROBIC 5CC  Final   Culture NO GROWTH 2 DAYS  Final   Report Status PENDING  Incomplete  Urine culture     Status: None   Collection Time: 08/20/16 11:16 PM  Result Value Ref Range Status   Specimen Description URINE, CLEAN CATCH  Final   Special Requests NONE  Final   Culture NO GROWTH  Final   Report Status 08/22/2016 FINAL  Final  MRSA PCR Screening     Status: None   Collection Time: 08/21/16  4:33 AM  Result Value Ref Range Status   MRSA by PCR NEGATIVE NEGATIVE Final    Comment:        The GeneXpert MRSA Assay (FDA approved for NASAL specimens only), is one component of a comprehensive MRSA colonization surveillance program. It is not intended to diagnose MRSA infection nor to guide or monitor treatment for MRSA infections.      Labs: BNP (last 3 results)  Recent Labs  04/29/16 0305 06/09/16 1500 08/20/16 2041  BNP 413.5* 463.2* 292.4*   Basic Metabolic Panel:  Recent Labs Lab 08/20/16 1517 08/21/16 0617 08/22/16 0500 08/23/16 0653  NA 143 142 140 138  K 3.9 3.1* 4.4 4.2  CL 111 114* 112* 110  CO2 22 22 20* 22  GLUCOSE 99 96 84 92  BUN 8 10 10 7   CREATININE 0.87 0.79 0.87 0.91  CALCIUM 8.2* 7.4* 7.7* 7.9*  MG  --   --  1.3* 1.4*   Liver Function Tests:  Recent Labs Lab 08/20/16 1517  AST 41  ALT 17  ALKPHOS 70  BILITOT 1.0  PROT 6.4*  ALBUMIN 2.4*   No results for input(s): LIPASE, AMYLASE in the last 168 hours. No results  for input(s): AMMONIA in the last 168 hours. CBC:  Recent Labs Lab 08/20/16 1517 08/21/16 0617 08/22/16 0500  WBC 4.2 3.7* 4.5  NEUTROABS 1.6*  --   --   HGB 12.7* 11.8* 12.1*  HCT 38.4* 35.4* 36.9*  MCV 87.7 88.7 89.6  PLT 139* 122* 135*   Cardiac Enzymes: No results for input(s): CKTOTAL, CKMB, CKMBINDEX, TROPONINI in the last 168 hours. BNP: Invalid input(s): POCBNP CBG: No results for input(s): GLUCAP in the last 168 hours. D-Dimer No results for  input(s): DDIMER in the last 72 hours. Hgb A1c No results for input(s): HGBA1C in the last 72 hours. Lipid Profile No results for input(s): CHOL, HDL, LDLCALC, TRIG, CHOLHDL, LDLDIRECT in the last 72 hours. Thyroid function studies No results for input(s): TSH, T4TOTAL, T3FREE, THYROIDAB in the last 72 hours.  Invalid input(s): FREET3 Anemia work up No results for input(s): VITAMINB12, FOLATE, FERRITIN, TIBC, IRON, RETICCTPCT in the last 72 hours. Urinalysis    Component Value Date/Time   COLORURINE ORANGE (A) 08/20/2016 1618   APPEARANCEUR CLEAR 08/20/2016 1618   LABSPEC 1.022 08/20/2016 1618   PHURINE 6.0 08/20/2016 1618   GLUCOSEU NEGATIVE 08/20/2016 1618   HGBUR NEGATIVE 08/20/2016 1618   BILIRUBINUR SMALL (A) 08/20/2016 1618   KETONESUR 15 (A) 08/20/2016 1618   PROTEINUR 30 (A) 08/20/2016 1618   UROBILINOGEN 2.0 (H) 12/13/2014 1256   NITRITE NEGATIVE 08/20/2016 1618   LEUKOCYTESUR SMALL (A) 08/20/2016 1618   Sepsis Labs Invalid input(s): PROCALCITONIN,  WBC,  LACTICIDVEN Microbiology Recent Results (from the past 240 hour(s))  Culture, blood (routine x 2)     Status: None (Preliminary result)   Collection Time: 08/20/16  8:25 PM  Result Value Ref Range Status   Specimen Description BLOOD RIGHT FOREARM  Final   Special Requests BOTTLES DRAWN AEROBIC ONLY 5CC  Final   Culture NO GROWTH 2 DAYS  Final   Report Status PENDING  Incomplete  Culture, blood (routine x 2)     Status: None (Preliminary result)    Collection Time: 08/20/16  8:30 PM  Result Value Ref Range Status   Specimen Description BLOOD RIGHT HAND  Final   Special Requests BOTTLES DRAWN AEROBIC AND ANAEROBIC 5CC  Final   Culture NO GROWTH 2 DAYS  Final   Report Status PENDING  Incomplete  Urine culture     Status: None   Collection Time: 08/20/16 11:16 PM  Result Value Ref Range Status   Specimen Description URINE, CLEAN CATCH  Final   Special Requests NONE  Final   Culture NO GROWTH  Final   Report Status 08/22/2016 FINAL  Final  MRSA PCR Screening     Status: None   Collection Time: 08/21/16  4:33 AM  Result Value Ref Range Status   MRSA by PCR NEGATIVE NEGATIVE Final    Comment:        The GeneXpert MRSA Assay (FDA approved for NASAL specimens only), is one component of a comprehensive MRSA colonization surveillance program. It is not intended to diagnose MRSA infection nor to guide or monitor treatment for MRSA infections.      Time coordinating discharge: Over 30 minutes  SIGNED:  Dessa Phi, DO [facility] Pager [phone]  If 7PM-7AM, please contact night-coverage [email or web address] Password [email or web address] 08/23/2016, 9:31 AM

## 2016-08-23 NOTE — Clinical Social Work Placement (Signed)
   CLINICAL SOCIAL WORK PLACEMENT  NOTE  Date:  08/23/2016  Patient Details  Name: Nathan Frank MRN: [id number] Date of Birth: 05/29/1941  Clinical Social Work is seeking post-discharge placement for this patient at the [place] level of care (*CSW will initial, date and re-position this form in  chart as items are completed):  Yes   Patient/family provided with [facility] Work Department's list of facilities offering this level of care within the geographic area requested by the patient (or if unable, by the patient's family).  Yes   Patient/family informed of their freedom to choose among providers that offer the needed level of care, that participate in Medicare, Medicaid or managed care program needed by the patient, have an available bed and are willing to accept the patient.  Yes   Patient/family informed of 's ownership interest in [facility] and [facility], as well as of the fact that they are under no obligation to receive care at these facilities.  PASRR submitted to EDS on       PASRR number received on       Existing PASRR number confirmed on 08/23/16 (Confirmed by Wray Kearns at [facility])     [place] transmitted to all facilities in geographic area requested by pt/family on 08/23/16     FL2 transmitted to all facilities within larger geographic area on       Patient informed that his/her managed care company has contracts with or will negotiate with certain facilities, including the following:        Yes   Patient/family informed of bed offers received.  Patient chooses bed at [place] recommends and patient chooses bed at      Patient to be transferred to [facility] on 08/23/16.  Patient to be transferred to facility by Ambulance     Patient family notified on 08/23/16 of transfer.  Name of family member notified:  Multiple messages left with wife. Rhonda  with facility states that paperwork done with wife and that the wife is aware of transfer.     PHYSICIAN Please prepare priority discharge summary, including medications, Please prepare prescriptions, Please sign FL2     Additional Comment:    _______________________________________________ Rigoberto Noel, LCSW 08/23/2016, 3:42 PM

## 2016-08-23 NOTE — Progress Notes (Signed)
Nsg Discharge Note  Admit Date:  08/20/2016 Discharge date: 08/23/2016   Nathan Frank to be D/C'd Skilled nursing facility per MD order.  AVS completed.  Copy for chart, and copy for patient signed, and dated. Patient/caregiver able to verbalize understanding.  Discharge Medication:   Medication List    STOP taking these medications   spironolactone 25 MG tablet Commonly known as:  ALDACTONE     TAKE these medications   amitriptyline 10 MG tablet Commonly known as:  ELAVIL Take 1 tablet (10 mg total) by mouth at bedtime.   apixaban 5 MG Tabs tablet Commonly known as:  ELIQUIS Take 1 tablet (5 mg total) by mouth 2 (two) times daily.   cephALEXin 500 MG capsule Commonly known as:  KEFLEX Take 1 capsule (500 mg total) by mouth 2 (two) times daily. What changed:  when to take this   folic acid 1 MG tablet Commonly known as:  FOLVITE Take 1 tablet (1 mg total) by mouth daily.   levothyroxine 50 MCG tablet Commonly known as:  SYNTHROID, LEVOTHROID Take 1 tablet (50 mcg total) by mouth daily before breakfast.   magnesium oxide 400 (241.3 Mg) MG tablet Commonly known as:  MAG-OX Take 1 tablet (400 mg total) by mouth daily.   metoprolol 50 MG tablet Commonly known as:  LOPRESSOR Take 1 tablet (50 mg total) by mouth 3 (three) times daily.   midodrine 10 MG tablet Commonly known as:  PROAMATINE Take 1 tablet (10 mg total) by mouth 3 (three) times daily with meals.   multivitamin with minerals Tabs tablet Take 1 tablet by mouth daily.   thiamine 100 MG tablet Take 1 tablet (100 mg total) by mouth daily. Start taking on:  08/24/2016   VITAMIN D PO Take 1 tablet by mouth daily.       Discharge Assessment: Vitals:   08/23/16 0504 08/23/16 1444  BP: (!) 115/93 113/70  Pulse: 90 90  Resp: 18 16  Temp: 97.6 F (36.4 C)    Skin clean, dry and intact without evidence of skin break down, no evidence of skin tears noted. IV catheter discontinued intact. Site  without signs and symptoms of complications - no redness or edema noted at insertion site, patient denies c/o pain - only slight tenderness at site.  Dressing with slight pressure applied.  D/c Instructions-Education: Discharge instructions given to patient/family with verbalized understanding. D/c education completed with patient/family including follow up instructions, medication list, d/c activities limitations if indicated, with other d/c instructions as indicated by MD - patient able to verbalize understanding, all questions fully answered. Patient instructed to return to ED, call 911, or call MD for any changes in condition.  Patient escorted via [place], and D/C home via private auto.  Salley Slaughter, RN 08/23/2016 4:27 PM  Nsg Discharge Note  Admit Date:  08/20/2016 Discharge date: 08/23/2016   Nathan Frank to be D/C'd Skilled nursing facility per MD order.  AVS completed.  Copy for chart, and copy for patient signed, and dated. Patient/caregiver able to verbalize understanding.  Discharge Medication:   Medication List    STOP taking these medications   spironolactone 25 MG tablet Commonly known as:  ALDACTONE     TAKE these medications   amitriptyline 10 MG tablet Commonly known as:  ELAVIL Take 1 tablet (10 mg total) by mouth at bedtime.   apixaban 5 MG Tabs tablet Commonly known as:  ELIQUIS Take 1 tablet (5 mg total) by mouth 2 (  two) times daily.   cephALEXin 500 MG capsule Commonly known as:  KEFLEX Take 1 capsule (500 mg total) by mouth 2 (two) times daily. What changed:  when to take this   folic acid 1 MG tablet Commonly known as:  FOLVITE Take 1 tablet (1 mg total) by mouth daily.   levothyroxine 50 MCG tablet Commonly known as:  SYNTHROID, LEVOTHROID Take 1 tablet (50 mcg total) by mouth daily before breakfast.   magnesium oxide 400 (241.3 Mg) MG tablet Commonly known as:  MAG-OX Take 1 tablet (400 mg total) by mouth daily.   metoprolol 50 MG  tablet Commonly known as:  LOPRESSOR Take 1 tablet (50 mg total) by mouth 3 (three) times daily.   midodrine 10 MG tablet Commonly known as:  PROAMATINE Take 1 tablet (10 mg total) by mouth 3 (three) times daily with meals.   multivitamin with minerals Tabs tablet Take 1 tablet by mouth daily.   thiamine 100 MG tablet Take 1 tablet (100 mg total) by mouth daily. Start taking on:  08/24/2016   VITAMIN D PO Take 1 tablet by mouth daily.       Discharge Assessment: Vitals:   08/23/16 0504 08/23/16 1444  BP: (!) 115/93 113/70  Pulse: 90 90  Resp: 18 16  Temp: 97.6 F (36.4 C)    Skin clean, dry and intact without evidence of skin break down, no evidence of skin tears noted. IV catheter discontinued intact. Site without signs and symptoms of complications - no redness or edema noted at insertion site, patient denies c/o pain - only slight tenderness at site.  Dressing with slight pressure applied.  D/c Instructions-Education: Discharge instructions given to patient/family with verbalized understanding. D/c education completed with patient/family including follow up instructions, medication list, d/c activities limitations if indicated, with other d/c instructions as indicated by MD - patient able to verbalize understanding, all questions fully answered. Patient instructed to return to ED, call 911, or call MD for any changes in condition.  Report given to nurse at [facility] before pt discharge. Pt transported via [facility].  Salley Slaughter, RN 08/23/2016 4:27 PM

## 2016-08-23 NOTE — NC FL2 (Signed)
[place] LEVEL OF CARE SCREENING TOOL     IDENTIFICATION  Patient Name: Nathan Frank Birthdate: June 11, 1941 Sex: male Admission Date (Current Location): 08/20/2016  [facility] and [place] Number:  Herbalist and Address:  [facility] [place] [place] [address] [place], [place] [address]      Provider Number: [id number]  Attending Physician Name and Address:  Shon Millet*  Relative Name and Phone Number:       Current Level of Care: Hospital Recommended Level of Care: [place] Prior Approval Number:    Date Approved/Denied:   PASRR Number:    Discharge Plan: SNF    Current Diagnoses: Patient Active Problem List   Diagnosis Date Noted  . Failure to thrive in adult   . Fall 08/20/2016  . Cellulitis of left knee 08/20/2016  . Back injury 08/20/2016  . Chronic systolic CHF (congestive heart failure) ([place]) 08/20/2016  . A-fib ([place]) 08/06/2016  . Atrial fibrillation with rapid ventricular response ([place]) 08/06/2016  . Acute blood loss anemia   . Lymphocytosis   . Atrial tachycardia ([place])   . Acute ischemic right MCA stroke ([place]) 07/04/2016  . Obesity 07/01/2016  . Family hx-stroke 07/01/2016  . Dysphagia, post-stroke   . Dysarthria, post-stroke   . Polysubstance abuse   . Chronic combined systolic and diastolic CHF (congestive heart failure) ([place])   . Mitral valve regurgitation   . Anemia of chronic disease   . 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 Acute on chronic diastolic CHF (congestive heart failure) ([place]) 06/09/2016  . Alcoholic cirrhosis of liver with ascites ([place])   . Other specified hypothyroidism   . Hepatocellular carcinoma ([place])   . Alcohol withdrawal ([place]) 05/27/2016  . Generalized weakness 05/27/2016  . Alcohol use disorder, moderate, in controlled environment, dependence ([place])   . Atrial fibrillation with  RVR ([place]) 04/18/2016  . Chest pain 04/18/2016  . Hypotension 04/18/2016  . Chronic atrial fibrillation ([place]) 04/18/2016  . Prolonged Q-T interval on ECG   . Neuropathy ([place])   . Essential hypertension   . Thrombocytopenia ([place]) 04/16/2015  . Abnormal TSH 04/16/2015  . NSTEMI (non-ST elevated myocardial infarction) ([place])   . Acute gastritis with hemorrhage   . GI bleed due to NSAIDs 12/13/2014  . Upper GI bleeding 12/13/2014  . Alcohol dependence ([place]) 02/02/2014  . Depressive disorder 02/01/2014  . Symptomatic cholelithiasis 12/15/2013  . DVT of lower limb, acute ([place]) 06/06/2012  . Alcohol abuse 06/04/2012  . Liver cirrhosis ([place]) 06/04/2012    Orientation RESPIRATION BLADDER Height & Weight     Self, Time, Situation, Place  Normal Incontinent Weight: 91.8 kg (202 lb 4.8 oz) Height:  5\' 11"  (180.3 cm)  BEHAVIORAL SYMPTOMS/MOOD NEUROLOGICAL BOWEL NUTRITION STATUS   (NONE)  (NONE) Continent Diet (Heart Healthy)  AMBULATORY STATUS COMMUNICATION OF NEEDS Skin   Extensive Assist Verbally Normal                       Personal Care Assistance Level of Assistance  Bathing, Feeding, Dressing Bathing Assistance: Limited assistance Feeding assistance: Independent Dressing Assistance: Limited assistance     Functional Limitations Info  Sight, Hearing, Speech Sight Info: Adequate Hearing Info: Adequate Speech Info: Adequate    SPECIAL CARE FACTORS FREQUENCY  PT (By licensed PT), OT (By licensed OT)     PT Frequency: 5/week OT Frequency: 5/week  Contractures Contractures Info: Not present    Additional Factors Info  Code Status, Allergies Code Status Info: Full Code Allergies Info: NKDA           Current Medications (08/23/2016):  This is the current hospital active medication list Current Facility-Administered Medications  Medication Dose Route Frequency Provider Last Rate Last Dose  . amitriptyline (ELAVIL) tablet 10 mg  10 mg Oral QHS Ivor Costa, MD   10 mg at 08/22/16 2056  . apixaban (ELIQUIS) tablet 5 mg  5 mg Oral BID Ivor Costa, MD   5 mg at 08/23/16 [id number]  . cephALEXin (KEFLEX) capsule 500 mg  500 mg Oral Q12H Jennifer Chahn-Yang Choi, DO   500 mg at 08/23/16 1046  . cholecalciferol (VITAMIN D) tablet 1,000 Units  1,000 Units Oral Daily Ivor Costa, MD   1,000 Units at 08/23/16 [phone]  . folic acid (FOLVITE) tablet 1 mg  1 mg Oral Daily Ivor Costa, MD   1 mg at 08/23/16 0813  . levothyroxine (SYNTHROID, LEVOTHROID) tablet 50 mcg  50 mcg Oral QAC breakfast Ivor Costa, MD   50 mcg at 08/23/16 [phone]  . LORazepam (ATIVAN) tablet 1 mg  1 mg Oral Q6H PRN Ivor Costa, MD      . magnesium oxide (MAG-OX) tablet 400 mg  400 mg Oral Daily Ivor Costa, MD   400 mg at 08/23/16 0814  . metoprolol tartrate (LOPRESSOR) tablet 50 mg  50 mg Oral TID Ivor Costa, MD   50 mg at 08/23/16 0813  . midodrine (PROAMATINE) tablet 10 mg  10 mg Oral TID WC Ivor Costa, MD   10 mg at 08/23/16 1246  . multivitamin with minerals tablet 1 tablet  1 tablet Oral Daily Ivor Costa, MD   1 tablet at 08/23/16 [phone]  . oxyCODONE (Oxy IR/ROXICODONE) immediate release tablet 5 mg  5 mg Oral Q4H PRN Ivor Costa, MD   5 mg at 08/22/16 2144  . thiamine (VITAMIN B-1) tablet 100 mg  100 mg Oral Daily Ivor Costa, MD   100 mg at 08/23/16 [id number]   Or  . thiamine (B-1) injection 100 mg  100 mg Intravenous Daily Ivor Costa, MD   100 mg at 08/20/16 2151  . zolpidem (AMBIEN) tablet 5 mg  5 mg Oral QHS PRN Ivor Costa, MD   5 mg at 08/22/16 2144     Discharge Medications: Please see discharge summary for a list of discharge medications.  Relevant Imaging Results:  Relevant Lab Results:   Additional Information SSN:  [id number]  Rigoberto Noel, LCSW

## 2016-08-23 NOTE — Discharge Instructions (Signed)

## 2016-08-24 ENCOUNTER — Non-Acute Institutional Stay (SKILLED_NURSING_FACILITY): Payer: Medicare Other | Admitting: Internal Medicine

## 2016-08-24 ENCOUNTER — Other Ambulatory Visit: Payer: Self-pay | Admitting: *Deleted

## 2016-08-24 ENCOUNTER — Encounter: Payer: Self-pay | Admitting: *Deleted

## 2016-08-24 ENCOUNTER — Encounter: Payer: Self-pay | Admitting: Internal Medicine

## 2016-08-24 DIAGNOSIS — I482 Chronic atrial fibrillation, unspecified: Secondary | ICD-10-CM

## 2016-08-24 DIAGNOSIS — M545 Low back pain, unspecified: Secondary | ICD-10-CM

## 2016-08-24 DIAGNOSIS — F1910 Other psychoactive substance abuse, uncomplicated: Secondary | ICD-10-CM | POA: Diagnosis not present

## 2016-08-24 DIAGNOSIS — L03116 Cellulitis of left lower limb: Secondary | ICD-10-CM

## 2016-08-24 NOTE — Assessment & Plan Note (Signed)
Focus is rate control, avoid hypotension because of risk of recurrent fall

## 2016-08-24 NOTE — Patient Instructions (Signed)
See Current Assessment & Plan in Problem List under specific Diagnosis Because of significantly increased risk as determined by the opioid-overdose calculator; controlled substances should NOT be called in by the on-call provider for this patient. For any breakthrough pain Tylenol NOT to exceed 2 g per day; topical anti-inflammatory agent; short course (less than 3 days) Meloxicamm. or Celebrex could be considered if chart documents these as options.

## 2016-08-24 NOTE — Patient Outreach (Signed)
[facility]) Care Management  08/24/2016  ZYMIER MATSUURA 1941/01/21 [id number]   Noted that member was discharged to [facility] on 11/19.  Hospital liaison notified.  Referral placed for LCSW to follow at facility.  This care manager will initiate transition of care program once discharged.  [name], [place], MSN [employer] [phone]

## 2016-08-24 NOTE — Assessment & Plan Note (Signed)
No narcotics or nonsteroidal agents will be called in by on call care providers Tylenol potentially a risk with chronic liver disease Back pain will be treated with topical nonsteroidal, Meloxicam or Celebrex short term (< 7 days)

## 2016-08-24 NOTE — Assessment & Plan Note (Addendum)
Continue Keflex through 08/29/16

## 2016-08-24 NOTE — Progress Notes (Signed)
This is a comprehensive admission note to [facility] performed on this date less than 30 days from date of admission. Included are preadmission medical/surgical history;reconciled medication list; family history; social history and comprehensive review of systems.  Corrections and additions to the records were documented . Comprehensive physical exam was also performed. Additionally a clinical summary was entered for each active diagnosis pertinent to this admission in the Problem List to enhance continuity of care.  PCP: Thressa Sheller, M.D., [address]... Phone [phone].  HPI: The patient was hospitalized 11/16-11/19/17 with cellulitis of the left knee in the context of a fall. There was no cardiac or neurologic prodrome prior to the fall. He had been up ambulating for possibly 30 minutes using his rolling walker. One of the wheels slipped on the wooden floor and he fell striking his knee and back. He reported this to his cardiologist at a follow-up visit and was sent to the emergency room.  Initially he was found to be mildly hypotensive which was also normal saline. He also exhibited tachycardia with a heart rate of 127. He received vancomycin and Ancef initially with improvement .C reactive protein and ESR have been negative. He also described back pain;CT of the head and thoracic/lumbar spine were negative for acute injuries. PT/OT recommended outpatient therapy Cardiology saw him for a fib with rapid ventricular response. Oral anticoagulant & metoprolol, were continued. Spironolactone was held due to low blood pressure.  Midodrine was Rxed 3 times daily for low blood pressure Because of history of alcohol abuse thiamine and folic acid supplementations were continued. Amitriptyline was continued for depression at a low-dose of 10 mg daily.  Past medical and surgical history: Past history is incredibly long and complicated. He has history of alcoholism for which  he underwent detoxification. 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 He has had a non-ST elevated myocardial infarction and has chronic nonischemic cardiomyopathy. He also has peripheral neuropathy. He has history of hepatitis C as well as hepatocellular carcinoma. He has had GI bleeds related to nonsteroidals. This is in the context of chronic GERD and gastritis and prior stroke ulcer. 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 Surgeries include percutaneous arterial thrombectomy in September of this year. He had a cardiac catheterization in July 2016.  Social history: Reviewed  Family history: Reviewed  Review of systems: His main complaint at this time is right lumbar sacral area pain. This is described as throbbing with some radiation superiorly. There are no associated neurologic symptoms. He has had some difficulty urinating over the last several weeks he is concerned about possible prostate disease. His brother had prostate cancer. Stools have been soft. He denies any other GI symptoms despite the complicated complicated past history.  Constitutional: No fever,significant weight change  Eyes: No redness, discharge, pain, vision change ENT/mouth: No nasal congestion,  purulent discharge, earache,change in hearing ,sore throat  Cardiovascular: No chest pain, paroxysmal nocturnal dyspnea, claudication, edema  Respiratory: No cough, sputum production,hemoptysis, DOE , significant snoring,apnea   Gastrointestinal: No heartburn,dysphagia,abdominal pain, nausea / vomiting,rectal bleeding, melena Genitourinary: No hematuria, pyuria,  incontinence, nocturia Musculoskeletal: No joint stiffness, joint swelling, weakness Dermatologic: No rash, pruritus, change in appearance of skin Neurologic: No dizziness,headache,syncope, seizures, numbness , tingling Endocrine: No  change in hair/skin/ nails, excessive thirst, excessive hunger, excessive urination  Hematologic/lymphatic: No significant bruising, lymphadenopathy,abnormal bleeding Allergy/immunology:  No itchy/ watery eyes, significant sneezing, urticaria, angioedema  Physical exam:  Pertinent or positive findings: he appears debilitated & infirm. It required 2 people to help get him off the toilet seat. Slight ptosis is present on the left. He has a full beard and mustache. Arcus senilis is present. The maxilla is edentulous. The lower teeth are in very poor hygiene and repair. He has minor scattered rales; no increased work of breathing. Heart rhythm is irregular and slightly increased.  He has trace ankle edema. Pedal pulses are decreased particularly the posterior tibial pulses.   General appearance: no acute distress , increased work of breathing is present.   Lymphatic: No lymphadenopathy about the head, neck, axilla . Eyes: No conjunctival inflammation or lid edema is present. There is no scleral icterus. Ears:  External ear exam shows no significant lesions or deformities.   Nose:  External nasal examination shows no deformity or inflammation. Nasal mucosa are pink and moist without lesions ,exudates Oral exam: lips and gums are healthy appearing.There is no oropharyngeal erythema or exudate . Neck:  No thyromegaly, masses, tenderness noted.    Heart:  No gallop, murmur, click, rub .  Abdomen:Bowel sounds are normal. Abdomen is soft and nontender with no organomegaly, hernias,masses. GU: deferred . Extremities:  No cyanosis, clubbing  Neurologic exam : Strength equal  in upper & lower extremities but decreased Balance,Rhomberg,finger to nose testing could not be completed due to clinical state Skin: Warm & dry w/o tenting. No significant lesions or rash.  See clinical summary under each active problem in the Problem List with associated updated therapeutic plan

## 2016-08-25 LAB — CULTURE, BLOOD (ROUTINE X 2)
CULTURE: NO GROWTH
CULTURE: NO GROWTH

## 2016-08-26 ENCOUNTER — Institutional Professional Consult (permissible substitution): Payer: Self-pay | Admitting: Internal Medicine

## 2016-08-31 ENCOUNTER — Encounter: Payer: Self-pay | Admitting: *Deleted

## 2016-08-31 ENCOUNTER — Other Ambulatory Visit: Payer: Self-pay | Admitting: *Deleted

## 2016-08-31 NOTE — Patient Outreach (Signed)
[facility]) Care Management  08/31/2016  Nathan Frank [birth year] [id number]  CSW was able to make contact with patient today to perform the initial assessment, as well as assess and assist with social work needs and services, when [place] met with patient at [facility] [place] where patient currently resides to receive short-term rehabilitative services.  CSW introduced self, explained role and types of services provided through [place] [facility] ([place] [facility]).  CSW further explained to patient that CSW works with patient's RNCM, also with [place] [facility], [name]. CSW then explained the reason for the visit, indicating that Mrs. Nathan Frank thought that patient would benefit from social work services and resources to assist with possible discharge planning needs from skilled nursing facility.  CSW obtained two HIPAA compliant identifiers from patient, which included patient's name and date of birth. Patient complained to CSW about experiencing a great deal of pain, needing CSW to speak with his attending nurse at [facility] about obtaining narcotics.  After thorough review of patient's chart, CSW noted that patient has a significant history of Alcohol Abuse/Dependence, Alcohol Cirrhosis of Liver with Ascites, Polysubstance Abuse and Depressive Disorder.  CSW briefly broached this subject with patient, inquiring as to whether or not patient would be interested in treatment, or a referral for services, but patient adamantly denied. CSW was able to converse with patient's nurse to express patient's concerns about better pain management.  CSW agreed to continue to follow patient while residing at the skilled facility, with a follow-up visit scheduled for December 11th, 2017. Nathan Frank, BSW, MSW, LCSW  Licensed Education officer, environmental Health System  Mailing  [place] [address], [place], [place] [address] Physical Address-[address], [place], [place] [address] Toll Free Main # [phone] Fax # [phone] Cell # [phone]  Fax # [phone]  [email or web address]

## 2016-09-03 ENCOUNTER — Other Ambulatory Visit: Payer: Self-pay | Admitting: *Deleted

## 2016-09-03 NOTE — Patient Outreach (Signed)
[facility]) Care Management  09/03/2016  IDELL MATTHEY Jun 21, 1941 [id number]   Communication with staff at facility, patient left against medical advice 09/01/16.  RNCM will notify [facility] LCSW and [facility] RNCM of patient status.  This RNCM will sign off case. Royetta Crochet. Laymond Purser, RN, BSN, [place] Acute Care Coordinator [phone]

## 2016-09-04 ENCOUNTER — Other Ambulatory Visit: Payer: Self-pay | Admitting: *Deleted

## 2016-09-04 NOTE — Patient Outreach (Signed)
[facility]) Care Management  09/04/2016  Nathan Frank 1941/04/03 [id number]   Notified by post acute care coordination, M. Niemczura, that member left facility AMA.  Call placed to member to initiate transition of care program, no answer.  HIPAA compliant voice message left.  Will await call back.  If no call back, will follow up next week.  [name], [place], MSN [name] [phone]

## 2016-09-07 ENCOUNTER — Other Ambulatory Visit: Payer: Self-pay | Admitting: *Deleted

## 2016-09-07 NOTE — Patient Outreach (Signed)
[facility] [facility]) Care Management  09/07/2016  GHASSAN NILE [birth year] [id number]   2nd attempt made to contact member to initiate transition of care program, unsuccessful.  HIPAA compliant voice message left.  Will await call back, if no call back will follow up tomorrow for 3rd attempt.  [name], [place], MSN [place] [phone]

## 2016-09-08 ENCOUNTER — Encounter: Payer: Self-pay | Admitting: *Deleted

## 2016-09-08 ENCOUNTER — Other Ambulatory Visit: Payer: Self-pay | Admitting: *Deleted

## 2016-09-08 NOTE — Patient Outreach (Signed)
[facility]) Care Management  09/08/2016  GEORDIE HAKE May 06, 1941 [id number]   3rd attempt to contact member since notification of leaving facility AMA, unsuccessful.  HIPAA compliant voice message left.  Will send outreach letter, if no response in 10 days will close case.  Will notify LCSW as appointment is scheduled to follow up at facility next week.  [name], [place], MSN [place] [phone]

## 2016-09-14 ENCOUNTER — Other Ambulatory Visit: Payer: Self-pay | Admitting: *Deleted

## 2016-09-14 NOTE — Patient Outreach (Signed)
[facility]) Care Management  09/14/2016  KEYDON MARCO [birth year] [id number]   CSW received an email message, as well as an [place] message from [place], [facility] with [place] [facility], indicating that patient left [facility] where patient was residing to receive short-term rehabilitative services, on 11/28, against medical advice.  [name] further reported that she has made three outreach attempts to patient, without success.  [name] has mailed an unsuccessful outreach attempt letter to patient's home, encouraging patient to contact Mrs. Lane within the next ten business days, if patient is interested in receiving community case management services.  Otherwise, [name] will proceed with case closure.  CSW will close patient's case, unless otherwise indicated by patient and/or Mrs. Lane. Nat Christen, BSW, MSW, LCSW  Licensed Education officer, environmental Health System  Mailing [place] [place], [place] [address] Physical Address-[address], [place], [place] [address] Toll Free Main # [phone] Fax # [phone] Cell # [phone]  Fax # [phone]  [email or web address]

## 2016-09-16 ENCOUNTER — Ambulatory Visit: Payer: Self-pay | Admitting: Diagnostic Neuroimaging

## 2016-09-17 ENCOUNTER — Encounter: Payer: Self-pay | Admitting: Diagnostic Neuroimaging

## 2016-09-24 ENCOUNTER — Other Ambulatory Visit: Payer: Self-pay | Admitting: *Deleted

## 2016-09-24 ENCOUNTER — Encounter: Payer: Self-pay | Admitting: *Deleted

## 2016-09-24 NOTE — Patient Outreach (Signed)
[facility]) Care Management  09/24/2016  Nathan Frank 19-Aug-1941 [id number]   Outreach letter sent on 12/5, no response.  Will send member and primary MD case closure letters.  Will notify care management assistant of case closure.  [name], [place], MSN [place] [phone]

## 2016-09-29 DIAGNOSIS — I63411 Cerebral infarction due to embolism of right middle cerebral artery: Secondary | ICD-10-CM | POA: Diagnosis not present

## 2016-10-08 ENCOUNTER — Other Ambulatory Visit: Payer: Self-pay

## 2016-10-08 NOTE — Patient Outreach (Signed)
First telephone outreach attempt to patient to obtain mRS. No answer, left message for return call. Will try again this week.   Jacqulynn Cadet  [facility] Care Management Assistant

## 2016-10-12 ENCOUNTER — Other Ambulatory Visit: Payer: Self-pay

## 2016-10-12 NOTE — Patient Outreach (Signed)
Second telephone outreach attempt to patient to obtain mRS. No answer, left message for return call. Will try again this week.   Jacqulynn Cadet  [facility] Care Management Assistant

## 2016-10-14 ENCOUNTER — Other Ambulatory Visit: Payer: Self-pay

## 2016-10-14 NOTE — Patient Outreach (Signed)
Third telephone outreach attempt to patient to obtain mRS. 3 telephone outreach attempts were completed to obtain mRS for patient. mRS could not be obtained because messages left for the patient requesting a return phone call but no return call received.  mRS = [place] Management Assistant

## 2016-10-16 DIAGNOSIS — I69392 Facial weakness following cerebral infarction: Secondary | ICD-10-CM | POA: Diagnosis not present

## 2016-10-16 DIAGNOSIS — I5033 Acute on chronic diastolic (congestive) heart failure: Secondary | ICD-10-CM | POA: Diagnosis not present

## 2016-10-16 DIAGNOSIS — I10 Essential (primary) hypertension: Secondary | ICD-10-CM | POA: Diagnosis not present

## 2016-10-19 NOTE — Progress Notes (Signed)
Late Entry Addendum to Initial Evaluation    08/21/16 1626  PT Time Calculation  PT Start Time (ACUTE ONLY) 0958  PT Stop Time (ACUTE ONLY) 1029  PT Time Calculation (min) (ACUTE ONLY) 31 min  PT G-Codes **NOT FOR INPATIENT CLASS**  Functional Assessment Tool Used clinical judgement  Functional Limitation Mobility: Walking and moving around  Mobility: Walking and Moving Around Current Status [id number]) CJ  Mobility: Walking and Moving Around Goal Status [id number]) CI  PT General Charges  $$ ACUTE PT VISIT 1 Procedure  PT Evaluation  $PT Eval Moderate Complexity 1 Procedure   10/19/2016  Donnella Sham, PT [phone]  (pager)

## 2016-10-28 NOTE — Progress Notes (Signed)
OT Note - Addendum G Code    Aug 24, 2016 1100  OT Visit Information  Last OT Received On 08-24-16  OT G-codes **NOT FOR INPATIENT CLASS**  Functional Assessment Tool Used clinical judgement  Functional Limitation Self care  Self Care Current Status [phone]) CI  Self Care Goal Status [id number]) CI  Self Care Discharge Status [phone]) CI  [facility], OT/L  For [place] OTR/L [id number] 10/28/2016

## 2016-10-30 DIAGNOSIS — I63411 Cerebral infarction due to embolism of right middle cerebral artery: Secondary | ICD-10-CM | POA: Diagnosis not present

## 2016-11-12 ENCOUNTER — Encounter (HOSPITAL_COMMUNITY): Payer: Self-pay | Admitting: Neurology

## 2016-11-12 ENCOUNTER — Emergency Department (HOSPITAL_COMMUNITY): Payer: Medicare Other

## 2016-11-12 ENCOUNTER — Inpatient Hospital Stay (HOSPITAL_COMMUNITY)
Admission: EM | Admit: 2016-11-12 | Discharge: 2016-11-15 | DRG: 309 | Disposition: A | Discharge status: Home Health | Payer: Medicare Other | Attending: Internal Medicine | Admitting: Internal Medicine

## 2016-11-12 DIAGNOSIS — I5022 Chronic systolic (congestive) heart failure: Secondary | ICD-10-CM | POA: Diagnosis not present

## 2016-11-12 DIAGNOSIS — E872 Acidosis: Secondary | ICD-10-CM | POA: Diagnosis not present

## 2016-11-12 DIAGNOSIS — Z79899 Other long term (current) drug therapy: Secondary | ICD-10-CM

## 2016-11-12 DIAGNOSIS — Z87891 Personal history of nicotine dependence: Secondary | ICD-10-CM

## 2016-11-12 DIAGNOSIS — Y900 Blood alcohol level of less than 20 mg/100 ml: Secondary | ICD-10-CM | POA: Diagnosis present

## 2016-11-12 DIAGNOSIS — Z9114 Patient's other noncompliance with medication regimen: Secondary | ICD-10-CM

## 2016-11-12 DIAGNOSIS — Z7901 Long term (current) use of anticoagulants: Secondary | ICD-10-CM

## 2016-11-12 DIAGNOSIS — E7220 Disorder of urea cycle metabolism, unspecified: Secondary | ICD-10-CM

## 2016-11-12 DIAGNOSIS — R404 Transient alteration of awareness: Secondary | ICD-10-CM | POA: Diagnosis not present

## 2016-11-12 DIAGNOSIS — Z885 Allergy status to narcotic agent status: Secondary | ICD-10-CM

## 2016-11-12 DIAGNOSIS — Z9119 Patient's noncompliance with other medical treatment and regimen: Secondary | ICD-10-CM | POA: Diagnosis not present

## 2016-11-12 DIAGNOSIS — I63411 Cerebral infarction due to embolism of right middle cerebral artery: Secondary | ICD-10-CM | POA: Diagnosis present

## 2016-11-12 DIAGNOSIS — D72819 Decreased white blood cell count, unspecified: Secondary | ICD-10-CM | POA: Diagnosis not present

## 2016-11-12 DIAGNOSIS — K7460 Unspecified cirrhosis of liver: Secondary | ICD-10-CM | POA: Diagnosis not present

## 2016-11-12 DIAGNOSIS — W19XXXA Unspecified fall, initial encounter: Secondary | ICD-10-CM

## 2016-11-12 DIAGNOSIS — R531 Weakness: Secondary | ICD-10-CM | POA: Diagnosis not present

## 2016-11-12 DIAGNOSIS — I2489 Other forms of acute ischemic heart disease: Secondary | ICD-10-CM

## 2016-11-12 DIAGNOSIS — Y92009 Unspecified place in unspecified non-institutional (private) residence as the place of occurrence of the external cause: Secondary | ICD-10-CM

## 2016-11-12 DIAGNOSIS — I110 Hypertensive heart disease with heart failure: Secondary | ICD-10-CM | POA: Diagnosis present

## 2016-11-12 DIAGNOSIS — I69354 Hemiplegia and hemiparesis following cerebral infarction affecting left non-dominant side: Secondary | ICD-10-CM | POA: Diagnosis not present

## 2016-11-12 DIAGNOSIS — N390 Urinary tract infection, site not specified: Secondary | ICD-10-CM | POA: Diagnosis present

## 2016-11-12 DIAGNOSIS — Z8505 Personal history of malignant neoplasm of liver: Secondary | ICD-10-CM

## 2016-11-12 DIAGNOSIS — R55 Syncope and collapse: Secondary | ICD-10-CM | POA: Diagnosis not present

## 2016-11-12 DIAGNOSIS — I428 Other cardiomyopathies: Secondary | ICD-10-CM

## 2016-11-12 DIAGNOSIS — N179 Acute kidney failure, unspecified: Secondary | ICD-10-CM | POA: Diagnosis not present

## 2016-11-12 DIAGNOSIS — F329 Major depressive disorder, single episode, unspecified: Secondary | ICD-10-CM | POA: Diagnosis present

## 2016-11-12 DIAGNOSIS — I429 Cardiomyopathy, unspecified: Secondary | ICD-10-CM | POA: Diagnosis present

## 2016-11-12 DIAGNOSIS — I4892 Unspecified atrial flutter: Secondary | ICD-10-CM | POA: Diagnosis not present

## 2016-11-12 DIAGNOSIS — F1910 Other psychoactive substance abuse, uncomplicated: Secondary | ICD-10-CM | POA: Diagnosis present

## 2016-11-12 DIAGNOSIS — I481 Persistent atrial fibrillation: Principal | ICD-10-CM | POA: Diagnosis present

## 2016-11-12 DIAGNOSIS — F1010 Alcohol abuse, uncomplicated: Secondary | ICD-10-CM | POA: Diagnosis not present

## 2016-11-12 DIAGNOSIS — R112 Nausea with vomiting, unspecified: Secondary | ICD-10-CM | POA: Diagnosis not present

## 2016-11-12 DIAGNOSIS — Z8042 Family history of malignant neoplasm of prostate: Secondary | ICD-10-CM

## 2016-11-12 DIAGNOSIS — K760 Fatty (change of) liver, not elsewhere classified: Secondary | ICD-10-CM | POA: Diagnosis present

## 2016-11-12 DIAGNOSIS — I248 Other forms of acute ischemic heart disease: Secondary | ICD-10-CM | POA: Diagnosis present

## 2016-11-12 DIAGNOSIS — I4891 Unspecified atrial fibrillation: Secondary | ICD-10-CM | POA: Diagnosis not present

## 2016-11-12 DIAGNOSIS — Z8249 Family history of ischemic heart disease and other diseases of the circulatory system: Secondary | ICD-10-CM

## 2016-11-12 DIAGNOSIS — S0990XA Unspecified injury of head, initial encounter: Secondary | ICD-10-CM | POA: Diagnosis not present

## 2016-11-12 DIAGNOSIS — C220 Liver cell carcinoma: Secondary | ICD-10-CM | POA: Diagnosis present

## 2016-11-12 DIAGNOSIS — R197 Diarrhea, unspecified: Secondary | ICD-10-CM | POA: Diagnosis not present

## 2016-11-12 DIAGNOSIS — K7030 Alcoholic cirrhosis of liver without ascites: Secondary | ICD-10-CM | POA: Diagnosis not present

## 2016-11-12 DIAGNOSIS — F1020 Alcohol dependence, uncomplicated: Secondary | ICD-10-CM | POA: Diagnosis present

## 2016-11-12 DIAGNOSIS — Z886 Allergy status to analgesic agent status: Secondary | ICD-10-CM

## 2016-11-12 DIAGNOSIS — Z86718 Personal history of other venous thrombosis and embolism: Secondary | ICD-10-CM

## 2016-11-12 DIAGNOSIS — I252 Old myocardial infarction: Secondary | ICD-10-CM

## 2016-11-12 DIAGNOSIS — I452 Bifascicular block: Secondary | ICD-10-CM | POA: Diagnosis not present

## 2016-11-12 DIAGNOSIS — S199XXA Unspecified injury of neck, initial encounter: Secondary | ICD-10-CM | POA: Diagnosis not present

## 2016-11-12 DIAGNOSIS — Z8711 Personal history of peptic ulcer disease: Secondary | ICD-10-CM

## 2016-11-12 DIAGNOSIS — S299XXA Unspecified injury of thorax, initial encounter: Secondary | ICD-10-CM | POA: Diagnosis not present

## 2016-11-12 DIAGNOSIS — Z823 Family history of stroke: Secondary | ICD-10-CM

## 2016-11-12 DIAGNOSIS — K219 Gastro-esophageal reflux disease without esophagitis: Secondary | ICD-10-CM | POA: Diagnosis not present

## 2016-11-12 DIAGNOSIS — I82409 Acute embolism and thrombosis of unspecified deep veins of unspecified lower extremity: Secondary | ICD-10-CM | POA: Diagnosis present

## 2016-11-12 DIAGNOSIS — W1809XA Striking against other object with subsequent fall, initial encounter: Secondary | ICD-10-CM | POA: Diagnosis present

## 2016-11-12 DIAGNOSIS — B1920 Unspecified viral hepatitis C without hepatic coma: Secondary | ICD-10-CM | POA: Diagnosis present

## 2016-11-12 DIAGNOSIS — I5042 Chronic combined systolic (congestive) and diastolic (congestive) heart failure: Secondary | ICD-10-CM | POA: Diagnosis present

## 2016-11-12 DIAGNOSIS — R946 Abnormal results of thyroid function studies: Secondary | ICD-10-CM | POA: Diagnosis not present

## 2016-11-12 DIAGNOSIS — R7989 Other specified abnormal findings of blood chemistry: Secondary | ICD-10-CM | POA: Diagnosis present

## 2016-11-12 DIAGNOSIS — D696 Thrombocytopenia, unspecified: Secondary | ICD-10-CM | POA: Diagnosis not present

## 2016-11-12 DIAGNOSIS — E039 Hypothyroidism, unspecified: Secondary | ICD-10-CM | POA: Diagnosis not present

## 2016-11-12 DIAGNOSIS — R319 Hematuria, unspecified: Secondary | ICD-10-CM

## 2016-11-12 LAB — HEPATIC FUNCTION PANEL
ALBUMIN: 2.5 g/dL — AB (ref 3.5–5.0)
ALT: 53 U/L (ref 17–63)
AST: 177 U/L — AB (ref 15–41)
Alkaline Phosphatase: 96 U/L (ref 38–126)
Bilirubin, Direct: 1.1 mg/dL — ABNORMAL HIGH (ref 0.1–0.5)
Indirect Bilirubin: 1.5 mg/dL — ABNORMAL HIGH (ref 0.3–0.9)
Total Bilirubin: 2.6 mg/dL — ABNORMAL HIGH (ref 0.3–1.2)
Total Protein: 7.2 g/dL (ref 6.5–8.1)

## 2016-11-12 LAB — I-STAT TROPONIN, ED
TROPONIN I, POC: 0.04 ng/mL (ref 0.00–0.08)
Troponin i, poc: 0.03 ng/mL (ref 0.00–0.08)

## 2016-11-12 LAB — BASIC METABOLIC PANEL
ANION GAP: 18 — AB (ref 5–15)
BUN: 9 mg/dL (ref 6–20)
CHLORIDE: 104 mmol/L (ref 101–111)
CO2: 18 mmol/L — AB (ref 22–32)
Calcium: 8.7 mg/dL — ABNORMAL LOW (ref 8.9–10.3)
Creatinine, Ser: 1.44 mg/dL — ABNORMAL HIGH (ref 0.61–1.24)
GFR calc non Af Amer: 46 mL/min — ABNORMAL LOW (ref >60)
GFR, EST AFRICAN AMERICAN: 53 mL/min — AB (ref >60)
GLUCOSE: 130 mg/dL — AB (ref 65–99)
POTASSIUM: 3.4 mmol/L — AB (ref 3.5–5.1)
Sodium: 140 mmol/L (ref 135–145)

## 2016-11-12 LAB — ETHANOL: Alcohol, Ethyl (B): <5 mg/dL (ref <5)

## 2016-11-12 LAB — TSH: TSH: 10.58 u[IU]/mL — AB (ref 0.350–4.500)

## 2016-11-12 LAB — MAGNESIUM: MAGNESIUM: 1.6 mg/dL — AB (ref 1.7–2.4)

## 2016-11-12 LAB — CBC WITH DIFFERENTIAL/PLATELET
BASOS ABS: 0 10*3/uL (ref 0.0–0.1)
BASOS PCT: 0 %
EOS PCT: 0 %
Eosinophils Absolute: 0 10*3/uL (ref 0.0–0.7)
HCT: 48.1 % (ref 39.0–52.0)
Hemoglobin: 17.5 g/dL — ABNORMAL HIGH (ref 13.0–17.0)
Lymphocytes Relative: 19 %
Lymphs Abs: 0.6 10*3/uL — ABNORMAL LOW (ref 0.7–4.0)
MCH: 33.9 pg (ref 26.0–34.0)
MCHC: 36.4 g/dL — ABNORMAL HIGH (ref 30.0–36.0)
MCV: 93.2 fL (ref 78.0–100.0)
MONO ABS: 0.3 10*3/uL (ref 0.1–1.0)
Monocytes Relative: 9 %
NEUTROS ABS: 2.2 10*3/uL (ref 1.7–7.7)
Neutrophils Relative %: 71 %
PLATELETS: 79 10*3/uL — AB (ref 150–400)
RBC: 5.16 MIL/uL (ref 4.22–5.81)
RDW: 18.4 % — AB (ref 11.5–15.5)
WBC: 3.1 10*3/uL — AB (ref 4.0–10.5)

## 2016-11-12 LAB — AMMONIA: AMMONIA: 144 umol/L — AB (ref 9–35)

## 2016-11-12 LAB — SALICYLATE LEVEL

## 2016-11-12 LAB — TROPONIN I: Troponin I: 0.05 ng/mL (ref <0.03)

## 2016-11-12 MED ORDER — ONDANSETRON HCL 4 MG PO TABS: 4.0000 mg | ORAL_TABLET | Freq: Four times a day (QID) | ORAL | Status: DC | PRN

## 2016-11-12 MED ORDER — ADULT MULTIVITAMIN W/MINERALS CH
1.0000 | ORAL_TABLET | Freq: Every day | ORAL | Status: DC
  Administered 2016-11-13 – 2016-11-15 (×3): 1 via ORAL
  Filled 2016-11-12 (×3): qty 1

## 2016-11-12 MED ORDER — LEVOTHYROXINE SODIUM 50 MCG PO TABS
50.0000 ug | ORAL_TABLET | Freq: Every day | ORAL | Status: DC
  Filled 2016-11-12: qty 1

## 2016-11-12 MED ORDER — METOPROLOL TARTRATE 25 MG PO TABS
25.0000 mg | ORAL_TABLET | Freq: Two times a day (BID) | ORAL | Status: DC
  Administered 2016-11-12: 25 mg via ORAL
  Filled 2016-11-12: qty 1

## 2016-11-12 MED ORDER — THIAMINE HCL 100 MG/ML IJ SOLN
100.0000 mg | Freq: Every day | INTRAMUSCULAR | Status: DC
  Filled 2016-11-12: qty 2

## 2016-11-12 MED ORDER — LORAZEPAM 2 MG/ML IJ SOLN: 1.0000 mg | Freq: Four times a day (QID) | INTRAMUSCULAR | Status: DC | PRN

## 2016-11-12 MED ORDER — DILTIAZEM HCL 100 MG IV SOLR
5.0000 mg/h | INTRAVENOUS | Status: DC
  Administered 2016-11-12: 5 mg/h via INTRAVENOUS
  Administered 2016-11-12: 15 mg/h via INTRAVENOUS
  Administered 2016-11-12: 5 mg/h via INTRAVENOUS
  Filled 2016-11-12 (×2): qty 100

## 2016-11-12 MED ORDER — TRAMADOL HCL 50 MG PO TABS: 50.0000 mg | ORAL_TABLET | Freq: Three times a day (TID) | ORAL | Status: DC | PRN

## 2016-11-12 MED ORDER — ONDANSETRON HCL 4 MG/2ML IJ SOLN: 4.0000 mg | Freq: Four times a day (QID) | INTRAMUSCULAR | Status: DC | PRN

## 2016-11-12 MED ORDER — LORAZEPAM 1 MG PO TABS: 1.0000 mg | ORAL_TABLET | Freq: Four times a day (QID) | ORAL | Status: DC | PRN

## 2016-11-12 MED ORDER — DILTIAZEM HCL 25 MG/5ML IV SOLN
10.0000 mg | Freq: Once | INTRAVENOUS | Status: AC
  Administered 2016-11-12: 10 mg via INTRAVENOUS

## 2016-11-12 MED ORDER — FOLIC ACID 1 MG PO TABS: 1.0000 mg | ORAL_TABLET | Freq: Every day | ORAL | Status: DC

## 2016-11-12 MED ORDER — FOLIC ACID 1 MG PO TABS
1.0000 mg | ORAL_TABLET | Freq: Every day | ORAL | Status: DC
  Administered 2016-11-13 – 2016-11-15 (×3): 1 mg via ORAL
  Filled 2016-11-12 (×3): qty 1

## 2016-11-12 MED ORDER — THIAMINE HCL 100 MG PO TABS: 100.0000 mg | ORAL_TABLET | Freq: Every day | ORAL | Status: DC

## 2016-11-12 MED ORDER — VITAMIN B-1 100 MG PO TABS
100.0000 mg | ORAL_TABLET | Freq: Every day | ORAL | Status: DC
  Administered 2016-11-13 – 2016-11-15 (×3): 100 mg via ORAL
  Filled 2016-11-12 (×3): qty 1

## 2016-11-12 NOTE — ED Provider Notes (Signed)
[facility] DEPT Provider Note CSN: [id number] Arrival date & time: 11/12/16  1439  History   Chief Complaint Chief Complaint  Patient presents with  . Atrial Fibrillation   HPI KOHLBY GILLIM is a 76 y.o. male.  The history is provided by the patient and medical records. No language interpreter was used.  Illness  This is a new problem. The current episode started more than 2 days ago. The problem occurs constantly. The problem has not changed since onset.Pertinent negatives include no chest pain, no abdominal pain, no headaches and no shortness of breath. The symptoms are aggravated by standing. The symptoms are relieved by position.   Past Medical History:  Diagnosis Date  . Abnormal TSH   . Acute gastric ulcer   . Acute gastritis with hemorrhage   . Alcohol abuse   . Alcohol dependence ([place]) 02/02/2014  . Anemia   . Bifascicular block   . Cirrhosis ([place]) 06/04/2012  . Depressive disorder 02/01/2014  . Dizziness and giddiness 12/13/2014  . DVT of lower limb, acute ([place]) 06/06/2012  . DVT, lower extremity, recurrent ([place])    a. noted 2013. b. also dx 06/2016.  Marland Kitchen Essential hypertension   . Fatty liver   . Gallstones   . Gastritis 06/07/2012  . GERD (gastroesophageal reflux disease)   . GI bleed due to NSAIDs 12/13/2014  . Granulomatous gastritis   . Hematuria 06/04/2012  . Hepatitis C   . Hepatocellular carcinoma ([place])   . Heroin abuse    "I haven't done that since I don't know when."  . Heroin overdose 02/20/2014  . Neuropathy ([place])   . NICM (nonischemic cardiomyopathy) ([place])    a. 04/2015: EF 45-50% by cath. b. EF 40-45% by echo 06/2016.  . NSTEMI (non-ST elevated myocardial infarction) ([place])    a. 04/2015 - patent coronaries. Etiology possibly due to coronary spasm versus embolus, stress cardiomyopathy (atypical), and aborted infarction related to plaque rupture with thrombosis and dissolution. Amlodipine started. Not on antiplatelets due to GIB/cirrhosis history.  . Oral  thrush 06/05/2012  . Polysubstance abuse    THC, alcohol, heroin  . Prolonged Q-T interval on ECG    a. 12/2014 - treated with magnesium.  . Right knee pain 12/13/2014  . S/P alcohol detoxification 02/02/2014  . Stroke ([place]) 06/2016  . SVT (supraventricular tachycardia) ([place])    a. 12/2014 in setting of GIB, ETOH, NSAIDS, gastritis.  . Symptomatic cholelithiasis 12/15/2013  . Thrombocytopenia ([place])   . Upper GI bleeding 12/13/2014  . Weight loss 06/04/2012   Patient Active Problem List   Diagnosis Date Noted  . Atrial fibrillation with RVR ([place]) 11/12/2016  . ARF (acute renal failure) ([place]) 11/12/2016  . Failure to thrive in adult   . Cellulitis of left knee 08/20/2016  . Back injury 08/20/2016  . Chronic systolic CHF (congestive heart failure) ([place]) 08/20/2016  . Atrial fibrillation with rapid ventricular response ([place]) 08/06/2016  . Acute blood loss anemia   . Lymphocytosis   . Atrial tachycardia ([place])   . Acute ischemic right MCA stroke ([place]) 07/04/2016  . Obesity 07/01/2016  . Family hx-stroke 07/01/2016  . Dysphagia, post-stroke   . Dysarthria, post-stroke   . Polysubstance abuse   . Chronic combined systolic and diastolic CHF (congestive heart failure) ([place])   . Mitral valve regurgitation   . Anemia of chronic disease   . 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 Acute on chronic  diastolic CHF (congestive heart failure) ([place]) 06/09/2016  . Alcoholic cirrhosis of liver with ascites ([place])   . Other specified hypothyroidism   . Hepatocellular carcinoma ([place])   . Generalized weakness 05/27/2016  . Hypotension 04/18/2016  . Chronic atrial fibrillation ([place]) 04/18/2016  . Prolonged Q-T interval on ECG   . Neuropathy ([place])   . Essential hypertension   . Thrombocytopenia ([place]) 04/16/2015  . Abnormal TSH 04/16/2015  . NSTEMI (non-ST elevated myocardial infarction) ([place])   . Acute gastritis with hemorrhage    . GI bleed due to NSAIDs 12/13/2014  . Upper GI bleeding 12/13/2014  . Alcohol dependence ([place]) 02/02/2014  . Depressive disorder 02/01/2014  . Symptomatic cholelithiasis 12/15/2013  . DVT of lower limb, acute ([place]) 06/06/2012  . Alcohol abuse 06/04/2012  . Liver cirrhosis ([place]) 06/04/2012   Past Surgical History:  Procedure Laterality Date  . CARDIAC CATHETERIZATION N/A 04/15/2015   Procedure: Left Heart Cath and Coronary Angiography;  Surgeon: Belva Crome, MD;  Location: [facility] CV LAB;  Service: Cardiovascular;  Laterality: N/A;  . CIRCUMCISION    . ESOPHAGOGASTRODUODENOSCOPY  06/07/2012   Procedure: ESOPHAGOGASTRODUODENOSCOPY (EGD);  Surgeon: Milus Banister, MD;  Location: [facility];  Service: Endoscopy;  Laterality: N/A;  may need treatment of varices  . ESOPHAGOGASTRODUODENOSCOPY N/A 12/14/2014   Procedure: ESOPHAGOGASTRODUODENOSCOPY (EGD);  Surgeon: Jerene Bears, MD;  Location[facility] ENDOSCOPY;  Service: Endoscopy;  Laterality: N/A;  . IR GENERIC HISTORICAL  06/25/2016   IR US GUIDE VASC ACCESS LEFT 06/25/2016 Corrie Mckusick, DO [facility]INTER[facility]  . IR GENERIC HISTORICAL  06/25/2016   IR RADIOLOGY PERIPHERAL GUIDED IV START 06/25/2016 Corrie Mckusick, DO [facility]  . IR GENERIC HISTORICAL  06/25/2016   IR PERCUTANEOUS ART THROMBECTOMY/INFUSION INTRACRANIAL INC DIAG ANGIO 06/25/2016 Luanne Bras, MD [facility]  . RADIOLOGY WITH ANESTHESIA N/A 06/25/2016   Procedure: RADIOLOGY WITH ANESTHESIA;  Surgeon: Luanne Bras, MD;  Location: [facility];  Service: Radiology;  Laterality: N/A;  . TONSILLECTOMY     Home Medications    Prior to Admission medications   Medication Sig Start Date End Date Taking? Authorizing Provider  Multiple Vitamin (MULTIVITAMIN WITH MINERALS) TABS tablet Take 1 tablet by mouth daily. 04/21/16  Yes Maryann Mikhail, DO  traMADol (ULTRAM) 50 MG tablet Take 50 mg by mouth 3 (three) times daily as needed for pain. 11/02/16  Yes Historical Provider, MD    amitriptyline (ELAVIL) 10 MG tablet Take 1 tablet (10 mg total) by mouth at bedtime. Patient not taking: Reported on 11/12/2016 07/30/16   Ivan Anchors Love, PA-C  apixaban (ELIQUIS) 5 MG TABS tablet Take 1 tablet (5 mg total) by mouth 2 (two) times daily. Patient not taking: Reported on 11/12/2016 08/09/16   Patrecia Pour, MD  folic acid (FOLVITE) 1 MG tablet Take 1 tablet (1 mg total) by mouth daily. Patient not taking: Reported on 11/12/2016 04/21/16   Cristal Ford, DO  levothyroxine (SYNTHROID, LEVOTHROID) 50 MCG tablet Take 1 tablet (50 mcg total) by mouth daily before breakfast. Patient not taking: Reported on 11/12/2016 06/16/16   Florencia Reasons, MD  magnesium oxide (MAG-OX) 400 (241.3 Mg) MG tablet Take 1 tablet (400 mg total) by mouth daily. Patient not taking: Reported on 11/12/2016 06/03/16   Verlee Monte, MD  metoprolol (LOPRESSOR) 50 MG tablet Take 1 tablet (50 mg total) by mouth 3 (three) times daily. Patient not taking: Reported on 11/12/2016 07/30/16   Ivan Anchors Love, PA-C  midodrine (PROAMATINE) 10 MG tablet Take 10  mg by mouth 3 (three) times daily with meals. 08/24/16   Historical Provider, MD  thiamine 100 MG tablet Take 1 tablet (100 mg total) by mouth daily. Patient not taking: Reported on 11/12/2016 08/24/16   Shon Millet, DO   Family History Family History  Problem Relation Age of Onset  . Stroke Father   . Prostate cancer Brother   . Heart disease Mother     Pacemaker  . Diabetes Neg Hx    Social History Social History  Substance Use Topics  . Smoking status: Former Smoker    Packs/day: 0.50    Years: 5.00    Types: Cigarettes  . Smokeless tobacco: Never Used     Comment: "quit smoking cigarettes in the 1970's"  . Alcohol use No     Comment: 06/2016 " I quit drinking a couple of weeks ago "   Allergies   Nsaids and Oxycodone  Review of Systems Review of Systems  Constitutional: Positive for appetite change (decreased) and fatigue. Negative for chills and fever.   Respiratory: Negative for shortness of breath.   Cardiovascular: Negative for chest pain.  Gastrointestinal: Positive for diarrhea (loose, non-bloody) and nausea. Negative for abdominal pain and vomiting.  Neurological: Positive for syncope. Negative for seizures, facial asymmetry, speech difficulty, weakness, numbness and headaches.  All other systems reviewed and are negative.  Physical Exam Updated Vital Signs BP 103/78   Pulse 71   Temp 97.8 F (36.6 C) (Axillary)   Resp 21   SpO2 96%   Physical Exam  Constitutional: He is oriented to person, place, and time. No distress.  Elderly thin AA male  HENT:  Head: Normocephalic and atraumatic.  Eyes: EOM are normal. Pupils are equal, round, and reactive to light.  Neck: Normal range of motion. Neck supple.  Cardiovascular: Normal heart sounds.  An irregularly irregular rhythm present. Tachycardia present.   Pulmonary/Chest: Effort normal and breath sounds normal.  Abdominal: Soft. Bowel sounds are normal. He exhibits no distension. There is no tenderness.  Musculoskeletal: Normal range of motion.  Neurological: He is alert and oriented to person, place, and time.  Cranial nerves II through XII intact, strength 5/5 throughout, sensation grossly normal, speech fluent, coordination normal, DTRs 2+  Skin: Skin is warm and dry. Capillary refill takes less than 2 seconds. He is not diaphoretic.  Nursing note and vitals reviewed.  ED Treatments / Results  Labs (all labs ordered are listed, but only abnormal results are displayed) Labs Reviewed  TSH - Abnormal; Notable for the following:       Result Value   TSH 10.580 (*)    All other components within normal limits  CBC WITH DIFFERENTIAL/PLATELET - Abnormal; Notable for the following:    WBC 3.1 (*)    Hemoglobin 17.5 (*)    MCHC 36.4 (*)    RDW 18.4 (*)    Platelets 79 (*)    Lymphs Abs 0.6 (*)    All other components within normal limits  HEPATIC FUNCTION PANEL - Abnormal;  Notable for the following:    Albumin 2.5 (*)    AST 177 (*)    Total Bilirubin 2.6 (*)    Bilirubin, Direct 1.1 (*)    Indirect Bilirubin 1.5 (*)    All other components within normal limits  BASIC METABOLIC PANEL - Abnormal; Notable for the following:    Potassium 3.4 (*)    CO2 18 (*)    Glucose, Bld 130 (*)    Creatinine,  Ser 1.44 (*)    Calcium 8.7 (*)    GFR calc non Af Amer 46 (*)    GFR calc Af Amer 53 (*)    Anion gap 18 (*)    All other components within normal limits  AMMONIA - Abnormal; Notable for the following:    Ammonia 144 (*)    All other components within normal limits  TROPONIN I - Abnormal; Notable for the following:    Troponin I 0.05 (*)    All other components within normal limits  MAGNESIUM - Abnormal; Notable for the following:    Magnesium 1.6 (*)    All other components within normal limits  ETHANOL  SALICYLATE LEVEL  CBC WITH DIFFERENTIAL/PLATELET  URINALYSIS, ROUTINE W REFLEX MICROSCOPIC  RAPID URINE DRUG SCREEN, HOSP PERFORMED  TROPONIN I  TROPONIN I  URINALYSIS, ROUTINE W REFLEX MICROSCOPIC  SODIUM, URINE, RANDOM  CREATININE, URINE, RANDOM  BASIC METABOLIC PANEL  CBC  LACTIC ACID, PLASMA  BASIC METABOLIC PANEL  I-STAT TROPOININ, ED  I-STAT TROPOININ, ED  I-STAT TROPOININ, ED   EKG  EKG Interpretation  Date/Time:  Thursday November 12 2016 14:45:34 EST Ventricular Rate:  152 PR Interval:    QRS Duration: 143 QT Interval:  303 QTC Calculation: 482 R Axis:   -148 Text Interpretation:  Extreme tachycardia with wide complex, no further rhythm analysis attempted similar to oct 2017 Otherwise no significant change Confirmed by Tyrone Nine MD, Quillian Quince [id number]) on 11/12/2016 3:37:20 PM Also confirmed by Tyrone Nine MD, DANIEL [phone]), editor [place] CT, Leda Gauze [phone])  on 11/12/2016 4:52:40 PM      Radiology Ct Head Wo Contrast  Result Date: 11/12/2016 CLINICAL DATA:  76 year old male status post fall on blood thinners. Initial encounter. EXAM: CT HEAD  WITHOUT CONTRAST CT CERVICAL SPINE WITHOUT CONTRAST TECHNIQUE: Multidetector CT imaging of the head and cervical spine was performed following the standard protocol without intravenous contrast. Multiplanar CT image reconstructions of the cervical spine were also generated. COMPARISON:  Head CT without contrast 08/20/2016. Head and cervical spine CT 08/12/2016. FINDINGS: CT HEAD FINDINGS Brain: Extensive right MCA territory encephalomalacia re - demonstrated and largely related to the September 2017 infarct. Superimposed chronic posterior left MCA territory encephalomalacia. Stable cerebral volume. Stable gray-white matter differentiation throughout the brain. No acute intracranial hemorrhage identified. No midline shift, mass effect, or evidence of intracranial mass lesion. No ventriculomegaly. No new cortically based infarct identified. Vascular: Calcified atherosclerosis at the skull base. Skull: Intact.  No acute osseous abnormality identified. Sinuses/Orbits: Visualized paranasal sinuses and mastoids are stable and well pneumatized. Other: No acute orbit or scalp soft tissue finding. CT CERVICAL SPINE FINDINGS Alignment: Improved cervical lordosis compared to November. Cervicothoracic junction alignment is within normal limits. Bilateral posterior element alignment is within normal limits. Skull base and vertebrae: Visualized skull base is intact. No atlanto-occipital dissociation. No acute cervical spine fracture identified. Soft tissues and spinal canal: No prevertebral fluid or swelling. No visible canal hematoma. Negative noncontrast neck soft tissues. Disc levels: Mild degenerative cervical spinal stenosis suspected at C3-C4 in part related to broad-based disc (series 302, image 62). Upper chest: Visible upper thoracic levels appear intact. Negative lung apices. Negative visualized superior noncontrast mediastinum. IMPRESSION: 1. No acute intracranial abnormality. Stable noncontrast CT appearance of the  brain with extensive right worse than left chronic MCA territory ischemia. 2.  No acute fracture or listhesis identified in the cervical spine. 3. Mild degenerative cervical spinal stenosis suspected at C3-C4. Electronically Signed   By: Genevie Ann  M.D.   On: 11/12/2016 17:11   Ct Cervical Spine Wo Contrast  Result Date: 11/12/2016 CLINICAL DATA:  76 year old male status post fall on blood thinners. Initial encounter. EXAM: CT HEAD WITHOUT CONTRAST CT CERVICAL SPINE WITHOUT CONTRAST TECHNIQUE: Multidetector CT imaging of the head and cervical spine was performed following the standard protocol without intravenous contrast. Multiplanar CT image reconstructions of the cervical spine were also generated. COMPARISON:  Head CT without contrast 08/20/2016. Head and cervical spine CT 08/12/2016. FINDINGS: CT HEAD FINDINGS Brain: Extensive right MCA territory encephalomalacia re - demonstrated and largely related to the September 2017 infarct. Superimposed chronic posterior left MCA territory encephalomalacia. Stable cerebral volume. Stable gray-white matter differentiation throughout the brain. No acute intracranial hemorrhage identified. No midline shift, mass effect, or evidence of intracranial mass lesion. No ventriculomegaly. No new cortically based infarct identified. Vascular: Calcified atherosclerosis at the skull base. Skull: Intact.  No acute osseous abnormality identified. Sinuses/Orbits: Visualized paranasal sinuses and mastoids are stable and well pneumatized. Other: No acute orbit or scalp soft tissue finding. CT CERVICAL SPINE FINDINGS Alignment: Improved cervical lordosis compared to November. Cervicothoracic junction alignment is within normal limits. Bilateral posterior element alignment is within normal limits. Skull base and vertebrae: Visualized skull base is intact. No atlanto-occipital dissociation. No acute cervical spine fracture identified. Soft tissues and spinal canal: No prevertebral fluid or  swelling. No visible canal hematoma. Negative noncontrast neck soft tissues. Disc levels: Mild degenerative cervical spinal stenosis suspected at C3-C4 in part related to broad-based disc (series 302, image 62). Upper chest: Visible upper thoracic levels appear intact. Negative lung apices. Negative visualized superior noncontrast mediastinum. IMPRESSION: 1. No acute intracranial abnormality. Stable noncontrast CT appearance of the brain with extensive right worse than left chronic MCA territory ischemia. 2.  No acute fracture or listhesis identified in the cervical spine. 3. Mild degenerative cervical spinal stenosis suspected at C3-C4. Electronically Signed   By: Genevie Ann M.D.   On: 11/12/2016 17:11   Dg Chest Portable 1 View  Result Date: 11/12/2016 CLINICAL DATA:  76 year old male status post fall. Dizziness, atrial fibrillation with rapid ventricular response. Lethargy. Initial encounter. EXAM: PORTABLE CHEST 1 VIEW COMPARISON:  08/12/2016 and earlier. FINDINGS: Portable AP semi upright view at 1547 hours. Stable mild-to-moderate cardiomegaly. Stable mild tortuosity of the thoracic aorta. Calcified aortic atherosclerosis. Other mediastinal contours are within normal limits. Visualized tracheal air column is within normal limits. Allowing for portable technique the lungs are clear. No pneumothorax. Negative visible bowel gas pattern. Chronic left lateral fourth rib fracture. IMPRESSION: No acute cardiopulmonary abnormality. Stable cardiomegaly.  Calcified aortic atherosclerosis. Electronically Signed   By: Genevie Ann M.D.   On: 11/12/2016 15:56   Procedures Procedures (including critical care time)  Medications Ordered in ED Medications  diltiazem (CARDIZEM) 100 mg in dextrose 5 % 100 mL (1 mg/mL) infusion (15 mg/hr Intravenous New Bag/Given 11/12/16 2200)  traMADol (ULTRAM) tablet 50 mg (not administered)  metoprolol tartrate (LOPRESSOR) tablet 25 mg (25 mg Oral Given 11/12/16 2308)  levothyroxine  (SYNTHROID, LEVOTHROID) tablet 50 mcg (not administered)  LORazepam (ATIVAN) tablet 1 mg (not administered)    Or  LORazepam (ATIVAN) injection 1 mg (not administered)  thiamine (VITAMIN B-1) tablet 100 mg (not administered)    Or  thiamine (B-1) injection 100 mg (not administered)  folic acid (FOLVITE) tablet 1 mg (not administered)  multivitamin with minerals tablet 1 tablet (not administered)  ondansetron (ZOFRAN) tablet 4 mg (not administered)    Or  ondansetron (ZOFRAN)  injection 4 mg (not administered)  diltiazem (CARDIZEM) injection 10 mg (10 mg Intravenous Given 11/12/16 1512)   Initial Impression / Assessment and Plan / ED Course  I have reviewed the triage vital signs and the nursing notes.  76 y.o. male with above stated PMHx, HPI, and physical. PMHx of polysubstance abuse (EtOH, heroin, THC), hypothyroidism, HTN, DVT, A-fib (metoprolol & Eliquis), CAD (NSTEMI, patent coronary arteries on cath), NICM, cirrhosis (hepatitis C, hepatocellular carcinoma, EtOH abuse), & CVA (R MCA). Recent nausea, vomiting, diarrhea. Patient with episode of syncope shortly after standing from using the restroom. Patient fell. Patient denies any preceding or following symptoms including no headache, vision changes, chest pain, shortness of breath, focal neurologic symptoms.  EKG showing A. fib with RVR to 150s as well as ST depression and ST elevations anteriorly. Patient without chest pain. Troponin not elevated. Chest x-ray showing no acute cardiopulmonary disease. CT neck showing no acute fracture or malalignment. CT head showing no acute intracranial abnormality.  Laboratory and imaging results were personally reviewed by myself and used in the medical decision making of this patient's treatment and disposition.  Pt admitted to medicine for further evaluation and management of above. Pt understands and agrees with the plan and has no further questions or concerns.   Pt care discussed with and followed  by my attending, Dr. Deno Etienne  Mayer Camel, MD Pager [phone]  Final Clinical Impressions(s) / ED Diagnoses   Final diagnoses:  Atrial fibrillation with RVR ([place])  Syncope  Fall, initial encounter  Nausea vomiting and diarrhea  Hyperammonemia (HCC)  Elevated TSH  AKI (acute kidney injury) [facility])   New Prescriptions New Prescriptions   No medications on file     Mayer Camel, MD 11/12/16 [place], DO 11/12/16 2346

## 2016-11-12 NOTE — ED Triage Notes (Addendum)
Per ems- pt comes from home, was walking and felt dizzy. He fell and laid on the bed. EMS arrived AFIB HR 180, has hx of AFIB. Pt is alert, but lethargic. Unable to get IV access. BP 88 systolic, recent [id number]. CBG 128.

## 2016-11-12 NOTE — ED Notes (Signed)
C-collar removed per Dr. Tyrone Nine and given some water. Pt tried to provide urine sample and unable at this time. Repeat EKG done HR 90-115 AFIB.

## 2016-11-12 NOTE — H&P (Addendum)
History and Physical    Nathan Frank [id number] DOB: Jan 19, 1941 DOA: 11/12/2016  PCP: Thressa Sheller, MD  Patient coming from: Home.  Chief Complaint: Fall.  HPI: Nathan Frank is a 76 y.o. male with history of cirrhosis of the liver, hepatitis C, alcohol abuse, nonischemic cardiomyopathy, stroke with left-sided hemiparesis, hypothyroidism, medical noncompliance was brought to the ER after patient had a fall at his home. Patient states he was trying to walk to the bathroom when he tripped on a rug and fell on the bed. Denies losing consciousness. Patient was brought to the ER and is found to be in A. fib with RVR. Patient is a poor historian. Denies any chest pain shortness of breath or any new weakness. CT of the head and C-spine was unremarkable. Patient was started on Cardizem infusion after bolus for A. fib with RVR. Patient states he only takes lactulose and tramadol for his known history of cirrhosis but has not been taking any of his other medications for A. fib or hypothyroidism. Patient states he was not prescribed. Patient states his last alcoholic drink was 10 days ago.   ED Course: Patient was started on Cardizem infusion after bolus for A. fib with RVR. EKG shows A. fib with RVR. Chest x-ray unremarkable. CT head and C-spine was unremarkable. Labs also revealed metabolic acidosis with acute renal failure.  Review of Systems: As per HPI, rest all negative.   Past Medical History:  Diagnosis Date  . Abnormal TSH   . Acute gastric ulcer   . Acute gastritis with hemorrhage   . Alcohol abuse   . Alcohol dependence ([place]) 02/02/2014  . Anemia   . Bifascicular block   . Cirrhosis ([place]) 06/04/2012  . Depressive disorder 02/01/2014  . Dizziness and giddiness 12/13/2014  . DVT of lower limb, acute ([place]) 06/06/2012  . DVT, lower extremity, recurrent ([place])    a. noted 2013. b. also dx 06/2016.  Marland Kitchen Essential hypertension   . Fatty liver   . Gallstones   . Gastritis 06/07/2012  .  GERD (gastroesophageal reflux disease)   . GI bleed due to NSAIDs 12/13/2014  . Granulomatous gastritis   . Hematuria 06/04/2012  . Hepatitis C   . Hepatocellular carcinoma ([place])   . Heroin abuse    "I haven't done that since I don't know when."  . Heroin overdose 02/20/2014  . Neuropathy ([place])   . NICM (nonischemic cardiomyopathy) ([place])    a. 04/2015: EF 45-50% by cath. b. EF 40-45% by echo 06/2016.  . NSTEMI (non-ST elevated myocardial infarction) ([place])    a. 04/2015 - patent coronaries. Etiology possibly due to coronary spasm versus embolus, stress cardiomyopathy (atypical), and aborted infarction related to plaque rupture with thrombosis and dissolution. Amlodipine started. Not on antiplatelets due to GIB/cirrhosis history.  . Oral thrush 06/05/2012  . Polysubstance abuse    THC, alcohol, heroin  . Prolonged Q-T interval on ECG    a. 12/2014 - treated with magnesium.  . Right knee pain 12/13/2014  . S/P alcohol detoxification 02/02/2014  . Stroke ([place]) 06/2016  . SVT (supraventricular tachycardia) ([place])    a. 12/2014 in setting of GIB, ETOH, NSAIDS, gastritis.  . Symptomatic cholelithiasis 12/15/2013  . Thrombocytopenia ([place])   . Upper GI bleeding 12/13/2014  . Weight loss 06/04/2012    Past Surgical History:  Procedure Laterality Date  . CARDIAC CATHETERIZATION N/A 04/15/2015   Procedure: Left Heart Cath and Coronary Angiography;  Surgeon: Belva Crome, MD;  Location:  [facility];  Service: Cardiovascular;  Laterality: N/A;  . CIRCUMCISION    . ESOPHAGOGASTRODUODENOSCOPY  06/07/2012   Procedure: ESOPHAGOGASTRODUODENOSCOPY (EGD);  Surgeon: Milus Banister, MD;  Location: [facility];  Service: Endoscopy;  Laterality: N/A;  may need treatment of varices  . ESOPHAGOGASTRODUODENOSCOPY N/A 12/14/2014   Procedure: ESOPHAGOGASTRODUODENOSCOPY (EGD);  Surgeon: Jerene Bears, MD;  Location[facility];  Service: Endoscopy;  Laterality: N/A;  . IR GENERIC HISTORICAL  06/25/2016   IR US GUIDE  VASC ACCESS LEFT 06/25/2016 Corrie Mckusick, DO [facility]  . IR GENERIC HISTORICAL  06/25/2016   IR RADIOLOGY PERIPHERAL GUIDED IV START 06/25/2016 Corrie Mckusick, DO [facility]  . IR GENERIC HISTORICAL  06/25/2016   IR PERCUTANEOUS ART THROMBECTOMY/INFUSION INTRACRANIAL INC DIAG ANGIO 06/25/2016 Luanne Bras, MD [facility]  . RADIOLOGY WITH ANESTHESIA N/A 06/25/2016   Procedure: RADIOLOGY WITH ANESTHESIA;  Surgeon: Luanne Bras, MD;  Location: [facility];  Service: Radiology;  Laterality: N/A;  . TONSILLECTOMY       reports that he has quit smoking. His smoking use included Cigarettes. He has a 2.50 pack-year smoking history. He has never used smokeless tobacco. He reports that he does not drink alcohol or use drugs.  Allergies  Allergen Reactions  . Nsaids Other (See Comments)    Acute gastric ulcer  . Oxycodone Other (See Comments)    as per the risk calculating tool RIOSORD (Risk Index for Overdose or Serious Opioid -induced Respiratory Depression Risk ) calculated risk in ensuing 6 months  = 83% ( see Problem List for discussion)    Family History  Problem Relation Age of Onset  . Stroke Father   . Prostate cancer Brother   . Heart disease Mother     Pacemaker  . Diabetes Neg Hx     Prior to Admission medications   Medication Sig Start Date End Date Taking? Authorizing Provider  Multiple Vitamin (MULTIVITAMIN WITH MINERALS) TABS tablet Take 1 tablet by mouth daily. 04/21/16  Yes Maryann Mikhail, DO  traMADol (ULTRAM) 50 MG tablet Take 50 mg by mouth 3 (three) times daily as needed for pain. 11/02/16  Yes Historical Provider, MD  amitriptyline (ELAVIL) 10 MG tablet Take 1 tablet (10 mg total) by mouth at bedtime. Patient not taking: Reported on 11/12/2016 07/30/16   Ivan Anchors Love, PA-C  apixaban (ELIQUIS) 5 MG TABS tablet Take 1 tablet (5 mg total) by mouth 2 (two) times daily. Patient not taking: Reported on 11/12/2016 08/09/16   Patrecia Pour, MD  folic acid (FOLVITE) 1 MG  tablet Take 1 tablet (1 mg total) by mouth daily. Patient not taking: Reported on 11/12/2016 04/21/16   Cristal Ford, DO  levothyroxine (SYNTHROID, LEVOTHROID) 50 MCG tablet Take 1 tablet (50 mcg total) by mouth daily before breakfast. Patient not taking: Reported on 11/12/2016 06/16/16   Florencia Reasons, MD  magnesium oxide (MAG-OX) 400 (241.3 Mg) MG tablet Take 1 tablet (400 mg total) by mouth daily. Patient not taking: Reported on 11/12/2016 06/03/16   Verlee Monte, MD  metoprolol (LOPRESSOR) 50 MG tablet Take 1 tablet (50 mg total) by mouth 3 (three) times daily. Patient not taking: Reported on 11/12/2016 07/30/16   Ivan Anchors Love, PA-C  midodrine (PROAMATINE) 10 MG tablet Take 10 mg by mouth 3 (three) times daily with meals. 08/24/16   Historical Provider, MD  thiamine 100 MG tablet Take 1 tablet (100 mg total) by mouth daily. Patient not taking: Reported on 11/12/2016 08/24/16   Anderson Malta  Clifton James, DO    Physical Exam: Vitals:   11/12/16 1920 11/12/16 2000 11/12/16 2030 11/12/16 2115  BP: 106/91 113/86 105/93 102/75  Pulse: 69 72 94 96  Resp: 12 19 18 13   Temp:      TempSrc:      SpO2: 95% 95% 95% 97%      Constitutional: Moderately built and nourished. Vitals:   11/12/16 1920 11/12/16 2000 11/12/16 2030 11/12/16 2115  BP: 106/91 113/86 105/93 102/75  Pulse: 69 72 94 96  Resp: 12 19 18 13   Temp:      TempSrc:      SpO2: 95% 95% 95% 97%   Eyes: Anicteric no pallor. ENMT: No discharge from the ears eyes nose and mouth. Neck: No mass felt. No JVD appreciated. Respiratory: No rhonchi or crepitations. Cardiovascular: S1 and S2 heard no murmurs appreciated. Abdomen: Soft nontender bowel sounds present. Musculoskeletal: No edema no joint effusion. Skin: No rash. Skin appears warm. Neurologic: Alert awake oriented to time place and person. Weakness on the left side from previous stroke. Right facial droop. Psychiatric: Appears normal. Denies any depression.   Labs on Admission: I  have personally reviewed following labs and imaging studies  CBC:  Recent Labs Lab 11/12/16 1619  WBC 3.1*  NEUTROABS 2.2  HGB 17.5*  HCT 48.1  MCV 93.2  PLT 79*   Basic Metabolic Panel:  Recent Labs Lab 11/12/16 1619  NA 140  K 3.4*  CL 104  CO2 18*  GLUCOSE 130*  BUN 9  CREATININE 1.44*  CALCIUM 8.7*   GFR: CrCl cannot be calculated (Unknown ideal weight.). Liver Function Tests:  Recent Labs Lab 11/12/16 1619  AST 177*  ALT 53  ALKPHOS 96  BILITOT 2.6*  PROT 7.2  ALBUMIN 2.5*   No results for input(s): LIPASE, AMYLASE in the last 168 hours. No results for input(s): AMMONIA in the last 168 hours. Coagulation Profile: No results for input(s): INR, PROTIME in the last 168 hours. Cardiac Enzymes: No results for input(s): CKTOTAL, CKMB, CKMBINDEX, TROPONINI in the last 168 hours. BNP (last 3 results) No results for input(s): PROBNP in the last 8760 hours. HbA1C: No results for input(s): HGBA1C in the last 72 hours. CBG: No results for input(s): GLUCAP in the last 168 hours. Lipid Profile: No results for input(s): CHOL, HDL, LDLCALC, TRIG, CHOLHDL, LDLDIRECT in the last 72 hours. Thyroid Function Tests:  Recent Labs  11/12/16 1619  TSH 10.580*   Anemia Panel: No results for input(s): VITAMINB12, FOLATE, FERRITIN, TIBC, IRON, RETICCTPCT in the last 72 hours. Urine analysis:    Component Value Date/Time   COLORURINE ORANGE (A) 08/20/2016 1618   APPEARANCEUR CLEAR 08/20/2016 1618   LABSPEC 1.022 08/20/2016 1618   PHURINE 6.0 08/20/2016 1618   GLUCOSEU NEGATIVE 08/20/2016 1618   HGBUR NEGATIVE 08/20/2016 1618   BILIRUBINUR SMALL (A) 08/20/2016 1618   KETONESUR 15 (A) 08/20/2016 1618   PROTEINUR 30 (A) 08/20/2016 1618   UROBILINOGEN 2.0 (H) 12/13/2014 1256   NITRITE NEGATIVE 08/20/2016 1618   LEUKOCYTESUR SMALL (A) 08/20/2016 1618   Sepsis Labs: @LABRCNTIP (procalcitonin:4,lacticidven:4) )No results found for this or any previous visit (from  the past 240 hour(s)).   Radiological Exams on Admission: Ct Head Wo Contrast  Result Date: 11/12/2016 CLINICAL DATA:  76 year old male status post fall on blood thinners. Initial encounter. EXAM: CT HEAD WITHOUT CONTRAST CT CERVICAL SPINE WITHOUT CONTRAST TECHNIQUE: Multidetector CT imaging of the head and cervical spine was performed following the standard protocol without  intravenous contrast. Multiplanar CT image reconstructions of the cervical spine were also generated. COMPARISON:  Head CT without contrast 08/20/2016. Head and cervical spine CT 08/12/2016. FINDINGS: CT HEAD FINDINGS Brain: Extensive right MCA territory encephalomalacia re - demonstrated and largely related to the September 2017 infarct. Superimposed chronic posterior left MCA territory encephalomalacia. Stable cerebral volume. Stable gray-white matter differentiation throughout the brain. No acute intracranial hemorrhage identified. No midline shift, mass effect, or evidence of intracranial mass lesion. No ventriculomegaly. No new cortically based infarct identified. Vascular: Calcified atherosclerosis at the skull base. Skull: Intact.  No acute osseous abnormality identified. Sinuses/Orbits: Visualized paranasal sinuses and mastoids are stable and well pneumatized. Other: No acute orbit or scalp soft tissue finding. CT CERVICAL SPINE FINDINGS Alignment: Improved cervical lordosis compared to November. Cervicothoracic junction alignment is within normal limits. Bilateral posterior element alignment is within normal limits. Skull base and vertebrae: Visualized skull base is intact. No atlanto-occipital dissociation. No acute cervical spine fracture identified. Soft tissues and spinal canal: No prevertebral fluid or swelling. No visible canal hematoma. Negative noncontrast neck soft tissues. Disc levels: Mild degenerative cervical spinal stenosis suspected at C3-C4 in part related to broad-based disc (series 302, image 62). Upper chest:  Visible upper thoracic levels appear intact. Negative lung apices. Negative visualized superior noncontrast mediastinum. IMPRESSION: 1. No acute intracranial abnormality. Stable noncontrast CT appearance of the brain with extensive right worse than left chronic MCA territory ischemia. 2.  No acute fracture or listhesis identified in the cervical spine. 3. Mild degenerative cervical spinal stenosis suspected at C3-C4. Electronically Signed   By: Genevie Ann M.D.   On: 11/12/2016 17:11   Ct Cervical Spine Wo Contrast  Result Date: 11/12/2016 CLINICAL DATA:  76 year old male status post fall on blood thinners. Initial encounter. EXAM: CT HEAD WITHOUT CONTRAST CT CERVICAL SPINE WITHOUT CONTRAST TECHNIQUE: Multidetector CT imaging of the head and cervical spine was performed following the standard protocol without intravenous contrast. Multiplanar CT image reconstructions of the cervical spine were also generated. COMPARISON:  Head CT without contrast 08/20/2016. Head and cervical spine CT 08/12/2016. FINDINGS: CT HEAD FINDINGS Brain: Extensive right MCA territory encephalomalacia re - demonstrated and largely related to the September 2017 infarct. Superimposed chronic posterior left MCA territory encephalomalacia. Stable cerebral volume. Stable gray-white matter differentiation throughout the brain. No acute intracranial hemorrhage identified. No midline shift, mass effect, or evidence of intracranial mass lesion. No ventriculomegaly. No new cortically based infarct identified. Vascular: Calcified atherosclerosis at the skull base. Skull: Intact.  No acute osseous abnormality identified. Sinuses/Orbits: Visualized paranasal sinuses and mastoids are stable and well pneumatized. Other: No acute orbit or scalp soft tissue finding. CT CERVICAL SPINE FINDINGS Alignment: Improved cervical lordosis compared to November. Cervicothoracic junction alignment is within normal limits. Bilateral posterior element alignment is within  normal limits. Skull base and vertebrae: Visualized skull base is intact. No atlanto-occipital dissociation. No acute cervical spine fracture identified. Soft tissues and spinal canal: No prevertebral fluid or swelling. No visible canal hematoma. Negative noncontrast neck soft tissues. Disc levels: Mild degenerative cervical spinal stenosis suspected at C3-C4 in part related to broad-based disc (series 302, image 62). Upper chest: Visible upper thoracic levels appear intact. Negative lung apices. Negative visualized superior noncontrast mediastinum. IMPRESSION: 1. No acute intracranial abnormality. Stable noncontrast CT appearance of the brain with extensive right worse than left chronic MCA territory ischemia. 2.  No acute fracture or listhesis identified in the cervical spine. 3. Mild degenerative cervical spinal stenosis suspected at C3-C4. Electronically  Signed   By: Genevie Ann M.D.   On: 11/12/2016 17:11   Dg Chest Portable 1 View  Result Date: 11/12/2016 CLINICAL DATA:  76 year old male status post fall. Dizziness, atrial fibrillation with rapid ventricular response. Lethargy. Initial encounter. EXAM: PORTABLE CHEST 1 VIEW COMPARISON:  08/12/2016 and earlier. FINDINGS: Portable AP semi upright view at 1547 hours. Stable mild-to-moderate cardiomegaly. Stable mild tortuosity of the thoracic aorta. Calcified aortic atherosclerosis. Other mediastinal contours are within normal limits. Visualized tracheal air column is within normal limits. Allowing for portable technique the lungs are clear. No pneumothorax. Negative visible bowel gas pattern. Chronic left lateral fourth rib fracture. IMPRESSION: No acute cardiopulmonary abnormality. Stable cardiomegaly.  Calcified aortic atherosclerosis. Electronically Signed   By: Genevie Ann M.D.   On: 11/12/2016 15:56    EKG: Independently reviewed. A. fib with RVR.  Assessment/Plan Principal Problem:   Atrial fibrillation with RVR (HCC) Active Problems:   Alcohol abuse    Liver cirrhosis (HCC)   DVT of lower limb, acute (HCC)   Thrombocytopenia (HCC)   Abnormal TSH   Hepatocellular carcinoma (HCC)   Cerebral infarction due to embolism of right middle cerebral artery (HCC) - s/p mechanical thrombectomy   Polysubstance abuse   Chronic systolic CHF (congestive heart failure) ([place])   ARF (acute renal failure) ([place])    1. A. fib with RVR - probably secondary to noncompliance with medications. Patient has not been taking his Apixaban or metoprolol. Patient is on Cardizem infusion at this time. Will restart metoprolol at 25 mg by mouth twice a day. Patient is to be on Apixaban which will be restarted. Chads 2 vasc score is 6. Closely observe for any GI bleed given history. 2. Acute renal failure with metabolic acidosis - not sure of the cause. Patient has had previous history of diarrhea but denies having any at this time. If patient's lactate level elevated will give fluid bolus. Follow metabolic panel and FENa. 3. Hypothyroidism with elevated TSH - probably noncompliant with Synthroid. Will restart Synthroid at 50 g by mouth daily home dose previously prescribed. 4. Leukopenia and thrombocytopenia probably from alcoholism and cirrhosis - follow CBC. 5. History of stroke with left-sided hemiparesis - will restart Apixaban. 6. History of cirrhosis of the liver with hepatitis C and alcoholism and hepatocellular carcinoma - patient is on lactulose. 7. History of DVT - will restart 8. History of nonischemic cardiomyopathy last EF measured in September 2017 was 40-45% - appears dry at this time. 9. History of alcoholism - patient states his last alcoholic drink was 10 days ago. Closely observe. I have placed patient on CIWA. Follow urine drug screen.  Addendum - following starting metoprolol patient became bradycardic with heart rate in the 44-46. Metoprolol on hold. I have consulted cardiology. Repeat EKG also shows prolonged QT. Magnesium has been replaced. Follow  metabolic panel and magnesium levels.  X-ray of the pelvis pending.   DVT prophylaxis: Apixaban. Code Status: Full code.  Family Communication: No family at the bedside.  Disposition Plan: Home.  Consults called: Cardiology.  Admission status: Observation.    Rise Patience MD [facility] Pager [phone].  If 7PM-7AM, please contact night-coverage [email or web address] Password [email or web address]  11/12/2016, 10:08 PM

## 2016-11-12 NOTE — ED Notes (Signed)
Attempted in and out cath, unsuccessful d/t enlarged prostate. Placed condom cath for specimen collecction

## 2016-11-12 NOTE — ED Notes (Signed)
Admitting team at bedside. Placed on hospital bed

## 2016-11-12 NOTE — ED Notes (Signed)
Attempted phleb stick x1, unsuccessful

## 2016-11-13 ENCOUNTER — Observation Stay (HOSPITAL_COMMUNITY): Payer: Medicare Other

## 2016-11-13 ENCOUNTER — Observation Stay (HOSPITAL_BASED_OUTPATIENT_CLINIC_OR_DEPARTMENT_OTHER): Payer: Medicare Other

## 2016-11-13 DIAGNOSIS — K219 Gastro-esophageal reflux disease without esophagitis: Secondary | ICD-10-CM | POA: Diagnosis present

## 2016-11-13 DIAGNOSIS — F329 Major depressive disorder, single episode, unspecified: Secondary | ICD-10-CM | POA: Diagnosis present

## 2016-11-13 DIAGNOSIS — I428 Other cardiomyopathies: Secondary | ICD-10-CM | POA: Diagnosis not present

## 2016-11-13 DIAGNOSIS — I252 Old myocardial infarction: Secondary | ICD-10-CM | POA: Diagnosis not present

## 2016-11-13 DIAGNOSIS — N179 Acute kidney failure, unspecified: Secondary | ICD-10-CM | POA: Diagnosis not present

## 2016-11-13 DIAGNOSIS — B1920 Unspecified viral hepatitis C without hepatic coma: Secondary | ICD-10-CM | POA: Diagnosis present

## 2016-11-13 DIAGNOSIS — K7030 Alcoholic cirrhosis of liver without ascites: Secondary | ICD-10-CM | POA: Diagnosis not present

## 2016-11-13 DIAGNOSIS — I110 Hypertensive heart disease with heart failure: Secondary | ICD-10-CM | POA: Diagnosis present

## 2016-11-13 DIAGNOSIS — D696 Thrombocytopenia, unspecified: Secondary | ICD-10-CM

## 2016-11-13 DIAGNOSIS — Z9114 Patient's other noncompliance with medication regimen: Secondary | ICD-10-CM | POA: Diagnosis not present

## 2016-11-13 DIAGNOSIS — I481 Persistent atrial fibrillation: Secondary | ICD-10-CM | POA: Diagnosis present

## 2016-11-13 DIAGNOSIS — I4892 Unspecified atrial flutter: Secondary | ICD-10-CM | POA: Diagnosis present

## 2016-11-13 DIAGNOSIS — R946 Abnormal results of thyroid function studies: Secondary | ICD-10-CM | POA: Diagnosis not present

## 2016-11-13 DIAGNOSIS — E7220 Disorder of urea cycle metabolism, unspecified: Secondary | ICD-10-CM | POA: Diagnosis present

## 2016-11-13 DIAGNOSIS — N390 Urinary tract infection, site not specified: Secondary | ICD-10-CM | POA: Diagnosis present

## 2016-11-13 DIAGNOSIS — I5022 Chronic systolic (congestive) heart failure: Secondary | ICD-10-CM

## 2016-11-13 DIAGNOSIS — F1020 Alcohol dependence, uncomplicated: Secondary | ICD-10-CM | POA: Diagnosis present

## 2016-11-13 DIAGNOSIS — K7460 Unspecified cirrhosis of liver: Secondary | ICD-10-CM | POA: Diagnosis present

## 2016-11-13 DIAGNOSIS — E872 Acidosis: Secondary | ICD-10-CM | POA: Diagnosis present

## 2016-11-13 DIAGNOSIS — D72819 Decreased white blood cell count, unspecified: Secondary | ICD-10-CM | POA: Diagnosis present

## 2016-11-13 DIAGNOSIS — R079 Chest pain, unspecified: Secondary | ICD-10-CM | POA: Diagnosis not present

## 2016-11-13 DIAGNOSIS — I4891 Unspecified atrial fibrillation: Secondary | ICD-10-CM

## 2016-11-13 DIAGNOSIS — Y92009 Unspecified place in unspecified non-institutional (private) residence as the place of occurrence of the external cause: Secondary | ICD-10-CM | POA: Diagnosis not present

## 2016-11-13 DIAGNOSIS — I5042 Chronic combined systolic (congestive) and diastolic (congestive) heart failure: Secondary | ICD-10-CM | POA: Diagnosis present

## 2016-11-13 DIAGNOSIS — R55 Syncope and collapse: Secondary | ICD-10-CM

## 2016-11-13 DIAGNOSIS — I429 Cardiomyopathy, unspecified: Secondary | ICD-10-CM | POA: Diagnosis present

## 2016-11-13 DIAGNOSIS — I69354 Hemiplegia and hemiparesis following cerebral infarction affecting left non-dominant side: Secondary | ICD-10-CM | POA: Diagnosis not present

## 2016-11-13 DIAGNOSIS — R319 Hematuria, unspecified: Secondary | ICD-10-CM | POA: Diagnosis not present

## 2016-11-13 DIAGNOSIS — E039 Hypothyroidism, unspecified: Secondary | ICD-10-CM | POA: Diagnosis present

## 2016-11-13 DIAGNOSIS — I248 Other forms of acute ischemic heart disease: Secondary | ICD-10-CM | POA: Diagnosis not present

## 2016-11-13 DIAGNOSIS — S299XXA Unspecified injury of thorax, initial encounter: Secondary | ICD-10-CM | POA: Diagnosis not present

## 2016-11-13 DIAGNOSIS — I452 Bifascicular block: Secondary | ICD-10-CM | POA: Diagnosis present

## 2016-11-13 DIAGNOSIS — K760 Fatty (change of) liver, not elsewhere classified: Secondary | ICD-10-CM | POA: Diagnosis present

## 2016-11-13 DIAGNOSIS — Z9119 Patient's noncompliance with other medical treatment and regimen: Secondary | ICD-10-CM | POA: Diagnosis not present

## 2016-11-13 DIAGNOSIS — Y900 Blood alcohol level of less than 20 mg/100 ml: Secondary | ICD-10-CM | POA: Diagnosis present

## 2016-11-13 DIAGNOSIS — F1010 Alcohol abuse, uncomplicated: Secondary | ICD-10-CM | POA: Diagnosis not present

## 2016-11-13 DIAGNOSIS — W1809XA Striking against other object with subsequent fall, initial encounter: Secondary | ICD-10-CM | POA: Diagnosis present

## 2016-11-13 LAB — URINALYSIS, ROUTINE W REFLEX MICROSCOPIC
Glucose, UA: NEGATIVE mg/dL
Ketones, ur: NEGATIVE mg/dL
Nitrite: NEGATIVE
Protein, ur: 100 mg/dL — AB
SPECIFIC GRAVITY, URINE: 1.023 (ref 1.005–1.030)
pH: 5 (ref 5.0–8.0)

## 2016-11-13 LAB — SODIUM, URINE, RANDOM: SODIUM UR: 13 mmol/L

## 2016-11-13 LAB — LACTIC ACID, PLASMA: Lactic Acid, Venous: 3.1 mmol/L (ref 0.5–1.9)

## 2016-11-13 LAB — ECHOCARDIOGRAM COMPLETE
FS: 26 % — AB (ref 28–44)
HEIGHTINCHES: 69 in
IV/PV OW: 0.75
LA diam end sys: 42 mm
LA vol index: 34.1 mL/m2
LADIAMINDEX: 2.13 cm/m2
LASIZE: 42 mm
LAVOL: 67.2 mL
LAVOLA4C: 52.5 mL
LDCA: 4.15 cm2
LV PW d: 8.72 mm — AB (ref 0.6–1.1)
LVOTD: 23 mm
WEIGHTICAEL: 2874.8 [oz_av]

## 2016-11-13 LAB — MRSA PCR SCREENING: MRSA BY PCR: NEGATIVE

## 2016-11-13 LAB — BASIC METABOLIC PANEL
Anion gap: 16 — ABNORMAL HIGH (ref 5–15)
BUN: 10 mg/dL (ref 6–20)
CHLORIDE: 102 mmol/L (ref 101–111)
CO2: 17 mmol/L — ABNORMAL LOW (ref 22–32)
CREATININE: 1.55 mg/dL — AB (ref 0.61–1.24)
Calcium: 8.2 mg/dL — ABNORMAL LOW (ref 8.9–10.3)
GFR calc Af Amer: 48 mL/min — ABNORMAL LOW (ref >60)
GFR, EST NON AFRICAN AMERICAN: 42 mL/min — AB (ref >60)
Glucose, Bld: 137 mg/dL — ABNORMAL HIGH (ref 65–99)
Potassium: 3 mmol/L — ABNORMAL LOW (ref 3.5–5.1)
SODIUM: 135 mmol/L (ref 135–145)

## 2016-11-13 LAB — CBC
HEMATOCRIT: 43 % (ref 39.0–52.0)
HEMOGLOBIN: 15.5 g/dL (ref 13.0–17.0)
MCH: 33.4 pg (ref 26.0–34.0)
MCHC: 36 g/dL (ref 30.0–36.0)
MCV: 92.7 fL (ref 78.0–100.0)
Platelets: 78 10*3/uL — ABNORMAL LOW (ref 150–400)
RBC: 4.64 MIL/uL (ref 4.22–5.81)
RDW: 18.4 % — ABNORMAL HIGH (ref 11.5–15.5)
WBC: 7.7 10*3/uL (ref 4.0–10.5)

## 2016-11-13 LAB — RAPID URINE DRUG SCREEN, HOSP PERFORMED
AMPHETAMINES: NOT DETECTED
BARBITURATES: NOT DETECTED
BENZODIAZEPINES: NOT DETECTED
COCAINE: NOT DETECTED
Opiates: NOT DETECTED
TETRAHYDROCANNABINOL: NOT DETECTED

## 2016-11-13 LAB — CREATININE, URINE, RANDOM: CREATININE, URINE: 377.44 mg/dL

## 2016-11-13 LAB — T4, FREE: FREE T4: 1.2 ng/dL — AB (ref 0.61–1.12)

## 2016-11-13 LAB — TROPONIN I
TROPONIN I: 0.05 ng/mL — AB (ref <0.03)
TROPONIN I: 0.04 ng/mL — AB (ref <0.03)

## 2016-11-13 LAB — MAGNESIUM: MAGNESIUM: 1.7 mg/dL (ref 1.7–2.4)

## 2016-11-13 MED ORDER — POTASSIUM CHLORIDE CRYS ER 20 MEQ PO TBCR
40.0000 meq | EXTENDED_RELEASE_TABLET | Freq: Once | ORAL | Status: AC
  Administered 2016-11-13: 40 meq via ORAL
  Filled 2016-11-13: qty 2

## 2016-11-13 MED ORDER — LEVOTHYROXINE SODIUM 50 MCG PO TABS
50.0000 ug | ORAL_TABLET | Freq: Every day | ORAL | Status: DC
  Administered 2016-11-13 – 2016-11-15 (×4): 50 ug via ORAL
  Filled 2016-11-13 (×5): qty 1

## 2016-11-13 MED ORDER — ENSURE ENLIVE PO LIQD
237.0000 mL | Freq: Two times a day (BID) | ORAL | Status: DC
  Administered 2016-11-13 – 2016-11-15 (×4): 237 mL via ORAL

## 2016-11-13 MED ORDER — SODIUM CHLORIDE 0.9 % IV BOLUS (SEPSIS)
1000.0000 mL | Freq: Once | INTRAVENOUS | Status: AC
  Administered 2016-11-13: 1000 mL via INTRAVENOUS

## 2016-11-13 MED ORDER — MAGNESIUM SULFATE 2 GM/50ML IV SOLN
2.0000 g | Freq: Once | INTRAVENOUS | Status: AC
  Administered 2016-11-13: 2 g via INTRAVENOUS
  Filled 2016-11-13: qty 50

## 2016-11-13 MED ORDER — POTASSIUM CHLORIDE CRYS ER 20 MEQ PO TBCR
40.0000 meq | EXTENDED_RELEASE_TABLET | Freq: Two times a day (BID) | ORAL | Status: AC
  Administered 2016-11-13 (×2): 40 meq via ORAL
  Filled 2016-11-13 (×2): qty 2

## 2016-11-13 MED ORDER — POTASSIUM CHLORIDE IN NACL 20-0.9 MEQ/L-% IV SOLN
INTRAVENOUS | Status: DC
  Administered 2016-11-13 (×2): via INTRAVENOUS
  Filled 2016-11-13 (×5): qty 1000

## 2016-11-13 MED ORDER — APIXABAN 5 MG PO TABS
5.0000 mg | ORAL_TABLET | Freq: Two times a day (BID) | ORAL | Status: DC
  Administered 2016-11-13 – 2016-11-15 (×5): 5 mg via ORAL
  Filled 2016-11-13 (×5): qty 1

## 2016-11-13 NOTE — Consult Note (Signed)
Cardiology Consult    Patient ID: Nathan Frank MRN: [id number], DOB/AGE: 77-Jul-1942   Admit date: 11/12/2016 Date of Consult: 11/13/2016  Primary Physician: Thressa Sheller, MD Primary Cardiologist: Dr. Harrington Challenger Requesting Provider: Dr. Allyson Sabal  Patient Profile    Nathan Frank is a 76 yo male with a PMH significant for atrial fibrillation (on eliquis), liver cirrhosis, Hep C, hx of GI bleed, DVT, alcohol and heroin abuse, hx of stroke with mild left-sided weakness, and nonischemic cardiomyopathy. He was brought to the ED following a fall at home and was found to be in Afib RVR, likely secondary to medication non-compliance. Cardiology was consulted for further management of his Afib.  Past Medical History   Past Medical History:  Diagnosis Date  . Abnormal TSH   . Acute gastric ulcer   . Acute gastritis with hemorrhage   . Alcohol abuse   . Alcohol dependence ([place]) 02/02/2014  . Anemia   . Bifascicular block   . Cirrhosis ([place]) 06/04/2012  . Depressive disorder 02/01/2014  . Dizziness and giddiness 12/13/2014  . DVT of lower limb, acute ([place]) 06/06/2012  . DVT, lower extremity, recurrent ([place])    a. noted 2013. b. also dx 06/2016.  Marland Kitchen Essential hypertension   . Fatty liver   . Gallstones   . Gastritis 06/07/2012  . GERD (gastroesophageal reflux disease)   . GI bleed due to NSAIDs 12/13/2014  . Granulomatous gastritis   . Hematuria 06/04/2012  . Hepatitis C   . Hepatocellular carcinoma ([place])   . Heroin abuse    "I haven't done that since I don't know when."  . Heroin overdose 02/20/2014  . Neuropathy ([place])   . NICM (nonischemic cardiomyopathy) ([place])    a. 04/2015: EF 45-50% by cath. b. EF 40-45% by echo 06/2016.  . NSTEMI (non-ST elevated myocardial infarction) ([place])    a. 04/2015 - patent coronaries. Etiology possibly due to coronary spasm versus embolus, stress cardiomyopathy (atypical), and aborted infarction related to plaque rupture with thrombosis and dissolution. Amlodipine started. Not  on antiplatelets due to GIB/cirrhosis history.  . Oral thrush 06/05/2012  . Polysubstance abuse    THC, alcohol, heroin  . Prolonged Q-T interval on ECG    a. 12/2014 - treated with magnesium.  . Right knee pain 12/13/2014  . S/P alcohol detoxification 02/02/2014  . Stroke ([place]) 06/2016  . SVT (supraventricular tachycardia) ([place])    a. 12/2014 in setting of GIB, ETOH, NSAIDS, gastritis.  . Symptomatic cholelithiasis 12/15/2013  . Thrombocytopenia ([place])   . Upper GI bleeding 12/13/2014  . Weight loss 06/04/2012    Past Surgical History:  Procedure Laterality Date  . CARDIAC CATHETERIZATION N/A 04/15/2015   Procedure: Left Heart Cath and Coronary Angiography;  Surgeon: Belva Crome, MD;  Location: [facility];  Service: Cardiovascular;  Laterality: N/A;  . CIRCUMCISION    . ESOPHAGOGASTRODUODENOSCOPY  06/07/2012   Procedure: ESOPHAGOGASTRODUODENOSCOPY (EGD);  Surgeon: Milus Banister, MD;  Location: [facility];  Service: Endoscopy;  Laterality: N/A;  may need treatment of varices  . ESOPHAGOGASTRODUODENOSCOPY N/A 12/14/2014   Procedure: ESOPHAGOGASTRODUODENOSCOPY (EGD);  Surgeon: Jerene Bears, MD;  Location: [facility];  Service: Endoscopy;  Laterality: N/A;  . IR GENERIC HISTORICAL  06/25/2016   IR US GUIDE VASC ACCESS LEFT 06/25/2016 Corrie Mckusick, DO [facility]  . IR GENERIC HISTORICAL  06/25/2016   IR RADIOLOGY PERIPHERAL GUIDED IV START 06/25/2016 Corrie Mckusick, DO [facility]  . IR GENERIC HISTORICAL  06/25/2016   IR  PERCUTANEOUS ART THROMBECTOMY/INFUSION INTRACRANIAL INC DIAG ANGIO 06/25/2016 Luanne Bras, MD [facility]  . RADIOLOGY WITH ANESTHESIA N/A 06/25/2016   Procedure: RADIOLOGY WITH ANESTHESIA;  Surgeon: Luanne Bras, MD;  Location: [facility];  Service: Radiology;  Laterality: N/A;  . TONSILLECTOMY       Allergies  Allergies  Allergen Reactions  . Nsaids Other (See Comments)    Acute gastric ulcer  . Oxycodone Other (See Comments)    as per the risk  calculating tool RIOSORD (Risk Index for Overdose or Serious Opioid -induced Respiratory Depression Risk ) calculated risk in ensuing 6 months  = 83% ( see Problem List for discussion)    History of Present Illness    Nathan Frank is well-known to this service. He has a history of NSTEMI (04/2015 possibly due to coronary spasm versus embolus or aborted infarct with lysis 04/2015), cardiomyopathy (last Ef: 40-45%), SVT, and paroxysmal atrial fibrillation/flutter. He was hospitalized on 08/09/16 for rapid heart rate and again on 08/12/16 for a mechanical fall and was found to be in Afib RVR. He was last seen in clinic on 08/20/16 and was noted to be noncompliant with his cardiac medications (eliquis, lopressor, and spironolactone). Reviewed with EP for possible Watchman, but it was felt he was not a good candidate given his comorbidities and medication noncompliance.  He was sent to ED from clinic for RVR and hypotension (08/20/16 - 08/23/16). He was discharged to a SNF, but later left SNF AMA (09/04/16). He was evaluated for Afib RVR at that hospitalization and was restarted on home medications (lopressor). On this admission for another mechanical fall at home, he was found to be in Afib RVR and was given diltiazem bolus. He is now in rate-controlled Afib and continues to be asymptomatic. On my interview, he is asymptomatic, denies chest pain, SOB, dizziness, lightheadedness, palpitations, cough, fever, chills, nausea and vomiting. His only complaint is ongoing knee pain that he attributes to his home lactulose dose; this is not new pain.   Inpatient Medications    . apixaban  5 mg Oral BID  . feeding supplement (ENSURE ENLIVE)  237 mL Oral BID BM  . folic acid  1 mg Oral Daily  . levothyroxine  50 mcg Oral QAC breakfast  . multivitamin with minerals  1 tablet Oral Daily  . potassium chloride  40 mEq Oral BID  . thiamine  100 mg Oral Daily   Or  . thiamine  100 mg Intravenous Daily     Outpatient  Medications    Prior to Admission medications   Medication Sig Start Date End Date Taking? Authorizing Provider  Multiple Vitamin (MULTIVITAMIN WITH MINERALS) TABS tablet Take 1 tablet by mouth daily. 04/21/16  Yes Maryann Mikhail, DO  traMADol (ULTRAM) 50 MG tablet Take 50 mg by mouth 3 (three) times daily as needed for pain. 11/02/16  Yes Historical Provider, MD  amitriptyline (ELAVIL) 10 MG tablet Take 1 tablet (10 mg total) by mouth at bedtime. Patient not taking: Reported on 11/12/2016 07/30/16   Ivan Anchors Love, PA-C  apixaban (ELIQUIS) 5 MG TABS tablet Take 1 tablet (5 mg total) by mouth 2 (two) times daily. Patient not taking: Reported on 11/12/2016 08/09/16   Patrecia Pour, MD  folic acid (FOLVITE) 1 MG tablet Take 1 tablet (1 mg total) by mouth daily. Patient not taking: Reported on 11/12/2016 04/21/16   Cristal Ford, DO  levothyroxine (SYNTHROID, LEVOTHROID) 50 MCG tablet Take 1 tablet (50 mcg total) by mouth daily before  breakfast. Patient not taking: Reported on 11/12/2016 06/16/16   Florencia Reasons, MD  magnesium oxide (MAG-OX) 400 (241.3 Mg) MG tablet Take 1 tablet (400 mg total) by mouth daily. Patient not taking: Reported on 11/12/2016 06/03/16   Verlee Monte, MD  metoprolol (LOPRESSOR) 50 MG tablet Take 1 tablet (50 mg total) by mouth 3 (three) times daily. Patient not taking: Reported on 11/12/2016 07/30/16   Ivan Anchors Love, PA-C  midodrine (PROAMATINE) 10 MG tablet Take 10 mg by mouth 3 (three) times daily with meals. 08/24/16   Historical Provider, MD  thiamine 100 MG tablet Take 1 tablet (100 mg total) by mouth daily. Patient not taking: Reported on 11/12/2016 08/24/16   Shon Millet, DO     Family History    Family History  Problem Relation Age of Onset  . Stroke Father   . Prostate cancer Brother   . Heart disease Mother     Pacemaker  . Diabetes Neg Hx     Social History    Social History   Social History  . Marital status: Married    Spouse name: N/A  . Number of  children: 3  . Years of education: N/A   Occupational History  . Retired Education officer, museum    Social History Main Topics  . Smoking status: Former Smoker    Packs/day: 0.50    Years: 5.00    Types: Cigarettes  . Smokeless tobacco: Never Used     Comment: "quit smoking cigarettes in the 1970's"  . Alcohol use No     Comment: 06/2016 " I quit drinking a couple of weeks ago "  . Drug use: No     Comment: former heroin user-- last used 3 months ago-  . Sexual activity: Not Currently    Birth control/ protection: Condom   Other Topics Concern  . Not on file   Social History Narrative   Lost one son to a gunshot.  Lives with wife.       Review of Systems    General:  No chills, fever, night sweats or weight changes.  Cardiovascular:  No chest pain, dyspnea on exertion, edema, orthopnea, palpitations, paroxysmal nocturnal dyspnea. Dermatological: No rash, lesions/masses Respiratory: No cough, dyspnea Urologic: No hematuria, dysuria Abdominal:   No nausea, vomiting, diarrhea, bright red blood per rectum, melena, or hematemesis Neurologic:  No visual changes, changes in mental status. All other systems reviewed and are otherwise negative except as noted above.  Physical Exam    Blood pressure 92/76, pulse 67, temperature 97.6 F (36.4 C), temperature source Oral, resp. rate 11, height 5\' 9"  (1.753 m), weight 179 lb 10.8 oz (81.5 kg), SpO2 97 %.  General: Pleasant, NAD, poor historian Psych: Normal affect. Neuro: Alert and oriented X 3. Moves all extremities spontaneously. HEENT: Normal  Neck: Supple without bruits or JVD. Lungs:  Resp regular and unlabored, CTA. Heart: Irregular rhythm, regular rate no murmurs. Abdomen: Soft, non-tender, non-distended, BS + x 4.  Extremities: No clubbing, cyanosis, Trace to 1+ B LE edema. DP/PT/Radials 1+ and equal bilaterally.  Labs    Troponin [facility] Test)  Recent Labs  11/12/16 2153  TROPIPOC 0.04    Recent Labs   11/12/16 2204 11/13/16 0331 11/13/16 1010  TROPONINI 0.05* 0.05* 0.04*   Lab Results  Component Value Date   WBC 7.7 11/13/2016   HGB 15.5 11/13/2016   HCT 43.0 11/13/2016   MCV 92.7 11/13/2016   PLT 78 (L) 11/13/2016  Recent Labs Lab 11/12/16 1619 11/13/16 0331  NA 140 135  K 3.4* 3.0*  CL 104 102  CO2 18* 17*  BUN 9 10  CREATININE 1.44* 1.55*  CALCIUM 8.7* 8.2*  PROT 7.2  --   BILITOT 2.6*  --   ALKPHOS 96  --   ALT 53  --   AST 177*  --   GLUCOSE 130* 137*   Lab Results  Component Value Date   CHOL 87 06/26/2016   HDL 26 (L) 06/26/2016   LDLCALC 38 06/26/2016   TRIG 91 06/27/2016   Lab Results  Component Value Date   DDIMER 2.97 (H) 06/09/2016     Radiology Studies    Ct Head Wo Contrast  Result Date: 11/12/2016 CLINICAL DATA:  76 year old male status post fall on blood thinners. Initial encounter. EXAM: CT HEAD WITHOUT CONTRAST CT CERVICAL SPINE WITHOUT CONTRAST TECHNIQUE: Multidetector CT imaging of the head and cervical spine was performed following the standard protocol without intravenous contrast. Multiplanar CT image reconstructions of the cervical spine were also generated. COMPARISON:  Head CT without contrast 08/20/2016. Head and cervical spine CT 08/12/2016. FINDINGS: CT HEAD FINDINGS Brain: Extensive right MCA territory encephalomalacia re - demonstrated and largely related to the September 2017 infarct. Superimposed chronic posterior left MCA territory encephalomalacia. Stable cerebral volume. Stable gray-white matter differentiation throughout the brain. No acute intracranial hemorrhage identified. No midline shift, mass effect, or evidence of intracranial mass lesion. No ventriculomegaly. No new cortically based infarct identified. Vascular: Calcified atherosclerosis at the skull base. Skull: Intact.  No acute osseous abnormality identified. Sinuses/Orbits: Visualized paranasal sinuses and mastoids are stable and well pneumatized. Other: No acute  orbit or scalp soft tissue finding. CT CERVICAL SPINE FINDINGS Alignment: Improved cervical lordosis compared to November. Cervicothoracic junction alignment is within normal limits. Bilateral posterior element alignment is within normal limits. Skull base and vertebrae: Visualized skull base is intact. No atlanto-occipital dissociation. No acute cervical spine fracture identified. Soft tissues and spinal canal: No prevertebral fluid or swelling. No visible canal hematoma. Negative noncontrast neck soft tissues. Disc levels: Mild degenerative cervical spinal stenosis suspected at C3-C4 in part related to broad-based disc (series 302, image 62). Upper chest: Visible upper thoracic levels appear intact. Negative lung apices. Negative visualized superior noncontrast mediastinum. IMPRESSION: 1. No acute intracranial abnormality. Stable noncontrast CT appearance of the brain with extensive right worse than left chronic MCA territory ischemia. 2.  No acute fracture or listhesis identified in the cervical spine. 3. Mild degenerative cervical spinal stenosis suspected at C3-C4. Electronically Signed   By: Genevie Ann M.D.   On: 11/12/2016 17:11   Ct Cervical Spine Wo Contrast  Result Date: 11/12/2016 CLINICAL DATA:  76 year old male status post fall on blood thinners. Initial encounter. EXAM: CT HEAD WITHOUT CONTRAST CT CERVICAL SPINE WITHOUT CONTRAST TECHNIQUE: Multidetector CT imaging of the head and cervical spine was performed following the standard protocol without intravenous contrast. Multiplanar CT image reconstructions of the cervical spine were also generated. COMPARISON:  Head CT without contrast 08/20/2016. Head and cervical spine CT 08/12/2016. FINDINGS: CT HEAD FINDINGS Brain: Extensive right MCA territory encephalomalacia re - demonstrated and largely related to the September 2017 infarct. Superimposed chronic posterior left MCA territory encephalomalacia. Stable cerebral volume. Stable gray-white matter  differentiation throughout the brain. No acute intracranial hemorrhage identified. No midline shift, mass effect, or evidence of intracranial mass lesion. No ventriculomegaly. No new cortically based infarct identified. Vascular: Calcified atherosclerosis at the skull base. Skull: Intact.  No acute osseous abnormality identified. Sinuses/Orbits: Visualized paranasal sinuses and mastoids are stable and well pneumatized. Other: No acute orbit or scalp soft tissue finding. CT CERVICAL SPINE FINDINGS Alignment: Improved cervical lordosis compared to November. Cervicothoracic junction alignment is within normal limits. Bilateral posterior element alignment is within normal limits. Skull base and vertebrae: Visualized skull base is intact. No atlanto-occipital dissociation. No acute cervical spine fracture identified. Soft tissues and spinal canal: No prevertebral fluid or swelling. No visible canal hematoma. Negative noncontrast neck soft tissues. Disc levels: Mild degenerative cervical spinal stenosis suspected at C3-C4 in part related to broad-based disc (series 302, image 62). Upper chest: Visible upper thoracic levels appear intact. Negative lung apices. Negative visualized superior noncontrast mediastinum. IMPRESSION: 1. No acute intracranial abnormality. Stable noncontrast CT appearance of the brain with extensive right worse than left chronic MCA territory ischemia. 2.  No acute fracture or listhesis identified in the cervical spine. 3. Mild degenerative cervical spinal stenosis suspected at C3-C4. Electronically Signed   By: Genevie Ann M.D.   On: 11/12/2016 17:11   Dg Pelvis Portable  Result Date: 11/13/2016 CLINICAL DATA:  Pain following fall EXAM: PORTABLE PELVIS 1-2 VIEWS COMPARISON:  None. FINDINGS: There is no evidence of pelvic fracture or dislocation. There is slight symmetric narrowing of both hip joints. No erosive change. IMPRESSION: Slight symmetric narrowing both hip joints. No fracture or  dislocation. Electronically Signed   By: Lowella Grip III M.D.   On: 11/13/2016 08:58   Dg Chest Portable 1 View  Result Date: 11/12/2016 CLINICAL DATA:  76 year old male status post fall. Dizziness, atrial fibrillation with rapid ventricular response. Lethargy. Initial encounter. EXAM: PORTABLE CHEST 1 VIEW COMPARISON:  08/12/2016 and earlier. FINDINGS: Portable AP semi upright view at 1547 hours. Stable mild-to-moderate cardiomegaly. Stable mild tortuosity of the thoracic aorta. Calcified aortic atherosclerosis. Other mediastinal contours are within normal limits. Visualized tracheal air column is within normal limits. Allowing for portable technique the lungs are clear. No pneumothorax. Negative visible bowel gas pattern. Chronic left lateral fourth rib fracture. IMPRESSION: No acute cardiopulmonary abnormality. Stable cardiomegaly.  Calcified aortic atherosclerosis. Electronically Signed   By: Genevie Ann M.D.   On: 11/12/2016 15:56    ECG & Cardiac Imaging    EKG 11/12/16: wide complex tachycardia with ST depressions in leads V2-V6 EKG 11/12/16: Afib RVR, ST depressions in leads V2-V6 EKG 11/13/16: Afib, ventricular rate 68  Echocardiogram 06/16/16: Study Conclusions - Left ventricle: Posterior , septal and apical hypokinesis The   cavity size was mildly dilated. Wall thickness was normal.   Systolic function was mildly to moderately reduced. The estimated   ejection fraction was in the range of 40% to 45%. Left   ventricular diastolic function parameters were normal. - Mitral valve: There was mild regurgitation. - Atrial septum: No defect or patent foramen ovale was identified.  Heart catheterization 04/15/15:  Coronary arteries are widely patent. The right coronary, LAD, ramus, and circumflex are very tortuous.  Low normal to mildly depressed LV systolic function with an estimated EF of 45-50%. No regional wall motion abnormalities are noted.  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  Elevated myocardial markers for injury raises the possibility of coronary spasm, embolus, stress cardiomyopathy (atypical), and aborted infarction related to plaque rupture with thrombosis and dissolution.    Assessment & Plan    1. Atrial fibrillation with RVR - cardizem bolus given in ED with conversion to rate controlled  Afib in the 70s; not currently on a beta blocker - restart home lopressor at lower dose of 25 mg TID and titrate up as pressure allows - primary team restarted his anticoagulation (apixaban); given his hx of stroke, DVTs, and GI bleed, monitor closely for bleeding; Hb 15.5, PLT 78 - Mg 1.7 (1.6), recommend keeping Mg > 2.0 - K 3.0 (3.4), recommend keeping K near 4.0 - given his history of medication noncompliance, would likely not be a cardioversion candidate in which he would need to be anticoagulation for 4 weeks following cardioversion - he denies chest pain   2. Hypotension - recommend restarting home midodrine for pressure support; he has chronically been hypotensive during previous hospitalizations and OP clinic visits. - hold spironolactone at this time - recommend checking orthostatics   3. Nonischemic cardiomyopathy - Last EF: 40-45% (06/2016) - ST depressions on EKG likely demand ischemia in the presence of NICM and Afib RVR - pt has some lower extremity edema but not markedly volume overloaded; likely diurese after lopressor dose has been titrated and if his pressure allows - weight is 179 lbs today, baseline appears close to 195 lbs (dry weight is difficult to determine; at last clinic visit, patient was unable to stand on scales)   4. Acute renal injury - sCr 1.55 (baseline 0.79-0.91), with low urine output - per primary team   5. Polysubstance abuse - per primary team   6. Hx of stroke with residual deficits - monitor closely for bleeding or new  deficits in the presence of eqliquis    Signed, Minette Brine, PA-C 11/13/2016, 4:42 PM  Patient seen and examined. Agree with assessment and plan.  Nathan. Deutschman is a 76 year old African-American male with a complex medical history consisting of remote non-ST segment elevation MI, felt possibly due to coronary spasm versus embolus, cardiomyopathy with EF of 40-45%, persistent atrial fibrillation with poor medical compliance, cirrhosis, hepatitis C, history of GI bleeds, DVT, history of EtOH, and remotely had used heroin, history of stroke with mild residual left-sided weakness.  He had fallen yesterday at home and was brought by EMS and was found to be in AF with rapid ventricular response.  Patient has had issues in the past with low blood pressure and has been on Midrin so that he would be able to take low-dose beta blocker therapy to help control his AF and ventricular rate.  His ECG yesterday presentation showed atrial fibrillation at 152 bpm with right portal branch block but also showed few's ST segment changes suspicious for rate related ischemia.  His subsequent ECG with rate slowing has shown atrial fibrillation with rate at 68 with right bundle branch block and resolution of the diffuse inferolateral ST segment depression.  He is not having any chest pain symptomatology.  He has a history of hypothyroidism, and recently had been on eliquis anticoagulation with recurrent episodes of atrial fibrillation.  Laboratories notable for thrombocytopenia with a platelet count 78,000, and he denies any current bleeding.  At present, would resume low-dose beta blocker as blood pressure allows.  Recommend supplemental magnesium with a goal of 2.0 and repletion of potassium with his hypokalemia with a goal of at least 4.  He has stage III chronic kidney disease with a creatinine of 1.55.  We will follow patient with you.  Troy Sine, MD, [facility] 11/13/2016 5:18 PM

## 2016-11-13 NOTE — Consult Note (Signed)
   [facility] CM Inpatient Consult   11/13/2016  STEPAN BRODERSON 29-Mar-1941 [id number]     Came to visit Mr. Aida Puffer at bedside to discuss re-engaging [facility] program. He emphatically states, "yes, I need your services". Discussed that [facility] has been attempting to reach him but have been unsuccessful. He states his wife's number has changed but he does not know what it is. States his cell number is [phone]. He is agreeable to [facility] services. Explained that these services will not interfere or replace services provided by home health. [facility] Care Management active consent remains on file. Will request for Mr. Garno to be reassigned to [place].  Mr. Jafri has had x 7 hospitalizations in the past 6 months with multiple co-morbidities. Will ask for [facility] RNCM to be assigned as well as [facility] Pharmacist for medication non-compliance.    Marthenia Rolling, MSN-Ed, RN,BSN [facility] Liaison [phone]

## 2016-11-13 NOTE — Care Management Obs Status (Signed)
[facility] NOTIFICATION   Patient Details  Name: Nathan Frank MRN: [id number] Date of Birth: July 23, 1941   Medicare Observation Status Notification Given:  Yes    Lacretia Leigh, RN 11/13/2016, 9:58 AM

## 2016-11-13 NOTE — Progress Notes (Signed)
  Echocardiogram 2D Echocardiogram has been performed.  Johny Chess 11/13/2016, 4:57 PM

## 2016-11-13 NOTE — Progress Notes (Signed)
Initial Nutrition Assessment  DOCUMENTATION CODES:   Not applicable  INTERVENTION:   -Ensure Enlive po BID, each supplement provides 350 kcal and 20 grams of protein -Continue MVI daily -Downgrade diet to dysphagia 2 (mechanical soft consistency) for ease of intake  NUTRITION DIAGNOSIS:   Inadequate oral intake related to poor appetite (masticatory difficulty) as evidenced by meal completion < 25%.  GOAL:   Patient will meet greater than or equal to 90% of their needs  MONITOR:   PO intake, Supplement acceptance, Weight trends, Labs, Skin, I & O's  REASON FOR ASSESSMENT:   Malnutrition Screening Tool    ASSESSMENT:   Nathan Frank is a 76 y.o. male with history of cirrhosis of the liver, hepatitis C, alcohol abuse, nonischemic cardiomyopathy, stroke with left-sided hemiparesis, hypothyroidism, medical noncompliance was brought to the ER after patient had a fall at his home.  Pt admitted with a-fib with RVR.   Spoke with pt at bedside, who reports poor appetite and weight loss over the past month. He estimates that he has lost 10-15# over the past 4-6 weeks, which he attributes to taste changes ("everything tastes too salty"). Lunch tray in front of pt revealed less that 25% percent completion (hamburger and tomato soup). Pt reports the hamburger is "too hard for him" and has difficulty chewing harder textured food due to poor dentition. He is amenable to a mechanically altered diet for ease of intake.   Pt reports he started consuming Boost supplements at home to optimize nutritional status (prefers strawberry flavor). He consumed an entire Ensure prior to RD visit. Discussed importance of good meal and supplement intake to promote healing. Pt amenable to continue Ensure Enlive supplements, due to increased protein content compared to Boost supplements.   Wt hx revealed a 9.5% wt loss over the past 3 months, which is significant for time frame. However, due to fluctuating wt  hx, wt changes difficult to assess. Pt is at nutritional risk given poor oral intake, taste changes, poor dentition, and ETOH abuse.   Nutrition-Focused physical exam completed. Findings are no fat depletion, no muscle depletion, and mild edema.   Labs reviewed: K: 3.0 (on PO and IV supplementation).   Diet Order:  DIET DYS 2 Room service appropriate? Yes; Fluid consistency: Thin  Skin:  Reviewed, no issues  Last BM:  PTA  Height:   Ht Readings from Last 1 Encounters:  11/13/16 5\' 9"  (1.753 m)    Weight:   Wt Readings from Last 1 Encounters:  11/13/16 179 lb 10.8 oz (81.5 kg)    Ideal Body Weight:  72.7 kg  BMI:  Body mass index is 26.53 kg/m.  Estimated Nutritional Needs:   Kcal:  1800-2000  Protein:  95-110 grams  Fluid:  1.8-2.0 L  EDUCATION NEEDS:   Education needs addressed  Natilee Gauer A. Jimmye Norman, RD, LDN, CDE Pager: [phone] After hours Pager: [phone]

## 2016-11-13 NOTE — Care Management Note (Addendum)
Case Management Note  Patient Details  Name: Nathan Frank MRN: [id number] Date of Birth: 1941/03/09  Subjective/Objective:        Adm w at fib            Action/Plan: lives at home   Expected Discharge Date:                  Expected Discharge Plan:  Home/Self Care  In-House Referral:     Discharge planning Services  CM Consult, Medication Assistance  Post Acute Care Choice:    Choice offered to:     DME Arranged:    DME Agency:     HH Arranged:    HH Agency:     Status of Service:  In process, will continue to follow  If discussed at Long Length of Stay Meetings, dates discussed:    Additional Comments: pt has [employer] medicare for meds. Gave pt 30day free eliquis card. Had been on pta so may have used card in past.S/W  [place] @ [place] # [id number] 5 MG BID   COVER- YES  CO-PAY- $ 8.35  TIER- 3 DRUG  PRIOR APPROVAL- NO  PHARMACY : [facility]  Lacretia Leigh, RN 11/13/2016, 10:45 AM

## 2016-11-13 NOTE — Progress Notes (Signed)
   11/13/16 1142  Clinical Encounter Type  Visited With Patient  Visit Type Initial  Referral From Nurse  Consult/Referral To Chaplain  Stress Factors  Patient Stress Factors None identified  Pt declined, did not request Chaplain services, spiritual support.  Advised to call if assistance is further needed.  Chaplain Akoni Parton A. Nonnie Done , MA-PC , BA-REL/PHIL [phone]

## 2016-11-13 NOTE — Progress Notes (Signed)
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 male with history of cirrhosis of the liver, hepatitis C, alcohol abuse, nonischemic cardiomyopathy, stroke with left-sided hemiparesis, hypothyroidism, medical noncompliance was brought to the ER after patient had a fall at his home. Patient states he was trying to walk to the bathroom when he tripped on a rug and fell on the bed. CT of the head and C-spine was unremarkable. Patient was started on Cardizem infusion after bolus for A. fib with RVR. Patient states he only takes lactulose and tramadol for his known history of cirrhosis but has not been taking any of his other medications for A. fib or hypothyroidism. Patient states he was not prescribed. Patient states his last alcoholic drink was 10 days ago.   Patient was started on Cardizem infusion after bolus for A. fib with RVR. EKG shows A. fib with RVR. Chest x-ray unremarkable. CT head and C-spine was unremarkable. Labs also revealed metabolic acidosis with acute renal failure.  Assessment/Plan 1. A. fib with RVR - probably secondary to noncompliance with medications. Patient has not been taking his Apixaban or metoprolol. Patient  was on Cardizem infusion held due to low BP .  Patient is   on Apixaban  . Chads 2 vasc score is 6. Closely observe for any GI bleed given history.Cardiology to consult .repeat echo ,last EF  40-45% 2. Acute renal failure with metabolic acidosis - not sure of the cause. Patient has had previous history of diarrhea but denies having any at this time. If patient's lactate level elevated will give fluid bolus. Follow metabolic panel and FENa. 3. Hypothyroidism with elevated TSH ,check free T4- probably noncompliant with Synthroid. Will restart Synthroid at 50 g by mouth daily home dose previously prescribed. 4. Leukopenia and thrombocytopenia probably from alcoholism and cirrhosis - follow CBC. 5. History of stroke with left-sided hemiparesis - will restart Apixaban. 6. History of cirrhosis of the liver with hepatitis C and alcoholism and hepatocellular carcinoma - patient is on lactulose. 7. History of DVT - will restart 8. History of nonischemic cardiomyopathy last EF measured in September 2017 was 40-45% - appears dry at this time. 9. History of alcoholism - patient states his last alcoholic drink was 10 days ago. Closely observe. I have placed patient on CIWA. Follow urine drug screen.    DVT prophylaxsis eliquis  Code Status:  Full code      Code Status Orders      Family Communication: Discussed in detail with the patient, all imaging results, lab results explained to the patient   Disposition Plan:  Cardiology consult       Consultants: Cardiology  Procedures:  None   Antibiotics: Anti-infectives    None         HPI/Subjective: Slow a fib, bp in the 80's , asx , no cp, no sob  Objective: Vitals:  11/13/16 0520 11/13/16 0742 11/13/16 1113 11/13/16 1500  BP: 97/77 102/80 (!) 84/64 92/76  Pulse: (!) 44 (!) 59 80 67  Resp: 17 14 18 11   Temp:  97.4 F (36.3 C) 97.4 F (36.3 C) 97.6 F (36.4 C)  TempSrc:  Oral Oral Oral  SpO2: 98% 96% 95% 97%  Weight:      Height:        Intake/Output Summary (Last 24 hours) at 11/13/16 1554 Last data filed at 11/13/16 1500  Gross per 24 hour  Intake             1350 ml  Output               50 ml  Net              1300 ml    Exam:  Examination:  General exam: Appears calm and comfortable  Respiratory system: Clear to auscultation. Respiratory effort normal. Cardiovascular system: S1 & S2 heard,irre rr. No JVD, murmurs, rubs, gallops or clicks. No pedal edema. Gastrointestinal system: Abdomen is nondistended, soft and nontender. No organomegaly or masses felt. Normal bowel sounds heard. Central nervous system: Alert and oriented. No focal neurological deficits. Extremities: Symmetric 5 x 5 power. Skin: No rashes, lesions or ulcers Psychiatry: Judgement and insight appear normal. Mood & affect appropriate.     Data Reviewed: I have personally reviewed following labs and imaging studies  Micro Results Recent Results (from the past 240 hour(s))  MRSA PCR Screening     Status: None   Collection Time: 11/13/16  4:17 AM  Result Value Ref Range Status   MRSA by PCR NEGATIVE NEGATIVE Final    Comment:        The GeneXpert MRSA Assay (FDA approved for NASAL specimens only), is one component of a comprehensive MRSA colonization surveillance program. It is not intended to diagnose MRSA infection nor to guide or monitor treatment for MRSA infections.     Radiology Reports Ct Head Wo Contrast  Result Date: 11/12/2016 CLINICAL DATA:  76 year old male status post fall on blood thinners. Initial encounter. EXAM: CT HEAD WITHOUT CONTRAST CT CERVICAL SPINE WITHOUT CONTRAST TECHNIQUE: Multidetector CT imaging of the head and cervical spine was performed following the standard protocol without intravenous contrast. Multiplanar CT image reconstructions of the cervical spine were also generated. COMPARISON:  Head CT without contrast 08/20/2016. Head and cervical spine CT 08/12/2016. FINDINGS: CT HEAD FINDINGS Brain: Extensive right MCA territory encephalomalacia re - demonstrated and largely related to the September 2017 infarct. Superimposed chronic posterior left MCA territory encephalomalacia.  Stable cerebral volume. Stable gray-white matter differentiation throughout the brain. No acute intracranial hemorrhage identified. No midline shift, mass effect, or evidence of intracranial mass lesion. No ventriculomegaly. No new cortically based infarct identified. Vascular: Calcified atherosclerosis at the skull base. Skull: Intact.  No acute osseous abnormality identified. Sinuses/Orbits: Visualized paranasal sinuses and mastoids are stable and well pneumatized. Other: No acute orbit or scalp soft tissue finding. CT CERVICAL SPINE FINDINGS Alignment: Improved cervical lordosis compared to November. Cervicothoracic junction alignment is within normal limits. Bilateral posterior element alignment is within normal limits. Skull base and vertebrae: Visualized skull base is intact. No atlanto-occipital dissociation. No acute cervical spine fracture identified. Soft tissues and spinal canal: No prevertebral fluid or swelling. No visible canal hematoma. Negative noncontrast neck soft tissues. Disc levels: Mild degenerative cervical spinal stenosis suspected at C3-C4 in part related to broad-based disc (series 302, image 62). Upper chest: Visible upper thoracic  levels appear intact. Negative lung apices. Negative visualized superior noncontrast mediastinum. IMPRESSION: 1. No acute intracranial abnormality. Stable noncontrast CT appearance of the brain with extensive right worse than left chronic MCA territory ischemia. 2.  No acute fracture or listhesis identified in the cervical spine. 3. Mild degenerative cervical spinal stenosis suspected at C3-C4. Electronically Signed   By: Genevie Ann M.D.   On: 11/12/2016 17:11   Ct Cervical Spine Wo Contrast  Result Date: 11/12/2016 CLINICAL DATA:  76 year old male status post fall on blood thinners. Initial encounter. EXAM: CT HEAD WITHOUT CONTRAST CT CERVICAL SPINE WITHOUT CONTRAST TECHNIQUE: Multidetector CT imaging of the head and cervical spine was performed following the  standard protocol without intravenous contrast. Multiplanar CT image reconstructions of the cervical spine were also generated. COMPARISON:  Head CT without contrast 08/20/2016. Head and cervical spine CT 08/12/2016. FINDINGS: CT HEAD FINDINGS Brain: Extensive right MCA territory encephalomalacia re - demonstrated and largely related to the September 2017 infarct. Superimposed chronic posterior left MCA territory encephalomalacia. Stable cerebral volume. Stable gray-white matter differentiation throughout the brain. No acute intracranial hemorrhage identified. No midline shift, mass effect, or evidence of intracranial mass lesion. No ventriculomegaly. No new cortically based infarct identified. Vascular: Calcified atherosclerosis at the skull base. Skull: Intact.  No acute osseous abnormality identified. Sinuses/Orbits: Visualized paranasal sinuses and mastoids are stable and well pneumatized. Other: No acute orbit or scalp soft tissue finding. CT CERVICAL SPINE FINDINGS Alignment: Improved cervical lordosis compared to November. Cervicothoracic junction alignment is within normal limits. Bilateral posterior element alignment is within normal limits. Skull base and vertebrae: Visualized skull base is intact. No atlanto-occipital dissociation. No acute cervical spine fracture identified. Soft tissues and spinal canal: No prevertebral fluid or swelling. No visible canal hematoma. Negative noncontrast neck soft tissues. Disc levels: Mild degenerative cervical spinal stenosis suspected at C3-C4 in part related to broad-based disc (series 302, image 62). Upper chest: Visible upper thoracic levels appear intact. Negative lung apices. Negative visualized superior noncontrast mediastinum. IMPRESSION: 1. No acute intracranial abnormality. Stable noncontrast CT appearance of the brain with extensive right worse than left chronic MCA territory ischemia. 2.  No acute fracture or listhesis identified in the cervical spine. 3.  Mild degenerative cervical spinal stenosis suspected at C3-C4. Electronically Signed   By: Genevie Ann M.D.   On: 11/12/2016 17:11   Dg Pelvis Portable  Result Date: 11/13/2016 CLINICAL DATA:  Pain following fall EXAM: PORTABLE PELVIS 1-2 VIEWS COMPARISON:  None. FINDINGS: There is no evidence of pelvic fracture or dislocation. There is slight symmetric narrowing of both hip joints. No erosive change. IMPRESSION: Slight symmetric narrowing both hip joints. No fracture or dislocation. Electronically Signed   By: Lowella Grip III M.D.   On: 11/13/2016 08:58   Dg Chest Portable 1 View  Result Date: 11/12/2016 CLINICAL DATA:  76 year old male status post fall. Dizziness, atrial fibrillation with rapid ventricular response. Lethargy. Initial encounter. EXAM: PORTABLE CHEST 1 VIEW COMPARISON:  08/12/2016 and earlier. FINDINGS: Portable AP semi upright view at 1547 hours. Stable mild-to-moderate cardiomegaly. Stable mild tortuosity of the thoracic aorta. Calcified aortic atherosclerosis. Other mediastinal contours are within normal limits. Visualized tracheal air column is within normal limits. Allowing for portable technique the lungs are clear. No pneumothorax. Negative visible bowel gas pattern. Chronic left lateral fourth rib fracture. IMPRESSION: No acute cardiopulmonary abnormality. Stable cardiomegaly.  Calcified aortic atherosclerosis. Electronically Signed   By: Genevie Ann M.D.   On: 11/12/2016 15:56     CBC  Recent Labs Lab 11/12/16 1619 11/13/16 0331  WBC 3.1* 7.7  HGB 17.5* 15.5  HCT 48.1 43.0  PLT 79* 78*  MCV 93.2 92.7  MCH 33.9 33.4  MCHC 36.4* 36.0  RDW 18.4* 18.4*  LYMPHSABS 0.6*  --   MONOABS 0.3  --   EOSABS 0.0  --   BASOSABS 0.0  --     Chemistries   Recent Labs Lab 11/12/16 1619 11/12/16 2204 11/13/16 0331 11/13/16 0404  NA 140  --  135  --   K 3.4*  --  3.0*  --   CL 104  --  102  --   CO2 18*  --  17*  --   GLUCOSE 130*  --  137*  --   BUN 9  --  10  --    CREATININE 1.44*  --  1.55*  --   CALCIUM 8.7*  --  8.2*  --   MG  --  1.6*  --  1.7  AST 177*  --   --   --   ALT 53  --   --   --   ALKPHOS 96  --   --   --   BILITOT 2.6*  --   --   --    ------------------------------------------------------------------------------------------------------------------ estimated creatinine clearance is 40.5 mL/min (by C-G formula based on SCr of 1.55 mg/dL (H)). ------------------------------------------------------------------------------------------------------------------ No results for input(s): HGBA1C in the last 72 hours. ------------------------------------------------------------------------------------------------------------------ No results for input(s): CHOL, HDL, LDLCALC, TRIG, CHOLHDL, LDLDIRECT in the last 72 hours. ------------------------------------------------------------------------------------------------------------------  Recent Labs  11/12/16 1619  TSH 10.580*   ------------------------------------------------------------------------------------------------------------------ No results for input(s): VITAMINB12, FOLATE, FERRITIN, TIBC, IRON, RETICCTPCT in the last 72 hours.  Coagulation profile No results for input(s): INR, PROTIME in the last 168 hours.  No results for input(s): DDIMER in the last 72 hours.  Cardiac Enzymes  Recent Labs Lab 11/12/16 2204 11/13/16 0331 11/13/16 1010  TROPONINI 0.05* 0.05* 0.04*   ------------------------------------------------------------------------------------------------------------------ Invalid input(s): POCBNP   CBG: No results for input(s): GLUCAP in the last 168 hours.     Studies: Ct Head Wo Contrast  Result Date: 11/12/2016 CLINICAL DATA:  76 year old male status post fall on blood thinners. Initial encounter. EXAM: CT HEAD WITHOUT CONTRAST CT CERVICAL SPINE WITHOUT CONTRAST TECHNIQUE: Multidetector CT imaging of the head and cervical spine was performed  following the standard protocol without intravenous contrast. Multiplanar CT image reconstructions of the cervical spine were also generated. COMPARISON:  Head CT without contrast 08/20/2016. Head and cervical spine CT 08/12/2016. FINDINGS: CT HEAD FINDINGS Brain: Extensive right MCA territory encephalomalacia re - demonstrated and largely related to the September 2017 infarct. Superimposed chronic posterior left MCA territory encephalomalacia. Stable cerebral volume. Stable gray-white matter differentiation throughout the brain. No acute intracranial hemorrhage identified. No midline shift, mass effect, or evidence of intracranial mass lesion. No ventriculomegaly. No new cortically based infarct identified. Vascular: Calcified atherosclerosis at the skull base. Skull: Intact.  No acute osseous abnormality identified. Sinuses/Orbits: Visualized paranasal sinuses and mastoids are stable and well pneumatized. Other: No acute orbit or scalp soft tissue finding. CT CERVICAL SPINE FINDINGS Alignment: Improved cervical lordosis compared to November. Cervicothoracic junction alignment is within normal limits. Bilateral posterior element alignment is within normal limits. Skull base and vertebrae: Visualized skull base is intact. No atlanto-occipital dissociation. No acute cervical spine fracture identified. Soft tissues and spinal canal: No prevertebral fluid or swelling. No visible canal hematoma. Negative noncontrast neck soft tissues. Disc levels: Mild degenerative cervical  spinal stenosis suspected at C3-C4 in part related to broad-based disc (series 302, image 62). Upper chest: Visible upper thoracic levels appear intact. Negative lung apices. Negative visualized superior noncontrast mediastinum. IMPRESSION: 1. No acute intracranial abnormality. Stable noncontrast CT appearance of the brain with extensive right worse than left chronic MCA territory ischemia. 2.  No acute fracture or listhesis identified in the  cervical spine. 3. Mild degenerative cervical spinal stenosis suspected at C3-C4. Electronically Signed   By: Genevie Ann M.D.   On: 11/12/2016 17:11   Ct Cervical Spine Wo Contrast  Result Date: 11/12/2016 CLINICAL DATA:  76 year old male status post fall on blood thinners. Initial encounter. EXAM: CT HEAD WITHOUT CONTRAST CT CERVICAL SPINE WITHOUT CONTRAST TECHNIQUE: Multidetector CT imaging of the head and cervical spine was performed following the standard protocol without intravenous contrast. Multiplanar CT image reconstructions of the cervical spine were also generated. COMPARISON:  Head CT without contrast 08/20/2016. Head and cervical spine CT 08/12/2016. FINDINGS: CT HEAD FINDINGS Brain: Extensive right MCA territory encephalomalacia re - demonstrated and largely related to the September 2017 infarct. Superimposed chronic posterior left MCA territory encephalomalacia. Stable cerebral volume. Stable gray-white matter differentiation throughout the brain. No acute intracranial hemorrhage identified. No midline shift, mass effect, or evidence of intracranial mass lesion. No ventriculomegaly. No new cortically based infarct identified. Vascular: Calcified atherosclerosis at the skull base. Skull: Intact.  No acute osseous abnormality identified. Sinuses/Orbits: Visualized paranasal sinuses and mastoids are stable and well pneumatized. Other: No acute orbit or scalp soft tissue finding. CT CERVICAL SPINE FINDINGS Alignment: Improved cervical lordosis compared to November. Cervicothoracic junction alignment is within normal limits. Bilateral posterior element alignment is within normal limits. Skull base and vertebrae: Visualized skull base is intact. No atlanto-occipital dissociation. No acute cervical spine fracture identified. Soft tissues and spinal canal: No prevertebral fluid or swelling. No visible canal hematoma. Negative noncontrast neck soft tissues. Disc levels: Mild degenerative cervical spinal  stenosis suspected at C3-C4 in part related to broad-based disc (series 302, image 62). Upper chest: Visible upper thoracic levels appear intact. Negative lung apices. Negative visualized superior noncontrast mediastinum. IMPRESSION: 1. No acute intracranial abnormality. Stable noncontrast CT appearance of the brain with extensive right worse than left chronic MCA territory ischemia. 2.  No acute fracture or listhesis identified in the cervical spine. 3. Mild degenerative cervical spinal stenosis suspected at C3-C4. Electronically Signed   By: Genevie Ann M.D.   On: 11/12/2016 17:11   Dg Pelvis Portable  Result Date: 11/13/2016 CLINICAL DATA:  Pain following fall EXAM: PORTABLE PELVIS 1-2 VIEWS COMPARISON:  None. FINDINGS: There is no evidence of pelvic fracture or dislocation. There is slight symmetric narrowing of both hip joints. No erosive change. IMPRESSION: Slight symmetric narrowing both hip joints. No fracture or dislocation. Electronically Signed   By: Lowella Grip III M.D.   On: 11/13/2016 08:58   Dg Chest Portable 1 View  Result Date: 11/12/2016 CLINICAL DATA:  76 year old male status post fall. Dizziness, atrial fibrillation with rapid ventricular response. Lethargy. Initial encounter. EXAM: PORTABLE CHEST 1 VIEW COMPARISON:  08/12/2016 and earlier. FINDINGS: Portable AP semi upright view at 1547 hours. Stable mild-to-moderate cardiomegaly. Stable mild tortuosity of the thoracic aorta. Calcified aortic atherosclerosis. Other mediastinal contours are within normal limits. Visualized tracheal air column is within normal limits. Allowing for portable technique the lungs are clear. No pneumothorax. Negative visible bowel gas pattern. Chronic left lateral fourth rib fracture. IMPRESSION: No acute cardiopulmonary abnormality. Stable cardiomegaly.  Calcified aortic  atherosclerosis. Electronically Signed   By: Genevie Ann M.D.   On: 11/12/2016 15:56      Lab Results  Component Value Date   HGBA1C 4.5  (L) 06/26/2016   HGBA1C 4.6 (L) 04/19/2016   Lab Results  Component Value Date   LDLCALC 38 06/26/2016   CREATININE 1.55 (H) 11/13/2016       Scheduled Meds: . apixaban  5 mg Oral BID  . feeding supplement (ENSURE ENLIVE)  237 mL Oral BID BM  . folic acid  1 mg Oral Daily  . levothyroxine  50 mcg Oral QAC breakfast  . multivitamin with minerals  1 tablet Oral Daily  . potassium chloride  40 mEq Oral BID  . thiamine  100 mg Oral Daily   Or  . thiamine  100 mg Intravenous Daily   Continuous Infusions: . 0.9 % NaCl with KCl 20 mEq / L 75 mL/hr at 11/13/16 1500     LOS: 0 days    Time spent: >30 MINS    [facility]  [facility] Pager [phone]. If 7PM-7AM, please contact night-coverage at [email or web address], password [facility] 11/13/2016, 3:54 PM  LOS: 0 days

## 2016-11-14 DIAGNOSIS — N179 Acute kidney failure, unspecified: Secondary | ICD-10-CM

## 2016-11-14 DIAGNOSIS — I428 Other cardiomyopathies: Secondary | ICD-10-CM

## 2016-11-14 DIAGNOSIS — I248 Other forms of acute ischemic heart disease: Secondary | ICD-10-CM

## 2016-11-14 LAB — CBC
HEMATOCRIT: 44.4 % (ref 39.0–52.0)
HEMOGLOBIN: 15.8 g/dL (ref 13.0–17.0)
MCH: 33.7 pg (ref 26.0–34.0)
MCHC: 35.6 g/dL (ref 30.0–36.0)
MCV: 94.7 fL (ref 78.0–100.0)
Platelets: 71 10*3/uL — ABNORMAL LOW (ref 150–400)
RBC: 4.69 MIL/uL (ref 4.22–5.81)
RDW: 19.1 % — ABNORMAL HIGH (ref 11.5–15.5)
WBC: 3.7 10*3/uL — ABNORMAL LOW (ref 4.0–10.5)

## 2016-11-14 LAB — COMPREHENSIVE METABOLIC PANEL
ALBUMIN: 2 g/dL — AB (ref 3.5–5.0)
ALK PHOS: 82 U/L (ref 38–126)
ALT: 43 U/L (ref 17–63)
ANION GAP: 14 (ref 5–15)
AST: 136 U/L — ABNORMAL HIGH (ref 15–41)
BUN: 11 mg/dL (ref 6–20)
CALCIUM: 8.1 mg/dL — AB (ref 8.9–10.3)
CHLORIDE: 106 mmol/L (ref 101–111)
CO2: 16 mmol/L — ABNORMAL LOW (ref 22–32)
Creatinine, Ser: 1.09 mg/dL (ref 0.61–1.24)
GFR calc Af Amer: >60 mL/min (ref >60)
GFR calc non Af Amer: >60 mL/min (ref >60)
GLUCOSE: 102 mg/dL — AB (ref 65–99)
POTASSIUM: 4.6 mmol/L (ref 3.5–5.1)
SODIUM: 136 mmol/L (ref 135–145)
Total Bilirubin: 1.8 mg/dL — ABNORMAL HIGH (ref 0.3–1.2)
Total Protein: 6.2 g/dL — ABNORMAL LOW (ref 6.5–8.1)

## 2016-11-14 LAB — MAGNESIUM: Magnesium: 1.9 mg/dL (ref 1.7–2.4)

## 2016-11-14 MED ORDER — MIDODRINE HCL 5 MG PO TABS
10.0000 mg | ORAL_TABLET | Freq: Three times a day (TID) | ORAL | Status: DC
  Administered 2016-11-14 – 2016-11-15 (×5): 10 mg via ORAL
  Filled 2016-11-14 (×5): qty 2

## 2016-11-14 MED ORDER — METOPROLOL TARTRATE 12.5 MG HALF TABLET
12.5000 mg | ORAL_TABLET | Freq: Two times a day (BID) | ORAL | Status: DC
  Administered 2016-11-14 – 2016-11-15 (×3): 12.5 mg via ORAL
  Filled 2016-11-14 (×3): qty 1

## 2016-11-14 NOTE — Progress Notes (Signed)
Transferred to 2west room28 by bed, stable, report given to RN. Belongings with pt.

## 2016-11-14 NOTE — Progress Notes (Signed)
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 male with history of cirrhosis of the liver, hepatitis C, alcohol abuse, nonischemic cardiomyopathy, stroke with left-sided hemiparesis, hypothyroidism, medical noncompliance was brought to the ER after patient had a fall at his home. Patient states he was trying to walk to the bathroom when he tripped on a rug and fell on the bed. CT of the head and C-spine was unremarkable. Patient was started on Cardizem infusion after bolus for A. fib with RVR. Patient states he only takes lactulose and tramadol for his known history of cirrhosis but has not been taking any of his other medications for A. fib or hypothyroidism. Patient states he was not prescribed. Patient states his last alcoholic drink was 10 days ago.   Patient was started on Cardizem infusion after bolus for A. fib with RVR. EKG shows A. fib with RVR. Chest x-ray unremarkable. CT head and C-spine was unremarkable. Labs also revealed metabolic acidosis with acute renal failure.  Assessment/Plan 1. A. fib with RVR - probably secondary to noncompliance with medications. Patient has not been taking his Apixaban or metoprolol. Patient  was on Cardizem infusion held due to low BP .  Patient is   on Apixaban  . Chads 2 vasc score is 6. Closely observe for any GI bleed given  history.Cardiology to consult .repeat echo shows EF 40-45%. Cardiology restarted metoprolol at a lower dose. Both potassium and magnesium have been supplemented. Will also start patient back on midodrine.No cardioversion needed. Tolerating atrial fibrillation/flutter. Doubt that he would stay in sinus rhythm 2. Acute renal failure with metabolic acidosis - not sure of the cause. Patient has had previous history of diarrhea but denies having any at this time. If patient's lactate level elevated will give fluid bolus. BUN/creatinine significantly improved, creatinine 1.09 3. Hypothyroidism with elevated TSH ,  free T4 1.2- probably noncompliant with Synthroid. Continue Synthroid at 50 g by mouth daily home dose previously prescribed. 4. Leukopenia and thrombocytopenia probably from alcoholism and cirrhosis - follow platelets closely. 5. History of stroke with left-sided hemiparesis - will restart Apixaban. 6. History of cirrhosis of the liver with hepatitis C and alcoholism and hepatocellular carcinoma - patient is on lactulose. 7. History of DVT - will restart 8. History of nonischemic cardiomyopathy last EF measured in September 2017 was 40-45% - appears dry at this time. 9. History of alcoholism - patient states his last alcoholic drink was 10 days ago. Closely observe. I have placed patient on CIWA. UDS negative    DVT prophylaxsis eliquis  Code Status:  Full code      Code Status Orders      Family Communication: Discussed in detail with the patient, all imaging results, lab results explained to the patient   Disposition Plan:  Transfer to telemetry, PT OT  evaluation, anticipate discharge in 2-3 days      Consultants: Cardiology  Procedures:  None   Antibiotics: Anti-infectives    None         HPI/Subjective:  chest pain, no shortness of breath. Ongoing complaints of knee pain, not new for him - stated yesterday  Objective: Vitals:   11/13/16 1934 11/13/16 2350  11/14/16 0336 11/14/16 0710  BP: 107/78 103/81 96/73 (!) 117/91  Pulse: (!) 56 90 63 (!) 101  Resp: 14 15 13 18   Temp: 98.2 F (36.8 C) 97.6 F (36.4 C) 97.7 F (36.5 C) 97.4 F (36.3 C)  TempSrc: Axillary Oral Oral Oral  SpO2: 96% 95% 98% 98%  Weight:   85 kg (187 lb 6.3 oz)   Height:        Intake/Output Summary (Last 24 hours) at 11/14/16 0809 Last data filed at 11/14/16 0300  Gross per 24 hour  Intake             1740 ml  Output              300 ml  Net             1440 ml    Exam:  Examination:  General exam: Appears calm and comfortable  Respiratory system: Clear to auscultation. Respiratory effort normal. Cardiovascular system: S1 & S2 heard,irre rr. No JVD, murmurs, rubs, gallops or clicks. No pedal edema. Gastrointestinal system: Abdomen is nondistended, soft and nontender. No organomegaly or masses felt. Normal bowel sounds heard. Central nervous system: Alert and oriented. No focal neurological deficits. Extremities: Symmetric 5 x 5 power. Skin: No rashes, lesions or ulcers Psychiatry: Judgement and insight appear normal. Mood & affect appropriate.     Data Reviewed: I have personally reviewed following labs and imaging studies  Micro Results Recent Results (from the past 240 hour(s))  MRSA PCR Screening     Status: None   Collection Time: 11/13/16  4:17 AM  Result Value Ref Range Status   MRSA by PCR NEGATIVE NEGATIVE Final    Comment:        The GeneXpert MRSA Assay (FDA approved for NASAL specimens only), is one component of a comprehensive MRSA colonization surveillance program. It is not intended to diagnose MRSA infection nor to guide or monitor treatment for MRSA infections.     Radiology Reports Ct Head Wo Contrast  Result Date: 11/12/2016 CLINICAL DATA:  76 year old male status post fall on blood thinners. Initial encounter. EXAM: CT HEAD WITHOUT CONTRAST CT CERVICAL SPINE WITHOUT CONTRAST TECHNIQUE: Multidetector CT imaging of the head  and cervical spine was performed following the standard protocol without intravenous contrast. Multiplanar CT image reconstructions of the cervical spine were also generated. COMPARISON:  Head CT without contrast 08/20/2016. Head and cervical spine CT 08/12/2016. FINDINGS: CT HEAD FINDINGS Brain: Extensive right MCA territory encephalomalacia re - demonstrated and largely related to the September 2017 infarct. Superimposed chronic posterior left MCA territory encephalomalacia. Stable cerebral volume. Stable gray-white matter differentiation throughout the brain. No acute intracranial hemorrhage identified. No midline shift, mass effect, or evidence of intracranial mass lesion. No ventriculomegaly. No new cortically based infarct identified. Vascular: Calcified atherosclerosis at the skull base. Skull: Intact.  No acute osseous abnormality identified. Sinuses/Orbits: Visualized paranasal sinuses and mastoids are stable and well pneumatized. Other: No acute orbit or scalp soft tissue finding. CT CERVICAL SPINE FINDINGS Alignment: Improved cervical lordosis compared to November. Cervicothoracic junction alignment is within normal limits. Bilateral posterior element alignment is  within normal limits. Skull base and vertebrae: Visualized skull base is intact. No atlanto-occipital dissociation. No acute cervical spine fracture identified. Soft tissues and spinal canal: No prevertebral fluid or swelling. No visible canal hematoma. Negative noncontrast neck soft tissues. Disc levels: Mild degenerative cervical spinal stenosis suspected at C3-C4 in part related to broad-based disc (series 302, image 62). Upper chest: Visible upper thoracic levels appear intact. Negative lung apices. Negative visualized superior noncontrast mediastinum. IMPRESSION: 1. No acute intracranial abnormality. Stable noncontrast CT appearance of the brain with extensive right worse than left chronic MCA territory ischemia. 2.  No acute fracture or  listhesis identified in the cervical spine. 3. Mild degenerative cervical spinal stenosis suspected at C3-C4. Electronically Signed   By: Genevie Ann M.D.   On: 11/12/2016 17:11   Ct Cervical Spine Wo Contrast  Result Date: 11/12/2016 CLINICAL DATA:  77 year old male status post fall on blood thinners. Initial encounter. EXAM: CT HEAD WITHOUT CONTRAST CT CERVICAL SPINE WITHOUT CONTRAST TECHNIQUE: Multidetector CT imaging of the head and cervical spine was performed following the standard protocol without intravenous contrast. Multiplanar CT image reconstructions of the cervical spine were also generated. COMPARISON:  Head CT without contrast 08/20/2016. Head and cervical spine CT 08/12/2016. FINDINGS: CT HEAD FINDINGS Brain: Extensive right MCA territory encephalomalacia re - demonstrated and largely related to the September 2017 infarct. Superimposed chronic posterior left MCA territory encephalomalacia. Stable cerebral volume. Stable gray-white matter differentiation throughout the brain. No acute intracranial hemorrhage identified. No midline shift, mass effect, or evidence of intracranial mass lesion. No ventriculomegaly. No new cortically based infarct identified. Vascular: Calcified atherosclerosis at the skull base. Skull: Intact.  No acute osseous abnormality identified. Sinuses/Orbits: Visualized paranasal sinuses and mastoids are stable and well pneumatized. Other: No acute orbit or scalp soft tissue finding. CT CERVICAL SPINE FINDINGS Alignment: Improved cervical lordosis compared to November. Cervicothoracic junction alignment is within normal limits. Bilateral posterior element alignment is within normal limits. Skull base and vertebrae: Visualized skull base is intact. No atlanto-occipital dissociation. No acute cervical spine fracture identified. Soft tissues and spinal canal: No prevertebral fluid or swelling. No visible canal hematoma. Negative noncontrast neck soft tissues. Disc levels: Mild  degenerative cervical spinal stenosis suspected at C3-C4 in part related to broad-based disc (series 302, image 62). Upper chest: Visible upper thoracic levels appear intact. Negative lung apices. Negative visualized superior noncontrast mediastinum. IMPRESSION: 1. No acute intracranial abnormality. Stable noncontrast CT appearance of the brain with extensive right worse than left chronic MCA territory ischemia. 2.  No acute fracture or listhesis identified in the cervical spine. 3. Mild degenerative cervical spinal stenosis suspected at C3-C4. Electronically Signed   By: Genevie Ann M.D.   On: 11/12/2016 17:11   Dg Pelvis Portable  Result Date: 11/13/2016 CLINICAL DATA:  Pain following fall EXAM: PORTABLE PELVIS 1-2 VIEWS COMPARISON:  None. FINDINGS: There is no evidence of pelvic fracture or dislocation. There is slight symmetric narrowing of both hip joints. No erosive change. IMPRESSION: Slight symmetric narrowing both hip joints. No fracture or dislocation. Electronically Signed   By: Lowella Grip III M.D.   On: 11/13/2016 08:58   Dg Chest Portable 1 View  Result Date: 11/12/2016 CLINICAL DATA:  76 year old male status post fall. Dizziness, atrial fibrillation with rapid ventricular response. Lethargy. Initial encounter. EXAM: PORTABLE CHEST 1 VIEW COMPARISON:  08/12/2016 and earlier. FINDINGS: Portable AP semi upright view at 1547 hours. Stable mild-to-moderate cardiomegaly. Stable mild tortuosity of the thoracic aorta. Calcified aortic atherosclerosis. Other  mediastinal contours are within normal limits. Visualized tracheal air column is within normal limits. Allowing for portable technique the lungs are clear. No pneumothorax. Negative visible bowel gas pattern. Chronic left lateral fourth rib fracture. IMPRESSION: No acute cardiopulmonary abnormality. Stable cardiomegaly.  Calcified aortic atherosclerosis. Electronically Signed   By: Genevie Ann M.D.   On: 11/12/2016 15:56     CBC  Recent Labs Lab  11/12/16 1619 11/13/16 0331 11/14/16 0333  WBC 3.1* 7.7 3.7*  HGB 17.5* 15.5 15.8  HCT 48.1 43.0 44.4  PLT 79* 78* 71*  MCV 93.2 92.7 94.7  MCH 33.9 33.4 33.7  MCHC 36.4* 36.0 35.6  RDW 18.4* 18.4* 19.1*  LYMPHSABS 0.6*  --   --   MONOABS 0.3  --   --   EOSABS 0.0  --   --   BASOSABS 0.0  --   --     Chemistries   Recent Labs Lab 11/12/16 1619 11/12/16 2204 11/13/16 0331 11/13/16 0404 11/14/16 0333  NA 140  --  135  --  136  K 3.4*  --  3.0*  --  4.6  CL 104  --  102  --  106  CO2 18*  --  17*  --  16*  GLUCOSE 130*  --  137*  --  102*  BUN 9  --  10  --  11  CREATININE 1.44*  --  1.55*  --  1.09  CALCIUM 8.7*  --  8.2*  --  8.1*  MG  --  1.6*  --  1.7 1.9  AST 177*  --   --   --  136*  ALT 53  --   --   --  43  ALKPHOS 96  --   --   --  82  BILITOT 2.6*  --   --   --  1.8*   ------------------------------------------------------------------------------------------------------------------ estimated creatinine clearance is 62.3 mL/min (by C-G formula based on SCr of 1.09 mg/dL). ------------------------------------------------------------------------------------------------------------------ No results for input(s): HGBA1C in the last 72 hours. ------------------------------------------------------------------------------------------------------------------ No results for input(s): CHOL, HDL, LDLCALC, TRIG, CHOLHDL, LDLDIRECT in the last 72 hours. ------------------------------------------------------------------------------------------------------------------  Recent Labs  11/12/16 1619  TSH 10.580*   ------------------------------------------------------------------------------------------------------------------ No results for input(s): VITAMINB12, FOLATE, FERRITIN, TIBC, IRON, RETICCTPCT in the last 72 hours.  Coagulation profile No results for input(s): INR, PROTIME in the last 168 hours.  No results for input(s): DDIMER in the last 72 hours.  Cardiac  Enzymes  Recent Labs Lab 11/12/16 2204 11/13/16 0331 11/13/16 1010  TROPONINI 0.05* 0.05* 0.04*   ------------------------------------------------------------------------------------------------------------------ Invalid input(s): POCBNP   CBG: No results for input(s): GLUCAP in the last 168 hours.     Studies: Ct Head Wo Contrast  Result Date: 11/12/2016 CLINICAL DATA:  76 year old male status post fall on blood thinners. Initial encounter. EXAM: CT HEAD WITHOUT CONTRAST CT CERVICAL SPINE WITHOUT CONTRAST TECHNIQUE: Multidetector CT imaging of the head and cervical spine was performed following the standard protocol without intravenous contrast. Multiplanar CT image reconstructions of the cervical spine were also generated. COMPARISON:  Head CT without contrast 08/20/2016. Head and cervical spine CT 08/12/2016. FINDINGS: CT HEAD FINDINGS Brain: Extensive right MCA territory encephalomalacia re - demonstrated and largely related to the September 2017 infarct. Superimposed chronic posterior left MCA territory encephalomalacia. Stable cerebral volume. Stable gray-white matter differentiation throughout the brain. No acute intracranial hemorrhage identified. No midline shift, mass effect, or evidence of intracranial mass lesion. No ventriculomegaly. No new cortically based infarct identified. Vascular:  Calcified atherosclerosis at the skull base. Skull: Intact.  No acute osseous abnormality identified. Sinuses/Orbits: Visualized paranasal sinuses and mastoids are stable and well pneumatized. Other: No acute orbit or scalp soft tissue finding. CT CERVICAL SPINE FINDINGS Alignment: Improved cervical lordosis compared to November. Cervicothoracic junction alignment is within normal limits. Bilateral posterior element alignment is within normal limits. Skull base and vertebrae: Visualized skull base is intact. No atlanto-occipital dissociation. No acute cervical spine fracture identified. Soft tissues  and spinal canal: No prevertebral fluid or swelling. No visible canal hematoma. Negative noncontrast neck soft tissues. Disc levels: Mild degenerative cervical spinal stenosis suspected at C3-C4 in part related to broad-based disc (series 302, image 62). Upper chest: Visible upper thoracic levels appear intact. Negative lung apices. Negative visualized superior noncontrast mediastinum. IMPRESSION: 1. No acute intracranial abnormality. Stable noncontrast CT appearance of the brain with extensive right worse than left chronic MCA territory ischemia. 2.  No acute fracture or listhesis identified in the cervical spine. 3. Mild degenerative cervical spinal stenosis suspected at C3-C4. Electronically Signed   By: Genevie Ann M.D.   On: 11/12/2016 17:11   Ct Cervical Spine Wo Contrast  Result Date: 11/12/2016 CLINICAL DATA:  76 year old male status post fall on blood thinners. Initial encounter. EXAM: CT HEAD WITHOUT CONTRAST CT CERVICAL SPINE WITHOUT CONTRAST TECHNIQUE: Multidetector CT imaging of the head and cervical spine was performed following the standard protocol without intravenous contrast. Multiplanar CT image reconstructions of the cervical spine were also generated. COMPARISON:  Head CT without contrast 08/20/2016. Head and cervical spine CT 08/12/2016. FINDINGS: CT HEAD FINDINGS Brain: Extensive right MCA territory encephalomalacia re - demonstrated and largely related to the September 2017 infarct. Superimposed chronic posterior left MCA territory encephalomalacia. Stable cerebral volume. Stable gray-white matter differentiation throughout the brain. No acute intracranial hemorrhage identified. No midline shift, mass effect, or evidence of intracranial mass lesion. No ventriculomegaly. No new cortically based infarct identified. Vascular: Calcified atherosclerosis at the skull base. Skull: Intact.  No acute osseous abnormality identified. Sinuses/Orbits: Visualized paranasal sinuses and mastoids are stable  and well pneumatized. Other: No acute orbit or scalp soft tissue finding. CT CERVICAL SPINE FINDINGS Alignment: Improved cervical lordosis compared to November. Cervicothoracic junction alignment is within normal limits. Bilateral posterior element alignment is within normal limits. Skull base and vertebrae: Visualized skull base is intact. No atlanto-occipital dissociation. No acute cervical spine fracture identified. Soft tissues and spinal canal: No prevertebral fluid or swelling. No visible canal hematoma. Negative noncontrast neck soft tissues. Disc levels: Mild degenerative cervical spinal stenosis suspected at C3-C4 in part related to broad-based disc (series 302, image 62). Upper chest: Visible upper thoracic levels appear intact. Negative lung apices. Negative visualized superior noncontrast mediastinum. IMPRESSION: 1. No acute intracranial abnormality. Stable noncontrast CT appearance of the brain with extensive right worse than left chronic MCA territory ischemia. 2.  No acute fracture or listhesis identified in the cervical spine. 3. Mild degenerative cervical spinal stenosis suspected at C3-C4. Electronically Signed   By: Genevie Ann M.D.   On: 11/12/2016 17:11   Dg Pelvis Portable  Result Date: 11/13/2016 CLINICAL DATA:  Pain following fall EXAM: PORTABLE PELVIS 1-2 VIEWS COMPARISON:  None. FINDINGS: There is no evidence of pelvic fracture or dislocation. There is slight symmetric narrowing of both hip joints. No erosive change. IMPRESSION: Slight symmetric narrowing both hip joints. No fracture or dislocation. Electronically Signed   By: Lowella Grip III M.D.   On: 11/13/2016 08:58   Dg Chest Portable  1 View  Result Date: 11/12/2016 CLINICAL DATA:  76 year old male status post fall. Dizziness, atrial fibrillation with rapid ventricular response. Lethargy. Initial encounter. EXAM: PORTABLE CHEST 1 VIEW COMPARISON:  08/12/2016 and earlier. FINDINGS: Portable AP semi upright view at 1547 hours.  Stable mild-to-moderate cardiomegaly. Stable mild tortuosity of the thoracic aorta. Calcified aortic atherosclerosis. Other mediastinal contours are within normal limits. Visualized tracheal air column is within normal limits. Allowing for portable technique the lungs are clear. No pneumothorax. Negative visible bowel gas pattern. Chronic left lateral fourth rib fracture. IMPRESSION: No acute cardiopulmonary abnormality. Stable cardiomegaly.  Calcified aortic atherosclerosis. Electronically Signed   By: Genevie Ann M.D.   On: 11/12/2016 15:56      Lab Results  Component Value Date   HGBA1C 4.5 (L) 06/26/2016   HGBA1C 4.6 (L) 04/19/2016   Lab Results  Component Value Date   LDLCALC 38 06/26/2016   CREATININE 1.09 11/14/2016       Scheduled Meds: . apixaban  5 mg Oral BID  . feeding supplement (ENSURE ENLIVE)  237 mL Oral BID BM  . folic acid  1 mg Oral Daily  . levothyroxine  50 mcg Oral QAC breakfast  . multivitamin with minerals  1 tablet Oral Daily  . thiamine  100 mg Oral Daily   Or  . thiamine  100 mg Intravenous Daily   Continuous Infusions: . 0.9 % NaCl with KCl 20 mEq / L 75 mL/hr at 11/13/16 2347     LOS: 1 day    Time spent: >30 MINS    [facility] Pager [phone]. If 7PM-7AM, please contact night-coverage at [email or web address], password [facility] 11/14/2016, 8:09 AM  LOS: 1 day

## 2016-11-14 NOTE — Progress Notes (Signed)
Progress Note  Patient Name: Nathan Frank Date of Encounter: 11/14/2016  Primary Cardiologist: Dr. Harrington Challenger  Subjective   No chest pain, no shortness of breath. Ongoing complaints of knee pain, not new for him - stated yesterday.  Currently resting comfortably. HR 80  Inpatient Medications    Scheduled Meds: . apixaban  5 mg Oral BID  . feeding supplement (ENSURE ENLIVE)  237 mL Oral BID BM  . folic acid  1 mg Oral Daily  . levothyroxine  50 mcg Oral QAC breakfast  . metoprolol tartrate  12.5 mg Oral BID  . midodrine  10 mg Oral TID WC  . multivitamin with minerals  1 tablet Oral Daily  . thiamine  100 mg Oral Daily   Or  . thiamine  100 mg Intravenous Daily   Continuous Infusions: . 0.9 % NaCl with KCl 20 mEq / L 75 mL/hr at 11/13/16 2347   PRN Meds: LORazepam **OR** LORazepam, ondansetron **OR** ondansetron (ZOFRAN) IV, traMADol   Vital Signs    Vitals:   11/13/16 1934 11/13/16 2350 11/14/16 0336 11/14/16 0710  BP: 107/78 103/81 96/73 (!) 117/91  Pulse: (!) 56 90 63 (!) 101  Resp: 14 15 13 18   Temp: 98.2 F (36.8 C) 97.6 F (36.4 C) 97.7 F (36.5 C) 97.4 F (36.3 C)  TempSrc: Axillary Oral Oral Oral  SpO2: 96% 95% 98% 98%  Weight:   187 lb 6.3 oz (85 kg)   Height:        Intake/Output Summary (Last 24 hours) at 11/14/16 1048 Last data filed at 11/14/16 1000  Gross per 24 hour  Intake             2265 ml  Output              300 ml  Net             1965 ml   Filed Weights   11/13/16 0400 11/14/16 0336  Weight: 179 lb 10.8 oz (81.5 kg) 187 lb 6.3 oz (85 kg)    Telemetry    Atrial fibrillation, rate controlled - Personally Reviewed  ECG    Atrial fibrillation - Personally Reviewed  Physical Exam   GEN: No acute distress.   Neck: No JVD Cardiac: IRIR, no murmurs, rubs, or gallops.  Respiratory: Clear to auscultation bilaterally. GI: Soft, nontender, non-distended  MS: No edema; No deformity.Trace lower extremity edema Neuro:  Nonfocal    Psych: Normal affect   Labs    Chemistry Recent Labs Lab 11/12/16 1619 11/13/16 0331 11/14/16 0333  NA 140 135 136  K 3.4* 3.0* 4.6  CL 104 102 106  CO2 18* 17* 16*  GLUCOSE 130* 137* 102*  BUN 9 10 11   CREATININE 1.44* 1.55* 1.09  CALCIUM 8.7* 8.2* 8.1*  PROT 7.2  --  6.2*  ALBUMIN 2.5*  --  2.0*  AST 177*  --  136*  ALT 53  --  43  ALKPHOS 96  --  82  BILITOT 2.6*  --  1.8*  GFRNONAA 46* 42* >60  GFRAA 53* 48* >60  ANIONGAP 18* 16* 14     Hematology Recent Labs Lab 11/12/16 1619 11/13/16 0331 11/14/16 0333  WBC 3.1* 7.7 3.7*  RBC 5.16 4.64 4.69  HGB 17.5* 15.5 15.8  HCT 48.1 43.0 44.4  MCV 93.2 92.7 94.7  MCH 33.9 33.4 33.7  MCHC 36.4* 36.0 35.6  RDW 18.4* 18.4* 19.1*  PLT 79* 78* 71*  Cardiac Enzymes Recent Labs Lab 11/12/16 2204 11/13/16 0331 11/13/16 1010  TROPONINI 0.05* 0.05* 0.04*    Recent Labs Lab 11/12/16 1518 11/12/16 2153  TROPIPOC 0.03 0.04     BNPNo results for input(s): BNP, PROBNP in the last 168 hours.   DDimer No results for input(s): DDIMER in the last 168 hours.   Radiology    Ct Head Wo Contrast  Result Date: 11/12/2016 CLINICAL DATA:  76 year old male status post fall on blood thinners. Initial encounter. EXAM: CT HEAD WITHOUT CONTRAST CT CERVICAL SPINE WITHOUT CONTRAST TECHNIQUE: Multidetector CT imaging of the head and cervical spine was performed following the standard protocol without intravenous contrast. Multiplanar CT image reconstructions of the cervical spine were also generated. COMPARISON:  Head CT without contrast 08/20/2016. Head and cervical spine CT 08/12/2016. FINDINGS: CT HEAD FINDINGS Brain: Extensive right MCA territory encephalomalacia re - demonstrated and largely related to the September 2017 infarct. Superimposed chronic posterior left MCA territory encephalomalacia. Stable cerebral volume. Stable gray-white matter differentiation throughout the brain. No acute intracranial hemorrhage identified. No  midline shift, mass effect, or evidence of intracranial mass lesion. No ventriculomegaly. No new cortically based infarct identified. Vascular: Calcified atherosclerosis at the skull base. Skull: Intact.  No acute osseous abnormality identified. Sinuses/Orbits: Visualized paranasal sinuses and mastoids are stable and well pneumatized. Other: No acute orbit or scalp soft tissue finding. CT CERVICAL SPINE FINDINGS Alignment: Improved cervical lordosis compared to November. Cervicothoracic junction alignment is within normal limits. Bilateral posterior element alignment is within normal limits. Skull base and vertebrae: Visualized skull base is intact. No atlanto-occipital dissociation. No acute cervical spine fracture identified. Soft tissues and spinal canal: No prevertebral fluid or swelling. No visible canal hematoma. Negative noncontrast neck soft tissues. Disc levels: Mild degenerative cervical spinal stenosis suspected at C3-C4 in part related to broad-based disc (series 302, image 62). Upper chest: Visible upper thoracic levels appear intact. Negative lung apices. Negative visualized superior noncontrast mediastinum. IMPRESSION: 1. No acute intracranial abnormality. Stable noncontrast CT appearance of the brain with extensive right worse than left chronic MCA territory ischemia. 2.  No acute fracture or listhesis identified in the cervical spine. 3. Mild degenerative cervical spinal stenosis suspected at C3-C4. Electronically Signed   By: Genevie Ann M.D.   On: 11/12/2016 17:11   Ct Cervical Spine Wo Contrast  Result Date: 11/12/2016 CLINICAL DATA:  76 year old male status post fall on blood thinners. Initial encounter. EXAM: CT HEAD WITHOUT CONTRAST CT CERVICAL SPINE WITHOUT CONTRAST TECHNIQUE: Multidetector CT imaging of the head and cervical spine was performed following the standard protocol without intravenous contrast. Multiplanar CT image reconstructions of the cervical spine were also generated.  COMPARISON:  Head CT without contrast 08/20/2016. Head and cervical spine CT 08/12/2016. FINDINGS: CT HEAD FINDINGS Brain: Extensive right MCA territory encephalomalacia re - demonstrated and largely related to the September 2017 infarct. Superimposed chronic posterior left MCA territory encephalomalacia. Stable cerebral volume. Stable gray-white matter differentiation throughout the brain. No acute intracranial hemorrhage identified. No midline shift, mass effect, or evidence of intracranial mass lesion. No ventriculomegaly. No new cortically based infarct identified. Vascular: Calcified atherosclerosis at the skull base. Skull: Intact.  No acute osseous abnormality identified. Sinuses/Orbits: Visualized paranasal sinuses and mastoids are stable and well pneumatized. Other: No acute orbit or scalp soft tissue finding. CT CERVICAL SPINE FINDINGS Alignment: Improved cervical lordosis compared to November. Cervicothoracic junction alignment is within normal limits. Bilateral posterior element alignment is within normal limits. Skull base and vertebrae: Visualized skull  base is intact. No atlanto-occipital dissociation. No acute cervical spine fracture identified. Soft tissues and spinal canal: No prevertebral fluid or swelling. No visible canal hematoma. Negative noncontrast neck soft tissues. Disc levels: Mild degenerative cervical spinal stenosis suspected at C3-C4 in part related to broad-based disc (series 302, image 62). Upper chest: Visible upper thoracic levels appear intact. Negative lung apices. Negative visualized superior noncontrast mediastinum. IMPRESSION: 1. No acute intracranial abnormality. Stable noncontrast CT appearance of the brain with extensive right worse than left chronic MCA territory ischemia. 2.  No acute fracture or listhesis identified in the cervical spine. 3. Mild degenerative cervical spinal stenosis suspected at C3-C4. Electronically Signed   By: Genevie Ann M.D.   On: 11/12/2016 17:11    Dg Pelvis Portable  Result Date: 11/13/2016 CLINICAL DATA:  Pain following fall EXAM: PORTABLE PELVIS 1-2 VIEWS COMPARISON:  None. FINDINGS: There is no evidence of pelvic fracture or dislocation. There is slight symmetric narrowing of both hip joints. No erosive change. IMPRESSION: Slight symmetric narrowing both hip joints. No fracture or dislocation. Electronically Signed   By: Lowella Grip III M.D.   On: 11/13/2016 08:58   Dg Chest Portable 1 View  Result Date: 11/12/2016 CLINICAL DATA:  76 year old male status post fall. Dizziness, atrial fibrillation with rapid ventricular response. Lethargy. Initial encounter. EXAM: PORTABLE CHEST 1 VIEW COMPARISON:  08/12/2016 and earlier. FINDINGS: Portable AP semi upright view at 1547 hours. Stable mild-to-moderate cardiomegaly. Stable mild tortuosity of the thoracic aorta. Calcified aortic atherosclerosis. Other mediastinal contours are within normal limits. Visualized tracheal air column is within normal limits. Allowing for portable technique the lungs are clear. No pneumothorax. Negative visible bowel gas pattern. Chronic left lateral fourth rib fracture. IMPRESSION: No acute cardiopulmonary abnormality. Stable cardiomegaly.  Calcified aortic atherosclerosis. Electronically Signed   By: Genevie Ann M.D.   On: 11/12/2016 15:56    Cardiac Studies   Echocardiogram 11/13/16 - Left ventricle: The cavity size was normal. Systolic function was   mildly to moderately reduced. The estimated ejection fraction was   in the range of 40% to 45%. Wall motion was normal; there were no   regional wall motion abnormalities. - Mitral valve: There was mild regurgitation. - Left atrium: The atrium was mildly dilated. - Tricuspid valve: There was moderate-severe regurgitation.  Impressions:  - This is a very poor quality study, additional images with   echocontrast are recommended.   LVEF 40-45%, there is basal and mid inferolateral and   anteroseptal  hypokinesis.   RV poorly visualized, RVEF can&'t be evaluated.  Echocardiogram 06/16/16 - Left ventricle: Posterior , septal and apical hypokinesis The   cavity size was mildly dilated. Wall thickness was normal.   Systolic function was mildly to moderately reduced. The estimated   ejection fraction was in the range of 40% to 45%. Left   ventricular diastolic function parameters were normal. - Mitral valve: There was mild regurgitation. - Atrial septum: No defect or patent foramen ovale was identified.  Cardiac catheterization 04/15/2015  Coronary arteries are widely patent. The right coronary, LAD, ramus, and circumflex are very tortuous.  Low normal to mildly depressed LV systolic function with an estimated EF of 45-50%. No regional wall motion abnormalities are noted.  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  Elevated myocardial markers for injury raises the possibility of coronary spasm, embolus, stress cardiomyopathy (atypical), and aborted infarction related to plaque rupture with thrombosis and dissolution.  Patient  Profile     76 y.o. male with long-standing nonischemic cardiomyopathy ejection fraction 40-45% with persistent atrial fibrillation on L request, cirrhosis, hep C, history of GI bleed, DVT, alcohol, heroin use, history of stroke with mild long-standing left-sided weakness here after fall at home found to be in atrial fibrillation with rapid ventricular response, now better rate control, noncompliance at home.  Assessment & Plan    Atrial fibrillation long-standing persistent  - Restarted metoprolol 12.5 mg 2 times a day  - Heart rate on average less than 110.  - No further intervention needed. No cardioversion needed. Tolerating atrial fibrillation/flutter. Doubt that he would stay in sinus rhythm.  Chronic anticoagulation  - Agree with Eliquis use  - Watch for any signs of  bleeding especially given his prior history  Chronic hypotension  - He is back on midodrine as he was taking at home.  Nonischemic cardiomyopathy/Demand ischemia  - Cardiac catheterization in 2016 showed normal coronary arteries  - ST depression on ECG and mildly elevated troponin indicative of demand ischemia in the setting of A. fib RVR and his recent fall.  - Mildly elevated troponin does increase his overall cardiovascular mortality risk.  - Appears stable from this aspect. EF seems stable 40-45%.  Acute kidney injury  - Creatinine 1.5 on admit, baseline 0.9  Hepatitis C liver cirrhosis  - Per primary team  Thrombocytopenia  - Watch for signs of bleeding  History of stroke  No further cardiac recommendations at this time. We will sign off.   Signed, Candee Furbish, MD  11/14/2016, 10:48 AM

## 2016-11-15 ENCOUNTER — Inpatient Hospital Stay (HOSPITAL_COMMUNITY): Payer: Medicare Other

## 2016-11-15 LAB — BASIC METABOLIC PANEL
ANION GAP: 7 (ref 5–15)
BUN: 12 mg/dL (ref 6–20)
CO2: 20 mmol/L — ABNORMAL LOW (ref 22–32)
Calcium: 8.3 mg/dL — ABNORMAL LOW (ref 8.9–10.3)
Chloride: 109 mmol/L (ref 101–111)
Creatinine, Ser: 0.93 mg/dL (ref 0.61–1.24)
GFR calc Af Amer: >60 mL/min (ref >60)
GLUCOSE: 79 mg/dL (ref 65–99)
POTASSIUM: 4.3 mmol/L (ref 3.5–5.1)
Sodium: 136 mmol/L (ref 135–145)

## 2016-11-15 MED ORDER — DEXTROSE 5 % IV SOLN
1.0000 g | INTRAVENOUS | Status: DC
  Administered 2016-11-15: 1 g via INTRAVENOUS
  Filled 2016-11-15: qty 10

## 2016-11-15 MED ORDER — METOPROLOL TARTRATE 25 MG PO TABS: 12.5000 mg | ORAL_TABLET | Freq: Two times a day (BID) | ORAL | 2 refills | Status: DC

## 2016-11-15 MED ORDER — CEPHALEXIN 500 MG PO CAPS: 500.0000 mg | ORAL_CAPSULE | Freq: Three times a day (TID) | ORAL | 0 refills | Status: AC

## 2016-11-15 MED ORDER — MIDODRINE HCL 10 MG PO TABS
10.0000 mg | ORAL_TABLET | Freq: Three times a day (TID) | ORAL | 2 refills | Status: AC
Start: 2016-11-15 — End: 2016-12-15

## 2016-11-15 MED ORDER — THIAMINE HCL 100 MG PO TABS: 100.0000 mg | ORAL_TABLET | Freq: Every day | ORAL | 0 refills | Status: AC

## 2016-11-15 MED ORDER — MAGNESIUM OXIDE 400 (241.3 MG) MG PO TABS: 400.0000 mg | ORAL_TABLET | Freq: Every day | ORAL | 1 refills | Status: DC

## 2016-11-15 MED ORDER — APIXABAN 5 MG PO TABS: 5.0000 mg | ORAL_TABLET | Freq: Two times a day (BID) | ORAL | 1 refills | Status: DC

## 2016-11-15 NOTE — Progress Notes (Signed)
Reviewed discharge instructions with patient. No issues at present. D/c instructions reviewed. Awaiting wheelchair for discharge.  Leeona Mccardle, Mervin Kung RN

## 2016-11-15 NOTE — Progress Notes (Signed)
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 male with history of cirrhosis of the liver, hepatitis C, alcohol abuse, nonischemic cardiomyopathy, stroke with left-sided hemiparesis, hypothyroidism, medical noncompliance was brought to the ER after patient had a fall at his home. Patient states he was trying to walk to the bathroom when he tripped on a rug and fell on the bed. CT of the head and C-spine was unremarkable. Patient was started on Cardizem infusion after bolus for A. fib with RVR. Patient states he only takes lactulose and tramadol for his known history of cirrhosis but has not been taking any of his other medications for A. fib or hypothyroidism. Patient states he was not prescribed. Patient states his last alcoholic drink was 10 days ago.   Patient was started on Cardizem infusion after bolus for A. fib with RVR. EKG shows A. fib with RVR. Chest x-ray unremarkable. CT head and C-spine was unremarkable. Labs also revealed metabolic acidosis with acute renal failure.  Assessment/Plan 1. A. fib with RVR - probably secondary to noncompliance with medications. Heart rate appears to be controlled at rest. Patient has not been taking his Apixaban or metoprolol. Patient  was initially placed on Cardizem  infusion held due to low BP . Restarted Apixaban postadmission  . Chads 2 vasc score is 6. Cardiology to consulted .repeat echo shows EF 40-45%. Cardiology restarted metoprolol at a lower dose. Both potassium and magnesium have been supplemented. Restarted midodrine to support blood pressure.No cardioversion needed. Tolerating atrial fibrillation/flutter. Doubt that he would stay in sinus rhythm. Cardiology has no further recommendations and they have signed off. 2. Acute renal failure with metabolic acidosis - not sure of the cause likely prerenal in the setting of urinary tract infection. Patient has had previous history of diarrhea but denies having any at this time. If patient's lactate level elevated will give fluid bolus. BUN/creatinine significantly improved, creatinine 1.09 3. Hypothyroidism with elevated TSH ,  free T4 1.2- probably noncompliant with Synthroid. Continue Synthroid at 50 g by mouth daily home dose previously prescribed. 4. Leukopenia and thrombocytopenia probably from alcoholism and cirrhosis - follow platelets closely. Platelet count stable in the 70s. Hemoglobin stable 5. History of stroke with left-sided hemiparesis - will restart Apixaban. 6. History of cirrhosis of the liver with hepatitis C and alcoholism and hepatocellular carcinoma - patient is on lactulose. 7. History of DVT - will restart 8. History of nonischemic cardiomyopathy last EF measured in September 2017 was 40-45% - appears dry at this time. 9. History of alcoholism - patient states his last alcoholic drink was 10 days ago. Closely observe. Continue CIWA. UDS negative. No signs of withdrawal. Social work consult to assess on situation 10. Urinary tract infection-will start patient on Rocephin. Urine  culture ordered, patient also found to have hematuria on urinalysis. Will order renal ultrasound to rule out nephrolithiasis    DVT prophylaxsis eliquis  Code Status:  Full code      Code Status Orders       Family Communication: Discussed in detail with the patient, all imaging results, lab results explained to the patient   Disposition Plan:  PT OT evaluation. Anticipate discharge 2/12      Consultants: Cardiology  Procedures:  None   Antibiotics: Anti-infectives    None         HPI/Subjective: Hemodynamically stable, blood pressure continues to be soft but improved  Objective: Vitals:   11/14/16 1120 11/14/16 1536 11/14/16 1937 11/15/16 0425  BP: 99/80 (!) 87/62 107/72 114/89  Pulse: 98 66 71 89  Resp: 14 18 18 18   Temp: 97.5 F (36.4 C) 97.8 F (36.6 C) 97.7 F (36.5 C) 97.4 F (36.3 C)  TempSrc: Oral Oral Oral Oral  SpO2: 96% 100% 100% 98%  Weight:    87.4 kg (192 lb 10.9 oz)  Height:        Intake/Output Summary (Last 24 hours) at 11/15/16 [id number] Last data filed at 11/14/16 1200  Gross per 24 hour  Intake              300 ml  Output                0 ml  Net              300 ml    Exam:  Examination:  General exam: Appears calm and comfortable  Respiratory system: Clear to auscultation. Respiratory effort normal. Cardiovascular system: S1 & S2 heard,irre rr. No JVD, murmurs, rubs, gallops or clicks. No pedal edema. Gastrointestinal system: Abdomen is nondistended, soft and nontender. No organomegaly or masses felt. Normal bowel sounds heard. Central nervous system: Alert and oriented. No focal neurological deficits. Extremities: Symmetric 5 x 5 power. Skin: No rashes, lesions or ulcers Psychiatry: Judgement and insight appear normal. Mood & affect appropriate.     Data Reviewed: I have personally reviewed following labs and imaging studies  Micro Results Recent Results (from the past 240 hour(s))  MRSA PCR Screening     Status: None   Collection Time: 11/13/16  4:17 AM  Result Value Ref Range Status   MRSA by PCR NEGATIVE NEGATIVE Final    Comment:        The GeneXpert MRSA Assay (FDA approved for NASAL specimens only), is one component of  a comprehensive MRSA colonization surveillance program. It is not intended to diagnose MRSA infection nor to guide or monitor treatment for MRSA infections.     Radiology Reports Ct Head Wo Contrast  Result Date: 11/12/2016 CLINICAL DATA:  76 year old male status post fall on blood thinners. Initial encounter. EXAM: CT HEAD WITHOUT CONTRAST CT CERVICAL SPINE WITHOUT CONTRAST TECHNIQUE: Multidetector CT imaging of the head and cervical spine was performed following the standard protocol without intravenous contrast. Multiplanar CT image reconstructions of the cervical spine were also generated. COMPARISON:  Head CT without contrast 08/20/2016. Head and cervical spine CT 08/12/2016. FINDINGS: CT HEAD FINDINGS Brain: Extensive right MCA territory encephalomalacia re - demonstrated and largely related to the September 2017 infarct. Superimposed chronic posterior left MCA territory encephalomalacia. Stable cerebral volume. Stable gray-white matter differentiation throughout the brain. No acute intracranial hemorrhage identified. No midline shift, mass effect, or evidence of intracranial mass lesion. No ventriculomegaly. No new cortically based infarct identified.  Vascular: Calcified atherosclerosis at the skull base. Skull: Intact.  No acute osseous abnormality identified. Sinuses/Orbits: Visualized paranasal sinuses and mastoids are stable and well pneumatized. Other: No acute orbit or scalp soft tissue finding. CT CERVICAL SPINE FINDINGS Alignment: Improved cervical lordosis compared to November. Cervicothoracic junction alignment is within normal limits. Bilateral posterior element alignment is within normal limits. Skull base and vertebrae: Visualized skull base is intact. No atlanto-occipital dissociation. No acute cervical spine fracture identified. Soft tissues and spinal canal: No prevertebral fluid or swelling. No visible canal hematoma. Negative noncontrast neck soft tissues. Disc levels: Mild  degenerative cervical spinal stenosis suspected at C3-C4 in part related to broad-based disc (series 302, image 62). Upper chest: Visible upper thoracic levels appear intact. Negative lung apices. Negative visualized superior noncontrast mediastinum. IMPRESSION: 1. No acute intracranial abnormality. Stable noncontrast CT appearance of the brain with extensive right worse than left chronic MCA territory ischemia. 2.  No acute fracture or listhesis identified in the cervical spine. 3. Mild degenerative cervical spinal stenosis suspected at C3-C4. Electronically Signed   By: Genevie Ann M.D.   On: 11/12/2016 17:11   Ct Cervical Spine Wo Contrast  Result Date: 11/12/2016 CLINICAL DATA:  76 year old male status post fall on blood thinners. Initial encounter. EXAM: CT HEAD WITHOUT CONTRAST CT CERVICAL SPINE WITHOUT CONTRAST TECHNIQUE: Multidetector CT imaging of the head and cervical spine was performed following the standard protocol without intravenous contrast. Multiplanar CT image reconstructions of the cervical spine were also generated. COMPARISON:  Head CT without contrast 08/20/2016. Head and cervical spine CT 08/12/2016. FINDINGS: CT HEAD FINDINGS Brain: Extensive right MCA territory encephalomalacia re - demonstrated and largely related to the September 2017 infarct. Superimposed chronic posterior left MCA territory encephalomalacia. Stable cerebral volume. Stable gray-white matter differentiation throughout the brain. No acute intracranial hemorrhage identified. No midline shift, mass effect, or evidence of intracranial mass lesion. No ventriculomegaly. No new cortically based infarct identified. Vascular: Calcified atherosclerosis at the skull base. Skull: Intact.  No acute osseous abnormality identified. Sinuses/Orbits: Visualized paranasal sinuses and mastoids are stable and well pneumatized. Other: No acute orbit or scalp soft tissue finding. CT CERVICAL SPINE FINDINGS Alignment: Improved cervical lordosis  compared to November. Cervicothoracic junction alignment is within normal limits. Bilateral posterior element alignment is within normal limits. Skull base and vertebrae: Visualized skull base is intact. No atlanto-occipital dissociation. No acute cervical spine fracture identified. Soft tissues and spinal canal: No prevertebral fluid or swelling. No visible canal hematoma. Negative noncontrast neck soft tissues. Disc levels: Mild degenerative cervical spinal stenosis suspected at C3-C4 in part related to broad-based disc (series 302, image 62). Upper chest: Visible upper thoracic levels appear intact. Negative lung apices. Negative visualized superior noncontrast mediastinum. IMPRESSION: 1. No acute intracranial abnormality. Stable noncontrast CT appearance of the brain with extensive right worse than left chronic MCA territory ischemia. 2.  No acute fracture or listhesis identified in the cervical spine. 3. Mild degenerative cervical spinal stenosis suspected at C3-C4. Electronically Signed   By: Genevie Ann M.D.   On: 11/12/2016 17:11   Dg Pelvis Portable  Result Date: 11/13/2016 CLINICAL DATA:  Pain following fall EXAM: PORTABLE PELVIS 1-2 VIEWS COMPARISON:  None. FINDINGS: There is no evidence of pelvic fracture or dislocation. There is slight symmetric narrowing of both hip joints. No erosive change. IMPRESSION: Slight symmetric narrowing both hip joints. No fracture or dislocation. Electronically Signed   By: Lowella Grip III M.D.   On: 11/13/2016 08:58   Dg Chest  Portable 1 View  Result Date: 11/12/2016 CLINICAL DATA:  76 year old male status post fall. Dizziness, atrial fibrillation with rapid ventricular response. Lethargy. Initial encounter. EXAM: PORTABLE CHEST 1 VIEW COMPARISON:  08/12/2016 and earlier. FINDINGS: Portable AP semi upright view at 1547 hours. Stable mild-to-moderate cardiomegaly. Stable mild tortuosity of the thoracic aorta. Calcified aortic atherosclerosis. Other mediastinal  contours are within normal limits. Visualized tracheal air column is within normal limits. Allowing for portable technique the lungs are clear. No pneumothorax. Negative visible bowel gas pattern. Chronic left lateral fourth rib fracture. IMPRESSION: No acute cardiopulmonary abnormality. Stable cardiomegaly.  Calcified aortic atherosclerosis. Electronically Signed   By: Genevie Ann M.D.   On: 11/12/2016 15:56     CBC  Recent Labs Lab 11/12/16 1619 11/13/16 0331 11/14/16 0333  WBC 3.1* 7.7 3.7*  HGB 17.5* 15.5 15.8  HCT 48.1 43.0 44.4  PLT 79* 78* 71*  MCV 93.2 92.7 94.7  MCH 33.9 33.4 33.7  MCHC 36.4* 36.0 35.6  RDW 18.4* 18.4* 19.1*  LYMPHSABS 0.6*  --   --   MONOABS 0.3  --   --   EOSABS 0.0  --   --   BASOSABS 0.0  --   --     Chemistries   Recent Labs Lab 11/12/16 1619 11/12/16 2204 11/13/16 0331 11/13/16 0404 11/14/16 0333 11/15/16 0332  NA 140  --  135  --  136 136  K 3.4*  --  3.0*  --  4.6 4.3  CL 104  --  102  --  106 109  CO2 18*  --  17*  --  16* 20*  GLUCOSE 130*  --  137*  --  102* 79  BUN 9  --  10  --  11 12  CREATININE 1.44*  --  1.55*  --  1.09 0.93  CALCIUM 8.7*  --  8.2*  --  8.1* 8.3*  MG  --  1.6*  --  1.7 1.9  --   AST 177*  --   --   --  136*  --   ALT 53  --   --   --  43  --   ALKPHOS 96  --   --   --  82  --   BILITOT 2.6*  --   --   --  1.8*  --    ------------------------------------------------------------------------------------------------------------------ estimated creatinine clearance is 74 mL/min (by C-G formula based on SCr of 0.93 mg/dL). ------------------------------------------------------------------------------------------------------------------ No results for input(s): HGBA1C in the last 72 hours. ------------------------------------------------------------------------------------------------------------------ No results for input(s): CHOL, HDL, LDLCALC, TRIG, CHOLHDL, LDLDIRECT in the last 72  hours. ------------------------------------------------------------------------------------------------------------------  Recent Labs  11/12/16 1619  TSH 10.580*   ------------------------------------------------------------------------------------------------------------------ No results for input(s): VITAMINB12, FOLATE, FERRITIN, TIBC, IRON, RETICCTPCT in the last 72 hours.  Coagulation profile No results for input(s): INR, PROTIME in the last 168 hours.  No results for input(s): DDIMER in the last 72 hours.  Cardiac Enzymes  Recent Labs Lab 11/12/16 2204 11/13/16 0331 11/13/16 1010  TROPONINI 0.05* 0.05* 0.04*   ------------------------------------------------------------------------------------------------------------------ Invalid input(s): POCBNP   CBG: No results for input(s): GLUCAP in the last 168 hours.     Studies: No results found.    Lab Results  Component Value Date   HGBA1C 4.5 (L) 06/26/2016   HGBA1C 4.6 (L) 04/19/2016   Lab Results  Component Value Date   LDLCALC 38 06/26/2016   CREATININE 0.93 11/15/2016       Scheduled Meds: . apixaban  5  mg Oral BID  . feeding supplement (ENSURE ENLIVE)  237 mL Oral BID BM  . folic acid  1 mg Oral Daily  . levothyroxine  50 mcg Oral QAC breakfast  . metoprolol tartrate  12.5 mg Oral BID  . midodrine  10 mg Oral TID WC  . multivitamin with minerals  1 tablet Oral Daily  . thiamine  100 mg Oral Daily   Or  . thiamine  100 mg Intravenous Daily   Continuous Infusions:    LOS: 2 days    Time spent: >30 MINS    [facility]  [facility] Pager [phone]. If 7PM-7AM, please contact night-coverage at [email or web address], password [facility] 11/15/2016, 8:07 AM  LOS: 2 days

## 2016-11-15 NOTE — Care Management Note (Signed)
Case Management Note Previous CM note initiated by Lacretia Leigh, RN 11/13/2016, 10:45 AM   Patient Details  Name: Nathan Frank MRN: [id number] Date of Birth: [birth year]  Subjective/Objective:        Adm w at fib            Action/Plan: lives at home   Expected Discharge Date:  11/15/16               Expected Discharge Plan:  [place]  In-House Referral:     Discharge planning Services  CM Consult, Medication Assistance  Post Acute Care Choice:  Home Health Choice offered to:  Patient  DME Arranged:    DME Agency:     HH Arranged:  RN, PT, OT, Nurse's Aide [place] Agency:  [place]  Status of Service:  Completed, signed off  If discussed at [place] of Stay Meetings, dates discussed:    Additional Comments:  11/15/16- 1420- Nathan Ginsberg RN, CM- pt for d/c home today- lives with wife- orders for HHRN/PT/OT/aide- spoke with pt at bedside- choice offered for [facility] services in [facility]- per pt he has used [facility] in past- would like to use them again- call made to [place] with [facility] for [facility] referral- however [facility] can not accept referral at this time- per pt he does not have a second preference as long a agency can work with insurance- call made to [employer] with [facility]- who can accept referral for [facility] services. - Pt informed of this and agreeable to [facility]. Pt states he already has needed DME that includes BSC, RW, and w/c at home.  Per pt he has been on Eliquis in the past and has no difficulty affording drug.   Lacretia Leigh, RN 11/13/2016, 10:45 AM-- pt has [facility] medicare for meds. Gave pt 30day free eliquis card. Had been on pta so may have used card in past.S/W  Nathan Frank @ [place] # [phone]   ELIQUIS 5 MG BID   COVER- YES  CO-PAY- $ 8.35  TIER- 3 DRUG  PRIOR APPROVAL- NO  PHARMACY : [facility]  Dawayne Patricia, RN 11/15/2016, 2:22 PM

## 2016-11-15 NOTE — Evaluation (Addendum)
Physical Therapy Evaluation Patient Details Name: Nathan Frank MRN: [id number] DOB: [birth year] Today's Date: 11/15/2016   History of Present Illness  Patient is a 76 yo male admitted 11/12/16 after fall in [place] with RVR, with acute renal failure and UTI.     PMH:  cirrhosis of the liver, hepatitis C, alcohol abuse, nonischemic cardiomyopathy, stroke with left-sided hemiparesis, hypothyroidism, CHF, medical noncompliance  Clinical Impression  Patient presents with problems listed below.  Patient able to ambulate 73' with RW and min guard assist.  To d/c home today.  Recommend HHPT for continued therapy at d/c.    Follow Up Recommendations Home health PT;Supervision for mobility/OOB    Equipment Recommendations  None recommended by PT    Recommendations for Other Services       Precautions / Restrictions Precautions Precautions: Fall Precaution Comments: Fall pta Restrictions Weight Bearing Restrictions: No      Mobility  Bed Mobility Overal bed mobility: Modified Independent             General bed mobility comments: Increased time  Transfers Overall transfer level: Needs assistance Equipment used: Rolling walker (2 wheeled) Transfers: Sit to/from Stand Sit to Stand: Min assist         General transfer comment: Patient using correct hand placement.  Assist to rise to standing and for balance.  Ambulation/Gait Ambulation/Gait assistance: Min guard Ambulation Distance (Feet): 34 Feet Assistive device: Rolling walker (2 wheeled) Gait Pattern/deviations: Step-through pattern;Decreased step length - right;Decreased step length - left;Decreased stride length;Shuffle;Trunk flexed Gait velocity: decreased Gait velocity interpretation: Below normal speed for age/gender General Gait Details: Patient with slow, shuffling gait, with flexed posture.  Cues to stand upright during gait.  Assist for safety/balance.  Stairs            Wheelchair Mobility    Modified  [place] (Stroke Patients Only)       Balance Overall balance assessment: Needs assistance;History of Falls         Standing balance support: Bilateral upper extremity supported Standing balance-Leahy Scale: Poor                               Pertinent Vitals/Pain Pain Assessment: No/denies pain    Home Living Family/patient expects to be discharged to:: Private residence Living Arrangements: Spouse/significant other Available Help at Discharge: Family;Available 24 hours/day Type of Home: House Home Access: Stairs to enter Entrance Stairs-Rails: Right;Left;Can reach both Entrance Stairs-Number of Steps: 2 Home Layout: One level Home Equipment: Cane - single point;Grab bars - tub/shower;Hand held shower head;Walker - 2 wheels;Tub bench;Wheelchair - manual;Bedside commode      Prior Function Level of Independence: Needs assistance   Gait / Transfers Assistance Needed: Uses RW for gait "since stroke" and wife is with him most of the time he walks.  ADL's / Homemaking Assistance Needed: assist with adls, meal prep, housekeeping.  Pt needs help getting into shower but then can bathe, needs help walking to toilet but independent when sitting and requires assist with LE dressing.        Hand Dominance   Dominant Hand: Right    Extremity/Trunk Assessment   Upper Extremity Assessment Upper Extremity Assessment: Generalized weakness    Lower Extremity Assessment Lower Extremity Assessment: Generalized weakness (Lt weakness greater than Rt)    Cervical / Trunk Assessment Cervical / Trunk Assessment: Kyphotic  Communication   Communication: No difficulties  Cognition Arousal/Alertness: Awake/alert Behavior During Therapy: [facility]  for tasks assessed/performed;Flat affect Overall Cognitive Status: No family/caregiver present to determine baseline cognitive functioning                      General Comments      Exercises     Assessment/Plan    PT  Assessment Patient needs continued PT services;All further PT needs can be met in the next venue of care  PT Problem List Decreased strength;Decreased activity tolerance;Decreased balance;Decreased mobility          PT Treatment Interventions      PT Goals (Current goals can be found in the Care Plan section)  Acute Rehab PT Goals PT Goal Formulation: All assessment and education complete, DC therapy (To have f/u HHPT)    Frequency  (f/u with HHPT)   Barriers to discharge        Co-evaluation               End of Session Equipment Utilized During Treatment: Gait belt Activity Tolerance: Patient tolerated treatment well Patient left: in bed;with call bell/phone within reach;with bed alarm set;with family/visitor present (sitting EOB) Nurse Communication: Mobility status (Family in room; Asking about d/c)         Time: 1412-14:24 and 14:58-1505 PT Time Calculation (min) (ACUTE ONLY): 12 min + 7 min = 19 min total   Charges:   PT Evaluation $PT Eval Moderate Complexity: 1 Procedure     PT G Codes:        Despina Pole 11/20/16, 3:18 PM Carita Pian. Sanjuana Kava, [place] Pager [phone]

## 2016-11-15 NOTE — Discharge Summary (Signed)
Physician Discharge Summary  Nathan Frank MRN: [id number] DOB/AGE: 1941/05/30 76 y.o.  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   Polysubstance abuse   Chronic systolic CHF (congestive heart failure) ([place])   ARF (acute renal failure) (HCC)   Syncope   AKI (acute kidney injury) ([place])   Demand ischemia ([place])   NICM (nonischemic cardiomyopathy) ([place])    Follow-up recommendations Follow-up with PCP in 3-5 days , including all  additional recommended appointments as below Follow-up CBC, CMP in 3-5 days Patient to follow-up with cardiology for atrial fibrillation management      Current Discharge Medication List    START taking these medications   Details  cephALEXin (KEFLEX) 500 MG capsule Take 1 capsule (500 mg total) by mouth 3 (three) times daily. Qty: 15 capsule, Refills: 0      CONTINUE these medications which have CHANGED   Details  apixaban (ELIQUIS) 5 MG TABS tablet Take 1 tablet (5 mg total) by mouth 2 (two) times daily. Qty: 60 tablet, Refills: 1    magnesium oxide (MAG-OX) 400 (241.3 Mg) MG tablet Take 1 tablet (400 mg total) by mouth daily. Qty: 30 tablet, Refills: 1    metoprolol tartrate (LOPRESSOR) 25 MG tablet Take 0.5 tablets (12.5 mg total) by mouth 2 (two) times daily. Qty: 30 tablet, Refills: 2    midodrine (PROAMATINE) 10 MG tablet Take 1 tablet (10 mg total) by mouth 3 (three) times daily with meals. Qty: 90 tablet, Refills: 2    thiamine 100 MG tablet Take 1 tablet (100 mg total) by mouth daily. Qty: 30 tablet, Refills: 0      CONTINUE these medications  which have NOT CHANGED   Details  Multiple Vitamin (MULTIVITAMIN WITH MINERALS) TABS tablet Take 1 tablet by mouth daily. Qty: 30 tablet, Refills: 0    traMADol (ULTRAM) 50 MG tablet Take 50 mg by mouth 3 (three) times daily as needed for pain.    folic acid (FOLVITE) 1 MG tablet Take 1 tablet (1 mg total) by mouth daily. Qty: 30 tablet, Refills: 0    levothyroxine (SYNTHROID, LEVOTHROID) 50 MCG tablet Take 1 tablet (50 mcg total) by mouth daily before breakfast. Qty: 30 tablet, Refills: 0      STOP taking these medications     amitriptyline (ELAVIL) 10 MG tablet          Discharge Condition: Overall prognosis guarded, concern for noncompliance  Discharge Instructions Get Medicines reviewed and adjusted: Please take all your medications with you for your next visit with your Primary MD  Please request your Primary MD to go over all hospital tests and procedure/radiological results at the follow up, please ask your Primary MD to get all Hospital records sent to his/her office.  If you experience worsening of your admission symptoms, develop shortness of breath, life threatening emergency, suicidal or homicidal thoughts you must seek medical attention immediately by calling 911 or calling your MD immediately if symptoms less severe.  You must read complete instructions/literature along with all the possible adverse reactions/side effects for all the Medicines you take and that have  been prescribed to you. Take any new Medicines after you have completely understood and accpet all the possible adverse reactions/side effects.   Do not drive when taking Pain medications.   Do not take more than prescribed Pain, Sleep and Anxiety Medications  Special Instructions: If you have smoked or chewed Tobacco in the last 2 yrs please stop smoking, stop any regular Alcohol and or any Recreational drug use.  Wear Seat belts while driving.  Please note  You were cared for by a hospitalist  during your hospital stay. Once you are discharged, your primary care physician will handle any further medical issues. Please note that NO REFILLS for any discharge medications will be authorized once you are discharged, as it is imperative that you return to your primary care physician (or establish a relationship with a primary care physician if you do not have one) for your aftercare needs so that they can reassess your need for medications and monitor your lab values.  Discharge Instructions    AMB Referral to [facility]    Complete by:  As directed    Please assign to [place] and [facility] Pharmacist. Has had x7 hospitalizations in past 6 months and has history of medication non-compliance. Currently at [facility]. Thanks. Marthenia Rolling, [place], [facility] [phone]   Reason for consult:  Please assign to [facility] Pharmacist   Expected date of contact:  1-3 days (reserved for hospital discharges)   Diet - low sodium heart healthy    Complete by:  As directed    Increase activity slowly    Complete by:  As directed        Allergies  Allergen Reactions  . Nsaids Other (See Comments)    Acute gastric ulcer  . Oxycodone Other (See Comments)    as per the risk calculating tool RIOSORD (Risk Index for Overdose or Serious Opioid -induced Respiratory Depression Risk ) calculated risk in ensuing 6 months  = 83% ( see Problem List for discussion)      Disposition: Home with wife   Consults:  Cardiology    Significant Diagnostic Studies:  Ct Head Wo Contrast  Result Date: 11/12/2016 CLINICAL DATA:  76 year old male status post fall on blood thinners. Initial encounter. EXAM: CT HEAD WITHOUT CONTRAST CT CERVICAL SPINE WITHOUT CONTRAST TECHNIQUE: Multidetector CT imaging of the head and cervical spine was performed following the standard protocol without intravenous contrast. Multiplanar CT image reconstructions of the  cervical spine were also generated. COMPARISON:  Head CT without contrast 08/20/2016. Head and cervical spine CT 08/12/2016. FINDINGS: CT HEAD FINDINGS Brain: Extensive right MCA territory encephalomalacia re - demonstrated and largely related to the September 2017 infarct. Superimposed chronic posterior left MCA territory encephalomalacia. Stable cerebral volume. Stable gray-white matter differentiation throughout the brain. No acute intracranial hemorrhage identified. No midline shift, mass effect, or evidence of intracranial mass lesion. No ventriculomegaly. No new cortically based infarct identified. Vascular: Calcified atherosclerosis at the skull base. Skull: Intact.  No acute osseous abnormality identified. Sinuses/Orbits: Visualized paranasal sinuses and mastoids are stable and well pneumatized. Other: No acute orbit or scalp soft tissue finding. CT CERVICAL SPINE FINDINGS Alignment: Improved cervical lordosis compared to November. Cervicothoracic junction alignment is within normal limits. Bilateral posterior element alignment is within normal limits. Skull base and vertebrae: Visualized skull base is intact. No atlanto-occipital dissociation. No acute cervical spine fracture identified. Soft tissues and spinal canal: No prevertebral fluid or swelling. No visible  canal hematoma. Negative noncontrast neck soft tissues. Disc levels: Mild degenerative cervical spinal stenosis suspected at C3-C4 in part related to broad-based disc (series 302, image 62). Upper chest: Visible upper thoracic levels appear intact. Negative lung apices. Negative visualized superior noncontrast mediastinum. IMPRESSION: 1. No acute intracranial abnormality. Stable noncontrast CT appearance of the brain with extensive right worse than left chronic MCA territory ischemia. 2.  No acute fracture or listhesis identified in the cervical spine. 3. Mild degenerative cervical spinal stenosis suspected at C3-C4. Electronically Signed   By: Genevie Ann M.D.   On: 11/12/2016 17:11   Ct Cervical Spine Wo Contrast  Result Date: 11/12/2016 CLINICAL DATA:  76 year old male status post fall on blood thinners. Initial encounter. EXAM: CT HEAD WITHOUT CONTRAST CT CERVICAL SPINE WITHOUT CONTRAST TECHNIQUE: Multidetector CT imaging of the head and cervical spine was performed following the standard protocol without intravenous contrast. Multiplanar CT image reconstructions of the cervical spine were also generated. COMPARISON:  Head CT without contrast 08/20/2016. Head and cervical spine CT 08/12/2016. FINDINGS: CT HEAD FINDINGS Brain: Extensive right MCA territory encephalomalacia re - demonstrated and largely related to the September 2017 infarct. Superimposed chronic posterior left MCA territory encephalomalacia. Stable cerebral volume. Stable gray-white matter differentiation throughout the brain. No acute intracranial hemorrhage identified. No midline shift, mass effect, or evidence of intracranial mass lesion. No ventriculomegaly. No new cortically based infarct identified. Vascular: Calcified atherosclerosis at the skull base. Skull: Intact.  No acute osseous abnormality identified. Sinuses/Orbits: Visualized paranasal sinuses and mastoids are stable and well pneumatized. Other: No acute orbit or scalp soft tissue finding. CT CERVICAL SPINE FINDINGS Alignment: Improved cervical lordosis compared to November. Cervicothoracic junction alignment is within normal limits. Bilateral posterior element alignment is within normal limits. Skull base and vertebrae: Visualized skull base is intact. No atlanto-occipital dissociation. No acute cervical spine fracture identified. Soft tissues and spinal canal: No prevertebral fluid or swelling. No visible canal hematoma. Negative noncontrast neck soft tissues. Disc levels: Mild degenerative cervical spinal stenosis suspected at C3-C4 in part related to broad-based disc (series 302, image 62). Upper chest: Visible upper  thoracic levels appear intact. Negative lung apices. Negative visualized superior noncontrast mediastinum. IMPRESSION: 1. No acute intracranial abnormality. Stable noncontrast CT appearance of the brain with extensive right worse than left chronic MCA territory ischemia. 2.  No acute fracture or listhesis identified in the cervical spine. 3. Mild degenerative cervical spinal stenosis suspected at C3-C4. Electronically Signed   By: Genevie Ann M.D.   On: 11/12/2016 17:11   US Renal  Result Date: 11/15/2016 CLINICAL DATA:  Hematuria EXAM: RENAL / URINARY TRACT ULTRASOUND COMPLETE COMPARISON:  None. FINDINGS: Right Kidney: Length: 9.2 cm. Echogenicity within normal limits. No mass or hydronephrosis visualized. Left Kidney: Length: 9.6 cm. Echogenicity within normal limits. No mass or hydronephrosis visualized. Bladder: Appears normal for degree of bladder distention. IMPRESSION: No hydronephrosis.  No acute findings. Electronically Signed   By: Rolm Baptise M.D.   On: 11/15/2016 11:27   Dg Pelvis Portable  Result Date: 11/13/2016 CLINICAL DATA:  Pain following fall EXAM: PORTABLE PELVIS 1-2 VIEWS COMPARISON:  None. FINDINGS: There is no evidence of pelvic fracture or dislocation. There is slight symmetric narrowing of both hip joints. No erosive change. IMPRESSION: Slight symmetric narrowing both hip joints. No fracture or dislocation. Electronically Signed   By: Lowella Grip III M.D.   On: 11/13/2016 08:58   Dg Chest Portable 1 View  Result Date: 11/12/2016 CLINICAL DATA:  76 year old  male status post fall. Dizziness, atrial fibrillation with rapid ventricular response. Lethargy. Initial encounter. EXAM: PORTABLE CHEST 1 VIEW COMPARISON:  08/12/2016 and earlier. FINDINGS: Portable AP semi upright view at 1547 hours. Stable mild-to-moderate cardiomegaly. Stable mild tortuosity of the thoracic aorta. Calcified aortic atherosclerosis. Other mediastinal contours are within normal limits. Visualized tracheal air  column is within normal limits. Allowing for portable technique the lungs are clear. No pneumothorax. Negative visible bowel gas pattern. Chronic left lateral fourth rib fracture. IMPRESSION: No acute cardiopulmonary abnormality. Stable cardiomegaly.  Calcified aortic atherosclerosis. Electronically Signed   By: Genevie Ann M.D.   On: 11/12/2016 15:56    2-D echo  LV EF: 40% -   45%  ------------------------------------------------------------------- Indications:      Atrial fibrillation - 427.31.  ------------------------------------------------------------------- History:   PMH:   Congestive heart failure.  Risk factors:  Liver cirrhosis. History of alcohol and substance abuse.  ------------------------------------------------------------------- Study Conclusions  - Left ventricle: The cavity size was normal. Systolic function was   mildly to moderately reduced. The estimated ejection fraction was   in the range of 40% to 45%. Wall motion was normal; there were no   regional wall motion abnormalities. - Mitral valve: There was mild regurgitation. - Left atrium: The atrium was mildly dilated. - Tricuspid valve: There was moderate-severe regurgitation.  Impressions:  - This is a very poor quality study, additional images with   echocontrast are recommended.   LVEF 40-45%, there is basal and mid inferolateral and   anteroseptal hypokinesis.   RV poorly visualized, RVEF can&'t be evaluated.  Filed Weights   11/13/16 0400 11/14/16 0336 11/15/16 0425  Weight: 81.5 kg (179 lb 10.8 oz) 85 kg (187 lb 6.3 oz) 87.4 kg (192 lb 10.9 oz)     Microbiology: Recent Results (from the past 240 hour(s))  MRSA PCR Screening     Status: None   Collection Time: 11/13/16  4:17 AM  Result Value Ref Range Status   MRSA by PCR NEGATIVE NEGATIVE Final    Comment:        The GeneXpert MRSA Assay (FDA approved for NASAL specimens only), is one component of a comprehensive MRSA  colonization surveillance program. It is not intended to diagnose MRSA infection nor to guide or monitor treatment for MRSA infections.        Blood Culture    Component Value Date/Time   SDES URINE, CLEAN CATCH 08/20/2016 2316   SPECREQUEST NONE 08/20/2016 2316   CULT NO GROWTH 08/20/2016 2316   REPTSTATUS 08/22/2016 FINAL 08/20/2016 2316      Labs: Results for orders placed or performed during the hospital encounter of 11/12/16 (from the past 48 hour(s))  T4, free     Status: Abnormal   Collection Time: 11/13/16  4:01 PM  Result Value Ref Range   Free T4 1.20 (H) 0.61 - 1.12 ng/dL    Comment: (NOTE) Biotin ingestion may interfere with free T4 tests. If the results are inconsistent with the TSH level, previous test results, or the clinical presentation, then consider biotin interference. If needed, order repeat testing after stopping biotin.   Comprehensive metabolic panel     Status: Abnormal   Collection Time: 11/14/16  3:33 AM  Result Value Ref Range   Sodium 136 135 - 145 mmol/L   Potassium 4.6 3.5 - 5.1 mmol/L    Comment: DELTA CHECK NOTED   Chloride 106 101 - 111 mmol/L   CO2 16 (L) 22 - 32 mmol/L   Glucose,  Bld 102 (H) 65 - 99 mg/dL   BUN 11 6 - 20 mg/dL   Creatinine, Ser 1.09 0.61 - 1.24 mg/dL   Calcium 8.1 (L) 8.9 - 10.3 mg/dL   Total Protein 6.2 (L) 6.5 - 8.1 g/dL   Albumin 2.0 (L) 3.5 - 5.0 g/dL   AST 136 (H) 15 - 41 U/L   ALT 43 17 - 63 U/L   Alkaline Phosphatase 82 38 - 126 U/L   Total Bilirubin 1.8 (H) 0.3 - 1.2 mg/dL   GFR calc non Af Amer >60 >60 mL/min   GFR calc Af Amer >60 >60 mL/min    Comment: (NOTE) The eGFR has been calculated using the CKD EPI equation. This calculation has not been validated in all clinical situations. eGFR's persistently <60 mL/min signify possible Chronic Kidney Disease.    Anion gap 14 5 - 15  CBC     Status: Abnormal   Collection Time: 11/14/16  3:33 AM  Result Value Ref Range   WBC 3.7 (L) 4.0 - 10.5 K/uL    RBC 4.69 4.22 - 5.81 MIL/uL   Hemoglobin 15.8 13.0 - 17.0 g/dL   HCT 44.4 39.0 - 52.0 %   MCV 94.7 78.0 - 100.0 fL   MCH 33.7 26.0 - 34.0 pg   MCHC 35.6 30.0 - 36.0 g/dL   RDW 19.1 (H) 11.5 - 15.5 %   Platelets 71 (L) 150 - 400 K/uL    Comment: CONSISTENT WITH PREVIOUS RESULT  Magnesium     Status: None   Collection Time: 11/14/16  3:33 AM  Result Value Ref Range   Magnesium 1.9 1.7 - 2.4 mg/dL  Basic metabolic panel     Status: Abnormal   Collection Time: 11/15/16  3:32 AM  Result Value Ref Range   Sodium 136 135 - 145 mmol/L   Potassium 4.3 3.5 - 5.1 mmol/L   Chloride 109 101 - 111 mmol/L   CO2 20 (L) 22 - 32 mmol/L   Glucose, Bld 79 65 - 99 mg/dL   BUN 12 6 - 20 mg/dL   Creatinine, Ser 0.93 0.61 - 1.24 mg/dL   Calcium 8.3 (L) 8.9 - 10.3 mg/dL   GFR calc non Af Amer >60 >60 mL/min   GFR calc Af Amer >60 >60 mL/min    Comment: (NOTE) The eGFR has been calculated using the CKD EPI equation. This calculation has not been validated in all clinical situations. eGFR's persistently <60 mL/min signify possible Chronic Kidney Disease.    Anion gap 7 5 - 15     Lipid Panel     Component Value Date/Time   CHOL 87 06/26/2016 0500   TRIG 91 06/27/2016 0219   HDL 26 (L) 06/26/2016 0500   CHOLHDL 3.3 06/26/2016 0500   VLDL 23 06/26/2016 0500   LDLCALC 38 06/26/2016 0500     Lab Results  Component Value Date   HGBA1C 4.5 (L) 06/26/2016   HGBA1C 4.6 (L) 04/19/2016     Lab Results  Component Value Date   LDLCALC 38 06/26/2016   CREATININE 0.93 11/15/2016     HPI :  Nathan Frank a 76 y.o.malewith history of cirrhosis of the liver, hepatitis C, alcohol abuse, nonischemic cardiomyopathy, stroke with left-sided hemiparesis, hypothyroidism, medical noncompliance was brought to the ER after patient had a fall at his home. Patient states he was trying to walk to the bathroom when he tripped on a rugand fell on the bed. CT of the head and C-spine  was unremarkable.  Patient was started on Cardizem infusion after bolus for A. fib with RVR. Patient states he only takes lactulose and tramadol for his known history of cirrhosis but has not been taking any of his other medications for A. fib or hypothyroidism.  Patient states his last alcoholic drink was 10 days ago.  Patient was started on Cardizem infusion after bolus for A. fib with RVR. EKG shows A. fib with RVR. Chest x-ray unremarkable. CT head and C-spine was unremarkable. Labs also revealed metabolic acidosis with acute renal failure.  Hospital course 1. A. fib with RVR- probably secondary to noncompliance with medications. Heart rate appears to be controlled at rest. Patient has not been taking his Apixaban or metoprolol. Patient  was initially placed on Cardizem infusion held due to low BP . Restarted Apixaban postadmission  . Chads 2 vasc score is 6. Cardiology   consulted .repeat echo shows EF 40-45%. Cardiology restarted metoprolol at a lower dose. Both potassium and magnesium have been supplemented. Restarted midodrine to support blood pressure.No cardioversion needed per cardiology. Tolerating atrial fibrillation/flutter. Doubt that he would stay in sinus rhythm. Cardiology has no further recommendations and they have signed off. 2. Acute renal failure with metabolic acidosis- not sure of the cause likely prerenal in the setting of urinary tract infection. Patient has had previous history of diarrhea but denies having any at this time.  BUN/creatinine significantly improved, creatinine 1.09 3. Hypothyroidismwith elevated TSH , low  free T4 1.2- probably noncompliant with Synthroid. Continue Synthroid at 50 g by mouth daily home dose previously prescribed. 4. Leukopenia and thrombocytopeniaprobably from alcoholism and cirrhosis - follow platelets closely. Platelet count stable in the 70s. Hemoglobin stable 5. History of strokewith left-sided hemiparesis - continue Apixaban. 6. History of cirrhosis of  the liver with hepatitis Cand alcoholism and hepatocellular carcinoma - patient is on lactulose. 7. History of DVT- will restart 8. History of nonischemic cardiomyopathy last EF measured in September 2017 was 40-45% - appears dry at this time. 9. History of alcoholism- patient states his last alcoholic drink was 10 days ago. Closely observe. Continue CIWA. UDS negative. No signs of withdrawal. Social work consult to assess on situation 10. Urinary tract infection-will start patient on Rocephin. Urine culture ordered, patient also found to have hematuria on urinalysis.  renal ultrasound  negative for nephrolithiasis      Discharge Exam: *  Blood pressure 114/89, pulse 89, temperature 97.4 F (36.3 C), temperature source Oral, resp. rate 18, height 5' 9"  (1.753 m), weight 87.4 kg (192 lb 10.9 oz), SpO2 98 %.  GEN:No acute distress.   Neck:No JVD Cardiac:IRIR, no murmurs, rubs, or gallops.  Respiratory:Clear to auscultation bilaterally. ON:GEXB, nontender, non-distended  MS:No edema; No deformity.Trace lower extremity edema Neuro:Nonfocal  Psych: Normal affect     Follow-up Information    Thressa Sheller, MD. Call.   Specialty:  Internal Medicine Why:  for follow up in 3-5 days Contact information: [place], [place] [address] [phone]        [name], MD. Call.   Specialty:  Cardiology Why:  to make follow up for a fib  Contact information: [address] [place] [address] [phone]           Signed: Reyne Dumas 11/15/2016, 2:00 PM        Time spent >45 mins

## 2016-11-16 ENCOUNTER — Other Ambulatory Visit: Payer: Self-pay | Admitting: *Deleted

## 2016-11-16 NOTE — Patient Outreach (Signed)
[facility]) Care Management  11/16/2016  THELMON REINHOLTZ August 04, 1941 [id number]   New referral received from hospital liaison to initiate transition of care program once member discharged.  He was admitted on 2/8 for atrial fibrillation, discharged on 2/11.  Call placed to listed contact number, [phone] (which was also confirmed by hospital liaison).  No answer, HIPAA compliant voice message left.    This care manager has received multiple referrals for member, but have been unsuccessful with establishing/maintaining contact with him (using same contact number), resulting in case closures.  Will make second attempt to contact tomorrow.  [name], [place], MSN [place] [phone]

## 2016-11-17 ENCOUNTER — Other Ambulatory Visit: Payer: Self-pay | Admitting: *Deleted

## 2016-11-17 NOTE — Patient Outreach (Signed)
[facility]) Care Management  11/17/2016  Nathan Frank 06-04-1941 [id number]   2nd attempt made to contact member, unsuccessful.  HIPAA compliant voice message left.  Will await call back.  If no call back, will make 3rd attempt tomorrow.    Call placed to member's listed primary MD office to inquire about alternate contact number.  Spoke with Nathan Frank, office has same contact number [phone]).  This care manager inquired about contact from member to schedule hospital follow up.  She state no follow up appointment has been made.  Will proceed with 3rd outreach attempt tomorrow.  [name], [place], MSN [place] [phone]

## 2016-11-18 ENCOUNTER — Encounter: Payer: Self-pay | Admitting: *Deleted

## 2016-11-18 ENCOUNTER — Other Ambulatory Visit: Payer: Self-pay | Admitting: *Deleted

## 2016-11-18 NOTE — Patient Outreach (Signed)
[facility]) Care Management  11/18/2016  BRECKIN BOGUE 1941/04/09 [id number]   3rd unsuccessful attempt made to contact member to initiate transition of care program.  HIPAA compliant voice message left, will await call back.  Will send unsuccessful outreach letter to member, if no response in 10 days, will close case.  [name], [place], MSN [place] [phone]

## 2016-11-19 ENCOUNTER — Other Ambulatory Visit: Payer: Self-pay | Admitting: Pharmacist

## 2016-11-19 NOTE — Patient Outreach (Addendum)
[facility]) Care Management  11/19/2016  AMY USHER December 01, 1940 [id number]   Patient was called regarding medication adherence and management per referral from [place], Orrin Brigham. No answer. HIPAA compliant message was left on the patient's voicemail.  Plan:  I will call the patient back within 3 business days.  Elayne Guerin, PharmD, [place] Clinical Pharmacist [phone]

## 2016-11-23 ENCOUNTER — Other Ambulatory Visit: Payer: Self-pay | Admitting: Pharmacist

## 2016-11-23 NOTE — Patient Outreach (Signed)
[name] Care Management  11/23/2016  Nathan Frank [birth year] [id number]   Patient was called regarding medication management per referral from [place], Orrin Brigham. ( Second Attempt)  No answer. HIPAA identifiers were obtained.  Plan:  I will a third and final attempt to reach patient later this week.  Elayne Guerin, PharmD, [place] Clinical Pharmacist [phone]

## 2016-11-24 ENCOUNTER — Encounter: Payer: Self-pay | Admitting: Pharmacist

## 2016-11-24 ENCOUNTER — Other Ambulatory Visit: Payer: Self-pay | Admitting: Pharmacist

## 2016-11-24 NOTE — Patient Outreach (Signed)
[facility]) Care Management  11/24/2016  MARKEISE MONTVILLE 23-Jun-1941 [id number]   Patient was called regarding medication management following his recent hospitalization. Unfortunately, patient did not answer the call.  A HIPAA compliant message was left on his voicemail.  This was my third attempt to reach the patient.    Plan:  Will send unsuccessful letter to member, if no response in 10 days, will close the case.   Elayne Guerin, PharmD, [place] Clinical Pharmacist [phone]

## 2016-11-30 DIAGNOSIS — I63413 Cerebral infarction due to embolism of bilateral middle cerebral arteries: Secondary | ICD-10-CM | POA: Diagnosis not present

## 2016-12-01 DIAGNOSIS — I5022 Chronic systolic (congestive) heart failure: Secondary | ICD-10-CM | POA: Diagnosis not present

## 2016-12-01 DIAGNOSIS — N390 Urinary tract infection, site not specified: Secondary | ICD-10-CM | POA: Diagnosis not present

## 2016-12-01 DIAGNOSIS — I4891 Unspecified atrial fibrillation: Secondary | ICD-10-CM | POA: Diagnosis not present

## 2016-12-01 DIAGNOSIS — K7460 Unspecified cirrhosis of liver: Secondary | ICD-10-CM | POA: Diagnosis not present

## 2016-12-01 DIAGNOSIS — I69354 Hemiplegia and hemiparesis following cerebral infarction affecting left non-dominant side: Secondary | ICD-10-CM | POA: Diagnosis not present

## 2016-12-01 DIAGNOSIS — E039 Hypothyroidism, unspecified: Secondary | ICD-10-CM | POA: Diagnosis not present

## 2016-12-01 DIAGNOSIS — Z9119 Patient's noncompliance with other medical treatment and regimen: Secondary | ICD-10-CM | POA: Diagnosis not present

## 2016-12-01 DIAGNOSIS — Z9181 History of falling: Secondary | ICD-10-CM | POA: Diagnosis not present

## 2016-12-01 DIAGNOSIS — I429 Cardiomyopathy, unspecified: Secondary | ICD-10-CM | POA: Diagnosis not present

## 2016-12-01 DIAGNOSIS — Z7901 Long term (current) use of anticoagulants: Secondary | ICD-10-CM | POA: Diagnosis not present

## 2016-12-01 DIAGNOSIS — I69393 Ataxia following cerebral infarction: Secondary | ICD-10-CM | POA: Diagnosis not present

## 2016-12-01 DIAGNOSIS — Z86718 Personal history of other venous thrombosis and embolism: Secondary | ICD-10-CM | POA: Diagnosis not present

## 2016-12-01 NOTE — Progress Notes (Deleted)
Cardiology Office Note    Date:  12/01/2016   ID:  Nathan Frank, DOB Jan 26, 1941, MRN [id number]  PCP:  Thressa Sheller, MD  Cardiologist:  Dr. Harrington Challenger  Chief Complaint: Hospital follow up ]for afib RVR  History of Present Illness:   Nathan Frank is a 76 y.o. male PMH significant for atrial fibrillation (on eliquis), liver cirrhosis, Hep C, hx of GI bleed, DVT, alcohol and heroin abuse, hx of stroke with mild left-sided weakness, and nonischemic cardiomyopathy presents for hospital follow up.   He has a history of NSTEMI (04/2015 possibly due to coronary spasm versus embolus or aborted infarct with lysis 04/2015), cardiomyopathy (last Ef: 40-45%), SVT, and paroxysmal atrial fibrillation/flutter. He was hospitalized on 08/09/16 for rapid heart rate and again on 08/12/16 for a mechanical fall and was found to be in Afib RVR. He was last seen in clinic on 08/20/16 and was noted to be noncompliant with his cardiac medications (eliquis, lopressor, and spironolactone). Reviewed with EP for possible [facility], but it was felt he was not a good candidate given his comorbidities and medication noncompliance.  He was sent to ED from clinic for RVR and hypotension (08/20/16 - 08/23/16). He was discharged to a SNF, but later left SNF AMA (09/04/16).   She again brought to ER 11/12/16 after fall at home found to be in atrial fibrillation with rapid ventricular response. Patient wasinitially placedon Cardizem infusion held due to low BP. Mild elevated troponin and EKG felt demand ischemia. EF was stable to 40-45%. RestartedApixabanpost admission(non compliant). Watch for sign of any bleeding given prior hx. Also restarted midodrine.   Here today for follow up.   Past Medical History:  Diagnosis Date  . Abnormal TSH   . Acute gastric ulcer   . Acute gastritis with hemorrhage   . Alcohol abuse   . Alcohol dependence ([place]) 02/02/2014  . Anemia   . Bifascicular block   . Cirrhosis ([place]) 06/04/2012  .  Depressive disorder 02/01/2014  . Dizziness and giddiness 12/13/2014  . DVT of lower limb, acute ([place]) 06/06/2012  . DVT, lower extremity, recurrent ([place])    a. noted 2013. b. also dx 06/2016.  Marland Kitchen Essential hypertension   . Fatty liver   . Gallstones   . Gastritis 06/07/2012  . GERD (gastroesophageal reflux disease)   . GI bleed due to NSAIDs 12/13/2014  . Granulomatous gastritis   . Hematuria 06/04/2012  . Hepatitis C   . Hepatocellular carcinoma ([place])   . Heroin abuse    "I haven't done that since I don't know when."  . Heroin overdose 02/20/2014  . Neuropathy ([place])   . NICM (nonischemic cardiomyopathy) ([place])    a. 04/2015: EF 45-50% by cath. b. EF 40-45% by echo 06/2016.  . NSTEMI (non-ST elevated myocardial infarction) ([place])    a. 04/2015 - patent coronaries. Etiology possibly due to coronary spasm versus embolus, stress cardiomyopathy (atypical), and aborted infarction related to plaque rupture with thrombosis and dissolution. Amlodipine started. Not on antiplatelets due to GIB/cirrhosis history.  . Oral thrush 06/05/2012  . Polysubstance abuse    THC, alcohol, heroin  . Prolonged Q-T interval on ECG    a. 12/2014 - treated with magnesium.  . Right knee pain 12/13/2014  . S/P alcohol detoxification 02/02/2014  . Stroke ([place]) 06/2016  . SVT (supraventricular tachycardia) ([place])    a. 12/2014 in setting of GIB, ETOH, NSAIDS, gastritis.  . Symptomatic cholelithiasis 12/15/2013  . Thrombocytopenia ([place])   . Upper GI  bleeding 12/13/2014  . Weight loss 06/04/2012    Past Surgical History:  Procedure Laterality Date  . CARDIAC CATHETERIZATION N/A 04/15/2015   Procedure: Left Heart Cath and Coronary Angiography;  Surgeon: Belva Crome, MD;  Location: [facility];  Service: Cardiovascular;  Laterality: N/A;  . CIRCUMCISION    . ESOPHAGOGASTRODUODENOSCOPY  06/07/2012   Procedure: ESOPHAGOGASTRODUODENOSCOPY (EGD);  Surgeon: Milus Banister, MD;  Location: [facility];  Service: Endoscopy;   Laterality: N/A;  may need treatment of varices  . ESOPHAGOGASTRODUODENOSCOPY N/A 12/14/2014   Procedure: ESOPHAGOGASTRODUODENOSCOPY (EGD);  Surgeon: Jerene Bears, MD;  Location[facility] END[facility];  Service: Endoscopy;  Laterality: N/A;  . IR GENERIC HISTORICAL  06/25/2016   IR US GUIDE VASC ACCESS LEFT 06/25/2016 Corrie Mckusick, DO [facility]  . IR GENERIC HISTORICAL  06/25/2016   IR RADIOLOGY PERIPHERAL GUIDED IV START 06/25/2016 Corrie Mckusick, DO [facility]  . IR GENERIC HISTORICAL  06/25/2016   IR PERCUTANEOUS ART THROMBECTOMY/INFUSION INTRACRANIAL INC DIAG ANGIO 06/25/2016 Luanne Bras, MD [facility]  . RADIOLOGY WITH ANESTHESIA N/A 06/25/2016   Procedure: RADIOLOGY WITH ANESTHESIA;  Surgeon: Luanne Bras, MD;  Location: [facility];  Service: Radiology;  Laterality: N/A;  . TONSILLECTOMY      Current Medications: Prior to Admission medications   Medication Sig Start Date End Date Taking? Authorizing Provider  apixaban (ELIQUIS) 5 MG TABS tablet Take 1 tablet (5 mg total) by mouth 2 (two) times daily. 11/15/16   Reyne Dumas, MD  folic acid (FOLVITE) 1 MG tablet Take 1 tablet (1 mg total) by mouth daily. Patient not taking: Reported on 11/12/2016 04/21/16   Cristal Ford, DO  levothyroxine (SYNTHROID, LEVOTHROID) 50 MCG tablet Take 1 tablet (50 mcg total) by mouth daily before breakfast. Patient not taking: Reported on 11/12/2016 06/16/16   Florencia Reasons, MD  magnesium oxide (MAG-OX) 400 (241.3 Mg) MG tablet Take 1 tablet (400 mg total) by mouth daily. 11/15/16   Reyne Dumas, MD  metoprolol tartrate (LOPRESSOR) 25 MG tablet Take 0.5 tablets (12.5 mg total) by mouth 2 (two) times daily. 11/15/16 12/15/16  Reyne Dumas, MD  midodrine (PROAMATINE) 10 MG tablet Take 1 tablet (10 mg total) by mouth 3 (three) times daily with meals. 11/15/16 12/15/16  Reyne Dumas, MD  Multiple Vitamin (MULTIVITAMIN WITH MINERALS) TABS tablet Take 1 tablet by mouth daily. 04/21/16   Maryann Mikhail, DO  thiamine 100 MG tablet  Take 1 tablet (100 mg total) by mouth daily. 11/16/16 12/16/16  Reyne Dumas, MD  traMADol (ULTRAM) 50 MG tablet Take 50 mg by mouth 3 (three) times daily as needed for pain. 11/02/16   Historical Provider, MD    Allergies:   Nsaids and Oxycodone   Social History   Social History  . Marital status: Married    Spouse name: N/A  . Number of children: 3  . Years of education: N/A   Occupational History  . Retired Education officer, museum    Social History Main Topics  . Smoking status: Former Smoker    Packs/day: 0.50    Years: 5.00    Types: Cigarettes  . Smokeless tobacco: Never Used     Comment: "quit smoking cigarettes in the 1970's"  . Alcohol use No     Comment: 06/2016 " I quit drinking a couple of weeks ago "  . Drug use: No     Comment: former heroin user-- last used 3 months ago-  . Sexual activity: Not Currently    Birth control/  protection: Condom   Other Topics Concern  . Not on file   Social History Narrative   Lost one son to a gunshot.  Lives with wife.       Family History:  The patient's family history includes Heart disease in his mother; Prostate cancer in his brother; Stroke in his father. ***  ROS:   Please see the history of present illness.    ROS All other systems reviewed and are negative.   PHYSICAL EXAM:   VS:  There were no vitals taken for this visit.   GEN: Well nourished, well developed, in no acute distress  HEENT: normal  Neck: no JVD, carotid bruits, or masses Cardiac: ***RRR; no murmurs, rubs, or gallops,no edema  Respiratory:  clear to auscultation bilaterally, normal work of breathing GI: soft, nontender, nondistended, + BS MS: no deformity or atrophy  Skin: warm and dry, no rash Neuro:  Alert and Oriented x 3, Strength and sensation are intact Psych: euthymic mood, full affect  Wt Readings from Last 3 Encounters:  11/15/16 192 lb 10.9 oz (87.4 kg)  08/23/16 202 lb 4.8 oz (91.8 kg)  08/12/16 198 lb (89.8 kg)      Studies/Labs  Reviewed:   EKG:  EKG is ordered today.  The ekg ordered today demonstrates ***  Recent Labs: 08/20/2016: B Natriuretic Peptide 352.6 11/12/2016: TSH 10.580 11/14/2016: ALT 43; Hemoglobin 15.8; Magnesium 1.9; Platelets 71 11/15/2016: BUN 12; Creatinine, Ser 0.93; Potassium 4.3; Sodium 136   Lipid Panel    Component Value Date/Time   CHOL 87 06/26/2016 0500   TRIG 91 06/27/2016 0219   HDL 26 (L) 06/26/2016 0500   CHOLHDL 3.3 06/26/2016 0500   VLDL 23 06/26/2016 0500   LDLCALC 38 06/26/2016 0500    Additional studies/ records that were reviewed today include:   Echocardiogram 11/13/16 - Left ventricle: The cavity size was normal. Systolic function was mildly to moderately reduced. The estimated ejection fraction was in the range of 40% to 45%. Wall motion was normal; there were no regional wall motion abnormalities. - Mitral valve: There was mild regurgitation. - Left atrium: The atrium was mildly dilated. - Tricuspid valve: There was moderate-severe regurgitation.  Impressions:  - This is a very poor quality study, additional images with echocontrast are recommended. LVEF 40-45%, there is basal and mid inferolateral and anteroseptal hypokinesis. RV poorly visualized, RVEF can&'t be evaluated.  Echocardiogram 06/16/16 - Left ventricle: Posterior , septal and apical hypokinesis The cavity size was mildly dilated. Wall thickness was normal. Systolic function was mildly to moderately reduced. The estimated ejection fraction was in the range of 40% to 45%. Left ventricular diastolic function parameters were normal. - Mitral valve: There was mild regurgitation. - Atrial septum: No defect or patent foramen ovale was identified.  Cardiac catheterization 04/15/2015  Coronary arteries are widely patent. The right coronary, LAD, ramus, and circumflex are very tortuous.  Low normal to mildly depressed LV systolic function with an estimated EF of 45-50%. No  regional wall motion abnormalities are noted.  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  Elevated myocardial markers for injury raises the possibility of coronary spasm, embolus, stress cardiomyopathy (atypical), and aborted infarction related to plaque rupture with thrombosis and dissolution.    ASSESSMENT & PLAN:    1. NICM - EF stable to 40-45% on recent echo. Cardiac catheterization in 2016 showed normal coronary arteries.  2. Persistent atrial fibrillation - Rate     .  Continue Eliquis for anticoagulation,    3. Chronic hypotension - continue Midodrine      Medication Adjustments/Labs and Tests Ordered: Current medicines are reviewed at length with the patient today.  Concerns regarding medicines are outlined above.  Medication changes, Labs and Tests ordered today are listed in the Patient Instructions below. There are no Patient Instructions on file for this visit.   Jarrett Soho, [place]  12/01/2016 12:56 PM    [facility] [place], [place], [place]  [address] Phone: [phone]; Fax: [phone]

## 2016-12-02 ENCOUNTER — Ambulatory Visit: Payer: Self-pay | Admitting: Physician Assistant

## 2016-12-02 DIAGNOSIS — N390 Urinary tract infection, site not specified: Secondary | ICD-10-CM | POA: Diagnosis not present

## 2016-12-02 DIAGNOSIS — Z9119 Patient's noncompliance with other medical treatment and regimen: Secondary | ICD-10-CM | POA: Diagnosis not present

## 2016-12-02 DIAGNOSIS — Z7901 Long term (current) use of anticoagulants: Secondary | ICD-10-CM | POA: Diagnosis not present

## 2016-12-02 DIAGNOSIS — I69354 Hemiplegia and hemiparesis following cerebral infarction affecting left non-dominant side: Secondary | ICD-10-CM | POA: Diagnosis not present

## 2016-12-02 DIAGNOSIS — I429 Cardiomyopathy, unspecified: Secondary | ICD-10-CM | POA: Diagnosis not present

## 2016-12-02 DIAGNOSIS — Z86718 Personal history of other venous thrombosis and embolism: Secondary | ICD-10-CM | POA: Diagnosis not present

## 2016-12-02 DIAGNOSIS — I4891 Unspecified atrial fibrillation: Secondary | ICD-10-CM | POA: Diagnosis not present

## 2016-12-02 DIAGNOSIS — I5022 Chronic systolic (congestive) heart failure: Secondary | ICD-10-CM | POA: Diagnosis not present

## 2016-12-02 DIAGNOSIS — K7460 Unspecified cirrhosis of liver: Secondary | ICD-10-CM | POA: Diagnosis not present

## 2016-12-02 DIAGNOSIS — Z9181 History of falling: Secondary | ICD-10-CM | POA: Diagnosis not present

## 2016-12-02 DIAGNOSIS — I69393 Ataxia following cerebral infarction: Secondary | ICD-10-CM | POA: Diagnosis not present

## 2016-12-02 DIAGNOSIS — E039 Hypothyroidism, unspecified: Secondary | ICD-10-CM | POA: Diagnosis not present

## 2016-12-03 ENCOUNTER — Encounter: Payer: Self-pay | Admitting: Physician Assistant

## 2016-12-03 ENCOUNTER — Encounter: Payer: Self-pay | Admitting: *Deleted

## 2016-12-03 ENCOUNTER — Other Ambulatory Visit: Payer: Self-pay | Admitting: *Deleted

## 2016-12-03 NOTE — Patient Outreach (Signed)
[facility]) Care Management  12/03/2016  Nathan Frank [birth year] [id number]   Outreach letter sent to member on 2/14, no response.  Will close case, will send member and primary MD case closure letters.  [name], [place], MSN [place] [phone]

## 2016-12-05 DIAGNOSIS — Z7901 Long term (current) use of anticoagulants: Secondary | ICD-10-CM | POA: Diagnosis not present

## 2016-12-05 DIAGNOSIS — I69354 Hemiplegia and hemiparesis following cerebral infarction affecting left non-dominant side: Secondary | ICD-10-CM | POA: Diagnosis not present

## 2016-12-05 DIAGNOSIS — Z86718 Personal history of other venous thrombosis and embolism: Secondary | ICD-10-CM | POA: Diagnosis not present

## 2016-12-05 DIAGNOSIS — I69393 Ataxia following cerebral infarction: Secondary | ICD-10-CM | POA: Diagnosis not present

## 2016-12-05 DIAGNOSIS — I429 Cardiomyopathy, unspecified: Secondary | ICD-10-CM | POA: Diagnosis not present

## 2016-12-05 DIAGNOSIS — K7460 Unspecified cirrhosis of liver: Secondary | ICD-10-CM | POA: Diagnosis not present

## 2016-12-05 DIAGNOSIS — I4891 Unspecified atrial fibrillation: Secondary | ICD-10-CM | POA: Diagnosis not present

## 2016-12-05 DIAGNOSIS — Z9181 History of falling: Secondary | ICD-10-CM | POA: Diagnosis not present

## 2016-12-05 DIAGNOSIS — E039 Hypothyroidism, unspecified: Secondary | ICD-10-CM | POA: Diagnosis not present

## 2016-12-05 DIAGNOSIS — I5022 Chronic systolic (congestive) heart failure: Secondary | ICD-10-CM | POA: Diagnosis not present

## 2016-12-05 DIAGNOSIS — N390 Urinary tract infection, site not specified: Secondary | ICD-10-CM | POA: Diagnosis not present

## 2016-12-05 DIAGNOSIS — Z9119 Patient's noncompliance with other medical treatment and regimen: Secondary | ICD-10-CM | POA: Diagnosis not present

## 2016-12-07 DIAGNOSIS — E039 Hypothyroidism, unspecified: Secondary | ICD-10-CM | POA: Diagnosis not present

## 2016-12-07 DIAGNOSIS — K7460 Unspecified cirrhosis of liver: Secondary | ICD-10-CM | POA: Diagnosis not present

## 2016-12-07 DIAGNOSIS — N390 Urinary tract infection, site not specified: Secondary | ICD-10-CM | POA: Diagnosis not present

## 2016-12-07 DIAGNOSIS — Z7901 Long term (current) use of anticoagulants: Secondary | ICD-10-CM | POA: Diagnosis not present

## 2016-12-07 DIAGNOSIS — I69393 Ataxia following cerebral infarction: Secondary | ICD-10-CM | POA: Diagnosis not present

## 2016-12-07 DIAGNOSIS — I69354 Hemiplegia and hemiparesis following cerebral infarction affecting left non-dominant side: Secondary | ICD-10-CM | POA: Diagnosis not present

## 2016-12-07 DIAGNOSIS — I4891 Unspecified atrial fibrillation: Secondary | ICD-10-CM | POA: Diagnosis not present

## 2016-12-07 DIAGNOSIS — Z9181 History of falling: Secondary | ICD-10-CM | POA: Diagnosis not present

## 2016-12-07 DIAGNOSIS — Z86718 Personal history of other venous thrombosis and embolism: Secondary | ICD-10-CM | POA: Diagnosis not present

## 2016-12-07 DIAGNOSIS — I429 Cardiomyopathy, unspecified: Secondary | ICD-10-CM | POA: Diagnosis not present

## 2016-12-07 DIAGNOSIS — Z9119 Patient's noncompliance with other medical treatment and regimen: Secondary | ICD-10-CM | POA: Diagnosis not present

## 2016-12-07 DIAGNOSIS — I5022 Chronic systolic (congestive) heart failure: Secondary | ICD-10-CM | POA: Diagnosis not present

## 2016-12-08 DIAGNOSIS — K7460 Unspecified cirrhosis of liver: Secondary | ICD-10-CM | POA: Diagnosis not present

## 2016-12-08 DIAGNOSIS — Z7901 Long term (current) use of anticoagulants: Secondary | ICD-10-CM | POA: Diagnosis not present

## 2016-12-08 DIAGNOSIS — N390 Urinary tract infection, site not specified: Secondary | ICD-10-CM | POA: Diagnosis not present

## 2016-12-08 DIAGNOSIS — Z9119 Patient's noncompliance with other medical treatment and regimen: Secondary | ICD-10-CM | POA: Diagnosis not present

## 2016-12-08 DIAGNOSIS — E039 Hypothyroidism, unspecified: Secondary | ICD-10-CM | POA: Diagnosis not present

## 2016-12-08 DIAGNOSIS — I4891 Unspecified atrial fibrillation: Secondary | ICD-10-CM | POA: Diagnosis not present

## 2016-12-08 DIAGNOSIS — I429 Cardiomyopathy, unspecified: Secondary | ICD-10-CM | POA: Diagnosis not present

## 2016-12-08 DIAGNOSIS — Z86718 Personal history of other venous thrombosis and embolism: Secondary | ICD-10-CM | POA: Diagnosis not present

## 2016-12-08 DIAGNOSIS — Z9181 History of falling: Secondary | ICD-10-CM | POA: Diagnosis not present

## 2016-12-08 DIAGNOSIS — I69354 Hemiplegia and hemiparesis following cerebral infarction affecting left non-dominant side: Secondary | ICD-10-CM | POA: Diagnosis not present

## 2016-12-08 DIAGNOSIS — I69393 Ataxia following cerebral infarction: Secondary | ICD-10-CM | POA: Diagnosis not present

## 2016-12-08 DIAGNOSIS — I5022 Chronic systolic (congestive) heart failure: Secondary | ICD-10-CM | POA: Diagnosis not present

## 2016-12-09 DIAGNOSIS — Z86718 Personal history of other venous thrombosis and embolism: Secondary | ICD-10-CM | POA: Diagnosis not present

## 2016-12-09 DIAGNOSIS — Z9119 Patient's noncompliance with other medical treatment and regimen: Secondary | ICD-10-CM | POA: Diagnosis not present

## 2016-12-09 DIAGNOSIS — I5022 Chronic systolic (congestive) heart failure: Secondary | ICD-10-CM | POA: Diagnosis not present

## 2016-12-09 DIAGNOSIS — I429 Cardiomyopathy, unspecified: Secondary | ICD-10-CM | POA: Diagnosis not present

## 2016-12-09 DIAGNOSIS — Z7901 Long term (current) use of anticoagulants: Secondary | ICD-10-CM | POA: Diagnosis not present

## 2016-12-09 DIAGNOSIS — Z9181 History of falling: Secondary | ICD-10-CM | POA: Diagnosis not present

## 2016-12-09 DIAGNOSIS — I4891 Unspecified atrial fibrillation: Secondary | ICD-10-CM | POA: Diagnosis not present

## 2016-12-09 DIAGNOSIS — K7460 Unspecified cirrhosis of liver: Secondary | ICD-10-CM | POA: Diagnosis not present

## 2016-12-09 DIAGNOSIS — E039 Hypothyroidism, unspecified: Secondary | ICD-10-CM | POA: Diagnosis not present

## 2016-12-09 DIAGNOSIS — I69354 Hemiplegia and hemiparesis following cerebral infarction affecting left non-dominant side: Secondary | ICD-10-CM | POA: Diagnosis not present

## 2016-12-09 DIAGNOSIS — N390 Urinary tract infection, site not specified: Secondary | ICD-10-CM | POA: Diagnosis not present

## 2016-12-09 DIAGNOSIS — I69393 Ataxia following cerebral infarction: Secondary | ICD-10-CM | POA: Diagnosis not present

## 2016-12-10 DIAGNOSIS — Z9181 History of falling: Secondary | ICD-10-CM | POA: Diagnosis not present

## 2016-12-10 DIAGNOSIS — Z9119 Patient's noncompliance with other medical treatment and regimen: Secondary | ICD-10-CM | POA: Diagnosis not present

## 2016-12-10 DIAGNOSIS — N390 Urinary tract infection, site not specified: Secondary | ICD-10-CM | POA: Diagnosis not present

## 2016-12-10 DIAGNOSIS — I4891 Unspecified atrial fibrillation: Secondary | ICD-10-CM | POA: Diagnosis not present

## 2016-12-10 DIAGNOSIS — I429 Cardiomyopathy, unspecified: Secondary | ICD-10-CM | POA: Diagnosis not present

## 2016-12-10 DIAGNOSIS — E039 Hypothyroidism, unspecified: Secondary | ICD-10-CM | POA: Diagnosis not present

## 2016-12-10 DIAGNOSIS — I69354 Hemiplegia and hemiparesis following cerebral infarction affecting left non-dominant side: Secondary | ICD-10-CM | POA: Diagnosis not present

## 2016-12-10 DIAGNOSIS — K7460 Unspecified cirrhosis of liver: Secondary | ICD-10-CM | POA: Diagnosis not present

## 2016-12-10 DIAGNOSIS — I5022 Chronic systolic (congestive) heart failure: Secondary | ICD-10-CM | POA: Diagnosis not present

## 2016-12-10 DIAGNOSIS — I69393 Ataxia following cerebral infarction: Secondary | ICD-10-CM | POA: Diagnosis not present

## 2016-12-10 DIAGNOSIS — Z86718 Personal history of other venous thrombosis and embolism: Secondary | ICD-10-CM | POA: Diagnosis not present

## 2016-12-10 DIAGNOSIS — Z7901 Long term (current) use of anticoagulants: Secondary | ICD-10-CM | POA: Diagnosis not present

## 2016-12-14 DIAGNOSIS — I429 Cardiomyopathy, unspecified: Secondary | ICD-10-CM | POA: Diagnosis not present

## 2016-12-14 DIAGNOSIS — Z7901 Long term (current) use of anticoagulants: Secondary | ICD-10-CM | POA: Diagnosis not present

## 2016-12-14 DIAGNOSIS — K7460 Unspecified cirrhosis of liver: Secondary | ICD-10-CM | POA: Diagnosis not present

## 2016-12-14 DIAGNOSIS — I69354 Hemiplegia and hemiparesis following cerebral infarction affecting left non-dominant side: Secondary | ICD-10-CM | POA: Diagnosis not present

## 2016-12-14 DIAGNOSIS — N390 Urinary tract infection, site not specified: Secondary | ICD-10-CM | POA: Diagnosis not present

## 2016-12-14 DIAGNOSIS — Z9119 Patient's noncompliance with other medical treatment and regimen: Secondary | ICD-10-CM | POA: Diagnosis not present

## 2016-12-14 DIAGNOSIS — Z86718 Personal history of other venous thrombosis and embolism: Secondary | ICD-10-CM | POA: Diagnosis not present

## 2016-12-14 DIAGNOSIS — E039 Hypothyroidism, unspecified: Secondary | ICD-10-CM | POA: Diagnosis not present

## 2016-12-14 DIAGNOSIS — Z9181 History of falling: Secondary | ICD-10-CM | POA: Diagnosis not present

## 2016-12-14 DIAGNOSIS — I5022 Chronic systolic (congestive) heart failure: Secondary | ICD-10-CM | POA: Diagnosis not present

## 2016-12-14 DIAGNOSIS — I69393 Ataxia following cerebral infarction: Secondary | ICD-10-CM | POA: Diagnosis not present

## 2016-12-14 DIAGNOSIS — I4891 Unspecified atrial fibrillation: Secondary | ICD-10-CM | POA: Diagnosis not present

## 2016-12-15 DIAGNOSIS — I69393 Ataxia following cerebral infarction: Secondary | ICD-10-CM | POA: Diagnosis not present

## 2016-12-15 DIAGNOSIS — I69354 Hemiplegia and hemiparesis following cerebral infarction affecting left non-dominant side: Secondary | ICD-10-CM | POA: Diagnosis not present

## 2016-12-15 DIAGNOSIS — Z9119 Patient's noncompliance with other medical treatment and regimen: Secondary | ICD-10-CM | POA: Diagnosis not present

## 2016-12-15 DIAGNOSIS — Z86718 Personal history of other venous thrombosis and embolism: Secondary | ICD-10-CM | POA: Diagnosis not present

## 2016-12-15 DIAGNOSIS — I429 Cardiomyopathy, unspecified: Secondary | ICD-10-CM | POA: Diagnosis not present

## 2016-12-15 DIAGNOSIS — Z7901 Long term (current) use of anticoagulants: Secondary | ICD-10-CM | POA: Diagnosis not present

## 2016-12-15 DIAGNOSIS — I4891 Unspecified atrial fibrillation: Secondary | ICD-10-CM | POA: Diagnosis not present

## 2016-12-15 DIAGNOSIS — N390 Urinary tract infection, site not specified: Secondary | ICD-10-CM | POA: Diagnosis not present

## 2016-12-15 DIAGNOSIS — E039 Hypothyroidism, unspecified: Secondary | ICD-10-CM | POA: Diagnosis not present

## 2016-12-15 DIAGNOSIS — K7460 Unspecified cirrhosis of liver: Secondary | ICD-10-CM | POA: Diagnosis not present

## 2016-12-15 DIAGNOSIS — Z9181 History of falling: Secondary | ICD-10-CM | POA: Diagnosis not present

## 2016-12-15 DIAGNOSIS — I5022 Chronic systolic (congestive) heart failure: Secondary | ICD-10-CM | POA: Diagnosis not present

## 2016-12-16 DIAGNOSIS — E039 Hypothyroidism, unspecified: Secondary | ICD-10-CM | POA: Diagnosis not present

## 2016-12-16 DIAGNOSIS — Z7901 Long term (current) use of anticoagulants: Secondary | ICD-10-CM | POA: Diagnosis not present

## 2016-12-16 DIAGNOSIS — Z9181 History of falling: Secondary | ICD-10-CM | POA: Diagnosis not present

## 2016-12-16 DIAGNOSIS — N390 Urinary tract infection, site not specified: Secondary | ICD-10-CM | POA: Diagnosis not present

## 2016-12-16 DIAGNOSIS — Z9119 Patient's noncompliance with other medical treatment and regimen: Secondary | ICD-10-CM | POA: Diagnosis not present

## 2016-12-16 DIAGNOSIS — I429 Cardiomyopathy, unspecified: Secondary | ICD-10-CM | POA: Diagnosis not present

## 2016-12-16 DIAGNOSIS — I4891 Unspecified atrial fibrillation: Secondary | ICD-10-CM | POA: Diagnosis not present

## 2016-12-16 DIAGNOSIS — I5022 Chronic systolic (congestive) heart failure: Secondary | ICD-10-CM | POA: Diagnosis not present

## 2016-12-16 DIAGNOSIS — K7460 Unspecified cirrhosis of liver: Secondary | ICD-10-CM | POA: Diagnosis not present

## 2016-12-16 DIAGNOSIS — I69354 Hemiplegia and hemiparesis following cerebral infarction affecting left non-dominant side: Secondary | ICD-10-CM | POA: Diagnosis not present

## 2016-12-16 DIAGNOSIS — I69393 Ataxia following cerebral infarction: Secondary | ICD-10-CM | POA: Diagnosis not present

## 2016-12-16 DIAGNOSIS — Z86718 Personal history of other venous thrombosis and embolism: Secondary | ICD-10-CM | POA: Diagnosis not present

## 2016-12-18 DIAGNOSIS — I429 Cardiomyopathy, unspecified: Secondary | ICD-10-CM | POA: Diagnosis not present

## 2016-12-18 DIAGNOSIS — Z9181 History of falling: Secondary | ICD-10-CM | POA: Diagnosis not present

## 2016-12-18 DIAGNOSIS — I5022 Chronic systolic (congestive) heart failure: Secondary | ICD-10-CM | POA: Diagnosis not present

## 2016-12-18 DIAGNOSIS — Z7901 Long term (current) use of anticoagulants: Secondary | ICD-10-CM | POA: Diagnosis not present

## 2016-12-18 DIAGNOSIS — Z86718 Personal history of other venous thrombosis and embolism: Secondary | ICD-10-CM | POA: Diagnosis not present

## 2016-12-18 DIAGNOSIS — N390 Urinary tract infection, site not specified: Secondary | ICD-10-CM | POA: Diagnosis not present

## 2016-12-18 DIAGNOSIS — E039 Hypothyroidism, unspecified: Secondary | ICD-10-CM | POA: Diagnosis not present

## 2016-12-18 DIAGNOSIS — Z9119 Patient's noncompliance with other medical treatment and regimen: Secondary | ICD-10-CM | POA: Diagnosis not present

## 2016-12-18 DIAGNOSIS — I69393 Ataxia following cerebral infarction: Secondary | ICD-10-CM | POA: Diagnosis not present

## 2016-12-18 DIAGNOSIS — I69354 Hemiplegia and hemiparesis following cerebral infarction affecting left non-dominant side: Secondary | ICD-10-CM | POA: Diagnosis not present

## 2016-12-18 DIAGNOSIS — K7460 Unspecified cirrhosis of liver: Secondary | ICD-10-CM | POA: Diagnosis not present

## 2016-12-18 DIAGNOSIS — I4891 Unspecified atrial fibrillation: Secondary | ICD-10-CM | POA: Diagnosis not present

## 2016-12-21 DIAGNOSIS — E039 Hypothyroidism, unspecified: Secondary | ICD-10-CM | POA: Diagnosis not present

## 2016-12-21 DIAGNOSIS — Z7901 Long term (current) use of anticoagulants: Secondary | ICD-10-CM | POA: Diagnosis not present

## 2016-12-21 DIAGNOSIS — Z9119 Patient's noncompliance with other medical treatment and regimen: Secondary | ICD-10-CM | POA: Diagnosis not present

## 2016-12-21 DIAGNOSIS — I5022 Chronic systolic (congestive) heart failure: Secondary | ICD-10-CM | POA: Diagnosis not present

## 2016-12-21 DIAGNOSIS — I4891 Unspecified atrial fibrillation: Secondary | ICD-10-CM | POA: Diagnosis not present

## 2016-12-21 DIAGNOSIS — Z9181 History of falling: Secondary | ICD-10-CM | POA: Diagnosis not present

## 2016-12-21 DIAGNOSIS — I69354 Hemiplegia and hemiparesis following cerebral infarction affecting left non-dominant side: Secondary | ICD-10-CM | POA: Diagnosis not present

## 2016-12-21 DIAGNOSIS — N390 Urinary tract infection, site not specified: Secondary | ICD-10-CM | POA: Diagnosis not present

## 2016-12-21 DIAGNOSIS — Z86718 Personal history of other venous thrombosis and embolism: Secondary | ICD-10-CM | POA: Diagnosis not present

## 2016-12-21 DIAGNOSIS — K7460 Unspecified cirrhosis of liver: Secondary | ICD-10-CM | POA: Diagnosis not present

## 2016-12-21 DIAGNOSIS — I69393 Ataxia following cerebral infarction: Secondary | ICD-10-CM | POA: Diagnosis not present

## 2016-12-21 DIAGNOSIS — I429 Cardiomyopathy, unspecified: Secondary | ICD-10-CM | POA: Diagnosis not present

## 2016-12-22 DIAGNOSIS — I5022 Chronic systolic (congestive) heart failure: Secondary | ICD-10-CM | POA: Diagnosis not present

## 2016-12-22 DIAGNOSIS — Z7901 Long term (current) use of anticoagulants: Secondary | ICD-10-CM | POA: Diagnosis not present

## 2016-12-22 DIAGNOSIS — I429 Cardiomyopathy, unspecified: Secondary | ICD-10-CM | POA: Diagnosis not present

## 2016-12-22 DIAGNOSIS — Z86718 Personal history of other venous thrombosis and embolism: Secondary | ICD-10-CM | POA: Diagnosis not present

## 2016-12-22 DIAGNOSIS — Z9181 History of falling: Secondary | ICD-10-CM | POA: Diagnosis not present

## 2016-12-22 DIAGNOSIS — I69393 Ataxia following cerebral infarction: Secondary | ICD-10-CM | POA: Diagnosis not present

## 2016-12-22 DIAGNOSIS — I4891 Unspecified atrial fibrillation: Secondary | ICD-10-CM | POA: Diagnosis not present

## 2016-12-22 DIAGNOSIS — E039 Hypothyroidism, unspecified: Secondary | ICD-10-CM | POA: Diagnosis not present

## 2016-12-22 DIAGNOSIS — Z9119 Patient's noncompliance with other medical treatment and regimen: Secondary | ICD-10-CM | POA: Diagnosis not present

## 2016-12-22 DIAGNOSIS — N390 Urinary tract infection, site not specified: Secondary | ICD-10-CM | POA: Diagnosis not present

## 2016-12-22 DIAGNOSIS — I69354 Hemiplegia and hemiparesis following cerebral infarction affecting left non-dominant side: Secondary | ICD-10-CM | POA: Diagnosis not present

## 2016-12-22 DIAGNOSIS — K7460 Unspecified cirrhosis of liver: Secondary | ICD-10-CM | POA: Diagnosis not present

## 2016-12-23 ENCOUNTER — Other Ambulatory Visit: Payer: Self-pay | Admitting: Gastroenterology

## 2016-12-23 DIAGNOSIS — I429 Cardiomyopathy, unspecified: Secondary | ICD-10-CM | POA: Diagnosis not present

## 2016-12-23 DIAGNOSIS — B182 Chronic viral hepatitis C: Secondary | ICD-10-CM

## 2016-12-23 DIAGNOSIS — I5022 Chronic systolic (congestive) heart failure: Secondary | ICD-10-CM | POA: Diagnosis not present

## 2016-12-23 DIAGNOSIS — K7460 Unspecified cirrhosis of liver: Secondary | ICD-10-CM | POA: Diagnosis not present

## 2016-12-23 DIAGNOSIS — Z9119 Patient's noncompliance with other medical treatment and regimen: Secondary | ICD-10-CM | POA: Diagnosis not present

## 2016-12-23 DIAGNOSIS — Z86718 Personal history of other venous thrombosis and embolism: Secondary | ICD-10-CM | POA: Diagnosis not present

## 2016-12-23 DIAGNOSIS — I4891 Unspecified atrial fibrillation: Secondary | ICD-10-CM | POA: Diagnosis not present

## 2016-12-23 DIAGNOSIS — Z9181 History of falling: Secondary | ICD-10-CM | POA: Diagnosis not present

## 2016-12-23 DIAGNOSIS — D698 Other specified hemorrhagic conditions: Secondary | ICD-10-CM | POA: Diagnosis not present

## 2016-12-23 DIAGNOSIS — E039 Hypothyroidism, unspecified: Secondary | ICD-10-CM | POA: Diagnosis not present

## 2016-12-23 DIAGNOSIS — Z7901 Long term (current) use of anticoagulants: Secondary | ICD-10-CM | POA: Diagnosis not present

## 2016-12-23 DIAGNOSIS — I69354 Hemiplegia and hemiparesis following cerebral infarction affecting left non-dominant side: Secondary | ICD-10-CM | POA: Diagnosis not present

## 2016-12-23 DIAGNOSIS — N390 Urinary tract infection, site not specified: Secondary | ICD-10-CM | POA: Diagnosis not present

## 2016-12-23 DIAGNOSIS — I69393 Ataxia following cerebral infarction: Secondary | ICD-10-CM | POA: Diagnosis not present

## 2016-12-24 DIAGNOSIS — Z7901 Long term (current) use of anticoagulants: Secondary | ICD-10-CM | POA: Diagnosis not present

## 2016-12-24 DIAGNOSIS — K7460 Unspecified cirrhosis of liver: Secondary | ICD-10-CM | POA: Diagnosis not present

## 2016-12-24 DIAGNOSIS — Z86718 Personal history of other venous thrombosis and embolism: Secondary | ICD-10-CM | POA: Diagnosis not present

## 2016-12-24 DIAGNOSIS — I69354 Hemiplegia and hemiparesis following cerebral infarction affecting left non-dominant side: Secondary | ICD-10-CM | POA: Diagnosis not present

## 2016-12-24 DIAGNOSIS — N390 Urinary tract infection, site not specified: Secondary | ICD-10-CM | POA: Diagnosis not present

## 2016-12-24 DIAGNOSIS — Z9119 Patient's noncompliance with other medical treatment and regimen: Secondary | ICD-10-CM | POA: Diagnosis not present

## 2016-12-24 DIAGNOSIS — I5022 Chronic systolic (congestive) heart failure: Secondary | ICD-10-CM | POA: Diagnosis not present

## 2016-12-24 DIAGNOSIS — Z9181 History of falling: Secondary | ICD-10-CM | POA: Diagnosis not present

## 2016-12-24 DIAGNOSIS — I69393 Ataxia following cerebral infarction: Secondary | ICD-10-CM | POA: Diagnosis not present

## 2016-12-24 DIAGNOSIS — I429 Cardiomyopathy, unspecified: Secondary | ICD-10-CM | POA: Diagnosis not present

## 2016-12-24 DIAGNOSIS — E039 Hypothyroidism, unspecified: Secondary | ICD-10-CM | POA: Diagnosis not present

## 2016-12-24 DIAGNOSIS — I4891 Unspecified atrial fibrillation: Secondary | ICD-10-CM | POA: Diagnosis not present

## 2016-12-25 DIAGNOSIS — Z7901 Long term (current) use of anticoagulants: Secondary | ICD-10-CM | POA: Diagnosis not present

## 2016-12-25 DIAGNOSIS — I4891 Unspecified atrial fibrillation: Secondary | ICD-10-CM | POA: Diagnosis not present

## 2016-12-25 DIAGNOSIS — I5022 Chronic systolic (congestive) heart failure: Secondary | ICD-10-CM | POA: Diagnosis not present

## 2016-12-25 DIAGNOSIS — I429 Cardiomyopathy, unspecified: Secondary | ICD-10-CM | POA: Diagnosis not present

## 2016-12-25 DIAGNOSIS — Z9119 Patient's noncompliance with other medical treatment and regimen: Secondary | ICD-10-CM | POA: Diagnosis not present

## 2016-12-25 DIAGNOSIS — K7460 Unspecified cirrhosis of liver: Secondary | ICD-10-CM | POA: Diagnosis not present

## 2016-12-25 DIAGNOSIS — Z9181 History of falling: Secondary | ICD-10-CM | POA: Diagnosis not present

## 2016-12-25 DIAGNOSIS — I69354 Hemiplegia and hemiparesis following cerebral infarction affecting left non-dominant side: Secondary | ICD-10-CM | POA: Diagnosis not present

## 2016-12-25 DIAGNOSIS — Z86718 Personal history of other venous thrombosis and embolism: Secondary | ICD-10-CM | POA: Diagnosis not present

## 2016-12-25 DIAGNOSIS — N390 Urinary tract infection, site not specified: Secondary | ICD-10-CM | POA: Diagnosis not present

## 2016-12-25 DIAGNOSIS — I69393 Ataxia following cerebral infarction: Secondary | ICD-10-CM | POA: Diagnosis not present

## 2016-12-25 DIAGNOSIS — E039 Hypothyroidism, unspecified: Secondary | ICD-10-CM | POA: Diagnosis not present

## 2016-12-28 DIAGNOSIS — Z7901 Long term (current) use of anticoagulants: Secondary | ICD-10-CM | POA: Diagnosis not present

## 2016-12-28 DIAGNOSIS — K7460 Unspecified cirrhosis of liver: Secondary | ICD-10-CM | POA: Diagnosis not present

## 2016-12-28 DIAGNOSIS — I63413 Cerebral infarction due to embolism of bilateral middle cerebral arteries: Secondary | ICD-10-CM | POA: Diagnosis not present

## 2016-12-28 DIAGNOSIS — I429 Cardiomyopathy, unspecified: Secondary | ICD-10-CM | POA: Diagnosis not present

## 2016-12-28 DIAGNOSIS — E039 Hypothyroidism, unspecified: Secondary | ICD-10-CM | POA: Diagnosis not present

## 2016-12-28 DIAGNOSIS — Z86718 Personal history of other venous thrombosis and embolism: Secondary | ICD-10-CM | POA: Diagnosis not present

## 2016-12-28 DIAGNOSIS — Z9119 Patient's noncompliance with other medical treatment and regimen: Secondary | ICD-10-CM | POA: Diagnosis not present

## 2016-12-28 DIAGNOSIS — I69393 Ataxia following cerebral infarction: Secondary | ICD-10-CM | POA: Diagnosis not present

## 2016-12-28 DIAGNOSIS — I5022 Chronic systolic (congestive) heart failure: Secondary | ICD-10-CM | POA: Diagnosis not present

## 2016-12-28 DIAGNOSIS — I69354 Hemiplegia and hemiparesis following cerebral infarction affecting left non-dominant side: Secondary | ICD-10-CM | POA: Diagnosis not present

## 2016-12-28 DIAGNOSIS — Z9181 History of falling: Secondary | ICD-10-CM | POA: Diagnosis not present

## 2016-12-28 DIAGNOSIS — N390 Urinary tract infection, site not specified: Secondary | ICD-10-CM | POA: Diagnosis not present

## 2016-12-28 DIAGNOSIS — I4891 Unspecified atrial fibrillation: Secondary | ICD-10-CM | POA: Diagnosis not present

## 2016-12-29 ENCOUNTER — Ambulatory Visit (HOSPITAL_COMMUNITY): Payer: Medicare Other

## 2016-12-29 ENCOUNTER — Encounter (HOSPITAL_COMMUNITY): Payer: Self-pay

## 2016-12-30 DIAGNOSIS — N390 Urinary tract infection, site not specified: Secondary | ICD-10-CM | POA: Diagnosis not present

## 2016-12-30 DIAGNOSIS — I5022 Chronic systolic (congestive) heart failure: Secondary | ICD-10-CM | POA: Diagnosis not present

## 2016-12-30 DIAGNOSIS — Z7901 Long term (current) use of anticoagulants: Secondary | ICD-10-CM | POA: Diagnosis not present

## 2016-12-30 DIAGNOSIS — Z86718 Personal history of other venous thrombosis and embolism: Secondary | ICD-10-CM | POA: Diagnosis not present

## 2016-12-30 DIAGNOSIS — I429 Cardiomyopathy, unspecified: Secondary | ICD-10-CM | POA: Diagnosis not present

## 2016-12-30 DIAGNOSIS — I69393 Ataxia following cerebral infarction: Secondary | ICD-10-CM | POA: Diagnosis not present

## 2016-12-30 DIAGNOSIS — Z9181 History of falling: Secondary | ICD-10-CM | POA: Diagnosis not present

## 2016-12-30 DIAGNOSIS — E039 Hypothyroidism, unspecified: Secondary | ICD-10-CM | POA: Diagnosis not present

## 2016-12-30 DIAGNOSIS — I69354 Hemiplegia and hemiparesis following cerebral infarction affecting left non-dominant side: Secondary | ICD-10-CM | POA: Diagnosis not present

## 2016-12-30 DIAGNOSIS — Z9119 Patient's noncompliance with other medical treatment and regimen: Secondary | ICD-10-CM | POA: Diagnosis not present

## 2016-12-30 DIAGNOSIS — K7460 Unspecified cirrhosis of liver: Secondary | ICD-10-CM | POA: Diagnosis not present

## 2016-12-30 DIAGNOSIS — I4891 Unspecified atrial fibrillation: Secondary | ICD-10-CM | POA: Diagnosis not present

## 2016-12-31 NOTE — Progress Notes (Signed)
Cardiology Office Note   Date:  01/01/2017   ID:  Nathan Frank, DOB 18-Sep-1941, MRN [id number]  PCP:  Thressa Sheller, MD  Cardiologist:   [name], MD   F/U of NICM and atrial fib      History of Present Illness: Nathan Frank is a 76 y.o. male with a history of atrial fib, hep C with cirrhosis, EtOH and heroin use, CVA, NICM    LVEF 40 to 45%   Cath in 2016 Normal coronary arteries   He was recently in hospital with afib with RVR , noncompliance with meds   Son comes in today and says it was because he was drinking   Since he has been home he has not been dizzy  Breathing is OK  He sleeps a lot No palpitations Son makes sure he is weighed every am  Wt 180 at home Stable     Current Meds  Medication Sig  . apixaban (ELIQUIS) 5 MG TABS tablet Take 1 tablet (5 mg total) by mouth 2 (two) times daily.  . folic acid (FOLVITE) 1 MG tablet Take 1 mg by mouth daily.  Marland Kitchen levothyroxine (SYNTHROID, LEVOTHROID) 50 MCG tablet Take 1 tablet by mouth daily before breakfast.  . magnesium oxide (MAG-OX) 400 (241.3 Mg) MG tablet Take 1 tablet (400 mg total) by mouth daily.  . Melatonin 5 MG CAPS Take 1 capsule by mouth at bedtime.  . midodrine (PROAMATINE) 10 MG tablet Take 10 mg by mouth 3 (three) times daily.  . [DISCONTINUED] Magnesium 400 MG CAPS Take 1 tablet by mouth 2 (two) times daily.  Metoprolol 12.5 bid     Allergies:   Nsaids and Oxycodone   Past Medical History:  Diagnosis Date  . Abnormal TSH   . Acute gastric ulcer   . Acute gastritis with hemorrhage   . Alcohol abuse   . Alcohol dependence ([place]) 02/02/2014  . Anemia   . Bifascicular block   . Cirrhosis ([place]) 06/04/2012  . Depressive disorder 02/01/2014  . Dizziness and giddiness 12/13/2014  . DVT of lower limb, acute ([place]) 06/06/2012  . DVT, lower extremity, recurrent ([place])    a. noted 2013. b. also dx 06/2016.  Marland Kitchen Essential hypertension   . Fatty liver   . Gallstones   . Gastritis 06/07/2012  . GERD  (gastroesophageal reflux disease)   . GI bleed due to NSAIDs 12/13/2014  . Granulomatous gastritis   . Hematuria 06/04/2012  . Hepatitis C   . Hepatocellular carcinoma ([place])   . Heroin abuse    "I haven't done that since I don't know when."  . Heroin overdose 02/20/2014  . Neuropathy ([place])   . NICM (nonischemic cardiomyopathy) ([place])    a. 04/2015: EF 45-50% by cath. b. EF 40-45% by echo 06/2016.  . NSTEMI (non-ST elevated myocardial infarction) ([place])    a. 04/2015 - patent coronaries. Etiology possibly due to coronary spasm versus embolus, stress cardiomyopathy (atypical), and aborted infarction related to plaque rupture with thrombosis and dissolution. Amlodipine started. Not on antiplatelets due to GIB/cirrhosis history.  . Oral thrush 06/05/2012  . Polysubstance abuse    THC, alcohol, heroin  . Prolonged Q-T interval on ECG    a. 12/2014 - treated with magnesium.  . Right knee pain 12/13/2014  . S/P alcohol detoxification 02/02/2014  . Stroke ([place]) 06/2016  . SVT (supraventricular tachycardia) ([place])    a. 12/2014 in setting of GIB, ETOH, NSAIDS, gastritis.  . Symptomatic cholelithiasis 12/15/2013  .  Thrombocytopenia ([place])   . Upper GI bleeding 12/13/2014  . Weight loss 06/04/2012    Past Surgical History:  Procedure Laterality Date  . CARDIAC CATHETERIZATION N/A 04/15/2015   Procedure: Left Heart Cath and Coronary Angiography;  Surgeon: Belva Crome, MD;  Location: [facility];  Service: Cardiovascular;  Laterality: N/A;  . CIRCUMCISION    . ESOPHAGOGASTRODUODENOSCOPY  06/07/2012   Procedure: ESOPHAGOGASTRODUODENOSCOPY (EGD);  Surgeon: Milus Banister, MD;  Location: [facility];  Service: Endoscopy;  Laterality: N/A;  may need treatment of varices  . ESOPHAGOGASTRODUODENOSCOPY N/A 12/14/2014   Procedure: ESOPHAGOGASTRODUODENOSCOPY (EGD);  Surgeon: Jerene Bears, MD;  Location: [facility] ENDOSCOPY;  Service: Endoscopy;  Laterality: N/A;  . IR GENERIC HISTORICAL  06/25/2016   IR US GUIDE VASC  ACCESS LEFT 06/25/2016 Corrie Mckusick, DO [facility]  . IR GENERIC HISTORICAL  06/25/2016   IR RADIOLOGY PERIPHERAL GUIDED IV START 06/25/2016 Corrie Mckusick, DO [facility]  . IR GENERIC HISTORICAL  06/25/2016   IR PERCUTANEOUS ART THROMBECTOMY/INFUSION INTRACRANIAL INC DIAG ANGIO 06/25/2016 Luanne Bras, MD [facility]  . RADIOLOGY WITH ANESTHESIA N/A 06/25/2016   Procedure: RADIOLOGY WITH ANESTHESIA;  Surgeon: Luanne Bras, MD;  Location: [facility];  Service: Radiology;  Laterality: N/A;  . TONSILLECTOMY       Social History:  The patient  reports that he has quit smoking. His smoking use included Cigarettes. He has a 2.50 pack-year smoking history. He has never used smokeless tobacco. He reports that he does not drink alcohol or use drugs.   Family History:  The patient's family history includes Heart disease in his mother; Prostate cancer in his brother; Stroke in his father.    ROS:  Please see the history of present illness. All other systems are reviewed and  Negative to the above problem except as noted.    PHYSICAL EXAM: VS:  BP 122/82   Pulse 88   Ht 5\' 9"  (1.753 m)   Wt 191 lb 3.2 oz (86.7 kg)   SpO2 99%   BMI 28.24 kg/m   GEN: Well nourished, well developed, in no acute distress  HEENT: normal  Neck: no JVD, carotid bruits, or masses Cardiac: RRR; no murmurs, rubs, or gallops,Tr  edema  Respiratory:  clear to auscultation bilaterally, normal work of breathing GI: soft, nontender, nondistended, + BS  No hepatomegaly  MS: no deformity Moving all extremities   Skin: warm and dry, no rash Neuro:  Strength and sensation are intact Psych: euthymic mood, full affect   EKG:  EKG is not ordered today.   Lipid Panel    Component Value Date/Time   CHOL 87 06/26/2016 0500   TRIG 91 06/27/2016 0219   HDL 26 (L) 06/26/2016 0500   CHOLHDL 3.3 06/26/2016 0500   VLDL 23 06/26/2016 0500   LDLCALC 38 06/26/2016 0500      Wt Readings from Last 3 Encounters:    01/01/17 191 lb 3.2 oz (86.7 kg)  11/15/16 192 lb 10.9 oz (87.4 kg)  08/23/16 202 lb 4.8 oz (91.8 kg)      ASSESSMENT AND PLAN: 1  Chronic systolic CHF  Volume is not bad  I am not going to push regimen too much  Will get labs  2  Atrial fib  Rates OK  Keep on apixiban  3  Hypotension  BP improved on midorine  Will cut back to 10 / 5 /5    F/U in June    Current medicines are reviewed at  length with the patient today.  The patient does not have concerns regarding medicines.  Signed, [name], MD  01/01/2017 3:09 PM    [facility] [place], [place], [place]  [address] Phone: [phone]; Fax: [phone]

## 2017-01-01 ENCOUNTER — Ambulatory Visit (INDEPENDENT_AMBULATORY_CARE_PROVIDER_SITE_OTHER): Payer: Medicare Other | Admitting: Internal Medicine

## 2017-01-01 ENCOUNTER — Encounter: Payer: Self-pay | Admitting: Internal Medicine

## 2017-01-01 VITALS — BP 122/82 | HR 88 | Ht 69.0 in | Wt 191.2 lb

## 2017-01-01 DIAGNOSIS — I4891 Unspecified atrial fibrillation: Secondary | ICD-10-CM

## 2017-01-01 MED ORDER — MIDODRINE HCL 10 MG PO TABS: ORAL_TABLET | ORAL | Status: DC

## 2017-01-01 NOTE — Patient Instructions (Signed)
Medication Instructions:   DECREASE MIDODRINE TO 10 MG- 5 MG - 5 MG DAILY  Labwork:  Your physician recommends that you HAVE LAB WORK TODAY  Follow-Up:  Your physician recommends that you schedule a follow-up appointment in: [place]   If you need a refill on your cardiac medications before your next appointment, please call your pharmacy.

## 2017-01-02 LAB — COMPREHENSIVE METABOLIC PANEL
A/G RATIO: 0.7 — AB (ref 1.2–2.2)
ALT: 14 IU/L (ref 0–44)
AST: 40 IU/L (ref 0–40)
Albumin: 2.9 g/dL — ABNORMAL LOW (ref 3.5–4.8)
Alkaline Phosphatase: 72 IU/L (ref 39–117)
BILIRUBIN TOTAL: 1.2 mg/dL (ref 0.0–1.2)
BUN/Creatinine Ratio: 7 — ABNORMAL LOW (ref 10–24)
BUN: 6 mg/dL — AB (ref 8–27)
CALCIUM: 9 mg/dL (ref 8.6–10.2)
CHLORIDE: 107 mmol/L — AB (ref 96–106)
CO2: 21 mmol/L (ref 18–29)
Creatinine, Ser: 0.91 mg/dL (ref 0.76–1.27)
GFR calc Af Amer: 94 mL/min/{1.73_m2} (ref >59)
GFR, EST NON AFRICAN AMERICAN: 82 mL/min/{1.73_m2} (ref >59)
GLUCOSE: 80 mg/dL (ref 65–99)
Globulin, Total: 4.2 g/dL (ref 1.5–4.5)
POTASSIUM: 4.2 mmol/L (ref 3.5–5.2)
Sodium: 144 mmol/L (ref 134–144)
TOTAL PROTEIN: 7.1 g/dL (ref 6.0–8.5)

## 2017-01-02 LAB — CBC
Hematocrit: 38.9 % (ref 37.5–51.0)
Hemoglobin: 13.4 g/dL (ref 13.0–17.7)
MCH: 32.3 pg (ref 26.6–33.0)
MCHC: 34.4 g/dL (ref 31.5–35.7)
MCV: 94 fL (ref 79–97)
NRBC: 1 % — ABNORMAL HIGH (ref 0–0)
PLATELETS: 148 10*3/uL — AB (ref 150–379)
RBC: 4.15 x10E6/uL (ref 4.14–5.80)
RDW: 14.4 % (ref 12.3–15.4)
WBC: 3.6 10*3/uL (ref 3.4–10.8)

## 2017-01-04 DIAGNOSIS — I5022 Chronic systolic (congestive) heart failure: Secondary | ICD-10-CM | POA: Diagnosis not present

## 2017-01-04 DIAGNOSIS — I69393 Ataxia following cerebral infarction: Secondary | ICD-10-CM | POA: Diagnosis not present

## 2017-01-04 DIAGNOSIS — Z9181 History of falling: Secondary | ICD-10-CM | POA: Diagnosis not present

## 2017-01-04 DIAGNOSIS — E039 Hypothyroidism, unspecified: Secondary | ICD-10-CM | POA: Diagnosis not present

## 2017-01-04 DIAGNOSIS — Z9119 Patient's noncompliance with other medical treatment and regimen: Secondary | ICD-10-CM | POA: Diagnosis not present

## 2017-01-04 DIAGNOSIS — N390 Urinary tract infection, site not specified: Secondary | ICD-10-CM | POA: Diagnosis not present

## 2017-01-04 DIAGNOSIS — K7460 Unspecified cirrhosis of liver: Secondary | ICD-10-CM | POA: Diagnosis not present

## 2017-01-04 DIAGNOSIS — Z7901 Long term (current) use of anticoagulants: Secondary | ICD-10-CM | POA: Diagnosis not present

## 2017-01-04 DIAGNOSIS — I4891 Unspecified atrial fibrillation: Secondary | ICD-10-CM | POA: Diagnosis not present

## 2017-01-04 DIAGNOSIS — I429 Cardiomyopathy, unspecified: Secondary | ICD-10-CM | POA: Diagnosis not present

## 2017-01-04 DIAGNOSIS — Z86718 Personal history of other venous thrombosis and embolism: Secondary | ICD-10-CM | POA: Diagnosis not present

## 2017-01-04 DIAGNOSIS — I69354 Hemiplegia and hemiparesis following cerebral infarction affecting left non-dominant side: Secondary | ICD-10-CM | POA: Diagnosis not present

## 2017-01-08 ENCOUNTER — Telehealth: Payer: Self-pay | Admitting: *Deleted

## 2017-01-08 NOTE — Telephone Encounter (Signed)
-----   Message from Fay Records, MD sent at 01/03/2017 11:16 PM EDT ----- CBC is OK Electrolytes are OK No new recommendations

## 2017-01-08 NOTE — Telephone Encounter (Signed)
Pt notified of lab results by phone with verbal understanding. Pt thanked me for my call today and asked for me to please relay a message from him to Dr. Harrington Challenger , pt said Thank you !!

## 2017-01-12 DIAGNOSIS — I69354 Hemiplegia and hemiparesis following cerebral infarction affecting left non-dominant side: Secondary | ICD-10-CM | POA: Diagnosis not present

## 2017-01-12 DIAGNOSIS — K7460 Unspecified cirrhosis of liver: Secondary | ICD-10-CM | POA: Diagnosis not present

## 2017-01-12 DIAGNOSIS — Z9181 History of falling: Secondary | ICD-10-CM | POA: Diagnosis not present

## 2017-01-12 DIAGNOSIS — I429 Cardiomyopathy, unspecified: Secondary | ICD-10-CM | POA: Diagnosis not present

## 2017-01-12 DIAGNOSIS — Z9119 Patient's noncompliance with other medical treatment and regimen: Secondary | ICD-10-CM | POA: Diagnosis not present

## 2017-01-12 DIAGNOSIS — E039 Hypothyroidism, unspecified: Secondary | ICD-10-CM | POA: Diagnosis not present

## 2017-01-12 DIAGNOSIS — Z86718 Personal history of other venous thrombosis and embolism: Secondary | ICD-10-CM | POA: Diagnosis not present

## 2017-01-12 DIAGNOSIS — I69393 Ataxia following cerebral infarction: Secondary | ICD-10-CM | POA: Diagnosis not present

## 2017-01-12 DIAGNOSIS — I5022 Chronic systolic (congestive) heart failure: Secondary | ICD-10-CM | POA: Diagnosis not present

## 2017-01-12 DIAGNOSIS — Z7901 Long term (current) use of anticoagulants: Secondary | ICD-10-CM | POA: Diagnosis not present

## 2017-01-12 DIAGNOSIS — I4891 Unspecified atrial fibrillation: Secondary | ICD-10-CM | POA: Diagnosis not present

## 2017-01-12 DIAGNOSIS — N390 Urinary tract infection, site not specified: Secondary | ICD-10-CM | POA: Diagnosis not present

## 2017-01-14 DIAGNOSIS — E039 Hypothyroidism, unspecified: Secondary | ICD-10-CM | POA: Diagnosis not present

## 2017-01-14 DIAGNOSIS — N390 Urinary tract infection, site not specified: Secondary | ICD-10-CM | POA: Diagnosis not present

## 2017-01-14 DIAGNOSIS — K7460 Unspecified cirrhosis of liver: Secondary | ICD-10-CM | POA: Diagnosis not present

## 2017-01-14 DIAGNOSIS — Z7901 Long term (current) use of anticoagulants: Secondary | ICD-10-CM | POA: Diagnosis not present

## 2017-01-14 DIAGNOSIS — Z9181 History of falling: Secondary | ICD-10-CM | POA: Diagnosis not present

## 2017-01-14 DIAGNOSIS — I69354 Hemiplegia and hemiparesis following cerebral infarction affecting left non-dominant side: Secondary | ICD-10-CM | POA: Diagnosis not present

## 2017-01-14 DIAGNOSIS — I5022 Chronic systolic (congestive) heart failure: Secondary | ICD-10-CM | POA: Diagnosis not present

## 2017-01-14 DIAGNOSIS — I429 Cardiomyopathy, unspecified: Secondary | ICD-10-CM | POA: Diagnosis not present

## 2017-01-14 DIAGNOSIS — I4891 Unspecified atrial fibrillation: Secondary | ICD-10-CM | POA: Diagnosis not present

## 2017-01-14 DIAGNOSIS — I69393 Ataxia following cerebral infarction: Secondary | ICD-10-CM | POA: Diagnosis not present

## 2017-01-14 DIAGNOSIS — Z9119 Patient's noncompliance with other medical treatment and regimen: Secondary | ICD-10-CM | POA: Diagnosis not present

## 2017-01-14 DIAGNOSIS — Z86718 Personal history of other venous thrombosis and embolism: Secondary | ICD-10-CM | POA: Diagnosis not present

## 2017-01-19 ENCOUNTER — Ambulatory Visit (HOSPITAL_COMMUNITY)
Admission: RE | Admit: 2017-01-19 | Discharge: 2017-01-19 | Disposition: A | Discharge status: Home / Self Care | Payer: Medicare Other | Source: Ambulatory Visit | Attending: Gastroenterology | Admitting: Gastroenterology

## 2017-01-19 DIAGNOSIS — I69354 Hemiplegia and hemiparesis following cerebral infarction affecting left non-dominant side: Secondary | ICD-10-CM | POA: Diagnosis not present

## 2017-01-19 DIAGNOSIS — Z9119 Patient's noncompliance with other medical treatment and regimen: Secondary | ICD-10-CM | POA: Diagnosis not present

## 2017-01-19 DIAGNOSIS — K7460 Unspecified cirrhosis of liver: Secondary | ICD-10-CM | POA: Insufficient documentation

## 2017-01-19 DIAGNOSIS — Z86718 Personal history of other venous thrombosis and embolism: Secondary | ICD-10-CM | POA: Diagnosis not present

## 2017-01-19 DIAGNOSIS — Z7901 Long term (current) use of anticoagulants: Secondary | ICD-10-CM | POA: Diagnosis not present

## 2017-01-19 DIAGNOSIS — I4891 Unspecified atrial fibrillation: Secondary | ICD-10-CM | POA: Diagnosis not present

## 2017-01-19 DIAGNOSIS — B182 Chronic viral hepatitis C: Secondary | ICD-10-CM | POA: Diagnosis not present

## 2017-01-19 DIAGNOSIS — N390 Urinary tract infection, site not specified: Secondary | ICD-10-CM | POA: Diagnosis not present

## 2017-01-19 DIAGNOSIS — E039 Hypothyroidism, unspecified: Secondary | ICD-10-CM | POA: Diagnosis not present

## 2017-01-19 DIAGNOSIS — I429 Cardiomyopathy, unspecified: Secondary | ICD-10-CM | POA: Diagnosis not present

## 2017-01-19 DIAGNOSIS — Z9181 History of falling: Secondary | ICD-10-CM | POA: Diagnosis not present

## 2017-01-19 DIAGNOSIS — I5022 Chronic systolic (congestive) heart failure: Secondary | ICD-10-CM | POA: Diagnosis not present

## 2017-01-19 DIAGNOSIS — I69393 Ataxia following cerebral infarction: Secondary | ICD-10-CM | POA: Diagnosis not present

## 2017-01-25 ENCOUNTER — Telehealth: Payer: Self-pay | Admitting: Internal Medicine

## 2017-01-25 MED ORDER — MAGNESIUM OXIDE 400 (241.3 MG) MG PO TABS: 400.0000 mg | ORAL_TABLET | Freq: Every day | ORAL | 3 refills | Status: DC

## 2017-01-25 MED ORDER — APIXABAN 5 MG PO TABS: 5.0000 mg | ORAL_TABLET | Freq: Two times a day (BID) | ORAL | 3 refills | Status: DC

## 2017-01-25 NOTE — Telephone Encounter (Signed)
New message     *STAT* If patient is at the pharmacy, call can be transferred to refill team.   1. Which medications need to be refilled? (please list name of each medication and dose if known) apixaban (ELIQUIS) 5 MG TABS tablet & magnesium oxide (MAG-OX) 400 (241.3 Mg) MG tablet  2. Which pharmacy/location (including street and city if local pharmacy) is medication to be sent to? [facility] on [address]   3. Do they need a 30 day or 90 day supply? 90 days

## 2017-01-28 DIAGNOSIS — I63413 Cerebral infarction due to embolism of bilateral middle cerebral arteries: Secondary | ICD-10-CM | POA: Diagnosis not present

## 2017-01-29 DIAGNOSIS — I69393 Ataxia following cerebral infarction: Secondary | ICD-10-CM | POA: Diagnosis not present

## 2017-01-29 DIAGNOSIS — Z9181 History of falling: Secondary | ICD-10-CM | POA: Diagnosis not present

## 2017-01-29 DIAGNOSIS — I69354 Hemiplegia and hemiparesis following cerebral infarction affecting left non-dominant side: Secondary | ICD-10-CM | POA: Diagnosis not present

## 2017-01-29 DIAGNOSIS — Z9119 Patient's noncompliance with other medical treatment and regimen: Secondary | ICD-10-CM | POA: Diagnosis not present

## 2017-01-29 DIAGNOSIS — N390 Urinary tract infection, site not specified: Secondary | ICD-10-CM | POA: Diagnosis not present

## 2017-01-29 DIAGNOSIS — Z7901 Long term (current) use of anticoagulants: Secondary | ICD-10-CM | POA: Diagnosis not present

## 2017-01-29 DIAGNOSIS — Z86718 Personal history of other venous thrombosis and embolism: Secondary | ICD-10-CM | POA: Diagnosis not present

## 2017-01-29 DIAGNOSIS — E039 Hypothyroidism, unspecified: Secondary | ICD-10-CM | POA: Diagnosis not present

## 2017-01-29 DIAGNOSIS — I4891 Unspecified atrial fibrillation: Secondary | ICD-10-CM | POA: Diagnosis not present

## 2017-01-29 DIAGNOSIS — I429 Cardiomyopathy, unspecified: Secondary | ICD-10-CM | POA: Diagnosis not present

## 2017-01-29 DIAGNOSIS — I5022 Chronic systolic (congestive) heart failure: Secondary | ICD-10-CM | POA: Diagnosis not present

## 2017-01-29 DIAGNOSIS — K7460 Unspecified cirrhosis of liver: Secondary | ICD-10-CM | POA: Diagnosis not present

## 2017-02-11 DIAGNOSIS — E785 Hyperlipidemia, unspecified: Secondary | ICD-10-CM | POA: Diagnosis not present

## 2017-02-11 DIAGNOSIS — Z0000 Encounter for general adult medical examination without abnormal findings: Secondary | ICD-10-CM | POA: Diagnosis not present

## 2017-02-11 DIAGNOSIS — Z1321 Encounter for screening for nutritional disorder: Secondary | ICD-10-CM | POA: Diagnosis not present

## 2017-02-11 DIAGNOSIS — E612 Magnesium deficiency: Secondary | ICD-10-CM | POA: Diagnosis not present

## 2017-02-11 DIAGNOSIS — Z13220 Encounter for screening for lipoid disorders: Secondary | ICD-10-CM | POA: Diagnosis not present

## 2017-02-11 DIAGNOSIS — E039 Hypothyroidism, unspecified: Secondary | ICD-10-CM | POA: Diagnosis not present

## 2017-02-24 DIAGNOSIS — I5022 Chronic systolic (congestive) heart failure: Secondary | ICD-10-CM | POA: Diagnosis not present

## 2017-02-24 DIAGNOSIS — E039 Hypothyroidism, unspecified: Secondary | ICD-10-CM | POA: Diagnosis not present

## 2017-02-24 DIAGNOSIS — D709 Neutropenia, unspecified: Secondary | ICD-10-CM | POA: Diagnosis not present

## 2017-02-24 DIAGNOSIS — Z0000 Encounter for general adult medical examination without abnormal findings: Secondary | ICD-10-CM | POA: Diagnosis not present

## 2017-02-24 DIAGNOSIS — I129 Hypertensive chronic kidney disease with stage 1 through stage 4 chronic kidney disease, or unspecified chronic kidney disease: Secondary | ICD-10-CM | POA: Diagnosis not present

## 2017-02-24 DIAGNOSIS — N390 Urinary tract infection, site not specified: Secondary | ICD-10-CM | POA: Diagnosis not present

## 2017-02-24 DIAGNOSIS — N182 Chronic kidney disease, stage 2 (mild): Secondary | ICD-10-CM | POA: Diagnosis not present

## 2017-02-27 DIAGNOSIS — I63413 Cerebral infarction due to embolism of bilateral middle cerebral arteries: Secondary | ICD-10-CM | POA: Diagnosis not present

## 2017-03-30 DIAGNOSIS — I63413 Cerebral infarction due to embolism of bilateral middle cerebral arteries: Secondary | ICD-10-CM | POA: Diagnosis not present

## 2017-04-01 NOTE — Progress Notes (Signed)
Cardiology Office Note   Date:  04/02/2017   ID:  Nathan Frank, DOB Jun 25, 1941, MRN [id number]  PCP:  Thressa Sheller, MD  Cardiologist:   [name], MD   F/U of atrial fib      History of Present Illness: Nathan Frank is a 76 y.o. male with a history of atrial fib, EtOH and heroin abuse, CVA, NICM (LVEF 40 to 45%)  Cath in 2016 with normal coronary arteries   I saw the patient in March 2018  Tapered midodrine    SInce seen he has done well  Breathing is OK  No dizziness   No palpitations    No CP      Current Meds  Medication Sig  . apixaban (ELIQUIS) 5 MG TABS tablet Take 1 tablet (5 mg total) by mouth 2 (two) times daily.  . folic acid (FOLVITE) 1 MG tablet Take 1 mg by mouth daily.  Marland Kitchen levothyroxine (SYNTHROID, LEVOTHROID) 50 MCG tablet Take 1 tablet by mouth daily before breakfast.  . magnesium oxide (MAG-OX) 400 (241.3 Mg) MG tablet Take 1 tablet (400 mg total) by mouth daily.  . Melatonin 5 MG CAPS Take 1 capsule by mouth at bedtime.  . midodrine (PROAMATINE) 10 MG tablet Take 10mg  in the morning/ 5 mg at noon and 5 mg in the evening  . VITAMIN D, ERGOCALCIFEROL, PO Take 500 mg by mouth daily.     Allergies:   Nsaids and Oxycodone   Past Medical History:  Diagnosis Date  . Abnormal TSH   . Acute gastric ulcer   . Acute gastritis with hemorrhage   . Alcohol abuse   . Alcohol dependence ([place]) 02/02/2014  . Anemia   . Bifascicular block   . Cirrhosis ([place]) 06/04/2012  . Depressive disorder 02/01/2014  . Dizziness and giddiness 12/13/2014  . DVT of lower limb, acute ([place]) 06/06/2012  . DVT, lower extremity, recurrent ([place])    a. noted 2013. b. also dx 06/2016.  Marland Kitchen Essential hypertension   . Fatty liver   . Gallstones   . Gastritis 06/07/2012  . GERD (gastroesophageal reflux disease)   . GI bleed due to NSAIDs 12/13/2014  . Granulomatous gastritis   . Hematuria 06/04/2012  . Hepatitis C   . Hepatocellular carcinoma ([place])   . Heroin abuse    "I haven't done  that since I don't know when."  . Heroin overdose 02/20/2014  . Neuropathy ([place])   . NICM (nonischemic cardiomyopathy) ([place])    a. 04/2015: EF 45-50% by cath. b. EF 40-45% by echo 06/2016.  . NSTEMI (non-ST elevated myocardial infarction) ([place])    a. 04/2015 - patent coronaries. Etiology possibly due to coronary spasm versus embolus, stress cardiomyopathy (atypical), and aborted infarction related to plaque rupture with thrombosis and dissolution. Amlodipine started. Not on antiplatelets due to GIB/cirrhosis history.  . Oral thrush 06/05/2012  . Polysubstance abuse    THC, alcohol, heroin  . Prolonged Q-T interval on ECG    a. 12/2014 - treated with magnesium.  . Right knee pain 12/13/2014  . S/P alcohol detoxification 02/02/2014  . Stroke ([place]) 06/2016  . SVT (supraventricular tachycardia) ([place])    a. 12/2014 in setting of GIB, ETOH, NSAIDS, gastritis.  . Symptomatic cholelithiasis 12/15/2013  . Thrombocytopenia ([place])   . Upper GI bleeding 12/13/2014  . Weight loss 06/04/2012    Past Surgical History:  Procedure Laterality Date  . CARDIAC CATHETERIZATION N/A 04/15/2015   Procedure: Left Heart Cath and Coronary  Angiography;  Surgeon: Belva Crome, MD;  Location: [facility];  Service: Cardiovascular;  Laterality: N/A;  . CIRCUMCISION    . ESOPHAGOGASTRODUODENOSCOPY  06/07/2012   Procedure: ESOPHAGOGASTRODUODENOSCOPY (EGD);  Surgeon: Milus Banister, MD;  Location: [facility];  Service: Endoscopy;  Laterality: N/A;  may need treatment of varices  . ESOPHAGOGASTRODUODENOSCOPY N/A 12/14/2014   Procedure: ESOPHAGOGASTRODUODENOSCOPY (EGD);  Surgeon: Jerene Bears, MD;  Location: [facility] ENDOSCOPY;  Service: Endoscopy;  Laterality: N/A;  . IR GENERIC HISTORICAL  06/25/2016   IR US GUIDE VASC ACCESS LEFT 06/25/2016 Corrie Mckusick, DO [facility]  . IR GENERIC HISTORICAL  06/25/2016   IR RADIOLOGY PERIPHERAL GUIDED IV START 06/25/2016 Corrie Mckusick, DO [facility]  . IR GENERIC HISTORICAL  06/25/2016   IR  PERCUTANEOUS ART THROMBECTOMY/INFUSION INTRACRANIAL INC DIAG ANGIO 06/25/2016 Luanne Bras, MD [facility]  . RADIOLOGY WITH ANESTHESIA N/A 06/25/2016   Procedure: RADIOLOGY WITH ANESTHESIA;  Surgeon: Luanne Bras, MD;  Location: [facility];  Service: Radiology;  Laterality: N/A;  . TONSILLECTOMY       Social History:  The patient  reports that he has quit smoking. His smoking use included Cigarettes. He has a 2.50 pack-year smoking history. He has never used smokeless tobacco. He reports that he does not drink alcohol or use drugs.   Family History:  The patient's family history includes Heart disease in his mother; Prostate cancer in his brother; Stroke in his father.    ROS:  Please see the history of present illness. All other systems are reviewed and  Negative to the above problem except as noted.    PHYSICAL EXAM: VS:  BP 128/78   Pulse 82   Ht 5\' 9"  (1.753 m)   Wt 81.6 kg (180 lb)   BMI 26.58 kg/m   GEN: Well nourished, well developed, in no acute distress  HEENT: normal  Neck: no JVD, carotid bruits, or masses Cardiac: RRR; no murmurs, rubs, or gallops,no edema  Respiratory:  clear to auscultation bilaterally, normal work of breathing GI: soft, nontender, nondistended, + BS  No hepatomegaly  MS: no deformity Moving all extremities   Skin: warm and dry, no rash Neuro:  Strength and sensation are intact Psych: euthymic mood, full affect   EKG:  EKG is not ordered today.   Lipid Panel    Component Value Date/Time   CHOL 87 06/26/2016 0500   TRIG 91 06/27/2016 0219   HDL 26 (L) 06/26/2016 0500   CHOLHDL 3.3 06/26/2016 0500   VLDL 23 06/26/2016 0500   LDLCALC 38 06/26/2016 0500      Wt Readings from Last 3 Encounters:  04/02/17 81.6 kg (180 lb)  01/01/17 86.7 kg (191 lb 3.2 oz)  11/15/16 87.4 kg (192 lb 10.9 oz)      ASSESSMENT AND PLAN:  1  Hypotension  Tolerating taper of midodrine  He was started on midodrine last fall  Very sick with ? Infection  at time    Will taper to off over the next couple months and see back in clinic at end of summer     2  NICM  Volume is OK   Would like to get off of midodrine  COnsider other meds   3  Atrial fib Chronic  Continue apixiban  Current medicines are reviewed at length with the patient today.  The patient does not have concerns regarding medicines.  Signed, [name], MD  04/02/2017 10:26 AM    [facility]  [address], [place], [place]  [address] Phone: [phone]; Fax: [phone]

## 2017-04-02 ENCOUNTER — Ambulatory Visit (INDEPENDENT_AMBULATORY_CARE_PROVIDER_SITE_OTHER): Payer: Medicare Other | Admitting: Internal Medicine

## 2017-04-02 VITALS — BP 128/78 | HR 82 | Ht 69.0 in | Wt 180.0 lb

## 2017-04-02 DIAGNOSIS — I4891 Unspecified atrial fibrillation: Secondary | ICD-10-CM

## 2017-04-02 DIAGNOSIS — I950 Idiopathic hypotension: Secondary | ICD-10-CM

## 2017-04-02 DIAGNOSIS — I428 Other cardiomyopathies: Secondary | ICD-10-CM | POA: Diagnosis not present

## 2017-04-02 MED ORDER — MIDODRINE HCL 5 MG PO TABS: ORAL_TABLET | ORAL | 0 refills | Status: DC

## 2017-04-02 NOTE — Patient Instructions (Signed)
Cut back on midodrine to 5 mg 3x per day for 1 week    then cut to 5 mg 2x per day (8 AM and 2 PM) for 2 wks  then cut back to 2.5 mg 2x per day (8 AM and 2 PM ) for 3 wks  then Stop  Continue all other medications as listed.  Call if dizzy, weak    F/U in clinic in 2 months

## 2017-04-29 DIAGNOSIS — I63413 Cerebral infarction due to embolism of bilateral middle cerebral arteries: Secondary | ICD-10-CM | POA: Diagnosis not present

## 2017-05-12 ENCOUNTER — Inpatient Hospital Stay (HOSPITAL_COMMUNITY)
Admission: EM | Admit: 2017-05-12 | Discharge: 2017-05-17 | DRG: 065 | Disposition: A | Discharge status: Skilled Nursing Facility | Payer: Medicare Other | Attending: Internal Medicine | Admitting: Internal Medicine

## 2017-05-12 ENCOUNTER — Emergency Department (HOSPITAL_COMMUNITY): Payer: Medicare Other

## 2017-05-12 ENCOUNTER — Inpatient Hospital Stay (HOSPITAL_COMMUNITY): Payer: Medicare Other

## 2017-05-12 ENCOUNTER — Encounter (HOSPITAL_COMMUNITY): Payer: Self-pay

## 2017-05-12 DIAGNOSIS — I252 Old myocardial infarction: Secondary | ICD-10-CM

## 2017-05-12 DIAGNOSIS — I5022 Chronic systolic (congestive) heart failure: Secondary | ICD-10-CM | POA: Diagnosis present

## 2017-05-12 DIAGNOSIS — Z8673 Personal history of transient ischemic attack (TIA), and cerebral infarction without residual deficits: Secondary | ICD-10-CM

## 2017-05-12 DIAGNOSIS — I482 Chronic atrial fibrillation, unspecified: Secondary | ICD-10-CM | POA: Diagnosis present

## 2017-05-12 DIAGNOSIS — L03119 Cellulitis of unspecified part of limb: Secondary | ICD-10-CM | POA: Diagnosis not present

## 2017-05-12 DIAGNOSIS — F1020 Alcohol dependence, uncomplicated: Secondary | ICD-10-CM | POA: Diagnosis present

## 2017-05-12 DIAGNOSIS — G4089 Other seizures: Secondary | ICD-10-CM | POA: Diagnosis present

## 2017-05-12 DIAGNOSIS — K7290 Hepatic failure, unspecified without coma: Secondary | ICD-10-CM | POA: Diagnosis not present

## 2017-05-12 DIAGNOSIS — I4891 Unspecified atrial fibrillation: Secondary | ICD-10-CM | POA: Diagnosis not present

## 2017-05-12 DIAGNOSIS — G40109 Localization-related (focal) (partial) symptomatic epilepsy and epileptic syndromes with simple partial seizures, not intractable, without status epilepticus: Secondary | ICD-10-CM

## 2017-05-12 DIAGNOSIS — Z9114 Patient's other noncompliance with medication regimen: Secondary | ICD-10-CM

## 2017-05-12 DIAGNOSIS — I110 Hypertensive heart disease with heart failure: Secondary | ICD-10-CM | POA: Diagnosis present

## 2017-05-12 DIAGNOSIS — R569 Unspecified convulsions: Secondary | ICD-10-CM | POA: Diagnosis not present

## 2017-05-12 DIAGNOSIS — F1110 Opioid abuse, uncomplicated: Secondary | ICD-10-CM | POA: Diagnosis present

## 2017-05-12 DIAGNOSIS — Z8249 Family history of ischemic heart disease and other diseases of the circulatory system: Secondary | ICD-10-CM

## 2017-05-12 DIAGNOSIS — Z888 Allergy status to other drugs, medicaments and biological substances status: Secondary | ICD-10-CM

## 2017-05-12 DIAGNOSIS — I10 Essential (primary) hypertension: Secondary | ICD-10-CM | POA: Diagnosis not present

## 2017-05-12 DIAGNOSIS — I2510 Atherosclerotic heart disease of native coronary artery without angina pectoris: Secondary | ICD-10-CM | POA: Diagnosis present

## 2017-05-12 DIAGNOSIS — I071 Rheumatic tricuspid insufficiency: Secondary | ICD-10-CM | POA: Diagnosis not present

## 2017-05-12 DIAGNOSIS — Z823 Family history of stroke: Secondary | ICD-10-CM

## 2017-05-12 DIAGNOSIS — Z885 Allergy status to narcotic agent status: Secondary | ICD-10-CM

## 2017-05-12 DIAGNOSIS — I5020 Unspecified systolic (congestive) heart failure: Secondary | ICD-10-CM | POA: Diagnosis not present

## 2017-05-12 DIAGNOSIS — I6340 Cerebral infarction due to embolism of unspecified cerebral artery: Secondary | ICD-10-CM | POA: Diagnosis not present

## 2017-05-12 DIAGNOSIS — Z9119 Patient's noncompliance with other medical treatment and regimen: Secondary | ICD-10-CM | POA: Diagnosis not present

## 2017-05-12 DIAGNOSIS — Z8505 Personal history of malignant neoplasm of liver: Secondary | ICD-10-CM | POA: Diagnosis not present

## 2017-05-12 DIAGNOSIS — K7030 Alcoholic cirrhosis of liver without ascites: Secondary | ICD-10-CM | POA: Diagnosis present

## 2017-05-12 DIAGNOSIS — I255 Ischemic cardiomyopathy: Secondary | ICD-10-CM | POA: Diagnosis present

## 2017-05-12 DIAGNOSIS — Z87891 Personal history of nicotine dependence: Secondary | ICD-10-CM | POA: Diagnosis not present

## 2017-05-12 DIAGNOSIS — B1920 Unspecified viral hepatitis C without hepatic coma: Secondary | ICD-10-CM | POA: Diagnosis present

## 2017-05-12 DIAGNOSIS — B182 Chronic viral hepatitis C: Secondary | ICD-10-CM | POA: Diagnosis present

## 2017-05-12 DIAGNOSIS — R079 Chest pain, unspecified: Secondary | ICD-10-CM | POA: Diagnosis not present

## 2017-05-12 DIAGNOSIS — R000 Tachycardia, unspecified: Secondary | ICD-10-CM | POA: Diagnosis not present

## 2017-05-12 DIAGNOSIS — I69354 Hemiplegia and hemiparesis following cerebral infarction affecting left non-dominant side: Secondary | ICD-10-CM | POA: Diagnosis not present

## 2017-05-12 DIAGNOSIS — R29818 Other symptoms and signs involving the nervous system: Secondary | ICD-10-CM | POA: Diagnosis not present

## 2017-05-12 DIAGNOSIS — E039 Hypothyroidism, unspecified: Secondary | ICD-10-CM | POA: Diagnosis not present

## 2017-05-12 DIAGNOSIS — D709 Neutropenia, unspecified: Secondary | ICD-10-CM | POA: Diagnosis not present

## 2017-05-12 DIAGNOSIS — I340 Nonrheumatic mitral (valve) insufficiency: Secondary | ICD-10-CM | POA: Diagnosis not present

## 2017-05-12 DIAGNOSIS — Z87898 Personal history of other specified conditions: Secondary | ICD-10-CM | POA: Diagnosis not present

## 2017-05-12 DIAGNOSIS — Z7901 Long term (current) use of anticoagulants: Secondary | ICD-10-CM

## 2017-05-12 DIAGNOSIS — Z8042 Family history of malignant neoplasm of prostate: Secondary | ICD-10-CM

## 2017-05-12 DIAGNOSIS — D696 Thrombocytopenia, unspecified: Secondary | ICD-10-CM | POA: Diagnosis present

## 2017-05-12 DIAGNOSIS — I63411 Cerebral infarction due to embolism of right middle cerebral artery: Secondary | ICD-10-CM

## 2017-05-12 DIAGNOSIS — R29701 NIHSS score 1: Secondary | ICD-10-CM | POA: Diagnosis present

## 2017-05-12 DIAGNOSIS — I361 Nonrheumatic tricuspid (valve) insufficiency: Secondary | ICD-10-CM | POA: Diagnosis not present

## 2017-05-12 DIAGNOSIS — F1011 Alcohol abuse, in remission: Secondary | ICD-10-CM

## 2017-05-12 DIAGNOSIS — R29810 Facial weakness: Secondary | ICD-10-CM | POA: Diagnosis present

## 2017-05-12 LAB — COMPREHENSIVE METABOLIC PANEL
ALT: 17 U/L (ref 17–63)
AST: 50 U/L — AB (ref 15–41)
Albumin: 2.8 g/dL — ABNORMAL LOW (ref 3.5–5.0)
Alkaline Phosphatase: 68 U/L (ref 38–126)
Anion gap: 8 (ref 5–15)
BUN: 9 mg/dL (ref 6–20)
CALCIUM: 8.4 mg/dL — AB (ref 8.9–10.3)
CHLORIDE: 112 mmol/L — AB (ref 101–111)
CO2: 23 mmol/L (ref 22–32)
CREATININE: 0.9 mg/dL (ref 0.61–1.24)
Glucose, Bld: 104 mg/dL — ABNORMAL HIGH (ref 65–99)
Potassium: 4.4 mmol/L (ref 3.5–5.1)
SODIUM: 143 mmol/L (ref 135–145)
Total Bilirubin: 1.4 mg/dL — ABNORMAL HIGH (ref 0.3–1.2)
Total Protein: 6.3 g/dL — ABNORMAL LOW (ref 6.5–8.1)

## 2017-05-12 LAB — I-STAT CHEM 8, ED
BUN: 13 mg/dL (ref 6–20)
CREATININE: 0.7 mg/dL (ref 0.61–1.24)
Calcium, Ion: 1.08 mmol/L — ABNORMAL LOW (ref 1.15–1.40)
Chloride: 110 mmol/L (ref 101–111)
GLUCOSE: 97 mg/dL (ref 65–99)
HEMATOCRIT: 40 % (ref 39.0–52.0)
Hemoglobin: 13.6 g/dL (ref 13.0–17.0)
POTASSIUM: 4.3 mmol/L (ref 3.5–5.1)
Sodium: 147 mmol/L — ABNORMAL HIGH (ref 135–145)
TCO2: 23 mmol/L (ref 0–100)

## 2017-05-12 LAB — DIFFERENTIAL
BASOS ABS: 0 10*3/uL (ref 0.0–0.1)
BASOS PCT: 0 %
Eosinophils Absolute: 0.1 10*3/uL (ref 0.0–0.7)
Eosinophils Relative: 2 %
LYMPHS PCT: 56 %
Lymphs Abs: 3.1 10*3/uL (ref 0.7–4.0)
MONO ABS: 0.6 10*3/uL (ref 0.1–1.0)
MONOS PCT: 11 %
NEUTROS ABS: 1.7 10*3/uL (ref 1.7–7.7)
Neutrophils Relative %: 32 %

## 2017-05-12 LAB — CBC
HEMATOCRIT: 37.1 % — AB (ref 39.0–52.0)
Hemoglobin: 12.9 g/dL — ABNORMAL LOW (ref 13.0–17.0)
MCH: 30.4 pg (ref 26.0–34.0)
MCHC: 34.8 g/dL (ref 30.0–36.0)
MCV: 87.3 fL (ref 78.0–100.0)
Platelets: 132 10*3/uL — ABNORMAL LOW (ref 150–400)
RBC: 4.25 MIL/uL (ref 4.22–5.81)
RDW: 16.4 % — AB (ref 11.5–15.5)
WBC: 5.5 10*3/uL (ref 4.0–10.5)

## 2017-05-12 LAB — BASIC METABOLIC PANEL
Anion gap: 9 (ref 5–15)
BUN: 11 mg/dL (ref 6–20)
CALCIUM: 8.1 mg/dL — AB (ref 8.9–10.3)
CO2: 21 mmol/L — AB (ref 22–32)
Chloride: 113 mmol/L — ABNORMAL HIGH (ref 101–111)
Creatinine, Ser: 0.73 mg/dL (ref 0.61–1.24)
GFR calc Af Amer: >60 mL/min (ref >60)
GFR calc non Af Amer: >60 mL/min (ref >60)
GLUCOSE: 90 mg/dL (ref 65–99)
Potassium: 4.1 mmol/L (ref 3.5–5.1)
Sodium: 143 mmol/L (ref 135–145)

## 2017-05-12 LAB — RAPID URINE DRUG SCREEN, HOSP PERFORMED
AMPHETAMINES: NOT DETECTED
BARBITURATES: NOT DETECTED
BENZODIAZEPINES: NOT DETECTED
Cocaine: NOT DETECTED
Opiates: NOT DETECTED
TETRAHYDROCANNABINOL: NOT DETECTED

## 2017-05-12 LAB — APTT: aPTT: 28 seconds (ref 24–36)

## 2017-05-12 LAB — I-STAT TROPONIN, ED: Troponin i, poc: 0.05 ng/mL (ref 0.00–0.08)

## 2017-05-12 LAB — ETHANOL

## 2017-05-12 LAB — PROTIME-INR
INR: 2.09
Prothrombin Time: 23.9 seconds — ABNORMAL HIGH (ref 11.4–15.2)

## 2017-05-12 LAB — TSH: TSH: 1.314 u[IU]/mL (ref 0.350–4.500)

## 2017-05-12 LAB — CBG MONITORING, ED: Glucose-Capillary: 99 mg/dL (ref 65–99)

## 2017-05-12 MED ORDER — LEVETIRACETAM 500 MG PO TABS: 500.0000 mg | ORAL_TABLET | Freq: Two times a day (BID) | ORAL | Status: DC

## 2017-05-12 MED ORDER — LEVOTHYROXINE SODIUM 100 MCG IV SOLR
25.0000 ug | Freq: Every day | INTRAVENOUS | Status: DC
  Administered 2017-05-12 – 2017-05-14 (×3): 25 ug via INTRAVENOUS
  Filled 2017-05-12 (×3): qty 5

## 2017-05-12 MED ORDER — ACETAMINOPHEN 650 MG RE SUPP: 650.0000 mg | Freq: Four times a day (QID) | RECTAL | Status: DC | PRN

## 2017-05-12 MED ORDER — LEVETIRACETAM 500 MG/5ML IV SOLN
1000.0000 mg | Freq: Two times a day (BID) | INTRAVENOUS | Status: DC
  Administered 2017-05-12 – 2017-05-15 (×6): 1000 mg via INTRAVENOUS
  Filled 2017-05-12 (×7): qty 10

## 2017-05-12 MED ORDER — METOPROLOL TARTRATE 5 MG/5ML IV SOLN
5.0000 mg | Freq: Four times a day (QID) | INTRAVENOUS | Status: DC
  Administered 2017-05-12 – 2017-05-14 (×7): 5 mg via INTRAVENOUS
  Filled 2017-05-12 (×7): qty 5

## 2017-05-12 MED ORDER — ASPIRIN 300 MG RE SUPP: 300.0000 mg | Freq: Every day | RECTAL | Status: DC

## 2017-05-12 MED ORDER — ACETAMINOPHEN 325 MG PO TABS
650.0000 mg | ORAL_TABLET | Freq: Four times a day (QID) | ORAL | Status: DC | PRN
  Administered 2017-05-16: 650 mg via ORAL
  Filled 2017-05-12 (×2): qty 2

## 2017-05-12 MED ORDER — SODIUM CHLORIDE 0.9 % IV SOLN
15.0000 mg/kg | Freq: Once | INTRAVENOUS | Status: AC
  Administered 2017-05-12: 1200 mg via INTRAVENOUS
  Filled 2017-05-12: qty 24

## 2017-05-12 MED ORDER — SODIUM CHLORIDE 0.9 % IV SOLN
INTRAVENOUS | Status: DC
  Administered 2017-05-12 (×2): via INTRAVENOUS

## 2017-05-12 MED ORDER — GADOBENATE DIMEGLUMINE 529 MG/ML IV SOLN: 20.0000 mL | Freq: Once | INTRAVENOUS | Status: DC

## 2017-05-12 MED ORDER — LORAZEPAM 2 MG/ML IJ SOLN
1.0000 mg | Freq: Once | INTRAMUSCULAR | Status: AC
  Administered 2017-05-12: 1 mg via INTRAVENOUS

## 2017-05-12 MED ORDER — SODIUM CHLORIDE 0.9 % IV SOLN
500.0000 mg | Freq: Two times a day (BID) | INTRAVENOUS | Status: DC
  Administered 2017-05-12: 500 mg via INTRAVENOUS
  Filled 2017-05-12 (×3): qty 5

## 2017-05-12 MED ORDER — STROKE: EARLY STAGES OF RECOVERY BOOK
Freq: Once | Status: AC
  Administered 2017-05-12: 1
  Filled 2017-05-12: qty 1

## 2017-05-12 MED ORDER — APIXABAN 5 MG PO TABS
5.0000 mg | ORAL_TABLET | Freq: Two times a day (BID) | ORAL | Status: DC
  Administered 2017-05-12 – 2017-05-16 (×9): 5 mg via ORAL
  Filled 2017-05-12 (×9): qty 1

## 2017-05-12 MED ORDER — SODIUM CHLORIDE 0.9% FLUSH
3.0000 mL | Freq: Two times a day (BID) | INTRAVENOUS | Status: DC
  Administered 2017-05-13: 3 mL via INTRAVENOUS
  Administered 2017-05-13: 10 mL via INTRAVENOUS
  Administered 2017-05-14 – 2017-05-17 (×2): 3 mL via INTRAVENOUS

## 2017-05-12 MED ORDER — SODIUM CHLORIDE 0.9 % IV SOLN
1000.0000 mg | Freq: Once | INTRAVENOUS | Status: AC
  Administered 2017-05-12: 1000 mg via INTRAVENOUS
  Filled 2017-05-12: qty 10

## 2017-05-12 MED ORDER — IOPAMIDOL (ISOVUE-370) INJECTION 76%
INTRAVENOUS | Status: AC
  Administered 2017-05-12: 50 mL
  Filled 2017-05-12: qty 50

## 2017-05-12 MED ORDER — ASPIRIN 325 MG PO TABS
325.0000 mg | ORAL_TABLET | Freq: Every day | ORAL | Status: DC
  Administered 2017-05-12 – 2017-05-14 (×3): 325 mg via ORAL
  Filled 2017-05-12 (×3): qty 1

## 2017-05-12 NOTE — ED Notes (Signed)
Pt's son, Hilliard Clark phone number: [phone]  Dtr in lawReginold Agent:  [phone]

## 2017-05-12 NOTE — H&P (Signed)
History and Physical    Nathan Frank VFI:[id number] DOB: Sep 08, 1941 DOA: 05/12/2017   PCP: Thressa Sheller, MD   Attending physician: Marily Memos  Patient coming from/Resides with: Private residence/wife  Chief Complaint: New onset seizures  HPI: Nathan Frank is a 76 y.o. male with medical history significant for hepatitis C, alcohol abuse, history of CVA with residual left hemiparesis, nonischemic cardiomyopathy with an EF of 40-45%, hypothyroidism, atrial fibrillation eliquis, and history of noncompliance with medical therapies.patient was sent to the ER after his wife noticed that his left face, arm and leg were twitching. Given patient's history of CVA a code stroke was initiated. Upon arrival to the ER he was able to answer most questions appropriately but was noted with a leftward gaze, left facialroop, left facial,left arm and left leg twitching.initial CT of the head was unremarkable. He was initially given 2 doses of IV Ativan but twitching persisted therefore he was given 1 g IV Keppra followed by fosphenytoin 1200 mg. The seizures finally broke after completion of the fosphenytoin. He has been evaluated by neurology who suspects he has developed simple partial seizures in the setting of his prior stroke. Recommendations given for medical therapies as well as additional diagnostic evaluation.we have been asked to admit the patient.  ED Course:  Vital Signs: BP (!) 124/96   Pulse (!) 105   Temp 97.6 F (36.4 C)   Resp 15   SpO2 96%  CT head without contrast: No acute intracranial process on a moderate to severely motion degraded examination  Lab data: Sodium 143, potassium 4.4, chloride 112, CO2 23, glucose 14, BUN 9, creatinine 0.9, albumin 2.8, AST 50, total bilirubin 1.4, poc troponin 0.05, white count 5500 with differential, hemoglobin 12.9, platelets 132,000; PTT 23.9, INR 2.9, PTT 28 Medications and treatments: Ativan 1 mg IV 2 doses, Keppra 1 g IV 1, fosphenytoin 1200 mg  IV 1  Review of Systems:  In addition to the HPI above,  No Fever-chills, myalgias or other constitutional symptoms No Headache, changes with Vision or hearing, new weakness, tingling, numbness in any extremity, dizziness, dysarthria or word finding difficulty, gait disturbance or imbalance No problems swallowing food or Liquids, indigestion/reflux, choking or coughing while eating, abdominal pain with or after eating No Chest pain, Cough or Shortness of Breath, palpitations, orthopnea or DOE No Abdominal pain, N/V, melena,hematochezia, dark tarry stools, constipation No dysuria, malodorous urine, hematuria or flank pain No new skin rashes, lesions, masses or bruises, No new joint pains, aches, swelling or redness No recent unintentional weight gain or loss No polyuria, polydypsia or polyphagia   Past Medical History:  Diagnosis Date  . Abnormal TSH   . Acute gastric ulcer   . Acute gastritis with hemorrhage   . Alcohol abuse   . Alcohol dependence ([place]) 02/02/2014  . Anemia   . Bifascicular block   . Cirrhosis ([place]) 06/04/2012  . Depressive disorder 02/01/2014  . Dizziness and giddiness 12/13/2014  . DVT of lower limb, acute ([place]) 06/06/2012  . DVT, lower extremity, recurrent ([place])    a. noted 2013. b. also dx 06/2016.  Marland Kitchen Essential hypertension   . Fatty liver   . Gallstones   . Gastritis 06/07/2012  . GERD (gastroesophageal reflux disease)   . GI bleed due to NSAIDs 12/13/2014  . Granulomatous gastritis   . Hematuria 06/04/2012  . Hepatitis C   . Hepatocellular carcinoma ([place])   . Heroin abuse    "I haven't done that since I don't know  when."  . Heroin overdose 02/20/2014  . Neuropathy   . NICM (nonischemic cardiomyopathy) ([place])    a. 04/2015: EF 45-50% by cath. b. EF 40-45% by echo 06/2016.  . NSTEMI (non-ST elevated myocardial infarction) ([place])    a. 04/2015 - patent coronaries. Etiology possibly due to coronary spasm versus embolus, stress cardiomyopathy (atypical), and aborted  infarction related to plaque rupture with thrombosis and dissolution. Amlodipine started. Not on antiplatelets due to GIB/cirrhosis history.  . Oral thrush 06/05/2012  . Polysubstance abuse    THC, alcohol, heroin  . Prolonged Q-T interval on ECG    a. 12/2014 - treated with magnesium.  . Right knee pain 12/13/2014  . S/P alcohol detoxification 02/02/2014  . Stroke ([place]) 06/2016  . SVT (supraventricular tachycardia) ([place])    a. 12/2014 in setting of GIB, ETOH, NSAIDS, gastritis.  . Symptomatic cholelithiasis 12/15/2013  . Thrombocytopenia ([place])   . Upper GI bleeding 12/13/2014  . Weight loss 06/04/2012    Past Surgical History:  Procedure Laterality Date  . CARDIAC CATHETERIZATION N/A 04/15/2015   Procedure: Left Heart Cath and Coronary Angiography;  Surgeon: Belva Crome, MD;  Location: [facility];  Service: Cardiovascular;  Laterality: N/A;  . CIRCUMCISION    . ESOPHAGOGASTRODUODENOSCOPY  06/07/2012   Procedure: ESOPHAGOGASTRODUODENOSCOPY (EGD);  Surgeon: Milus Banister, MD;  Location: [facility];  Service: Endoscopy;  Laterality: N/A;  may need treatment of varices  . ESOPHAGOGASTRODUODENOSCOPY N/A 12/14/2014   Procedure: ESOPHAGOGASTRODUODENOSCOPY (EGD);  Surgeon: Jerene Bears, MD;  Location[facility] ENDOSCOPY;  Service: Endoscopy;  Laterality: N/A;  . IR GENERIC HISTORICAL  06/25/2016   IR US GUIDE VASC ACCESS LEFT 06/25/2016 Corrie Mckusick, DO [facility]  . IR GENERIC HISTORICAL  06/25/2016   IR RADIOLOGY PERIPHERAL GUIDED IV START 06/25/2016 Corrie Mckusick, DO [facility]  . IR GENERIC HISTORICAL  06/25/2016   IR PERCUTANEOUS ART THROMBECTOMY/INFUSION INTRACRANIAL INC DIAG ANGIO 06/25/2016 Luanne Bras, MD [facility]  . RADIOLOGY WITH ANESTHESIA N/A 06/25/2016   Procedure: RADIOLOGY WITH ANESTHESIA;  Surgeon: Luanne Bras, MD;  Location: [facility];  Service: Radiology;  Laterality: N/A;  . TONSILLECTOMY      Social History   Social History  . Marital status: Married     Spouse name: N/A  . Number of children: 3  . Years of education: N/A   Occupational History  . Retired Education officer, [employer]    Social History Main Topics  . Smoking status: Former Smoker    Packs/day: 0.50    Years: 5.00    Types: Cigarettes  . Smokeless tobacco: Never Used     Comment: "quit smoking cigarettes in the 1970's"  . Alcohol use 4.2 oz/week    7 Glasses of wine per week     Comment: Drinks at least one glass of wine per day-with last drink on 05/11/2017  . Drug use: No     Comment: former heroin user-- last used 3 months ago-  . Sexual activity: Not Currently    Birth control/ protection: Condom   Other Topics Concern  . Not on file   Social History Narrative   Lost one son to a gunshot.  Lives with wife.      Mobility: RW Work history: Not obtained   Allergies  Allergen Reactions  . Nsaids Other (See Comments)    Acute gastric ulcer  . Oxycodone Other (See Comments)    as per the risk calculating tool RIOSORD (Risk Index for Overdose or Serious Opioid -  induced Respiratory Depression Risk ) calculated risk in ensuing 6 months  = 83% ( see Problem List for discussion)    Family History  Problem Relation Age of Onset  . Stroke Father   . Prostate cancer Brother   . Heart disease Mother        Pacemaker  . Diabetes Neg Hx      Prior to Admission medications   Medication Sig Start Date End Date Taking? Authorizing Provider  apixaban (ELIQUIS) 5 MG TABS tablet Take 1 tablet (5 mg total) by mouth 2 (two) times daily. 01/25/17   Fay Records, MD  folic acid (FOLVITE) 1 MG tablet Take 1 mg by mouth daily. 12/03/16   [provider]  levothyroxine (SYNTHROID, LEVOTHROID) 50 MCG tablet Take 1 tablet by mouth daily before breakfast. 12/29/16   [provider]  magnesium oxide (MAG-OX) 400 (241.3 Mg) MG tablet Take 1 tablet (400 mg total) by mouth daily. 01/25/17   Fay Records, MD  Melatonin 5 MG CAPS Take 1 capsule by mouth at bedtime.    [provider]  metoprolol tartrate (LOPRESSOR) 25 MG tablet Take 0.5 tablets (12.5 mg total) by mouth 2 (two) times daily. 11/15/16 12/15/16  Reyne Dumas, MD  midodrine (PROAMATINE) 5 MG tablet 5 mg three times a day for 1 wk, then 5 mg twice a day (8am/2pm) for 2 wks, then 2.5 mg twice a day for 3 wks, then stop 04/02/17   Fay Records, MD  VITAMIN D, ERGOCALCIFEROL, PO Take 500 mg by mouth daily.    [provider]    Physical Exam: Vitals:   05/12/17 0645 05/12/17 0715 05/12/17 0800 05/12/17 0830  BP: 104/80 (!) 124/96 117/84 116/84  Pulse: 80 (!) 105 68 (!) 106  Resp: 17 15 (!) 22 (!) 0  Temp:      SpO2: 98% 96% 100% 98%      Constitutional: NAD, calm, comfortable Eyes: PERRL, lids and conjunctivae normal ENMT: Mucous membranes are Dry. Posterior pharynx clear of any exudate or lesions. Poor dentition.  Neck: normal, supple, no masses, no thyromegaly Respiratory: clear to auscultation bilaterally, no wheezing, no crackles. Normal respiratory effort. No accessory muscle use.  Cardiovascular: Regular rate and rhythm, no murmurs / rubs / gallops. No extremity edema. 2+ pedal pulses. No carotid bruits.  Abdomen: no tenderness, no masses palpated. No hepatosplenomegaly. Bowel sounds positive.  Musculoskeletal: no clubbing / cyanosis. No joint deformity upper and lower extremities. Good ROM, no contractures. Normal muscle tone.  Skin: no rashes, lesions, ulcers. No induration Neurologic: CN 2-12 grossly intact. Sensation intact, DTR normal. Strength 5/5 x all 4 extremities. No tremors or seizure activity noted Psychiatric: Normal judgment and insight. Awakens easily and oriented x 3. He is somewhat sleepy Normal mood.    Labs on Admission: I have personally reviewed following labs and imaging studies  CBC:  Recent Labs Lab 05/12/17 0155 05/12/17 0203  WBC 5.5  --   NEUTROABS 1.7  --   HGB 12.9* 13.6  HCT 37.1* 40.0  MCV 87.3  --   PLT 132*  --    Basic Metabolic  Panel:  Recent Labs Lab 05/12/17 0155 05/12/17 0203  NA 143 147*  K 4.4 4.3  CL 112* 110  CO2 23  --   GLUCOSE 104* 97  BUN 9 13  CREATININE 0.90 0.70  CALCIUM 8.4*  --    GFR: CrCl cannot be calculated (Unknown ideal weight.). Liver Function Tests:  Recent Labs Lab 05/12/17 0155  AST 50*  ALT 17  ALKPHOS 68  BILITOT 1.4*  PROT 6.3*  ALBUMIN 2.8*   No results for input(s): LIPASE, AMYLASE in the last 168 hours. No results for input(s): AMMONIA in the last 168 hours. Coagulation Profile:  Recent Labs Lab 05/12/17 0155  INR 2.09   Cardiac Enzymes: No results for input(s): CKTOTAL, CKMB, CKMBINDEX, TROPONINI in the last 168 hours. BNP (last 3 results) No results for input(s): PROBNP in the last 8760 hours. HbA1C: No results for input(s): HGBA1C in the last 72 hours. CBG:  Recent Labs Lab 05/12/17 0150  GLUCAP 99   Lipid Profile: No results for input(s): CHOL, HDL, LDLCALC, TRIG, CHOLHDL, LDLDIRECT in the last 72 hours. Thyroid Function Tests: No results for input(s): TSH, T4TOTAL, FREET4, T3FREE, THYROIDAB in the last 72 hours. Anemia Panel: No results for input(s): VITAMINB12, FOLATE, FERRITIN, TIBC, IRON, RETICCTPCT in the last 72 hours. Urine analysis:    Component Value Date/Time   COLORURINE AMBER (A) 11/13/2016 0316   APPEARANCEUR HAZY (A) 11/13/2016 0316   LABSPEC 1.023 11/13/2016 0316   PHURINE 5.0 11/13/2016 0316   GLUCOSEU NEGATIVE 11/13/2016 0316   HGBUR MODERATE (A) 11/13/2016 0316   BILIRUBINUR SMALL (A) 11/13/2016 0316   KETONESUR NEGATIVE 11/13/2016 0316   PROTEINUR 100 (A) 11/13/2016 0316   UROBILINOGEN 2.0 (H) 12/13/2014 1256   NITRITE NEGATIVE 11/13/2016 0316   LEUKOCYTESUR TRACE (A) 11/13/2016 0316   Sepsis Labs: @LABRCNTIP (procalcitonin:4,lacticidven:4) )No results found for this or any previous visit (from the past 240 hour(s)).   Radiological Exams on Admission: Ct Head Code Stroke Wo Contrast  Addendum Date: 05/12/2017    ADDENDUM REPORT: 05/12/2017 02:33 ADDENDUM: Critical Value/emergent results were called by telephone at the time of interpretation on 05/12/2017 at 2:30 am to Dr. Lorraine Lax, Neurology, who verbally acknowledged these results. Electronically Signed   By: Elon Alas M.D.   On: 05/12/2017 02:33   Result Date: 05/12/2017 CLINICAL DATA:  Code stroke. Altered level of consciousness. Seizure like activity. History of alcohol abuse, hypertension, cirrhosis, stroke. EXAM: CT HEAD WITHOUT CONTRAST TECHNIQUE: Contiguous axial images were obtained from the base of the skull through the vertex without intravenous contrast. COMPARISON:  CT HEAD November 22, 2016 FINDINGS: Moderate to severely motion degraded examination. BRAIN: No intraparenchymal hemorrhage, mass effect, midline shift. Old RIGHT basal ganglia infarct. Confluent RIGHT frontoparietal temporal lobe encephalomalacia. LEFT temporoparietal lobe encephalomalacia. Ex vacuo dilatation RIGHT lateral ventricle, no hydrocephalus. No definite acute large vascular territory infarct though limited by patient motion. No abnormal extra-axial fluid collections. Basal cisterns are patent. VASCULAR: Moderate calcific atherosclerosis of the carotid siphons. SKULL: No skull fracture. No significant scalp soft tissue swelling. SINUSES/ORBITS: The mastoid air-cells and included paranasal sinuses are well-aerated.The included ocular globes and orbital contents are non-suspicious. OTHER: Patient is edentulous. ASPECTS [facility] Stroke Program Early CT Score) - Ganglionic level infarction (caudate, lentiform nuclei, internal capsule, insula, M1-M3 cortex): 7 - Supraganglionic infarction (M4-M6 cortex): 3 Total score (0-10 with 10 being normal): 10 IMPRESSION: 1. No acute intracranial process on this moderate to severely motion degraded examination. 2. Stable examination including bilateral MCA territory infarcts. 3. ASPECTS is 10 (sensitivity decreased due to motion). Dr. Lorraine Lax,  Neurology paged by PRA using standard paging system on 05/12/2017 at 2:15 am, awaiting return call. Electronically Signed: By: Elon Alas M.D. 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 Question emboli -On eliquis prior to admission-?? Compliance-INR and PT elevated at time of admission -CTA head and neck to better clarify -Stroke order set initiated -Echocardiogram-likely will require TEE -PT/OT/SOP evaluation -Added ASA -Continue frequent neurological checks  Chronic systolic heart failure EF 40-45%/moderate to severe tricuspid regurgitation -Currently compensated -Convert beta blocker to IV -Not on aspirin or statin prior to admission  Chronic atrial fibrillation -Initially and if acutely tachycardic during seizure activity but current heart rate between 68 and 106 bpm -We'll utilize Lopressor IV scheduled -CHADVASC=5  Ongoing alcohol abuse/history of heroin abuse -Patient's states drank wine last yesterday evening so doubt seizures related to alcohol withdrawal -Alcohol level -UDS  Hepatitis C/chronic thrombocytopenia -Mild elevation AST 50 likely related to ongoing alcohol abuse -Platelets are stable with usual  baseline readings between 71,001 148,000  Hypothyroidism -Convert Synthroid to IV -Obtain TSH    DVT prophylaxis: Eliquis Code Status: Full Family Communication: No family at bedside  Disposition Plan: Home Consults called: Neurology/ Woodroe Chen ANP-BC [facility] Pager [phone]   If 7PM-7AM, please contact night-coverage [email or web address] Password [facility]  05/12/2017, 9:44 AM

## 2017-05-12 NOTE — Consult Note (Addendum)
Neurology Consultation  Reason for Consult: Acute code stroke Referring Physician: Dr. Stark Jock  CC: Left-sided twitching  History is obtained from: Patient, wife, EMS  HPI: Nathan Frank is a 76 y.o. male who has a past medical history of hepatitis C, a call abuse, nonischemic or a myopathy, stroke with residual left hemiparesis, hypothyroidism, history of medicine noncompliance per chart, atrial fibrillation on Eliquis per chart, who was in his usual state of health until this evening when his wife started noticing that his left face arm and leg twitching. 911 was called and an acute code stroke was activated and the patient was brought into the emergency room. The patient was able to answer most questions appropriately. He had a leftward gaze with left facial droop, left facial twitching, left arm twitching and left leg twitching. Strength on the right side of the time exam was normal. He was given Ativan 1 mg 2 at the bridge of the ER. He was then taken for a stat noncontrast CT of the head which was negative. The Ativan did not break his seizure, Keppra 1 g IV load was ordered and given followed by fosphenytoin 1200 mg load. He continued to seize through the Ativan, Keppra and fosphenytoin. His mental status remained good throughout this process. He was awake, answering all questions, oriented to place person and time. His wife was by his bedside and was a very poor historian. The rapid response nurse called the son, who notified us that the wife has dementia herself and tends to forget things. The patient denied of having been having fevers chills chest pain shortness of breath nausea and loss of appetite. He denied any urinary symptoms.  LKW: 12:30 AM tpa given?: no, simple partial seizure, maybe a epilepsia partialis continua Premorbid modified Rankin scale (mRS): 3   ROS: A 14 point ROS was performed and is negative except as noted in the HPI.   Past Medical History:  Diagnosis  Date  . Abnormal TSH   . Acute gastric ulcer   . Acute gastritis with hemorrhage   . Alcohol abuse   . Alcohol dependence ([place]) 02/02/2014  . Anemia   . Bifascicular block   . Cirrhosis ([place]) 06/04/2012  . Depressive disorder 02/01/2014  . Dizziness and giddiness 12/13/2014  . DVT of lower limb, acute ([place]) 06/06/2012  . DVT, lower extremity, recurrent ([place])    a. noted 2013. b. also dx 06/2016.  Marland Kitchen Essential hypertension   . Fatty liver   . Gallstones   . Gastritis 06/07/2012  . GERD (gastroesophageal reflux disease)   . GI bleed due to NSAIDs 12/13/2014  . Granulomatous gastritis   . Hematuria 06/04/2012  . Hepatitis C   . Hepatocellular carcinoma ([place])   . Heroin abuse    "I haven't done that since I don't know when."  . Heroin overdose 02/20/2014  . Neuropathy   . NICM (nonischemic cardiomyopathy) ([place])    a. 04/2015: EF 45-50% by cath. b. EF 40-45% by echo 06/2016.  . NSTEMI (non-ST elevated myocardial infarction) ([place])    a. 04/2015 - patent coronaries. Etiology possibly due to coronary spasm versus embolus, stress cardiomyopathy (atypical), and aborted infarction related to plaque rupture with thrombosis and dissolution. Amlodipine started. Not on antiplatelets due to GIB/cirrhosis history.  . Oral thrush 06/05/2012  . Polysubstance abuse    THC, alcohol, heroin  . Prolonged Q-T interval on ECG    a. 12/2014 - treated with magnesium.  . Right knee pain 12/13/2014  .  S/P alcohol detoxification 02/02/2014  . Stroke ([place]) 06/2016  . SVT (supraventricular tachycardia) ([place])    a. 12/2014 in setting of GIB, ETOH, NSAIDS, gastritis.  . Symptomatic cholelithiasis 12/15/2013  . Thrombocytopenia ([place])   . Upper GI bleeding 12/13/2014  . Weight loss 06/04/2012    Family History  Problem Relation Age of Onset  . Stroke Father   . Prostate cancer Brother   . Heart disease Mother        Pacemaker  . Diabetes Neg Hx      Social History:   reports that he has quit smoking. His smoking use  included Cigarettes. He has a 2.50 pack-year smoking history. He has never used smokeless tobacco. He reports that he does not drink alcohol or use drugs.  He has documented history of polysubstance abuse. Also of alcohol abuse.  Medications No current facility-administered medications for this encounter.   Current Outpatient Prescriptions:  .  apixaban (ELIQUIS) 5 MG TABS tablet, Take 1 tablet (5 mg total) by mouth 2 (two) times daily., Disp: 180 tablet, Rfl: 3 .  folic acid (FOLVITE) 1 MG tablet, Take 1 mg by mouth daily., Disp: , Rfl: 0 .  levothyroxine (SYNTHROID, LEVOTHROID) 50 MCG tablet, Take 1 tablet by mouth daily before breakfast., Disp: , Rfl:  .  magnesium oxide (MAG-OX) 400 (241.3 Mg) MG tablet, Take 1 tablet (400 mg total) by mouth daily., Disp: 90 tablet, Rfl: 3 .  Melatonin 5 MG CAPS, Take 1 capsule by mouth at bedtime., Disp: , Rfl:  .  metoprolol tartrate (LOPRESSOR) 25 MG tablet, Take 0.5 tablets (12.5 mg total) by mouth 2 (two) times daily., Disp: 30 tablet, Rfl: 2 .  midodrine (PROAMATINE) 5 MG tablet, 5 mg three times a day for 1 wk, then 5 mg twice a day (8am/2pm) for 2 wks, then 2.5 mg twice a day for 3 wks, then stop, Disp: 70 tablet, Rfl: 0 .  VITAMIN D, ERGOCALCIFEROL, PO, Take 500 mg by mouth daily., Disp: , Rfl:   Exam: Current vital signs: BP 111/68   Pulse (!) 122   Temp 97.6 F (36.4 C)   Resp (!) 29   SpO2 97%  Vital signs in last 24 hours: Temp:  [97.6 F (36.4 C)] 97.6 F (36.4 C) (08/08 0215) Pulse Rate:  [105-122] 122 (08/08 0230) Resp:  [25-29] 29 (08/08 0230) BP: (111-124)/(68-85) 111/68 (08/08 0230) SpO2:  [91 %-97 %] 97 % (08/08 0230)  GENERAL: Awake, alert in NAD HEENT: - Normocephalic and atraumatic, dry mm, no LN++, no Thyromegally LUNGS - Clear to auscultation bilaterally with no wheezes CV - S1S2 RRR, no m/r/g, equal pulses bilaterally. ABDOMEN - Soft, nontender, nondistended with normoactive BS Ext: warm, well perfused, intact  peripheral pulses, no edema  NEURO:  Mental Status: AA&Ox3. Answering most questions appropriately. Following all commands. Constant twitching of the left hemi-face, left arm and left leg. Language: speech is very dysarthric.  Naming, repetition, fluency, and comprehension intact. Cranial Nerves: PERRL 76mm/brisk. He has a left forced gaze which she is unable to overcome, visual fields full to threat, he has left lower facial weakness, facial sensation intact, hearing intact, tongue/uvula/soft palate midline, normal sternocleidomastoid and trapezius muscle strength. No evidence of tongue atrophy or fibrillations Motor: 5/5 right upper and right lower extremity. Constantly twitching left arm and leg. Unable to move left arm and leg on command. Tone: Increased on the left upper and lower extremity. Normal on the right. Sensation- intact to touch on the  right, decreased to touch on the left Coordination: Unable to perform finger to nose or heel knee shin on the left, intact on the right Gait- deferred   Labs I have reviewed labs in epic and the results pertinent to this consultation are:  CBC    Component Value Date/Time   WBC 5.5 05/12/2017 0155   RBC 4.25 05/12/2017 0155   HGB 13.6 05/12/2017 0203   HGB 13.4 01/01/2017 1535   HCT 40.0 05/12/2017 0203   HCT 38.9 01/01/2017 1535   PLT 132 (L) 05/12/2017 0155   PLT 148 (L) 01/01/2017 1535   MCV 87.3 05/12/2017 0155   MCV 94 01/01/2017 1535   MCH 30.4 05/12/2017 0155   MCHC 34.8 05/12/2017 0155   RDW 16.4 (H) 05/12/2017 0155   RDW 14.4 01/01/2017 1535   LYMPHSABS 3.1 05/12/2017 0155   MONOABS 0.6 05/12/2017 0155   EOSABS 0.1 05/12/2017 0155   BASOSABS 0.0 05/12/2017 0155    CMP     Component Value Date/Time   NA 147 (H) 05/12/2017 0203   NA 144 01/01/2017 1535   K 4.3 05/12/2017 0203   CL 110 05/12/2017 0203   CO2 23 05/12/2017 0155   GLUCOSE 97 05/12/2017 0203   BUN 13 05/12/2017 0203   BUN 6 (L) 01/01/2017 1535    CREATININE 0.70 05/12/2017 0203   CREATININE 0.72 12/13/2015 1226   CALCIUM 8.4 (L) 05/12/2017 0155   PROT 6.3 (L) 05/12/2017 0155   PROT 7.1 01/01/2017 1535   ALBUMIN 2.8 (L) 05/12/2017 0155   ALBUMIN 2.9 (L) 01/01/2017 1535   AST 50 (H) 05/12/2017 0155   ALT 17 05/12/2017 0155   ALKPHOS 68 05/12/2017 0155   BILITOT 1.4 (H) 05/12/2017 0155   BILITOT 1.2 01/01/2017 1535   GFRNONAA >60 05/12/2017 0155   GFRAA >60 05/12/2017 0155    Lipid Panel     Component Value Date/Time   CHOL 87 06/26/2016 0500   TRIG 91 06/27/2016 0219   HDL 26 (L) 06/26/2016 0500   CHOLHDL 3.3 06/26/2016 0500   VLDL 23 06/26/2016 0500   LDLCALC 38 06/26/2016 0500     Imaging I have reviewed the images obtained:  CT-scan of the brainWas performed as a part of the code stroke protocol. Severely motion degraded. Evidence of old right MCA territory infarct and also left MCA territory infarct. Appearance consistent with multiple embolic events in the past. No acute changes visible in the motion degraded study. No evidence of bleed.   Assessment:  76 year old man with multiple vascular risk factors, old stroke in the right MCA territory involving the right temporal lobe as well as strokes in the left MCA territory, presented as an acute code stroke for left facial, arm and leg twitching. He was noted to have simple partial seizure involving the left face arm and leg. He was given Ativan 1 mg 2, Keppra 1 g load as well as fosphenytoin 1200 mg load with minimal response to the medications and continued to have seizure through it. His seizure activity eventually broke after the infusion completion of the fosphenytoin. This is less likely a new stroke and more likely seizure from his old right MCA territory stroke.  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 -Keppra 500 twice a day as maintenance antiepileptic therapy -EEG at some point in the morning. -MRI with and without contrast -Seizure  precautions Neurology will follow with you.  -- Amie Portland, MD [facility] [phone]  If 7pm to 7am, please call on call as listed on AMION.   Critical care attestation This patient is critically ill and at significant risk of neurological worsening, death and care requires constant monitoring of vital signs, hemodynamics,respiratory and cardiac monitoring, neurological assessment, discussion with family, other specialists and medical decision making of high complexity. I spent 55  minutes of neurocritical care time  in the care of  this patient.

## 2017-05-12 NOTE — ED Provider Notes (Signed)
[facility] DEPT Provider Note   CSN: [id number] Arrival date & time: 05/12/17  0148     History   Chief Complaint No chief complaint on file.   HPI Nathan Frank is a 76 y.o. male.  Patient is a 76 year old male with past mental history of prior CVA with left-sided hemiparesis presenting for evaluation of shaking of the left arm, left leg, and left face. This began suddenly this evening. The patient denies any headache, fevers, or other complaints. Wife called 911 and the patient was transported here. He denies any injury or trauma.   The history is provided by the patient.    Past Medical History:  Diagnosis Date  . Abnormal TSH   . Acute gastric ulcer   . Acute gastritis with hemorrhage   . Alcohol abuse   . Alcohol dependence ([place]) 02/02/2014  . Anemia   . Bifascicular block   . Cirrhosis ([place]) 06/04/2012  . Depressive disorder 02/01/2014  . Dizziness and giddiness 12/13/2014  . DVT of lower limb, acute ([place]) 06/06/2012  . DVT, lower extremity, recurrent ([place])    a. noted 2013. b. also dx 06/2016.  Marland Kitchen Essential hypertension   . Fatty liver   . Gallstones   . Gastritis 06/07/2012  . GERD (gastroesophageal reflux disease)   . GI bleed due to NSAIDs 12/13/2014  . Granulomatous gastritis   . Hematuria 06/04/2012  . Hepatitis C   . Hepatocellular carcinoma ([place])   . Heroin abuse    "I haven't done that since I don't know when."  . Heroin overdose 02/20/2014  . Neuropathy ([place])   . NICM (nonischemic cardiomyopathy) ([place])    a. 04/2015: EF 45-50% by cath. b. EF 40-45% by echo 06/2016.  . NSTEMI (non-ST elevated myocardial infarction) ([place])    a. 04/2015 - patent coronaries. Etiology possibly due to coronary spasm versus embolus, stress cardiomyopathy (atypical), and aborted infarction related to plaque rupture with thrombosis and dissolution. Amlodipine started. Not on antiplatelets due to GIB/cirrhosis history.  . Oral thrush 06/05/2012  . Polysubstance abuse    THC, alcohol,  heroin  . Prolonged Q-T interval on ECG    a. 12/2014 - treated with magnesium.  . Right knee pain 12/13/2014  . S/P alcohol detoxification 02/02/2014  . Stroke ([place]) 06/2016  . SVT (supraventricular tachycardia) ([place])    a. 12/2014 in setting of GIB, ETOH, NSAIDS, gastritis.  . Symptomatic cholelithiasis 12/15/2013  . Thrombocytopenia ([place])   . Upper GI bleeding 12/13/2014  . Weight loss 06/04/2012    Patient Active Problem List   Diagnosis Date Noted  . AKI (acute kidney injury) ([place])   . Demand ischemia ([place])   . NICM (nonischemic cardiomyopathy) ([place])   . Syncope   . Atrial fibrillation with RVR ([place]) 11/12/2016  . ARF (acute renal failure) ([place]) 11/12/2016  . Failure to thrive in adult   . Cellulitis of left knee 08/20/2016  . Back injury 08/20/2016  . Chronic systolic CHF (congestive heart failure) ([place]) 08/20/2016  . Atrial fibrillation with rapid ventricular response ([place]) 08/06/2016  . Acute blood loss anemia   . Lymphocytosis   . Atrial tachycardia ([place])   . Acute ischemic right MCA stroke ([place]) 07/04/2016  . Obesity 07/01/2016  . Family hx-stroke 07/01/2016  . Dysphagia, post-stroke   . Dysarthria, post-stroke   . Polysubstance abuse   . Chronic combined systolic and diastolic CHF (congestive heart failure) ([place])   . Mitral valve regurgitation   . Anemia of chronic disease   .  Cerebral infarction due to embolism of right middle cerebral artery ([place]) - s/p mechanical thrombectomy 06/25/2016  . Acute on chronic diastolic CHF (congestive heart failure) ([place]) 06/09/2016  . Alcoholic cirrhosis of liver without ascites ([place])   . Other specified hypothyroidism   . Hepatocellular carcinoma ([place])   . Generalized weakness 05/27/2016  . Hypotension 04/18/2016  . Chronic atrial fibrillation ([place]) 04/18/2016  . Prolonged Q-T interval on ECG   . Neuropathy   . Essential hypertension   . Thrombocytopenia ([place]) 04/16/2015  . Abnormal TSH 04/16/2015  . NSTEMI (non-ST elevated  myocardial infarction) ([place])   . Acute gastritis with hemorrhage   . GI bleed due to NSAIDs 12/13/2014  . Upper GI bleeding 12/13/2014  . Alcohol dependence ([place]) 02/02/2014  . Depressive disorder 02/01/2014  . Symptomatic cholelithiasis 12/15/2013  . DVT of lower limb, acute ([place]) 06/06/2012  . Alcohol abuse 06/04/2012  . Liver cirrhosis ([place]) 06/04/2012    Past Surgical History:  Procedure Laterality Date  . CARDIAC CATHETERIZATION N/A 04/15/2015   Procedure: Left Heart Cath and Coronary Angiography;  Surgeon: Belva Crome, MD;  Location: [facility];  Service: Cardiovascular;  Laterality: N/A;  . CIRCUMCISION    . ESOPHAGOGASTRODUODENOSCOPY  06/07/2012   Procedure: ESOPHAGOGASTRODUODENOSCOPY (EGD);  Surgeon: Milus Banister, MD;  Location: [facility];  Service: Endoscopy;  Laterality: N/A;  may need treatment of varices  . ESOPHAGOGASTRODUODENOSCOPY N/A 12/14/2014   Procedure: ESOPHAGOGASTRODUODENOSCOPY (EGD);  Surgeon: Jerene Bears, MD;  Location: [facility]OSCOPY;  Service: Endoscopy;  Laterality: N/A;  . IR GENERIC HISTORICAL  06/25/2016   IR US GUIDE VASC ACCESS LEFT 06/25/2016 Corrie Mckusick, DO [facility]  . IR GENERIC HISTORICAL  06/25/2016   IR RADIOLOGY PERIPHERAL GUIDED IV START 06/25/2016 Corrie Mckusick, DO [facility]  . IR GENERIC HISTORICAL  06/25/2016   IR PERCUTANEOUS ART THROMBECTOMY/INFUSION INTRACRANIAL INC DIAG ANGIO 06/25/2016 Luanne Bras, MD [facility]  . RADIOLOGY WITH ANESTHESIA N/A 06/25/2016   Procedure: RADIOLOGY WITH ANESTHESIA;  Surgeon: Luanne Bras, MD;  Location: [facility];  Service: Radiology;  Laterality: N/A;  . TONSILLECTOMY         Home Medications    Prior to Admission medications   Medication Sig Start Date End Date Taking? Authorizing Provider  apixaban (ELIQUIS) 5 MG TABS tablet Take 1 tablet (5 mg total) by mouth 2 (two) times daily. 01/25/17   Fay Records, MD  folic acid (FOLVITE) 1 MG tablet Take 1 mg by mouth daily. 12/03/16    [provider]  levothyroxine (SYNTHROID, LEVOTHROID) 50 MCG tablet Take 1 tablet by mouth daily before breakfast. 12/29/16   [provider]  magnesium oxide (MAG-OX) 400 (241.3 Mg) MG tablet Take 1 tablet (400 mg total) by mouth daily. 01/25/17   Fay Records, MD  Melatonin 5 MG CAPS Take 1 capsule by mouth at bedtime.    [provider]  metoprolol tartrate (LOPRESSOR) 25 MG tablet Take 0.5 tablets (12.5 mg total) by mouth 2 (two) times daily. 11/15/16 12/15/16  Reyne Dumas, MD  midodrine (PROAMATINE) 5 MG tablet 5 mg three times a day for 1 wk, then 5 mg twice a day (8am/2pm) for 2 wks, then 2.5 mg twice a day for 3 wks, then stop 04/02/17   Fay Records, MD  VITAMIN D, ERGOCALCIFEROL, PO Take 500 mg by mouth daily.    [provider]    Family History Family History  Problem Relation Age of Onset  .  Stroke Father   . Prostate cancer Brother   . Heart disease Mother        Pacemaker  . Diabetes Neg Hx     Social History Social History  Substance Use Topics  . Smoking status: Former Smoker    Packs/day: 0.50    Years: 5.00    Types: Cigarettes  . Smokeless tobacco: Never Used     Comment: "quit smoking cigarettes in the 1970's"  . Alcohol use No     Comment: 06/2016 " I quit drinking a couple of weeks ago "     Allergies   Nsaids and Oxycodone   Review of Systems Review of Systems  All other systems reviewed and are negative.    Physical Exam Updated Vital Signs There were no vitals taken for this visit.  Physical Exam  Constitutional: He appears well-developed and well-nourished. No distress.  HENT:  Head: Normocephalic and atraumatic.  Mouth/Throat: Oropharynx is clear and moist.  Neck: Normal range of motion. Neck supple.  Cardiovascular: Normal rate and regular rhythm.  Exam reveals no friction rub.   No murmur heard. Pulmonary/Chest: Effort normal and breath sounds normal. No respiratory distress. He has no wheezes.  He has no rales.  Abdominal: Soft. Bowel sounds are normal. He exhibits no distension. There is no tenderness.  Musculoskeletal: Normal range of motion. He exhibits no edema.  Neurological: He is alert.  Patient is awake and alert and responds to questions appropriately. He has rhythmic contractions of the left face, left upper extremity, and left leg. His eyes are drawn to the left.  Skin: Skin is warm and dry. He is not diaphoretic.  Nursing note and vitals reviewed.    ED Treatments / Results  Labs (all labs ordered are listed, but only abnormal results are displayed) Labs Reviewed  PROTIME-INR  APTT  CBC  DIFFERENTIAL  COMPREHENSIVE METABOLIC PANEL  I-STAT TROPONIN, ED  CBG MONITORING, ED  I-STAT CHEM 8, ED    EKG  EKG Interpretation None       Radiology No results found.  Procedures Procedures (including critical care time)  Medications Ordered in ED Medications  LORazepam (ATIVAN) injection 1 mg (not administered)  levETIRAcetam (KEPPRA) 1,000 mg in sodium chloride 0.9 % 100 mL IVPB (not administered)  fosPHENYtoin (CEREBYX) 1,200 mg PE in sodium chloride 0.9 % 50 mL IVPB (not administered)     Initial Impression / Assessment and Plan / ED Course  I have reviewed the triage vital signs and the nursing notes.  Pertinent labs & imaging results that were available during my care of the patient were reviewed by me and considered in my medical decision making (see chart for details).  Patient with history of prior CVA presenting with complaints of jerking of the left arm and leg and left side of face. He arrived here as a code stroke, however appears to be experiencing a focal seizure, not an ischemic stroke. His head CT is negative. He was given several doses of Ativan along with Keppra and Dilantin in consultation with Dr. Lorraine Lax from Neurology.   Workup is otherwise unremarkable. He will be admitted to the hospitalist service under the care of Dr.  Alcario Drought.  Final Clinical Impressions(s) / ED Diagnoses   Final diagnoses:  None    New Prescriptions New Prescriptions   No medications on file     Veryl Speak, MD 05/12/17 [phone]

## 2017-05-12 NOTE — Progress Notes (Signed)
No further seizures, MRI positive for diffusion change, although this is cortically based, and I wonder if these could be postictal changes, but no deep structures are affected as can be seen with postictal changes. Agree with pursuing embolic stroke workup, but also continue antiepileptic therapy with Keppra 1 g twice a day  Roland Rack, MD [facility] [phone]  If 7pm- 7am, please page neurology on call as listed in [place].

## 2017-05-12 NOTE — ED Notes (Signed)
Pt son Graeden Bitner [phone]

## 2017-05-12 NOTE — Progress Notes (Signed)
Patient admitted to room 63M-10.  Patient alert with no complaints.  Patient educated on fall risk safety.  Call light within reach

## 2017-05-12 NOTE — ED Notes (Signed)
Pt placed on 2 L o2.

## 2017-05-12 NOTE — ED Triage Notes (Signed)
Pt comes via [facility] for L sided facial twitching and L arm and L twitching. Pt also c/o of central CP. LSN ar 0020. Hx of previous stroke with L sided deficits.

## 2017-05-12 NOTE — ED Notes (Signed)
Pt passed swallow screen. Heart healthy diet tray ordered for lunch

## 2017-05-12 NOTE — Procedures (Signed)
ELECTROENCEPHALOGRAM REPORT  Date of Study: 05/12/2017  Patient's Name: Nathan Frank MRN: [id number] Date of Birth: [birth year]  Referring Provider: Dr. Amie Portland  Clinical History: This is a 76 year old man with left-sided shaking.   Medications: levETIRAcetam (KEPPRA) 500 mg in sodium chloride 0.9 % 100 mL IVPB  aspirin tablet 325 mg  levothyroxine (SYNTHROID, LEVOTHROID) injection 25 mcg  metoprolol tartrate (LOPRESSOR) injection 5 mg   Technical Summary: A multichannel digital EEG recording measured by the international 10-20 system with electrodes applied with paste and impedances below 5000 ohms performed in our laboratory with EKG monitoring in an awake and drowsy patient.  Hyperventilation and photic stimulation were not performed.  The digital EEG was referentially recorded, reformatted, and digitally filtered in a variety of bipolar and referential montages for optimal display.    Description: The patient is awake and drowsy during the recording.  During maximal wakefulness, there is an asymmetric, medium voltage 8-8.5 Hz posterior dominant rhythm better formed over the left occipital region that attenuates with eye opening.  There is suppression of faster frequencies over the right temporal region. During drowsiness, there is an increase in theta slowing of the background.  Deeper stages of sleep were not seen. Hyperventilation and photic stimulation were not performed.  There were no epileptiform discharges or electrographic seizures seen.    EKG lead showed irregular rhythm.  Impression: This awake and asleep EEG is abnormal due to focal suppression over the right temporal region.  Clinical Correlation of the above findings indicates focal cerebral dysfunction over the right temporal region suggestive of underlying structural or physiologic abnormality. The absence of epileptiform discharges does not exclude a clinical diagnosis of epilepsy. Clinical correlation is  advised.   Ellouise Newer, M.D.

## 2017-05-12 NOTE — Progress Notes (Signed)
EEG completed, results pending. 

## 2017-05-12 NOTE — ED Notes (Signed)
See epic downtime notes. Pt was hourly rounded on. Neuro checks performed. No changes. Pt alert and oriented. Pt also had lunch tray.

## 2017-05-13 ENCOUNTER — Encounter (HOSPITAL_COMMUNITY): Payer: Self-pay

## 2017-05-13 ENCOUNTER — Inpatient Hospital Stay (HOSPITAL_COMMUNITY): Payer: Medicare Other

## 2017-05-13 DIAGNOSIS — I340 Nonrheumatic mitral (valve) insufficiency: Secondary | ICD-10-CM

## 2017-05-13 DIAGNOSIS — I361 Nonrheumatic tricuspid (valve) insufficiency: Secondary | ICD-10-CM

## 2017-05-13 LAB — CBC
HCT: 36.6 % — ABNORMAL LOW (ref 39.0–52.0)
HEMOGLOBIN: 12.6 g/dL — AB (ref 13.0–17.0)
MCH: 30.4 pg (ref 26.0–34.0)
MCHC: 34.4 g/dL (ref 30.0–36.0)
MCV: 88.2 fL (ref 78.0–100.0)
PLATELETS: 97 10*3/uL — AB (ref 150–400)
RBC: 4.15 MIL/uL — ABNORMAL LOW (ref 4.22–5.81)
RDW: 16.7 % — ABNORMAL HIGH (ref 11.5–15.5)
WBC: 3.1 10*3/uL — ABNORMAL LOW (ref 4.0–10.5)

## 2017-05-13 LAB — BASIC METABOLIC PANEL
Anion gap: 11 (ref 5–15)
BUN: 8 mg/dL (ref 6–20)
CALCIUM: 8 mg/dL — AB (ref 8.9–10.3)
CO2: 20 mmol/L — AB (ref 22–32)
CREATININE: 0.78 mg/dL (ref 0.61–1.24)
Chloride: 111 mmol/L (ref 101–111)
GFR calc Af Amer: >60 mL/min (ref >60)
GFR calc non Af Amer: >60 mL/min (ref >60)
GLUCOSE: 82 mg/dL (ref 65–99)
Potassium: 3.4 mmol/L — ABNORMAL LOW (ref 3.5–5.1)
Sodium: 142 mmol/L (ref 135–145)

## 2017-05-13 LAB — HEMOGLOBIN A1C
HEMOGLOBIN A1C: 4.4 % — AB (ref 4.8–5.6)
MEAN PLASMA GLUCOSE: 79.58 mg/dL

## 2017-05-13 LAB — LIPID PANEL
CHOLESTEROL: 101 mg/dL (ref 0–200)
HDL: 33 mg/dL — AB (ref >40)
LDL Cholesterol: 57 mg/dL (ref 0–99)
Total CHOL/HDL Ratio: 3.1 RATIO
Triglycerides: 53 mg/dL (ref <150)
VLDL: 11 mg/dL (ref 0–40)

## 2017-05-13 LAB — ECHOCARDIOGRAM COMPLETE
HEIGHTINCHES: 71 in
Weight: 2899.49 oz

## 2017-05-13 MED ORDER — PERFLUTREN LIPID MICROSPHERE
INTRAVENOUS | Status: AC
  Administered 2017-05-13: 11:00:00
  Filled 2017-05-13: qty 10

## 2017-05-13 NOTE — Progress Notes (Signed)
STROKE TEAM PROGRESS NOTE   HISTORY OF PRESENT ILLNESS (per record) Nathan Frank is a 76 y.o. male who has a past medical history of hepatitis C, heroin use and overdose, history of ETOH dependence (current drinker: "one glass of wine daily"), nonischemic cardiomyopathy, stroke with residual left hemiparesis, hypothyroidism, history of medicine noncompliance per chart, and atrial fibrillation on Eliquis who presented with left facial droop, leftward gaze, and left arm and leg twitching.  He was in his usual state of health until the evening of 05/12/2017 when his wife noticed his left face arm and leg twitching and called 911.  In [facility], patient was able to answer most questions appropriately. He had a leftward gaze with left facial droop, left facial twitching, left arm twitching and left leg twitching. Strength on the right side of the time exam was normal.  He was given Ativan 1 mg 2 at the bridge of the ER. He was then taken for a stat noncontrast CT of the head which was negative.  The Ativan did not break his seizure, Keppra 1 g IV load was ordered and given followed by fosphenytoin 1200 mg load.  He continued to seize through the Ativan, Keppra and fosphenytoin.  His mental status remained good throughout this process. He was awake, answering all questions, oriented to place person and time.  His wife was by his bedside and was a very poor historian. The rapid response nurse called the son, who notified the neurohospitalist that the wife has dementia herself and tends to forget things.  The patient denied fevers chills chest pain, dyspnea, nausea and loss of appetite. He denied any urinary symptoms.  LKW: 12:30 AM on 05/13/2017 Premorbid modified Rankin scale (mRS): 3  Patient was not administered IV t-PA secondary to simple partial seizure/being on oral anticoagluation.  He was admitted to General Neurology for further evaluation and treatment.   SUBJECTIVE (INTERVAL HISTORY) He is alone in the  room.  The patient is awake and mildly confused, but follows all commands appropriately.  The patient reports good medication adherence with his anticoagulation.  Urine tox negative.  No further recurrent seizures since admission.  OBJECTIVE Temp:  [97.7 F (36.5 C)-98.7 F (37.1 C)] 98.7 F (37.1 C) (08/09 1408) Pulse Rate:  [71-96] 74 (08/09 1408) Cardiac Rhythm: Atrial fibrillation (08/09 0703) Resp:  [8-16] 16 (08/09 1408) BP: (103-150)/(62-102) 103/64 (08/09 1408) SpO2:  [94 %-100 %] 98 % (08/09 1408) Weight:  [82.2 kg (181 lb 3.5 oz)] 82.2 kg (181 lb 3.5 oz) (08/08 2046)  CBC:   Recent Labs Lab 05/12/17 0155 05/12/17 0203 05/13/17 0700  WBC 5.5  --  3.1*  NEUTROABS 1.7  --   --   HGB 12.9* 13.6 12.6*  HCT 37.1* 40.0 36.6*  MCV 87.3  --  88.2  PLT 132*  --  97*    Basic Metabolic Panel:   Recent Labs Lab 05/12/17 1041 05/13/17 0700  NA 143 142  K 4.1 3.4*  CL 113* 111  CO2 21* 20*  GLUCOSE 90 82  BUN 11 8  CREATININE 0.73 0.78  CALCIUM 8.1* 8.0*    Lipid Panel:     Component Value Date/Time   CHOL 101 05/13/2017 0700   TRIG 53 05/13/2017 0700   HDL 33 (L) 05/13/2017 0700   CHOLHDL 3.1 05/13/2017 0700   VLDL 11 05/13/2017 0700   LDLCALC 57 05/13/2017 0700   HgbA1c:  Lab Results  Component Value Date   HGBA1C 4.4 (L) 05/13/2017  Urine Drug Screen:     Component Value Date/Time   LABOPIA NONE DETECTED 05/12/2017 1401   COCAINSCRNUR NONE DETECTED 05/12/2017 1401   COCAINSCRNUR NEGATIVE 12/13/2014 2353   LABBENZ NONE DETECTED 05/12/2017 1401   LABBENZ POSITIVE (A) 12/13/2014 2353   AMPHETMU NONE DETECTED 05/12/2017 1401   THCU NONE DETECTED 05/12/2017 1401   LABBARB NONE DETECTED 05/12/2017 1401    Alcohol Level     Component Value Date/Time   ETH <5 05/12/2017 1040    IMAGING  Ct Angio Head W Or Wo Contrast Ct Angio Neck W Or Wo Contrast 05/12/2017 IMPRESSION: 1. Mild atherosclerotic changes at the right carotid bifurcation without  significant stenosis. 2. ICA tortuosity is worse on the right. 3. Mild irregularity of the cervical internal carotid artery is bilaterally raises possibility of FMD. 4. Atherosclerotic calcifications within the cavernous internal carotid arteries bilaterally without significant stenosis.   Mr Nathan Frank Wo Contrast 05/12/2017 IMPRESSION: Small foci of acute infarct in the right parietal lobe, left parietal lobe, and left frontal lobe. Question emboli Atrophy and chronic right MCA infarct and chronic left parietal infarct.   Ct Head Code Stroke Wo Contrast 05/12/2017   IMPRESSION: 1. No acute intracranial process on this moderate to severely motion degraded examination. 2. Stable examination including bilateral MCA territory infarcts. 3. ASPECTS is 10.  TTE 05/13/2017 pending   PHYSICAL EXAM Frail cachectic elderly African-American male currently not in distress. . Afebrile. Head is nontraumatic. Neck is supple without bruit.    Cardiac exam no murmur or gallop. Lungs are clear to auscultation. Distal pulses are well felt. Neurological Exam ;  Awake alert oriented 2. No visible focal seizures. Extraocular moments are full range without nystagmus. Blinks to threat bilaterally. Vision acuity seems adequate. Fundi were not visualized. Mild left lower facial weakness. Tongue midline. Mild left hemiparesis 4+/5 weakness of left grip and intrinsic hand muscles. Orbits right over left upper extremity. Fine finger movements are diminished on the left. Deep tendon reflexes are symmetric. Plantars are downgoing. Gait was not tested. Sensation appears preserved bilaterally. ASSESSMENT/PLAN [name] is a 76 y.o. male with history of hepatitis C, ETOH abuse, nonischemic cardiomyopathy, stroke with residual left hemiparesis, hypothyroidism, history of medicine noncompliance per chart, and atrial fibrillation on Eliquis who presented with left facial droop, leftward gaze, and left arm and leg twitching. He  did not receive IV t-PA due to simple partial seizure/being on oral anticoagluation.   Stroke:  Small foci of acute infarct in the right parietal lobe, left parietal lobe, and left frontal lobe, likely cardioembolic in the setting of atrial fibrillation on Eliquis, ischemic cardiomyopathy, and prior stroke.  Resultant  confusion and left hemiparesis  CT head: no acute stroke  MRI head: Small foci of acute infarct in the right parietal lobe, left parietal lobe, and left frontal lobe  MRA head  not ordered  CTA head/neck: no LVO or high-grade stenosis  2D Echo  pending   LDL 57  HgbA1c 4.4  SCDs for VTE prophylaxis Diet Heart Room service appropriate? Yes; Fluid consistency: Thin  Eliquis (apixaban) daily prior to admission, now on aspirin 81 mg daily and Eliquis (apixaban) daily  Patient counseled to be compliant with his antithrombotic medications  Ongoing aggressive stroke risk factor management  Therapy recommendations: SNF  Disposition:   pending  Hypertension  Stable Permissive hypertension (OK if < 220/120) but gradually normalize in 5-7 days Long-term BP goal normotensive  Other Stroke Risk Factors  Advanced age  ETOH use, advised to drink no more than 2 drink(s) a day  Hx stroke/TIA  Family hx stroke (Father)  Coronary artery disease  Ischemic cardiomyopathy  Atrial firbrillation  Other Active Problems Hepatitis C: HCV ab > 11.0, ALT 17, AST 50, PLT 97, T bili 1.4 Thrombocytopenia, chronic: PLT 97  Hospital day # 1  I have personally examined this patient, reviewed notes, independently viewed imaging studies, participated in medical decision making and plan of care.ROS completed by me personally and pertinent positives fully documented  I have made any additions or clarifications directly to the above note. Agree with note above.  He has presented with focal seizure status secondary to recurrent by cerebral embolic infarcts from his atrial  fibrillation despite anticoagulation with eliquis.Considering switching patient from Eliquis to Pradaxa  150mg  PO BID since the patient has had recurrent cardioembolic stroke while taking Eliquis.  Case Management contacted to determine that patient can afford co-pay for Pradaxa. Continue Keppra for seizures Discuss with Dr. Erlinda Hong and answered questions. Greater than 50% time during this 35 minute visit was spent on counseling and coordination of care about his recurrent embolic strokes, discussion about choices for anticoagulation, risk-benefit and answered questions   Antony Contras, MD Medical Director [place] Pager: [phone] 05/13/2017 4:54 PM   To contact Stroke Continuity provider, please refer to [email or web address] After hours, contact General Neurology

## 2017-05-13 NOTE — Evaluation (Signed)
Occupational Therapy Evaluation Patient Details Name: Nathan Frank MRN: [id number] DOB: 10-26-1940 Today's Date: 05/13/2017    History of Present Illness Pt is a 76 y/o male admitted secondary to new onset seizures with left face, arm and leg were twitching. PMH including but not limited to alcohol abuse, HTN, Hep C and hx of CVA (R MCA and right frontal left parietal infarcts)   Clinical Impression   This 76 yo male admitted and underwent above presents to acute OT with deficits below (see OT problem list) thus affecting what pt reports as his PLOF of being able to usually do all his basic ADLs by himself. He will benefit from acute OT with follow up OT at SNF to work back toward PLOF.    Follow Up Recommendations  SNF;Supervision/Assistance - 24 hour    Equipment Recommendations  Other (comment) (TBD at next venue)       Precautions / Restrictions Precautions Precautions: Fall Precaution Comments: seizure      Mobility Bed Mobility Overal bed mobility: Needs Assistance Bed Mobility: Supine to Sit;Sit to Supine     Supine to sit: Mod assist Sit to supine: Mod assist   General bed mobility comments: increased time and effort, use of bed pads to position pt's hips at EOB, assist with LEs to return to bed  Transfers Overall transfer level: Needs assistance Equipment used: 1 person hand held assist Transfers: Sit to/from Stand Sit to Stand: Mod assist         General transfer comment: Pt able to take 5 steps forward and back first with RW (once I was able to help him get his LUE on RW) then second time with me in front of him with belt and him holding my LUE    Balance Overall balance assessment: Needs assistance Sitting-balance support: No upper extremity supported;Feet supported Sitting balance-Leahy Scale: Fair     Standing balance support: During functional activity;Bilateral upper extremity supported Standing balance-Leahy Scale: Poor Standing balance  comment: Mod A                           ADL either performed or assessed with clinical judgement   ADL Overall ADL's : Needs assistance/impaired Eating/Feeding: Set up;Supervision/ safety;Bed level   Grooming: Maximal assistance Grooming Details (indicate cue type and reason): sitting EOB Upper Body Bathing: Maximal assistance Upper Body Bathing Details (indicate cue type and reason): sitting EOB Lower Body Bathing: Total assistance Lower Body Bathing Details (indicate cue type and reason): Mod A sit<>stand Upper Body Dressing : Maximal assistance Upper Body Dressing Details (indicate cue type and reason): sitting EOB Lower Body Dressing: Total assistance Lower Body Dressing Details (indicate cue type and reason): Mod A sit<>stand Toilet Transfer: Moderate assistance Toilet Transfer Details (indicate cue type and reason): First with RW and then with Bil HHA (Mod A with both 5 steps forward and back) Toileting- Clothing Manipulation and Hygiene: Total assistance Toileting - Clothing Manipulation Details (indicate cue type and reason): Mod A sit<>stand             Vision   Additional Comments: Able to reach in all directions with RUE to grab my hand (increased time when reaching to left)            Pertinent Vitals/Pain Pain Assessment: No/denies pain     Hand Dominance Right   Extremity/Trunk Assessment Upper Extremity Assessment Upper Extremity Assessment: LUE deficits/detail LUE Deficits / Details: Apraxic and ataxic, when  asked him to attempt certain tasks he would move his arm, but not always in direction asked and when he did move it in direction asked it was took increased time and with minimal movement and very uncoordinated LUE Coordination: decreased fine motor;decreased gross motor           Communication Communication Communication: No difficulties   Cognition Arousal/Alertness: Awake/alert Behavior During Therapy: Flat affect Overall  Cognitive Status: No family/caregiver present to determine baseline cognitive functioning Area of Impairment: Problem solving;Following commands                       Following Commands: Follows one step commands with increased time (however even with increased time he cannot do activities asked of him involving his LUE (ataxic and apraxic))     Problem Solving: Slow processing;Decreased initiation;Difficulty sequencing;Requires verbal cues;Requires tactile cues                Home Living Family/patient expects to be discharged to:: Private residence Living Arrangements: Children;Spouse/significant other Available Help at Discharge: Family;Available 24 hours/day Type of Home: House Home Access: Stairs to enter [employer] of Steps: 2 Entrance Stairs-Rails: Right;Left;Can reach both Home Layout: One level     Bathroom Shower/Tub: Teacher, early years/pre: Handicapped height     Home Equipment: Cane - single point;Grab bars - tub/shower;Hand held Tourist information centre manager - 2 wheels;Bedside commode;Shower seat      Lives With: Spouse;Family    Prior Functioning/Environment Level of Independence: Needs assistance  Gait / Transfers Assistance Needed: pt reported that he occasionally uses a cane to ambulate ADL's / Homemaking Assistance Needed: pt reported that his family assists him occasionally with ADLs but he states he mostly does it by himself            OT Problem List: Decreased range of motion;Impaired balance (sitting and/or standing);Impaired UE functional use;Impaired tone      OT Treatment/Interventions: Self-care/ADL training;Therapeutic activities;Patient/family education;DME and/or AE instruction;Balance training    OT Goals(Current goals can be found in the care plan section) Acute Rehab OT Goals Patient Stated Goal: to lay back down and not get into recliner ("I'm tired") OT Goal Formulation: With patient Time For Goal  Achievement: 05/27/17 Potential to Achieve Goals: Good  OT Frequency: Min 2X/week              AM-PAC PT "6 Clicks" Daily Activity     Outcome Measure Help from another person eating meals?: A Little Help from another person taking care of personal grooming?: A Lot Help from another person toileting, which includes using toliet, bedpan, or urinal?: A Lot Help from another person bathing (including washing, rinsing, drying)?: A Lot Help from another person to put on and taking off regular upper body clothing?: A Lot Help from another person to put on and taking off regular lower body clothing?: Total 6 Click Score: 12   End of Session Equipment Utilized During Treatment: Gait belt;Rolling walker  Activity Tolerance: Patient tolerated treatment well Patient left: in bed;with call bell/phone within reach;with bed alarm set  OT Visit Diagnosis: Unsteadiness on feet (R26.81);Other abnormalities of gait and mobility (R26.89);Other symptoms and signs involving the nervous system (R29.898);Other symptoms and signs involving cognitive function                Time: 6629-4765 OT Time Calculation (min): 25 min Charges:  OT General Charges $OT Visit: 1 Procedure OT Evaluation $OT Eval Moderate Complexity: 1 Procedure OT  Treatments $Self Care/Home Management : 8-22 mins [place], OTR/L [phone] 05/13/2017

## 2017-05-13 NOTE — Progress Notes (Signed)
  Echocardiogram 2D Echocardiogram has been performed.  Kadin Bera L Androw 05/13/2017, 10:37 AM

## 2017-05-13 NOTE — Evaluation (Signed)
Physical Therapy Evaluation Patient Details Name: Nathan Frank MRN: [id number] DOB: 1941/02/12 Today's Date: 05/13/2017   History of Present Illness  Pt is a 76 y/o male admitted secondary to new onset seizures. PMH including but not limited to alcohol abuse, HTN, Hep C and hx of CVA.  Clinical Impression  Pt presented supine in bed with HOB elevated, awake and willing to participate in therapy session. Pt stated that he ambulates with use of SPC occasionally and requires assistance from family with ADLs. No family/caregivers present during session to confirm this information. Pt currently requires mod A for bed mobility and max A for sit<>stand transfer. Pt would continue to benefit from skilled physical therapy services at this time while admitted and after d/c to address the below listed limitations in order to improve overall safety and independence with functional mobility.     Follow Up Recommendations SNF;Supervision/Assistance - 24 hour    Equipment Recommendations  None recommended by PT    Recommendations for Other Services       Precautions / Restrictions Precautions Precautions: Fall;Other (comment) (seizure) Restrictions Weight Bearing Restrictions: No      Mobility  Bed Mobility Overal bed mobility: Needs Assistance Bed Mobility: Sit to Supine;Supine to Sit     Supine to sit: Mod assist Sit to supine: Min assist   General bed mobility comments: increased time and effort, use of bed pads to position pt's hips at EOB, assist to elevate trunk and with LEs to return to bed  Transfers Overall transfer level: Needs assistance Equipment used: 1 person hand held assist Transfers: Sit to/from Stand Sit to Stand: Max assist         General transfer comment: increased time and effort, cueing for technique and sequencing, max A to rise to full standing from EOB  Ambulation/Gait                Stairs            Wheelchair Mobility    Modified  Rankin (Stroke Patients Only)       Balance Overall balance assessment: Needs assistance Sitting-balance support: Feet supported Sitting balance-Leahy Scale: Fair     Standing balance support: During functional activity Standing balance-Leahy Scale: Poor Standing balance comment: max A                             Pertinent Vitals/Pain Pain Assessment: No/denies pain    Home Living Family/patient expects to be discharged to:: Private residence Living Arrangements: Children;Spouse/significant other Available Help at Discharge: Family;Available 24 hours/day Type of Home: House Home Access: Stairs to enter Entrance Stairs-Rails: Right;Left;Can reach both Entrance Stairs-Number of Steps: 2 Home Layout: One level Home Equipment: Cane - single point;Grab bars - tub/shower;Hand held shower head;Walker - 2 wheels;Bedside commode;Shower seat      Prior Function Level of Independence: Needs assistance   Gait / Transfers Assistance Needed: pt reported that he occasionally uses a cane to ambulate  ADL's / Homemaking Assistance Needed: pt reported that his family assists him occasionally with ADLs        Hand Dominance   Dominant Hand: Right    Extremity/Trunk Assessment   Upper Extremity Assessment Upper Extremity Assessment: Defer to OT evaluation    Lower Extremity Assessment Lower Extremity Assessment: Generalized weakness       Communication   Communication: No difficulties  Cognition Arousal/Alertness: Awake/alert Behavior During Therapy: Flat affect Overall Cognitive Status: No family/caregiver  present to determine baseline cognitive functioning Area of Impairment: Problem solving;Following commands;Safety/judgement                       Following Commands: Follows one step commands with increased time Safety/Judgement: Decreased awareness of safety;Decreased awareness of deficits   Problem Solving: Slow processing;Decreased  initiation;Difficulty sequencing;Requires verbal cues;Requires tactile cues General Comments: pt required multimodal cueing for sequencing with bed mobility      General Comments      Exercises     Assessment/Plan    PT Assessment Patient needs continued PT services  PT Problem List Decreased strength;Decreased activity tolerance;Decreased balance;Decreased mobility;Decreased coordination;Decreased cognition;Decreased knowledge of use of DME;Decreased safety awareness;Decreased knowledge of precautions       PT Treatment Interventions DME instruction;Gait training;Therapeutic exercise;Neuromuscular re-education;Patient/family education;Cognitive remediation;Balance training;Therapeutic activities;Functional mobility training;Stair training    PT Goals (Current goals can be found in the Care Plan section)  Acute Rehab PT Goals Patient Stated Goal: none stated PT Goal Formulation: With patient Time For Goal Achievement: 05/27/17 Potential to Achieve Goals: Fair    Frequency Min 3X/week   Barriers to discharge        Co-evaluation               AM-PAC PT "6 Clicks" Daily Activity  Outcome Measure Difficulty turning over in bed (including adjusting bedclothes, sheets and blankets)?: Total Difficulty moving from lying on back to sitting on the side of the bed? : Total Difficulty sitting down on and standing up from a chair with arms (e.g., wheelchair, bedside commode, etc,.)?: Total Help needed moving to and from a bed to chair (including a wheelchair)?: A Lot Help needed walking in hospital room?: A Lot Help needed climbing 3-5 steps with a railing? : Total 6 Click Score: 8    End of Session Equipment Utilized During Treatment: Gait belt Activity Tolerance: Patient tolerated treatment well Patient left: in bed;with call bell/phone within reach;with bed alarm set;Other (comment) (seizure precaution padding in place) Nurse Communication: Mobility status;Need for lift  equipment PT Visit Diagnosis: Unsteadiness on feet (R26.81);Other abnormalities of gait and mobility (R26.89)    Time: 1110-1136 PT Time Calculation (min) (ACUTE ONLY): 26 min   Charges:   PT Evaluation $PT Eval Moderate Complexity: 1 Mod PT Treatments $Therapeutic Activity: 8-22 mins   PT G Codes:        [place], PT, DPT [place] 05/13/2017, 12:35 PM

## 2017-05-13 NOTE — Evaluation (Signed)
Speech Language Pathology Evaluation Patient Details Name: Nathan Frank MRN: [id number] DOB: February 02, 1941 Today's Date: 05/13/2017 Time:  -     Problem List:  Patient Active Problem List   Diagnosis Date Noted  . 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 Hepatitis C 05/12/2017  . Atrial fibrillation, chronic ([place]) 05/12/2017  . History of alcohol abuse 05/12/2017  . Hypothyroidism 05/12/2017  . Chronic systolic congestive heart failure, NYHA class 1 ([place]) 05/12/2017  . Heroin abuse 05/12/2017  . Partial seizure disorder ([place]) 05/12/2017  . AKI (acute kidney injury) ([place])   . Demand ischemia ([place])   . NICM (nonischemic cardiomyopathy) ([place])   . Syncope   . Atrial fibrillation with RVR ([place]) 11/12/2016  . ARF (acute renal failure) ([place]) 11/12/2016  . Failure to thrive in adult   . Cellulitis of left knee 08/20/2016  . Back injury 08/20/2016  . Chronic systolic CHF (congestive heart failure) ([place]) 08/20/2016  . Atrial fibrillation with rapid ventricular response ([place]) 08/06/2016  . Acute blood loss anemia   . Lymphocytosis   . Atrial tachycardia ([place])   . Acute ischemic right MCA stroke ([place]) 07/04/2016  . Obesity 07/01/2016  . Family hx-stroke 07/01/2016  . Dysphagia, post-stroke   . Dysarthria, post-stroke   . Polysubstance abuse   . Chronic combined systolic and diastolic CHF (congestive heart failure) ([place])   . Mitral valve regurgitation   . Anemia of chronic disease   . Cerebral infarction due to embolism of right middle cerebral artery ([place]) - s/p mechanical thrombectomy 06/25/2016  . Acute on chronic diastolic CHF (congestive heart failure) ([place]) 06/09/2016  . History of CVA (cerebrovascular accident) 06/05/2016  . Alcoholic cirrhosis of liver without ascites ([place])   . Other specified hypothyroidism   . Hepatocellular carcinoma ([place])   . Generalized weakness 05/27/2016  . Hypotension 04/18/2016  . Chronic atrial fibrillation ([place]) 04/18/2016  . Prolonged  Q-T interval on ECG   . Neuropathy   . Essential hypertension   . Thrombocytopenia ([place]) 04/16/2015  . Abnormal TSH 04/16/2015  . NSTEMI (non-ST elevated myocardial infarction) ([place])   . Acute gastritis with hemorrhage   . GI bleed due to NSAIDs 12/13/2014  . Upper GI bleeding 12/13/2014  . Alcohol dependence ([place]) 02/02/2014  . Depressive disorder 02/01/2014  . Symptomatic cholelithiasis 12/15/2013  . DVT of lower limb, acute ([place]) 06/06/2012  . Alcohol abuse 06/04/2012  . Liver cirrhosis ([place]) 06/04/2012   Past Medical History:  Past Medical History:  Diagnosis Date  . Abnormal TSH   . Acute gastric ulcer   . Acute gastritis with hemorrhage   . Alcohol abuse   . Alcohol dependence ([place]) 02/02/2014  . Anemia   . Bifascicular block   . Cirrhosis ([place]) 06/04/2012  . Depressive disorder 02/01/2014  . Dizziness and giddiness 12/13/2014  . DVT of lower limb, acute ([place]) 06/06/2012  . DVT, lower extremity, recurrent ([place])    a. noted 2013. b. also dx 06/2016.  Marland Kitchen Essential hypertension   . Fatty liver   . Gallstones   . Gastritis 06/07/2012  . GERD (gastroesophageal reflux disease)   . GI bleed due to NSAIDs 12/13/2014  . Granulomatous gastritis   . Hematuria 06/04/2012  . Hepatitis C   . Hepatocellular carcinoma ([place])   . Heroin abuse    "I haven't done that since I don't know when."  . Heroin overdose 02/20/2014  . Neuropathy   . NICM (nonischemic cardiomyopathy) ([place])    a. 04/2015: EF  45-50% by cath. b. EF 40-45% by echo 06/2016.  . NSTEMI (non-ST elevated myocardial infarction) ([place])    a. 04/2015 - patent coronaries. Etiology possibly due to coronary spasm versus embolus, stress cardiomyopathy (atypical), and aborted infarction related to plaque rupture with thrombosis and dissolution. Amlodipine started. Not on antiplatelets due to GIB/cirrhosis history.  . Oral thrush 06/05/2012  . Polysubstance abuse    THC, alcohol, heroin  . Prolonged Q-T interval on ECG    a. 12/2014 -  treated with magnesium.  . Right knee pain 12/13/2014  . S/P alcohol detoxification 02/02/2014  . Stroke ([place]) 06/2016  . SVT (supraventricular tachycardia) ([place])    a. 12/2014 in setting of GIB, ETOH, NSAIDS, gastritis.  . Symptomatic cholelithiasis 12/15/2013  . Thrombocytopenia ([place])   . Upper GI bleeding 12/13/2014  . Weight loss 06/04/2012   Past Surgical History:  Past Surgical History:  Procedure Laterality Date  . CARDIAC CATHETERIZATION N/A 04/15/2015   Procedure: Left Heart Cath and Coronary Angiography;  Surgeon: Belva Crome, MD;  Location: [facility];  Service: Cardiovascular;  Laterality: N/A;  . CIRCUMCISION    . ESOPHAGOGASTRODUODENOSCOPY  06/07/2012   Procedure: ESOPHAGOGASTRODUODENOSCOPY (EGD);  Surgeon: Milus Banister, MD;  Location: [facility];  Service: Endoscopy;  Laterality: N/A;  may need treatment of varices  . ESOPHAGOGASTRODUODENOSCOPY N/A 12/14/2014   Procedure: ESOPHAGOGASTRODUODENOSCOPY (EGD);  Surgeon: Jerene Bears, MD;  Location[facility] END[facility];  Service: Endoscopy;  Laterality: N/A;  . IR GENERIC HISTORICAL  06/25/2016   IR US GUIDE VASC ACCESS LEFT 06/25/2016 Corrie Mckusick, DO [facility]  . IR GENERIC HISTORICAL  06/25/2016   IR RADIOLOGY PERIPHERAL GUIDED IV START 06/25/2016 Corrie Mckusick, DO [facility]  . IR GENERIC HISTORICAL  06/25/2016   IR PERCUTANEOUS ART THROMBECTOMY/INFUSION INTRACRANIAL INC DIAG ANGIO 06/25/2016 Luanne Bras, MD [facility]  . RADIOLOGY WITH ANESTHESIA N/A 06/25/2016   Procedure: RADIOLOGY WITH ANESTHESIA;  Surgeon: Luanne Bras, MD;  Location: [facility];  Service: Radiology;  Laterality: N/A;  . TONSILLECTOMY     HPI:  Pt is a 76 y.o. male with PMH significant for hepatitis C, alcohol abuse, history of CVA with residual left hemiparesis, nonischemic cardiomyopathy with an EF of 40-45%, hypothyroidism, atrial fibrillation eliquis, and history of noncompliance with medical therapies.patient was sent to the ER after his  wife noticed that his left face, arm and leg were twitching. Upon arrival to the ER he was able to answer most questions appropriately but was noted with a leftward gaze, left facialroop, left facial,left arm and left leg twitching. He has been evaluated by neurology who suspects he has developed simple partial seizures in the setting of his prior stroke. MRI showed small foci of acute infarct R parietal, L parietal, and L frontal lobe. EEG- focal cerebral dysfunction over R temporal region, does not exclude epilepsy.    Assessment / Plan / Recommendation Clinical Impression  Pt currently with cognitive-linguistic skills within functional limits for tasks assessed. Pt's speech intelligibility was mildly reduced with reduced vocal intensity; however, pt reports this is at baseline. Pt oriented x4, followed multi-step commands without difficulty, recalled information after a 5-minute delay, answered basic problem solving questions with ease, no signs of aphasia noted. SLP will sign off at this time. Please re-consult if needs arise.    SLP Assessment  SLP Recommendation/Assessment: Patient does not need any further Speech Lanaguage Pathology Services SLP Visit Diagnosis: Cognitive communication deficit (R41.841)    Follow Up Recommendations  None  Frequency and Duration           SLP Evaluation Cognition  Overall Cognitive Status: Within Functional Limits for tasks assessed Arousal/Alertness: Awake/alert Orientation Level: Oriented to person;Oriented to place;Oriented to situation Attention: Selective Selective Attention: Appears intact Memory: Appears intact Awareness:  (? awareness of physical deficits) Problem Solving: Appears intact Safety/Judgment: Appears intact       Comprehension  Auditory Comprehension Overall Auditory Comprehension: Appears within functional limits for tasks assessed Yes/No Questions: Within Functional Limits Commands: Within Functional  Limits Conversation: Simple    Expression Expression Primary Mode of Expression: Verbal Verbal Expression Overall Verbal Expression: Appears within functional limits for tasks assessed Initiation: No impairment Level of Generative/Spontaneous Verbalization: Conversation Repetition: No impairment Naming: No impairment Pragmatics: No impairment Non-Verbal Means of Communication: Not applicable   Oral / Motor  Oral Motor/Sensory Function Overall Oral Motor/Sensory Function: Mild impairment (suspect residual from previous stroke) Facial ROM: Reduced left Facial Symmetry: Abnormal symmetry left Motor Speech Overall Motor Speech: Impaired at baseline Phonation: Low vocal intensity Articulation: Impaired Level of Impairment: Conversation Intelligibility: Intelligible (occasionally reduced, more due to vocal quality) Motor Planning: Witnin functional limits   GO                    Amy Dionicia Abler, [place], CCC-SLP 05/13/2017, 11:25 AM  [id number]

## 2017-05-13 NOTE — Progress Notes (Signed)
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[place]) Active Problems:   Hepatitis C   History of CVA (cerebrovascular accident)   Atrial fibrillation, chronic (HCC)   History of alcohol abuse   Hypothyroidism   Chronic systolic congestive heart failure, NYHA class 1 (HCC)   Heroin abuse   Partial seizure disorder ([place])   New onset partial seizure disorder -Patient presents with witnessed left-sided seizure activity that has resolved with administration of Ativan, Keppra and fosphenytoin --Continue Keppra 500 IV every 12 -Seizure precautions -Telemetry monitoring -EEG suggestive of temporal lobe etiology to patient's seizure disorder -neurology input appreciated, thought recurrent embolic cva cause seizure onset  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 Question emboli -On eliquis prior to admission-?? Compliance -CTA head and neck to better clarify --Echocardiogram-likely will require TEE, case discussed with neurology at bedside who recommend no need of TEE due to established eombolic cva with afib, also discussed the possibility of left atrial apendage closure with neurology Dr Leonie Man who recommend will need patient to recover from this stroke and reassess patient's baseline function prior to decide on whether patient will be a candidate for the procedure -PT/OT/SOP evaluation -Added ASA -Continue frequent neurological checks  Chronic systolic heart failure EF 40-45%/moderate to severe tricuspid regurgitation -Currently compensated -Convert beta blocker to  IV -Not on aspirin or statin prior to admission  Chronic atrial fibrillation -Initially and if acutely tachycardic during seizure activity but current heart rate between 68 and 106 bpm -We'll utilize Lopressor IV scheduled -CHADVASC=5  Ongoing alcohol abuse/history of heroin abuse -Patient's states drank wine last yesterday evening so doubt seizures related to alcohol withdrawal -Alcohol level -UDS  Hepatitis C/chronic thrombocytopenia/prior h/o gi bleed -Mild elevation AST 50 likely related to ongoing alcohol abuse -Platelets are stable with usual baseline readings between 71,001 148,000  Hypothyroidism -Convert Synthroid to IV -Obtain TSH    DVT prophylaxis: Eliquis Code Status: Full Family Communication: No family at bedside  Disposition Plan: SNF Consults called: Neurology   Procedures:  none  Antibiotics:  none   Objective: BP 109/72 (BP Location: Right Arm)   Pulse 77   Temp 98.3 F (36.8 C) (Oral)   Resp 16   Ht 5\' 11"  (1.803 m)   Wt 82.2 kg (181 lb 3.5 oz)   SpO2 99%   BMI 25.27 kg/m   Intake/Output Summary (Last 24 hours) at 05/13/17 0815 Last data filed at 05/12/17 2053  Gross per 24 hour  Intake                0 ml  Output               50 ml  Net              -50 ml   Filed Weights   05/12/17 2046  Weight: 82.2 kg (181 lb 3.5 oz)    Exam: Patient is examined daily including today on 05/13/2017, exam remain the same as of yesterday except that is highlighted below   General:  NAD  Cardiovascular: IRRR  Respiratory: CTABL  Abdomen: Soft/ND/NT, positive BS  Musculoskeletal:  No Edema  Neuro: alert, able to provide history, mild left side weakness  Data Reviewed: Basic Metabolic Panel:  Recent Labs Lab 05/12/17 0155 05/12/17 0203 05/12/17 1041  NA 143 147* 143  K 4.4 4.3 4.1  CL 112* 110 113*  CO2 23  --  21*  GLUCOSE 104* 97 90  BUN 9 13 11   CREATININE 0.90 0.70 0.73  CALCIUM 8.4*  --  8.1*   Liver Function  Tests:  Recent Labs Lab 05/12/17 0155  AST 50*  ALT 17  ALKPHOS 68  BILITOT 1.4*  PROT 6.3*  ALBUMIN 2.8*   No results for input(s): LIPASE, AMYLASE in the last 168 hours. No results for input(s): AMMONIA in the last 168 hours. CBC:  Recent Labs Lab 05/12/17 0155 05/12/17 0203 05/13/17 0700  WBC 5.5  --  3.1*  NEUTROABS 1.7  --   --   HGB 12.9* 13.6 12.6*  HCT 37.1* 40.0 36.6*  MCV 87.3  --  88.2  PLT 132*  --  PENDING   Cardiac Enzymes:   No results for input(s): CKTOTAL, CKMB, CKMBINDEX, TROPONINI in the last 168 hours. BNP (last 3 results)  Recent Labs  06/09/16 1500 08/20/16 2041  BNP 463.2* 352.6*    ProBNP (last 3 results) No results for input(s): PROBNP in the last 8760 hours.  CBG:  Recent Labs Lab 05/12/17 0150  GLUCAP 99    No results found for this or any previous visit (from the past 240 hour(s)).   Studies: Ct Angio Head W Or Wo Contrast  Result Date: 05/12/2017 CLINICAL DATA:  Code stroke. Bilateral parietal and left frontal lobe infarcts. EXAM: CT ANGIOGRAPHY HEAD AND NECK TECHNIQUE: Multidetector CT imaging of the head and neck was performed using the standard protocol during bolus administration of intravenous contrast. Multiplanar CT image reconstructions and MIPs were obtained to evaluate the vascular anatomy. Carotid stenosis measurements (when applicable) are obtained utilizing NASCET criteria, using the distal internal carotid diameter as the denominator. CONTRAST:  50 mL Isovue 370 COMPARISON:  MRI brain from the same day. CT head without contrast from the same day. FINDINGS: CTA NECK FINDINGS Aortic arch: Minimal atherosclerotic calcifications are present in the distal aortic arch. There is no significant stenosis of the great vessel origins. Right carotid system: The right common carotid artery is within normal limits. Minimal atherosclerotic changes are present the right carotid bifurcation. There is tortuosity of the cervical right  internal carotid artery without a significant stenosis. The proximal cervical left internal carotid artery demonstrates mild irregularity without significant stenosis. Left carotid system: The left common carotid artery is within normal limits. The bifurcation is unremarkable. Mild tortuosity is present in the cervical left internal carotid artery without a significant stenosis. There is some irregularity of the cervical left internal carotid artery. Vertebral arteries: The vertebral arteries originate from the subclavian arteries bilaterally without significant stenosis. There is mild proximal tortuosity. Knee vertebral arteries are codominant. No focal stenosis is present in the neck. Skeleton: Vertebral body heights are normal. Alignment is anatomic. Mild facet degenerative changes are noted at C2-3 and C3-4. The patient is nearly edentulous. Other neck: The soft tissues of the neck are otherwise unremarkable. Upper chest: Marked pleural thickening and irregularity is present bilaterally. There is mild dependent atelectasis bilaterally. A prominent azygos vein is noted. Review of the MIP images confirms the above findings CTA HEAD FINDINGS Anterior circulation: Atherosclerotic changes are present within the cavernous internal carotid arteries bilaterally without significant stenosis. The left  internal carotid artery is of smaller caliber than the right. The ICA termini are within normal limits bilaterally. The left A1 segment is hypoplastic. Both A1 segments fill from the right with a patent anterior communicating artery. The M1 segments are within normal limits. The left MCA branch vessels are intact although smaller in caliber than those on the right. This may represent luxury perfusion on the right. There is no focal stenosis, aneurysm, or branch vessel occlusion within the ACA or MCA branch vessels. Posterior circulation: The vertebral arteries are codominant. The PICA origins are not discretely visualized.  Prominent AICA vessels are noted bilaterally. The basilar artery is small. Both posterior cerebral arteries originate from basilar tip posterior communicating arteries are present bilaterally, right greater than left. The PCA branch vessels are within normal limits. Venous sinuses: The dural sinuses are patent. Straight sinus and deep cerebral veins are intact. Cortical veins are unremarkable. Anatomic variants: Prominent posterior communicating arteries. Hypoplastic left A1 segment. Delayed phase: The postcontrast images demonstrate no pathologic enhancement. The remote infarction well seen. Acute punctate infarcts are not well visualized. Review of the MIP images confirms the above findings IMPRESSION: 1. Mild atherosclerotic changes at the right carotid bifurcation without significant stenosis. 2. ICA tortuosity is worse on the right. 3. Mild irregularity of the cervical internal carotid artery is bilaterally raises possibility of FMD. 4. Atherosclerotic calcifications within the cavernous internal carotid arteries bilaterally without significant stenosis Electronically Signed   By: San Morelle M.D.   On: 05/12/2017 13:32   Ct Angio Neck W Or Wo Contrast  Result Date: 05/12/2017 CLINICAL DATA:  Code stroke. Bilateral parietal and left frontal lobe infarcts. EXAM: CT ANGIOGRAPHY HEAD AND NECK TECHNIQUE: Multidetector CT imaging of the head and neck was performed using the standard protocol during bolus administration of intravenous contrast. Multiplanar CT image reconstructions and MIPs were obtained to evaluate the vascular anatomy. Carotid stenosis measurements (when applicable) are obtained utilizing NASCET criteria, using the distal internal carotid diameter as the denominator. CONTRAST:  50 mL Isovue 370 COMPARISON:  MRI brain from the same day. CT head without contrast from the same day. FINDINGS: CTA NECK FINDINGS Aortic arch: Minimal atherosclerotic calcifications are present in the distal  aortic arch. There is no significant stenosis of the great vessel origins. Right carotid system: The right common carotid artery is within normal limits. Minimal atherosclerotic changes are present the right carotid bifurcation. There is tortuosity of the cervical right internal carotid artery without a significant stenosis. The proximal cervical left internal carotid artery demonstrates mild irregularity without significant stenosis. Left carotid system: The left common carotid artery is within normal limits. The bifurcation is unremarkable. Mild tortuosity is present in the cervical left internal carotid artery without a significant stenosis. There is some irregularity of the cervical left internal carotid artery. Vertebral arteries: The vertebral arteries originate from the subclavian arteries bilaterally without significant stenosis. There is mild proximal tortuosity. Knee vertebral arteries are codominant. No focal stenosis is present in the neck. Skeleton: Vertebral body heights are normal. Alignment is anatomic. Mild facet degenerative changes are noted at C2-3 and C3-4. The patient is nearly edentulous. Other neck: The soft tissues of the neck are otherwise unremarkable. Upper chest: Marked pleural thickening and irregularity is present bilaterally. There is mild dependent atelectasis bilaterally. A prominent azygos vein is noted. Review of the MIP images confirms the above findings CTA HEAD FINDINGS Anterior circulation: Atherosclerotic changes are present within the cavernous internal carotid arteries bilaterally without significant stenosis. The  left internal carotid artery is of smaller caliber than the right. The ICA termini are within normal limits bilaterally. The left A1 segment is hypoplastic. Both A1 segments fill from the right with a patent anterior communicating artery. The M1 segments are within normal limits. The left MCA branch vessels are intact although smaller in caliber than those on the  right. This may represent luxury perfusion on the right. There is no focal stenosis, aneurysm, or branch vessel occlusion within the ACA or MCA branch vessels. Posterior circulation: The vertebral arteries are codominant. The PICA origins are not discretely visualized. Prominent AICA vessels are noted bilaterally. The basilar artery is small. Both posterior cerebral arteries originate from basilar tip posterior communicating arteries are present bilaterally, right greater than left. The PCA branch vessels are within normal limits. Venous sinuses: The dural sinuses are patent. Straight sinus and deep cerebral veins are intact. Cortical veins are unremarkable. Anatomic variants: Prominent posterior communicating arteries. Hypoplastic left A1 segment. Delayed phase: The postcontrast images demonstrate no pathologic enhancement. The remote infarction well seen. Acute punctate infarcts are not well visualized. Review of the MIP images confirms the above findings IMPRESSION: 1. Mild atherosclerotic changes at the right carotid bifurcation without significant stenosis. 2. ICA tortuosity is worse on the right. 3. Mild irregularity of the cervical internal carotid artery is bilaterally raises possibility of FMD. 4. Atherosclerotic calcifications within the cavernous internal carotid arteries bilaterally without significant stenosis Electronically Signed   By: San Morelle M.D.   On: 05/12/2017 13:32   Mr Jeri Cos QA Contrast  Result Date: 05/12/2017 CLINICAL DATA:  Seizure.  History of stroke EXAM: MRI HEAD WITHOUT AND WITH CONTRAST TECHNIQUE: Multiplanar, multiecho pulse sequences of the brain and surrounding structures were obtained without and with intravenous contrast. CONTRAST:  17 mL MultiHance IV COMPARISON:  CT head 05/12/2017 FINDINGS: Brain: Image quality degraded by motion Small foci of acute infarct in the high right parietal lobe, right mid parietal lobe, high left frontal lobe, and left posterior  parietal lobe. These areas measure approximately 1 cm each. Chronic right MCA infarct. Chronic left parietal infarct. Chronic microvascular ischemic change in the white matter. Negative for hemorrhage or mass. Generalized atrophy without hydrocephalus. Vascular: Normal arterial flow voids Skull and upper cervical spine: Negative Sinuses/Orbits: Negative Other: None IMPRESSION: Small foci of acute infarct in the right parietal lobe, left parietal lobe, and left frontal lobe. Question emboli Atrophy and chronic right MCA infarct and chronic left parietal infarct. Electronically Signed   By: Franchot Gallo M.D.   On: 05/12/2017 10:16    Scheduled Meds: . apixaban  5 mg Oral BID  . aspirin  300 mg Rectal Daily   Or  . aspirin  325 mg Oral Daily  . gadobenate dimeglumine  20 mL Intravenous Once  . levothyroxine  25 mcg Intravenous Daily  . metoprolol tartrate  5 mg Intravenous Q6H  . sodium chloride flush  3 mL Intravenous Q12H    Continuous Infusions: . sodium chloride 75 mL/hr at 05/12/17 2312  . levETIRAcetam Stopped (05/12/17 2236)     Time spent: 33mins I have personally reviewed and interpreted daily labs, tele strips, imagings as discussed above under date review session and assessment and plans. Case discussed with neurology at bedside at length.   Phillips Goulette MD, PhD  [facility] Pager [phone]. If 7PM-7AM, please contact night-coverage at [email or web address], password [facility] 05/13/2017, 8:15 AM  LOS: 1 day

## 2017-05-14 LAB — CBC
HCT: 34.6 % — ABNORMAL LOW (ref 39.0–52.0)
HEMOGLOBIN: 11.8 g/dL — AB (ref 13.0–17.0)
MCH: 29.9 pg (ref 26.0–34.0)
MCHC: 34.1 g/dL (ref 30.0–36.0)
MCV: 87.8 fL (ref 78.0–100.0)
Platelets: 110 10*3/uL — ABNORMAL LOW (ref 150–400)
RBC: 3.94 MIL/uL — AB (ref 4.22–5.81)
RDW: 16.7 % — ABNORMAL HIGH (ref 11.5–15.5)
WBC: 3.2 10*3/uL — AB (ref 4.0–10.5)

## 2017-05-14 LAB — COMPREHENSIVE METABOLIC PANEL
ALBUMIN: 2.3 g/dL — AB (ref 3.5–5.0)
ALK PHOS: 61 U/L (ref 38–126)
ALT: 13 U/L — AB (ref 17–63)
AST: 35 U/L (ref 15–41)
Anion gap: 6 (ref 5–15)
BUN: 7 mg/dL (ref 6–20)
CALCIUM: 7.9 mg/dL — AB (ref 8.9–10.3)
CHLORIDE: 111 mmol/L (ref 101–111)
CO2: 25 mmol/L (ref 22–32)
CREATININE: 0.82 mg/dL (ref 0.61–1.24)
GFR calc Af Amer: >60 mL/min (ref >60)
GFR calc non Af Amer: >60 mL/min (ref >60)
GLUCOSE: 98 mg/dL (ref 65–99)
Potassium: 3.5 mmol/L (ref 3.5–5.1)
SODIUM: 142 mmol/L (ref 135–145)
Total Bilirubin: 1 mg/dL (ref 0.3–1.2)
Total Protein: 5.4 g/dL — ABNORMAL LOW (ref 6.5–8.1)

## 2017-05-14 LAB — MAGNESIUM: Magnesium: 1.6 mg/dL — ABNORMAL LOW (ref 1.7–2.4)

## 2017-05-14 MED ORDER — MAGNESIUM SULFATE 2 GM/50ML IV SOLN
2.0000 g | Freq: Once | INTRAVENOUS | Status: AC
Start: 2017-05-14 — End: 2017-05-14
  Administered 2017-05-14: 2 g via INTRAVENOUS
  Filled 2017-05-14: qty 50

## 2017-05-14 MED ORDER — MAGNESIUM OXIDE 400 (241.3 MG) MG PO TABS
400.0000 mg | ORAL_TABLET | Freq: Every day | ORAL | Status: DC
  Administered 2017-05-14 – 2017-05-17 (×4): 400 mg via ORAL
  Filled 2017-05-14 (×3): qty 1

## 2017-05-14 MED ORDER — METOPROLOL TARTRATE 12.5 MG HALF TABLET
12.5000 mg | ORAL_TABLET | Freq: Two times a day (BID) | ORAL | Status: DC
  Administered 2017-05-14 – 2017-05-17 (×6): 12.5 mg via ORAL
  Filled 2017-05-14 (×6): qty 1

## 2017-05-14 MED ORDER — FOLIC ACID 1 MG PO TABS
1.0000 mg | ORAL_TABLET | Freq: Every day | ORAL | Status: DC
  Administered 2017-05-14 – 2017-05-17 (×4): 1 mg via ORAL
  Filled 2017-05-14 (×3): qty 1

## 2017-05-14 MED ORDER — LEVOTHYROXINE SODIUM 50 MCG PO TABS
50.0000 ug | ORAL_TABLET | Freq: Every day | ORAL | Status: DC
  Administered 2017-05-15 – 2017-05-17 (×3): 50 ug via ORAL
  Filled 2017-05-14 (×3): qty 1

## 2017-05-14 NOTE — Progress Notes (Signed)
CSW following to determine facility placement and facilitate discharge to SNF. CSW attempted to discuss bed offers with patient; patient appeared confused, lethargic, refusing to engage in conversation. CSW attempted to contact patient's son, Hilliard Clark, by phone, to discuss bed offers and determine facility placement; left voicemail for patient's son. CSW never received call back.  CSW will follow up tomorrow to continue discharge planning discussions.  Laveda Abbe, [place] Clinical Social Worker [phone]

## 2017-05-14 NOTE — Care Management Note (Signed)
Case Management Note  Patient Details  Name: Nathan Frank MRN: [id number] Date of Birth: 1941-08-28  Subjective/Objective:   Patient admitted with simple partial seizure disorder. He is from home with family.               Action/Plan: PT/OT recommending SNF. CM following for d/c needs.  Expected Discharge Date:                  Expected Discharge Plan:  Skilled Nursing Facility  In-House Referral:  Clinical Social Work  Discharge planning Services     Post Acute Care Choice:    Choice offered to:     DME Arranged:    DME Agency:     HH Arranged:    [place] Agency:     Status of Service:  In process, will continue to follow  If discussed at Long Length of Stay Meetings, dates discussed:    Additional Comments:  Pollie Friar, RN 05/14/2017, 12:48 PM

## 2017-05-14 NOTE — NC FL2 (Signed)
[place] LEVEL OF CARE SCREENING TOOL     IDENTIFICATION  Patient Name: Nathan Frank Birthdate: [birth year] Sex: male Admission Date (Current Location): 05/12/2017  [facility] and [place] Number:  Herbalist and Address:  [facility] [place], [place] [address], [place], [place] [address]      Provider Number: [id number]  Attending Physician Name and Address:  Florencia Reasons, MD  Relative Name and Phone Number:       Current Level of Care: Hospital Recommended Level of Care: [place] Prior Approval Number:    Date Approved/Denied:   PASRR Number: Pending  Discharge Plan: SNF    Current Diagnoses: Patient Active Problem List   Diagnosis Date Noted  . 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 Hepatitis C 05/12/2017  . Atrial fibrillation, chronic ([place]) 05/12/2017  . History of alcohol abuse 05/12/2017  . Hypothyroidism 05/12/2017  . Chronic systolic congestive heart failure, NYHA class 1 ([place]) 05/12/2017  . Heroin abuse 05/12/2017  . Partial seizure disorder ([place]) 05/12/2017  . AKI (acute kidney injury) ([place])   . Demand ischemia ([place])   . NICM (nonischemic cardiomyopathy) ([place])   . Syncope   . Atrial fibrillation with RVR ([place]) 11/12/2016  . ARF (acute renal failure) ([place]) 11/12/2016  . Failure to thrive in adult   . Cellulitis of left knee 08/20/2016  . Back injury 08/20/2016  . Chronic systolic CHF (congestive heart failure) ([place]) 08/20/2016  . Atrial fibrillation with rapid ventricular response ([place]) 08/06/2016  . Acute blood loss anemia   . Lymphocytosis   . Atrial tachycardia ([place])   . Acute ischemic right MCA stroke ([place]) 07/04/2016  . Obesity 07/01/2016  . Family hx-stroke 07/01/2016  . Dysphagia, post-stroke   . Dysarthria, post-stroke   . Polysubstance abuse   . Chronic combined systolic and diastolic CHF (congestive heart failure) ([place])   . Mitral valve regurgitation   . Anemia of chronic  disease   . Cerebral infarction due to embolism of right middle cerebral artery ([place]) - s/p mechanical thrombectomy 06/25/2016  . Acute on chronic diastolic CHF (congestive heart failure) ([place]) 06/09/2016  . History of CVA (cerebrovascular accident) 06/05/2016  . Alcoholic cirrhosis of liver without ascites ([place])   . Other specified hypothyroidism   . Hepatocellular carcinoma ([place])   . Generalized weakness 05/27/2016  . Hypotension 04/18/2016  . Chronic atrial fibrillation ([place]) 04/18/2016  . Prolonged Q-T interval on ECG   . Neuropathy   . Essential hypertension   . Thrombocytopenia ([place]) 04/16/2015  . Abnormal TSH 04/16/2015  . NSTEMI (non-ST elevated myocardial infarction) ([place])   . Acute gastritis with hemorrhage   . GI bleed due to NSAIDs 12/13/2014  . Upper GI bleeding 12/13/2014  . Alcohol dependence ([place]) 02/02/2014  . Depressive disorder 02/01/2014  . Symptomatic cholelithiasis 12/15/2013  . DVT of lower limb, acute ([place]) 06/06/2012  . Alcohol abuse 06/04/2012  . Liver cirrhosis ([place]) 06/04/2012    Orientation RESPIRATION BLADDER Height & Weight     Self, Time, Situation, Place  Normal Incontinent, External catheter Weight: 192 lb 14.4 oz (87.5 kg) Height:  5\' 11"  (180.3 cm)  BEHAVIORAL SYMPTOMS/MOOD NEUROLOGICAL BOWEL NUTRITION STATUS    Convulsions/Seizures Continent    AMBULATORY STATUS COMMUNICATION OF NEEDS Skin   Extensive Assist Verbally Normal                       Personal Care Assistance Level of Assistance  Bathing, Dressing Bathing Assistance: Maximum assistance   Dressing Assistance: Maximum assistance     Functional Limitations Info             SPECIAL CARE FACTORS FREQUENCY  PT (By licensed PT), OT (By licensed OT)     PT Frequency: 5x/wk OT Frequency: 5x/wk            Contractures      Additional Factors Info  Code Status, Allergies Code Status Info: Full Allergies Info: Nsaids, Oxycodone           Current  Medications (05/14/2017):  This is the current hospital active medication list Current Facility-Administered Medications  Medication Dose Route Frequency Provider Last Rate Last Dose  . 0.9 %  sodium chloride infusion   Intravenous Continuous Samella Parr, NP 75 mL/hr at 05/12/17 2312    . acetaminophen (TYLENOL) tablet 650 mg  650 mg Oral Q6H PRN Samella Parr, NP       Or  . acetaminophen (TYLENOL) suppository 650 mg  650 mg Rectal Q6H PRN Samella Parr, NP      . apixaban (ELIQUIS) tablet 5 mg  5 mg Oral BID Samella Parr, NP   5 mg at 05/14/17 1033  . aspirin suppository 300 mg  300 mg Rectal Daily Samella Parr, NP       Or  . aspirin tablet 325 mg  325 mg Oral Daily Samella Parr, NP   325 mg at 05/14/17 1033  . gadobenate dimeglumine (MULTIHANCE) injection 20 mL  20 mL Intravenous Once Waldemar Dickens, MD      . levETIRAcetam (KEPPRA) 1,000 mg in sodium chloride 0.9 % 100 mL IVPB  1,000 mg Intravenous Q12H Greta Doom, MD   Stopped at 05/13/17 2154  . levothyroxine (SYNTHROID, LEVOTHROID) injection 25 mcg  25 mcg Intravenous Daily Samella Parr, NP   25 mcg at 05/14/17 1034  . metoprolol tartrate (LOPRESSOR) injection 5 mg  5 mg Intravenous Q6H Samella Parr, NP   5 mg at 05/14/17 0555  . sodium chloride flush (NS) 0.9 % injection 3 mL  3 mL Intravenous Q12H Samella Parr, NP   3 mL at 05/14/17 1041     Discharge Medications: Please see discharge summary for a list of discharge medications.  Relevant Imaging Results:  Relevant Lab Results:   Additional Information SS#: [id number]  Geralynn Ochs, LCSW

## 2017-05-14 NOTE — Progress Notes (Signed)
STROKE TEAM PROGRESS NOTE   HISTORY OF PRESENT ILLNESS (per record)  SUBJECTIVE (INTERVAL HISTORY) He is alone in the room.  The patient is awake and mildly confused, but follows all commands appropriately.  No new changes.  Case Manager still waiting to hear back regarding cost for patient to switch from current anticoagulation (Eliquis) to Pradaxa.  Continuing keppra.  No further recurrent seizures since admission.  OBJECTIVE Temp:  [98.2 F (36.8 C)-98.9 F (37.2 C)] 98.4 F (36.9 C) (08/10 0544) Pulse Rate:  [74-100] 100 (08/10 0544) Cardiac Rhythm: Atrial fibrillation (08/10 0700) Resp:  [15-18] 18 (08/10 0544) BP: (103-121)/(62-89) 121/89 (08/10 0544) SpO2:  [95 %-100 %] 100 % (08/10 0544) Weight:  [87.5 kg (192 lb 14.4 oz)] 87.5 kg (192 lb 14.4 oz) (08/10 0500)  CBC:   Recent Labs Lab 05/12/17 0155  05/13/17 0700 05/14/17 0304  WBC 5.5  --  3.1* 3.2*  NEUTROABS 1.7  --   --   --   HGB 12.9*  < > 12.6* 11.8*  HCT 37.1*  < > 36.6* 34.6*  MCV 87.3  --  88.2 87.8  PLT 132*  --  97* 110*  < > = values in this interval not displayed.  Basic Metabolic Panel:   Recent Labs Lab 05/13/17 0700 05/14/17 0304  NA 142 142  K 3.4* 3.5  CL 111 111  CO2 20* 25  GLUCOSE 82 98  BUN 8 7  CREATININE 0.78 0.82  CALCIUM 8.0* 7.9*  MG  --  1.6*    Lipid Panel:     Component Value Date/Time   CHOL 101 05/13/2017 0700   TRIG 53 05/13/2017 0700   HDL 33 (L) 05/13/2017 0700   CHOLHDL 3.1 05/13/2017 0700   VLDL 11 05/13/2017 0700   LDLCALC 57 05/13/2017 0700   HgbA1c:  Lab Results  Component Value Date   HGBA1C 4.4 (L) 05/13/2017   Urine Drug Screen:     Component Value Date/Time   LABOPIA NONE DETECTED 05/12/2017 1401   COCAINSCRNUR NONE DETECTED 05/12/2017 1401   COCAINSCRNUR NEGATIVE 12/13/2014 2353   LABBENZ NONE DETECTED 05/12/2017 1401   LABBENZ POSITIVE (A) 12/13/2014 2353   AMPHETMU NONE DETECTED 05/12/2017 1401   THCU NONE DETECTED 05/12/2017 1401    LABBARB NONE DETECTED 05/12/2017 1401    Alcohol Level     Component Value Date/Time   ETH <5 05/12/2017 1040    IMAGING  Ct Angio Head W Or Wo Contrast Ct Angio Neck W Or Wo Contrast 05/12/2017 IMPRESSION: 1. Mild atherosclerotic changes at the right carotid bifurcation without significant stenosis. 2. ICA tortuosity is worse on the right. 3. Mild irregularity of the cervical internal carotid artery is bilaterally raises possibility of FMD. 4. Atherosclerotic calcifications within the cavernous internal carotid arteries bilaterally without significant stenosis.   [name] 05/12/2017 IMPRESSION: Small foci of acute infarct in the right parietal lobe, left parietal lobe, and left frontal lobe. Question emboli Atrophy and chronic right MCA infarct and chronic left parietal infarct.   Ct Head Code Stroke Wo Contrast 05/12/2017   IMPRESSION: 1. No acute intracranial process on this moderate to severely motion degraded examination. 2. Stable examination including bilateral MCA territory infarcts. 3. ASPECTS is 10.  EEG 8/82018 Impression: This awake and asleep EEG is abnormal due to focal suppression over the right temporal region.  TTE 05/13/2017 Study Conclusions - Left ventricle: The cavity size was normal. There was mild concentric hypertrophy. Systolic function was mildly  reduced. The  estimated ejection fraction was in the range of 45% to 50%. Diffuse hypokinesis. The study was not technically sufficient to allow evaluation of LV diastolic dysfunction due to atrial fibrillation.   - Aortic valve: Trileaflet; mildly thickened, mildly calcified leaflets. There was trivial regurgitation. - Mitral valve: There was mild regurgitation. Valve area by pressure half-time: 1.75 cm^2. - Left atrium: The atrium was moderately dilated. - Right ventricle: The cavity size was moderately dilated. Wall thickness was normal. Systolic function was mildly reduced. - Right atrium: The atrium  was severely dilated. - Tricuspid valve: There was moderate-severe regurgitation. - Pulmonary arteries: Systolic pressure was mildly increased. PA peak pressure: 32 mm Hg (S).    PHYSICAL EXAM Frail cachectic elderly African-American male currently not in distress. . Afebrile. Head is nontraumatic. Neck is supple without bruit.    Cardiac exam no murmur or gallop. Lungs are clear to auscultation. Distal pulses are well felt. Neurological Exam  Awake alert oriented 2. No visible focal seizures. Extraocular moments are full range without nystagmus. Blinks to threat bilaterally. Vision acuity seems adequate. Fundi were not visualized. Mild left lower facial weakness. Tongue midline. Mild left hemiparesis 4+/5 weakness of left grip and intrinsic hand muscles. Orbits right over left upper extremity. Fine finger movements are diminished on the left. Mild left hip flexor weakness Deep tendon reflexes are symmetric. Plantars are downgoing. Gait was not tested. Sensation appears preserved bilaterally. ASSESSMENT/PLAN [name] is a 76 y.o. male with history of hepatitis C, ETOH abuse, nonischemic cardiomyopathy, stroke with residual left hemiparesis, hypothyroidism, history of medicine noncompliance per chart, and atrial fibrillation on Eliquis who presented with left facial droop, leftward gaze, and left arm and leg twitching. He did not receive IV t-PA due to simple partial seizure/being on oral anticoagluation.   Stroke:  Small foci of acute infarct in the right parietal lobe, left parietal lobe, and left frontal lobe, likely cardioembolic in the setting of atrial fibrillation on Eliquis, ischemic cardiomyopathy, and prior stroke.  Resultant  confusion and left hemiparesis  CT head: no acute stroke  MRI head: Small foci of acute infarct in the right parietal lobe, left parietal lobe, and left frontal lobe  MRA head  not ordered  CTA head/neck: no LVO or high-grade stenosis  2D Echo: EF  45-50%. No source of embolus   LDL 57  HgbA1c 4.4  SCDs for VTE prophylaxis Diet Heart Room service appropriate? Yes; Fluid consistency: Thin  Eliquis (apixaban) daily prior to admission, now on aspirin 81 mg daily and Eliquis (apixaban) daily  Patient counseled to be compliant with his antithrombotic medications  Ongoing aggressive stroke risk factor management  Therapy recommendations: SNF  Disposition:   pending  Hypertension  Stable  Permissive hypertension (OK if < 220/120) but gradually normalize in 5-7 days  Long-term BP goal normotensive  Other Stroke Risk Factors  Advanced age  ETOH use, advised to drink no more than 2 drink(s) a day  Hx stroke/TIA  Family hx stroke (Father)  Coronary artery disease  Ischemic cardiomyopathy  HFrEF (EF: 45-50%)  Atrial fibrillation  Other Active Problems  Hepatitis C: HCV ab > 11.0, ALT 17, AST 50, PLT 97, T bili 1.4  Thrombocytopenia, chronic: PLT 97  Hospital day # 2  I have personally examined this patient, reviewed notes, independently viewed imaging studies, participated in medical decision making and plan of care.ROS completed by me personally and pertinent positives fully documented  I have made any additions or  clarifications directly to the above note. Agree with note above.  He has presented with focal seizure status secondary to recurrent by cerebral embolic infarcts from his atrial fibrillation despite anticoagulation with eliquis.Considering switching patient from Eliquis to Pradaxa  150mg  PO BID since the patient has had recurrent cardioembolic stroke while taking Eliquis.  Case Management contacted to determine that patient can afford co-pay for Pradaxa. Continue Keppra for seizures Discussed with Dr. Erlinda Hong and answered questions.  Transfer to skilled nursing facility when bed available. Follow-up as outpatient in stroke clinic in 6 weeks. Stroke team will sign off. Kindly call for questions.  Antony Contras,  MD Medical Director [facility] Pager: [phone] 05/14/2017 8:06 AM   To contact Stroke Continuity provider, please refer to [email or web address] After hours, contact General Neurology

## 2017-05-14 NOTE — Progress Notes (Signed)
PROGRESS NOTE  Nathan Frank XBL:[id number] DOB: 1940/11/08 DOA: 05/12/2017 PCP: Thressa Sheller, MD  HPI/Recap of past 24 hours:  Report feeling better, no seizure since admitted, alert , able to provide history He reports he understands that he has new strokes while he is taking elliquis ( he reports his cardiologist reduced elliquis from twice a day to once a day a month ago, I doubt this is true, I have reviewed outpatient recent cardiology note, at that time cardiology continued to recommend elliquis twice a day  He denies bleeding tendency, no recent gi bleed He reports he has cut down on alcohol use, he drinks a little wine a few days a week  Assessment/Plan: Principal Problem:   Simple partial seizure disorder ([place]) Active Problems:   Hepatitis C   History of CVA (cerebrovascular accident)   Atrial fibrillation, chronic (HCC)   History of alcohol abuse   Hypothyroidism   Chronic systolic congestive heart failure, NYHA class 1 ([place])   Heroin abuse   Partial seizure disorder ([place])   New onset partial seizure disorder --Patient presents with witnessed left-sided seizure activity that has resolved with administration of Ativan, Keppra and fosphenytoin --Continue Keppra 500 IV every 12 --Seizure precautions --EEG suggestive of temporal lobe etiology to patient's seizure disorder --neurology input appreciated, thought recurrent embolic cva cause seizure onset --no more seizure in the hospital, transition all iv meds to oral.   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 Question emboli -On eliquis prior to admission-?? Compliance -CTA head and neck with mild disease, no significant stenosis --case discussed with neurology on 8/9 at bedside who recommend no need of TEE due to established eombolic  cva with afib, also discussed the possibility of left atrial apendage closure with neurology Dr Leonie Man who recommend will need patient to recover from this stroke and reassess patient's baseline function prior to decide on whether patient will be a candidate for the procedure -PT/OT/SOP evaluation -case discussed with neurology on 8/10 over the phone, per neurology Dr Leonie Man, no need of asa, consider DTI with pradaxa if patient able to afford, or can continue Xa inhibitor eliquis bid (since there is a question of compliance issues) -SNF placement pending  Chronic atrial fibrillation -tele reviewed and interpreted daily, in aifb/ rate controlled -was on Lopressor IV scheduled, now able to take oral, transition back to oral meds -CHADVASC=5 -on eliquis ( question of noncompliance)  Chronic systolic heart failure EF 40-45%/moderate to severe tricuspid regurgitation -Currently compensated, d/c ivf -d/c iv betablocker, change back to oral meds   Ongoing alcohol abuse/history of heroin abuse -Patient's states drank wine last yesterday evening so doubt seizures related to alcohol withdrawal -Alcohol level <5 -UDS negative  Hepatitis C/chronic thrombocytopenia/prior h/o gi bleed -Mild elevation AST 50 likely related to ongoing alcohol abuse -Platelets are stable with usual baseline readings between 71,001 148,000 -he was referred to infection disease in the past for hep c management, not sure if patient had followed up. Advise patient to follow up with infectious disease (contact info provided)  Hypothyroidism -Convert Synthroid to IV -Obtain TSH    DVT prophylaxis: Eliquis Code Status: Full Family Communication: No family at bedside  Disposition Plan: SNF Consults called: Neurology   Procedures:  none  Antibiotics:  none   Objective: BP 136/78 (BP Location: Right Arm)  Pulse 72   Temp 98.6 F (37 C) (Axillary)   Resp 18   Ht 5\' 11"  (1.803 m)   Wt 87.5 kg (192 lb  14.4 oz)   SpO2 95%   BMI 26.90 kg/m   Intake/Output Summary (Last 24 hours) at 05/14/17 1131 Last data filed at 05/13/17 2300  Gross per 24 hour  Intake                0 ml  Output              400 ml  Net             -400 ml   Filed Weights   05/12/17 2046 05/14/17 0500  Weight: 82.2 kg (181 lb 3.5 oz) 87.5 kg (192 lb 14.4 oz)    Exam: Patient is examined daily including today on 05/14/2017, exam remain the same as of yesterday except the changes   General:  NAD, aaox3,   Cardiovascular: IRRR, rate controlled  Respiratory: CTABL  Abdomen: Soft/ND/NT, positive BS  Musculoskeletal: No Edema  Neuro: alert, able to provide history, mild left side weakness ( left arm weakness more pronounced than left leg, he report this is chronic)  Data Reviewed: Basic Metabolic Panel:  Recent Labs Lab 05/12/17 0155 05/12/17 0203 05/12/17 1041 05/13/17 0700 05/14/17 0304  NA 143 147* 143 142 142  K 4.4 4.3 4.1 3.4* 3.5  CL 112* 110 113* 111 111  CO2 23  --  21* 20* 25  GLUCOSE 104* 97 90 82 98  BUN 9 13 11 8 7   CREATININE 0.90 0.70 0.73 0.78 0.82  CALCIUM 8.4*  --  8.1* 8.0* 7.9*  MG  --   --   --   --  1.6*   Liver Function Tests:  Recent Labs Lab 05/12/17 0155 05/14/17 0304  AST 50* 35  ALT 17 13*  ALKPHOS 68 61  BILITOT 1.4* 1.0  PROT 6.3* 5.4*  ALBUMIN 2.8* 2.3*   No results for input(s): LIPASE, AMYLASE in the last 168 hours. No results for input(s): AMMONIA in the last 168 hours. CBC:  Recent Labs Lab 05/12/17 0155 05/12/17 0203 05/13/17 0700 05/14/17 0304  WBC 5.5  --  3.1* 3.2*  NEUTROABS 1.7  --   --   --   HGB 12.9* 13.6 12.6* 11.8*  HCT 37.1* 40.0 36.6* 34.6*  MCV 87.3  --  88.2 87.8  PLT 132*  --  97* 110*   Cardiac Enzymes:   No results for input(s): CKTOTAL, CKMB, CKMBINDEX, TROPONINI in the last 168 hours. BNP (last 3 results)  Recent Labs  06/09/16 1500 08/20/16 2041  BNP 463.2* 352.6*    ProBNP (last 3 results) No results  for input(s): PROBNP in the last 8760 hours.  CBG:  Recent Labs Lab 05/12/17 0150  GLUCAP 99    No results found for this or any previous visit (from the past 240 hour(s)).   Studies: No results found.  Scheduled Meds: . apixaban  5 mg Oral BID  . aspirin  300 mg Rectal Daily   Or  . aspirin  325 mg Oral Daily  . gadobenate dimeglumine  20 mL Intravenous Once  . levothyroxine  25 mcg Intravenous Daily  . metoprolol tartrate  5 mg Intravenous Q6H  . sodium chloride flush  3 mL Intravenous Q12H    Continuous Infusions: . sodium chloride 75 mL/hr at 05/12/17 2312  . levETIRAcetam Stopped (05/13/17 2154)     Time  spent: 37mins I have personally reviewed and interpreted daily labs, tele strips as discussed above under date review session and assessment and plans. Case discussed with neurology over the phone. Case discussed with social worker at bedside.   Riccardo Holeman MD, PhD  [facility] Pager [phone]. If 7PM-7AM, please contact night-coverage at [email or web address], password [facility] 05/14/2017, 11:31 AM  LOS: 2 days

## 2017-05-14 NOTE — Progress Notes (Signed)
CM consulted for a benefits check on pradaxa 150 mg BID. Results:   Pt copay will be $95 medication is a tier 4 drug- prior auth not required.  Stroke team notified

## 2017-05-14 NOTE — Clinical Social Work Note (Signed)
Clinical Social Work Assessment  Patient Details  Name: Nathan Frank MRN: [id number] Date of Birth: 23-Apr-1941  Date of referral:  05/14/17               Reason for consult:  Facility Placement, Discharge Planning                Permission sought to share information with:  Facility Sport and exercise psychologist, Family Supports Permission granted to share information::  Yes, Verbal Permission Granted  Name::     Diplomatic Services operational officer::  SNF  Relationship::  Son  Contact Information:     Housing/Transportation Living arrangements for the past 2 months:  Single Family Home Source of Information:  Patient, Adult Children Patient Interpreter Needed:  None Criminal Activity/Legal Involvement Pertinent to Current Situation/Hospitalization:  No - Comment as needed Significant Relationships:  Adult Children Lives with:  Self Do you feel safe going back to the place where you live?  Yes Need for family participation in patient care:  No (Coment)  Care giving concerns:  Patient currently lives at home alone with intermittent support from family, but needs additional care and rehabilitation at this time prior to returning home.   Social Worker assessment / plan:  CSW attempted to meet with patient to discuss recommendation for SNF and patient would not wake up. CSW contacted patient's son, Nathan Frank, by phone to discuss SNF recommendation. Patient's son indicated that patient had been to rehab in the past, and would be agreeable to going now. Patient's son could not remember exactly where patient had been for rehab in the past. CSW attempted to discuss SNF placement with patient and he woke up slightly to answer questions minimally. Patient indicated that he would go to SNF, and had been at [facility] in the past but would not want to go back there. Patient indicated he didn't know where else he would want to go. CSW provided facility list to patient and recommended he review information to determine facility  preference. CSW will follow up with bed offers and determine facility choice to facilitate discharge when medically ready.  Employment status:  Retired Nurse, adult PT Recommendations:  [place] / Referral to community resources:  [place]  Patient/Family's Response to care:  Patient agreeable to SNF placement.  Patient/Family's Understanding of and Emotional Response to Diagnosis, Current Treatment, and Prognosis:  Patient appears to understand current need for additional care and rehabilitation at this time. Patient minimally engaged in discussion, and was very lethargic and sleeping when CSW was in room. Patient's son was appreciative of assistance and indicated that CSW could contact for assistance, if needed.  Emotional Assessment Appearance:  Appears stated age Attitude/Demeanor/Rapport:  Lethargic, Unresponsive Affect (typically observed):  Flat Orientation:  Oriented to Self, Oriented to Place, Oriented to  Time, Oriented to Situation Alcohol / Substance use:  Not Applicable Psych involvement (Current and /or in the community):  No (Comment)  Discharge Needs  Concerns to be addressed:  Care Coordination, Discharge Planning Concerns Readmission within the last 30 days:  No Current discharge risk:  Lives alone, Physical Impairment Barriers to Discharge:  Continued Medical Work up   [employer], [place] 05/14/2017, 11:45 AM

## 2017-05-15 LAB — AMMONIA: AMMONIA: 39 umol/L — AB (ref 9–35)

## 2017-05-15 LAB — CBC
HEMATOCRIT: 33.4 % — AB (ref 39.0–52.0)
Hemoglobin: 11.5 g/dL — ABNORMAL LOW (ref 13.0–17.0)
MCH: 30 pg (ref 26.0–34.0)
MCHC: 34.4 g/dL (ref 30.0–36.0)
MCV: 87.2 fL (ref 78.0–100.0)
PLATELETS: 115 10*3/uL — AB (ref 150–400)
RBC: 3.83 MIL/uL — AB (ref 4.22–5.81)
RDW: 16.6 % — ABNORMAL HIGH (ref 11.5–15.5)
WBC: 3 10*3/uL — ABNORMAL LOW (ref 4.0–10.5)

## 2017-05-15 LAB — BASIC METABOLIC PANEL
ANION GAP: 7 (ref 5–15)
BUN: 7 mg/dL (ref 6–20)
CALCIUM: 8 mg/dL — AB (ref 8.9–10.3)
CO2: 22 mmol/L (ref 22–32)
Chloride: 112 mmol/L — ABNORMAL HIGH (ref 101–111)
Creatinine, Ser: 0.8 mg/dL (ref 0.61–1.24)
GLUCOSE: 86 mg/dL (ref 65–99)
POTASSIUM: 3.3 mmol/L — AB (ref 3.5–5.1)
Sodium: 141 mmol/L (ref 135–145)

## 2017-05-15 LAB — MAGNESIUM: Magnesium: 1.8 mg/dL (ref 1.7–2.4)

## 2017-05-15 MED ORDER — LACTULOSE 10 GM/15ML PO SOLN: 10.0000 g | Freq: Every day | ORAL | Status: DC

## 2017-05-15 MED ORDER — LEVETIRACETAM 500 MG PO TABS
1000.0000 mg | ORAL_TABLET | Freq: Two times a day (BID) | ORAL | Status: DC
  Administered 2017-05-15 – 2017-05-17 (×4): 1000 mg via ORAL
  Filled 2017-05-15 (×4): qty 2

## 2017-05-15 MED ORDER — LEVETIRACETAM 500 MG PO TABS: 1000.0000 mg | ORAL_TABLET | Freq: Two times a day (BID) | ORAL | Status: DC

## 2017-05-15 MED ORDER — POTASSIUM CHLORIDE CRYS ER 20 MEQ PO TBCR
40.0000 meq | EXTENDED_RELEASE_TABLET | Freq: Once | ORAL | Status: AC
  Administered 2017-05-15: 40 meq via ORAL
  Filled 2017-05-15: qty 2

## 2017-05-15 NOTE — Progress Notes (Signed)
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[place]) Active Problems:   Hepatitis C   History of CVA (cerebrovascular accident)   Atrial fibrillation, chronic (HCC)   History of alcohol abuse   Hypothyroidism   Chronic systolic congestive heart failure, NYHA class 1 (HCC)   Heroin abuse   Partial seizure disorder ([place])   New onset partial seizure disorder --Patient presents with witnessed left-sided seizure activity that has resolved with administration of Ativan, Keppra and fosphenytoin -EEG suggestive of temporal lobe etiology to patient's seizure disorder --he is started on Keppra 500 IV every 12, he is improving, change to oral keppra on 8/11 --Seizure precautions, no more seizure in the hospital ----neurology input appreciated, thought recurrent embolic cva cause seizure onset  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 Question emboli -On eliquis prior to admission-?? Compliance -CTA head and neck with mild disease, no significant stenosis --case discussed with neurology on 8/9 at bedside who recommend no need of TEE due to established eombolic cva with afib, also discussed the possibility of left atrial apendage closure with neurology Dr Leonie Man who recommend will need patient to recover from this stroke and reassess patient's baseline function prior  to decide on whether patient will be a candidate for the procedure -PT/OT/SP evaluation -case discussed with neurology on 8/10 over the phone, per neurology Dr Leonie Man, no need of asa, consider DTI with pradaxa if patient able to afford, or can continue Xa inhibitor eliquis bid (since there is a question of compliance issues) -SNF placement pending, awaiting passar   Chronic atrial fibrillation -tele reviewed and interpreted daily, in aifb/ rate controlled -was on Lopressor IV scheduled, now able to take oral, transition back to oral meds -CHADVASC=5 -on eliquis ( question of noncompliance)  Chronic systolic heart failure EF 40-45%/moderate to severe tricuspid regurgitation -Currently compensated, d/c ivf -d/c iv betablocker, change back to oral meds   Ongoing alcohol abuse/history of heroin abuse -Patient's states drank wine last yesterday evening so doubt seizures related to alcohol withdrawal -Alcohol level <5 -UDS negative  Hepatitis C/chronic thrombocytopenia/prior h/o gi bleed -Mild elevation AST 50 likely related to ongoing alcohol abuse -Platelets are stable with usual baseline readings between 71,001 148,000 -he was referred to infection disease in the past for hep c management, not sure if patient had followed up. Advise patient to follow up with infectious disease (contact info provided) -he report has not been following with a infectious disease doctor for this , I again advise him to follow up with infectious disease, as hep c now is curable most of the cases.  Hypothyroidism -was on iv Synthroid , changed back to oral supplement -TSH 1.3    DVT prophylaxis: Eliquis Code Status: Full Family Communication: No family at bedside  Disposition Plan: SNF Consults called: Neurology   Procedures:  none  Antibiotics:  none  Objective: BP 132/76 (BP Location: Left Arm)   Pulse 92   Temp 98 F (36.7 C) (Oral)   Resp 18   Ht 5\' 11"  (1.803 m)   Wt 87.5  kg (192 lb 14.4 oz)   SpO2 96%   BMI 26.90 kg/m   Intake/Output Summary (Last 24 hours) at 05/15/17 1816 Last data filed at 05/15/17 3614  Gross per 24 hour  Intake              470 ml  Output              700 ml  Net             -230 ml   Filed Weights   05/12/17 2046 05/14/17 0500  Weight: 82.2 kg (181 lb 3.5 oz) 87.5 kg (192 lb 14.4 oz)    Exam: Patient is examined daily including today on 05/15/2017, exam remain the same as of yesterday except the changes   General:  NAD, aaox3,   Cardiovascular: IRRR, rate controlled  Respiratory: CTABL  Abdomen: Soft/ND/NT, positive BS  Musculoskeletal: No Edema  Neuro: alert, able to provide history, mild left side weakness , left arm weakness is actually improving.  Data Reviewed: Basic Metabolic Panel:  Recent Labs Lab 05/12/17 0155 05/12/17 0203 05/12/17 1041 05/13/17 0700 05/14/17 0304 05/15/17 0430  NA 143 147* 143 142 142 141  K 4.4 4.3 4.1 3.4* 3.5 3.3*  CL 112* 110 113* 111 111 112*  CO2 23  --  21* 20* 25 22  GLUCOSE 104* 97 90 82 98 86  BUN 9 13 11 8 7 7   CREATININE 0.90 0.70 0.73 0.78 0.82 0.80  CALCIUM 8.4*  --  8.1* 8.0* 7.9* 8.0*  MG  --   --   --   --  1.6* 1.8   Liver Function Tests:  Recent Labs Lab 05/12/17 0155 05/14/17 0304  AST 50* 35  ALT 17 13*  ALKPHOS 68 61  BILITOT 1.4* 1.0  PROT 6.3* 5.4*  ALBUMIN 2.8* 2.3*   No results for input(s): LIPASE, AMYLASE in the last 168 hours.  Recent Labs Lab 05/15/17 0430  AMMONIA 39*   CBC:  Recent Labs Lab 05/12/17 0155 05/12/17 0203 05/13/17 0700 05/14/17 0304 05/15/17 0430  WBC 5.5  --  3.1* 3.2* 3.0*  NEUTROABS 1.7  --   --   --   --   HGB 12.9* 13.6 12.6* 11.8* 11.5*  HCT 37.1* 40.0 36.6* 34.6* 33.4*  MCV 87.3  --  88.2 87.8 87.2  PLT 132*  --  97* 110* 115*   Cardiac Enzymes:   No results for input(s): CKTOTAL, CKMB, CKMBINDEX, TROPONINI in the last 168 hours. BNP (last 3 results)  Recent Labs  06/09/16 1500  08/20/16 2041  BNP 463.2* 352.6*    ProBNP (last 3 results) No results for input(s): PROBNP in the last 8760 hours.  CBG:  Recent Labs Lab 05/12/17 0150  GLUCAP 99    No results found for this or any previous visit (from the past 240 hour(s)).   Studies: No results found.  Scheduled Meds: . apixaban  5 mg Oral BID  . folic acid  1 mg Oral Daily  . levETIRAcetam  1,000 mg Oral BID  . levothyroxine  50 mcg Oral QAC breakfast  . magnesium oxide  400 mg Oral Daily  . metoprolol tartrate  12.5 mg Oral BID  . sodium chloride flush  3 mL Intravenous Q12H  Continuous Infusions:    I have personally reviewed and interpreted daily labs, tele strips as discussed above under date review session and assessment and plans. Case discussed with social worker on the phone.   Nathan Henney MD, PhD  [facility] Pager [phone]. If 7PM-7AM, please contact night-coverage at [email or web address], password [facility] 05/15/2017, 6:16 PM  LOS: 3 days

## 2017-05-15 NOTE — Progress Notes (Addendum)
3pm-CSW provided patient's son SNF offers. He will review them and let CSW know. Patient needs [place] before SNF admission and they are closed until Monday.   11am-CSW left patient's son a voicemail to contact CSW regarding SNF choices.  Percell Locus Samanthamarie Ezzell LCSWA [phone]

## 2017-05-16 LAB — CBC WITH DIFFERENTIAL/PLATELET
BASOS ABS: 0 10*3/uL (ref 0.0–0.1)
BASOS PCT: 0 %
EOS ABS: 0.1 10*3/uL (ref 0.0–0.7)
Eosinophils Relative: 3 %
HCT: 33.3 % — ABNORMAL LOW (ref 39.0–52.0)
HEMOGLOBIN: 11.7 g/dL — AB (ref 13.0–17.0)
LYMPHS PCT: 51 %
Lymphs Abs: 1.6 10*3/uL (ref 0.7–4.0)
MCH: 31.4 pg (ref 26.0–34.0)
MCHC: 35.1 g/dL (ref 30.0–36.0)
MCV: 89.3 fL (ref 78.0–100.0)
Monocytes Absolute: 0.4 10*3/uL (ref 0.1–1.0)
Monocytes Relative: 14 %
NEUTROS PCT: 32 %
Neutro Abs: 1 10*3/uL — ABNORMAL LOW (ref 1.7–7.7)
PLATELETS: 110 10*3/uL — AB (ref 150–400)
RBC: 3.73 MIL/uL — AB (ref 4.22–5.81)
RDW: 17 % — ABNORMAL HIGH (ref 11.5–15.5)
WBC: 3.1 10*3/uL — AB (ref 4.0–10.5)

## 2017-05-16 LAB — COMPREHENSIVE METABOLIC PANEL
ALBUMIN: 2.2 g/dL — AB (ref 3.5–5.0)
ALK PHOS: 63 U/L (ref 38–126)
ALT: 12 U/L — AB (ref 17–63)
AST: 25 U/L (ref 15–41)
Anion gap: 5 (ref 5–15)
BUN: 6 mg/dL (ref 6–20)
CALCIUM: 8.1 mg/dL — AB (ref 8.9–10.3)
CHLORIDE: 113 mmol/L — AB (ref 101–111)
CO2: 23 mmol/L (ref 22–32)
CREATININE: 0.79 mg/dL (ref 0.61–1.24)
GFR calc Af Amer: >60 mL/min (ref >60)
GFR calc non Af Amer: >60 mL/min (ref >60)
GLUCOSE: 79 mg/dL (ref 65–99)
Potassium: 3.5 mmol/L (ref 3.5–5.1)
SODIUM: 141 mmol/L (ref 135–145)
Total Bilirubin: 1.1 mg/dL (ref 0.3–1.2)
Total Protein: 5.5 g/dL — ABNORMAL LOW (ref 6.5–8.1)

## 2017-05-16 LAB — AMMONIA: Ammonia: 37 umol/L — ABNORMAL HIGH (ref 9–35)

## 2017-05-16 LAB — MAGNESIUM: Magnesium: 1.7 mg/dL (ref 1.7–2.4)

## 2017-05-16 MED ORDER — ACETAMINOPHEN 500 MG PO TABS
500.0000 mg | ORAL_TABLET | Freq: Three times a day (TID) | ORAL | Status: DC | PRN
  Administered 2017-05-16 – 2017-05-17 (×3): 500 mg via ORAL
  Filled 2017-05-16 (×3): qty 1

## 2017-05-16 MED ORDER — ZOLPIDEM TARTRATE 5 MG PO TABS
5.0000 mg | ORAL_TABLET | Freq: Every evening | ORAL | Status: DC | PRN
  Administered 2017-05-16 (×2): 5 mg via ORAL
  Filled 2017-05-16 (×2): qty 1

## 2017-05-16 MED ORDER — LACTULOSE 10 GM/15ML PO SOLN
10.0000 g | Freq: Two times a day (BID) | ORAL | Status: DC
  Administered 2017-05-16 – 2017-05-17 (×2): 10 g via ORAL
  Filled 2017-05-16 (×3): qty 15

## 2017-05-16 MED ORDER — LIDOCAINE 5 % EX PTCH
1.0000 | MEDICATED_PATCH | CUTANEOUS | Status: AC
  Administered 2017-05-16: 1 via TRANSDERMAL
  Filled 2017-05-16: qty 1

## 2017-05-16 NOTE — Progress Notes (Signed)
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[place]) Active Problems:   Hepatitis C   History of CVA (cerebrovascular accident)   Atrial fibrillation, chronic (HCC)   History of alcohol abuse   Hypothyroidism   Chronic systolic congestive heart failure, NYHA class 1 (HCC)   Heroin abuse   Partial seizure disorder ([place])   New onset partial seizure disorder --Patient presents with witnessed left-sided seizure activity that has resolved with administration of Ativan, Keppra and fosphenytoin -EEG suggestive of temporal lobe etiology to patient's seizure disorder --he is started on Keppra 500 IV every 12, he is improving, change to oral keppra on 8/11 --Seizure precautions, no more seizure in the hospital ----neurology input appreciated, thought recurrent embolic cva cause seizure onset  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 Question emboli -On eliquis prior to admission-?? Compliance, he initially states that " my heart doctor cut me down to once a day" then he states he run out elliquis for a few days. -CTA head and neck with mild disease, no significant stenosis --case discussed with neurology on 8/9 at bedside who recommend no need of TEE due to established eombolic cva with afib, also discussed the possibility of left atrial apendage closure with neurology Dr Leonie Man who recommend will need patient to recover from this stroke and reassess patient's baseline function prior to decide on whether patient will be a candidate for the procedure -PT/OT/SP  evaluation -case discussed with neurology on 8/10 over the phone, per neurology Dr Leonie Man, no need of asa, consider DTI with pradaxa if patient able to afford, or can continue Xa inhibitor eliquis bid (since there is a question of compliance issues) -SNF placement pending, awaiting passar , hopefully Monday discharge  Chronic atrial fibrillation -tele reviewed and interpreted daily, in aifb/ rate controlled -was on Lopressor IV scheduled, now able to take oral, transition back to oral meds -CHADVASC=5 -on eliquis ( question of noncompliance)  Chronic systolic heart failure EF 40-45%/moderate to severe tricuspid regurgitation -Currently compensated, d/c ivf -d/c iv betablocker, change back to oral meds   Ongoing alcohol abuse/history of heroin abuse -Patient's states drank wine last yesterday evening so doubt seizures related to alcohol withdrawal -Alcohol level <5 -UDS negative  Hepatitis C/alcohol cirrhosis/hepatic encephalopathy/ chronic thrombocytopenia/prior h/o gi bleed -Mild elevation AST 50 on admission likely related to ongoing alcohol abuse, lft normalized -Platelets are stable with usual baseline readings between 71,001- 148,000 -he was referred to infection disease in the past for hep c management, not sure if patient had followed up. Advise patient to follow up with infectious disease (contact info provided) -denies recent gi bleed -elevated ammonia level, intermittent mild confusion, increase lactulose to bid, repeat ammonia level in am  Hypothyroidism -was on iv Synthroid , changed back to oral supplement -TSH 1.3    DVT prophylaxis: Eliquis Code Status: Full Family Communication: No family at bedside  Disposition Plan: SNF Consults called: Neurology   Procedures:  none  Antibiotics:  none   Objective: BP (!) 146/94 (BP Location: Left Arm)   Pulse 68   Temp 97.8 F (36.6 C) (Oral)  Resp 18   Ht 5\' 11"  (1.803 m)   Wt 87.5 kg (192 lb 14.4  oz)   SpO2 95%   BMI 26.90 kg/m   Intake/Output Summary (Last 24 hours) at 05/16/17 0748 Last data filed at 05/16/17 0513  Gross per 24 hour  Intake              240 ml  Output              350 ml  Net             -110 ml   Filed Weights   05/12/17 2046 05/14/17 0500  Weight: 82.2 kg (181 lb 3.5 oz) 87.5 kg (192 lb 14.4 oz)    Exam: Patient is examined daily including today on 05/16/2017, exam remain the same as of yesterday except the changes   General:  NAD, aaox3,   Cardiovascular: IRRR, rate controlled  Respiratory: CTABL  Abdomen: Soft/ND/NT, positive BS  Musculoskeletal: No Edema  Neuro: alert, able to provide history, mild left side weakness , left arm weakness is actually improving, now able to lift above shoulder  Data Reviewed: Basic Metabolic Panel:  Recent Labs Lab 05/12/17 0155 05/12/17 0203 05/12/17 1041 05/13/17 0700 05/14/17 0304 05/15/17 0430  NA 143 147* 143 142 142 141  K 4.4 4.3 4.1 3.4* 3.5 3.3*  CL 112* 110 113* 111 111 112*  CO2 23  --  21* 20* 25 22  GLUCOSE 104* 97 90 82 98 86  BUN 9 13 11 8 7 7   CREATININE 0.90 0.70 0.73 0.78 0.82 0.80  CALCIUM 8.4*  --  8.1* 8.0* 7.9* 8.0*  MG  --   --   --   --  1.6* 1.8   Liver Function Tests:  Recent Labs Lab 05/12/17 0155 05/14/17 0304  AST 50* 35  ALT 17 13*  ALKPHOS 68 61  BILITOT 1.4* 1.0  PROT 6.3* 5.4*  ALBUMIN 2.8* 2.3*   No results for input(s): LIPASE, AMYLASE in the last 168 hours.  Recent Labs Lab 05/15/17 0430 05/16/17 0620  AMMONIA 39* 37*   CBC:  Recent Labs Lab 05/12/17 0155 05/12/17 0203 05/13/17 0700 05/14/17 0304 05/15/17 0430  WBC 5.5  --  3.1* 3.2* 3.0*  NEUTROABS 1.7  --   --   --   --   HGB 12.9* 13.6 12.6* 11.8* 11.5*  HCT 37.1* 40.0 36.6* 34.6* 33.4*  MCV 87.3  --  88.2 87.8 87.2  PLT 132*  --  97* 110* 115*   Cardiac Enzymes:   No results for input(s): CKTOTAL, CKMB, CKMBINDEX, TROPONINI in the last 168 hours. BNP (last 3  results)  Recent Labs  06/09/16 1500 08/20/16 2041  BNP 463.2* 352.6*    ProBNP (last 3 results) No results for input(s): PROBNP in the last 8760 hours.  CBG:  Recent Labs Lab 05/12/17 0150  GLUCAP 99    No results found for this or any previous visit (from the past 240 hour(s)).   Studies: No results found.  Scheduled Meds: . apixaban  5 mg Oral BID  . folic acid  1 mg Oral Daily  . lactulose  10 g Oral BID  . levETIRAcetam  1,000 mg Oral BID  . levothyroxine  50 mcg Oral QAC breakfast  . magnesium oxide  400 mg Oral Daily  . metoprolol tartrate  12.5 mg Oral BID  . sodium chloride flush  3 mL Intravenous Q12H    Continuous Infusions:  I have personally reviewed and interpreted daily labs, tele strips as discussed above under date review session and assessment and plans.    Jenesa Foresta MD, PhD  [facility] Pager [phone]. If 7PM-7AM, please contact night-coverage at [email or web address], password [facility] 05/16/2017, 7:48 AM  LOS: 4 days

## 2017-05-16 NOTE — Progress Notes (Signed)
Patient complaining of pain, refuses tylenol d/t liver disease. Also requesting ambien for sleep. Paged K. Sophie with [facility]. Awaiting orders if appropriate.

## 2017-05-17 ENCOUNTER — Encounter: Payer: Self-pay | Admitting: Internal Medicine

## 2017-05-17 DIAGNOSIS — Z87898 Personal history of other specified conditions: Secondary | ICD-10-CM | POA: Diagnosis not present

## 2017-05-17 DIAGNOSIS — I5022 Chronic systolic (congestive) heart failure: Secondary | ICD-10-CM | POA: Diagnosis not present

## 2017-05-17 DIAGNOSIS — I10 Essential (primary) hypertension: Secondary | ICD-10-CM | POA: Diagnosis not present

## 2017-05-17 DIAGNOSIS — G8114 Spastic hemiplegia affecting left nondominant side: Secondary | ICD-10-CM | POA: Diagnosis not present

## 2017-05-17 DIAGNOSIS — I482 Chronic atrial fibrillation: Secondary | ICD-10-CM | POA: Diagnosis not present

## 2017-05-17 DIAGNOSIS — R569 Unspecified convulsions: Secondary | ICD-10-CM | POA: Diagnosis not present

## 2017-05-17 DIAGNOSIS — Z8673 Personal history of transient ischemic attack (TIA), and cerebral infarction without residual deficits: Secondary | ICD-10-CM | POA: Diagnosis not present

## 2017-05-17 DIAGNOSIS — K7460 Unspecified cirrhosis of liver: Secondary | ICD-10-CM | POA: Diagnosis not present

## 2017-05-17 DIAGNOSIS — I5020 Unspecified systolic (congestive) heart failure: Secondary | ICD-10-CM | POA: Diagnosis not present

## 2017-05-17 DIAGNOSIS — L03119 Cellulitis of unspecified part of limb: Secondary | ICD-10-CM | POA: Diagnosis not present

## 2017-05-17 DIAGNOSIS — I639 Cerebral infarction, unspecified: Secondary | ICD-10-CM | POA: Diagnosis not present

## 2017-05-17 DIAGNOSIS — E039 Hypothyroidism, unspecified: Secondary | ICD-10-CM | POA: Diagnosis not present

## 2017-05-17 LAB — CBC WITH DIFFERENTIAL/PLATELET
BASOS ABS: 0 10*3/uL (ref 0.0–0.1)
BASOS PCT: 0 %
EOS ABS: 0.1 10*3/uL (ref 0.0–0.7)
Eosinophils Relative: 2 %
HEMATOCRIT: 34 % — AB (ref 39.0–52.0)
HEMOGLOBIN: 12 g/dL — AB (ref 13.0–17.0)
LYMPHS PCT: 52 %
Lymphs Abs: 1.6 10*3/uL (ref 0.7–4.0)
MCH: 31.4 pg (ref 26.0–34.0)
MCHC: 35.3 g/dL (ref 30.0–36.0)
MCV: 89 fL (ref 78.0–100.0)
MONO ABS: 0.5 10*3/uL (ref 0.1–1.0)
Monocytes Relative: 15 %
NEUTROS PCT: 31 %
Neutro Abs: 1 10*3/uL — ABNORMAL LOW (ref 1.7–7.7)
Platelets: 132 10*3/uL — ABNORMAL LOW (ref 150–400)
RBC: 3.82 MIL/uL — ABNORMAL LOW (ref 4.22–5.81)
RDW: 16.9 % — ABNORMAL HIGH (ref 11.5–15.5)
WBC: 3.2 10*3/uL — AB (ref 4.0–10.5)

## 2017-05-17 LAB — BASIC METABOLIC PANEL
ANION GAP: 6 (ref 5–15)
BUN: 5 mg/dL — ABNORMAL LOW (ref 6–20)
CALCIUM: 8.3 mg/dL — AB (ref 8.9–10.3)
CHLORIDE: 110 mmol/L (ref 101–111)
CO2: 24 mmol/L (ref 22–32)
CREATININE: 0.75 mg/dL (ref 0.61–1.24)
GFR calc non Af Amer: >60 mL/min (ref >60)
Glucose, Bld: 77 mg/dL (ref 65–99)
Potassium: 4.1 mmol/L (ref 3.5–5.1)
SODIUM: 140 mmol/L (ref 135–145)

## 2017-05-17 LAB — MAGNESIUM: MAGNESIUM: 1.6 mg/dL — AB (ref 1.7–2.4)

## 2017-05-17 LAB — AMMONIA: AMMONIA: 46 umol/L — AB (ref 9–35)

## 2017-05-17 MED ORDER — DABIGATRAN ETEXILATE MESYLATE 150 MG PO CAPS: 150.0000 mg | ORAL_CAPSULE | Freq: Two times a day (BID) | ORAL | 0 refills | Status: DC

## 2017-05-17 MED ORDER — LACTULOSE 10 GM/15ML PO SOLN
10.0000 g | Freq: Two times a day (BID) | ORAL | 0 refills | Status: DC
Start: 2017-05-17 — End: 2017-06-24

## 2017-05-17 MED ORDER — DABIGATRAN ETEXILATE MESYLATE 150 MG PO CAPS
150.0000 mg | ORAL_CAPSULE | Freq: Two times a day (BID) | ORAL | Status: DC
  Administered 2017-05-17: 150 mg via ORAL
  Filled 2017-05-17: qty 1

## 2017-05-17 MED ORDER — LEVETIRACETAM 1000 MG PO TABS: 1000.0000 mg | ORAL_TABLET | Freq: Two times a day (BID) | ORAL | 0 refills | Status: DC

## 2017-05-17 MED ORDER — MAGNESIUM SULFATE 2 GM/50ML IV SOLN
2.0000 g | Freq: Once | INTRAVENOUS | Status: AC
  Administered 2017-05-17: 2 g via INTRAVENOUS
  Filled 2017-05-17: qty 50

## 2017-05-17 NOTE — Progress Notes (Signed)
Physical Therapy Treatment Patient Details Name: Nathan Frank MRN: [id number] DOB: 26-Aug-1941 Today's Date: 05/17/2017    History of Present Illness Pt is a 76 y/o male admitted secondary to new onset seizures with left face, arm and leg were twitching. PMH including but not limited to alcohol abuse, HTN, Hep C and hx of CVA (R MCA and right frontal left parietal infarcts)    PT Comments    Pt progressing towards goals. Increased tolerance for mobility to chair this session. Required mod A +2 for safety, balance, and sequencing with RW during transfer. Decreased motor planning noted as well, as pt required VCs and demonstration to perform BLE exercise correctly. Current recommendations remain appropriate. Will continue to follow acutely to maximize functional mobility independence.    Follow Up Recommendations  SNF;Supervision/Assistance - 24 hour     Equipment Recommendations  None recommended by PT    Recommendations for Other Services       Precautions / Restrictions Precautions Precautions: Fall Precaution Comments: seizure Restrictions Weight Bearing Restrictions: No    Mobility  Bed Mobility Overal bed mobility: Needs Assistance Bed Mobility: Supine to Sit     Supine to sit: Min assist     General bed mobility comments: Min A for trunk elevation. use of bed rails and elevated HOB   Transfers Overall transfer level: Needs assistance Equipment used: Rolling walker (2 wheeled) Transfers: Sit to/from [facility] Sit to Stand: Mod assist Stand pivot transfers: Mod assist;+2 physical assistance       General transfer comment: Mod A for lift assist to stand. Required verbal and manual cues to stand upright. Mod A +2 for stand pivot to chair. Decreased initiation with transfer, therefore, needed assist with RW management and cues for taking steps to chair. Verbal cues for upright posture throughout.   Ambulation/Gait                  Stairs            Wheelchair Mobility    Modified Rankin (Stroke Patients Only)       Balance Overall balance assessment: Needs assistance Sitting-balance support: No upper extremity supported;Feet supported Sitting balance-Leahy Scale: Fair     Standing balance support: During functional activity;Bilateral upper extremity supported Standing balance-Leahy Scale: Poor Standing balance comment: Mod A +2 and RW needed for balance for stand pivot.                             Cognition Arousal/Alertness: Awake/alert Behavior During Therapy: Flat affect Overall Cognitive Status: No family/caregiver present to determine baseline cognitive functioning Area of Impairment: Problem solving;Following commands                       Following Commands: Follows one step commands with increased time Safety/Judgement: Decreased awareness of safety;Decreased awareness of deficits   Problem Solving: Slow processing;Decreased initiation;Difficulty sequencing;Requires verbal cues;Requires tactile cues General Comments: Improved sequencing noted with mobility, however, continues to exhibit decreased sequencing and initiation and required verbal and manual cues throughout.       Exercises General Exercises - Lower Extremity Long Arc Quad: AROM;Both;10 reps;Seated (VCs and demonstration for technique ) Hip Flexion/Marching: AROM;Both;10 reps;Seated (VCs and demonstration for technique ) Toe Raises: AROM;Both;10 reps;Seated (VCs and demonstration for technique ) Heel Raises: AROM;Both;10 reps;Seated (VCs and demonstration for technique )    General Comments        Pertinent  Vitals/Pain Pain Assessment: No/denies pain    Home Living                      Prior Function            PT Goals (current goals can now be found in the care plan section) Acute Rehab PT Goals Patient Stated Goal: To get better  PT Goal Formulation: With patient Time For  Goal Achievement: 05/27/17 Potential to Achieve Goals: Fair Progress towards PT goals: Progressing toward goals    Frequency    Min 3X/week      PT Plan Current plan remains appropriate    Co-evaluation              AM-PAC PT "6 Clicks" Daily Activity  Outcome Measure  Difficulty turning over in bed (including adjusting bedclothes, sheets and blankets)?: Total Difficulty moving from lying on back to sitting on the side of the bed? : Total Difficulty sitting down on and standing up from a chair with arms (e.g., wheelchair, bedside commode, etc,.)?: Total Help needed moving to and from a bed to chair (including a wheelchair)?: A Lot Help needed walking in hospital room?: A Lot Help needed climbing 3-5 steps with a railing? : Total 6 Click Score: 8    End of Session Equipment Utilized During Treatment: Gait belt Activity Tolerance: Patient tolerated treatment well Patient left: in chair;with call bell/phone within reach;with chair alarm set Nurse Communication: Mobility status PT Visit Diagnosis: Unsteadiness on feet (R26.81);Other abnormalities of gait and mobility (R26.89)     Time: 8101-7510 PT Time Calculation (min) (ACUTE ONLY): 19 min  Charges:  $Therapeutic Activity: 8-22 mins                    G Codes:       Leighton Ruff, PT, DPT  Acute Rehabilitation Services  Pager: [phone]    Rudean Hitt 05/17/2017, 3:27 PM

## 2017-05-17 NOTE — Discharge Summary (Signed)
Discharge Summary  Nathan Frank LYY:[id number] DOB: 10/11/1940  PCP: Thressa Sheller, MD  Admit date: 05/12/2017 Discharge date: 05/17/2017  Time spent: >76mins, more than 50% time spent on coordination of care  Recommendations for Outpatient Follow-up:  1. F/u with SNF MD  for hospital discharge follow up, repeat cbc/bmp at follow up 2. F/u with neurology  For cva 3. F/u with cardiology for afib 4. F/u with infectious disease for hepC  Discharge Diagnoses:  Active Hospital Problems   Diagnosis Date Noted  . 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 Hepatitis C 05/12/2017  . Atrial fibrillation, chronic ([place]) 05/12/2017  . History of alcohol abuse 05/12/2017  . Hypothyroidism 05/12/2017  . Chronic systolic congestive heart failure, NYHA class 1 ([place]) 05/12/2017  . Heroin abuse 05/12/2017  . Partial seizure disorder ([place]) 05/12/2017  . History of CVA (cerebrovascular accident) 06/05/2016    Resolved Hospital Problems   Diagnosis Date Noted Date Resolved  No resolved problems to display.    Discharge Condition: stable  Diet recommendation: heart healthy  Filed Weights   05/12/17 2046 05/14/17 0500  Weight: 82.2 kg (181 lb 3.5 oz) 87.5 kg (192 lb 14.4 oz)    History of present illness:  PCP: Thressa Sheller, MD   Attending physician: Marily Memos  Patient coming from/Resides with: Private residence/wife  Chief Complaint: New onset seizures  HPI: Nathan Frank is a 76 y.o. male with medical history significant for hepatitis C, alcohol abuse, history of CVA with residual left hemiparesis, nonischemic cardiomyopathy with an EF of 40-45%, hypothyroidism, atrial fibrillation eliquis, and history of noncompliance with medical therapies.patient was sent to the ER after his wife noticed that his left face, arm and leg were twitching. Given patient's history of CVA a code stroke was initiated. Upon arrival to the ER he was able to answer most questions  appropriately but was noted with a leftward gaze, left facialroop, left facial,left arm and left leg twitching.initial CT of the head was unremarkable. He was initially given 2 doses of IV Ativan but twitching persisted therefore he was given 1 g IV Keppra followed by fosphenytoin 1200 mg. The seizures finally broke after completion of the fosphenytoin. He has been evaluated by neurology who suspects he has developed simple partial seizures in the setting of his prior stroke. Recommendations given for medical therapies as well as additional diagnostic evaluation.we have been asked to admit the patient.  ED Course:  Vital Signs: BP (!) 124/96   Pulse (!) 105   Temp 97.6 F (36.4 C)   Resp 15   SpO2 96%  CT head without contrast: No acute intracranial process on a moderate to severely motion degraded examination  Lab data: Sodium 143, potassium 4.4, chloride 112, CO2 23, glucose 14, BUN 9, creatinine 0.9, albumin 2.8, AST 50, total bilirubin 1.4, poc troponin 0.05, white count 5500 with differential, hemoglobin 12.9, platelets 132,000; PTT 23.9, INR 2.9, PTT 28 Medications and treatments: Ativan 1 mg IV 2 doses, Keppra 1 g IV 1, fosphenytoin 1200 mg IV 1   Hospital Course:  Principal Problem:   Simple partial seizure disorder (HCC) Active Problems:   Hepatitis C   History of CVA (cerebrovascular accident)   Atrial fibrillation, chronic (HCC)   History of alcohol abuse   Hypothyroidism   Chronic systolic congestive heart failure, NYHA class 1 (HCC)   Heroin abuse   Partial seizure disorder ([place])   New onset partial seizure disorder --Patient presents with witnessed left-sided seizure activity that  has resolved with administration of Ativan, Keppra and fosphenytoin -EEGsuggestive of temporal lobe etiology to patient's seizure disorder --he is started on Keppra 500 IV every 12, he is improving, change to oral keppra on 8/11 --Seizure precautions, no more seizure in the  hospital ----neurology input appreciated, thought recurrent embolic cva cause seizure onset  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 Question emboli -On eliquis prior to admission-??Compliance, he initially states that " my heart doctor cut me down to once a day" then he states he run out elliquis for a few days. -CTA head and neck with mild disease, no significant stenosis --case discussed with neurology on 8/9 at bedside who recommend no need of TEE due to established eombolic cva with afib, also discussed the possibility of left atrial apendage closure with neurology Dr Leonie Man who recommend will need patient to recover from this stroke and reassess patient's baseline function prior to decide on whether patient will be a candidate for the procedure -PT/OT/SP evaluation -case discussed with neurology on 8/10 over the phone, per neurology Dr Leonie Man, no need of asa, consider DTI with pradaxa if patient able to afford, or can continue Xa inhibitor eliquis bid (since there is a question of compliance issues) -he is discharged on pradaxa 150mg  bid, -d/c to SNF, neurology follow up.  Chronic atrial fibrillation -tele reviewed and interpreted daily, in aifb/ rate controlled -was on Lopressor IV scheduled, now able to take oral, transition back to oral meds -CHADVASC=5 -was on eliquis ( question of noncompliance), changed to pradaxa  -cardiology follow up.  Chronic systolic heart failure DJ24-26%/STMHDQQI to severe tricuspid regurgitation -Currently compensated, d/c ivf -d/c iv betablocker, change back to oral meds -euvolemic at discharge   Ongoing alcohol abuse/history of heroin abuse -Patient's states drank wine last yesterday evening so doubt seizures related to alcohol withdrawal -Alcohol level <5 -UDS  negative  Hepatitis C/alcohol cirrhosis/hepatic encephalopathy/ chronic thrombocytopenia/prior h/o gi bleed -Mild elevation AST 50 on admission likely related to ongoing alcohol abuse, lft normalized -Platelets are stable with usual baseline readings between 71,001- 148,000 -he was referred to infection disease in the past for hep c management, not sure if patient had followed up. Advise patient to follow up with infectious disease (contact info provided) -denies recent gi bleed -mild elevated ammonia level, intermittent mild confusion, increase lactulose to bid, aaox3 at time of discharge.  Hypothyroidism -was on iv Synthroid , changed back to oral supplement -TSH 1.3    DVT prophylaxis:Eliquis then pradaxa Code Status:Full Family Communication:called the son prior to discharge and left message.  Disposition Plan:SNF Consults called:Neurology   Procedures:  none  Antibiotics:  none   Discharge Exam: BP (!) 161/95 (BP Location: Left Arm)   Pulse 81   Temp 97.9 F (36.6 C) (Oral)   Resp 16   Ht 5\' 11"  (1.803 m)   Wt 87.5 kg (192 lb 14.4 oz)   SpO2 98%   BMI 26.90 kg/m   General: NAD Cardiovascular: IRRR Respiratory: CTABL Neuro: aaox3, mild left side weakness , left arm weakness is actually improving, now able to lift above shoulder, no seizure  Discharge Instructions You were cared for by a hospitalist during your hospital stay. If you have any questions about your discharge medications or the care you received while you were in the hospital after you are discharged, you can  call the unit and asked to speak with the hospitalist on call if the hospitalist that took care of you is not available. Once you are discharged, your primary care physician will handle any further medical issues. Please note that NO REFILLS for any discharge medications will be authorized once you are discharged, as it is imperative that you return to your primary care physician (or  establish a relationship with a primary care physician if you do not have one) for your aftercare needs so that they can reassess your need for medications and monitor your lab values.  Discharge Instructions    Ambulatory referral to Neurology    Complete by:  As directed    An appointment is requested in approximately: 4 weeks   Diet - low sodium heart healthy    Complete by:  As directed    Increase activity slowly    Complete by:  As directed      Allergies as of 05/17/2017      Reactions   Nsaids Other (See Comments)   Acute gastric ulcers   Oxycodone Other (See Comments)   Per the risk calculating tool RIOSORD: (Risk Index for Overdose or Serious Opioid -induced Respiratory Depression Risk ) calculated risk in ensuing 6 months  = 83% ( see Problem List for discussion)      Medication List    STOP taking these medications   apixaban 5 MG Tabs tablet Commonly known as:  ELIQUIS     TAKE these medications   dabigatran 150 MG Caps capsule Commonly known as:  PRADAXA Take 1 capsule (150 mg total) by mouth every 12 (twelve) hours.   folic acid 1 MG tablet Commonly known as:  FOLVITE Take 1 mg by mouth daily.   lactulose 10 GM/15ML solution Commonly known as:  CHRONULAC Take 15 mLs (10 g total) by mouth 2 (two) times daily.   levETIRAcetam 1000 MG tablet Commonly known as:  KEPPRA Take 1 tablet (1,000 mg total) by mouth 2 (two) times daily.   levothyroxine 50 MCG tablet Commonly known as:  SYNTHROID, LEVOTHROID Take 50 mcg by mouth daily before breakfast.   magnesium oxide 400 (241.3 Mg) MG tablet Commonly known as:  MAG-OX Take 1 tablet (400 mg total) by mouth daily.   Melatonin 5 MG Caps Take 1 capsule by mouth at bedtime.   metoprolol tartrate 25 MG tablet Commonly known as:  LOPRESSOR Take 0.5 tablets (12.5 mg total) by mouth 2 (two) times daily.   THIAMINE HCL PO Take 1 tablet by mouth daily.   VITAMIN D-3 PO Take 1 capsule by mouth daily.       Allergies  Allergen Reactions  . Nsaids Other (See Comments)    Acute gastric ulcers  . Oxycodone Other (See Comments)    Per the risk calculating tool RIOSORD: (Risk Index for Overdose or Serious Opioid -induced Respiratory Depression Risk ) calculated risk in ensuing 6 months  = 83% ( see Problem List for discussion)   Follow-up Information    Fay Records, MD Follow up on 06/04/2017.   Specialty:  Cardiology Contact information: [address] [place] [address] [phone]        [facility]              Follow up in 1 month(s).   Why:  to discuss heaptitis c management Contact information: [address] [place] [address]  [facility] Follow up in 4 week(s).   Why:  stroke clinic Contact information: [address]     [place] [address] [phone]           The results of significant diagnostics from this hospitalization (including imaging, microbiology, ancillary and laboratory) are listed below for reference.    Significant Diagnostic Studies: Ct Angio Head W Or Wo Contrast  Result Date: 05/12/2017 CLINICAL DATA:  Code stroke. Bilateral parietal and left frontal lobe infarcts. EXAM: CT ANGIOGRAPHY HEAD AND NECK TECHNIQUE: Multidetector CT imaging of the head and neck was performed using the standard protocol during bolus administration of intravenous contrast. Multiplanar CT image reconstructions and MIPs were obtained to evaluate the vascular anatomy. Carotid stenosis measurements (when applicable) are obtained utilizing NASCET criteria, using the distal internal carotid diameter as the denominator. CONTRAST:  50 mL Isovue 370 COMPARISON:  MRI brain from the same day. CT head without contrast from the same day. FINDINGS: CTA NECK FINDINGS Aortic arch: Minimal atherosclerotic calcifications are present in the distal aortic arch.  There is no significant stenosis of the great vessel origins. Right carotid system: The right common carotid artery is within normal limits. Minimal atherosclerotic changes are present the right carotid bifurcation. There is tortuosity of the cervical right internal carotid artery without a significant stenosis. The proximal cervical left internal carotid artery demonstrates mild irregularity without significant stenosis. Left carotid system: The left common carotid artery is within normal limits. The bifurcation is unremarkable. Mild tortuosity is present in the cervical left internal carotid artery without a significant stenosis. There is some irregularity of the cervical left internal carotid artery. Vertebral arteries: The vertebral arteries originate from the subclavian arteries bilaterally without significant stenosis. There is mild proximal tortuosity. Knee vertebral arteries are codominant. No focal stenosis is present in the neck. Skeleton: Vertebral body heights are normal. Alignment is anatomic. Mild facet degenerative changes are noted at C2-3 and C3-4. The patient is nearly edentulous. Other neck: The soft tissues of the neck are otherwise unremarkable. Upper chest: Marked pleural thickening and irregularity is present bilaterally. There is mild dependent atelectasis bilaterally. A prominent azygos vein is noted. Review of the MIP images confirms the above findings CTA HEAD FINDINGS Anterior circulation: Atherosclerotic changes are present within the cavernous internal carotid arteries bilaterally without significant stenosis. The left internal carotid artery is of smaller caliber than the right. The ICA termini are within normal limits bilaterally. The left A1 segment is hypoplastic. Both A1 segments fill from the right with a patent anterior communicating artery. The M1 segments are within normal limits. The left MCA branch vessels are intact although smaller in caliber than those on the right. This  may represent luxury perfusion on the right. There is no focal stenosis, aneurysm, or branch vessel occlusion within the ACA or MCA branch vessels. Posterior circulation: The vertebral arteries are codominant. The PICA origins are not discretely visualized. Prominent AICA vessels are noted bilaterally. The basilar artery is small. Both posterior cerebral arteries originate from basilar tip posterior communicating arteries are present bilaterally, right greater than left. The PCA branch vessels are within normal limits. Venous sinuses: The dural sinuses are patent. Straight sinus and deep cerebral veins are intact. Cortical veins are unremarkable. Anatomic variants: Prominent posterior communicating arteries. Hypoplastic left A1 segment. Delayed phase: The postcontrast images demonstrate no pathologic enhancement. The remote infarction well seen. Acute punctate infarcts are not well visualized. Review of the MIP images confirms the above  findings IMPRESSION: 1. Mild atherosclerotic changes at the right carotid bifurcation without significant stenosis. 2. ICA tortuosity is worse on the right. 3. Mild irregularity of the cervical internal carotid artery is bilaterally raises possibility of FMD. 4. Atherosclerotic calcifications within the cavernous internal carotid arteries bilaterally without significant stenosis Electronically Signed   By: San Morelle M.D.   On: 05/12/2017 13:32   Ct Angio Neck W Or Wo Contrast  Result Date: 05/12/2017 CLINICAL DATA:  Code stroke. Bilateral parietal and left frontal lobe infarcts. EXAM: CT ANGIOGRAPHY HEAD AND NECK TECHNIQUE: Multidetector CT imaging of the head and neck was performed using the standard protocol during bolus administration of intravenous contrast. Multiplanar CT image reconstructions and MIPs were obtained to evaluate the vascular anatomy. Carotid stenosis measurements (when applicable) are obtained utilizing NASCET criteria, using the distal internal  carotid diameter as the denominator. CONTRAST:  50 mL Isovue 370 COMPARISON:  MRI brain from the same day. CT head without contrast from the same day. FINDINGS: CTA NECK FINDINGS Aortic arch: Minimal atherosclerotic calcifications are present in the distal aortic arch. There is no significant stenosis of the great vessel origins. Right carotid system: The right common carotid artery is within normal limits. Minimal atherosclerotic changes are present the right carotid bifurcation. There is tortuosity of the cervical right internal carotid artery without a significant stenosis. The proximal cervical left internal carotid artery demonstrates mild irregularity without significant stenosis. Left carotid system: The left common carotid artery is within normal limits. The bifurcation is unremarkable. Mild tortuosity is present in the cervical left internal carotid artery without a significant stenosis. There is some irregularity of the cervical left internal carotid artery. Vertebral arteries: The vertebral arteries originate from the subclavian arteries bilaterally without significant stenosis. There is mild proximal tortuosity. Knee vertebral arteries are codominant. No focal stenosis is present in the neck. Skeleton: Vertebral body heights are normal. Alignment is anatomic. Mild facet degenerative changes are noted at C2-3 and C3-4. The patient is nearly edentulous. Other neck: The soft tissues of the neck are otherwise unremarkable. Upper chest: Marked pleural thickening and irregularity is present bilaterally. There is mild dependent atelectasis bilaterally. A prominent azygos vein is noted. Review of the MIP images confirms the above findings CTA HEAD FINDINGS Anterior circulation: Atherosclerotic changes are present within the cavernous internal carotid arteries bilaterally without significant stenosis. The left internal carotid artery is of smaller caliber than the right. The ICA termini are within normal limits  bilaterally. The left A1 segment is hypoplastic. Both A1 segments fill from the right with a patent anterior communicating artery. The M1 segments are within normal limits. The left MCA branch vessels are intact although smaller in caliber than those on the right. This may represent luxury perfusion on the right. There is no focal stenosis, aneurysm, or branch vessel occlusion within the ACA or MCA branch vessels. Posterior circulation: The vertebral arteries are codominant. The PICA origins are not discretely visualized. Prominent AICA vessels are noted bilaterally. The basilar artery is small. Both posterior cerebral arteries originate from basilar tip posterior communicating arteries are present bilaterally, right greater than left. The PCA branch vessels are within normal limits. Venous sinuses: The dural sinuses are patent. Straight sinus and deep cerebral veins are intact. Cortical veins are unremarkable. Anatomic variants: Prominent posterior communicating arteries. Hypoplastic left A1 segment. Delayed phase: The postcontrast images demonstrate no pathologic enhancement. The remote infarction well seen. Acute punctate infarcts are not well visualized. Review of the MIP images confirms the  above findings IMPRESSION: 1. Mild atherosclerotic changes at the right carotid bifurcation without significant stenosis. 2. ICA tortuosity is worse on the right. 3. Mild irregularity of the cervical internal carotid artery is bilaterally raises possibility of FMD. 4. Atherosclerotic calcifications within the cavernous internal carotid arteries bilaterally without significant stenosis Electronically Signed   By: San Morelle M.D.   On: 05/12/2017 13:32   Mr Jeri Cos IF Contrast  Result Date: 05/12/2017 CLINICAL DATA:  Seizure.  History of stroke EXAM: MRI HEAD WITHOUT AND WITH CONTRAST TECHNIQUE: Multiplanar, multiecho pulse sequences of the brain and surrounding structures were obtained without and with  intravenous contrast. CONTRAST:  17 mL MultiHance IV COMPARISON:  CT head 05/12/2017 FINDINGS: Brain: Image quality degraded by motion Small foci of acute infarct in the high right parietal lobe, right mid parietal lobe, high left frontal lobe, and left posterior parietal lobe. These areas measure approximately 1 cm each. Chronic right MCA infarct. Chronic left parietal infarct. Chronic microvascular ischemic change in the white matter. Negative for hemorrhage or mass. Generalized atrophy without hydrocephalus. Vascular: Normal arterial flow voids Skull and upper cervical spine: Negative Sinuses/Orbits: Negative Other: None IMPRESSION: Small foci of acute infarct in the right parietal lobe, left parietal lobe, and left frontal lobe. Question emboli Atrophy and chronic right MCA infarct and chronic left parietal infarct. Electronically Signed   By: Franchot Gallo M.D.   On: 05/12/2017 10:16   Ct Head Code Stroke Wo Contrast  Addendum Date: 05/12/2017   ADDENDUM REPORT: 05/12/2017 02:33 ADDENDUM: Critical Value/emergent results were called by telephone at the time of interpretation on 05/12/2017 at 2:30 am to Dr. Lorraine Lax, Neurology, who verbally acknowledged these results. Electronically Signed   By: Elon Alas M.D.   On: 05/12/2017 02:33   Result Date: 05/12/2017 CLINICAL DATA:  Code stroke. Altered level of consciousness. Seizure like activity. History of alcohol abuse, hypertension, cirrhosis, stroke. EXAM: CT HEAD WITHOUT CONTRAST TECHNIQUE: Contiguous axial images were obtained from the base of the skull through the vertex without intravenous contrast. COMPARISON:  CT HEAD November 22, 2016 FINDINGS: Moderate to severely motion degraded examination. BRAIN: No intraparenchymal hemorrhage, mass effect, midline shift. Old RIGHT basal ganglia infarct. Confluent RIGHT frontoparietal temporal lobe encephalomalacia. LEFT temporoparietal lobe encephalomalacia. Ex vacuo dilatation RIGHT lateral ventricle, no  hydrocephalus. No definite acute large vascular territory infarct though limited by patient motion. No abnormal extra-axial fluid collections. Basal cisterns are patent. VASCULAR: Moderate calcific atherosclerosis of the carotid siphons. SKULL: No skull fracture. No significant scalp soft tissue swelling. SINUSES/ORBITS: The mastoid air-cells and included paranasal sinuses are well-aerated.The included ocular globes and orbital contents are non-suspicious. OTHER: Patient is edentulous. ASPECTS [facility] Stroke Program Early CT Score) - Ganglionic level infarction (caudate, lentiform nuclei, internal capsule, insula, M1-M3 cortex): 7 - Supraganglionic infarction (M4-M6 cortex): 3 Total score (0-10 with 10 being normal): 10 IMPRESSION: 1. No acute intracranial process on this moderate to severely motion degraded examination. 2. Stable examination including bilateral MCA territory infarcts. 3. ASPECTS is 10 (sensitivity decreased due to motion). Dr. Lorraine Lax, Neurology paged by [facility] using standard paging system on 05/12/2017 at 2:15 am, awaiting return call. Electronically Signed: By: Elon Alas M.D. On: 05/12/2017 02:16    Microbiology: No results found for this or any previous visit (from the past 240 hour(s)).   Labs: Basic Metabolic Panel:  Recent Labs Lab 05/13/17 0700 05/14/17 0304 05/15/17 0430 05/16/17 0620 05/17/17 0341  NA 142 142 141 141 140  K 3.4* 3.5 3.3*  3.5 4.1  CL 111 111 112* 113* 110  CO2 20* 25 22 23 24   GLUCOSE 82 98 86 79 77  BUN 8 7 7 6  5*  CREATININE 0.78 0.82 0.80 0.79 0.75  CALCIUM 8.0* 7.9* 8.0* 8.1* 8.3*  MG  --  1.6* 1.8 1.7 1.6*   Liver Function Tests:  Recent Labs Lab 05/12/17 0155 05/14/17 0304 05/16/17 0620  AST 50* 35 25  ALT 17 13* 12*  ALKPHOS 68 61 63  BILITOT 1.4* 1.0 1.1  PROT 6.3* 5.4* 5.5*  ALBUMIN 2.8* 2.3* 2.2*   No results for input(s): LIPASE, AMYLASE in the last 168 hours.  Recent Labs Lab 05/15/17 0430 05/16/17 0620  05/17/17 0341  AMMONIA 39* 37* 46*   CBC:  Recent Labs Lab 05/12/17 0155  05/13/17 0700 05/14/17 0304 05/15/17 0430 05/16/17 0620 05/17/17 0341  WBC 5.5  --  3.1* 3.2* 3.0* 3.1* 3.2*  NEUTROABS 1.7  --   --   --   --  1.0* 1.0*  HGB 12.9*  < > 12.6* 11.8* 11.5* 11.7* 12.0*  HCT 37.1*  < > 36.6* 34.6* 33.4* 33.3* 34.0*  MCV 87.3  --  88.2 87.8 87.2 89.3 89.0  PLT 132*  --  97* 110* 115* 110* 132*  < > = values in this interval not displayed. Cardiac Enzymes: No results for input(s): CKTOTAL, CKMB, CKMBINDEX, TROPONINI in the last 168 hours. BNP: BNP (last 3 results)  Recent Labs  06/09/16 1500 08/20/16 2041  BNP 463.2* 352.6*    ProBNP (last 3 results) No results for input(s): PROBNP in the last 8760 hours.  CBG:  Recent Labs Lab 05/12/17 0150  GLUCAP 99       Signed:  Lylith Bebeau MD, PhD  [facility] 05/17/2017, 9:23 AM

## 2017-05-17 NOTE — Progress Notes (Signed)
Report given to Herbie Baltimore at [employer].

## 2017-05-17 NOTE — Care Management Note (Signed)
Case Management Note  Patient Details  Name: RAUNAK ANTUNA MRN: [id number] Date of Birth: 12/10/1940  Subjective/Objective:                    Action/Plan: Patient discharging to SNF today. No further needs per CM.  Expected Discharge Date:  05/17/17               Expected Discharge Plan:  Skilled Nursing Facility  In-House Referral:  Clinical Social Work  Discharge planning Services     Post Acute Care Choice:    Choice offered to:     DME Arranged:    DME Agency:     HH Arranged:    HH Agency:     Status of Service:  In process, will continue to follow  If discussed at Long Length of Stay Meetings, dates discussed:    Additional Comments:  Pollie Friar, RN 05/17/2017, 10:49 AM

## 2017-05-17 NOTE — Progress Notes (Addendum)
Discharge to: [facility] Anticipated discharge date: 05/17/17 Family notified: Yes, by phone Transportation by: PTAR  Report #: [phone], Room 106  CSW signing off.  Laveda Abbe LCSW [phone]

## 2017-05-17 NOTE — Progress Notes (Signed)
Patient left unit on stretcher accompanied by PTAR.  Patient alert, oriented with no complaints.

## 2017-05-17 NOTE — Progress Notes (Signed)
CSW has received PASRR number for patient. CSW has left voicemail for patient's son to select facility.  CSW will update when patient's son returns call and to facilitate discharge to SNF.  Laveda Abbe,  Clinical Social Worker [phone]

## 2017-05-18 DIAGNOSIS — I639 Cerebral infarction, unspecified: Secondary | ICD-10-CM | POA: Diagnosis not present

## 2017-05-18 DIAGNOSIS — G8114 Spastic hemiplegia affecting left nondominant side: Secondary | ICD-10-CM | POA: Diagnosis not present

## 2017-05-18 DIAGNOSIS — R569 Unspecified convulsions: Secondary | ICD-10-CM | POA: Diagnosis not present

## 2017-05-18 DIAGNOSIS — K7460 Unspecified cirrhosis of liver: Secondary | ICD-10-CM | POA: Diagnosis not present

## 2017-05-18 DIAGNOSIS — I482 Chronic atrial fibrillation: Secondary | ICD-10-CM | POA: Diagnosis not present

## 2017-05-18 LAB — PATHOLOGIST SMEAR REVIEW

## 2017-05-24 DIAGNOSIS — G8114 Spastic hemiplegia affecting left nondominant side: Secondary | ICD-10-CM | POA: Diagnosis not present

## 2017-05-25 DIAGNOSIS — I482 Chronic atrial fibrillation: Secondary | ICD-10-CM | POA: Diagnosis not present

## 2017-05-25 DIAGNOSIS — R569 Unspecified convulsions: Secondary | ICD-10-CM | POA: Diagnosis not present

## 2017-05-25 DIAGNOSIS — I639 Cerebral infarction, unspecified: Secondary | ICD-10-CM | POA: Diagnosis not present

## 2017-05-25 DIAGNOSIS — K7460 Unspecified cirrhosis of liver: Secondary | ICD-10-CM | POA: Diagnosis not present

## 2017-05-26 DIAGNOSIS — G8114 Spastic hemiplegia affecting left nondominant side: Secondary | ICD-10-CM | POA: Diagnosis not present

## 2017-05-27 ENCOUNTER — Emergency Department (HOSPITAL_COMMUNITY)
Admission: EM | Admit: 2017-05-27 | Discharge: 2017-05-27 | Disposition: A | Discharge status: Home / Self Care | Payer: Medicare Other | Source: Home / Self Care | Attending: Emergency Medicine | Admitting: Emergency Medicine

## 2017-05-27 DIAGNOSIS — Z9911 Dependence on respirator [ventilator] status: Secondary | ICD-10-CM | POA: Diagnosis not present

## 2017-05-27 DIAGNOSIS — J9620 Acute and chronic respiratory failure, unspecified whether with hypoxia or hypercapnia: Secondary | ICD-10-CM | POA: Diagnosis not present

## 2017-05-27 DIAGNOSIS — T50905A Adverse effect of unspecified drugs, medicaments and biological substances, initial encounter: Secondary | ICD-10-CM | POA: Diagnosis not present

## 2017-05-27 DIAGNOSIS — I517 Cardiomegaly: Secondary | ICD-10-CM | POA: Diagnosis not present

## 2017-05-27 DIAGNOSIS — I69354 Hemiplegia and hemiparesis following cerebral infarction affecting left non-dominant side: Secondary | ICD-10-CM | POA: Diagnosis not present

## 2017-05-27 DIAGNOSIS — Z515 Encounter for palliative care: Secondary | ICD-10-CM | POA: Diagnosis not present

## 2017-05-27 DIAGNOSIS — N179 Acute kidney failure, unspecified: Secondary | ICD-10-CM | POA: Diagnosis not present

## 2017-05-27 DIAGNOSIS — K661 Hemoperitoneum: Secondary | ICD-10-CM | POA: Diagnosis not present

## 2017-05-27 DIAGNOSIS — I5022 Chronic systolic (congestive) heart failure: Secondary | ICD-10-CM

## 2017-05-27 DIAGNOSIS — R8299 Other abnormal findings in urine: Secondary | ICD-10-CM | POA: Insufficient documentation

## 2017-05-27 DIAGNOSIS — F1011 Alcohol abuse, in remission: Secondary | ICD-10-CM | POA: Insufficient documentation

## 2017-05-27 DIAGNOSIS — I4581 Long QT syndrome: Secondary | ICD-10-CM | POA: Diagnosis not present

## 2017-05-27 DIAGNOSIS — R251 Tremor, unspecified: Secondary | ICD-10-CM | POA: Diagnosis not present

## 2017-05-27 DIAGNOSIS — K7200 Acute and subacute hepatic failure without coma: Secondary | ICD-10-CM | POA: Diagnosis not present

## 2017-05-27 DIAGNOSIS — Z87891 Personal history of nicotine dependence: Secondary | ICD-10-CM | POA: Insufficient documentation

## 2017-05-27 DIAGNOSIS — R531 Weakness: Secondary | ICD-10-CM | POA: Diagnosis not present

## 2017-05-27 DIAGNOSIS — Z7189 Other specified counseling: Secondary | ICD-10-CM | POA: Diagnosis not present

## 2017-05-27 DIAGNOSIS — J9600 Acute respiratory failure, unspecified whether with hypoxia or hypercapnia: Secondary | ICD-10-CM | POA: Diagnosis not present

## 2017-05-27 DIAGNOSIS — E872 Acidosis: Secondary | ICD-10-CM | POA: Diagnosis not present

## 2017-05-27 DIAGNOSIS — I480 Paroxysmal atrial fibrillation: Secondary | ICD-10-CM | POA: Diagnosis not present

## 2017-05-27 DIAGNOSIS — G40201 Localization-related (focal) (partial) symptomatic epilepsy and epileptic syndromes with complex partial seizures, not intractable, with status epilepticus: Secondary | ICD-10-CM | POA: Diagnosis not present

## 2017-05-27 DIAGNOSIS — J9602 Acute respiratory failure with hypercapnia: Secondary | ICD-10-CM | POA: Diagnosis not present

## 2017-05-27 DIAGNOSIS — D689 Coagulation defect, unspecified: Secondary | ICD-10-CM | POA: Diagnosis not present

## 2017-05-27 DIAGNOSIS — L03221 Cellulitis of neck: Secondary | ICD-10-CM | POA: Diagnosis not present

## 2017-05-27 DIAGNOSIS — K7210 Chronic hepatic failure without coma: Secondary | ICD-10-CM | POA: Diagnosis not present

## 2017-05-27 DIAGNOSIS — I110 Hypertensive heart disease with heart failure: Secondary | ICD-10-CM | POA: Insufficient documentation

## 2017-05-27 DIAGNOSIS — I4892 Unspecified atrial flutter: Secondary | ICD-10-CM | POA: Diagnosis not present

## 2017-05-27 DIAGNOSIS — E039 Hypothyroidism, unspecified: Secondary | ICD-10-CM | POA: Insufficient documentation

## 2017-05-27 DIAGNOSIS — K7290 Hepatic failure, unspecified without coma: Secondary | ICD-10-CM | POA: Diagnosis not present

## 2017-05-27 DIAGNOSIS — R404 Transient alteration of awareness: Secondary | ICD-10-CM | POA: Diagnosis not present

## 2017-05-27 DIAGNOSIS — E870 Hyperosmolality and hypernatremia: Secondary | ICD-10-CM | POA: Diagnosis not present

## 2017-05-27 DIAGNOSIS — R1312 Dysphagia, oropharyngeal phase: Secondary | ICD-10-CM | POA: Diagnosis not present

## 2017-05-27 DIAGNOSIS — R569 Unspecified convulsions: Secondary | ICD-10-CM | POA: Diagnosis not present

## 2017-05-27 DIAGNOSIS — Z430 Encounter for attention to tracheostomy: Secondary | ICD-10-CM | POA: Diagnosis not present

## 2017-05-27 DIAGNOSIS — Z978 Presence of other specified devices: Secondary | ICD-10-CM | POA: Diagnosis not present

## 2017-05-27 DIAGNOSIS — G40101 Localization-related (focal) (partial) symptomatic epilepsy and epileptic syndromes with simple partial seizures, not intractable, with status epilepticus: Secondary | ICD-10-CM | POA: Diagnosis not present

## 2017-05-27 DIAGNOSIS — I482 Chronic atrial fibrillation: Secondary | ICD-10-CM | POA: Diagnosis not present

## 2017-05-27 DIAGNOSIS — I69391 Dysphagia following cerebral infarction: Secondary | ICD-10-CM | POA: Diagnosis not present

## 2017-05-27 DIAGNOSIS — E43 Unspecified severe protein-calorie malnutrition: Secondary | ICD-10-CM | POA: Diagnosis not present

## 2017-05-27 DIAGNOSIS — J690 Pneumonitis due to inhalation of food and vomit: Secondary | ICD-10-CM | POA: Diagnosis not present

## 2017-05-27 DIAGNOSIS — R262 Difficulty in walking, not elsewhere classified: Secondary | ICD-10-CM | POA: Diagnosis not present

## 2017-05-27 DIAGNOSIS — Z87898 Personal history of other specified conditions: Secondary | ICD-10-CM | POA: Diagnosis not present

## 2017-05-27 DIAGNOSIS — Z8673 Personal history of transient ischemic attack (TIA), and cerebral infarction without residual deficits: Secondary | ICD-10-CM | POA: Diagnosis not present

## 2017-05-27 DIAGNOSIS — K567 Ileus, unspecified: Secondary | ICD-10-CM | POA: Diagnosis not present

## 2017-05-27 DIAGNOSIS — E876 Hypokalemia: Secondary | ICD-10-CM | POA: Diagnosis not present

## 2017-05-27 DIAGNOSIS — G9340 Encephalopathy, unspecified: Secondary | ICD-10-CM | POA: Diagnosis not present

## 2017-05-27 DIAGNOSIS — J988 Other specified respiratory disorders: Secondary | ICD-10-CM | POA: Diagnosis not present

## 2017-05-27 DIAGNOSIS — M6281 Muscle weakness (generalized): Secondary | ICD-10-CM | POA: Diagnosis not present

## 2017-05-27 DIAGNOSIS — I452 Bifascicular block: Secondary | ICD-10-CM | POA: Diagnosis not present

## 2017-05-27 DIAGNOSIS — G40109 Localization-related (focal) (partial) symptomatic epilepsy and epileptic syndromes with simple partial seizures, not intractable, without status epilepticus: Secondary | ICD-10-CM | POA: Diagnosis not present

## 2017-05-27 DIAGNOSIS — R601 Generalized edema: Secondary | ICD-10-CM | POA: Diagnosis not present

## 2017-05-27 DIAGNOSIS — R1310 Dysphagia, unspecified: Secondary | ICD-10-CM | POA: Diagnosis not present

## 2017-05-27 DIAGNOSIS — G40211 Localization-related (focal) (partial) symptomatic epilepsy and epileptic syndromes with complex partial seizures, intractable, with status epilepticus: Secondary | ICD-10-CM | POA: Diagnosis not present

## 2017-05-27 DIAGNOSIS — K7030 Alcoholic cirrhosis of liver without ascites: Secondary | ICD-10-CM | POA: Diagnosis not present

## 2017-05-27 DIAGNOSIS — J9601 Acute respiratory failure with hypoxia: Secondary | ICD-10-CM | POA: Diagnosis not present

## 2017-05-27 LAB — COMPREHENSIVE METABOLIC PANEL
ALT: 13 U/L — ABNORMAL LOW (ref 17–63)
ANION GAP: 7 (ref 5–15)
AST: 38 U/L (ref 15–41)
Albumin: 3.1 g/dL — ABNORMAL LOW (ref 3.5–5.0)
Alkaline Phosphatase: 65 U/L (ref 38–126)
BILIRUBIN TOTAL: 0.8 mg/dL (ref 0.3–1.2)
BUN: 9 mg/dL (ref 6–20)
CO2: 25 mmol/L (ref 22–32)
Calcium: 9 mg/dL (ref 8.9–10.3)
Chloride: 108 mmol/L (ref 101–111)
Creatinine, Ser: 0.9 mg/dL (ref 0.61–1.24)
Glucose, Bld: 97 mg/dL (ref 65–99)
POTASSIUM: 3.8 mmol/L (ref 3.5–5.1)
Sodium: 140 mmol/L (ref 135–145)
TOTAL PROTEIN: 7 g/dL (ref 6.5–8.1)

## 2017-05-27 LAB — CBC WITH DIFFERENTIAL/PLATELET
BASOS ABS: 0 10*3/uL (ref 0.0–0.1)
BASOS PCT: 0 %
Eosinophils Absolute: 0 10*3/uL (ref 0.0–0.7)
Eosinophils Relative: 0 %
HEMATOCRIT: 39 % (ref 39.0–52.0)
Hemoglobin: 13.3 g/dL (ref 13.0–17.0)
Lymphocytes Relative: 26 %
Lymphs Abs: 0.9 10*3/uL (ref 0.7–4.0)
MCH: 30.4 pg (ref 26.0–34.0)
MCHC: 34.1 g/dL (ref 30.0–36.0)
MCV: 89 fL (ref 78.0–100.0)
MONO ABS: 0.4 10*3/uL (ref 0.1–1.0)
Monocytes Relative: 13 %
NEUTROS ABS: 2.1 10*3/uL (ref 1.7–7.7)
NEUTROS PCT: 61 %
Platelets: 163 10*3/uL (ref 150–400)
RBC: 4.38 MIL/uL (ref 4.22–5.81)
RDW: 16.4 % — AB (ref 11.5–15.5)
WBC: 3.4 10*3/uL — AB (ref 4.0–10.5)

## 2017-05-27 LAB — URINALYSIS, ROUTINE W REFLEX MICROSCOPIC
BACTERIA UA: NONE SEEN
BILIRUBIN URINE: NEGATIVE
GLUCOSE, UA: NEGATIVE mg/dL
KETONES UR: NEGATIVE mg/dL
LEUKOCYTES UA: NEGATIVE
NITRITE: NEGATIVE
PH: 6 (ref 5.0–8.0)
Protein, ur: 30 mg/dL — AB
Specific Gravity, Urine: 1.019 (ref 1.005–1.030)
Squamous Epithelial / LPF: NONE SEEN

## 2017-05-27 LAB — RAPID URINE DRUG SCREEN, HOSP PERFORMED
AMPHETAMINES: NOT DETECTED
Barbiturates: NOT DETECTED
Benzodiazepines: NOT DETECTED
COCAINE: NOT DETECTED
Opiates: NOT DETECTED
Tetrahydrocannabinol: NOT DETECTED

## 2017-05-27 LAB — LIPASE, BLOOD: LIPASE: 27 U/L (ref 11–51)

## 2017-05-27 LAB — ETHANOL

## 2017-05-27 MED ORDER — LEVETIRACETAM 250 MG PO TABS: 1250.0000 mg | ORAL_TABLET | Freq: Two times a day (BID) | ORAL | 0 refills | Status: DC

## 2017-05-27 NOTE — ED Notes (Signed)
PTAR HERE FOR PT

## 2017-05-27 NOTE — ED Notes (Signed)
PTAR AWARE OF FAMILY PASSENGER

## 2017-05-27 NOTE — ED Notes (Signed)
Bed: [facility] Expected date:  Expected time:  Means of arrival:  Comments: 

## 2017-05-27 NOTE — ED Notes (Signed)
AWARE OF NEED FOR URINE. URINAL BETWEEN LEGS PER PT REQUEST

## 2017-05-27 NOTE — ED Provider Notes (Signed)
I assumed care of this patient from Dr. Laverta Baltimore at 1700.  Please see their note for further details of Hx, PE.  Briefly patient is a 76 y.o. male with a history of recent stroke currently on Keppra for focal seizure who presents with left hand tremor.   Dr. Laverta Baltimore discussed the case with neurology who wanted to increase the patient's Keppra. Currently we are awaiting UA to rule out infectious process  UA negative.  The patient is safe for discharge with strict return precautions.  Disposition: Discharge  Condition: Good  I have discussed the results, Dx and Tx plan with the patient who expressed understanding and agree(s) with the plan. Discharge instructions discussed at great length. The patient was given strict return precautions who verbalized understanding of the instructions. No further questions at time of discharge.    New Prescriptions   LEVETIRACETAM (KEPPRA) 250 MG TABLET    Take 5 tablets (1,250 mg total) by mouth 2 (two) times daily.    Follow Up: Thressa Sheller, [address], [place] [address] [phone]  Schedule an appointment as soon as possible for a visit  As needed  Neurology  Schedule an appointment as soon as possible for a visit  As needed      Soila Printup, Grayce Sessions, MD 05/28/17 [phone]

## 2017-05-27 NOTE — ED Notes (Signed)
WIFE GIVEN DISCHARGE INSTRUCTIONS. RX WITH DISCHARGE GIVEN TO PTAR

## 2017-05-27 NOTE — ED Triage Notes (Signed)
Pt presents from [facility] after c/o heroin withdrawal. States he has not had any since last Saturday. Only symptom is L arm twitching which he has had before. Alert and oriented.

## 2017-05-27 NOTE — ED Notes (Addendum)
FAXING DISCHARGE SUMMARY TO [facility]. [place]

## 2017-05-27 NOTE — ED Notes (Addendum)
PT WILL HAVE LUE TWITCHING ONLY. FAMILY AT BEDSIDE STATES "THIS IS NORMAL. " HE HAS BEEN DOING THIS FOR YEARS". PT WILL TENSE UP WITH CONVERSATION AND HAVE INCREASED TWITCHING HOWEVER WITH ENCOURAGEMENT WITH CALM. PT UNDERSTANDS HE WAS SLEEPING MOST OF HIS VISIT IN THE DEPARTMENT.

## 2017-05-27 NOTE — ED Provider Notes (Signed)
Emergency Department Provider Note   I have reviewed the triage vital signs and the nursing notes.   HISTORY  Chief Complaint Heroin Withdrawl   HPI Nathan Frank is a 76 y.o. male with PMH of EtOH dependence, Heroine abuse, and prior CVA with residual LUE weakness and recent admission with concern for focal seizure and resulting SNF placement presents to the emergency department for evaluation of possible heroin withdrawal. Patient last used heroin 3-4 days ago according to him. He also is a heavy alcohol drinker with last drink 5-6 days ago. He was sent in for evaluation of left upper extremity jerking movements. States they were worse earlier in the week and worse again last night. No increased weakness on the left side. No vision changes. He's been compliant with medication including Keppra which was started during the previous admission.    Past Medical History:  Diagnosis Date  . Abnormal TSH   . Acute gastric ulcer   . Acute gastritis with hemorrhage   . Alcohol abuse   . Alcohol dependence ([place]) 02/02/2014  . Anemia   . Bifascicular block   . Cirrhosis ([place]) 06/04/2012  . Depressive disorder 02/01/2014  . Dizziness and giddiness 12/13/2014  . DVT of lower limb, acute ([place]) 06/06/2012  . DVT, lower extremity, recurrent ([place])    a. noted 2013. b. also dx 06/2016.  Marland Kitchen Essential hypertension   . Fatty liver   . Gallstones   . Gastritis 06/07/2012  . GERD (gastroesophageal reflux disease)   . GI bleed due to NSAIDs 12/13/2014  . Granulomatous gastritis   . Hematuria 06/04/2012  . Hepatitis C   . Hepatocellular carcinoma ([place])   . Heroin abuse    "I haven't done that since I don't know when."  . Heroin overdose 02/20/2014  . Neuropathy   . NICM (nonischemic cardiomyopathy) ([place])    a. 04/2015: EF 45-50% by cath. b. EF 40-45% by echo 06/2016.  . NSTEMI (non-ST elevated myocardial infarction) ([place])    a. 04/2015 - patent coronaries. Etiology possibly due to coronary spasm versus  embolus, stress cardiomyopathy (atypical), and aborted infarction related to plaque rupture with thrombosis and dissolution. Amlodipine started. Not on antiplatelets due to GIB/cirrhosis history.  . Oral thrush 06/05/2012  . Polysubstance abuse    THC, alcohol, heroin  . Prolonged Q-T interval on ECG    a. 12/2014 - treated with magnesium.  . Right knee pain 12/13/2014  . S/P alcohol detoxification 02/02/2014  . Stroke ([place]) 06/2016  . SVT (supraventricular tachycardia) ([place])    a. 12/2014 in setting of GIB, ETOH, NSAIDS, gastritis.  . Symptomatic cholelithiasis 12/15/2013  . Thrombocytopenia ([place])   . Upper GI bleeding 12/13/2014  . Weight loss 06/04/2012    Patient Active Problem List   Diagnosis Date Noted  . 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 Hepatitis C 05/12/2017  . Atrial fibrillation, chronic ([place]) 05/12/2017  . History of alcohol abuse 05/12/2017  . Hypothyroidism 05/12/2017  . Chronic systolic congestive heart failure, NYHA class 1 ([place]) 05/12/2017  . Heroin abuse 05/12/2017  . Partial seizure disorder ([place]) 05/12/2017  . AKI (acute kidney injury) ([place])   . Demand ischemia ([place])   . NICM (nonischemic cardiomyopathy) ([place])   . Syncope   . Atrial fibrillation with RVR ([place]) 11/12/2016  . ARF (acute renal failure) ([place]) 11/12/2016  . Failure to thrive in adult   . Cellulitis of left knee 08/20/2016  . Back injury 08/20/2016  . Chronic  systolic CHF (congestive heart failure) ([place]) 08/20/2016  . Atrial fibrillation with rapid ventricular response ([place]) 08/06/2016  . Acute blood loss anemia   . Lymphocytosis   . Atrial tachycardia ([place])   . Acute ischemic right MCA stroke ([place]) 07/04/2016  . Obesity 07/01/2016  . Family hx-stroke 07/01/2016  . Dysphagia, post-stroke   . Dysarthria, post-stroke   . Polysubstance abuse   . Chronic combined systolic and diastolic CHF (congestive heart failure) ([place])   . Mitral valve regurgitation   . Anemia of chronic disease    . 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 Acute on chronic diastolic CHF (congestive heart failure) ([place]) 06/09/2016  . History of CVA (cerebrovascular accident) 06/05/2016  . Alcoholic cirrhosis of liver without ascites ([place])   . Other specified hypothyroidism   . Hepatocellular carcinoma ([place])   . Generalized weakness 05/27/2016  . Hypotension 04/18/2016  . Chronic atrial fibrillation ([place]) 04/18/2016  . Prolonged Q-T interval on ECG   . Neuropathy   . Essential hypertension   . Thrombocytopenia ([place]) 04/16/2015  . Abnormal TSH 04/16/2015  . NSTEMI (non-ST elevated myocardial infarction) ([place])   . Acute gastritis with hemorrhage   . GI bleed due to NSAIDs 12/13/2014  . Upper GI bleeding 12/13/2014  . Alcohol dependence ([place]) 02/02/2014  . Depressive disorder 02/01/2014  . Symptomatic cholelithiasis 12/15/2013  . DVT of lower limb, acute ([place]) 06/06/2012  . Alcohol abuse 06/04/2012  . Liver cirrhosis ([place]) 06/04/2012    Past Surgical History:  Procedure Laterality Date  . CARDIAC CATHETERIZATION N/A 04/15/2015   Procedure: Left Heart Cath and Coronary Angiography;  Surgeon: Belva Crome, MD;  Location: [facility];  Service: Cardiovascular;  Laterality: N/A;  . CIRCUMCISION    . ESOPHAGOGASTRODUODENOSCOPY  06/07/2012   Procedure: ESOPHAGOGASTRODUODENOSCOPY (EGD);  Surgeon: Milus Banister, MD;  Location: [facility];  Service: Endoscopy;  Laterality: N/A;  may need treatment of varices  . ESOPHAGOGASTRODUODENOSCOPY N/A 12/14/2014   Procedure: ESOPHAGOGASTRODUODENOSCOPY (EGD);  Surgeon: Jerene Bears, MD;  Location: [facility];  Service: Endoscopy;  Laterality: N/A;  . IR GENERIC HISTORICAL  06/25/2016   IR US GUIDE VASC ACCESS LEFT 06/25/2016 Corrie Mckusick, DO [facility]  . IR GENERIC HISTORICAL  06/25/2016   IR RADIOLOGY PERIPHERAL GUIDED IV START 06/25/2016 Corrie Mckusick, DO [facility]  .  IR GENERIC HISTORICAL  06/25/2016   IR PERCUTANEOUS ART THROMBECTOMY/INFUSION INTRACRANIAL INC DIAG ANGIO 06/25/2016 Luanne Bras, MD [facility]  . RADIOLOGY WITH ANESTHESIA N/A 06/25/2016   Procedure: RADIOLOGY WITH ANESTHESIA;  Surgeon: Luanne Bras, MD;  Location: [facility];  Service: Radiology;  Laterality: N/A;  . TONSILLECTOMY      Current Outpatient Rx  . Order #: [id number] Class: Historical Med  . Order #: [id number] Class: Normal  . Order #: [id number] Class: Historical Med  . Order #: [id number] Class: Normal  . Order #: [id number] Class: Historical Med  . Order #: [id number] Class: Normal  . Order #: [id number] Class: Historical Med  . Order #: [id number] Class: Normal  . Order #: [id number] Class: Historical Med  . Order #: [id number] Class: Historical Med  . Order #: [id number] Class: Print    Allergies Nsaids and Oxycodone  Family History  Problem Relation Age of Onset  . Stroke Father   . Prostate cancer Brother   . Heart disease Mother        Pacemaker  . Diabetes Neg Hx     Social History Social History  Substance Use Topics  . Smoking status: Former Smoker    Packs/day: 0.50    Years: 5.00    Types: Cigarettes  . Smokeless tobacco: Never Used     Comment: "quit smoking cigarettes in the 1970's"  . Alcohol use 4.2 oz/week    7 Glasses of wine per week     Comment: Drinks at least one glass of wine per day-with last drink on 05/11/2017    Review of Systems  Constitutional: No fever/chills Eyes: No visual changes. ENT: No sore throat. Cardiovascular: Denies chest pain. Respiratory: Denies shortness of breath. Gastrointestinal: No abdominal pain.  No nausea, no vomiting.  No diarrhea.  No constipation. Genitourinary: Negative for dysuria. Musculoskeletal: Negative for back pain. Skin: Negative for rash. Neurological: Negative for headaches, focal weakness or numbness. Positive baseline LUE weakness and intermittent tremor.   10-point ROS otherwise  negative.  ____________________________________________   PHYSICAL EXAM:  VITAL SIGNS: ED Triage Vitals  Enc Vitals Group     BP 05/27/17 1220 (!) 157/96     Pulse Rate 05/27/17 1220 92     Resp 05/27/17 1220 20     Temp 05/27/17 1220 98.1 F (36.7 C)     Temp Source 05/27/17 1220 Oral     SpO2 05/27/17 1220 96 %     Pain Score 05/27/17 1202 10   Constitutional: Alert and oriented. Well appearing and in no acute distress. Eyes: Conjunctivae are normal. PERRL.  Head: Atraumatic. Nose: No congestion/rhinnorhea. Mouth/Throat: Mucous membranes are moist.  Neck: No stridor.   Cardiovascular: Normal rate, regular rhythm. Good peripheral circulation. Grossly normal heart sounds.   Respiratory: Normal respiratory effort.  No retractions. Lungs CTAB. Gastrointestinal: Soft and nontender. No distention.  Musculoskeletal: No lower extremity tenderness nor edema. No gross deformities of extremities. Neurologic:  Normal speech and language. 4/5 strength in the LUE. Normal strength and sensation in other areas. Notable LUE tremor seems slightly worse than right. No CN deficits 2-12.  Skin:  Skin is warm, dry and intact. No rash noted.  ____________________________________________   LABS (all labs ordered are listed, but only abnormal results are displayed)  Labs Reviewed  COMPREHENSIVE METABOLIC PANEL - Abnormal; Notable for the following:       Result Value   Albumin 3.1 (*)    ALT 13 (*)    All other components within normal limits  CBC WITH DIFFERENTIAL/PLATELET - Abnormal; Notable for the following:    WBC 3.4 (*)    RDW 16.4 (*)    All other components within normal limits  URINALYSIS, ROUTINE W REFLEX MICROSCOPIC - Abnormal; Notable for the following:    Hgb urine dipstick LARGE (*)    Protein, ur 30 (*)    All other components within normal limits  URINE CULTURE  LIPASE, BLOOD  RAPID URINE DRUG SCREEN, HOSP PERFORMED  ETHANOL    ____________________________________________  EKG   EKG Interpretation  Date/Time:  Thursday May 27 2017 13:22:20 EDT Ventricular Rate:  102 PR Interval:    QRS Duration: 131 QT Interval:  408 QTC Calculation: 521 R Axis:   0 Text Interpretation:  Atrial fibrillation Ventricular premature complex Right bundle branch block No STEMI.  Confirmed by Nanda Quinton [phone]) on 05/27/2017 1:34:48 PM       ____________________________________________  RADIOLOGY  None ____________________________________________   PROCEDURES  Procedure(s) performed:   Procedures  None ____________________________________________   INITIAL IMPRESSION / ASSESSMENT AND PLAN / ED COURSE  Pertinent labs & imaging results that were available  during my care of the patient were reviewed by me and considered in my medical decision making (see chart for details).  Patient presents to the emergency department for evaluation of left upper extremity twitching. He was admitted and discharged to SNF recently from the hospitalist service after going evaluation and treatment for similar movements. These were thought to be simple partial seizure. Her some question whether this is withdrawal related. No seizure activity here in the emergency department. He has baseline left upper extremity weakness.   Plan for repeat labs and discussion with neurology regarding any medication changes or further observation time.   03:03 PM Spoke with Dr. Rory Percy with Neurology. Can increased to 1250 mg Keppra BID.   CIWA less than 8 with last drink being 5-6 days ago. Patient outside the window for severe EtOH withdrawal symptoms and not showing significant active signs or either EtOH or Heroine withdrawal. Focal seizure breakthrough is possible. See Neurology plan as above. UA pending. Anticipate discharge with normal UA.   At this time, I do not feel there is any life-threatening condition present. I have reviewed and  discussed all results (EKG, imaging, lab, urine as appropriate), exam findings with patient. I have reviewed nursing notes and appropriate previous records.  I feel the patient is safe to be discharged home without further emergent workup. Discussed usual and customary return precautions. Patient and family (if present) verbalize understanding and are comfortable with this plan.  Patient will follow-up with their primary care provider. If they do not have a primary care provider, information for follow-up has been provided to them. All questions have been answered.  ____________________________________________  FINAL CLINICAL IMPRESSION(S) / ED DIAGNOSES  Final diagnoses:  Tremor    MEDICATIONS GIVEN DURING THIS VISIT:  Medications - No data to display   NEW OUTPATIENT MEDICATIONS STARTED DURING THIS VISIT:  New Prescriptions   LEVETIRACETAM (KEPPRA) 250 MG TABLET    Take 5 tablets (1,250 mg total) by mouth 2 (two) times daily.      Note:  This document was prepared using Dragon voice recognition software and may include unintentional dictation errors.  Nanda Quinton, MD Emergency Medicine    Xitlaly Ault, [facility], MD 05/27/17 [phone]

## 2017-05-28 ENCOUNTER — Emergency Department (HOSPITAL_COMMUNITY): Payer: Medicare Other

## 2017-05-28 ENCOUNTER — Encounter (HOSPITAL_COMMUNITY): Payer: Self-pay | Admitting: Emergency Medicine

## 2017-05-28 ENCOUNTER — Inpatient Hospital Stay (HOSPITAL_COMMUNITY)
Admission: EM | Admit: 2017-05-28 | Discharge: 2017-06-24 | DRG: 004 | Disposition: A | Discharge status: Other Acute Inpatient Hospital | Payer: Medicare Other | Attending: Internal Medicine | Admitting: Internal Medicine

## 2017-05-28 DIAGNOSIS — F1011 Alcohol abuse, in remission: Secondary | ICD-10-CM

## 2017-05-28 DIAGNOSIS — Z978 Presence of other specified devices: Secondary | ICD-10-CM

## 2017-05-28 DIAGNOSIS — E039 Hypothyroidism, unspecified: Secondary | ICD-10-CM | POA: Diagnosis present

## 2017-05-28 DIAGNOSIS — F329 Major depressive disorder, single episode, unspecified: Secondary | ICD-10-CM | POA: Diagnosis present

## 2017-05-28 DIAGNOSIS — Z87898 Personal history of other specified conditions: Secondary | ICD-10-CM | POA: Diagnosis not present

## 2017-05-28 DIAGNOSIS — J9690 Respiratory failure, unspecified, unspecified whether with hypoxia or hypercapnia: Secondary | ICD-10-CM

## 2017-05-28 DIAGNOSIS — E778 Other disorders of glycoprotein metabolism: Secondary | ICD-10-CM | POA: Diagnosis present

## 2017-05-28 DIAGNOSIS — R112 Nausea with vomiting, unspecified: Secondary | ICD-10-CM

## 2017-05-28 DIAGNOSIS — J690 Pneumonitis due to inhalation of food and vomit: Secondary | ICD-10-CM | POA: Diagnosis present

## 2017-05-28 DIAGNOSIS — E870 Hyperosmolality and hypernatremia: Secondary | ICD-10-CM | POA: Diagnosis present

## 2017-05-28 DIAGNOSIS — T17908A Unspecified foreign body in respiratory tract, part unspecified causing other injury, initial encounter: Secondary | ICD-10-CM

## 2017-05-28 DIAGNOSIS — H53462 Homonymous bilateral field defects, left side: Secondary | ICD-10-CM | POA: Diagnosis present

## 2017-05-28 DIAGNOSIS — Z4659 Encounter for fitting and adjustment of other gastrointestinal appliance and device: Secondary | ICD-10-CM

## 2017-05-28 DIAGNOSIS — I426 Alcoholic cardiomyopathy: Secondary | ICD-10-CM | POA: Diagnosis present

## 2017-05-28 DIAGNOSIS — I252 Old myocardial infarction: Secondary | ICD-10-CM

## 2017-05-28 DIAGNOSIS — R569 Unspecified convulsions: Secondary | ICD-10-CM

## 2017-05-28 DIAGNOSIS — I4581 Long QT syndrome: Secondary | ICD-10-CM | POA: Diagnosis present

## 2017-05-28 DIAGNOSIS — I959 Hypotension, unspecified: Secondary | ICD-10-CM | POA: Diagnosis present

## 2017-05-28 DIAGNOSIS — K219 Gastro-esophageal reflux disease without esophagitis: Secondary | ICD-10-CM | POA: Diagnosis present

## 2017-05-28 DIAGNOSIS — Z87891 Personal history of nicotine dependence: Secondary | ICD-10-CM

## 2017-05-28 DIAGNOSIS — R159 Full incontinence of feces: Secondary | ICD-10-CM | POA: Diagnosis present

## 2017-05-28 DIAGNOSIS — K661 Hemoperitoneum: Secondary | ICD-10-CM | POA: Diagnosis not present

## 2017-05-28 DIAGNOSIS — I5022 Chronic systolic (congestive) heart failure: Secondary | ICD-10-CM | POA: Diagnosis present

## 2017-05-28 DIAGNOSIS — J189 Pneumonia, unspecified organism: Secondary | ICD-10-CM

## 2017-05-28 DIAGNOSIS — K567 Ileus, unspecified: Secondary | ICD-10-CM | POA: Diagnosis not present

## 2017-05-28 DIAGNOSIS — G9340 Encephalopathy, unspecified: Secondary | ICD-10-CM

## 2017-05-28 DIAGNOSIS — Z79899 Other long term (current) drug therapy: Secondary | ICD-10-CM

## 2017-05-28 DIAGNOSIS — J988 Other specified respiratory disorders: Secondary | ICD-10-CM

## 2017-05-28 DIAGNOSIS — G40109 Localization-related (focal) (partial) symptomatic epilepsy and epileptic syndromes with simple partial seizures, not intractable, without status epilepticus: Secondary | ICD-10-CM

## 2017-05-28 DIAGNOSIS — I69391 Dysphagia following cerebral infarction: Secondary | ICD-10-CM

## 2017-05-28 DIAGNOSIS — D689 Coagulation defect, unspecified: Secondary | ICD-10-CM | POA: Diagnosis not present

## 2017-05-28 DIAGNOSIS — E872 Acidosis: Secondary | ICD-10-CM | POA: Diagnosis present

## 2017-05-28 DIAGNOSIS — I69354 Hemiplegia and hemiparesis following cerebral infarction affecting left non-dominant side: Secondary | ICD-10-CM

## 2017-05-28 DIAGNOSIS — Z86718 Personal history of other venous thrombosis and embolism: Secondary | ICD-10-CM

## 2017-05-28 DIAGNOSIS — Z430 Encounter for attention to tracheostomy: Secondary | ICD-10-CM

## 2017-05-28 DIAGNOSIS — Z8673 Personal history of transient ischemic attack (TIA), and cerebral infarction without residual deficits: Secondary | ICD-10-CM | POA: Diagnosis not present

## 2017-05-28 DIAGNOSIS — I110 Hypertensive heart disease with heart failure: Secondary | ICD-10-CM | POA: Diagnosis present

## 2017-05-28 DIAGNOSIS — G40201 Localization-related (focal) (partial) symptomatic epilepsy and epileptic syndromes with complex partial seizures, not intractable, with status epilepticus: Secondary | ICD-10-CM | POA: Diagnosis present

## 2017-05-28 DIAGNOSIS — I482 Chronic atrial fibrillation: Secondary | ICD-10-CM | POA: Diagnosis present

## 2017-05-28 DIAGNOSIS — I071 Rheumatic tricuspid insufficiency: Secondary | ICD-10-CM | POA: Diagnosis present

## 2017-05-28 DIAGNOSIS — F0150 Vascular dementia without behavioral disturbance: Secondary | ICD-10-CM | POA: Diagnosis present

## 2017-05-28 DIAGNOSIS — Z0189 Encounter for other specified special examinations: Secondary | ICD-10-CM

## 2017-05-28 DIAGNOSIS — R34 Anuria and oliguria: Secondary | ICD-10-CM | POA: Diagnosis present

## 2017-05-28 DIAGNOSIS — K7210 Chronic hepatic failure without coma: Secondary | ICD-10-CM | POA: Diagnosis present

## 2017-05-28 DIAGNOSIS — I452 Bifascicular block: Secondary | ICD-10-CM | POA: Diagnosis present

## 2017-05-28 DIAGNOSIS — D6489 Other specified anemias: Secondary | ICD-10-CM | POA: Diagnosis present

## 2017-05-28 DIAGNOSIS — F1020 Alcohol dependence, uncomplicated: Secondary | ICD-10-CM | POA: Diagnosis present

## 2017-05-28 DIAGNOSIS — G40101 Localization-related (focal) (partial) symptomatic epilepsy and epileptic syndromes with simple partial seizures, not intractable, with status epilepticus: Principal | ICD-10-CM | POA: Diagnosis present

## 2017-05-28 DIAGNOSIS — E876 Hypokalemia: Secondary | ICD-10-CM | POA: Diagnosis present

## 2017-05-28 DIAGNOSIS — Z452 Encounter for adjustment and management of vascular access device: Secondary | ICD-10-CM

## 2017-05-28 DIAGNOSIS — D6959 Other secondary thrombocytopenia: Secondary | ICD-10-CM | POA: Diagnosis present

## 2017-05-28 DIAGNOSIS — N179 Acute kidney failure, unspecified: Secondary | ICD-10-CM | POA: Diagnosis not present

## 2017-05-28 DIAGNOSIS — G40211 Localization-related (focal) (partial) symptomatic epilepsy and epileptic syndromes with complex partial seizures, intractable, with status epilepticus: Secondary | ICD-10-CM | POA: Diagnosis not present

## 2017-05-28 DIAGNOSIS — Z431 Encounter for attention to gastrostomy: Secondary | ICD-10-CM

## 2017-05-28 DIAGNOSIS — Z9289 Personal history of other medical treatment: Secondary | ICD-10-CM

## 2017-05-28 DIAGNOSIS — R1310 Dysphagia, unspecified: Secondary | ICD-10-CM | POA: Diagnosis present

## 2017-05-28 DIAGNOSIS — G9389 Other specified disorders of brain: Secondary | ICD-10-CM | POA: Diagnosis present

## 2017-05-28 DIAGNOSIS — I4892 Unspecified atrial flutter: Secondary | ICD-10-CM | POA: Diagnosis not present

## 2017-05-28 DIAGNOSIS — Z9911 Dependence on respirator [ventilator] status: Secondary | ICD-10-CM

## 2017-05-28 DIAGNOSIS — Z6825 Body mass index (BMI) 25.0-25.9, adult: Secondary | ICD-10-CM

## 2017-05-28 DIAGNOSIS — J9620 Acute and chronic respiratory failure, unspecified whether with hypoxia or hypercapnia: Secondary | ICD-10-CM | POA: Diagnosis present

## 2017-05-28 DIAGNOSIS — E43 Unspecified severe protein-calorie malnutrition: Secondary | ICD-10-CM | POA: Diagnosis present

## 2017-05-28 DIAGNOSIS — J90 Pleural effusion, not elsewhere classified: Secondary | ICD-10-CM

## 2017-05-28 DIAGNOSIS — K7031 Alcoholic cirrhosis of liver with ascites: Secondary | ICD-10-CM | POA: Diagnosis present

## 2017-05-28 DIAGNOSIS — Z9119 Patient's noncompliance with other medical treatment and regimen: Secondary | ICD-10-CM

## 2017-05-28 DIAGNOSIS — B1920 Unspecified viral hepatitis C without hepatic coma: Secondary | ICD-10-CM | POA: Diagnosis present

## 2017-05-28 DIAGNOSIS — G629 Polyneuropathy, unspecified: Secondary | ICD-10-CM | POA: Diagnosis present

## 2017-05-28 DIAGNOSIS — L03221 Cellulitis of neck: Secondary | ICD-10-CM | POA: Diagnosis not present

## 2017-05-28 DIAGNOSIS — J9600 Acute respiratory failure, unspecified whether with hypoxia or hypercapnia: Secondary | ICD-10-CM

## 2017-05-28 LAB — CBC WITH DIFFERENTIAL/PLATELET
BASOS ABS: 0 10*3/uL (ref 0.0–0.1)
Basophils Relative: 0 %
EOS PCT: 0 %
Eosinophils Absolute: 0 10*3/uL (ref 0.0–0.7)
HEMATOCRIT: 35.1 % — AB (ref 39.0–52.0)
Hemoglobin: 12.1 g/dL — ABNORMAL LOW (ref 13.0–17.0)
LYMPHS ABS: 1.3 10*3/uL (ref 0.7–4.0)
LYMPHS PCT: 29 %
MCH: 30.5 pg (ref 26.0–34.0)
MCHC: 34.5 g/dL (ref 30.0–36.0)
MCV: 88.4 fL (ref 78.0–100.0)
Monocytes Absolute: 0.6 10*3/uL (ref 0.1–1.0)
Monocytes Relative: 12 %
NEUTROS ABS: 2.6 10*3/uL (ref 1.7–7.7)
Neutrophils Relative %: 59 %
Platelets: 146 10*3/uL — ABNORMAL LOW (ref 150–400)
RBC: 3.97 MIL/uL — AB (ref 4.22–5.81)
RDW: 16 % — ABNORMAL HIGH (ref 11.5–15.5)
WBC: 4.5 10*3/uL (ref 4.0–10.5)

## 2017-05-28 LAB — COMPREHENSIVE METABOLIC PANEL
ALBUMIN: 2.9 g/dL — AB (ref 3.5–5.0)
ALK PHOS: 56 U/L (ref 38–126)
ALT: 12 U/L — AB (ref 17–63)
AST: 37 U/L (ref 15–41)
Anion gap: 9 (ref 5–15)
BILIRUBIN TOTAL: 1.3 mg/dL — AB (ref 0.3–1.2)
BUN: 10 mg/dL (ref 6–20)
CALCIUM: 8.9 mg/dL (ref 8.9–10.3)
CO2: 25 mmol/L (ref 22–32)
CREATININE: 1.06 mg/dL (ref 0.61–1.24)
Chloride: 106 mmol/L (ref 101–111)
GFR calc Af Amer: >60 mL/min (ref >60)
GLUCOSE: 91 mg/dL (ref 65–99)
POTASSIUM: 3.1 mmol/L — AB (ref 3.5–5.1)
Sodium: 140 mmol/L (ref 135–145)
TOTAL PROTEIN: 6.8 g/dL (ref 6.5–8.1)

## 2017-05-28 LAB — PHOSPHORUS: Phosphorus: 2.6 mg/dL (ref 2.5–4.6)

## 2017-05-28 LAB — MAGNESIUM: MAGNESIUM: 1.5 mg/dL — AB (ref 1.7–2.4)

## 2017-05-28 MED ORDER — LEVETIRACETAM 500 MG/5ML IV SOLN
500.0000 mg | Freq: Once | INTRAVENOUS | Status: AC
  Administered 2017-05-28: 500 mg via INTRAVENOUS
  Filled 2017-05-28: qty 5

## 2017-05-28 NOTE — ED Notes (Signed)
Placed pt on bedpan, two assist tolerated well. Pt had small liquid bm.

## 2017-05-28 NOTE — ED Notes (Signed)
Patient's son, Hilliard Clark, may be reached at [phone] if any questions/concerns (healthcare proxy last time patient was in hospital).

## 2017-05-28 NOTE — Consult Note (Signed)
NEURO HOSPITALIST CONSULT NOTE   Requestig physician: Dr. Erlinda Hong  Reason for Consult: New onset of continuous left upper extremity jerking  History obtained from:  Patient and Chart    HPI:                                                                                                                                          Nathan Frank is an 76 y.o. male who presents to the ED with a history of recent stroke with residual left hemiparesis and focal seizure disorder who presents with a 5 day history of LUE jerking and increased LUE and LLE weakness. He states that he first noticed a tremor in his left arm and foot on Monday. He is ambulatory at baseline but since Monday he has been unable to walk. He was diagnosed with a TIA 2 weeks ago, concomitant with heroin withdrawal and new onset seizure at that time. On evaluation by ED staff he was alert and fully oriented with tremors to LUE as well as difficulty extending left arm and fingers. He has been a resident of a SNF since after his discharge on 8/13. Prior to his last admission on 8/8, his son reports that he had binged several times on EtOH and heroin.   New onset seizures were diagnosed at the last admission when he presented with left face, arm and leg twitching. Seizures broke after fosphenytoin load of 1200 mg was added following initial IV load of 1000 mg Keppra. He was diagnosed with new onset simple partial seizures. Prior infarcted tissues were felt to serve as a seizure focus.   He has been on Keppra 1000 mg BID at the SNF.   PMHx includes hepatitis C, alcohol abuse, history of CVA with residual left hemiparesis, nonischemic cardiomyopathy with an EF of 40-45%, hypothyroidism, atrial fibrillation on Eliquis, and history of noncompliance with medical therapies.  Past Medical History:  Diagnosis Date  . Abnormal TSH   . Acute gastric ulcer   . Acute gastritis with hemorrhage   . Alcohol abuse   . Alcohol  dependence ([place]) 02/02/2014  . Anemia   . Bifascicular block   . Cirrhosis ([place]) 06/04/2012  . Depressive disorder 02/01/2014  . Dizziness and giddiness 12/13/2014  . DVT of lower limb, acute ([place]) 06/06/2012  . DVT, lower extremity, recurrent ([place])    a. noted 2013. b. also dx 06/2016.  Marland Kitchen Essential hypertension   . Fatty liver   . Gallstones   . Gastritis 06/07/2012  . GERD (gastroesophageal reflux disease)   . GI bleed due to NSAIDs 12/13/2014  . Granulomatous gastritis   . Hematuria 06/04/2012  . Hepatitis C   . Hepatocellular carcinoma ([place])   . Heroin abuse    "I haven't done that  since I don't know when."  . Heroin overdose 02/20/2014  . Neuropathy   . NICM (nonischemic cardiomyopathy) ([place])    a. 04/2015: EF 45-50% by cath. b. EF 40-45% by echo 06/2016.  . NSTEMI (non-ST elevated myocardial infarction) ([place])    a. 04/2015 - patent coronaries. Etiology possibly due to coronary spasm versus embolus, stress cardiomyopathy (atypical), and aborted infarction related to plaque rupture with thrombosis and dissolution. Amlodipine started. Not on antiplatelets due to GIB/cirrhosis history.  . Oral thrush 06/05/2012  . Polysubstance abuse    THC, alcohol, heroin  . Prolonged Q-T interval on ECG    a. 12/2014 - treated with magnesium.  . Right knee pain 12/13/2014  . S/P alcohol detoxification 02/02/2014  . Stroke ([place]) 06/2016  . SVT (supraventricular tachycardia) ([place])    a. 12/2014 in setting of GIB, ETOH, NSAIDS, gastritis.  . Symptomatic cholelithiasis 12/15/2013  . Thrombocytopenia ([place])   . Upper GI bleeding 12/13/2014  . Weight loss 06/04/2012    Past Surgical History:  Procedure Laterality Date  . CARDIAC CATHETERIZATION N/A 04/15/2015   Procedure: Left Heart Cath and Coronary Angiography;  Surgeon: Belva Crome, MD;  Location: [facility];  Service: Cardiovascular;  Laterality: N/A;  . CIRCUMCISION    . ESOPHAGOGASTRODUODENOSCOPY  06/07/2012   Procedure: ESOPHAGOGASTRODUODENOSCOPY  (EGD);  Surgeon: Milus Banister, MD;  Location: [facility];  Service: Endoscopy;  Laterality: N/A;  may need treatment of varices  . ESOPHAGOGASTRODUODENOSCOPY N/A 12/14/2014   Procedure: ESOPHAGOGASTRODUODENOSCOPY (EGD);  Surgeon: Jerene Bears, MD;  Location: [facility] ENDOSCOPY;  Service: Endoscopy;  Laterality: N/A;  . IR GENERIC HISTORICAL  06/25/2016   IR US GUIDE VASC ACCESS LEFT 06/25/2016 Corrie Mckusick, DO [facility]  . IR GENERIC HISTORICAL  06/25/2016   IR RADIOLOGY PERIPHERAL GUIDED IV START 06/25/2016 Corrie Mckusick, DO [facility]  . IR GENERIC HISTORICAL  06/25/2016   IR PERCUTANEOUS ART THROMBECTOMY/INFUSION INTRACRANIAL INC DIAG ANGIO 06/25/2016 Luanne Bras, MD [facility]  . RADIOLOGY WITH ANESTHESIA N/A 06/25/2016   Procedure: RADIOLOGY WITH ANESTHESIA;  Surgeon: Luanne Bras, MD;  Location: [facility];  Service: Radiology;  Laterality: N/A;  . TONSILLECTOMY      Family History  Problem Relation Age of Onset  . Stroke Father   . Prostate cancer Brother   . Heart disease Mother        Pacemaker  . Diabetes Neg Hx    Social History:  reports that he has quit smoking. His smoking use included Cigarettes. He has a 2.50 pack-year smoking history. He has never used smokeless tobacco. He reports that he does not drink alcohol or use drugs.  Allergies  Allergen Reactions  . Nsaids Other (See Comments)    Acute gastric ulcers  . Oxycodone Other (See Comments)    Per the risk calculating tool RIOSORD: (Risk Index for Overdose or Serious Opioid -induced Respiratory Depression Risk ) calculated risk in ensuing 6 months  = 83% ( see Problem List for discussion)    SNF MEDICATIONS:  Pradaxa 546 mg BID Folic acid Vitamin D3 Lactulose Keppra 1000 mg BID Synthroid Magnesium oxide Melatonin Metoprolol Thiamine  ROS:                                                                                                                                        No fever, chills, SOB, CP, abdominal pain, N/V or headaches.   Blood pressure (!) 149/104, pulse 89, resp. rate (!) 21, SpO2 99 %.  General Examination:                                                                                                      HEENT-  /AT    Lungs- Respirations unlabored Extremities- Warm and well-perfused  Neurological Examination Mental Status: Drowsy. When fully awakened, he is oriented x 4. Speech is fluent with intact comprehension of basic commands, but difficulty with 2-step directional commands and has right-left confusion. Able to repeat phrases and name common objects.  Cranial Nerves: II: Left homonymous hemianopsia. PERRL III,IV, VI: EOMI without nystagmus. Has some hesitation with leftward gaze.  V,VII: No facial droop. Decreased temp sensation on the left.  VIII: hearing intact to voice IX,X: No hypophonia XI: Decreased on the left XII: midline tongue extension Motor: RUE 5/5 RLE 5/5 LUE rigid tone with 2/5 deltoid, 3/5 biceps/triceps and 4-/5 grip strength. Continuous left upper extremity jerking is noted at approximately 1 per second with irregular rhythm. The jerking involves the shoulder, forearm and hand LLE: Mild rigidity with 2/5 hip flexion, 4-/5 knee extension and 3/5 ADF/APF Sensory: Decreased FT and temperature sensation to LUE and LLE Deep Tendon Reflexes: Hypoactive on the left Plantars: Right: downgoing  Left: upgoing Cerebellar: No ataxia with FNF on right. Unable to perform on left.  Gait: Unable to assess  Lab Results: Basic Metabolic Panel:  Recent Labs Lab 05/27/17 1409 05/28/17 2052  NA 140 140  K 3.8 3.1*  CL 108 106  CO2 25 25  GLUCOSE 97 91  BUN 9 10  CREATININE 0.90 1.06  CALCIUM 9.0 8.9  MG  --  1.5*  PHOS  --  2.6    Liver Function Tests:  Recent Labs Lab 05/27/17 1409 05/28/17 2052  AST 38 37  ALT 13* 12*   ALKPHOS 65 56  BILITOT 0.8 1.3*  PROT 7.0 6.8  ALBUMIN 3.1* 2.9*    Recent Labs Lab 05/27/17 1409  LIPASE 27   No results for input(s): AMMONIA in the last 168 hours.  CBC:  Recent Labs Lab 05/27/17 1409 05/28/17 2052  WBC 3.4* 4.5  NEUTROABS 2.1 2.6  HGB 13.3 12.1*  HCT 39.0 35.1*  MCV 89.0 88.4  PLT 163 146*    Cardiac Enzymes: No results for input(s): CKTOTAL, CKMB, CKMBINDEX, TROPONINI in the last 168 hours.  Lipid Panel: No results for input(s): CHOL, TRIG, HDL, CHOLHDL, VLDL, LDLCALC in the last 168 hours.  CBG: No results for input(s): GLUCAP in the last 168 hours.  Microbiology: Results for orders placed or performed during the hospital encounter of 11/12/16  MRSA PCR Screening     Status: None   Collection Time: 11/13/16  4:17 AM  Result Value Ref Range Status   MRSA by PCR NEGATIVE NEGATIVE Final    Comment:        The GeneXpert MRSA Assay (FDA approved for NASAL specimens only), is one component of a comprehensive MRSA colonization surveillance program. It is not intended to diagnose MRSA infection nor to guide or monitor treatment for MRSA infections.     Coagulation Studies: No results for input(s): LABPROT, INR in the last 72 hours.  Imaging: Ct Head Wo Contrast  Result Date: 05/28/2017 CLINICAL DATA:  Extremity weakness. EXAM: CT HEAD WITHOUT CONTRAST TECHNIQUE: Contiguous axial images were obtained from the base of the skull through the vertex without intravenous contrast. COMPARISON:  CT scan of May 12, 2017. FINDINGS: Brain: Encephalomalacia of right MCA territory is noted consistent with old infarction. Old left posterior parietal infarction is noted as well. No mass effect or midline shift is noted. Ventricular size is within normal limits. There is no evidence of mass lesion, hemorrhage or acute infarction. Vascular: No hyperdense vessel or unexpected calcification. Skull: Normal. Negative for fracture or focal lesion.  Sinuses/Orbits: No acute finding. Other: None. IMPRESSION: Old large right MCA infarction as described above. No acute intracranial abnormality seen. Electronically Signed   By: Marijo Conception, M.D.   On: 05/28/2017 20:38   Dg Chest Portable 1 View  Result Date: 05/28/2017 CLINICAL DATA:  Weakness and new onset seizures. EXAM: PORTABLE CHEST 1 VIEW COMPARISON:  11/12/2016 FINDINGS: 1946 hours. The cardio pericardial silhouette is enlarged. There is pulmonary vascular congestion without overt pulmonary edema. Probable minimal basilar atelectasis. No edema, focal airspace consolidation, or substantial pleural effusion. Bones are diffusely demineralized. Telemetry leads overlie the chest. IMPRESSION: Cardiomegaly with vascular congestion. Electronically Signed   By: Misty Stanley M.D.   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 Neurological examination shows left hemiparesis and continuous jerking of the LUE. Also noted is left homonymous hemianopsia 2. CT head shows old large right MCA infarction. 3. MRI brain reveals widespread gyriform restricted diffusion in the right hemisphere including the right hippocampus. This is most compatible with sequelae of seizure activity. Also noted is underlying chronic right greater than left hemispheric encephalomalacia.  Recommendations: 1. EEG 2. Continue Keppra at 1000 mg BID. Administer IV 3. Load with valproic acid 20 mg/kg IV x 1. Continue at scheduled dose of 5 mg/kg IV TID 4. If seizures do not resolve after valproic acid, call Neurology for further recommendations 5. Seizure precautions 6. Continue pradaxa 7. Continue thiamine 8. Continue synthroid  Electronically signed: Dr. Kerney Elbe 05/28/2017, 11:59 PM

## 2017-05-28 NOTE — ED Provider Notes (Signed)
[facility] DEPT Provider Note   CSN: [id number] Arrival date & time: 05/28/17  1820     History   Chief Complaint Chief Complaint  Patient presents with  . Weakness  . Tremors    HPI Nathan Frank is a 76 y.o. male.  HPI Patient with history of CVA with residual left-sided hemiparesis and recent admission for seizure-like activity presents with weakness and concerns for additional seizures. Son is at bedside, and provides history. He reports patient has been on several benders, using alcohol and heroin leading up to his admission on 8/8 for partial complex seizures. He was subsequently discharged to SNF. Of note, he has had no access to alcohol or drugs since his admission 8/8. Patient has some confusion at baseline, according to son. Son reports he noted increased twitching of his left upper extremity and eye blinking for the past 2 days. He also has been more confused, unable to walk (was previously walking at SNF), and less responsive. He was taken to [facility], but son was unable to accompany them. Keppra was increased and patient was discharged back to facility.   Past Medical History:  Diagnosis Date  . Abnormal TSH   . Acute gastric ulcer   . Acute gastritis with hemorrhage   . Alcohol abuse   . Alcohol dependence ([place]) 02/02/2014  . Anemia   . Bifascicular block   . Cirrhosis ([place]) 06/04/2012  . Depressive disorder 02/01/2014  . Dizziness and giddiness 12/13/2014  . DVT of lower limb, acute ([place]) 06/06/2012  . DVT, lower extremity, recurrent ([place])    a. noted 2013. b. also dx 06/2016.  Marland Kitchen Essential hypertension   . Fatty liver   . Gallstones   . Gastritis 06/07/2012  . GERD (gastroesophageal reflux disease)   . GI bleed due to NSAIDs 12/13/2014  . Granulomatous gastritis   . Hematuria 06/04/2012  . Hepatitis C   . Hepatocellular carcinoma ([place])   . Heroin abuse    "I haven't done that since I don't know when."  . Heroin overdose 02/20/2014  . Neuropathy     . NICM (nonischemic cardiomyopathy) ([place])    a. 04/2015: EF 45-50% by cath. b. EF 40-45% by echo 06/2016.  . NSTEMI (non-ST elevated myocardial infarction) ([place])    a. 04/2015 - patent coronaries. Etiology possibly due to coronary spasm versus embolus, stress cardiomyopathy (atypical), and aborted infarction related to plaque rupture with thrombosis and dissolution. Amlodipine started. Not on antiplatelets due to GIB/cirrhosis history.  . Oral thrush 06/05/2012  . Polysubstance abuse    THC, alcohol, heroin  . Prolonged Q-T interval on ECG    a. 12/2014 - treated with magnesium.  . Right knee pain 12/13/2014  . S/P alcohol detoxification 02/02/2014  . Stroke ([place]) 06/2016  . SVT (supraventricular tachycardia) ([place])    a. 12/2014 in setting of GIB, ETOH, NSAIDS, gastritis.  . Symptomatic cholelithiasis 12/15/2013  . Thrombocytopenia ([place])   . Upper GI bleeding 12/13/2014  . Weight loss 06/04/2012    Patient Active Problem List   Diagnosis Date Noted  . Compromised airway   . Aspiration into airway   . Seizures ([place])   . Acute encephalopathy   . 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 Hepatitis C 05/12/2017  . Atrial fibrillation, chronic ([place]) 05/12/2017  . History of alcohol abuse 05/12/2017  . Hypothyroidism 05/12/2017  . Chronic systolic congestive heart failure, NYHA class 1 ([place]) 05/12/2017  .  Heroin abuse 05/12/2017  . Partial seizure disorder ([place]) 05/12/2017  . AKI (acute kidney injury) ([place])   . Demand ischemia ([place])   . NICM (nonischemic cardiomyopathy) ([place])   . Syncope   . Atrial fibrillation with RVR ([place]) 11/12/2016  . ARF (acute renal failure) ([place]) 11/12/2016  . Failure to thrive in adult   . Cellulitis of left knee 08/20/2016  . Back injury 08/20/2016  . Chronic systolic CHF (congestive heart failure) ([place]) 08/20/2016  . Atrial fibrillation with rapid ventricular response ([place]) 08/06/2016  . Acute  blood loss anemia   . Lymphocytosis   . Atrial tachycardia ([place])   . Acute ischemic right MCA stroke ([place]) 07/04/2016  . Obesity 07/01/2016  . Family hx-stroke 07/01/2016  . Dysphagia, post-stroke   . Dysarthria, post-stroke   . Polysubstance abuse   . Chronic combined systolic and diastolic CHF (congestive heart failure) ([place])   . Mitral valve regurgitation   . Anemia of chronic disease   . Cerebral infarction due to embolism of right middle cerebral artery ([place]) - s/p mechanical thrombectomy 06/25/2016  . Acute respiratory failure with hypoxia ([place])   . Acute on chronic diastolic CHF (congestive heart failure) ([place]) 06/09/2016  . History of CVA (cerebrovascular accident) 06/05/2016  . 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 Other specified hypothyroidism   . Hepatocellular carcinoma ([place])   . Generalized weakness 05/27/2016  . Hypotension 04/18/2016  . Chronic atrial fibrillation ([place]) 04/18/2016  . Prolonged Q-T interval on ECG   . Neuropathy   . Essential hypertension   . Thrombocytopenia ([place]) 04/16/2015  . Abnormal TSH 04/16/2015  . NSTEMI (non-ST elevated myocardial infarction) ([place])   . Acute gastritis with hemorrhage   . GI bleed due to NSAIDs 12/13/2014  . Upper GI bleeding 12/13/2014  . Alcohol dependence ([place]) 02/02/2014  . Depressive disorder 02/01/2014  . Symptomatic cholelithiasis 12/15/2013  . DVT of lower limb, acute ([place]) 06/06/2012  . Alcohol abuse 06/04/2012  . Liver cirrhosis ([place]) 06/04/2012    Past Surgical History:  Procedure Laterality Date  . CARDIAC CATHETERIZATION N/A 04/15/2015   Procedure: Left Heart Cath and Coronary Angiography;  Surgeon: Belva Crome, MD;  Location: [facility];  Service: Cardiovascular;  Laterality: N/A;  . CIRCUMCISION    . ESOPHAGOGASTRODUODENOSCOPY  06/07/2012   Procedure: ESOPHAGOGASTRODUODENOSCOPY (EGD);  Surgeon: Milus Banister, MD;  Location: [facility];  Service: Endoscopy;  Laterality: N/A;   may need treatment of varices  . ESOPHAGOGASTRODUODENOSCOPY N/A 12/14/2014   Procedure: ESOPHAGOGASTRODUODENOSCOPY (EGD);  Surgeon: Jerene Bears, MD;  Location: [facility] ENDOSCOPY;  Service: Endoscopy;  Laterality: N/A;  . IR GENERIC HISTORICAL  06/25/2016   IR US GUIDE VASC ACCESS LEFT 06/25/2016 Corrie Mckusick, DO [facility]  . IR GENERIC HISTORICAL  06/25/2016   IR RADIOLOGY PERIPHERAL GUIDED IV START 06/25/2016 Corrie Mckusick, DO [facility]  . IR GENERIC HISTORICAL  06/25/2016   IR PERCUTANEOUS ART THROMBECTOMY/INFUSION INTRACRANIAL INC DIAG ANGIO 06/25/2016 Luanne Bras, MD [facility]  . RADIOLOGY WITH ANESTHESIA N/A 06/25/2016   Procedure: RADIOLOGY WITH ANESTHESIA;  Surgeon: Luanne Bras, MD;  Location: [facility];  Service: Radiology;  Laterality: N/A;  . TONSILLECTOMY         Home Medications    Prior to Admission medications   Medication Sig Start Date End Date Taking? Authorizing Provider  Cholecalciferol (VITAMIN D-3) 5000 units TABS Take 5,000 Units by mouth daily.    Yes [provider]  dabigatran ([place])  150 MG CAPS capsule Take 1 capsule (150 mg total) by mouth every 12 (twelve) hours. 05/17/17  Yes Florencia Reasons, MD  folic acid (FOLVITE) 1 MG tablet Take 1 mg by mouth daily. 12/03/16  Yes [provider]  lactulose (CHRONULAC) 10 GM/15ML solution Take 15 mLs (10 g total) by mouth 2 (two) times daily. 05/17/17  Yes Florencia Reasons, MD  levETIRAcetam (KEPPRA) 1000 MG tablet Take 1,000 mg by mouth 2 (two) times daily. 05/17/17  Yes [provider]  levothyroxine (SYNTHROID, LEVOTHROID) 50 MCG tablet Take 50 mcg by mouth daily before breakfast.  12/29/16  Yes [provider]  magnesium oxide (MAG-OX) 400 (241.3 Mg) MG tablet Take 1 tablet (400 mg total) by mouth daily. 01/25/17  Yes Fay Records, MD  Melatonin 5 MG CAPS Take 5 mg by mouth at bedtime.    Yes [provider]  metoprolol tartrate (LOPRESSOR) 25 MG tablet Take 0.5 tablets (12.5 mg  total) by mouth 2 (two) times daily. 11/15/16 05/28/17 Yes Reyne Dumas, MD  thiamine 100 MG tablet Take 100 mg by mouth daily.   Yes [provider]    Family History Family History  Problem Relation Age of Onset  . Stroke Father   . Prostate cancer Brother   . Heart disease Mother        Pacemaker  . Diabetes Neg Hx     Social History Social History  Substance Use Topics  . Smoking status: Former Smoker    Packs/day: 0.50    Years: 5.00    Types: Cigarettes  . Smokeless tobacco: Never Used     Comment: "quit smoking cigarettes in the 1970's"  . Alcohol use No     Comment: Drinks at least one glass of wine per day-with last drink on 05/11/2017     Allergies   Nsaids and Oxycodone   Review of Systems Review of Systems  Constitutional: Negative for chills and fever.  HENT: Negative for ear pain and sore throat.   Eyes: Negative for pain and visual disturbance.  Respiratory: Negative for cough and shortness of breath.   Cardiovascular: Negative for chest pain and palpitations.  Gastrointestinal: Negative for abdominal pain and vomiting.  Genitourinary: Negative for dysuria and hematuria.  Musculoskeletal: Negative for arthralgias and back pain.  Skin: Negative for color change and rash.  Neurological: Positive for seizures and weakness. Negative for syncope.  All other systems reviewed and are negative.    Physical Exam Updated Vital Signs BP 103/78   Pulse 96   Temp 98.7 F (37.1 C) (Axillary) Comment: bair hugger turned off   Resp 13   Ht _0  (1.803 m)   Wt 84.6 kg (186 lb 9.6 oz)   SpO2 100%   BMI 26.03 kg/m   Physical Exam  Constitutional: He appears well-developed and well-nourished.  HENT:  Head: Normocephalic and atraumatic.  Eyes: Conjunctivae are normal.  Neck: Neck supple.  Cardiovascular: Normal rate and regular rhythm.   No murmur heard. Pulmonary/Chest: Effort normal and breath sounds normal. No respiratory distress.    Abdominal: Soft. There is no tenderness.  Musculoskeletal: He exhibits no edema.  Neurological: He is alert. He displays atrophy. A sensory deficit is present.  Oriented to person only Constant jerking of RUE, rhythmic blinking of both eyes  Skin: Skin is warm and dry.  Psychiatric: He has a normal mood and affect.  Nursing note and vitals reviewed.    ED Treatments / Results  Labs (all labs  ordered are listed, but only abnormal results are displayed) Labs Reviewed  CBC WITH DIFFERENTIAL/PLATELET - Abnormal; Notable for the following:       Result Value   RBC 3.97 (*)    Hemoglobin 12.1 (*)    HCT 35.1 (*)    RDW 16.0 (*)    Platelets 146 (*)    All other components within normal limits  URINALYSIS, ROUTINE W REFLEX MICROSCOPIC - Abnormal; Notable for the following:    Ketones, ur 5 (*)    All other components within normal limits  COMPREHENSIVE METABOLIC PANEL - Abnormal; Notable for the following:    Potassium 3.1 (*)    Albumin 2.9 (*)    ALT 12 (*)    Total Bilirubin 1.3 (*)    All other components within normal limits  MAGNESIUM - Abnormal; Notable for the following:    Magnesium 1.5 (*)    All other components within normal limits  CBC - Abnormal; Notable for the following:    RBC 4.15 (*)    Hemoglobin 12.8 (*)    HCT 36.3 (*)    RDW 15.9 (*)    Platelets 122 (*)    All other components within normal limits  COMPREHENSIVE METABOLIC PANEL - Abnormal; Notable for the following:    Potassium 3.0 (*)    Calcium 8.8 (*)    Albumin 3.0 (*)    ALT 13 (*)    Total Bilirubin 1.5 (*)    All other components within normal limits  CBC - Abnormal; Notable for the following:    WBC 2.5 (*)    RBC 3.64 (*)    Hemoglobin 11.2 (*)    HCT 31.9 (*)    RDW 15.8 (*)    Platelets 122 (*)    All other components within normal limits  COMPREHENSIVE METABOLIC PANEL - Abnormal; Notable for the following:    Potassium 2.4 (*)    BUN 5 (*)    Calcium 8.4 (*)    Total Protein  6.1 (*)    Albumin 2.4 (*)    ALT 10 (*)    Total Bilirubin 1.3 (*)    All other components within normal limits  AMMONIA - Abnormal; Notable for the following:    Ammonia 56 (*)    All other components within normal limits  HEPATIC FUNCTION PANEL - Abnormal; Notable for the following:    Total Protein 6.2 (*)    Albumin 2.6 (*)    ALT 11 (*)    Total Bilirubin 1.3 (*)    Indirect Bilirubin 1.0 (*)    All other components within normal limits  CBC - Abnormal; Notable for the following:    WBC 2.4 (*)    RBC 3.93 (*)    Hemoglobin 12.4 (*)    HCT 34.7 (*)    RDW 15.9 (*)    Platelets 112 (*)    All other components within normal limits  BASIC METABOLIC PANEL - Abnormal; Notable for the following:    BUN <5 (*)    Calcium 8.3 (*)    All other components within normal limits  AMMONIA - Abnormal; Notable for the following:    Ammonia 56 (*)    All other components within normal limits  HEPATIC FUNCTION PANEL - Abnormal; Notable for the following:    Total Protein 5.7 (*)    Albumin 2.4 (*)    ALT 10 (*)    All other components within normal limits  AMMONIA - Abnormal;  Notable for the following:    Ammonia 97 (*)    All other components within normal limits  BASIC METABOLIC PANEL - Abnormal; Notable for the following:    Potassium 3.2 (*)    Glucose, Bld 100 (*)    BUN 5 (*)    Calcium 8.3 (*)    All other components within normal limits  MAGNESIUM - Abnormal; Notable for the following:    Magnesium 1.6 (*)    All other components within normal limits  AMMONIA - Abnormal; Notable for the following:    Ammonia 86 (*)    All other components within normal limits  CBC - Abnormal; Notable for the following:    HCT 38.9 (*)    RDW 15.9 (*)    Platelets 121 (*)    All other components within normal limits  BLOOD GAS, ARTERIAL - Abnormal; Notable for the following:    pH, Arterial 7.492 (*)    pCO2 arterial 31.7 (*)    pO2, Arterial 71.6 (*)    All other components  within normal limits  AMMONIA - Abnormal; Notable for the following:    Ammonia 42 (*)    All other components within normal limits  COMPREHENSIVE METABOLIC PANEL - Abnormal; Notable for the following:    Potassium 2.4 (*)    BUN 5 (*)    Calcium 8.4 (*)    Total Protein 5.9 (*)    Albumin 2.2 (*)    ALT 8 (*)    All other components within normal limits  CBC - Abnormal; Notable for the following:    WBC 3.4 (*)    RBC 4.09 (*)    Hemoglobin 12.7 (*)    HCT 35.5 (*)    RDW 16.4 (*)    Platelets 134 (*)    All other components within normal limits  MAGNESIUM - Abnormal; Notable for the following:    Magnesium 1.4 (*)    All other components within normal limits  BASIC METABOLIC PANEL - Abnormal; Notable for the following:    Potassium 3.2 (*)    Chloride 115 (*)    CO2 20 (*)    Glucose, Bld 107 (*)    BUN <5 (*)    Calcium 8.0 (*)    All other components within normal limits  GLUCOSE, CAPILLARY - Abnormal; Notable for the following:    Glucose-Capillary 145 (*)    All other components within normal limits  GLUCOSE, CAPILLARY - Abnormal; Notable for the following:    Glucose-Capillary 121 (*)    All other components within normal limits  BASIC METABOLIC PANEL - Abnormal; Notable for the following:    Potassium 3.2 (*)    Chloride 115 (*)    CO2 19 (*)    Glucose, Bld 116 (*)    Calcium 8.2 (*)    All other components within normal limits  CBC - Abnormal; Notable for the following:    RBC 4.18 (*)    Hemoglobin 12.6 (*)    HCT 37.4 (*)    RDW 17.1 (*)    Platelets 116 (*)    All other components within normal limits  GLUCOSE, CAPILLARY - Abnormal; Notable for the following:    Glucose-Capillary 106 (*)    All other components within normal limits  GLUCOSE, CAPILLARY - Abnormal; Notable for the following:    Glucose-Capillary 104 (*)    All other components within normal limits  GLUCOSE, CAPILLARY - Abnormal; Notable for the following:  Glucose-Capillary 109  (*)    All other components within normal limits  GLUCOSE, CAPILLARY - Abnormal; Notable for the following:    Glucose-Capillary 108 (*)    All other components within normal limits  POCT I-STAT 3, ART BLOOD GAS (G3+) - Abnormal; Notable for the following:    pH, Arterial 7.582 (*)    pCO2 arterial 24.7 (*)    pO2, Arterial 171.0 (*)    Acid-Base Excess 3.0 (*)    All other components within normal limits  MRSA PCR SCREENING  CULTURE, RESPIRATORY (NON-EXPECTORATED)  PHOSPHORUS  RAPID URINE DRUG SCREEN, HOSP PERFORMED  MAGNESIUM  RPR  HIV ANTIBODY (ROUTINE TESTING)  MAGNESIUM  VALPROIC ACID LEVEL  VALPROIC ACID LEVEL  TRIGLYCERIDES  PHOSPHORUS  PHOSPHORUS  MAGNESIUM  PHOSPHORUS  MAGNESIUM  PHENYTOIN LEVEL, TOTAL  GLUCOSE, CAPILLARY  PHOSPHORUS  MAGNESIUM    EKG  EKG Interpretation None       Radiology Dg Chest Port 1 View  Result Date: 06/03/2017 CLINICAL DATA:  Stroke, seizures EXAM: PORTABLE CHEST 1 VIEW COMPARISON:  06/02/2017 FINDINGS: Support devices are stable. Mild cardiomegaly and bibasilar atelectasis. Mild vascular congestion. No visible effusions or pneumothorax. IMPRESSION: Mild cardiomegaly and vascular congestion.  Bibasilar atelectasis. Electronically Signed   By: Rolm Baptise M.D.   On: 06/03/2017 07:17   Dg Chest Port 1 View  Result Date: 06/02/2017 CLINICAL DATA:  Respiratory failure EXAM: PORTABLE CHEST 1 VIEW COMPARISON:  06/01/2017 FINDINGS: Endotracheal tube tip just below the clavicular heads. An orogastric tube and side-port reaches the stomach. Right IJ central line with tip at the upper cavoatrial junction. Mild haziness of the left more than right lower chest, likely atelectasis and possibly trace fluid. No pneumothorax or air bronchogram. No pulmonary edema. Stable borderline heart size. IMPRESSION: 1. Stable positioning of tubes and central line. 2. Unchanged mild atelectasis atelectasis at the bases. Possible trace pleural fluid on the left.  Electronically Signed   By: Monte Fantasia M.D.   On: 06/02/2017 08:41    Procedures Procedures (including critical care time)  Medications Ordered in ED Medications  ondansetron (ZOFRAN) injection 4 mg (not administered)  levETIRAcetam (KEPPRA) 1,500 mg in sodium chloride 0.9 % 100 mL IVPB (0 mg Intravenous Stopped 06/03/17 0948)  LORazepam (ATIVAN) 2 MG/ML injection (not administered)  lacosamide (VIMPAT) 200 mg in sodium chloride 0.9 % 25 mL IVPB (0 mg Intravenous Stopped 06/03/17 1121)  enoxaparin (LOVENOX) injection 80 mg (80 mg Subcutaneous Given 06/03/17 0930)  fentaNYL (SUBLIMAZE) injection 50 mcg (not administered)  midazolam (VERSED) injection 1 mg (not administered)  acetaminophen (TYLENOL) solution 650 mg (not administered)  lactulose (CHRONULAC) 10 GM/15ML solution 30 g (30 g Per Tube Given 06/03/17 1512)  pantoprazole sodium (PROTONIX) 40 mg/20 mL oral suspension 40 mg (40 mg Per Tube Given 06/03/17 0751)  thiamine (VITAMIN B-1) tablet 100 mg (100 mg Per Tube Given 12/25/53 7322)  folic acid (FOLVITE) tablet 1 mg (1 mg Per Tube Given 06/03/17 0934)  norepinephrine (LEVOPHED) 4 mg in dextrose 5 % 250 mL (0.016 mg/mL) infusion (9.013 mcg/min Intravenous Rate/Dose Verify 06/03/17 1400)  phenytoin (DILANTIN) injection 100 mg (100 mg Intravenous Given 06/03/17 1412)  sodium chloride flush (NS) 0.9 % injection 10-40 mL (10 mLs Intracatheter Not Given 06/03/17 1000)  sodium chloride flush (NS) 0.9 % injection 10-40 mL (not administered)  Chlorhexidine Gluconate Cloth 2 % PADS 6 each (6 each Topical Given 06/03/17 1045)  chlorhexidine gluconate (MEDLINE KIT) (PERIDEX) 0.12 % solution 15 mL (15 mLs  Mouth Rinse Given 06/03/17 0751)  MEDLINE mouth rinse (15 mLs Mouth Rinse Given 06/03/17 1417)  feeding supplement (VITAL HIGH PROTEIN) liquid 1,000 mL (1,000 mLs Per Tube Given 06/03/17 1412)  feeding supplement (PRO-STAT SUGAR FREE 64) liquid 30 mL (30 mLs Per Tube Given 06/03/17 1412)  multivitamin  liquid 15 mL (15 mLs Per Tube Given 06/03/17 0934)  levothyroxine (SYNTHROID, LEVOTHROID) tablet 50 mcg (50 mcg Per Tube Given 06/03/17 0823)  0.9 %  sodium chloride infusion ( Intravenous Rate/Dose Verify 06/03/17 1400)  propofol (DIPRIVAN) 1000 MG/100ML infusion (20 mcg/kg/min  84.6 kg Intravenous Rate/Dose Change 06/03/17 1403)  levETIRAcetam (KEPPRA) 500 mg in sodium chloride 0.9 % 100 mL IVPB (0 mg Intravenous Stopped 05/28/17 2218)  valproate (DEPACON) 1,500 mg in dextrose 5 % 50 mL IVPB (0 mg Intravenous Stopped 05/29/17 0358)  potassium chloride SA (K-DUR,KLOR-CON) CR tablet 40 mEq (40 mEq Oral Given 05/29/17 0930)  magnesium sulfate IVPB 4 g 100 mL (0 g Intravenous Stopped 05/29/17 1246)  levETIRAcetam (KEPPRA) 1,000 mg in sodium chloride 0.9 % 100 mL IVPB (0 mg Intravenous Stopped 05/29/17 1559)  potassium chloride SA (K-DUR,KLOR-CON) CR tablet 40 mEq (40 mEq Oral Given 05/30/17 1839)  magnesium sulfate IVPB 1 g 100 mL (0 g Intravenous Stopped 05/30/17 1352)  LORazepam (ATIVAN) injection 2 mg (2 mg Intravenous Given 05/30/17 1940)  LORazepam (ATIVAN) injection 2 mg (2 mg Intravenous Given 05/30/17 2001)  LORazepam (ATIVAN) 2 MG/ML injection (2 mg  Given 05/30/17 1959)  lacosamide (VIMPAT) 100 mg in sodium chloride 0.9 % 25 mL IVPB (0 mg Intravenous Stopped 05/31/17 2030)  fosPHENYtoin (CEREBYX) 1,224 mg PE in sodium chloride 0.9 % 50 mL IVPB (0 mg PE Intravenous Stopped 06/01/17 0701)  fentaNYL (SUBLIMAZE) 100 MCG/2ML injection (100 mcg  Given 06/01/17 0755)  midazolam (VERSED) 2 MG/2ML injection (2 mg  Given 06/01/17 0756)  etomidate (AMIDATE) injection 10 mg (10 mg Intravenous Given 06/01/17 0757)  potassium chloride 20 MEQ/15ML (10%) solution 40 mEq (40 mEq Per Tube Given 06/02/17 1458)  magnesium sulfate IVPB 2 g 50 mL (0 g Intravenous Stopped 06/02/17 1106)  sodium chloride 0.9 % bolus 1,000 mL (1,000 mLs Intravenous New Bag/Given 06/02/17 1702)  sodium chloride 0.9 % bolus 1,000 mL (0 mLs  Intravenous Duplicate 1/61/09 6045)  potassium chloride 20 MEQ/15ML (10%) solution 30 mEq (30 mEq Per Tube Given 06/03/17 0934)     Initial Impression / Assessment and Plan / ED Course  I have reviewed the triage vital signs and the nursing notes.  Pertinent labs & imaging results that were available during my care of the patient were reviewed by me and considered in my medical decision making (see chart for details).     Patient is a 76 year old male with history of alcohol dependence, heroin abuse, prior CVA with residual left hemiparesis, nonischemic CM (EF 40%), AF on eliquis and recent admission with concern for partial complex seizures and resulting SNF placement who presents with son with complaints of increased weakness and shaking. Patient was admitted 8/8 for new onset seizures with left face arm and leg twitching. Patient arrived HDS, alert to person, exam otherwise as above concerning for persistent seizure like activity.   CT head stable. MRI consistent with seizure activity.   [facility] for admission for persistent partial seizures. Patient was loaded with keppra IV. Son in agreement with plan at time of admission.  Final Clinical Impressions(s) / ED Diagnoses   Final diagnoses:  Seizure-like activity ([place])  New Prescriptions Current Discharge Medication List       Arnetha Massy, MD 06/03/17 [place], [place], MD 06/09/17 2306

## 2017-05-28 NOTE — ED Notes (Signed)
RN attempted IV x1 with no success.

## 2017-05-28 NOTE — ED Notes (Signed)
Pt in CT.

## 2017-05-28 NOTE — ED Triage Notes (Signed)
Per [facility], Pt from [facility]. Pt complaining of weakness that started on Monday. Pt reports he noticed a tremor in his L arm and L foot. Pt reports he was ambulatory on Monday. Pt reports worsening since Monday, pt unable to ambulate. Pt has history of TIA 2 weeks ago, heroin withdrawal 2 weeks ago, new onset seizure 2 weeks ago. Pt reports difficulty urinating this morning, requiring cath. Pt reports he went to [facility] yesterday for "liquid ativan". Pt alert and oriented x4. Pt has tremors to L arm, difficulty extending arm and fingers. Pt unable to hold his L arm and bilateral lower extremities up.

## 2017-05-29 ENCOUNTER — Inpatient Hospital Stay (HOSPITAL_COMMUNITY): Payer: Medicare Other

## 2017-05-29 ENCOUNTER — Emergency Department (HOSPITAL_COMMUNITY): Payer: Medicare Other

## 2017-05-29 DIAGNOSIS — J9610 Chronic respiratory failure, unspecified whether with hypoxia or hypercapnia: Secondary | ICD-10-CM | POA: Diagnosis not present

## 2017-05-29 DIAGNOSIS — K7030 Alcoholic cirrhosis of liver without ascites: Secondary | ICD-10-CM | POA: Diagnosis not present

## 2017-05-29 DIAGNOSIS — J9600 Acute respiratory failure, unspecified whether with hypoxia or hypercapnia: Secondary | ICD-10-CM | POA: Diagnosis not present

## 2017-05-29 DIAGNOSIS — I110 Hypertensive heart disease with heart failure: Secondary | ICD-10-CM | POA: Diagnosis present

## 2017-05-29 DIAGNOSIS — B1920 Unspecified viral hepatitis C without hepatic coma: Secondary | ICD-10-CM | POA: Diagnosis present

## 2017-05-29 DIAGNOSIS — I4891 Unspecified atrial fibrillation: Secondary | ICD-10-CM | POA: Diagnosis not present

## 2017-05-29 DIAGNOSIS — L03221 Cellulitis of neck: Secondary | ICD-10-CM | POA: Diagnosis not present

## 2017-05-29 DIAGNOSIS — Z430 Encounter for attention to tracheostomy: Secondary | ICD-10-CM | POA: Diagnosis not present

## 2017-05-29 DIAGNOSIS — I480 Paroxysmal atrial fibrillation: Secondary | ICD-10-CM | POA: Diagnosis not present

## 2017-05-29 DIAGNOSIS — Z452 Encounter for adjustment and management of vascular access device: Secondary | ICD-10-CM | POA: Diagnosis not present

## 2017-05-29 DIAGNOSIS — I452 Bifascicular block: Secondary | ICD-10-CM | POA: Diagnosis present

## 2017-05-29 DIAGNOSIS — J9811 Atelectasis: Secondary | ICD-10-CM | POA: Diagnosis not present

## 2017-05-29 DIAGNOSIS — Z9911 Dependence on respirator [ventilator] status: Secondary | ICD-10-CM | POA: Diagnosis not present

## 2017-05-29 DIAGNOSIS — I4892 Unspecified atrial flutter: Secondary | ICD-10-CM | POA: Diagnosis not present

## 2017-05-29 DIAGNOSIS — J690 Pneumonitis due to inhalation of food and vomit: Secondary | ICD-10-CM | POA: Diagnosis not present

## 2017-05-29 DIAGNOSIS — E876 Hypokalemia: Secondary | ICD-10-CM

## 2017-05-29 DIAGNOSIS — J189 Pneumonia, unspecified organism: Secondary | ICD-10-CM | POA: Diagnosis not present

## 2017-05-29 DIAGNOSIS — I481 Persistent atrial fibrillation: Secondary | ICD-10-CM

## 2017-05-29 DIAGNOSIS — E870 Hyperosmolality and hypernatremia: Secondary | ICD-10-CM | POA: Diagnosis present

## 2017-05-29 DIAGNOSIS — Z7189 Other specified counseling: Secondary | ICD-10-CM | POA: Diagnosis not present

## 2017-05-29 DIAGNOSIS — G40201 Localization-related (focal) (partial) symptomatic epilepsy and epileptic syndromes with complex partial seizures, not intractable, with status epilepticus: Secondary | ICD-10-CM | POA: Diagnosis not present

## 2017-05-29 DIAGNOSIS — K7210 Chronic hepatic failure without coma: Secondary | ICD-10-CM | POA: Diagnosis present

## 2017-05-29 DIAGNOSIS — G40109 Localization-related (focal) (partial) symptomatic epilepsy and epileptic syndromes with simple partial seizures, not intractable, without status epilepticus: Secondary | ICD-10-CM | POA: Diagnosis not present

## 2017-05-29 DIAGNOSIS — K8020 Calculus of gallbladder without cholecystitis without obstruction: Secondary | ICD-10-CM | POA: Diagnosis not present

## 2017-05-29 DIAGNOSIS — G9340 Encephalopathy, unspecified: Secondary | ICD-10-CM | POA: Diagnosis not present

## 2017-05-29 DIAGNOSIS — J9602 Acute respiratory failure with hypercapnia: Secondary | ICD-10-CM | POA: Diagnosis not present

## 2017-05-29 DIAGNOSIS — D689 Coagulation defect, unspecified: Secondary | ICD-10-CM | POA: Diagnosis not present

## 2017-05-29 DIAGNOSIS — I69391 Dysphagia following cerebral infarction: Secondary | ICD-10-CM

## 2017-05-29 DIAGNOSIS — J181 Lobar pneumonia, unspecified organism: Secondary | ICD-10-CM | POA: Diagnosis not present

## 2017-05-29 DIAGNOSIS — Z8673 Personal history of transient ischemic attack (TIA), and cerebral infarction without residual deficits: Secondary | ICD-10-CM | POA: Diagnosis not present

## 2017-05-29 DIAGNOSIS — Z515 Encounter for palliative care: Secondary | ICD-10-CM | POA: Diagnosis not present

## 2017-05-29 DIAGNOSIS — Z4682 Encounter for fitting and adjustment of non-vascular catheter: Secondary | ICD-10-CM | POA: Diagnosis not present

## 2017-05-29 DIAGNOSIS — K7031 Alcoholic cirrhosis of liver with ascites: Secondary | ICD-10-CM | POA: Diagnosis present

## 2017-05-29 DIAGNOSIS — I69354 Hemiplegia and hemiparesis following cerebral infarction affecting left non-dominant side: Secondary | ICD-10-CM | POA: Diagnosis not present

## 2017-05-29 DIAGNOSIS — G40901 Epilepsy, unspecified, not intractable, with status epilepticus: Secondary | ICD-10-CM | POA: Diagnosis not present

## 2017-05-29 DIAGNOSIS — E872 Acidosis: Secondary | ICD-10-CM | POA: Diagnosis present

## 2017-05-29 DIAGNOSIS — F1020 Alcohol dependence, uncomplicated: Secondary | ICD-10-CM | POA: Diagnosis present

## 2017-05-29 DIAGNOSIS — K661 Hemoperitoneum: Secondary | ICD-10-CM | POA: Diagnosis not present

## 2017-05-29 DIAGNOSIS — J988 Other specified respiratory disorders: Secondary | ICD-10-CM | POA: Diagnosis not present

## 2017-05-29 DIAGNOSIS — G40101 Localization-related (focal) (partial) symptomatic epilepsy and epileptic syndromes with simple partial seizures, not intractable, with status epilepticus: Secondary | ICD-10-CM | POA: Diagnosis present

## 2017-05-29 DIAGNOSIS — R1310 Dysphagia, unspecified: Secondary | ICD-10-CM | POA: Diagnosis present

## 2017-05-29 DIAGNOSIS — Z87898 Personal history of other specified conditions: Secondary | ICD-10-CM | POA: Diagnosis not present

## 2017-05-29 DIAGNOSIS — I4581 Long QT syndrome: Secondary | ICD-10-CM | POA: Diagnosis present

## 2017-05-29 DIAGNOSIS — J9690 Respiratory failure, unspecified, unspecified whether with hypoxia or hypercapnia: Secondary | ICD-10-CM | POA: Diagnosis not present

## 2017-05-29 DIAGNOSIS — J90 Pleural effusion, not elsewhere classified: Secondary | ICD-10-CM | POA: Diagnosis not present

## 2017-05-29 DIAGNOSIS — I5022 Chronic systolic (congestive) heart failure: Secondary | ICD-10-CM | POA: Diagnosis not present

## 2017-05-29 DIAGNOSIS — K7290 Hepatic failure, unspecified without coma: Secondary | ICD-10-CM | POA: Diagnosis not present

## 2017-05-29 DIAGNOSIS — R569 Unspecified convulsions: Secondary | ICD-10-CM | POA: Diagnosis present

## 2017-05-29 DIAGNOSIS — R601 Generalized edema: Secondary | ICD-10-CM | POA: Diagnosis not present

## 2017-05-29 DIAGNOSIS — E43 Unspecified severe protein-calorie malnutrition: Secondary | ICD-10-CM | POA: Diagnosis not present

## 2017-05-29 DIAGNOSIS — Z978 Presence of other specified devices: Secondary | ICD-10-CM | POA: Diagnosis not present

## 2017-05-29 DIAGNOSIS — N179 Acute kidney failure, unspecified: Secondary | ICD-10-CM | POA: Diagnosis not present

## 2017-05-29 DIAGNOSIS — G40211 Localization-related (focal) (partial) symptomatic epilepsy and epileptic syndromes with complex partial seizures, intractable, with status epilepticus: Secondary | ICD-10-CM | POA: Diagnosis not present

## 2017-05-29 DIAGNOSIS — I482 Chronic atrial fibrillation: Secondary | ICD-10-CM | POA: Diagnosis present

## 2017-05-29 DIAGNOSIS — J9601 Acute respiratory failure with hypoxia: Secondary | ICD-10-CM | POA: Diagnosis not present

## 2017-05-29 DIAGNOSIS — K7460 Unspecified cirrhosis of liver: Secondary | ICD-10-CM | POA: Diagnosis not present

## 2017-05-29 DIAGNOSIS — J9620 Acute and chronic respiratory failure, unspecified whether with hypoxia or hypercapnia: Secondary | ICD-10-CM | POA: Diagnosis not present

## 2017-05-29 DIAGNOSIS — I426 Alcoholic cardiomyopathy: Secondary | ICD-10-CM | POA: Diagnosis present

## 2017-05-29 DIAGNOSIS — K567 Ileus, unspecified: Secondary | ICD-10-CM | POA: Diagnosis not present

## 2017-05-29 DIAGNOSIS — K7200 Acute and subacute hepatic failure without coma: Secondary | ICD-10-CM | POA: Diagnosis not present

## 2017-05-29 LAB — COMPREHENSIVE METABOLIC PANEL
ALT: 13 U/L — AB (ref 17–63)
AST: 35 U/L (ref 15–41)
Albumin: 3 g/dL — ABNORMAL LOW (ref 3.5–5.0)
Alkaline Phosphatase: 59 U/L (ref 38–126)
Anion gap: 10 (ref 5–15)
BUN: 9 mg/dL (ref 6–20)
CHLORIDE: 106 mmol/L (ref 101–111)
CO2: 24 mmol/L (ref 22–32)
CREATININE: 1.04 mg/dL (ref 0.61–1.24)
Calcium: 8.8 mg/dL — ABNORMAL LOW (ref 8.9–10.3)
GFR calc non Af Amer: >60 mL/min (ref >60)
Glucose, Bld: 86 mg/dL (ref 65–99)
POTASSIUM: 3 mmol/L — AB (ref 3.5–5.1)
SODIUM: 140 mmol/L (ref 135–145)
Total Bilirubin: 1.5 mg/dL — ABNORMAL HIGH (ref 0.3–1.2)
Total Protein: 6.7 g/dL (ref 6.5–8.1)

## 2017-05-29 LAB — CBC
HCT: 36.3 % — ABNORMAL LOW (ref 39.0–52.0)
Hemoglobin: 12.8 g/dL — ABNORMAL LOW (ref 13.0–17.0)
MCH: 30.8 pg (ref 26.0–34.0)
MCHC: 35.3 g/dL (ref 30.0–36.0)
MCV: 87.5 fL (ref 78.0–100.0)
PLATELETS: 122 10*3/uL — AB (ref 150–400)
RBC: 4.15 MIL/uL — AB (ref 4.22–5.81)
RDW: 15.9 % — ABNORMAL HIGH (ref 11.5–15.5)
WBC: 4.9 10*3/uL (ref 4.0–10.5)

## 2017-05-29 LAB — MRSA PCR SCREENING: MRSA by PCR: NEGATIVE

## 2017-05-29 LAB — URINE CULTURE: CULTURE: NO GROWTH

## 2017-05-29 LAB — URINALYSIS, ROUTINE W REFLEX MICROSCOPIC
Bilirubin Urine: NEGATIVE
Glucose, UA: NEGATIVE mg/dL
Hgb urine dipstick: NEGATIVE
KETONES UR: 5 mg/dL — AB
LEUKOCYTES UA: NEGATIVE
NITRITE: NEGATIVE
PH: 8 (ref 5.0–8.0)
PROTEIN: NEGATIVE mg/dL
Specific Gravity, Urine: 1.01 (ref 1.005–1.030)

## 2017-05-29 LAB — RAPID URINE DRUG SCREEN, HOSP PERFORMED
AMPHETAMINES: NOT DETECTED
BENZODIAZEPINES: NOT DETECTED
Barbiturates: NOT DETECTED
COCAINE: NOT DETECTED
OPIATES: NOT DETECTED
TETRAHYDROCANNABINOL: NOT DETECTED

## 2017-05-29 MED ORDER — DEXTROSE 5 % IV SOLN
1500.0000 mg | Freq: Once | INTRAVENOUS | Status: AC
  Administered 2017-05-29: 1500 mg via INTRAVENOUS
  Filled 2017-05-29: qty 15

## 2017-05-29 MED ORDER — FOLIC ACID 1 MG PO TABS
1.0000 mg | ORAL_TABLET | Freq: Every day | ORAL | Status: DC
  Administered 2017-05-29 – 2017-05-31 (×3): 1 mg via ORAL
  Filled 2017-05-29 (×3): qty 1

## 2017-05-29 MED ORDER — DABIGATRAN ETEXILATE MESYLATE 150 MG PO CAPS
150.0000 mg | ORAL_CAPSULE | Freq: Two times a day (BID) | ORAL | Status: DC
  Administered 2017-05-29 – 2017-05-31 (×5): 150 mg via ORAL
  Filled 2017-05-29 (×6): qty 1

## 2017-05-29 MED ORDER — ACETAMINOPHEN 650 MG RE SUPP: 650.0000 mg | Freq: Four times a day (QID) | RECTAL | Status: DC | PRN

## 2017-05-29 MED ORDER — LACTULOSE 10 GM/15ML PO SOLN
10.0000 g | Freq: Two times a day (BID) | ORAL | Status: DC
  Administered 2017-05-29 – 2017-05-31 (×4): 10 g via ORAL
  Filled 2017-05-29 (×5): qty 15

## 2017-05-29 MED ORDER — SODIUM CHLORIDE 0.9 % IV SOLN
1500.0000 mg | Freq: Two times a day (BID) | INTRAVENOUS | Status: DC
  Administered 2017-05-29 – 2017-06-10 (×25): 1500 mg via INTRAVENOUS
  Filled 2017-05-29 (×29): qty 15

## 2017-05-29 MED ORDER — VITAMIN B-1 100 MG PO TABS
100.0000 mg | ORAL_TABLET | Freq: Every day | ORAL | Status: DC
  Administered 2017-05-29 – 2017-05-31 (×3): 100 mg via ORAL
  Filled 2017-05-29 (×3): qty 1

## 2017-05-29 MED ORDER — ACETAMINOPHEN 325 MG PO TABS
650.0000 mg | ORAL_TABLET | Freq: Four times a day (QID) | ORAL | Status: DC | PRN
  Administered 2017-05-30: 650 mg via ORAL
  Filled 2017-05-29: qty 2

## 2017-05-29 MED ORDER — MAGNESIUM SULFATE 4 GM/100ML IV SOLN
4.0000 g | Freq: Once | INTRAVENOUS | Status: AC
  Administered 2017-05-29: 4 g via INTRAVENOUS
  Filled 2017-05-29: qty 100

## 2017-05-29 MED ORDER — LEVOTHYROXINE SODIUM 50 MCG PO TABS
50.0000 ug | ORAL_TABLET | Freq: Every day | ORAL | Status: DC
  Administered 2017-05-29 – 2017-05-31 (×3): 50 ug via ORAL
  Filled 2017-05-29 (×3): qty 1

## 2017-05-29 MED ORDER — SODIUM CHLORIDE 0.9 % IV SOLN
1000.0000 mg | Freq: Once | INTRAVENOUS | Status: AC
  Administered 2017-05-29: 1000 mg via INTRAVENOUS
  Filled 2017-05-29: qty 10

## 2017-05-29 MED ORDER — MAGNESIUM OXIDE 400 (241.3 MG) MG PO TABS
400.0000 mg | ORAL_TABLET | Freq: Every day | ORAL | Status: DC
  Administered 2017-05-29: 400 mg via ORAL
  Filled 2017-05-29: qty 1

## 2017-05-29 MED ORDER — POTASSIUM CHLORIDE CRYS ER 20 MEQ PO TBCR
40.0000 meq | EXTENDED_RELEASE_TABLET | Freq: Once | ORAL | Status: AC
  Administered 2017-05-29: 40 meq via ORAL
  Filled 2017-05-29: qty 2

## 2017-05-29 MED ORDER — VITAMIN D 1000 UNITS PO TABS
5000.0000 [IU] | ORAL_TABLET | Freq: Every day | ORAL | Status: DC
  Administered 2017-05-29 – 2017-05-31 (×3): 5000 [IU] via ORAL
  Filled 2017-05-29 (×3): qty 5

## 2017-05-29 MED ORDER — VALPROATE SODIUM 500 MG/5ML IV SOLN
500.0000 mg | Freq: Three times a day (TID) | INTRAVENOUS | Status: DC
  Administered 2017-05-29 – 2017-05-30 (×5): 500 mg via INTRAVENOUS
  Filled 2017-05-29 (×7): qty 5

## 2017-05-29 MED ORDER — SODIUM CHLORIDE 0.9 % IV SOLN
1000.0000 mg | Freq: Two times a day (BID) | INTRAVENOUS | Status: DC
  Administered 2017-05-29: 1000 mg via INTRAVENOUS
  Filled 2017-05-29 (×2): qty 10

## 2017-05-29 MED ORDER — ONDANSETRON HCL 4 MG/2ML IJ SOLN
4.0000 mg | Freq: Four times a day (QID) | INTRAMUSCULAR | Status: DC | PRN
  Administered 2017-06-03: 4 mg via INTRAVENOUS
  Filled 2017-05-29: qty 2

## 2017-05-29 MED ORDER — METOPROLOL TARTRATE 12.5 MG HALF TABLET
12.5000 mg | ORAL_TABLET | Freq: Two times a day (BID) | ORAL | Status: DC
  Administered 2017-05-29 – 2017-05-31 (×5): 12.5 mg via ORAL
  Filled 2017-05-29 (×5): qty 1

## 2017-05-29 MED ORDER — ONDANSETRON HCL 4 MG PO TABS: 4.0000 mg | ORAL_TABLET | Freq: Four times a day (QID) | ORAL | Status: DC | PRN

## 2017-05-29 NOTE — Procedures (Signed)
Date of recording 05/29/2017  Referring physician Jennette Kettle  Reason for the study 76 year old male with right MCA stroke experiencing intermittent jerking of left arm without any loss of consciousness  Technical Digital EEG recording using 10-20 international electrode system  Description of the recording The left posterior dominant rhythm is 7 Hz, on the right is 3-4 Hz Right hemispheric periodic lateralized epileptiform discharges PLEDs, at times is rhythmic and sometimes associated with intermittent jerking of left hand Almost arrhythmic continuous PLEDs and delta slowing involving the right hemisphere Sleep was not obtained.  Impression The EEG is abnormal and findings are consistent with 1- Focal right cerebral dysfunction consistent with patient's history of right MCA stroke 2- Frequent right hemispheric focal seizures.

## 2017-05-29 NOTE — ED Notes (Signed)
Pt in MRI.

## 2017-05-29 NOTE — Progress Notes (Signed)
Bedside EEG completed, results pending. 

## 2017-05-29 NOTE — H&P (Signed)
History and Physical    [name] DXI:[id number] DOB: 06/25/1941 DOA: 05/28/2017  PCP: Thressa Sheller, MD  Patient coming from: SNF  I have personally briefly reviewed patient's old medical records in [place]  Chief Complaint: Tremors  HPI: Nathan Frank is a 76 y.o. male with medical history significant of CVA, residual L sided hemiparesis, recent admit for likely new onset partial seizures.  He has been in an SNF since just after discharge on the 13th.  Prior to admission to hospital on 8/8, his son reports that patient had been on several "benders" using heroin and EtOH leading up to that time.  Patient presents to the ED with c/o ongoing weakness and tremor in L arm and L foot that onset on Monday.  Symptoms are presistent.  Symptoms occur despite patient being on Keppra 1gm BID.   ED Course: MRI brain confirms probable ongoing seizures with restricted diffusion.  Dr. Cheral Marker at bedside.   Review of Systems: As per HPI otherwise 10 point review of systems negative.   Past Medical History:  Diagnosis Date  . Abnormal TSH   . Acute gastric ulcer   . Acute gastritis with hemorrhage   . Alcohol abuse   . Alcohol dependence ([place]) 02/02/2014  . Anemia   . Bifascicular block   . Cirrhosis ([place]) 06/04/2012  . Depressive disorder 02/01/2014  . Dizziness and giddiness 12/13/2014  . DVT of lower limb, acute ([place]) 06/06/2012  . DVT, lower extremity, recurrent ([place])    a. noted 2013. b. also dx 06/2016.  Marland Kitchen Essential hypertension   . Fatty liver   . Gallstones   . Gastritis 06/07/2012  . GERD (gastroesophageal reflux disease)   . GI bleed due to NSAIDs 12/13/2014  . Granulomatous gastritis   . Hematuria 06/04/2012  . Hepatitis C   . Hepatocellular carcinoma ([place])   . Heroin abuse    "I haven't done that since I don't know when."  . Heroin overdose 02/20/2014  . Neuropathy   . NICM (nonischemic cardiomyopathy) ([place])    a. 04/2015: EF 45-50% by cath. b. EF 40-45% by echo  06/2016.  . NSTEMI (non-ST elevated myocardial infarction) ([place])    a. 04/2015 - patent coronaries. Etiology possibly due to coronary spasm versus embolus, stress cardiomyopathy (atypical), and aborted infarction related to plaque rupture with thrombosis and dissolution. Amlodipine started. Not on antiplatelets due to GIB/cirrhosis history.  . Oral thrush 06/05/2012  . Polysubstance abuse    THC, alcohol, heroin  . Prolonged Q-T interval on ECG    a. 12/2014 - treated with magnesium.  . Right knee pain 12/13/2014  . S/P alcohol detoxification 02/02/2014  . Stroke ([place]) 06/2016  . SVT (supraventricular tachycardia) ([place])    a. 12/2014 in setting of GIB, ETOH, NSAIDS, gastritis.  . Symptomatic cholelithiasis 12/15/2013  . Thrombocytopenia ([place])   . Upper GI bleeding 12/13/2014  . Weight loss 06/04/2012    Past Surgical History:  Procedure Laterality Date  . CARDIAC CATHETERIZATION N/A 04/15/2015   Procedure: Left Heart Cath and Coronary Angiography;  Surgeon: Belva Crome, MD;  Location: [facility];  Service: Cardiovascular;  Laterality: N/A;  . CIRCUMCISION    . ESOPHAGOGASTRODUODENOSCOPY  06/07/2012   Procedure: ESOPHAGOGASTRODUODENOSCOPY (EGD);  Surgeon: Milus Banister, MD;  Location: [facility];  Service: Endoscopy;  Laterality: N/A;  may need treatment of varices  . ESOPHAGOGASTRODUODENOSCOPY N/A 12/14/2014   Procedure: ESOPHAGOGASTRODUODENOSCOPY (EGD);  Surgeon: Jerene Bears, MD;  Location:  [facility];  Service: Endoscopy;  Laterality: N/A;  . IR GENERIC HISTORICAL  06/25/2016   IR US GUIDE VASC ACCESS LEFT 06/25/2016 Corrie Mckusick, DO [facility]  . IR GENERIC HISTORICAL  06/25/2016   IR RADIOLOGY PERIPHERAL GUIDED IV START 06/25/2016 Corrie Mckusick, DO [facility]  . IR GENERIC HISTORICAL  06/25/2016   IR PERCUTANEOUS ART THROMBECTOMY/INFUSION INTRACRANIAL INC DIAG ANGIO 06/25/2016 Luanne Bras, MD [facility]  . RADIOLOGY WITH ANESTHESIA N/A 06/25/2016   Procedure: RADIOLOGY  WITH ANESTHESIA;  Surgeon: Luanne Bras, MD;  Location: [facility];  Service: Radiology;  Laterality: N/A;  . TONSILLECTOMY       reports that he has quit smoking. His smoking use included Cigarettes. He has a 2.50 pack-year smoking history. He has never used smokeless tobacco. He reports that he does not drink alcohol or use drugs.  Allergies  Allergen Reactions  . Nsaids Other (See Comments)    Acute gastric ulcers  . Oxycodone Other (See Comments)    Per the risk calculating tool RIOSORD: (Risk Index for Overdose or Serious Opioid -induced Respiratory Depression Risk ) calculated risk in ensuing 6 months  = 83% ( see Problem List for discussion)    Family History  Problem Relation Age of Onset  . Stroke Father   . Prostate cancer Brother   . Heart disease Mother        Pacemaker  . Diabetes Neg Hx      Prior to Admission medications   Medication Sig Start Date End Date Taking? Authorizing Provider  Cholecalciferol (VITAMIN D-3) 5000 units TABS Take 5,000 Units by mouth daily.    Yes [provider]  dabigatran (PRADAXA) 150 MG CAPS capsule Take 1 capsule (150 mg total) by mouth every 12 (twelve) hours. 05/17/17  Yes Florencia Reasons, MD  folic acid (FOLVITE) 1 MG tablet Take 1 mg by mouth daily. 12/03/16  Yes [provider]  lactulose (CHRONULAC) 10 GM/15ML solution Take 15 mLs (10 g total) by mouth 2 (two) times daily. 05/17/17  Yes Florencia Reasons, MD  levETIRAcetam (KEPPRA) 1000 MG tablet Take 1,000 mg by mouth 2 (two) times daily. 05/17/17  Yes [provider]  levothyroxine (SYNTHROID, LEVOTHROID) 50 MCG tablet Take 50 mcg by mouth daily before breakfast.  12/29/16  Yes [provider]  magnesium oxide (MAG-OX) 400 (241.3 Mg) MG tablet Take 1 tablet (400 mg total) by mouth daily. 01/25/17  Yes Fay Records, MD  Melatonin 5 MG CAPS Take 5 mg by mouth at bedtime.    Yes [provider]  metoprolol tartrate (LOPRESSOR) 25 MG tablet Take 0.5 tablets  (12.5 mg total) by mouth 2 (two) times daily. 11/15/16 05/28/17 Yes Reyne Dumas, MD  thiamine 100 MG tablet Take 100 mg by mouth daily.   Yes [provider]    Physical Exam: Vitals:   05/28/17 2330 05/29/17 0000 05/29/17 0015 05/29/17 0141  BP: (!) 146/89 (!) 157/89 133/90 139/89  Pulse: 96 (!) 104 93 94  Resp: 16 16 (!) 21 15  SpO2: 100% 99% 99% 100%  Weight:  81.6 kg (180 lb)    Height:  5\' 11"  (1.803 m)      Constitutional: NAD, calm, comfortable Eyes: PERRL, lids and conjunctivae normal ENMT: Mucous membranes are moist. Posterior pharynx clear of any exudate or lesions.Normal dentition.  Neck: normal, supple, no masses, no thyromegaly Respiratory: clear to auscultation bilaterally, no wheezing, no crackles. Normal respiratory effort. No accessory muscle use.  Cardiovascular: Regular rate  and rhythm, no murmurs / rubs / gallops. No extremity edema. 2+ pedal pulses. No carotid bruits.  Abdomen: no tenderness, no masses palpated. No hepatosplenomegaly. Bowel sounds positive.  Musculoskeletal: no clubbing / cyanosis. No joint deformity upper and lower extremities. Good ROM, no contractures. Normal muscle tone.  Skin: no rashes, lesions, ulcers. No induration Neurologic: L sided weakness, tremor Psychiatric: Suspect there may be some underlying confabulation, h/o patients Son seems more reliable.  See also Dr. Hayden Pedro ED note from yesterday.  Note that for patients statements there to be correct, he would have to be doing heavy EtOH and heroin while in the SNF (which seems less likely).   Labs on Admission: I have personally reviewed following labs and imaging studies  CBC:  Recent Labs Lab 05/27/17 1409 05/28/17 2052  WBC 3.4* 4.5  NEUTROABS 2.1 2.6  HGB 13.3 12.1*  HCT 39.0 35.1*  MCV 89.0 88.4  PLT 163 161*   Basic Metabolic Panel:  Recent Labs Lab 05/27/17 1409 05/28/17 2052  NA 140 140  K 3.8 3.1*  CL 108 106  CO2 25 25  GLUCOSE 97 91  BUN 9 10    CREATININE 0.90 1.06  CALCIUM 9.0 8.9  MG  --  1.5*  PHOS  --  2.6   GFR: Estimated Creatinine Clearance: 63.1 mL/min (by C-G formula based on SCr of 1.06 mg/dL). Liver Function Tests:  Recent Labs Lab 05/27/17 1409 05/28/17 2052  AST 38 37  ALT 13* 12*  ALKPHOS 65 56  BILITOT 0.8 1.3*  PROT 7.0 6.8  ALBUMIN 3.1* 2.9*    Recent Labs Lab 05/27/17 1409  LIPASE 27   No results for input(s): AMMONIA in the last 168 hours. Coagulation Profile: No results for input(s): INR, PROTIME in the last 168 hours. Cardiac Enzymes: No results for input(s): CKTOTAL, CKMB, CKMBINDEX, TROPONINI in the last 168 hours. BNP (last 3 results) No results for input(s): PROBNP in the last 8760 hours. HbA1C: No results for input(s): HGBA1C in the last 72 hours. CBG: No results for input(s): GLUCAP in the last 168 hours. Lipid Profile: No results for input(s): CHOL, HDL, LDLCALC, TRIG, CHOLHDL, LDLDIRECT in the last 72 hours. Thyroid Function Tests: No results for input(s): TSH, T4TOTAL, FREET4, T3FREE, THYROIDAB in the last 72 hours. Anemia Panel: No results for input(s): VITAMINB12, FOLATE, FERRITIN, TIBC, IRON, RETICCTPCT in the last 72 hours. Urine analysis:    Component Value Date/Time   COLORURINE YELLOW 05/27/2017 [place] 05/27/2017 1639   LABSPEC 1.019 05/27/2017 1639   PHURINE 6.0 05/27/2017 1639   GLUCOSEU NEGATIVE 05/27/2017 1639   HGBUR LARGE (A) 05/27/2017 1639   BILIRUBINUR NEGATIVE 05/27/2017 [place] 05/27/2017 1639   PROTEINUR 30 (A) 05/27/2017 1639   UROBILINOGEN 2.0 (H) 12/13/2014 1256   NITRITE NEGATIVE 05/27/2017 1639   LEUKOCYTESUR NEGATIVE 05/27/2017 1639    Radiological Exams on Admission: Ct Head Wo Contrast  Result Date: 05/28/2017 CLINICAL DATA:  Extremity weakness. EXAM: CT HEAD WITHOUT CONTRAST TECHNIQUE: Contiguous axial images were obtained from the base of the skull through the vertex without intravenous contrast.  COMPARISON:  CT scan of May 12, 2017. FINDINGS: Brain: Encephalomalacia of right MCA territory is noted consistent with old infarction. Old left posterior parietal infarction is noted as well. No mass effect or midline shift is noted. Ventricular size is within normal limits. There is no evidence of mass lesion, hemorrhage or acute infarction. Vascular: No hyperdense vessel or unexpected calcification. Skull:  Normal. Negative for fracture or focal lesion. Sinuses/Orbits: No acute finding. Other: None. IMPRESSION: Old large right MCA infarction as described above. No acute intracranial abnormality seen. Electronically Signed   By: Marijo Conception, M.D.   On: 05/28/2017 20:38   Mr Brain Wo Contrast  Result Date: 05/29/2017 CLINICAL DATA:  76 year old male with weakness for several days. Left extremity tremor. Recent seizure activity. EXAM: MRI HEAD WITHOUT CONTRAST TECHNIQUE: Multiplanar, multiecho pulse sequences of the brain and surrounding structures were obtained without intravenous contrast. COMPARISON:  Head CT without contrast 05/28/2017. Brain MRI 05/12/2017, and earlier. FINDINGS: Study is intermittently degraded by motion artifact despite repeated imaging attempts. Diagnostic axial FLAIR imaging could not be obtained. Brain: Gyriform restricted diffusion throughout much of the posterior hemisphere. Involvement of the posterosuperior right frontal lobe, right parietal lobe, and the right temporal lobe including the right hippocampal formation (series 4, image 16). This diffusion abnormality is new compared to 05/12/2017. No contralateral left hemisphere and no posterior fossa restricted diffusion today. Underlying widespread right hemisphere encephalomalacia, primarily throughout the right MCA territory. Significant right temporal lobe involvement. Posterior left MCA encephalomalacia. Associated bilateral hemisphere hemosiderin. No acute hemorrhage identified. Stable cerebral volume. Stable ventricle  size and configuration. No midline shift, mass effect, or evidence of intracranial mass lesion. The thalami I, brainstem, and cerebellum appear stable and within normal limits. Vascular: Major intracranial vascular flow voids are stable. Skull and upper cervical spine: Negative. Normal bone marrow signal. Sinuses/Orbits: Stable and negative. Other: Mastoid air cells remain clear. IMPRESSION: 1. Widespread gyriform restricted diffusion in the right hemisphere including the right hippocampus. This is most compatible with sequelae of seizure activity. 2. No other acute intracranial abnormality. Underlying chronic right greater than left hemispheric encephalomalacia. Electronically Signed   By: Genevie Ann M.D.   On: 05/29/2017 01:41   Dg Chest Portable 1 View  Result Date: 05/28/2017 CLINICAL DATA:  Weakness and new onset seizures. EXAM: PORTABLE CHEST 1 VIEW COMPARISON:  11/12/2016 FINDINGS: 1946 hours. The cardio pericardial silhouette is enlarged. There is pulmonary vascular congestion without overt pulmonary edema. Probable minimal basilar atelectasis. No edema, focal airspace consolidation, or substantial pleural effusion. Bones are diffusely demineralized. Telemetry leads overlie the chest. IMPRESSION: Cardiomegaly with vascular congestion. Electronically Signed   By: Misty Stanley M.D.   On: 05/28/2017 20:06    EKG: Independently reviewed.  Assessment/Plan Principal Problem:   Complex partial status epilepticus (HCC) Active Problems:   Dysphagia, post-stroke   Chronic systolic CHF (congestive heart failure) (HCC)   Simple partial seizure disorder (HCC)   History of CVA (cerebrovascular accident)   History of alcohol abuse    1. Partial status epilepticus - unclear wether simple (and his underlying confusion is baseline due to vascular dementia / korsakoff) vs complex (and underlying confusion is related to ongoing seizures 1. Neuro has consulted 2. Continue 1gm keppra BID 3. Add depakote load  and treatment of 15mg /kg/day 4. EEG in AM 5. Once seizures are stopped, needs PT/OT re-eval 2. H/o Dysphagia - looks like he takes a mechanical soft diet at the SNF at baseline (and was on heart healthy during admit earlier this month) 3. Chronic systolic CHF - continue home meds 4. H/o EtOH abuse and heroin - not believed to have occurred recently in the past week or so (regardless of what patient may say at any given time), given that patient resides in an SNF at the moment  DVT prophylaxis: Pradaxa Code Status: Full Family Communication: No family in  room, Call son for more info / updates Disposition Plan: SNF after admit Consults called: Neuro Admission status: Admit to inpatient   Etta Quill DO [facility] Pager [phone]  If 7AM-7PM, please contact day team taking care of patient [email or web address] Password [email or web address]  05/29/2017, 1:58 AM

## 2017-05-29 NOTE — ED Notes (Signed)
Patient returned from MRI and placed back on monitor

## 2017-05-29 NOTE — Progress Notes (Addendum)
Neurology progress note  Subjective: Patient seen and examined this morning. Occasional left upper extremity jerking. Reports of inability to clear secretions from his throat. Urine drug screen negative.  Objective: Vitals:   05/29/17 0330 05/29/17 0453  BP: (!) 135/92 (!) 144/94  Pulse: 99 (!) 53  Resp: 16 16  Temp:  98.4 F (36.9 C)  SpO2: 93% 93%  Gen. Examination: well developed well-nourished no acute distress HEENT: Normocephalic atraumatic Extremities warm well perfused CVS: S1-S2, RRR Respiratory:clear to auscultation Neurological exam Mental status: Sleepy, wakes up to voice. Oriented to time place and person. Speech is mildly dysarthric, fluent. Naming, attention and repetition intact. Cranial nerves: Pupils equal round reactive to light, visual exam shows left homonymous anopsia, extraocular movements intact with no gaze preference, mild flattening of the left nasolabial fold, normal auditory acuity, palate elevates symmetrically, tongue strength normal. Motor exam: Right upper extremity 5/5, right lower extremity 5/5, left upper extremity with mildly increased tone, occasional jerks seen, 3/5 at the deltoids, biceps and triceps and 4/5 grip strength. Left lower extremity with mildly increased tone,barely antigravity at the hip, 4/5 at the knee and 3/5 at the ankle dorsi and plantar flexion. Sensory exam: Decreased sensation to touch on the left hemibody. Mildly hyperreflexic on the left. Toes downgoing on the right, upgoing on the left. Normal finger-nose-finger on the right, unable to perform on the left. Gait exam was deferred  Labs CBC    Component Value Date/Time   WBC 4.9 05/29/2017 0310   RBC 4.15 (L) 05/29/2017 0310   HGB 12.8 (L) 05/29/2017 0310   HGB 13.4 01/01/2017 1535   HCT 36.3 (L) 05/29/2017 0310   HCT 38.9 01/01/2017 1535   PLT 122 (L) 05/29/2017 0310   PLT 148 (L) 01/01/2017 1535   MCV 87.5 05/29/2017 0310   MCV 94 01/01/2017 1535   MCH 30.8  05/29/2017 0310   MCHC 35.3 05/29/2017 0310   RDW 15.9 (H) 05/29/2017 0310   RDW 14.4 01/01/2017 1535   LYMPHSABS 1.3 05/28/2017 2052   MONOABS 0.6 05/28/2017 2052   EOSABS 0.0 05/28/2017 2052   BASOSABS 0.0 05/28/2017 2052   CMP     Component Value Date/Time   NA 140 05/29/2017 0310   NA 144 01/01/2017 1535   K 3.0 (L) 05/29/2017 0310   CL 106 05/29/2017 0310   CO2 24 05/29/2017 0310   GLUCOSE 86 05/29/2017 0310   BUN 9 05/29/2017 0310   BUN 6 (L) 01/01/2017 1535   CREATININE 1.04 05/29/2017 0310   CREATININE 0.72 12/13/2015 1226   CALCIUM 8.8 (L) 05/29/2017 0310   PROT 6.7 05/29/2017 0310   PROT 7.1 01/01/2017 1535   ALBUMIN 3.0 (L) 05/29/2017 0310   ALBUMIN 2.9 (L) 01/01/2017 1535   AST 35 05/29/2017 0310   ALT 13 (L) 05/29/2017 0310   ALKPHOS 59 05/29/2017 0310   BILITOT 1.5 (H) 05/29/2017 0310   BILITOT 1.2 01/01/2017 1535   GFRNONAA >60 05/29/2017 0310   GFRAA >60 05/29/2017 0310  Urine toxicology negative Urinalysis not suggestive of UTI at this time. Chest x-ray reveals cardiomegaly with vascular congestion.  Medications  Current Facility-Administered Medications:  .  acetaminophen (TYLENOL) tablet 650 mg, 650 mg, Oral, Q6H PRN **OR** acetaminophen (TYLENOL) suppository 650 mg, 650 mg, Rectal, Q6H PRN, Etta Quill, DO .  cholecalciferol (VITAMIN D) tablet 5,000 Units, 5,000 Units, Oral, Daily, Alcario Drought, Jared M, DO .  dabigatran (PRADAXA) capsule 150 mg, 150 mg, Oral, Q12H, Etta Quill, DO .  folic acid (FOLVITE) tablet 1 mg, 1 mg, Oral, Daily, Alcario Drought, Jared M, DO .  lactulose (CHRONULAC) 10 GM/15ML solution 10 g, 10 g, Oral, BID, Alcario Drought, Jared M, DO .  levETIRAcetam (KEPPRA) 1,000 mg in sodium chloride 0.9 % 100 mL IVPB, 1,000 mg, Intravenous, Q12H, Alcario Drought, Jared M, DO .  levothyroxine (SYNTHROID, LEVOTHROID) tablet 50 mcg, 50 mcg, Oral, QAC breakfast, Alcario Drought, Jared M, DO .  magnesium oxide (MAG-OX) tablet 400 mg, 400 mg, Oral, Daily, Alcario Drought,  Jared M, DO .  ondansetron (ZOFRAN) tablet 4 mg, 4 mg, Oral, Q6H PRN **OR** ondansetron (ZOFRAN) injection 4 mg, 4 mg, Intravenous, Q6H PRN, Alcario Drought, Jared M, DO .  thiamine (VITAMIN B-1) tablet 100 mg, 100 mg, Oral, Daily, Alcario Drought, Jared M, DO .  valproate (DEPACON) 500 mg in dextrose 5 % 50 mL IVPB, 500 mg, Intravenous, Q8H, Alcario Drought, Jared M, DO  Assessment Patient is a 76 year old man with a past medical history oflarge right MCA infarct, who was noted to have continuous jerking of his left upper and lower extremity. He was recently evaluated in the hospital in the early part of August and was found to have cardioembolic strokes in the setting of ischemic or a myopathy. His most recent MRI done yesterday shows restricted diffusion which is probably reflecting seizure edema in the right hippocampus and large part of the right cerebral cortex. This is less likely ischemic. 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  Impression Focal seizures with epilepsia partialis continua History of strokes in the past with residual LSW Ischemic or a myopathy-with the last ejection fraction 45-50%. History of substance abuse - most recent Utox negative.  Recommendations -He was loaded with Depakote20 mg/kg on arrival, which seems to have subsided his partial seizures. -Continue with Depakote 500 3 times a day for now. Check Depakote levels in 5 days -If he continues to have seizures despite these 2 medications, we still have room to go up on the [place], which can be increased to 1250 mg twice a day, with a maximum of 1500 twice a day for needed. -We will obtain an EEG -continue aspirin in Pradaxa for stroke prevention We will continue to follow with you Please call with questions  -- Amie Portland, MD [facility] [phone]  If 7pm to 7am, please call on call as listed  on AMION.   Addendum- EEG reviewed. Right cerebral dysfunction and frequent epileptiform activity.  We will go ahead and load the patient with 1 g of Keppra at this time. I will also increase the dose to 1500 mg twice a day.  We will also obtain an EEG tomorrow with the possibility of long-term video monitoring.   Will also consider adding Vimpat as a third line antiepileptic based on clinical course and tomorrow's EEG.  Please call with questions Amie Portland, MD [facility] [phone]  If 7pm to 7am, please call on call as listed on AMION.

## 2017-05-29 NOTE — Progress Notes (Addendum)
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 Widespread gyriform restricted diffusion in the right hemisphere including the right hippocampus. This is most compatible with sequelae of seizure activity. 2. No other acute intracranial abnormality. 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, recently hospitalized in early august --he was changed to pradaxa 150mg  bid per neurology recommendation due to recurrent embolic cva while on apixaban (questionable noncompliance)   Chronic atrial fibrillation -tele reviewed and interpreted daily, in aifb -CHADVASC=5 -on  pradaxa  -In afib/rvr this am, restart home betablocker, keep k>4, mag>2  Hypokalemia/hypomagnesemia K3, mag 1.5 Replace k/mag and repeat lab  in am   Chronic systolic heart failure GL87-56%/EPPIRJJO to severe tricuspid regurgitation -Currently compensated, -close monitor volume status   Ongoing alcohol abuse/history of heroin abuse -he was discharged to SNF this month, not sure if he still have access to these while he is at snf  Hepatitis C/alcohol cirrhosis/hepatic encephalopathy/chronic thrombocytopenia/prior h/o gi bleed --he was referred to infection disease in the past for hep c management, not sure if patient had followed up. Advise patient to follow up with infectious disease (contact info provided) -denies recent gi bleed -continue  lactulose , monitor ammonia level and mental status  Hypothyroidism - on Synthroid , recent TSH 1.3    DVT prophylaxis:pradaxa Code Status:Full Family Communication: patient Disposition Plan:remain in the hospital, eventually return to SNF with neurology clearance Consults called:Neurology   Procedures:  EEG  Antibiotics:  none    Objective: BP (!) 144/94 (BP Location: Left Arm)   Pulse (!) 53   Temp 98.4 F (36.9 C)   Resp 16   Ht 5\' 11"  (1.803 m)   Wt 81.6 kg (180 lb)   SpO2 93%   BMI 25.10 kg/m   Intake/Output Summary (Last 24 hours) at 05/29/17 8416 Last data filed at 05/29/17 6063  Gross per 24 hour  Intake              200 ml  Output                0 ml  Net              200 ml   Filed Weights   05/29/17 0000  Weight: 81.6 kg (180 lb)    Exam: Patient is examined daily including today on 05/29/2017, exams remain the same as of yesterday except that has changed    General:  NAD, Intermittent  left sided twitching, drowsy, speech is slow,  But oriented x3 this am,   Cardiovascular: IRRR  Respiratory: CTABL  Abdomen: Soft/ND/NT, positive BS  Musculoskeletal: left side weakness , left arm weakness, twitching,  No Edema  Neuro: oriented , drowsy, speech is slow  Data Reviewed: Basic Metabolic Panel:  Recent Labs Lab  05/27/17 1409 05/28/17 2052 05/29/17 0310  NA 140 140 140  K 3.8 3.1* 3.0*  CL 108 106 106  CO2 25 25 24   GLUCOSE 97 91 86  BUN 9 10 9   CREATININE 0.90 1.06 1.04  CALCIUM 9.0 8.9 8.8*  MG  --  1.5*  --   PHOS  --  2.6  --    Liver Function Tests:  Recent Labs Lab 05/27/17 1409 05/28/17 2052 05/29/17 0310  AST 38 37 35  ALT 13* 12* 13*  ALKPHOS 65 56 59  BILITOT 0.8 1.3* 1.5*  PROT 7.0 6.8 6.7  ALBUMIN 3.1* 2.9* 3.0*    Recent Labs Lab 05/27/17 1409  LIPASE 27   No results for input(s): AMMONIA in the last 168 hours. CBC:  Recent Labs Lab 05/27/17 1409 05/28/17 2052 05/29/17 0310  WBC 3.4* 4.5 4.9  NEUTROABS 2.1 2.6  --   HGB 13.3 12.1* 12.8*  HCT 39.0 35.1* 36.3*  MCV 89.0 88.4 87.5  PLT 163 146* 122*   Cardiac Enzymes:   No results for input(s): CKTOTAL, CKMB, CKMBINDEX, TROPONINI in the last 168 hours. BNP (last 3 results)  Recent Labs  06/09/16 1500 08/20/16 2041  BNP 463.2* 352.6*    ProBNP (last 3 results) No results for input(s): PROBNP in the last 8760 hours.  CBG: No results for input(s): GLUCAP in the last 168 hours.  Recent Results (from the past 240 hour(s))  Urine culture     Status: None   Collection Time: 05/27/17  4:39 PM  Result Value Ref Range Status   Specimen Description URINE, CLEAN CATCH  Final   Special Requests NONE  Final   Culture   Final    NO GROWTH Performed at [facility], [address] [place], [place] [address]    Report Status 05/29/2017 FINAL  Final     Studies: Ct Head Wo Contrast  Result Date: 05/28/2017 CLINICAL DATA:  Extremity weakness. EXAM: CT HEAD WITHOUT CONTRAST TECHNIQUE: Contiguous axial images were obtained from the base of the skull through the vertex without intravenous contrast. COMPARISON:  CT scan of May 12, 2017. FINDINGS: Brain: Encephalomalacia of right MCA territory is noted consistent with old infarction. Old left posterior parietal infarction is noted as well. No mass  effect or midline shift is noted. Ventricular size is within normal limits. There is no evidence of mass lesion, hemorrhage or acute infarction. Vascular: No hyperdense vessel or unexpected calcification. Skull: Normal. Negative for fracture or focal lesion. Sinuses/Orbits: No acute finding. Other: None. IMPRESSION: Old large right MCA infarction as described above. No acute intracranial abnormality seen. Electronically Signed   By: Marijo Conception, M.D.   On: 05/28/2017 20:38   Mr Brain Wo Contrast  Result Date: 05/29/2017 CLINICAL DATA:  76 year old male with weakness for several days. Left extremity tremor. Recent seizure activity. EXAM: MRI HEAD WITHOUT CONTRAST TECHNIQUE: Multiplanar, multiecho pulse sequences of the brain and surrounding structures were obtained without intravenous contrast. COMPARISON:  Head CT without contrast 05/28/2017. Brain MRI 05/12/2017, and earlier. FINDINGS: Study is intermittently degraded by motion artifact despite repeated imaging attempts. Diagnostic axial FLAIR imaging could not  be obtained. Brain: Gyriform restricted diffusion throughout much of the posterior hemisphere. Involvement of the posterosuperior right frontal lobe, right parietal lobe, and the right temporal lobe including the right hippocampal formation (series 4, image 16). This diffusion abnormality is new compared to 05/12/2017. No contralateral left hemisphere and no posterior fossa restricted diffusion today. Underlying widespread right hemisphere encephalomalacia, primarily throughout the right MCA territory. Significant right temporal lobe involvement. Posterior left MCA encephalomalacia. Associated bilateral hemisphere hemosiderin. No acute hemorrhage identified. Stable cerebral volume. Stable ventricle size and configuration. No midline shift, mass effect, or evidence of intracranial mass lesion. The thalami I, brainstem, and cerebellum appear stable and within normal limits. Vascular: Major intracranial  vascular flow voids are stable. Skull and upper cervical spine: Negative. Normal bone marrow signal. Sinuses/Orbits: Stable and negative. Other: Mastoid air cells remain clear. IMPRESSION: 1. Widespread gyriform restricted diffusion in the right hemisphere including the right hippocampus. This is most compatible with sequelae of seizure activity. 2. No other acute intracranial abnormality. Underlying chronic right greater than left hemispheric encephalomalacia. Electronically Signed   By: Genevie Ann M.D.   On: 05/29/2017 01:41   Dg Chest Portable 1 View  Result Date: 05/28/2017 CLINICAL DATA:  Weakness and new onset seizures. EXAM: PORTABLE CHEST 1 VIEW COMPARISON:  11/12/2016 FINDINGS: 1946 hours. The cardio pericardial silhouette is enlarged. There is pulmonary vascular congestion without overt pulmonary edema. Probable minimal basilar atelectasis. No edema, focal airspace consolidation, or substantial pleural effusion. Bones are diffusely demineralized. Telemetry leads overlie the chest. IMPRESSION: Cardiomegaly with vascular congestion. Electronically Signed   By: Misty Stanley M.D.   On: 05/28/2017 20:06    Scheduled Meds: . cholecalciferol  5,000 Units Oral Daily  . dabigatran  150 mg Oral Q12H  . folic acid  1 mg Oral Daily  . lactulose  10 g Oral BID  . levothyroxine  50 mcg Oral QAC breakfast  . metoprolol tartrate  12.5 mg Oral BID  . potassium chloride  40 mEq Oral Once  . thiamine  100 mg Oral Daily    Continuous Infusions: . levETIRAcetam    . magnesium sulfate 1 - 4 g bolus IVPB    . valproate sodium       Time spent: 6mins from 10am to 10:35am I have personally reviewed and interpreted on  05/29/2017 daily labs, tele strips, imagings as discussed above under date review session and assessment and plans.  I have discussed plan of care as described above with neurology , patient on 05/29/2017   Layann Bluett MD, PhD  [facility] Pager [phone]. If 7PM-7AM, please contact  night-coverage at [email or web address], password [facility] 05/29/2017, 9:03 AM  LOS: 0 days

## 2017-05-30 ENCOUNTER — Inpatient Hospital Stay (HOSPITAL_COMMUNITY): Payer: Medicare Other

## 2017-05-30 DIAGNOSIS — I482 Chronic atrial fibrillation: Secondary | ICD-10-CM

## 2017-05-30 DIAGNOSIS — K7030 Alcoholic cirrhosis of liver without ascites: Secondary | ICD-10-CM

## 2017-05-30 LAB — CBC
HEMATOCRIT: 31.9 % — AB (ref 39.0–52.0)
HEMOGLOBIN: 11.2 g/dL — AB (ref 13.0–17.0)
MCH: 30.8 pg (ref 26.0–34.0)
MCHC: 35.1 g/dL (ref 30.0–36.0)
MCV: 87.6 fL (ref 78.0–100.0)
PLATELETS: 122 10*3/uL — AB (ref 150–400)
RBC: 3.64 MIL/uL — AB (ref 4.22–5.81)
RDW: 15.8 % — AB (ref 11.5–15.5)
WBC: 2.5 10*3/uL — AB (ref 4.0–10.5)

## 2017-05-30 LAB — VALPROIC ACID LEVEL: VALPROIC ACID LVL: 87 ug/mL (ref 50.0–100.0)

## 2017-05-30 LAB — HEPATIC FUNCTION PANEL
ALBUMIN: 2.6 g/dL — AB (ref 3.5–5.0)
ALK PHOS: 49 U/L (ref 38–126)
ALT: 11 U/L — AB (ref 17–63)
AST: 32 U/L (ref 15–41)
BILIRUBIN DIRECT: 0.3 mg/dL (ref 0.1–0.5)
BILIRUBIN TOTAL: 1.3 mg/dL — AB (ref 0.3–1.2)
Indirect Bilirubin: 1 mg/dL — ABNORMAL HIGH (ref 0.3–0.9)
Total Protein: 6.2 g/dL — ABNORMAL LOW (ref 6.5–8.1)

## 2017-05-30 LAB — RPR: RPR: NONREACTIVE

## 2017-05-30 LAB — COMPREHENSIVE METABOLIC PANEL
ALBUMIN: 2.4 g/dL — AB (ref 3.5–5.0)
ALT: 10 U/L — ABNORMAL LOW (ref 17–63)
ANION GAP: 9 (ref 5–15)
AST: 26 U/L (ref 15–41)
Alkaline Phosphatase: 47 U/L (ref 38–126)
BUN: 5 mg/dL — AB (ref 6–20)
CHLORIDE: 106 mmol/L (ref 101–111)
CO2: 24 mmol/L (ref 22–32)
Calcium: 8.4 mg/dL — ABNORMAL LOW (ref 8.9–10.3)
Creatinine, Ser: 0.91 mg/dL (ref 0.61–1.24)
GFR calc Af Amer: >60 mL/min (ref >60)
Glucose, Bld: 82 mg/dL (ref 65–99)
POTASSIUM: 2.4 mmol/L — AB (ref 3.5–5.1)
Sodium: 139 mmol/L (ref 135–145)
Total Bilirubin: 1.3 mg/dL — ABNORMAL HIGH (ref 0.3–1.2)
Total Protein: 6.1 g/dL — ABNORMAL LOW (ref 6.5–8.1)

## 2017-05-30 LAB — HIV ANTIBODY (ROUTINE TESTING W REFLEX): HIV SCREEN 4TH GENERATION: NONREACTIVE

## 2017-05-30 LAB — MAGNESIUM: MAGNESIUM: 1.9 mg/dL (ref 1.7–2.4)

## 2017-05-30 LAB — AMMONIA: AMMONIA: 56 umol/L — AB (ref 9–35)

## 2017-05-30 MED ORDER — LORAZEPAM 2 MG/ML IJ SOLN
INTRAMUSCULAR | Status: AC
  Filled 2017-05-30: qty 1

## 2017-05-30 MED ORDER — CLONAZEPAM 0.5 MG PO TABS
0.5000 mg | ORAL_TABLET | Freq: Three times a day (TID) | ORAL | Status: DC
  Administered 2017-05-31: 0.5 mg via ORAL
  Filled 2017-05-30: qty 1

## 2017-05-30 MED ORDER — LORAZEPAM 2 MG/ML IJ SOLN
2.0000 mg | Freq: Once | INTRAMUSCULAR | Status: AC
  Administered 2017-05-30: 2 mg via INTRAVENOUS

## 2017-05-30 MED ORDER — VALPROATE SODIUM 500 MG/5ML IV SOLN
750.0000 mg | Freq: Three times a day (TID) | INTRAVENOUS | Status: DC
  Administered 2017-05-31 – 2017-06-01 (×3): 750 mg via INTRAVENOUS
  Filled 2017-05-30 (×6): qty 7.5

## 2017-05-30 MED ORDER — POTASSIUM CHLORIDE CRYS ER 20 MEQ PO TBCR
40.0000 meq | EXTENDED_RELEASE_TABLET | ORAL | Status: AC
  Administered 2017-05-30 (×3): 40 meq via ORAL
  Filled 2017-05-30 (×3): qty 2

## 2017-05-30 MED ORDER — MAGNESIUM SULFATE IN D5W 1-5 GM/100ML-% IV SOLN
1.0000 g | Freq: Once | INTRAVENOUS | Status: AC
  Administered 2017-05-30: 1 g via INTRAVENOUS
  Filled 2017-05-30: qty 100

## 2017-05-30 MED ORDER — LORAZEPAM 2 MG/ML IJ SOLN
INTRAMUSCULAR | Status: AC
  Administered 2017-05-30: 2 mg
  Filled 2017-05-30: qty 1

## 2017-05-30 NOTE — Progress Notes (Signed)
Nurse noticed twitching is left side of face. Neurology notified. First occurrence lasting two minutes. Second occurrence lasting >5. MD came to bedside to see patient. Patient fully oriented and able to follow commands/ answer questions during events.

## 2017-05-30 NOTE — Progress Notes (Signed)
During change of shift report, nurses at bedside noticed facial twitching on left side of face. Patient fully oriented and able to answer questions which twitching. Neurology notified and orders given for 2 mg ativan, lab work, and medication adjustment. Ativan given and lab called to bedside to draw levels.

## 2017-05-30 NOTE — Progress Notes (Signed)
Page sent to Dr. Cheral Marker updating him on patient status and reporting stat Valproic Acid level of 87.  RN informed MD in page that patient is sleeping, VSS, no twitching present after 4 mg Ativan given, VSS. RN will continue to monitor.  P.J. Linus Mako, RN

## 2017-05-30 NOTE — Progress Notes (Signed)
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 Widespread gyriform restricted diffusion in the right hemisphere including the right hippocampus. This is most compatible with sequelae of seizure activity. 2. No other acute intracranial abnormality. Underlying chronic right greater than left hemispheric encephalomalacia." --he is started on Keppra 1000 IV every 12 and depacon 500mg  q8hrs,  keppra dose increase to 1500 bid on 8/25 --Seizure precautions,  --neurology input appreciated, less twitching, remain drowsy, repeat eeg and further meds adjustment per neurology.  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, recently hospitalized in early august --he was changed to pradaxa 150mg  bid per neurology recommendation due to recurrent embolic cva while on apixaban (questionable noncompliance while on apixaban)   Chronic atrial fibrillation -tele reviewed and interpreted daily, in  aifb -CHADVASC=5 -on  pradaxa  -In afib/rvr on 8/25, now rate controlled - keep k>4, mag>2  Hypokalemia/hypomagnesemia Mag improved, k remain low at 2.4, continue supplement repeat lab in am   Chronic systolic heart failure JG28-36%/OQHUTMLY to severe tricuspid regurgitation -Currently compensated, -close monitor volume status   Ongoing alcohol abuse/history of heroin abuse -he was discharged to SNF this month, not sure if he still have access to these while he is at snf  Hepatitis C/alcohol cirrhosis/hepatic encephalopathy/chronic thrombocytopenia/prior h/o gi bleed --he was referred to infection disease in the past for hep c management, not sure if patient had followed up. Advise patient to follow up with infectious disease (contact info provided) -denies recent gi bleed -continue  lactulose , monitor ammonia level and mental status  Hypothyroidism - on Synthroid , recent TSH 1.3  start PT eval, swallow eval and cognitive eval by speech  DVT prophylaxis:pradaxa Code Status:Full Family Communication: patient Disposition Plan:remain in the hospital, eventually return to SNF with neurology clearance Consults called:Neurology   Procedures:  EEG  Antibiotics:  none    Objective: BP 128/81 (BP Location: Left Arm)   Pulse 82   Temp 97.6 F (36.4 C) (Oral)   Resp 16   Ht 5\' 11"  (1.803 m)   Wt 81.6 kg (180 lb)   SpO2 99%   BMI 25.10 kg/m   Intake/Output Summary (Last 24 hours) at 05/30/17 0813 Last data filed at 05/30/17 0552  Gross per 24 hour  Intake              390 ml  Output              950 ml  Net             -560 ml   Filed Weights   05/29/17 0000  Weight: 81.6 kg (  180 lb)    Exam: Patient is examined daily including today on 05/30/2017, exams remain the same as of yesterday except that has changed    General:  NAD, less Intermittent left sided twitching, remain drowsy, speech is slow,  But oriented x3 this am,    Cardiovascular: IRRR  Respiratory: CTABL  Abdomen: Soft/ND/NT, positive BS  Musculoskeletal: left side weakness , not able to lift left arm or left leg against gravity, no twitching this am,  No Edema  Neuro: oriented , drowsy, speech is slow  Data Reviewed: Basic Metabolic Panel:  Recent Labs Lab 05/27/17 1409 05/28/17 2052 05/29/17 0310  NA 140 140 140  K 3.8 3.1* 3.0*  CL 108 106 106  CO2 25 25 24   GLUCOSE 97 91 86  BUN 9 10 9   CREATININE 0.90 1.06 1.04  CALCIUM 9.0 8.9 8.8*  MG  --  1.5*  --   PHOS  --  2.6  --    Liver Function Tests:  Recent Labs Lab 05/27/17 1409 05/28/17 2052 05/29/17 0310  AST 38 37 35  ALT 13* 12* 13*  ALKPHOS 65 56 59  BILITOT 0.8 1.3* 1.5*  PROT 7.0 6.8 6.7  ALBUMIN 3.1* 2.9* 3.0*    Recent Labs Lab 05/27/17 1409  LIPASE 27    Recent Labs Lab 05/30/17 0654  AMMONIA 56*   CBC:  Recent Labs Lab 05/27/17 1409 05/28/17 2052 05/29/17 0310 05/30/17 0654  WBC 3.4* 4.5 4.9 2.5*  NEUTROABS 2.1 2.6  --   --   HGB 13.3 12.1* 12.8* 11.2*  HCT 39.0 35.1* 36.3* 31.9*  MCV 89.0 88.4 87.5 87.6  PLT 163 146* 122* 122*   Cardiac Enzymes:   No results for input(s): CKTOTAL, CKMB, CKMBINDEX, TROPONINI in the last 168 hours. BNP (last 3 results)  Recent Labs  06/09/16 1500 08/20/16 2041  BNP 463.2* 352.6*    ProBNP (last 3 results) No results for input(s): PROBNP in the last 8760 hours.  CBG: No results for input(s): GLUCAP in the last 168 hours.  Recent Results (from the past 240 hour(s))  Urine culture     Status: None   Collection Time: 05/27/17  4:39 PM  Result Value Ref Range Status   Specimen Description URINE, CLEAN CATCH  Final   Special Requests NONE  Final   Culture   Final    NO GROWTH Performed at [facility], [address], [place], [place] [address]    Report Status 05/29/2017 FINAL  Final  MRSA PCR Screening     Status: None   Collection Time: 05/29/17  5:21 AM  Result Value Ref  Range Status   MRSA by PCR NEGATIVE NEGATIVE Final    Comment:        The GeneXpert MRSA Assay (FDA approved for NASAL specimens only), is one component of a comprehensive MRSA colonization surveillance program. It is not intended to diagnose MRSA infection nor to guide or monitor treatment for MRSA infections.      Studies: No results found.  Scheduled Meds: . cholecalciferol  5,000 Units Oral Daily  . dabigatran  150 mg Oral Q12H  . folic acid  1 mg Oral Daily  . lactulose  10 g Oral BID  . levothyroxine  50 mcg Oral QAC breakfast  . metoprolol tartrate  12.5 mg Oral BID  . thiamine  100 mg Oral Daily    Continuous Infusions: . levETIRAcetam Stopped (05/29/17 2238)  . valproate sodium Stopped (05/30/17 0153)  Time spent: 80mins  I have personally reviewed and interpreted on  05/30/2017 daily labs, tele strips, imagings as discussed above under date review session and assessment and plans.  I have discussed plan of care as described above with neurology , patient on 05/30/2017   Breven Guidroz MD, PhD  [facility] Pager [phone]. If 7PM-7AM, please contact night-coverage at [email or web address], password [facility] 05/30/2017, 8:13 AM  LOS: 1 day

## 2017-05-30 NOTE — Progress Notes (Signed)
Neurology progress note  Subjective: Patient seen and examined Reports significantly reduced left arm jerking. Does not remember if he continued to have any jerking overnight when he was asleep.  Objective Vitals:   05/30/17 0537 05/30/17 0838  BP: 128/81 131/84  Pulse: 82 93  Resp: 16   Temp: 97.6 F (36.4 C)   SpO2: 99%   Gen.: No apparent distress HEENT: Normocephalic and atraumatic neck 7 neck 70s: Warm and well-perfused CVS: S1-S2 heard, regular rate rhythm Respiratory: Clear to auscultation Neurological exam Was sleeping in bed, easily awakened by voice, oriented to place person and time. Speech is mildly dysarthric but he can name, repeat and comprehend appropriately. Cranial nerves: Pupils equal round reactive to light, visual field exam shows left homonymous hemianopsia, extra ocular movements intact with no gaze preference, mild lower left face weakness, normal auditory acuity, palate elevates symmetrically and tongue strength is normal. Motor exam: Right upper extremity 5/5, right lower extremity 5/5, left upper extremity with mildly increased tone, no jerking seen, 3/5 at the deltoids biceps and triceps and 4/5 grip strength. Left lower extremity has mildly increased tone and is antigravity at the hip barely, is 4/5 at the knee and 3/5 at the ankle dorsi and plantar flexion. Sensory exam: Decreased sensation on the left hemibody DTRs: Mildly hyperreflexic on the left Toes are downgoing on the right upgoing on the left. Normal finger-nose-finger on the right, unable to perform on the left. Gait exam was deferred  CBC    Component Value Date/Time   WBC 2.5 (L) 05/30/2017 0654   RBC 3.64 (L) 05/30/2017 0654   HGB 11.2 (L) 05/30/2017 0654   HGB 13.4 01/01/2017 1535   HCT 31.9 (L) 05/30/2017 0654   HCT 38.9 01/01/2017 1535   PLT 122 (L) 05/30/2017 0654   PLT 148 (L) 01/01/2017 1535   MCV 87.6 05/30/2017 0654   MCV 94 01/01/2017 1535   MCH 30.8 05/30/2017 0654   MCHC 35.1 05/30/2017 0654   RDW 15.8 (H) 05/30/2017 0654   RDW 14.4 01/01/2017 1535   LYMPHSABS 1.3 05/28/2017 2052   MONOABS 0.6 05/28/2017 2052   EOSABS 0.0 05/28/2017 2052   BASOSABS 0.0 05/28/2017 2052    CMP     Component Value Date/Time   NA 139 05/30/2017 0654   NA 144 01/01/2017 1535   K 2.4 (LL) 05/30/2017 0654   CL 106 05/30/2017 0654   CO2 24 05/30/2017 0654   GLUCOSE 82 05/30/2017 0654   BUN 5 (L) 05/30/2017 0654   BUN 6 (L) 01/01/2017 1535   CREATININE 0.91 05/30/2017 0654   CREATININE 0.72 12/13/2015 1226   CALCIUM 8.4 (L) 05/30/2017 0654   PROT 6.1 (L) 05/30/2017 0654   PROT 7.1 01/01/2017 1535   ALBUMIN 2.4 (L) 05/30/2017 0654   ALBUMIN 2.9 (L) 01/01/2017 1535   AST 26 05/30/2017 0654   ALT 10 (L) 05/30/2017 0654   ALKPHOS 47 05/30/2017 0654   BILITOT 1.3 (H) 05/30/2017 0654   BILITOT 1.2 01/01/2017 1535   GFRNONAA >60 05/30/2017 0654   GFRAA >60 05/30/2017 0654   MRI of the brain that was done on arrival showed widespread gyriform restricted diffusion in the right hemisphere including the right hippocampus, suggestive of possible underlying seizure activity. No acute stroke identified.  Assessment 76 year old man with a past medical history of large right MCA stroke, who was brought from his nursing facility for continuous jerking of his left upper and lower extremity. He was seen in August in the  hospital, found to have cardiac embolic strokes in the setting of ischemic cardiomyopathy. His most recent MRI done on admission 2 days ago showed gyriform cortical restricted diffusion probably reflecting seizure edema and also restricted diffusion in the right hippocampus, again suggesting underlying seizure activity and less likely to be ischemic.  Impression Focal seizures with epilepsia partialis continua History of strokes in the past with residual left-sided weakness Ischemic cardiomyopathy History of substance abuse in the past, U tox negative on this  admission.   Recommendations Continue on the current dose of Keppra-1500 twice a day. Check Depakote level in 5 days. Continue with the same dose of Depakote-500 mg 3 times a day. Please call neurology if he continues to have seizures. I have ordered a repeat EEG to assess the underlying electrographic activity. If he continues to have seizures, I would consider either increasing the Depakote, or adding a third agent at this time. Continue. Pradaxa for stroke prevention We will continue to follow him with you.  Amie Portland, MD [facility] [phone]  If 7pm to 7am, please call on call as listed on AMION.

## 2017-05-30 NOTE — Progress Notes (Addendum)
Called to see patient for Left had/arm shaking.  Nurse reported that he had multiple episodes of left arm/hand jerking lasting from less than a minute to upwards to 4 minutes, that spontaneously subsided. He continued to be conversant during these events.  I saw and evaluated the pt.  Exam unchanged from before. I did not witness the jerking movements.  I have made the following changes/recs:  -Increase Valproate to 750 TID -Check level in AM. Call neurology if sz lasts >57min.  Amie Portland, MD [facility] [phone]  If 7pm to 7am, please call on call as listed on AMION.

## 2017-05-30 NOTE — Discharge Instructions (Signed)
Information on my medicine - Pradaxa (dabigatran)  This medication education was reviewed with me or my healthcare representative as part of my discharge preparation.  The pharmacist that spoke with me during my hospital stay was:  Orma Render, [place]  Why was Pradaxa prescribed for you? Pradaxa was prescribed for you to reduce the risk of forming blood clots that cause a stroke if you have a medical condition called atrial fibrillation (a type of irregular heartbeat).    What do you Need to know about PradAXa? Take your Pradaxa TWICE DAILY - one capsule in the morning and one tablet in the evening with or without food.  It would be best to take the doses about the same time each day.  The capsules should not be broken, chewed or opened - they must be swallowed whole.  Do not store Pradaxa in other medication containers - once the bottle is opened the Pradaxa should be used within FOUR months; throw away any capsules that havent been by that time.  Take Pradaxa exactly as prescribed by your doctor.  DO NOT stop taking Pradaxa without talking to the doctor who prescribed the medication.  Stopping without other stroke prevention medication to take the place of Pradaxa may increase your risk of developing a clot that causes a stroke.  Refill your prescription before you run out.  After discharge, you should have regular check-up appointments with your healthcare provider that is prescribing your Pradaxa.  In the future your dose may need to be changed if your kidney function or weight changes by a significant amount.  What do you do if you miss a dose? If you miss a dose, take it as soon as you remember on the same day.  If your next dose is less than 6 hours away, skip the missed dose.  Do not take two doses of PRADAXA at the same time.  Important Safety Information A possible side effect of Pradaxa is bleeding. You should call your healthcare provider right away if you experience any  of the following: ? Bleeding from an injury or your nose that does not stop. ? Unusual colored urine (red or dark brown) or unusual colored stools (red or black). ? Unusual bruising for unknown reasons. ? A serious fall or if you hit your head (even if there is no bleeding).  Some medicines may interact with Pradaxa and might increase your risk of bleeding or clotting while on Pradaxa. To help avoid this, consult your healthcare provider or pharmacist prior to using any new prescription or non-prescription medications, including herbals, vitamins, non-steroidal anti-inflammatory drugs (NSAIDs) and supplements.  This website has more information on Pradaxa (dabigatran): [email or web address]://www.pradaxa.com

## 2017-05-30 NOTE — Progress Notes (Signed)
S/O: Left upper extremity jerking recurred at about 8:00 PM. 2 mg IV Ativan given with significant improvement but not total cessation. An additional 2 mg IV Ativan was administered with complete cessation. Patient currently sleeping.   A/R: 1. Given therapeutic effect of Ativan, a trial of Klonopin is being started at 0.5 mg TID, first dose at 1000.  2. Continue VPA at 750 mg TID. Level drawn this evening was 87.  3. Continue Keppra at 1500 mg BID. 4. Repeat EEG in AM.   Electronically signed: Dr. Kerney Elbe

## 2017-05-30 NOTE — Progress Notes (Signed)
Bedside EEG completed, results pending. 

## 2017-05-31 ENCOUNTER — Inpatient Hospital Stay (HOSPITAL_COMMUNITY): Payer: Medicare Other

## 2017-05-31 DIAGNOSIS — G40211 Localization-related (focal) (partial) symptomatic epilepsy and epileptic syndromes with complex partial seizures, intractable, with status epilepticus: Secondary | ICD-10-CM

## 2017-05-31 DIAGNOSIS — K7200 Acute and subacute hepatic failure without coma: Secondary | ICD-10-CM

## 2017-05-31 DIAGNOSIS — G9340 Encephalopathy, unspecified: Secondary | ICD-10-CM

## 2017-05-31 DIAGNOSIS — R569 Unspecified convulsions: Secondary | ICD-10-CM

## 2017-05-31 DIAGNOSIS — J9601 Acute respiratory failure with hypoxia: Secondary | ICD-10-CM

## 2017-05-31 LAB — HEPATIC FUNCTION PANEL
ALK PHOS: 48 U/L (ref 38–126)
ALT: 10 U/L — ABNORMAL LOW (ref 17–63)
AST: 29 U/L (ref 15–41)
Albumin: 2.4 g/dL — ABNORMAL LOW (ref 3.5–5.0)
BILIRUBIN DIRECT: 0.4 mg/dL (ref 0.1–0.5)
BILIRUBIN INDIRECT: 0.8 mg/dL (ref 0.3–0.9)
TOTAL PROTEIN: 5.7 g/dL — AB (ref 6.5–8.1)
Total Bilirubin: 1.2 mg/dL (ref 0.3–1.2)

## 2017-05-31 LAB — CBC
HCT: 34.7 % — ABNORMAL LOW (ref 39.0–52.0)
Hemoglobin: 12.4 g/dL — ABNORMAL LOW (ref 13.0–17.0)
MCH: 31.6 pg (ref 26.0–34.0)
MCHC: 35.7 g/dL (ref 30.0–36.0)
MCV: 88.3 fL (ref 78.0–100.0)
PLATELETS: 112 10*3/uL — AB (ref 150–400)
RBC: 3.93 MIL/uL — ABNORMAL LOW (ref 4.22–5.81)
RDW: 15.9 % — AB (ref 11.5–15.5)
WBC: 2.4 10*3/uL — AB (ref 4.0–10.5)

## 2017-05-31 LAB — BASIC METABOLIC PANEL
ANION GAP: 7 (ref 5–15)
BUN: <5 mg/dL — ABNORMAL LOW (ref 6–20)
CALCIUM: 8.3 mg/dL — AB (ref 8.9–10.3)
CO2: 23 mmol/L (ref 22–32)
CREATININE: 0.84 mg/dL (ref 0.61–1.24)
Chloride: 111 mmol/L (ref 101–111)
GLUCOSE: 79 mg/dL (ref 65–99)
Potassium: 3.6 mmol/L (ref 3.5–5.1)
Sodium: 141 mmol/L (ref 135–145)

## 2017-05-31 LAB — VALPROIC ACID LEVEL: VALPROIC ACID LVL: 94 ug/mL (ref 50.0–100.0)

## 2017-05-31 LAB — MAGNESIUM: Magnesium: 2 mg/dL (ref 1.7–2.4)

## 2017-05-31 LAB — AMMONIA
AMMONIA: 97 umol/L — AB (ref 9–35)
Ammonia: 56 umol/L — ABNORMAL HIGH (ref 9–35)

## 2017-05-31 MED ORDER — SODIUM CHLORIDE 0.9 % IV SOLN
200.0000 mg | Freq: Two times a day (BID) | INTRAVENOUS | Status: DC
  Administered 2017-05-31 – 2017-06-08 (×18): 200 mg via INTRAVENOUS
  Filled 2017-05-31 (×30): qty 20

## 2017-05-31 MED ORDER — METOPROLOL TARTRATE 5 MG/5ML IV SOLN
2.5000 mg | Freq: Four times a day (QID) | INTRAVENOUS | Status: DC
  Administered 2017-05-31: 2.5 mg via INTRAVENOUS
  Filled 2017-05-31: qty 5

## 2017-05-31 MED ORDER — ENOXAPARIN SODIUM 80 MG/0.8ML ~~LOC~~ SOLN
1.0000 mg/kg | Freq: Two times a day (BID) | SUBCUTANEOUS | Status: DC
  Administered 2017-05-31 – 2017-06-08 (×17): 80 mg via SUBCUTANEOUS
  Filled 2017-05-31 (×17): qty 0.8

## 2017-05-31 MED ORDER — LACTULOSE ENEMA
300.0000 mL | Freq: Two times a day (BID) | ORAL | Status: DC
  Administered 2017-05-31: 300 mL via RECTAL
  Filled 2017-05-31 (×2): qty 300

## 2017-05-31 MED ORDER — SODIUM CHLORIDE 0.9 % IV SOLN
100.0000 mg | Freq: Once | INTRAVENOUS | Status: AC
  Administered 2017-05-31: 100 mg via INTRAVENOUS
  Filled 2017-05-31: qty 10

## 2017-05-31 MED ORDER — ENOXAPARIN SODIUM 80 MG/0.8ML ~~LOC~~ SOLN: 1.0000 mg/kg | Freq: Two times a day (BID) | SUBCUTANEOUS | Status: DC

## 2017-05-31 MED ORDER — METOPROLOL TARTRATE 5 MG/5ML IV SOLN
5.0000 mg | Freq: Four times a day (QID) | INTRAVENOUS | Status: DC
  Administered 2017-05-31 – 2017-06-01 (×3): 5 mg via INTRAVENOUS
  Filled 2017-05-31 (×3): qty 5

## 2017-05-31 NOTE — Progress Notes (Signed)
Reported back to the 5W Unit for flow up.The patient's Nurse reports he is unable to complete the AD at this time based on his medical status.  Chaplain Yaakov Guthrie  [phone]

## 2017-05-31 NOTE — Procedures (Signed)
ELECTROENCEPHALOGRAM REPORT  Date of Study: 05/30/2017 4:08PM (assigned to this provider on 05/31/2017)  Patient's Name: Nathan Frank MRN: [id number] Date of Birth: [birth year]  Referring Provider: Dr. 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  Medications: valproate (DEPACON) 500 mg in dextrose 5 % 50 mL IVPB  levETIRAcetam (KEPPRA) 1,500 mg in sodium chloride 0.9 % 100 mL IVPB  acetaminophen (TYLENOL) tablet 650 mg  cholecalciferol (VITAMIN D) tablet 5,000 Units  dabigatran (PRADAXA) capsule 401 mg  folic acid (FOLVITE) tablet 1 mg  lactulose (CHRONULAC) 10 GM/15ML solution 10 g  levothyroxine (SYNTHROID, LEVOTHROID) tablet 50 mcg  metoprolol tartrate (LOPRESSOR) tablet 12.5 mg  potassium chloride SA (K-DUR,KLOR-CON) CR tablet 40 mEq  thiamine (VITAMIN B-1) tablet 100 mg   Technical Summary: A multichannel digital EEG recording measured by the international 10-20 system with electrodes applied with paste and impedances below 5000 ohms performed as portable with EKG monitoring in an awake patient.  Hyperventilation and photic stimulation were not performed.  The digital EEG was referentially recorded, reformatted, and digitally filtered in a variety of bipolar and referential montages for optimal display.   Description: The patient is awake during the recording.  During maximal wakefulness, there is a symmetric, medium voltage 7 Hz posterior dominant rhythm that attenuates with eye opening. The background is asymmetric with 4-5 Hz theta and 2-3 Hz delta slowing over the left hemisphere and suppression over the right hemisphere. Sleep was not captured. Throughout the recording, there are periodic lateralized discharges occurring at a frequency of 1-2/sec over the right occipital region with occasional spread to the left occipital region. There are 2 instances where these evolve in frequency and amplitude  lasting 40-50 seconds. The patient is noted to have subtle head jerking and left hand jerking on and off throughout the study.   EKG lead showed irregular rhythm.  Impression: This awake EEG is abnormal due to the presence: 1. Suppression over the right hemisphere 2. Slowing over the left hemisphere 3. Periodic lateralized discharges over the right occipital region, at times associated with head and left arm jerking 4. Two electrographic seizures arising from the right occipital region lasting 40-50 seconds  Clinical Correlation of the above findings indicates bilateral cerebral dysfunction. Lateralized periodic discharges are commonly associated with an acute brain lesion and clinical focal seizures, or post-ictal after focal status epilepticus. There were 2 electrographic seizures captured in this study. Clinical correlation is advised.   Ellouise Newer, M.D.

## 2017-05-31 NOTE — Progress Notes (Signed)
Call received from Dr. Cheral Marker making RN aware he is odering Clonazepam for patient for seizure control. RN alerted Dr. Cheral Marker that patient is too sleepy to safely swallow medications at this time and Dr. Cheral Marker stated that Clonazepam will start tomorrow morning, but encouraged RN to give HS medications as soon as patient able to safely swallow. RN will give medications to patient once patient able to swallow safely.  P.J. Linus Mako, RN

## 2017-05-31 NOTE — Progress Notes (Signed)
RT at bedside.  Pt BBS clear.  Assessed pt for NTS.. Pt does not need at this time.  Suctioned back of throat clear sputum obtained.  Pt resting comfortably no audible gurgling.

## 2017-05-31 NOTE — Progress Notes (Signed)
Chaplain presented to the patient's hope to provide documentation for an Advance Directive. The patient was extremely sleepy at the time of this visit, and was unable to complete AD. Chaplain spoke to the assigned Nurse and informed her of the same, will follow up at a later time. Chaplain Yaakov Guthrie [phone]

## 2017-05-31 NOTE — Progress Notes (Signed)
SLP Cancellation Note  Patient Details Name: Nathan Frank MRN: [id number] DOB: 1941-04-15   Cancelled treatment:       Reason Eval/Treat Not Completed: Medical issues which prohibited therapy  Gabriel Rainwater [place], [place] M. [place] [phone]  Gabriel Rainwater [place] 05/31/2017, 4:29 PM

## 2017-05-31 NOTE — Progress Notes (Signed)
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)   History of CVA (cerebrovascular accident)   History of alcohol abuse   Seizures ([place])   Acute encephalopathy  Recurrent Partial seizure disorder -He was diagnosed with New onset partial seizure disorder during recent hospitalization early this month, thought recurrent embolic cva cause seizure onset --he was sent back to the hospital with witnessed recurrent left-sided seizure activity -EEGsuggestive of temporal lobe etiology to patient's seizure disorder during last hospitalization, repeat eeg -mri this hospitalization " 1. Widespread gyriform restricted diffusion in the right hemisphere including the right hippocampus. This is most compatible with sequelae of seizure activity. 2. No other acute intracranial abnormality. Underlying chronic right greater than left hemispheric encephalomalacia." --he is started on Keppra 1000 IV every 12 and depacon 500mg  q8hrs,  keppra dose increase to 1500 bid on 8/25 --Seizure precautions,he continue to have intermittent twitching,  he is started on third agent for seizure control with vimpat per neurology on 8/27 --mental status worsening, worried that he might need intubation for airway protection, will get cxr, keep npo, change all meds to iv, critical care consulted Worsening of mental status could be multifactorial including seizure, secondary to ativan ( he received 4mg  iv ativan on  826/27 night), hepatic encephalopathy.  --Case discussed with critical care and neurology , input appreciated.  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, recently hospitalized in early august --he was changed to pradaxa 150mg  bid per neurology recommendation due to recurrent embolic cva while on apixaban (questionable noncompliance while on apixaban)   Chronic atrial fibrillation -tele reviewed and interpreted daily, in aifb -CHADVASC=5 -on  pradaxa  -In afib/rvr on 8/25, now rate controlled - keep k>4, mag>2  Hypokalemia/hypomagnesemia Mag improved, k remain low at 2.4, continue supplement repeat lab in am   Chronic systolic heart failure PY19-50%/DTOIZTIW to severe tricuspid regurgitation -Currently compensated, -close monitor volume status   Ongoing alcohol abuse/history of heroin abuse -he was discharged to SNF this month, not sure if he still have access to these while he is at snf  Hepatitis C/alcohol cirrhosis/hepatic encephalopathy/chronic thrombocytopenia/prior h/o gi bleed --he was referred to infection disease in the past for hep c management, not sure if patient had followed up. Advise patient to follow up with infectious disease (contact info provided) -denies recent gi bleed - mental status worsening, not safe to take oral meds, change lactulose to per rectal  Hypothyroidism - on Synthroid , recent TSH 1.3   PT eval, swallow eval and cognitive eval by speech held due to condition deteriorate.   DVT prophylaxis:pradaxa Code Status:Full Family Communication: patient Disposition Plan:remain in the hospital, eventually return to SNF with neurology clearance Consults called:Neurology   Procedures:  EEG  Antibiotics:  none    Objective: BP (!) 143/97 (BP Location: Left Arm)   Pulse 71   Temp 98.2 F (36.8 C) (Oral)   Resp 16   Ht 5\' 11"  (1.803 m)   Wt 81.6 kg (180 lb)  SpO2 98%   BMI 25.10 kg/m   Intake/Output Summary (Last 24 hours) at 05/31/17 1611 Last data filed at 05/31/17 0138  Gross per 24 hour  Intake            172.5 ml  Output              800 ml  Net           -627.5 ml   Filed Weights   05/29/17 0000  Weight: 81.6 kg (180 lb)    Exam: Patient is examined daily including today on 05/31/2017, exams remain the same as of yesterday except that has changed    General:   Mental status got worse, he is very lethargic this am, gurgling, not able to effectively handle his secretion  Cardiovascular: IRRR  Respiratory: CTABL  Abdomen: Soft/ND/NT, positive BS  Musculoskeletal: left side weakness , not able to lift left arm or left leg against gravity, intermittent twitching this am,  No Edema  Neuro: Mental status got worse, he is very lethargic this am, gurgling  Data Reviewed: Basic Metabolic Panel:  Recent Labs Lab 05/27/17 1409 05/28/17 2052 05/29/17 0310 05/30/17 0654 05/31/17 0521  NA 140 140 140 139 141  K 3.8 3.1* 3.0* 2.4* 3.6  CL 108 106 106 106 111  CO2 25 25 24 24 23   GLUCOSE 97 91 86 82 79  BUN 9 10 9  5* <5*  CREATININE 0.90 1.06 1.04 0.91 0.84  CALCIUM 9.0 8.9 8.8* 8.4* 8.3*  MG  --  1.5*  --  1.9 2.0  PHOS  --  2.6  --   --   --    Liver Function Tests:  Recent Labs Lab 05/28/17 2052 05/29/17 0310 05/30/17 0654 05/30/17 0956 05/31/17 0521  AST 37 35 26 32 29  ALT 12* 13* 10* 11* 10*  ALKPHOS 56 59 47 49 48  BILITOT 1.3* 1.5* 1.3* 1.3* 1.2  PROT 6.8 6.7 6.1* 6.2* 5.7*  ALBUMIN 2.9* 3.0* 2.4* 2.6* 2.4*    Recent Labs Lab 05/27/17 1409  LIPASE 27    Recent Labs Lab 05/30/17 0654 05/31/17 0521  AMMONIA 56* 56*   CBC:  Recent Labs Lab 05/27/17 1409 05/28/17 2052 05/29/17 0310 05/30/17 0654 05/31/17 0521  WBC 3.4* 4.5 4.9 2.5* 2.4*  NEUTROABS 2.1 2.6  --   --   --   HGB 13.3 12.1* 12.8* 11.2* 12.4*  HCT 39.0 35.1* 36.3* 31.9* 34.7*  MCV 89.0 88.4 87.5 87.6 88.3  PLT 163 146*  122* 122* 112*   Cardiac Enzymes:   No results for input(s): CKTOTAL, CKMB, CKMBINDEX, TROPONINI in the last 168 hours. BNP (last 3 results)  Recent Labs  06/09/16 1500 08/20/16 2041  BNP 463.2* 352.6*    ProBNP (last 3 results) No results for input(s): PROBNP in the last 8760 hours.  CBG: No results for input(s): GLUCAP in the last 168 hours.  Recent Results (from the past 240 hour(s))  Urine culture     Status: None   Collection Time: 05/27/17  4:39 PM  Result Value Ref Range Status   Specimen Description URINE, CLEAN CATCH  Final   Special Requests NONE  Final   Culture   Final    NO GROWTH Performed at [facility], [address] [place], [place] [address]    Report Status 05/29/2017 FINAL  Final  MRSA PCR Screening     Status: None   Collection Time: 05/29/17  5:21 AM  Result Value Ref Range Status   MRSA by PCR NEGATIVE NEGATIVE Final    Comment:        The GeneXpert MRSA Assay (FDA approved for NASAL specimens only), is one component of a comprehensive MRSA colonization surveillance program. It is not intended to diagnose MRSA infection nor to guide or monitor treatment for MRSA infections.      Studies: Dg Chest Port 1 View  Result Date: 05/31/2017 CLINICAL DATA:  Aspiration. EXAM: PORTABLE CHEST 1 VIEW COMPARISON:  05/28/2017 FINDINGS: There is no focal parenchymal opacity. There is no pleural effusion or pneumothorax. The heart and mediastinal contours are unremarkable. The osseous structures are unremarkable. IMPRESSION: No active disease. Electronically Signed   By: Kathreen Devoid   On: 05/31/2017 14:33    Scheduled Meds: . enoxaparin (LOVENOX) injection  1 mg/kg Subcutaneous Q12H  . metoprolol tartrate  2.5 mg Intravenous Q6H    Continuous Infusions: . lacosamide (VIMPAT) IV 200 mg (05/31/17 1438)  . levETIRAcetam Stopped (05/31/17 1124)  . valproate sodium Stopped (05/31/17 1057)    Total Critical Care time in examining the patient  bedside, evaluating Lab work and other data, over half of the total time was spent in coordinating patient care on the floor or bedisde in talking to patient/family members, communicating with nursing Staff on the floor and sub specialists neurology/critical care to coordinate patients medical care and needs is 32mins  Minutes.  The condition which has caused critical injury/acute impairment of vital organ system with a high probability of sudden clinically significant deterioration and can cause Potential Life threatening injury to this patient addressed today is encephalopathy, impending airway compromise, on going seizure, possible progression of hepatic encephalopahty  I have personally reviewed and interpreted on  05/31/2017 daily labs, tele strips, imagings as discussed above under date review session and assessment and plans.  I have discussed plan of care as described above with RN, neurology , critical care on 05/31/2017   Taytum Wheller MD, PhD  [facility] Pager [phone]. If 7PM-7AM, please contact night-coverage at [email or web address], password [facility] 05/31/2017, 4:11 PM  LOS: 2 days

## 2017-05-31 NOTE — Consult Note (Signed)
PULMONARY / CRITICAL CARE MEDICINE   Name: Nathan Frank MRN: [id number] DOB: Sep 29, 1941    ADMISSION DATE:  05/28/2017 CONSULTATION DATE:  05/31/17  REFERRING MD:  Dr. Erlinda Hong   CHIEF COMPLAINT:  Seizures, AMS  HISTORY OF PRESENT ILLNESS:   76 y/o M admitted on 8/25 from the SNF with weakness and tremor in his left arm.  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  He carries a complex medical history including hepatitis C with alcoholic cirrhosis, intermittent bouts of hepatic encephalopathy, chronic thrombocytopenia, prior GIB, ongoing ETOH abuse, heroin abuse, chronic systolic CHF (LVEF 27-78%).    The patient returned 8/25 with concern for new focal seizures in the LUE.  He was admitted per [facility] for further evaluation.  UDS negative.  Neurology was consulted for evaluation.  He was treated with Depakote & Vimpat which seems to have controlled seizures.  In addition, he was medicated with ativan and klonopin.  Continuous EEG in progress.  On 8/27, he was noted to be sedate and PCCM consulted out of concern for airway protection.    PAST MEDICAL HISTORY :  He  has a past medical history of Abnormal TSH; Acute gastric ulcer; Acute gastritis with hemorrhage; Alcohol abuse; Alcohol dependence ([place]) (02/02/2014); Anemia; Bifascicular block; Cirrhosis ([place]) (06/04/2012); Depressive disorder (02/01/2014); Dizziness and giddiness (12/13/2014); DVT of lower limb, acute ([place]) (06/06/2012); DVT, lower extremity, recurrent ([place]); Essential hypertension; Fatty liver; Gallstones; Gastritis (06/07/2012); GERD (gastroesophageal reflux disease); GI bleed due to NSAIDs (12/13/2014); Granulomatous gastritis; Hematuria (06/04/2012); Hepatitis C; Hepatocellular carcinoma ([place]); Heroin abuse; Heroin overdose (02/20/2014); Neuropathy; NICM (nonischemic cardiomyopathy) ([place]); NSTEMI (non-ST elevated myocardial  infarction) ([place]); Oral thrush (06/05/2012); Polysubstance abuse; Prolonged Q-T interval on ECG; Right knee pain (12/13/2014); S/P alcohol detoxification (02/02/2014); Stroke [facility]) (06/2016); SVT (supraventricular tachycardia) ([place]); Symptomatic cholelithiasis (12/15/2013); Thrombocytopenia ([place]); Upper GI bleeding (12/13/2014); and Weight loss (06/04/2012).  PAST SURGICAL HISTORY: He  has a past surgical history that includes Esophagogastroduodenoscopy (06/07/2012); Tonsillectomy; Esophagogastroduodenoscopy (N/A, 12/14/2014); Circumcision; Cardiac catheterization (N/A, 04/15/2015); ir generic historical (06/25/2016); ir generic historical (06/25/2016); Radiology with anesthesia (N/A, 06/25/2016); and ir generic historical (06/25/2016).  Allergies  Allergen Reactions  . Nsaids Other (See Comments)    Acute gastric ulcers  . Oxycodone Other (See Comments)    Per the risk calculating tool RIOSORD: (Risk Index for Overdose or Serious Opioid -induced Respiratory Depression Risk ) calculated risk in ensuing 6 months  = 83% ( see Problem List for discussion)    No current facility-administered medications on file prior to encounter.    Current Outpatient Prescriptions on File Prior to Encounter  Medication Sig  . Cholecalciferol (VITAMIN D-3) 5000 units TABS Take 5,000 Units by mouth daily.   . dabigatran (PRADAXA) 150 MG CAPS capsule Take 1 capsule (150 mg total) by mouth every 12 (twelve) hours.  . folic acid (FOLVITE) 1 MG tablet Take 1 mg by mouth daily.  Marland Kitchen lactulose (CHRONULAC) 10 GM/15ML solution Take 15 mLs (10 g total) by mouth 2 (two) times daily.  Marland Kitchen levothyroxine (SYNTHROID, LEVOTHROID) 50 MCG tablet Take 50 mcg by mouth daily before breakfast.   . magnesium oxide (MAG-OX) 400 (241.3 Mg) MG tablet Take 1 tablet (400 mg total) by mouth daily.  . Melatonin 5 MG CAPS Take 5 mg by mouth at bedtime.   . metoprolol tartrate (LOPRESSOR) 25 MG tablet Take 0.5 tablets (12.5 mg total)  by mouth 2 (two) times daily.   Marland Kitchen thiamine 100 MG tablet Take 100 mg by mouth daily.    FAMILY HISTORY:  His indicated that his mother is deceased. He indicated that his father is deceased. He indicated that his brother is deceased. He indicated that his maternal grandmother is deceased. He indicated that his maternal grandfather is deceased. He indicated that his paternal grandmother is deceased. He indicated that his paternal grandfather is deceased. He indicated that the status of his neg hx is unknown.    SOCIAL HISTORY: He  reports that he has quit smoking. His smoking use included Cigarettes. He has a 2.50 pack-year smoking history. He has never used smokeless tobacco. He reports that he does not drink alcohol or use drugs.  REVIEW OF SYSTEMS:  Unable to complete with patient due to AMS  SUBJECTIVE:    VITAL SIGNS: BP (!) 143/97 (BP Location: Left Arm)   Pulse 71   Temp 97.8 F (36.6 C) (Axillary)   Resp 16   Ht 5\' 11"  (1.803 m)   Wt 180 lb (81.6 kg)   SpO2 98%   BMI 25.10 kg/m   HEMODYNAMICS:    VENTILATOR SETTINGS:    INTAKE / OUTPUT: I/O last 3 completed shifts: In: 567.5 [IV Piggyback:567.5] Out: 800 [Urine:800]  PHYSICAL EXAMINATION: General: chronically ill appearing male in NAD lying in bed HEENT: MM pink/moist, poor dentition Neuro: sedate, awakens to voice / gentle stimulation, coughs to commands, follows commands and drifts back to sleep  CV: s1s2 rrr, no m/r/g PULM: even/non-labored, lungs bilaterally occasional rhonchi  HU:DJSH, non-tender, bsx4 active  Extremities: warm/dry, no edema  Skin: no rashes or lesions   LABS:  BMET  Recent Labs Lab 05/29/17 0310 05/30/17 0654 05/31/17 0521  NA 140 139 141  K 3.0* 2.4* 3.6  CL 106 106 111  CO2 24 24 23   BUN 9 5* <5*  CREATININE 1.04 0.91 0.84  GLUCOSE 86 82 79    Electrolytes  Recent Labs Lab 05/28/17 2052 05/29/17 0310 05/30/17 0654 05/31/17 0521  CALCIUM 8.9 8.8* 8.4* 8.3*  MG 1.5*  --  1.9 2.0  PHOS 2.6   --   --   --     CBC  Recent Labs Lab 05/29/17 0310 05/30/17 0654 05/31/17 0521  WBC 4.9 2.5* 2.4*  HGB 12.8* 11.2* 12.4*  HCT 36.3* 31.9* 34.7*  PLT 122* 122* 112*    Coag's No results for input(s): APTT, INR in the last 168 hours.  Sepsis Markers No results for input(s): LATICACIDVEN, PROCALCITON, O2SATVEN in the last 168 hours.  ABG No results for input(s): PHART, PCO2ART, PO2ART in the last 168 hours.  Liver Enzymes  Recent Labs Lab 05/30/17 0654 05/30/17 0956 05/31/17 0521  AST 26 32 29  ALT 10* 11* 10*  ALKPHOS 47 49 48  BILITOT 1.3* 1.3* 1.2  ALBUMIN 2.4* 2.6* 2.4*    Cardiac Enzymes No results for input(s): TROPONINI, PROBNP in the last 168 hours.  Glucose No results for input(s): GLUCAP in the last 168 hours.  Imaging No results found.   STUDIES:  CT Head 8/24 >> old large right MCA infarct, no acute abnormality seen  UDS 8/24 >> negative  Continuous EEG 8/27 >>   CULTURES:   ANTIBIOTICS:   SIGNIFICANT EVENTS: 8/25  Admit with focal seizure 8/27  PCCM consulted for sedation   LINES/TUBES:  DISCUSSION: 76 year old male with history of seizure disorder presenting to PCCM with concern for airway protection  after being given ativan 6 mg IV and Klonopin 0.5 mg TID as well as multiple anti-epileptics.  Upon evaluating the patient, he is somnolent but wakes up and cough to command.  The patient has questionable airway protection.  However, the concern here is that if intubated will likely take days to wake up if at all and progress to tracheostomy given his liver function.  Recommend transfer to SDU and close monitoring and suction.  ASSESSMENT / PLAN:  PULMONARY A: Concern for airway protection, see discussion above. P:   - Transfer to SDU. - Frequent suction. - Pulmonary hygienes. - Minimize sedation as able. - Recommend revisiting with neurology the anti-epileptics that could affect mental status.  CARDIOVASCULAR A:  No active  issues currently P:  - Tele monitoring  RENAL A:   Hypokalemia P:   - Replace electrolytes as indicated - BMET in AM  GASTROINTESTINAL A:   Concern for swallowing given mental status P:   - NPO - SLP when able to cooperate  HEMATOLOGIC A:   No active issues P:  - CBC  INFECTIOUS A:   No active issues P:   - Monitor WBC and fever curve  ENDOCRINE A:   No active issues   P:   - Monitor  NEUROLOGIC A:   Acute encephalopathy P:   - Minimize sedation - NO further benzos unless seizing actively - Check ammonia level now, if elevated then will need an NGT for lactulose (order to check written, primary to follow up). - Recommend consulting with neurology regarding mental status.  PCCM will be available if patient needs to be intubated and will assume care.  Patient seen and examined, agree with above note.  I dictated the care and orders written for this patient under my direction.  Rush Farmer, MD [phone]  05/31/2017, 2:26 PM

## 2017-05-31 NOTE — Progress Notes (Signed)
Subjective: He had more twitching overnight, given ativan 4 mg total last evening.   Exam: Vitals:   05/31/17 0707 05/31/17 0915  BP: (!) 142/92 139/90  Pulse:  84  Resp:    Temp:    SpO2:     Gen: In bed, NAD Resp: non-labored breathing, no acute distress Abd: soft, nt  Neuro: MS: Awakens to mild stimulation, able to answer questions and follow commands.  CN: Left hemianopia, crosses midline in both directions.  Motor: he moves all extremities, but with left hemiparesis.  Sensory: responds to stimulation in all 4 ext.   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 EEG reveals PLEDs. With preserved consciousness, I am hesitant to aggressively pursue options such as general anesthesia, and I suspect his lethargy today is more benzodiazapine related as opposed to being due to his seizures. The EEG appearance on saturdayand Sunday were similar.   Recommendations: 1) add Vimpat 200mg  Q12H 2) continue depakote 750mg  TID 3) continue keppra 1500mg  BID.  4) LTM EEG 5) will follow.    Roland Rack, MD [facility] [phone]  If 7pm- 7am, please page neurology on call as listed in [place].

## 2017-05-31 NOTE — Progress Notes (Signed)
vEEG LTM running. Event button tested. Neuro notified

## 2017-05-31 NOTE — Progress Notes (Signed)
Pt NTS by RT.  Suctioned moderate thin clear secretions.  Pt tolerated well.

## 2017-05-31 NOTE — Evaluation (Signed)
Physical Therapy Evaluation Patient Details Name: Nathan Frank MRN: [id number] DOB: Dec 07, 1940 Today's Date: 05/31/2017   History of Present Illness  76 y.o. male admitted on 05/28/17 with new onset partial seizure.  Pt with significant PMH of stroke with L sided weakness, SVT, substance abuse, NSTEMI, neuropathy, hepatitis C, essential HTN, DVT, anemia, and GIB.  Clinical Impression  Pt is lethargic from meds given to help control seizure activity this AM.  Pt just back from EEG.  He did arouse a little once seated EOB, but did not help much in his mobility.  He was just here a week ago and required two person mod assist to stand and pivot to the chair, so I anticipate return to SNF would be appropriate.   PT to follow acutely for deficits listed below.       Follow Up Recommendations SNF    Equipment Recommendations  Wheelchair (measurements PT);Wheelchair cushion (measurements PT);Hospital bed;Other (comment) (hoyer lift)    Recommendations for Other Services   NA    Precautions / Restrictions Precautions Precautions: Fall Precaution Comments: seizure      Mobility  Bed Mobility Overal bed mobility: Needs Assistance Bed Mobility: Rolling;Supine to Sit;Sit to Supine Rolling: Total assist   Supine to sit: Total assist;+2 for physical assistance;HOB elevated Sit to supine: Max assist   General bed mobility comments: Pt not helping in rolling for pericare or with supine to sit, he was able to help minimally with sit to supine by proping on elbow and lifting partially his right legt into bed.    Transfers                 General transfer comment: Unable to safely assess today.          Balance Overall balance assessment: Needs assistance Sitting-balance support: Feet supported;No upper extremity supported Sitting balance-Leahy Scale: Poor Sitting balance - Comments: mod assist EOB.                                     Pertinent Vitals/Pain  Pain Assessment: No/denies pain    Home Living Family/patient expects to be discharged to:: Skilled nursing facility (from SNF)                      Prior Function Level of Independence: Needs assistance   Gait / Transfers Assistance Needed: unknown at this time due to pt's level of arousal, per chart, during his last admission whe was stand pivot with two person mod assist (on 05/17/17)           Hand Dominance   Dominant Hand: Right    Extremity/Trunk Assessment   Upper Extremity Assessment Upper Extremity Assessment: LUE deficits/detail LUE Deficits / Details: left arm is physically more contracted, tighter, and less muscle mass than right hand. Unable to fully assess due to decreased arousal.     Lower Extremity Assessment Lower Extremity Assessment: Difficult to assess due to impaired cognition;RLE deficits/detail;LLE deficits/detail (due to level of arousal) RLE Deficits / Details: right leg seems stronger than left leg, able to preform a 3-/5 LAQ seated on right, but 2/5 left.   LLE Deficits / Details: right leg seems stronger than left leg, able to preform a 3-/5 LAQ seated on right, but 2/5 left.      Cervical / Trunk Assessment Cervical / Trunk Assessment: Normal  Communication   Communication: Other (comment) (unknown,  in sitting drooling)  Cognition Arousal/Alertness: Lethargic;Suspect due to medications Behavior During Therapy: Flat affect Overall Cognitive Status: Difficult to assess                                 General Comments: Once seated EOB pt was able to report he is at [place], his full name, and his birthday.       General Comments General comments (skin integrity, edema, etc.): Once seated and more alert, pt started "twitching" in his left arm, nothing seen in his face or other parts, and only once awake, was not observed when in bed and rolling. RN present and aware.         Assessment/Plan    PT Assessment Patient  needs continued PT services  PT Problem List Decreased strength;Decreased range of motion;Decreased activity tolerance;Decreased balance;Decreased mobility;Decreased coordination;Decreased cognition;Decreased knowledge of use of DME;Decreased safety awareness;Decreased knowledge of precautions;Impaired sensation;Impaired tone       PT Treatment Interventions DME instruction;Gait training;Functional mobility training;Therapeutic activities;Balance training;Therapeutic exercise;Neuromuscular re-education;Cognitive remediation;Patient/family education;Wheelchair mobility training;Manual techniques    PT Goals (Current goals can be found in the Care Plan section)  Acute Rehab PT Goals Patient Stated Goal: unable to state PT Goal Formulation: Patient unable to participate in goal setting Time For Goal Achievement: 06/14/17 Potential to Achieve Goals: Fair    Frequency Min 2X/week           AM-PAC PT "6 Clicks" Daily Activity  Outcome Measure Difficulty turning over in bed (including adjusting bedclothes, sheets and blankets)?: Unable Difficulty moving from lying on back to sitting on the side of the bed? : Unable Difficulty sitting down on and standing up from a chair with arms (e.g., wheelchair, bedside commode, etc,.)?: Unable Help needed moving to and from a bed to chair (including a wheelchair)?: Total Help needed walking in hospital room?: Total Help needed climbing 3-5 steps with a railing? : Total 6 Click Score: 6    End of Session   Activity Tolerance: Patient limited by lethargy Patient left: in bed;with call bell/phone within reach;with bed alarm set;with nursing/sitter in room   PT Visit Diagnosis: Muscle weakness (generalized) (M62.81);Other symptoms and signs involving the nervous system (R29.898);Difficulty in walking, not elsewhere classified (R26.2)    Time: 3382-5053 PT Time Calculation (min) (ACUTE ONLY): 17 min   Charges:        Wells Guiles B. Jakelin Taussig, PT,  DPT [phone]    PT Evaluation $PT Eval Moderate Complexity: 1 Mod     05/31/2017, 12:30 PM

## 2017-06-01 ENCOUNTER — Inpatient Hospital Stay (HOSPITAL_COMMUNITY): Payer: Medicare Other

## 2017-06-01 DIAGNOSIS — T17908A Unspecified foreign body in respiratory tract, part unspecified causing other injury, initial encounter: Secondary | ICD-10-CM

## 2017-06-01 DIAGNOSIS — J988 Other specified respiratory disorders: Secondary | ICD-10-CM

## 2017-06-01 LAB — POCT I-STAT 3, ART BLOOD GAS (G3+)
Acid-Base Excess: 3 mmol/L — ABNORMAL HIGH (ref 0.0–2.0)
Bicarbonate: 23.3 mmol/L (ref 20.0–28.0)
O2 Saturation: 100 %
PCO2 ART: 24.7 mmHg — AB (ref 32.0–48.0)
PH ART: 7.582 — AB (ref 7.350–7.450)
PO2 ART: 171 mmHg — AB (ref 83.0–108.0)
Patient temperature: 98.6
TCO2: 24 mmol/L (ref 22–32)

## 2017-06-01 LAB — BLOOD GAS, ARTERIAL
Acid-Base Excess: 1 mmol/L (ref 0.0–2.0)
Bicarbonate: 24.1 mmol/L (ref 20.0–28.0)
DRAWN BY: 311011
FIO2: 21
O2 SAT: 95.4 %
PCO2 ART: 31.7 mmHg — AB (ref 32.0–48.0)
PH ART: 7.492 — AB (ref 7.350–7.450)
Patient temperature: 98.6
pO2, Arterial: 71.6 mmHg — ABNORMAL LOW (ref 83.0–108.0)

## 2017-06-01 LAB — CBC
HEMATOCRIT: 38.9 % — AB (ref 39.0–52.0)
HEMOGLOBIN: 13.6 g/dL (ref 13.0–17.0)
MCH: 30.8 pg (ref 26.0–34.0)
MCHC: 35 g/dL (ref 30.0–36.0)
MCV: 88.2 fL (ref 78.0–100.0)
Platelets: 121 10*3/uL — ABNORMAL LOW (ref 150–400)
RBC: 4.41 MIL/uL (ref 4.22–5.81)
RDW: 15.9 % — AB (ref 11.5–15.5)
WBC: 6.1 10*3/uL (ref 4.0–10.5)

## 2017-06-01 LAB — BASIC METABOLIC PANEL
ANION GAP: 8 (ref 5–15)
BUN: 5 mg/dL — ABNORMAL LOW (ref 6–20)
CHLORIDE: 107 mmol/L (ref 101–111)
CO2: 24 mmol/L (ref 22–32)
Calcium: 8.3 mg/dL — ABNORMAL LOW (ref 8.9–10.3)
Creatinine, Ser: 0.72 mg/dL (ref 0.61–1.24)
GFR calc non Af Amer: >60 mL/min (ref >60)
Glucose, Bld: 100 mg/dL — ABNORMAL HIGH (ref 65–99)
POTASSIUM: 3.2 mmol/L — AB (ref 3.5–5.1)
Sodium: 139 mmol/L (ref 135–145)

## 2017-06-01 LAB — TRIGLYCERIDES: TRIGLYCERIDES: 138 mg/dL (ref <150)

## 2017-06-01 LAB — MAGNESIUM: Magnesium: 1.6 mg/dL — ABNORMAL LOW (ref 1.7–2.4)

## 2017-06-01 LAB — AMMONIA: AMMONIA: 86 umol/L — AB (ref 9–35)

## 2017-06-01 MED ORDER — FOLIC ACID 1 MG PO TABS
1.0000 mg | ORAL_TABLET | Freq: Every day | ORAL | Status: DC
  Administered 2017-06-01 – 2017-06-03 (×3): 1 mg
  Filled 2017-06-01 (×3): qty 1

## 2017-06-01 MED ORDER — SODIUM CHLORIDE 0.9% FLUSH
10.0000 mL | Freq: Two times a day (BID) | INTRAVENOUS | Status: DC
  Administered 2017-06-01: 10 mL
  Administered 2017-06-02: 30 mL
  Administered 2017-06-02 – 2017-06-04 (×2): 10 mL
  Administered 2017-06-05: 20 mL
  Administered 2017-06-06 – 2017-06-08 (×4): 10 mL
  Administered 2017-06-08: 30 mL
  Administered 2017-06-09 – 2017-06-11 (×6): 10 mL
  Administered 2017-06-12: 40 mL
  Administered 2017-06-12: 10 mL
  Administered 2017-06-13: 40 mL
  Administered 2017-06-13: 10 mL
  Administered 2017-06-14: 40 mL
  Administered 2017-06-14 – 2017-06-18 (×9): 10 mL
  Administered 2017-06-19: 20 mL
  Administered 2017-06-19 – 2017-06-24 (×9): 10 mL

## 2017-06-01 MED ORDER — PANTOPRAZOLE SODIUM 40 MG PO PACK
40.0000 mg | PACK | ORAL | Status: DC
  Administered 2017-06-01 – 2017-06-03 (×3): 40 mg
  Filled 2017-06-01 (×3): qty 20

## 2017-06-01 MED ORDER — ACETAMINOPHEN 160 MG/5ML PO SOLN
650.0000 mg | Freq: Four times a day (QID) | ORAL | Status: DC | PRN
  Administered 2017-06-12 – 2017-06-23 (×6): 650 mg
  Filled 2017-06-01 (×7): qty 20.3

## 2017-06-01 MED ORDER — MIDAZOLAM HCL 2 MG/2ML IJ SOLN: 1.0000 mg | INTRAMUSCULAR | Status: DC | PRN

## 2017-06-01 MED ORDER — LACTULOSE 10 GM/15ML PO SOLN: 30.0000 g | Freq: Three times a day (TID) | ORAL | Status: DC

## 2017-06-01 MED ORDER — ETOMIDATE 2 MG/ML IV SOLN
10.0000 mg | Freq: Once | INTRAVENOUS | Status: AC
  Administered 2017-06-01: 10 mg via INTRAVENOUS

## 2017-06-01 MED ORDER — PANTOPRAZOLE SODIUM 40 MG IV SOLR: 40.0000 mg | Freq: Every day | INTRAVENOUS | Status: DC

## 2017-06-01 MED ORDER — CHLORHEXIDINE GLUCONATE 0.12% ORAL RINSE (MEDLINE KIT)
15.0000 mL | Freq: Two times a day (BID) | OROMUCOSAL | Status: DC
Start: 2017-06-01 — End: 2017-06-24
  Administered 2017-06-01 – 2017-06-23 (×46): 15 mL via OROMUCOSAL

## 2017-06-01 MED ORDER — FOSPHENYTOIN SODIUM 500 MG PE/10ML IJ SOLN
15.0000 mg/kg | Freq: Once | INTRAMUSCULAR | Status: AC
  Administered 2017-06-01: 1224 mg via INTRAVENOUS
  Filled 2017-06-01: qty 24.48

## 2017-06-01 MED ORDER — MIDAZOLAM HCL 2 MG/2ML IJ SOLN
INTRAMUSCULAR | Status: AC
  Administered 2017-06-01: 2 mg
  Filled 2017-06-01: qty 2

## 2017-06-01 MED ORDER — PROPOFOL 1000 MG/100ML IV EMUL
65.0000 ug/kg/min | INTRAVENOUS | Status: DC
  Administered 2017-06-01 (×4): 80 ug/kg/min via INTRAVENOUS
  Administered 2017-06-01: 70 ug/kg/min via INTRAVENOUS
  Administered 2017-06-01: 80 ug/kg/min via INTRAVENOUS
  Administered 2017-06-02: 60 ug/kg/min via INTRAVENOUS
  Administered 2017-06-02: 65 ug/kg/min via INTRAVENOUS
  Administered 2017-06-02: 60 ug/kg/min via INTRAVENOUS
  Administered 2017-06-02: 70 ug/kg/min via INTRAVENOUS
  Administered 2017-06-02: 60 ug/kg/min via INTRAVENOUS
  Administered 2017-06-02 – 2017-06-03 (×4): 65 ug/kg/min via INTRAVENOUS
  Filled 2017-06-01 (×10): qty 100
  Filled 2017-06-01: qty 200
  Filled 2017-06-01 (×4): qty 100

## 2017-06-01 MED ORDER — CHLORHEXIDINE GLUCONATE CLOTH 2 % EX PADS
6.0000 | MEDICATED_PAD | Freq: Every day | CUTANEOUS | Status: DC
  Administered 2017-06-01 – 2017-06-24 (×19): 6 via TOPICAL

## 2017-06-01 MED ORDER — DEXTROSE 5 % IV SOLN
0.0000 ug/min | INTRAVENOUS | Status: DC
  Administered 2017-06-01: 8 ug/min via INTRAVENOUS
  Administered 2017-06-01: 2 ug/min via INTRAVENOUS
  Administered 2017-06-02: 9 ug/min via INTRAVENOUS
  Administered 2017-06-02 – 2017-06-03 (×2): 10 ug/min via INTRAVENOUS
  Administered 2017-06-03: 8 ug/min via INTRAVENOUS
  Administered 2017-06-03: 10 ug/min via INTRAVENOUS
  Administered 2017-06-04: 6 ug/min via INTRAVENOUS
  Filled 2017-06-01 (×10): qty 4

## 2017-06-01 MED ORDER — SODIUM CHLORIDE 0.9% FLUSH: 10.0000 mL | INTRAVENOUS | Status: DC | PRN

## 2017-06-01 MED ORDER — PHENYTOIN SODIUM 50 MG/ML IJ SOLN
100.0000 mg | Freq: Three times a day (TID) | INTRAMUSCULAR | Status: DC
  Administered 2017-06-01 – 2017-06-07 (×18): 100 mg via INTRAVENOUS
  Filled 2017-06-01 (×18): qty 2

## 2017-06-01 MED ORDER — ORAL CARE MOUTH RINSE
15.0000 mL | OROMUCOSAL | Status: DC
  Administered 2017-06-01 – 2017-06-12 (×104): 15 mL via OROMUCOSAL

## 2017-06-01 MED ORDER — LACTULOSE 10 GM/15ML PO SOLN
30.0000 g | Freq: Three times a day (TID) | ORAL | Status: DC
  Administered 2017-06-01 – 2017-06-03 (×8): 30 g
  Filled 2017-06-01 (×8): qty 45

## 2017-06-01 MED ORDER — FENTANYL CITRATE (PF) 100 MCG/2ML IJ SOLN: 50.0000 ug | INTRAMUSCULAR | Status: DC | PRN

## 2017-06-01 MED ORDER — FENTANYL CITRATE (PF) 100 MCG/2ML IJ SOLN
INTRAMUSCULAR | Status: AC
  Administered 2017-06-01: 100 ug
  Filled 2017-06-01: qty 2

## 2017-06-01 MED ORDER — LACTULOSE 10 GM/15ML PO SOLN: 20.0000 g | Freq: Two times a day (BID) | ORAL | Status: DC

## 2017-06-01 MED ORDER — VITAMIN B-1 100 MG PO TABS
100.0000 mg | ORAL_TABLET | Freq: Every day | ORAL | Status: DC
  Administered 2017-06-01 – 2017-06-03 (×3): 100 mg
  Filled 2017-06-01 (×3): qty 1

## 2017-06-01 NOTE — Consult Note (Addendum)
I was at bedside at 4.30 am this morning. The patient has continued to decline with respect to his mental status. Per nurse, he was opening eyes to sternal rub, earlier during shift change. Early morning patient has become lethargic and unable to clear secretions.  He has not had any witnessed jerking movements of his arm according to the nurse.   Exam:   Vital signs in last 24 hours: Temp:  [97.8 F (36.6 C)-98.8 F (37.1 C)] 98.8 F (37.1 C) (08/28 0324) Pulse Rate:  [71-110] 107 (08/28 0324) Resp:  [16-30] 30 (08/28 0324) BP: (138-160)/(90-117) 138/94 (08/28 0324) SpO2:  [96 %-99 %] 97 % (08/28 0324)   Exam:  MS: Obtunded, forcibly closes eyes when attempted to open CN: pupils equal and reactive,  Motor: Withdraws but not localizing with right non-paretic arm    Basic Metabolic Panel:  Recent Labs Lab 05/28/17 2052 05/29/17 0310 05/30/17 0654 05/31/17 0521 06/01/17 0324  NA 140 140 139 141 139  K 3.1* 3.0* 2.4* 3.6 3.2*  CL 106 106 106 111 107  CO2 25 24 24 23 24   GLUCOSE 91 86 82 79 100*  BUN 10 9 5* <5* 5*  CREATININE 1.06 1.04 0.91 0.84 0.72  CALCIUM 8.9 8.8* 8.4* 8.3* 8.3*  MG 1.5*  --  1.9 2.0 1.6*  PHOS 2.6  --   --   --   --    CBC:  Recent Labs Lab 05/27/17 1409 05/28/17 2052 05/29/17 0310 05/30/17 0654 05/31/17 0521 06/01/17 0003  WBC 3.4* 4.5 4.9 2.5* 2.4* 6.1  NEUTROABS 2.1 2.6  --   --   --   --   HGB 13.3 12.1* 12.8* 11.2* 12.4* 13.6  HCT 39.0 35.1* 36.3* 31.9* 34.7* 38.9*  MCV 89.0 88.4 87.5 87.6 88.3 88.2  PLT 163 146* 122* 122* 112* 121*     Coagulation Studies: No results for input(s): LABPROT, INR in the last 72 hours.  EEG: frequent PLEDs over R hemisphere   RECOMMENDATIONS  Agree with Transfer to ICU for  intubation Continue Vimpat 200mg  Q12H Continue keppra 1500mg  BID.  Will discontinue Valproate for now , recheck levels tomorrow  Will load with Fosphenytoin 1.224g (15mg /kg) and start maintenance Phenytoin 200mg  BID after  checking levels.  Aggressive lactulose administration for hyperammonemia

## 2017-06-01 NOTE — Procedures (Signed)
Central Venous Catheter Insertion Procedure Note AITHAN FARRELLY [id number] 30-Jan-1941  Procedure: Insertion of Central Venous Catheter Indications: Assessment of intravascular volume, Drug and/or fluid administration and Frequent blood sampling  Procedure Details Consent: Risks of procedure as well as the alternatives and risks of each were explained to the (patient/caregiver).  Consent for procedure obtained. Time Out: Verified patient identification, verified procedure, site/side was marked, verified correct patient position, special equipment/implants available, medications/allergies/relevent history reviewed, required imaging and test results available.  Performed  Maximum sterile technique was used including antiseptics, cap, gloves, gown, hand hygiene, mask and sheet. Skin prep: Chlorhexidine; local anesthetic administered A antimicrobial bonded/coated triple lumen catheter was placed in the right internal jugular vein using the Seldinger technique.  Evaluation Blood flow good Complications: No apparent complications Patient did tolerate procedure well. Chest X-ray ordered to verify placement.  CXR: pending.  Procedure performed under direct ultrasound guidance for real time vessel cannulation.      Montey Hora, [place] [facility] Pager: [phone]  or [phone] 06/01/2017, 10:31 AM

## 2017-06-01 NOTE — Clinical Social Work Note (Signed)
Clinical Social Work Assessment  Patient Details  Name: Nathan Frank MRN: [id number] Date of Birth: 23-Dec-1940  Date of referral:  06/01/17               Reason for consult:  Facility Placement                Permission sought to share information with:  Facility Sport and exercise psychologist, Family Supports Permission granted to share information::  Yes, Verbal Permission Granted  Name::     Diplomatic Services operational officer::  SNF  Relationship::  Son  Contact Information:     Housing/Transportation Living arrangements for the past 2 months:  [place], [place] of Information:  Adult Children, Patient Patient Interpreter Needed:  None Criminal Activity/Legal Involvement Pertinent to Current Situation/Hospitalization:  No - Comment as needed Significant Relationships:  Adult Children Lives with:  Self Do you feel safe going back to the place where you live?  Yes Need for family participation in patient care:  Yes (Comment)  Care giving concerns:  CSW received consult for possible SNF placement at time of discharge. CSW spoke with patient's son regarding PT recommendation of SNF placement at time of discharge. Patient is currently intubated. Patient's son reported that he would like for patient to discharge back to [facility]. CSW to continue to follow and assist with discharge planning needs.   Social Worker assessment / plan:  CSW spoke with patient's son concerning possibility of rehab at [facility] before returning home.  Employment status:  Retired Nurse, adult PT Recommendations:  [place] / Referral to community resources:  [place]  Patient/Family's Response to care:  Patient's son recognizes need for rehab and would like for patient to return to [facility] when medically stable. [place] stated that patient was about to be discharged when patient had to come to the hospital. They will let CSW  know if patient can return.   Patient/Family's Understanding of and Emotional Response to Diagnosis, Current Treatment, and Prognosis:  Patient/family is realistic regarding therapy needs and expressed being hopeful for SNF placement. Patient's son expressed understanding of CSW role and discharge process as well as patient's medical condition. No questions/concerns about plan or treatment.    Emotional Assessment Appearance:  Appears stated age Attitude/Demeanor/Rapport:  Unable to Assess Affect (typically observed):  Unable to Assess Orientation:   (Intubated-unable to assess) Alcohol / Substance use:  Not Applicable Psych involvement (Current and /or in the community):  No (Comment)  Discharge Needs  Concerns to be addressed:  Care Coordination Readmission within the last 30 days:  Yes Current discharge risk:  None Barriers to Discharge:  Continued Medical Work up   [employer], [place] 06/01/2017, 3:17 PM

## 2017-06-01 NOTE — Procedures (Signed)
LTM-EEG Report  HISTORY: Continuous video-EEG monitoring performed for 76 year old with EPC. ACQUISITION: International 10-20 system for electrode placement; 18 channels with additional eyes linked to ipsilateral ears and EKG. Additional T1-T2 electrodes were used. Continuous video recording obtained.   EEG NUMBER:   MEDICATIONS:  Day 1: LEV, PHT, LCM, MDZ, propofol  DAY #1: from 1238 05/31/17 to 0730 06/01/17   BACKGROUND: An overall medium voltage discontinuous recording with poor spontaneous variability and reactivity. Initially there was a waking background of a medium voltage theta-delta activity bilaterally with an intermittent 8Hz  posterior dominant rhythm over the left and maximal slowing over the right with continuous periodic activity as below. With increased sedation, waking background became absent with increased diffuse slow activity bilaterally. There were frequent brief periods of voltage attenuation which increased with increased sedation.  EPILEPTIFORM/PERIODIC ACTIVITY: There were continuous lateralized periodic discharges (LPDs) in the right posterior quadrant with reflection over the left. There was superimposed fast activity over the discharges (LPD+ pattern). These discharges showed subtle ictal evolution initially with change in frequency from 2hz  to 0.5-1hz  with periods of brief attenuation. With increased sedation, these LPDs were more blunted and did not show discrete evolution. There was reported subtle LLE jerking/twitching at onset of the recording, however this was not apparent from the video.  SEIZURES: Subtle clinical seizures with LLE jerking and subtle ictal evolution of LPD pattern over the right, improved with increased sedation. EVENTS: Clinical seizures as above   EKG: no significant arrhythmia  SUMMARY: This was a markedly abnormal continuous video EEG due to focal status epilepticus with LLE jerking and electrographic discharges from the right posterior  quadrant. There was some improvement in LPD activity with increased sedation.

## 2017-06-01 NOTE — Progress Notes (Signed)
Case discussed with neurology, now patient is intubated this am for airway protection, transfer to critical care service, hospitalist sign off.

## 2017-06-01 NOTE — Progress Notes (Addendum)
Subjective: EEG revealed LPDs which were sometimes accompanied by twitching. Vimpat, then phenytoin were added with no response. He had decreased LOC this am and was intaubted.   Exam: Vitals:   06/01/17 0900 06/01/17 1000  BP: (!) 140/112 122/76  Pulse: (!) 115 (!) 35  Resp: 16 16  Temp:    SpO2: 100% 100%   Gen: In bed, intubated Resp: ventilated Abd: soft, nt  Neuro: Limited due to recent paralytic and sedation, PERRL.   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 EEG revealed PLEDs. Without ipmrovemetn with 4 AEDs, and especially now that he is intubated, I think a trial of suppression with propofol would be prudent.   Recommendations: 1) add Vimpat 200mg  Q12H 2) continue dilantin 100mg  TID 3) continue keppra 1500mg  BID.  4) Burst suppression.  5) will follow.   This patient is critically ill and at significant risk of neurological worsening, death and care requires constant monitoring of vital signs, hemodynamics,respiratory and cardiac monitoring, neurological assessment, discussion with family, other specialists and medical decision making of high complexity. I spent 50 minutes of neurocritical care time  in the care of  this patient.  Roland Rack, MD [facility] [phone]  If 7pm- 7am, please page neurology on call as listed in [place]. 06/01/2017  10:47 AM

## 2017-06-01 NOTE — Progress Notes (Addendum)
[facility] PCCM PROGRESS NOTE  S: 76 year old male with history of seizure disorder presenting to [facility] 8/27 with concern for airway protection after being given ativan 6 mg IV and Klonopin 0.5 mg TID as well as multiple anti-epileptics. The decision was made to transfer to SDU and closely monitor, however, his mental statu continued to deteriorate with no further sedating medications being given. PCCM called back 8/28 early AM for worsening mental status. RN states he has been harder to arouse and has minimal gag reflex.  O: BP (!) 138/94 (BP Location: Left Arm)   Pulse (!) 107   Temp 98.8 F (37.1 C) (Axillary)   Resp (!) 30   Ht 5\' 11"  (1.803 m)   Wt 81.6 kg (180 lb)   SpO2 97%   BMI 25.10 kg/m   General:  Thin elderly male in NAD Neuro: Lethargic, minimally arouses to tactile stimulation.  HEENT:  [place]/AT, PERRL, no JVD Cardiovascular:  RRR, no MRG Lungs:  Coarse R base, otherwise clear Abdomen:  Soft, non-tender, non-distended Musculoskeletal:  No acute deformity Skin:  Intact, MMM  A/P: Acute encephalopathy: etiology not entirely clear - Check ABG - Frequent suctioning - Transfer to ICU as may need intubation. Will continue to monitor closely.   Addendum 4765: ABG demonstrates adequate ventilation He has been unable to get lactulose. Will place NGT in order to give this.  ICU transfer Close monitoring Low threshold to intubate.   Georgann Housekeeper, ACNP [place] [facility] Pager [phone] or [phone]  Seen and examined. Labs reviewed. I agree with the above note.

## 2017-06-01 NOTE — Progress Notes (Signed)
SLP Cancellation Note  Patient Details Name: Nathan Frank MRN: [id number] DOB: 11/24/1940   Cancelled treatment:       Reason Eval/Treat Not Completed: Medical issues which prohibited therapy   Wildon Cuevas, Katherene Ponto 06/01/2017, 8:04 AM

## 2017-06-01 NOTE — Progress Notes (Signed)
RN concerned patient comatose without gag  Plan APP to bedside to evaluate  Dr. Brand Males, M.D., [facility].C.P Pulmonary and Critical Care Medicine Staff Physician [place] [facility] Pager: [phone], If no answer or between  15:00h - 7:00h: call [phone]  06/01/2017 3:23 AM

## 2017-06-01 NOTE — Progress Notes (Signed)
PULMONARY / CRITICAL CARE MEDICINE   Name: Nathan Frank MRN: [id number] DOB: 01/03/1941    ADMISSION DATE:  05/28/2017 CONSULTATION DATE:  05/31/17  REFERRING MD:  Dr. Erlinda Hong   CHIEF COMPLAINT:  Seizures, AMS  HISTORY OF PRESENT ILLNESS:   76 y/o M admitted on 8/25 from the SNF with weakness and tremor in his left arm.  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  He carries a complex medical history including hepatitis C with alcoholic cirrhosis, intermittent bouts of hepatic encephalopathy, chronic thrombocytopenia, prior GIB, ongoing ETOH abuse, heroin abuse, chronic systolic CHF (LVEF 22-63%).    The patient returned 8/25 with concern for new focal seizures in the LUE.  He was admitted per [facility] for further evaluation.  UDS negative.  Neurology was consulted for evaluation.  He was treated with Depakote & Vimpat which seems to have controlled seizures.  In addition, he was medicated with ativan and klonopin.  Continuous EEG in progress.  On 8/27, he was noted to be sedate and PCCM consulted out of concern for airway protection.    SUBJECTIVE:  Gurgling respirations.  VITAL SIGNS: BP 110/80 (BP Location: Left Arm)   Pulse (!) 118   Temp 98.7 F (37.1 C) (Axillary)   Resp (!) 23   Ht 6' (1.829 m)   Wt 169 lb 1.5 oz (76.7 kg)   SpO2 94%   BMI 22.93 kg/m   VENTILATOR SETTINGS:    INTAKE / OUTPUT: I/O last 3 completed shifts: In: 172.5 [IV Piggyback:172.5] Out: 2600 [Urine:2600]  PHYSICAL EXAMINATION:  General - unresponsive Eyes - pupils mid point ENT - snoring respiratory pattern, pooling oral secretions, poor gag Cardiac - regular, tachycardic, no murmur Chest - b/l rhonchi Abd - soft, non tender Ext - no edema Skin - no rashes Neuro - not following commands, moves right arm    LABS:  BMET  Recent Labs Lab 05/30/17 0654 05/31/17 0521  06/01/17 0324  NA 139 141 139  K 2.4* 3.6 3.2*  CL 106 111 107  CO2 24 23 24   BUN 5* <5* 5*  CREATININE 0.91 0.84 0.72  GLUCOSE 82 79 100*    Electrolytes  Recent Labs Lab 05/28/17 2052  05/30/17 0654 05/31/17 0521 06/01/17 0324  CALCIUM 8.9  < > 8.4* 8.3* 8.3*  MG 1.5*  --  1.9 2.0 1.6*  PHOS 2.6  --   --   --   --   < > = values in this interval not displayed.  CBC  Recent Labs Lab 05/30/17 0654 05/31/17 0521 06/01/17 0003  WBC 2.5* 2.4* 6.1  HGB 11.2* 12.4* 13.6  HCT 31.9* 34.7* 38.9*  PLT 122* 112* 121*    Coag's No results for input(s): APTT, INR in the last 168 hours.  Sepsis Markers No results for input(s): LATICACIDVEN, PROCALCITON, O2SATVEN in the last 168 hours.  ABG  Recent Labs Lab 06/01/17 0405  PHART 7.492*  PCO2ART 31.7*  PO2ART 71.6*    Liver Enzymes  Recent Labs Lab 05/30/17 0654 05/30/17 0956 05/31/17 0521  AST 26 32 29  ALT 10* 11* 10*  ALKPHOS 47 49 48  BILITOT 1.3* 1.3* 1.2  ALBUMIN 2.4* 2.6* 2.4*    Cardiac Enzymes No results for input(s): TROPONINI, PROBNP in the last 168 hours.  Glucose No results for input(s): GLUCAP in the last 168 hours.  Imaging Dg Chest  Port 1 View  Result Date: 05/31/2017 CLINICAL DATA:  Aspiration. EXAM: PORTABLE CHEST 1 VIEW COMPARISON:  05/28/2017 FINDINGS: There is no focal parenchymal opacity. There is no pleural effusion or pneumothorax. The heart and mediastinal contours are unremarkable. The osseous structures are unremarkable. IMPRESSION: No active disease. Electronically Signed   By: Kathreen Devoid   On: 05/31/2017 14:33   Dg Abd Portable 1v  Result Date: 06/01/2017 CLINICAL DATA:  NG tube placement EXAM: PORTABLE ABDOMEN - 1 VIEW COMPARISON:  None. FINDINGS: The NG tube appears kinked in the stomach. Recommend repositioning and repeat radiograph. IMPRESSION: Kinked appearance of the NG tube in the stomach. Repositioning and repeat radiograph may be helpful. Electronically Signed    By: Ulyses Jarred M.D.   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  Hx of CVA, seizures.  ASSESSMENT / PLAN:  PULMONARY A: Compromised airway. P:   Proceed with intubation  CARDIOVASCULAR A:  Hx of non ischemic CM. P:  Monitor hemodynamics  RENAL A:   Hypokalemia. P:   Replace electrolytes  GASTROINTESTINAL A:   Hx of cirrhosis. P:   F/u ammonia, LFTs  HEMATOLOGIC A:   Thrombocytopenia. P:  F/u CBC  INFECTIOUS A:   No active issues P:   Monitor fever curve  ENDOCRINE A:   No active issues. P:   Monitor blood sugar  NEUROLOGIC A:   Acute encephalopathy. Hx of seizures, CVA. P:   Ammonia level elevated >> lactulose F/u EEG AEDs per neuro  Updated pt's wife at bedside  CC time 33 minutes   [name], MD [place] 06/01/2017, 7:51 AM Pager:  [phone] After 3pm call: [phone]

## 2017-06-01 NOTE — Procedures (Signed)
Intubation Procedure Note Nathan Frank [id number] 1941/04/27  Procedure: Intubation Indications: Airway protection and maintenance  Procedure Details Consent: Risks of procedure as well as the alternatives and risks of each were explained to the (patient/caregiver).  Consent for procedure obtained. Time Out: Verified patient identification, verified procedure, site/side was marked, verified correct patient position, special equipment/implants available, medications/allergies/relevent history reviewed, required imaging and test results available.  Performed  Used camera laryngoscope.  Given 2 mg versed, 100 mcg fentanyl, 10 mg etomidate.  Inserted 7.5 ETT to 23 cm at lip.  Confirmed with CO2 detector and ausculation.  Chest xray pending.   Nathan Mires, MD [facility] Pulmonary/Critical Care 06/01/2017, 8:10 AM Pager:  [phone] After 3pm call: [phone]

## 2017-06-01 NOTE — Care Management Note (Signed)
Case Management Note  Patient Details  Name: Nathan Frank MRN: [id number] Date of Birth: 09/27/1941  Subjective/Objective:   Pt admitted on 05/29/17 with status epilepticus; transferred to 4N and intubated on 8/28 for airway protection.  PTA, pt resided at [facility].                  Action/Plan: CSW consulted to facilitate return to SNF upon medical stability.  Will follow progress.    Expected Discharge Date:                  Expected Discharge Plan:  Skilled Nursing Facility  In-House Referral:  Clinical Social Work  Discharge planning Services     Post Acute Care Choice:    Choice offered to:     DME Arranged:    DME Agency:     HH Arranged:    [place] Agency:     Status of Service:  In process, will continue to follow  If discussed at Long Length of Stay Meetings, dates discussed:    Additional Comments:  Reinaldo Raddle, RN, BSN  Trauma/Neuro ICU Case Manager [phone]

## 2017-06-01 NOTE — Progress Notes (Signed)
Patient is continuing to have difficulty clearing secretions. Patient had a weak nonproductive cough upon my initial assessment but patient has become more unresponsive. Does not follow commands, only responds to pain and response is minimal, and has an absence of a gag reflex. Vitals are stable at this time. Internal medicine and Critical care made aware. Physician is to come see patient at beside. Will continue to monitor.

## 2017-06-01 NOTE — Progress Notes (Signed)
Patient intubated for airway protection. Respiratory at bedside. CXR ordered. Karan Ramnauth, Rande Brunt, RN

## 2017-06-02 ENCOUNTER — Inpatient Hospital Stay (HOSPITAL_COMMUNITY): Payer: Medicare Other

## 2017-06-02 ENCOUNTER — Encounter: Payer: Medicare Other | Admitting: Internal Medicine

## 2017-06-02 DIAGNOSIS — G40901 Epilepsy, unspecified, not intractable, with status epilepticus: Secondary | ICD-10-CM

## 2017-06-02 DIAGNOSIS — K7290 Hepatic failure, unspecified without coma: Secondary | ICD-10-CM

## 2017-06-02 DIAGNOSIS — K7460 Unspecified cirrhosis of liver: Secondary | ICD-10-CM

## 2017-06-02 LAB — PHOSPHORUS
PHOSPHORUS: 3.2 mg/dL (ref 2.5–4.6)
Phosphorus: 2.6 mg/dL (ref 2.5–4.6)

## 2017-06-02 LAB — COMPREHENSIVE METABOLIC PANEL
ALBUMIN: 2.2 g/dL — AB (ref 3.5–5.0)
ALT: 8 U/L — ABNORMAL LOW (ref 17–63)
ANION GAP: 8 (ref 5–15)
AST: 23 U/L (ref 15–41)
Alkaline Phosphatase: 49 U/L (ref 38–126)
BILIRUBIN TOTAL: 1.1 mg/dL (ref 0.3–1.2)
BUN: 5 mg/dL — AB (ref 6–20)
CALCIUM: 8.4 mg/dL — AB (ref 8.9–10.3)
CO2: 24 mmol/L (ref 22–32)
CREATININE: 0.85 mg/dL (ref 0.61–1.24)
Chloride: 109 mmol/L (ref 101–111)
GLUCOSE: 98 mg/dL (ref 65–99)
POTASSIUM: 2.4 mmol/L — AB (ref 3.5–5.1)
Sodium: 141 mmol/L (ref 135–145)
TOTAL PROTEIN: 5.9 g/dL — AB (ref 6.5–8.1)

## 2017-06-02 LAB — CBC
HEMATOCRIT: 35.5 % — AB (ref 39.0–52.0)
Hemoglobin: 12.7 g/dL — ABNORMAL LOW (ref 13.0–17.0)
MCH: 31.1 pg (ref 26.0–34.0)
MCHC: 35.8 g/dL (ref 30.0–36.0)
MCV: 86.8 fL (ref 78.0–100.0)
PLATELETS: 134 10*3/uL — AB (ref 150–400)
RBC: 4.09 MIL/uL — ABNORMAL LOW (ref 4.22–5.81)
RDW: 16.4 % — AB (ref 11.5–15.5)
WBC: 3.4 10*3/uL — AB (ref 4.0–10.5)

## 2017-06-02 LAB — GLUCOSE, CAPILLARY
GLUCOSE-CAPILLARY: 121 mg/dL — AB (ref 65–99)
GLUCOSE-CAPILLARY: 106 mg/dL — AB (ref 65–99)
GLUCOSE-CAPILLARY: 104 mg/dL — AB (ref 65–99)
Glucose-Capillary: 145 mg/dL — ABNORMAL HIGH (ref 65–99)

## 2017-06-02 LAB — BASIC METABOLIC PANEL
Anion gap: 6 (ref 5–15)
BUN: <5 mg/dL — ABNORMAL LOW (ref 6–20)
CALCIUM: 8 mg/dL — AB (ref 8.9–10.3)
CHLORIDE: 115 mmol/L — AB (ref 101–111)
CO2: 20 mmol/L — ABNORMAL LOW (ref 22–32)
CREATININE: 0.79 mg/dL (ref 0.61–1.24)
GFR calc non Af Amer: >60 mL/min (ref >60)
Glucose, Bld: 107 mg/dL — ABNORMAL HIGH (ref 65–99)
Potassium: 3.2 mmol/L — ABNORMAL LOW (ref 3.5–5.1)
SODIUM: 141 mmol/L (ref 135–145)

## 2017-06-02 LAB — AMMONIA: Ammonia: 42 umol/L — ABNORMAL HIGH (ref 9–35)

## 2017-06-02 LAB — MAGNESIUM
MAGNESIUM: 1.8 mg/dL (ref 1.7–2.4)
Magnesium: 1.4 mg/dL — ABNORMAL LOW (ref 1.7–2.4)

## 2017-06-02 MED ORDER — VITAL HIGH PROTEIN PO LIQD
1000.0000 mL | ORAL | Status: DC
  Administered 2017-06-02: 1000 mL

## 2017-06-02 MED ORDER — POTASSIUM CHLORIDE 20 MEQ/15ML (10%) PO SOLN
40.0000 meq | ORAL | Status: AC
  Administered 2017-06-02 (×3): 40 meq
  Filled 2017-06-02 (×3): qty 30

## 2017-06-02 MED ORDER — SODIUM CHLORIDE 0.9 % IV BOLUS (SEPSIS): 1000.0000 mL | Freq: Once | INTRAVENOUS | Status: AC

## 2017-06-02 MED ORDER — SODIUM CHLORIDE 0.9 % IV BOLUS (SEPSIS)
1000.0000 mL | Freq: Once | INTRAVENOUS | Status: AC
  Administered 2017-06-02: 1000 mL via INTRAVENOUS

## 2017-06-02 MED ORDER — PRO-STAT SUGAR FREE PO LIQD
30.0000 mL | Freq: Every day | ORAL | Status: DC
  Administered 2017-06-02 – 2017-06-03 (×5): 30 mL
  Filled 2017-06-02 (×5): qty 30

## 2017-06-02 MED ORDER — ADULT MULTIVITAMIN LIQUID CH
15.0000 mL | Freq: Every day | ORAL | Status: DC
  Administered 2017-06-03: 15 mL
  Filled 2017-06-02: qty 15

## 2017-06-02 MED ORDER — MAGNESIUM SULFATE 2 GM/50ML IV SOLN
2.0000 g | Freq: Once | INTRAVENOUS | Status: AC
  Administered 2017-06-02: 2 g via INTRAVENOUS
  Filled 2017-06-02: qty 50

## 2017-06-02 MED ORDER — PRO-STAT SUGAR FREE PO LIQD
30.0000 mL | Freq: Two times a day (BID) | ORAL | Status: DC
  Administered 2017-06-02: 30 mL
  Filled 2017-06-02: qty 30

## 2017-06-02 MED ORDER — VITAL HIGH PROTEIN PO LIQD
1000.0000 mL | ORAL | Status: DC
  Administered 2017-06-03: 16:00:00
  Administered 2017-06-03: 1000 mL
  Administered 2017-06-03: 15:00:00

## 2017-06-02 MED ORDER — LEVOTHYROXINE SODIUM 100 MCG IV SOLR: 50.0000 ug | Freq: Every day | INTRAVENOUS | Status: DC

## 2017-06-02 MED ORDER — LEVOTHYROXINE SODIUM 100 MCG IV SOLR: 25.0000 ug | Freq: Every day | INTRAVENOUS | Status: DC

## 2017-06-02 NOTE — Progress Notes (Signed)
Subjective: In burst suppression, still has epileptiform discharges in the bursts.   Exam: Vitals:   06/02/17 1430 06/02/17 1500  BP: 102/82 (!) 103/92  Pulse: 94 87  Resp: 12 12  Temp:    SpO2: 100% 100%   Gen: In bed, intubated Resp: ventilated Abd: soft  Neuro: Limited due to recent paralytic and sedation, PERRL.   Impression: 76 yo M with status epilepticus, now in burst suppression. I would favor another 24 hours of burst suppression, if still with epileptiform discharges in burst, may consider addition of another agent as we lighten sedation.   Recommendations: 1) continue Vimpat 200mg  Q12H 2) continue dilantin 100mg  TID 3) continue keppra 1500mg  BID.  4) Burst suppression again tonight.  5) dilantin level in the AM.  6) will follow.   This patient is critically ill and at significant risk of neurological worsening, death and care requires constant monitoring of vital signs, hemodynamics,respiratory and cardiac monitoring, neurological assessment, discussion with family, other specialists and medical decision making of high complexity. I spent 45 minutes of neurocritical care time  in the care of  this patient.  Roland Rack, MD [facility] [phone]  If 7pm- 7am, please page neurology on call as listed in [place]. 06/02/2017  3:20 PM

## 2017-06-02 NOTE — Progress Notes (Signed)
PULMONARY / CRITICAL CARE MEDICINE   Name: Nathan Frank MRN: [id number] DOB: 1940/11/29    ADMISSION DATE:  05/28/2017 CONSULTATION DATE:  05/31/17  REFERRING MD:  Dr. Erlinda Hong   CHIEF COMPLAINT:  Seizures, AMS  Brief:   76 y/o M admitted on 8/25 from the SNF with weakness and tremor in his left arm.  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  He carries a complex medical history including hepatitis C with alcoholic cirrhosis, intermittent bouts of hepatic encephalopathy, chronic thrombocytopenia, prior GIB, ongoing ETOH abuse, heroin abuse, chronic systolic CHF (LVEF 17-00%).    The patient returned 8/25 with concern for new focal seizures in the LUE.  He was admitted per [facility] for further evaluation.  UDS negative.  Neurology was consulted for evaluation.  He was treated with Depakote & Vimpat which seems to have controlled seizures.  In addition, he was medicated with ativan and klonopin.  Continuous EEG in progress.  On 8/27, he was noted to be sedate and PCCM consulted out of concern for airway protection.    SUBJECTIVE:  Intubated yesterday. Continues to have seizure activity on EEG.   VITAL SIGNS: BP 91/71   Pulse (!) 105   Temp 99.2 F (37.3 C) (Axillary)   Resp 12   Ht 5\' 11"  (1.803 m)   Wt 76.7 kg (169 lb 1.5 oz)   SpO2 100%   BMI 23.58 kg/m   VENTILATOR SETTINGS: Vent Mode: PRVC FiO2 (%):  [30 %] 30 % Set Rate:  [12 bmp] 12 bmp Vt Set:  [600 mL] 600 mL PEEP:  [5 cmH20] 5 cmH20 Plateau Pressure:  [18 cmH20-20 cmH20] 18 cmH20  INTAKE / OUTPUT: I/O last 3 completed shifts: In: 1892.7 [I.V.:1412.7; IV Piggyback:480] Out: 3150 [Urine:3150]  PHYSICAL EXAMINATION:  General Adult male, no distress  HEENT - MMM, ETT in place  Cardiac - Tachy, no MRG  Chest - Non-labored on vent, clear breath sounds  Abd - soft, non tender, active bowel sounds   Ext - no edema Skin - no rashes Neuro - sedated, does not follow commands, pupils intact     LABS:  BMET  Recent Labs Lab 05/31/17 0521 06/01/17 0324 06/02/17 0500  NA 141 139 141  K 3.6 3.2* 2.4*  CL 111 107 109  CO2 23 24 24   BUN <5* 5* 5*  CREATININE 0.84 0.72 0.85  GLUCOSE 79 100* 98    Electrolytes  Recent Labs Lab 05/28/17 2052  05/31/17 0521 06/01/17 0324 06/02/17 0500  CALCIUM 8.9  < > 8.3* 8.3* 8.4*  MG 1.5*  < > 2.0 1.6* 1.4*  PHOS 2.6  --   --   --  3.2  < > = values in this interval not displayed.  CBC  Recent Labs Lab 05/31/17 0521 06/01/17 0003 06/02/17 0500  WBC 2.4* 6.1 3.4*  HGB 12.4* 13.6 12.7*  HCT 34.7* 38.9* 35.5*  PLT 112* 121* 134*    Coag's No results for input(s): APTT, INR in the last 168 hours.  Sepsis Markers No results for input(s): LATICACIDVEN, PROCALCITON, O2SATVEN in the last 168 hours.  ABG  Recent Labs Lab 06/01/17 0405 06/01/17 1003  PHART 7.492* 7.582*  PCO2ART 31.7* 24.7*  PO2ART 71.6* 171.0*    Liver Enzymes  Recent Labs Lab 05/30/17 0956 05/31/17 0521 06/02/17 0500  AST 32 29 23  ALT 11* 10* 8*  ALKPHOS 49 48 49  BILITOT 1.3* 1.2 1.1  ALBUMIN 2.6* 2.4* 2.2*    Cardiac Enzymes No results for input(s): TROPONINI, PROBNP in the last 168 hours.  Glucose No results for input(s): GLUCAP in the last 168 hours.  Imaging Dg Chest Port 1 View  Result Date: 06/02/2017 CLINICAL DATA:  Respiratory failure EXAM: PORTABLE CHEST 1 VIEW COMPARISON:  06/01/2017 FINDINGS: Endotracheal tube tip just below the clavicular heads. An orogastric tube and side-port reaches the stomach. Right IJ central line with tip at the upper cavoatrial junction. Mild haziness of the left more than right lower chest, likely atelectasis and possibly trace fluid. No pneumothorax or air bronchogram. No pulmonary edema. Stable borderline heart size. IMPRESSION: 1. Stable positioning of tubes and central line. 2. Unchanged mild  atelectasis atelectasis at the bases. Possible trace pleural fluid on the left. Electronically Signed   By: Monte Fantasia M.D.   On: 06/02/2017 08:41   Dg Chest Port 1 View  Result Date: 06/01/2017 CLINICAL DATA:  Right central line placement EXAM: PORTABLE CHEST 1 VIEW COMPARISON:  Portable chest x-ray of 06/01/2017 FINDINGS: A right central venous line is now present with the tip overlying the lower SVC. No pneumothorax is seen. Mild basilar atelectasis is noted left-greater-than-right. Mediastinal and hilar contours are unremarkable. The tip of the endotracheal tube is approximately 4.9 cm above the carina. NG tube extends below the hemidiaphragm. Heart size is stable. A small left pleural effusion cannot be excluded. IMPRESSION: 1. Right central venous line tip overlies the lower SVC. No pneumothorax. 2. Mild basilar volume loss.  Question small left pleural effusion. 3. Tip of endotracheal tube 4.9 cm above the carina. Electronically Signed   By: Ivar Drape M.D.   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 There was some improvement in LPD activity with increased sedation.   CULTURES: Urine 8/23 > Negative  MRSA PCR 8/25 > Negative  Sputum 8/28 >>   ANTIBIOTICS: None.   SIGNIFICANT EVENTS: 8/25  Admit with focal seizure 8/27  PCCM consulted for sedation   LINES/TUBES:  DISCUSSION: 76 yo with altered mental status and compromised airway.  Hx of CVA, seizures.  ASSESSMENT / PLAN:  PULMONARY A: Respiratory Insufficieny in setting of seizures  P:   Vent Support No wean due to continued burst suppression needs Trend CXR   CARDIOVASCULAR A:  Hypotension in setting of propofol  Chronic Systolic HF  Hx of  non ischemic CM. P:  Cardiac Monitoring  Wean Levophed to maintain MAP >65   RENAL A:   Hypokalemia. Hypomagnesemia  P:   Trend BMP Replace electrolytes as needed   GASTROINTESTINAL A:   Hx of cirrhosis, Hep C P:   Trend LFT and ammonia  Scheduled lactulose  Start TF   HEMATOLOGIC A:   Thrombocytopenia. P:  Trend CBC   INFECTIOUS A:   Aspiration Event ?  P:   Trend WBC and fever curve  Follow culture data   ENDOCRINE A:   Hypothyroidism  P:   Trend Glucose  Restart home synthroid   NEUROLOGIC A:   Acute encephalopathy. Hx of seizures, CVA, ETOH P:   Neurology Following  Follow EEG AEDs per neuro Currently on  Propofol gtt for burst suppression  Continue Folic acid and thiamine  Vimpat, Keppra, and dilantin for neurology   No family at bedside   CC time 32 minutes  Hayden Pedro, AGACNP-BC [place] [facility]  Pgr: [phone]  PCCM Pgr: [phone]

## 2017-06-02 NOTE — Progress Notes (Signed)
MD aware UOP low, concentrated. CVP 4-5. 1L NS bolus ordered. Will continue to monitor.

## 2017-06-02 NOTE — Progress Notes (Signed)
CRITICAL VALUE ALERT  Critical Value:  K=2.4  Date & Time Notied:  06/02/17 0651  Provider Notified: CCM notified  Orders Received/Actions taken: Awaiting orders

## 2017-06-02 NOTE — Progress Notes (Signed)
[facility] ADULT ICU REPLACEMENT PROTOCOL FOR AM LAB REPLACEMENT ONLY  The patient does apply for the [facility] Adult ICU Electrolyte Replacment Protocol based on the criteria listed below:   1. Is GFR >/= 40 ml/min? Yes.    Patient's GFR today is >60 2. Is urine output >/= 0.5 ml/kg/hr for the last 6 hours? Yes.   Patient's UOP is 1.3 ml/kg/hr 3. Is BUN < 60 mg/dL? Yes.    Patient's BUN today is 5 4. Abnormal electrolyte  K 2.4 5. Ordered repletion with: per protocol 6. If a panic level lab has been reported, has the CCM MD in charge been notified? Yes.  .   Physician:  Zettie Pho 06/02/2017 6:54 AM

## 2017-06-02 NOTE — Procedures (Signed)
LTM-EEG Report  HISTORY: Continuous video-EEG monitoring performed for 76 year old with EPC. ACQUISITION: International 10-20 system for electrode placement; 18 channels with additional eyes linked to ipsilateral ears and EKG. Additional T1-T2 electrodes were used. Continuous video recording obtained.   EEG NUMBER:   MEDICATIONS:  Day 1: LEV, PHT, LCM, MDZ, propofol Day 2: LEV, PHT, LCM, MDZ, propofol  DAY #1: from 1238 05/31/17 to 0730 06/01/17  BACKGROUND: An overall medium voltage discontinuous recording with poor spontaneous variability and reactivity. Initially there was a waking background of a medium voltage theta-delta activity bilaterally with an intermittent 8Hz  posterior dominant rhythm over the left and maximal slowing over the right with continuous periodic activity as below. With increased sedation, waking background became absent with increased diffuse slow activity bilaterally. There were frequent brief periods of voltage attenuation which increased with increased sedation. EPILEPTIFORM/PERIODIC ACTIVITY: There were continuous lateralized periodic discharges (LPDs) in the right posterior quadrant with reflection over the left. There was superimposed fast activity over the discharges (LPD+ pattern). These discharges showed subtle ictal evolution initially with change in frequency from 2hz  to 0.5-1hz  with periods of brief attenuation. With increased sedation, these LPDs were more blunted and did not show discrete evolution. There was reported subtle LLE jerking/twitching at onset of the recording, however this was not apparent from the video.  SEIZURES: Subtle clinical seizures with LLE jerking and subtle ictal evolution of LPD pattern over the right, improved with increased sedation. EVENTS: Clinical seizures as above   DAY #2: from 0730 06/01/17 to 0730 06/02/17  BACKGROUND: An overall low voltage discontinuous recording with poor spontaneous variability and reactivity. The record  consisted primarily of a burst-suppression record after increased sedation was given. The bursts lasted 0.5-1 second duration with irregular theta activity bilaterally and some right posterior sharp waves. Periods of very low voltage suppression lasted 15-60 seconds.  EPILEPTIFORM/PERIODIC ACTIVITY: There were right posterior sharp waves in the bursts without ictal evolution.   SEIZURES: none EVENTS: none  EKG: no significant arrhythmia  SUMMARY: This was a markedly abnormal continuous video EEG due to an iatrogenic burst-suppression pattern with focal epileptiform discharges in the right posterior region. There were no seizures seen over the past 24 hours.

## 2017-06-02 NOTE — Progress Notes (Addendum)
Initial Nutrition Assessment  DOCUMENTATION CODES:   Non-severe (moderate) malnutrition in context of chronic illness  INTERVENTION:   Vital High Protein @ 20 ml/hr (480 ml/day) 30 ml Prostat five times per day MVI daily  Provides: 980 kcal, 117 grams protein, and 401 ml free water TF regimen and propofol at current rate providing 1708 total kcal/day (100 % of kcal needs)  NUTRITION DIAGNOSIS:   Malnutrition (Moderate) related to chronic illness (cirrhosis) as evidenced by mild depletion of body fat, mild depletion of muscle mass.  GOAL:   Patient will meet greater than or equal to 90% of their needs  MONITOR:   TF tolerance, Labs, Vent status  REASON FOR ASSESSMENT:   Consult, Ventilator Enteral/tube feeding initiation and management  ASSESSMENT:   Pt from SNF with PMH of substance abuse, alcohol cirrhosis, hepatic encephalopathy, CVA, residual L sided hemiparesis, recent admit for new onset sz. Pt admitted with seizures.    Pt discussed during ICU rounds and with RN.   8/28 pt became more lethargic and intubated MV: 7.6 L/min Temp (24hrs), Avg:98.1 F (36.7 C), Min:97.3 F (36.3 C), Max:99.2 F (37.3 C)  Propofol: 27.6 ml/hr provides: 728 kcal per day Medications reviewed and include: folvite, lactulose, 40 mEq KCl every 4 hours, thiamine  Labs reviewed: K+ 2.4 (L), PO4 3.2 WNL, Magnesium 1.4 (L), Ammonia 42 (H), TG: 138 CBG's: 145 Nutrition-Focused physical exam completed. Findings are mild/moderate orbital/upper arm fat depletion, severe (temple), mild/moderate clavicle, acromion and calf muscle depletion, and moderate edema.  Per chart review pt has lost 11% of his body weight in the last 5 months, unable to confirm this.    Diet Order:  Diet NPO time specified  Skin:  Reviewed, no issues  Last BM:  8/28 small  Height:   Ht Readings from Last 1 Encounters:  06/01/17 5\' 11"  (1.803 m)    Weight:   Wt Readings from Last 1 Encounters:  06/01/17 169  lb 1.5 oz (76.7 kg)    Ideal Body Weight:  78.1 kg  BMI:  Body mass index is 23.58 kg/m.  Estimated Nutritional Needs:   Kcal:  5364  Protein:  115-130 grams  Fluid:  > 1.7 L/day  EDUCATION NEEDS:   No education needs identified at this time  [place], [place], [place] Pager [phone] After Hours Pager

## 2017-06-02 NOTE — Progress Notes (Signed)
Patients urine output after bolus remains minimum, CVP 4. MD notified, new orders given and carried out. Will continue to monitor. Lianne Bushy RN BSN

## 2017-06-03 ENCOUNTER — Inpatient Hospital Stay (HOSPITAL_COMMUNITY): Payer: Medicare Other

## 2017-06-03 LAB — CBC
HCT: 37.4 % — ABNORMAL LOW (ref 39.0–52.0)
HEMOGLOBIN: 12.6 g/dL — AB (ref 13.0–17.0)
MCH: 30.1 pg (ref 26.0–34.0)
MCHC: 33.7 g/dL (ref 30.0–36.0)
MCV: 89.5 fL (ref 78.0–100.0)
Platelets: 116 10*3/uL — ABNORMAL LOW (ref 150–400)
RBC: 4.18 MIL/uL — ABNORMAL LOW (ref 4.22–5.81)
RDW: 17.1 % — ABNORMAL HIGH (ref 11.5–15.5)
WBC: 4.6 10*3/uL (ref 4.0–10.5)

## 2017-06-03 LAB — BASIC METABOLIC PANEL
ANION GAP: 6 (ref 5–15)
ANION GAP: 5 (ref 5–15)
BUN: 7 mg/dL (ref 6–20)
BUN: 9 mg/dL (ref 6–20)
CALCIUM: 8.2 mg/dL — AB (ref 8.9–10.3)
CHLORIDE: 116 mmol/L — AB (ref 101–111)
CO2: 19 mmol/L — AB (ref 22–32)
CO2: 19 mmol/L — AB (ref 22–32)
Calcium: 8.2 mg/dL — ABNORMAL LOW (ref 8.9–10.3)
Chloride: 115 mmol/L — ABNORMAL HIGH (ref 101–111)
Creatinine, Ser: 0.7 mg/dL (ref 0.61–1.24)
Creatinine, Ser: 0.65 mg/dL (ref 0.61–1.24)
GFR calc Af Amer: >60 mL/min (ref >60)
GFR calc Af Amer: >60 mL/min (ref >60)
GFR calc non Af Amer: >60 mL/min (ref >60)
GLUCOSE: 116 mg/dL — AB (ref 65–99)
GLUCOSE: 102 mg/dL — AB (ref 65–99)
POTASSIUM: 3.8 mmol/L (ref 3.5–5.1)
Potassium: 3.2 mmol/L — ABNORMAL LOW (ref 3.5–5.1)
Sodium: 140 mmol/L (ref 135–145)
Sodium: 140 mmol/L (ref 135–145)

## 2017-06-03 LAB — PHOSPHORUS
PHOSPHORUS: 3.4 mg/dL (ref 2.5–4.6)
PHOSPHORUS: 3.4 mg/dL (ref 2.5–4.6)

## 2017-06-03 LAB — GLUCOSE, CAPILLARY
GLUCOSE-CAPILLARY: 71 mg/dL (ref 65–99)
Glucose-Capillary: 109 mg/dL — ABNORMAL HIGH (ref 65–99)
Glucose-Capillary: 108 mg/dL — ABNORMAL HIGH (ref 65–99)
Glucose-Capillary: 96 mg/dL (ref 65–99)
Glucose-Capillary: 91 mg/dL (ref 65–99)
Glucose-Capillary: 89 mg/dL (ref 65–99)

## 2017-06-03 LAB — MAGNESIUM
MAGNESIUM: 1.8 mg/dL (ref 1.7–2.4)
MAGNESIUM: 1.5 mg/dL — AB (ref 1.7–2.4)

## 2017-06-03 LAB — CULTURE, RESPIRATORY

## 2017-06-03 LAB — PHENYTOIN LEVEL, TOTAL: PHENYTOIN LVL: 10.2 ug/mL (ref 10.0–20.0)

## 2017-06-03 LAB — CULTURE, RESPIRATORY W GRAM STAIN: Culture: NORMAL

## 2017-06-03 MED ORDER — PROPOFOL 1000 MG/100ML IV EMUL
65.0000 ug/kg/min | INTRAVENOUS | Status: DC
  Administered 2017-06-03 (×2): 65 ug/kg/min via INTRAVENOUS
  Administered 2017-06-03: 40 ug/kg/min via INTRAVENOUS
  Administered 2017-06-04: 20 ug/kg/min via INTRAVENOUS
  Administered 2017-06-04 (×2): 65 ug/kg/min via INTRAVENOUS
  Filled 2017-06-03 (×9): qty 100

## 2017-06-03 MED ORDER — POTASSIUM CHLORIDE 10 MEQ/50ML IV SOLN
10.0000 meq | INTRAVENOUS | Status: AC
Start: 2017-06-03 — End: 2017-06-03
  Administered 2017-06-03 (×2): 10 meq via INTRAVENOUS
  Filled 2017-06-03 (×2): qty 50

## 2017-06-03 MED ORDER — SODIUM CHLORIDE 0.9 % IV SOLN
INTRAVENOUS | Status: DC
  Administered 2017-06-05 – 2017-06-07 (×3): via INTRAVENOUS

## 2017-06-03 MED ORDER — POTASSIUM CHLORIDE 20 MEQ/15ML (10%) PO SOLN
30.0000 meq | ORAL | Status: AC
  Administered 2017-06-03 (×2): 30 meq
  Filled 2017-06-03 (×2): qty 30

## 2017-06-03 MED ORDER — LEVOTHYROXINE SODIUM 50 MCG PO TABS
50.0000 ug | ORAL_TABLET | Freq: Every day | ORAL | Status: DC
  Administered 2017-06-03: 50 ug
  Filled 2017-06-03: qty 1

## 2017-06-03 MED ORDER — PREGABALIN 75 MG PO CAPS
200.0000 mg | ORAL_CAPSULE | Freq: Three times a day (TID) | ORAL | Status: DC
  Administered 2017-06-04 – 2017-06-05 (×4): 200 mg via ORAL
  Filled 2017-06-03 (×4): qty 1

## 2017-06-03 MED ORDER — DEXTROSE 5 % IV SOLN
3.0000 g | Freq: Once | INTRAVENOUS | Status: AC
  Administered 2017-06-03: 3 g via INTRAVENOUS
  Filled 2017-06-03: qty 6

## 2017-06-03 MED ORDER — SODIUM CHLORIDE 0.9 % IV SOLN: 250.0000 mg | Freq: Once | INTRAVENOUS | Status: DC

## 2017-06-03 NOTE — Progress Notes (Signed)
Subjective: In burst suppression, still has epileptiform discharges in the bursts.   Exam: Vitals:   06/03/17 1230 06/03/17 1300  BP: 106/79 108/82  Pulse: 94 94  Resp: 13 12  Temp: (!) 97 F (36.1 C)   SpO2: 100% 100%   Gen: In bed, intubated Resp: ventilated Abd: soft  Neuro: Limited due to  sedation, PERRL.   Labs: Dilantin 10.2, adequate considering albumin is 2.2  Impression: 76 yo M with status epilepticus, now in burst suppression. I would favor another 24 hours of burst suppression, if still with epileptiform discharges in burst, may consider addition of another agent as we lighten sedation.   Recommendations: 1) continue Vimpat 200mg  Q12H 2) continue dilantin 100mg  TID 3) continue keppra 1500mg  BID.  4) will wean propofol.   This patient is critically ill and at significant risk of neurological worsening, death and care requires constant monitoring of vital signs, hemodynamics,respiratory and cardiac monitoring, neurological assessment, discussion with family, other specialists and medical decision making of high complexity. I spent 45 minutes of neurocritical care time  in the care of  this patient.  Roland Rack, MD [facility] [phone]  If 7pm- 7am, please page neurology on call as listed in [place]. 06/03/2017  1:23 PM

## 2017-06-03 NOTE — Progress Notes (Signed)
[facility] Progress Note Patient Name: Nathan Frank DOB: [birth year] MRN: [id number]   Date of Service  06/03/2017  HPI/Events of Note  Potassium 3.8 & magnesium 1.5. Phosphorus stable at 3.4. Normal renal function with creatinine 0.65. Has Central IV access.   eICU Interventions  1. KCl 10 mEq IV 2 runs 2. Magnesium sulfate 3 g IV 3. Repeat electrolytes in a.m. 4. Continuous telemetry monitoring 5. Tube feedings discontinued      Intervention Category Major Interventions: Electrolyte abnormality - evaluation and management  Tera Partridge 06/03/2017, 6:46 PM

## 2017-06-03 NOTE — Procedures (Signed)
LTM-EEG Report  HISTORY: Continuous video-EEG monitoring performed for 76year old with EPC. ACQUISITION: International 10-20 system for electrode placement; 18 channels with additional eyes linked to ipsilateral ears and EKG. Additional T1-T2 electrodes were used. Continuous video recording obtained.   EEG NUMBER:  MEDICATIONS:  Day 1: LEV, PHT, LCM, MDZ, propofol Day 2: LEV, PHT, LCM, MDZ, propofol Day 3: LEV, PHT, LCM, MDZ, propofol  DAY #1: from 1238 8/27/18to 0730 06/01/17 BACKGROUND: An overall medium voltage discontinuous recording with poorspontaneous variability and reactivity. Initially there was a waking background of a medium voltagetheta-delta activity bilaterally with an intermittent 8Hz  posterior dominant rhythm over the left and maximal slowing over the right with continuous periodic activity as below. With increased sedation, waking background became absent with increased diffuse slow activity bilaterally. There were frequent brief periods of voltage attenuation which increased with increased sedation. EPILEPTIFORM/PERIODIC ACTIVITY: There were continuous lateralized periodic discharges (LPDs) in the right posterior quadrant with reflection over the left. There was superimposed fast activity over the discharges (LPD+ pattern). These discharges showed subtle ictal evolution initially with change in frequency from 2hz  to 0.5-1hz  with periods of brief attenuation. With increased sedation, these LPDs were more blunted and did not show discrete evolution. There was reported subtle LLE jerking/twitching at onset of the recording, however this was not apparent from the video.  SEIZURES:Subtle clinical seizures with LLE jerking and subtle ictal evolution of LPD pattern over the right, improved with increased sedation. EVENTS:Clinical seizures as above  DAY #2: from 0730 8/28/18to 0730 06/02/17 BACKGROUND: An overall low voltage discontinuous recording with poorspontaneous  variability and reactivity. The record consisted primarily of a burst-suppression record after increased sedation was given. The bursts lasted 0.5-1 second duration with irregular theta activity bilaterally and some right posterior sharp waves. Periods of very low voltage suppression lasted 15-60 seconds.  EPILEPTIFORM/PERIODIC ACTIVITY: There were right posterior sharp waves in the bursts without ictal evolution.   SEIZURES: none EVENTS:none  DAY #3: from 0730 8/29/18to 0730 06/03/17 BACKGROUND: An overall low voltage discontinuous recording with poorspontaneous variability and reactivity. The record consisted primarily of a burst-suppression record after increased sedation was given. The bursts lasted 0.5-1 second duration with irregular theta activity bilaterally and some right posterior sharp waves initially. Periods of very low voltage suppression lasted 5-15 seconds.  EPILEPTIFORM/PERIODIC ACTIVITY: There were right posterior sharp waves in the bursts without ictal evolution which became less apparent over the course of the recording.   SEIZURES: none EVENTS:none  EKG: no significant arrhythmia  SUMMARY: This was a markedly abnormal continuous video EEG due to an iatrogenic burst-suppression pattern with focal epileptiform discharges in the right posterior region. There were no seizures seen over the past 48 hours.

## 2017-06-03 NOTE — Progress Notes (Signed)
Right sided PLEDs have resumed. I would favor adding an additional agent and resuming burst suppression for another 24 hours. For a different mechanism of action, I will add lyrica.   Roland Rack, MD [facility] [phone]  If 7pm- 7am, please page neurology on call as listed in [place].

## 2017-06-03 NOTE — Progress Notes (Signed)
[facility] Progress Note Patient Name: Nathan Frank DOB: 04/04/1941 MRN: [id number]   Date of Service  06/03/2017  HPI/Events of Note  Portable abdominal x-ray with mild small and large bowel distention likely reflecting ileus. No evidence of perforation. Hypokalemia this morning with magnesium 1.8 & normal phosphorus level.   eICU Interventions  1. Checking stat BMP & magnesium 2. Discontinuing lactulose 3. Continuing enteric feeding tube to low intermittent suction      Intervention Category Major Interventions: Other:  Tera Partridge 06/03/2017, 4:52 PM

## 2017-06-03 NOTE — Progress Notes (Signed)
PULMONARY / CRITICAL CARE MEDICINE   Name: Nathan Frank MRN: [id number] DOB: March 13, 1941    ADMISSION DATE:  05/28/2017 CONSULTATION DATE:  05/31/17  REFERRING MD:  Dr. Erlinda Hong   CHIEF COMPLAINT:  Seizures, AMS  PATIENT DESCRIPTION: 76 yo male admitted from SNF with weakness and Lt arm tremor.  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  He developed status epilepticus and altered mental status.  He was found to have elevate ammonia level.  He required intubation for airway protection.  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  SUBJECTIVE:  Remains on diprivan, pressors.   VITAL SIGNS: BP 118/83   Pulse 93   Temp (!) 97.5 F (36.4 C) (Oral)   Resp 12   Ht 5\' 11"  (1.803 m)   Wt 186 lb 9.6 oz (84.6 kg)   SpO2 100%   BMI 26.03 kg/m   VENTILATOR SETTINGS: Vent Mode: PRVC FiO2 (%):  [30 %-40 %] 30 % Set Rate:  [12 bmp] 12 bmp Vt Set:  [600 mL] 600 mL PEEP:  [5 cmH20] 5 cmH20 Plateau Pressure:  [17 cmH20-21 cmH20] 17 cmH20  INTAKE / OUTPUT: I/O last 3 completed shifts: In: 3623.7 [I.V.:2342; NG/GT:126.7; IV Piggyback:1155] Out: 1595 [Urine:1395; Stool:200]  PHYSICAL EXAMINATION:  General - sedated Eyes - pupils reactive ENT - ETT in place Cardiac - regular, no murmur Chest - no wheeze, rales Abd - soft, non tender Ext - no edema Skin - no rashes Neuro - RASS -4   LABS:  BMET  Recent Labs Lab 06/02/17 0500 06/02/17 1903 06/03/17 0353  NA 141 141 140  K 2.4* 3.2* 3.2*  CL 109 115* 115*  CO2 24 20* 19*  BUN 5* <5* 7  CREATININE 0.85 0.79 0.70  GLUCOSE 98 107* 116*    Electrolytes  Recent Labs Lab 06/02/17 0500 06/02/17 1903 06/03/17 0353  CALCIUM 8.4* 8.0* 8.2*  MG 1.4* 1.8 1.8  PHOS 3.2 2.6 3.4    CBC  Recent Labs Lab 06/01/17 0003 06/02/17 0500 06/03/17 0353  WBC 6.1 3.4* 4.6  HGB 13.6 12.7*  12.6*  HCT 38.9* 35.5* 37.4*  PLT 121* 134* 116*    Coag's No results for input(s): APTT, INR in the last 168 hours.  Sepsis Markers No results for input(s): LATICACIDVEN, PROCALCITON, O2SATVEN in the last 168 hours.  ABG  Recent Labs Lab 06/01/17 0405 06/01/17 1003  PHART 7.492* 7.582*  PCO2ART 31.7* 24.7*  PO2ART 71.6* 171.0*    Liver Enzymes  Recent Labs Lab 05/30/17 0956 05/31/17 0521 06/02/17 0500  AST 32 29 23  ALT 11* 10* 8*  ALKPHOS 49 48 49  BILITOT 1.3* 1.2 1.1  ALBUMIN 2.6* 2.4* 2.2*    Cardiac Enzymes No results for input(s): TROPONINI, PROBNP in the last 168 hours.  Glucose  Recent Labs Lab 06/02/17 1217 06/02/17 1525 06/02/17 1935 06/02/17 2326 06/03/17 0332  GLUCAP 145* 121* 106* 104* 109*    Imaging Dg Chest Port 1 View  Result Date: 06/03/2017 CLINICAL DATA:  Stroke, seizures EXAM: PORTABLE CHEST 1 VIEW COMPARISON:  06/02/2017 FINDINGS: Support devices are stable. Mild cardiomegaly and bibasilar atelectasis. Mild vascular congestion. No visible effusions or pneumothorax. IMPRESSION: Mild cardiomegaly and vascular congestion.  Bibasilar atelectasis. Electronically Signed   By: Rolm Baptise M.D.   On: 06/03/2017 07:17     STUDIES:  CT Head 8/24 >  old large right MCA infarct, no acute abnormality seen  UDS 8/24 > negative  Continuous EEG 8/27 > focal status with Lt leg jerking, discharges from Rt posterior quadrant   CULTURES: Urine 8/23 > Negative  MRSA PCR 8/25 > Negative  Sputum 8/28 >>   ANTIBIOTICS: None  SIGNIFICANT EVENTS: 8/25 Admit with seizures 8/27 to ICU 8/28 Burst suppression started  LINES/TUBES: ETT 8/28 > Rt IJ CVL 8/28 >  DISCUSSION: 76 yo with status epilepticus, hepatic encephalopathy and compromised airway.  Hx of CVA, seizures, cirrhosis, non ischemic CM.  ASSESSMENT / PLAN:  PULMONARY A: Compromised airway from altered mental status. P:   Full vent support until neuro status more stable   F/u CXR, ABG  CARDIOVASCULAR A:  Sedation induced hypotension. Non ischemic CM. P:  Pressors to keep MAP > 65  RENAL A:   Oliguria. Hypokalemia. Hypomagnesemia  P:   Replace as needed Increase IV fluids  GASTROINTESTINAL A:   Hx of Hep C/ETOH with cirrhosis. Nutrition. P:   F/u LFTs intermittently Thiamine, folic acid, MVI Tube feeds  HEMATOLOGIC A:   Thrombocytopenia 2nd to cirrhosis. P:  F/u CBC   INFECTIOUS A:   No evidence for infection.  P:   Monitor clinically  ENDOCRINE A:   Hx of hypothyroidism. P:   Synthroid  NEUROLOGIC A:   Status epilepticus. Hepatic encephalopathy. Recent hx of CVA. P:   Burst suppression, AEDs per neurology  CC time 32 minutes  [name], MD [place] 06/03/2017, 7:59 AM Pager:  [phone] After 3pm call: [phone]

## 2017-06-03 NOTE — Progress Notes (Signed)
Subjective: No longer has epiileptiform discharges in bursts  Exam: Vitals:   06/03/17 1512 06/03/17 1540  BP:  105/78  Pulse:    Resp:  16  Temp: 98.7 F (37.1 C)   SpO2:  100%   Gen: In bed, intubated Resp: ventilated Abd: soft  Neuro: Limited due to  sedation, PERRL.   Labs: Dilantin 10.2, adequate considering albumin is 2.2  Impression: 76 yo M with status epilepticus, now in burst suppression. I would favor another 24 hours of burst suppression, if still with epileptiform discharges in burst, may consider addition of another agent as we lighten sedation.   Recommendations: 1) continue Vimpat 200mg  Q12H 2) continue dilantin 100mg  TID 3) continue keppra 1500mg  BID.  4) will wean propofol.   This patient is critically ill and at significant risk of neurological worsening, death and care requires constant monitoring of vital signs, hemodynamics,respiratory and cardiac monitoring, neurological assessment, discussion with family, other specialists and medical decision making of high complexity. I spent 45 minutes of neurocritical care time  in the care of  this patient.  Roland Rack, MD [facility] [phone]  If 7pm- 7am, please page neurology on call as listed in [place]. 06/03/2017  3:55 PM

## 2017-06-03 NOTE — Progress Notes (Signed)
EEG maint. Done. No skin breakdown. Continue to monitor

## 2017-06-03 NOTE — Progress Notes (Signed)
Per Elink MD, continue NGT to LIS and hold meds via tube. Will notify neurology in regards to new order for Lyrica. Will continue to monitor. Also note now increased output via rectal pouch.

## 2017-06-03 NOTE — Progress Notes (Signed)
[facility] ADULT ICU REPLACEMENT PROTOCOL FOR AM LAB REPLACEMENT ONLY  The patient does apply for the [facility] Adult ICU Electrolyte Replacment Protocol based on the criteria listed below:   1. Is GFR >/= 40 ml/min? Yes.    Patient's GFR today is >60 2. Is urine output >/= 0.5 ml/kg/hr for the last 6 hours? Yes.   Patient's UOP is 0.53 ml/kg/hr 3. Is BUN < 60 mg/dL? Yes.    Patient's BUN today is 5 4. Abnormal electrolyte(s): K+=3.2 5. Ordered repletion with: 30 meq per tube x 2 doses 6. If a panic level lab has been reported, has the CCM MD in charge been notified? Yes.  .   Physician: Dolores Patty, Cindra Presume 06/03/2017 5:57 AM

## 2017-06-03 NOTE — Progress Notes (Addendum)
MD aware of vomiting episode. Order received to place NGT to LIS, stat KUB, and hold further doses of lactulose. Rectal pouch present with minimal output.

## 2017-06-03 NOTE — Progress Notes (Signed)
[facility] Progress Note Patient Name: Nathan Frank DOB: 13-May-1941 MRN: [id number]   Date of Service  06/03/2017  HPI/Events of Note  Notified by bedside nurse patient with nausea and vomiting after receiving lactulose. Tube feeds running. Patient is having some stool output.   eICU Interventions  1. Holding further lactulose 2. Stat abdominal x-ray portable 3. Enteric feeding tube to low intermittent suction      Intervention Category Intermediate Interventions: Other:  Tera Partridge 06/03/2017, 4:11 PM

## 2017-06-04 ENCOUNTER — Inpatient Hospital Stay (HOSPITAL_COMMUNITY): Payer: Medicare Other

## 2017-06-04 ENCOUNTER — Ambulatory Visit: Payer: Medicare Other | Admitting: Internal Medicine

## 2017-06-04 LAB — PHOSPHORUS: Phosphorus: 3.1 mg/dL (ref 2.5–4.6)

## 2017-06-04 LAB — CBC
HCT: 36 % — ABNORMAL LOW (ref 39.0–52.0)
HEMOGLOBIN: 12.4 g/dL — AB (ref 13.0–17.0)
MCH: 30.7 pg (ref 26.0–34.0)
MCHC: 34.4 g/dL (ref 30.0–36.0)
MCV: 89.1 fL (ref 78.0–100.0)
Platelets: 115 10*3/uL — ABNORMAL LOW (ref 150–400)
RBC: 4.04 MIL/uL — ABNORMAL LOW (ref 4.22–5.81)
RDW: 17.3 % — AB (ref 11.5–15.5)
WBC: 5 10*3/uL (ref 4.0–10.5)

## 2017-06-04 LAB — BLOOD GAS, ARTERIAL
Acid-base deficit: 4.6 mmol/L — ABNORMAL HIGH (ref 0.0–2.0)
BICARBONATE: 19 mmol/L — AB (ref 20.0–28.0)
FIO2: 30
LHR: 12 {breaths}/min
MECHVT: 600 mL
O2 Saturation: 91.7 %
PATIENT TEMPERATURE: 97.5
PCO2 ART: 28.2 mmHg — AB (ref 32.0–48.0)
PEEP: 5 cmH2O
PO2 ART: 57.6 mmHg — AB (ref 83.0–108.0)
pH, Arterial: 7.439 (ref 7.350–7.450)

## 2017-06-04 LAB — MAGNESIUM: Magnesium: 2.3 mg/dL (ref 1.7–2.4)

## 2017-06-04 LAB — BASIC METABOLIC PANEL
Anion gap: 5 (ref 5–15)
BUN: 9 mg/dL (ref 6–20)
CALCIUM: 8.2 mg/dL — AB (ref 8.9–10.3)
CHLORIDE: 114 mmol/L — AB (ref 101–111)
CO2: 19 mmol/L — AB (ref 22–32)
CREATININE: 0.69 mg/dL (ref 0.61–1.24)
GFR calc Af Amer: >60 mL/min (ref >60)
GFR calc non Af Amer: >60 mL/min (ref >60)
GLUCOSE: 100 mg/dL — AB (ref 65–99)
Potassium: 4.1 mmol/L (ref 3.5–5.1)
Sodium: 138 mmol/L (ref 135–145)

## 2017-06-04 LAB — GLUCOSE, CAPILLARY
GLUCOSE-CAPILLARY: 87 mg/dL (ref 65–99)
GLUCOSE-CAPILLARY: 88 mg/dL (ref 65–99)
GLUCOSE-CAPILLARY: 80 mg/dL (ref 65–99)
GLUCOSE-CAPILLARY: 84 mg/dL (ref 65–99)
GLUCOSE-CAPILLARY: 75 mg/dL (ref 65–99)
Glucose-Capillary: 82 mg/dL (ref 65–99)

## 2017-06-04 LAB — TRIGLYCERIDES: TRIGLYCERIDES: 110 mg/dL (ref <150)

## 2017-06-04 MED ORDER — FOLIC ACID 5 MG/ML IJ SOLN
1.0000 mg | Freq: Every day | INTRAMUSCULAR | Status: DC
  Administered 2017-06-04 – 2017-06-14 (×10): 1 mg via INTRAVENOUS
  Filled 2017-06-04 (×14): qty 0.2

## 2017-06-04 MED ORDER — THIAMINE HCL 100 MG/ML IJ SOLN
100.0000 mg | Freq: Every day | INTRAMUSCULAR | Status: DC
  Administered 2017-06-04 – 2017-06-15 (×12): 100 mg via INTRAVENOUS
  Filled 2017-06-04 (×12): qty 2

## 2017-06-04 MED ORDER — SODIUM CHLORIDE 0.9 % IV BOLUS (SEPSIS)
500.0000 mL | Freq: Once | INTRAVENOUS | Status: AC
  Administered 2017-06-04: 500 mL via INTRAVENOUS

## 2017-06-04 MED ORDER — METOPROLOL TARTRATE 5 MG/5ML IV SOLN
5.0000 mg | Freq: Once | INTRAVENOUS | Status: AC
  Administered 2017-06-04: 5 mg via INTRAVENOUS

## 2017-06-04 MED ORDER — METOPROLOL TARTRATE 5 MG/5ML IV SOLN
INTRAVENOUS | Status: AC
  Filled 2017-06-04: qty 5

## 2017-06-04 MED ORDER — SODIUM CHLORIDE 0.9 % IV SOLN
0.0000 ug/min | INTRAVENOUS | Status: DC
  Administered 2017-06-05: 25 ug/min via INTRAVENOUS
  Administered 2017-06-05: 50 ug/min via INTRAVENOUS
  Administered 2017-06-06 – 2017-06-08 (×4): 40 ug/min via INTRAVENOUS
  Filled 2017-06-04 (×7): qty 4

## 2017-06-04 MED ORDER — PANTOPRAZOLE SODIUM 40 MG IV SOLR
40.0000 mg | INTRAVENOUS | Status: DC
  Administered 2017-06-04 – 2017-06-15 (×12): 40 mg via INTRAVENOUS
  Filled 2017-06-04 (×12): qty 40

## 2017-06-04 MED ORDER — LEVOTHYROXINE SODIUM 100 MCG IV SOLR
25.0000 ug | Freq: Every day | INTRAVENOUS | Status: DC
  Administered 2017-06-04 – 2017-06-15 (×12): 25 ug via INTRAVENOUS
  Filled 2017-06-04 (×12): qty 5

## 2017-06-04 MED ORDER — HYALURONIDASE HUMAN 150 UNIT/ML IJ SOLN
150.0000 [IU] | INTRAMUSCULAR | Status: AC
  Administered 2017-06-04: 150 [IU] via SUBCUTANEOUS
  Filled 2017-06-04: qty 1

## 2017-06-04 MED ORDER — SODIUM CHLORIDE 0.9 % IV SOLN
0.0000 ug/min | INTRAVENOUS | Status: DC
  Administered 2017-06-04: 20 ug/min via INTRAVENOUS
  Administered 2017-06-04: 50 ug/min via INTRAVENOUS
  Filled 2017-06-04 (×3): qty 1

## 2017-06-04 MED ORDER — METOPROLOL TARTRATE 5 MG/5ML IV SOLN
2.5000 mg | Freq: Four times a day (QID) | INTRAVENOUS | Status: DC | PRN
  Administered 2017-06-04 – 2017-06-10 (×7): 2.5 mg via INTRAVENOUS
  Filled 2017-06-04 (×7): qty 5

## 2017-06-04 MED ORDER — SODIUM CHLORIDE 0.9 % IV BOLUS (SEPSIS)
1000.0000 mL | Freq: Once | INTRAVENOUS | Status: AC
  Administered 2017-06-04: 1000 mL via INTRAVENOUS

## 2017-06-04 NOTE — Progress Notes (Signed)
Subjective: I nburst suppression again overnight.   Exam: Vitals:   06/04/17 0600 06/04/17 0700  BP: 114/88 115/88  Pulse: 77 82  Resp: 12 12  Temp:    SpO2: 100% 100%   Gen: In bed, intubated Resp: ventilated Abd: soft  Neuro: Limited due to  sedation, PERRL.    Impression: 76 yo M with status epilepticus, now in burst suppression x 72 hours. I tried lightening sedation yesterday, but he began having periodic epileptiform discharges and therefore I started lyrica and suppressed again overnight. Unfortunately, this had to be held due to ileus, will try giving a dose today.   Recommendations: 1) continue Vimpat 200mg  Q12H 2) continue dilantin 100mg  TID 3) continue keppra 1500mg  BID.  4) continue lyrica 200mg  TID. 4 5) Wean sedation.   This patient is critically ill and at significant risk of neurological worsening, death and care requires constant monitoring of vital signs, hemodynamics,respiratory and cardiac monitoring, neurological assessment, discussion with family, other specialists and medical decision making of high complexity. I spent 40 minutes of neurocritical care time  in the care of  this patient.  Roland Rack, MD [facility] [phone]  If 7pm- 7am, please page neurology on call as listed in [place]. 06/04/2017  9:43 AM

## 2017-06-04 NOTE — Progress Notes (Signed)
PULMONARY / CRITICAL CARE MEDICINE   Name: AKARI CRYSLER MRN: [id number] DOB: 1941-03-04    ADMISSION DATE:  05/28/2017 CONSULTATION DATE:  05/31/17  REFERRING MD:  Dr. Erlinda Hong   CHIEF COMPLAINT:  Seizures, AMS  PATIENT DESCRIPTION: 76 yo male admitted from SNF with weakness and Lt arm tremor.  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  He developed status epilepticus and altered mental status.  He was found to have elevate ammonia level.  He required intubation for airway protection.  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  SUBJECTIVE:  Developed ileus.  VITAL SIGNS: BP 99/75   Pulse 86   Temp (!) 97.5 F (36.4 C) (Axillary) Comment: bair hugger placed  Resp 12   Ht 5\' 11"  (1.803 m)   Wt 188 lb 11.4 oz (85.6 kg)   SpO2 99%   BMI 26.32 kg/m   VENTILATOR SETTINGS: Vent Mode: PRVC FiO2 (%):  [30 %] 30 % Set Rate:  [12 bmp] 12 bmp Vt Set:  [600 mL] 600 mL PEEP:  [5 cmH20] 5 cmH20 Plateau Pressure:  [18 cmH20-20 cmH20] 19 cmH20  INTAKE / OUTPUT: I/O last 3 completed shifts: In: 3871.3 [I.V.:3360.3; NG/GT:196; IV Piggyback:315] Out: 2140 [Urine:1370; Emesis/NG output:250; Stool:520]  PHYSICAL EXAMINATION:  General - sedated Eyes - pupils reactive ENT - ETT in place Cardiac - regular, no murmur Chest - no wheeze, rales Abd - soft, mild distention Ext - no edema Skin - no rashes Neuro - RASS -4    LABS:  BMET  Recent Labs Lab 06/03/17 0353 06/03/17 1630 06/04/17 0406  NA 140 140 138  K 3.2* 3.8 4.1  CL 115* 116* 114*  CO2 19* 19* 19*  BUN 7 9 9   CREATININE 0.70 0.65 0.69  GLUCOSE 116* 102* 100*    Electrolytes  Recent Labs Lab 06/03/17 0353 06/03/17 1630 06/04/17 0406  CALCIUM 8.2* 8.2* 8.2*  MG 1.8 1.5* 2.3  PHOS 3.4 3.4 3.1    CBC  Recent Labs Lab 06/02/17 0500 06/03/17 0353 06/04/17 0406   WBC 3.4* 4.6 5.0  HGB 12.7* 12.6* 12.4*  HCT 35.5* 37.4* 36.0*  PLT 134* 116* 115*    Coag's No results for input(s): APTT, INR in the last 168 hours.  Sepsis Markers No results for input(s): LATICACIDVEN, PROCALCITON, O2SATVEN in the last 168 hours.  ABG  Recent Labs Lab 06/01/17 0405 06/01/17 1003 06/04/17 0357  PHART 7.492* 7.582* 7.439  PCO2ART 31.7* 24.7* 28.2*  PO2ART 71.6* 171.0* 57.6*    Liver Enzymes  Recent Labs Lab 05/30/17 0956 05/31/17 0521 06/02/17 0500  AST 32 29 23  ALT 11* 10* 8*  ALKPHOS 49 48 49  BILITOT 1.3* 1.2 1.1  ALBUMIN 2.6* 2.4* 2.2*    Cardiac Enzymes No results for input(s): TROPONINI, PROBNP in the last 168 hours.  Glucose  Recent Labs Lab 06/03/17 1230 06/03/17 1513 06/03/17 1936 06/03/17 2330 06/04/17 0328 06/04/17 0846  GLUCAP 71 96 91 89 87 88    Imaging Dg Chest Port 1 View  Result Date: 06/04/2017 CLINICAL DATA:  Respiratory failure. EXAM: PORTABLE CHEST 1 VIEW COMPARISON:  06/03/2017 . FINDINGS: Endotracheal tube, NG tube, right IJ line in stable position. Stable cardiomegaly. Mild left base infiltrate noted on today's exam. Small left pleural effusion . No pneumothorax. IMPRESSION: 1. Lines and tubes in stable position. 2. Mild left base  infiltrate noted on today's exam. Pneumonia cannot be excluded. Small left pleural effusion. 3.  Stable cardiomegaly. Electronically Signed   By: Marcello Moores  Register   On: 06/04/2017 07:21   Dg Abd Portable 1v  Result Date: 06/03/2017 CLINICAL DATA:  Hepatic cirrhosis, gastric ulcer, gallstones, GI bleed. EXAM: PORTABLE ABDOMEN - 1 VIEW COMPARISON:  Abdominal radiograph of June 01, 2017 FINDINGS: There is a moderate amount of gas within bowel which appears reflect large bowel. There is mild gaseous distention of a loop of small bowel in the mid abdomen. There is no free extraluminal gas. The esophagogastric tube is no longer teens and the proximal port and tip lie in the gastric body.  IMPRESSION: Mild small and large bowel distention which likely reflects an ileus. There is no evidence of perforation. Electronically Signed   By: David  Martinique M.D.   On: 06/03/2017 16:40     STUDIES:  CT Head 8/24 > old large right MCA infarct, no acute abnormality seen  UDS 8/24 > negative  Continuous EEG 8/27 > focal status with Lt leg jerking, discharges from Rt posterior quadrant   CULTURES: Urine 8/23 > Negative  MRSA PCR 8/25 > Negative  Sputum 8/28 >>   ANTIBIOTICS: None  SIGNIFICANT EVENTS: 8/25 Admit with seizures 8/27 to ICU 8/28 Burst suppression started  LINES/TUBES: ETT 8/28 > Rt IJ CVL 8/28 >  DISCUSSION: 76 yo with status epilepticus, hepatic encephalopathy and compromised airway.  Hx of CVA, seizures, cirrhosis, non ischemic CM.  ASSESSMENT / PLAN:  PULMONARY A: Compromised airway from altered mental status. P:   Full vent support  CARDIOVASCULAR A:  Sedation induced hypotension. Non ischemic CM. P:  Pressors to keep MAP > 65  RENAL A:   Oliguria. Hypokalemia. Hypomagnesemia  P:   Monitor electrolytes, urine outpt  GASTROINTESTINAL A:   Hx of Hep C/ETOH with cirrhosis. Nutrition. Ileus. 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 P:  F/u CBC  INFECTIOUS A:   No evidence for infection.  P:   Monitor clinically  ENDOCRINE A:   Hx of hypothyroidism. P:   Synthroid  NEUROLOGIC A:   Status epilepticus. Hepatic encephalopathy. Recent hx of CVA. P:   AEDS, sedation per neuro  CC time 31 minutes   [name], MD [place] 06/04/2017, 10:52 AM Pager:  [phone] After 3pm call: [phone]

## 2017-06-04 NOTE — Progress Notes (Addendum)
Called to bedside for possible IV infiltration with dilantin administration.   Right arm is blistered, weeping, and peeling of skin.   Patient given SQ hylenex. Will consult surgery to see if debridement is needed. > suggested wound care consult at this time.   Hayden Pedro, AGACNP-BC [facility]  Pgr: [phone]  PCCM Pgr: [phone]

## 2017-06-04 NOTE — Progress Notes (Addendum)
Called to patient bedside for elevation in HR  Has already received 1L bolus.   HR is 150-160. A.fib with RVR vs SVT. Given 5 mg IV metoprolol. HR now 80-90. EKG obtained > A.fib. If patient rate rises again will start Amiodarone gtt and monitor QTC closely as it is currently 500.   Hayden Pedro, AGACNP-BC [facility]  Pgr: [phone]  PCCM Pgr: [phone]

## 2017-06-04 NOTE — Progress Notes (Signed)
[facility] Progress Note Patient Name: Nathan Frank DOB: [birth year] MRN: [id number]   Date of Service  06/04/2017  HPI/Events of Note  EKG reviewed showing right bundle branch block. No discernible P waves. Suspect atrial flutter with 2 to one block.   eICU Interventions  1. Awaiting lab results 2. Lopressor 2.5 mg IV every 6 hours for increased heart rate      Intervention Category Major Interventions: Arrhythmia - evaluation and management  Tera Partridge 06/04/2017, 7:11 PM

## 2017-06-04 NOTE — Progress Notes (Signed)
1240 assessed R forearm IV. IV leaking, blistered, skin sloughing. Paged [facility] with concerns of medication infiltration. Antidote given per MD and pharmacy. IV removed. Leonel Ramsay, MD also notified and came to bedside. CCM NP also to bedside.Wound care consult placed.   1600 HR elevated. CCM NP at bedside. Orders given. Metoprolol given, Levo weaned and neo started. 12 lead obtained.   1815 HR elevated again. CCM notified. New orders placed. New 12 leak obtained and labs sent.

## 2017-06-04 NOTE — Progress Notes (Signed)
EEG maint complete. No skin breakdown. Continue to monitor 

## 2017-06-04 NOTE — Progress Notes (Deleted)
[facility] Progress Note Patient Name: Nathan Frank DOB: 1940-11-30 MRN: [id number]   Date of Service  06/04/2017  HPI/Events of Note  Patient admitted with acute on chronic hypoxia. Assessed by intensivist. April Manson shows patient talking to her nurse at bedside off nonrebreather mask. Appears morbidly obese.   eICU Interventions  Continuing plan of care as per intensivist      Intervention Category Evaluation Type: New Patient Evaluation  Tera Partridge 06/04/2017, 7:06 PM

## 2017-06-04 NOTE — Progress Notes (Signed)
Nutrition Follow-up  DOCUMENTATION CODES:   Non-severe (moderate) malnutrition in context of chronic illness  INTERVENTION:   As able recommend restart enteral nutrition support Vital High Protein @ 30 ml/hr (720 ml/day) 60 ml Prostat BID Provides: 1120 kcal, 123 grams protein, and 601 ml free water  TF regimen and propofol at current rate providing 1655 total kcal/day (97 % of kcal needs)  NUTRITION DIAGNOSIS:   Malnutrition (Moderate) related to chronic illness (cirrhosis) as evidenced by mild depletion of body fat, mild depletion of muscle mass. Ongoing.   GOAL:   Patient will meet greater than or equal to 90% of their needs Not met.   MONITOR:   TF tolerance, Labs, Vent status  ASSESSMENT:   Pt from SNF with PMH of substance abuse, alcohol cirrhosis, hepatic encephalopathy, CVA, residual L sided hemiparesis, recent admit for new onset sz. Pt admitted with seizures.   Pt discussed during ICU rounds and with RN.  8/30 pt with vomiting after lactulose administration, NG now to suction and TF discontinued. Per xray pt with ileus  Patient is currently intubated on ventilator support MV: 7.6 L/min Temp (24hrs), Avg:97.5 F (36.4 C), Min:96.9 F (36.1 C), Max:98.7 F (37.1 C)  Propofol: 20.3 ml/hr provides 535 kcal per day  Medications reviewed and include: folic acid, thiamine  Labs reviewed: Magnesium WNL after supplementation  Diet Order:  Diet NPO time specified  Skin:  Reviewed, no issues  Last BM:  8/28 small  Height:   Ht Readings from Last 1 Encounters:  06/01/17 5' 11"  (1.803 m)    Weight:   Wt Readings from Last 1 Encounters:  06/04/17 188 lb 11.4 oz (85.6 kg)    Ideal Body Weight:  78.1 kg  BMI:  Body mass index is 26.32 kg/m.  Estimated Nutritional Needs:   Kcal:  5520  Protein:  115-130 grams  Fluid:  > 1.7 L/day  EDUCATION NEEDS:   No education needs identified at this time  [place], [place], [place]  Pager [phone] After Hours Pager

## 2017-06-04 NOTE — Procedures (Signed)
LTM-EEG Report  HISTORY: Continuous video-EEG monitoring performed for 76year old with EPC. ACQUISITION: International 10-20 system for electrode placement; 18 channels with additional eyes linked to ipsilateral ears and EKG. Additional T1-T2 electrodes were used. Continuous video recording obtained.   EEG NUMBER:  MEDICATIONS:  Day 1: LEV, PHT, LCM, MDZ, propofol Day 2: LEV, PHT, LCM, MDZ, propofol Day 3: LEV, PHT, LCM, MDZ, propofol Day 4: LEV, PHT, LCM, MDZ, propofol decr  DAY #1: from 1238 8/27/18to 0730 06/01/17 BACKGROUND: An overall medium voltage discontinuous recording with poorspontaneous variability and reactivity. Initially there was a waking background of a medium voltagetheta-delta activity bilaterally with an intermittent 8Hz  posterior dominant rhythm over the left and maximal slowing over the right with continuous periodic activity as below. With increased sedation, waking background became absent with increased diffuse slow activity bilaterally. There were frequent brief periods of voltage attenuation which increased with increased sedation. EPILEPTIFORM/PERIODIC ACTIVITY: There were continuous lateralized periodic discharges (LPDs) in the right posterior quadrant with reflection over the left. There was superimposed fast activity over the discharges (LPD+ pattern). These discharges showed subtle ictal evolution initially with change in frequency from 2hz  to 0.5-1hz  with periods of brief attenuation. With increased sedation, these LPDs were more blunted and did not show discrete evolution. There was reported subtle LLE jerking/twitching at onset of the recording, however this was not apparent from the video.  SEIZURES:Subtle clinical seizures with LLE jerking and subtle ictal evolution of LPD pattern over the right, improved with increased sedation. EVENTS:Clinical seizures as above  DAY #2: from 07308/28/18to 0730 06/02/17 BACKGROUND: An overall low voltage  discontinuous recording with poorspontaneous variability and reactivity. The record consisted primarily of a burst-suppression record after increased sedation was given. The bursts lasted 0.5-1 second duration with irregular theta activity bilaterally and some right posterior sharp waves. Periods of very low voltage suppression lasted 15-60 seconds.  EPILEPTIFORM/PERIODIC ACTIVITY: There were right posterior sharp waves in the bursts without ictal evolution.  SEIZURES: none EVENTS:none  DAY #3: from 07308/29/18to 0730 06/03/17 BACKGROUND: An overall low voltage discontinuous recording with poorspontaneous variability and reactivity. The record consisted primarily of a burst-suppression record after increased sedation was given. The bursts lasted 0.5-1 second duration with irregular theta activity bilaterally and some right posterior sharp waves initially. Periods of very low voltage suppression lasted 5-15 seconds.  EPILEPTIFORM/PERIODIC ACTIVITY: There were right posterior sharp waves in the bursts without ictal evolution which became less apparent over the course of the recording.  SEIZURES: none EVENTS:none  DAY #4: from 07308/30/18to 0730 06/04/17 BACKGROUND: An overall low voltage discontinuous recording with poorspontaneous variability and reactivity. The record consisted primarily of a burst-suppression record after increased sedation was given. The bursts lasted 0.5-1 second duration with irregular theta activity bilaterally and rare right posterior sharp waves initially. Periods of very low voltage suppression lasted 5-10 seconds. There was a brief period in the afternoon of relatively decreased attenuation which correlated with decreased propofol. EPILEPTIFORM/PERIODIC ACTIVITY: There were right posterior sharp waves in the bursts without ictal evolution which were unchanged compared to the previous day's recording SEIZURES: none EVENTS:none  EKG: no significant  arrhythmia  SUMMARY: This was a markedly abnormal continuous video EEG due to an iatrogenic burst-suppression pattern with focal epileptiform discharges in the right posterior region. There was brief decrease in the burst-suppression pattern in the afternoon with decreased sedation. There were no seizures seen over the past 72 hours.

## 2017-06-05 ENCOUNTER — Inpatient Hospital Stay (HOSPITAL_COMMUNITY): Payer: Medicare Other

## 2017-06-05 DIAGNOSIS — J9600 Acute respiratory failure, unspecified whether with hypoxia or hypercapnia: Secondary | ICD-10-CM

## 2017-06-05 LAB — COMPREHENSIVE METABOLIC PANEL
ALK PHOS: 49 U/L (ref 38–126)
ALT: 8 U/L — ABNORMAL LOW (ref 17–63)
ANION GAP: 6 (ref 5–15)
AST: 23 U/L (ref 15–41)
Albumin: 2 g/dL — ABNORMAL LOW (ref 3.5–5.0)
BUN: 12 mg/dL (ref 6–20)
CALCIUM: 8.1 mg/dL — AB (ref 8.9–10.3)
CO2: 18 mmol/L — AB (ref 22–32)
Chloride: 115 mmol/L — ABNORMAL HIGH (ref 101–111)
Creatinine, Ser: 0.8 mg/dL (ref 0.61–1.24)
GFR calc non Af Amer: >60 mL/min (ref >60)
Glucose, Bld: 88 mg/dL (ref 65–99)
Potassium: 3.8 mmol/L (ref 3.5–5.1)
SODIUM: 139 mmol/L (ref 135–145)
Total Bilirubin: 1 mg/dL (ref 0.3–1.2)
Total Protein: 5.7 g/dL — ABNORMAL LOW (ref 6.5–8.1)

## 2017-06-05 LAB — CBC
HCT: 38.2 % — ABNORMAL LOW (ref 39.0–52.0)
HEMOGLOBIN: 12.9 g/dL — AB (ref 13.0–17.0)
MCH: 30.4 pg (ref 26.0–34.0)
MCHC: 33.8 g/dL (ref 30.0–36.0)
MCV: 89.9 fL (ref 78.0–100.0)
Platelets: 139 10*3/uL — ABNORMAL LOW (ref 150–400)
RBC: 4.25 MIL/uL (ref 4.22–5.81)
RDW: 17.4 % — ABNORMAL HIGH (ref 11.5–15.5)
WBC: 9.7 10*3/uL (ref 4.0–10.5)

## 2017-06-05 LAB — GLUCOSE, CAPILLARY
GLUCOSE-CAPILLARY: 75 mg/dL (ref 65–99)
GLUCOSE-CAPILLARY: 87 mg/dL (ref 65–99)
GLUCOSE-CAPILLARY: 91 mg/dL (ref 65–99)
Glucose-Capillary: 76 mg/dL (ref 65–99)
Glucose-Capillary: 82 mg/dL (ref 65–99)
Glucose-Capillary: 93 mg/dL (ref 65–99)

## 2017-06-05 LAB — MAGNESIUM: MAGNESIUM: 1.6 mg/dL — AB (ref 1.7–2.4)

## 2017-06-05 LAB — PHOSPHORUS: PHOSPHORUS: 3.2 mg/dL (ref 2.5–4.6)

## 2017-06-05 MED ORDER — POTASSIUM CHLORIDE 10 MEQ/100ML IV SOLN
INTRAVENOUS | Status: AC
  Filled 2017-06-05: qty 100

## 2017-06-05 MED ORDER — PREGABALIN 75 MG PO CAPS
200.0000 mg | ORAL_CAPSULE | Freq: Three times a day (TID) | ORAL | Status: DC
  Administered 2017-06-05 – 2017-06-06 (×3): 200 mg
  Filled 2017-06-05 (×3): qty 1

## 2017-06-05 MED ORDER — MAGNESIUM SULFATE 2 GM/50ML IV SOLN
2.0000 g | Freq: Once | INTRAVENOUS | Status: AC
  Administered 2017-06-05: 2 g via INTRAVENOUS
  Filled 2017-06-05: qty 50

## 2017-06-05 NOTE — Consult Note (Signed)
[facility] Nurse wound consult note Reason for Consult: Blistering, edema consistent with extravasation.  Was receiving IV Potassium and Dilantin.  This IV site (right forearm) was discontinued. Serum filled blistering and sloughing epithelium, partial thickness noted today.  Wound type: IV infiltration Pressure Injury POA: NA Measurement: 1 cm x 1 cm x 0.1 cm blistering present to right forearm and right antecubital/medial bend in arm.   Wound bed: pink and moist Drainage (amount, consistency, odor) minimal serous weeping Periwound: Edema to right arm, both arms are equally cool to touch.  Dressing procedure/placement/frequency: Cleanse right arm with soap and water and pat dry.  Apply Xeroform gauze to blisters.  Apply Telfa contact layer next and wrap with kerlix and tape.  Change daily.  Will not follow at this time.  Please re-consult if needed.  Domenic Moras RN BSN [place] Pager [phone]

## 2017-06-05 NOTE — Progress Notes (Signed)
LTM maintenenced; reprepped F8, F7, P3, Pz, and T3. No skin breakdown was seen.

## 2017-06-05 NOTE — Progress Notes (Signed)
PULMONARY / CRITICAL CARE MEDICINE   Name: Nathan Frank MRN: [id number] DOB: 1941-05-31    ADMISSION DATE:  05/28/2017 CONSULTATION DATE:  05/31/17  REFERRING MD:  Dr. Erlinda Hong   CHIEF COMPLAINT:  Seizures, AMS  PATIENT DESCRIPTION: 76 yo male admitted from SNF with weakness and Lt arm tremor.  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  He developed status epilepticus and altered mental status.  He was found to have elevate ammonia level.  He required intubation for airway protection.  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  SUBJECTIVE:  Tachycardia with a flutter overnight On [place] PRN  VITAL SIGNS: BP 123/78   Pulse 72   Temp 97.8 F (36.6 C) (Axillary)   Resp 12   Ht 5\' 11"  (1.803 m)   Wt 189 lb 9.5 oz (86 kg)   SpO2 99%   BMI 26.44 kg/m   VENTILATOR SETTINGS: Vent Mode: PRVC FiO2 (%):  [30 %] 30 % Set Rate:  [12 bmp] 12 bmp Vt Set:  [600 mL] 600 mL PEEP:  [5 cmH20] 5 cmH20 Plateau Pressure:  [19 cmH20-24 cmH20] 19 cmH20  INTAKE / OUTPUT: I/O last 3 completed shifts: In: 3664.5 [I.V.:3464.5; IV [id number]:607] Out: 2050 [Urine:1830; Emesis/NG output:100; Stool:120]  PHYSICAL EXAMINATION: Gen:      No acute distress HEENT:  EOMI, sclera anicteric, ETT Neck:     No masses; no thyromegaly Lungs:    Clear to auscultation bilaterally; normal respiratory effort CV:         Regular rate and rhythm; no murmurs Abd:      + bowel sounds; soft, non-tender; no palpable masses, no distension Ext:    No edema; adequate peripheral perfusion Skin:      Warm and dry; no rash Neuro: Sedated, intubated   LABS:  BMET  Recent Labs Lab 06/03/17 1630 06/04/17 0406 06/05/17 0420  NA 140 138 139  K 3.8 4.1 3.8  CL 116* 114* 115*  CO2 19* 19* 18*  BUN 9 9 12   CREATININE 0.65 0.69 0.80  GLUCOSE 102* 100* 88     Electrolytes  Recent Labs Lab 06/03/17 1630 06/04/17 0406 06/05/17 0420  CALCIUM 8.2* 8.2* 8.1*  MG 1.5* 2.3 1.6*  PHOS 3.4 3.1 3.2    CBC  Recent Labs Lab 06/03/17 0353 06/04/17 0406 06/05/17 0420  WBC 4.6 5.0 9.7  HGB 12.6* 12.4* 12.9*  HCT 37.4* 36.0* 38.2*  PLT 116* 115* 139*    Coag's No results for input(s): APTT, INR in the last 168 hours.  Sepsis Markers No results for input(s): LATICACIDVEN, PROCALCITON, O2SATVEN in the last 168 hours.  ABG  Recent Labs Lab 06/01/17 0405 06/01/17 1003 06/04/17 0357  PHART 7.492* 7.582* 7.439  PCO2ART 31.7* 24.7* 28.2*  PO2ART 71.6* 171.0* 57.6*    Liver Enzymes  Recent Labs Lab 05/31/17 0521 06/02/17 0500 06/05/17 0420  AST 29 23 23   ALT 10* 8* 8*  ALKPHOS 48 49 49  BILITOT 1.2 1.1 1.0  ALBUMIN 2.4* 2.2* 2.0*    Cardiac Enzymes No results for input(s): TROPONINI, PROBNP in the last 168 hours.  Glucose  Recent Labs Lab 06/04/17 0846 06/04/17 1152 06/04/17 1548 06/04/17 1913 06/04/17 2331 06/05/17 0319  GLUCAP 88 80 84 75 82 76    Imaging No results found.   STUDIES:  CT Head 8/24 > old large right  MCA infarct, no acute abnormality seen  UDS 8/24 > negative  Continuous EEG 8/27 > focal status with Lt leg jerking, discharges from Rt posterior quadrant   CULTURES: Urine 8/23 > Negative  MRSA PCR 8/25 > Negative  Sputum 8/28 >>   ANTIBIOTICS: None  SIGNIFICANT EVENTS: 8/25 Admit with seizures 8/27 to ICU 8/28 Burst suppression started  LINES/TUBES: ETT 8/28 > Rt IJ CVL 8/28 >  DISCUSSION: 76 yo with status epilepticus, hepatic encephalopathy and compromised airway.  Hx of CVA, seizures, cirrhosis, non ischemic CM.  ASSESSMENT / PLAN:  PULMONARY A: Compromised airway from altered mental status. P:   Continue full vent support as mental status is poor  CARDIOVASCULAR A:  Sedation induced hypotension. Non ischemic CM. P:  Telemetry monitoring Lopressor  prn  RENAL A:   Oliguria. Hypokalemia. Hypomagnesemia  P:   Replete Mg, Monitior urine output and creatinine  GASTROINTESTINAL A:   Hx of Hep C/ETOH with cirrhosis. Nutrition. Ileus. 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 improving  P:   follow CBC  INFECTIOUS A:   No evidence for infection.  P:   Observe off antibiotics  ENDOCRINE A:   Hx of hypothyroidism. P:   IV synthyroid  NEUROLOGIC A:   Status epilepticus. Hepatic encephalopathy. Recent hx of CVA. P:   AEDS, sedation per neuro  The patient is critically ill with multiple organ system failure and requires high complexity decision making for assessment and support, frequent evaluation and titration of therapies, advanced monitoring, review of radiographic studies and interpretation of complex data.   Critical Care Time devoted to patient care services, exclusive of separately billable procedures, described in this note is 35 minutes.   Marshell Garfinkel MD [facility] Pager [phone] If no answer or after 3pm call: [phone] 06/05/2017, 7:12 AM

## 2017-06-05 NOTE — Procedures (Signed)
LTM-EEG Report  HISTORY: Continuous video-EEG monitoring performed for 76year old with EPC. ACQUISITION: International 10-20 system for electrode placement; 18 channels with additional eyes linked to ipsilateral ears and EKG. Additional T1-T2 electrodes were used. Continuous video recording obtained.   EEG NUMBER:  MEDICATIONS:  Day 1: LEV, PHT, LCM, MDZ, propofol Day 2: LEV, PHT, LCM, MDZ, propofol Day 3: LEV, PHT, LCM, MDZ, propofol Day 4: LEV, PHT, LCM, MDZ, propofol decr Day 5: LEV, PHT, LCM  DAY #1: from 1238 8/27/18to 0730 06/01/17 BACKGROUND: An overall medium voltage discontinuous recording with poorspontaneous variability and reactivity. Initially there was a waking background of a medium voltagetheta-delta activity bilaterally with an intermittent 8Hz  posterior dominant rhythm over the left and maximal slowing over the right with continuous periodic activity as below. With increased sedation, waking background became absent with increased diffuse slow activity bilaterally. There were frequent brief periods of voltage attenuation which increased with increased sedation. EPILEPTIFORM/PERIODIC ACTIVITY: There were continuous lateralized periodic discharges (LPDs) in the right posterior quadrant with reflection over the left. There was superimposed fast activity over the discharges (LPD+ pattern). These discharges showed subtle ictal evolution initially with change in frequency from 2hz  to 0.5-1hz  with periods of brief attenuation. With increased sedation, these LPDs were more blunted and did not show discrete evolution. There was reported subtle LLE jerking/twitching at onset of the recording, however this was not apparent from the video.  SEIZURES:Subtle clinical seizures with LLE jerking and subtle ictal evolution of LPD pattern over the right, improved with increased sedation. EVENTS:Clinical seizures as above  DAY #2: from 07308/28/18to 0730 06/02/17 BACKGROUND: An  overall low voltage discontinuous recording with poorspontaneous variability and reactivity. The record consisted primarily of a burst-suppression record after increased sedation was given. The bursts lasted 0.5-1 second duration with irregular theta activity bilaterally and some right posterior sharp waves. Periods of very low voltage suppression lasted 15-60 seconds.  EPILEPTIFORM/PERIODIC ACTIVITY: There were right posterior sharp waves in the bursts without ictal evolution.  SEIZURES: none EVENTS:none  DAY #3: from 07308/29/18to 0730 06/03/17 BACKGROUND: An overall low voltage discontinuous recording with poorspontaneous variability and reactivity. The record consisted primarily of a burst-suppression record after increased sedation was given. The bursts lasted 0.5-1 second duration with irregular theta activity bilaterally and some right posterior sharp waves initially. Periods of very low voltage suppression lasted 5-15seconds.  EPILEPTIFORM/PERIODIC ACTIVITY: There were right posterior sharp waves in the bursts without ictal evolution which became less apparent over the course of the recording.  SEIZURES: none EVENTS:none  DAY #4: from 07308/30/18to 0730 06/04/17 BACKGROUND: An overall low voltage discontinuous recording with poorspontaneous variability and reactivity. The record consisted primarily of a burst-suppression record after increased sedation was given. The bursts lasted 0.5-1 second duration with irregular theta activity bilaterally and rare right posterior sharp waves initially. Periods of very low voltage suppression lasted 5-10seconds. There was a brief period in the afternoon of relatively decreased attenuation which correlated with decreased propofol. EPILEPTIFORM/PERIODIC ACTIVITY: There were right posterior sharp waves in the bursts without ictal evolution which were unchanged compared to the previous day's recording SEIZURES: none EVENTS:none  DAY #5: from  07308/31/18to 0715 06/05/17 BACKGROUND: An overall low voltage mostly continuous recording with somespontaneous variability and reactivity. The record consisted primarily of medium voltage delta-theta activity bilaterally with sparse superimposed faster frequencies. Periods of attenuation were seen initially which cleared as sedation was lifted. With increased background continuity, some reactivity and state changes were seen.  EPILEPTIFORM/PERIODIC ACTIVITY: As the background became  more continuous, there was an increase in right posterior sharp waves often in a quasi-periodic 0.5-1hz  pattern without ictal evolution SEIZURES: none EVENTS:none  EKG: no significant arrhythmia  SUMMARY: This was a markedly abnormal continuous video EEG due to diffuse slow activity and some iatrogenic attenuation. There was an increase in right posterior epileptiform activity as sedation was lifted, however no seizures were seen.

## 2017-06-06 ENCOUNTER — Inpatient Hospital Stay (HOSPITAL_COMMUNITY): Payer: Medicare Other

## 2017-06-06 LAB — BASIC METABOLIC PANEL
ANION GAP: 7 (ref 5–15)
BUN: 11 mg/dL (ref 6–20)
CALCIUM: 8.3 mg/dL — AB (ref 8.9–10.3)
CO2: 18 mmol/L — AB (ref 22–32)
Chloride: 116 mmol/L — ABNORMAL HIGH (ref 101–111)
Creatinine, Ser: 0.81 mg/dL (ref 0.61–1.24)
GLUCOSE: 85 mg/dL (ref 65–99)
POTASSIUM: 3.3 mmol/L — AB (ref 3.5–5.1)
Sodium: 141 mmol/L (ref 135–145)

## 2017-06-06 LAB — GLUCOSE, CAPILLARY
GLUCOSE-CAPILLARY: 63 mg/dL — AB (ref 65–99)
GLUCOSE-CAPILLARY: 84 mg/dL (ref 65–99)
Glucose-Capillary: 73 mg/dL (ref 65–99)
Glucose-Capillary: 63 mg/dL — ABNORMAL LOW (ref 65–99)
Glucose-Capillary: 70 mg/dL (ref 65–99)

## 2017-06-06 LAB — CBC
HEMATOCRIT: 37.1 % — AB (ref 39.0–52.0)
HEMOGLOBIN: 12.8 g/dL — AB (ref 13.0–17.0)
MCH: 30.4 pg (ref 26.0–34.0)
MCHC: 34.5 g/dL (ref 30.0–36.0)
MCV: 88.1 fL (ref 78.0–100.0)
Platelets: 158 10*3/uL (ref 150–400)
RBC: 4.21 MIL/uL — AB (ref 4.22–5.81)
RDW: 16.9 % — ABNORMAL HIGH (ref 11.5–15.5)
WBC: 6.4 10*3/uL (ref 4.0–10.5)

## 2017-06-06 LAB — MAGNESIUM: MAGNESIUM: 1.7 mg/dL (ref 1.7–2.4)

## 2017-06-06 LAB — PHENYTOIN LEVEL, TOTAL: PHENYTOIN LVL: 13.7 ug/mL (ref 10.0–20.0)

## 2017-06-06 LAB — PHOSPHORUS: PHOSPHORUS: 2.8 mg/dL (ref 2.5–4.6)

## 2017-06-06 MED ORDER — POTASSIUM CHLORIDE 10 MEQ/100ML IV SOLN
10.0000 meq | INTRAVENOUS | Status: AC
  Administered 2017-06-06 (×4): 10 meq via INTRAVENOUS
  Filled 2017-06-06 (×4): qty 100

## 2017-06-06 MED ORDER — PREGABALIN 50 MG PO CAPS
100.0000 mg | ORAL_CAPSULE | Freq: Three times a day (TID) | ORAL | Status: DC
  Administered 2017-06-06 – 2017-06-08 (×7): 100 mg
  Filled 2017-06-06 (×7): qty 2

## 2017-06-06 NOTE — Progress Notes (Signed)
Maintenenced LTM; reprepped and reglued all electrodes. No skin breakdown was seen.

## 2017-06-06 NOTE — Procedures (Signed)
LTM-EEG Report  HISTORY: Continuous video-EEG monitoring performed for 76year old with EPC. ACQUISITION: International 10-20 system for electrode placement; 18 channels with additional eyes linked to ipsilateral ears and EKG. Additional T1-T2 electrodes were used. Continuous video recording obtained.   EEG NUMBER:  MEDICATIONS:  Day 1: LEV, PHT, LCM, MDZ, propofol Day 2: LEV, PHT, LCM, MDZ, propofol Day 3: LEV, PHT, LCM, MDZ, propofol Day 4: LEV, PHT, LCM, MDZ, propofol decr Day 5: LEV, PHT, LCM Day 6: unchanged from day 5  DAY #1: from 1238 8/27/18to 0730 06/01/17 BACKGROUND: An overall medium voltage discontinuous recording with poorspontaneous variability and reactivity. Initially there was a waking background of a medium voltagetheta-delta activity bilaterally with an intermittent 8Hz  posterior dominant rhythm over the left and maximal slowing over the right with continuous periodic activity as below. With increased sedation, waking background became absent with increased diffuse slow activity bilaterally. There were frequent brief periods of voltage attenuation which increased with increased sedation. EPILEPTIFORM/PERIODIC ACTIVITY: There were continuous lateralized periodic discharges (LPDs) in the right posterior quadrant with reflection over the left. There was superimposed fast activity over the discharges (LPD+ pattern). These discharges showed subtle ictal evolution initially with change in frequency from 2hz  to 0.5-1hz  with periods of brief attenuation. With increased sedation, these LPDs were more blunted and did not show discrete evolution. There was reported subtle LLE jerking/twitching at onset of the recording, however this was not apparent from the video.  SEIZURES:Subtle clinical seizures with LLE jerking and subtle ictal evolution of LPD pattern over the right, improved with increased sedation. EVENTS:Clinical seizures as above  DAY #2: from 07308/28/18to 0730  06/02/17 BACKGROUND: An overall low voltage discontinuous recording with poorspontaneous variability and reactivity. The record consisted primarily of a burst-suppression record after increased sedation was given. The bursts lasted 0.5-1 second duration with irregular theta activity bilaterally and some right posterior sharp waves. Periods of very low voltage suppression lasted 15-60 seconds.  EPILEPTIFORM/PERIODIC ACTIVITY: There were right posterior sharp waves in the bursts without ictal evolution.  SEIZURES: none EVENTS:none  DAY #3: from 07308/29/18to 0730 06/03/17 BACKGROUND: An overall low voltage discontinuous recording with poorspontaneous variability and reactivity. The record consisted primarily of a burst-suppression record after increased sedation was given. The bursts lasted 0.5-1 second duration with irregular theta activity bilaterally and some right posterior sharp waves initially. Periods of very low voltage suppression lasted 5-15seconds.  EPILEPTIFORM/PERIODIC ACTIVITY: There were right posterior sharp waves in the bursts without ictal evolution which became less apparent over the course of the recording.  SEIZURES: none EVENTS:none  DAY #4: from 07308/30/18to 0730 06/04/17 BACKGROUND: An overall low voltage discontinuous recording with poorspontaneous variability and reactivity. The record consisted primarily of a burst-suppression record after increased sedation was given. The bursts lasted 0.5-1 second duration with irregular theta activity bilaterally and rareright posterior sharp waves initially. Periods of very low voltage suppression lasted 5-10seconds. There was a brief period in the afternoon of relatively decreased attenuation which correlated with decreased propofol. EPILEPTIFORM/PERIODIC ACTIVITY: There were right posterior sharp waves in the bursts without ictal evolution which were unchanged compared to the previous day's recording SEIZURES:  none EVENTS:none  DAY #5: from 07308/31/18to 0715 06/05/17 BACKGROUND: An overall low voltage mostly continuous recording with somespontaneous variability and reactivity. The record consisted primarily of medium voltage delta-theta activity bilaterally with sparse superimposed faster frequencies. Periods of attenuation were seen initially which cleared as sedation was lifted. With increased background continuity, some reactivity and state changes were seen.  EPILEPTIFORM/PERIODIC  ACTIVITY: As the background became more continuous, there was an increase in right posterior sharp waves often in a quasi-periodic 0.5-1hz  pattern without ictal evolution SEIZURES: none EVENTS:none  DAY #6: from 07159/1/18to 0730 06/06/17 BACKGROUND: An overall low voltage continuous recording with somespontaneous variability and reactivity. The record consisted primarily of medium voltage delta-theta activity bilaterally with sparse superimposed faster frequencies. Some reactivity and state changes were seen.  EPILEPTIFORM/PERIODIC ACTIVITY: There were occasional to frequent right posterior sharp waves often in short runs of quasi-periodic 0.5-1hz  pattern without ictal evolution. These were stable compared to the previous day's record. SEIZURES: none EVENTS:none  EKG: no significant arrhythmia  SUMMARY: This was a moderately abnormal continuous video EEG due to diffuse slow activity and focal epileptiform activity in the right posterior region. There were no seizures. Epileptiform activity and background were stable/unchanged over the past 24-48 hours.

## 2017-06-06 NOTE — Progress Notes (Signed)
PULMONARY / CRITICAL CARE MEDICINE   Name: Nathan Frank MRN: [id number] DOB: 1941-09-03    ADMISSION DATE:  05/28/2017 CONSULTATION DATE:  05/31/17  REFERRING MD:  Dr. Erlinda Hong   CHIEF COMPLAINT:  Seizures, AMS  PATIENT DESCRIPTION: 76 yo male admitted from SNF with weakness and Lt arm tremor.  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  He developed status epilepticus and altered mental status.  He was found to have elevate ammonia level.  He required intubation for airway protection.  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  SUBJECTIVE:  Tachycardia with a flutter overnight On [place] PRN  VITAL SIGNS: BP 117/72   Pulse 94   Temp (!) 97.5 F (36.4 C) (Axillary)   Resp 12   Ht 5\' 11"  (1.803 m)   Wt 190 lb 0.6 oz (86.2 kg)   SpO2 99%   BMI 26.50 kg/m   VENTILATOR SETTINGS: Vent Mode: PRVC FiO2 (%):  [30 %] 30 % Set Rate:  [12 bmp] 12 bmp Vt Set:  [600 mL] 600 mL PEEP:  [5 cmH20] 5 cmH20 Plateau Pressure:  [18 cmH20-19 cmH20] 18 cmH20  INTAKE / OUTPUT: I/O last 3 completed shifts: In: 2634.7 [I.V.:2584.7; IV Piggyback:50] Out: 2210 [Urine:2160; Stool:50]  PHYSICAL EXAMINATION: Gen:      No acute distress HEENT:  EOMI, sclera anicteric, ETT Neck:     No masses; no thyromegaly Lungs:    Clear to auscultation bilaterally; normal respiratory effort CV:         Regular rate and rhythm; no murmurs Abd:      + bowel sounds; soft, non-tender; no palpable masses, no distension Ext:    No edema; adequate peripheral perfusion Skin:      Warm and dry; no rash Neuro: Sedated, intubated   LABS:  BMET  Recent Labs Lab 06/04/17 0406 06/05/17 0420 06/06/17 0436  NA 138 139 141  K 4.1 3.8 3.3*  CL 114* 115* 116*  CO2 19* 18* 18*  BUN 9 12 11   CREATININE 0.69 0.80 0.81  GLUCOSE 100* 88 85    Electrolytes  Recent Labs Lab  06/04/17 0406 06/05/17 0420 06/06/17 0436  CALCIUM 8.2* 8.1* 8.3*  MG 2.3 1.6* 1.7  PHOS 3.1 3.2 2.8    CBC  Recent Labs Lab 06/04/17 0406 06/05/17 0420 06/06/17 0436  WBC 5.0 9.7 6.4  HGB 12.4* 12.9* 12.8*  HCT 36.0* 38.2* 37.1*  PLT 115* 139* 158    Coag's No results for input(s): APTT, INR in the last 168 hours.  Sepsis Markers No results for input(s): LATICACIDVEN, PROCALCITON, O2SATVEN in the last 168 hours.  ABG  Recent Labs Lab 06/01/17 0405 06/01/17 1003 06/04/17 0357  PHART 7.492* 7.582* 7.439  PCO2ART 31.7* 24.7* 28.2*  PO2ART 71.6* 171.0* 57.6*    Liver Enzymes  Recent Labs Lab 05/31/17 0521 06/02/17 0500 06/05/17 0420  AST 29 23 23   ALT 10* 8* 8*  ALKPHOS 48 49 49  BILITOT 1.2 1.1 1.0  ALBUMIN 2.4* 2.2* 2.0*    Cardiac Enzymes No results for input(s): TROPONINI, PROBNP in the last 168 hours.  Glucose  Recent Labs Lab 06/05/17 1203 06/05/17 1618 06/05/17 1921 06/05/17 2346 06/06/17 0345 06/06/17 0732  GLUCAP 82 93 87 91 73 63*    Imaging Dg Chest Port 1 View  Result Date: 06/06/2017 CLINICAL DATA:  Acute respiratory failure.  EXAM: PORTABLE CHEST 1 VIEW COMPARISON:  06/05/2017 FINDINGS: Endotracheal tube, NG tube, and right IJ line are stable. Heart is enlarged. There is no significant change an mild pulmonary vascular congestion and bibasilar atelectasis. Small effusions are likely present. IMPRESSION: 1. Stable low lung volumes and mild pulmonary vascular congestion. 2. Bibasilar airspace disease is unchanged. 3. Support apparatus is stable. Electronically Signed   By: San Morelle M.D.   On: 06/06/2017 07:06     STUDIES:  CT Head 8/24 > old large right MCA infarct, no acute abnormality seen  UDS 8/24 > negative  Continuous EEG 8/27 > focal status with Lt leg jerking, discharges from Rt posterior quadrant   CULTURES: Urine 8/23 > Negative  MRSA PCR 8/25 > Negative  Sputum 8/28 >>    ANTIBIOTICS: None  SIGNIFICANT EVENTS: 8/25 Admit with seizures 8/27 to ICU 8/28 Burst suppression started  LINES/TUBES: ETT 8/28 > Rt IJ CVL 8/28 >  DISCUSSION: 76 yo with status epilepticus, hepatic encephalopathy and compromised airway.  Hx of CVA, seizures, cirrhosis, non ischemic CM.  ASSESSMENT / PLAN:  PULMONARY A: Compromised airway from altered mental status. P:   Continue full vent support as mental status is poor  CARDIOVASCULAR A:  Sedation induced hypotension. Non ischemic CM. P:  Telemetry monitoring Lopressor prn  RENAL A:   Oliguria. Hypokalemia. Hypomagnesemia  P:   Replete K, Monitior urine output and creatinine  GASTROINTESTINAL A:   Hx of Hep C/ETOH with cirrhosis. Nutrition. Ileus. 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 improving  Coagulopathy. Secondary to cirrhosis P:  Follow CBC Repeat INR in AM  INFECTIOUS A:   No evidence for infection.  P:   Observe off antibiotics  ENDOCRINE A:   Hx of hypothyroidism. P:   IV synthyroid  NEUROLOGIC A:   Status epilepticus. Hepatic encephalopathy. Recent hx of CVA. P:   AEDS per neuro Off sedation  The patient is critically ill with multiple organ system failure and requires high complexity decision making for assessment and support, frequent evaluation and titration of therapies, advanced monitoring, review of radiographic studies and interpretation of complex data.   Critical Care Time devoted to patient care services, exclusive of separately billable procedures, described in this note is 35 minutes.   Marshell Garfinkel MD [facility] Pager [phone] If no answer or after 3pm call: [phone] 06/06/2017, 7:55 AM

## 2017-06-06 NOTE — Progress Notes (Signed)
Subjective: Despite having sedation paused now since 8/31, he continues to act very sedated.  Exam: Vitals:   06/06/17 1400 06/06/17 1500  BP: 127/87 117/87  Pulse: 74 77  Resp: 12 12  Temp:    SpO2: 100% 100%   Gen: In bed, intubated Resp: ventilated Abd: soft  Neuro: Pupils are equal round and reactive, he does not fixate or track, does not open eyes to noxious stimuli. He blinks his eyes to stimulation of his eyelashes.  He orients his head towards noxious stimuli and minimally withdrawals versus flexion to noxious stimuli in all 4 extremities. He has 2+ and symmetric reflexes.  Impression: 76 yo M who presented with partial status epilepticus, s/p burst suppression x 72 hours, propofol discontinued on 8/31. He continues to appear quite sedated, possible that some of the propofol is sticking around due to his hepatic dysfunction. It is also possible that his other antiepileptics are sedating him.  Recommendations: 1) continue Vimpat 200mg  Q12H 2) continue dilantin 100mg  TID 3) continue keppra 1500mg  BID.  4) decrease Lyrica to 100 mg 3 times a day 5) Dilantin level  Roland Rack, MD [facility] [phone]  If 7pm- 7am, please page neurology on call as listed in [place]. 06/06/2017  4:10 PM

## 2017-06-07 ENCOUNTER — Inpatient Hospital Stay (HOSPITAL_COMMUNITY): Payer: Medicare Other

## 2017-06-07 LAB — GLUCOSE, CAPILLARY
GLUCOSE-CAPILLARY: 84 mg/dL (ref 65–99)
GLUCOSE-CAPILLARY: 99 mg/dL (ref 65–99)
GLUCOSE-CAPILLARY: 109 mg/dL — AB (ref 65–99)
GLUCOSE-CAPILLARY: 113 mg/dL — AB (ref 65–99)
GLUCOSE-CAPILLARY: 110 mg/dL — AB (ref 65–99)
Glucose-Capillary: 64 mg/dL — ABNORMAL LOW (ref 65–99)
Glucose-Capillary: 67 mg/dL (ref 65–99)
Glucose-Capillary: 74 mg/dL (ref 65–99)
Glucose-Capillary: 82 mg/dL (ref 65–99)

## 2017-06-07 LAB — CBC
HEMATOCRIT: 38.7 % — AB (ref 39.0–52.0)
Hemoglobin: 13.2 g/dL (ref 13.0–17.0)
MCH: 30.6 pg (ref 26.0–34.0)
MCHC: 34.1 g/dL (ref 30.0–36.0)
MCV: 89.8 fL (ref 78.0–100.0)
PLATELETS: 194 10*3/uL (ref 150–400)
RBC: 4.31 MIL/uL (ref 4.22–5.81)
RDW: 17.1 % — AB (ref 11.5–15.5)
WBC: 6.5 10*3/uL (ref 4.0–10.5)

## 2017-06-07 LAB — BASIC METABOLIC PANEL
ANION GAP: 6 (ref 5–15)
BUN: 10 mg/dL (ref 6–20)
CO2: 18 mmol/L — AB (ref 22–32)
Calcium: 8.2 mg/dL — ABNORMAL LOW (ref 8.9–10.3)
Chloride: 116 mmol/L — ABNORMAL HIGH (ref 101–111)
Creatinine, Ser: 0.66 mg/dL (ref 0.61–1.24)
GLUCOSE: 77 mg/dL (ref 65–99)
POTASSIUM: 3.3 mmol/L — AB (ref 3.5–5.1)
Sodium: 140 mmol/L (ref 135–145)

## 2017-06-07 LAB — PROTIME-INR
INR: 1.2
PROTHROMBIN TIME: 15.1 s (ref 11.4–15.2)

## 2017-06-07 LAB — MAGNESIUM: Magnesium: 1.5 mg/dL — ABNORMAL LOW (ref 1.7–2.4)

## 2017-06-07 LAB — PHOSPHORUS: PHOSPHORUS: 2.8 mg/dL (ref 2.5–4.6)

## 2017-06-07 MED ORDER — VITAL HIGH PROTEIN PO LIQD: 1000.0000 mL | ORAL | Status: DC

## 2017-06-07 MED ORDER — SODIUM CHLORIDE 0.9 % IV BOLUS (SEPSIS)
500.0000 mL | Freq: Once | INTRAVENOUS | Status: AC
  Administered 2017-06-07: 500 mL via INTRAVENOUS

## 2017-06-07 MED ORDER — VITAL HIGH PROTEIN PO LIQD
1000.0000 mL | ORAL | Status: DC
  Administered 2017-06-07 – 2017-06-08 (×2): 1000 mL

## 2017-06-07 MED ORDER — POTASSIUM CHLORIDE 10 MEQ/100ML IV SOLN
10.0000 meq | INTRAVENOUS | Status: AC
  Administered 2017-06-07 (×4): 10 meq via INTRAVENOUS
  Filled 2017-06-07 (×4): qty 100

## 2017-06-07 MED ORDER — DEXTROSE-NACL 5-0.45 % IV SOLN
INTRAVENOUS | Status: DC
  Administered 2017-06-07 (×2): via INTRAVENOUS

## 2017-06-07 MED ORDER — PHENYTOIN SODIUM 50 MG/ML IJ SOLN
100.0000 mg | Freq: Three times a day (TID) | INTRAMUSCULAR | Status: DC
  Administered 2017-06-08 – 2017-06-11 (×11): 100 mg via INTRAVENOUS
  Filled 2017-06-07 (×11): qty 2

## 2017-06-07 MED ORDER — VITAL HIGH PROTEIN PO LIQD
1000.0000 mL | ORAL | Status: DC
  Administered 2017-06-07: 1000 mL

## 2017-06-07 MED ORDER — DEXTROSE 50 % IV SOLN
INTRAVENOUS | Status: AC
  Administered 2017-06-07: 25 mL
  Filled 2017-06-07: qty 50

## 2017-06-07 NOTE — Procedures (Signed)
LTM-EEG Report  HISTORY: Continuous video-EEG monitoring performed for 76year old with EPC. ACQUISITION: International 10-20 system for electrode placement; 18 channels with additional eyes linked to ipsilateral ears and EKG. Additional T1-T2 electrodes were used. Continuous video recording obtained.   EEG NUMBER:  MEDICATIONS:  Day 1: LEV, PHT, LCM, MDZ, propofol Day 2: LEV, PHT, LCM, MDZ, propofol Day 3: LEV, PHT, LCM, MDZ, propofol Day 4: LEV, PHT, LCM, MDZ, propofol decr Day 5: LEV, PHT, LCM Day 6: unchanged from day 5 Day 7: unchanged from day 6  DAY #1: from 1238 8/27/18to 0730 06/01/17 BACKGROUND: An overall medium voltage discontinuous recording with poorspontaneous variability and reactivity. Initially there was a waking background of a medium voltagetheta-delta activity bilaterally with an intermittent 8Hz  posterior dominant rhythm over the left and maximal slowing over the right with continuous periodic activity as below. With increased sedation, waking background became absent with increased diffuse slow activity bilaterally. There were frequent brief periods of voltage attenuation which increased with increased sedation. EPILEPTIFORM/PERIODIC ACTIVITY: There were continuous lateralized periodic discharges (LPDs) in the right posterior quadrant with reflection over the left. There was superimposed fast activity over the discharges (LPD+ pattern). These discharges showed subtle ictal evolution initially with change in frequency from 2hz  to 0.5-1hz  with periods of brief attenuation. With increased sedation, these LPDs were more blunted and did not show discrete evolution. There was reported subtle LLE jerking/twitching at onset of the recording, however this was not apparent from the video.  SEIZURES:Subtle clinical seizures with LLE jerking and subtle ictal evolution of LPD pattern over the right, improved with increased sedation. EVENTS:Clinical seizures as above  DAY  #2: from 07308/28/18to 0730 06/02/17 BACKGROUND: An overall low voltage discontinuous recording with poorspontaneous variability and reactivity. The record consisted primarily of a burst-suppression record after increased sedation was given. The bursts lasted 0.5-1 second duration with irregular theta activity bilaterally and some right posterior sharp waves. Periods of very low voltage suppression lasted 15-60 seconds.  EPILEPTIFORM/PERIODIC ACTIVITY: There were right posterior sharp waves in the bursts without ictal evolution.  SEIZURES: none EVENTS:none  DAY #3: from 07308/29/18to 0730 06/03/17 BACKGROUND: An overall low voltage discontinuous recording with poorspontaneous variability and reactivity. The record consisted primarily of a burst-suppression record after increased sedation was given. The bursts lasted 0.5-1 second duration with irregular theta activity bilaterally and some right posterior sharp waves initially. Periods of very low voltage suppression lasted 5-15seconds.  EPILEPTIFORM/PERIODIC ACTIVITY: There were right posterior sharp waves in the bursts without ictal evolution which became less apparent over the course of the recording.  SEIZURES: none EVENTS:none  DAY #4: from 07308/30/18to 0730 06/04/17 BACKGROUND: An overall low voltage discontinuous recording with poorspontaneous variability and reactivity. The record consisted primarily of a burst-suppression record after increased sedation was given. The bursts lasted 0.5-1 second duration with irregular theta activity bilaterally and rareright posterior sharp waves initially. Periods of very low voltage suppression lasted 5-10seconds. There was a brief period in the afternoon of relatively decreased attenuation which correlated with decreased propofol. EPILEPTIFORM/PERIODIC ACTIVITY: There were right posterior sharp waves in the bursts without ictal evolution which were unchanged compared to the previous day's  recording SEIZURES: none EVENTS:none  DAY #5: from 07308/31/18to 0715 06/05/17 BACKGROUND: An overall low voltage mostly continuous recording with somespontaneous variability and reactivity. The record consisted primarily of medium voltage delta-theta activity bilaterally with sparse superimposed faster frequencies. Periods of attenuation were seen initially which cleared as sedation was lifted. With increased background continuity, some reactivity and  state changes were seen.  EPILEPTIFORM/PERIODIC ACTIVITY: As the background became more continuous, there was an increase in right posterior sharp waves often in a quasi-periodic 0.5-1hz  pattern without ictal evolution SEIZURES: none EVENTS:none  DAY #6: from 07159/1/18to 0730 06/06/17 BACKGROUND: An overall low voltage continuous recording with somespontaneous variability and reactivity. The record consisted primarily of medium voltage delta-theta activity bilaterally with sparse superimposed faster frequencies. Some reactivity and state changes were seen.  EPILEPTIFORM/PERIODIC ACTIVITY: There were occasional to frequent right posterior sharp waves often in short runs of quasi-periodic 0.5-1hz  pattern without ictal evolution. These were stable compared to the previous day's record. SEIZURES: none EVENTS:none  DAY #7: from 07309/2/18to 0730 06/07/17 BACKGROUND: An overall low voltage continuous recording with somespontaneous variability and reactivity. The record consisted primarily of medium voltage delta-theta activity bilaterally with sparse superimposed faster frequencies. Some reactivity and state changes were seen. This background was unchanged compared to the previous day. EPILEPTIFORM/PERIODIC ACTIVITY: There were occasional right posterior sharp waves often in short runs of quasi-periodic 0.5-1hz  pattern without ictal evolution. These were stable compared to the previous day's record. SEIZURES: none EVENTS:none EKG: no  significant arrhythmia  SUMMARY: This was a moderately abnormal continuous video EEG due to diffuse slow activity and focal epileptiform activity in the right posterior region. There were no clinical or electrographic seizures seen since discontinuation of sedation. Epileptiform activity and background were stable/unchanged over the past several days.

## 2017-06-07 NOTE — Progress Notes (Signed)
PHARMACIST - PHYSICIAN COMMUNICATION  CONCERNING:  Phenytoin   RECOMMENDATION: Held doses for today  DESCRIPTION: Phenytoin level = 13.7, corrects to 27  Thank you Anette Guarneri, PharmD

## 2017-06-07 NOTE — Progress Notes (Signed)
PULMONARY / CRITICAL CARE MEDICINE   Name: Nathan Frank MRN: [id number] DOB: December 10, 1940    ADMISSION DATE:  05/28/2017 CONSULTATION DATE:  05/31/17  REFERRING MD:  Dr. Erlinda Hong   CHIEF COMPLAINT:  Seizures, AMS  PATIENT DESCRIPTION: 76 yo male admitted from SNF with weakness and Lt arm tremor.  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  He developed status epilepticus and altered mental status.  He was found to have elevate ammonia level.  He required intubation for airway protection.  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  SUBJECTIVE:  Off sedation over weekend but he is not waking up.   VITAL SIGNS: BP 92/71   Pulse 82   Temp 98.5 F (36.9 C) (Axillary)   Resp 12   Ht 5\' 11"  (1.803 m)   Wt 186 lb 1.1 oz (84.4 kg)   SpO2 100%   BMI 25.95 kg/m   VENTILATOR SETTINGS: Vent Mode: PRVC FiO2 (%):  [30 %] 30 % Set Rate:  [12 bmp] 12 bmp Vt Set:  [600 mL] 600 mL PEEP:  [5 cmH20] 5 cmH20 Plateau Pressure:  [17 cmH20-33 cmH20] 23 cmH20  INTAKE / OUTPUT: I/O last 3 completed shifts: In: 2743.2 [I.V.:2343.2; IV Piggyback:400] Out: 2200 [Urine:2200]  PHYSICAL EXAMINATION: Gen:      No acute distress HEENT:  EOMI, sclera anicteric Neck:     No masses; no thyromegaly, ETT Lungs:    Clear to auscultation bilaterally; normal respiratory effort CV:         Regular rate and rhythm; no murmurs Abd:      Mild distention, soft Ext:    No edema; adequate peripheral perfusion Skin:      Warm and dry; no rash Neuro: Unresponsive  LABS:  BMET  Recent Labs Lab 06/05/17 0420 06/06/17 0436 06/07/17 0418  NA 139 141 140  K 3.8 3.3* 3.3*  CL 115* 116* 116*  CO2 18* 18* 18*  BUN 12 11 10   CREATININE 0.80 0.81 0.66  GLUCOSE 88 85 77    Electrolytes  Recent Labs Lab 06/05/17 0420 06/06/17 0436 06/07/17 0418  CALCIUM 8.1* 8.3* 8.2*   MG 1.6* 1.7 1.5*  PHOS 3.2 2.8 2.8    CBC  Recent Labs Lab 06/05/17 0420 06/06/17 0436 06/07/17 0418  WBC 9.7 6.4 6.5  HGB 12.9* 12.8* 13.2  HCT 38.2* 37.1* 38.7*  PLT 139* 158 194    Coag's  Recent Labs Lab 06/07/17 0418  INR 1.20    Sepsis Markers No results for input(s): LATICACIDVEN, PROCALCITON, O2SATVEN in the last 168 hours.  ABG  Recent Labs Lab 06/01/17 0405 06/01/17 1003 06/04/17 0357  PHART 7.492* 7.582* 7.439  PCO2ART 31.7* 24.7* 28.2*  PO2ART 71.6* 171.0* 57.6*    Liver Enzymes  Recent Labs Lab 06/02/17 0500 06/05/17 0420  AST 23 23  ALT 8* 8*  ALKPHOS 49 49  BILITOT 1.1 1.0  ALBUMIN 2.2* 2.0*    Cardiac Enzymes No results for input(s): TROPONINI, PROBNP in the last 168 hours.  Glucose  Recent Labs Lab 06/06/17 2021 06/07/17 0030 06/07/17 0032 06/07/17 0056 06/07/17 0401 06/07/17 0734  GLUCAP 70 64* 67 82 74 84    Imaging Dg Chest Port 1 View  Result Date: 06/07/2017 CLINICAL DATA:  Acute respiratory failure. EXAM: PORTABLE CHEST 1 VIEW COMPARISON:  One-view chest x-ray 06/06/2017 FINDINGS: Endotracheal tube, NG  tube, and right IJ line are stable. Heart size is exaggerated by low lung volumes. Interstitial edema and bilateral effusions are stable. Bibasilar airspace disease likely reflects atelectasis. IMPRESSION: 1. Stable low lung volumes and mild pulmonary vascular congestion. 2. Stable bilateral pleural effusions and bibasilar airspace disease, likely atelectasis. 3. Support apparatus is stable. Electronically Signed   By: San Morelle M.D.   On: 06/07/2017 07:23     STUDIES:  CT Head 8/24 > old large right MCA infarct, no acute abnormality seen  UDS 8/24 > negative  Continuous EEG 8/27 > focal status with Lt leg jerking, discharges from Rt posterior quadrant   CULTURES: Urine 8/23 > Negative  MRSA PCR 8/25 > Negative  Sputum 8/28 >>   ANTIBIOTICS: None  SIGNIFICANT EVENTS: 8/25 Admit with  seizures 8/27 to ICU 8/28 Burst suppression started  LINES/TUBES: ETT 8/28 > Rt IJ CVL 8/28 >  DISCUSSION: 76 yo with status epilepticus, hepatic encephalopathy and compromised airway.  Hx of CVA, seizures, cirrhosis, non ischemic CM.  ASSESSMENT / PLAN:  PULMONARY A: Compromised airway from altered mental status. P:   Continue full vent support as mental status is poor  CARDIOVASCULAR A:  Hypotension. Non ischemic CM. P:  Telemetry monitoring Lopressor prn Still on neo with low CVP. Will bolus 500cc saline and then D5 drip  RENAL A:   Oliguria. Hypokalemia. Hypomagnesemia  P:   Replete K, Monitior urine output and creatinine  GASTROINTESTINAL A:   Hx of Hep C/ETOH with cirrhosis. Nutrition. Ileus > improving P:   Start trickle feeds Follow LFTs Thiamine, folic acid, MVI  HEMATOLOGIC A:   Thrombocytopenia 2nd to cirrhosis. improving   P:  Follow CBC  INFECTIOUS A:   No evidence for infection.  P:   Observe off antibiotics  ENDOCRINE A:   Hx of hypothyroidism. P:   IV synthyroid Recheck TSH  NEUROLOGIC A:   Status epilepticus. Hepatic encephalopathy. Recent hx of CVA. P:   AEDS per neuro Off sedation  The patient is critically ill with multiple organ system failure and requires high complexity decision making for assessment and support, frequent evaluation and titration of therapies, advanced monitoring, review of radiographic studies and interpretation of complex data.   Critical Care Time devoted to patient care services, exclusive of separately billable procedures, described in this note is 35 minutes.   Marshell Garfinkel MD [facility] Pager [phone] If no answer or after 3pm call: [phone] 06/07/2017, 7:40 AM

## 2017-06-07 NOTE — Progress Notes (Signed)
Subjective: Patient seen and examined. No acute events overnight. Currently hold onto continuous EEG. EEG dating from overnight showed diffuse slow activity and focal epileptiform activity in the right posterior region with no seizures O graphic or clinical since they discontinued a sedation. The EEG has been unchanged over past several days.  Exam: Vitals:   06/07/17 1100 06/07/17 1117  BP: 99/71 99/71  Pulse: 80 (!) 103  Resp: 12 12  Temp:    SpO2: 100% 100%   Gen: In bed, NAD Resp: non-labored breathing, no acute distress Abd: soft, nt  Neuro: On no sedation Intubated Does not open eyes to loud voice or noxious stimulus. Pupils are round reactive. Ocular cephalic present Corneals present Minimal withdrawal in all 4 extremity is. DTRs 2+  Pertinent Labs: Corrected phenytoin level 27  Impression:  76 year old man with a past medical history of an old stroke, seizures, presented with partial status epilepticus, was now been status post burst suppression for 3 days, and continues to be very heavily sedated with no meaningful response on exam. He has deranged hepatic function and it is possible that the propofol is still being metabolized and is causing sedating effects.  Recommendations: Continue Vimpat every 12 hours Continue Dilantin 100 mg 3 times a day once the levels become in the therapeutic range of 10-20. Dilantin on hold for today per pharmacy. Continue Keppra 1500 twice a day Continue with Lyrica 100 3 times a day Check Dilantin level in the morning We will continue to follow  Amie Portland, MD [facility] [phone]  If 7pm to 7am, please call on call as listed on AMION.

## 2017-06-08 LAB — PHENYTOIN LEVEL, TOTAL: PHENYTOIN LVL: 12.8 ug/mL (ref 10.0–20.0)

## 2017-06-08 LAB — BASIC METABOLIC PANEL
ANION GAP: 5 (ref 5–15)
BUN: 9 mg/dL (ref 6–20)
CO2: 18 mmol/L — ABNORMAL LOW (ref 22–32)
Calcium: 8.4 mg/dL — ABNORMAL LOW (ref 8.9–10.3)
Chloride: 115 mmol/L — ABNORMAL HIGH (ref 101–111)
Creatinine, Ser: 0.64 mg/dL (ref 0.61–1.24)
GFR calc Af Amer: >60 mL/min (ref >60)
GLUCOSE: 132 mg/dL — AB (ref 65–99)
Potassium: 3.5 mmol/L (ref 3.5–5.1)
Sodium: 138 mmol/L (ref 135–145)

## 2017-06-08 LAB — GLUCOSE, CAPILLARY
GLUCOSE-CAPILLARY: 104 mg/dL — AB (ref 65–99)
GLUCOSE-CAPILLARY: 112 mg/dL — AB (ref 65–99)
GLUCOSE-CAPILLARY: 123 mg/dL — AB (ref 65–99)
Glucose-Capillary: 142 mg/dL — ABNORMAL HIGH (ref 65–99)
Glucose-Capillary: 121 mg/dL — ABNORMAL HIGH (ref 65–99)
Glucose-Capillary: 116 mg/dL — ABNORMAL HIGH (ref 65–99)

## 2017-06-08 LAB — CBC
HCT: 37.3 % — ABNORMAL LOW (ref 39.0–52.0)
Hemoglobin: 12.7 g/dL — ABNORMAL LOW (ref 13.0–17.0)
MCH: 30.7 pg (ref 26.0–34.0)
MCHC: 34 g/dL (ref 30.0–36.0)
MCV: 90.1 fL (ref 78.0–100.0)
PLATELETS: 211 10*3/uL (ref 150–400)
RBC: 4.14 MIL/uL — ABNORMAL LOW (ref 4.22–5.81)
RDW: 17.3 % — AB (ref 11.5–15.5)
WBC: 7.7 10*3/uL (ref 4.0–10.5)

## 2017-06-08 LAB — MAGNESIUM: Magnesium: 1.3 mg/dL — ABNORMAL LOW (ref 1.7–2.4)

## 2017-06-08 LAB — PHOSPHORUS: Phosphorus: 3 mg/dL (ref 2.5–4.6)

## 2017-06-08 MED ORDER — MAGNESIUM SULFATE 4 GM/100ML IV SOLN
4.0000 g | Freq: Once | INTRAVENOUS | Status: AC
  Administered 2017-06-08: 4 g via INTRAVENOUS
  Filled 2017-06-08: qty 100

## 2017-06-08 MED ORDER — ETOMIDATE 2 MG/ML IV SOLN
40.0000 mg | Freq: Once | INTRAVENOUS | Status: AC
  Administered 2017-06-09: 20 mg via INTRAVENOUS

## 2017-06-08 MED ORDER — METOLAZONE 10 MG PO TABS
10.0000 mg | ORAL_TABLET | Freq: Once | ORAL | Status: AC
  Administered 2017-06-08: 10 mg via ORAL
  Filled 2017-06-08: qty 1

## 2017-06-08 MED ORDER — FENTANYL CITRATE (PF) 100 MCG/2ML IJ SOLN
200.0000 ug | Freq: Once | INTRAMUSCULAR | Status: AC
  Administered 2017-06-09: 100 ug via INTRAVENOUS
  Filled 2017-06-08: qty 4

## 2017-06-08 MED ORDER — POTASSIUM CHLORIDE 20 MEQ/15ML (10%) PO SOLN
40.0000 meq | Freq: Once | ORAL | Status: AC
  Administered 2017-06-08: 40 meq
  Filled 2017-06-08: qty 30

## 2017-06-08 MED ORDER — VITAL AF 1.2 CAL PO LIQD
1000.0000 mL | ORAL | Status: DC
  Administered 2017-06-08 – 2017-06-15 (×8): 1000 mL
  Filled 2017-06-08 (×5): qty 1000

## 2017-06-08 MED ORDER — VECURONIUM BROMIDE 10 MG IV SOLR
10.0000 mg | Freq: Once | INTRAVENOUS | Status: AC
  Administered 2017-06-09: 10 mg via INTRAVENOUS
  Filled 2017-06-08: qty 10

## 2017-06-08 MED ORDER — PRO-STAT SUGAR FREE PO LIQD
30.0000 mL | Freq: Two times a day (BID) | ORAL | Status: DC
  Administered 2017-06-09 – 2017-06-15 (×12): 30 mL
  Filled 2017-06-08 (×12): qty 30

## 2017-06-08 MED ORDER — MIDAZOLAM HCL 2 MG/2ML IJ SOLN
4.0000 mg | Freq: Once | INTRAMUSCULAR | Status: AC
  Administered 2017-06-09: 2 mg via INTRAVENOUS
  Filled 2017-06-08: qty 4

## 2017-06-08 MED ORDER — POTASSIUM CHLORIDE 10 MEQ/50ML IV SOLN
10.0000 meq | INTRAVENOUS | Status: AC
  Administered 2017-06-08 (×4): 10 meq via INTRAVENOUS
  Filled 2017-06-08 (×4): qty 50

## 2017-06-08 NOTE — Progress Notes (Signed)
[facility] Progress Note Patient Name: Nathan Frank DOB: [birth year] MRN: [id number]   Date of Service  06/08/2017  HPI/Events of Note  K+ = 3.5 and Creatinine = 0.64.   eICU Interventions  Will replace K+.     Intervention Category Major Interventions: Electrolyte abnormality - evaluation and management  Janice Seales [place] 06/08/2017, 6:46 AM

## 2017-06-08 NOTE — Progress Notes (Signed)
Progress note  Subjective Patient seen and examined this morning. No acute overnight events. Patient continued to be on video EEG overnight with no push button events. Continuous EEG report shows no evidence of seizure activity either clinical or subclinical. There is sharply contoured slowing in the right posterior cortex that may be suggestive of neuronal dysfunction in that region.  Objective Vitals:   06/08/17 1201 06/08/17 1300  BP: 94/64 92/64  Pulse: 90   Resp: 12 12  Temp:    SpO2: 98%    Gen: In bed, NAD Resp: non-labored breathing, no acute distress Abd: soft, nt  Neuro: On no sedation Intubated Does not open eyes to loud voice or noxious stimulus. Pupils are round reactive. Ocular cephalic present Corneals present Minimal withdrawal in all 4 extremity is. DTRs 2+   Current Facility-Administered Medications:  .  acetaminophen (TYLENOL) solution 650 mg, 650 mg, Per Tube, Q6H PRN, Halford Chessman, Vineet, MD .  chlorhexidine gluconate (MEDLINE KIT) (PERIDEX) 0.12 % solution 15 mL, 15 mL, Mouth Rinse, BID, Sood, Vineet, MD, 15 mL at 06/08/17 0734 .  Chlorhexidine Gluconate Cloth 2 % PADS 6 each, 6 each, Topical, Daily, [name], MD, 6 each at 06/08/17 0121 .  dextrose 5 %-0.45 % sodium chloride infusion, , Intravenous, Continuous, Mannam, Praveen, MD, Stopped at 06/08/17 0900 .  enoxaparin (LOVENOX) injection 80 mg, 1 mg/kg, Subcutaneous, Q12H, Florencia Reasons, MD, 80 mg at 06/08/17 1011 .  feeding supplement (VITAL HIGH PROTEIN) liquid 1,000 mL, 1,000 mL, Per Tube, Q24H, Sood, Vineet, MD, 1,000 mL at 06/08/17 1034 .  fentaNYL (SUBLIMAZE) injection 50 mcg, 50 mcg, Intravenous, Q2H PRN, Sood, Vineet, MD .  folic acid injection 1 mg, 1 mg, Intravenous, Daily, Sood, Vineet, MD, 1 mg at 06/08/17 1018 .  lacosamide (VIMPAT) 200 mg in sodium chloride 0.9 % 25 mL IVPB, 200 mg, Intravenous, Q12H, Greta Doom, MD, Stopped at 06/08/17 1221 .  levETIRAcetam (KEPPRA) 1,500 mg in  sodium chloride 0.9 % 100 mL IVPB, 1,500 mg, Intravenous, Q12H, Amie Portland, MD, Stopped at 06/08/17 1025 .  levothyroxine (SYNTHROID, LEVOTHROID) injection 25 mcg, 25 mcg, Intravenous, Daily, [name], MD, 25 mcg at 06/08/17 1016 .  MEDLINE mouth rinse, 15 mL, Mouth Rinse, 10 times per day, [name], MD, 15 mL at 06/08/17 1337 .  metoprolol tartrate (LOPRESSOR) injection 2.5 mg, 2.5 mg, Intravenous, Q6H PRN, Javier Glazier, MD, 2.5 mg at 06/08/17 0751 .  midazolam (VERSED) injection 1 mg, 1 mg, Intravenous, Q2H PRN, Halford Chessman, Vineet, MD .  [DISCONTINUED] ondansetron (ZOFRAN) tablet 4 mg, 4 mg, Oral, Q6H PRN **OR** ondansetron (ZOFRAN) injection 4 mg, 4 mg, Intravenous, Q6H PRN, Etta Quill, DO, 4 mg at 06/03/17 1548 .  pantoprazole (PROTONIX) injection 40 mg, 40 mg, Intravenous, Q24H, Sood, Vineet, MD, 40 mg at 06/08/17 1030 .  phenylephrine (NEO-SYNEPHRINE) 40 mg in sodium chloride 0.9 % 250 mL (0.16 mg/mL) infusion, 0-400 mcg/min, Intravenous, Titrated, Sood, Vineet, MD, Last Rate: 15 mL/hr at 06/08/17 0800, 40 mcg/min at 06/08/17 0800 .  phenytoin (DILANTIN) injection 100 mg, 100 mg, Intravenous, Q8H, Sood, Vineet, MD, 100 mg at 06/08/17 1333 .  potassium chloride 10 mEq in 50 mL *CENTRAL LINE* IVPB, 10 mEq, Intravenous, Q1 Hr x 4, Rush Farmer, MD, Last Rate: 50 mL/hr at 06/08/17 1333, 10 mEq at 06/08/17 1333 .  pregabalin (LYRICA) capsule 100 mg, 100 mg, Per Tube, TID, Greta Doom, MD, 100 mg at 06/07/17 2152 .  propofol (DIPRIVAN) 1000 MG/100ML  infusion, 65 mcg/kg/min, Intravenous, Titrated, Greta Doom, MD, Stopped at 06/04/17 1600 .  sodium chloride flush (NS) 0.9 % injection 10-40 mL, 10-40 mL, Intracatheter, Q12H, Sood, Vineet, MD, 10 mL at 06/08/17 1024 .  sodium chloride flush (NS) 0.9 % injection 10-40 mL, 10-40 mL, Intracatheter, PRN, [name], MD .  thiamine (B-1) injection 100 mg, 100 mg, Intravenous, Daily, Sood, Vineet, MD, 100 mg at  06/08/17 1016  CBC    Component Value Date/Time   WBC 7.7 06/08/2017 0502   RBC 4.14 (L) 06/08/2017 0502   HGB 12.7 (L) 06/08/2017 0502   HGB 13.4 01/01/2017 1535   HCT 37.3 (L) 06/08/2017 0502   HCT 38.9 01/01/2017 1535   PLT 211 06/08/2017 0502   PLT 148 (L) 01/01/2017 1535   MCV 90.1 06/08/2017 0502   MCV 94 01/01/2017 1535   MCH 30.7 06/08/2017 0502   MCHC 34.0 06/08/2017 0502   RDW 17.3 (H) 06/08/2017 0502   RDW 14.4 01/01/2017 1535   LYMPHSABS 1.3 05/28/2017 2052   MONOABS 0.6 05/28/2017 2052   EOSABS 0.0 05/28/2017 2052   BASOSABS 0.0 05/28/2017 2052   CMP     Component Value Date/Time   NA 138 06/08/2017 0502   NA 144 01/01/2017 1535   K 3.5 06/08/2017 0502   CL 115 (H) 06/08/2017 0502   CO2 18 (L) 06/08/2017 0502   GLUCOSE 132 (H) 06/08/2017 0502   BUN 9 06/08/2017 0502   BUN 6 (L) 01/01/2017 1535   CREATININE 0.64 06/08/2017 0502   CREATININE 0.72 12/13/2015 1226   CALCIUM 8.4 (L) 06/08/2017 0502   PROT 5.7 (L) 06/05/2017 0420   PROT 7.1 01/01/2017 1535   ALBUMIN 2.0 (L) 06/05/2017 0420   ALBUMIN 2.9 (L) 01/01/2017 1535   AST 23 06/05/2017 0420   ALT 8 (L) 06/05/2017 0420   ALKPHOS 49 06/05/2017 0420   BILITOT 1.0 06/05/2017 0420   BILITOT 1.2 01/01/2017 1535   GFRNONAA >60 06/08/2017 0502   GFRAA >60 06/08/2017 0502    Phenytoin level today-12.8 No albumin drawn today. Albumin from 06/05/2017-2.0 Corrected phenytoin level-25.6  Impression 76 year old man with a past medical history of prior stroke, seizures, presented with partial status epilepticus, has now been status post burst suppression 3 days, off of sedation for the past 2-3 days and continues to be unresponsive with any meaningful response on examination. It is possible that his deranged hepatic function might be contributing to metabolizing sedating medications such as propofol and limiting the exam. Next line  Recommendations Recommendations: Continue Vimpat every 12 hours Continue  Dilantin 100 mg 3 times a day once the levels become in the therapeutic range of 10-20. Dilantin on hold for today per pharmacy. Continue Keppra 1500 twice a day Continue with Lyrica 100 3 times a day Check Dilantin level in the morning We will continue to follow  Amie Portland, MD [facility] [phone]  If 7pm to 7am, please call on call as listed on AMION.

## 2017-06-08 NOTE — Procedures (Signed)
  Electroencephalogram report- LTM   Data acquisition: 10-20 electrode placement.  Additional T1, T2, and EKG electrodes; 26 channel digital referential acquisition reformatted to 18 channel/7 channel coronal bipolar    Beginning time: 05/07/17 at 08 34 am  Ending time: 05/08/17 at 08 12 am   CPT: 95951  This 24 hours of intensive EEG monitoring with simultaneous video monitoring was performed for this patient with Status epilepticus to assess for resolution of clinical and subclinical electrographic seizures.  Medications:as per EMR  There was no pushbutton activations events during this recording.  Automated spike detection program did not detect any spikes. Seizure detection program did not detect any seizures .   Background activities marked by attenuated background activities distributed broadly without significant interhemispheric asymmetries.  Faster frequencies tend to emerge intermittently reaching 7-8 cps was more anterior dominance.  There is a muscle artifact also intermittently present.  There was no interictal epileptiform discharges no clinical subclinical seizures present  There is occasionally sharply contoured slowing noted in the right posterior cortex however again without interictal epileptiform discharges or seizures.  Clinical interpretation:  this 24 hours of intensive EEG monitoring with simultaneous video monitoring did not record any clinical subclinical seizures. There was no interictal epileptiform discharges present.  Background activities finding suggestive of encephalopathy of nonspecific etiologies however sedation may contribute to these findings.  Sharply contoured slowing in the right posterior cortex may be suggestive of neuronal dysfunction in that region. Clinical correlation is advised

## 2017-06-08 NOTE — Progress Notes (Signed)
LTM discontinued; mild skin breakdown at Fp1, F7, and F8.

## 2017-06-08 NOTE — Progress Notes (Signed)
Nutrition Follow-up  DOCUMENTATION CODES:   Non-severe (moderate) malnutrition in context of chronic illness  INTERVENTION:   D/C Vital High Protein  Start Vital AF 1.2 @ 20 ml/hr  9/5 advance TF to 55 ml/hr (1320 ml/day) Add 30 ml Prostat BID Provides: 1784 kcal, 129 grams protein, and 1070 ml free water.    NUTRITION DIAGNOSIS:   Malnutrition (Moderate) related to chronic illness (cirrhosis) as evidenced by mild depletion of body fat, mild depletion of muscle mass. Ongoing.   GOAL:   Patient will meet greater than or equal to 90% of their needs Progressing.   MONITOR:   TF tolerance, Labs, Vent status  ASSESSMENT:   Pt from SNF with PMH of substance abuse, alcohol cirrhosis, hepatic encephalopathy, CVA, residual L sided hemiparesis, recent admit for new onset sz. Pt admitted with seizures.   Pt discussed during ICU rounds and with RN.  9/3 TF resumed, per MD adv to 20 then to goal 9/5 Plan for trach tomorrow  Patient is currently intubated on ventilator support MV: 7.6 L/min Temp (24hrs), Avg:97.8 F (36.6 C), Min:97.5 F (36.4 C), Max:99 F (37.2 C)  Medications reviewed and include: folic acid, thiamine, 10 mEq KCl x 4, phenylephrine  IVF: D51/2NS @ 75 ml/hr  Labs reviewed: magnesium 1.3 (L) CBG's: 104-112-123   Diet Order:  Diet NPO time specified  Skin:  Reviewed, no issues  Last BM:  9/4 small  Height:   Ht Readings from Last 1 Encounters:  06/01/17 5\' 11"  (1.803 m)    Weight:   Wt Readings from Last 1 Encounters:  06/08/17 192 lb 3.9 oz (87.2 kg)    Ideal Body Weight:  78.1 kg  BMI:  Body mass index is 26.81 kg/m.  Estimated Nutritional Needs:   Kcal:  0102  Protein:  115-130 grams  Fluid:  > 1.7 L/day  EDUCATION NEEDS:   No education needs identified at this time  [place], [place], [place] Pager [phone] After Hours Pager

## 2017-06-08 NOTE — Progress Notes (Signed)
PULMONARY / CRITICAL CARE MEDICINE   Name: Nathan Frank MRN: [id number] DOB: 07-Oct-1940    ADMISSION DATE:  05/28/2017 CONSULTATION DATE:  05/31/17  REFERRING MD:  Dr. Erlinda Hong   CHIEF COMPLAINT:  Seizures, AMS  PATIENT DESCRIPTION: 76 yo male admitted from SNF with weakness and Lt arm tremor.  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  He developed status epilepticus and altered mental status.  He was found to have elevate ammonia level.  He required intubation for airway protection.  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  SUBJECTIVE:  Off sedation with no response, VT this AM that resolved spontaneously.  VITAL SIGNS: BP (!) 120/94   Pulse 92   Temp 97.7 F (36.5 C) (Axillary)   Resp 14   Ht 5\' 11"  (1.803 m)   Wt 87.2 kg (192 lb 3.9 oz)   SpO2 100%   BMI 26.81 kg/m    VENTILATOR SETTINGS: Vent Mode: PRVC FiO2 (%):  [30 %] 30 % Set Rate:  [12 bmp] 12 bmp Vt Set:  [600 mL] 600 mL PEEP:  [5 cmH20] 5 cmH20 Plateau Pressure:  [19 cmH20-23 cmH20] 23 cmH20  INTAKE / OUTPUT: I/O last 3 completed shifts: In: 3996.7 [I.V.:3367.5; NG/GT:229.2; IV Piggyback:400] Out: 975 [Urine:975]  PHYSICAL EXAMINATION: Gen:      Unresponsive, off sedation HEENT:  [place]/AT, PERRL, EOM-I and MMM Neck:      ETT in good position Lungs:    CTA bilaterally CV:         RRR, Nl S1/S2, -M/R/G. Abd:      Soft, NT, ND and +BS Ext:    2+ edema and -tenderness Skin:      Warm and dry; no rash Neuro:   Unresponsive  LABS:  BMET  Recent Labs Lab 06/06/17 0436 06/07/17 0418 06/08/17 0502  NA 141 140 138  K 3.3* 3.3* 3.5  CL 116* 116* 115*  CO2 18* 18* 18*  BUN 11 10 9   CREATININE 0.81 0.66 0.64  GLUCOSE 85 77 132*   Electrolytes  Recent Labs Lab 06/06/17 0436 06/07/17 0418 06/08/17 0502  CALCIUM 8.3* 8.2* 8.4*  MG 1.7 1.5* 1.3*  PHOS 2.8 2.8  3.0   CBC  Recent Labs Lab 06/06/17 0436 06/07/17 0418 06/08/17 0502  WBC 6.4 6.5 7.7  HGB 12.8* 13.2 12.7*  HCT 37.1* 38.7* 37.3*  PLT 158 194 211   Coag's  Recent Labs Lab 06/07/17 0418  INR 1.20   Sepsis Markers No results for input(s): LATICACIDVEN, PROCALCITON, O2SATVEN in the last 168 hours.  ABG  Recent Labs Lab 06/01/17 1003 06/04/17 0357  PHART 7.582* 7.439  PCO2ART 24.7* 28.2*  PO2ART 171.0* 57.6*   Liver Enzymes  Recent Labs Lab 06/02/17 0500 06/05/17 0420  AST 23 23  ALT 8* 8*  ALKPHOS 49 49  BILITOT 1.1 1.0  ALBUMIN 2.2* 2.0*   Cardiac Enzymes No results for input(s): TROPONINI, PROBNP in the last 168 hours.  Glucose  Recent Labs Lab 06/07/17 1317 06/07/17 1549 06/07/17 1931 06/07/17 2303 06/08/17 0308 06/08/17 0803  GLUCAP 99 109* 113* 110* 104* 112*   Imaging No results found.  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  Hx of CVA, seizures, cirrhosis, non ischemic CM.  ASSESSMENT / PLAN:  PULMONARY A: Compromised airway from altered mental status. P:   Begin PS trials but no extubation given mental status. Attempting to get consent from family for a trach tomorrow, see discussion below.  CARDIOVASCULAR A:  Hypotension. Non ischemic CM. VT this AM P:  Telemetry monitoring Lopressor prn Gentle diureses today Replace electrolytes for target K of 4 and Mg of 2  RENAL A:   Oliguria. Hypokalemia. Hypomagnesemia  P:   Replace  electrolytes as indicated KVO IVF BMET in AM Mg and Phos in AM  GASTROINTESTINAL A:   Hx of Hep C/ETOH with cirrhosis. Nutrition. Ileus > improving P:   Increase feeds to goal Follow LFTs Thiamine, folic acid, MVI  HEMATOLOGIC A:   Thrombocytopenia 2nd to cirrhosis. improving   P:  Follow CBC Transfuse per ICU protocol  INFECTIOUS A:   No evidence for infection.  P:   Observe off antibiotics  ENDOCRINE A:   Hx of hypothyroidism. P:   IV synthyroid Recheck TSH next week  NEUROLOGIC A:   Status epilepticus. Hepatic encephalopathy. Recent hx of CVA. P:   AEDS per neuro Off sedation  Discussed with neurology, feeling is that they may be able to control the partial seizure and that trach/peg until able to get seizure under control is recommended.  Attempted to get a hold of family, no response this AM.  They usually call daily, will await them to call for consent prior to placing patient on the trach schedule.  The patient is critically ill with multiple organ systems failure and requires high complexity decision making for assessment and support, frequent evaluation and titration of therapies, application of advanced monitoring technologies and extensive interpretation of multiple databases.   Critical Care Time devoted to patient care services described in this note is  35  Minutes. This time reflects time of care of this signee Dr Jennet Maduro. This critical care time does not reflect procedure time, or teaching time or supervisory time of PA/NP/Med student/Med Resident etc but could involve care discussion time.  Rush Farmer, M.D. [facility] Pulmonary/Critical Care Medicine. Pager: [phone]. After hours pager: [phone].  06/08/2017, 8:54 AM

## 2017-06-09 ENCOUNTER — Inpatient Hospital Stay (HOSPITAL_COMMUNITY): Payer: Medicare Other

## 2017-06-09 LAB — PROTIME-INR
INR: 1.21
Prothrombin Time: 15.2 seconds (ref 11.4–15.2)

## 2017-06-09 LAB — POCT I-STAT 3, ART BLOOD GAS (G3+)
ACID-BASE DEFICIT: 7 mmol/L — AB (ref 0.0–2.0)
BICARBONATE: 16.9 mmol/L — AB (ref 20.0–28.0)
O2 SAT: 95 %
PCO2 ART: 27.2 mmHg — AB (ref 32.0–48.0)
PO2 ART: 71 mmHg — AB (ref 83.0–108.0)
Patient temperature: 97.1
TCO2: 18 mmol/L — ABNORMAL LOW (ref 22–32)
pH, Arterial: 7.397 (ref 7.350–7.450)

## 2017-06-09 LAB — GLUCOSE, CAPILLARY
GLUCOSE-CAPILLARY: 77 mg/dL (ref 65–99)
GLUCOSE-CAPILLARY: 65 mg/dL (ref 65–99)
Glucose-Capillary: 96 mg/dL (ref 65–99)
Glucose-Capillary: 122 mg/dL — ABNORMAL HIGH (ref 65–99)
Glucose-Capillary: 91 mg/dL (ref 65–99)
Glucose-Capillary: 92 mg/dL (ref 65–99)
Glucose-Capillary: 95 mg/dL (ref 65–99)

## 2017-06-09 LAB — BASIC METABOLIC PANEL
Anion gap: 6 (ref 5–15)
BUN: 5 mg/dL — AB (ref 6–20)
CO2: 19 mmol/L — ABNORMAL LOW (ref 22–32)
CREATININE: 0.72 mg/dL (ref 0.61–1.24)
Calcium: 8.3 mg/dL — ABNORMAL LOW (ref 8.9–10.3)
Chloride: 112 mmol/L — ABNORMAL HIGH (ref 101–111)
GFR calc Af Amer: >60 mL/min (ref >60)
GLUCOSE: 110 mg/dL — AB (ref 65–99)
Potassium: 4 mmol/L (ref 3.5–5.1)
SODIUM: 137 mmol/L (ref 135–145)

## 2017-06-09 LAB — CBC
HCT: 35.9 % — ABNORMAL LOW (ref 39.0–52.0)
Hemoglobin: 12.2 g/dL — ABNORMAL LOW (ref 13.0–17.0)
MCH: 30.4 pg (ref 26.0–34.0)
MCHC: 34 g/dL (ref 30.0–36.0)
MCV: 89.5 fL (ref 78.0–100.0)
PLATELETS: 213 10*3/uL (ref 150–400)
RBC: 4.01 MIL/uL — ABNORMAL LOW (ref 4.22–5.81)
RDW: 17.3 % — AB (ref 11.5–15.5)
WBC: 10.6 10*3/uL — ABNORMAL HIGH (ref 4.0–10.5)

## 2017-06-09 LAB — PHENYTOIN LEVEL, TOTAL: PHENYTOIN LVL: 13.2 ug/mL (ref 10.0–20.0)

## 2017-06-09 LAB — MAGNESIUM: MAGNESIUM: 1.8 mg/dL (ref 1.7–2.4)

## 2017-06-09 LAB — PHOSPHORUS: Phosphorus: 3.1 mg/dL (ref 2.5–4.6)

## 2017-06-09 MED ORDER — ENOXAPARIN SODIUM 100 MG/ML ~~LOC~~ SOLN
90.0000 mg | Freq: Two times a day (BID) | SUBCUTANEOUS | Status: DC
  Administered 2017-06-09 – 2017-06-12 (×6): 90 mg via SUBCUTANEOUS
  Filled 2017-06-09 (×4): qty 0.9
  Filled 2017-06-09: qty 1
  Filled 2017-06-09: qty 0.9
  Filled 2017-06-09 (×2): qty 1

## 2017-06-09 MED ORDER — PREGABALIN 25 MG PO CAPS
75.0000 mg | ORAL_CAPSULE | Freq: Two times a day (BID) | ORAL | Status: DC
  Administered 2017-06-09 – 2017-06-24 (×30): 75 mg
  Filled 2017-06-09 (×5): qty 3
  Filled 2017-06-09: qty 1
  Filled 2017-06-09 (×3): qty 3
  Filled 2017-06-09: qty 1
  Filled 2017-06-09 (×9): qty 3
  Filled 2017-06-09: qty 1
  Filled 2017-06-09 (×10): qty 3

## 2017-06-09 MED ORDER — ARTIFICIAL TEARS OPHTHALMIC OINT
TOPICAL_OINTMENT | OPHTHALMIC | Status: DC | PRN
  Filled 2017-06-09: qty 3.5

## 2017-06-09 MED ORDER — DEXTROSE 50 % IV SOLN
INTRAVENOUS | Status: AC
  Administered 2017-06-09: 25 mL
  Filled 2017-06-09: qty 50

## 2017-06-09 MED ORDER — SODIUM CHLORIDE 0.9 % IV SOLN
100.0000 mg | Freq: Two times a day (BID) | INTRAVENOUS | Status: DC
  Administered 2017-06-09 – 2017-06-10 (×3): 100 mg via INTRAVENOUS
  Filled 2017-06-09 (×4): qty 10

## 2017-06-09 NOTE — Progress Notes (Signed)
S: Seen and examined. No changes overnight.  O: Vitals:   06/09/17 0900 06/09/17 1000  BP: 98/66 109/81  Pulse: 93 (!) 58  Resp: 12 12  Temp:    SpO2: 100% 100%  Neuro: On no sedation Intubated Does not open eyes to loud voice or noxious stimulus. Pupils are round reactive. Ocular cephalic present Corneals present Minimal withdrawal in all 4 extremity DTRs 75+  A: 76 year old man with a past medical history of prior stroke, seizures, presented with partial status epilepticus, has now been status post burst suppression 3 days, off of sedation for the past 2-3 days and continues to be unresponsive with any meaningful response on examination. It is possible that his deranged hepatic function might be contributing to metabolizing sedating medications such as propofol and limiting the exam. Also might not be metabolizing Vimpat. Will recommend decreasing Vimpat as well as Lyrica and then assess response.  Recs: -Decrease Lyrica dose to 75 BID -Decrease Vimpat to 100 BID -Pharmacy to manage Dilantin - Check Dilantin level to maintain 10-20 goal. We will follow.  Amie Portland, MD [facility] [phone]  If 7pm to 7am, please call on call as listed on AMION.

## 2017-06-09 NOTE — Procedures (Signed)
Bedside Tracheostomy Insertion Procedure Note   Patient Details:   Name: DUWAYNE MATTERS DOB: 12-Nov-1940 MRN: [id number]  Procedure: Tracheostomy  Pre Procedure Assessment: ET Tube Size: 7.5 ET Tube secured at lip (cm): 24 Bite block in place: No Breath Sounds: Clear  Post Procedure Assessment: BP 102/65   Pulse (!) 130   Temp 98.7 F (37.1 C) (Axillary)   Resp 12   Ht 5\' 11"  (1.803 m)   Wt 194 lb 14.2 oz (88.4 kg)   SpO2 98%   BMI 27.18 kg/m  O2 sats: stable throughout Complications: No apparent complications Patient did tolerate procedure well Tracheostomy Brand:Shiley Tracheostomy Style:Cuffed Tracheostomy Size: 6 Tracheostomy Secured ETK:[id number], velcro Tracheostomy Placement Confirmation:Trach cuff visualized and in place and Chest X ray ordered for placement    Kathie Dike 06/09/2017, 9:54 AM

## 2017-06-09 NOTE — Procedures (Signed)
Bronchoscopy Procedure Note Nathan Frank [id number] 1941-04-12  Procedure: Bronchoscopy Indications: percutaneous tracheostomy   Procedure Details Consent: Risks of procedure as well as the alternatives and risks of each were explained to the (patient/caregiver).  Consent for procedure obtained. Time Out: Verified patient identification, verified procedure, site/side was marked, verified correct patient position, special equipment/implants available, medications/allergies/relevent history reviewed, required imaging and test results available.  Performed  In preparation for procedure, patient was given 100% FiO2 and bronchoscope lubricated. Sedation: Benzodiazepines, Muscle relaxants and Etomidate  Airway entered and the following bronchi were examined: Bronchi.   Bronchoscope removed.  , Patient placed back on 100% FiO2 at conclusion of procedure.    Evaluation Hemodynamic Status: BP stable throughout; O2 sats: stable throughout Patient's Current Condition: stable Specimens:  None Complications: No apparent complications Patient did tolerate procedure well.   CXR pending    [facility] 06/09/2017  I was present and supervised the entire procedure.  Rush Farmer, M.D. [facility] Pulmonary/Critical Care Medicine. Pager: [phone]. After hours pager: [phone].

## 2017-06-09 NOTE — Procedures (Signed)
Percutaneous Tracheostomy Placement  Consent from family.  Patient sedated, paralyzed and position.  Placed on 100% FiO2 and RR matched.  Area cleaned and draped.  Lidocaine/epi injected.  Skin incision done followed by blunt dissection.  Trachea palpated then punctured, catheter passed and visualized bronchoscopically.  Wire placed and visualized.  Catheter removed.  Airway then entered and dilated.  Size 6 cuffed shiley trach placed and visualized bronchoscopically well above carina.  Good volume returns.  Patient tolerated the procedure well without complications.  Minimal blood loss.  CXR ordered and pending.  Wesam G. Yacoub, M.D. [place] [facility]/Critical Care Medicine. Pager: [phone]. After hours pager: [phone].  

## 2017-06-09 NOTE — Progress Notes (Signed)
PULMONARY / CRITICAL CARE MEDICINE   Name: Nathan Frank MRN: [id number] DOB: April 05, 1941    ADMISSION DATE:  05/28/2017 CONSULTATION DATE:  05/31/17  REFERRING MD:  Dr. Erlinda Hong   CHIEF COMPLAINT:  Seizures, AMS  PATIENT DESCRIPTION: 76 yo male admitted from SNF with weakness and Lt arm tremor.  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  He developed status epilepticus and altered mental status.  He was found to have elevate ammonia level.  He required intubation for airway protection.  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  SUBJECTIVE:  No acute change overnight.  No seizure activity on EEG, now discontinued.   VITAL SIGNS: BP 102/65   Pulse (!) 130   Temp 98.7 F (37.1 C) (Axillary)   Resp 12   Ht 5\' 11"  (1.803 m)   Wt 88.4 kg (194 lb 14.2 oz)   SpO2 98%   BMI 27.18 kg/m   VENTILATOR SETTINGS: Vent Mode: PRVC FiO2 (%):  [30 %] 30 % Set Rate:  [12 bmp] 12 bmp Vt Set:  [600 mL] 600 mL PEEP:  [5 cmH20] 5 cmH20 Plateau Pressure:  [20 cmH20-22 cmH20] 22 cmH20  INTAKE / OUTPUT: I/O last 3 completed shifts: In: 2339.7 [I.V.:1570; NG/GT:369.7; IV Piggyback:400] Out: 2200 [Urine:2200]  PHYSICAL EXAMINATION: Gen:      Cachectic male, NAD  HEENT:  [place]/AT, PERRL, EOM-I and MMM Neck:      Mm moist, ETT  Lungs:    resps even non labored on vent, few scattered rhonchi  CV:         RRR, Nl S1/S2, -M/R/G. Abd:      Soft, NT, ND and +BS Ext:   Scant BLE edema  Skin:      Warm and dry; no rash Neuro:   Unresponsive, pupils pinpoint   LABS:  BMET  Recent Labs Lab 06/07/17 0418 06/08/17 0502 06/09/17 0418  NA 140 138 137  K 3.3* 3.5 4.0  CL 116* 115* 112*  CO2 18* 18* 19*  BUN 10 9 5*  CREATININE 0.66 0.64 0.72  GLUCOSE 77 132* 110*   Electrolytes  Recent Labs Lab 06/07/17 0418 06/08/17 0502 06/09/17 0418  CALCIUM 8.2*  8.4* 8.3*  MG 1.5* 1.3* 1.8  PHOS 2.8 3.0 3.1   CBC  Recent Labs Lab 06/07/17 0418 06/08/17 0502 06/09/17 0418  WBC 6.5 7.7 10.6*  HGB 13.2 12.7* 12.2*  HCT 38.7* 37.3* 35.9*  PLT 194 211 213   Coag's  Recent Labs Lab 06/07/17 0418 06/09/17 0418  INR 1.20 1.21   Sepsis Markers No results for input(s): LATICACIDVEN, PROCALCITON, O2SATVEN in the last 168 hours.  ABG  Recent Labs Lab 06/04/17 0357 06/09/17 0342  PHART 7.439 7.397  PCO2ART 28.2* 27.2*  PO2ART 57.6* 71.0*   Liver Enzymes  Recent Labs Lab 06/05/17 0420  AST 23  ALT 8*  ALKPHOS 49  BILITOT 1.0  ALBUMIN 2.0*   Cardiac Enzymes No results for input(s): TROPONINI, PROBNP in the last 168 hours.  Glucose  Recent Labs Lab 06/08/17 1118 06/08/17 1533 06/08/17 1925 06/08/17 2315 06/09/17 0328 06/09/17 0754  GLUCAP 123* 142* 121* 116* 96 122*   Imaging Dg Chest Port 1 View  Result Date: 06/09/2017 CLINICAL DATA:  Respiratory failure.  Intubated. EXAM: PORTABLE CHEST 1 VIEW COMPARISON:  06/07/2017. FINDINGS: Endotracheal tube in satisfactory position. Right jugular catheter  tip in the inferior aspect of the superior vena cava. Nasogastric tube extending into the stomach. Stable borderline enlarged cardiac silhouette. Increased ill-defined density at both lung bases, including bilateral pleural effusions, larger on the right. Diffuse osteopenia. IMPRESSION: Increased bilateral pleural fluid and adjacent bibasilar atelectasis or pneumonia. Electronically Signed   By: Claudie Revering M.D.   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  Hx of CVA, seizures, cirrhosis, non ischemic CM.  ASSESSMENT / PLAN:  PULMONARY A: Compromised airway from altered mental status. P:   Daily SBT  No extubation given mental status  Plan trach today  Can attempt ATC trials post trach    CARDIOVASCULAR A:  Hypotension. Non ischemic CM. VT this AM P:  Telemetry monitoring Lopressor prn Hold further diuresis with hypotension/pressor needs  Replace electrolytes for target K of 4 and Mg of 2  RENAL A:   Oliguria. Hypokalemia. Hypomagnesemia  P:   Replace electrolytes as indicated KVO IVF BMET in AM Mg and Phos in AM  GASTROINTESTINAL A:   Hx of Hep C/ETOH with cirrhosis. Nutrition. 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 improving   P:  Follow CBC Transfuse per ICU protocol  INFECTIOUS A:   No evidence for infection.  P:   Observe off antibiotics  ENDOCRINE A:   Hx of hypothyroidism. P:   IV synthyroid Recheck TSH week of 9/10  NEUROLOGIC A:   Status epilepticus. Hepatic encephalopathy. Recent hx of CVA. P:   AEDS per neuro Off sedation Trach/PEG as above  F/u ammonia    Nickolas Madrid, NP 06/09/2017  8:53 AM Pager: [phone] or [phone]  Attending Note:  76 year old male with epilepsy who presents to the hospital in partial status.  Was intubated for airway protection.  Remains unresponsive on multiple medications.  On exam, remains completely unresponsive with coarse BS bilaterally.  I reviewed CXR myself, ETT in good position.  Will proceed with tracheostomy today.  Will likely need PEG but will wait for input from neurology.   Hold further sedation for now.  The patient is critically ill with multiple organ systems  failure and requires high complexity decision making for assessment and support, frequent evaluation and titration of therapies, application of advanced monitoring technologies and extensive interpretation of multiple databases.   Critical Care Time devoted to patient care services described in this note is  35  Minutes. This time reflects time of care of this signee Dr Jennet Maduro. This critical care time does not reflect procedure time, or teaching time or supervisory time of PA/NP/Med student/Med Resident etc but could involve care discussion time.  Rush Farmer, M.D. [facility] Pulmonary/Critical Care Medicine. Pager: [phone]. After hours pager: [phone].

## 2017-06-10 LAB — GLUCOSE, CAPILLARY
GLUCOSE-CAPILLARY: 126 mg/dL — AB (ref 65–99)
GLUCOSE-CAPILLARY: 127 mg/dL — AB (ref 65–99)
GLUCOSE-CAPILLARY: 119 mg/dL — AB (ref 65–99)
Glucose-Capillary: 108 mg/dL — ABNORMAL HIGH (ref 65–99)
Glucose-Capillary: 123 mg/dL — ABNORMAL HIGH (ref 65–99)
Glucose-Capillary: 144 mg/dL — ABNORMAL HIGH (ref 65–99)

## 2017-06-10 MED ORDER — AMIODARONE HCL IN DEXTROSE 360-4.14 MG/200ML-% IV SOLN
60.0000 mg/h | INTRAVENOUS | Status: AC
  Administered 2017-06-10 (×2): 60 mg/h via INTRAVENOUS
  Filled 2017-06-10 (×2): qty 200

## 2017-06-10 MED ORDER — AMIODARONE HCL IN DEXTROSE 360-4.14 MG/200ML-% IV SOLN
30.0000 mg/h | INTRAVENOUS | Status: DC
  Administered 2017-06-11 – 2017-06-17 (×13): 30 mg/h via INTRAVENOUS
  Filled 2017-06-10 (×14): qty 200

## 2017-06-10 MED ORDER — METOPROLOL TARTRATE 5 MG/5ML IV SOLN
2.5000 mg | INTRAVENOUS | Status: DC | PRN
  Administered 2017-06-10 – 2017-06-11 (×3): 5 mg via INTRAVENOUS
  Filled 2017-06-10 (×3): qty 5

## 2017-06-10 MED ORDER — AMIODARONE LOAD VIA INFUSION
150.0000 mg | Freq: Once | INTRAVENOUS | Status: AC
  Administered 2017-06-10: 150 mg via INTRAVENOUS
  Filled 2017-06-10: qty 83.34

## 2017-06-10 NOTE — Progress Notes (Addendum)
PULMONARY / CRITICAL CARE MEDICINE   Name: Nathan Frank MRN: [id number] DOB: 1941/05/09    ADMISSION DATE:  05/28/2017 CONSULTATION DATE:  05/31/17  REFERRING MD:  Dr. Erlinda Hong   CHIEF COMPLAINT:  Seizures, AMS  PATIENT DESCRIPTION: 76 yo male admitted from SNF with weakness and Lt arm tremor.  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  He developed status epilepticus and altered mental status.  He was found to have elevate ammonia level.  He required intubation for airway protection.  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  SUBJECTIVE:  Trach yesterday.  Tol some PS wean this am but requiring PS 12/5.  Weaning pressors.   VITAL SIGNS: BP 99/60 (BP Location: Left Arm)   Pulse (!) 121   Temp 98.8 F (37.1 C) (Axillary)   Resp (!) 26   Ht 5\' 11"  (1.803 m)   Wt 86.5 kg (190 lb 11.2 oz)   SpO2 90%   BMI 26.60 kg/m   VENTILATOR SETTINGS: Vent Mode: PSV;CPAP FiO2 (%):  [30 %] 30 % Set Rate:  [12 bmp] 12 bmp Vt Set:  [600 mL] 600 mL PEEP:  [5 cmH20] 5 cmH20 Pressure Support:  [12 cmH20] 12 cmH20 Plateau Pressure:  [18 cmH20-22 cmH20] 22 cmH20  INTAKE / OUTPUT: I/O last 3 completed shifts: In: 1125.9 [I.V.:400.9; NG/GT:725] Out: 3025 [Urine:2975; Stool:50]  PHYSICAL EXAMINATION: Gen:      Cachectic male, NAD on PS wean  HEENT:  [place]/AT, PERRL, EOM-I and MMM Neck:      Mm moist, trach site c/d  Lungs:    resps even non labored on PS 12/5, RR 18, few scattered rhonchi  CV:         RRR, Nl S1/S2, -M/R/G. Abd:      Soft, NT, ND and +BS Ext:   Scant BLE edema  Skin:      Warm and dry; no rash Neuro:   No response, pupils pinpoint   LABS:  BMET  Recent Labs Lab 06/07/17 0418 06/08/17 0502 06/09/17 0418  NA 140 138 137  K 3.3* 3.5 4.0  CL 116* 115* 112*  CO2 18* 18* 19*  BUN 10 9 5*  CREATININE 0.66 0.64 0.72  GLUCOSE 77 132*  110*   Electrolytes  Recent Labs Lab 06/07/17 0418 06/08/17 0502 06/09/17 0418  CALCIUM 8.2* 8.4* 8.3*  MG 1.5* 1.3* 1.8  PHOS 2.8 3.0 3.1   CBC  Recent Labs Lab 06/07/17 0418 06/08/17 0502 06/09/17 0418  WBC 6.5 7.7 10.6*  HGB 13.2 12.7* 12.2*  HCT 38.7* 37.3* 35.9*  PLT 194 211 213   Coag's  Recent Labs Lab 06/07/17 0418 06/09/17 0418  INR 1.20 1.21   Sepsis Markers No results for input(s): LATICACIDVEN, PROCALCITON, O2SATVEN in the last 168 hours.  ABG  Recent Labs Lab 06/04/17 0357 06/09/17 0342  PHART 7.439 7.397  PCO2ART 28.2* 27.2*  PO2ART 57.6* 71.0*   Liver Enzymes  Recent Labs Lab 06/05/17 0420  AST 23  ALT 8*  ALKPHOS 49  BILITOT 1.0  ALBUMIN 2.0*   Cardiac Enzymes No results for input(s): TROPONINI, PROBNP in the last 168 hours.  Glucose  Recent Labs Lab 06/09/17 1630 06/09/17 1938 06/09/17 2039 06/09/17 2320 06/10/17 0342 06/10/17 0741  GLUCAP 77 65 92 95 108* 126*   Imaging Dg Chest Port 1 View  Result Date: 06/09/2017  CLINICAL DATA:  Tracheostomy tube placed. EXAM: PORTABLE CHEST 1 VIEW COMPARISON:  Earlier today. FINDINGS: The endotracheal tube has been replaced with a tracheostomy tube in satisfactory position. Nasogastric tube side hole in the mid stomach and tip not included. Right jugular catheter tip in the upper right atrium, lower in position with a decreased inspiration. No significant change in bilateral pleural fluid and bibasilar airspace opacity. Stable borderline enlarged cardiac silhouette. Left mid thoracic spine costovertebral degenerative changes. IMPRESSION: 1. Tracheostomy tube in satisfactory position. 2. No significant change in bilateral pleural effusions and bibasilar atelectasis or pneumonia. Electronically Signed   By: Claudie Revering M.D.   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  Hx of CVA, seizures, cirrhosis, non ischemic CM.  ASSESSMENT / PLAN:  PULMONARY A: Compromised airway from altered mental status - s/p trach 9/5 P:   Trach care  PS wean as tol  No ATC for now as still requiring high levels PS F/u CXR  Mobilize as able   CARDIOVASCULAR A:  Hypotension - improving. Non ischemic CM - EF 45%, severe TR Intermittent VT - resolved.  P:  Telemetry monitoring Lopressor prn Hold further diuresis with hypotension/pressor needs  Replace electrolytes for target K of 4 and Mg of 2  RENAL A:   Oliguria. Hypokalemia. Hypomagnesemia  P:   Replace electrolytes as indicated KVO IVF BMET in AM Mg and Phos in AM  GASTROINTESTINAL A:   Hx of Hep C/ETOH with cirrhosis. Nutrition. 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 improving   P:  Follow CBC Transfuse per ICU protocol  INFECTIOUS A:   No evidence for infection.  P:   Observe off antibiotics  ENDOCRINE A:   Hx of hypothyroidism. P:   IV synthyroid Recheck TSH week of 9/10  NEUROLOGIC A:   Status epilepticus. Hepatic encephalopathy. Recent hx of CVA. P:   AEDS per neuro Off sedation Trach/PEG as above  F/u ammonia    Nickolas Madrid, NP 06/10/2017  8:56 AM Pager: [phone] or [phone]  STAFF NOTE: I, Merrie Roof, MD FACP have personally reviewed patient's available data, including medical history,  events of note, physical examination and test results as part of my evaluation. I have discussed with resident/NP and other care providers such as pharmacist, RN and RRT. In addition, I personally evaluated patient and elicited key findings of: not awaken, NOT FC, botes swab and yawns, GCS 3, trach clean, crackles bases, some edema, was neg 1.1 liters, would prefer this to remain, pcxr which I reviewed shows basilar atx/ effusions, would prefer neg balance to improve chances weaning, PS 12 required, PS to 10 throughout day if able, no role thoracentesis, follow pcxr, feeding, prognosis appears poor to me, consider pall care assessment, will require peg if we  treat aggressive, follow renal fxn in am, tachy fib rvr, borderline bP, amio, dc neo not needed, goal MAP 60, fall risk of concern and liver dz, as far as use Anticoagulation The patient is critically ill with multiple organ systems failure and requires high complexity decision making for assessment and support, frequent evaluation and titration of therapies, application of advanced monitoring technologies and extensive interpretation of multiple databases.   Critical Care Time devoted to patient care services described in this note is 30 Minutes. This time reflects time of care of this signee: Merrie Roof, MD FACP. This critical care time does not reflect procedure time, or teaching time or supervisory time of PA/NP/Med student/Med Resident etc but could involve care discussion time. Rest per NP/medical resident whose note is outlined above and that I agree with   Lavon Paganini. Titus Mould, MD, [place] Pgr: [facility] 06/10/2017 9:57 AM

## 2017-06-10 NOTE — Progress Notes (Signed)
Neurology progress note  Subjective Patient seen and examined this morning. Overnight no acute events.  Objective Vitals:   06/10/17 0740 06/10/17 0800  BP: 96/67 99/60  Pulse: (!) 123 (!) 121  Resp: (!) 27 (!) 26  Temp:  99.9 F (37.7 C)  SpO2: 96% 90%  Neuro: On no sedation Trached Does not open eyes to loud voice or noxious stimulus. Pupils are round reactive. Ocular cephalic present Corneals present Minimal withdrawal in all 4 extremity DTRs 1+  Assessment 76 year old man with a past medical history of a prior stroke, seizures who presented with partial status epilepticus, now has been post burst suppression for over 3-4 days and still continues to be completely unresponsive. Given his deranged renal function, he might still be metabolizing the propofol and the Vimpat but this has been now a long time for him to be so severely encephalopathic. Continuous EEG of about 7 days did not reveal any seizure activity and was discontinued recently. Vimpat and Lyrica doses were decreased yesterday as well. At this time, I think it would be prudent to initiate goals of care conversation with the family as his mentation is not improving and his neurological exam has not improved.  Impression Partial status epilepticus-resolved History of prior stroke Acute encephalopathy  Recommendations Vimpat levels were ordered overnight. We will wait for those levels to come back. Continue neuro checks. We will follow with you   Amie Portland, MD [facility] [phone]  If 7pm to 7am, please call on call as listed on AMION.

## 2017-06-11 ENCOUNTER — Inpatient Hospital Stay (HOSPITAL_COMMUNITY): Payer: Medicare Other

## 2017-06-11 DIAGNOSIS — Z9911 Dependence on respirator [ventilator] status: Secondary | ICD-10-CM

## 2017-06-11 DIAGNOSIS — Z430 Encounter for attention to tracheostomy: Secondary | ICD-10-CM

## 2017-06-11 LAB — CBC
HCT: 27.5 % — ABNORMAL LOW (ref 39.0–52.0)
HEMATOCRIT: 29 % — AB (ref 39.0–52.0)
HEMOGLOBIN: 9.8 g/dL — AB (ref 13.0–17.0)
Hemoglobin: 9.4 g/dL — ABNORMAL LOW (ref 13.0–17.0)
MCH: 30.2 pg (ref 26.0–34.0)
MCH: 30.3 pg (ref 26.0–34.0)
MCHC: 33.8 g/dL (ref 30.0–36.0)
MCHC: 34.2 g/dL (ref 30.0–36.0)
MCV: 89.2 fL (ref 78.0–100.0)
MCV: 88.7 fL (ref 78.0–100.0)
PLATELETS: 135 10*3/uL — AB (ref 150–400)
Platelets: 136 10*3/uL — ABNORMAL LOW (ref 150–400)
RBC: 3.25 MIL/uL — AB (ref 4.22–5.81)
RBC: 3.1 MIL/uL — ABNORMAL LOW (ref 4.22–5.81)
RDW: 17 % — ABNORMAL HIGH (ref 11.5–15.5)
RDW: 17.1 % — ABNORMAL HIGH (ref 11.5–15.5)
WBC: 10.8 10*3/uL — ABNORMAL HIGH (ref 4.0–10.5)
WBC: 12.1 10*3/uL — ABNORMAL HIGH (ref 4.0–10.5)

## 2017-06-11 LAB — BASIC METABOLIC PANEL
ANION GAP: 7 (ref 5–15)
BUN: 13 mg/dL (ref 6–20)
CALCIUM: 8.1 mg/dL — AB (ref 8.9–10.3)
CO2: 22 mmol/L (ref 22–32)
CREATININE: 0.61 mg/dL (ref 0.61–1.24)
Chloride: 109 mmol/L (ref 101–111)
Glucose, Bld: 138 mg/dL — ABNORMAL HIGH (ref 65–99)
Potassium: 2.6 mmol/L — CL (ref 3.5–5.1)
SODIUM: 138 mmol/L (ref 135–145)

## 2017-06-11 LAB — GLUCOSE, CAPILLARY
GLUCOSE-CAPILLARY: 153 mg/dL — AB (ref 65–99)
GLUCOSE-CAPILLARY: 152 mg/dL — AB (ref 65–99)
GLUCOSE-CAPILLARY: 137 mg/dL — AB (ref 65–99)
Glucose-Capillary: 131 mg/dL — ABNORMAL HIGH (ref 65–99)
Glucose-Capillary: 145 mg/dL — ABNORMAL HIGH (ref 65–99)

## 2017-06-11 LAB — PHENYTOIN LEVEL, TOTAL: PHENYTOIN LVL: 13.5 ug/mL (ref 10.0–20.0)

## 2017-06-11 LAB — AMMONIA: Ammonia: 70 umol/L — ABNORMAL HIGH (ref 9–35)

## 2017-06-11 LAB — MAGNESIUM: MAGNESIUM: 1.4 mg/dL — AB (ref 1.7–2.4)

## 2017-06-11 LAB — PHOSPHORUS: PHOSPHORUS: 2.5 mg/dL (ref 2.5–4.6)

## 2017-06-11 MED ORDER — PHENYTOIN SODIUM 50 MG/ML IJ SOLN
100.0000 mg | Freq: Two times a day (BID) | INTRAMUSCULAR | Status: DC
  Administered 2017-06-12 – 2017-06-13 (×3): 100 mg via INTRAVENOUS
  Filled 2017-06-11 (×3): qty 2

## 2017-06-11 MED ORDER — SODIUM CHLORIDE 0.9 % IV SOLN
50.0000 mg | Freq: Two times a day (BID) | INTRAVENOUS | Status: DC
  Administered 2017-06-11 – 2017-06-21 (×21): 50 mg via INTRAVENOUS
  Filled 2017-06-11 (×31): qty 5

## 2017-06-11 MED ORDER — FUROSEMIDE 10 MG/ML IJ SOLN
40.0000 mg | Freq: Once | INTRAMUSCULAR | Status: AC
  Administered 2017-06-11: 40 mg via INTRAVENOUS
  Filled 2017-06-11: qty 4

## 2017-06-11 MED ORDER — POTASSIUM CHLORIDE 10 MEQ/100ML IV SOLN
10.0000 meq | INTRAVENOUS | Status: AC
  Administered 2017-06-11 (×3): 10 meq via INTRAVENOUS
  Filled 2017-06-11 (×3): qty 100

## 2017-06-11 MED ORDER — SODIUM CHLORIDE 0.9 % IV SOLN
1250.0000 mg | Freq: Two times a day (BID) | INTRAVENOUS | Status: DC
  Administered 2017-06-11 – 2017-06-13 (×5): 1250 mg via INTRAVENOUS
  Filled 2017-06-11 (×5): qty 12.5

## 2017-06-11 MED ORDER — MAGNESIUM SULFATE 2 GM/50ML IV SOLN
2.0000 g | Freq: Once | INTRAVENOUS | Status: AC
  Administered 2017-06-11: 2 g via INTRAVENOUS
  Filled 2017-06-11: qty 50

## 2017-06-11 NOTE — Plan of Care (Addendum)
Called patient's son Canuto Kingston, listed as emergency contact to set up a meeting and reached voicemail. Voicemail left for Mr. Aida Puffer with team phone number. Will await return phone call and reattempt to contact him in the morning.

## 2017-06-11 NOTE — Progress Notes (Signed)
NEUROLOGY PROGRESS NOTE  S: No change.   O: Vitals:   06/11/17 0800 06/11/17 0900  BP: 107/61 102/72  Pulse: (!) 123 98  Resp: 13 (!) 31  Temp: 97.6 F (36.4 C)   SpO2: 100% 98%   On no sedation Trached Does not open eyes to loud voice or noxious stimulus. Pupils are round reactive. Ocular cephalic present Corneals present No withdrawal in any ext.  A: Nathan Frank who came in with partial status epileptics, has continued to be unresponseive. No seizure activity noted. He was under burst supperession for over 3 days, and since reducing sedation, has not regained consciousness. LTM EEGs were unremarkable for seizures but did have focal dysfunction. He had deranged liver functions and has had enough time to metabolize a lot of sedating medications including propofol and Vimpat. Vimpat Levels are being ordered and are pending. I would like to reimage his brain to assess for any structural or vascular abnormalities. And also get an EEG. Given that his exam is been poor for some days, I feel that he might not have a very good prognosis going forward. I will reassess him after the testing recommended below becomes available   Recs: MRI brain Vimpat decrease to 50 BID Keppra decrease to 1250 BID Keep holding Dilantin,. Check albumin and dilantin level in the AM. Pharmacy consult for dilantin Routine EEG  -- Amie Portland, MD [facility] [phone]  If 7pm to 7am, please call on call as listed on AMION.

## 2017-06-11 NOTE — Care Management Important Message (Signed)
Important Message  Patient Details  Name: Nathan Frank MRN: [id number] Date of Birth: 1940/12/17   Medicare Important Message Given:  Yes    [name] May 06/11/2017, 9:27 AM

## 2017-06-11 NOTE — Progress Notes (Addendum)
MEDICATION RELATED CONSULT NOTE - INITIAL   Pharmacy Consult for Phenytoin Indication: Seizures  Allergies  Allergen Reactions  . Nsaids Other (See Comments)    Acute gastric ulcers  . Oxycodone Other (See Comments)    Per the risk calculating tool RIOSORD: (Risk Index for Overdose or Serious Opioid -induced Respiratory Depression Risk ) calculated risk in ensuing 6 months  = 83% ( see Problem List for discussion)    Patient Measurements: Height: 5\' 11"  (180.3 cm) Weight: 198 lb 3.1 oz (89.9 kg) IBW/kg (Calculated) : 75.3  Assessment: 76 yo M with heroine/EtOH abuse leading to recent admit for new-onset partial seizure. MRI consistent with seizure. CT negative for acute abnormality (old CVA seen) Phenytoin level on 9/5 was 13.2 which corrects to 26.4 with low albumin. However level was drawn only 4 hrs after previous dose and for monitoring purposes trough levels are important when using immediate release (IV or PO) phenytoin. On Keppra, Vimpat, and phenytoin.  Goal of Therapy:  Phenytoin total level of 10-85mcg/mL  Plan:  Continue phenytoin 100mg  IV Q8h Continue Keppra 1,250mg  IV Q12h per MD Continue lacosamide 50mg  IV Q12h per MD Recheck phenytoin trough level today Will also recheck Cmet tomorrow for updated albumin in case fluid status has changed Monitor for seizure activity, toxicities, CBC, LFTs  ADDENDUM:  Phenytoin trough today is 13.5 which corrects to around 27.  Plan:  Decrease dose of phenytoin to 100mg  IV Q12h starting tomorrow at 1000 Will consider rechecking level in 4-5 days if still inpatient   Elenor Quinones, PharmD, [facility] Clinical Pharmacist Pager [phone] 06/11/2017 10:34 AM

## 2017-06-11 NOTE — Progress Notes (Addendum)
PROGRESS NOTE [facility]   Nathan Frank   YKD:[id number] DOB: 1941-02-13  DOA: 05/28/2017 PCP: Thressa Sheller, MD   Brief Narrative:  Nathan Frank is a 76 year old male with past medical history of hepatitis C, alcohol abuse with cirrhosis, alcoholic cardiomyopathy and recently diagnosed with seizures. Presented to the hospital with weakness and left arm tremor he was admitted for suspected seizures to the medical floor on 8/24. Subsequently patient on 8/27 developed status epilepticus and altered mental status for which he required intubation for airway protection and was transferred to the ICU. He was also found to have elevated ammonia levels. Neurology was consulted, burst suppression was started on 8/28 which lasted for over 3 days. Long-term EEG was unremarkable for seizure from the focal distribution. At one point was hypotensive required, pressor for a short period. Patient was unable to be weaned off ventilator so tracheostomy was placed on 06/09/2017. Despite all interventions patient remains unresponsive. Neurology recommending further brain images and EEG. Given poor prognosis palliative care has been consulted.   Subjective: Patient not responsive. Edematous   Assessment & Plan: Status epilepticus - status post burst suppression Neurology following AEDS per their recommendation Off sedation Follow-up MRI and EEG  Monitor the Vimptat levels  Patient with poor prognosis, palliative care consulted for goals of care   Hepatic encephalopathy  Hx Hep C with ETOH cirrhosis  Ammonia 70, will monitor for now, PT NPO and unresponsive, unable to give lactulose at this point. Recatl tube in place for fecal incontinence. If ammonia continues to increase will give lactulose rectally  Check LFT's   Respiratory compromise due to encephalopathy - s/p trach 9/5 Cont trach care  PS wean at tolerated   Alcoholic cardiomyopathy/?tachy fib intermittently  EF 45% w severe TR    Initially was hypotensive treated with pressor - now weaned off  Now with signs of fluid overload/3rd spacing - will give Lasix 40 mg x 1 and monitor  CXR shows pleural effusions Good urine output  On Amion for intermittent tachy fib with RVR  ? If need anticoagulation, would be concern due to liver dz and thrombocytopenia  Thrombocytopenia - likely for cirrhosis and acute illness  Plt dropped half from yesterday - will repeat CBC if still low d/c Lovenox   Hypokalemia/Hypomagnesemia  Replete  Check level in AM   Goals of care  Palliative care consulted, patient with poor prognosis, will discuss with family as of now patient is Full code. If they want to continue aggressive treatment. Patient will need a PEG tube for nutrition   DVT prophylaxis: Lovenox  Code Status: FULL  Family Communication: None at bedside  Disposition Plan: TBD    Consultants:   PCCM   Neurology   Procedures:   Rt IJ 8/28>>  Trach 9/5   Antimicrobials: Anti-infectives    None       Objective: Vitals:   06/11/17 0600 06/11/17 0700 06/11/17 0800 06/11/17 0900  BP: 111/63 108/60 107/61 102/72  Pulse: (!) 116 (!) 112 (!) 123 98  Resp: _0 (!) 31  Temp:   97.6 F (36.4 C)   TempSrc:   Oral   SpO2: 100% 99% 100% 98%  Weight:      Height:        Intake/Output Summary (Last 24 hours) at 06/11/17 1143 Last data filed at 06/11/17 0600  Gross per 24 hour  Intake          2951.45 ml  Output  630 ml  Net          2321.45 ml   Filed Weights   06/09/17 0500 06/10/17 0412 06/11/17 0209  Weight: 88.4 kg (194 lb 14.2 oz) 86.5 kg (190 lb 11.2 oz) 89.9 kg (198 lb 3.1 oz)    Examination:  General exam: Not responsive  HEENT: Doe not open eyes  Respiratory system: Trach clear, bibasilar crackles, no wheezing  Cardiovascular system: X4H0 RRR diastolic murmur  Gastrointestinal system: Abdomen is edematous, nondistended, soft and nontender.  Central nervous system: GCS  3 Extremities: Edematous in all 4 extremities  Skin: No rashes Psychiatry: Unable to perform  Data Reviewed: I have personally reviewed following labs and imaging studies  CBC:  Recent Labs Lab 06/06/17 0436 06/07/17 0418 06/08/17 0502 06/09/17 0418 06/11/17 0536  WBC 6.4 6.5 7.7 10.6* 10.8*  HGB 12.8* 13.2 12.7* 12.2* 9.8*  HCT 37.1* 38.7* 37.3* 35.9* 29.0*  MCV 88.1 89.8 90.1 89.5 89.2  PLT 158 194 211 213 388*   Basic Metabolic Panel:  Recent Labs Lab 06/06/17 0436 06/07/17 0418 06/08/17 0502 06/09/17 0418 06/11/17 0536  NA 141 140 138 137 138  K 3.3* 3.3* 3.5 4.0 2.6*  CL 116* 116* 115* 112* 109  CO2 18* 18* 18* 19* 22  GLUCOSE 85 77 132* 110* 138*  BUN _0 5* 13  CREATININE 0.81 0.66 0.64 0.72 0.61  CALCIUM 8.3* 8.2* 8.4* 8.3* 8.1*  MG 1.7 1.5* 1.3* 1.8 1.4*  PHOS 2.8 2.8 3.0 3.1 2.5   GFR: Estimated Creatinine Clearance: 83.7 mL/min (by C-G formula based on SCr of 0.61 mg/dL). Liver Function Tests:  Recent Labs Lab 06/05/17 0420  AST 23  ALT 8*  ALKPHOS 49  BILITOT 1.0  PROT 5.7*  ALBUMIN 2.0*   No results for input(s): LIPASE, AMYLASE in the last 168 hours.  Recent Labs Lab 06/11/17 0536  AMMONIA 70*   Coagulation Profile:  Recent Labs Lab 06/07/17 0418 06/09/17 0418  INR 1.20 1.21   Cardiac Enzymes: No results for input(s): CKTOTAL, CKMB, CKMBINDEX, TROPONINI in the last 168 hours. BNP (last 3 results) No results for input(s): PROBNP in the last 8760 hours. HbA1C: No results for input(s): HGBA1C in the last 72 hours. CBG:  Recent Labs Lab 06/10/17 1547 06/10/17 2032 06/10/17 2309 06/11/17 0613 06/11/17 0835  GLUCAP 119* 123* 144* 131* 145*   Lipid Profile: No results for input(s): CHOL, HDL, LDLCALC, TRIG, CHOLHDL, LDLDIRECT in the last 72 hours. Thyroid Function Tests: No results for input(s): TSH, T4TOTAL, FREET4, T3FREE, THYROIDAB in the last 72 hours. Anemia Panel: No results for input(s): VITAMINB12, FOLATE,  FERRITIN, TIBC, IRON, RETICCTPCT in the last 72 hours. Sepsis Labs: No results for input(s): PROCALCITON, LATICACIDVEN in the last 168 hours.  No results found for this or any previous visit (from the past 240 hour(s)).    Radiology Studies: No results found.    Scheduled Meds: . chlorhexidine gluconate (MEDLINE KIT)  15 mL Mouth Rinse BID  . Chlorhexidine Gluconate Cloth  6 each Topical Daily  . enoxaparin (LOVENOX) injection  90 mg Subcutaneous Q12H  . feeding supplement (PRO-STAT SUGAR FREE 64)  30 mL Per Tube BID  . folic acid  1 mg Intravenous Daily  . levothyroxine  25 mcg Intravenous Daily  . mouth rinse  15 mL Mouth Rinse 10 times per day  . pantoprazole (PROTONIX) IV  40 mg Intravenous Q24H  . phenytoin (DILANTIN) IV  100 mg Intravenous Q8H  . pregabalin  75 mg Per Tube BID  . sodium chloride flush  10-40 mL Intracatheter Q12H  . thiamine injection  100 mg Intravenous Daily   Continuous Infusions: . amiodarone 30 mg/hr (06/11/17 0011)  . dextrose 5 % and 0.45% NaCl 75 mL/hr at 06/10/17 2000  . feeding supplement (VITAL AF 1.2 CAL) 1,000 mL (06/10/17 2000)  . lacosamide (VIMPAT) IV    . levETIRAcetam    . potassium chloride 10 mEq (06/11/17 1036)     LOS: 13 days    Time spent: Total of 35 minutes spent with pt, greater than 50% of which was spent in discussion of  treatment, counseling and coordination of care    Chipper Oman, MD Pager: Text Page via [email or web address]   If 7PM-7AM, please contact night-coverage [email or web address] 06/11/2017, 11:43 AM

## 2017-06-11 NOTE — Care Management Note (Addendum)
Case Management Note  Patient Details  Name: Nathan Frank MRN: [id number] Date of Birth: 17-Jan-1941  Subjective/Objective:    From [facility], presents with status epilepticus, hepatic encephalopathy and compromised airway.  Hx of CVA, seizures, cirrhosis, non ischemic CM. Has trach/vent, NG tube feeds.   9/12 [place], BSN- per CSW , MD wanted to look at Ltach, per MD notes patient has no meaningful recovery, not appropriate for ltach.  NCM informed MD . Per PCCM he may not come off vent.  Family conts to want aggressive care. CSW following for SNF.    9/18 [place], BSN - discussed in [place], per medical director appropriate for ltach, NCM spoke with son, Cristo [phone], he chose [facility]. Referral given to [place] with [facility] for ltach.                Action/Plan: NCM will follow along with CSW for dc needs.  Expected Discharge Date:                  Expected Discharge Plan:  Skilled Nursing Facility  In-House Referral:  Clinical Social Work  Discharge planning Services     Post Acute Care Choice:    Choice offered to:     DME Arranged:    DME Agency:     HH Arranged:    [facility] Agency:     Status of Service:  In process, will continue to follow  If discussed at Long Length of Stay Meetings, dates discussed:    Additional Comments:  Nathan Mayo, RN 06/11/2017, 8:47 AM

## 2017-06-11 NOTE — Progress Notes (Signed)
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  Intubated for airway.  Failed weaning and required trach.  Hx of ETOH and Hep C with cirrhosis, heroin abuse, hepatic encephalopathy, GIB, thrombocytopenia, non ischemic CM.  Subjective: Tolerating pressure support  Vital signs: BP 106/71 (BP Location: Left Arm)   Pulse (!) 108   Temp 99.4 F (37.4 C) (Oral)   Resp (!) 33   Ht 5\' 11"  (1.803 m)   Wt 198 lb 3.1 oz (89.9 kg)   SpO2 99%   BMI 27.64 kg/m   Intake/output: I/O last 3 completed shifts: In: 3810.9 [I.V.:1941.9; NG/GT:1870] Out: 1930 [Urine:1880; Stool:50]  General - on vent Eyes - pupils reactive ENT - trach site clean Cardiac - regular, no murmur Chest - no wheeze, rales Abd - soft, non tender Ext - 1+ edema Skin - no rashes Neuro - not following commands   CMP Latest Ref Rng & Units 06/11/2017 06/09/2017 06/08/2017  Glucose 65 - 99 mg/dL 138(H) 110(H) 132(H)  BUN 6 - 20 mg/dL 13 5(L) 9  Creatinine 0.61 - 1.24 mg/dL 0.61 0.72 0.64  Sodium 135 - 145 mmol/L 138 137 138  Potassium 3.5 - 5.1 mmol/L 2.6(LL) 4.0 3.5  Chloride 101 - 111 mmol/L 109 112(H) 115(H)  CO2 22 - 32 mmol/L 22 19(L) 18(L)  Calcium 8.9 - 10.3 mg/dL 8.1(L) 8.3(L) 8.4(L)  Total Protein 6.5 - 8.1 g/dL - - -  Total Bilirubin 0.3 - 1.2 mg/dL - - -  Alkaline Phos 38 - 126 U/L - - -  AST 15 - 41 U/L - - -  ALT 17 - 63 U/L - - -    CBC Latest Ref Rng & Units 06/11/2017 06/09/2017 06/08/2017  WBC 4.0 - 10.5 K/uL 10.8(H) 10.6(H) 7.7  Hemoglobin 13.0 - 17.0 g/dL 9.8(L) 12.2(L) 12.7(L)  Hematocrit 39.0 - 52.0 % 29.0(L) 35.9(L) 37.3(L)  Platelets 150 - 400 K/uL 136(L) 213 211    ABG    Component Value Date/Time   PHART 7.397 06/09/2017 0342   PCO2ART 27.2 (L) 06/09/2017 0342   PO2ART 71.0 (L) 06/09/2017 0342   HCO3 16.9 (L) 06/09/2017 0342   TCO2 18 (L) 06/09/2017 0342   ACIDBASEDEF 7.0 (H)  06/09/2017 0342   O2SAT 95.0 06/09/2017 0342    CBG (last 3)   Recent Labs  06/11/17 0613 06/11/17 0835 06/11/17 1212  GLUCAP 131* 145* 153*     No results found.   Lines/tubes: Lurline Idol 9/05 (JY) >>   Assessment/plan:  Compromised airway. Failure to wean s/p tracheostomy. Pleural effusions. - wean to trach collar as able - f/u CXR intermittently - negative fluid balance as able  PCCM will f/u on Monday 06/14/17.  Call if help needed sooner.  [name], MD [facility] Pulmonary/Critical Care 06/11/2017, 1:00 PM Pager:  [phone] After 3pm call: [phone]

## 2017-06-11 NOTE — Progress Notes (Signed)
RT transported pt to [facility] without event. RT for this unit notified.

## 2017-06-12 ENCOUNTER — Inpatient Hospital Stay (HOSPITAL_COMMUNITY): Payer: Medicare Other

## 2017-06-12 DIAGNOSIS — R601 Generalized edema: Secondary | ICD-10-CM

## 2017-06-12 LAB — AMMONIA: Ammonia: 71 umol/L — ABNORMAL HIGH (ref 9–35)

## 2017-06-12 LAB — COMPREHENSIVE METABOLIC PANEL
ALK PHOS: 61 U/L (ref 38–126)
ALT: 21 U/L (ref 17–63)
AST: 66 U/L — AB (ref 15–41)
Albumin: 1.5 g/dL — ABNORMAL LOW (ref 3.5–5.0)
Anion gap: 7 (ref 5–15)
BUN: 17 mg/dL (ref 6–20)
CALCIUM: 8.1 mg/dL — AB (ref 8.9–10.3)
CHLORIDE: 106 mmol/L (ref 101–111)
CO2: 23 mmol/L (ref 22–32)
CREATININE: 0.77 mg/dL (ref 0.61–1.24)
GFR calc Af Amer: >60 mL/min (ref >60)
Glucose, Bld: 153 mg/dL — ABNORMAL HIGH (ref 65–99)
Potassium: 2.6 mmol/L — CL (ref 3.5–5.1)
SODIUM: 136 mmol/L (ref 135–145)
Total Bilirubin: 0.7 mg/dL (ref 0.3–1.2)
Total Protein: 5 g/dL — ABNORMAL LOW (ref 6.5–8.1)

## 2017-06-12 LAB — CBC WITH DIFFERENTIAL/PLATELET
Basophils Absolute: 0 10*3/uL (ref 0.0–0.1)
Basophils Relative: 0 %
Eosinophils Absolute: 0.1 10*3/uL (ref 0.0–0.7)
Eosinophils Relative: 1 %
HEMATOCRIT: 26.3 % — AB (ref 39.0–52.0)
Hemoglobin: 8.9 g/dL — ABNORMAL LOW (ref 13.0–17.0)
LYMPHS PCT: 8 %
Lymphs Abs: 0.7 10*3/uL (ref 0.7–4.0)
MCH: 29.9 pg (ref 26.0–34.0)
MCHC: 33.8 g/dL (ref 30.0–36.0)
MCV: 88.3 fL (ref 78.0–100.0)
MONO ABS: 1.8 10*3/uL — AB (ref 0.1–1.0)
MONOS PCT: 19 %
NEUTROS ABS: 6.8 10*3/uL (ref 1.7–7.7)
Neutrophils Relative %: 72 %
Platelets: 126 10*3/uL — ABNORMAL LOW (ref 150–400)
RBC: 2.98 MIL/uL — ABNORMAL LOW (ref 4.22–5.81)
RDW: 16.9 % — AB (ref 11.5–15.5)
WBC: 9.4 10*3/uL (ref 4.0–10.5)

## 2017-06-12 LAB — GLUCOSE, CAPILLARY
GLUCOSE-CAPILLARY: 128 mg/dL — AB (ref 65–99)
GLUCOSE-CAPILLARY: 154 mg/dL — AB (ref 65–99)
Glucose-Capillary: 125 mg/dL — ABNORMAL HIGH (ref 65–99)
Glucose-Capillary: 139 mg/dL — ABNORMAL HIGH (ref 65–99)
Glucose-Capillary: 155 mg/dL — ABNORMAL HIGH (ref 65–99)

## 2017-06-12 LAB — MAGNESIUM: Magnesium: 1.5 mg/dL — ABNORMAL LOW (ref 1.7–2.4)

## 2017-06-12 MED ORDER — FUROSEMIDE 10 MG/ML IJ SOLN
60.0000 mg | Freq: Once | INTRAMUSCULAR | Status: AC
  Administered 2017-06-12: 60 mg via INTRAVENOUS
  Filled 2017-06-12: qty 6

## 2017-06-12 MED ORDER — POTASSIUM CHLORIDE 10 MEQ/100ML IV SOLN
10.0000 meq | INTRAVENOUS | Status: AC
  Administered 2017-06-12 (×3): 10 meq via INTRAVENOUS
  Filled 2017-06-12 (×3): qty 100

## 2017-06-12 MED ORDER — MAGNESIUM SULFATE 4 GM/100ML IV SOLN
4.0000 g | Freq: Once | INTRAVENOUS | Status: AC
  Administered 2017-06-12: 4 g via INTRAVENOUS
  Filled 2017-06-12: qty 100

## 2017-06-12 MED ORDER — LACTULOSE ENEMA
300.0000 mL | Freq: Once | ORAL | Status: AC
  Administered 2017-06-12: 300 mL via RECTAL
  Filled 2017-06-12: qty 300

## 2017-06-12 MED ORDER — POTASSIUM CHLORIDE 20 MEQ/15ML (10%) PO SOLN
20.0000 meq | Freq: Two times a day (BID) | ORAL | Status: DC
  Administered 2017-06-12: 20 meq
  Filled 2017-06-12 (×2): qty 15

## 2017-06-12 MED ORDER — ORAL CARE MOUTH RINSE
15.0000 mL | Freq: Four times a day (QID) | OROMUCOSAL | Status: DC
  Administered 2017-06-12 – 2017-06-24 (×44): 15 mL via OROMUCOSAL

## 2017-06-12 MED ORDER — ORAL CARE MOUTH RINSE: 15.0000 mL | Freq: Two times a day (BID) | OROMUCOSAL | Status: DC

## 2017-06-12 MED ORDER — ALBUMIN HUMAN 5 % IV SOLN
12.5000 g | Freq: Once | INTRAVENOUS | Status: AC
  Administered 2017-06-12: 12.5 g via INTRAVENOUS
  Filled 2017-06-12: qty 250

## 2017-06-12 NOTE — Progress Notes (Signed)
CRITICAL VALUE ALERT  Critical Value:  Potassium 2.6  Date & Time Notied:  06/12/17 1054  Provider Notified: Dr. Quincy Simmonds  Orders Received/Actions taken: yes

## 2017-06-12 NOTE — Progress Notes (Signed)
This RN informed by MRI staff that the MRI ventilator is down.  Pt is vent dependent so MRI will need to be rescheduled once a replacement ventilator is in place.  Will pass on to day shift RN.

## 2017-06-12 NOTE — Plan of Care (Signed)
Problem: Health Behavior/Discharge Planning: Goal: Ability to manage health-related needs will improve Outcome: Progressing Patient unable to have a discussion at this time; palliative care has talked to the family according to previous shift and notes

## 2017-06-12 NOTE — Progress Notes (Signed)
PROGRESS NOTE [facility]   Nathan Frank   LMB:[id number] DOB: [birth year]  DOA: 05/28/2017 PCP: Thressa Sheller, MD   Brief Narrative:  Nathan Frank is a 76 year old male with past medical history of hepatitis C, alcohol abuse with cirrhosis, alcoholic cardiomyopathy and recently diagnosed with seizures. Presented to the hospital with weakness and left arm tremor he was admitted for suspected seizures to the medical floor on 8/24. Subsequently patient on 8/27 developed status epilepticus and altered mental status for which he required intubation for airway protection and was transferred to the ICU. He was also found to have elevated ammonia levels. Neurology was consulted, burst suppression was started on 8/28 which lasted for over 3 days. Long-term EEG was unremarkable for seizure from the focal distribution. At one point was hypotensive required, pressor for a short period. Patient was unable to be weaned off ventilator so tracheostomy was placed on 06/09/2017. Despite all interventions patient remains unresponsive. Neurology recommending further brain images and EEG. Given poor prognosis palliative care has been consulted.   Subjective: No changes, remains unresponsive   Assessment & Plan: Status epilepticus - status post burst suppression Neurology following AEDS per their recommendation Off sedation Follow-up MRI and EEG - still pending  Vimptat levels pending  Poor prognosis of meaningful recovery.   Hepatic encephalopathy  Hx Hep C with ETOH cirrhosis  Ammonia 71, will give a dose of rectal Lactulose  LFT's normal   Anasarca  Patient is 3rd spacing  Albumin low  Will give lasix and albumin  Ok UOP  Continue to monitor  Maintain negative balance  Check CXR   Respiratory compromise due to encephalopathy - s/p trach 9/5 Cont trach care  PS wean at tolerated   Alcoholic cardiomyopathy/?tachy fib intermittently - HR stable  EF 45% w severe TR  Initially was  hypotensive treated with pressor - now weaned off  Now with signs of fluid overload/3rd spacing - will give Lasix 40 mg x 1 and monitor  CXR shows pleural effusions Good urine output  On Amion for intermittent tachy fib with RVR  ? If need anticoagulation, would be concern due to liver dz and thrombocytopenia  Thrombocytopenia - likely for cirrhosis and acute illness  Plt continues to drop will d/c Lovenox, place SCD's   Hypokalemia/Hypomagnesemia  Replete  Check level in AM   Goals of care  Palliative care to have a meeting with family today. Regarding call of care, code status.   DVT prophylaxis: SCD's  Code Status: FULL  Family Communication: None at bedside  Disposition Plan: TBD    Consultants:   PCCM   Neurology   Procedures:   Rt IJ 8/28>>  Trach 9/5   Antimicrobials: Anti-infectives    None      Objective: Vitals:   06/12/17 0500 06/12/17 0508 06/12/17 0746 06/12/17 0757  BP:   (!) 110/98 (!) 110/98  Pulse:   (!) 129 (!) 112  Resp:   17 12  Temp:   97.7 F (36.5 C)   TempSrc:   Axillary   SpO2:   100% 100%  Weight: 91.3 kg (201 lb 4.5 oz) 91.3 kg (201 lb 4.5 oz)    Height:        Intake/Output Summary (Last 24 hours) at 06/12/17 0927 Last data filed at 06/12/17 0400  Gross per 24 hour  Intake          1368.67 ml  Output  1550 ml  Net          -181.33 ml   Filed Weights   06/11/17 0209 06/12/17 0500 06/12/17 0508  Weight: 89.9 kg (198 lb 3.1 oz) 91.3 kg (201 lb 4.5 oz) 91.3 kg (201 lb 4.5 oz)    Examination:  General: Unresponsive  Cardiovascular: RRR, M3/T5 + systolic murmurs  Respiratory: Trach clear, bibasilar crackles  Abdominal: Soft, NT, ND, bowel sounds + Extremities: Edematous in all extremities   Data Reviewed: I have personally reviewed following labs and imaging studies  CBC:  Recent Labs Lab 06/07/17 0418 06/08/17 0502 06/09/17 0418 06/11/17 0536 06/11/17 1330  WBC 6.5 7.7 10.6* 10.8* 12.1*  HGB 13.2  12.7* 12.2* 9.8* 9.4*  HCT 38.7* 37.3* 35.9* 29.0* 27.5*  MCV 89.8 90.1 89.5 89.2 88.7  PLT 194 211 213 136* 974*   Basic Metabolic Panel:  Recent Labs Lab 06/06/17 0436 06/07/17 0418 06/08/17 0502 06/09/17 0418 06/11/17 0536  NA 141 140 138 137 138  K 3.3* 3.3* 3.5 4.0 2.6*  CL 116* 116* 115* 112* 109  CO2 18* 18* 18* 19* 22  GLUCOSE 85 77 132* 110* 138*  BUN _0 5* 13  CREATININE 0.81 0.66 0.64 0.72 0.61  CALCIUM 8.3* 8.2* 8.4* 8.3* 8.1*  MG 1.7 1.5* 1.3* 1.8 1.4*  PHOS 2.8 2.8 3.0 3.1 2.5   GFR: Estimated Creatinine Clearance: 90.8 mL/min (by C-G formula based on SCr of 0.61 mg/dL). Liver Function Tests: No results for input(s): AST, ALT, ALKPHOS, BILITOT, PROT, ALBUMIN in the last 168 hours. No results for input(s): LIPASE, AMYLASE in the last 168 hours.  Recent Labs Lab 06/11/17 0536  AMMONIA 70*   Coagulation Profile:  Recent Labs Lab 06/07/17 0418 06/09/17 0418  INR 1.20 1.21   Cardiac Enzymes: No results for input(s): CKTOTAL, CKMB, CKMBINDEX, TROPONINI in the last 168 hours. BNP (last 3 results) No results for input(s): PROBNP in the last 8760 hours. HbA1C: No results for input(s): HGBA1C in the last 72 hours. CBG:  Recent Labs Lab 06/11/17 1212 06/11/17 1634 06/11/17 1959 06/12/17 0320 06/12/17 0744  GLUCAP 153* 152* 137* 125* 128*   Lipid Profile: No results for input(s): CHOL, HDL, LDLCALC, TRIG, CHOLHDL, LDLDIRECT in the last 72 hours. Thyroid Function Tests: No results for input(s): TSH, T4TOTAL, FREET4, T3FREE, THYROIDAB in the last 72 hours. Anemia Panel: No results for input(s): VITAMINB12, FOLATE, FERRITIN, TIBC, IRON, RETICCTPCT in the last 72 hours. Sepsis Labs: No results for input(s): PROCALCITON, LATICACIDVEN in the last 168 hours.  No results found for this or any previous visit (from the past 240 hour(s)).    Radiology Studies: Dg Abd Portable 1v  Result Date: 06/11/2017 CLINICAL DATA:  NG tube placement EXAM:  PORTABLE ABDOMEN - 1 VIEW COMPARISON:  06/07/2017 FINDINGS: Bilateral effusions and bibasilar consolidation. Enlarged heart size. Right-sided central venous catheter tip overlies the cavoatrial junction. Esophageal tube tip overlies the mid stomach, side-port projects slightly beyond the GE junction. IMPRESSION: 1. Esophageal tube tip projects over the mid stomach 2. Bibasilar consolidations and probable effusions Electronically Signed   By: Donavan Foil M.D.   On: 06/11/2017 21:38      Scheduled Meds: . chlorhexidine gluconate (MEDLINE KIT)  15 mL Mouth Rinse BID  . Chlorhexidine Gluconate Cloth  6 each Topical Daily  . enoxaparin (LOVENOX) injection  90 mg Subcutaneous Q12H  . feeding supplement (PRO-STAT SUGAR FREE 64)  30 mL Per Tube BID  . folic acid  1 mg  Intravenous Daily  . levothyroxine  25 mcg Intravenous Daily  . mouth rinse  15 mL Mouth Rinse 10 times per day  . pantoprazole (PROTONIX) IV  40 mg Intravenous Q24H  . phenytoin (DILANTIN) IV  100 mg Intravenous Q12H  . pregabalin  75 mg Per Tube BID  . sodium chloride flush  10-40 mL Intracatheter Q12H  . thiamine injection  100 mg Intravenous Daily   Continuous Infusions: . amiodarone 30 mg/hr (06/12/17 0032)  . dextrose 5 % and 0.45% NaCl 10 mL/hr (06/11/17 1423)  . feeding supplement (VITAL AF 1.2 CAL) 1,000 mL (06/11/17 1412)  . lacosamide (VIMPAT) IV Stopped (06/11/17 2326)  . levETIRAcetam Stopped (06/11/17 2226)     LOS: 14 days    Time spent: Total of 35 minutes spent with pt, greater than 50% of which was spent in discussion of  treatment, counseling and coordination of care    Chipper Oman, MD Pager: Text Page via [email or web address]   If 7PM-7AM, please contact night-coverage [email or web address] 06/12/2017, 9:27 AM

## 2017-06-12 NOTE — Consult Note (Signed)
Consultation Note Date: 06/12/2017   Patient Name: Nathan Frank  DOB: Mar 28, 1941  MRN: [id number]  Age / Sex: 76 y.o., male  PCP: Thressa Sheller, MD Referring Physician: Patrecia Pour, Christean Grief, MD  Reason for Consultation: Establishing goals of care  HPI/Patient Profile: Nathan Frank is a 76 y.o. male with medical history significant of CVA, residual L sided hemiparesis, and seizure disorder.  He has been in an SNF since just after discharge on the 13th. He returned due to seizure activity.    Clinical Assessment and Goals of Care:  Nathan Frank is unresponsive.  He has a tracheostomy and is on a ventilator. Respiratory therapy has unsuccessfully attempted to wean patient from the ventilator. He has a feeding tube in place.     Patient has a wife Nathan Frank) and 3 children. Wife and son Nathan Frank) present at meeting along with other family members. Nathan Frank  indicates he is helping to take care of his mother Nathan Frank who lives with him, as she has dementia and needs assistance.   He states that his father had indicated in a living will that he is currently attempting to locate, filled out at [facility], that his father would not want to be left in a vegetative state as he is currently in. However, he does state that he would like to give his father more of a chance to wake up. He states he wants to give him 1-2 months to become responsive. He defines responsive as awake, looking at him, and able to communicate via eye blinking or by hand squeeze. If he is not responsive after that time frame, they would look at withdrawing care. They acknowledge the seriousness of Nathan Frank state and that he could die at any point, and would like for him to remain a full code. He and his mother state they would like everything done for him during that time period except TPN by PICC.They would like a PEG tube placed when indicated and placement  at [facility] if possible.  We discussed poor prognosis and current trajectory and possible complications.        Wife and son are decision makers.     SUMMARY OF RECOMMENDATIONS   Full scope of treatment.  Code Status/Advance Care Planning:  Full code   Additional Recommendations (Limitations, Scope, Preferences):  Do not want PICC with TPN   Prognosis:   < 6 months Depends on responsiveness and continued ventilator support.   Discharge Planning: To Be Determined      Primary Diagnoses: Present on Admission: . Chronic systolic CHF (congestive heart failure) ([place]) . Complex partial status epilepticus ([place])   I have reviewed the medical record, interviewed the patient and family, and examined the patient. The following aspects are pertinent.  Past Medical History:  Diagnosis Date  . Abnormal TSH   . Acute gastric ulcer   . Acute gastritis with hemorrhage   . Alcohol abuse   . Alcohol dependence ([place]) 02/02/2014  . Anemia   . Bifascicular block   .  Cirrhosis ([place]) 06/04/2012  . Depressive disorder 02/01/2014  . Dizziness and giddiness 12/13/2014  . DVT of lower limb, acute ([place]) 06/06/2012  . DVT, lower extremity, recurrent ([place])    a. noted 2013. b. also dx 06/2016.  Marland Kitchen Essential hypertension   . Fatty liver   . Gallstones   . Gastritis 06/07/2012  . GERD (gastroesophageal reflux disease)   . GI bleed due to NSAIDs 12/13/2014  . Granulomatous gastritis   . Hematuria 06/04/2012  . Hepatitis C   . Hepatocellular carcinoma ([place])   . Heroin abuse    "I haven't done that since I don't know when."  . Heroin overdose 02/20/2014  . Neuropathy   . NICM (nonischemic cardiomyopathy) ([place])    a. 04/2015: EF 45-50% by cath. b. EF 40-45% by echo 06/2016.  . NSTEMI (non-ST elevated myocardial infarction) ([place])    a. 04/2015 - patent coronaries. Etiology possibly due to coronary spasm versus embolus, stress cardiomyopathy (atypical), and aborted infarction related to plaque rupture with  thrombosis and dissolution. Amlodipine started. Not on antiplatelets due to GIB/cirrhosis history.  . Oral thrush 06/05/2012  . Polysubstance abuse    THC, alcohol, heroin  . Prolonged Q-T interval on ECG    a. 12/2014 - treated with magnesium.  . Right knee pain 12/13/2014  . S/P alcohol detoxification 02/02/2014  . Stroke ([place]) 06/2016  . SVT (supraventricular tachycardia) ([place])    a. 12/2014 in setting of GIB, ETOH, NSAIDS, gastritis.  . Symptomatic cholelithiasis 12/15/2013  . Thrombocytopenia ([place])   . Upper GI bleeding 12/13/2014  . Weight loss 06/04/2012   Social History   Social History  . Marital status: Married    Spouse name: N/A  . Number of children: 3  . Years of education: N/A   Occupational History  . Retired Education officer, [employer]    Social History Main Topics  . Smoking status: Former Smoker    Packs/day: 0.50    Years: 5.00    Types: Cigarettes  . Smokeless tobacco: Never Used     Comment: "quit smoking cigarettes in the 1970's"  . Alcohol use No     Comment: Drinks at least one glass of wine per day-with last drink on 05/11/2017  . Drug use: No     Comment: former heroin user-- last used 3 months ago-  . Sexual activity: Not Currently    Birth control/ protection: Condom   Other Topics Concern  . None   Social History Narrative   Lost one son to a gunshot.  Lives with wife.     Family History  Problem Relation Age of Onset  . Stroke Father   . Prostate cancer Brother   . Heart disease Mother        Pacemaker  . Diabetes Neg Hx    Scheduled Meds: . chlorhexidine gluconate (MEDLINE KIT)  15 mL Mouth Rinse BID  . Chlorhexidine Gluconate Cloth  6 each Topical Daily  . feeding supplement (PRO-STAT SUGAR FREE 64)  30 mL Per Tube BID  . folic acid  1 mg Intravenous Daily  . levothyroxine  25 mcg Intravenous Daily  . mouth rinse  15 mL Mouth Rinse 10 times per day  . pantoprazole (PROTONIX) IV  40 mg Intravenous Q24H  . phenytoin (DILANTIN) IV  100 mg Intravenous  Q12H  . potassium chloride  20 mEq Per Tube BID  . pregabalin  75 mg Per Tube BID  . sodium chloride flush  10-40 mL Intracatheter Q12H  .  thiamine injection  100 mg Intravenous Daily   Continuous Infusions: . amiodarone 30 mg/hr (06/12/17 1026)  . dextrose 5 % and 0.45% NaCl 10 mL/hr (06/11/17 1423)  . feeding supplement (VITAL AF 1.2 CAL) 1,000 mL (06/12/17 1027)  . lacosamide (VIMPAT) IV Stopped (06/12/17 1048)  . levETIRAcetam Stopped (06/12/17 1022)   PRN Meds:.acetaminophen (TYLENOL) oral liquid 160 mg/5 mL, artificial tears, fentaNYL (SUBLIMAZE) injection, metoprolol tartrate, midazolam, [DISCONTINUED] ondansetron **OR** ondansetron (ZOFRAN) IV, sodium chloride flush Medications Prior to Admission:  Prior to Admission medications   Medication Sig Start Date End Date Taking? Authorizing Provider  Cholecalciferol (VITAMIN D-3) 5000 units TABS Take 5,000 Units by mouth daily.    Yes [provider]  dabigatran (PRADAXA) 150 MG CAPS capsule Take 1 capsule (150 mg total) by mouth every 12 (twelve) hours. 05/17/17  Yes Florencia Reasons, MD  folic acid (FOLVITE) 1 MG tablet Take 1 mg by mouth daily. 12/03/16  Yes [provider]  lactulose (CHRONULAC) 10 GM/15ML solution Take 15 mLs (10 g total) by mouth 2 (two) times daily. 05/17/17  Yes Florencia Reasons, MD  levETIRAcetam (KEPPRA) 1000 MG tablet Take 1,000 mg by mouth 2 (two) times daily. 05/17/17  Yes [provider]  levothyroxine (SYNTHROID, LEVOTHROID) 50 MCG tablet Take 50 mcg by mouth daily before breakfast.  12/29/16  Yes [provider]  magnesium oxide (MAG-OX) 400 (241.3 Mg) MG tablet Take 1 tablet (400 mg total) by mouth daily. 01/25/17  Yes Fay Records, MD  Melatonin 5 MG CAPS Take 5 mg by mouth at bedtime.    Yes [provider]  metoprolol tartrate (LOPRESSOR) 25 MG tablet Take 0.5 tablets (12.5 mg total) by mouth 2 (two) times daily. 11/15/16 05/28/17 Yes Reyne Dumas, MD  thiamine 100 MG tablet Take  100 mg by mouth daily.   Yes [provider]   Allergies  Allergen Reactions  . Nsaids Other (See Comments)    Acute gastric ulcers  . Oxycodone Other (See Comments)    Per the risk calculating tool RIOSORD: (Risk Index for Overdose or Serious Opioid -induced Respiratory Depression Risk ) calculated risk in ensuing 6 months  = 83% ( see Problem List for discussion)   Review of Systems  Unable to perform ROS   Physical Exam  Constitutional: He appears well-developed. No distress.  HENT:  Head: Normocephalic.  Cardiovascular:  Warm and dry  Pulmonary/Chest:  Trach with ventilator  Abdominal: Soft.  Musculoskeletal: He exhibits edema.  Neurological:  Unresponsive    Vital Signs: BP 104/66 (BP Location: Left Arm)   Pulse 99   Temp (!) 97.4 F (36.3 C) (Axillary)   Resp 13   Ht 5' 11"  (1.803 m)   Wt 91.3 kg (201 lb 4.5 oz)   SpO2 99%   BMI 28.07 kg/m  Pain Assessment: CPOT   Pain Score: 0-No pain   SpO2: SpO2: 99 % O2 Device:SpO2: 99 % O2 Flow Rate: .   IO: Intake/output summary:  Intake/Output Summary (Last 24 hours) at 06/12/17 1557 Last data filed at 06/12/17 1147  Gross per 24 hour  Intake          1378.67 ml  Output             1600 ml  Net          -221.33 ml    LBM: Last BM Date: 06/11/17 Baseline Weight: Weight: 81.6 kg (180 lb) Most recent weight: Weight: 91.3 kg (201 lb 4.5 oz)  Palliative Assessment/Data: 10%   Flowsheet Rows     Most Recent Value  Intake Tab  Referral Department  Hospitalist  Unit at Time of Referral  ICU  Palliative Care Primary Diagnosis  Other (Comment) [liver disease]  Date Notified  06/11/17  Palliative Care Type  New Palliative care  Reason for referral  Clarify Goals of Care  Date of Admission  05/28/17  # of days IP prior to Palliative referral  14  Clinical Assessment  Psychosocial & Spiritual Assessment  Palliative Care Outcomes      Time In: 2:30pm Time Out: 3:30 pm Time Total: 1  hour Greater than 50%  of this time was spent counseling and coordinating care related to the above assessment and plan.  Signed by: Asencion Gowda, NP   Please contact Palliative Medicine Team phone at [phone] for questions and concerns.  For individual provider: See Shea Evans

## 2017-06-12 NOTE — Plan of Care (Signed)
Problem: Nutrition: Goal: Adequate nutrition will be maintained Outcome: Progressing Patient tolerating tube feeding at 102ml/hr

## 2017-06-13 DIAGNOSIS — J9602 Acute respiratory failure with hypercapnia: Secondary | ICD-10-CM

## 2017-06-13 LAB — COMPREHENSIVE METABOLIC PANEL
ALBUMIN: 1.6 g/dL — AB (ref 3.5–5.0)
ALT: 20 U/L (ref 17–63)
AST: 57 U/L — AB (ref 15–41)
Alkaline Phosphatase: 65 U/L (ref 38–126)
Anion gap: 5 (ref 5–15)
BILIRUBIN TOTAL: 0.8 mg/dL (ref 0.3–1.2)
BUN: 22 mg/dL — AB (ref 6–20)
CO2: 24 mmol/L (ref 22–32)
CREATININE: 0.84 mg/dL (ref 0.61–1.24)
Calcium: 8.1 mg/dL — ABNORMAL LOW (ref 8.9–10.3)
Chloride: 107 mmol/L (ref 101–111)
GFR calc Af Amer: >60 mL/min (ref >60)
GFR calc non Af Amer: >60 mL/min (ref >60)
GLUCOSE: 146 mg/dL — AB (ref 65–99)
POTASSIUM: 3 mmol/L — AB (ref 3.5–5.1)
Sodium: 136 mmol/L (ref 135–145)
TOTAL PROTEIN: 5.3 g/dL — AB (ref 6.5–8.1)

## 2017-06-13 LAB — MAGNESIUM: MAGNESIUM: 2 mg/dL (ref 1.7–2.4)

## 2017-06-13 LAB — CBC
HCT: 24.5 % — ABNORMAL LOW (ref 39.0–52.0)
HEMOGLOBIN: 8.4 g/dL — AB (ref 13.0–17.0)
MCH: 30.2 pg (ref 26.0–34.0)
MCHC: 34.3 g/dL (ref 30.0–36.0)
MCV: 88.1 fL (ref 78.0–100.0)
Platelets: 112 10*3/uL — ABNORMAL LOW (ref 150–400)
RBC: 2.78 MIL/uL — ABNORMAL LOW (ref 4.22–5.81)
RDW: 17.4 % — ABNORMAL HIGH (ref 11.5–15.5)
WBC: 10.4 10*3/uL (ref 4.0–10.5)

## 2017-06-13 LAB — GLUCOSE, CAPILLARY
GLUCOSE-CAPILLARY: 131 mg/dL — AB (ref 65–99)
GLUCOSE-CAPILLARY: 146 mg/dL — AB (ref 65–99)
Glucose-Capillary: 141 mg/dL — ABNORMAL HIGH (ref 65–99)
Glucose-Capillary: 125 mg/dL — ABNORMAL HIGH (ref 65–99)
Glucose-Capillary: 125 mg/dL — ABNORMAL HIGH (ref 65–99)
Glucose-Capillary: 141 mg/dL — ABNORMAL HIGH (ref 65–99)

## 2017-06-13 LAB — ALBUMIN: ALBUMIN: 1.6 g/dL — AB (ref 3.5–5.0)

## 2017-06-13 LAB — AMMONIA: Ammonia: 89 umol/L — ABNORMAL HIGH (ref 9–35)

## 2017-06-13 LAB — PHOSPHORUS: Phosphorus: 2.1 mg/dL — ABNORMAL LOW (ref 2.5–4.6)

## 2017-06-13 MED ORDER — SULFAMETHOXAZOLE-TRIMETHOPRIM 400-80 MG PO TABS
1.0000 | ORAL_TABLET | Freq: Two times a day (BID) | ORAL | Status: DC
  Administered 2017-06-13 – 2017-06-15 (×5): 1 via ORAL
  Filled 2017-06-13 (×5): qty 1

## 2017-06-13 MED ORDER — SODIUM CHLORIDE 0.9 % IV SOLN
1000.0000 mg | Freq: Two times a day (BID) | INTRAVENOUS | Status: DC
  Administered 2017-06-13 – 2017-06-21 (×16): 1000 mg via INTRAVENOUS
  Filled 2017-06-13 (×17): qty 10

## 2017-06-13 MED ORDER — LACTULOSE 10 GM/15ML PO SOLN
30.0000 g | Freq: Three times a day (TID) | ORAL | Status: DC
  Administered 2017-06-13 – 2017-06-17 (×14): 30 g via ORAL
  Filled 2017-06-13 (×14): qty 45

## 2017-06-13 MED ORDER — POTASSIUM CHLORIDE 20 MEQ PO PACK
40.0000 meq | PACK | ORAL | Status: AC
  Administered 2017-06-13 (×2): 40 meq via ORAL
  Filled 2017-06-13 (×2): qty 2

## 2017-06-13 NOTE — Progress Notes (Signed)
PROGRESS NOTE    Nathan Frank  VPX:[id number] DOB: [birth year] DOA: 05/28/2017 PCP: Thressa Sheller, MD    Brief Narrative:  76 year old male presented with tremors from the skilled nursing facility. Patient is known to have cerebrovascular accident with left hemiparesis, recently diagnosed with partial seizures, complicated with status epilepticus. He has history of alcohol abuse, hep C, cirrhosis, and heroin abuse. His tremors were mainly on the left arm and left foot, persistent. On initial physical examination blood pressure 146/89, heart rate 96, respirations 16, oxygen saturation 100%, he had moist mucous membranes, his lungs were clear to auscultation bilaterally, no wheezing, rales or rhonchi, heart S1-S2 present and rhythmic, abdomen was soft nontender, no lower extremity edema, he was noted to have left-sided weakness and positive tremor. Patient was admitted to the hospital with working diagnosis of uncontrolled seizures/ partial status epilepticus. August 27 he developed worsening status epilepticus, required intubation for airway protection. He was placed on burst suppression for 3 days. Patient has not regained consciousness despite modification of antiseizure medications. Unable to be liberated from mechanical ventilation, he underwent tracheostomy 06/09/17. Patient has deemed to be poor prognosis, pending follow-up MRI and EEG.   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 Status epilepticus. Patient continue to be non responsive, will continue supportive medical care with nutrition per NG tube, that is tolerating well. Case discussed  with neurology, Dr Rory Percy, will continue anti-seizure regimen with lacosamide and keppra. Plan for MRI in am. Continue neuro checks per unit protocol. Continue thiamine, pregabalin, and folic acid. Patient with positive oculocephalic and corneal reflexes.  2. Hepatic portosystemic encephalopathy, in the setting of cirrhosis (alcohol/hepatitis C). Patient continue to be unresponsive, will resume lactulose therapy, scheduled for now, check ammonia level in am. Continue tube feedings.   3. Respirations failure due to CNS dysfunction. Unable to be liberated from mechanical ventilation, sp tracheostomy. On mechanical ventilation. PRVC, tV600, Peep 5, RR 12 and Fi02 30%. Brief trial on PSV of 12 cmH20 with 5 cm H20 of peep, resulted in tV 400, with respiratory rate of 24. Patient not apneic.    4. Nonischemic cardiomyopathy. Stable with no signs of volume overload, continue amiodarone, will hold on further diuresis, diffuse edema, likely due to hypoproteinemia and decreased oncotic pressure. Received albumin IV 09/08.  5. History of CVA. Continue supportive medical care.   6. Thrombocytopenia. [place] reactive, will continue close follow up on cell count, today at 112 from 126. No bleeding.  7. NEW Neck cellulitis. Noted erythema, edema and purulence at the site of the tracheostomy, will continue local wound care, will start enteral bactrim and follow clinical response.   8. Hypothyroid. Continue levothyroxine  9. Hypokalemia. Continue repletion with kcl, today 80 meq in 2 divided doses, follow renal panel in am, renal function preserved with serum cr at 0.84.    DVT prophylaxis: enoxaparin  Code Status: Full Family Communication:  Disposition Plan: to be determined   Consultants:   Neurology  Critical  Care  Procedures:   Tracheostomy 09/05  Antimicrobials:       Subjective: Patient continue to be non responsive to voice, touch or pain  stimuli. Tolerating well tube feedings. Noted  secretions on upper airway. Positive generalized pitting edema. Positive erythema around tracheostomy, with edea, and purulent drainage.   Objective: Vitals:   06/13/17 0500 06/13/17 0600 06/13/17 0700 06/13/17 0703  BP: 102/86 90/63 100/60   Pulse: (!) 111 (!) 114 100   Resp: 12 15 12    Temp:  98.5 F (36.9 C)  98 F (36.7 C)  TempSrc:  Oral  Oral  SpO2: 99% 100% 100%   Weight:      Height:        Intake/Output Summary (Last 24 hours) at 06/13/17 0744 Last data filed at 06/13/17 3435  Gross per 24 hour  Intake          3145.85 ml  Output             1275 ml  Net          1870.85 ml   Filed Weights   06/12/17 0500 06/12/17 0508 06/13/17 0328  Weight: 91.3 kg (201 lb 4.5 oz) 91.3 kg (201 lb 4.5 oz) 91.4 kg (201 lb 8 oz)    Examination:  General: deconditioned and ill looking appearing. Neurology: Not responsive, positive oculocephalic and corneal reflex.  E ENT: mild pallor, no icterus, oral mucosa dry. NG tube in place. Tracheostomy in place.  Cardiovascular: S1-S2 present, rhythmic, no gallops, rubs, or murmurs. No jugular venous distention, positive edema generealized ++ pitting.  Pulmonary: vesicular breath sounds bilaterally, adequate air movement, no wheezing, rhonchi or rales. Gastrointestinal. Abdomen flat, no organomegaly, non tender, no rebound or guarding Skin. Tracheostomy site with erythema and edema, positive purulent drainage.  Musculoskeletal: no joint deformities     Data Reviewed: I have personally reviewed following labs and imaging studies  CBC:  Recent Labs Lab 06/09/17 0418 06/11/17 0536 06/11/17 1330 06/12/17 1009 06/13/17 0230  WBC 10.6* 10.8* 12.1* 9.4 10.4  NEUTROABS  --   --   --  6.8  --   HGB 12.2* 9.8* 9.4* 8.9* 8.4*  HCT 35.9* 29.0* 27.5* 26.3* 24.5*  MCV 89.5 89.2 88.7 88.3 88.1  PLT 213 136* 135* 126* 686*   Basic Metabolic Panel:  Recent Labs Lab 06/07/17 0418 06/08/17 0502 06/09/17 0418 06/11/17 0536 06/12/17 1009  06/13/17 0230  NA 140 138 137 138 136 136  K 3.3* 3.5 4.0 2.6* 2.6* 3.0*  CL 116* 115* 112* 109 106 107  CO2 18* 18* 19* 22 23 24   GLUCOSE 77 132* 110* 138* 153* 146*  BUN 10 9 5* 13 17 22*  CREATININE 0.66 0.64 0.72 0.61 0.77 0.84  CALCIUM 8.2* 8.4* 8.3* 8.1* 8.1* 8.1*  MG 1.5* 1.3* 1.8 1.4* 1.5* 2.0  PHOS 2.8 3.0 3.1 2.5  --  2.1*   GFR: Estimated Creatinine Clearance: 86.5 mL/min (by C-G formula based on SCr of 0.84 mg/dL). Liver Function Tests:  Recent Labs Lab 06/12/17 1009 06/13/17 0230  AST 66* 57*  ALT 21 20  ALKPHOS 61 65  BILITOT 0.7 0.8  PROT 5.0* 5.3*  ALBUMIN 1.5* 1.6*   No results for input(s): LIPASE, AMYLASE in the last 168 hours.  Recent Labs Lab 06/11/17 0536 06/12/17 1009 06/13/17 0230  AMMONIA 70* 71* 89*   Coagulation Profile:  Recent Labs Lab 06/07/17 0418 06/09/17 0418  INR 1.20 1.21   Cardiac Enzymes: No results for input(s): CKTOTAL, CKMB, CKMBINDEX, TROPONINI in the last 168 hours. BNP (last 3 results) No results for input(s): PROBNP in the last 8760 hours. HbA1C: No results  for input(s): HGBA1C in the last 72 hours. CBG:  Recent Labs Lab 06/12/17 1142 06/12/17 1550 06/12/17 2020 06/12/17 2329 06/13/17 0330  GLUCAP 139* 155* 154* 141* 125*   Lipid Profile: No results for input(s): CHOL, HDL, LDLCALC, TRIG, CHOLHDL, LDLDIRECT in the last 72 hours. Thyroid Function Tests: No results for input(s): TSH, T4TOTAL, FREET4, T3FREE, THYROIDAB in the last 72 hours. Anemia Panel: No results for input(s): VITAMINB12, FOLATE, FERRITIN, TIBC, IRON, RETICCTPCT in the last 72 hours.    Radiology Studies: I have reviewed all of the imaging during this hospital visit personally     Scheduled Meds: . chlorhexidine gluconate (MEDLINE KIT)  15 mL Mouth Rinse BID  . Chlorhexidine Gluconate Cloth  6 each Topical Daily  . feeding supplement (PRO-STAT SUGAR FREE 64)  30 mL Per Tube BID  . folic acid  1 mg Intravenous Daily  .  levothyroxine  25 mcg Intravenous Daily  . mouth rinse  15 mL Mouth Rinse QID  . pantoprazole (PROTONIX) IV  40 mg Intravenous Q24H  . phenytoin (DILANTIN) IV  100 mg Intravenous Q12H  . potassium chloride  20 mEq Per Tube BID  . pregabalin  75 mg Per Tube BID  . sodium chloride flush  10-40 mL Intracatheter Q12H  . thiamine injection  100 mg Intravenous Daily   Continuous Infusions: . amiodarone 30 mg/hr (06/13/17 0550)  . dextrose 5 % and 0.45% NaCl 10 mL/hr (06/11/17 1423)  . feeding supplement (VITAL AF 1.2 CAL) 1,000 mL (06/13/17 0550)  . lacosamide (VIMPAT) IV Stopped (06/12/17 2304)  . levETIRAcetam Stopped (06/12/17 2051)     LOS: 15 days        Nathan Tally Gerome Apley, MD [facility] Pager [phone]

## 2017-06-13 NOTE — Progress Notes (Signed)
Subjective Patient seen and examined this morning. No change in exam per overnight nursing staff.  Objective Vitals:   06/13/17 1127 06/13/17 1144  BP: 108/74   Pulse: 99   Resp: (!) 23   Temp:  98 F (36.7 C)  SpO2: 100%   On no sedation Trached Does not open eyes to loud voice or noxious stimulus. Pupils are round reactive. Oculocephalics+ Corneals present No withdrawal in any ext.  Assessment 76 year old man, history of old stroke, presented with partial status epilepticus. He was in burst suppression for over 3 days and required multiple antiepileptics to control his partial seizures. Since coming off of sedation, his exam is remained poor and he continues to remain unresponsive. He did have deranged hepatic function and might have been taking longer to metabolize the sedating medications than usual. His phenytoin levels unit a reduced dose continue to be supratherapeutic. We have backed down on his antiepileptics but the exam is remained unchanged.  Impression Evaluate for possible ongoing underlying seizure activity Evaluate for any structural lesion including stroke or anoxic brain injury Persistent encephalopathy-multifactorial  Recommendations MRI of the brain has been ordered since 06/11/2017 but there are some technical difficulties and scanning patients on ventilator and hence this has not happened now. Would like to get the MRI as soon as possible. Continue Vimpat at the reduced dose of 50 mg twice a day. Reduce Keppra to 1 g twice a day. Spoke with pharmacy regarding phenytoin dosing. Hold Dilantin for now and check the level this evening at 8pm. I have discontinued the Dilantin order for now and it will need to be reordered once the level drops to the therapeutic range. Check albumin Pharmacy consult for Dilantin Routine EEG tomorrow Palliative on board. Had a family meeting. Family would like to give him more time before making any decisions. We will follow  with you.  -- Amie [place], MD [facility] [phone]  If 7pm to 7am, please call on call as listed on AMION.

## 2017-06-13 NOTE — Progress Notes (Signed)
RT Note: RT was notified by previous RT of swelling and redness around stoma site, once examined RT found that swelling and redness was present. RT will follow up with nurse in regards to what the care plan is. Patient is resting comfortably, vitals are stable, RT will continue to monitor.

## 2017-06-13 NOTE — Progress Notes (Signed)
RT Note: RT notified Sarah, Critical Care NP that the patient's stoma site is reddened and has some edema. Patients RN, Elmyra Ricks stated that the site was red yesterday but is worse today. Rt wanted to ensure that the MD/NP was made aware so that they can address it. Judson Roch, NP said that she will contact Dr. Halford Chessman to look at it and address it. Rt will continue to monitor and assist as needed.

## 2017-06-14 ENCOUNTER — Inpatient Hospital Stay (HOSPITAL_COMMUNITY): Payer: Medicare Other

## 2017-06-14 LAB — GLUCOSE, CAPILLARY
GLUCOSE-CAPILLARY: 127 mg/dL — AB (ref 65–99)
GLUCOSE-CAPILLARY: 119 mg/dL — AB (ref 65–99)
GLUCOSE-CAPILLARY: 154 mg/dL — AB (ref 65–99)
Glucose-Capillary: 128 mg/dL — ABNORMAL HIGH (ref 65–99)
Glucose-Capillary: 130 mg/dL — ABNORMAL HIGH (ref 65–99)
Glucose-Capillary: 131 mg/dL — ABNORMAL HIGH (ref 65–99)
Glucose-Capillary: 139 mg/dL — ABNORMAL HIGH (ref 65–99)

## 2017-06-14 LAB — BASIC METABOLIC PANEL
ANION GAP: 6 (ref 5–15)
BUN: 23 mg/dL — AB (ref 6–20)
CHLORIDE: 110 mmol/L (ref 101–111)
CO2: 24 mmol/L (ref 22–32)
Calcium: 8.5 mg/dL — ABNORMAL LOW (ref 8.9–10.3)
Creatinine, Ser: 0.73 mg/dL (ref 0.61–1.24)
GFR calc Af Amer: >60 mL/min (ref >60)
Glucose, Bld: 131 mg/dL — ABNORMAL HIGH (ref 65–99)
POTASSIUM: 3.5 mmol/L (ref 3.5–5.1)
SODIUM: 140 mmol/L (ref 135–145)

## 2017-06-14 LAB — AMMONIA: AMMONIA: 70 umol/L — AB (ref 9–35)

## 2017-06-14 LAB — PHENYTOIN LEVEL, TOTAL: PHENYTOIN LVL: 7.7 ug/mL — AB (ref 10.0–20.0)

## 2017-06-14 MED ORDER — PHENYTOIN SODIUM 50 MG/ML IJ SOLN
100.0000 mg | Freq: Two times a day (BID) | INTRAMUSCULAR | Status: DC
Start: 2017-06-14 — End: 2017-06-21
  Administered 2017-06-14 – 2017-06-21 (×14): 100 mg via INTRAVENOUS
  Filled 2017-06-14 (×14): qty 2

## 2017-06-14 MED ORDER — METOPROLOL TARTRATE 25 MG PO TABS
25.0000 mg | ORAL_TABLET | Freq: Three times a day (TID) | ORAL | Status: DC
  Administered 2017-06-14 – 2017-06-17 (×9): 25 mg via NASOGASTRIC
  Filled 2017-06-14 (×10): qty 1

## 2017-06-14 MED ORDER — ALBUMIN HUMAN 25 % IV SOLN
12.5000 g | Freq: Three times a day (TID) | INTRAVENOUS | Status: AC
  Administered 2017-06-14 – 2017-06-15 (×3): 12.5 g via INTRAVENOUS
  Filled 2017-06-14 (×3): qty 50

## 2017-06-14 MED ORDER — PHENYTOIN SODIUM 50 MG/ML IJ SOLN: 100.0000 mg | Freq: Two times a day (BID) | INTRAMUSCULAR | Status: DC

## 2017-06-14 MED ORDER — POTASSIUM CHLORIDE 20 MEQ PO PACK
40.0000 meq | PACK | Freq: Once | ORAL | Status: AC
  Administered 2017-06-14: 40 meq via NASOGASTRIC
  Filled 2017-06-14: qty 2

## 2017-06-14 MED ORDER — POTASSIUM CHLORIDE 20 MEQ/15ML (10%) PO SOLN
40.0000 meq | Freq: Once | ORAL | Status: AC
Start: 2017-06-14 — End: 2017-06-14
  Administered 2017-06-14: 40 meq
  Filled 2017-06-14: qty 30

## 2017-06-14 MED ORDER — FUROSEMIDE 10 MG/ML IJ SOLN
40.0000 mg | Freq: Three times a day (TID) | INTRAMUSCULAR | Status: AC
  Administered 2017-06-14 – 2017-06-15 (×3): 40 mg via INTRAVENOUS
  Filled 2017-06-14 (×3): qty 4

## 2017-06-14 NOTE — Progress Notes (Signed)
RT Note: RT spoke with MD about trach site being red and swollen, MD said they started antibiotics and continue to watch for any change. RT attempted wean again with CPAP 5 PS 15 patient RR went in to 40's so RT stopped and placed patient back on full support. Patients vitals are stable and patient is resting comfortably, RT will continue to monitor.

## 2017-06-14 NOTE — Progress Notes (Signed)
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  Intubated for airway.  Failed weaning and required trach.  Hx of ETOH and Hep C with cirrhosis, heroin abuse, hepatic encephalopathy, GIB, thrombocytopenia, non ischemic CM.  Subjective: Failed weaning attempt  Vital signs: BP 119/83   Pulse (!) 102   Temp 100.2 F (37.9 C) (Oral)   Resp (!) 27   Ht 5\' 11"  (1.803 m)   Wt 202 lb 13.2 oz (92 kg)   SpO2 100%   BMI 28.29 kg/m   Intake/output:  Intake/Output Summary (Last 24 hours) at 06/14/17 1537 Last data filed at 06/14/17 1500  Gross per 24 hour  Intake          2900.16 ml  Output             1275 ml  Net          1625.16 ml     General appearance:  [age]Year old  Male,cachectic  NAD, does not f/c  Eyes: anicteric sclerae, moist conjunctivae; PERRL, EOMI bilaterally. Mouth:  membranes and no mucosal ulcerations;  Neck: Trachea midline; neck supple, no JVD, there is marked induration and raised area about the size of a quarter located at ~ 11 thru 1 o'clock superior to the trach stoma, there is thick yellow secretions from this.  Lungs/chest: scattered rhonchi, with normal respiratory effort and no intercostal retractions CV: RRR, no MRGs  Abdomen: Soft, non-tender; no masses or HSM Extremities: diffuse anasarca  Skin: Normal temperature, turgor and texture; no rash, ulcers or subcutaneous nodules Psych: awake, does not f/c or interact    CMP Latest Ref Rng & Units 06/14/2017 06/13/2017 06/12/2017  Glucose 65 - 99 mg/dL 131(H) 146(H) 153(H)  BUN 6 - 20 mg/dL 23(H) 22(H) 17  Creatinine 0.61 - 1.24 mg/dL 0.73 0.84 0.77  Sodium 135 - 145 mmol/L 140 136 136  Potassium 3.5 - 5.1 mmol/L 3.5 3.0(L) 2.6(LL)  Chloride 101 - 111 mmol/L 110 107 106  CO2 22 - 32 mmol/L 24 24 23   Calcium 8.9 - 10.3 mg/dL 8.5(L) 8.1(L) 8.1(L)  Total Protein 6.5 - 8.1 g/dL - 5.3(L) 5.0(L)  Total  Bilirubin 0.3 - 1.2 mg/dL - 0.8 0.7  Alkaline Phos 38 - 126 U/L - 65 61  AST 15 - 41 U/L - 57(H) 66(H)  ALT 17 - 63 U/L - 20 21    CBC Latest Ref Rng & Units 06/13/2017 06/12/2017 06/11/2017  WBC 4.0 - 10.5 K/uL 10.4 9.4 12.1(H)  Hemoglobin 13.0 - 17.0 g/dL 8.4(L) 8.9(L) 9.4(L)  Hematocrit 39.0 - 52.0 % 24.5(L) 26.3(L) 27.5(L)  Platelets 150 - 400 K/uL 112(L) 126(L) 135(L)    ABG    Component Value Date/Time   PHART 7.397 06/09/2017 0342   PCO2ART 27.2 (L) 06/09/2017 0342   PO2ART 71.0 (L) 06/09/2017 0342   HCO3 16.9 (L) 06/09/2017 0342   TCO2 18 (L) 06/09/2017 0342   ACIDBASEDEF 7.0 (H) 06/09/2017 0342   O2SAT 95.0 06/09/2017 0342    CBG (last 3)   Recent Labs  06/14/17 0325 06/14/17 0750 06/14/17 1216  GLUCAP 127* 119* 130*     Dg Chest Port 1 View  Result Date: 06/14/2017 CLINICAL DATA:  Follow-up pleural effusion. History of cirrhosis, chronic CHF, aspiration, former smoker. EXAM: PORTABLE CHEST 1 VIEW COMPARISON:  Portable chest x-ray of June 12, 2017 FINDINGS: The tracheostomy tube tip projects  at the inferior margin of the clavicular heads. The lungs are reasonably well inflated. There are bibasilar densities consistent with posterior layering pleural effusions. The cardiac silhouette is enlarged. The central pulmonary vascularity is mildly prominent. The esophagogastric tube tip likely projects below the inferior margin of the image but the inferior aspect is not included in the field of view. The right internal jugular venous catheter tip projects over the distal third of the SVC. IMPRESSION: Bilateral pleural effusions layering posteriorly. Bibasilar atelectasis or pneumonia is not excluded. Low-grade CHF. The support tubes are in reasonable position though the tip of the esophagogastric tube cannot be definitely discerned. Electronically Signed   By: David  Martinique M.D.   On: 06/14/2017 07:22     Lines/tubes: Lurline Idol 9/05 [facility] Bible)  >>   Assessment/plan:  Stroke Encephalopathy  Cirrhosis  Hep C H/o CVA afib  Seizures (status) Compromised airway. Failure to wean s/p tracheostomy. 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 Evolving abscess  Pulm problem list  Trach/vent dependent w/ failure to wean in setting of volume overload, encephalopathy, protein calorie malnutrition and severe deconditioning.  -he is NOT a candidate for decannulation -hope we can get his volume status to a point where he can come off vent.   Interval hx from 9/6 to 9/10 Seen by palliative; patients family indicated that they want all aggressive therapy done including PEG tube.  Was in burst suppression for > 3 d that required several sedating meds and anti-convulsants. This is likely complicating MS.  Now more awake but not interactive  PCXR personally reviewed: trach good position. Bilateral effusions/atx  Failed weaning attempt. I think r/t volume status  Plan/rec Cont full vent support w/ daily attempt at weaning Will add 24 hrs of lasix and albumin-->need to get negative volume status he is > 10 liters positive. Agree w/ empiric abx for neck site cellulitis (now day #2 bactrim); if this doesn't improve we may need to [place] and see if there is abscess that can be drained.   -all other care per med service  -I am concerned thatn he may not come off vent.   Erick Colace ACNP-BC [facility] Pager # [phone] OR # [phone] if no answer

## 2017-06-14 NOTE — Procedures (Signed)
ELECTROENCEPHALOGRAM REPORT  Date of Study: 06/14/2017  Patient's Name: Nathan Frank MRN: [id number] Date of Birth: 03-25-1941  Referring Provider: Etta Quill, PA-C  Clinical History: This is a 76 year old man with recent status epilepticus, follow-up EEG  Medications: Lacosamide Levetiracetam Dilantin Lyrica Acetaminophen Amiodarone Dabigatran Folic acid Synthroid Metoprolol Bactrim Thiamine  Technical Summary: A multichannel digital EEG recording measured by the international 10-20 system with electrodes applied with paste and impedances below 5000 ohms performed as portable with EKG monitoring in an unresponsive patient s/p tracheostomy, off sedation.  Hyperventilation and photic stimulation were not performed.  The digital EEG was referentially recorded, reformatted, and digitally filtered in a variety of bipolar and referential montages for optimal display.   Description: The patient is unresponsive during the recording. There is no clear posterior dominant rhythm. The background consists of a large amount of diffuse 4-5 Hz theta and 2-3 Hz delta slowing, with suppression of faster frequencies over the right hemisphere. There is occasional sharply contoured slowing seen over the right hemisphere. Normal sleep architecture is not seen. Hyperventilation and photic stimulation were not performed.  There were no epileptiform discharges or electrographic seizures seen.    EKG lead showed irregular rhythm.  Impression: This EEG is abnormal due to the presence of: 1. Moderate to severe diffuse slowing of the background 2. Suppression over the right hemisphere  Clinical Correlation of the above findings indicates bilateral cerebral dysfunction, right greater than left, that is non-specific in etiology and can be seen with hypoxic/ischemic injury, toxic/metabolic encephalopathies, or medication effect. Suppression over the right hemisphere indicates focal cerebral dysfunction in  this region suggestive of underlying structural or physiologic abnormality. The absence of epileptiform discharges does not rule out a clinical diagnosis of epilepsy.  Clinical correlation is advised.   Ellouise Newer, M.D.

## 2017-06-14 NOTE — Plan of Care (Signed)
Problem: Education: Goal: Knowledge of [facility] Education information/materials will improve Outcome: Progressing Patient given new medication but unable to have any teach back at this time due to unresponsive state at this time.

## 2017-06-14 NOTE — Progress Notes (Signed)
Subjective: Patient resting and does not respond to verbal or noxious stimuli. Continue to be trached. No overt seizure activity noted. No change over night.   Objective: Current vital signs: BP 124/80   Pulse (!) 130   Temp 98 F (36.7 C) (Oral)   Resp 17   Ht _0  (1.803 m)   Wt 92 kg (202 lb 13.2 oz)   SpO2 100%   BMI 28.29 kg/m  Vital signs in last 24 hours: Temp:  [97.6 F (36.4 C)-99.9 F (37.7 C)] 98 F (36.7 C) (09/10 0751) Pulse Rate:  [99-130] 130 (09/10 0751) Resp:  [12-23] 17 (09/10 0751) BP: (102-124)/(57-80) 124/80 (09/10 0751) SpO2:  [98 %-100 %] 100 % (09/10 0751) FiO2 (%):  [30 %] 30 % (09/10 0731) Weight:  [92 kg (202 lb 13.2 oz)] 92 kg (202 lb 13.2 oz) (09/10 0244)  Intake/Output from previous day: 09/09 0701 - 09/10 0700 In: 1966.4 [I.V.:607.1; NG/GT:1189.3; IV Piggyback:170] Out: 1175 [Urine:1175] Intake/Output this shift: Total I/O In: -  Out: 250 [Urine:250] Nutritional status:    ROS:                                                                                                                                       History obtained from unobtainable from patient due to mental status  Neurologic Exam: Mental Status: Patient does not respond to verbal stimuli.  Does not respond to deep sternal rub.  Does not follow commands.  No verbalizations noted.  Cranial Nerves: II: patient does not respond confrontation bilaterally, pupils right 5 mm, left 5 mm,and briskly reactive bilaterally III,IV,VI: doll's response absent bilaterally. Cold caloric responses absent bilaterally. V,VII: corneal reflex reduced bilaterally  VIII: patient does not respond to verbal stimuli IX,X: gag reflex not tested, XI: trapezius strength unable to test bilaterally XII: tongue strength unable to test Motor: No withdrawal from noxious stimuli --no increased tone Sensory: Does not respond to noxious stimuli in any extremity. Deep Tendon Reflexes:  Absent  throughout. Plantars: absent bilaterally Cerebellar: Unable to perform    Lab Results: Basic Metabolic Panel:  Recent Labs Lab 06/08/17 0502 06/09/17 0418 06/11/17 0536 06/12/17 1009 06/13/17 0230 06/14/17 0500  NA 138 137 138 136 136 140  K 3.5 4.0 2.6* 2.6* 3.0* 3.5  CL 115* 112* 109 106 107 110  CO2 18* 19* _1 GLUCOSE 132* 110* 138* 153* 146* 131*  BUN 9 5* 13 17 22* 23*  CREATININE 0.64 0.72 0.61 0.77 0.84 0.73  CALCIUM 8.4* 8.3* 8.1* 8.1* 8.1* 8.5*  MG 1.3* 1.8 1.4* 1.5* 2.0  --   PHOS 3.0 3.1 2.5  --  2.1*  --     Liver Function Tests:  Recent Labs Lab 06/12/17 1009 06/13/17 0230 06/13/17 2150  AST 66* 57*  --   ALT 21 20  --   ALKPHOS 61 65  --  BILITOT 0.7 0.8  --   PROT 5.0* 5.3*  --   ALBUMIN 1.5* 1.6* 1.6*   No results for input(s): LIPASE, AMYLASE in the last 168 hours.  Recent Labs Lab 06/12/17 1009 06/13/17 0230 06/14/17 0521  AMMONIA 71* 89* 70*    CBC:  Recent Labs Lab 06/09/17 0418 06/11/17 0536 06/11/17 1330 06/12/17 1009 06/13/17 0230  WBC 10.6* 10.8* 12.1* 9.4 10.4  NEUTROABS  --   --   --  6.8  --   HGB 12.2* 9.8* 9.4* 8.9* 8.4*  HCT 35.9* 29.0* 27.5* 26.3* 24.5*  MCV 89.5 89.2 88.7 88.3 88.1  PLT 213 136* 135* 126* 112*    Cardiac Enzymes: No results for input(s): CKTOTAL, CKMB, CKMBINDEX, TROPONINI in the last 168 hours.  Lipid Panel: No results for input(s): CHOL, TRIG, HDL, CHOLHDL, VLDL, LDLCALC in the last 168 hours.  CBG:  Recent Labs Lab 06/13/17 1147 06/13/17 1543 06/13/17 2120 06/13/17 2345 06/14/17 0325  GLUCAP 141* 131* 146* 128* 127*    Microbiology: Results for orders placed or performed during the hospital encounter of 05/28/17  MRSA PCR Screening     Status: None   Collection Time: 05/29/17  5:21 AM  Result Value Ref Range Status   MRSA by PCR NEGATIVE NEGATIVE Final    Comment:        The GeneXpert MRSA Assay (FDA approved for NASAL specimens only), is one component of  a comprehensive MRSA colonization surveillance program. It is not intended to diagnose MRSA infection nor to guide or monitor treatment for MRSA infections.   Culture, respiratory (NON-Expectorated)     Status: None   Collection Time: 06/01/17  9:50 AM  Result Value Ref Range Status   Specimen Description ENDOTRACHEAL  Final   Special Requests NONE  Final   Gram Stain   Final    MODERATE WBC PRESENT,BOTH PMN AND MONONUCLEAR FEW GRAM POSITIVE COCCI IN PAIRS RARE GRAM NEGATIVE RODS    Culture Consistent with normal respiratory flora.  Final   Report Status 06/03/2017 FINAL  Final    Coagulation Studies: No results for input(s): LABPROT, INR in the last 72 hours.  Imaging: Dg Chest Port 1 View  Result Date: 06/14/2017 CLINICAL DATA:  Follow-up pleural effusion. History of cirrhosis, chronic CHF, aspiration, former smoker. EXAM: PORTABLE CHEST 1 VIEW COMPARISON:  Portable chest x-ray of June 12, 2017 FINDINGS: The tracheostomy tube tip projects at the inferior margin of the clavicular heads. The lungs are reasonably well inflated. There are bibasilar densities consistent with posterior layering pleural effusions. The cardiac silhouette is enlarged. The central pulmonary vascularity is mildly prominent. The esophagogastric tube tip likely projects below the inferior margin of the image but the inferior aspect is not included in the field of view. The right internal jugular venous catheter tip projects over the distal third of the SVC. IMPRESSION: Bilateral pleural effusions layering posteriorly. Bibasilar atelectasis or pneumonia is not excluded. Low-grade CHF. The support tubes are in reasonable position though the tip of the esophagogastric tube cannot be definitely discerned. Electronically Signed   By: Trent Theisen  Martinique M.D.   On: 06/14/2017 07:22   Dg Chest Port 1 View  Result Date: 06/12/2017 CLINICAL DATA:  Pleural effusion. EXAM: PORTABLE CHEST 1 VIEW COMPARISON:  Radiograph of  June 09, 2017. FINDINGS: Mild cardiomegaly is noted. Tracheostomy tube is in grossly good position. Nasogastric tube is seen entering the stomach. Right internal jugular catheter is noted with distal tip in expected position of the  SVC. No pneumothorax is noted. Stable bibasilar atelectasis or edema is noted with associated pleural effusions. Bony thorax is unremarkable. IMPRESSION: Stable support apparatus. Stable bibasilar opacities as described above. Electronically Signed   By: Marijo Conception, M.D.   On: 06/12/2017 15:03    Medications:  Scheduled: . chlorhexidine gluconate (MEDLINE KIT)  15 mL Mouth Rinse BID  . Chlorhexidine Gluconate Cloth  6 each Topical Daily  . feeding supplement (PRO-STAT SUGAR FREE 64)  30 mL Per Tube BID  . folic acid  1 mg Intravenous Daily  . lactulose  30 g Oral TID  . levothyroxine  25 mcg Intravenous Daily  . mouth rinse  15 mL Mouth Rinse QID  . metoprolol tartrate  25 mg Per NG tube TID  . pantoprazole (PROTONIX) IV  40 mg Intravenous Q24H  . potassium chloride  40 mEq Per NG tube Once  . pregabalin  75 mg Per Tube BID  . sodium chloride flush  10-40 mL Intracatheter Q12H  . sulfamethoxazole-trimethoprim  1 tablet Oral Q12H  . thiamine injection  100 mg Intravenous Daily    Assessment/Plan:  76 year old man, history of old stroke, presented with partial status epilepticus. He was in burst suppression for over 3 days and required multiple antiepileptics to control his partial seizures. Since coming off of sedation, his exam is remained poor and he continues to remain unresponsive. He did have deranged hepatic function and might have been taking longer to metabolize the sedating medications than usual. His phenytoin levels unit a reduced dose continue to be supratherapeutic. We have backed down on his antiepileptics but the exam is remained unchanged. Placed Phenytoin levvel this AM to see where he is.  Awaiting level.   Routine EEG today  MRI  brain will be done when MRI is able to handle ventilator as right now the machine is broken.   Recommendations MRI of the brain has been ordered since 06/11/2017 but there are some technical difficulties and scanning patients on ventilator and hence this has not happened now. Would like to get the MRI as soon as possible. Continue Vimpat at the reduced dose of 50 mg twice a day. Reduce Keppra to 1 g twice a day.  Dilantin per pharmacy  EEG and MRI  Palliative following  Etta Quill PA-C [facility] [phone]  06/14/2017, 9:26 AM

## 2017-06-14 NOTE — Progress Notes (Signed)
PROGRESS NOTE    GEMAYEL MASCIO  HUD:[id number] DOB: 1940/10/22 DOA: 05/28/2017 PCP: Thressa Sheller, MD    Brief Narrative:  76 year old male presented with tremors from the skilled nursing facility. Patient is known to have cerebrovascular accident with left hemiparesis, recently diagnosed with partial seizures, complicated with status epilepticus. He has history of alcohol abuse, hep C, cirrhosis, and heroin abuse. His tremors were mainly on the left arm and left foot, persistent. On initial physical examination blood pressure 146/89, heart rate 96, respirations 16, oxygen saturation 100%, he had moist mucous membranes, his lungs were clear to auscultation bilaterally, no wheezing, rales or rhonchi, heart S1-S2 present and rhythmic, abdomen was soft nontender, no lower extremity edema, he was noted to have left-sided weakness and positive tremor. Patient was admitted to the hospital with working diagnosis of uncontrolled seizures/ partial status epilepticus. August 27 he developed worsening status epilepticus, required intubation for airway protection. He was placed on burst suppression for 3 days. Patient has not regained consciousness despite modification of antiseizure medications. Unable to be liberated from mechanical ventilation, he underwent tracheostomy 06/09/17. Patient has deemed to be poor prognosis, pending follow-up MRI and EEG.   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 Status epilepticus. Unchanged in level of consciousness, patient continue to be non responsive, positive corneal  and oculocephalic reflexes. Plan for MRI and EEG today,  follow on neurology recommendations. Continue thiamine, pregabalin, and folic acid. Continue with lacosamide and levetiracetam, plus IV phenytoin. Noted phenytoin level at 7,7.   2. Hepatic portosystemic encephalopathy, in the setting of cirrhosis (alcohol/hepatitis C). This am ammonia down to 70 from 89, will continue lactulose therapy per NG, patient continue to be unresponsive.   3. Respiratory failure due to CNS dysfunction. Patient unable to be liberated from mechanical ventilation, sp tracheostomy. Patient has failed spontaneous breathing trials with pressure support ventilation, due to tachypnea and tachycardia. Currently on PRVC, tV600, Peep 5, RR 12 and Fi02 30%. Will continue PSV weaning trials daily.   4. Atrial fibrillation with rapid ventricular response. Uncontrolled heart rate, will continue amiodarone drip and will add metoprolol 25 mg per tube tid, continue telemetry monitoring, will need anticoagulation when more stable, will resume aspirin for now.    4. Nonischemic cardiomyopathy. No signs of volume overload, edema likely due to hypoproteinemia and reduced oncotic pressure, will continue to hold on diuresis, continue rate control with amiodarone and will add metoprolol. Echocardiography with left ventricle ejection fraction at 45 to 50%, with diffuse hypokinesis.   5. History of CVA. Continue supportive medical care, will add aspirin, continue neuro checks, for MRI today, aspiration precautions.    6. Thrombocytopenia. [place] reactive, check cell count in am. No signs of bleeding.  7. NEW Neck cellulitis. Trach site with erythema, edema and purulence, started on Bactrim, will continue close follow up to clinical response, may need soft tissue US with worsening to rule out deep infection, abscess.   8. Hypothyroid. On levothyroxine  9. Hypokalemia. K up to 3,5, will continue k repletion with kcl, follow renal panel in am, avoid hypotension or nephrotoxic medications, renal  function with cr at 0.73. Serum  bicarbonate at 24.     DVT prophylaxis: enoxaparin  Code Status: Full Family Communication:  Disposition Plan: to be determined   Consultants:   Neurology  Critical  Care  Procedures:   Tracheostomy 09/05  Antimicrobials:    Bactrim     Subjective: Patient continue to be not responsive, tolerating well tube feedings, uncontrolled atrial fibrillation. Limited history due to patient's mental status.   Objective: Vitals:   06/14/17 0500 06/14/17 0600 06/14/17 0731 06/14/17 0751  BP: 108/72 108/69 112/69 124/80  Pulse: (!) 127 (!) 126 (!) 126 (!) 130  Resp: _0 Temp:    98 F (36.7 C)  TempSrc:    Oral  SpO2: 99% 99% 100% 100%  Weight:      Height:        Intake/Output Summary (Last 24 hours) at 06/14/17 0910 Last data filed at 06/14/17 0801  Gross per 24 hour  Intake          1966.41 ml  Output             1425 ml  Net           541.41 ml   Filed Weights   06/12/17 0508 06/13/17 0328 06/14/17 0244  Weight: 91.3 kg (201 lb 4.5 oz) 91.4 kg (201 lb 8 oz) 92 kg (202 lb 13.2 oz)    Examination:  General: deconditioned Neurology: not responsive to pain or touch. Positive oculocephalic and corneal reflexes.  E ENT: positieve pallor, no icterus, oral mucosa dry Cardiovascular: S1-S2 present, irregulary irregular, tachycardic, no gallops, rubs, or murmurs. No jugular venous distention, positive pitting lower extremity edema. Pulmonary: vesicular breath sounds bilaterally, adequate air movement, no wheezing, rhonchi or rales. Gastrointestinal. Abdomen mild distention, no organomegaly, non tender, no rebound or guarding Skin. No rashes. Positive edema, pitting generalized +/++.  Musculoskeletal: no joint deformities     Data Reviewed: I have personally reviewed following labs and imaging studies  CBC:  Recent Labs Lab 06/09/17 0418 06/11/17 0536 06/11/17 1330 06/12/17 1009 06/13/17 0230  WBC 10.6* 10.8*  12.1* 9.4 10.4  NEUTROABS  --   --   --  6.8  --   HGB 12.2* 9.8* 9.4* 8.9* 8.4*  HCT 35.9* 29.0* 27.5* 26.3* 24.5*  MCV 89.5 89.2 88.7 88.3 88.1  PLT 213 136* 135* 126* 888*   Basic Metabolic Panel:  Recent Labs Lab 06/08/17 0502 06/09/17 0418 06/11/17 0536 06/12/17 1009 06/13/17 0230 06/14/17 0500  NA 138 137 138 136 136 140  K 3.5 4.0 2.6* 2.6* 3.0* 3.5  CL 115* 112* 109 106 107 110  CO2 18* 19* _1 GLUCOSE 132* 110* 138* 153* 146* 131*  BUN 9 5* 13 17 22* 23*  CREATININE 0.64 0.72 0.61 0.77 0.84 0.73  CALCIUM 8.4* 8.3* 8.1* 8.1* 8.1* 8.5*  MG 1.3* 1.8 1.4* 1.5* 2.0  --   PHOS 3.0 3.1 2.5  --  2.1*  --    GFR: Estimated Creatinine Clearance: 91.1 mL/min (by C-G formula based on SCr of 0.73 mg/dL). Liver Function Tests:  Recent Labs Lab 06/12/17 1009 06/13/17 0230 06/13/17 2150  AST 66* 57*  --   ALT 21 20  --   ALKPHOS 61 65  --   BILITOT 0.7 0.8  --   PROT 5.0* 5.3*  --   ALBUMIN 1.5* 1.6* 1.6*   No results for input(s): LIPASE, AMYLASE in the last 168 hours.  Recent Labs Lab 06/11/17 [phone]  06/12/17 1009 06/13/17 0230 06/14/17 0521  AMMONIA 70* 71* 89* 70*   Coagulation Profile:  Recent Labs Lab 06/09/17 0418  INR 1.21   Cardiac Enzymes: No results for input(s): CKTOTAL, CKMB, CKMBINDEX, TROPONINI in the last 168 hours. BNP (last 3 results) No results for input(s): PROBNP in the last 8760 hours. HbA1C: No results for input(s): HGBA1C in the last 72 hours. CBG:  Recent Labs Lab 06/13/17 1147 06/13/17 1543 06/13/17 2120 06/13/17 2345 06/14/17 0325  GLUCAP 141* 131* 146* 128* 127*   Lipid Profile: No results for input(s): CHOL, HDL, LDLCALC, TRIG, CHOLHDL, LDLDIRECT in the last 72 hours. Thyroid Function Tests: No results for input(s): TSH, T4TOTAL, FREET4, T3FREE, THYROIDAB in the last 72 hours. Anemia Panel: No results for input(s): VITAMINB12, FOLATE, FERRITIN, TIBC, IRON, RETICCTPCT in the last 72 hours.    Radiology  Studies: I have reviewed all of the imaging during this hospital visit personally     Scheduled Meds: . chlorhexidine gluconate (MEDLINE KIT)  15 mL Mouth Rinse BID  . Chlorhexidine Gluconate Cloth  6 each Topical Daily  . feeding supplement (PRO-STAT SUGAR FREE 64)  30 mL Per Tube BID  . folic acid  1 mg Intravenous Daily  . lactulose  30 g Oral TID  . levothyroxine  25 mcg Intravenous Daily  . mouth rinse  15 mL Mouth Rinse QID  . pantoprazole (PROTONIX) IV  40 mg Intravenous Q24H  . pregabalin  75 mg Per Tube BID  . sodium chloride flush  10-40 mL Intracatheter Q12H  . sulfamethoxazole-trimethoprim  1 tablet Oral Q12H  . thiamine injection  100 mg Intravenous Daily   Continuous Infusions: . amiodarone 30 mg/hr (06/14/17 0801)  . dextrose 5 % and 0.45% NaCl 10 mL/hr (06/11/17 1423)  . feeding supplement (VITAL AF 1.2 CAL) 1,000 mL (06/14/17 0345)  . lacosamide (VIMPAT) IV Stopped (06/13/17 2220)  . levETIRAcetam Stopped (06/13/17 2155)     LOS: 16 days        Mahealani Sulak Gerome Apley, MD [facility] Pager [phone]

## 2017-06-14 NOTE — Progress Notes (Signed)
RT Note: Patient transported to and from MRI on vent, patient vitals stable throughout and currently, no apparent complications, patient is resting comfortably, RT will continue to monitor.

## 2017-06-14 NOTE — Progress Notes (Signed)
EEG completed, results pending. 

## 2017-06-14 NOTE — Progress Notes (Signed)
Trach care done per RRT. New IC inserted and locked back into place no complications noted. Pt is stable at this time.

## 2017-06-14 NOTE — Progress Notes (Signed)
MEDICATION RELATED CONSULT NOTE - INITIAL   Pharmacy Consult for Phenytoin Indication: Seizures  Allergies  Allergen Reactions  . Nsaids Other (See Comments)    Acute gastric ulcers  . Oxycodone Other (See Comments)    Per the risk calculating tool RIOSORD: (Risk Index for Overdose or Serious Opioid -induced Respiratory Depression Risk ) calculated risk in ensuing 6 months  = 83% ( see Problem List for discussion)    Patient Measurements: Height: 5\' 11"  (180.3 cm) Weight: 202 lb 13.2 oz (92 kg) IBW/kg (Calculated) : 75.3   Vital Signs: Temp: 98 F (36.7 C) (09/10 0751) Temp Source: Oral (09/10 0751) BP: 113/74 (09/10 0936) Pulse Rate: 120 (09/10 0936) Intake/Output from previous day: 09/09 0701 - 09/10 0700 In: 1966.4 [I.V.:607.1; NG/GT:1189.3; IV Piggyback:170] Out: 1175 [Urine:1175] Intake/Output from this shift: Total I/O In: 10 [I.V.:10] Out: 250 [Urine:250]  Labs:  Recent Labs  06/11/17 1330 06/12/17 1009 06/13/17 0230 06/13/17 2150 06/14/17 0500  WBC 12.1* 9.4 10.4  --   --   HGB 9.4* 8.9* 8.4*  --   --   HCT 27.5* 26.3* 24.5*  --   --   PLT 135* 126* 112*  --   --   CREATININE  --  0.77 0.84  --  0.73  MG  --  1.5* 2.0  --   --   PHOS  --   --  2.1*  --   --   ALBUMIN  --  1.5* 1.6* 1.6*  --   PROT  --  5.0* 5.3*  --   --   AST  --  66* 57*  --   --   ALT  --  21 20  --   --   ALKPHOS  --  61 65  --   --   BILITOT  --  0.7 0.8  --   --    Estimated Creatinine Clearance: 91.1 mL/min (by C-G formula based on SCr of 0.73 mg/dL).  Medical History: Past Medical History:  Diagnosis Date  . Abnormal TSH   . Acute gastric ulcer   . Acute gastritis with hemorrhage   . Alcohol abuse   . Alcohol dependence ([place]) 02/02/2014  . Anemia   . Bifascicular block   . Cirrhosis ([place]) 06/04/2012  . Depressive disorder 02/01/2014  . Dizziness and giddiness 12/13/2014  . DVT of lower limb, acute ([place]) 06/06/2012  . DVT, lower extremity, recurrent ([place])    a. noted  2013. b. also dx 06/2016.  Marland Kitchen Essential hypertension   . Fatty liver   . Gallstones   . Gastritis 06/07/2012  . GERD (gastroesophageal reflux disease)   . GI bleed due to NSAIDs 12/13/2014  . Granulomatous gastritis   . Hematuria 06/04/2012  . Hepatitis C   . Hepatocellular carcinoma ([place])   . Heroin abuse    "I haven't done that since I don't know when."  . Heroin overdose 02/20/2014  . Neuropathy   . NICM (nonischemic cardiomyopathy) ([place])    a. 04/2015: EF 45-50% by cath. b. EF 40-45% by echo 06/2016.  . NSTEMI (non-ST elevated myocardial infarction) ([place])    a. 04/2015 - patent coronaries. Etiology possibly due to coronary spasm versus embolus, stress cardiomyopathy (atypical), and aborted infarction related to plaque rupture with thrombosis and dissolution. Amlodipine started. Not on antiplatelets due to GIB/cirrhosis history.  . Oral thrush 06/05/2012  . Polysubstance abuse    THC, alcohol, heroin  . Prolonged Q-T interval on ECG  a. 12/2014 - treated with magnesium.  . Right knee pain 12/13/2014  . S/P alcohol detoxification 02/02/2014  . Stroke ([place]) 06/2016  . SVT (supraventricular tachycardia) ([place])    a. 12/2014 in setting of GIB, ETOH, NSAIDS, gastritis.  . Symptomatic cholelithiasis 12/15/2013  . Thrombocytopenia ([place])   . Upper GI bleeding 12/13/2014  . Weight loss 06/04/2012   Assessment: 75yom started on phenytoin 100mg  IV q8 for seizures on 8/28. Level drawn on 9/7 was supratherapeutic and dose reduced to 100mg  IV q12. Doses then held 9/9 due to patient being unresponsive (last dose 9/9 @ 0938). Pharmacy has now been asked to resume phenytoin when appropriate. Today's level of 7.7 is corrected to ~18 using albumin 1.6. Level is therapeutic. Renal function stable.   Goal of Therapy:  Total phenytoin level 15-20  Plan:  1) Resume phenytoin 100mg  IV q12 w/ first dose this evening 2) Check another level at steady state  Deboraha Sprang 06/14/2017,10:56 AM

## 2017-06-15 DIAGNOSIS — I480 Paroxysmal atrial fibrillation: Secondary | ICD-10-CM

## 2017-06-15 DIAGNOSIS — L03221 Cellulitis of neck: Secondary | ICD-10-CM

## 2017-06-15 LAB — GLUCOSE, CAPILLARY
GLUCOSE-CAPILLARY: 124 mg/dL — AB (ref 65–99)
GLUCOSE-CAPILLARY: 139 mg/dL — AB (ref 65–99)
Glucose-Capillary: 124 mg/dL — ABNORMAL HIGH (ref 65–99)
Glucose-Capillary: 130 mg/dL — ABNORMAL HIGH (ref 65–99)
Glucose-Capillary: 118 mg/dL — ABNORMAL HIGH (ref 65–99)
Glucose-Capillary: 109 mg/dL — ABNORMAL HIGH (ref 65–99)

## 2017-06-15 LAB — BASIC METABOLIC PANEL
Anion gap: 6 (ref 5–15)
BUN: 27 mg/dL — AB (ref 6–20)
CALCIUM: 8.6 mg/dL — AB (ref 8.9–10.3)
CHLORIDE: 112 mmol/L — AB (ref 101–111)
CO2: 22 mmol/L (ref 22–32)
CREATININE: 0.79 mg/dL (ref 0.61–1.24)
GFR calc non Af Amer: >60 mL/min (ref >60)
Glucose, Bld: 128 mg/dL — ABNORMAL HIGH (ref 65–99)
Potassium: 2.8 mmol/L — ABNORMAL LOW (ref 3.5–5.1)
SODIUM: 140 mmol/L (ref 135–145)

## 2017-06-15 LAB — PHENYTOIN LEVEL, FREE AND TOTAL
PHENYTOIN FREE: 2.1 ug/mL — AB (ref 1.0–2.0)
Phenytoin, Total: 8.6 ug/mL — ABNORMAL LOW (ref 10.0–20.0)

## 2017-06-15 LAB — AMMONIA: Ammonia: 58 umol/L — ABNORMAL HIGH (ref 9–35)

## 2017-06-15 LAB — MAGNESIUM: MAGNESIUM: 1.7 mg/dL (ref 1.7–2.4)

## 2017-06-15 MED ORDER — VITAMIN B-1 100 MG PO TABS
100.0000 mg | ORAL_TABLET | Freq: Every day | ORAL | Status: DC
  Administered 2017-06-16 – 2017-06-24 (×9): 100 mg
  Filled 2017-06-15 (×9): qty 1

## 2017-06-15 MED ORDER — PANTOPRAZOLE SODIUM 40 MG PO PACK
40.0000 mg | PACK | Freq: Every day | ORAL | Status: DC
  Administered 2017-06-16 – 2017-06-24 (×9): 40 mg
  Filled 2017-06-15 (×9): qty 20

## 2017-06-15 MED ORDER — LEVOTHYROXINE SODIUM 50 MCG PO TABS
50.0000 ug | ORAL_TABLET | Freq: Every day | ORAL | Status: DC
  Administered 2017-06-16 – 2017-06-24 (×9): 50 ug
  Filled 2017-06-15 (×9): qty 1

## 2017-06-15 MED ORDER — SCOPOLAMINE 1 MG/3DAYS TD PT72
1.0000 | MEDICATED_PATCH | TRANSDERMAL | Status: DC
  Administered 2017-06-15: 1.5 mg via TRANSDERMAL
  Filled 2017-06-15: qty 1

## 2017-06-15 MED ORDER — POTASSIUM CHLORIDE 10 MEQ/100ML IV SOLN
10.0000 meq | INTRAVENOUS | Status: AC
  Administered 2017-06-15 (×3): 10 meq via INTRAVENOUS
  Filled 2017-06-15 (×3): qty 100

## 2017-06-15 MED ORDER — POTASSIUM CHLORIDE 20 MEQ/15ML (10%) PO SOLN
40.0000 meq | Freq: Three times a day (TID) | ORAL | Status: AC
  Administered 2017-06-15 (×3): 40 meq
  Filled 2017-06-15 (×3): qty 30

## 2017-06-15 MED ORDER — MAGNESIUM SULFATE 2 GM/50ML IV SOLN
2.0000 g | Freq: Once | INTRAVENOUS | Status: AC
  Administered 2017-06-15: 2 g via INTRAVENOUS
  Filled 2017-06-15: qty 50

## 2017-06-15 MED ORDER — FOLIC ACID 1 MG PO TABS
1.0000 mg | ORAL_TABLET | Freq: Every day | ORAL | Status: DC
  Administered 2017-06-15 – 2017-06-24 (×10): 1 mg
  Filled 2017-06-15 (×10): qty 1

## 2017-06-15 MED ORDER — VITAL AF 1.2 CAL PO LIQD
1000.0000 mL | ORAL | Status: DC
  Administered 2017-06-15 – 2017-06-23 (×10): 1000 mL
  Filled 2017-06-15 (×11): qty 1000

## 2017-06-15 MED ORDER — FUROSEMIDE 10 MG/ML IJ SOLN
40.0000 mg | Freq: Three times a day (TID) | INTRAMUSCULAR | Status: AC
  Administered 2017-06-15 – 2017-06-16 (×3): 40 mg via INTRAVENOUS
  Filled 2017-06-15 (×3): qty 4

## 2017-06-15 MED ORDER — ALBUMIN HUMAN 25 % IV SOLN
12.5000 g | Freq: Three times a day (TID) | INTRAVENOUS | Status: AC
  Administered 2017-06-15 – 2017-06-16 (×3): 12.5 g via INTRAVENOUS
  Filled 2017-06-15 (×3): qty 50

## 2017-06-15 MED ORDER — SULFAMETHOXAZOLE-TRIMETHOPRIM 800-160 MG PO TABS
1.0000 | ORAL_TABLET | Freq: Two times a day (BID) | ORAL | Status: DC
  Administered 2017-06-15 – 2017-06-21 (×12): 1
  Filled 2017-06-15 (×12): qty 1

## 2017-06-15 NOTE — Progress Notes (Addendum)
Nutrition Follow-up  DOCUMENTATION CODES:   Non-severe (moderate) malnutrition in context of chronic illness  INTERVENTION:    D/C Prostat    Increase Vital AF 1.2 formula to goal rate of 70 ml/hr  TF regimen to provide 2016 kcals, 126 gm protein, 1362 ml of free water daily  NUTRITION DIAGNOSIS:   Malnutrition (Moderate) related to chronic illness (cirrhosis) as evidenced by mild depletion of body fat, mild depletion of muscle mass, ongoing  GOAL:   Patient will meet greater than or equal to 90% of their needs, met  MONITOR:   TF tolerance, Labs, Weight trends, Skin, I & O's  ASSESSMENT:   Pt from SNF with PMH of substance abuse, alcohol cirrhosis, hepatic encephalopathy, CVA, residual L sided hemiparesis, recent admit for new onset sz. Pt admitted with seizures.   9/5 trach placed  Patient is currently on ventilator support MV: 7.2 L/min Temp (24hrs), Avg:98.7 F (37.1 C), Min:96.4 F (35.8 C), Max:100.1 F (37.8 C)  Pt with seizures, embolic CVA and cirrhosis. Current TF regimen: Vital AF 1.2 at 55 ml/hr with 30 ml PS BID via NGT. Palliative Medicine Team following. Family would like PEG tube placed once indicated.  Medications reviewed and include folvite and Vitamin B 12. Labs reviewed. K 2.8 (L). Cl 112 (H). CBG's [phone].  Diet Order:  Diet NPO time specified  Skin:  Reviewed, no issues  Last BM:  9/11   Intake/Output Summary (Last 24 hours) at 06/15/17 1242 Last data filed at 06/15/17 0910  Gross per 24 hour  Intake          2496.25 ml  Output             1950 ml  Net           546.25 ml   Height:   Ht Readings from Last 1 Encounters:  06/11/17 5' 11"  (1.803 m)   Weight:   Wt Readings from Last 1 Encounters:  06/15/17 202 lb 9.6 oz (91.9 kg)   Ideal Body Weight:  78.1 kg  BMI:  Body mass index is 28.26 kg/m.  Re-estimated Nutritional Needs:   Kcal:  1938  Protein:  115-130 gm  Fluid:  >/= 1.9 L  EDUCATION NEEDS:   No  education needs identified at this time  Arthur Holms, RD, LDN Pager #: [phone] After-Hours Pager #: [phone]

## 2017-06-15 NOTE — Progress Notes (Signed)
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  Intubated for airway.  Failed weaning and required trach.  Hx of ETOH and Hep C with cirrhosis, heroin abuse, hepatic encephalopathy, GIB, thrombocytopenia, non ischemic CM.  Subjective: On PSV   Vital signs: BP 114/65 (BP Location: Left Arm)   Pulse 96   Temp 98.5 F (36.9 C) (Axillary)   Resp (!) 31   Ht 5\' 11"  (1.803 m)   Wt 202 lb 9.6 oz (91.9 kg)   SpO2 100%   BMI 28.26 kg/m   Intake/output:  Intake/Output Summary (Last 24 hours) at 06/15/17 1137 Last data filed at 06/15/17 0910  Gross per 24 hour  Intake          2496.25 ml  Output             1950 ml  Net           546.25 ml     General appearance:  Chronically ill appearing 76 Year old  Malecachectic  NAD, weaning on higher levels of PSV today  Eyes: anicteric sclerae, moist conjunctivae; PERRL, EOMI bilaterally. Mouth: moist membranes and no mucosal ulcerations; normal hard and soft palate Neck: Trachea midline; neck supple, no JVD, the trach site remains erythremic. I cannot palpate any raised area but can still express some purulent fluid at the superior trach stoma. Overall looks a little better Lungs/chest: CTA, with normal respiratory effort and no intercostal retractions, good vt on high levels of PSV CV: RRR, no MRGs  Abdomen: Soft, non-tender; no masses or HSM Extremities: No peripheral edema or extremity lymphadenopathy Skin: Normal temperature, turgor and texture; no rash, ulcers or subcutaneous nodules Psych: not interactive    CMP Latest Ref Rng & Units 06/15/2017 06/14/2017 06/13/2017  Glucose 65 - 99 mg/dL 128(H) 131(H) 146(H)  BUN 6 - 20 mg/dL 27(H) 23(H) 22(H)  Creatinine 0.61 - 1.24 mg/dL 0.79 0.73 0.84  Sodium 135 - 145 mmol/L 140 140 136  Potassium 3.5 - 5.1 mmol/L 2.8(L) 3.5 3.0(L)  Chloride 101 - 111 mmol/L 112(H) 110 107  CO2 22 - 32  mmol/L 22 24 24   Calcium 8.9 - 10.3 mg/dL 8.6(L) 8.5(L) 8.1(L)  Total Protein 6.5 - 8.1 g/dL - - 5.3(L)  Total Bilirubin 0.3 - 1.2 mg/dL - - 0.8  Alkaline Phos 38 - 126 U/L - - 65  AST 15 - 41 U/L - - 57(H)  ALT 17 - 63 U/L - - 20    CBC Latest Ref Rng & Units 06/13/2017 06/12/2017 06/11/2017  WBC 4.0 - 10.5 K/uL 10.4 9.4 12.1(H)  Hemoglobin 13.0 - 17.0 g/dL 8.4(L) 8.9(L) 9.4(L)  Hematocrit 39.0 - 52.0 % 24.5(L) 26.3(L) 27.5(L)  Platelets 150 - 400 K/uL 112(L) 126(L) 135(L)    ABG    Component Value Date/Time   PHART 7.397 06/09/2017 0342   PCO2ART 27.2 (L) 06/09/2017 0342   PO2ART 71.0 (L) 06/09/2017 0342   HCO3 16.9 (L) 06/09/2017 0342   TCO2 18 (L) 06/09/2017 0342   ACIDBASEDEF 7.0 (H) 06/09/2017 0342   O2SAT 95.0 06/09/2017 0342    CBG (last 3)   Recent Labs  06/15/17 0010 06/15/17 0358 06/15/17 0747  GLUCAP 139* 124* 124*     Mr Brain Wo Contrast  Result Date: 06/14/2017 CLINICAL DATA:  Initial evaluation for acute altered mental status, history of seizure. EXAM: MRI HEAD WITHOUT CONTRAST TECHNIQUE: Multiplanar,  multiecho pulse sequences of the brain and surrounding structures were obtained without intravenous contrast. COMPARISON:  Prior MRI from 05/29/2017. FINDINGS: Brain: Moderately advanced cerebral atrophy. Underline confluent and patchy/ FLAIR abnormality within the periventricular and deep white matter, most consistent with chronic microvascular ischemic disease. Multifocal areas of encephalomalacia and gliosis involving the anterior right frontal lobe, left parietal lobe, and throughout the right temporoparietal region consistent with remote ischemic infarcts. Extensive laminar necrosis throughout the remote right temporoparietal infarct. Superimposed remote lacunar infarcts within the right basal ganglia. There is subtly increased gyriform diffusion abnormality within the mesial right temporal lobe, with involvement of the right hippocampus (Series 4, image 15),  extension into the parasagittal right frontoparietal region (series 3, image 31), as well as about the right parietal encephalomalacia (series 3, image 34). Findings consistent with changes related to underlying seizure, and are similar in distribution relative to previous MRI, although are much less evident/prominent as compared to previous exam. No new intracranial hemorrhage or infarct. No midline shift, mass effect, or mass lesion. Stable ventricular prominence without hydrocephalus. No extra-axial fluid collection. Vascular: Major intracranial vascular flow voids maintained. Skull and upper cervical spine: Craniocervical junction within normal limits. Visualized upper cervical spine unremarkable. Bone marrow signal intensity normal. No scalp soft tissue abnormality. Sinuses/Orbits: Globes and orbital soft tissues within normal limits. Scattered mucosal thickening within the ethmoidal air cells and maxillary sinuses, with superimposed fluid level in the left maxillary sinus. Fluid seen layering within the posterior nasopharynx. Nasogastric tube in place. Bilateral mastoid effusions noted. Other: None. IMPRESSION: 1. Minimal gyriform diffusion abnormality involving the right cerebral hemisphere, greatest at the mesial right temporal lobe, most consistent with changes related to seizure. Changes are stable in distribution as compared to those seen on recent MRI, although are much less prominent as compared to previous exam. Findings may reflect the residua of previously seen seizure and/or new but more mild seizure activity. 2. No other acute intracranial abnormality. Underlying chronic right greater than left hemispheric encephalomalacia. Electronically Signed   By: Jeannine Boga M.D.   On: 06/14/2017 21:05   Dg Chest Port 1 View  Result Date: 06/14/2017 CLINICAL DATA:  Follow-up pleural effusion. History of cirrhosis, chronic CHF, aspiration, former smoker. EXAM: PORTABLE CHEST 1 VIEW COMPARISON:   Portable chest x-ray of June 12, 2017 FINDINGS: The tracheostomy tube tip projects at the inferior margin of the clavicular heads. The lungs are reasonably well inflated. There are bibasilar densities consistent with posterior layering pleural effusions. The cardiac silhouette is enlarged. The central pulmonary vascularity is mildly prominent. The esophagogastric tube tip likely projects below the inferior margin of the image but the inferior aspect is not included in the field of view. The right internal jugular venous catheter tip projects over the distal third of the SVC. IMPRESSION: Bilateral pleural effusions layering posteriorly. Bibasilar atelectasis or pneumonia is not excluded. Low-grade CHF. The support tubes are in reasonable position though the tip of the esophagogastric tube cannot be definitely discerned. Electronically Signed   By: David  Martinique M.D.   On: 06/14/2017 07:22     Lines/tubes: Lurline Idol 9/05 [facility]) >>   Assessment/plan:  Stroke Encephalopathy  Cirrhosis  Hep C H/o CVA afib  Seizures (status) Compromised airway. Failure to wean s/p tracheostomy. 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 Evolving abscess  Pulm problem list  Trach/vent dependent w/ failure to wean in setting of volume overload, encephalopathy, protein calorie malnutrition and severe deconditioning.  -he is NOT a  candidate for decannulation -hope we can get his volume status remains barrier to coming off vent but his mental status will prevent decannulation  Interval hx from 9/6 to 9/10 Seen by palliative; patients family indicated that they want all aggressive therapy done including PEG tube.  Was in burst suppression for > 3 d that required several sedating meds and anti-convulsants. This is likely complicating MS.  Now more awake but not interactive  PCXR personally reviewed: trach good position. Bilateral effusions/atx on 9/10 9/11 weaning. Neck cellulitis/abscess  draining purulent fluid. Less swollen; still erythremic but over all looks better  Plan/rec Cont PSV as tolerated Will repeat his albumin/lasix today Repeat cxr in am  Cont bactrim for neck cellulitis/abscess. Now day # 3. Will add warm compresses.   -all other care per med service  -I am concerned thatn he may not come off vent.   Nathan Frank ACNP-BC [place] Pager # [phone] OR # [phone] if no answer

## 2017-06-15 NOTE — Care Management Important Message (Signed)
Important Message  Patient Details  Name: Nathan Frank MRN: [id number] Date of Birth: [birth year]   Medicare Important Message Given:  Yes    [name] May 06/15/2017, 9:01 AM

## 2017-06-15 NOTE — Consult Note (Signed)
   [facility] CM Inpatient Consult   06/15/2017  PHOENYX PAULSEN [birth year] [id number]  Patient has had several [facility] attempts in the community with difficulty maintaining contact per notes with [place] and Pharmacist. Patient's chart has been reviewed.  Patient is in the [place], with long length of stay and admissions. Patient is 76 year old admitted wit seizures requiring intubation on ventilator, s/p trach on 06/09/17 continues to fail breathing trials. Remains on the vent.  Chart review of palliative care notes was noted. Per chart review prognosis is poor, currently disposition is likely for a vent skilled facility.  No current [facility] community follow up needs noted.  For questions, please contact:  Natividad Brood, RN BSN [facility] Liaison  [phone] business mobile phone Toll free office [phone]

## 2017-06-15 NOTE — Progress Notes (Signed)
Rt noticed drainage from stoma site as well as still being swollen and red from previous days. Full trach care was performed by RT. Site was cleaned, dried, dressing applied as well as protective skin barrier placed. Inner cannula was changed with no apparent complication. Patient is still weaning 15/5 40%. Vitals are stable, patient is resting comfortably, RT will continue to monitor.

## 2017-06-15 NOTE — Plan of Care (Signed)
Problem: Pain Managment: Goal: General experience of comfort will improve Outcome: Progressing Patient shows no signs of pain at this time, VSS

## 2017-06-15 NOTE — Progress Notes (Signed)
PROGRESS NOTE [facility]   Nathan Frank   MPN:[id number] DOB: 10/11/1940  DOA: 05/28/2017 PCP: Thressa Sheller, MD   Brief Narrative:  Nathan Frank is a 76 year old male with past medical history of hepatitis C, alcohol abuse with cirrhosis, alcoholic cardiomyopathy and recently diagnosed with seizures. Presented to the hospital with weakness and left arm tremor he was admitted for suspected seizures to the medical floor on 8/24. Subsequently patient on 8/27 developed status epilepticus and altered mental status for which he required intubation for airway protection and was transferred to the ICU. He was also found to have elevated ammonia levels. Neurology was consulted, burst suppression was started on 8/28 which lasted for over 3 days. At one point patient was hypotensive required, pressor for a short period. Patient has been unable to be weaned off ventilator, tracheostomy was placed on 06/09/2017. Despite all interventions patient remains unresponsive. Given poor prognosis palliative care was consulted. MRI of brain and EEG was repeated consistent with seizures. Patient developed cellulitis of the neck been treated with bactrim   Subjective: Patient remains unresponsive, tolerating tube feeding, good UOP   Assessment & Plan: Status epilepticus - status post burst suppression - unchanged level of consciousness, non responsive Neurology following, AEDS per their recommendation Off sedation Continue Vimpat, Keppra and phenytoin. Phenytoin level noted - 7.7 Pharmacy to adjust and repeat levels as needed Poor prognosis of meaningful recovery.   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 Continue to wean PS  Pulmonary following for vent settings   Alcoholic cardiomyopathy/Afib with RVR - HR stable  EF 45% w severe TR, diffuse hypokinesis  Initially was hypotensive treated with pressor - now weaned off  Remains Amion for intermittent Afib with RVR  On aspirin for anticoagulation for now, eventually will need NOAC although would be concern with thrombocytopenia.    Thrombocytopenia - likely for cirrhosis and acute illness  Check CBC in AM   Hypokalemia/Hypomagnesemia  Continue Kdur via NGT  Will add KCL x 3  Monitor levels in am    Neck cellulitis  Trach site with mild erythema, and some purulence. Patient has been started on Bactrim  Follow up closely if worse will get [place] to r/o abscess  Goals of care  Patient remains full code, eventually will need PEG tube.   DVT prophylaxis: SCD's  Code Status: FULL  Family Communication: None at bedside  Disposition Plan: TBD    Consultants:   PCCM   Neurology   Procedures:   Rt IJ 8/28>>  Trach 9/5   Antimicrobials: Anti-infectives    Start     Dose/Rate Route Frequency Ordered Stop   06/13/17 1000  sulfamethoxazole-trimethoprim (BACTRIM,SEPTRA) 400-80 MG per tablet 1 tablet     1 tablet Oral Every 12 hours 06/13/17 0857        Objective: Vitals:   06/15/17 0500 06/15/17 0600 06/15/17 0735 06/15/17 0751  BP: 100/61 112/76 114/81 114/65  Pulse: (!) 107 (!) 112 (!) 102 98  Resp: 16 (!) 23 (!) 26 (!) 30  Temp:    Marland Kitchen)  96.4 F (35.8 C)  TempSrc:    Axillary  SpO2: 100% 100% 100% 100%  Weight:      Height:        Intake/Output Summary (Last 24 hours) at 06/15/17 0827 Last data filed at 06/15/17 0400  Gross per 24 hour  Intake          2506.25 ml  Output             1200 ml  Net          1306.25 ml   Filed Weights   06/13/17 0328 06/14/17 0244  06/15/17 0319  Weight: 91.4 kg (201 lb 8 oz) 92 kg (202 lb 13.2 oz) 91.9 kg (202 lb 9.6 oz)    Examination:  General: Not responsive, deconditioned  EENT: Trach in place, mild erythema surrounding trach, some purulent material, moist oral mucosa  Cardiovascular: Irr Irr, C5Y8, + systolic murmur  Respiratory: Good air entry, no rales or crackles, on vent  Abdominal: Soft, NT, ND Neuro: Not responsive to verbal, tactile and painful stimuli.  Extremities: generalized pitting edema 3+ Skin: No rash, erythema on neck   Data Reviewed: I have personally reviewed following labs and imaging studies  CBC:  Recent Labs Lab 06/09/17 0418 06/11/17 0536 06/11/17 1330 06/12/17 1009 06/13/17 0230  WBC 10.6* 10.8* 12.1* 9.4 10.4  NEUTROABS  --   --   --  6.8  --   HGB 12.2* 9.8* 9.4* 8.9* 8.4*  HCT 35.9* 29.0* 27.5* 26.3* 24.5*  MCV 89.5 89.2 88.7 88.3 88.1  PLT 213 136* 135* 126* 502*   Basic Metabolic Panel:  Recent Labs Lab 06/09/17 0418 06/11/17 0536 06/12/17 1009 06/13/17 0230 06/14/17 0500 06/15/17 0524  NA 137 138 136 136 140 140  K 4.0 2.6* 2.6* 3.0* 3.5 2.8*  CL 112* 109 106 107 110 112*  CO2 19* _0 GLUCOSE 110* 138* 153* 146* 131* 128*  BUN 5* 13 17 22* 23* 27*  CREATININE 0.72 0.61 0.77 0.84 0.73 0.79  CALCIUM 8.3* 8.1* 8.1* 8.1* 8.5* 8.6*  MG 1.8 1.4* 1.5* 2.0  --  1.7  PHOS 3.1 2.5  --  2.1*  --   --    GFR: Estimated Creatinine Clearance: 91 mL/min (by C-G formula based on SCr of 0.79 mg/dL). Liver Function Tests:  Recent Labs Lab 06/12/17 1009 06/13/17 0230 06/13/17 2150  AST 66* 57*  --   ALT 21 20  --   ALKPHOS 61 65  --   BILITOT 0.7 0.8  --   PROT 5.0* 5.3*  --   ALBUMIN 1.5* 1.6* 1.6*   No results for input(s): LIPASE, AMYLASE in the last 168 hours.  Recent Labs Lab 06/11/17 0536 06/12/17 1009 06/13/17 0230 06/14/17 0521  AMMONIA 70* 71* 89* 70*   Coagulation Profile:  Recent Labs Lab 06/09/17 0418  INR 1.21    Cardiac Enzymes: No results for input(s): CKTOTAL, CKMB, CKMBINDEX, TROPONINI in the last 168 hours. BNP (last 3 results) No results for input(s): PROBNP in the last 8760 hours. HbA1C: No results for input(s): HGBA1C in the last 72 hours. CBG:  Recent Labs Lab 06/14/17 1654 06/14/17 1915 06/15/17 0010 06/15/17 0358 06/15/17 0747  GLUCAP 154* 139* 139* 124* 124*   Lipid Profile: No results for input(s): CHOL, HDL, LDLCALC, TRIG, CHOLHDL, LDLDIRECT in the last 72 hours. Thyroid Function Tests: No results for input(s): TSH, T4TOTAL, FREET4, T3FREE, THYROIDAB in the last 72 hours. Anemia Panel: No  results for input(s): VITAMINB12, FOLATE, FERRITIN, TIBC, IRON, RETICCTPCT in the last 72 hours. Sepsis Labs: No results for input(s): PROCALCITON, LATICACIDVEN in the last 168 hours.  No results found for this or any previous visit (from the past 240 hour(s)).    Radiology Studies: Mr Brain 17 Contrast  Result Date: 06/14/2017 CLINICAL DATA:  Initial evaluation for acute altered mental status, history of seizure. EXAM: MRI HEAD WITHOUT CONTRAST TECHNIQUE: Multiplanar, multiecho pulse sequences of the brain and surrounding structures were obtained without intravenous contrast. COMPARISON:  Prior MRI from 05/29/2017. FINDINGS: Brain: Moderately advanced cerebral atrophy. Underline confluent and patchy/ FLAIR abnormality within the periventricular and deep white matter, most consistent with chronic microvascular ischemic disease. Multifocal areas of encephalomalacia and gliosis involving the anterior right frontal lobe, left parietal lobe, and throughout the right temporoparietal region consistent with remote ischemic infarcts. Extensive laminar necrosis throughout the remote right temporoparietal infarct. Superimposed remote lacunar infarcts within the right basal ganglia. There is subtly increased gyriform diffusion abnormality within the mesial right temporal lobe, with involvement of the  right hippocampus (Series 4, image 15), extension into the parasagittal right frontoparietal region (series 3, image 31), as well as about the right parietal encephalomalacia (series 3, image 34). Findings consistent with changes related to underlying seizure, and are similar in distribution relative to previous MRI, although are much less evident/prominent as compared to previous exam. No new intracranial hemorrhage or infarct. No midline shift, mass effect, or mass lesion. Stable ventricular prominence without hydrocephalus. No extra-axial fluid collection. Vascular: Major intracranial vascular flow voids maintained. Skull and upper cervical spine: Craniocervical junction within normal limits. Visualized upper cervical spine unremarkable. Bone marrow signal intensity normal. No scalp soft tissue abnormality. Sinuses/Orbits: Globes and orbital soft tissues within normal limits. Scattered mucosal thickening within the ethmoidal air cells and maxillary sinuses, with superimposed fluid level in the left maxillary sinus. Fluid seen layering within the posterior nasopharynx. Nasogastric tube in place. Bilateral mastoid effusions noted. Other: None. IMPRESSION: 1. Minimal gyriform diffusion abnormality involving the right cerebral hemisphere, greatest at the mesial right temporal lobe, most consistent with changes related to seizure. Changes are stable in distribution as compared to those seen on recent MRI, although are much less prominent as compared to previous exam. Findings may reflect the residua of previously seen seizure and/or new but more mild seizure activity. 2. No other acute intracranial abnormality. Underlying chronic right greater than left hemispheric encephalomalacia. Electronically Signed   By: Jeannine Boga M.D.   On: 06/14/2017 21:05   Dg Chest Port 1 View  Result Date: 06/14/2017 CLINICAL DATA:  Follow-up pleural effusion. History of cirrhosis, chronic CHF, aspiration, former smoker.  EXAM: PORTABLE CHEST 1 VIEW COMPARISON:  Portable chest x-ray of June 12, 2017 FINDINGS: The tracheostomy tube tip projects at the inferior margin of the clavicular heads. The lungs are reasonably well inflated. There are bibasilar densities consistent with posterior layering pleural effusions. The cardiac silhouette is enlarged. The central pulmonary vascularity is mildly prominent. The esophagogastric tube tip likely projects below the inferior margin of the image but the inferior aspect is not included in the field of view. The right internal jugular venous catheter tip projects over the distal third of the SVC. IMPRESSION: Bilateral pleural effusions layering posteriorly. Bibasilar atelectasis or pneumonia is not excluded. Low-grade CHF. The support tubes are in reasonable position though the tip of the esophagogastric tube cannot be definitely discerned. Electronically Signed   By: David  Martinique M.D.   On: 06/14/2017  07:22      Scheduled Meds: . chlorhexidine gluconate (MEDLINE KIT)  15 mL Mouth Rinse BID  . Chlorhexidine Gluconate Cloth  6 each Topical Daily  . feeding supplement (PRO-STAT SUGAR FREE 64)  30 mL Per Tube BID  . folic acid  1 mg Intravenous Daily  . lactulose  30 g Oral TID  . levothyroxine  25 mcg Intravenous Daily  . mouth rinse  15 mL Mouth Rinse QID  . metoprolol tartrate  25 mg Per NG tube TID  . pantoprazole (PROTONIX) IV  40 mg Intravenous Q24H  . phenytoin (DILANTIN) IV  100 mg Intravenous Q12H  . pregabalin  75 mg Per Tube BID  . sodium chloride flush  10-40 mL Intracatheter Q12H  . sulfamethoxazole-trimethoprim  1 tablet Oral Q12H  . thiamine injection  100 mg Intravenous Daily   Continuous Infusions: . amiodarone 30 mg/hr (06/15/17 0326)  . dextrose 5 % and 0.45% NaCl 10 mL/hr (06/11/17 1423)  . feeding supplement (VITAL AF 1.2 CAL) 1,000 mL (06/15/17 0131)  . lacosamide (VIMPAT) IV Stopped (06/14/17 2113)  . levETIRAcetam 1,000 mg (06/15/17 0810)  .  magnesium sulfate 1 - 4 g bolus IVPB 2 g (06/15/17 0810)  . potassium chloride 10 mEq (06/15/17 0810)     LOS: 17 days    Time spent: Total of 35 minutes spent with pt, greater than 50% of which was spent in discussion of  treatment, counseling and coordination of care    Chipper Oman, MD Pager: Text Page via [email or web address]   If 7PM-7AM, please contact night-coverage [email or web address] 06/15/2017, 8:27 AM

## 2017-06-15 NOTE — Progress Notes (Signed)
PHARMACIST - PHYSICIAN COMMUNICATION  CONCERNING: IV to Oral Route Change Policy  RECOMMENDATION: This patient is receiving Folic acid, Thiamine, Protonix, and Levothyroxine by the intravenous route.  Based on criteria approved by the Pharmacy and Therapeutics Committee, the intravenous medication(s) is/are being converted to the equivalent oral dose form(s).   DESCRIPTION: These criteria include:  The patient is eating (either orally or via tube) and/or has been taking other orally administered medications for a least 24 hours  The patient has no evidence of active gastrointestinal bleeding or impaired GI absorption (gastrectomy, short bowel, patient on TNA or NPO).  If you have questions about this conversion, please contact the Pharmacy Department  []   [phone] )  [facility] []   [phone] )  [facility] [x]   [phone] )  [facility] []   [phone] )  [facility] []   [phone] )  [place], [facility] 06/15/2017 10:54 AM

## 2017-06-16 ENCOUNTER — Inpatient Hospital Stay (HOSPITAL_COMMUNITY): Payer: Medicare Other

## 2017-06-16 LAB — COMPREHENSIVE METABOLIC PANEL
ALBUMIN: 2.1 g/dL — AB (ref 3.5–5.0)
ALT: 17 U/L (ref 17–63)
ANION GAP: 3 — AB (ref 5–15)
AST: 42 U/L — AB (ref 15–41)
Alkaline Phosphatase: 63 U/L (ref 38–126)
BILIRUBIN TOTAL: 0.7 mg/dL (ref 0.3–1.2)
BUN: 29 mg/dL — ABNORMAL HIGH (ref 6–20)
CALCIUM: 8.6 mg/dL — AB (ref 8.9–10.3)
CO2: 22 mmol/L (ref 22–32)
Chloride: 117 mmol/L — ABNORMAL HIGH (ref 101–111)
Creatinine, Ser: 0.77 mg/dL (ref 0.61–1.24)
GFR calc Af Amer: >60 mL/min (ref >60)
GFR calc non Af Amer: >60 mL/min (ref >60)
GLUCOSE: 129 mg/dL — AB (ref 65–99)
Potassium: 3 mmol/L — ABNORMAL LOW (ref 3.5–5.1)
SODIUM: 142 mmol/L (ref 135–145)
TOTAL PROTEIN: 5.4 g/dL — AB (ref 6.5–8.1)

## 2017-06-16 LAB — CBC WITH DIFFERENTIAL/PLATELET
BASOS PCT: 0 %
Basophils Absolute: 0 10*3/uL (ref 0.0–0.1)
Eosinophils Absolute: 0.2 10*3/uL (ref 0.0–0.7)
Eosinophils Relative: 2 %
HCT: 23.3 % — ABNORMAL LOW (ref 39.0–52.0)
HEMOGLOBIN: 7.6 g/dL — AB (ref 13.0–17.0)
Lymphocytes Relative: 9 %
Lymphs Abs: 0.7 10*3/uL (ref 0.7–4.0)
MCH: 29.7 pg (ref 26.0–34.0)
MCHC: 32.6 g/dL (ref 30.0–36.0)
MCV: 91 fL (ref 78.0–100.0)
MONO ABS: 0.9 10*3/uL (ref 0.1–1.0)
MONOS PCT: 12 %
NEUTROS PCT: 77 %
Neutro Abs: 6 10*3/uL (ref 1.7–7.7)
PLATELETS: 164 10*3/uL (ref 150–400)
RBC: 2.56 MIL/uL — ABNORMAL LOW (ref 4.22–5.81)
RDW: 17.7 % — ABNORMAL HIGH (ref 11.5–15.5)
WBC: 7.7 10*3/uL (ref 4.0–10.5)

## 2017-06-16 LAB — GLUCOSE, CAPILLARY
GLUCOSE-CAPILLARY: 114 mg/dL — AB (ref 65–99)
GLUCOSE-CAPILLARY: 115 mg/dL — AB (ref 65–99)
GLUCOSE-CAPILLARY: 127 mg/dL — AB (ref 65–99)
GLUCOSE-CAPILLARY: 128 mg/dL — AB (ref 65–99)
GLUCOSE-CAPILLARY: 132 mg/dL — AB (ref 65–99)
Glucose-Capillary: 119 mg/dL — ABNORMAL HIGH (ref 65–99)
Glucose-Capillary: 129 mg/dL — ABNORMAL HIGH (ref 65–99)

## 2017-06-16 LAB — MAGNESIUM: Magnesium: 1.9 mg/dL (ref 1.7–2.4)

## 2017-06-16 LAB — AMMONIA: AMMONIA: 38 umol/L — AB (ref 9–35)

## 2017-06-16 MED ORDER — SODIUM CHLORIDE 0.9 % IV SOLN
INTRAVENOUS | Status: DC
  Administered 2017-06-16: 08:00:00 via INTRAVENOUS
  Filled 2017-06-16: qty 1000

## 2017-06-16 MED ORDER — ALBUMIN HUMAN 25 % IV SOLN
25.0000 g | Freq: Once | INTRAVENOUS | Status: AC
  Administered 2017-06-16: 25 g via INTRAVENOUS
  Filled 2017-06-16: qty 50

## 2017-06-16 MED ORDER — POTASSIUM CHLORIDE 20 MEQ PO PACK
40.0000 meq | PACK | Freq: Three times a day (TID) | ORAL | Status: DC
  Administered 2017-06-16 – 2017-06-19 (×12): 40 meq via ORAL
  Filled 2017-06-16 (×13): qty 2

## 2017-06-16 MED ORDER — FUROSEMIDE 10 MG/ML IJ SOLN
40.0000 mg | Freq: Two times a day (BID) | INTRAMUSCULAR | Status: AC
  Administered 2017-06-16 – 2017-06-17 (×3): 40 mg via INTRAVENOUS
  Filled 2017-06-16 (×3): qty 4

## 2017-06-16 NOTE — Progress Notes (Signed)
Sutures removed and barrier put down at bottom of trach. Ties changed

## 2017-06-16 NOTE — Progress Notes (Signed)
PROGRESS NOTE [facility]   Nathan Frank   UTM:[id number] DOB: 07/10/1941  DOA: 05/28/2017 PCP: Thressa Sheller, MD   Brief Narrative:  23 hepatitis C,  alcohol abuse with cirrhosis,  alcoholic cardiomyopathy and recently diagnosed with seizures  . Presented to the hospital with weakness and left arm tremor he was admitted for suspected seizures to the medical floor on 8/24  . Subsequently patient on 8/27 developed status epilepticus and altered mental status for which he required intubation for airway protection and was transferred to the ICU- elevated ammonia levels. Neurology was consulted, burst suppression was started on 8/28 which lasted for over 3 days.  At one point patient was hypotensive required, pressor for a short period.  Patient has been unable to be weaned off ventilator, tracheostomy was placed on 06/09/2017. Despite all interventions patient remains unresponsive. Given poor prognosis palliative care was consulted. MRI of brain and EEG was repeated consistent with seizures. Patient developed cellulitis of the neck been treated with bactrim   Subjective: Patient remains unresponsive even to menance and firm pressure, tolerating goal tube feeding No changes overnight  Assessment & Plan:  Status epilepticus - status post burst suppression - unchanged level of consciousness, non responsive Neurology following, AEDS per their recommendation Off sedation Continue Vimpat 50 q 12, Keppra 1000 q12 and phenytoin 100 q 12.  Phenytoin level noted - 7.7 on 9/10 Corrected leverl is 14 based on albumin Poor prognosis of meaningful recovery.   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 Continue to wean PS  Pulmonary following for vent settings--unlikely can ever wean off   Alcoholic cardiomyopathy/Afib with RVR - HR stable  EF 45% w severe TR, diffuse hypokinesis  Initially was hypotensive treated with pressor - now weaned off  Remains Amio gtt for intermittent Afib with RVR  On aspirin for anticoagulation for now, eventually will need NOAC ??  TCP 112--->164--would defer this till mental status improves a little and will need to discuss implications with family  Anemia & Thrombocytopenia - likely for cirrhosis and acute illness  Hemoglobin is now 7.6, Normocytic NO dark stools [has flexiseal]  Hypokalemia/Hypomagnesemia  Continue K packets 40 tid for now--recheck in am Get Mag in am   Neck cellulitis  Trach site with mild erythema, and some purulence. Patient has been started on Bactrim which may need to be changes in light of AKI Follow up closely if worse will get [place] to r/o abscess  Goals of care  Patient remains full code, eventually will need PEG tube? Will talk to family  DVT prophylaxis: SCD's  Code Status: FULL  Family Communication: None at bedside --is full code and hopeful for recovery--I am doubtful this will happen Disposition Plan: TBD    Consultants:   PCCM   Neurology   Procedures:   Rt IJ 8/28>>  Trach 9/5   Vent Mode: PRVC FiO2 (%):  [40 %] 40 % Set Rate:  [16 bmp] 16 bmp Vt Set:  [600 mL] 600 mL PEEP:  [5 cmH20] 5 cmH20 Pressure Support:  [  15 cmH20] 15 cmH20 Plateau Pressure:  [12 cmH20-22 cmH20] 21 cmH20   Antimicrobials: Anti-infectives    Start     Dose/Rate Route Frequency Ordered Stop   06/15/17 2200  sulfamethoxazole-trimethoprim (BACTRIM DS,SEPTRA DS) 800-160 MG per tablet 1 tablet     1 tablet Per Tube Every 12 hours 06/15/17 1052      06/13/17 1000  sulfamethoxazole-trimethoprim (BACTRIM,SEPTRA) 400-80 MG per tablet 1 tablet  Status:  Discontinued     1 tablet Oral Every 12 hours 06/13/17 0857 06/15/17 1052      Objective: Vitals:   06/16/17 0019 06/16/17 0300 06/16/17 0416 06/16/17 0448  BP: 97/66  101/60 101/60  Pulse: (!) 107  72 99  Resp:   16 16  Temp:   (!) 97.5 F (36.4 C)   TempSrc:   Axillary   SpO2: 100%  100% 100%  Weight:  93 kg (205 lb 0.4 oz)    Height:        Intake/Output Summary (Last 24 hours) at 06/16/17 0725 Last data filed at 06/16/17 0400  Gross per 24 hour  Intake          2347.73 ml  Output             4750 ml  Net         -2402.27 ml   Filed Weights   06/14/17 0244 06/15/17 0319 06/16/17 0300  Weight: 92 kg (202 lb 13.2 oz) 91.9 kg (202 lb 9.6 oz) 93 kg (205 lb 0.4 oz)    Examination:  General: Not responsive, deconditioned --Arcus senilis +, Catracts-poorly responsive pupils EENT: Trach in place, mild erythema surrounding trach, moist area around ostomy Cardiovascular: Irr Irr, S1S2, + systolic murmur , rate 100's Respiratory: Good air entry, no rales or crackles, on vent  Abdominal: Soft, NT, ND Neuro: Not responsive to verbal, tactile and painful stimuli.  Extremities: anasarca-swelling in arsm as well Skin: No rash, erythema on neck   Data Reviewed: I have personally reviewed following labs and imaging studies  CBC:  Recent Labs Lab 06/11/17 0536 06/11/17 1330 06/12/17 1009 06/13/17 0230 06/16/17 0425  WBC 10.8* 12.1* 9.4 10.4 7.7  NEUTROABS  --   --  6.8  --  6.0  HGB 9.8* 9.4* 8.9* 8.4* 7.6*  HCT 29.0* 27.5* 26.3* 24.5* 23.3*  MCV 89.2 88.7 88.3 88.1 91.0  PLT 136* 135* 126* 112* 164   Basic Metabolic Panel:  Recent Labs Lab 06/11/17 0536 06/12/17 1009 06/13/17 0230 06/14/17 0500 06/15/17 0524 06/16/17 0425  NA 138 136 136 140 140 142  K 2.6* 2.6* 3.0* 3.5 2.8* 3.0*  CL 109 106 107 110 112* 117*  CO2 22 23 24 24 22 22  GLUCOSE 138* 153* 146*  131* 128* 129*  BUN 13 17 22* 23* 27* 29*  CREATININE 0.61 0.77 0.84 0.73 0.79 0.77  CALCIUM 8.1* 8.1* 8.1* 8.5* 8.6* 8.6*  MG 1.4* 1.5* 2.0  --  1.7 1.9  PHOS 2.5  --  2.1*  --   --   --    GFR: Estimated Creatinine Clearance: 91.6 mL/min (by C-G formula based on SCr of 0.77 mg/dL). Liver Function Tests:  Recent Labs Lab 06/12/17 1009 06/13/17 0230 06/13/17 2150 06/16/17 0425  AST 66* 57*  --  42*  ALT 21 20  --  17  ALKPHOS 61 65  --  63  BILITOT 0.7 0.8  --  0.7  PROT 5.0* 5.3*  --  5.4*  ALBUMIN 1.5* 1.6*   1.6* 2.1*   No results for input(s): LIPASE, AMYLASE in the last 168 hours.  Recent Labs Lab 06/12/17 1009 06/13/17 0230 06/14/17 0521 06/15/17 1925 06/16/17 0425  AMMONIA 71* 89* 70* 58* 38*   Coagulation Profile: No results for input(s): INR, PROTIME in the last 168 hours. Cardiac Enzymes: No results for input(s): CKTOTAL, CKMB, CKMBINDEX, TROPONINI in the last 168 hours. BNP (last 3 results) No results for input(s): PROBNP in the last 8760 hours. HbA1C: No results for input(s): HGBA1C in the last 72 hours. CBG:  Recent Labs Lab 06/15/17 1225 06/15/17 1540 06/15/17 2007 06/16/17 0019 06/16/17 0420  GLUCAP 130* 118* 109* 132* 127*   Lipid Profile: No results for input(s): CHOL, HDL, LDLCALC, TRIG, CHOLHDL, LDLDIRECT in the last 72 hours. Thyroid Function Tests: No results for input(s): TSH, T4TOTAL, FREET4, T3FREE, THYROIDAB in the last 72 hours. Anemia Panel: No results for input(s): VITAMINB12, FOLATE, FERRITIN, TIBC, IRON, RETICCTPCT in the last 72 hours. Sepsis Labs: No results for input(s): PROCALCITON, LATICACIDVEN in the last 168 hours.  No results found for this or any previous visit (from the past 240 hour(s)).    Radiology Studies: Mr Brain Wo Contrast  Result Date: 06/14/2017 CLINICAL DATA:  Initial evaluation for acute altered mental status, history of seizure. EXAM: MRI HEAD WITHOUT CONTRAST TECHNIQUE: Multiplanar, multiecho  pulse sequences of the brain and surrounding structures were obtained without intravenous contrast. COMPARISON:  Prior MRI from 05/29/2017. FINDINGS: Brain: Moderately advanced cerebral atrophy. Underline confluent and patchy/ FLAIR abnormality within the periventricular and deep white matter, most consistent with chronic microvascular ischemic disease. Multifocal areas of encephalomalacia and gliosis involving the anterior right frontal lobe, left parietal lobe, and throughout the right temporoparietal region consistent with remote ischemic infarcts. Extensive laminar necrosis throughout the remote right temporoparietal infarct. Superimposed remote lacunar infarcts within the right basal ganglia. There is subtly increased gyriform diffusion abnormality within the mesial right temporal lobe, with involvement of the right hippocampus (Series 4, image 15), extension into the parasagittal right frontoparietal region (series 3, image 31), as well as about the right parietal encephalomalacia (series 3, image 34). Findings consistent with changes related to underlying seizure, and are similar in distribution relative to previous MRI, although are much less evident/prominent as compared to previous exam. No new intracranial hemorrhage or infarct. No midline shift, mass effect, or mass lesion. Stable ventricular prominence without hydrocephalus. No extra-axial fluid collection. Vascular: Major intracranial vascular flow voids maintained. Skull and upper cervical spine: Craniocervical junction within normal limits. Visualized upper cervical spine unremarkable. Bone marrow signal intensity normal. No scalp soft tissue abnormality. Sinuses/Orbits: Globes and orbital soft tissues within normal limits. Scattered mucosal thickening within the ethmoidal air cells and maxillary sinuses, with superimposed fluid level in the left maxillary sinus. Fluid seen layering within the posterior nasopharynx. Nasogastric tube in place.  Bilateral mastoid effusions noted. Other: None. IMPRESSION: 1. Minimal gyriform diffusion abnormality involving the right cerebral hemisphere, greatest at the mesial right temporal lobe, most consistent with changes related to seizure. Changes are stable in distribution as compared to those seen on recent MRI, although are much less prominent as compared to previous exam. Findings may reflect the residua of previously seen seizure and/or new but more mild seizure activity. 2. No other acute intracranial abnormality. Underlying chronic right greater than left hemispheric encephalomalacia. Electronically Signed   By: Benjamin  McClintock M.D.   On: 06/14/2017 21:05      Scheduled Meds: . chlorhexidine gluconate (MEDLINE KIT)  15 mL   Mouth Rinse BID  . Chlorhexidine Gluconate Cloth  6 each Topical Daily  . folic acid  1 mg Per Tube Daily  . lactulose  30 g Oral TID  . levothyroxine  50 mcg Per Tube QAC breakfast  . mouth rinse  15 mL Mouth Rinse QID  . metoprolol tartrate  25 mg Per NG tube TID  . pantoprazole sodium  40 mg Per Tube Daily  . phenytoin (DILANTIN) IV  100 mg Intravenous Q12H  . pregabalin  75 mg Per Tube BID  . scopolamine  1 patch Transdermal Q72H  . sodium chloride flush  10-40 mL Intracatheter Q12H  . sulfamethoxazole-trimethoprim  1 tablet Per Tube Q12H  . thiamine  100 mg Per Tube Daily   Continuous Infusions: . amiodarone 30 mg/hr (06/16/17 0643)  . dextrose 5 % and 0.45% NaCl 10 mL/hr (06/11/17 1423)  . feeding supplement (VITAL AF 1.2 CAL) 1,000 mL (06/16/17 0551)  . lacosamide (VIMPAT) IV Stopped (06/15/17 2258)  . levETIRAcetam Stopped (06/15/17 2126)     LOS: 18 days    Verneita Griffes, MD [facility] [phone]

## 2017-06-16 NOTE — Progress Notes (Signed)
S/O: EEG on 9/10 revealed no epileptiform discharges or electrographic seizures. It was abnormal due to the presence of moderate to severe diffuse slowing of the background and uppression over the right hemisphere.   He remains intubated. Family expressed wish for full medical management, including PEG tube. He is status post tracheostomy placement. He is on Bactrim for neck cellulitis/abscess. CCM is concerned that he may not be weanable from the vent. His mental status has improved somewhat, but he is not interactive.   MRI brain completed on 9/10, revealing minimal gyriform diffusion abnormality involving the right cerebral hemisphere, greatest at the mesial right temporal lobe, most consistent with changes related to seizure. Changes are stable in distribution as compared to those seen on recent MRI, although are much less prominent as compared to previous exam. Findings may reflect the residua of previously seen seizure and/or new but more mild seizure activity. No other acute intracranial abnormality. Underlying chronic right greater than left hemispheric encephalomalacia.  Current medications reviewed:  Current Facility-Administered Medications:  .  acetaminophen (TYLENOL) solution 650 mg, 650 mg, Per Tube, Q6H PRN, Sood, Vineet, MD, 650 mg at 06/14/17 1231 .  [EXPIRED] amiodarone (NEXTERONE PREMIX) 360-4.14 MG/200ML-% (1.8 mg/mL) IV infusion, 60 mg/hr, Intravenous, Continuous, Last Rate: 33.3 mL/hr at 06/10/17 1600, 60 mg/hr at 06/10/17 1600 **FOLLOWED BY** amiodarone (NEXTERONE PREMIX) 360-4.14 MG/200ML-% (1.8 mg/mL) IV infusion, 30 mg/hr, Intravenous, Continuous, Feinstein, Daniel J, MD, Last Rate: 16.7 mL/hr at 06/16/17 0643, 30 mg/hr at 06/16/17 0643 .  artificial tears (LACRILUBE) ophthalmic ointment, , Both Eyes, Q3H PRN, Whiteheart, Kathryn A, NP .  chlorhexidine gluconate (MEDLINE KIT) (PERIDEX) 0.12 % solution 15 mL, 15 mL, Mouth Rinse, BID, Sood, Vineet, MD, 15 mL at 06/15/17 2145 .   Chlorhexidine Gluconate Cloth 2 % PADS 6 each, 6 each, Topical, Daily, Sood, Vineet, MD, 6 each at 06/15/17 0938 .  dextrose 5 %-0.45 % sodium chloride infusion, , Intravenous, Continuous, Samtani, Jai-Gurmukh, MD, Last Rate: 10 mL/hr at 06/11/17 1423, 10 mL/hr at 06/11/17 1423 .  feeding supplement (VITAL AF 1.2 CAL) liquid 1,000 mL, 1,000 mL, Per Tube, Continuous, Silva Zapata, Edwin, MD, Last Rate: 70 mL/hr at 06/16/17 0551, 1,000 mL at 06/16/17 0551 .  fentaNYL (SUBLIMAZE) injection 50 mcg, 50 mcg, Intravenous, Q2H PRN, Sood, Vineet, MD .  folic acid (FOLVITE) tablet 1 mg, 1 mg, Per Tube, Daily, Silva Zapata, Edwin, MD, 1 mg at 06/15/17 1705 .  lacosamide (VIMPAT) 50 mg in sodium chloride 0.9 % 25 mL IVPB, 50 mg, Intravenous, Q12H, Arora, Ashish, MD, Stopped at 06/15/17 2258 .  lactulose (CHRONULAC) 10 GM/15ML solution 30 g, 30 g, Oral, TID, Arrien, Mauricio Daniel, MD, 30 g at 06/15/17 2108 .  levETIRAcetam (KEPPRA) 1,000 mg in sodium chloride 0.9 % 100 mL IVPB, 1,000 mg, Intravenous, Q12H, Arora, Ashish, MD, Stopped at 06/15/17 2126 .  levothyroxine (SYNTHROID, LEVOTHROID) tablet 50 mcg, 50 mcg, Per Tube, QAC breakfast, Silva Zapata, Edwin, MD .  MEDLINE mouth rinse, 15 mL, Mouth Rinse, QID, Silva Zapata, Edwin, MD, 15 mL at 06/16/17 0335 .  metoprolol tartrate (LOPRESSOR) injection 2.5-5 mg, 2.5-5 mg, Intravenous, Q4H PRN, Deterding, Elizabeth C, MD, 5 mg at 06/11/17 0845 .  metoprolol tartrate (LOPRESSOR) tablet 25 mg, 25 mg, Per NG tube, TID, Arrien, Mauricio Daniel, MD, 25 mg at 06/15/17 2144 .  midazolam (VERSED) injection 1 mg, 1 mg, Intravenous, Q2H PRN, Sood, Vineet, MD .  [DISCONTINUED] ondansetron (ZOFRAN) tablet 4 mg, 4 mg, Oral, Q6H PRN **OR** ondansetron (  ZOFRAN) injection 4 mg, 4 mg, Intravenous, Q6H PRN, Gardner, Jared M, DO, 4 mg at 06/03/17 1548 .  pantoprazole sodium (PROTONIX) 40 mg/20 mL oral suspension 40 mg, 40 mg, Per Tube, Daily, Silva Zapata, Edwin, MD .  phenytoin  (DILANTIN) injection 100 mg, 100 mg, Intravenous, Q12H, Arrien, Mauricio Daniel, MD, 100 mg at 06/15/17 2110 .  pregabalin (LYRICA) capsule 75 mg, 75 mg, Per Tube, BID, Arora, Ashish, MD, 75 mg at 06/15/17 2109 .  scopolamine (TRANSDERM-SCOP) 1 MG/3DAYS 1.5 mg, 1 patch, Transdermal, Q72H, Silva Zapata, Edwin, MD, 1.5 mg at 06/15/17 0938 .  sodium chloride flush (NS) 0.9 % injection 10-40 mL, 10-40 mL, Intracatheter, Q12H, Sood, Vineet, MD, 10 mL at 06/15/17 2146 .  sodium chloride flush (NS) 0.9 % injection 10-40 mL, 10-40 mL, Intracatheter, PRN, Sood, Vineet, MD .  sulfamethoxazole-trimethoprim (BACTRIM DS,SEPTRA DS) 800-160 MG per tablet 1 tablet, 1 tablet, Per Tube, Q12H, Turner, Michelle S, RPH, 1 tablet at 06/15/17 2107 .  thiamine (VITAMIN B-1) tablet 100 mg, 100 mg, Per Tube, Daily, Silva Zapata, Edwin, MD   Examination: BP 98/60   Pulse (!) 107   Temp 98.7 F (37.1 C) (Axillary)   Resp 18   Ht 5' 11" (1.803 m)   Wt 93 kg (205 lb 0.4 oz)   SpO2 91%   BMI 28.60 kg/m   Neurological Exam: Mental Status: Lying in bed with eyes closed. No volitional responses to any stimuli. No attempts to communicate.  Cranial Nerves: II:  No blink to threat. Right pupil 3 mm and sluggishly reactive. Left pupil 4 mm and reactive.  III,IV, VI: Eyes at midline. No spontaneous EOM. No doll's eye reflex. No nystagmus seen. V,VII: Face flaccidly symmetric. Weak blink to left eyelash stimulation; no response on right. VIII: No response to auditory stimuli XI: Head at midline XII: No tongue fasciculations Motor/Sensory: Bilateral upper extremities: No movement to noxious stimuli. Increased triceps tone bilaterally.  Bilateral lower extremities: Trace dorsiflexion of feet bilaterally with plantar stimulation.  Weakly blinks during noxious stimuli of LUE and LLE; no response when stimulating RUE or RLE.  Plantars:  Equivocal bilaterally.  Cerebellar/Gait: Unable to assess  A/R: 76-year-old male,  history of old stroke, presented with partial status epilepticus. He was in burst suppression for over 3 days and required multiple antiepileptics to control his partial seizures. 1. Continue Vimpat at 50 mg BID, Keppra at 1000 mg BID, Dilantin 100 mg BID and PRN Versed. Pharmacy is monitoring Dilantin dosing. Most recent EEG shows no electrographic seizures.  2. Multiple regions of chronic infarction and gliosis on his MRI. Faint cortical signal abnormality on DWI appears most consistent with residual cytotoxic edema from ongoing seizures, which are now resolved based upon EEG from 9/10.    Electronically signed: Dr.      

## 2017-06-17 LAB — BASIC METABOLIC PANEL
Anion gap: 6 (ref 5–15)
BUN: 31 mg/dL — AB (ref 6–20)
CALCIUM: 8.7 mg/dL — AB (ref 8.9–10.3)
CO2: 22 mmol/L (ref 22–32)
Chloride: 117 mmol/L — ABNORMAL HIGH (ref 101–111)
Creatinine, Ser: 0.78 mg/dL (ref 0.61–1.24)
GFR calc Af Amer: >60 mL/min (ref >60)
GLUCOSE: 123 mg/dL — AB (ref 65–99)
POTASSIUM: 3.3 mmol/L — AB (ref 3.5–5.1)
SODIUM: 145 mmol/L (ref 135–145)

## 2017-06-17 LAB — CBC WITH DIFFERENTIAL/PLATELET
BASOS ABS: 0 10*3/uL (ref 0.0–0.1)
Basophils Relative: 1 %
Eosinophils Absolute: 0.1 10*3/uL (ref 0.0–0.7)
Eosinophils Relative: 2 %
HEMATOCRIT: 22.7 % — AB (ref 39.0–52.0)
HEMOGLOBIN: 7.4 g/dL — AB (ref 13.0–17.0)
LYMPHS PCT: 12 %
Lymphs Abs: 0.7 10*3/uL (ref 0.7–4.0)
MCH: 30 pg (ref 26.0–34.0)
MCHC: 32.6 g/dL (ref 30.0–36.0)
MCV: 91.9 fL (ref 78.0–100.0)
Monocytes Absolute: 0.6 10*3/uL (ref 0.1–1.0)
Monocytes Relative: 10 %
Neutro Abs: 4.8 10*3/uL (ref 1.7–7.7)
Neutrophils Relative %: 77 %
PLATELETS: 212 10*3/uL (ref 150–400)
RBC: 2.47 MIL/uL — AB (ref 4.22–5.81)
RDW: 18.2 % — ABNORMAL HIGH (ref 11.5–15.5)
WBC: 6.2 10*3/uL (ref 4.0–10.5)

## 2017-06-17 LAB — MAGNESIUM
MAGNESIUM: 1.8 mg/dL (ref 1.7–2.4)
Magnesium: 1.8 mg/dL (ref 1.7–2.4)

## 2017-06-17 LAB — GLUCOSE, CAPILLARY
GLUCOSE-CAPILLARY: 98 mg/dL (ref 65–99)
GLUCOSE-CAPILLARY: 117 mg/dL — AB (ref 65–99)
Glucose-Capillary: 111 mg/dL — ABNORMAL HIGH (ref 65–99)
Glucose-Capillary: 108 mg/dL — ABNORMAL HIGH (ref 65–99)
Glucose-Capillary: 124 mg/dL — ABNORMAL HIGH (ref 65–99)

## 2017-06-17 MED ORDER — AMIODARONE HCL 200 MG PO TABS
200.0000 mg | ORAL_TABLET | Freq: Every day | ORAL | Status: DC
  Administered 2017-06-17 – 2017-06-19 (×3): 200 mg via ORAL
  Filled 2017-06-17 (×4): qty 1

## 2017-06-17 MED ORDER — METOPROLOL TARTRATE 50 MG PO TABS
50.0000 mg | ORAL_TABLET | Freq: Two times a day (BID) | ORAL | Status: DC
  Administered 2017-06-18 – 2017-06-24 (×13): 50 mg via NASOGASTRIC
  Filled 2017-06-17 (×13): qty 1

## 2017-06-17 MED ORDER — SCOPOLAMINE 1 MG/3DAYS TD PT72
1.0000 | MEDICATED_PATCH | TRANSDERMAL | Status: DC
  Administered 2017-06-17 – 2017-06-23 (×3): 1.5 mg via TRANSDERMAL
  Filled 2017-06-17 (×3): qty 1

## 2017-06-17 MED ORDER — MAGNESIUM SULFATE IN D5W 1-5 GM/100ML-% IV SOLN
1.0000 g | Freq: Once | INTRAVENOUS | Status: AC
  Administered 2017-06-17: 1 g via INTRAVENOUS
  Filled 2017-06-17 (×2): qty 100

## 2017-06-17 MED ORDER — AMIODARONE HCL 200 MG PO TABS
400.0000 mg | ORAL_TABLET | Freq: Two times a day (BID) | ORAL | Status: DC
  Administered 2017-06-17: 400 mg via ORAL
  Filled 2017-06-17: qty 2

## 2017-06-17 NOTE — Progress Notes (Signed)
Daily Progress Note   Patient Name: HIROTO SALTZMAN       Date: 06/17/2017 DOB: 1940-11-27  Age: 76 y.o. MRN#: [id number] Attending Physician: Nita Sells, MD Primary Care Physician: Thressa Sheller, MD Admit Date: 05/28/2017  Reason for Consultation/Follow-up: Establishing goals of care  Subjective: Patient resting in bed with eyes closed, unresponsive. Tracheostomy tube and NG feeding tube present. Spoke with son regarding current status. He states he has family coming from out of town to see Mr. Aida Puffer. He is concerned that his mother has dementia and her capacity waxes and wanes; she lives with him. He has 2 other sisters. He is concerned the family could develop feelings about his father's planned care and disposition that his mother does not agree with. Family meeting planned for Monday at 3:30pm with Dr. Domingo Cocking.     Length of Stay: 19  Current Medications: Scheduled Meds:  . amiodarone  400 mg Oral BID  . chlorhexidine gluconate (MEDLINE KIT)  15 mL Mouth Rinse BID  . Chlorhexidine Gluconate Cloth  6 each Topical Daily  . folic acid  1 mg Per Tube Daily  . lactulose  30 g Oral TID  . levothyroxine  50 mcg Per Tube QAC breakfast  . mouth rinse  15 mL Mouth Rinse QID  . metoprolol tartrate  25 mg Per NG tube TID  . pantoprazole sodium  40 mg Per Tube Daily  . phenytoin (DILANTIN) IV  100 mg Intravenous Q12H  . potassium chloride  40 mEq Oral TID  . pregabalin  75 mg Per Tube BID  . scopolamine  1 patch Transdermal Q72H  . sodium chloride flush  10-40 mL Intracatheter Q12H  . sulfamethoxazole-trimethoprim  1 tablet Per Tube Q12H  . thiamine  100 mg Per Tube Daily    Continuous Infusions: . feeding supplement (VITAL AF 1.2 CAL) 1,000 mL (06/16/17 2119)  . lacosamide  (VIMPAT) IV Stopped (06/17/17 1033)  . levETIRAcetam Stopped (06/17/17 1200)    PRN Meds: acetaminophen (TYLENOL) oral liquid 160 mg/5 mL, artificial tears, fentaNYL (SUBLIMAZE) injection, midazolam, [DISCONTINUED] ondansetron **OR** ondansetron (ZOFRAN) IV, sodium chloride flush  Physical Exam  Constitutional: No distress.  HENT:  Head: Normocephalic.  Cardiovascular:  Warm and dry.   Pulmonary/Chest:  Ventilator and tracheostomy.   Musculoskeletal: He exhibits edema.  Neurological:  Unresponsive.  Skin: Skin is warm and dry.            Vital Signs: BP 100/72 (BP Location: Left Arm)   Pulse 100   Temp 98 F (36.7 C) (Axillary)   Resp (!) 22   Ht 5' 11"  (1.803 m)   Wt 93.1 kg (205 lb 4 oz)   SpO2 100%   BMI 28.63 kg/m  SpO2: SpO2: 100 % O2 Device: O2 Device: Ventilator O2 Flow Rate:    Intake/output summary:  Intake/Output Summary (Last 24 hours) at 06/17/17 1234 Last data filed at 06/17/17 0930  Gross per 24 hour  Intake           1717.1 ml  Output             4000 ml  Net          -2282.9 ml   LBM: Last BM Date: 06/16/17 Baseline Weight: Weight: 81.6 kg (180 lb) Most recent weight: Weight: 93.1 kg (205 lb 4 oz)       Palliative Assessment/Data: 20%    Flowsheet Rows     Most Recent Value  Intake Tab  Referral Department  Hospitalist  Unit at Time of Referral  ICU  Palliative Care Primary Diagnosis  Other (Comment) [liver disease]  Date Notified  06/11/17  Palliative Care Type  New Palliative care  Reason for referral  Clarify Goals of Care  Date of Admission  05/28/17  # of days IP prior to Palliative referral  14  Clinical Assessment  Psychosocial & Spiritual Assessment  Palliative Care Outcomes      Patient Active Problem List   Diagnosis Date Noted  . Compromised airway   . Aspiration into airway   . Seizures ([place])   . Acute encephalopathy   . 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 Hepatitis C 05/12/2017  . Atrial fibrillation, chronic ([place]) 05/12/2017  . History of alcohol abuse 05/12/2017  . Hypothyroidism 05/12/2017  . Chronic systolic congestive heart failure, NYHA class 1 ([place]) 05/12/2017  . Heroin abuse 05/12/2017  . Partial seizure disorder ([place]) 05/12/2017  . AKI (acute kidney injury) ([place])   . Demand ischemia ([place])   . NICM (nonischemic cardiomyopathy) ([place])   . Syncope   . Atrial fibrillation with RVR ([place]) 11/12/2016  . ARF (acute renal failure) ([place]) 11/12/2016  . Failure to thrive in adult   . Cellulitis of left knee 08/20/2016  . Back injury 08/20/2016  . Chronic systolic CHF (congestive heart failure) ([place]) 08/20/2016  . Atrial fibrillation with rapid ventricular response ([place]) 08/06/2016  . Acute blood loss anemia   . Lymphocytosis   . Atrial tachycardia ([place])   . Acute ischemic right MCA stroke ([place]) 07/04/2016  . Obesity 07/01/2016  . Family hx-stroke 07/01/2016  . Dysphagia, post-stroke   . Dysarthria, post-stroke   . Polysubstance abuse   . Chronic combined systolic and diastolic CHF (congestive heart failure) ([place])   . Mitral valve regurgitation   . Anemia of chronic disease   . Cerebral infarction due to embolism of right middle cerebral artery ([place]) - s/p mechanical thrombectomy 06/25/2016  . Acute respiratory failure ([place])   . Acute on chronic diastolic CHF (congestive heart failure) ([place]) 06/09/2016  . History of CVA (cerebrovascular accident) 06/05/2016  . Alcoholic cirrhosis of liver without ascites ([place])   . Other specified hypothyroidism   . Hepatocellular carcinoma ([place])   . Generalized weakness 05/27/2016  .  Hypotension 04/18/2016  . Chronic atrial fibrillation ([place]) 04/18/2016  . Prolonged Q-T interval on ECG   . Neuropathy   . Essential hypertension   . Thrombocytopenia ([place]) 04/16/2015  . Abnormal TSH 04/16/2015  . NSTEMI (non-ST elevated myocardial infarction) ([place])   . Acute gastritis with  hemorrhage   . GI bleed due to NSAIDs 12/13/2014  . Upper GI bleeding 12/13/2014  . Alcohol dependence ([place]) 02/02/2014  . Depressive disorder 02/01/2014  . Symptomatic cholelithiasis 12/15/2013  . DVT of lower limb, acute ([place]) 06/06/2012  . Alcohol abuse 06/04/2012  . Liver cirrhosis ([place]) 06/04/2012    Palliative Care Assessment & Plan   Patient Profile: Ren Aspinall Tatumis a 76 y.o.malewith medical history significant of CVA, residual L sided hemiparesis, and seizure disorder. He has been in an SNF since just after discharge on the 13th. He returned due to seizure activity.    Assessment:     Mr. Duque is resting in bed with a tracheostomy on a ventilator. He has a poor prognosis. Spoke with Dr. Verlon Au who states patient opened his eyes and yawned today, attempted to relay this information to family however phone call sent directly to VM.     Recommendations/Plan:  Family meeting Monday at 3:30 at bedside.   Goals of Care and Additional Recommendations:  Full code.   Code Status:    Code Status Orders        Start     Ordered   05/29/17 0157  Full code  Continuous     05/29/17 0157    Code Status History    Date Active Date Inactive Code Status Order ID Comments User Context   05/12/2017  8:51 PM 05/17/2017  6:43 PM Full Code [id number]  Samella Parr, NP Inpatient   05/12/2017 10:39 AM 05/12/2017  8:51 PM Full Code [id number]  Samella Parr, NP ED   11/12/2016 10:06 PM 11/15/2016  7:12 PM Full Code [id number]  Rise Patience, MD ED   08/20/2016  8:10 PM 08/23/2016  6:46 PM Full Code [id number]  Ivor Costa, MD ED   08/06/2016  4:09 PM 08/09/2016  3:07 PM Full Code [id number]  Radene Gunning, NP ED   07/04/2016  6:24 PM 07/30/2016  3:57 PM Full Code [id number]  Bary Leriche, PA-C Inpatient   06/25/2016  6:44 PM 06/29/2016 11:37 AM Full Code [id number]  Luanne Bras, MD Inpatient   06/25/2016  3:10 PM 06/25/2016  6:44 PM Full Code [id number]  Marliss Coots, PA-C ED    06/09/2016 10:45 PM 06/16/2016 11:47 PM Full Code [id number]  Toy Baker, MD Inpatient   05/27/2016 10:08 PM 06/03/2016  6:30 PM Full Code [id number]  Rise Patience, MD Inpatient   04/28/2016  8:13 PM 05/01/2016  9:18 PM Full Code [id number]  Ivor Costa, MD ED   04/19/2016 12:08 AM 04/22/2016  2:54 PM Full Code [id number]  Ivor Costa, MD ED   04/15/2015  2:16 PM 04/16/2015  4:04 PM Full Code [id number]  Belva Crome, MD Inpatient   04/13/2015  2:13 AM 04/15/2015  2:16 PM Full Code [id number]  Minus Breeding, MD ED   12/13/2014  3:48 PM 12/15/2014  2:34 PM Full Code [id number]  Modena Jansky, MD Inpatient   02/20/2014  9:36 PM 02/22/2014  4:39 PM Full Code [id number]  Shanda Howells, MD ED   02/02/2014 12:23 AM 02/05/2014  6:20 PM Full Code [id number]  Earleen Newport, NP Inpatient   01/31/2014 10:10 PM 02/02/2014  12:23 AM Full Code [id number]  Geanie Cooley ED   12/14/2013 11:08 AM 12/15/2013  6:36 PM Full Code [id number]  Hester Mates, MD Inpatient   06/05/2012 12:19 AM 06/11/2012  6:11 PM Full Code [id number]  Jaynie Crumble, RN Inpatient       Prognosis:   Unable to determine  Discharge Planning:  To Be Determined  Care plan was discussed with Dr. Verlon Au  Thank you for allowing the Palliative Medicine Team to assist in the care of this patient.   Total Time 1 hour 20 minutes Prolonged Time Billed        Greater than 50%  of this time was spent counseling and coordinating care related to the above assessment and plan.  Asencion Gowda, NP 06/17/2017 1:39 PM Office: [phone] 7am-7pm  Call primary team after hours   Please contact Palliative Medicine Team phone at [phone] for questions and concerns.

## 2017-06-17 NOTE — Progress Notes (Signed)
QTc has been prolonged this AM ranging from 0.49 to 0.53. Pt remains in afib controlled.  Relayed to attending MD

## 2017-06-17 NOTE — Progress Notes (Signed)
Pts QTc still elevated. Relayed to attending MD

## 2017-06-17 NOTE — Progress Notes (Signed)
QtC checkedon surface EKG confirmed to be 560 [place] dose amio 400 bid--> 200 daily If persisting will ask cardiology to weigh in re: changing over to Digoxin for rate control replacing Magnesium aggressively with 1 gm Mag now for mag below 2.0  Verneita Griffes, MD [facility] [phone]

## 2017-06-17 NOTE — Consult Note (Addendum)
Reason for Consult: Afib, concern for long QT interval Referring Physician: Verneita Griffes, MD  Nathan Frank is an 76 y.o. male.   HPI: 76 year old male with history of CVA, residual left-sided hemiparesis, history of partial seizures, history of heroine and alcohol abuse, widowed from skilled nursing facility on 05/29/2017 with ongoing weakness and concern for seizures. Hospital stay, dictated by endotracheal intubation during status epilepticus. Now he has a tracheostomy tube in place. Multiple medical problems including but not limited to hypertension, hepatitis C, GERD, history of recurrent DVT, liver cirrhosis, alcohol dependence.   CAD with history of non-STEMI in 2016 with patent coronaries on heart catheterization. Cardiology consulted for management of atrial fibrillation. Prior to admission, patient was on metoprolol 12.5 milligrams twice a day for rate control and that began try and one 150 mg daily for anticoagulation. While in the hospital, he has required amiodarone IV for control of A. fib with RVR.    EKG 07/04/2017 1628: Atrial fibrillation with average ventricular rate 100 bpm. Right bundle branch block, left anterior fascicular block. Nonspecific ST-T changes. Uncorrected QT interval 400 ms.   Echocardiogram 05/13/2017: Left ventricle: The cavity size was normal. There was mild   concentric hypertrophy. Systolic function was mildly reduced. The   estimated ejection fraction was in the range of 45% to 50%.   Diffuse hypokinesis. The study was not technically sufficient to   allow evaluation of LV diastolic dysfunction due to atrial   fibrillation. - Aortic valve: Trileaflet; mildly thickened, mildly calcified   leaflets. There was trivial regurgitation. - Mitral valve: There was mild regurgitation. Valve area by   pressure half-time: 1.75 cm^2. - Left atrium: The atrium was moderately dilated. - Right ventricle: The cavity size was moderately dilated. Wall   thickness was  normal. Systolic function was mildly reduced. - Right atrium: The atrium was severely dilated. - Tricuspid valve: There was moderate-severe regurgitation. - Pulmonary arteries: Systolic pressure was mildly increased. PA   peak pressure: 32 mm Hg (S).    Past Medical History:  Diagnosis Date  . Abnormal TSH   . Acute gastric ulcer   . Acute gastritis with hemorrhage   . Alcohol abuse   . Alcohol dependence ([place]) 02/02/2014  . Anemia   . Bifascicular block   . Cirrhosis ([place]) 06/04/2012  . Depressive disorder 02/01/2014  . Dizziness and giddiness 12/13/2014  . DVT of lower limb, acute ([place]) 06/06/2012  . DVT, lower extremity, recurrent ([place])    a. noted 2013. b. also dx 06/2016.  Marland Kitchen Essential hypertension   . Fatty liver   . Gallstones   . Gastritis 06/07/2012  . GERD (gastroesophageal reflux disease)   . GI bleed due to NSAIDs 12/13/2014  . Granulomatous gastritis   . Hematuria 06/04/2012  . Hepatitis C   . Hepatocellular carcinoma ([place])   . Heroin abuse    "I haven't done that since I don't know when."  . Heroin overdose 02/20/2014  . Neuropathy   . NICM (nonischemic cardiomyopathy) ([place])    a. 04/2015: EF 45-50% by cath. b. EF 40-45% by echo 06/2016.  . NSTEMI (non-ST elevated myocardial infarction) ([place])    a. 04/2015 - patent coronaries. Etiology possibly due to coronary spasm versus embolus, stress cardiomyopathy (atypical), and aborted infarction related to plaque rupture with thrombosis and dissolution. Amlodipine started. Not on antiplatelets due to GIB/cirrhosis history.  . Oral thrush 06/05/2012  . Polysubstance abuse    THC, alcohol, heroin  . Prolonged Q-T interval on  ECG    a. 12/2014 - treated with magnesium.  . Right knee pain 12/13/2014  . S/P alcohol detoxification 02/02/2014  . Stroke ([place]) 06/2016  . SVT (supraventricular tachycardia) ([place])    a. 12/2014 in setting of GIB, ETOH, NSAIDS, gastritis.  . Symptomatic cholelithiasis 12/15/2013  . Thrombocytopenia ([place])   .  Upper GI bleeding 12/13/2014  . Weight loss 06/04/2012    Past Surgical History:  Procedure Laterality Date  . CARDIAC CATHETERIZATION N/A 04/15/2015   Procedure: Left Heart Cath and Coronary Angiography;  Surgeon: Belva Crome, MD;  Location: [facility];  Service: Cardiovascular;  Laterality: N/A;  . CIRCUMCISION    . ESOPHAGOGASTRODUODENOSCOPY  06/07/2012   Procedure: ESOPHAGOGASTRODUODENOSCOPY (EGD);  Surgeon: Milus Banister, MD;  Location: [facility];  Service: Endoscopy;  Laterality: N/A;  may need treatment of varices  . ESOPHAGOGASTRODUODENOSCOPY N/A 12/14/2014   Procedure: ESOPHAGOGASTRODUODENOSCOPY (EGD);  Surgeon: Jerene Bears, MD;  Location[facility];  Service: Endoscopy;  Laterality: N/A;  . IR GENERIC HISTORICAL  06/25/2016   IR US GUIDE VASC ACCESS LEFT 06/25/2016 Corrie Mckusick, DO [facility]  . IR GENERIC HISTORICAL  06/25/2016   IR RADIOLOGY PERIPHERAL GUIDED IV START 06/25/2016 Corrie Mckusick, DO [facility]  . IR GENERIC HISTORICAL  06/25/2016   IR PERCUTANEOUS ART THROMBECTOMY/INFUSION INTRACRANIAL INC DIAG ANGIO 06/25/2016 Luanne Bras, MD [facility]  . RADIOLOGY WITH ANESTHESIA N/A 06/25/2016   Procedure: RADIOLOGY WITH ANESTHESIA;  Surgeon: Luanne Bras, MD;  Location: [facility];  Service: Radiology;  Laterality: N/A;  . TONSILLECTOMY      Family History  Problem Relation Age of Onset  . Stroke Father   . Prostate cancer Brother   . Heart disease Mother        Pacemaker  . Diabetes Neg Hx     Social History:  reports that he has quit smoking. His smoking use included Cigarettes. He has a 2.50 pack-year smoking history. He has never used smokeless tobacco. He reports that he does not drink alcohol or use drugs.  Allergies:  Allergies  Allergen Reactions  . Nsaids Other (See Comments)    Acute gastric ulcers  . Oxycodone Other (See Comments)    Per the risk calculating tool RIOSORD: (Risk Index for Overdose or Serious Opioid -induced  Respiratory Depression Risk ) calculated risk in ensuing 6 months  = 83% ( see Problem List for discussion)    Medications: I have reviewed the patient's current medications.  Results for orders placed or performed during the hospital encounter of 05/28/17 (from the past 48 hour(s))  Ammonia     Status: Abnormal   Collection Time: 06/15/17  7:25 PM  Result Value Ref Range   Ammonia 58 (H) 9 - 35 umol/L  Glucose, capillary     Status: Abnormal   Collection Time: 06/15/17  8:07 PM  Result Value Ref Range   Glucose-Capillary 109 (H) 65 - 99 mg/dL  Glucose, capillary     Status: Abnormal   Collection Time: 06/16/17 12:19 AM  Result Value Ref Range   Glucose-Capillary 132 (H) 65 - 99 mg/dL  Glucose, capillary     Status: Abnormal   Collection Time: 06/16/17  4:20 AM  Result Value Ref Range   Glucose-Capillary 127 (H) 65 - 99 mg/dL  Ammonia     Status: Abnormal   Collection Time: 06/16/17  4:25 AM  Result Value Ref Range   Ammonia 38 (H) 9 - 35 umol/L  Comprehensive metabolic panel  Status: Abnormal   Collection Time: 06/16/17  4:25 AM  Result Value Ref Range   Sodium 142 135 - 145 mmol/L   Potassium 3.0 (L) 3.5 - 5.1 mmol/L   Chloride 117 (H) 101 - 111 mmol/L   CO2 22 22 - 32 mmol/L   Glucose, Bld 129 (H) 65 - 99 mg/dL   BUN 29 (H) 6 - 20 mg/dL   Creatinine, Ser 0.77 0.61 - 1.24 mg/dL   Calcium 8.6 (L) 8.9 - 10.3 mg/dL   Total Protein 5.4 (L) 6.5 - 8.1 g/dL   Albumin 2.1 (L) 3.5 - 5.0 g/dL   AST 42 (H) 15 - 41 U/L   ALT 17 17 - 63 U/L   Alkaline Phosphatase 63 38 - 126 U/L   Total Bilirubin 0.7 0.3 - 1.2 mg/dL   GFR calc non Af Amer >60 >60 mL/min   GFR calc Af Amer >60 >60 mL/min    Comment: (NOTE) The eGFR has been calculated using the CKD EPI equation. This calculation has not been validated in all clinical situations. eGFR's persistently <60 mL/min signify possible Chronic Kidney Disease.    Anion gap 3 (L) 5 - 15  Magnesium     Status: None   Collection Time:  06/16/17  4:25 AM  Result Value Ref Range   Magnesium 1.9 1.7 - 2.4 mg/dL  CBC with Differential/Platelet     Status: Abnormal   Collection Time: 06/16/17  4:25 AM  Result Value Ref Range   WBC 7.7 4.0 - 10.5 K/uL   RBC 2.56 (L) 4.22 - 5.81 MIL/uL   Hemoglobin 7.6 (L) 13.0 - 17.0 g/dL   HCT 23.3 (L) 39.0 - 52.0 %   MCV 91.0 78.0 - 100.0 fL   MCH 29.7 26.0 - 34.0 pg   MCHC 32.6 30.0 - 36.0 g/dL   RDW 17.7 (H) 11.5 - 15.5 %   Platelets 164 150 - 400 K/uL   Neutrophils Relative % 77 %   Neutro Abs 6.0 1.7 - 7.7 K/uL   Lymphocytes Relative 9 %   Lymphs Abs 0.7 0.7 - 4.0 K/uL   Monocytes Relative 12 %   Monocytes Absolute 0.9 0.1 - 1.0 K/uL   Eosinophils Relative 2 %   Eosinophils Absolute 0.2 0.0 - 0.7 K/uL   Basophils Relative 0 %   Basophils Absolute 0.0 0.0 - 0.1 K/uL  Glucose, capillary     Status: Abnormal   Collection Time: 06/16/17  7:39 AM  Result Value Ref Range   Glucose-Capillary 128 (H) 65 - 99 mg/dL  Glucose, capillary     Status: Abnormal   Collection Time: 06/16/17 12:11 PM  Result Value Ref Range   Glucose-Capillary 129 (H) 65 - 99 mg/dL  Glucose, capillary     Status: Abnormal   Collection Time: 06/16/17  3:51 PM  Result Value Ref Range   Glucose-Capillary 114 (H) 65 - 99 mg/dL  Glucose, capillary     Status: Abnormal   Collection Time: 06/16/17  7:26 PM  Result Value Ref Range   Glucose-Capillary 115 (H) 65 - 99 mg/dL  Glucose, capillary     Status: Abnormal   Collection Time: 06/16/17 11:45 PM  Result Value Ref Range   Glucose-Capillary 119 (H) 65 - 99 mg/dL  Glucose, capillary     Status: Abnormal   Collection Time: 06/17/17  3:33 AM  Result Value Ref Range   Glucose-Capillary 111 (H) 65 - 99 mg/dL  Basic metabolic panel  Status: Abnormal   Collection Time: 06/17/17  3:57 AM  Result Value Ref Range   Sodium 145 135 - 145 mmol/L   Potassium 3.3 (L) 3.5 - 5.1 mmol/L   Chloride 117 (H) 101 - 111 mmol/L   CO2 22 22 - 32 mmol/L   Glucose, Bld 123  (H) 65 - 99 mg/dL   BUN 31 (H) 6 - 20 mg/dL   Creatinine, Ser 0.78 0.61 - 1.24 mg/dL   Calcium 8.7 (L) 8.9 - 10.3 mg/dL   GFR calc non Af Amer >60 >60 mL/min   GFR calc Af Amer >60 >60 mL/min    Comment: (NOTE) The eGFR has been calculated using the CKD EPI equation. This calculation has not been validated in all clinical situations. eGFR's persistently <60 mL/min signify possible Chronic Kidney Disease.    Anion gap 6 5 - 15  Magnesium     Status: None   Collection Time: 06/17/17  3:57 AM  Result Value Ref Range   Magnesium 1.8 1.7 - 2.4 mg/dL  Glucose, capillary     Status: Abnormal   Collection Time: 06/17/17  7:39 AM  Result Value Ref Range   Glucose-Capillary 108 (H) 65 - 99 mg/dL  CBC with Differential/Platelet     Status: Abnormal   Collection Time: 06/17/17 10:30 AM  Result Value Ref Range   WBC 6.2 4.0 - 10.5 K/uL   RBC 2.47 (L) 4.22 - 5.81 MIL/uL   Hemoglobin 7.4 (L) 13.0 - 17.0 g/dL   HCT 22.7 (L) 39.0 - 52.0 %   MCV 91.9 78.0 - 100.0 fL   MCH 30.0 26.0 - 34.0 pg   MCHC 32.6 30.0 - 36.0 g/dL   RDW 18.2 (H) 11.5 - 15.5 %   Platelets 212 150 - 400 K/uL   Neutrophils Relative % 77 %   Neutro Abs 4.8 1.7 - 7.7 K/uL   Lymphocytes Relative 12 %   Lymphs Abs 0.7 0.7 - 4.0 K/uL   Monocytes Relative 10 %   Monocytes Absolute 0.6 0.1 - 1.0 K/uL   Eosinophils Relative 2 %   Eosinophils Absolute 0.1 0.0 - 0.7 K/uL   Basophils Relative 1 %   Basophils Absolute 0.0 0.0 - 0.1 K/uL  Glucose, capillary     Status: Abnormal   Collection Time: 06/17/17 11:50 AM  Result Value Ref Range   Glucose-Capillary 124 (H) 65 - 99 mg/dL  Magnesium     Status: None   Collection Time: 06/17/17 12:01 PM  Result Value Ref Range   Magnesium 1.8 1.7 - 2.4 mg/dL  Glucose, capillary     Status: None   Collection Time: 06/17/17  3:49 PM  Result Value Ref Range   Glucose-Capillary 98 65 - 99 mg/dL    Dg Chest Port 1 View  Result Date: 06/16/2017 CLINICAL DATA:  Pleural effusion EXAM:  PORTABLE CHEST 1 VIEW COMPARISON:  06/14/2017 FINDINGS: Cardiac shadow is enlarged but stable. Tracheostomy tube, nasogastric catheter and right jugular central line are again seen and stable. Bilateral pleural effusions are again noted and stable. No new focal infiltrate or pneumothorax is seen. No acute bony abnormality is noted. IMPRESSION: Stable bilateral pleural effusions. Tubes and lines stable in appearance. Electronically Signed   By: Inez Catalina M.D.   On: 06/16/2017 07:55    Review of Systems  Unable to perform ROS: Patient nonverbal  Patient is very arousable to sternal rub. This is his inpatient baseline according to RN.  Blood pressure 106/68, pulse Marland Kitchen)  122, temperature 99.3 F (37.4 C), temperature source Axillary, resp. rate (!) 29, height 5' 11"  (1.803 m), weight 93.1 kg (205 lb 4 oz), SpO2 99 %. Physical Exam  Nursing note and vitals reviewed. Constitutional:  Ill appearing man with anasarca  Neck: JVD (Elevated to earlobe) present.  Cardiovascular:  Irregularly irregular rhythm. No significant murmur heard.  Respiratory:  Coarse breath sounds bilaterally. Tracheostomy in place  GI: He exhibits distension. There is no tenderness.  NG tube in place  Musculoskeletal: He exhibits edema (Anasarca).  Right forearm in dressing  Neurological:  Barely arousable to sternal rub. Unable to perform detailed illogical exam.  Skin:  Edematous and shiny   Typing error" Unable to perform detailed neurological exam,  Assessment: Afib w/RVR  Anasarca Encephalopathy Chronic respiratory failure Alcohol dependence H/o CVA, status eplilepticus  Recommendation: QTC often erroneous on computer reading during A. Fib. Okay to continue amiodarone 200 mg daily. Increase metoprolol to 50 mg twice a day per tube. Anasarca and generalized edema likely combination of malnourishment and volume overload. Recommend Lasix 40 mg IV twice a day. Electrolyte replacement as you are doing. Keep K  around 4, Mag around 2. Patient previously on dabigatran given high CHA2DS2VASc score. Defer anticoagulation decision to the primary team. Unable to have this discussion with the patient.  Rest of the management as you are doing.   Nathan Frank J Hemi Chacko 06/17/2017, 5:56 PM   [place], MD [facility] Pager: [phone] Office: [phone] If no answer Cell [phone]

## 2017-06-17 NOTE — NC FL2 (Signed)
[place] LEVEL OF CARE SCREENING TOOL     IDENTIFICATION  Patient Name: Nathan Frank Birthdate: 07-Jul-1941 Sex: male Admission Date (Current Location): 05/28/2017  [facility] and [place] Number:  Herbalist and Address:  [facility] [place], [place] [address] [place], [place] [address]      Provider Number: [id number]  Attending Physician Name and Address:  Nita Sells, MD  Relative Name and Phone Number:       Current Level of Care: Hospital Recommended Level of Care: [place] Prior Approval Number:    Date Approved/Denied:   PASRR Number: [id number]  Discharge Plan: SNF    Current Diagnoses: Patient Active Problem List   Diagnosis Date Noted  . Compromised airway   . Aspiration into airway   . Seizures ([place])   . Acute encephalopathy   . 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 Hepatitis C 05/12/2017  . Atrial fibrillation, chronic ([place]) 05/12/2017  . History of alcohol abuse 05/12/2017  . Hypothyroidism 05/12/2017  . Chronic systolic congestive heart failure, NYHA class 1 ([place]) 05/12/2017  . Heroin abuse 05/12/2017  . Partial seizure disorder ([place]) 05/12/2017  . AKI (acute kidney injury) ([place])   . Demand ischemia ([place])   . NICM (nonischemic cardiomyopathy) ([place])   . Syncope   . Atrial fibrillation with RVR ([place]) 11/12/2016  . ARF (acute renal failure) ([place]) 11/12/2016  . Failure to thrive in adult   . Cellulitis of left knee 08/20/2016  . Back injury 08/20/2016  . Chronic systolic CHF (congestive heart failure) ([place]) 08/20/2016  . Atrial fibrillation with rapid ventricular response ([place]) 08/06/2016  . Acute blood loss anemia   . Lymphocytosis   . Atrial tachycardia ([place])   . Acute ischemic right MCA stroke ([place]) 07/04/2016  . Obesity 07/01/2016  . Family hx-stroke 07/01/2016  . Dysphagia, post-stroke   . Dysarthria,  post-stroke   . Polysubstance abuse   . Chronic combined systolic and diastolic CHF (congestive heart failure) ([place])   . Mitral valve regurgitation   . Anemia of chronic disease   . Cerebral infarction due to embolism of right middle cerebral artery ([place]) - s/p mechanical thrombectomy 06/25/2016  . Acute respiratory failure ([place])   . Acute on chronic diastolic CHF (congestive heart failure) ([place]) 06/09/2016  . History of CVA (cerebrovascular accident) 06/05/2016  . Alcoholic cirrhosis of liver without ascites ([place])   . Other specified hypothyroidism   . Hepatocellular carcinoma ([place])   . Generalized weakness 05/27/2016  . Hypotension 04/18/2016  . Chronic atrial fibrillation ([place]) 04/18/2016  . Prolonged Q-T interval on ECG   . Neuropathy   . Essential hypertension   . Thrombocytopenia ([place]) 04/16/2015  . Abnormal TSH 04/16/2015  . NSTEMI (non-ST elevated myocardial infarction) ([place])   . Acute gastritis with hemorrhage   . GI bleed due to NSAIDs 12/13/2014  . Upper GI bleeding 12/13/2014  . Alcohol dependence ([place]) 02/02/2014  . Depressive disorder 02/01/2014  . Symptomatic cholelithiasis 12/15/2013  . DVT of lower limb, acute ([place]) 06/06/2012  . Alcohol abuse 06/04/2012  . Liver cirrhosis ([place]) 06/04/2012    Orientation RESPIRATION BLADDER Height & Weight        Tracheostomy, Vent, O2 (40% FiO2, 75m shiley cuffed trach) Incontinent, Indwelling catheter Weight: 205 lb 4 oz (93.1 kg) Height:  5' 11" (180.3 cm)  BEHAVIORAL SYMPTOMS/MOOD NEUROLOGICAL BOWEL NUTRITION STATUS      Incontinent  Feeding tube (will have PEG tube at DC)  AMBULATORY STATUS COMMUNICATION OF NEEDS Skin   Total Care Does not communicate Surgical wounds                       Personal Care Assistance Level of Assistance  Bathing, Dressing, Feeding Bathing Assistance: Maximum assistance Feeding assistance: Maximum assistance Dressing Assistance: Maximum assistance     Functional Limitations Info              SPECIAL CARE FACTORS FREQUENCY  PT (By licensed PT), OT (By licensed OT), Speech therapy     PT Frequency: 5/wk OT Frequency: 5/wk     Speech Therapy Frequency: 2/wk      Contractures      Additional Factors Info  Code Status, Allergies Code Status Info: FULL Allergies Info: Nsaids, Oxycodone           Current Medications (06/17/2017):  This is the current hospital active medication list Current Facility-Administered Medications  Medication Dose Route Frequency Provider Last Rate Last Dose  . acetaminophen (TYLENOL) solution 650 mg  650 mg Per Tube Q6H PRN Sood, Vineet, MD   650 mg at 06/16/17 1531  . amiodarone (NEXTERONE PREMIX) 360-4.14 MG/200ML-% (1.8 mg/mL) IV infusion  30 mg/hr Intravenous Continuous Feinstein, Daniel J, MD 16.7 mL/hr at 06/17/17 0915 30 mg/hr at 06/17/17 0915  . artificial tears (LACRILUBE) ophthalmic ointment   Both Eyes Q3H PRN Whiteheart, Kathryn A, NP      . chlorhexidine gluconate (MEDLINE KIT) (PERIDEX) 0.12 % solution 15 mL  15 mL Mouth Rinse BID Sood, Vineet, MD   15 mL at 06/16/17 2025  . Chlorhexidine Gluconate Cloth 2 % PADS 6 each  6 each Topical Daily Sood, Vineet, MD   6 each at 06/16/17 1000  . feeding supplement (VITAL AF 1.2 CAL) liquid 1,000 mL  1,000 mL Per Tube Continuous Silva Zapata, Edwin, MD 70 mL/hr at 06/16/17 2119 1,000 mL at 06/16/17 2119  . fentaNYL (SUBLIMAZE) injection 50 mcg  50 mcg Intravenous Q2H PRN Sood, Vineet, MD      . folic acid (FOLVITE) tablet 1 mg  1 mg Per Tube Daily Silva Zapata, Edwin, MD   1 mg at 06/17/17 0933  . furosemide (LASIX) injection 40 mg  40 mg Intravenous Q12H Samtani, Jai-Gurmukh, MD   40 mg at 06/17/17 0006  . lacosamide (VIMPAT) 50 mg in sodium chloride 0.9 % 25 mL IVPB  50 mg Intravenous Q12H Arora, Ashish, MD 60 mL/hr at 06/17/17 1003 50 mg at 06/17/17 1003  . lactulose (CHRONULAC) 10 GM/15ML solution 30 g  30 g Oral TID Arrien, Mauricio Daniel, MD   30 g at 06/17/17 0941  .  levETIRAcetam (KEPPRA) 1,000 mg in sodium chloride 0.9 % 100 mL IVPB  1,000 mg Intravenous Q12H Arora, Ashish, MD 440 mL/hr at 06/17/17 1006 1,000 mg at 06/17/17 1006  . levothyroxine (SYNTHROID, LEVOTHROID) tablet 50 mcg  50 mcg Per Tube QAC breakfast Silva Zapata, Edwin, MD   50 mcg at 06/17/17 0933  . MEDLINE mouth rinse  15 mL Mouth Rinse QID Silva Zapata, Edwin, MD   15 mL at 06/17/17 0359  . metoprolol tartrate (LOPRESSOR) injection 2.5-5 mg  2.5-5 mg Intravenous Q4H PRN Deterding, Elizabeth C, MD   5 mg at 06/11/17 0845  . metoprolol tartrate (LOPRESSOR) tablet 25 mg  25 mg Per NG tube TID Arrien, Mauricio Daniel, MD   25 mg at 06/17/17 0933  . midazolam (  VERSED) injection 1 mg  1 mg Intravenous Q2H PRN [name], MD      . ondansetron [facility]) injection 4 mg  4 mg Intravenous Q6H PRN Etta Quill, DO   4 mg at 06/03/17 1548  . pantoprazole sodium (PROTONIX) 40 mg/20 mL oral suspension 40 mg  40 mg Per Tube Daily Patrecia Pour, Christean Grief, MD   40 mg at 06/17/17 0941  . phenytoin (DILANTIN) injection 100 mg  100 mg Intravenous Q12H Arrien, Jimmy Picket, MD   100 mg at 06/17/17 0957  . potassium chloride (KLOR-CON) packet 40 mEq  40 mEq Oral TID Nita Sells, MD   40 mEq at 06/17/17 0939  . pregabalin (LYRICA) capsule 75 mg  75 mg Per Tube BID Amie Portland, MD   75 mg at 06/17/17 0940  . scopolamine (TRANSDERM-SCOP) 1 MG/3DAYS 1.5 mg  1 patch Transdermal Q72H Patrecia Pour, Christean Grief, MD   1.5 mg at 06/15/17 6701  . sodium chloride flush (NS) 0.9 % injection 10-40 mL  10-40 mL Intracatheter Q12H [name], MD   10 mL at 06/17/17 1024  . sodium chloride flush (NS) 0.9 % injection 10-40 mL  10-40 mL Intracatheter PRN [name], MD      . sulfamethoxazole-trimethoprim (BACTRIM DS,SEPTRA DS) 800-160 MG per tablet 1 tablet  1 tablet Per Tube Q12H Norva Riffle, RPH   1 tablet at 06/17/17 0933  . thiamine (VITAMIN B-1) tablet 100 mg  100 mg Per Tube Daily Patrecia Pour, Christean Grief, MD    100 mg at 06/17/17 4103     Discharge Medications: Please see discharge summary for a list of discharge medications.  Relevant Imaging Results:  Relevant Lab Results:   Additional Information SS#: [id number]  Jorge Ny, LCSW

## 2017-06-17 NOTE — Progress Notes (Signed)
[place] NOTE  Pharmacy Consult for Phenytoin Indication: Seizures  Allergies  Allergen Reactions  . Nsaids Other (See Comments)    Acute gastric ulcers  . Oxycodone Other (See Comments)    Per the risk calculating tool RIOSORD: (Risk Index for Overdose or Serious Opioid -induced Respiratory Depression Risk ) calculated risk in ensuing 6 months  = 83% ( see Problem List for discussion)    Patient Measurements: Height: 5\' 11"  (180.3 cm) Weight: 205 lb 4 oz (93.1 kg) IBW/kg (Calculated) : 75.3   Vital Signs: Temp: 98.2 F (36.8 C) (09/13 0740) Temp Source: Axillary (09/13 0740) BP: 104/65 (09/13 0740) Pulse Rate: 126 (09/13 0740) Intake/Output from previous day: 09/12 0701 - 09/13 0700 In: 2922.7 [I.V.:822.7; NG/GT:1820; IV Piggyback:280] Out: 5400 [Urine:3100; GNFAO:1308] Intake/Output from this shift: No intake/output data recorded.  Labs:  Recent Labs  06/15/17 0524 06/16/17 0425 06/17/17 0357  WBC  --  7.7  --   HGB  --  7.6*  --   HCT  --  23.3*  --   PLT  --  164  --   CREATININE 0.79 0.77 0.78  MG 1.7 1.9 1.8  ALBUMIN  --  2.1*  --   PROT  --  5.4*  --   AST  --  42*  --   ALT  --  17  --   ALKPHOS  --  63  --   BILITOT  --  0.7  --    Estimated Creatinine Clearance: 91.6 mL/min (by C-G formula based on SCr of 0.78 mg/dL).  Medical History: Past Medical History:  Diagnosis Date  . Abnormal TSH   . Acute gastric ulcer   . Acute gastritis with hemorrhage   . Alcohol abuse   . Alcohol dependence ([place]) 02/02/2014  . Anemia   . Bifascicular block   . Cirrhosis ([place]) 06/04/2012  . Depressive disorder 02/01/2014  . Dizziness and giddiness 12/13/2014  . DVT of lower limb, acute ([place]) 06/06/2012  . DVT, lower extremity, recurrent ([place])    a. noted 2013. b. also dx 06/2016.  Marland Kitchen Essential hypertension   . Fatty liver   . Gallstones   . Gastritis 06/07/2012  . GERD (gastroesophageal reflux disease)   . GI bleed due to NSAIDs 12/13/2014  .  Granulomatous gastritis   . Hematuria 06/04/2012  . Hepatitis C   . Hepatocellular carcinoma ([place])   . Heroin abuse    "I haven't done that since I don't know when."  . Heroin overdose 02/20/2014  . Neuropathy   . NICM (nonischemic cardiomyopathy) ([place])    a. 04/2015: EF 45-50% by cath. b. EF 40-45% by echo 06/2016.  . NSTEMI (non-ST elevated myocardial infarction) ([place])    a. 04/2015 - patent coronaries. Etiology possibly due to coronary spasm versus embolus, stress cardiomyopathy (atypical), and aborted infarction related to plaque rupture with thrombosis and dissolution. Amlodipine started. Not on antiplatelets due to GIB/cirrhosis history.  . Oral thrush 06/05/2012  . Polysubstance abuse    THC, alcohol, heroin  . Prolonged Q-T interval on ECG    a. 12/2014 - treated with magnesium.  . Right knee pain 12/13/2014  . S/P alcohol detoxification 02/02/2014  . Stroke ([place]) 06/2016  . SVT (supraventricular tachycardia) ([place])    a. 12/2014 in setting of GIB, ETOH, NSAIDS, gastritis.  . Symptomatic cholelithiasis 12/15/2013  . Thrombocytopenia ([place])   . Upper GI bleeding 12/13/2014  . Weight loss 06/04/2012   Assessment: 75yom started on  phenytoin 100mg  IV q8 for seizures on 8/28. Level drawn on 9/7 was supratherapeutic and dose reduced to 100mg  IV q12. Doses then held 9/9 due to patient being unresponsive (last dose 9/9 @ 0938) and subsequently resumed at lower dose on 100 mg IV BID on 9/10. No new seizures noted and AED regimen consist of Dilantin, Keppra and Vimpat.   Goal of Therapy:  Total phenytoin level 15-20  Plan:  1) Continue phenytoin 100mg  IV q12 2) Dilantin level and albumin level in am   Vincenza Hews, PharmD, BCPS 06/17/2017, 8:55 AM

## 2017-06-17 NOTE — Progress Notes (Signed)
PROGRESS NOTE [facility]   Nathan Frank   UYQ:[id number] DOB: 1941-04-27  DOA: 05/28/2017 PCP: Thressa Sheller, MD   Brief Narrative:  81 hepatitis C,  alcohol abuse with cirrhosis,  alcoholic cardiomyopathy and recently diagnosed with seizures  Presented to the hospital with weakness and left arm tremor he was admitted for suspected seizures to the medical floor on 8/24  Subsequently patient on 8/27 developed status epilepticus and altered mental status for which he required intubation for irway protection and was transferred to the ICU- elevated ammonia levels. Neurology was consulted, burst suppression was started on 8/28 which lasted for over 3 days.  At one point patient was hypotensive required, pressor for a short period.  Patient has been unable to be weaned off ventilator, tracheostomy was placed on 06/09/2017. Despite all interventions patient remains unresponsive. Given poor prognosis palliative care was consulted. MRI of brain and EEG was repeated consistent with seizures. Patient developed cellulitis of the neck been treated with bactrim   Subjective:  Patient remains unresponsive even to menance and firm pressure, tolerating goal tube feeding No changes overnight  Assessment & Plan:  Status epilepticus - status post burst suppression -marginally more responsive but not conversant--waxing and waning mentation Neurology following, AEDS per their recommendation Off sedation Continue Vimpat 50 q 12, Keppra 1000 q12 and phenytoin 100 q 12.  Phenytoin level noted - 7.7 on 9/10 Corrected level is 14 based on albumin Unclear if we will meaningfully recover?  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  TCP 112--->164--would defer this till mental status improves a little and will need to discuss implications with family   His QTC is prolonged into the low 500 range and I will get magnesium today given diarrhea  The patient is able to get medications via tube and we may consider changing over to digoxin  Reviewed Amio and interaction with AED meds--NO real QT prolonging potential  Have d/c zofran, fentanyl and Versed   Anemia & Thrombocytopenia - likely for cirrhosis and acute illness  Hemoglobin is now 7.6, Normocytic NO dark stools [has flexiseal]  Hypokalemia/Hypomagnesemia  Continue K packets 40 tid for now--recheck in am Get Mag in am   Neck cellulitis  Trach site with mild erythema, and some purulence. Patient has been started on Bactrim which may need to be changes in light of AKI Follow up closely if worse will get [place] to r/o abscess  Goals of care  Patient remains full code, eventually will  need PEG tube? Will talk to family  DVT prophylaxis: SCD's  Code Status: FULL  Family Communication: None at bedside --is full code and hopeful for recovewhich I anticipate will be very very slow--I called son on phone 9/13 but no answer.  [place] meeting with  palliative has been set Neurologist and I spoke on 9/13 and there is a very slim chance of recovery.  We will watch for this over next couple of days and discuss further with family disposition Plan: TBD --he is not a candidate for long-term acute care and will be need skilled facility placement   Consultants:   PCCM   Neurology   Procedures:   Rt IJ 8/28>>  Trach 9/5   Vent Mode: CPAP;PRVC FiO2 (%):  [40 %] 40 % Set Rate:  [16 bmp] 16 bmp Vt Set:  [600 mL] 600 mL PEEP:  [5 cmH20] 5 cmH20 Pressure Support:  [10 cmH20-15 cmH20] 10 cmH20 Plateau Pressure:  [20 cmH20-21 cmH20] 20 cmH20   Antimicrobials: Anti-infectives    Start     Dose/Rate Route Frequency Ordered Stop   06/15/17 2200  sulfamethoxazole-trimethoprim (BACTRIM DS,SEPTRA DS) 800-160 MG per tablet 1 tablet     1 tablet Per Tube Every 12 hours 06/15/17 1052     06/13/17 1000  sulfamethoxazole-trimethoprim (BACTRIM,SEPTRA) 400-80 MG per tablet 1 tablet  Status:  Discontinued     1 tablet Oral Every 12 hours 06/13/17 0857 06/15/17 1052      Objective: Vitals:   06/17/17 0733 06/17/17 0740 06/17/17 1000 06/17/17 1151  BP: 104/65 104/65 100/67 100/72  Pulse: (!) 126 (!) 126 (!) 121 100  Resp: 11 17 18  (!) 22  Temp:  98.2 F (36.8 C)  98 F (36.7 C)  TempSrc:  Axillary  Axillary  SpO2: 100% 100% 100% 100%  Weight:      Height:        Intake/Output Summary (Last 24 hours) at 06/17/17 1243 Last data filed at 06/17/17 0930  Gross per 24 hour  Intake           1717.1 ml  Output             4000 ml  Net          -2282.9 ml   Filed Weights   06/15/17 0319 06/16/17 0300 06/17/17 0402  Weight: 91.9 kg (202 lb 9.6 oz) 93 kg (205 lb 0.4 oz) 93.1 kg (205 lb 4 oz)    Examination:  General: Not responsive, deconditioned --Arcus senilis +, Catracts-poorly responsive pupils EENT: Trach in place, mild erythema surrounding trach, moist area around ostomy Cardiovascular: Irr Irr, W0J8, + systolic murmur , rate  119'J Respiratory: Good air entry, no rales or crackles, on vent  Abdominal: Soft, NT, ND Neuro: Not responsive to verbal, tactile and painful stimuli.  Extremities: anasarca-swelling to arms as well Skin: No rash, erythema on neck   Data Reviewed: I have personally reviewed following labs and imaging studies  CBC:  Recent Labs Lab 06/11/17 1330 06/12/17 1009 06/13/17 0230 06/16/17 0425 06/17/17 1030  WBC 12.1* 9.4 10.4 7.7 6.2  NEUTROABS  --  6.8  --  6.0 4.8  HGB 9.4* 8.9* 8.4* 7.6* 7.4*  HCT 27.5* 26.3* 24.5* 23.3* 22.7*  MCV 88.7 88.3 88.1 91.0 91.9  PLT 135* 126* 112* 164 478   Basic Metabolic Panel:  Recent Labs Lab 06/11/17 0536  06/13/17 0230 06/14/17 0500 06/15/17 0524 06/16/17 0425 06/17/17 0357 06/17/17 1201  NA 138  < >  136 140 140 142 145  --   K 2.6*  < > 3.0* 3.5 2.8* 3.0* 3.3*  --   CL 109  < > 107 110 112* 117* 117*  --   CO2 22  < > 24 24 22 22 22   --   GLUCOSE 138*  < > 146* 131* 128* 129* 123*  --   BUN 13  < > 22* 23* 27* 29* 31*  --   CREATININE 0.61  < > 0.84 0.73 0.79 0.77 0.78  --   CALCIUM 8.1*  < > 8.1* 8.5* 8.6* 8.6* 8.7*  --   MG 1.4*  < > 2.0  --  1.7 1.9 1.8 1.8  PHOS 2.5  --  2.1*  --   --   --   --   --   < > = values in this interval not displayed. GFR: Estimated Creatinine Clearance: 91.6 mL/min (by C-G formula based on SCr of 0.78 mg/dL). Liver Function Tests:  Recent Labs Lab 06/12/17 1009 06/13/17 0230 06/13/17 2150 06/16/17 0425  AST 66* 57*  --  42*  ALT 21 20  --  17  ALKPHOS 61 65  --  63  BILITOT 0.7 0.8  --  0.7  PROT 5.0* 5.3*  --  5.4*  ALBUMIN 1.5* 1.6* 1.6* 2.1*   No results for input(s): LIPASE, AMYLASE in the last 168 hours.  Recent Labs Lab 06/12/17 1009 06/13/17 0230 06/14/17 0521 06/15/17 1925 06/16/17 0425  AMMONIA 71* 89* 70* 58* 38*   Coagulation Profile: No results for input(s): INR, PROTIME in the last 168 hours. Cardiac Enzymes: No results for input(s): CKTOTAL, CKMB, CKMBINDEX,  TROPONINI in the last 168 hours. BNP (last 3 results) No results for input(s): PROBNP in the last 8760 hours. HbA1C: No results for input(s): HGBA1C in the last 72 hours. CBG:  Recent Labs Lab 06/16/17 1926 06/16/17 2345 06/17/17 0333 06/17/17 0739 06/17/17 1150  GLUCAP 115* 119* 111* 108* 124*   Lipid Profile: No results for input(s): CHOL, HDL, LDLCALC, TRIG, CHOLHDL, LDLDIRECT in the last 72 hours. Thyroid Function Tests: No results for input(s): TSH, T4TOTAL, FREET4, T3FREE, THYROIDAB in the last 72 hours. Anemia Panel: No results for input(s): VITAMINB12, FOLATE, FERRITIN, TIBC, IRON, RETICCTPCT in the last 72 hours. Sepsis Labs: No results for input(s): PROCALCITON, LATICACIDVEN in the last 168 hours.  No results found for this or any previous visit (from the past 240 hour(s)).    Radiology Studies: Dg Chest Port 1 View  Result Date: 06/16/2017 CLINICAL DATA:  Pleural effusion EXAM: PORTABLE CHEST 1 VIEW COMPARISON:  06/14/2017 FINDINGS: Cardiac shadow is enlarged but stable. Tracheostomy tube, nasogastric catheter and right jugular central line are again seen and stable. Bilateral pleural effusions are again noted and stable. No new focal infiltrate or pneumothorax is seen. No acute bony abnormality is noted. IMPRESSION: Stable bilateral pleural effusions. Tubes and lines stable in appearance. Electronically Signed   By: Inez Catalina M.D.   On: 06/16/2017 07:55      Scheduled Meds: . amiodarone  400 mg Oral BID  . chlorhexidine gluconate (MEDLINE KIT)  15 mL Mouth Rinse BID  . Chlorhexidine Gluconate Cloth  6 each Topical Daily  . folic acid  1 mg Per Tube Daily  . lactulose  30 g Oral TID  . levothyroxine  50 mcg Per Tube QAC breakfast  . mouth rinse  15 mL Mouth Rinse QID  . metoprolol tartrate  25 mg Per NG tube  TID  . pantoprazole sodium  40 mg Per Tube Daily  . phenytoin (DILANTIN) IV  100 mg Intravenous Q12H  . potassium chloride  40 mEq Oral TID  .  pregabalin  75 mg Per Tube BID  . scopolamine  1 patch Transdermal Q72H  . sodium chloride flush  10-40 mL Intracatheter Q12H  . sulfamethoxazole-trimethoprim  1 tablet Per Tube Q12H  . thiamine  100 mg Per Tube Daily   Continuous Infusions: . feeding supplement (VITAL AF 1.2 CAL) 1,000 mL (06/16/17 2119)  . lacosamide (VIMPAT) IV Stopped (06/17/17 1033)  . levETIRAcetam Stopped (06/17/17 1200)     LOS: 19 days    Nathan Griffes, MD [facility] [phone]

## 2017-06-18 ENCOUNTER — Inpatient Hospital Stay (HOSPITAL_COMMUNITY): Payer: Medicare Other

## 2017-06-18 LAB — GLUCOSE, CAPILLARY
GLUCOSE-CAPILLARY: 112 mg/dL — AB (ref 65–99)
GLUCOSE-CAPILLARY: 116 mg/dL — AB (ref 65–99)
GLUCOSE-CAPILLARY: 117 mg/dL — AB (ref 65–99)
GLUCOSE-CAPILLARY: 99 mg/dL (ref 65–99)
Glucose-Capillary: 102 mg/dL — ABNORMAL HIGH (ref 65–99)
Glucose-Capillary: 112 mg/dL — ABNORMAL HIGH (ref 65–99)
Glucose-Capillary: 123 mg/dL — ABNORMAL HIGH (ref 65–99)

## 2017-06-18 LAB — MAGNESIUM: Magnesium: 2 mg/dL (ref 1.7–2.4)

## 2017-06-18 LAB — BASIC METABOLIC PANEL
Anion gap: 4 — ABNORMAL LOW (ref 5–15)
BUN: 32 mg/dL — AB (ref 6–20)
CALCIUM: 8.7 mg/dL — AB (ref 8.9–10.3)
CO2: 22 mmol/L (ref 22–32)
CREATININE: 0.77 mg/dL (ref 0.61–1.24)
Chloride: 123 mmol/L — ABNORMAL HIGH (ref 101–111)
GFR calc Af Amer: >60 mL/min (ref >60)
GLUCOSE: 131 mg/dL — AB (ref 65–99)
Potassium: 3.9 mmol/L (ref 3.5–5.1)
Sodium: 149 mmol/L — ABNORMAL HIGH (ref 135–145)

## 2017-06-18 LAB — PHENYTOIN LEVEL, TOTAL: Phenytoin Lvl: 4.7 ug/mL — ABNORMAL LOW (ref 10.0–20.0)

## 2017-06-18 LAB — ALBUMIN: Albumin: 2 g/dL — ABNORMAL LOW (ref 3.5–5.0)

## 2017-06-18 MED ORDER — LACTULOSE 10 GM/15ML PO SOLN
30.0000 g | Freq: Two times a day (BID) | ORAL | Status: DC
  Administered 2017-06-18 – 2017-06-19 (×4): 30 g via ORAL
  Filled 2017-06-18 (×4): qty 45

## 2017-06-18 MED ORDER — FREE WATER
200.0000 mL | Freq: Three times a day (TID) | Status: DC
  Administered 2017-06-18 – 2017-06-19 (×4): 200 mL

## 2017-06-18 NOTE — Progress Notes (Signed)
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  Intubated for airway.  Failed weaning and required trach.  Hx of ETOH and Hep C with cirrhosis, heroin abuse, hepatic encephalopathy, GIB, thrombocytopenia, non ischemic CM.  Subjective: On PSV , Has failed 2 attempts of PS this am due to apnea, ( Weaned for 14 hours daily last 2 days). Not overbreathing the vent.  Vital signs: BP 120/83   Pulse (!) 119   Temp 98 F (36.7 C) (Oral)   Resp 20   Ht 5\' 11"  (1.803 m)   Wt 202 lb 2.6 oz (91.7 kg)   SpO2 100%   BMI 28.20 kg/m   Intake/output:  Intake/Output Summary (Last 24 hours) at 06/18/17 1058 Last data filed at 06/18/17 1008  Gross per 24 hour  Intake          2538.01 ml  Output             4260 ml  Net         -1721.99 ml     General appearance:  Chronically ill appearing 76 Year old  Malecachectic  NAD, failed 2 attempts at weaning 9/14 due to apnea Eyes: anicteric sclerae, moist conjunctivae; PERRL, EOMI bilaterally. Mouth: moist membranes and no mucosal ulcerations; normal hard and soft palate Neck: Trachea midline; neck supple, no JVD, the trach site remains erythremic, no drainage noted .  Lungs/chest: CTA, not assisting vent,requiring full support 9/14 CV: S1, S2,  RRR, no MRGs  Abdomen: Soft, non-tender; non-distended , BS + Extremities: No peripheral edema or extremity lymphadenopathy, brisk capillary refill Skin: Normal temperature, turgor and texture; no rash, ulcers or subcutaneous nodules, no lesions Psych: Pt. Is non-interactive, eyes open, non-focus, non-track   CMP Latest Ref Rng & Units 06/18/2017 06/17/2017 06/16/2017  Glucose 65 - 99 mg/dL 131(H) 123(H) 129(H)  BUN 6 - 20 mg/dL 32(H) 31(H) 29(H)  Creatinine 0.61 - 1.24 mg/dL 0.77 0.78 0.77  Sodium 135 - 145 mmol/L 149(H) 145 142  Potassium 3.5 - 5.1 mmol/L 3.9 3.3(L) 3.0(L)  Chloride 101 - 111  mmol/L 123(H) 117(H) 117(H)  CO2 22 - 32 mmol/L 22 22 22   Calcium 8.9 - 10.3 mg/dL 8.7(L) 8.7(L) 8.6(L)  Total Protein 6.5 - 8.1 g/dL - - 5.4(L)  Total Bilirubin 0.3 - 1.2 mg/dL - - 0.7  Alkaline Phos 38 - 126 U/L - - 63  AST 15 - 41 U/L - - 42(H)  ALT 17 - 63 U/L - - 17    CBC Latest Ref Rng & Units 06/17/2017 06/16/2017 06/13/2017  WBC 4.0 - 10.5 K/uL 6.2 7.7 10.4  Hemoglobin 13.0 - 17.0 g/dL 7.4(L) 7.6(L) 8.4(L)  Hematocrit 39.0 - 52.0 % 22.7(L) 23.3(L) 24.5(L)  Platelets 150 - 400 K/uL 212 164 112(L)    ABG    Component Value Date/Time   PHART 7.397 06/09/2017 0342   PCO2ART 27.2 (L) 06/09/2017 0342   PO2ART 71.0 (L) 06/09/2017 0342   HCO3 16.9 (L) 06/09/2017 0342   TCO2 18 (L) 06/09/2017 0342   ACIDBASEDEF 7.0 (H) 06/09/2017 0342   O2SAT 95.0 06/09/2017 0342    CBG (last 3)   Recent Labs  06/18/17 0026 06/18/17 0445 06/18/17 0908  GLUCAP 123* 112* 102*     Dg Chest Port 1 View  Result Date: 06/18/2017 CLINICAL DATA:  Pneumonia. EXAM: PORTABLE CHEST 1 VIEW COMPARISON:  June 16, 2017. FINDINGS:  Unchanged positioning of the tracheostomy tube, enteric tube, and right internal jugular central venous catheter. Stable cardiomegaly. Small bilateral pleural effusions, possibly slightly improved. Bibasilar atelectasis. No pneumothorax. No acute osseous abnormality. Several healed rib fractures are again noted. IMPRESSION: Small bilateral pleural effusions, possibly slightly improved when compared to prior study. Electronically Signed   By: Titus Dubin M.D.   On: 06/18/2017 08:57     Lines/tubes: Lurline Idol 9/05 (JY) >> R IJ CBC 8/28>>   Assessment/plan:  Stroke Encephalopathy  Cirrhosis  Hep C H/o CVA afib  Seizures (status) Compromised airway. Failure to wean s/p tracheostomy. 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 Evolving abscess  Pulm problem list  Trach/vent dependent w/ failure to wean in setting of volume overload,  encephalopathy, protein calorie malnutrition and severe deconditioning.  CXR with slight improvement in bilateral small pleural effusions Apnea 9/14 with weaning trials   -he is NOT a candidate for decannulation -hope we can get improvement in  volume status remains barrier to coming off vent but his mental status will prevent decannulation - Concern that inability to wean ( Apnea) after successful weaning last 2 days may indicate worsening of neuro status   Interval hx from 9/6 to 9/10 Seen by palliative; patients family indicated that they want all aggressive therapy done including PEG tube.  Was in burst suppression for > 3 d that required several sedating meds and anti-convulsants. This is likely complicating MS.  Now more awake but not interactive  PCXR personally reviewed: trach good position. Bilateral effusions/atx on 9/10 9/11 weaning. Neck cellulitis/abscess draining purulent fluid. Less swollen; still erythremic but over all looks better 9/14>> Unable to wean 2/2 apnea. CXR with slight improvement in small bilateral effusions Plan/rec Cont PSV as tolerated Continue Lasix daily as ordered per primary team Repeat cxr 9/15  Cont bactrim for neck cellulitis/abscess. ( Low grade fever 99.3/ WBC WNL)  -all other care per med service  - Low likelihood of liberation from mechanical ventilation 2/2  mental status  Magdalen Spatz, AGACNP-BC [place] Pager  # [phone]

## 2017-06-18 NOTE — Progress Notes (Signed)
PROGRESS NOTE [facility]   Nathan Frank   PPJ:[id number] DOB: 08/27/1941  DOA: 05/28/2017 PCP: Thressa Sheller, MD   Brief Narrative:  60 hepatitis C,  alcohol abuse with cirrhosis,  alcoholic cardiomyopathy and recently diagnosed with seizures  Presented to the hospital with weakness and left arm tremor he was admitted for suspected seizures to the medical floor on 8/24  Subsequently patient on 8/27 developed status epilepticus and altered mental status for which he required intubation fora irway protection and was transferred to the ICU- elevated ammonia levels.  Neurology was consulted, burst suppression was started on 8/28 which lasted for over 3 days.  At one point patient was hypotensive required, pressor for a short period.   Patient has been unable to be weaned off ventilator, tracheostomy was placed on 06/09/2017. Given poor prognosis palliative care was consulted.  MRI of brain and EEG was repeated consistent with seizures.  Patient developed cellulitis of the neck been treated with bactrim   Subjective:  slightly more awake but no meaningful response. He is overall swollen Had to be put back on PRVC this am-had been able to stay on pressure support most of yesterday X-ray done this morning and not significantly changed  Assessment & Plan:  Status epilepticus - status post burst suppression -marginally more responsive but not conversant--waxing and waning mentation Neurology following, AEDS per their recommendation Off sedation Continue Vimpat 50 q 12, Keppra 1000 q12 and phenytoin 100 q 12.  Phenytoin level noted - 4.7 correcting to~ 7.4 9/14  Hepatic encephalopathy  Hx Hep C with ETOH cirrhosis  Ammonia levels trending down - continue lactulose via NGT  Ammonia levels have dropped to 38 since admit--have cut the dose of lactulose 30 iii/day -- 20 ii/d 9/13  Anasarca  Albumin low  Lasix has been on hold since 9/12 As negative balance of 4.6  L Chest XR 9/12 STABLE EFFUSIONS, rpt cxr 9/14 no sig change Placed TED hose on 9/13  Chronic respiratory failure due to encephalopathy -  s/p trach 9/5  Continue scopolamine for secretions  Patient continues to fail spontaneous breathing trials--have discussed this with family as per noted 5/80  Alcoholic cardiomyopathy/Afib with RVR - HR stable  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, diffuse hypokinesis  Initially was hypotensive treated with pressor - now weaned off  Remains Amio gtt for intermittent Afib with RVR --transitioning to by mouth amiodarone 400 twice a day  On aspirin for anticoagulation for now, doubtful good candidate for NOAC  TCP 112--->164  Cardiology saw the patient and felt reasonable to keep amiodarone 200 daily and increased metoprolol 50 twice a day-input appreciated   Anemia of critical illness & Thrombocytopenia - likely for cirrhosis and acute illness  Hemoglobin is now 7.6, Normocytic No dark stools [has flexiseal] Repeat CBC a.m.  Hypokalemia/Hypomagnesemia  Hypernatremia Continue K packets 40 tid for now--recheck in am Get Mag in am  Adding free water 200 q 8  Neck cellulitis  Trach site with mild erythema, and some purulence  Patient has been started on Bactrim which may need to be changes in light of AKI Follow up closely if worse will get [place] to r/o abscess  Goals of care  Patient remains full code, eventually will need PEG tube? Will talk to family  DVT prophylaxis: SCD's  Code Status: FULL  Family Communication: None at bedside --see discussion from my note earlier today 9/14 disposition Plan: TBD --he is not a candidate for long-term acute care and  will be need skilled facility placement   Consultants:   PCCM   Neurology   Procedures:   Rt IJ 8/28>>  Trach 9/5   Vent Mode: PRVC FiO2 (%):  [35 %-40 %] 35 % Set Rate:  [16 bmp] 16 bmp Vt Set:  [600 mL] 600 mL PEEP:  [5 cmH20] 5 cmH20 Pressure Support:  [10 cmH20] 10  cmH20 Plateau Pressure:  [18 cmH20-20 cmH20] 18 cmH20   Antimicrobials: Anti-infectives    Start     Dose/Rate Route Frequency Ordered Stop   06/15/17 2200  sulfamethoxazole-trimethoprim (BACTRIM DS,SEPTRA DS) 800-160 MG per tablet 1 tablet     1 tablet Per Tube Every 12 hours 06/15/17 1052     06/13/17 1000  sulfamethoxazole-trimethoprim (BACTRIM,SEPTRA) 400-80 MG per tablet 1 tablet  Status:  Discontinued     1 tablet Oral Every 12 hours 06/13/17 0857 06/15/17 1052      Objective: Vitals:   06/18/17 0905 06/18/17 1000 06/18/17 1126 06/18/17 1234  BP: 102/71 1_0  Pulse: (!) 114 (!) 119 (!) 120 (!) 120  Resp: _1 Temp: 98 F (36.7 C)   98.8 F (37.1 C)  TempSrc: Oral   Axillary  SpO2: 100% 100% 100% 100%  Weight:      Height:        Intake/Output Summary (Last 24 hours) at 06/18/17 1327 Last data filed at 06/18/17 1030  Gross per 24 hour  Intake          2548.01 ml  Output             4260 ml  Net         -1711.99 ml   Filed Weights   06/16/17 0300 06/17/17 0402 06/18/17 0351  Weight: 93 kg (205 lb 0.4 oz) 93.1 kg (205 lb 4 oz) 91.7 kg (202 lb 2.6 oz)    Examination:  General: Not responsive, deconditioned --Arcus senilis +, Catracts-poorly responsive pupils EENT: Trach in place, mild erythema However no gross purulence  Cardiovascular: Irr Irr, X8V2, + systolic murmur , rate 919'T QTC not concurrent with actual EKG Respiratory: Good air entry, no rales or crackles, on vent  Abdominal: Soft, NT, ND Neuro: Not responsive to verbal, tactile and painful stimuli.  Extremities: anasarca-swelling to arms as well Skin: No rash to lower extremities but toxin not checked  Data Reviewed: I have personally reviewed following labs and imaging studies  CBC:  Recent Labs Lab 06/12/17 1009 06/13/17 0230 06/16/17 0425 06/17/17 1030  WBC 9.4 10.4 7.7 6.2  NEUTROABS 6.8  --  6.0 4.8  HGB 8.9* 8.4* 7.6* 7.4*  HCT 26.3* 24.5* 23.3* 22.7*  MCV 88.3  88.1 91.0 91.9  PLT 126* 112* 164 660   Basic Metabolic Panel:  Recent Labs Lab 06/13/17 0230 06/14/17 0500 06/15/17 0524 06/16/17 0425 06/17/17 0357 06/17/17 1201 06/18/17 0346 06/18/17 0900  NA 136 140 140 142 145  --  149*  --   K 3.0* 3.5 2.8* 3.0* 3.3*  --  3.9  --   CL 107 110 112* 117* 117*  --  123*  --   CO2 _2 --  22  --   GLUCOSE 146* 131* 128* 129* 123*  --  131*  --   BUN 22* 23* 27* 29* 31*  --  32*  --   CREATININE 0.84 0.73 0.79 0.77 0.78  --  0.77  --   CALCIUM 8.1* 8.5* 8.6* 8.6*  8.7*  --  8.7*  --   MG 2.0  --  1.7 1.9 1.8 1.8  --  2.0  PHOS 2.1*  --   --   --   --   --   --   --    GFR: Estimated Creatinine Clearance: 91 mL/min (by C-G formula based on SCr of 0.77 mg/dL). Liver Function Tests:  Recent Labs Lab 06/12/17 1009 06/13/17 0230 06/13/17 2150 06/16/17 0425 06/18/17 0346  AST 66* 57*  --  42*  --   ALT 21 20  --  17  --   ALKPHOS 61 65  --  63  --   BILITOT 0.7 0.8  --  0.7  --   PROT 5.0* 5.3*  --  5.4*  --   ALBUMIN 1.5* 1.6* 1.6* 2.1* 2.0*   No results for input(s): LIPASE, AMYLASE in the last 168 hours.  Recent Labs Lab 06/12/17 1009 06/13/17 0230 06/14/17 0521 06/15/17 1925 06/16/17 0425  AMMONIA 71* 89* 70* 58* 38*   Coagulation Profile: No results for input(s): INR, PROTIME in the last 168 hours. Cardiac Enzymes: No results for input(s): CKTOTAL, CKMB, CKMBINDEX, TROPONINI in the last 168 hours. BNP (last 3 results) No results for input(s): PROBNP in the last 8760 hours. HbA1C: No results for input(s): HGBA1C in the last 72 hours. CBG:  Recent Labs Lab 06/17/17 1549 06/17/17 2001 06/18/17 0026 06/18/17 0445 06/18/17 0908  GLUCAP 98 117* 123* 112* 102*   Lipid Profile: No results for input(s): CHOL, HDL, LDLCALC, TRIG, CHOLHDL, LDLDIRECT in the last 72 hours. Thyroid Function Tests: No results for input(s): TSH, T4TOTAL, FREET4, T3FREE, THYROIDAB in the last 72 hours. Anemia Panel: No results  for input(s): VITAMINB12, FOLATE, FERRITIN, TIBC, IRON, RETICCTPCT in the last 72 hours. Sepsis Labs: No results for input(s): PROCALCITON, LATICACIDVEN in the last 168 hours.  No results found for this or any previous visit (from the past 240 hour(s)).    Radiology Studies: Dg Chest Port 1 View  Result Date: 06/18/2017 CLINICAL DATA:  Pneumonia. EXAM: PORTABLE CHEST 1 VIEW COMPARISON:  June 16, 2017. FINDINGS: Unchanged positioning of the tracheostomy tube, enteric tube, and right internal jugular central venous catheter. Stable cardiomegaly. Small bilateral pleural effusions, possibly slightly improved. Bibasilar atelectasis. No pneumothorax. No acute osseous abnormality. Several healed rib fractures are again noted. IMPRESSION: Small bilateral pleural effusions, possibly slightly improved when compared to prior study. Electronically Signed   By: Titus Dubin M.D.   On: 06/18/2017 08:57   Scheduled Meds: . amiodarone  200 mg Oral Daily  . chlorhexidine gluconate (MEDLINE KIT)  15 mL Mouth Rinse BID  . Chlorhexidine Gluconate Cloth  6 each Topical Daily  . folic acid  1 mg Per Tube Daily  . free water  200 mL Per Tube Q8H  . lactulose  30 g Oral BID  . levothyroxine  50 mcg Per Tube QAC breakfast  . mouth rinse  15 mL Mouth Rinse QID  . metoprolol tartrate  50 mg Per NG tube BID  . pantoprazole sodium  40 mg Per Tube Daily  . phenytoin (DILANTIN) IV  100 mg Intravenous Q12H  . potassium chloride  40 mEq Oral TID  . pregabalin  75 mg Per Tube BID  . scopolamine  1 patch Transdermal Q72H  . sodium chloride flush  10-40 mL Intracatheter Q12H  . sulfamethoxazole-trimethoprim  1 tablet Per Tube Q12H  . thiamine  100 mg Per Tube Daily   Continuous Infusions: .  feeding supplement (VITAL AF 1.2 CAL) 1,000 mL (06/18/17 0900)  . lacosamide (VIMPAT) IV Stopped (06/18/17 1100)  . levETIRAcetam Stopped (06/18/17 1221)     LOS: 20 days    Verneita Griffes, MD [facility] [phone]

## 2017-06-18 NOTE — Progress Notes (Signed)
late entry  Long discussion with son on telephone 9/13 regarding also for care as well as whether patient's condition will improve I discussed with patient's son Hilliard Clark that although I see a change from 9/12-9/13, I'm not sure if this change will consistently dictate a healing trajectory and given the fact that patient is still on ventilator and does have multiple system issues in terms of renal insufficiency A. fib and other concerns that the burden of his illness probably will dictate his final outcome which would be poor. we did discuss that neurologist had seen the patient and does not also think that patient's trajectory is one that will be compatible with long-term durability and I think that patient will suffer eventually from failure to thrive  We will revisit this in 48 hours and I will discuss with Shawn at that time I appreciate palliative care setting up a meetingon 9/17 with the family to holistically discuss this  Verneita Griffes, MD [facility] [phone]

## 2017-06-18 NOTE — Progress Notes (Signed)
[facility] NOTE  Pharmacy Consult for Phenytoin Indication: Seizures  Allergies  Allergen Reactions  . Nsaids Other (See Comments)    Acute gastric ulcers  . Oxycodone Other (See Comments)    Per the risk calculating tool RIOSORD: (Risk Index for Overdose or Serious Opioid -induced Respiratory Depression Risk ) calculated risk in ensuing 6 months  = 83% ( see Problem List for discussion)    Patient Measurements: Height: 5\' 11"  (180.3 cm) Weight: 202 lb 2.6 oz (91.7 kg) IBW/kg (Calculated) : 75.3   Vital Signs: Temp: 98 F (36.7 C) (09/14 0905) Temp Source: Oral (09/14 0905) BP: 102/71 (09/14 0905) Pulse Rate: 114 (09/14 0905) Intake/Output from previous day: 09/13 0701 - 09/14 0700 In: 2201.6 [I.V.:281.6; NG/GT:1540; IV Piggyback:380] Out: 4200 [Urine:2300; Stool:1900] Intake/Output from this shift: No intake/output data recorded.  Labs:  Recent Labs  06/16/17 0425 06/17/17 0357 06/17/17 1030 06/17/17 1201 06/18/17 0346 06/18/17 0900  WBC 7.7  --  6.2  --   --   --   HGB 7.6*  --  7.4*  --   --   --   HCT 23.3*  --  22.7*  --   --   --   PLT 164  --  212  --   --   --   CREATININE 0.77 0.78  --   --  0.77  --   MG 1.9 1.8  --  1.8  --  2.0  ALBUMIN 2.1*  --   --   --  2.0*  --   PROT 5.4*  --   --   --   --   --   AST 42*  --   --   --   --   --   ALT 17  --   --   --   --   --   ALKPHOS 63  --   --   --   --   --   BILITOT 0.7  --   --   --   --   --    Estimated Creatinine Clearance: 91 mL/min (by C-G formula based on SCr of 0.77 mg/dL).  Medical History: Past Medical History:  Diagnosis Date  . Abnormal TSH   . Acute gastric ulcer   . Acute gastritis with hemorrhage   . Alcohol abuse   . Alcohol dependence ([place]) 02/02/2014  . Anemia   . Bifascicular block   . Cirrhosis ([place]) 06/04/2012  . Depressive disorder 02/01/2014  . Dizziness and giddiness 12/13/2014  . DVT of lower limb, acute ([place]) 06/06/2012  . DVT, lower extremity, recurrent  ([place])    a. noted 2013. b. also dx 06/2016.  Marland Kitchen Essential hypertension   . Fatty liver   . Gallstones   . Gastritis 06/07/2012  . GERD (gastroesophageal reflux disease)   . GI bleed due to NSAIDs 12/13/2014  . Granulomatous gastritis   . Hematuria 06/04/2012  . Hepatitis C   . Hepatocellular carcinoma ([place])   . Heroin abuse    "I haven't done that since I don't know when."  . Heroin overdose 02/20/2014  . Neuropathy   . NICM (nonischemic cardiomyopathy) ([place])    a. 04/2015: EF 45-50% by cath. b. EF 40-45% by echo 06/2016.  . NSTEMI (non-ST elevated myocardial infarction) ([place])    a. 04/2015 - patent coronaries. Etiology possibly due to coronary spasm versus embolus, stress cardiomyopathy (atypical), and aborted infarction related to plaque rupture with thrombosis and dissolution.  Amlodipine started. Not on antiplatelets due to GIB/cirrhosis history.  . Oral thrush 06/05/2012  . Polysubstance abuse    THC, alcohol, heroin  . Prolonged Q-T interval on ECG    a. 12/2014 - treated with magnesium.  . Right knee pain 12/13/2014  . S/P alcohol detoxification 02/02/2014  . Stroke ([place]) 06/2016  . SVT (supraventricular tachycardia) ([place])    a. 12/2014 in setting of GIB, ETOH, NSAIDS, gastritis.  . Symptomatic cholelithiasis 12/15/2013  . Thrombocytopenia ([place])   . Upper GI bleeding 12/13/2014  . Weight loss 06/04/2012   Assessment: 75yom started on phenytoin 100mg  IV q8 for seizures on 8/28. Level drawn on 9/7 was supratherapeutic and dose reduced to 100mg  IV q12. Doses then held 9/9 due to patient being unresponsive (last dose 9/9 @ 0938) and subsequently resumed at lower dose on 100 mg IV BID on 9/10. No new seizures noted and AED regimen consist of Dilantin, Keppra and Vimpat.   Dilantin level corrected for albumin is ~ 9 today. No new seizures reported.   Goal of Therapy:  Total phenytoin level 15-20  Plan:  1) Continue phenytoin 100mg  IV q12 2) Will check another dilantin level in next 5-7 days  unless clinical course dictates otherwise   Vincenza Hews, PharmD, BCPS 06/18/2017, 10:03 AM

## 2017-06-18 NOTE — Progress Notes (Signed)
Subjective:  Unable to obtain. Patient nonverbal  Objective:  Vital Signs in the last 24 hours: Temp:  [98 F (36.7 C)-99.1 F (37.3 C)] 98.8 F (37.1 C) (09/14 1606) Pulse Rate:  [79-123] 112 (09/14 1606) Resp:  [16-26] 16 (09/14 1606) BP: (93-120)/(62-83) 95/65 (09/14 1606) SpO2:  [100 %] 100 % (09/14 1606) FiO2 (%):  [35 %-40 %] 35 % (09/14 1515) Weight:  [91.7 kg (202 lb 2.6 oz)] 91.7 kg (202 lb 2.6 oz) (09/14 0351)  Intake/Output from previous day: 09/13 0701 - 09/14 0700 In: 2201.6 [I.V.:281.6; NG/GT:1540; IV Piggyback:380] Out: 4200 [Urine:2300; Stool:1900] Intake/Output from this shift: Total I/O In: 1110 [I.V.:10; NG/GT:960; IV Piggyback:140] Out: 725 [Urine:365; Stool:360]  Physical Exam: Physical Exam  Nursing note and vitals reviewed. Constitutional:  Ill appearing man with anasarca  Neck: JVD (Elevated to earlobe) present.  Cardiovascular:  Irregularly irregular rhythm. No significant murmur heard.  Respiratory:  Coarse breath sounds bilaterally. Tracheostomy in place  GI: He exhibits distension. There is no tenderness.  NG tube in place  Musculoskeletal: He exhibits edema (Anasarca).  Right forearm in dressing  Neurological:  Barely arousable to sternal rub. Able to open eyes today. Unable to perform detailed neurological exam.  Skin:  Edematous and shiny   Lab Results:  Recent Labs  06/16/17 0425 06/17/17 1030  WBC 7.7 6.2  HGB 7.6* 7.4*  PLT 164 212    Recent Labs  06/17/17 0357 06/18/17 0346  NA 145 149*  K 3.3* 3.9  CL 117* 123*  CO2 22 22  GLUCOSE 123* 131*  BUN 31* 32*  CREATININE 0.78 0.77   No results for input(s): TROPONINI in the last 72 hours.  Invalid input(s): CK, MB Hepatic Function Panel  Recent Labs  06/16/17 0425 06/18/17 0346  PROT 5.4*  --   ALBUMIN 2.1* 2.0*  AST 42*  --   ALT 17  --   ALKPHOS 63  --   BILITOT 0.7  --      Cardiac Studies:  Assessment: Afib w/RVR   Anasarca Encephalopathy Chronic respiratory failure Alcohol dependence H/o CVA, status eplilepticus  Recommendation: Okay to continue amiodarone 200 mg daily. He my not need long term amiodarone if rate controlled with uptitration of beta bloccker. Increased metoprolol to 50 mg twice a day per tube. Anasarca and generalized edema likely combination of malnourishment and volume overload. Recommend Lasix 40 mg IV twice a day. Electrolyte replacement as you are doing. Keep K around 4, Mag around 2. Patient previously on dabigatran. High CHA2DS2VASc score. Defer anticoagulation decision to the primary team given concern for liver cirrhosis. Unable to have this discussion with the patient.  Rest of the management as you are doing  Please contact us back with any questions. Cardiology will sign off.    LOS: 20 days    Manish J Patwardhan 06/18/2017, 5:05 PM

## 2017-06-19 ENCOUNTER — Inpatient Hospital Stay (HOSPITAL_COMMUNITY): Payer: Medicare Other

## 2017-06-19 LAB — COMPREHENSIVE METABOLIC PANEL
ALK PHOS: 65 U/L (ref 38–126)
ALT: 30 U/L (ref 17–63)
ANION GAP: 3 — AB (ref 5–15)
AST: 73 U/L — ABNORMAL HIGH (ref 15–41)
Albumin: 2.1 g/dL — ABNORMAL LOW (ref 3.5–5.0)
BILIRUBIN TOTAL: 0.7 mg/dL (ref 0.3–1.2)
BUN: 31 mg/dL — ABNORMAL HIGH (ref 6–20)
CALCIUM: 8.7 mg/dL — AB (ref 8.9–10.3)
CO2: 20 mmol/L — AB (ref 22–32)
CREATININE: 0.78 mg/dL (ref 0.61–1.24)
Chloride: 125 mmol/L — ABNORMAL HIGH (ref 101–111)
Glucose, Bld: 118 mg/dL — ABNORMAL HIGH (ref 65–99)
Potassium: 4.4 mmol/L (ref 3.5–5.1)
SODIUM: 148 mmol/L — AB (ref 135–145)
TOTAL PROTEIN: 5.6 g/dL — AB (ref 6.5–8.1)

## 2017-06-19 LAB — CBC WITH DIFFERENTIAL/PLATELET
BASOS ABS: 0 10*3/uL (ref 0.0–0.1)
BASOS PCT: 1 %
EOS ABS: 0.1 10*3/uL (ref 0.0–0.7)
Eosinophils Relative: 2 %
HEMATOCRIT: 24.1 % — AB (ref 39.0–52.0)
HEMOGLOBIN: 7.7 g/dL — AB (ref 13.0–17.0)
Lymphocytes Relative: 17 %
Lymphs Abs: 1 10*3/uL (ref 0.7–4.0)
MCH: 30.3 pg (ref 26.0–34.0)
MCHC: 32 g/dL (ref 30.0–36.0)
MCV: 94.9 fL (ref 78.0–100.0)
MONOS PCT: 9 %
Monocytes Absolute: 0.6 10*3/uL (ref 0.1–1.0)
NEUTROS ABS: 4.5 10*3/uL (ref 1.7–7.7)
NEUTROS PCT: 71 %
Platelets: 258 10*3/uL (ref 150–400)
RBC: 2.54 MIL/uL — ABNORMAL LOW (ref 4.22–5.81)
RDW: 19.3 % — ABNORMAL HIGH (ref 11.5–15.5)
WBC: 6.3 10*3/uL (ref 4.0–10.5)

## 2017-06-19 LAB — GLUCOSE, CAPILLARY
GLUCOSE-CAPILLARY: 112 mg/dL — AB (ref 65–99)
GLUCOSE-CAPILLARY: 115 mg/dL — AB (ref 65–99)
GLUCOSE-CAPILLARY: 110 mg/dL — AB (ref 65–99)
Glucose-Capillary: 106 mg/dL — ABNORMAL HIGH (ref 65–99)
Glucose-Capillary: 115 mg/dL — ABNORMAL HIGH (ref 65–99)

## 2017-06-19 LAB — PROTIME-INR
INR: 1.21
Prothrombin Time: 15.2 seconds (ref 11.4–15.2)

## 2017-06-19 MED ORDER — FREE WATER
400.0000 mL | Freq: Three times a day (TID) | Status: DC
  Administered 2017-06-19 – 2017-06-21 (×7): 400 mL

## 2017-06-19 NOTE — Progress Notes (Signed)
PROGRESS NOTE [facility]   XAIVER ROSKELLEY   VWU:[id number] DOB: Jun 23, 1941  DOA: 05/28/2017 PCP: Thressa Sheller, MD   Brief Narrative:  39 hepatitis C,  alcohol abuse with cirrhosis,  alcoholic cardiomyopathy and recently diagnosed with seizures  Presented to the hospital with weakness and left arm tremor he was admitted for suspected seizures to the medical floor on 8/24  Subsequently patient on 8/27 developed status epilepticus and altered mental status for which he required intubation fora irway protection and was transferred to the ICU- elevated ammonia levels.  Neurology was consulted, burst suppression was started on 8/28 which lasted for over 3 days.  At one point patient was hypotensive required, pressor for a short period.   Patient has been unable to be weaned off ventilator, tracheostomy was placed on 06/09/2017. Given poor prognosis palliative care was consulted.  MRI of brain and EEG was repeated consistent with seizures.  Patient developed cellulitis of the neck been treated with bactrim   Subjective:  Minimal changes Now back on PSV 15/5 No family +   Assessment & Plan:  Status epilepticus - status post burst suppression -marginally more responsive but not conversant--waxing and waning mentation Neurology following intermittently, AEDS per their recommendation Off sedation Continue Vimpat 50 q 12, Keppra 1000 q12 and phenytoin 100 q 12-? Convert to orals?  Phenytoin level noted - 4.7 correcting to~ 7.4 9/14  Hepatic encephalopathy  Hx Hep C with ETOH cirrhosis  Ammonia levels trending down - continue lactulose via NGT  Ammonia levels have dropped to 38 since admit--have cut the dose of lactulose 30 iii/day -- 20 ii/d 9/13  Anasarca  Albumin low  Lasix has been on hold since 9/12  negative balance of 2.6 L Chest XR 9/12 STABLE EFFUSIONS, rpt cxr 9/14 no sig change Placed TED hose on 9/13  Chronic respiratory failure due to encephalopathy -    s/p trach 9/5  Continue scopolamine for secretions  On PSV intermittently with PRVC as back-up have discussed with family difficulty weaning off VENT 9/13] CXR in am  Alcoholic cardiomyopathy/Afib with RVR - HR stable  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, diffuse hypokinesis  Initially was hypotensive treated with pressor - now weaned off  Remains Amio gtt for intermittent Afib with RVR --transitioning to by mouth amiodarone 400 twice a day  On aspirin for anticoagulation for now, poor candidate for NOAC  TCP 112--->164  Cardiology saw the patient and felt reasonable to keep amiodarone 200 daily for now  increased metoprolol 50 twice a day 9./14  Will try to change to amio 100 in the next cpl days dependant on [place] discussions    Anemia of critical illness & Thrombocytopenia - likely for cirrhosis and acute illness  Hemoglobin is now 7.6, Normocytic No dark stools [has flexiseal] No changes today  Hypokalemia/Hypomagnesemia  Hypernatremia of 148 Continue K packets 40 tid for now--recheck in am Get Mag in am  Adding free water 200 q 8-->400q8  Neck cellulitis  Trach site with mild erythema, and some purulence  Patient has been started on Bactrim which may need to be changes in light of AKI Follow up closely if worse will get [place] to r/o abscess  Goals of care  Patient remains full code, eventually will need PEG tube? Will talk to family  DVT prophylaxis: SCD's  Code Status: FULL  Family Communication: None at bedside --see discussion from my note earlier today 9/14 disposition Plan: TBD --he is not a candidate for long-term  acute care and will be need skilled facility placement   Consultants:   PCCM   Neurology   Procedures:   Rt IJ 8/28>>  Trach 9/5   Vent Mode: CPAP;PSV FiO2 (%):  [35 %] 35 % Set Rate:  [16 bmp] 16 bmp Vt Set:  [600 mL] 600 mL PEEP:  [5 cmH20] 5 cmH20 Pressure Support:  [15 cmH20] 15 cmH20 Plateau Pressure:  [18 cmH20-19 cmH20] 18  cmH20   Antimicrobials: Anti-infectives    Start     Dose/Rate Route Frequency Ordered Stop   06/15/17 2200  sulfamethoxazole-trimethoprim (BACTRIM DS,SEPTRA DS) 800-160 MG per tablet 1 tablet     1 tablet Per Tube Every 12 hours 06/15/17 1052     06/13/17 1000  sulfamethoxazole-trimethoprim (BACTRIM,SEPTRA) 400-80 MG per tablet 1 tablet  Status:  Discontinued     1 tablet Oral Every 12 hours 06/13/17 0857 06/15/17 1052      Objective: Vitals:   06/19/17 0445 06/19/17 0749 06/19/17 0754 06/19/17 0800  BP: 99/67 100/69 100/69 104/68  Pulse: (!) 115 (!) 120 (!) 120 (!) 119  Resp: 16  10 15   Temp: 98.9 F (37.2 C)   99.1 F (37.3 C)  TempSrc: Axillary   Oral  SpO2: 100%  100% 100%  Weight: 90.3 kg (199 lb 1.2 oz)   92.3 kg (203 lb 7.8 oz)  Height:        Intake/Output Summary (Last 24 hours) at 06/19/17 1112 Last data filed at 06/19/17 0840  Gross per 24 hour  Intake          2246.67 ml  Output             2465 ml  Net          -218.33 ml   Filed Weights   06/18/17 0351 06/19/17 0445 06/19/17 0800  Weight: 91.7 kg (202 lb 2.6 oz) 90.3 kg (199 lb 1.2 oz) 92.3 kg (203 lb 7.8 oz)    Examination:  General: Not responsive, deconditioned --Arcus senilis  EENT: Trach in place, mild erythema- no gross purulence  Cardiovascular: Irr Irr, Z6X0, + systolic murmur , rate 960'A  Respiratory: on vent poor exam Abdominal: Soft, NT, ND Neuro: Not responsive to verbal, tactile and painful stimuli.  Extremities: anasarca-swelling to arms as well-TEDS+ Skin: No rash to lower extremities but toxin not checked  Data Reviewed: I have personally reviewed following labs and imaging studies  CBC:  Recent Labs Lab 06/13/17 0230 06/16/17 0425 06/17/17 1030 06/19/17 0520  WBC 10.4 7.7 6.2 6.3  NEUTROABS  --  6.0 4.8 4.5  HGB 8.4* 7.6* 7.4* 7.7*  HCT 24.5* 23.3* 22.7* 24.1*  MCV 88.1 91.0 91.9 94.9  PLT 112* 164 212 540   Basic Metabolic Panel:  Recent Labs Lab 06/13/17 0230   06/15/17 0524 06/16/17 0425 06/17/17 0357 06/17/17 1201 06/18/17 0346 06/18/17 0900 06/19/17 0520  NA 136  < > 140 142 145  --  149*  --  148*  K 3.0*  < > 2.8* 3.0* 3.3*  --  3.9  --  4.4  CL 107  < > 112* 117* 117*  --  123*  --  125*  CO2 24  < > 22 22 22   --  22  --  20*  GLUCOSE 146*  < > 128* 129* 123*  --  131*  --  118*  BUN 22*  < > 27* 29* 31*  --  32*  --  31*  CREATININE 0.84  < >  0.79 0.77 0.78  --  0.77  --  0.78  CALCIUM 8.1*  < > 8.6* 8.6* 8.7*  --  8.7*  --  8.7*  MG 2.0  --  1.7 1.9 1.8 1.8  --  2.0  --   PHOS 2.1*  --   --   --   --   --   --   --   --   < > = values in this interval not displayed. GFR: Estimated Creatinine Clearance: 91.2 mL/min (by C-G formula based on SCr of 0.78 mg/dL). Liver Function Tests:  Recent Labs Lab 06/13/17 0230 06/13/17 2150 06/16/17 0425 06/18/17 0346 06/19/17 0520  AST 57*  --  42*  --  73*  ALT 20  --  17  --  30  ALKPHOS 65  --  63  --  65  BILITOT 0.8  --  0.7  --  0.7  PROT 5.3*  --  5.4*  --  5.6*  ALBUMIN 1.6* 1.6* 2.1* 2.0* 2.1*   No results for input(s): LIPASE, AMYLASE in the last 168 hours.  Recent Labs Lab 06/13/17 0230 06/14/17 0521 06/15/17 1925 06/16/17 0425  AMMONIA 89* 70* 58* 38*   Coagulation Profile:  Recent Labs Lab 06/19/17 0520  INR 1.21   Cardiac Enzymes: No results for input(s): CKTOTAL, CKMB, CKMBINDEX, TROPONINI in the last 168 hours. BNP (last 3 results) No results for input(s): PROBNP in the last 8760 hours. HbA1C: No results for input(s): HGBA1C in the last 72 hours. CBG:  Recent Labs Lab 06/18/17 1609 06/18/17 2023 06/18/17 2358 06/19/17 0350 06/19/17 0750  GLUCAP 99 117* 116* 112* 115*   Lipid Profile: No results for input(s): CHOL, HDL, LDLCALC, TRIG, CHOLHDL, LDLDIRECT in the last 72 hours. Thyroid Function Tests: No results for input(s): TSH, T4TOTAL, FREET4, T3FREE, THYROIDAB in the last 72 hours. Anemia Panel: No results for input(s): VITAMINB12, FOLATE,  FERRITIN, TIBC, IRON, RETICCTPCT in the last 72 hours. Sepsis Labs: No results for input(s): PROCALCITON, LATICACIDVEN in the last 168 hours.  No results found for this or any previous visit (from the past 240 hour(s)).    Radiology Studies: Dg Chest Port 1 View  Result Date: 06/19/2017 CLINICAL DATA:  Followup respiratory failure. EXAM: PORTABLE CHEST 1 VIEW COMPARISON:  06/18/2017 FINDINGS: Tracheostomy, nasogastric tube and right internal jugular central line are unchanged. Persistent bilateral lower lobe infiltrate and volume loss, not visibly changed since yesterday. Slow trend of improvement since films before that. IMPRESSION: Persistent, slowly improving bilateral lower lobe infiltrate and volume loss. Electronically Signed   By: Nelson Chimes M.D.   On: 06/19/2017 08:24   Dg Chest Port 1 View  Result Date: 06/18/2017 CLINICAL DATA:  Pneumonia. EXAM: PORTABLE CHEST 1 VIEW COMPARISON:  June 16, 2017. FINDINGS: Unchanged positioning of the tracheostomy tube, enteric tube, and right internal jugular central venous catheter. Stable cardiomegaly. Small bilateral pleural effusions, possibly slightly improved. Bibasilar atelectasis. No pneumothorax. No acute osseous abnormality. Several healed rib fractures are again noted. IMPRESSION: Small bilateral pleural effusions, possibly slightly improved when compared to prior study. Electronically Signed   By: Titus Dubin M.D.   On: 06/18/2017 08:57   Scheduled Meds: . amiodarone  200 mg Oral Daily  . chlorhexidine gluconate (MEDLINE KIT)  15 mL Mouth Rinse BID  . Chlorhexidine Gluconate Cloth  6 each Topical Daily  . folic acid  1 mg Per Tube Daily  . free water  200 mL Per Tube Q8H  . lactulose  30 g Oral BID  . levothyroxine  50 mcg Per Tube QAC breakfast  . mouth rinse  15 mL Mouth Rinse QID  . metoprolol tartrate  50 mg Per NG tube BID  . pantoprazole sodium  40 mg Per Tube Daily  . phenytoin (DILANTIN) IV  100 mg Intravenous Q12H    . potassium chloride  40 mEq Oral TID  . pregabalin  75 mg Per Tube BID  . scopolamine  1 patch Transdermal Q72H  . sodium chloride flush  10-40 mL Intracatheter Q12H  . sulfamethoxazole-trimethoprim  1 tablet Per Tube Q12H  . thiamine  100 mg Per Tube Daily   Continuous Infusions: . feeding supplement (VITAL AF 1.2 CAL) 1,000 mL (06/19/17 0840)  . lacosamide (VIMPAT) IV 50 mg (06/19/17 1035)  . levETIRAcetam 1,000 mg (06/19/17 1041)     LOS: 21 days    Verneita Griffes, MD [facility] [phone]

## 2017-06-20 ENCOUNTER — Inpatient Hospital Stay (HOSPITAL_COMMUNITY): Payer: Medicare Other

## 2017-06-20 LAB — BASIC METABOLIC PANEL
ANION GAP: 3 — AB (ref 5–15)
BUN: 30 mg/dL — ABNORMAL HIGH (ref 6–20)
CALCIUM: 8.6 mg/dL — AB (ref 8.9–10.3)
CHLORIDE: 126 mmol/L — AB (ref 101–111)
CO2: 21 mmol/L — AB (ref 22–32)
Creatinine, Ser: 0.71 mg/dL (ref 0.61–1.24)
GFR calc non Af Amer: >60 mL/min (ref >60)
GLUCOSE: 117 mg/dL — AB (ref 65–99)
Potassium: 4.5 mmol/L (ref 3.5–5.1)
Sodium: 150 mmol/L — ABNORMAL HIGH (ref 135–145)

## 2017-06-20 LAB — GLUCOSE, CAPILLARY
GLUCOSE-CAPILLARY: 105 mg/dL — AB (ref 65–99)
GLUCOSE-CAPILLARY: 111 mg/dL — AB (ref 65–99)
GLUCOSE-CAPILLARY: 103 mg/dL — AB (ref 65–99)
Glucose-Capillary: 111 mg/dL — ABNORMAL HIGH (ref 65–99)
Glucose-Capillary: 112 mg/dL — ABNORMAL HIGH (ref 65–99)
Glucose-Capillary: 116 mg/dL — ABNORMAL HIGH (ref 65–99)

## 2017-06-20 MED ORDER — SODIUM CHLORIDE 0.9 % IV SOLN
1.5000 g | Freq: Four times a day (QID) | INTRAVENOUS | Status: DC
  Administered 2017-06-20 – 2017-06-22 (×9): 1.5 g via INTRAVENOUS
  Filled 2017-06-20 (×11): qty 1.5

## 2017-06-20 MED ORDER — AMIODARONE HCL 100 MG PO TABS: 100.0000 mg | ORAL_TABLET | Freq: Every day | ORAL | Status: DC

## 2017-06-20 MED ORDER — SODIUM BICARBONATE 8.4 % IV SOLN
INTRAVENOUS | Status: DC
  Filled 2017-06-20: qty 100

## 2017-06-20 MED ORDER — AMIODARONE HCL 200 MG PO TABS
300.0000 mg | ORAL_TABLET | Freq: Every day | ORAL | Status: DC
  Administered 2017-06-20 – 2017-06-23 (×4): 300 mg via ORAL
  Filled 2017-06-20 (×5): qty 1

## 2017-06-20 MED ORDER — SODIUM BICARBONATE 8.4 % IV SOLN
INTRAVENOUS | Status: DC
  Administered 2017-06-20: 09:00:00 via INTRAVENOUS
  Filled 2017-06-20: qty 100

## 2017-06-20 MED ORDER — LACTULOSE 10 GM/15ML PO SOLN
10.0000 g | Freq: Every day | ORAL | Status: DC
  Administered 2017-06-20 – 2017-06-24 (×5): 10 g via ORAL
  Filled 2017-06-20 (×5): qty 15

## 2017-06-20 NOTE — Progress Notes (Signed)
Pharmacy Antibiotic Note  Nathan Frank is a 76 y.o. male admitted on 05/28/2017 with aspiration pneumonia.  Pharmacy has been consulted for unasyn dosing.  Worsening CXR with fever spike today.  Plan: Start Unasyn 1.5 g IV every 6 hours Monitor clinical progression, LOT  Height: 5\' 11"  (180.3 cm) Weight: 198 lb 13.7 oz (90.2 kg) IBW/kg (Calculated) : 75.3  Temp (24hrs), Avg:98.8 F (37.1 C), Min:98 F (36.7 C), Max:100.4 F (38 C)   Recent Labs Lab 06/16/17 0425 06/17/17 0357 06/17/17 1030 06/18/17 0346 06/19/17 0520 06/20/17 0405  WBC 7.7  --  6.2  --  6.3  --   CREATININE 0.77 0.78  --  0.77 0.78 0.71    Estimated Creatinine Clearance: 83.7 mL/min (by C-G formula based on SCr of 0.71 mg/dL).    Allergies  Allergen Reactions  . Nsaids Other (See Comments)    Acute gastric ulcers  . Oxycodone Other (See Comments)    Per the risk calculating tool RIOSORD: (Risk Index for Overdose or Serious Opioid -induced Respiratory Depression Risk ) calculated risk in ensuing 6 months  = 83% ( see Problem List for discussion)    Antimicrobials this admission: Bactrim 9/9> unasyn 9/16>  Microbiology results: Resp Cx 8/28> normal flora MRSA PCR 8/25> neg  Bertis Ruddy, PharmD Pharmacy Resident Pager #: [phone] 06/20/2017 9:46 AM

## 2017-06-20 NOTE — Progress Notes (Addendum)
PROGRESS NOTE [facility]   GOBLE FUDALA   HWE:[id number] DOB: [birth year]  DOA: 05/28/2017 PCP: Thressa Sheller, MD   Brief Narrative:   62 hepatitis C,  alcohol abuse with cirrhosis,  alcoholic cardiomyopathy and recently diagnosed with seizures  Presented to the hospital with weakness and left arm tremor he was admitted for suspected seizures to the medical floor on 8/24  Subsequently patient on 8/27 developed status epilepticus and altered mental status for which he required intubation fora irway protection and was transferred to the ICU- elevated ammonia levels.  Neurology was consulted, burst suppression was started on 8/28 which lasted for over 3 days.  At one point patient was hypotensive required, pressor for a short period.   Patient has been unable to be weaned off ventilator, tracheostomy was placed on 06/09/2017. Given poor prognosis palliative care was consulted.  MRI of brain and EEG was repeated consistent with seizures.  Patient developed cellulitis of the neck been treated with bactrim   Subjective:  Opens eyes but no meaningful response and unable to move  on PSV 10/5 No family + Nursing reports uneventful night   Assessment & Plan:  Status epilepticus - status post burst suppression -marginally more responsive but not conversant--waxing and waning mentation Neurology following intermittently, AEDS per their recommendation Off sedation Continue Vimpat 50 q 12, Keppra 1000 q12 and phenytoin 100 q 12-? Convert to orals?--decide after palliative meeting  Phenytoin level noted - 4.7 correcting to~ 7.4 9/14  Hepatic encephalopathy  Hx Hep C with ETOH cirrhosis  developing met acidosis with hypernatremia-from volume contraction as well as lactulose induced diarr Hypokalemia/Hypomagnesemia  Hypernatremia of 148-->150 K packets 40 tid --stop K 9/16  free water 200 q 8-->400q8 9/15 Ammonia levels trending down - continue lactulose via NGT --changed  dosing to 10 mg as copious diarrhea as well as acidosis from this Ammonia levels have dropped to 38 since admit--have cut the dose of lactulose 30 iii/day -- 20 ii/d 9/13-->10 od 9.16 Adding low rate IV bicarb 75 cc/h to see if can forestall acidosis and bring down sodium.  Unlikely to make a meaningful difference in the long term  Anasarca  Albumin low  Lasix has been on hold since 9/12  negative balance of 7.9 L Chest XR 9/12 STABLE EFFUSIONS, rpt cxr 9/14 no sig change Placed TED hose on 9/13  Chronic respiratory failure due to encephalopathy -  Aspiration PNa based on cxr 9/16 s/p trach 9/5  Continue scopolamine for secretions  On PSV intermittently with PRVC as back-up have discussed with family difficulty weaning off VENT 9/13 CXR 9/16 shows ? R lung opacity--looks a little worse than prior.  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, diffuse hypokinesis  Initially was hypotensive treated with pressor - now weaned off  Remains Amio gtt for intermittent Afib with RVR --transitioning to by mouth amiodarone 400 twice a day  On aspirin for anticoagulation for now, poor candidate for NOAC  TCP 112--->164  Cardiology saw the patient and felt reasonable to keep amiodarone 200 daily for now  increased metoprolol 50 twice a day 9./14  -patient is slightly hypotensive--increasing amio 200-300 daily  Anemia of critical illness & Thrombocytopenia   likely for cirrhosis and acute illness  Hemoglobin is now 7.6, Normocytic No  dark stools [has flexiseal] stable  Neck cellulitis  Trach site with mild erythema, and some purulence  Patient has been started on Bactrim -->end date 9/17 Follow up closely if worse will get [place] to r/o abscess  Goals of care  Patient remains full code, eventually will  need PEG tube? Will talk to family  DVT prophylaxis: SCD's  Code Status: FULL  Family Communication: None at bedside --see discussion from my note earlier today 9/14 Called to d/w son Hilliard Clark today 9/16 Will follow with pallaitve care meeting tomorrow 9/17 Very poor outcome-minimal improvements and willnot make a meaningful recovery disposition Plan: TBD --he is not a candidate for long-term acute care and will be need skilled facility placement   Consultants:   PCCM   Neurology   Procedures:   Rt IJ 8/28>>  Trach 9/5   Vent Mode: CPAP;PSV FiO2 (%):  [35 %] 35 % Set Rate:  [16 bmp] 16 bmp Vt Set:  [600 mL] 600 mL PEEP:  [5 cmH20] 5 cmH20 Pressure Support:  [10 cmH20-15 cmH20] 10 cmH20 Plateau Pressure:  [19 cmH20-20 cmH20] 19 cmH20   Antimicrobials: Anti-infectives    Start     Dose/Rate Route Frequency Ordered Stop   06/15/17 2200  sulfamethoxazole-trimethoprim (BACTRIM DS,SEPTRA DS) 800-160 MG per tablet 1 tablet     1 tablet Per Tube Every 12 hours 06/15/17 1052     06/13/17 1000  sulfamethoxazole-trimethoprim (BACTRIM,SEPTRA) 400-80 MG per tablet 1 tablet  Status:  Discontinued     1 tablet Oral Every 12 hours 06/13/17 0857 06/15/17 1052      Objective: Vitals:   06/20/17 0405 06/20/17 0420 06/20/17 0737 06/20/17 0822  BP: 98/64 98/64 93/65  101/65  Pulse: (!) 118  (!) 121   Resp: 16  19   Temp: 98.5 F (36.9 C)   (!) 100.4 F (38 C)  TempSrc: Axillary   Axillary  SpO2: 100% 100% 100%   Weight: 90.2 kg (198 lb 13.7 oz)     Height:        Intake/Output Summary (Last 24 hours) at 06/20/17 0900 Last data filed at 06/20/17 0758  Gross per 24 hour  Intake          1773.33 ml  Output             3100 ml  Net         -1326.67 ml   Filed Weights   06/19/17 0445 06/19/17 0800 06/20/17 0405  Weight: 90.3 kg (199 lb 1.2 oz) 92.3 kg (203 lb 7.8 oz) 90.2 kg (198 lb 13.7 oz)    Examination:  General: Not responsive, deconditioned --Arcus senilis  EENT: Trach  in place, mild erythema- no gross purulence, looks cleaner Cardiovascular: Irr Irr, P9J0, + systolic murmur , rate 932'I and ustaining Respiratory: on vent poor exam Abdominal: Soft, NT, ND Neuro: Not responsive to verbal, tactile and painful stimuli.  Extremities: anasarca-swelling to arms as well-TEDS+ Skin: No rash to lower extremities   Data Reviewed: I have personally reviewed following labs and imaging studies  CBC:  Recent Labs Lab 06/16/17 0425 06/17/17 1030 06/19/17 0520  WBC 7.7 6.2 6.3  NEUTROABS 6.0 4.8 4.5  HGB 7.6* 7.4* 7.7*  HCT 23.3* 22.7* 24.1*  MCV 91.0 91.9 94.9  PLT 164 212 712   Basic Metabolic Panel:  Recent Labs Lab 06/15/17 0524 06/16/17 0425 06/17/17 0357 06/17/17 1201 06/18/17 0346 06/18/17 0900 06/19/17 0520 06/20/17 0405  NA 140 142 145  --  149*  --  148* 150*  K 2.8* 3.0* 3.3*  --  3.9  --  4.4 4.5  CL 112* 117* 117*  --  123*  --  125* 126*  CO2 22 22 22   --  22  --  20* 21*  GLUCOSE 128* 129* 123*  --  131*  --  118* 117*  BUN 27* 29* 31*  --  32*  --  31* 30*  CREATININE 0.79 0.77 0.78  --  0.77  --  0.78 0.71  CALCIUM 8.6* 8.6* 8.7*  --  8.7*  --  8.7* 8.6*  MG 1.7 1.9 1.8 1.8  --  2.0  --   --    GFR: Estimated Creatinine Clearance: 83.7 mL/min (by C-G formula based on SCr of 0.71 mg/dL). Liver Function Tests:  Recent Labs Lab 06/13/17 2150 06/16/17 0425 06/18/17 0346 06/19/17 0520  AST  --  42*  --  73*  ALT  --  17  --  30  ALKPHOS  --  63  --  65  BILITOT  --  0.7  --  0.7  PROT  --  5.4*  --  5.6*  ALBUMIN 1.6* 2.1* 2.0* 2.1*   No results for input(s): LIPASE, AMYLASE in the last 168 hours.  Recent Labs Lab 06/14/17 0521 06/15/17 1925 06/16/17 0425  AMMONIA 70* 58* 38*   Coagulation Profile:  Recent Labs Lab 06/19/17 0520  INR 1.21   Cardiac Enzymes: No results for input(s): CKTOTAL, CKMB, CKMBINDEX, TROPONINI in the last 168 hours. BNP (last 3 results) No results for input(s): PROBNP in the last  8760 hours. HbA1C: No results for input(s): HGBA1C in the last 72 hours. CBG:  Recent Labs Lab 06/19/17 1708 06/19/17 2051 06/20/17 0001 06/20/17 0402 06/20/17 0820  GLUCAP 115* 110* 116* 105* 111*   Lipid Profile: No results for input(s): CHOL, HDL, LDLCALC, TRIG, CHOLHDL, LDLDIRECT in the last 72 hours. Thyroid Function Tests: No results for input(s): TSH, T4TOTAL, FREET4, T3FREE, THYROIDAB in the last 72 hours. Anemia Panel: No results for input(s): VITAMINB12, FOLATE, FERRITIN, TIBC, IRON, RETICCTPCT in the last 72 hours. Sepsis Labs: No results for input(s): PROCALCITON, LATICACIDVEN in the last 168 hours.  No results found for this or any previous visit (from the past 240 hour(s)).    Radiology Studies: Dg Chest Port 1 View  Result Date: 06/20/2017 CLINICAL DATA:  Followup pneumonia EXAM: PORTABLE CHEST 1 VIEW COMPARISON:  06/19/2017 FINDINGS: Tracheostomy remains in place. Nasogastric tube enters the abdomen. Right internal jugular central line tip at the SVC RA junction. Persistent right effusion with right lower lobe collapse. Mild atelectasis at the left base. No change since yesterday. IMPRESSION: No change. Right effusion with right lower lung collapse. Mild atelectasis left base. Electronically Signed   By: Nelson Chimes M.D.   On: 06/20/2017 08:34   Dg Chest Port 1 View  Result Date: 06/19/2017 CLINICAL DATA:  Followup respiratory failure. EXAM: PORTABLE CHEST 1 VIEW COMPARISON:  06/18/2017 FINDINGS: Tracheostomy, nasogastric tube and right internal jugular central line are unchanged. Persistent bilateral lower lobe infiltrate and volume loss, not visibly changed since yesterday. Slow trend of improvement since films before that. IMPRESSION: Persistent, slowly improving bilateral lower lobe infiltrate and volume loss. Electronically Signed   By: Nelson Chimes M.D.   On: 06/19/2017 08:24   Scheduled Meds: . amiodarone  300 mg Oral Daily  . chlorhexidine gluconate  (MEDLINE KIT)  15 mL Mouth Rinse BID  . Chlorhexidine Gluconate Cloth  6 each  Topical Daily  . folic acid  1 mg Per Tube Daily  . free water  400 mL Per Tube Q8H  . lactulose  10 g Oral Daily  . levothyroxine  50 mcg Per Tube QAC breakfast  . mouth rinse  15 mL Mouth Rinse QID  . metoprolol tartrate  50 mg Per NG tube BID  . pantoprazole sodium  40 mg Per Tube Daily  . phenytoin (DILANTIN) IV  100 mg Intravenous Q12H  . potassium chloride  40 mEq Oral TID  . pregabalin  75 mg Per Tube BID  . scopolamine  1 patch Transdermal Q72H  . sodium chloride flush  10-40 mL Intracatheter Q12H  . sulfamethoxazole-trimethoprim  1 tablet Per Tube Q12H  . thiamine  100 mg Per Tube Daily   Continuous Infusions: . feeding supplement (VITAL AF 1.2 CAL) 1,000 mL (06/20/17 0311)  . lacosamide (VIMPAT) IV 50 mg (06/20/17 0903)  . levETIRAcetam Stopped (06/19/17 2149)  .  sodium bicarbonate  infusion 1000 mL 75 mL/hr at 06/20/17 0900     LOS: 22 days    Verneita Griffes, MD [facility] [phone]

## 2017-06-20 NOTE — Progress Notes (Signed)
Subjective: No significant changes or events per nursing.   Objective: Current vital signs: BP 95/63   Pulse (!) 114   Temp 100 F (37.8 C) (Oral)   Resp (!) 29   Ht 5' 11"  (1.803 m)   Wt 90.2 kg (198 lb 13.7 oz)   SpO2 100%   BMI 27.73 kg/m  Vital signs in last 24 hours: Temp:  [98 F (36.7 C)-100.4 F (38 C)] 100 F (37.8 C) (09/16 1049) Pulse Rate:  [114-124] 114 (09/16 1049) Resp:  [16-29] 29 (09/16 1049) BP: (93-103)/(63-78) 95/63 (09/16 1000) SpO2:  [99 %-100 %] 100 % (09/16 1049) FiO2 (%):  [35 %] 35 % (09/16 0737) Weight:  [90.2 kg (198 lb 13.7 oz)] 90.2 kg (198 lb 13.7 oz) (09/16 0405)  Intake/Output from previous day: 09/15 0701 - 09/16 0700 In: 1980 [I.V.:20; NG/GT:1680; IV Piggyback:280] Out: 2975 [Urine:1775; Emesis/NG output:300; Stool:900] Intake/Output this shift: Total I/O In: 420 [NG/GT:280; IV Piggyback:140] Out: 975 [Urine:350; Stool:625] Nutritional status: Diet NPO time specified  Mental Status:  Lying in bed with eyes partially open. No volitional responses to any stimuli. No attempts to communicate.  Cranial Nerves: Unchanged. Blinks intermittently. Face flaccidly symmetric.  Motor: No seizure like activity observed.    Lab Results: Results for orders placed or performed during the hospital encounter of 05/28/17 (from the past 48 hour(s))  Glucose, capillary     Status: Abnormal   Collection Time: 06/18/17 12:24 PM  Result Value Ref Range   Glucose-Capillary 112 (H) 65 - 99 mg/dL  Glucose, capillary     Status: None   Collection Time: 06/18/17  4:09 PM  Result Value Ref Range   Glucose-Capillary 99 65 - 99 mg/dL  Glucose, capillary     Status: Abnormal   Collection Time: 06/18/17  8:23 PM  Result Value Ref Range   Glucose-Capillary 117 (H) 65 - 99 mg/dL  Glucose, capillary     Status: Abnormal   Collection Time: 06/18/17 11:58 PM  Result Value Ref Range   Glucose-Capillary 116 (H) 65 - 99 mg/dL  Glucose, capillary     Status:  Abnormal   Collection Time: 06/19/17  3:50 AM  Result Value Ref Range   Glucose-Capillary 112 (H) 65 - 99 mg/dL   Comment 1 Document in Chart   Comprehensive metabolic panel     Status: Abnormal   Collection Time: 06/19/17  5:20 AM  Result Value Ref Range   Sodium 148 (H) 135 - 145 mmol/L   Potassium 4.4 3.5 - 5.1 mmol/L   Chloride 125 (H) 101 - 111 mmol/L   CO2 20 (L) 22 - 32 mmol/L   Glucose, Bld 118 (H) 65 - 99 mg/dL   BUN 31 (H) 6 - 20 mg/dL   Creatinine, Ser 0.78 0.61 - 1.24 mg/dL   Calcium 8.7 (L) 8.9 - 10.3 mg/dL   Total Protein 5.6 (L) 6.5 - 8.1 g/dL   Albumin 2.1 (L) 3.5 - 5.0 g/dL   AST 73 (H) 15 - 41 U/L   ALT 30 17 - 63 U/L   Alkaline Phosphatase 65 38 - 126 U/L   Total Bilirubin 0.7 0.3 - 1.2 mg/dL   GFR calc non Af Amer >60 >60 mL/min   GFR calc Af Amer >60 >60 mL/min    Comment: (NOTE) The eGFR has been calculated using the CKD EPI equation. This calculation has not been validated in all clinical situations. eGFR's persistently <60 mL/min signify possible Chronic Kidney Disease.  Anion gap 3 (L) 5 - 15  CBC with Differential/Platelet     Status: Abnormal   Collection Time: 06/19/17  5:20 AM  Result Value Ref Range   WBC 6.3 4.0 - 10.5 K/uL   RBC 2.54 (L) 4.22 - 5.81 MIL/uL   Hemoglobin 7.7 (L) 13.0 - 17.0 g/dL   HCT 24.1 (L) 39.0 - 52.0 %   MCV 94.9 78.0 - 100.0 fL   MCH 30.3 26.0 - 34.0 pg   MCHC 32.0 30.0 - 36.0 g/dL   RDW 19.3 (H) 11.5 - 15.5 %   Platelets 258 150 - 400 K/uL   Neutrophils Relative % 71 %   Neutro Abs 4.5 1.7 - 7.7 K/uL   Lymphocytes Relative 17 %   Lymphs Abs 1.0 0.7 - 4.0 K/uL   Monocytes Relative 9 %   Monocytes Absolute 0.6 0.1 - 1.0 K/uL   Eosinophils Relative 2 %   Eosinophils Absolute 0.1 0.0 - 0.7 K/uL   Basophils Relative 1 %   Basophils Absolute 0.0 0.0 - 0.1 K/uL  Protime-INR     Status: None   Collection Time: 06/19/17  5:20 AM  Result Value Ref Range   Prothrombin Time 15.2 11.4 - 15.2 seconds   INR 1.21    Glucose, capillary     Status: Abnormal   Collection Time: 06/19/17  7:50 AM  Result Value Ref Range   Glucose-Capillary 115 (H) 65 - 99 mg/dL   Comment 1 Notify RN    Comment 2 Document in Chart   Glucose, capillary     Status: Abnormal   Collection Time: 06/19/17 11:50 AM  Result Value Ref Range   Glucose-Capillary 106 (H) 65 - 99 mg/dL   Comment 1 Notify RN    Comment 2 Document in Chart   Glucose, capillary     Status: Abnormal   Collection Time: 06/19/17  5:08 PM  Result Value Ref Range   Glucose-Capillary 115 (H) 65 - 99 mg/dL  Glucose, capillary     Status: Abnormal   Collection Time: 06/19/17  8:51 PM  Result Value Ref Range   Glucose-Capillary 110 (H) 65 - 99 mg/dL  Glucose, capillary     Status: Abnormal   Collection Time: 06/20/17 12:01 AM  Result Value Ref Range   Glucose-Capillary 116 (H) 65 - 99 mg/dL   Comment 1 Notify RN   Glucose, capillary     Status: Abnormal   Collection Time: 06/20/17  4:02 AM  Result Value Ref Range   Glucose-Capillary 105 (H) 65 - 99 mg/dL   Comment 1 Notify RN   Basic metabolic panel     Status: Abnormal   Collection Time: 06/20/17  4:05 AM  Result Value Ref Range   Sodium 150 (H) 135 - 145 mmol/L   Potassium 4.5 3.5 - 5.1 mmol/L   Chloride 126 (H) 101 - 111 mmol/L   CO2 21 (L) 22 - 32 mmol/L   Glucose, Bld 117 (H) 65 - 99 mg/dL   BUN 30 (H) 6 - 20 mg/dL   Creatinine, Ser 0.71 0.61 - 1.24 mg/dL   Calcium 8.6 (L) 8.9 - 10.3 mg/dL   GFR calc non Af Amer >60 >60 mL/min   GFR calc Af Amer >60 >60 mL/min    Comment: (NOTE) The eGFR has been calculated using the CKD EPI equation. This calculation has not been validated in all clinical situations. eGFR's persistently <60 mL/min signify possible Chronic Kidney Disease.    Anion gap 3 (  L) 5 - 15  Glucose, capillary     Status: Abnormal   Collection Time: 06/20/17  8:20 AM  Result Value Ref Range   Glucose-Capillary 111 (H) 65 - 99 mg/dL    No results found for this or any  previous visit (from the past 240 hour(s)).  Lipid Panel No results for input(s): CHOL, TRIG, HDL, CHOLHDL, VLDL, LDLCALC in the last 72 hours.  Studies/Results: Dg Chest Port 1 View  Result Date: 06/20/2017 CLINICAL DATA:  Followup pneumonia EXAM: PORTABLE CHEST 1 VIEW COMPARISON:  06/19/2017 FINDINGS: Tracheostomy remains in place. Nasogastric tube enters the abdomen. Right internal jugular central line tip at the SVC RA junction. Persistent right effusion with right lower lobe collapse. Mild atelectasis at the left base. No change since yesterday. IMPRESSION: No change. Right effusion with right lower lung collapse. Mild atelectasis left base. Electronically Signed   By: Nelson Chimes M.D.   On: 06/20/2017 08:34   Dg Chest Port 1 View  Result Date: 06/19/2017 CLINICAL DATA:  Followup respiratory failure. EXAM: PORTABLE CHEST 1 VIEW COMPARISON:  06/18/2017 FINDINGS: Tracheostomy, nasogastric tube and right internal jugular central line are unchanged. Persistent bilateral lower lobe infiltrate and volume loss, not visibly changed since yesterday. Slow trend of improvement since films before that. IMPRESSION: Persistent, slowly improving bilateral lower lobe infiltrate and volume loss. Electronically Signed   By: Nelson Chimes M.D.   On: 06/19/2017 08:24    Medications:  Scheduled: . amiodarone  300 mg Oral Daily  . chlorhexidine gluconate (MEDLINE KIT)  15 mL Mouth Rinse BID  . Chlorhexidine Gluconate Cloth  6 each Topical Daily  . folic acid  1 mg Per Tube Daily  . free water  400 mL Per Tube Q8H  . lactulose  10 g Oral Daily  . levothyroxine  50 mcg Per Tube QAC breakfast  . mouth rinse  15 mL Mouth Rinse QID  . metoprolol tartrate  50 mg Per NG tube BID  . pantoprazole sodium  40 mg Per Tube Daily  . phenytoin (DILANTIN) IV  100 mg Intravenous Q12H  . pregabalin  75 mg Per Tube BID  . scopolamine  1 patch Transdermal Q72H  . sodium chloride flush  10-40 mL Intracatheter Q12H  .  sulfamethoxazole-trimethoprim  1 tablet Per Tube Q12H  . thiamine  100 mg Per Tube Daily   Continuous: . ampicillin-sulbactam (UNASYN) IV 1.5 g (06/20/17 1046)  . feeding supplement (VITAL AF 1.2 CAL) 1,000 mL (06/20/17 0311)  . lacosamide (VIMPAT) IV Stopped (06/20/17 0933)  . levETIRAcetam Stopped (06/20/17 0932)  .  sodium bicarbonate  infusion 1000 mL 50 mL/hr at 06/20/17 09378    A/R: 76 year old male, history of old stroke, presented with partial status epilepticus. He was in burst suppression for over 3 days and required multiple antiepileptics to control his partial seizures. 1. Continue Vimpat at 50 mg BID, Keppra at 1000 mg BID and Dilantin 100 mg BID. Most recent EEG on 9/10 showed no electrographic seizures.  2. Multiple regions of chronic infarction and gliosis on his MRI. Faint cortical signal abnormality on DWI appears most consistent with residual cytotoxic edema from ongoing seizures, which are now resolved based upon EEG from 9/10.  3. Long term prognosis for a meaningful neurological recovery is poor.    LOS: 22 days   @Electronically  signed: Dr. Kerney Elbe 06/20/2017  11:12 AM

## 2017-06-21 ENCOUNTER — Telehealth: Payer: Self-pay

## 2017-06-21 ENCOUNTER — Ambulatory Visit: Payer: Medicare Other | Admitting: Neurology

## 2017-06-21 DIAGNOSIS — Z515 Encounter for palliative care: Secondary | ICD-10-CM

## 2017-06-21 DIAGNOSIS — Z7189 Other specified counseling: Secondary | ICD-10-CM

## 2017-06-21 LAB — COMPREHENSIVE METABOLIC PANEL
ALBUMIN: 1.9 g/dL — AB (ref 3.5–5.0)
ALK PHOS: 69 U/L (ref 38–126)
ALT: 27 U/L (ref 17–63)
AST: 58 U/L — AB (ref 15–41)
Anion gap: 5 (ref 5–15)
BUN: 26 mg/dL — ABNORMAL HIGH (ref 6–20)
CALCIUM: 8.3 mg/dL — AB (ref 8.9–10.3)
CO2: 22 mmol/L (ref 22–32)
CREATININE: 0.69 mg/dL (ref 0.61–1.24)
Chloride: 121 mmol/L — ABNORMAL HIGH (ref 101–111)
GFR calc non Af Amer: >60 mL/min (ref >60)
GLUCOSE: 116 mg/dL — AB (ref 65–99)
Potassium: 3.9 mmol/L (ref 3.5–5.1)
SODIUM: 148 mmol/L — AB (ref 135–145)
Total Bilirubin: 1.1 mg/dL (ref 0.3–1.2)
Total Protein: 5.4 g/dL — ABNORMAL LOW (ref 6.5–8.1)

## 2017-06-21 LAB — RENAL FUNCTION PANEL
ALBUMIN: 1.8 g/dL — AB (ref 3.5–5.0)
ANION GAP: 2 — AB (ref 5–15)
BUN: 26 mg/dL — ABNORMAL HIGH (ref 6–20)
CALCIUM: 8.1 mg/dL — AB (ref 8.9–10.3)
CO2: 23 mmol/L (ref 22–32)
Chloride: 118 mmol/L — ABNORMAL HIGH (ref 101–111)
Creatinine, Ser: 0.69 mg/dL (ref 0.61–1.24)
GFR calc non Af Amer: >60 mL/min (ref >60)
Glucose, Bld: 103 mg/dL — ABNORMAL HIGH (ref 65–99)
PHOSPHORUS: 3.4 mg/dL (ref 2.5–4.6)
Potassium: 3.8 mmol/L (ref 3.5–5.1)
SODIUM: 143 mmol/L (ref 135–145)

## 2017-06-21 LAB — CBC WITH DIFFERENTIAL/PLATELET
BASOS PCT: 0 %
Basophils Absolute: 0 10*3/uL (ref 0.0–0.1)
EOS ABS: 0.2 10*3/uL (ref 0.0–0.7)
Eosinophils Relative: 3 %
HEMATOCRIT: 24.6 % — AB (ref 39.0–52.0)
HEMOGLOBIN: 7.6 g/dL — AB (ref 13.0–17.0)
LYMPHS ABS: 0.7 10*3/uL (ref 0.7–4.0)
Lymphocytes Relative: 12 %
MCH: 30.2 pg (ref 26.0–34.0)
MCHC: 30.9 g/dL (ref 30.0–36.0)
MCV: 97.6 fL (ref 78.0–100.0)
MONO ABS: 0.7 10*3/uL (ref 0.1–1.0)
MONOS PCT: 13 %
NEUTROS PCT: 72 %
Neutro Abs: 4.1 10*3/uL (ref 1.7–7.7)
Platelets: 244 10*3/uL (ref 150–400)
RBC: 2.52 MIL/uL — ABNORMAL LOW (ref 4.22–5.81)
RDW: 19.2 % — AB (ref 11.5–15.5)
WBC: 5.6 10*3/uL (ref 4.0–10.5)

## 2017-06-21 LAB — GLUCOSE, CAPILLARY
GLUCOSE-CAPILLARY: 98 mg/dL (ref 65–99)
GLUCOSE-CAPILLARY: 97 mg/dL (ref 65–99)
GLUCOSE-CAPILLARY: 107 mg/dL — AB (ref 65–99)
Glucose-Capillary: 106 mg/dL — ABNORMAL HIGH (ref 65–99)
Glucose-Capillary: 119 mg/dL — ABNORMAL HIGH (ref 65–99)
Glucose-Capillary: 124 mg/dL — ABNORMAL HIGH (ref 65–99)

## 2017-06-21 LAB — MAGNESIUM: MAGNESIUM: 1.8 mg/dL (ref 1.7–2.4)

## 2017-06-21 MED ORDER — FREE WATER
600.0000 mL | Freq: Three times a day (TID) | Status: DC
  Administered 2017-06-21 – 2017-06-24 (×8): 600 mL

## 2017-06-21 MED ORDER — LACOSAMIDE 50 MG PO TABS
50.0000 mg | ORAL_TABLET | Freq: Two times a day (BID) | ORAL | Status: DC
  Administered 2017-06-21 – 2017-06-24 (×6): 50 mg via ORAL
  Filled 2017-06-21 (×6): qty 1

## 2017-06-21 MED ORDER — PHENYTOIN 50 MG PO CHEW
100.0000 mg | CHEWABLE_TABLET | Freq: Two times a day (BID) | ORAL | Status: DC
  Administered 2017-06-21 – 2017-06-24 (×6): 100 mg via ORAL
  Filled 2017-06-21 (×7): qty 2

## 2017-06-21 MED ORDER — LEVETIRACETAM 500 MG PO TABS
1000.0000 mg | ORAL_TABLET | Freq: Two times a day (BID) | ORAL | Status: DC
  Administered 2017-06-21 – 2017-06-24 (×6): 1000 mg via ORAL
  Filled 2017-06-21 (×7): qty 2

## 2017-06-21 NOTE — Progress Notes (Signed)
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  Intubated for airway.  Failed weaning and required trach.  Hx of ETOH and Hep C with cirrhosis, heroin abuse, hepatic encephalopathy, GIB, thrombocytopenia, non ischemic CM.  Subjective: Failed PSV wean 9/14 due to apnea.  Remains on full support AM 9/17.  Intermittently able to follow some basic commands (wigles right sided fingers and right toes).  Vital signs: BP 101/67 (BP Location: Left Arm)   Pulse (!) 109   Temp 99.3 F (37.4 C) (Oral)   Resp 18   Ht 5\' 11"  (1.803 m)   Wt 90.7 kg (199 lb 15.3 oz)   SpO2 100%   BMI 27.89 kg/m   Intake/output:  Intake/Output Summary (Last 24 hours) at 06/21/17 0925 Last data filed at 06/21/17 0500  Gross per 24 hour  Intake          3364.83 ml  Output             1700 ml  Net          1664.83 ml     General appearance:  Chronically ill appearing male, in NAD Eyes: anicteric sclerae, moist conjunctivae; PERRL Mouth: moist membranes and no mucosal ulcerations; normal hard and soft palate Neck: Trachea midline; neck supple, no JVD, trach site C/D/I through remains erythematous Lungs/chest: Clear bilaterally CV: S1, S2,  RRR, no MRGs  Abdomen: Soft, non-tender; non-distended , BS + Extremities: No peripheral edema or extremity lymphadenopathy Skin: Normal temperature, turgor and texture; no rash, ulcers or subcutaneous nodules, no lesions   CMP Latest Ref Rng & Units 06/21/2017 06/20/2017 06/19/2017  Glucose 65 - 99 mg/dL 116(H) 117(H) 118(H)  BUN 6 - 20 mg/dL 26(H) 30(H) 31(H)  Creatinine 0.61 - 1.24 mg/dL 0.69 0.71 0.78  Sodium 135 - 145 mmol/L 148(H) 150(H) 148(H)  Potassium 3.5 - 5.1 mmol/L 3.9 4.5 4.4  Chloride 101 - 111 mmol/L 121(H) 126(H) 125(H)  CO2 22 - 32 mmol/L 22 21(L) 20(L)  Calcium 8.9 - 10.3 mg/dL 8.3(L) 8.6(L) 8.7(L)  Total Protein 6.5 - 8.1 g/dL 5.4(L) - 5.6(L)   Total Bilirubin 0.3 - 1.2 mg/dL 1.1 - 0.7  Alkaline Phos 38 - 126 U/L 69 - 65  AST 15 - 41 U/L 58(H) - 73(H)  ALT 17 - 63 U/L 27 - 30    CBC Latest Ref Rng & Units 06/21/2017 06/19/2017 06/17/2017  WBC 4.0 - 10.5 K/uL 5.6 6.3 6.2  Hemoglobin 13.0 - 17.0 g/dL 7.6(L) 7.7(L) 7.4(L)  Hematocrit 39.0 - 52.0 % 24.6(L) 24.1(L) 22.7(L)  Platelets 150 - 400 K/uL 244 258 212    ABG    Component Value Date/Time   PHART 7.397 06/09/2017 0342   PCO2ART 27.2 (L) 06/09/2017 0342   PO2ART 71.0 (L) 06/09/2017 0342   HCO3 16.9 (L) 06/09/2017 0342   TCO2 18 (L) 06/09/2017 0342   ACIDBASEDEF 7.0 (H) 06/09/2017 0342   O2SAT 95.0 06/09/2017 0342    CBG (last 3)   Recent Labs  06/20/17 1944 06/21/17 0004 06/21/17 0356  GLUCAP 103* 124* 106*     Dg Chest Port 1 View  Result Date: 06/20/2017 CLINICAL DATA:  Followup pneumonia EXAM: PORTABLE CHEST 1 VIEW COMPARISON:  06/19/2017 FINDINGS: Tracheostomy remains in place. Nasogastric tube enters the abdomen. Right internal jugular central line tip at the SVC RA junction. Persistent right effusion with right lower lobe collapse. Mild  atelectasis at the left base. No change since yesterday. IMPRESSION: No change. Right effusion with right lower lung collapse. Mild atelectasis left base. Electronically Signed   By: Nelson Chimes M.D.   On: 06/20/2017 08:34     Lines/tubes: Lurline Idol 9/05 (JY) >> R IJ CBC 8/28>>   Assessment/plan:  Stroke Encephalopathy  Cirrhosis  Hep C H/o CVA afib  Seizures (status) Compromised airway. Failure to wean s/p tracheostomy. 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 Evolving abscess  Pulm problem list  Trach/vent dependent w/ failure to wean in setting of volume overload, encephalopathy, protein calorie malnutrition and severe deconditioning.  CXR with slight improvement in bilateral small pleural effusions Apnea 9/14 with weaning trials   -he is NOT a candidate for  decannulation -mental status will prevent decannulation - Concern that inability to wean ( Apnea) after successful weaning last 2 days may indicate worsening of neuro status   Interval hx from 9/6 to 9/10 Seen by palliative; patients family indicated that they want all aggressive therapy done including PEG tube. Was in burst suppression for > 3 d that required several sedating meds and anti-convulsants to control partial seizures. This is likely complicating MS.  PCXR personally reviewed: trach good position. Bilateral effusions/atx on 9/10 9/11 weaning. Neck cellulitis/abscess draining purulent fluid. Less swollen; still erythremic but over all looks better 9/14>> Unable to wean 2/2 apnea. CXR with slight improvement in small bilateral effusions EEG from 9/19 showed no electrographic seizures.  Neuro feels prognosis for meaningful recovery remains poor  Plan/rec Re-attempt PSV trials, though doubtful that he will tolerate given mental status Mental status will certainly be barrier to both weaning and eventual vent liberation Cont bactrim for neck cellulitis/abscess, stop date today 9/17 Cont AEDs per neuro  -all other care per primary service   Montey Hora, [facility] Pager: [phone]  or [phone] 06/21/2017, 9:32 AM   STAFF NOTE: I, Merrie Roof, MD FACP have personally reviewed patient's available data, including medical history, events of note, physical examination and test results as part of my evaluation. I have discussed with resident/NP and other care providers such as pharmacist, RN and RRT. In addition, I personally evaluated patient and elicited key findings of:  Resting, no distress on vent support, cta , reduced BS bases, abdo ascites wave, edema, trach cite clean, pcxr which I reviewed showed resolving bibasilr int infiltrates, improved atx, PS weaning 10 succesful, to goal PS 8-5 if able, called and d/w RT goals,  pall care active discussions, no decan at any time, no family in room Lavon Paganini. Titus Mould, MD, [place] Pgr: [facility] 06/21/2017 2:49 PM

## 2017-06-21 NOTE — Telephone Encounter (Signed)
Patient no show for appt today. 

## 2017-06-21 NOTE — Progress Notes (Addendum)
PROGRESS NOTE [facility]   DIO GILLER   HYI:[id number] DOB: 1941/07/26  DOA: 05/28/2017 PCP: Thressa Sheller, MD   Brief Narrative:   84 hepatitis C,  alcohol abuse with cirrhosis,  alcoholic cardiomyopathy and recently diagnosed with seizures  Presented to the hospital with weakness and left arm tremor he was admitted for suspected seizures to the medical floor on 8/24  Subsequently patient on 8/27 developed status epilepticus and altered mental status for which he required intubation fora irway protection and was transferred to the ICU- elevated ammonia levels.  Neurology was consulted, burst suppression was started on 8/28 which lasted for over 3 days.  At one point patient was hypotensive required, pressor for a short period.   Patient has been unable to be weaned off ventilator, tracheostomy was placed on 06/09/2017. Given poor prognosis palliative care was consulted.  MRI of brain and EEG was repeated consistent with seizures.  Patient developed cellulitis of the neck been treated with bactrim   Subjective:  More responsive eyes are open  He does not make any meaningful movement but has clearly improved over the past 3-4 days Palliative care meeting has been done and family remains optimistic about full recovery although do not seem to grasp the gravity of his multiple illnesses I have explained extensively after family requested no discussion of palliative care with Dr. Domingo Cocking that the patient has likely pneumonia, liver disease, atrial fibrillation and is still not completely liberated from the ventilator I do not think they completely understand and remain optimistic despite my attempts at steering them in the direction of discussion regarding goals of care  Assessment & Plan:  Status epilepticus - status post burst suppression -marginally more responsive but not conversant--waxing and waning mentation Neurology following intermittently, AEDS per their  recommendation Off sedation Continue Vimpat 50 q 12, Keppra 1000 q12 and phenytoin 100 q 12-? Convert to orals?--decide after palliative meeting  Phenytoin level noted - 4.7 correcting to~ 7.4 9/14 Repeat Phenytoin T and free level 9/18  Hepatic encephalopathy  Hx Hep C with ETOH cirrhosis  developing met acidosis with hypernatremia-from volume contraction as well as lactulose induced diarr Hypokalemia/Hypomagnesemia  Hypernatremia of 148-->150 K packets 40 tid --stop K 9/16 free water 200 q 8-->400 q8 --->600 9/17 Ammonia levels trending down - continue lactulose via NGT --changed dosing to 10 mg as copious diarrhea as well as acidosis from this  cut the dose of lactulose 30 iii/day -- 20 ii/d 9/13-->10 od 9.16 Dispo difficult as has made some improvement however still has significant metabolic abnormalities in addition to cognitive deficits  Anasarca  Albumin low  Lasix has been on hold since 9/12  negative balance of 3.8 L Placed TED hose on 9/13 Rpt cxr 9/18  Chronic respiratory failure due to encephalopathy -  Aspiration PNa based on cxr 9/16 s/p trach 9/5  Continue scopolamine for secretions  On PSV 10/5 for the most part have discussed with family difficulty weaning off VENT 9/13 CXR 9/16 shows ? R lung opacity- Since 9/16 Unasyn but this is unlikely to make a large difference in terms of outcome  Alcoholic cardiomyopathy/Afib with RVR - HR stable  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, diffuse hypokinesis  Initially was hypotensive treated with pressor - now weaned off  Remains Amio gtt for intermittent Afib with RVR --transitioning to by mouth amiodarone 400 twice a day  On aspirin for anticoagulation for now, poor candidate for NOAC  TCP 112--->164  Cardiology saw the  patient and felt reasonable to keep amiodarone 200 daily for now  increased metoprolol 50 twice a day 9./14  -patient is slightly hypotensive--increasing amio 200-300 daily  Anemia of critical illness  & Thrombocytopenia   likely for cirrhosis and acute illness  Hemoglobin is now 7.6, Normocytic No dark stools [has flexiseal] stable  Neck cellulitis  Trach site with mild erythema, and some purulence  Patient has been started on Bactrim -->end date 9/17  Goals of care  Patient remains full code, eventually will need PEG tube?  Severe protein energy malnutritionsecondary to critical illness Weight continues to drop and he is now 199 down from 205 pounds  DVT prophylaxis: SCD's  Code Status: FULL  Family Communication: None at bedside --see discussion from my note earlier today 9/14 Called to d/w son Hilliard Clark today 9/16 Will follow with pallaitve care meeting tomorrow 9/17 Patient has made some improvements andwas able to grasp patient's son's hand and was able to blink according to what the nurse tells me Dispose extremely difficult and he will probably need PEG tube consult  disposition Plan: TBD --he is not a candidate for long-term acute care and will be need skilled facility placement   Consultants:   PCCM   Neurology   Procedures:   Rt IJ 8/28>>  Trach 9/5   Vent Mode: CPAP;PSV FiO2 (%):  [35 %] 35 % Set Rate:  [16 bmp] 16 bmp Vt Set:  [600 mL] 600 mL PEEP:  [5 cmH20] 5 cmH20 Pressure Support:  [10 cmH20] 10 cmH20 Plateau Pressure:  [14 cmH20-19 cmH20] 19 cmH20   Antimicrobials: Anti-infectives    Start     Dose/Rate Route Frequency Ordered Stop   06/20/17 0945  ampicillin-sulbactam (UNASYN) 1.5 g in sodium chloride 0.9 % 50 mL IVPB     1.5 g 100 mL/hr over 30 Minutes Intravenous Every 6 hours 06/20/17 0943     06/15/17 2200  sulfamethoxazole-trimethoprim (BACTRIM DS,SEPTRA DS) 800-160 MG per tablet 1 tablet     1 tablet Per Tube Every 12 hours 06/15/17 1052 06/21/17 2359   06/13/17 1000  sulfamethoxazole-trimethoprim (BACTRIM,SEPTRA) 400-80 MG per tablet 1 tablet  Status:  Discontinued     1 tablet Oral Every 12 hours 06/13/17 0857 06/15/17 1052       Objective: Vitals:   06/21/17 0415 06/21/17 0455 06/21/17 0700 06/21/17 0904  BP: 102/67  101/67   Pulse:   (!) 109   Resp: 19  18   Temp:   99.3 F (37.4 C)   TempSrc:   Oral   SpO2: 100%  100% 100%  Weight:  90.7 kg (199 lb 15.3 oz)    Height:        Intake/Output Summary (Last 24 hours) at 06/21/17 1025 Last data filed at 06/21/17 0500  Gross per 24 hour  Intake          2944.83 ml  Output             1350 ml  Net          1594.83 ml   Filed Weights   06/19/17 0800 06/20/17 0405 06/21/17 0455  Weight: 92.3 kg (203 lb 7.8 oz) 90.2 kg (198 lb 13.7 oz) 90.7 kg (199 lb 15.3 oz)    Examination:  General: nonverbal however eyes open, deconditioned --Arcus senilis trach collar in place and on pressure support as above EENT: Trach in place, mild erythema Cardiovascular: Irr Irr, O9G2, + systolic murmur heart rate 100s Respiratory: on vent poor exam  Abdominal: Soft, NT, ND Neuro: Not responsive to verbal, tactile and painful stimuli.  Extremities: anasarca-some swelling to armsTEDS+ Skin: No rash to lower extremities   Data Reviewed: I have personally reviewed following labs and imaging studies  CBC:  Recent Labs Lab 06/16/17 0425 06/17/17 1030 06/19/17 0520 06/21/17 0444  WBC 7.7 6.2 6.3 5.6  NEUTROABS 6.0 4.8 4.5 4.1  HGB 7.6* 7.4* 7.7* 7.6*  HCT 23.3* 22.7* 24.1* 24.6*  MCV 91.0 91.9 94.9 97.6  PLT 164 212 258 130   Basic Metabolic Panel:  Recent Labs Lab 06/16/17 0425 06/17/17 0357 06/17/17 1201 06/18/17 0346 06/18/17 0900 06/19/17 0520 06/20/17 0405 06/21/17 0444  NA 142 145  --  149*  --  148* 150* 148*  K 3.0* 3.3*  --  3.9  --  4.4 4.5 3.9  CL 117* 117*  --  123*  --  125* 126* 121*  CO2 22 22  --  22  --  20* 21* 22  GLUCOSE 129* 123*  --  131*  --  118* 117* 116*  BUN 29* 31*  --  32*  --  31* 30* 26*  CREATININE 0.77 0.78  --  0.77  --  0.78 0.71 0.69  CALCIUM 8.6* 8.7*  --  8.7*  --  8.7* 8.6* 8.3*  MG 1.9 1.8 1.8  --  2.0  --   --   1.8   GFR: Estimated Creatinine Clearance: 90.6 mL/min (by C-G formula based on SCr of 0.69 mg/dL). Liver Function Tests:  Recent Labs Lab 06/16/17 0425 06/18/17 0346 06/19/17 0520 06/21/17 0444  AST 42*  --  73* 58*  ALT 17  --  30 27  ALKPHOS 63  --  65 69  BILITOT 0.7  --  0.7 1.1  PROT 5.4*  --  5.6* 5.4*  ALBUMIN 2.1* 2.0* 2.1* 1.9*   No results for input(s): LIPASE, AMYLASE in the last 168 hours.  Recent Labs Lab 06/15/17 1925 06/16/17 0425  AMMONIA 58* 38*   Coagulation Profile:  Recent Labs Lab 06/19/17 0520  INR 1.21   Cardiac Enzymes: No results for input(s): CKTOTAL, CKMB, CKMBINDEX, TROPONINI in the last 168 hours. BNP (last 3 results) No results for input(s): PROBNP in the last 8760 hours. HbA1C: No results for input(s): HGBA1C in the last 72 hours. CBG:  Recent Labs Lab 06/20/17 1253 06/20/17 1620 06/20/17 1944 06/21/17 0004 06/21/17 0356  GLUCAP 111* 112* 103* 124* 106*   Lipid Profile: No results for input(s): CHOL, HDL, LDLCALC, TRIG, CHOLHDL, LDLDIRECT in the last 72 hours. Thyroid Function Tests: No results for input(s): TSH, T4TOTAL, FREET4, T3FREE, THYROIDAB in the last 72 hours. Anemia Panel: No results for input(s): VITAMINB12, FOLATE, FERRITIN, TIBC, IRON, RETICCTPCT in the last 72 hours. Sepsis Labs: No results for input(s): PROCALCITON, LATICACIDVEN in the last 168 hours.  No results found for this or any previous visit (from the past 240 hour(s)).    Radiology Studies: Dg Chest Port 1 View  Result Date: 06/20/2017 CLINICAL DATA:  Followup pneumonia EXAM: PORTABLE CHEST 1 VIEW COMPARISON:  06/19/2017 FINDINGS: Tracheostomy remains in place. Nasogastric tube enters the abdomen. Right internal jugular central line tip at the SVC RA junction. Persistent right effusion with right lower lobe collapse. Mild atelectasis at the left base. No change since yesterday. IMPRESSION: No change. Right effusion with right lower lung  collapse. Mild atelectasis left base. Electronically Signed   By: Nelson Chimes M.D.   On: 06/20/2017 08:34  Scheduled Meds: . amiodarone  300 mg Oral Daily  . chlorhexidine gluconate (MEDLINE KIT)  15 mL Mouth Rinse BID  . Chlorhexidine Gluconate Cloth  6 each Topical Daily  . folic acid  1 mg Per Tube Daily  . free water  600 mL Per Tube Q8H  . lacosamide  50 mg Oral BID  . lactulose  10 g Oral Daily  . levETIRAcetam  1,000 mg Oral BID  . levothyroxine  50 mcg Per Tube QAC breakfast  . mouth rinse  15 mL Mouth Rinse QID  . metoprolol tartrate  50 mg Per NG tube BID  . pantoprazole sodium  40 mg Per Tube Daily  . phenytoin  100 mg Oral BID  . pregabalin  75 mg Per Tube BID  . scopolamine  1 patch Transdermal Q72H  . sodium chloride flush  10-40 mL Intracatheter Q12H  . thiamine  100 mg Per Tube Daily   Continuous Infusions: . ampicillin-sulbactam (UNASYN) IV 1.5 g (06/21/17 1606)  . feeding supplement (VITAL AF 1.2 CAL) 1,000 mL (06/21/17 1126)     LOS: 23 days    Verneita Griffes, MD [facility] [phone]

## 2017-06-21 NOTE — Progress Notes (Signed)
NEUROLOGY PROGRESS NOTE  Subjective Patient seen and examined. No acute overnight events.  Objective Vitals:   06/21/17 0700 06/21/17 0904  BP: 101/67   Pulse: (!) 109   Resp: 18   Temp: 99.3 F (37.4 C)   SpO2: 100% 100%  Gen. exam: Patient on no sedation, laying in bed, eyes closed. HEENT: Normocephalic, atraumatic, tracheostomy in place. Serious: S1-S2 heard, regular rate rhythm Respiratory: Scattered rales Abdomen: Nondistended nontender Extremities: 1+ edema in all 4 extremities. Neurological exam Patient laying in bed with eyes closed. Opens eyes to voice. Nonverbal. He was able to wiggle his right foot to command as well as able to show me a week thumbs up on the right. Pupils equal round reactive to light, visual fields difficult to assess, positive oculocephalics, right facial weakness of the lower face. Motor exam: Able to wiggle toes on the right and show a thumbs up on the right hand. No movement on the left. Sensory: Grimaces to noxious stim on the right. Coordination unable to assess Gait unable to assess  MEDS  Current Facility-Administered Medications:  .  acetaminophen (TYLENOL) solution 650 mg, 650 mg, Per Tube, Q6H PRN, [name], MD, 650 mg at 06/20/17 1051 .  amiodarone (PACERONE) tablet 300 mg, 300 mg, Oral, Daily, Verlon Au, Jai-Gurmukh, MD, 300 mg at 06/20/17 0924 .  ampicillin-sulbactam (UNASYN) 1.5 g in sodium chloride 0.9 % 50 mL IVPB, 1.5 g, Intravenous, Q6H, Carnella Guadalajara, RPH, Last Rate: 100 mL/hr at 06/21/17 0916, 1.5 g at 06/21/17 0916 .  artificial tears (LACRILUBE) ophthalmic ointment, , Both Eyes, Q3H PRN, Whiteheart, Kathryn A, NP .  chlorhexidine gluconate (MEDLINE KIT) (PERIDEX) 0.12 % solution 15 mL, 15 mL, Mouth Rinse, BID, Sood, Vineet, MD, 15 mL at 06/21/17 0756 .  Chlorhexidine Gluconate Cloth 2 % PADS 6 each, 6 each, Topical, Daily, [name], MD, 6 each at 06/19/17 1000 .  feeding supplement (VITAL AF 1.2 CAL) liquid 1,000  mL, 1,000 mL, Per Tube, Continuous, Patrecia Pour, Christean Grief, MD, Last Rate: 70 mL/hr at 06/20/17 1744, 1,000 mL at 06/20/17 1744 .  folic acid (FOLVITE) tablet 1 mg, 1 mg, Per Tube, Daily, Patrecia Pour, Christean Grief, MD, 1 mg at 06/20/17 [phone] .  free water 400 mL, 400 mL, Per Tube, Q8H, Samtani, Jai-Gurmukh, MD, 400 mL at 06/21/17 0600 .  lacosamide (VIMPAT) 50 mg in sodium chloride 0.9 % 25 mL IVPB, 50 mg, Intravenous, Q12H, Amie Portland, MD, Stopped at 06/20/17 2318 .  lactulose (CHRONULAC) 10 GM/15ML solution 10 g, 10 g, Oral, Daily, Verlon Au, Jai-Gurmukh, MD, 10 g at 06/20/17 0924 .  levETIRAcetam (KEPPRA) 1,000 mg in sodium chloride 0.9 % 100 mL IVPB, 1,000 mg, Intravenous, Q12H, Amie Portland, MD, Last Rate: 440 mL/hr at 06/21/17 0921, 1,000 mg at 06/21/17 0921 .  levothyroxine (SYNTHROID, LEVOTHROID) tablet 50 mcg, 50 mcg, Per Tube, QAC breakfast, Patrecia Pour, Christean Grief, MD, 50 mcg at 06/20/17 0920 .  MEDLINE mouth rinse, 15 mL, Mouth Rinse, QID, Patrecia Pour, Christean Grief, MD, 15 mL at 06/21/17 0322 .  metoprolol tartrate (LOPRESSOR) tablet 50 mg, 50 mg, Per NG tube, BID, Patwardhan, Manish J, MD, 50 mg at 06/20/17 2159 .  pantoprazole sodium (PROTONIX) 40 mg/20 mL oral suspension 40 mg, 40 mg, Per Tube, Daily, Patrecia Pour, Christean Grief, MD, 40 mg at 06/20/17 0917 .  phenytoin (DILANTIN) injection 100 mg, 100 mg, Intravenous, Q12H, Arrien, Jimmy Picket, MD, 100 mg at 06/20/17 2159 .  pregabalin (LYRICA) capsule 75 mg, 75 mg, Per Tube, BID,  Amie Portland, MD, 75 mg at 06/20/17 2159 .  scopolamine (TRANSDERM-SCOP) 1 MG/3DAYS 1.5 mg, 1 patch, Transdermal, Q72H, Samtani, Jai-Gurmukh, MD, 1.5 mg at 06/20/17 1323 .  sodium chloride flush (NS) 0.9 % injection 10-40 mL, 10-40 mL, Intracatheter, Q12H, [name], MD, 10 mL at 06/20/17 2227 .  sodium chloride flush (NS) 0.9 % injection 10-40 mL, 10-40 mL, Intracatheter, PRN, [name], MD .  sulfamethoxazole-trimethoprim (BACTRIM DS,SEPTRA DS) 800-160 MG per tablet 1  tablet, 1 tablet, Per Tube, Q12H, Nita Sells, MD, 1 tablet at 06/20/17 2159 .  thiamine (VITAMIN B-1) tablet 100 mg, 100 mg, Per Tube, Daily, Patrecia Pour, Christean Grief, MD, 100 mg at 06/20/17 0918  Labs CBC    Component Value Date/Time   WBC 5.6 06/21/2017 0444   RBC 2.52 (L) 06/21/2017 0444   HGB 7.6 (L) 06/21/2017 0444   HGB 13.4 01/01/2017 1535   HCT 24.6 (L) 06/21/2017 0444   HCT 38.9 01/01/2017 1535   PLT 244 06/21/2017 0444   PLT 148 (L) 01/01/2017 1535   MCV 97.6 06/21/2017 0444   MCV 94 01/01/2017 1535   MCH 30.2 06/21/2017 0444   MCHC 30.9 06/21/2017 0444   RDW 19.2 (H) 06/21/2017 0444   RDW 14.4 01/01/2017 1535   LYMPHSABS 0.7 06/21/2017 0444   MONOABS 0.7 06/21/2017 0444   EOSABS 0.2 06/21/2017 0444   BASOSABS 0.0 06/21/2017 0444   CMP     Component Value Date/Time   NA 148 (H) 06/21/2017 0444   NA 144 01/01/2017 1535   K 3.9 06/21/2017 0444   CL 121 (H) 06/21/2017 0444   CO2 22 06/21/2017 0444   GLUCOSE 116 (H) 06/21/2017 0444   BUN 26 (H) 06/21/2017 0444   BUN 6 (L) 01/01/2017 1535   CREATININE 0.69 06/21/2017 0444   CREATININE 0.72 12/13/2015 1226   CALCIUM 8.3 (L) 06/21/2017 0444   PROT 5.4 (L) 06/21/2017 0444   PROT 7.1 01/01/2017 1535   ALBUMIN 1.9 (L) 06/21/2017 0444   ALBUMIN 2.9 (L) 01/01/2017 1535   AST 58 (H) 06/21/2017 0444   ALT 27 06/21/2017 0444   ALKPHOS 69 06/21/2017 0444   BILITOT 1.1 06/21/2017 0444   BILITOT 1.2 01/01/2017 1535   GFRNONAA >60 06/21/2017 0444   GFRAA >60 06/21/2017 0444   Imaging Most recent MRI of the brain done on 06/14/2017 shows gyriform area of signal abnormality in the right cerebral hemisphere indicating possibly edema from seizures, improved from the prior MRI from the last admission. No new stroke.  Assessment 76 year old man with a history of old stroke, who presented with partial status epilepticus. He was burst suppressed for over 3 days and required multiple antiepileptics to control his partial  seizures. Since coming off of his sedation for burst suppression, his exam remained poor and he continued to remain unresponsive for many days. He did have deranged hepatic function and might have taken a prolonged duration to metabolize sedating medications-especially propofol. Currently, his antiepileptic regimen includes lacosamide 50 twice a day, Dilantin 100 mg every 12 hours, pregabalin 75 mg twice a day and levetiracetam 1 g twice a day. My last encounter with him was over a week ago, and his exam today is definitely improved with him following some commands and opening eyes to command.  Impression  Partial status epilepticus-resolved No evidence of underlying anoxic brain injury Persistent encephalopathy-multifactorial  Recommendations #At this time, I would recommend continuing with the current dose of antiepileptics. #Check Dilantin level-pharmacy to assist with dosing.  At  this time, Mr. Dillard continues to be encephalopathic, but improved very mildly from a week ago. He might continue on the current trajectory, but his return to baseline, if that happens at all will be an extremely prolonged journey and may not return back to baseline. From a neurological standpoint, I would recommend continuing the current antiepileptics and managing other toxic metabolic derangements as you are.  Please call neurology as needed.  --  , MD [facility] [phone]  If 7pm to 7am, please call on call as listed on AMION.   

## 2017-06-21 NOTE — Progress Notes (Signed)
Palliative care progress note  Reason for consult: Goals of care in light of ventilator dependent respiratory failure and significant neurologic impairment   I met today with patient's son and mother.  I attempted to explore with family the things that are most important to his father and also to discuss how they will need to continue to evaluate his situation moving forward to continue to assess if he is getting what he would want from his medical care.  His son is very encouraged by the neurologic improvement he sees in his father since he last visited with him. He reports that his father is now opening his eyes, smiling, and interacting with him.  In light of this, his family is clear in their desire to continue with any and all aggressive measures. He specifically stated that they would like to have a PEG tube placed at whenever point it is recommended to do so.  I discussed with Dr. Verlon Au.  Total time: 30 minutes Greater than 50%  of this time was spent counseling and coordinating care related to the above assessment and plan.  Micheline Rough, MD [facility] [phone]

## 2017-06-21 NOTE — Progress Notes (Signed)
[facility] NOTE  Pharmacy Consult for Phenytoin Indication: Seizures  Allergies  Allergen Reactions  . Nsaids Other (See Comments)    Acute gastric ulcers  . Oxycodone Other (See Comments)    Per the risk calculating tool RIOSORD: (Risk Index for Overdose or Serious Opioid -induced Respiratory Depression Risk ) calculated risk in ensuing 6 months  = 83% ( see Problem List for discussion)    Patient Measurements: Height: 5\' 11"  (180.3 cm) Weight: 199 lb 15.3 oz (90.7 kg) IBW/kg (Calculated) : 75.3   Vital Signs: Temp: 99.3 F (37.4 C) (09/17 0700) Temp Source: Oral (09/17 0700) BP: 101/67 (09/17 0700) Pulse Rate: 109 (09/17 0700) Intake/Output from previous day: 09/16 0701 - 09/17 0700 In: 3364.8 [I.V.:720; FU/XN:2355.7; IV Piggyback:480] Out: 2325 [Urine:1500; Stool:825] Intake/Output from this shift: No intake/output data recorded.  Labs:  Recent Labs  06/18/17 0900 06/19/17 0520 06/20/17 0405 06/21/17 0444  WBC  --  6.3  --  5.6  HGB  --  7.7*  --  7.6*  HCT  --  24.1*  --  24.6*  PLT  --  258  --  244  CREATININE  --  0.78 0.71 0.69  MG 2.0  --   --  1.8  ALBUMIN  --  2.1*  --  1.9*  PROT  --  5.6*  --  5.4*  AST  --  73*  --  58*  ALT  --  30  --  27  ALKPHOS  --  65  --  69  BILITOT  --  0.7  --  1.1   Estimated Creatinine Clearance: 90.6 mL/min (by C-G formula based on SCr of 0.69 mg/dL).  Medical History: Past Medical History:  Diagnosis Date  . Abnormal TSH   . Acute gastric ulcer   . Acute gastritis with hemorrhage   . Alcohol abuse   . Alcohol dependence ([place]) 02/02/2014  . Anemia   . Bifascicular block   . Cirrhosis ([place]) 06/04/2012  . Depressive disorder 02/01/2014  . Dizziness and giddiness 12/13/2014  . DVT of lower limb, acute ([place]) 06/06/2012  . DVT, lower extremity, recurrent ([place])    a. noted 2013. b. also dx 06/2016.  Marland Kitchen Essential hypertension   . Fatty liver   . Gallstones   . Gastritis 06/07/2012  . GERD  (gastroesophageal reflux disease)   . GI bleed due to NSAIDs 12/13/2014  . Granulomatous gastritis   . Hematuria 06/04/2012  . Hepatitis C   . Hepatocellular carcinoma ([place])   . Heroin abuse    "I haven't done that since I don't know when."  . Heroin overdose 02/20/2014  . Neuropathy   . NICM (nonischemic cardiomyopathy) ([place])    a. 04/2015: EF 45-50% by cath. b. EF 40-45% by echo 06/2016.  . NSTEMI (non-ST elevated myocardial infarction) ([place])    a. 04/2015 - patent coronaries. Etiology possibly due to coronary spasm versus embolus, stress cardiomyopathy (atypical), and aborted infarction related to plaque rupture with thrombosis and dissolution. Amlodipine started. Not on antiplatelets due to GIB/cirrhosis history.  . Oral thrush 06/05/2012  . Polysubstance abuse    THC, alcohol, heroin  . Prolonged Q-T interval on ECG    a. 12/2014 - treated with magnesium.  . Right knee pain 12/13/2014  . S/P alcohol detoxification 02/02/2014  . Stroke ([place]) 06/2016  . SVT (supraventricular tachycardia) ([place])    a. 12/2014 in setting of GIB, ETOH, NSAIDS, gastritis.  . Symptomatic cholelithiasis 12/15/2013  .  Thrombocytopenia ([place])   . Upper GI bleeding 12/13/2014  . Weight loss 06/04/2012   Assessment: 75yom started on phenytoin 100mg  IV q8 for seizures on 8/28. Level drawn on 9/7 was supratherapeutic and dose reduced to 100mg  IV q12. Doses then held 9/9 due to patient being unresponsive (last dose 9/9 @ 0938) and subsequently resumed at lower dose on 100 mg IV BID on 9/10. No new seizures noted and AED regimen consist of Dilantin, Keppra and Vimpat.   Dilantin level corrected for albumin is ~ 9 on 9/14. No new seizures reported.   Goal of Therapy:  Total phenytoin level 15-20  Plan:  1) Continue phenytoin 100mg  IV q12 2) Will check another dilantin level in next 5-7 days unless clinical course dictates otherwise  3) Consider changing AED's to per tube  Vincenza Hews, PharmD, BCPS 06/21/2017, 8:51  AM

## 2017-06-21 NOTE — Care Management Important Message (Signed)
Important Message  Patient Details  Name: Nathan Frank MRN: [id number] Date of Birth: Feb 08, 1941   Medicare Important Message Given:  Yes    [name] May 06/21/2017, 10:38 AM

## 2017-06-22 ENCOUNTER — Inpatient Hospital Stay (HOSPITAL_COMMUNITY): Payer: Medicare Other

## 2017-06-22 ENCOUNTER — Encounter: Payer: Self-pay | Admitting: Neurology

## 2017-06-22 LAB — GLUCOSE, CAPILLARY
GLUCOSE-CAPILLARY: 105 mg/dL — AB (ref 65–99)
GLUCOSE-CAPILLARY: 120 mg/dL — AB (ref 65–99)
GLUCOSE-CAPILLARY: 86 mg/dL (ref 65–99)
GLUCOSE-CAPILLARY: 99 mg/dL (ref 65–99)
Glucose-Capillary: 104 mg/dL — ABNORMAL HIGH (ref 65–99)
Glucose-Capillary: 101 mg/dL — ABNORMAL HIGH (ref 65–99)
Glucose-Capillary: 97 mg/dL (ref 65–99)

## 2017-06-22 LAB — BASIC METABOLIC PANEL
ANION GAP: 4 — AB (ref 5–15)
BUN: 26 mg/dL — ABNORMAL HIGH (ref 6–20)
CHLORIDE: 118 mmol/L — AB (ref 101–111)
CO2: 22 mmol/L (ref 22–32)
Calcium: 8.1 mg/dL — ABNORMAL LOW (ref 8.9–10.3)
Creatinine, Ser: 0.75 mg/dL (ref 0.61–1.24)
GFR calc non Af Amer: >60 mL/min (ref >60)
Glucose, Bld: 91 mg/dL (ref 65–99)
POTASSIUM: 3.8 mmol/L (ref 3.5–5.1)
SODIUM: 144 mmol/L (ref 135–145)

## 2017-06-22 LAB — PHENYTOIN LEVEL, FREE AND TOTAL
Phenytoin, Free: 0.6 ug/mL — ABNORMAL LOW (ref 1.0–2.0)
Phenytoin, Total: 3.3 ug/mL — ABNORMAL LOW (ref 10.0–20.0)

## 2017-06-22 MED ORDER — SODIUM CHLORIDE 0.9 % IV SOLN
1.5000 g | Freq: Four times a day (QID) | INTRAVENOUS | Status: DC
  Administered 2017-06-22 – 2017-06-24 (×7): 1.5 g via INTRAVENOUS
  Filled 2017-06-22 (×10): qty 1.5

## 2017-06-22 MED ORDER — FUROSEMIDE 40 MG PO TABS
40.0000 mg | ORAL_TABLET | Freq: Every day | ORAL | Status: DC
  Administered 2017-06-22 – 2017-06-24 (×3): 40 mg via ORAL
  Filled 2017-06-22 (×3): qty 1

## 2017-06-22 MED ORDER — FUROSEMIDE 40 MG PO TABS: 40.0000 mg | ORAL_TABLET | Freq: Every day | ORAL | Status: DC

## 2017-06-22 NOTE — Progress Notes (Signed)
IR aware of request for gastrostomy tube placement.   Patient's most recent abdominal imaging was in 06/2016.   Discussed with Dr. Anselm Pancoast.  Obtain abdominal imaging for evaluation of anatomy and possible ascites. Orders in place.   Brynda Greathouse, MS RD PA-C 10:00 AM

## 2017-06-22 NOTE — Care Management Note (Addendum)
Case Management Note  Patient Details  Name: JOSEHUA HAMMAR MRN: [id number] Date of Birth: [birth year]  Subjective/Objective:  From [facility], presents with status epilepticus, hepatic encephalopathy and compromised airway. Hx of CVA, seizures, cirrhosis, non ischemic CM. Has trach/vent, NG tube feeds.   9/12 [place], BSN- per CSW , MD wanted to look at Ltach, per MD notes patient has no meaningful recovery, not appropriate for ltach.  NCM informed MD . Per PCCM he may not come off vent.  Family conts to want aggressive care. CSW following for SNF.    9/18 [place], BSN - discussed in [place], per medical director appropriate for ltach, NCM spoke with son, Orvan [phone], he chose [facility]. Referral given to Melvin with [facility] for ltach.  9/19 [place], BSN - patient has been approved to go to [facility] tomorrow,  MD is aware and RN is aware, NCM left message for patient son, Arvie Bartholomew that patient has been approved and will be dc to [facility] tomorrow.  NCM called back again and son answered , NCM informed him of the above information.                               Action/Plan: NCM will follow along with CSW for dc needs.   Expected Discharge Date:  06/17/17               Expected Discharge Plan:  Long Term Acute Care (LTAC)  In-House Referral:  Clinical Social Work  Discharge planning Services     Post Acute Care Choice:    Choice offered to:     DME Arranged:    DME Agency:     HH Arranged:    [place] Agency:     Status of Service:  In process, will continue to follow  If discussed at Long Length of Stay Meetings, dates discussed:    Additional Comments:  Zenon Mayo, RN 06/22/2017, 12:48 PM

## 2017-06-22 NOTE — Progress Notes (Signed)
Nutrition Follow-up  DOCUMENTATION CODES:   Non-severe (moderate) malnutrition in context of chronic illness  INTERVENTION:    Continue Vital AF 1.2 formula at goal rate of 70 ml/hr  TF regimen to provide 2016 kcals, 126 gm protein, 1362 ml of free water daily  NUTRITION DIAGNOSIS:   Malnutrition (Moderate) related to chronic illness (cirrhosis) as evidenced by mild depletion of body fat, mild depletion of muscle mass, ongoing  GOAL:   Patient will meet greater than or equal to 90% of their needs, met  MONITOR:   TF tolerance, Labs, Weight trends, Skin, I & O's  ASSESSMENT:   Pt from SNF with PMH of substance abuse, alcohol cirrhosis, hepatic encephalopathy, CVA, residual L sided hemiparesis, recent admit for new onset sz. Pt admitted with seizures.   9/5 trach placed  Patient is currently on ventilator support MV: 9.3 L/min Temp (24hrs), Avg:98.4 F (36.9 C), Min:98.2 F (36.8 C), Max:98.7 F (37.1 C)  Pt with seizures, embolic CVA and cirrhosis. Vital AF 1.2 infusing at goal rate of 70 ml/hr via NGT. IR consulted for gastrostomy tube placement.  Medications reviewed and include folvite and Vitamin B 12. Labs and medications reviewed. CBG's [id number].  Disposition: [facility] LT[facility].  Diet Order:  Diet NPO time specified  Skin:  Reviewed, no issues  Last BM:  9/16   Intake/Output Summary (Last 24 hours) at 06/22/17 1526 Last data filed at 06/22/17 0542  Gross per 24 hour  Intake             1450 ml  Output             1300 ml  Net              150 ml   Height:   Ht Readings from Last 1 Encounters:  06/11/17 _0  (1.803 m)   Weight:   Wt Readings from Last 1 Encounters:  06/22/17 215 lb 6.2 oz (97.7 kg)  Admit wt         180 lb (81.6 kg)  Ideal Body Weight:  78.1 kg  BMI:  Body mass index is 30.04 kg/m.  Re-estimated Nutritional Needs:   Kcal:  1900-2100  Protein:  115-130 gm  Fluid:  1.9-2.1 L  EDUCATION NEEDS:   No education needs  identified at this time  Arthur Holms, RD, LDN Pager #: [phone] After-Hours Pager #: [phone]

## 2017-06-22 NOTE — Progress Notes (Signed)
PROGRESS NOTE [facility]   FAVOR HACKLER   KPT:[id number] DOB: Jan 07, 1941  DOA: 05/28/2017 PCP: Thressa Sheller, MD   Brief Narrative:   41 hepatitis C,  alcohol abuse with cirrhosis,  alcoholic cardiomyopathy and recently diagnosed with seizures  Presented to the hospital with weakness and left arm tremor he was admitted for suspected seizures to the medical floor on 8/24  Subsequently patient on 8/27 developed status epilepticus and altered mental status for which he required intubation fora irway protection and was transferred to the ICU- elevated ammonia levels.  Neurology was consulted, burst suppression was started on 8/28 which lasted for over 3 days.  At one point patient was hypotensive required, pressor for a short period.   Patient has been unable to be weaned off ventilator, tracheostomy was placed on 06/09/2017.  Given poor prognosis -she felt like he would be a palliative care candidate and palliative care as an inpatient twice and patient has made marginal improvements in terms of eye flickering and motions with his hand  MRI of brain and EEG was repeated consistent with seizures.  Patient developed cellulitis of the neck been treated with bactrim     Subjective:  Slightly more responsive Now eyes open at all times without menace or noxious stimuli Not able to follow commands however--does not appear speech therapy has been able to see him in the past 48 hours   Assessment & Plan:  Status epilepticus - status post burst suppression  -marginally more responsive  Neurology following intermittently, AEDS per their recommendation--Off sedation Continue Vimpat 50 q 12, Keppra 1000 q12 and phenytoin 100 q 12-all meds transitioned to orally 9/18  Phenytoin level noted - 4.7 correcting to~ 7.4 9/14 Free phenytoin level 0.6, total phenytoin level 3.3 but correction with albumin =14.2   Hepatic encephalopathy  Hx Hep C with ETOH cirrhosis  developing  met acidosis with hypernatremia-from volume contraction as well as lactulose induced diarr Hypokalemia/Hypomagnesemia  Hypernatremia of 148-->150 K packets 40 tid --stop K 9/16 free water 200 q 8-->400 q8 --->600 9/17 Ammonia levels trending down - continue lactulose via NGT --changed dosing to 10 mg as copious diarrhea as well as acidosis from this  cut the dose of lactulose 30 iii/day -- 20 ii/d 9/13-->10 od 9.16 Dispo difficult as has made some improvement however still has significant metabolic abnormalities in addition to cognitive deficits Lactic acidosis from voluminous diarrhea is diminished, sodium now down from 148-144 with free water via eeding tube  Anasarca  Albumin low  Lasix has been on hold since 9/12  negative balance of 3.8 L Placed TED hose on 9/13 Rpt cxr 9/18  Chronic respiratory failure due to encephalopathy -  Aspiration PNa based on cxr 9/16 s/p trach 9/5  Continue scopolamine for secretions  On PSV 10/5 for the most part--has not required PRvC in 72 hoursduring daytime hours CXR 9/16 shows ? R lung opacity- CT has been ordered by interventional radiology given possible PEG tube placement 9/19 I will consolidate aspiration coverage and change to Augmentin and preparation for discharge in next 48 hours Aim to keep patient slightly dry today with Lasix 40 daily 9/18 onwards as x-ray not completely convincing for either pneumonia or fluid may just be atelectasis as patient is on vent  Alcoholic cardiomyopathy/Afib with RVR - HR stable  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, diffuse hypokinesis  Initially was hypotensive treated with pressor - now weaned off  Remains Amio gtt for intermittent Afib with RVR --transitioning  to by mouth amiodarone 400 twice a day  On aspirin for anticoagulation for now, poor candidate for NOAC  TCP 112--->164 Cardiology saw the patient and felt reasonable--it is difficult to control the patient's A. Fib without amiodarone so increased  from 200-300s-->400 on 9/18 Check a.m. Magnesiumand avoid QT prolonging agents  increased metoprolol 50 twice a day 9./14   Anemia of critical illness & Thrombocytopenia   likely for cirrhosis and acute illness  Hemoglobin is now 7.6, Normocytic No dark stools [has flexiseal] stable   Neck cellulitis  Trach site with mild erythema, and some purulence  Patient has been started on Bactrim -->stopped on9/17  Goals of care  Patient remains full code,see above discussion  Severe protein energy malnutritionsecondary to critical illness Weight inconsistent and will need to be weighed daily  DVT prophylaxis: SCD's  Code Status: FULL  Family Communication: None at bedside --see discussion from my note earlier today 9/14 Long discussion at the bedside on 9/17 explained to the patient's son who isproxy healthcare power of attorney That the patient's course will be very long and will be difficult as patient is dealing with not only cardiac and respiratory but also may be infectious processes  I'm not sure how realistic the son is in terms ofprogress and although he has woken up I do not think he will probably have very meaningful life going forward but we will attempt to place him at an LTAC which I understand has been set up andwe will see how he does after placement I have reconciled most of his medications for discharge   disposition Plan: TBD --he is not a candidate for long-term acute care and will be need skilled facility placement   Consultants:   PCCM   Neurology   Procedures:   Rt IJ 8/28>>  Trach 9/5   Vent Mode: CPAP;PSV FiO2 (%):  [28 %-35 %] 28 % Set Rate:  [16 bmp] 16 bmp Vt Set:  [600 mL] 600 mL PEEP:  [5 cmH20] 5 cmH20 Pressure Support:  [8 cmH20] 8 cmH20 Plateau Pressure:  [18 cmH20-22 cmH20] 18 cmH20   Antimicrobials: Anti-infectives    Start     Dose/Rate Route Frequency Ordered Stop   06/22/17 1800  ampicillin-sulbactam (UNASYN) 1.5 g in sodium  chloride 0.9 % 50 mL IVPB     1.5 g 100 mL/hr over 30 Minutes Intravenous Every 6 hours 06/22/17 1218     06/20/17 0945  ampicillin-sulbactam (UNASYN) 1.5 g in sodium chloride 0.9 % 50 mL IVPB  Status:  Discontinued     1.5 g 100 mL/hr over 30 Minutes Intravenous Every 6 hours 06/20/17 0943 06/22/17 1218   06/15/17 2200  sulfamethoxazole-trimethoprim (BACTRIM DS,SEPTRA DS) 800-160 MG per tablet 1 tablet  Status:  Discontinued     1 tablet Per Tube Every 12 hours 06/15/17 1052 06/21/17 1640   06/13/17 1000  sulfamethoxazole-trimethoprim (BACTRIM,SEPTRA) 400-80 MG per tablet 1 tablet  Status:  Discontinued     1 tablet Oral Every 12 hours 06/13/17 0857 06/15/17 1052      Objective: Vitals:   06/22/17 1239 06/22/17 1300 06/22/17 1547 06/22/17 1601  BP: 103/73 110/65 103/62 107/66  Pulse: 91 81 80 87  Resp:  (!) 22 (!) 23 (!) 23  Temp:   98.1 F (36.7 C)   TempSrc:   Axillary   SpO2: 100% 100% 100% 100%  Weight:      Height:        Intake/Output Summary (Last 24 hours) at  06/22/17 1750 Last data filed at 06/22/17 0542  Gross per 24 hour  Intake             1400 ml  Output             1300 ml  Net              100 ml   Filed Weights   06/20/17 0405 06/21/17 0455 06/22/17 0359  Weight: 90.2 kg (198 lb 13.7 oz) 90.7 kg (199 lb 15.3 oz) 97.7 kg (215 lb 6.2 oz)    Examination:  Awake and nonverbal eyes open tracheotomy site seems clean Neck is soft supple S1 and S2 tachycardic low 100 irregular rate rhythm Abdomen soft no rebound Lower extremity edema is trace he has TED hose on I have not examined the integrity of the skin today Neurologically he is about the same as he has been other than eyes open--he does not make any meaningful movement I cannot assess him psychiatrically  Data Reviewed: I have personally reviewed following labs and imaging studies  CBC:  Recent Labs Lab 06/16/17 0425 06/17/17 1030 06/19/17 0520 06/21/17 0444  WBC 7.7 6.2 6.3 5.6  NEUTROABS  6.0 4.8 4.5 4.1  HGB 7.6* 7.4* 7.7* 7.6*  HCT 23.3* 22.7* 24.1* 24.6*  MCV 91.0 91.9 94.9 97.6  PLT 164 212 258 235   Basic Metabolic Panel:  Recent Labs Lab 06/16/17 0425 06/17/17 0357 06/17/17 1201  06/18/17 0900 06/19/17 0520 06/20/17 0405 06/21/17 0444 06/21/17 1910 06/22/17 0504  NA 142 145  --   < >  --  148* 150* 148* 143 144  K 3.0* 3.3*  --   < >  --  4.4 4.5 3.9 3.8 3.8  CL 117* 117*  --   < >  --  125* 126* 121* 118* 118*  CO2 22 22  --   < >  --  20* 21* 22 23 22   GLUCOSE 129* 123*  --   < >  --  118* 117* 116* 103* 91  BUN 29* 31*  --   < >  --  31* 30* 26* 26* 26*  CREATININE 0.77 0.78  --   < >  --  0.78 0.71 0.69 0.69 0.75  CALCIUM 8.6* 8.7*  --   < >  --  8.7* 8.6* 8.3* 8.1* 8.1*  MG 1.9 1.8 1.8  --  2.0  --   --  1.8  --   --   PHOS  --   --   --   --   --   --   --   --  3.4  --   < > = values in this interval not displayed. GFR: Estimated Creatinine Clearance: 93.7 mL/min (by C-G formula based on SCr of 0.75 mg/dL). Liver Function Tests:  Recent Labs Lab 06/16/17 0425 06/18/17 0346 06/19/17 0520 06/21/17 0444 06/21/17 1910  AST 42*  --  73* 58*  --   ALT 17  --  30 27  --   ALKPHOS 63  --  65 69  --   BILITOT 0.7  --  0.7 1.1  --   PROT 5.4*  --  5.6* 5.4*  --   ALBUMIN 2.1* 2.0* 2.1* 1.9* 1.8*   No results for input(s): LIPASE, AMYLASE in the last 168 hours.  Recent Labs Lab 06/15/17 1925 06/16/17 0425  AMMONIA 58* 38*   Coagulation Profile:  Recent Labs Lab 06/19/17 0520  INR 1.21  Cardiac Enzymes: No results for input(s): CKTOTAL, CKMB, CKMBINDEX, TROPONINI in the last 168 hours. BNP (last 3 results) No results for input(s): PROBNP in the last 8760 hours. HbA1C: No results for input(s): HGBA1C in the last 72 hours. CBG:  Recent Labs Lab 06/21/17 2347 06/22/17 0327 06/22/17 0723 06/22/17 1233 06/22/17 1549  GLUCAP 120* 104* 99 86 105*   Lipid Profile: No results for input(s): CHOL, HDL, LDLCALC, TRIG, CHOLHDL,  LDLDIRECT in the last 72 hours. Thyroid Function Tests: No results for input(s): TSH, T4TOTAL, FREET4, T3FREE, THYROIDAB in the last 72 hours. Anemia Panel: No results for input(s): VITAMINB12, FOLATE, FERRITIN, TIBC, IRON, RETICCTPCT in the last 72 hours. Sepsis Labs: No results for input(s): PROCALCITON, LATICACIDVEN in the last 168 hours.  No results found for this or any previous visit (from the past 240 hour(s)).    Radiology Studies: Dg Chest Port 1 View  Result Date: 06/22/2017 CLINICAL DATA:  Pneumonia EXAM: PORTABLE CHEST 1 VIEW COMPARISON:  06/20/2017 FINDINGS: Tracheostomy in good position. Central venous catheter tip at the cavoatrial junction unchanged. NG tube in the stomach. Progression of left lower lobe atelectasis. Right lower lobe airspace disease also mildly progressed. Negative for edema. IMPRESSION: Progression of bibasilar consolidation most likely atelectasis. Electronically Signed   By: Franchot Gallo M.D.   On: 06/22/2017 07:38   Scheduled Meds: . amiodarone  300 mg Oral Daily  . chlorhexidine gluconate (MEDLINE KIT)  15 mL Mouth Rinse BID  . Chlorhexidine Gluconate Cloth  6 each Topical Daily  . folic acid  1 mg Per Tube Daily  . free water  600 mL Per Tube Q8H  . lacosamide  50 mg Oral BID  . lactulose  10 g Oral Daily  . levETIRAcetam  1,000 mg Oral BID  . levothyroxine  50 mcg Per Tube QAC breakfast  . mouth rinse  15 mL Mouth Rinse QID  . metoprolol tartrate  50 mg Per NG tube BID  . pantoprazole sodium  40 mg Per Tube Daily  . phenytoin  100 mg Oral BID  . pregabalin  75 mg Per Tube BID  . scopolamine  1 patch Transdermal Q72H  . sodium chloride flush  10-40 mL Intracatheter Q12H  . thiamine  100 mg Per Tube Daily   Continuous Infusions: . ampicillin-sulbactam (UNASYN) IV 1.5 g (06/22/17 1700)  . feeding supplement (VITAL AF 1.2 CAL) 1,000 mL (06/22/17 1658)     LOS: 24 days    Verneita Griffes, MD [facility] [phone]

## 2017-06-22 NOTE — Progress Notes (Signed)
CSW following for possible vent SNF placement- per palliative notes yesterday patient family wanting to continue care at this time so will need placement.  RNCM exploring possible LTACH and CSW following for Vent SNF if LTACH is not approved  Referrals sent to the following:  [facility], [place]- [address] [place], [place], [place], [place]- [place], [place], [place]- [phone]  [facility] [place], [place]- [place], [place], [place]  Jorge Ny, [place] Social Worker [phone]

## 2017-06-22 NOTE — Progress Notes (Signed)
Pt placed on full vent support prior to transport to CT.  RT assisted in pt transport on vent to CT and returned to 3M07.  Pt required oral and tracheal suctioning while in CT x 2.  Pt's vitals remained stable throughout.

## 2017-06-22 NOTE — Plan of Care (Signed)
Problem: Health Behavior/Discharge Planning: Goal: Ability to manage health-related needs will improve Outcome: Progressing Patient able to vertebralize "thank you" this am after opening his eyes.

## 2017-06-23 ENCOUNTER — Inpatient Hospital Stay (HOSPITAL_COMMUNITY): Payer: Medicare Other

## 2017-06-23 LAB — GLUCOSE, CAPILLARY
GLUCOSE-CAPILLARY: 94 mg/dL (ref 65–99)
GLUCOSE-CAPILLARY: 96 mg/dL (ref 65–99)
Glucose-Capillary: 100 mg/dL — ABNORMAL HIGH (ref 65–99)
Glucose-Capillary: 89 mg/dL (ref 65–99)
Glucose-Capillary: 96 mg/dL (ref 65–99)
Glucose-Capillary: 89 mg/dL (ref 65–99)

## 2017-06-23 MED ORDER — AMIODARONE HCL 200 MG PO TABS
200.0000 mg | ORAL_TABLET | Freq: Every day | ORAL | Status: DC
  Administered 2017-06-24: 200 mg via ORAL
  Filled 2017-06-23: qty 1

## 2017-06-23 MED ORDER — METOPROLOL TARTRATE 50 MG PO TABS: 50.0000 mg | ORAL_TABLET | Freq: Two times a day (BID) | ORAL | 0 refills | Status: AC

## 2017-06-23 MED ORDER — LACOSAMIDE 50 MG PO TABS: 50.0000 mg | ORAL_TABLET | Freq: Two times a day (BID) | ORAL | 0 refills | Status: AC

## 2017-06-23 MED ORDER — AMPICILLIN-SULBACTAM SODIUM 1.5 (1-0.5) G IJ SOLR: 1.5000 g | Freq: Four times a day (QID) | INTRAMUSCULAR | Status: AC

## 2017-06-23 MED ORDER — LACTULOSE 10 GM/15ML PO SOLN: 10.0000 g | Freq: Every day | ORAL | 0 refills | Status: AC

## 2017-06-23 MED ORDER — LEVETIRACETAM 1000 MG PO TABS: ORAL_TABLET | ORAL | 0 refills | Status: AC

## 2017-06-23 MED ORDER — PHENYTOIN 50 MG PO CHEW: 100.0000 mg | CHEWABLE_TABLET | Freq: Two times a day (BID) | ORAL | 0 refills | Status: DC

## 2017-06-23 MED ORDER — FUROSEMIDE 40 MG PO TABS: 40.0000 mg | ORAL_TABLET | Freq: Every day | ORAL | 0 refills | Status: AC

## 2017-06-23 MED ORDER — FREE WATER: 600.0000 mL | Freq: Three times a day (TID) | Status: AC

## 2017-06-23 MED ORDER — AMIODARONE HCL 200 MG PO TABS: 200.0000 mg | ORAL_TABLET | Freq: Every day | ORAL | 0 refills | Status: AC

## 2017-06-23 MED ORDER — THIAMINE HCL 100 MG PO TABS: 100.0000 mg | ORAL_TABLET | Freq: Every day | ORAL | 0 refills | Status: AC

## 2017-06-23 MED ORDER — FOLIC ACID 1 MG PO TABS: 1.0000 mg | ORAL_TABLET | Freq: Every day | ORAL | 0 refills | Status: AC

## 2017-06-23 MED ORDER — SCOPOLAMINE 1 MG/3DAYS TD PT72: 1.0000 | MEDICATED_PATCH | TRANSDERMAL | 12 refills | Status: AC

## 2017-06-23 MED ORDER — CHLORHEXIDINE GLUCONATE 0.12% ORAL RINSE (MEDLINE KIT): 15.0000 mL | Freq: Two times a day (BID) | OROMUCOSAL | 0 refills | Status: AC

## 2017-06-23 MED ORDER — ARTIFICIAL TEARS OPHTHALMIC OINT
TOPICAL_OINTMENT | OPHTHALMIC | Status: AC | PRN
Start: 2017-06-23 — End: ?

## 2017-06-23 MED ORDER — VITAL AF 1.2 CAL PO LIQD: 1000.0000 mL | ORAL | Status: AC

## 2017-06-23 MED ORDER — PREGABALIN 75 MG PO CAPS: 75.0000 mg | ORAL_CAPSULE | Freq: Two times a day (BID) | ORAL | 0 refills | Status: AC

## 2017-06-23 MED ORDER — PANTOPRAZOLE SODIUM 40 MG PO PACK: 40.0000 mg | PACK | Freq: Every day | ORAL | 0 refills | Status: AC

## 2017-06-23 MED ORDER — LEVOTHYROXINE SODIUM 50 MCG PO TABS: 50.0000 ug | ORAL_TABLET | Freq: Every day | ORAL | 0 refills | Status: AC

## 2017-06-23 MED ORDER — ACETAMINOPHEN 160 MG/5ML PO SOLN: 650.0000 mg | Freq: Four times a day (QID) | ORAL | 0 refills | Status: AC | PRN

## 2017-06-23 NOTE — Discharge Summary (Addendum)
Physician Discharge Summary  RAYSHON ALBAUGH HKV:[id number] DOB: [birth year] DOA: 05/28/2017  PCP: Thressa Sheller, MD  Admit date: 05/28/2017 Discharge date: 06/24/2017  Admitted From: home  Disposition:  [facility]  Recommendations for Follow-up:  1.  Awaiting PEG placement 2.  Neurology to follow  Equipment/Devices:  Foley, rectal tube, trach with pressure support 8/5, 30% FIO2  Discharge Condition:  Stable, improved CODE STATUS:  Full code  Diet recommendation:  NPO, Tube feeds via NG    Vital AF 1.2 Cal 17m/h Free water [id number]  Brief/Interim Summary:  [age]o M with hepatitis C, alcohol abuse with cirrhosis, alcoholic cardiomyopathy and recently diagnosed with seizures who presented to the hospital with weakness and left arm tremor.  He was admitted for suspected seizures to the medical floor on 8/24.  On 8/27, he developed status epilepticus and altered mental status requiring intubation for airway protection.  He was transferred to the ICU and Neurology was consulted.  He was placed on burst suppression on 8/28 which lasted 3 days.  He briefly required vasopressor support during that period.  He has subsequently undergone tracheostomy placement and he remains on pressure support.  PEG tube has not yet been placed.  He has remained generally unreponsive but does have some eye opening and occasional hand movements.  He was found to have elevated ammonia levels which have trended down with lactulose.  He has a temporary right IJ TLC, foley, rectal tube, tracheostomy.  His course was complicated by peritracheostomy cellulitis, now resolved after a course of bactrim, and possible aspiration pneumonia, still on treatment with unasyn.  He will complete a course on 9-22.    Discharge Diagnoses:  Principal Problem:   Complex partial status epilepticus (HCRaefordActive Problems:   Dysphagia, post-stroke   Chronic systolic CHF (congestive heart failure) (HCC)   Simple partial seizure  disorder (HCC)   History of CVA (cerebrovascular accident)   History of alcohol abuse   Seizures (HCDennis  Acute encephalopathy   Compromised airway   Aspiration into airway  Status epilepticus - status post burst suppression -  Did not interact with me today, but nurse tech stated patient may be occasionally interactive -  Neurology following intermittently, AEDS per their recommendation--Off sedation -  Continue Vimpat 50 q 12, Keppra 1000 q12 and phenytoin 100 q 12-all meds transitioned to orally 9/18 -  Phenytoin level noted - 4.7 correcting to~ 7.4 9/14 -  Free phenytoin level 0.6, total phenytoin level 3.3 but correction with albumin =14.2  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  -  Ammonia levels trended down, continue lactulose 1067mnce daily  Hypernatremia improving with increased free water - continue free water 600m60mh  Anasarca, Albumin low  Lasix resumed on 9/18  Chronic respiratory failure due to encephalopathy-  Aspiration PNa based on cxr 9/16 s/p trach 9/5  Continue scopolamine for secretions  On PSV 10/5 for the most part CXR 9/16 demonstrated possible R lung opacity Continue Unasyn day 4 of 7, last day on 9/22, [facility] with [facility]VR, CHADS2 vasc of 6, needs anticoagulation  EF 45% w severe TR, diffuse hypokinesis  Appreciate cardiology assistance On aspirin for anticoagulation for now, previously on pradaxa due to retroperitoneal hematoma and pending PEG placement Cardiology recommending tapering amiodarone down and increasing beta blocker as blood pressure tolerates  Prolonged QTC Keep magnesium > 2 and avoid QT prolonging agents  Anemia of critical illness, hemoglobin approximately stable in mid 7g/dl range -  Incidentally seen left retroperitoneal hematoma on CT to evaluate for PEG placement -  No recent iron studies, b12, or folate.   -  TSH 1.314 -   No obvious hemorrhage but no occult stool completed  Thrombocytopenia due to acute illness, resolved.  Neck cellulitis, completed course of bactrim on 9/17  Goals of care, Patient remains full code after palliative care discussions  Severe protein energy malnutrition secondary to critical illness -  Appreciate nutrition assistance  Discharge Instructions     Medication List    STOP taking these medications   dabigatran 150 MG Caps capsule Commonly known as:  PRADAXA   magnesium oxide 400 (241.3 Mg) MG tablet Commonly known as:  MAG-OX   Melatonin 5 MG Caps   Vitamin D-3 5000 units Tabs     TAKE these medications   acetaminophen 160 MG/5ML solution Commonly known as:  TYLENOL Place 20.3 mLs (650 mg total) into feeding tube every 6 (six) hours as needed for fever.   amiodarone 200 MG tablet Commonly known as:  PACERONE Place 1 tablet (200 mg total) into feeding tube daily.   ampicillin-sulbactam 1.5 g in sodium chloride 0.9 % 50 mL Inject 1.5 g into the vein every 6 (six) hours.   artificial tears Oint ophthalmic ointment Commonly known as:  LACRILUBE Place into both eyes every 3 (three) hours as needed for dry eyes.   chlorhexidine gluconate (MEDLINE KIT) 0.12 % solution Commonly known as:  PERIDEX 15 mLs by Mouth Rinse route 2 (two) times daily.   feeding supplement (VITAL AF 1.2 CAL) Liqd Place 1,000 mLs into feeding tube continuous.   folic acid 1 MG tablet Commonly known as:  FOLVITE Place 1 tablet (1 mg total) into feeding tube daily. What changed:  how to take this   free water Soln Place 600 mLs into feeding tube every 8 (eight) hours.   furosemide 40 MG tablet Commonly known as:  LASIX Place 1 tablet (40 mg total) into feeding tube daily.   lacosamide 50 MG Tabs tablet Commonly known as:  VIMPAT Place 1 tablet (50 mg total) into feeding tube 2 (two) times daily.   lactulose 10 GM/15ML solution Commonly known as:  CHRONULAC Place 15 mLs  (10 g total) into feeding tube daily. What changed:  how to take this  when to take this   levETIRAcetam 1000 MG tablet Commonly known as:  KEPPRA 1042m per tube BID What changed:  how much to take  how to take this  when to take this  additional instructions   levothyroxine 50 MCG tablet Commonly known as:  SYNTHROID, LEVOTHROID Place 1 tablet (50 mcg total) into feeding tube daily before breakfast. What changed:  how to take this   metoprolol tartrate 50 MG tablet Commonly known as:  LOPRESSOR 1 tablet (50 mg total) by Per NG tube route 2 (two) times daily. What changed:  medication strength  how much to take  how to take this   pantoprazole sodium 40 mg/20 mL Pack Commonly known as:  PROTONIX Place 20 mLs (40 mg total) into feeding tube daily.   phenytoin 50 MG tablet Commonly known as:  DILANTIN Place 2 tablets (100 mg total) into feeding tube 2 (two) times daily.   pregabalin 75 MG capsule Commonly known as:  LYRICA Place 1 capsule (75 mg total) into feeding tube 2 (two) times daily.   scopolamine 1 MG/3DAYS Commonly known  as:  TRANSDERM-SCOP Place 1 patch (1.5 mg total) onto the skin every 3 (three) days.   thiamine 100 MG tablet Place 1 tablet (100 mg total) into feeding tube daily. What changed:  how to take this      Follow-up Information    Thressa Sheller, MD Follow up.   Specialty:  Internal Medicine Contact information: [place], [address] [place] [address] [phone]          Allergies  Allergen Reactions  . Nsaids Other (See Comments)    Acute gastric ulcers  . Oxycodone Other (See Comments)    Per the risk calculating tool RIOSORD: (Risk Index for Overdose or Serious Opioid -induced Respiratory Depression Risk ) calculated risk in ensuing 6 months  = 83% ( see Problem List for discussion)    Consultations:  [facility] CM  Neurology  Cardiology  Palliative care  Interventional  radiology   Procedures/Studies: Ct Abdomen Wo Contrast  Result Date: 06/22/2017 CLINICAL DATA:  Dysphagia, malnutrition, long-term care, assess for gastrostomy EXAM: CT ABDOMEN WITHOUT CONTRAST TECHNIQUE: Multidetector CT imaging of the abdomen was performed following the standard protocol without IV contrast. COMPARISON:  06/11/2016 FINDINGS: Lower chest: Limited with motion artifact. Small bilateral pleural effusions with bibasilar atelectasis/ consolidation. Mild cardiomegaly. No pericardial effusion. Hepatobiliary: Cirrhotic changes of the liver suspected. Chronic calcified cholelithiasis. Small amount of perihepatic ascites. No biliary obstruction or significant dilatation. Pancreas: Unremarkable. No pancreatic ductal dilatation or surrounding inflammatory changes. Spleen: Normal in size without focal abnormality. Adrenals/Urinary Tract: Artifact from the arm positions across the kidneys. No gross hydronephrosis. Nonobstructing left nephrolithiasis noted. No adrenal abnormality. Stomach/Bowel: NG tube within the stomach. The stomach is anteriorly positioned. Stomach position is amenable to fluoroscopic gastrostomy technique. No large hiatal hernia. Transverse colon is inferior to the stomach. Negative for bowel obstruction. Vascular/Lymphatic: Extensive atherosclerosis. No significant aneurysm. No adenopathy. Left retroperitoneal iliacus muscle hyperdense hematoma noted. This is partially imaged but roughly measures 5 cm, image 62 series 3. Other: Diffuse body anasarca evident.  No large ventral hernia. Musculoskeletal: Degenerative changes of the spine. Mild scoliotic curvature. Bones are osteopenic. No acute osseous finding . IMPRESSION: Stomach position is amenable to fluoroscopic gastrostomy technique. Stomach is anterior within the abdomen, beneath the subcostal margin, and superior to the transverse colon. Hepatic cirrhosis and perihepatic ascites.  Cholelithiasis. Bilateral pleural effusions and  bibasilar atelectasis/consolidation Aortoiliac atherosclerosis 5 cm left iliacus intramuscular hemorrhage, incompletely visualized Diffuse body anasarca Electronically Signed   By: Jerilynn Mages.  Shick M.D.   On: 06/22/2017 21:02   Ct Head Wo Contrast  Result Date: 05/28/2017 CLINICAL DATA:  Extremity weakness. EXAM: CT HEAD WITHOUT CONTRAST TECHNIQUE: Contiguous axial images were obtained from the base of the skull through the vertex without intravenous contrast. COMPARISON:  CT scan of May 12, 2017. FINDINGS: Brain: Encephalomalacia of right MCA territory is noted consistent with old infarction. Old left posterior parietal infarction is noted as well. No mass effect or midline shift is noted. Ventricular size is within normal limits. There is no evidence of mass lesion, hemorrhage or acute infarction. Vascular: No hyperdense vessel or unexpected calcification. Skull: Normal. Negative for fracture or focal lesion. Sinuses/Orbits: No acute finding. Other: None. IMPRESSION: Old large right MCA infarction as described above. No acute intracranial abnormality seen. Electronically Signed   By: Marijo Conception, M.D.   On: 05/28/2017 20:38   Mr Brain Wo Contrast  Result Date: 06/14/2017 CLINICAL DATA:  Initial evaluation for acute altered mental status, history of seizure.  EXAM: MRI HEAD WITHOUT CONTRAST TECHNIQUE: Multiplanar, multiecho pulse sequences of the brain and surrounding structures were obtained without intravenous contrast. COMPARISON:  Prior MRI from 05/29/2017. FINDINGS: Brain: Moderately advanced cerebral atrophy. Underline confluent and patchy/ FLAIR abnormality within the periventricular and deep white matter, most consistent with chronic microvascular ischemic disease. Multifocal areas of encephalomalacia and gliosis involving the anterior right frontal lobe, left parietal lobe, and throughout the right temporoparietal region consistent with remote ischemic infarcts. Extensive laminar necrosis throughout  the remote right temporoparietal infarct. Superimposed remote lacunar infarcts within the right basal ganglia. There is subtly increased gyriform diffusion abnormality within the mesial right temporal lobe, with involvement of the right hippocampus (Series 4, image 15), extension into the parasagittal right frontoparietal region (series 3, image 31), as well as about the right parietal encephalomalacia (series 3, image 34). Findings consistent with changes related to underlying seizure, and are similar in distribution relative to previous MRI, although are much less evident/prominent as compared to previous exam. No new intracranial hemorrhage or infarct. No midline shift, mass effect, or mass lesion. Stable ventricular prominence without hydrocephalus. No extra-axial fluid collection. Vascular: Major intracranial vascular flow voids maintained. Skull and upper cervical spine: Craniocervical junction within normal limits. Visualized upper cervical spine unremarkable. Bone marrow signal intensity normal. No scalp soft tissue abnormality. Sinuses/Orbits: Globes and orbital soft tissues within normal limits. Scattered mucosal thickening within the ethmoidal air cells and maxillary sinuses, with superimposed fluid level in the left maxillary sinus. Fluid seen layering within the posterior nasopharynx. Nasogastric tube in place. Bilateral mastoid effusions noted. Other: None. IMPRESSION: 1. Minimal gyriform diffusion abnormality involving the right cerebral hemisphere, greatest at the mesial right temporal lobe, most consistent with changes related to seizure. Changes are stable in distribution as compared to those seen on recent MRI, although are much less prominent as compared to previous exam. Findings may reflect the residua of previously seen seizure and/or new but more mild seizure activity. 2. No other acute intracranial abnormality. Underlying chronic right greater than left hemispheric encephalomalacia.  Electronically Signed   By: Jeannine Boga M.D.   On: 06/14/2017 21:05   Mr Brain Wo Contrast  Result Date: 05/29/2017 CLINICAL DATA:  76 year old male with weakness for several days. Left extremity tremor. Recent seizure activity. EXAM: MRI HEAD WITHOUT CONTRAST TECHNIQUE: Multiplanar, multiecho pulse sequences of the brain and surrounding structures were obtained without intravenous contrast. COMPARISON:  Head CT without contrast 05/28/2017. Brain MRI 05/12/2017, and earlier. FINDINGS: Study is intermittently degraded by motion artifact despite repeated imaging attempts. Diagnostic axial FLAIR imaging could not be obtained. Brain: Gyriform restricted diffusion throughout much of the posterior hemisphere. Involvement of the posterosuperior right frontal lobe, right parietal lobe, and the right temporal lobe including the right hippocampal formation (series 4, image 16). This diffusion abnormality is new compared to 05/12/2017. No contralateral left hemisphere and no posterior fossa restricted diffusion today. Underlying widespread right hemisphere encephalomalacia, primarily throughout the right MCA territory. Significant right temporal lobe involvement. Posterior left MCA encephalomalacia. Associated bilateral hemisphere hemosiderin. No acute hemorrhage identified. Stable cerebral volume. Stable ventricle size and configuration. No midline shift, mass effect, or evidence of intracranial mass lesion. The thalami I, brainstem, and cerebellum appear stable and within normal limits. Vascular: Major intracranial vascular flow voids are stable. Skull and upper cervical spine: Negative. Normal bone marrow signal. Sinuses/Orbits: Stable and negative. Other: Mastoid air cells remain clear. IMPRESSION: 1. Widespread gyriform restricted diffusion in the right hemisphere including the right hippocampus. This is  most compatible with sequelae of seizure activity. 2. No other acute intracranial abnormality. Underlying  chronic right greater than left hemispheric encephalomalacia. Electronically Signed   By: Genevie Ann M.D.   On: 05/29/2017 01:41   Dg Chest Port 1 View  Result Date: 06/22/2017 CLINICAL DATA:  Pneumonia EXAM: PORTABLE CHEST 1 VIEW COMPARISON:  06/20/2017 FINDINGS: Tracheostomy in good position. Central venous catheter tip at the cavoatrial junction unchanged. NG tube in the stomach. Progression of left lower lobe atelectasis. Right lower lobe airspace disease also mildly progressed. Negative for edema. IMPRESSION: Progression of bibasilar consolidation most likely atelectasis. Electronically Signed   By: Franchot Gallo M.D.   On: 06/22/2017 07:38   Dg Chest Port 1 View  Result Date: 06/20/2017 CLINICAL DATA:  Followup pneumonia EXAM: PORTABLE CHEST 1 VIEW COMPARISON:  06/19/2017 FINDINGS: Tracheostomy remains in place. Nasogastric tube enters the abdomen. Right internal jugular central line tip at the SVC RA junction. Persistent right effusion with right lower lobe collapse. Mild atelectasis at the left base. No change since yesterday. IMPRESSION: No change. Right effusion with right lower lung collapse. Mild atelectasis left base. Electronically Signed   By: Nelson Chimes M.D.   On: 06/20/2017 08:34   Dg Chest Port 1 View  Result Date: 06/19/2017 CLINICAL DATA:  Followup respiratory failure. EXAM: PORTABLE CHEST 1 VIEW COMPARISON:  06/18/2017 FINDINGS: Tracheostomy, nasogastric tube and right internal jugular central line are unchanged. Persistent bilateral lower lobe infiltrate and volume loss, not visibly changed since yesterday. Slow trend of improvement since films before that. IMPRESSION: Persistent, slowly improving bilateral lower lobe infiltrate and volume loss. Electronically Signed   By: Nelson Chimes M.D.   On: 06/19/2017 08:24   Dg Chest Port 1 View  Result Date: 06/18/2017 CLINICAL DATA:  Pneumonia. EXAM: PORTABLE CHEST 1 VIEW COMPARISON:  June 16, 2017. FINDINGS: Unchanged positioning  of the tracheostomy tube, enteric tube, and right internal jugular central venous catheter. Stable cardiomegaly. Small bilateral pleural effusions, possibly slightly improved. Bibasilar atelectasis. No pneumothorax. No acute osseous abnormality. Several healed rib fractures are again noted. IMPRESSION: Small bilateral pleural effusions, possibly slightly improved when compared to prior study. Electronically Signed   By: Titus Dubin M.D.   On: 06/18/2017 08:57   Dg Chest Port 1 View  Result Date: 06/16/2017 CLINICAL DATA:  Pleural effusion EXAM: PORTABLE CHEST 1 VIEW COMPARISON:  06/14/2017 FINDINGS: Cardiac shadow is enlarged but stable. Tracheostomy tube, nasogastric catheter and right jugular central line are again seen and stable. Bilateral pleural effusions are again noted and stable. No new focal infiltrate or pneumothorax is seen. No acute bony abnormality is noted. IMPRESSION: Stable bilateral pleural effusions. Tubes and lines stable in appearance. Electronically Signed   By: Inez Catalina M.D.   On: 06/16/2017 07:55   Dg Chest Port 1 View  Result Date: 06/14/2017 CLINICAL DATA:  Follow-up pleural effusion. History of cirrhosis, chronic CHF, aspiration, former smoker. EXAM: PORTABLE CHEST 1 VIEW COMPARISON:  Portable chest x-ray of June 12, 2017 FINDINGS: The tracheostomy tube tip projects at the inferior margin of the clavicular heads. The lungs are reasonably well inflated. There are bibasilar densities consistent with posterior layering pleural effusions. The cardiac silhouette is enlarged. The central pulmonary vascularity is mildly prominent. The esophagogastric tube tip likely projects below the inferior margin of the image but the inferior aspect is not included in the field of view. The right internal jugular venous catheter tip projects over the distal third of the SVC. IMPRESSION: Bilateral pleural  effusions layering posteriorly. Bibasilar atelectasis or pneumonia is not excluded.  Low-grade CHF. The support tubes are in reasonable position though the tip of the esophagogastric tube cannot be definitely discerned. Electronically Signed   By: David  Martinique M.D.   On: 06/14/2017 07:22   Dg Chest Port 1 View  Result Date: 06/12/2017 CLINICAL DATA:  Pleural effusion. EXAM: PORTABLE CHEST 1 VIEW COMPARISON:  Radiograph of June 09, 2017. FINDINGS: Mild cardiomegaly is noted. Tracheostomy tube is in grossly good position. Nasogastric tube is seen entering the stomach. Right internal jugular catheter is noted with distal tip in expected position of the SVC. No pneumothorax is noted. Stable bibasilar atelectasis or edema is noted with associated pleural effusions. Bony thorax is unremarkable. IMPRESSION: Stable support apparatus. Stable bibasilar opacities as described above. Electronically Signed   By: Marijo Conception, M.D.   On: 06/12/2017 15:03   Dg Chest Port 1 View  Result Date: 06/09/2017 CLINICAL DATA:  Tracheostomy tube placed. EXAM: PORTABLE CHEST 1 VIEW COMPARISON:  Earlier today. FINDINGS: The endotracheal tube has been replaced with a tracheostomy tube in satisfactory position. Nasogastric tube side hole in the mid stomach and tip not included. Right jugular catheter tip in the upper right atrium, lower in position with a decreased inspiration. No significant change in bilateral pleural fluid and bibasilar airspace opacity. Stable borderline enlarged cardiac silhouette. Left mid thoracic spine costovertebral degenerative changes. IMPRESSION: 1. Tracheostomy tube in satisfactory position. 2. No significant change in bilateral pleural effusions and bibasilar atelectasis or pneumonia. Electronically Signed   By: Claudie Revering M.D.   On: 06/09/2017 11:20   Dg Chest Port 1 View  Result Date: 06/09/2017 CLINICAL DATA:  Respiratory failure.  Intubated. EXAM: PORTABLE CHEST 1 VIEW COMPARISON:  06/07/2017. FINDINGS: Endotracheal tube in satisfactory position. Right jugular catheter tip  in the inferior aspect of the superior vena cava. Nasogastric tube extending into the stomach. Stable borderline enlarged cardiac silhouette. Increased ill-defined density at both lung bases, including bilateral pleural effusions, larger on the right. Diffuse osteopenia. IMPRESSION: Increased bilateral pleural fluid and adjacent bibasilar atelectasis or pneumonia. Electronically Signed   By: Claudie Revering M.D.   On: 06/09/2017 07:46   Dg Chest Port 1 View  Result Date: 06/07/2017 CLINICAL DATA:  Acute respiratory failure. EXAM: PORTABLE CHEST 1 VIEW COMPARISON:  One-view chest x-ray 06/06/2017 FINDINGS: Endotracheal tube, NG tube, and right IJ line are stable. Heart size is exaggerated by low lung volumes. Interstitial edema and bilateral effusions are stable. Bibasilar airspace disease likely reflects atelectasis. IMPRESSION: 1. Stable low lung volumes and mild pulmonary vascular congestion. 2. Stable bilateral pleural effusions and bibasilar airspace disease, likely atelectasis. 3. Support apparatus is stable. Electronically Signed   By: San Morelle M.D.   On: 06/07/2017 07:23   Dg Chest Port 1 View  Result Date: 06/06/2017 CLINICAL DATA:  Acute respiratory failure. EXAM: PORTABLE CHEST 1 VIEW COMPARISON:  06/05/2017 FINDINGS: Endotracheal tube, NG tube, and right IJ line are stable. Heart is enlarged. There is no significant change an mild pulmonary vascular congestion and bibasilar atelectasis. Small effusions are likely present. IMPRESSION: 1. Stable low lung volumes and mild pulmonary vascular congestion. 2. Bibasilar airspace disease is unchanged. 3. Support apparatus is stable. Electronically Signed   By: San Morelle M.D.   On: 06/06/2017 07:06   Dg Chest Port 1 View  Result Date: 06/05/2017 CLINICAL DATA:  Respiratory failure. EXAM: PORTABLE CHEST 1 VIEW COMPARISON:  Respiratory failure. FINDINGS: The heart size is exaggerated.  Endotracheal tube, right IJ line, and NG tube are  stable. Bibasilar atelectasis is noted. Pulmonary vascular congestion is unchanged. IMPRESSION: 1. No significant interval change and low lung volumes and bibasilar atelectasis. 2. Mild pulmonary vascular congestion. 3. Support apparatus is stable. Electronically Signed   By: San Morelle M.D.   On: 06/05/2017 08:25   Dg Chest Port 1 View  Result Date: 06/04/2017 CLINICAL DATA:  Respiratory failure. EXAM: PORTABLE CHEST 1 VIEW COMPARISON:  06/03/2017 . FINDINGS: Endotracheal tube, NG tube, right IJ line in stable position. Stable cardiomegaly. Mild left base infiltrate noted on today's exam. Small left pleural effusion . No pneumothorax. IMPRESSION: 1. Lines and tubes in stable position. 2. Mild left base infiltrate noted on today's exam. Pneumonia cannot be excluded. Small left pleural effusion. 3.  Stable cardiomegaly. Electronically Signed   By: Marcello Moores  Register   On: 06/04/2017 07:21   Dg Chest Port 1 View  Result Date: 06/03/2017 CLINICAL DATA:  Stroke, seizures EXAM: PORTABLE CHEST 1 VIEW COMPARISON:  06/02/2017 FINDINGS: Support devices are stable. Mild cardiomegaly and bibasilar atelectasis. Mild vascular congestion. No visible effusions or pneumothorax. IMPRESSION: Mild cardiomegaly and vascular congestion.  Bibasilar atelectasis. Electronically Signed   By: Rolm Baptise M.D.   On: 06/03/2017 07:17   Dg Chest Port 1 View  Result Date: 06/02/2017 CLINICAL DATA:  Respiratory failure EXAM: PORTABLE CHEST 1 VIEW COMPARISON:  06/01/2017 FINDINGS: Endotracheal tube tip just below the clavicular heads. An orogastric tube and side-port reaches the stomach. Right IJ central line with tip at the upper cavoatrial junction. Mild haziness of the left more than right lower chest, likely atelectasis and possibly trace fluid. No pneumothorax or air bronchogram. No pulmonary edema. Stable borderline heart size. IMPRESSION: 1. Stable positioning of tubes and central line. 2. Unchanged mild atelectasis  atelectasis at the bases. Possible trace pleural fluid on the left. Electronically Signed   By: Monte Fantasia M.D.   On: 06/02/2017 08:41   Dg Chest Port 1 View  Result Date: 06/01/2017 CLINICAL DATA:  Right central line placement EXAM: PORTABLE CHEST 1 VIEW COMPARISON:  Portable chest x-ray of 06/01/2017 FINDINGS: A right central venous line is now present with the tip overlying the lower SVC. No pneumothorax is seen. Mild basilar atelectasis is noted left-greater-than-right. Mediastinal and hilar contours are unremarkable. The tip of the endotracheal tube is approximately 4.9 cm above the carina. NG tube extends below the hemidiaphragm. Heart size is stable. A small left pleural effusion cannot be excluded. IMPRESSION: 1. Right central venous line tip overlies the lower SVC. No pneumothorax. 2. Mild basilar volume loss.  Question small left pleural effusion. 3. Tip of endotracheal tube 4.9 cm above the carina. Electronically Signed   By: Ivar Drape M.D.   On: 06/01/2017 10:41   Dg Chest Port 1 View  Result Date: 06/01/2017 CLINICAL DATA:  Check endotracheal tube placement EXAM: PORTABLE CHEST 1 VIEW COMPARISON:  05/31/2017 FINDINGS: Cardiac shadow is at the upper limits of normal in size. Endotracheal tube and nasogastric catheter are noted in satisfactory position. The lungs are well aerated bilaterally. Slight increased density is noted in the left base consistent with atelectasis. No other focal abnormality is noted. IMPRESSION: Minimal left basilar atelectasis. Electronically Signed   By: Inez Catalina M.D.   On: 06/01/2017 08:58   Dg Chest Port 1 View  Result Date: 05/31/2017 CLINICAL DATA:  Aspiration. EXAM: PORTABLE CHEST 1 VIEW COMPARISON:  05/28/2017 FINDINGS: There is no focal parenchymal opacity. There is  no pleural effusion or pneumothorax. The heart and mediastinal contours are unremarkable. The osseous structures are unremarkable. IMPRESSION: No active disease. Electronically Signed    By: Kathreen Devoid   On: 05/31/2017 14:33   Dg Chest Portable 1 View  Result Date: 05/28/2017 CLINICAL DATA:  Weakness and new onset seizures. EXAM: PORTABLE CHEST 1 VIEW COMPARISON:  11/12/2016 FINDINGS: 1946 hours. The cardio pericardial silhouette is enlarged. There is pulmonary vascular congestion without overt pulmonary edema. Probable minimal basilar atelectasis. No edema, focal airspace consolidation, or substantial pleural effusion. Bones are diffusely demineralized. Telemetry leads overlie the chest. IMPRESSION: Cardiomegaly with vascular congestion. Electronically Signed   By: Misty Stanley M.D.   On: 05/28/2017 20:06   Dg Abd Portable 1v  Result Date: 06/23/2017 CLINICAL DATA:  NG tube placement. EXAM: PORTABLE ABDOMEN - 1 VIEW COMPARISON:  None. FINDINGS: Nasogastric tube tip appears to lie along the greater curvature of the stomach. Nonobstructive gas pattern. IMPRESSION: NG tube tip in stomach. Electronically Signed   By: Staci Righter M.D.   On: 06/23/2017 09:07   Dg Abd Portable 1v  Result Date: 06/11/2017 CLINICAL DATA:  NG tube placement EXAM: PORTABLE ABDOMEN - 1 VIEW COMPARISON:  06/07/2017 FINDINGS: Bilateral effusions and bibasilar consolidation. Enlarged heart size. Right-sided central venous catheter tip overlies the cavoatrial junction. Esophageal tube tip overlies the mid stomach, side-port projects slightly beyond the GE junction. IMPRESSION: 1. Esophageal tube tip projects over the mid stomach 2. Bibasilar consolidations and probable effusions Electronically Signed   By: Donavan Foil M.D.   On: 06/11/2017 21:38   Dg Abd Portable 1v  Result Date: 06/07/2017 CLINICAL DATA:  Abdominal ileus. EXAM: PORTABLE ABDOMEN - 1 VIEW COMPARISON:  06/03/2017 FINDINGS: A nasogastric tube projects over the stomach body. Reduced amount of colonic gas compare to prior, although still moderate in the left upper quadrant/splenic flexure region. Suspicion for airspace opacity at the left lung  base. Mild dextroconvex lumbar scoliosis with spondylosis. The lower abdomen is mostly gasless. IMPRESSION: 1. Reduced gaseous prominence of the large bowel. Mostly gasless small bowel. 2. Suspected airspace opacity at the left lung base. 3. Nasogastric tube tip:  Stomach body. Electronically Signed   By: Van Clines M.D.   On: 06/07/2017 08:43   Dg Abd Portable 1v  Result Date: 06/03/2017 CLINICAL DATA:  Hepatic cirrhosis, gastric ulcer, gallstones, GI bleed. EXAM: PORTABLE ABDOMEN - 1 VIEW COMPARISON:  Abdominal radiograph of June 01, 2017 FINDINGS: There is a moderate amount of gas within bowel which appears reflect large bowel. There is mild gaseous distention of a loop of small bowel in the mid abdomen. There is no free extraluminal gas. The esophagogastric tube is no longer teens and the proximal port and tip lie in the gastric body. IMPRESSION: Mild small and large bowel distention which likely reflects an ileus. There is no evidence of perforation. Electronically Signed   By: David  Martinique M.D.   On: 06/03/2017 16:40   Dg Abd Portable 1v  Result Date: 06/01/2017 CLINICAL DATA:  NG tube placement EXAM: PORTABLE ABDOMEN - 1 VIEW COMPARISON:  None. FINDINGS: The NG tube appears kinked in the stomach. Recommend repositioning and repeat radiograph. IMPRESSION: Kinked appearance of the NG tube in the stomach. Repositioning and repeat radiograph may be helpful. Electronically Signed   By: Ulyses Jarred M.D.   On: 06/01/2017 05:46    Subjective: Nonverbal, does not respond to commands or open eyes  Discharge Exam: Vitals:   06/23/17 1600 06/23/17 1620  BP: 100/68   Pulse: 78 98  Resp: (!) 21 (!) 24  Temp: 97.8 F (36.6 C)   SpO2:  100%   Vitals:   06/23/17 1400 06/23/17 1500 06/23/17 1600 06/23/17 1620  BP: (!) 88/63 96/71 100/68   Pulse: 88 85 78 98  Resp: 19 (!) 21 (!) 21 (!) 24  Temp:   97.8 F (36.6 C)   TempSrc:   Oral   SpO2: 100% 100%  100%  Weight:      Height:         General: Pt is asleep, eyes closed, on ventilator with PS 8/5, no acute distress HEENT:  No surrounding erythema near tracheostomy or cuff.  Right IJ in place also without surrounding erythema Cardiovascular: IRRR, S1/S2 +, no rubs, no gallops Respiratory:  rhonchorous breath sounds and coughs slightly while I am in room, no focal rales or wheezes Abdominal: Soft, NT, ND, bowel sounds +.  NG in place but no tube feeds going currently Extremities:  2+ full body edema/arms and legs consistent with anasarca despite bilateral lower extremity TED hose Neuro:  Appears mostly flaccid on the right side and some equivocal Banbinski on the left Foley catheter and rectal tubes in place    The results of significant diagnostics from this hospitalization (including imaging, microbiology, ancillary and laboratory) are listed below for reference.     Microbiology: No results found for this or any previous visit (from the past 240 hour(s)).   Labs: BNP (last 3 results)  Recent Labs  08/20/16 2041  BNP 410.3*   Basic Metabolic Panel:  Recent Labs Lab 06/17/17 0357 06/17/17 1201  06/18/17 0900 06/19/17 0520 06/20/17 0405 06/21/17 0444 06/21/17 1910 06/22/17 0504  NA 145  --   < >  --  148* 150* 148* 143 144  K 3.3*  --   < >  --  4.4 4.5 3.9 3.8 3.8  CL 117*  --   < >  --  125* 126* 121* 118* 118*  CO2 22  --   < >  --  20* 21* 22 23 22   GLUCOSE 123*  --   < >  --  118* 117* 116* 103* 91  BUN 31*  --   < >  --  31* 30* 26* 26* 26*  CREATININE 0.78  --   < >  --  0.78 0.71 0.69 0.69 0.75  CALCIUM 8.7*  --   < >  --  8.7* 8.6* 8.3* 8.1* 8.1*  MG 1.8 1.8  --  2.0  --   --  1.8  --   --   PHOS  --   --   --   --   --   --   --  3.4  --   < > = values in this interval not displayed. Liver Function Tests:  Recent Labs Lab 06/18/17 0346 06/19/17 0520 06/21/17 0444 06/21/17 1910  AST  --  73* 58*  --   ALT  --  30 27  --   ALKPHOS  --  65 69  --   BILITOT  --  0.7 1.1  --    PROT  --  5.6* 5.4*  --   ALBUMIN 2.0* 2.1* 1.9* 1.8*   No results for input(s): LIPASE, AMYLASE in the last 168 hours. No results for input(s): AMMONIA in the last 168 hours. CBC:  Recent Labs Lab 06/17/17 1030 06/19/17 0520 06/21/17 0444  WBC 6.2 6.3 5.6  NEUTROABS 4.8 4.5 4.1  HGB 7.4* 7.7* 7.6*  HCT 22.7* 24.1* 24.6*  MCV 91.9 94.9 97.6  PLT 212 258 244   Cardiac Enzymes: No results for input(s): CKTOTAL, CKMB, CKMBINDEX, TROPONINI in the last 168 hours. BNP: Invalid input(s): POCBNP CBG:  Recent Labs Lab 06/22/17 2009 06/22/17 2324 06/23/17 0346 06/23/17 0830 06/23/17 1237  GLUCAP 101* 97 100* 89 96   D-Dimer No results for input(s): DDIMER in the last 72 hours. Hgb A1c No results for input(s): HGBA1C in the last 72 hours. Lipid Profile No results for input(s): CHOL, HDL, LDLCALC, TRIG, CHOLHDL, LDLDIRECT in the last 72 hours. Thyroid function studies No results for input(s): TSH, T4TOTAL, T3FREE, THYROIDAB in the last 72 hours.  Invalid input(s): FREET3 Anemia work up No results for input(s): VITAMINB12, FOLATE, FERRITIN, TIBC, IRON, RETICCTPCT in the last 72 hours. Urinalysis    Component Value Date/Time   COLORURINE YELLOW 05/29/2017 0300   APPEARANCEUR CLEAR 05/29/2017 0300   LABSPEC 1.010 05/29/2017 0300   PHURINE 8.0 05/29/2017 0300   GLUCOSEU NEGATIVE 05/29/2017 0300   HGBUR NEGATIVE 05/29/2017 0300   BILIRUBINUR NEGATIVE 05/29/2017 0300   KETONESUR 5 (A) 05/29/2017 0300   PROTEINUR NEGATIVE 05/29/2017 0300   UROBILINOGEN 2.0 (H) 12/13/2014 1256   NITRITE NEGATIVE 05/29/2017 0300   LEUKOCYTESUR NEGATIVE 05/29/2017 0300   Sepsis Labs Invalid input(s): PROCALCITONIN,  WBC,  LACTICIDVEN   Time coordinating discharge: Over 30 minutes  SIGNED:   Janece Canterbury, MD  [facility] 06/23/2017, 4:26 PM Pager   If 7PM-7AM, please contact night-coverage [email or web address] Password [email or web address]

## 2017-06-23 NOTE — Progress Notes (Signed)
Pt vomit thick white mucus consistency. After cleaning and turning pt, RN noticed NG tube displaced likely due to pt coughing vigorously.  K. Schorr, NP notified. Will pass on to Day shift RN

## 2017-06-24 DIAGNOSIS — R1312 Dysphagia, oropharyngeal phase: Secondary | ICD-10-CM | POA: Diagnosis not present

## 2017-06-24 DIAGNOSIS — J9600 Acute respiratory failure, unspecified whether with hypoxia or hypercapnia: Secondary | ICD-10-CM | POA: Diagnosis not present

## 2017-06-24 DIAGNOSIS — G9341 Metabolic encephalopathy: Secondary | ICD-10-CM | POA: Diagnosis not present

## 2017-06-24 DIAGNOSIS — I69354 Hemiplegia and hemiparesis following cerebral infarction affecting left non-dominant side: Secondary | ICD-10-CM | POA: Diagnosis not present

## 2017-06-24 DIAGNOSIS — B1920 Unspecified viral hepatitis C without hepatic coma: Secondary | ICD-10-CM | POA: Diagnosis not present

## 2017-06-24 DIAGNOSIS — I452 Bifascicular block: Secondary | ICD-10-CM | POA: Diagnosis not present

## 2017-06-24 DIAGNOSIS — K7031 Alcoholic cirrhosis of liver with ascites: Secondary | ICD-10-CM | POA: Diagnosis not present

## 2017-06-24 DIAGNOSIS — I10 Essential (primary) hypertension: Secondary | ICD-10-CM | POA: Diagnosis not present

## 2017-06-24 DIAGNOSIS — F1020 Alcohol dependence, uncomplicated: Secondary | ICD-10-CM | POA: Diagnosis not present

## 2017-06-24 DIAGNOSIS — Z9911 Dependence on respirator [ventilator] status: Secondary | ICD-10-CM | POA: Diagnosis not present

## 2017-06-24 DIAGNOSIS — E43 Unspecified severe protein-calorie malnutrition: Secondary | ICD-10-CM | POA: Diagnosis not present

## 2017-06-24 DIAGNOSIS — Z4659 Encounter for fitting and adjustment of other gastrointestinal appliance and device: Secondary | ICD-10-CM | POA: Diagnosis not present

## 2017-06-24 DIAGNOSIS — I639 Cerebral infarction, unspecified: Secondary | ICD-10-CM | POA: Diagnosis not present

## 2017-06-24 DIAGNOSIS — Z978 Presence of other specified devices: Secondary | ICD-10-CM

## 2017-06-24 DIAGNOSIS — Z87898 Personal history of other specified conditions: Secondary | ICD-10-CM | POA: Diagnosis not present

## 2017-06-24 DIAGNOSIS — I69398 Other sequelae of cerebral infarction: Secondary | ICD-10-CM | POA: Diagnosis not present

## 2017-06-24 DIAGNOSIS — M6281 Muscle weakness (generalized): Secondary | ICD-10-CM | POA: Diagnosis not present

## 2017-06-24 DIAGNOSIS — J690 Pneumonitis due to inhalation of food and vomit: Secondary | ICD-10-CM | POA: Diagnosis not present

## 2017-06-24 DIAGNOSIS — K567 Ileus, unspecified: Secondary | ICD-10-CM | POA: Diagnosis not present

## 2017-06-24 DIAGNOSIS — R2689 Other abnormalities of gait and mobility: Secondary | ICD-10-CM | POA: Diagnosis not present

## 2017-06-24 DIAGNOSIS — D638 Anemia in other chronic diseases classified elsewhere: Secondary | ICD-10-CM | POA: Diagnosis not present

## 2017-06-24 DIAGNOSIS — G40201 Localization-related (focal) (partial) symptomatic epilepsy and epileptic syndromes with complex partial seizures, not intractable, with status epilepticus: Secondary | ICD-10-CM | POA: Diagnosis not present

## 2017-06-24 DIAGNOSIS — E038 Other specified hypothyroidism: Secondary | ICD-10-CM | POA: Diagnosis not present

## 2017-06-24 DIAGNOSIS — E870 Hyperosmolality and hypernatremia: Secondary | ICD-10-CM | POA: Diagnosis not present

## 2017-06-24 DIAGNOSIS — E039 Hypothyroidism, unspecified: Secondary | ICD-10-CM | POA: Diagnosis not present

## 2017-06-24 DIAGNOSIS — I4891 Unspecified atrial fibrillation: Secondary | ICD-10-CM | POA: Diagnosis not present

## 2017-06-24 DIAGNOSIS — E872 Acidosis: Secondary | ICD-10-CM | POA: Diagnosis not present

## 2017-06-24 DIAGNOSIS — I4581 Long QT syndrome: Secondary | ICD-10-CM | POA: Diagnosis not present

## 2017-06-24 DIAGNOSIS — I110 Hypertensive heart disease with heart failure: Secondary | ICD-10-CM | POA: Diagnosis not present

## 2017-06-24 DIAGNOSIS — J9620 Acute and chronic respiratory failure, unspecified whether with hypoxia or hypercapnia: Secondary | ICD-10-CM | POA: Diagnosis not present

## 2017-06-24 DIAGNOSIS — R1319 Other dysphagia: Secondary | ICD-10-CM | POA: Diagnosis not present

## 2017-06-24 DIAGNOSIS — R1311 Dysphagia, oral phase: Secondary | ICD-10-CM | POA: Diagnosis not present

## 2017-06-24 DIAGNOSIS — R569 Unspecified convulsions: Secondary | ICD-10-CM | POA: Diagnosis not present

## 2017-06-24 DIAGNOSIS — D689 Coagulation defect, unspecified: Secondary | ICD-10-CM | POA: Diagnosis not present

## 2017-06-24 DIAGNOSIS — K661 Hemoperitoneum: Secondary | ICD-10-CM | POA: Diagnosis not present

## 2017-06-24 DIAGNOSIS — D649 Anemia, unspecified: Secondary | ICD-10-CM | POA: Diagnosis not present

## 2017-06-24 DIAGNOSIS — J9690 Respiratory failure, unspecified, unspecified whether with hypoxia or hypercapnia: Secondary | ICD-10-CM | POA: Diagnosis not present

## 2017-06-24 DIAGNOSIS — I5022 Chronic systolic (congestive) heart failure: Secondary | ICD-10-CM | POA: Diagnosis not present

## 2017-06-24 DIAGNOSIS — J9621 Acute and chronic respiratory failure with hypoxia: Secondary | ICD-10-CM | POA: Diagnosis not present

## 2017-06-24 DIAGNOSIS — I6990 Unspecified sequelae of unspecified cerebrovascular disease: Secondary | ICD-10-CM | POA: Diagnosis not present

## 2017-06-24 DIAGNOSIS — K2900 Acute gastritis without bleeding: Secondary | ICD-10-CM | POA: Diagnosis not present

## 2017-06-24 DIAGNOSIS — I4892 Unspecified atrial flutter: Secondary | ICD-10-CM | POA: Diagnosis not present

## 2017-06-24 DIAGNOSIS — J189 Pneumonia, unspecified organism: Secondary | ICD-10-CM | POA: Diagnosis not present

## 2017-06-24 DIAGNOSIS — I482 Chronic atrial fibrillation: Secondary | ICD-10-CM | POA: Diagnosis not present

## 2017-06-24 DIAGNOSIS — K7210 Chronic hepatic failure without coma: Secondary | ICD-10-CM | POA: Diagnosis not present

## 2017-06-24 DIAGNOSIS — I69391 Dysphagia following cerebral infarction: Secondary | ICD-10-CM | POA: Diagnosis not present

## 2017-06-24 DIAGNOSIS — G9340 Encephalopathy, unspecified: Secondary | ICD-10-CM | POA: Diagnosis not present

## 2017-06-24 DIAGNOSIS — K5900 Constipation, unspecified: Secondary | ICD-10-CM | POA: Diagnosis not present

## 2017-06-24 DIAGNOSIS — G40101 Localization-related (focal) (partial) symptomatic epilepsy and epileptic syndromes with simple partial seizures, not intractable, with status epilepticus: Secondary | ICD-10-CM | POA: Diagnosis not present

## 2017-06-24 DIAGNOSIS — Z930 Tracheostomy status: Secondary | ICD-10-CM | POA: Diagnosis not present

## 2017-06-24 DIAGNOSIS — L03221 Cellulitis of neck: Secondary | ICD-10-CM | POA: Diagnosis not present

## 2017-06-24 DIAGNOSIS — I426 Alcoholic cardiomyopathy: Secondary | ICD-10-CM | POA: Diagnosis not present

## 2017-06-24 DIAGNOSIS — I471 Supraventricular tachycardia: Secondary | ICD-10-CM | POA: Diagnosis not present

## 2017-06-24 DIAGNOSIS — Z431 Encounter for attention to gastrostomy: Secondary | ICD-10-CM | POA: Diagnosis not present

## 2017-06-24 DIAGNOSIS — N179 Acute kidney failure, unspecified: Secondary | ICD-10-CM | POA: Diagnosis not present

## 2017-06-24 DIAGNOSIS — K7290 Hepatic failure, unspecified without coma: Secondary | ICD-10-CM | POA: Diagnosis not present

## 2017-06-24 DIAGNOSIS — R1310 Dysphagia, unspecified: Secondary | ICD-10-CM | POA: Diagnosis not present

## 2017-06-24 LAB — GLUCOSE, CAPILLARY
GLUCOSE-CAPILLARY: 84 mg/dL (ref 65–99)
GLUCOSE-CAPILLARY: 100 mg/dL — AB (ref 65–99)
GLUCOSE-CAPILLARY: 108 mg/dL — AB (ref 65–99)

## 2017-06-24 MED ORDER — PHENYTOIN 50 MG PO CHEW: 125.0000 mg | CHEWABLE_TABLET | Freq: Two times a day (BID) | ORAL | 0 refills | Status: AC

## 2017-06-24 MED ORDER — PHENYTOIN 50 MG PO CHEW
125.0000 mg | CHEWABLE_TABLET | Freq: Two times a day (BID) | ORAL | Status: DC
  Filled 2017-06-24: qty 2.5

## 2017-06-24 NOTE — Progress Notes (Signed)
Patient to be transferred to room 204 at [facility]. Report called to [place], RN. This RN will call [facility] for transport.

## 2017-06-24 NOTE — Discharge Summary (Signed)
Physician Discharge Summary  Nathan Frank VEH:[id number] DOB: [birth year] DOA: 05/28/2017  PCP: Thressa Sheller, MD  Admit date: 05/28/2017 Discharge date: 06/24/2017  Admitted From: home  Disposition:  [facility]  Recommendations for Follow-up:  1.  Awaiting PEG placement 2.  Neurology to follow 3.  Repeat phenytoin level in 5-7 days  Equipment/Devices:  Foley, rectal tube, trach with pressure support 8/5, 30% FIO2  Discharge Condition:  Stable, improved CODE STATUS:  Full code  Diet recommendation:  NPO, Tube feeds via NG    Vital AF 1.2 Cal 31m/h Free water [id number]  Brief/Interim Summary:  [name] with hepatitis C, alcohol abuse with cirrhosis, alcoholic cardiomyopathy and recently diagnosed with seizures who presented to the hospital with weakness and left arm tremor.  He was admitted for suspected seizures to the medical floor on 8/24.  On 8/27, he developed status epilepticus and altered mental status requiring intubation for airway protection.  He was transferred to the ICU and Neurology was consulted.  He was placed on burst suppression on 8/28 which lasted 3 days.  He briefly required vasopressor support during that period.  He has subsequently undergone tracheostomy placement and he remains on pressure support.  PEG tube has not yet been placed.  He has remained generally unreponsive but does have some eye opening and occasional hand movements.  He was found to have elevated ammonia levels which have trended down with lactulose.  He has a temporary right IJ TLC, foley, rectal tube, tracheostomy.  His course was complicated by peritracheostomy cellulitis, now resolved after a course of bactrim, and possible aspiration pneumonia, still on treatment with unasyn.  He will complete a course on 9-22.    Discharge Diagnoses:  Principal Problem:   Complex partial status epilepticus ([place] Problems:   Dysphagia, post-stroke   Chronic systolic CHF (congestive heart  failure) (HCC)   Simple partial seizure disorder (HCC)   History of CVA (cerebrovascular accident)   History of alcohol abuse   Seizures ([place]  Acute encephalopathy   Compromised airway   Aspiration into airway  Status epilepticus - status post burst suppression -  Did not interact with me today, but nurse tech stated patient may be occasionally interactive -  Neurology following intermittently, AEDS per their recommendation--Off sedation -  Continue Vimpat 50 q 12, Keppra 1000 q12 -  Phenytoin increased to 12565mid on 9/19 -  Repeat phenytoin level in 5-7 days  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  -  Ammonia levels trended down, continue lactulose 80m45mce daily  Hypernatremia improving with increased free water - continue free water 600mL28m  Anasarca, Albumin low  Lasix resumed on 9/18  Chronic respiratory failure due to encephalopathy-  Aspiration PNa based on cxr 9/16 s/p trach 9/5  Continue scopolamine for secretions  On PSV 10/5 for the most part CXR 9/16 demonstrated possible R lung opacity Continue Unasyn day 4 of 7, last day on 9/22, then [facility] with afib with RVR, CHADS2 vasc of 6, needs anticoagulation  EF 45% w severe TR, diffuse hypokinesis  Appreciate cardiology assistance On aspirin for anticoagulation for now, previously on pradaxa due to retroperitoneal hematoma and pending PEG placement Cardiology recommending tapering amiodarone down and increasing beta blocker as blood pressure tolerates  Prolonged QTC Keep magnesium > 2 and avoid QT prolonging agents  Anemia of critical illness, hemoglobin approximately stable in mid 7g/dl range -  Incidentally seen left retroperitoneal hematoma on CT to evaluate for PEG placement -  No recent iron studies, b12, or folate.   -  TSH 1.314 -  No obvious hemorrhage but no occult stool completed  Thrombocytopenia due to  acute illness, resolved.  Neck cellulitis, completed course of bactrim on 9/17  Goals of care, Patient remains full code after palliative care discussions  Severe protein energy malnutrition secondary to critical illness -  Appreciate nutrition assistance  Discharge Instructions     Medication List    STOP taking these medications   dabigatran 150 MG Caps capsule Commonly known as:  PRADAXA   magnesium oxide 400 (241.3 Mg) MG tablet Commonly known as:  MAG-OX   Melatonin 5 MG Caps   Vitamin D-3 5000 units Tabs     TAKE these medications   acetaminophen 160 MG/5ML solution Commonly known as:  TYLENOL Place 20.3 mLs (650 mg total) into feeding tube every 6 (six) hours as needed for fever.   amiodarone 200 MG tablet Commonly known as:  PACERONE Place 1 tablet (200 mg total) into feeding tube daily.   ampicillin-sulbactam 1.5 g in sodium chloride 0.9 % 50 mL Inject 1.5 g into the vein every 6 (six) hours.   artificial tears Oint ophthalmic ointment Commonly known as:  LACRILUBE Place into both eyes every 3 (three) hours as needed for dry eyes.   chlorhexidine gluconate (MEDLINE KIT) 0.12 % solution Commonly known as:  PERIDEX 15 mLs by Mouth Rinse route 2 (two) times daily.   feeding supplement (VITAL AF 1.2 CAL) Liqd Place 1,000 mLs into feeding tube continuous.   folic acid 1 MG tablet Commonly known as:  FOLVITE Place 1 tablet (1 mg total) into feeding tube daily. What changed:  how to take this   free water Soln Place 600 mLs into feeding tube every 8 (eight) hours.   furosemide 40 MG tablet Commonly known as:  LASIX Place 1 tablet (40 mg total) into feeding tube daily.   lacosamide 50 MG Tabs tablet Commonly known as:  VIMPAT Place 1 tablet (50 mg total) into feeding tube 2 (two) times daily.   lactulose 10 GM/15ML solution Commonly known as:  CHRONULAC Place 15 mLs (10 g total) into feeding tube daily. What changed:  how to take this  when  to take this   levETIRAcetam 1000 MG tablet Commonly known as:  KEPPRA 1040m per tube BID What changed:  how much to take  how to take this  when to take this  additional instructions   levothyroxine 50 MCG tablet Commonly known as:  SYNTHROID, LEVOTHROID Place 1 tablet (50 mcg total) into feeding tube daily before breakfast. What changed:  how to take this   metoprolol tartrate 50 MG tablet Commonly known as:  LOPRESSOR 1 tablet (50 mg total) by Per NG tube route 2 (two) times daily. What changed:  medication strength  how much to take  how to take this   pantoprazole sodium 40 mg/20 mL Pack Commonly known as:  PROTONIX Place 20 mLs (40 mg total) into feeding tube daily.   phenytoin 50 MG tablet Commonly known as:  DILANTIN Place 2.5 tablets (125 mg total) into feeding tube 2 (two) times daily.   pregabalin 75 MG capsule Commonly known as:  LYRICA Place 1 capsule (75 mg total) into feeding tube 2 (two) times daily.   scopolamine 1 MG/3DAYS Commonly known as:  TRANSDERM-SCOP Place 1 patch (1.5 mg total) onto the skin every  3 (three) days.   thiamine 100 MG tablet Place 1 tablet (100 mg total) into feeding tube daily. What changed:  how to take this      Follow-up Information    Thressa Sheller, MD Follow up.   Specialty:  Internal Medicine Contact information: [address], [address] [place] [address] [phone]          Allergies  Allergen Reactions  . Nsaids Other (See Comments)    Acute gastric ulcers  . Oxycodone Other (See Comments)    Per the risk calculating tool RIOSORD: (Risk Index for Overdose or Serious Opioid -induced Respiratory Depression Risk ) calculated risk in ensuing 6 months  = 83% ( see Problem List for discussion)    Consultations:  [facility] CM  Neurology  Cardiology  Palliative care  Interventional radiology   Procedures/Studies: Ct Abdomen Wo Contrast  Result Date: 06/22/2017 CLINICAL DATA:   Dysphagia, malnutrition, long-term care, assess for gastrostomy EXAM: CT ABDOMEN WITHOUT CONTRAST TECHNIQUE: Multidetector CT imaging of the abdomen was performed following the standard protocol without IV contrast. COMPARISON:  06/11/2016 FINDINGS: Lower chest: Limited with motion artifact. Small bilateral pleural effusions with bibasilar atelectasis/ consolidation. Mild cardiomegaly. No pericardial effusion. Hepatobiliary: Cirrhotic changes of the liver suspected. Chronic calcified cholelithiasis. Small amount of perihepatic ascites. No biliary obstruction or significant dilatation. Pancreas: Unremarkable. No pancreatic ductal dilatation or surrounding inflammatory changes. Spleen: Normal in size without focal abnormality. Adrenals/Urinary Tract: Artifact from the arm positions across the kidneys. No gross hydronephrosis. Nonobstructing left nephrolithiasis noted. No adrenal abnormality. Stomach/Bowel: NG tube within the stomach. The stomach is anteriorly positioned. Stomach position is amenable to fluoroscopic gastrostomy technique. No large hiatal hernia. Transverse colon is inferior to the stomach. Negative for bowel obstruction. Vascular/Lymphatic: Extensive atherosclerosis. No significant aneurysm. No adenopathy. Left retroperitoneal iliacus muscle hyperdense hematoma noted. This is partially imaged but roughly measures 5 cm, image 62 series 3. Other: Diffuse body anasarca evident.  No large ventral hernia. Musculoskeletal: Degenerative changes of the spine. Mild scoliotic curvature. Bones are osteopenic. No acute osseous finding . IMPRESSION: Stomach position is amenable to fluoroscopic gastrostomy technique. Stomach is anterior within the abdomen, beneath the subcostal margin, and superior to the transverse colon. Hepatic cirrhosis and perihepatic ascites.  Cholelithiasis. Bilateral pleural effusions and bibasilar atelectasis/consolidation Aortoiliac atherosclerosis 5 cm left iliacus intramuscular  hemorrhage, incompletely visualized Diffuse body anasarca Electronically Signed   By: Jerilynn Mages.  Shick M.D.   On: 06/22/2017 21:02   Ct Head Wo Contrast  Result Date: 05/28/2017 CLINICAL DATA:  Extremity weakness. EXAM: CT HEAD WITHOUT CONTRAST TECHNIQUE: Contiguous axial images were obtained from the base of the skull through the vertex without intravenous contrast. COMPARISON:  CT scan of May 12, 2017. FINDINGS: Brain: Encephalomalacia of right MCA territory is noted consistent with old infarction. Old left posterior parietal infarction is noted as well. No mass effect or midline shift is noted. Ventricular size is within normal limits. There is no evidence of mass lesion, hemorrhage or acute infarction. Vascular: No hyperdense vessel or unexpected calcification. Skull: Normal. Negative for fracture or focal lesion. Sinuses/Orbits: No acute finding. Other: None. IMPRESSION: Old large right MCA infarction as described above. No acute intracranial abnormality seen. Electronically Signed   By: Marijo Conception, M.D.   On: 05/28/2017 20:38   Mr Brain Wo Contrast  Result Date: 06/14/2017 CLINICAL DATA:  Initial evaluation for acute altered mental status, history of seizure. EXAM: MRI HEAD WITHOUT CONTRAST TECHNIQUE: Multiplanar, multiecho pulse sequences of the brain  and surrounding structures were obtained without intravenous contrast. COMPARISON:  Prior MRI from 05/29/2017. FINDINGS: Brain: Moderately advanced cerebral atrophy. Underline confluent and patchy/ FLAIR abnormality within the periventricular and deep white matter, most consistent with chronic microvascular ischemic disease. Multifocal areas of encephalomalacia and gliosis involving the anterior right frontal lobe, left parietal lobe, and throughout the right temporoparietal region consistent with remote ischemic infarcts. Extensive laminar necrosis throughout the remote right temporoparietal infarct. Superimposed remote lacunar infarcts within the right  basal ganglia. There is subtly increased gyriform diffusion abnormality within the mesial right temporal lobe, with involvement of the right hippocampus (Series 4, image 15), extension into the parasagittal right frontoparietal region (series 3, image 31), as well as about the right parietal encephalomalacia (series 3, image 34). Findings consistent with changes related to underlying seizure, and are similar in distribution relative to previous MRI, although are much less evident/prominent as compared to previous exam. No new intracranial hemorrhage or infarct. No midline shift, mass effect, or mass lesion. Stable ventricular prominence without hydrocephalus. No extra-axial fluid collection. Vascular: Major intracranial vascular flow voids maintained. Skull and upper cervical spine: Craniocervical junction within normal limits. Visualized upper cervical spine unremarkable. Bone marrow signal intensity normal. No scalp soft tissue abnormality. Sinuses/Orbits: Globes and orbital soft tissues within normal limits. Scattered mucosal thickening within the ethmoidal air cells and maxillary sinuses, with superimposed fluid level in the left maxillary sinus. Fluid seen layering within the posterior nasopharynx. Nasogastric tube in place. Bilateral mastoid effusions noted. Other: None. IMPRESSION: 1. Minimal gyriform diffusion abnormality involving the right cerebral hemisphere, greatest at the mesial right temporal lobe, most consistent with changes related to seizure. Changes are stable in distribution as compared to those seen on recent MRI, although are much less prominent as compared to previous exam. Findings may reflect the residua of previously seen seizure and/or new but more mild seizure activity. 2. No other acute intracranial abnormality. Underlying chronic right greater than left hemispheric encephalomalacia. Electronically Signed   By: Jeannine Boga M.D.   On: 06/14/2017 21:05   Mr Brain Wo  Contrast  Result Date: 05/29/2017 CLINICAL DATA:  76 year old male with weakness for several days. Left extremity tremor. Recent seizure activity. EXAM: MRI HEAD WITHOUT CONTRAST TECHNIQUE: Multiplanar, multiecho pulse sequences of the brain and surrounding structures were obtained without intravenous contrast. COMPARISON:  Head CT without contrast 05/28/2017. Brain MRI 05/12/2017, and earlier. FINDINGS: Study is intermittently degraded by motion artifact despite repeated imaging attempts. Diagnostic axial FLAIR imaging could not be obtained. Brain: Gyriform restricted diffusion throughout much of the posterior hemisphere. Involvement of the posterosuperior right frontal lobe, right parietal lobe, and the right temporal lobe including the right hippocampal formation (series 4, image 16). This diffusion abnormality is new compared to 05/12/2017. No contralateral left hemisphere and no posterior fossa restricted diffusion today. Underlying widespread right hemisphere encephalomalacia, primarily throughout the right MCA territory. Significant right temporal lobe involvement. Posterior left MCA encephalomalacia. Associated bilateral hemisphere hemosiderin. No acute hemorrhage identified. Stable cerebral volume. Stable ventricle size and configuration. No midline shift, mass effect, or evidence of intracranial mass lesion. The thalami I, brainstem, and cerebellum appear stable and within normal limits. Vascular: Major intracranial vascular flow voids are stable. Skull and upper cervical spine: Negative. Normal bone marrow signal. Sinuses/Orbits: Stable and negative. Other: Mastoid air cells remain clear. IMPRESSION: 1. Widespread gyriform restricted diffusion in the right hemisphere including the right hippocampus. This is most compatible with sequelae of seizure activity. 2. No other acute intracranial abnormality.  Underlying chronic right greater than left hemispheric encephalomalacia. Electronically Signed   By: Genevie Ann M.D.   On: 05/29/2017 01:41   Dg Chest Port 1 View  Result Date: 06/22/2017 CLINICAL DATA:  Pneumonia EXAM: PORTABLE CHEST 1 VIEW COMPARISON:  06/20/2017 FINDINGS: Tracheostomy in good position. Central venous catheter tip at the cavoatrial junction unchanged. NG tube in the stomach. Progression of left lower lobe atelectasis. Right lower lobe airspace disease also mildly progressed. Negative for edema. IMPRESSION: Progression of bibasilar consolidation most likely atelectasis. Electronically Signed   By: Franchot Gallo M.D.   On: 06/22/2017 07:38   Dg Chest Port 1 View  Result Date: 06/20/2017 CLINICAL DATA:  Followup pneumonia EXAM: PORTABLE CHEST 1 VIEW COMPARISON:  06/19/2017 FINDINGS: Tracheostomy remains in place. Nasogastric tube enters the abdomen. Right internal jugular central line tip at the SVC RA junction. Persistent right effusion with right lower lobe collapse. Mild atelectasis at the left base. No change since yesterday. IMPRESSION: No change. Right effusion with right lower lung collapse. Mild atelectasis left base. Electronically Signed   By: Nelson Chimes M.D.   On: 06/20/2017 08:34   Dg Chest Port 1 View  Result Date: 06/19/2017 CLINICAL DATA:  Followup respiratory failure. EXAM: PORTABLE CHEST 1 VIEW COMPARISON:  06/18/2017 FINDINGS: Tracheostomy, nasogastric tube and right internal jugular central line are unchanged. Persistent bilateral lower lobe infiltrate and volume loss, not visibly changed since yesterday. Slow trend of improvement since films before that. IMPRESSION: Persistent, slowly improving bilateral lower lobe infiltrate and volume loss. Electronically Signed   By: Nelson Chimes M.D.   On: 06/19/2017 08:24   Dg Chest Port 1 View  Result Date: 06/18/2017 CLINICAL DATA:  Pneumonia. EXAM: PORTABLE CHEST 1 VIEW COMPARISON:  June 16, 2017. FINDINGS: Unchanged positioning of the tracheostomy tube, enteric tube, and right internal jugular central venous catheter.  Stable cardiomegaly. Small bilateral pleural effusions, possibly slightly improved. Bibasilar atelectasis. No pneumothorax. No acute osseous abnormality. Several healed rib fractures are again noted. IMPRESSION: Small bilateral pleural effusions, possibly slightly improved when compared to prior study. Electronically Signed   By: Titus Dubin M.D.   On: 06/18/2017 08:57   Dg Chest Port 1 View  Result Date: 06/16/2017 CLINICAL DATA:  Pleural effusion EXAM: PORTABLE CHEST 1 VIEW COMPARISON:  06/14/2017 FINDINGS: Cardiac shadow is enlarged but stable. Tracheostomy tube, nasogastric catheter and right jugular central line are again seen and stable. Bilateral pleural effusions are again noted and stable. No new focal infiltrate or pneumothorax is seen. No acute bony abnormality is noted. IMPRESSION: Stable bilateral pleural effusions. Tubes and lines stable in appearance. Electronically Signed   By: Inez Catalina M.D.   On: 06/16/2017 07:55   Dg Chest Port 1 View  Result Date: 06/14/2017 CLINICAL DATA:  Follow-up pleural effusion. History of cirrhosis, chronic CHF, aspiration, former smoker. EXAM: PORTABLE CHEST 1 VIEW COMPARISON:  Portable chest x-ray of June 12, 2017 FINDINGS: The tracheostomy tube tip projects at the inferior margin of the clavicular heads. The lungs are reasonably well inflated. There are bibasilar densities consistent with posterior layering pleural effusions. The cardiac silhouette is enlarged. The central pulmonary vascularity is mildly prominent. The esophagogastric tube tip likely projects below the inferior margin of the image but the inferior aspect is not included in the field of view. The right internal jugular venous catheter tip projects over the distal third of the SVC. IMPRESSION: Bilateral pleural effusions layering posteriorly. Bibasilar atelectasis or pneumonia is not excluded. Low-grade CHF. The  support tubes are in reasonable position though the tip of the  esophagogastric tube cannot be definitely discerned. Electronically Signed   By: David  Martinique M.D.   On: 06/14/2017 07:22   Dg Chest Port 1 View  Result Date: 06/12/2017 CLINICAL DATA:  Pleural effusion. EXAM: PORTABLE CHEST 1 VIEW COMPARISON:  Radiograph of June 09, 2017. FINDINGS: Mild cardiomegaly is noted. Tracheostomy tube is in grossly good position. Nasogastric tube is seen entering the stomach. Right internal jugular catheter is noted with distal tip in expected position of the SVC. No pneumothorax is noted. Stable bibasilar atelectasis or edema is noted with associated pleural effusions. Bony thorax is unremarkable. IMPRESSION: Stable support apparatus. Stable bibasilar opacities as described above. Electronically Signed   By: Marijo Conception, M.D.   On: 06/12/2017 15:03   Dg Chest Port 1 View  Result Date: 06/09/2017 CLINICAL DATA:  Tracheostomy tube placed. EXAM: PORTABLE CHEST 1 VIEW COMPARISON:  Earlier today. FINDINGS: The endotracheal tube has been replaced with a tracheostomy tube in satisfactory position. Nasogastric tube side hole in the mid stomach and tip not included. Right jugular catheter tip in the upper right atrium, lower in position with a decreased inspiration. No significant change in bilateral pleural fluid and bibasilar airspace opacity. Stable borderline enlarged cardiac silhouette. Left mid thoracic spine costovertebral degenerative changes. IMPRESSION: 1. Tracheostomy tube in satisfactory position. 2. No significant change in bilateral pleural effusions and bibasilar atelectasis or pneumonia. Electronically Signed   By: Claudie Revering M.D.   On: 06/09/2017 11:20   Dg Chest Port 1 View  Result Date: 06/09/2017 CLINICAL DATA:  Respiratory failure.  Intubated. EXAM: PORTABLE CHEST 1 VIEW COMPARISON:  06/07/2017. FINDINGS: Endotracheal tube in satisfactory position. Right jugular catheter tip in the inferior aspect of the superior vena cava. Nasogastric tube extending into  the stomach. Stable borderline enlarged cardiac silhouette. Increased ill-defined density at both lung bases, including bilateral pleural effusions, larger on the right. Diffuse osteopenia. IMPRESSION: Increased bilateral pleural fluid and adjacent bibasilar atelectasis or pneumonia. Electronically Signed   By: Claudie Revering M.D.   On: 06/09/2017 07:46   Dg Chest Port 1 View  Result Date: 06/07/2017 CLINICAL DATA:  Acute respiratory failure. EXAM: PORTABLE CHEST 1 VIEW COMPARISON:  One-view chest x-ray 06/06/2017 FINDINGS: Endotracheal tube, NG tube, and right IJ line are stable. Heart size is exaggerated by low lung volumes. Interstitial edema and bilateral effusions are stable. Bibasilar airspace disease likely reflects atelectasis. IMPRESSION: 1. Stable low lung volumes and mild pulmonary vascular congestion. 2. Stable bilateral pleural effusions and bibasilar airspace disease, likely atelectasis. 3. Support apparatus is stable. Electronically Signed   By: San Morelle M.D.   On: 06/07/2017 07:23   Dg Chest Port 1 View  Result Date: 06/06/2017 CLINICAL DATA:  Acute respiratory failure. EXAM: PORTABLE CHEST 1 VIEW COMPARISON:  06/05/2017 FINDINGS: Endotracheal tube, NG tube, and right IJ line are stable. Heart is enlarged. There is no significant change an mild pulmonary vascular congestion and bibasilar atelectasis. Small effusions are likely present. IMPRESSION: 1. Stable low lung volumes and mild pulmonary vascular congestion. 2. Bibasilar airspace disease is unchanged. 3. Support apparatus is stable. Electronically Signed   By: San Morelle M.D.   On: 06/06/2017 07:06   Dg Chest Port 1 View  Result Date: 06/05/2017 CLINICAL DATA:  Respiratory failure. EXAM: PORTABLE CHEST 1 VIEW COMPARISON:  Respiratory failure. FINDINGS: The heart size is exaggerated. Endotracheal tube, right IJ line, and NG tube are stable. Bibasilar atelectasis is  noted. Pulmonary vascular congestion is unchanged.  IMPRESSION: 1. No significant interval change and low lung volumes and bibasilar atelectasis. 2. Mild pulmonary vascular congestion. 3. Support apparatus is stable. Electronically Signed   By: San Morelle M.D.   On: 06/05/2017 08:25   Dg Chest Port 1 View  Result Date: 06/04/2017 CLINICAL DATA:  Respiratory failure. EXAM: PORTABLE CHEST 1 VIEW COMPARISON:  06/03/2017 . FINDINGS: Endotracheal tube, NG tube, right IJ line in stable position. Stable cardiomegaly. Mild left base infiltrate noted on today's exam. Small left pleural effusion . No pneumothorax. IMPRESSION: 1. Lines and tubes in stable position. 2. Mild left base infiltrate noted on today's exam. Pneumonia cannot be excluded. Small left pleural effusion. 3.  Stable cardiomegaly. Electronically Signed   By: Marcello Moores  Register   On: 06/04/2017 07:21   Dg Chest Port 1 View  Result Date: 06/03/2017 CLINICAL DATA:  Stroke, seizures EXAM: PORTABLE CHEST 1 VIEW COMPARISON:  06/02/2017 FINDINGS: Support devices are stable. Mild cardiomegaly and bibasilar atelectasis. Mild vascular congestion. No visible effusions or pneumothorax. IMPRESSION: Mild cardiomegaly and vascular congestion.  Bibasilar atelectasis. Electronically Signed   By: Rolm Baptise M.D.   On: 06/03/2017 07:17   Dg Chest Port 1 View  Result Date: 06/02/2017 CLINICAL DATA:  Respiratory failure EXAM: PORTABLE CHEST 1 VIEW COMPARISON:  06/01/2017 FINDINGS: Endotracheal tube tip just below the clavicular heads. An orogastric tube and side-port reaches the stomach. Right IJ central line with tip at the upper cavoatrial junction. Mild haziness of the left more than right lower chest, likely atelectasis and possibly trace fluid. No pneumothorax or air bronchogram. No pulmonary edema. Stable borderline heart size. IMPRESSION: 1. Stable positioning of tubes and central line. 2. Unchanged mild atelectasis atelectasis at the bases. Possible trace pleural fluid on the left. Electronically  Signed   By: Monte Fantasia M.D.   On: 06/02/2017 08:41   Dg Chest Port 1 View  Result Date: 06/01/2017 CLINICAL DATA:  Right central line placement EXAM: PORTABLE CHEST 1 VIEW COMPARISON:  Portable chest x-ray of 06/01/2017 FINDINGS: A right central venous line is now present with the tip overlying the lower SVC. No pneumothorax is seen. Mild basilar atelectasis is noted left-greater-than-right. Mediastinal and hilar contours are unremarkable. The tip of the endotracheal tube is approximately 4.9 cm above the carina. NG tube extends below the hemidiaphragm. Heart size is stable. A small left pleural effusion cannot be excluded. IMPRESSION: 1. Right central venous line tip overlies the lower SVC. No pneumothorax. 2. Mild basilar volume loss.  Question small left pleural effusion. 3. Tip of endotracheal tube 4.9 cm above the carina. Electronically Signed   By: Ivar Drape M.D.   On: 06/01/2017 10:41   Dg Chest Port 1 View  Result Date: 06/01/2017 CLINICAL DATA:  Check endotracheal tube placement EXAM: PORTABLE CHEST 1 VIEW COMPARISON:  05/31/2017 FINDINGS: Cardiac shadow is at the upper limits of normal in size. Endotracheal tube and nasogastric catheter are noted in satisfactory position. The lungs are well aerated bilaterally. Slight increased density is noted in the left base consistent with atelectasis. No other focal abnormality is noted. IMPRESSION: Minimal left basilar atelectasis. Electronically Signed   By: Inez Catalina M.D.   On: 06/01/2017 08:58   Dg Chest Port 1 View  Result Date: 05/31/2017 CLINICAL DATA:  Aspiration. EXAM: PORTABLE CHEST 1 VIEW COMPARISON:  05/28/2017 FINDINGS: There is no focal parenchymal opacity. There is no pleural effusion or pneumothorax. The heart and mediastinal contours are unremarkable. The  osseous structures are unremarkable. IMPRESSION: No active disease. Electronically Signed   By: Kathreen Devoid   On: 05/31/2017 14:33   Dg Chest Portable 1 View  Result Date:  05/28/2017 CLINICAL DATA:  Weakness and new onset seizures. EXAM: PORTABLE CHEST 1 VIEW COMPARISON:  11/12/2016 FINDINGS: 1946 hours. The cardio pericardial silhouette is enlarged. There is pulmonary vascular congestion without overt pulmonary edema. Probable minimal basilar atelectasis. No edema, focal airspace consolidation, or substantial pleural effusion. Bones are diffusely demineralized. Telemetry leads overlie the chest. IMPRESSION: Cardiomegaly with vascular congestion. Electronically Signed   By: Misty Stanley M.D.   On: 05/28/2017 20:06   Dg Abd Portable 1v  Result Date: 06/23/2017 CLINICAL DATA:  NG tube placement. EXAM: PORTABLE ABDOMEN - 1 VIEW COMPARISON:  None. FINDINGS: Nasogastric tube tip appears to lie along the greater curvature of the stomach. Nonobstructive gas pattern. IMPRESSION: NG tube tip in stomach. Electronically Signed   By: Staci Righter M.D.   On: 06/23/2017 09:07   Dg Abd Portable 1v  Result Date: 06/11/2017 CLINICAL DATA:  NG tube placement EXAM: PORTABLE ABDOMEN - 1 VIEW COMPARISON:  06/07/2017 FINDINGS: Bilateral effusions and bibasilar consolidation. Enlarged heart size. Right-sided central venous catheter tip overlies the cavoatrial junction. Esophageal tube tip overlies the mid stomach, side-port projects slightly beyond the GE junction. IMPRESSION: 1. Esophageal tube tip projects over the mid stomach 2. Bibasilar consolidations and probable effusions Electronically Signed   By: Donavan Foil M.D.   On: 06/11/2017 21:38   Dg Abd Portable 1v  Result Date: 06/07/2017 CLINICAL DATA:  Abdominal ileus. EXAM: PORTABLE ABDOMEN - 1 VIEW COMPARISON:  06/03/2017 FINDINGS: A nasogastric tube projects over the stomach body. Reduced amount of colonic gas compare to prior, although still moderate in the left upper quadrant/splenic flexure region. Suspicion for airspace opacity at the left lung base. Mild dextroconvex lumbar scoliosis with spondylosis. The lower abdomen is mostly  gasless. IMPRESSION: 1. Reduced gaseous prominence of the large bowel. Mostly gasless small bowel. 2. Suspected airspace opacity at the left lung base. 3. Nasogastric tube tip:  Stomach body. Electronically Signed   By: Van Clines M.D.   On: 06/07/2017 08:43   Dg Abd Portable 1v  Result Date: 06/03/2017 CLINICAL DATA:  Hepatic cirrhosis, gastric ulcer, gallstones, GI bleed. EXAM: PORTABLE ABDOMEN - 1 VIEW COMPARISON:  Abdominal radiograph of June 01, 2017 FINDINGS: There is a moderate amount of gas within bowel which appears reflect large bowel. There is mild gaseous distention of a loop of small bowel in the mid abdomen. There is no free extraluminal gas. The esophagogastric tube is no longer teens and the proximal port and tip lie in the gastric body. IMPRESSION: Mild small and large bowel distention which likely reflects an ileus. There is no evidence of perforation. Electronically Signed   By: David  Martinique M.D.   On: 06/03/2017 16:40   Dg Abd Portable 1v  Result Date: 06/01/2017 CLINICAL DATA:  NG tube placement EXAM: PORTABLE ABDOMEN - 1 VIEW COMPARISON:  None. FINDINGS: The NG tube appears kinked in the stomach. Recommend repositioning and repeat radiograph. IMPRESSION: Kinked appearance of the NG tube in the stomach. Repositioning and repeat radiograph may be helpful. Electronically Signed   By: Ulyses Jarred M.D.   On: 06/01/2017 05:46    Subjective: Nonverbal, but followed commands today  Discharge Exam: Vitals:   06/24/17 0700 06/24/17 0742  BP:    Pulse:  82  Resp:  (!) 22  Temp: 97.6 F (36.4  C)   SpO2:  100%   Vitals:   06/24/17 0400 06/24/17 0451 06/24/17 0700 06/24/17 0742  BP: 94/63     Pulse: 70   82  Resp: 16   (!) 22  Temp:   97.6 F (36.4 C)   TempSrc:   Oral   SpO2: 100%   100%  Weight:  94.5 kg (208 lb 5.4 oz)    Height:        General: Pt is awake, making eye contact and following commands.  HEENT:  No surrounding erythema near tracheostomy or  cuff.  Right IJ in place also without surrounding erythema Cardiovascular: IRRR, S1/S2 +, no rubs, no gallops Respiratory:  rhonchorous breath sounds, no focal rales or wheezes Abdominal:  Soft, NT, ND, bowel sounds +.  NG in place Extremities:  2+ full body edema/arms and legs consistent with anasarca despite bilateral lower extremity TED hose Neuro:  Able to grip my hand with the right hand, but not the left or wiggle the toes.  Nods to indicate he can feel me touching his feet.  Blinks on command Foley catheter and rectal tubes in place    The results of significant diagnostics from this hospitalization (including imaging, microbiology, ancillary and laboratory) are listed below for reference.     Microbiology: No results found for this or any previous visit (from the past 240 hour(s)).   Labs: BNP (last 3 results)  Recent Labs  08/20/16 2041  BNP 283.1*   Basic Metabolic Panel:  Recent Labs Lab 06/17/17 1201  06/18/17 0900 06/19/17 0520 06/20/17 0405 06/21/17 0444 06/21/17 1910 06/22/17 0504  NA  --   < >  --  148* 150* 148* 143 144  K  --   < >  --  4.4 4.5 3.9 3.8 3.8  CL  --   < >  --  125* 126* 121* 118* 118*  CO2  --   < >  --  20* 21* _0 GLUCOSE  --   < >  --  118* 117* 116* 103* 91  BUN  --   < >  --  31* 30* 26* 26* 26*  CREATININE  --   < >  --  0.78 0.71 0.69 0.69 0.75  CALCIUM  --   < >  --  8.7* 8.6* 8.3* 8.1* 8.1*  MG 1.8  --  2.0  --   --  1.8  --   --   PHOS  --   --   --   --   --   --  3.4  --   < > = values in this interval not displayed. Liver Function Tests:  Recent Labs Lab 06/18/17 0346 06/19/17 0520 06/21/17 0444 06/21/17 1910  AST  --  73* 58*  --   ALT  --  30 27  --   ALKPHOS  --  65 69  --   BILITOT  --  0.7 1.1  --   PROT  --  5.6* 5.4*  --   ALBUMIN 2.0* 2.1* 1.9* 1.8*   No results for input(s): LIPASE, AMYLASE in the last 168 hours. No results for input(s): AMMONIA in the last 168 hours. CBC:  Recent Labs Lab  06/17/17 1030 06/19/17 0520 06/21/17 0444  WBC 6.2 6.3 5.6  NEUTROABS 4.8 4.5 4.1  HGB 7.4* 7.7* 7.6*  HCT 22.7* 24.1* 24.6*  MCV 91.9 94.9 97.6  PLT 212 258 244   Cardiac Enzymes:  No results for input(s): CKTOTAL, CKMB, CKMBINDEX, TROPONINI in the last 168 hours. BNP: Invalid input(s): POCBNP CBG:  Recent Labs Lab 06/23/17 1637 06/23/17 1952 06/23/17 2354 06/24/17 0353 06/24/17 0808  GLUCAP 96 89 94 84 100*   D-Dimer No results for input(s): DDIMER in the last 72 hours. Hgb A1c No results for input(s): HGBA1C in the last 72 hours. Lipid Profile No results for input(s): CHOL, HDL, LDLCALC, TRIG, CHOLHDL, LDLDIRECT in the last 72 hours. Thyroid function studies No results for input(s): TSH, T4TOTAL, T3FREE, THYROIDAB in the last 72 hours.  Invalid input(s): FREET3 Anemia work up No results for input(s): VITAMINB12, FOLATE, FERRITIN, TIBC, IRON, RETICCTPCT in the last 72 hours. Urinalysis    Component Value Date/Time   COLORURINE YELLOW 05/29/2017 0300   APPEARANCEUR CLEAR 05/29/2017 0300   LABSPEC 1.010 05/29/2017 0300   PHURINE 8.0 05/29/2017 0300   GLUCOSEU NEGATIVE 05/29/2017 0300   HGBUR NEGATIVE 05/29/2017 0300   BILIRUBINUR NEGATIVE 05/29/2017 0300   KETONESUR 5 (A) 05/29/2017 0300   PROTEINUR NEGATIVE 05/29/2017 0300   UROBILINOGEN 2.0 (H) 12/13/2014 1256   NITRITE NEGATIVE 05/29/2017 0300   LEUKOCYTESUR NEGATIVE 05/29/2017 0300   Sepsis Labs Invalid input(s): PROCALCITONIN,  WBC,  LACTICIDVEN   Time coordinating discharge: Over 30 minutes  SIGNED:   Janece Canterbury, MD  [facility] 06/24/2017, 10:10 AM Pager   If 7PM-7AM, please contact night-coverage [email or web address] Password [email or web address]

## 2017-06-24 NOTE — Care Management Important Message (Signed)
Important Message  Patient Details  Name: Nathan Frank MRN: [id number] Date of Birth: 10-Aug-1941   Medicare Important Message Given:  Yes    [name] May 06/24/2017, 9:47 AM

## 2017-06-24 NOTE — Progress Notes (Signed)
[facility] here to transport patient to [facility].  Current settings given to transport team.

## 2017-06-24 NOTE — Care Management Important Message (Signed)
Important Message  Patient Details  Name: Nathan Frank MRN: [id number] Date of Birth: 12-25-1940   Medicare Important Message Given:  Yes    [name] May 06/24/2017, 9:56 AM

## 2017-06-24 NOTE — Care Management Note (Signed)
Case Management Note  Patient Details  Name: Nathan Frank MRN: [id number] Date of Birth: January 25, 1941  Subjective/Objective:  From [facility], presents with status epilepticus, hepatic encephalopathy and compromised airway. Hx of CVA, seizures, cirrhosis, non ischemic CM. Has trach/vent, NG tube feeds.   9/12 [place], BSN- per CSW , MD wanted to look at Ltach, per MD notes patient has no meaningful recovery, not appropriate for ltach. NCM informed MD . Per PCCM he may not come off vent. Family conts to want aggressive care. CSW following for SNF.   9/18 [place], BSN -discussed in [place], per medical director appropriate for ltach, NCM spoke with son, Quintan [phone], he chose [facility]. Referral given to [place] with [facility] for ltach.  9/19 [place], BSN - patient has been approved to go to [facility] tomorrow,  MD is aware and Nathan Frank is aware, NCM left message for patient son, Ulysee Fyock that patient has been approved and will be dc to [facility] tomorrow.  NCM called back again and son answered , NCM informed him of the above information.  9/20 [place], BSN -Patient for dc to [facility] at [facility] today.                                  Action/Plan:  Expected Discharge Date:  06/24/17               Expected Discharge Plan:  Long Term Acute Care (LTAC)  In-House Referral:  Clinical Social Work  Discharge planning Services  CM Consult  Post Acute Care Choice:    Choice offered to:     DME Arranged:    DME Agency:     HH Arranged:    [place] Agency:     Status of Service:  Completed, signed off  If discussed at [employer], dates discussed:    Additional Comments:  Nathan Mayo, Nathan Frank 06/24/2017, 3:43 PM

## 2017-06-24 NOTE — Progress Notes (Signed)
[facility] called to set up transport for patient to go to [facility]. [facility] to transport as soon as team available. Will continue to monitor patient.

## 2017-06-24 NOTE — Progress Notes (Signed)
[place] NOTE  Pharmacy Consult for Phenytoin Indication: Seizures  Allergies  Allergen Reactions  . Nsaids Other (See Comments)    Acute gastric ulcers  . Oxycodone Other (See Comments)    Per the risk calculating tool RIOSORD: (Risk Index for Overdose or Serious Opioid -induced Respiratory Depression Risk ) calculated risk in ensuing 6 months  = 83% ( see Problem List for discussion)    Patient Measurements: Height: 5\' 11"  (180.3 cm) Weight: 208 lb 5.4 oz (94.5 kg) IBW/kg (Calculated) : 75.3   Vital Signs: Temp: 97.6 F (36.4 C) (09/20 0700) Temp Source: Oral (09/20 0700) BP: 94/63 (09/20 0400) Pulse Rate: 82 (09/20 0742) Intake/Output from previous day: 09/19 0701 - 09/20 0700 In: 2718.3 [NG/GT:2518.3; IV Piggyback:200] Out: 1985 [Urine:1985] Intake/Output from this shift: No intake/output data recorded.  Labs:  Recent Labs  06/21/17 1910 06/22/17 0504  CREATININE 0.69 0.75  PHOS 3.4  --   ALBUMIN 1.8*  --    Estimated Creatinine Clearance: 92.2 mL/min (by C-G formula based on SCr of 0.75 mg/dL).  Medical History: Past Medical History:  Diagnosis Date  . Abnormal TSH   . Acute gastric ulcer   . Acute gastritis with hemorrhage   . Alcohol abuse   . Alcohol dependence ([place]) 02/02/2014  . Anemia   . Bifascicular block   . Cirrhosis ([place]) 06/04/2012  . Depressive disorder 02/01/2014  . Dizziness and giddiness 12/13/2014  . DVT of lower limb, acute ([place]) 06/06/2012  . DVT, lower extremity, recurrent ([place])    a. noted 2013. b. also dx 06/2016.  Marland Kitchen Essential hypertension   . Fatty liver   . Gallstones   . Gastritis 06/07/2012  . GERD (gastroesophageal reflux disease)   . GI bleed due to NSAIDs 12/13/2014  . Granulomatous gastritis   . Hematuria 06/04/2012  . Hepatitis C   . Hepatocellular carcinoma ([place])   . Heroin abuse    "I haven't done that since I don't know when."  . Heroin overdose 02/20/2014  . Neuropathy   . NICM (nonischemic  cardiomyopathy) ([place])    a. 04/2015: EF 45-50% by cath. b. EF 40-45% by echo 06/2016.  . NSTEMI (non-ST elevated myocardial infarction) ([place])    a. 04/2015 - patent coronaries. Etiology possibly due to coronary spasm versus embolus, stress cardiomyopathy (atypical), and aborted infarction related to plaque rupture with thrombosis and dissolution. Amlodipine started. Not on antiplatelets due to GIB/cirrhosis history.  . Oral thrush 06/05/2012  . Polysubstance abuse    THC, alcohol, heroin  . Prolonged Q-T interval on ECG    a. 12/2014 - treated with magnesium.  . Right knee pain 12/13/2014  . S/P alcohol detoxification 02/02/2014  . Stroke ([place]) 06/2016  . SVT (supraventricular tachycardia) ([place])    a. 12/2014 in setting of GIB, ETOH, NSAIDS, gastritis.  . Symptomatic cholelithiasis 12/15/2013  . Thrombocytopenia ([place])   . Upper GI bleeding 12/13/2014  . Weight loss 06/04/2012   9/5 phenytoin lvl 13.2; corrected to 26.4 (not a tr) 9/7 phenytoin lvl 13.5, corrects to 27 (tr - dose reduced to 100 q12) 9/10 phenytoin lvl 7.7, corrected to 18 (resume 100 q12) 9/14 phenytoin lvl 4.7, corrected to 9 (cont. 100 q12) 9/17: phenytoin free 0.6 (goal 1-2), total 3.3.    Assessment: 75yom started on phenytoin for seizures on 8/28. Regimen was adjusted 9/7 due to supratherapeutic level.  His last total phenytoin level on 9/14 was slightly subtherapeutic when corrected for low albumin. No new seizures noted  and AED regimen consist of Dilantin, Keppra and Vimpat.   A phenytoin total and free level were ordered by Dr. Verlon Au on 9/17.  His total level is low (goal 1-2, no albumin correction needed for free levels) and when calculating his phenytoin free fraction he has a higher percentage of unbound (free, active) phenytoin than population kinetics.  His free fraction is 18%, population kinetics suggest a usual free fraction is 10%.  Based on this information we can calculate a patient-specific total phenytoin goal  that does not require any correction for hypoalbuminemia.  Goal of Therapy:  Free phenytoin level 1-2 mcg/ml Patient-specific total phenytoin level 5.6-11, no correction for albumin required  Plan:  1) Increase phenytoin to 125mg  per tube q12h 2) Will check another total phenytoin level in next 5-7 days unless clinical course dictates otherwise   Norva Riffle 06/24/2017, 8:51 AM

## 2017-06-28 DIAGNOSIS — Z9911 Dependence on respirator [ventilator] status: Secondary | ICD-10-CM | POA: Diagnosis not present

## 2017-06-28 DIAGNOSIS — J9620 Acute and chronic respiratory failure, unspecified whether with hypoxia or hypercapnia: Secondary | ICD-10-CM | POA: Diagnosis not present

## 2017-06-28 DIAGNOSIS — I4891 Unspecified atrial fibrillation: Secondary | ICD-10-CM | POA: Diagnosis not present

## 2017-06-30 DIAGNOSIS — Z9911 Dependence on respirator [ventilator] status: Secondary | ICD-10-CM | POA: Diagnosis not present

## 2017-06-30 DIAGNOSIS — J9620 Acute and chronic respiratory failure, unspecified whether with hypoxia or hypercapnia: Secondary | ICD-10-CM | POA: Diagnosis not present

## 2017-06-30 DIAGNOSIS — I4891 Unspecified atrial fibrillation: Secondary | ICD-10-CM | POA: Diagnosis not present

## 2017-07-21 DIAGNOSIS — I469 Cardiac arrest, cause unspecified: Secondary | ICD-10-CM | POA: Diagnosis not present

## 2017-07-21 DIAGNOSIS — J9620 Acute and chronic respiratory failure, unspecified whether with hypoxia or hypercapnia: Secondary | ICD-10-CM | POA: Diagnosis not present

## 2017-07-21 DIAGNOSIS — K7290 Hepatic failure, unspecified without coma: Secondary | ICD-10-CM | POA: Diagnosis not present

## 2017-07-21 DIAGNOSIS — K2900 Acute gastritis without bleeding: Secondary | ICD-10-CM | POA: Diagnosis not present

## 2017-07-21 DIAGNOSIS — I4891 Unspecified atrial fibrillation: Secondary | ICD-10-CM | POA: Diagnosis not present

## 2017-07-21 DIAGNOSIS — Z930 Tracheostomy status: Secondary | ICD-10-CM | POA: Diagnosis not present

## 2017-07-21 DIAGNOSIS — I69398 Other sequelae of cerebral infarction: Secondary | ICD-10-CM | POA: Diagnosis not present

## 2017-07-21 DIAGNOSIS — D649 Anemia, unspecified: Secondary | ICD-10-CM | POA: Diagnosis not present

## 2017-07-21 DIAGNOSIS — E039 Hypothyroidism, unspecified: Secondary | ICD-10-CM | POA: Diagnosis not present

## 2017-07-21 DIAGNOSIS — M6281 Muscle weakness (generalized): Secondary | ICD-10-CM | POA: Diagnosis not present

## 2017-07-21 DIAGNOSIS — Z431 Encounter for attention to gastrostomy: Secondary | ICD-10-CM | POA: Diagnosis not present

## 2017-07-21 DIAGNOSIS — J690 Pneumonitis due to inhalation of food and vomit: Secondary | ICD-10-CM | POA: Diagnosis not present

## 2017-07-21 DIAGNOSIS — Z87898 Personal history of other specified conditions: Secondary | ICD-10-CM | POA: Diagnosis not present

## 2017-07-21 DIAGNOSIS — I6990 Unspecified sequelae of unspecified cerebrovascular disease: Secondary | ICD-10-CM | POA: Diagnosis not present

## 2017-07-21 DIAGNOSIS — J9621 Acute and chronic respiratory failure with hypoxia: Secondary | ICD-10-CM | POA: Diagnosis not present

## 2017-07-21 DIAGNOSIS — E43 Unspecified severe protein-calorie malnutrition: Secondary | ICD-10-CM | POA: Diagnosis not present

## 2017-07-21 DIAGNOSIS — R1312 Dysphagia, oropharyngeal phase: Secondary | ICD-10-CM | POA: Diagnosis not present

## 2017-07-21 DIAGNOSIS — J189 Pneumonia, unspecified organism: Secondary | ICD-10-CM | POA: Diagnosis not present

## 2017-07-21 DIAGNOSIS — I471 Supraventricular tachycardia: Secondary | ICD-10-CM | POA: Diagnosis not present

## 2017-07-21 DIAGNOSIS — R2689 Other abnormalities of gait and mobility: Secondary | ICD-10-CM | POA: Diagnosis not present

## 2017-07-21 DIAGNOSIS — J9600 Acute respiratory failure, unspecified whether with hypoxia or hypercapnia: Secondary | ICD-10-CM | POA: Diagnosis not present

## 2017-07-21 DIAGNOSIS — I10 Essential (primary) hypertension: Secondary | ICD-10-CM | POA: Diagnosis not present

## 2017-07-21 DIAGNOSIS — I639 Cerebral infarction, unspecified: Secondary | ICD-10-CM | POA: Diagnosis not present

## 2017-07-21 DIAGNOSIS — G9341 Metabolic encephalopathy: Secondary | ICD-10-CM | POA: Diagnosis not present

## 2017-07-21 DIAGNOSIS — K5900 Constipation, unspecified: Secondary | ICD-10-CM | POA: Diagnosis not present

## 2017-08-05 DIAGNOSIS — 419620001 Death: Secondary | SNOMED CT | POA: Diagnosis not present

## 2017-08-05 DEATH — deceased

## 2018-08-29 IMAGING — CT CT HEAD W/O CM
3 of 7 series · 13 of 47 positions shown, 15 images · non-contrast
Comparison: Head CT without contrast 08/20/2016. Head and cervical
spine CT 08/12/2016.

CLINICAL DATA: 75-year-old male status post fall on blood thinners.
Initial encounter.

EXAM:
CT HEAD WITHOUT CONTRAST
CT CERVICAL SPINE WITHOUT CONTRAST
TECHNIQUE: Multidetector CT imaging of the head and cervical spine was
performed following the standard protocol without intravenous
contrast. Multiplanar CT image reconstructions of the cervical spine
were also generated.

[Series 302: soft tissue, idose (2) · axial · 0.39mm/px · z∈[+990,+1204]mm · 8 of 123 slices shown, 10 images]
[im 8/123  brain]
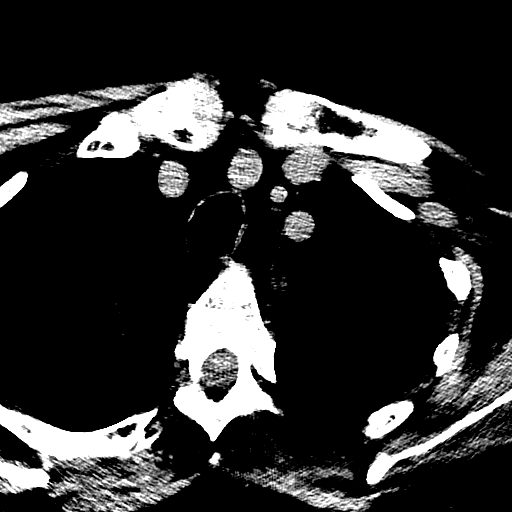
[im 8/123  bone]
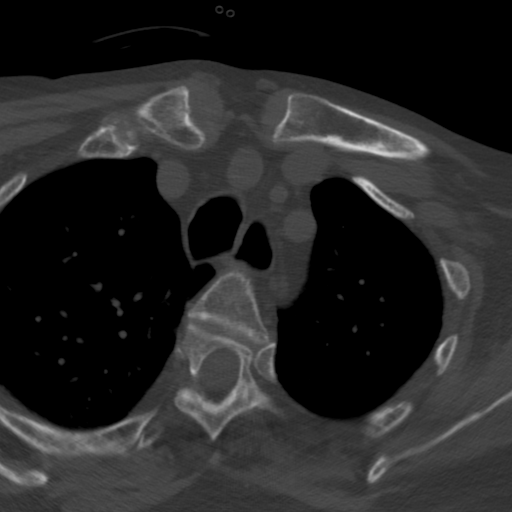
[im 23/123  brain]
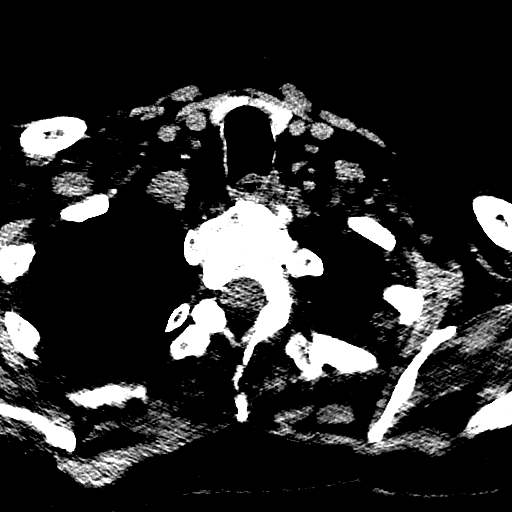
[im 39/123  brain]
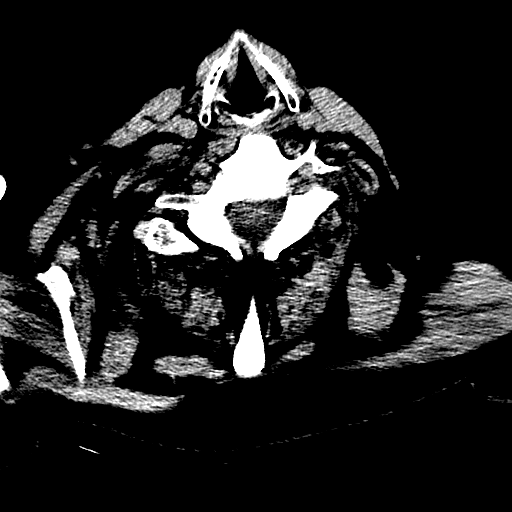
[im 54/123  brain]
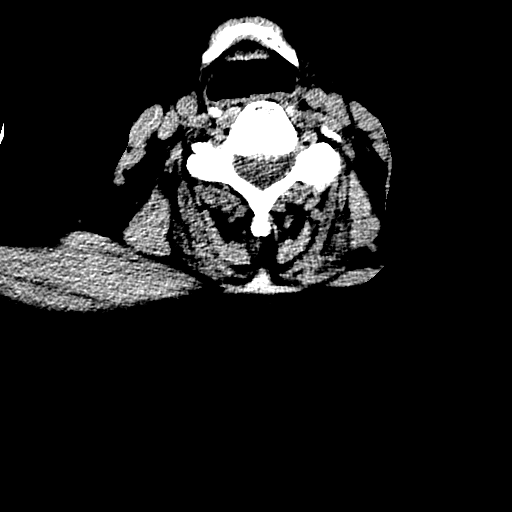
[im 69/123  brain]
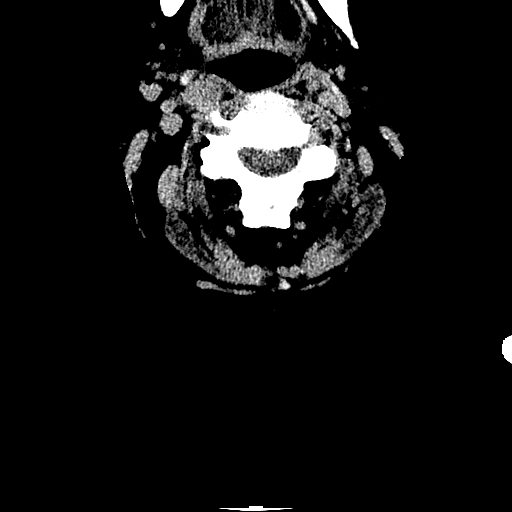
[im 69/123  bone]
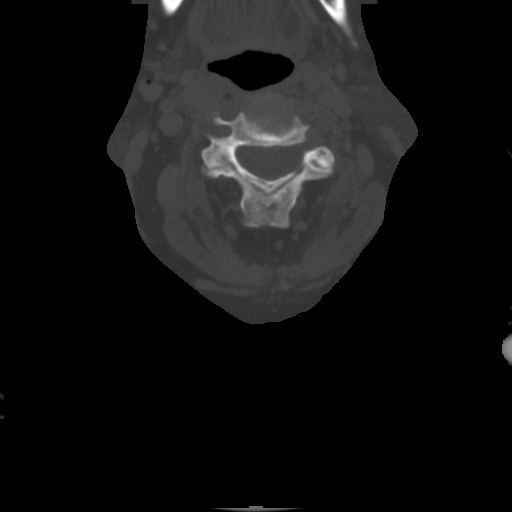
[im 84/123  brain]
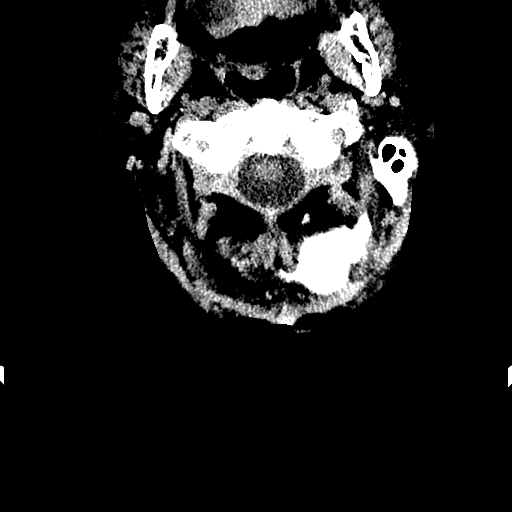
[im 100/123  brain]
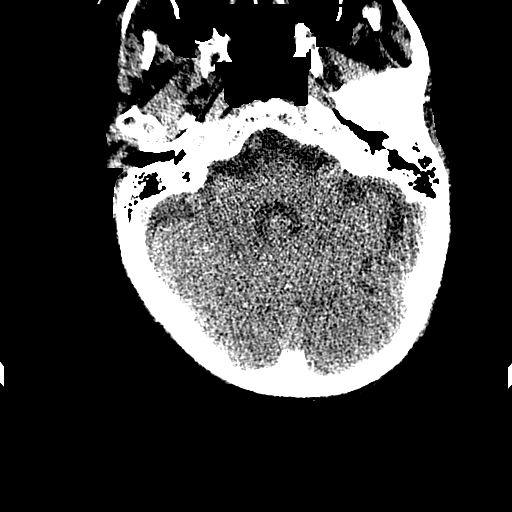
[im 115/123  brain]
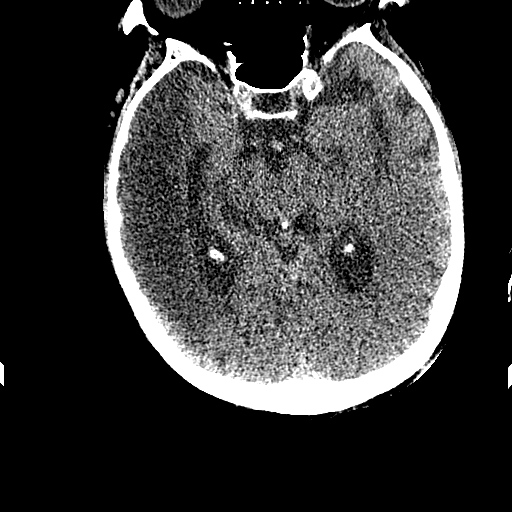

[Series 304: coronal, idose (2) · coronal · 0.34mm/px · 3 of 100 slices shown]
[im 20/100  brain]
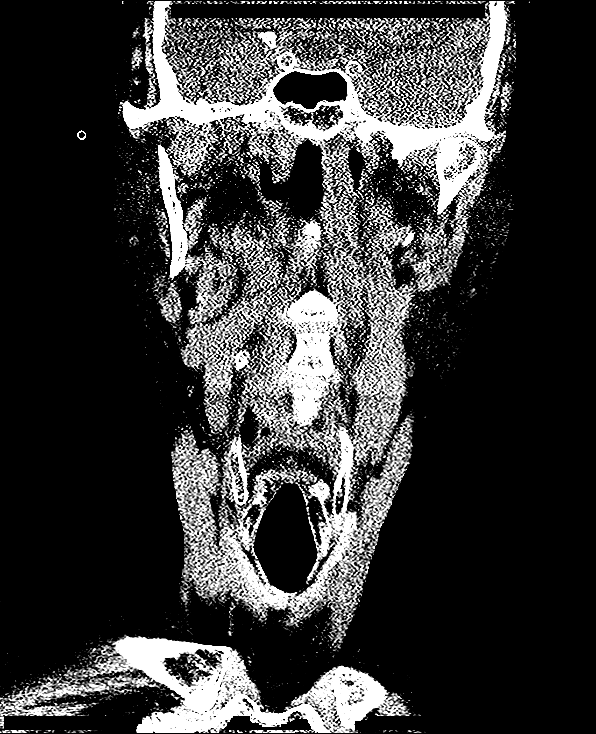
[im 40/100  brain]
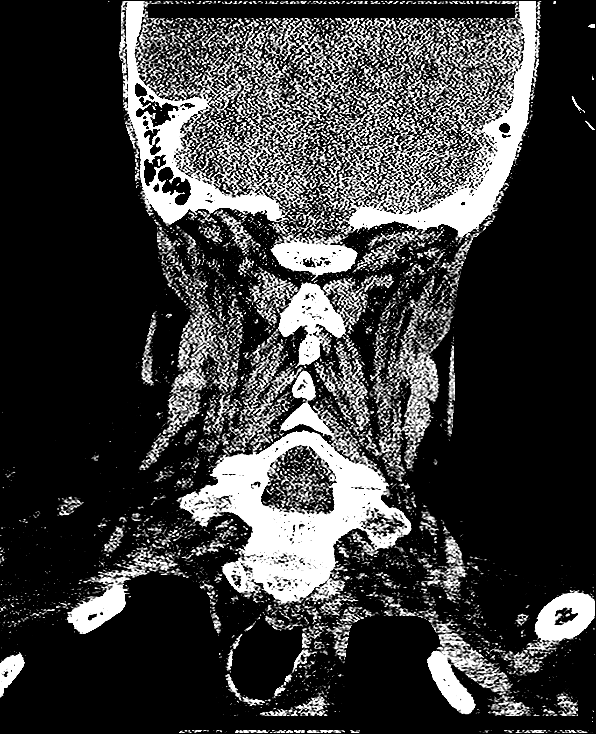
[im 60/100  brain]
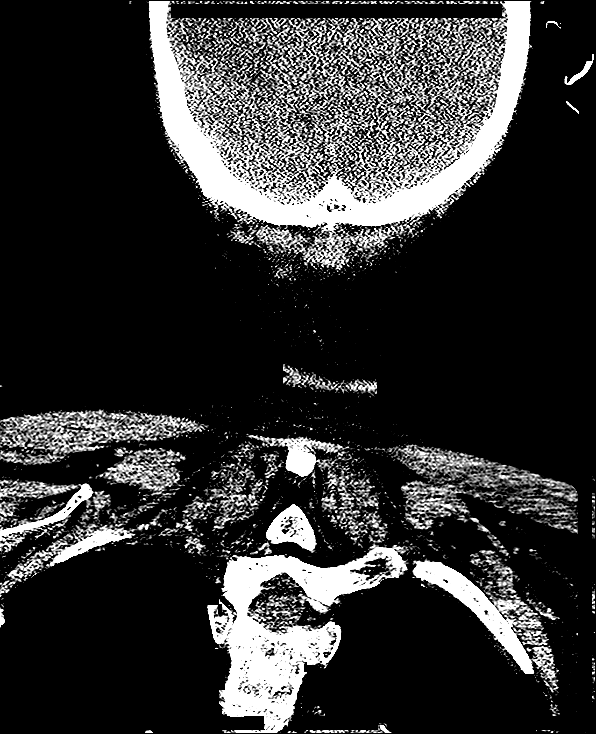

[Series 305: sagittal, idose (2) · sagittal · 0.34mm/px · 2 of 100 slices shown]
[im 34/100  brain]
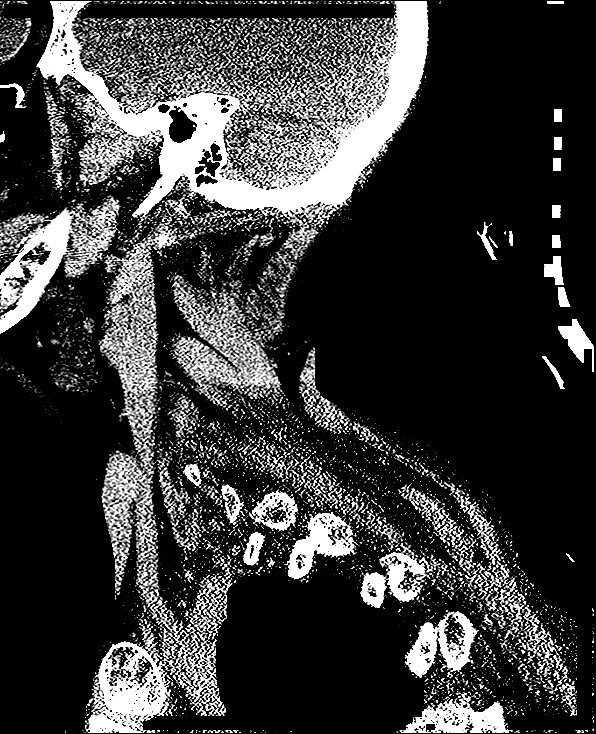
[im 67/100  brain]
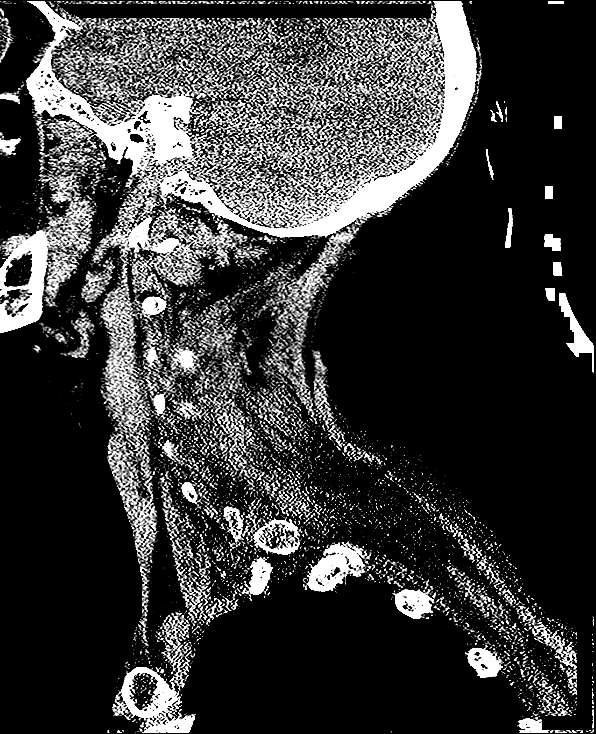

[13 of 47 positions shown; findings below may reference images not displayed]

FINDINGS: CT HEAD FINDINGS

Brain: Extensive right MCA territory encephalomalacia re -
demonstrated and largely related to the June 2016 infarct.
Superimposed chronic posterior left MCA territory encephalomalacia.
Stable cerebral volume. Stable gray-white matter differentiation
throughout the brain. No acute intracranial hemorrhage identified.
No midline shift, mass effect, or evidence of intracranial mass
lesion. No ventriculomegaly. No new cortically based infarct
identified.

Vascular: Calcified atherosclerosis at the skull base.

Skull: Intact.  No acute osseous abnormality identified.

Sinuses/Orbits: Visualized paranasal sinuses and mastoids are stable
and well pneumatized.

Other: No acute orbit or scalp soft tissue finding.

CT CERVICAL SPINE FINDINGS

Alignment: Improved cervical lordosis compared to [place].
Cervicothoracic junction alignment is within normal limits.
Bilateral posterior element alignment is within normal limits.

Skull base and vertebrae: Visualized skull base is intact. No
atlanto-occipital dissociation. No acute cervical spine fracture
identified.

Soft tissues and spinal canal: No prevertebral fluid or swelling. No
visible canal hematoma.

Negative noncontrast neck soft tissues.

Disc levels: Mild degenerative cervical spinal stenosis suspected at
C3-C4 in part related to broad-based disc (series 302, image 62).

Upper chest: Visible upper thoracic levels appear intact. Negative
lung apices. Negative visualized superior noncontrast mediastinum.
IMPRESSION: 1. No acute intracranial abnormality. Stable noncontrast CT
appearance of the brain with extensive right worse than left chronic
MCA territory ischemia.
2.  No acute fracture or listhesis identified in the cervical spine.
3. Mild degenerative cervical spinal stenosis suspected at C3-C4.
